# Patient Record
Sex: Male | Born: 1955
Health system: Southern US, Community
[De-identification: ages and names within clinical notes are randomized; demographics above are authoritative.]

## PROBLEM LIST (undated history)

## (undated) ENCOUNTER — Emergency Department (HOSPITAL_COMMUNITY): Admission: EM | Payer: Self-pay | Source: Home / Self Care

## (undated) DIAGNOSIS — Z973 Presence of spectacles and contact lenses: Secondary | ICD-10-CM

## (undated) DIAGNOSIS — E785 Hyperlipidemia, unspecified: Secondary | ICD-10-CM

## (undated) DIAGNOSIS — R5383 Other fatigue: Secondary | ICD-10-CM

## (undated) DIAGNOSIS — I472 Ventricular tachycardia, unspecified: Secondary | ICD-10-CM

## (undated) DIAGNOSIS — N186 End stage renal disease: Secondary | ICD-10-CM

## (undated) DIAGNOSIS — H269 Unspecified cataract: Secondary | ICD-10-CM

## (undated) DIAGNOSIS — Z87442 Personal history of urinary calculi: Secondary | ICD-10-CM

## (undated) DIAGNOSIS — I4729 Other ventricular tachycardia: Secondary | ICD-10-CM

## (undated) DIAGNOSIS — C61 Malignant neoplasm of prostate: Secondary | ICD-10-CM

## (undated) DIAGNOSIS — N2581 Secondary hyperparathyroidism of renal origin: Secondary | ICD-10-CM

## (undated) DIAGNOSIS — R0600 Dyspnea, unspecified: Secondary | ICD-10-CM

## (undated) DIAGNOSIS — I498 Other specified cardiac arrhythmias: Secondary | ICD-10-CM

## (undated) DIAGNOSIS — I428 Other cardiomyopathies: Secondary | ICD-10-CM

## (undated) DIAGNOSIS — G4733 Obstructive sleep apnea (adult) (pediatric): Secondary | ICD-10-CM

## (undated) DIAGNOSIS — J189 Pneumonia, unspecified organism: Secondary | ICD-10-CM

## (undated) DIAGNOSIS — I10 Essential (primary) hypertension: Secondary | ICD-10-CM

## (undated) DIAGNOSIS — Z9989 Dependence on other enabling machines and devices: Principal | ICD-10-CM

## (undated) DIAGNOSIS — R0902 Hypoxemia: Secondary | ICD-10-CM

## (undated) HISTORY — PX: COLONOSCOPY: SHX174

## (undated) HISTORY — PX: APPENDECTOMY: SHX54

## (undated) HISTORY — DX: Unspecified cataract: H26.9

## (undated) HISTORY — DX: Obstructive sleep apnea (adult) (pediatric): G47.33

## (undated) HISTORY — PX: CYSTOSCOPY W/ STONE MANIPULATION: SHX1427

## (undated) HISTORY — PX: TONSILLECTOMY: SUR1361

## (undated) HISTORY — DX: Hypoxemia: R09.02

## (undated) HISTORY — DX: Secondary hyperparathyroidism of renal origin: N25.81

## (undated) HISTORY — DX: Hyperlipidemia, unspecified: E78.5

## (undated) HISTORY — DX: Essential (primary) hypertension: I10

## (undated) HISTORY — DX: End stage renal disease: N18.6

## (undated) HISTORY — PX: HERNIA REPAIR: SHX51

## (undated) HISTORY — DX: Dependence on other enabling machines and devices: Z99.89

---

## 1999-02-13 ENCOUNTER — Emergency Department (HOSPITAL_COMMUNITY): Admission: EM | Admit: 1999-02-13 | Discharge: 1999-02-14 | Payer: Self-pay | Admitting: *Deleted

## 1999-12-20 ENCOUNTER — Encounter: Payer: Self-pay | Admitting: *Deleted

## 1999-12-20 ENCOUNTER — Ambulatory Visit (HOSPITAL_COMMUNITY): Admission: RE | Admit: 1999-12-20 | Discharge: 1999-12-20 | Payer: Self-pay | Admitting: *Deleted

## 2001-08-23 ENCOUNTER — Encounter: Payer: Self-pay | Admitting: Emergency Medicine

## 2001-08-23 ENCOUNTER — Emergency Department (HOSPITAL_COMMUNITY): Admission: EM | Admit: 2001-08-23 | Discharge: 2001-08-23 | Payer: Self-pay | Admitting: Emergency Medicine

## 2002-12-06 ENCOUNTER — Ambulatory Visit (HOSPITAL_COMMUNITY): Admission: RE | Admit: 2002-12-06 | Discharge: 2002-12-06 | Payer: Self-pay | Admitting: Family Medicine

## 2002-12-06 ENCOUNTER — Encounter: Payer: Self-pay | Admitting: Family Medicine

## 2003-12-24 ENCOUNTER — Ambulatory Visit (HOSPITAL_COMMUNITY): Admission: RE | Admit: 2003-12-24 | Discharge: 2003-12-24 | Payer: Self-pay | Admitting: Family Medicine

## 2003-12-24 IMAGING — CR DG CHEST 2V
2 series · 2 of 2 positions shown · non-contrast
Comparison: none

CLINICAL DATA: Chest wall pain. 
 PA AND LATERAL CHEST, [DATE]
 The lungs are clear.  Cardiac and mediastinal contours are normal.  Osseous structures unremarkable. 
 IMPRESSION
 Negative for acute cardiac or pulmonary process.

[view not recorded (1 of 2)]
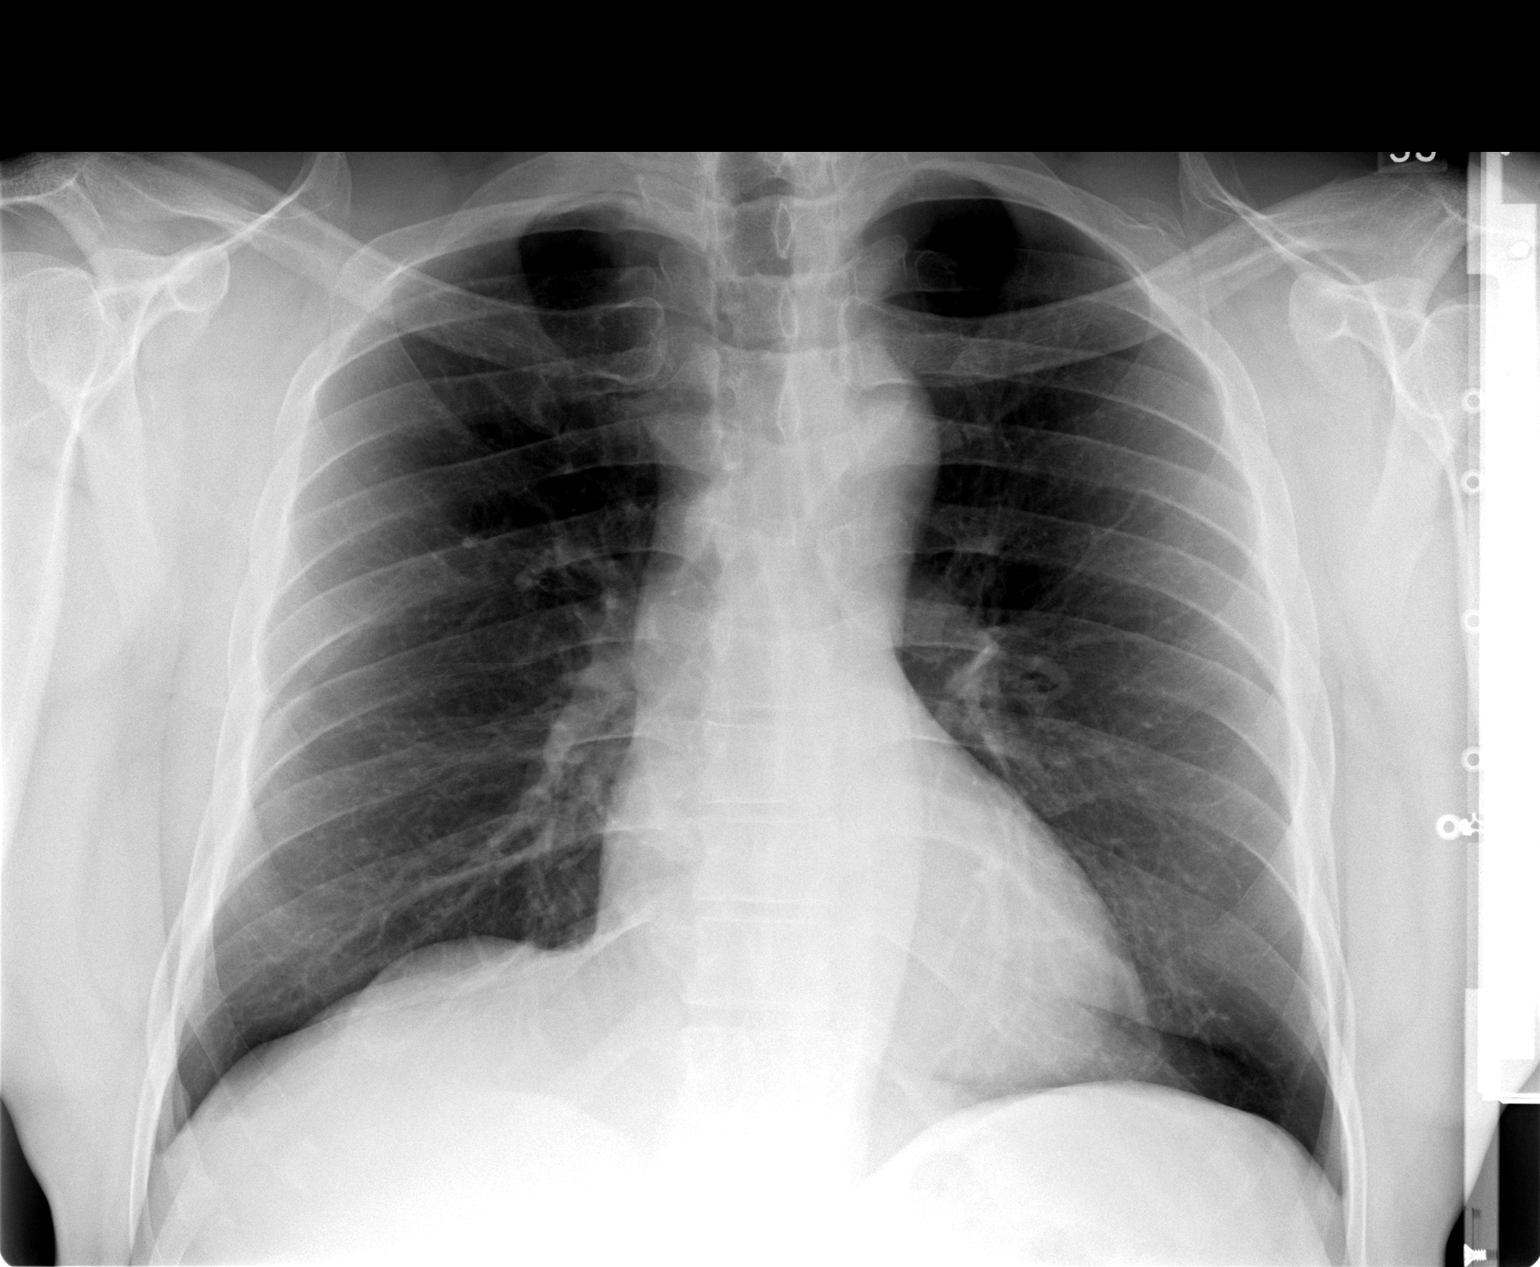

[view not recorded (2 of 2)]
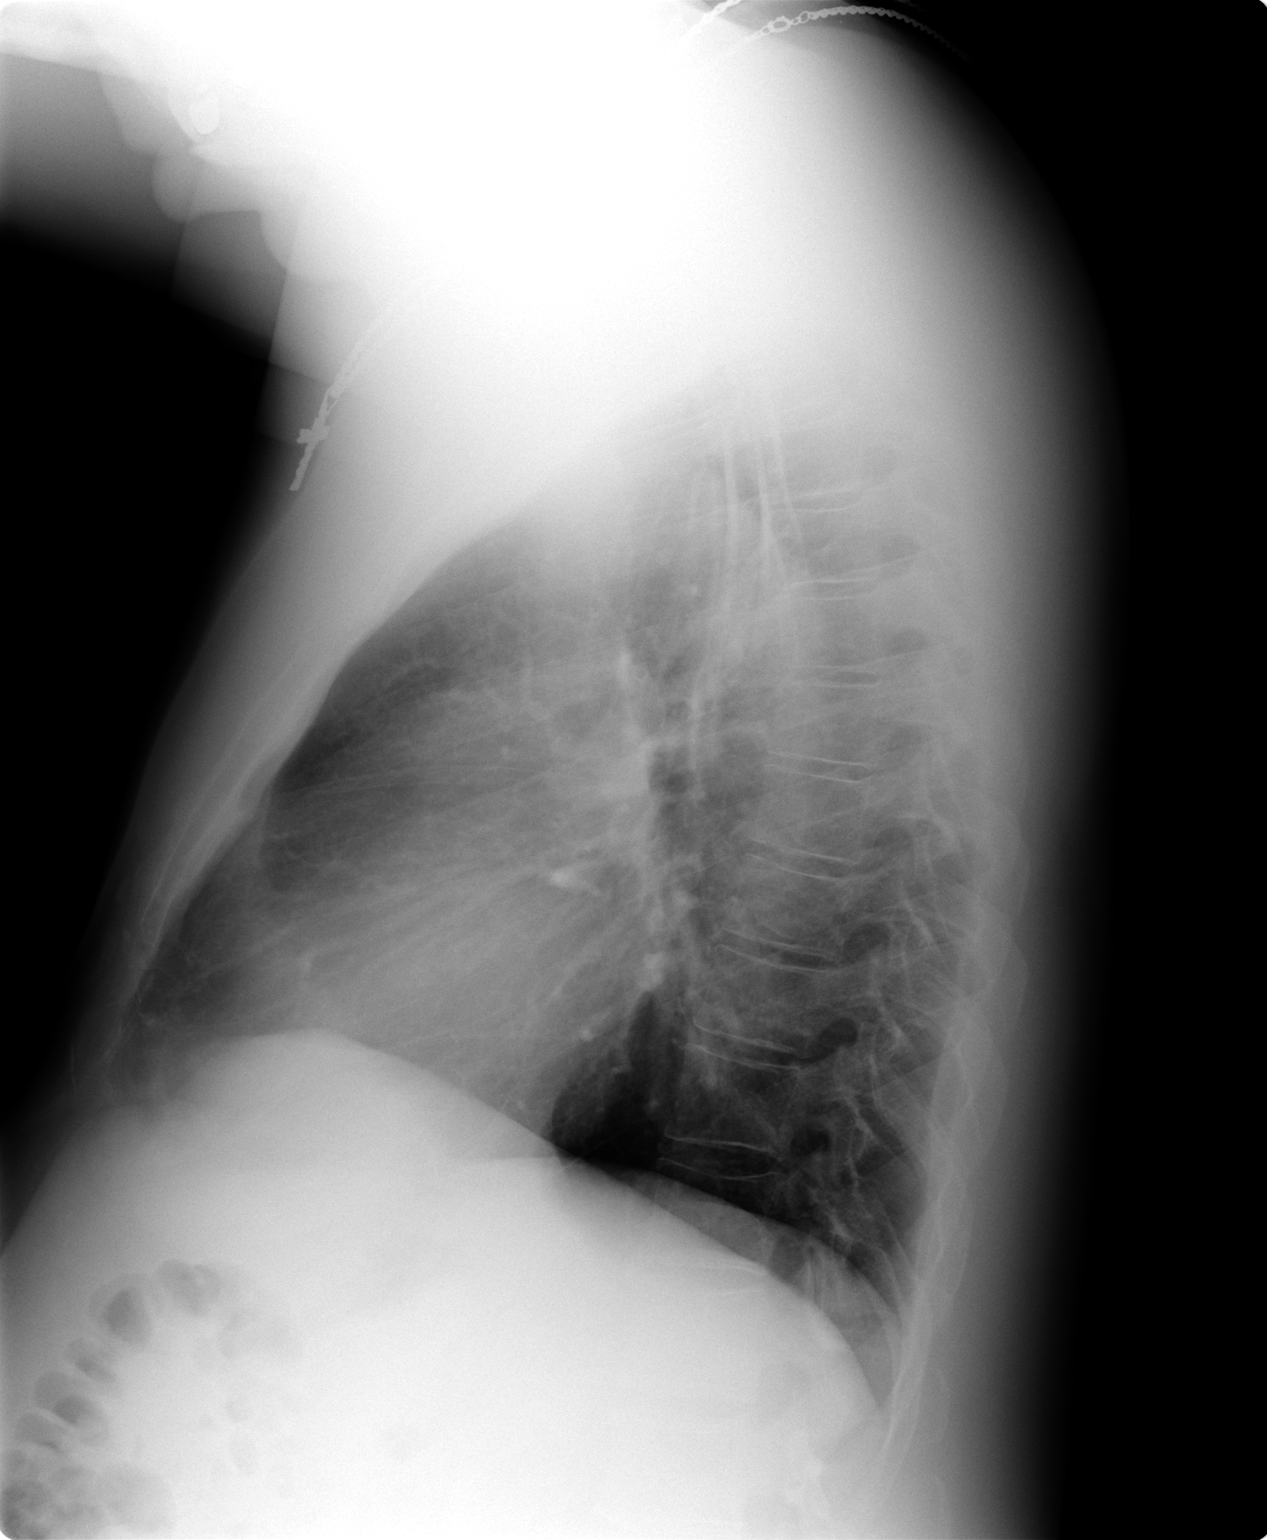

[2 of 2 positions shown; findings below may reference images not displayed]

## 2005-02-20 ENCOUNTER — Ambulatory Visit (HOSPITAL_COMMUNITY): Admission: RE | Admit: 2005-02-20 | Discharge: 2005-02-20 | Payer: Self-pay | Admitting: Internal Medicine

## 2008-04-12 ENCOUNTER — Emergency Department (HOSPITAL_COMMUNITY): Admission: EM | Admit: 2008-04-12 | Discharge: 2008-04-12 | Payer: Self-pay | Admitting: Emergency Medicine

## 2008-08-15 ENCOUNTER — Encounter (INDEPENDENT_AMBULATORY_CARE_PROVIDER_SITE_OTHER): Payer: Self-pay | Admitting: Internal Medicine

## 2008-08-15 ENCOUNTER — Observation Stay (HOSPITAL_COMMUNITY): Admission: AD | Admit: 2008-08-15 | Discharge: 2008-08-16 | Payer: Self-pay | Admitting: Internal Medicine

## 2008-08-15 ENCOUNTER — Ambulatory Visit: Payer: Self-pay | Admitting: Diagnostic Radiology

## 2008-08-15 ENCOUNTER — Encounter: Payer: Self-pay | Admitting: Emergency Medicine

## 2008-08-15 IMAGING — CT CT HEAD W/O CM
1 series · 16 of 30 positions shown, 20 images · non-contrast
Comparison: None

CLINICAL DATA: Chest pain.  Syncope.  Hit head posteriorly.

CT HEAD WITHOUT CONTRAST
TECHNIQUE: Contiguous axial images were obtained from the base of
the skull through the vertex without contrast.

[Series 2: head 4.8 h37s · axial · 0.44mm/px · z∈[-134,+2]mm · 16 of 32 slices shown, 20 images]
[im 2/32  brain]
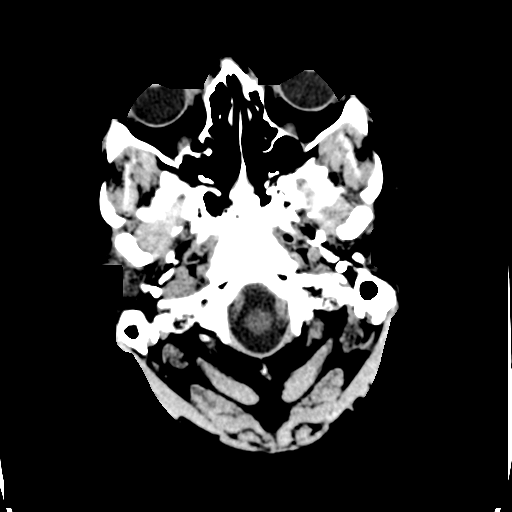
[im 2/32  bone]
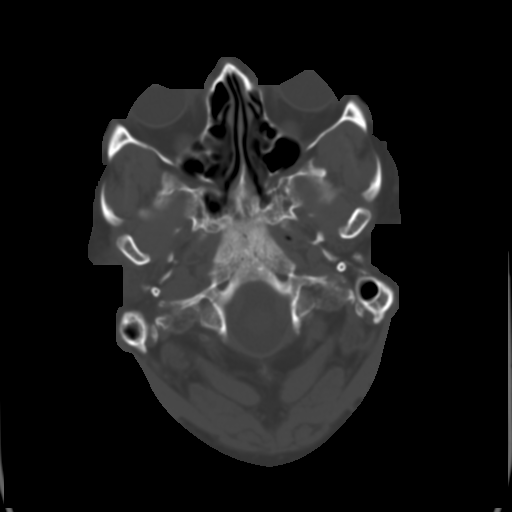
[im 4/32  brain]
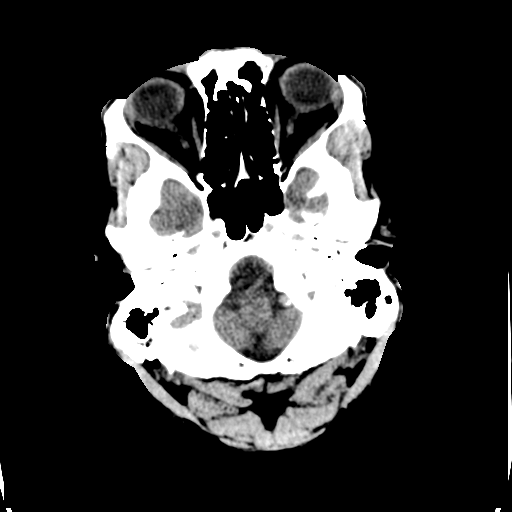
[im 6/32  brain]
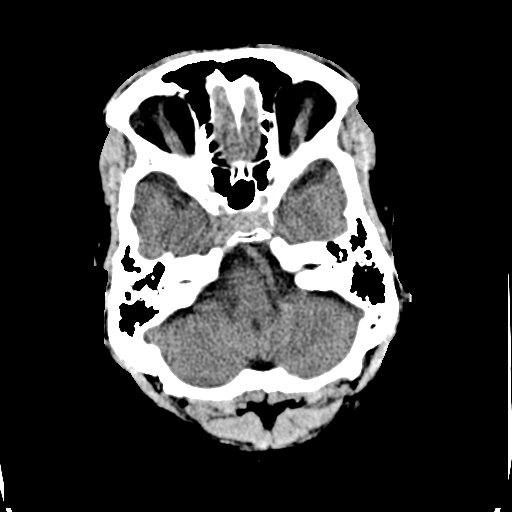
[im 8/32  brain]
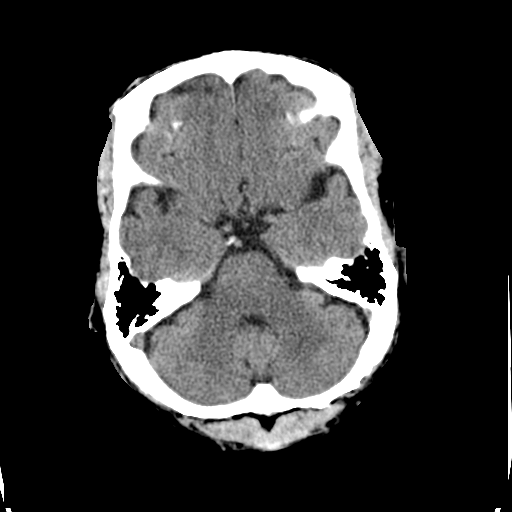
[im 9/32  brain]
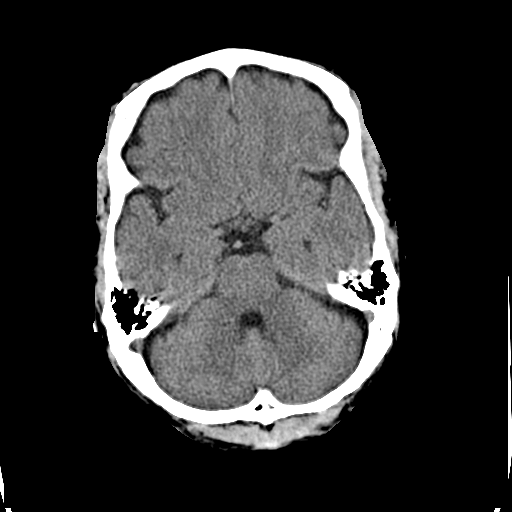
[im 9/32  bone]
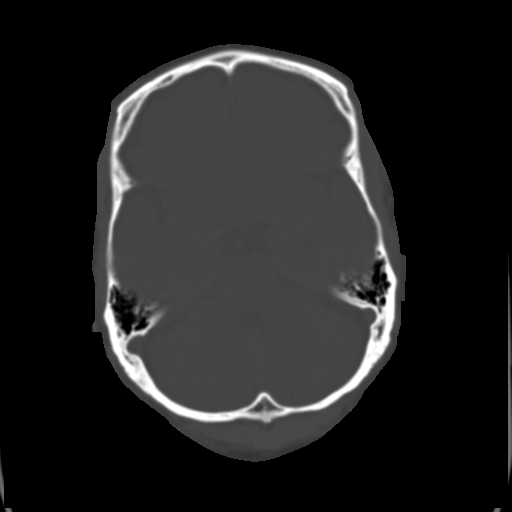
[im 11/32  brain]
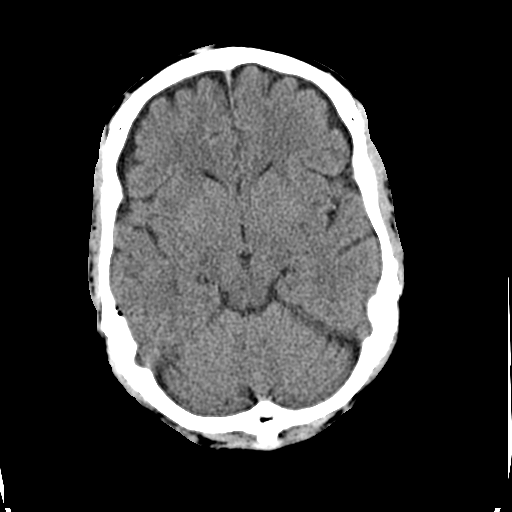
[im 13/32  brain]
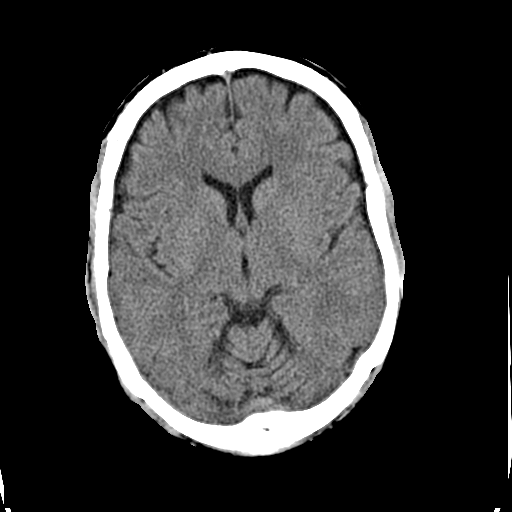
[im 15/32  brain]
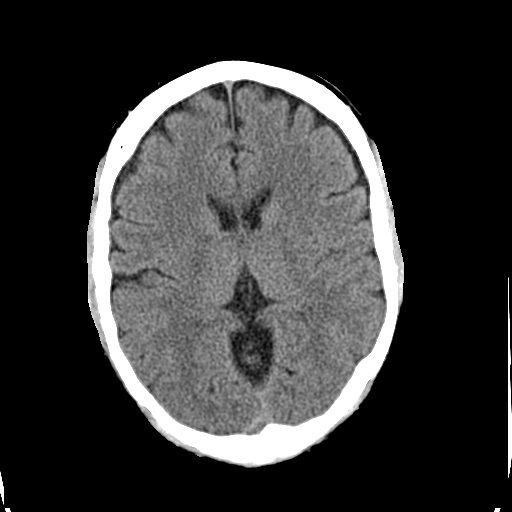
[im 17/32  brain]
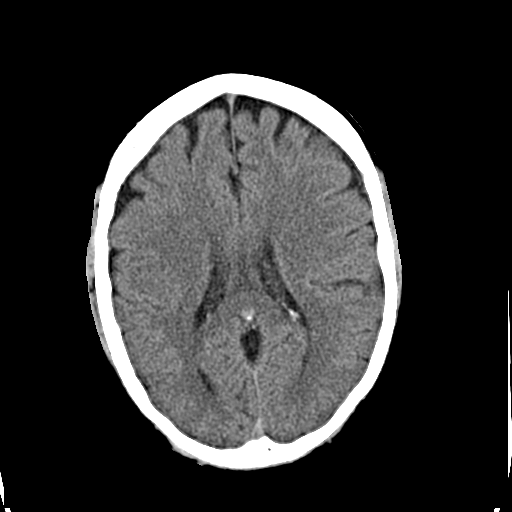
[im 17/32  bone]
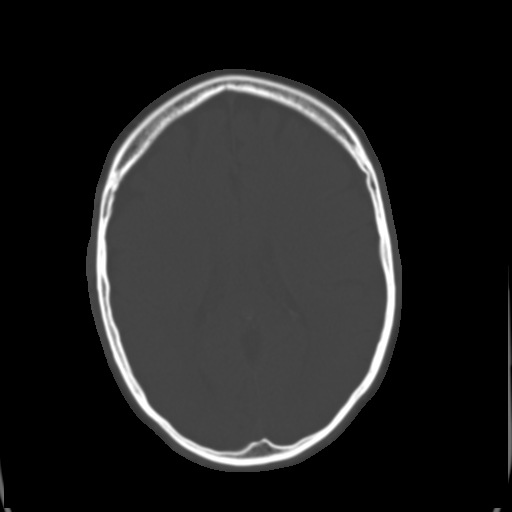
[im 19/32  brain]
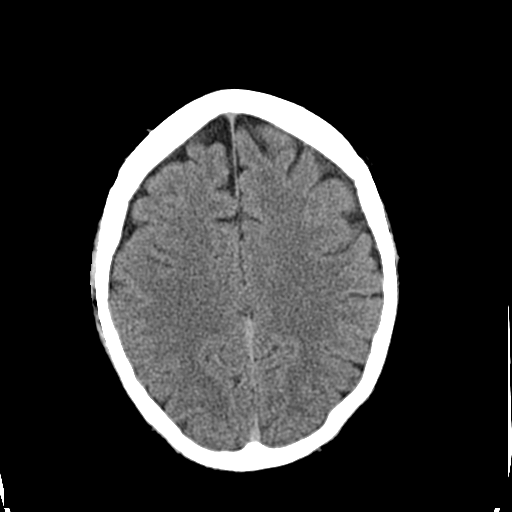
[im 21/32  brain]
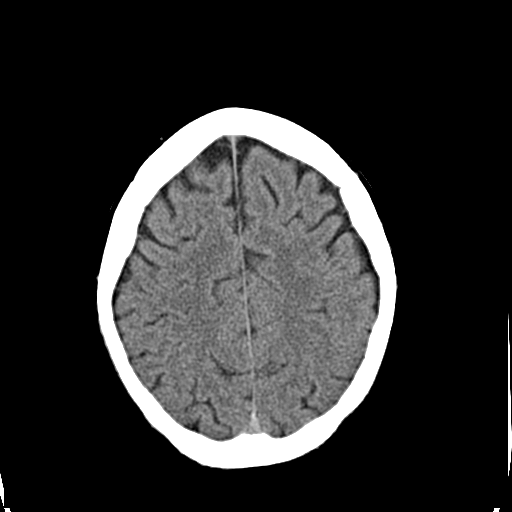
[im 23/32  brain]
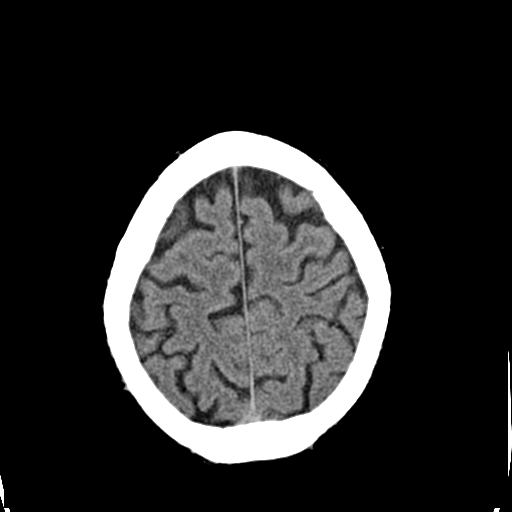
[im 24/32  brain]
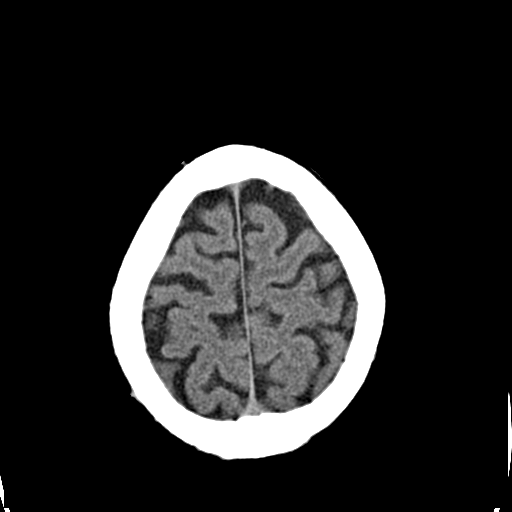
[im 24/32  bone]
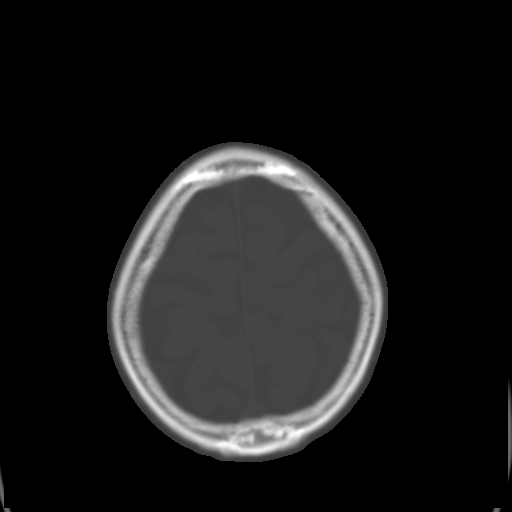
[im 26/32  brain]
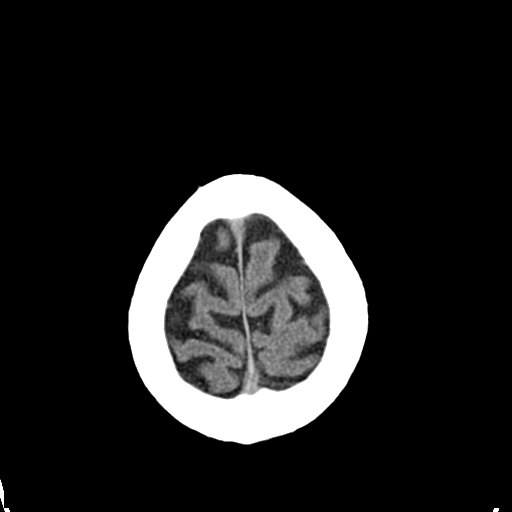
[im 28/32  brain]
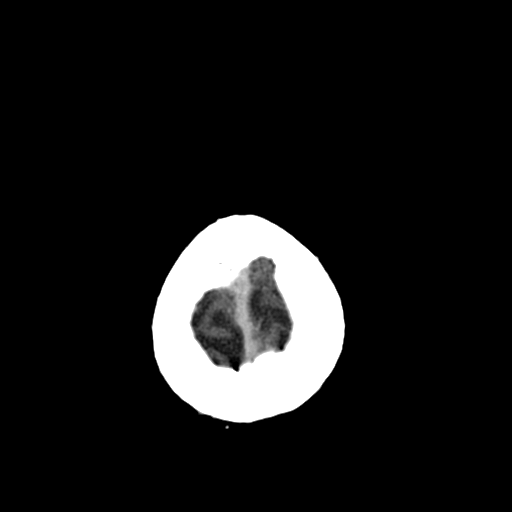
[im 30/32  brain]
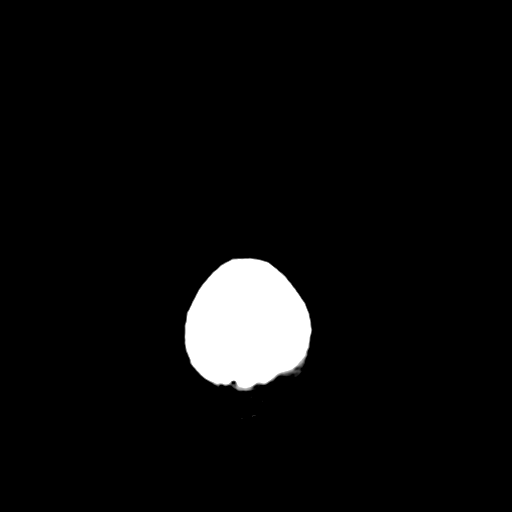

[16 of 30 positions shown; findings below may reference images not displayed]

FINDINGS: There is mild atrophy. No acute intracranial abnormality.
Specifically, no hemorrhage, hydrocephalus, mass lesion, acute
infarction, or significant intracranial injury.  No acute calvarial
abnormality.

Visualized paranasal sinuses and mastoids clear.  Orbital soft
tissues unremarkable.
IMPRESSION: No acute intracranial abnormality.

## 2010-04-05 HISTORY — PX: CARDIAC CATHETERIZATION: SHX172

## 2010-08-11 LAB — POCT CARDIAC MARKERS
CKMB, poc: 2.5 ng/mL (ref 1.0–8.0)
Myoglobin, poc: 401 ng/mL (ref 12–200)
Troponin i, poc: <0.05 ng/mL (ref 0.00–0.09)

## 2010-08-11 LAB — BASIC METABOLIC PANEL
BUN: 22 mg/dL (ref 6–23)
BUN: 27 mg/dL — ABNORMAL HIGH (ref 6–23)
CO2: 27 mEq/L (ref 19–32)
CO2: 25 mEq/L (ref 19–32)
Calcium: 9.8 mg/dL (ref 8.4–10.5)
Calcium: 10.2 mg/dL (ref 8.4–10.5)
Chloride: 106 mEq/L (ref 96–112)
Chloride: 104 mEq/L (ref 96–112)
Creatinine, Ser: 2.46 mg/dL — ABNORMAL HIGH (ref 0.4–1.5)
Creatinine, Ser: 2.9 mg/dL — ABNORMAL HIGH (ref 0.4–1.5)
GFR calc Af Amer: 34 mL/min — ABNORMAL LOW (ref >60)
GFR calc Af Amer: 28 mL/min — ABNORMAL LOW (ref >60)
GFR calc non Af Amer: 28 mL/min — ABNORMAL LOW (ref >60)
GFR calc non Af Amer: 23 mL/min — ABNORMAL LOW (ref >60)
Glucose, Bld: 91 mg/dL (ref 70–99)
Glucose, Bld: 85 mg/dL (ref 70–99)
Potassium: 4.4 mEq/L (ref 3.5–5.1)
Potassium: 4.5 mEq/L (ref 3.5–5.1)
Sodium: 138 mEq/L (ref 135–145)
Sodium: 142 mEq/L (ref 135–145)

## 2010-08-11 LAB — CARDIAC PANEL(CRET KIN+CKTOT+MB+TROPI)
CK, MB: 2.6 ng/mL (ref 0.3–4.0)
CK, MB: 2.9 ng/mL (ref 0.3–4.0)
CK, MB: 4.3 ng/mL — ABNORMAL HIGH (ref 0.3–4.0)
Relative Index: 0.7 (ref 0.0–2.5)
Relative Index: 0.7 (ref 0.0–2.5)
Relative Index: 0.8 (ref 0.0–2.5)
Total CK: 369 U/L — ABNORMAL HIGH (ref 7–232)
Total CK: 400 U/L — ABNORMAL HIGH (ref 7–232)
Total CK: 544 U/L — ABNORMAL HIGH (ref 7–232)
Troponin I: 0.02 ng/mL (ref 0.00–0.06)
Troponin I: 0.02 ng/mL (ref 0.00–0.06)
Troponin I: 0.04 ng/mL (ref 0.00–0.06)

## 2010-08-11 LAB — CBC
HCT: 41.9 % (ref 39.0–52.0)
HCT: 41.8 % (ref 39.0–52.0)
Hemoglobin: 14.2 g/dL (ref 13.0–17.0)
Hemoglobin: 14.2 g/dL (ref 13.0–17.0)
MCHC: 33.9 g/dL (ref 30.0–36.0)
MCHC: 33.9 g/dL (ref 30.0–36.0)
MCV: 87.8 fL (ref 78.0–100.0)
MCV: 86.6 fL (ref 78.0–100.0)
Platelets: 172 10*3/uL (ref 150–400)
Platelets: 193 10*3/uL (ref 150–400)
RBC: 4.77 MIL/uL (ref 4.22–5.81)
RBC: 4.83 MIL/uL (ref 4.22–5.81)
RDW: 13.6 % (ref 11.5–15.5)
RDW: 12.9 % (ref 11.5–15.5)
WBC: 6 10*3/uL (ref 4.0–10.5)
WBC: 6.4 10*3/uL (ref 4.0–10.5)

## 2010-08-11 LAB — URINALYSIS, ROUTINE W REFLEX MICROSCOPIC
Bilirubin Urine: NEGATIVE
Glucose, UA: NEGATIVE mg/dL
Hgb urine dipstick: NEGATIVE
Ketones, ur: NEGATIVE mg/dL
Leukocytes, UA: NEGATIVE
Nitrite: NEGATIVE
Protein, ur: 30 mg/dL — AB
Specific Gravity, Urine: 1.011 (ref 1.005–1.030)
Urobilinogen, UA: 0.2 mg/dL (ref 0.0–1.0)
pH: 6.5 (ref 5.0–8.0)

## 2010-08-11 LAB — URINE MICROSCOPIC-ADD ON

## 2010-08-11 LAB — POCT TOXICOLOGY PANEL

## 2010-08-11 LAB — DIFFERENTIAL
Basophils Absolute: 0 10*3/uL (ref 0.0–0.1)
Basophils Relative: 1 % (ref 0–1)
Eosinophils Absolute: 0.3 10*3/uL (ref 0.0–0.7)
Eosinophils Relative: 5 % (ref 0–5)
Lymphocytes Relative: 32 % (ref 12–46)
Lymphs Abs: 2 10*3/uL (ref 0.7–4.0)
Monocytes Absolute: 0.5 10*3/uL (ref 0.1–1.0)
Monocytes Relative: 8 % (ref 3–12)
Neutro Abs: 3.6 10*3/uL (ref 1.7–7.7)
Neutrophils Relative %: 55 % (ref 43–77)

## 2010-08-11 LAB — D-DIMER, QUANTITATIVE: D-Dimer, Quant: 0.33 ug/mL-FEU (ref 0.00–0.48)

## 2010-09-14 NOTE — H&P (Signed)
NAME:  Michael [name], HOLEMAN NO.:  [id number]   MEDICAL RECORD NO.:  [id number]          PATIENT TYPE:  INP   LOCATION:  [id number]                         FACILITY:  [facility]   PHYSICIAN:  Annita Brod, M.D.DATE OF BIRTH:  [birth year]   DATE OF ADMISSION:  08/15/2008  DATE OF DISCHARGE:                              HISTORY & PHYSICAL   PATIENT'S PCP:  Audree Camel. Alyson Ingles, M.D.   CHIEF COMPLAINT:  Chest pain and syncope.   HISTORY OF PRESENT ILLNESS:  The patient is a 55 year old African  American male with past medical history of hypertension, chronic renal  insufficiency and hyperlipidemia who has been in a good state of health.  No previous chest pain or cardiac issues when today he was sitting at  work, no physical activity and all of a sudden he started having some  what he described as a chest pressure that was located over is left  breast.  Essentially, no other radiation, nontender.  He had no  associated shortness of breath, but he did feel very lightheaded and  broke out into a sweat.  He got up and walked into another room and then  sat down in the other room at work.  The next thing he knew he said he  passed out he did not have any warning that he was eating lightheaded  was going to pass out.  He said he was sitting in the room and he  started having a little chest pressure and then came to on the floor.  According to coworkers he was out for a few seconds.  There was no  report of any shaking like activity suggesting seizure.  He said that  when he woke up his chest pain was completely gone and has been gone  ever since.  With these findings, obviously see the patient was then  sent over to the emergency room.  In the emergency room the patient was  evaluated.  He was found to have normal cardiac markers.  His blood  pressure was stable at 135/92, heart rate 80, respirations 14, O2 SAT  100% on room air.  Labs were drawn.  He was found to have a BUN at 27  and  creatinine of 2.9 and it was slightly up from his baseline.  A white  count of 6.4, but no shift.  The rest of his labs were unremarkable with  normal urine drug screen and the patient's EKG was reportedly  unremarkable, except for some PVCs.  However, I do not see on it the  chart.  His chest x-ray was reportedly unremarkable as well.  D-dimer  within normal limits.  The patient was assessed further and given his  syncope and chest pain it was felt best that he come in for further  evaluation and treatment.  He was transferred over from the [place] to [facility].   Past Medical History:  1.Hypertension  2.Hyperlipidemia  3.Chronic Renal insufficiency   Medications:  1.Micardis 80mg  QHS  1. Metoprolol 100mg  daily  2. Spironolactone 25mg  daily  3. Simvastatin 40mg   daily  4. Norvasc 5mg  daily  5. Zemplar 41mcg daily   Allergies:  Lipitor (rash) althoguh he is on Zocor   Social history:  No tobacco, alcohol or drug use.   FAMILY HISTORY:  His mom did have a CABG.   When I saw the patient he was doing okay, alert and oriented x3 in no  apparent distress.  HEENT:  Normocephalic atraumatic.  Mucous membranes are moist.  He has no carotid bruits.  HEART:  Regular rate and rhythm, S1 and S2.  LUNGS:  Clear to auscultation bilaterally.  ABDOMEN:  Soft, nontender, nondistended.  Positive bowel sounds.  EXTREMITIES:  Show no clubbing, cyanosis or edema.   LAB WORK:  His CT scan of his head shows no acute intracranial  abnormality.  Reportedly according to the ER note, his 12-lead EKG  initially showed normal sinus rhythm.  Second EKG showed occasional  PVCs.  CPK 401, MB 2.5, troponin-I less than 0.05.  Urinalysis  completely normal only noting rare bacteria, 0-2 white cells.  White  count 6.4, H and H of  14 and 42, MCV of 87, platelet count 193, no  shift.  Sodium 142, potassium 4.5, chloride 104, bicarb 25, BUN 27,  creatinine 2.9, glucose 85.  Urine  drug screen negative.  D-dimer 0.33  which is in the normal range.   ASSESSMENT/PLAN:  1. Chest pain.  The patient does have a number of risk factors      including hypertension, hyperlipidemia, slightly overweight.  We      will plan to check serial sets of cardiac markers, leave on      telemetry and we will possibly get an outpatient stress test.  We      will also check a 2-D echo with [facility] to read.  2. Syncope.  It is difficult to say the etiology of it.  Continue to      monitor telemetry.  The rest of his labs essentially unremarkable.      It is possible that with this episode of chest discomfort he became      slightly dehydrated and then when he stood and then sat back down      he became orthostatic and passed out very briefly.  He was given IV      fluids on route and currently his orthostatics are normal upon      arrival to [facility].  3. Hypertension.  Holding meds for now and continue to follow.  4. Chronic renal insufficiency.  A slight increase from baseline.  We      will follow.  5. History of hyperlipidemia.  Stable.      Annita Brod, M.D.  Electronically Signed     SKK/MEDQ  D:  08/15/2008  T:  08/15/2008  Job:  [id number]   cc:   Herbie Baltimore A. Alyson Ingles, M.D.  Fax: [phone]

## 2011-04-06 DIAGNOSIS — I10 Essential (primary) hypertension: Secondary | ICD-10-CM | POA: Insufficient documentation

## 2011-11-07 ENCOUNTER — Other Ambulatory Visit: Payer: Self-pay

## 2011-11-07 DIAGNOSIS — Z01810 Encounter for preprocedural cardiovascular examination: Secondary | ICD-10-CM

## 2011-11-07 DIAGNOSIS — N184 Chronic kidney disease, stage 4 (severe): Secondary | ICD-10-CM

## 2011-11-14 ENCOUNTER — Encounter: Payer: Self-pay | Admitting: Vascular Surgery

## 2011-11-24 ENCOUNTER — Encounter: Payer: Self-pay | Admitting: Vascular Surgery

## 2011-11-25 ENCOUNTER — Ambulatory Visit (INDEPENDENT_AMBULATORY_CARE_PROVIDER_SITE_OTHER): Payer: BC Managed Care – PPO | Admitting: Vascular Surgery

## 2011-11-25 ENCOUNTER — Encounter: Payer: Self-pay | Admitting: Vascular Surgery

## 2011-11-25 ENCOUNTER — Encounter: Payer: Self-pay | Admitting: *Deleted

## 2011-11-25 ENCOUNTER — Other Ambulatory Visit: Payer: Self-pay | Admitting: *Deleted

## 2011-11-25 ENCOUNTER — Encounter (INDEPENDENT_AMBULATORY_CARE_PROVIDER_SITE_OTHER): Payer: BC Managed Care – PPO | Admitting: *Deleted

## 2011-11-25 VITALS — BP 143/94 | HR 65 | Resp 18 | Ht 69.0 in | Wt 192.5 lb

## 2011-11-25 DIAGNOSIS — N184 Chronic kidney disease, stage 4 (severe): Secondary | ICD-10-CM

## 2011-11-25 DIAGNOSIS — Z01810 Encounter for preprocedural cardiovascular examination: Secondary | ICD-10-CM

## 2011-11-25 NOTE — Progress Notes (Signed)
[facility] [place]  Referred by:  Louis Meckel, MD [place], [place] [id number]  Reason for referral: New access  History of Present Illness  Michael [name] is a 56 y.o. (1955/09/11) male who presents for evaluation for permanent access.  The patient is right hand dominant.  The patient has not had previous access procedures.  Previous central venous cannulation procedures include: none.  The patient has never had a PPM placed. Pt has been told he has ~15% residual kidney function.  Past Medical History  Diagnosis Date  . Chronic kidney disease     Polycystic kidney disease  . Hypertension   . Arthritis     gout  . Hyperparathyroidism, secondary renal     Past Surgical History  Procedure Date  . Appendectomy   . Cardiac catheterization 04-05-2010    History   Social History  . Marital Status: Single    Spouse Name: N/A    Number of Children: N/A  . Years of Education: N/A   Occupational History  . Not on file.   Social History Main Topics  . Smoking status: Never Smoker   . Smokeless tobacco: Not on file  . Alcohol Use: No  . Drug Use: No  . Sexually Active: Not on file   Other Topics Concern  . Not on file   Social History Narrative  . No narrative on file    Family History  Problem Relation Age of Onset  . Heart disease Mother   . Hyperlipidemia Mother   . Hypertension Mother   . Kidney disease Father   . Kidney disease Brother     Current Outpatient Prescriptions on File Prior to Visit  Medication Sig Dispense Refill  . allopurinol (ZYLOPRIM) 100 MG tablet Take 100 mg by mouth daily.      [name] amLODipine (NORVASC) 5 MG tablet Take 5 mg by mouth daily.      . metoprolol succinate (TOPROL-XL) 100 MG 24 hr tablet Take 100 mg by mouth daily. Take with or immediately following a meal.      . paricalcitol (ZEMPLAR) 1 MCG capsule Take 1 mcg by mouth daily.      . simvastatin (ZOCOR) 40 MG tablet Take 40 mg by mouth every  evening.      [name] spironolactone (ALDACTONE) 25 MG tablet Take 25 mg by mouth daily.      [name] telmisartan (MICARDIS) 80 MG tablet Take 80 mg by mouth daily.      . colchicine 0.6 MG tablet Take 0.6 mg by mouth as needed.        Allergies  Allergen Reactions  . Lipitor (Atorvastatin)     Leg pain  . Uloric (Febuxostat)     Leg pain    REVIEW OF SYSTEMS:  (Positives indicated with an "x", otherwise negative)  CARDIOVASCULAR: [ ]  chest pain    [ ]  chest pressure    [ ]  palpitations    [ ]  orthopnea   [ ]  dyspnea on exert. [ ]  claudication    [ ]  rest pain     [ ]  DVT     [ ]  phlebitis  PULMONARY:    [ ]  productive cough [ ]  asthma  [x]  wheezing  NEUROLOGIC:    [ x weakness    [ ]  paresthesias   [ ]  aphasia    [ ]  amaurosis    [ ]  dizziness  HEMATOLOGIC:    [ ]  bleeding problems  [ ]   clotting disorders  MUSCULOSKEL: [ ]  joint pain     [ ]  joint swelling  GASTROINTEST:  [ ]   blood in stool   [ ]   hematemesis  GENITOURINARY:   [ ]   dysuria    [ ]   hematuria  PSYCHIATRIC:   [ ]  history of major depression  INTEGUMENTARY: [x]  rashes    [ ]  ulcers  CONSTITUTIONAL:  [ ]  fever     [ ]  chills  Physical Examination  Filed Vitals:   11/25/11 1329  BP: 143/94  Pulse: 65  Resp: 18  Height: 5\' 9"  (1.753 m)  Weight: 192 lb 8 oz (87.317 kg)  SpO2: 99%   Body mass index is 28.43 kg/(m^2).  General: A&O x 3, WDWN  Head: [place]/AT  Ear/Nose/Throat: Hearing grossly intact, nares w/o erythema or drainage, oropharynx w/o Erythema/Exudate  Eyes: PERRLA, EOMI  Neck: Supple, no nuchal rigidity, no palpable LAD  Pulmonary: Sym exp, good air movt, CTAB, no rales, rhonchi, & wheezing  Cardiac: RRR, Nl S1, S2, no Murmurs, rubs or gallops  Vascular: Vessel Right Left  Radial Palpable Palpable  Brachial Palpable Palpable  Carotid Palpable, without bruit Palpable, without bruit  Aorta Non-palpable N/A  Femoral Palpable Palpable  Popliteal Non-palpable Non-palpable  PT Palpable Palpable    DP Palpable Palpable   Gastrointestinal: soft, NTND, -G/R, - HSM, - masses, - CVAT B  Musculoskeletal: M/S 5/5 throughout , Extremities without ischemic changes   Neurologic: CN 2-12 intact , Pain and light touch intact in extremities , Motor exam as listed above  Psychiatric: Judgment intact, Mood & affect appropriate for pt's clinical situation  Dermatologic: See M/S exam for extremity exam, no rashes otherwise noted  Lymph : No Cervical, Axillary, or Inguinal lymphadenopathy   Non-Invasive Vascular Imaging  Vein Mapping  (Date: 11/25/11):   R arm: acceptable vein conduits include entire cephalic and upper arm basilic  L arm: acceptable vein conduits include entire cephalic and upper arm basilic  Outside Studies/Documentation 5 pages of outside documents were reviewed including: nephrology clinic chart.  Medical Decision Making  Michael [name] is a 56 y.o. male who presents with chronic kidney disease stage IV   Based on vein mapping and examination, this patient's permanent access options include: left RC vs BC AVF  I had an extensive discussion with this patient in regards to the nature of access surgery, including risk, benefits, and alternatives.    The patient is aware that the risks of access surgery include but are not limited to: bleeding, infection, steal syndrome, nerve damage, ischemic monomelic neuropathy, failure of access to mature, and possible need for additional access procedures in the future.  The patient has agreed to proceed with the above procedure which will be scheduled 5 AUG 13.  Adele Barthel, MD [facility] [place] Office: [phone] Pager: [phone]  11/25/2011, 3:12 PM

## 2011-11-30 ENCOUNTER — Ambulatory Visit: Payer: Self-pay | Admitting: Vascular Surgery

## 2011-12-02 NOTE — Progress Notes (Signed)
Numerous calls made to patients home number- no answer.  I called work number and was told that he is due in at midnight. Unable to do SDW.

## 2011-12-02 NOTE — Procedures (Unsigned)
CEPHALIC VEIN MAPPING  INDICATION:  Chronic kidney disease stage IV.  HISTORY:  EXAM: The right cephalic vein is compressible with diameter measurements ranging from 0.23 to 0.65 cm.  The right basilic vein is compressible with diameter measurements ranging from 0.24 to 0.49 cm.  The left cephalic vein is compressible with diameter measurements ranging from 0.28 to 0.44 cm.  The left basilic vein is compressible with diameter measurements ranging from 0.35 to 0.45 cm.  See attached worksheet for all measurements.  IMPRESSION:  Patent bilateral cephalic and basilic veins with diameter measurements as described above.  ___________________________________________ Conrad Torrance, MD  CH/MEDQ  D:  11/28/2011  T:  11/28/2011  Job:  [id number]

## 2011-12-04 MED ORDER — DEXTROSE 5 % IV SOLN
1.5000 g | INTRAVENOUS | Status: AC
  Administered 2011-12-05: 1.5 g via INTRAVENOUS
  Filled 2011-12-04: qty 1.5

## 2011-12-05 ENCOUNTER — Ambulatory Visit (HOSPITAL_COMMUNITY): Payer: BC Managed Care – PPO | Admitting: Anesthesiology

## 2011-12-05 ENCOUNTER — Telehealth: Payer: Self-pay | Admitting: Vascular Surgery

## 2011-12-05 ENCOUNTER — Encounter (HOSPITAL_COMMUNITY)
Admission: RE | Disposition: A | Discharge status: Home / Self Care | Payer: Self-pay | Source: Ambulatory Visit | Attending: Vascular Surgery

## 2011-12-05 ENCOUNTER — Encounter (HOSPITAL_COMMUNITY): Payer: Self-pay | Admitting: Anesthesiology

## 2011-12-05 ENCOUNTER — Ambulatory Visit (HOSPITAL_COMMUNITY)
Admission: RE | Admit: 2011-12-05 | Discharge: 2011-12-05 | Disposition: A | Discharge status: Home / Self Care | Payer: BC Managed Care – PPO | Source: Ambulatory Visit | Attending: Vascular Surgery | Admitting: Vascular Surgery

## 2011-12-05 ENCOUNTER — Ambulatory Visit (HOSPITAL_COMMUNITY): Payer: BC Managed Care – PPO

## 2011-12-05 ENCOUNTER — Encounter (HOSPITAL_COMMUNITY): Payer: Self-pay | Admitting: *Deleted

## 2011-12-05 DIAGNOSIS — Q613 Polycystic kidney, unspecified: Secondary | ICD-10-CM | POA: Insufficient documentation

## 2011-12-05 DIAGNOSIS — N184 Chronic kidney disease, stage 4 (severe): Secondary | ICD-10-CM | POA: Insufficient documentation

## 2011-12-05 DIAGNOSIS — N2581 Secondary hyperparathyroidism of renal origin: Secondary | ICD-10-CM | POA: Insufficient documentation

## 2011-12-05 DIAGNOSIS — I129 Hypertensive chronic kidney disease with stage 1 through stage 4 chronic kidney disease, or unspecified chronic kidney disease: Secondary | ICD-10-CM | POA: Insufficient documentation

## 2011-12-05 HISTORY — PX: AV FISTULA PLACEMENT: SHX1204

## 2011-12-05 LAB — POCT I-STAT 4, (NA,K, GLUC, HGB,HCT)
Glucose, Bld: 87 mg/dL (ref 70–99)
HCT: 43 % (ref 39.0–52.0)
Hemoglobin: 14.6 g/dL (ref 13.0–17.0)
Potassium: 4.4 mEq/L (ref 3.5–5.1)
Sodium: 142 mEq/L (ref 135–145)

## 2011-12-05 LAB — SURGICAL PCR SCREEN
MRSA, PCR: NEGATIVE
Staphylococcus aureus: POSITIVE — AB

## 2011-12-05 IMAGING — CR DG CHEST 2V
2 series · 2 of 2 positions shown · non-contrast
Comparison: [DATE]

CLINICAL DATA: Preoperative for new AV fistula.  Hypertension.

CHEST - 2 VIEW

[view not recorded (1 of 2)]
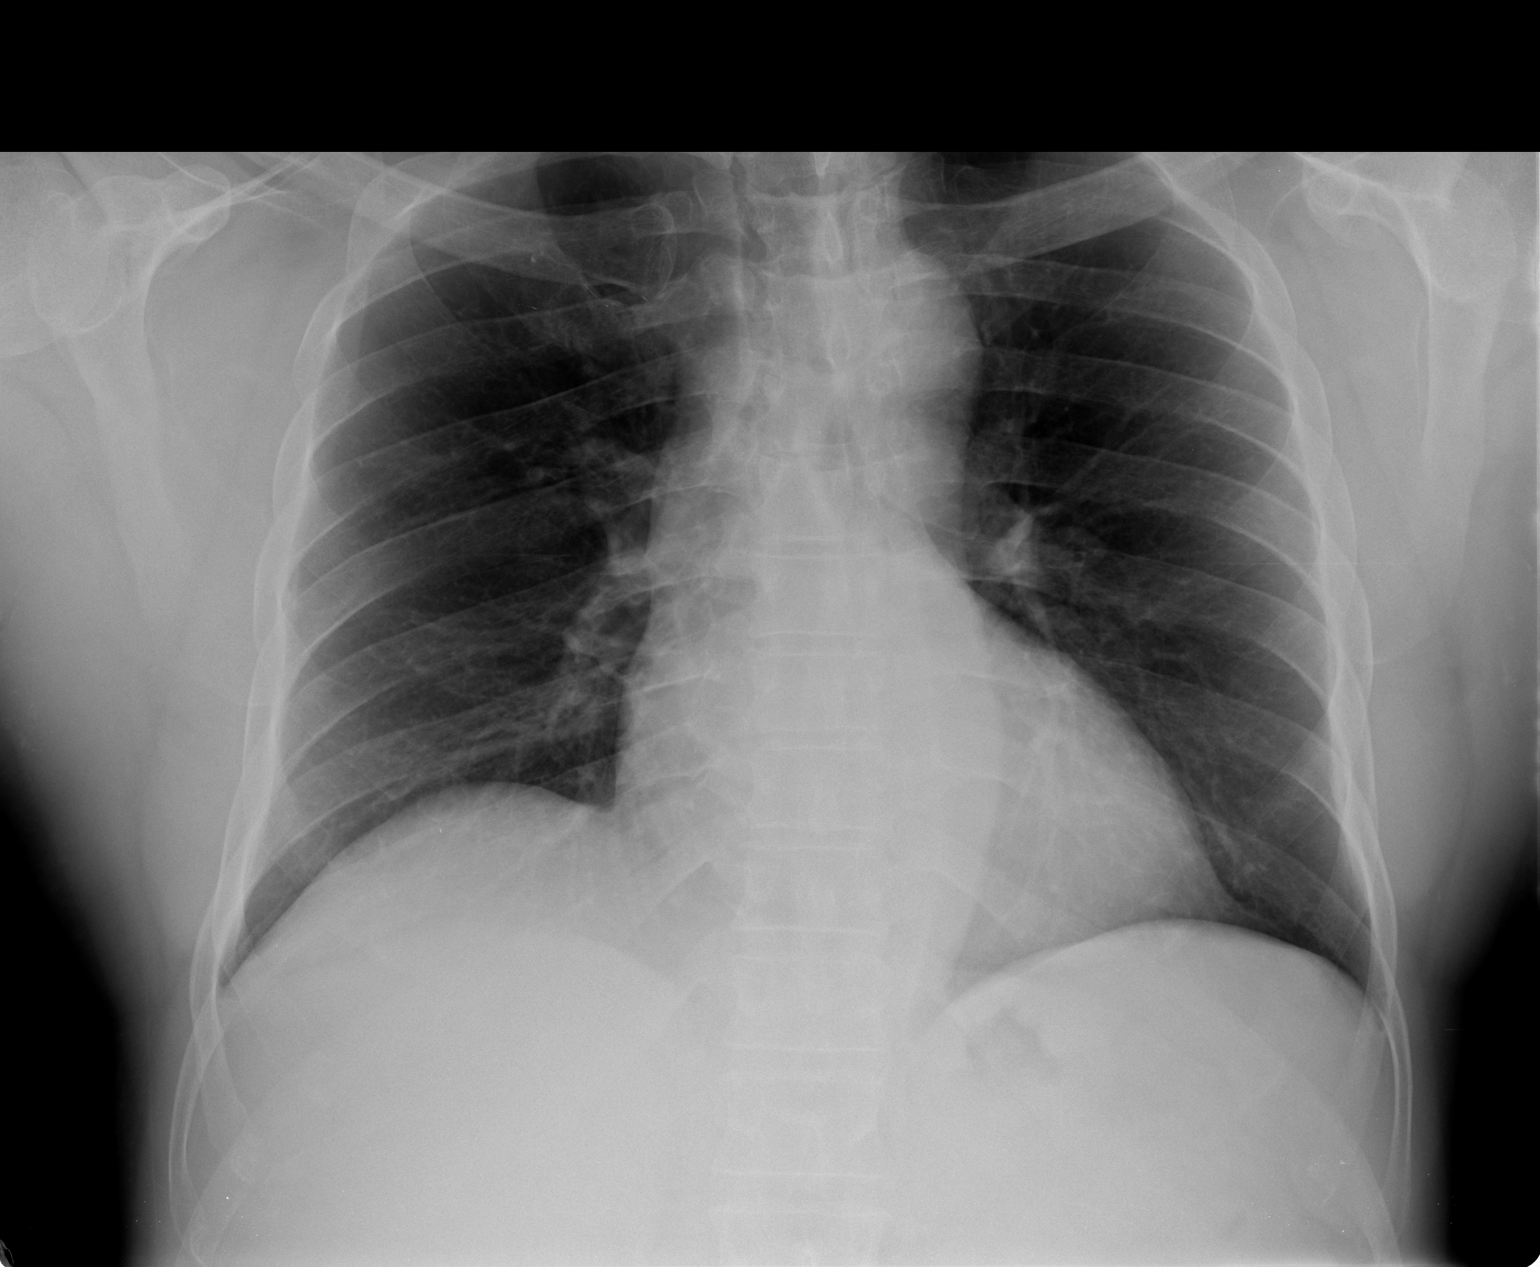

[view not recorded (2 of 2)]
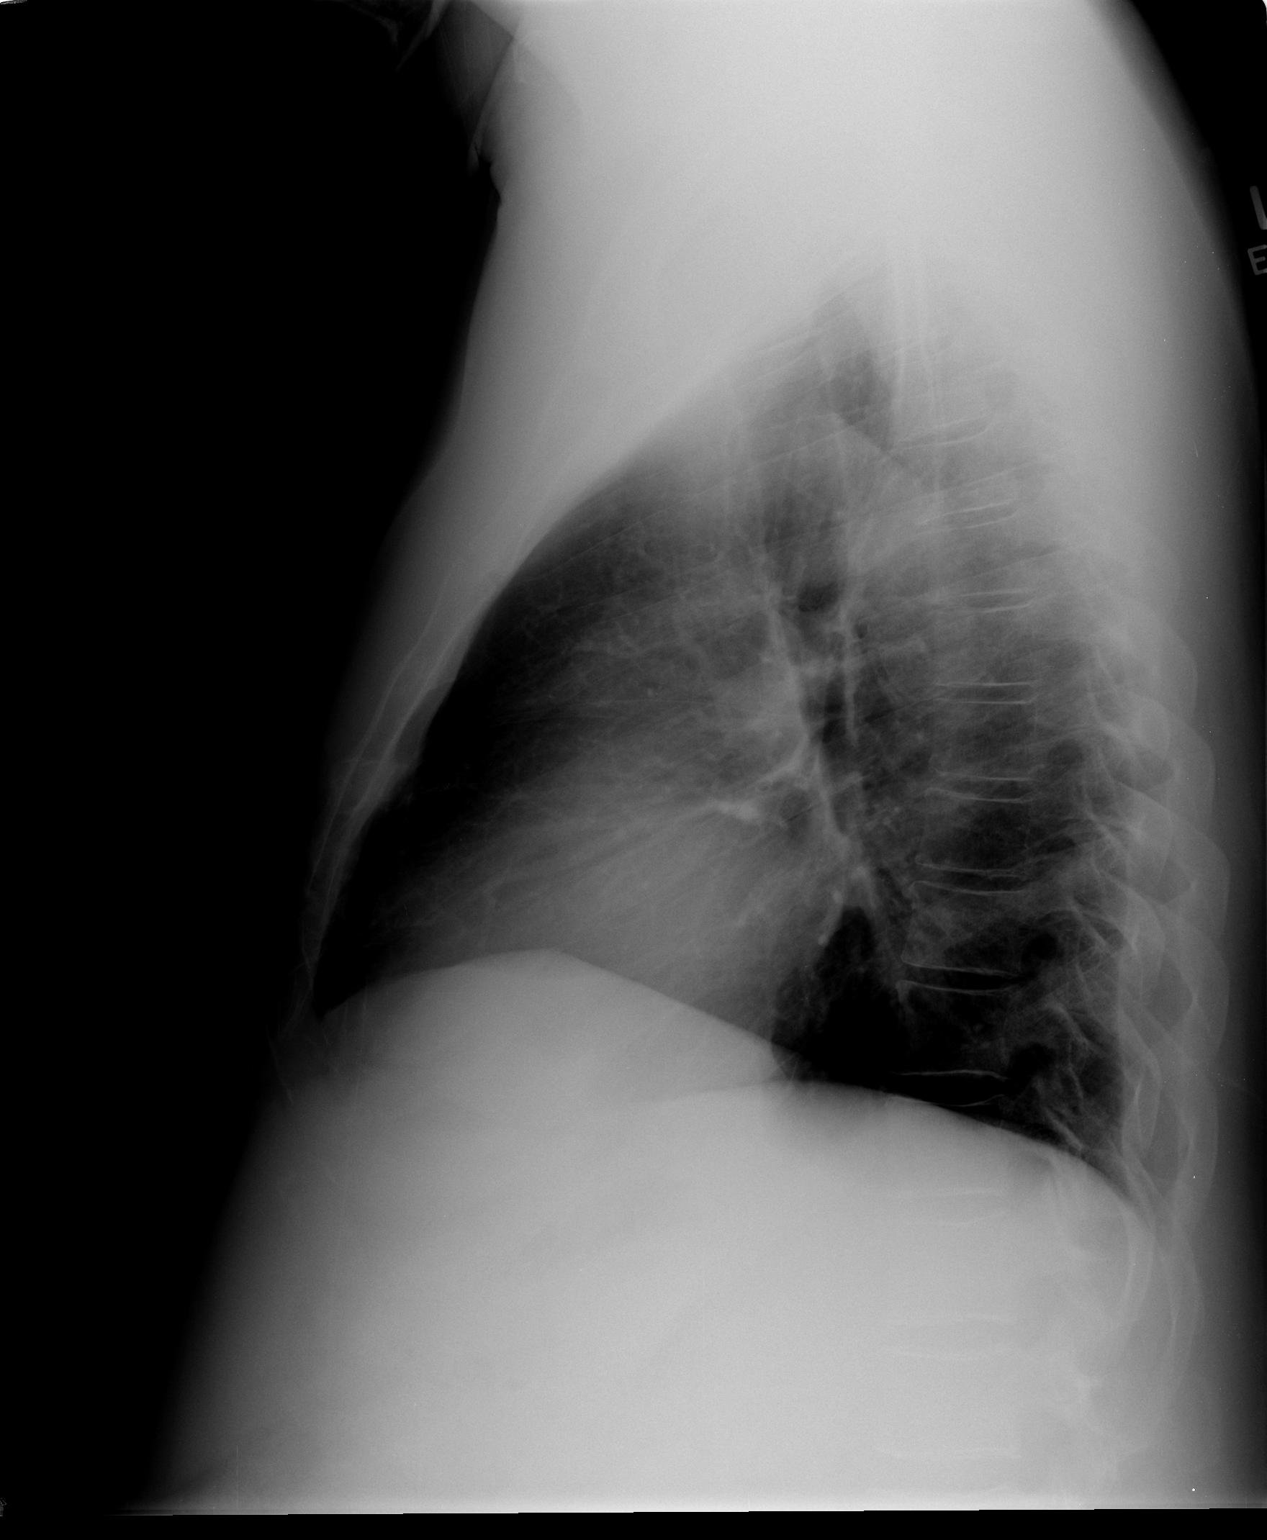

[2 of 2 positions shown; findings below may reference images not displayed]

FINDINGS: Shallow inspiration. The heart size and pulmonary
vascularity are normal. The lungs appear clear and expanded without
focal air space disease or consolidation. No blunting of the
costophrenic angles.  Tortuous aorta.  No pneumothorax.  No
significant change since previous study.
IMPRESSION: No evidence of active pulmonary disease.

## 2011-12-05 SURGERY — ARTERIOVENOUS (AV) FISTULA CREATION
Anesthesia: Monitor Anesthesia Care | Site: Arm Lower | Laterality: Left | Wound class: Clean

## 2011-12-05 MED ORDER — PROPOFOL 10 MG/ML IV EMUL
INTRAVENOUS | Status: DC | PRN
  Administered 2011-12-05: 25 ug/kg/min via INTRAVENOUS

## 2011-12-05 MED ORDER — PROMETHAZINE HCL 25 MG/ML IJ SOLN
INTRAMUSCULAR | Status: AC
  Filled 2011-12-05: qty 1

## 2011-12-05 MED ORDER — BUPIVACAINE HCL (PF) 0.5 % IJ SOLN
INTRAMUSCULAR | Status: DC | PRN
  Administered 2011-12-05: 3 mL

## 2011-12-05 MED ORDER — LIDOCAINE-EPINEPHRINE (PF) 1 %-1:200000 IJ SOLN
INTRAMUSCULAR | Status: DC | PRN
  Administered 2011-12-05: 3 mL

## 2011-12-05 MED ORDER — BUPIVACAINE HCL (PF) 0.5 % IJ SOLN
INTRAMUSCULAR | Status: AC
  Filled 2011-12-05: qty 30

## 2011-12-05 MED ORDER — 0.9 % SODIUM CHLORIDE (POUR BTL) OPTIME
TOPICAL | Status: DC | PRN
  Administered 2011-12-05: 1000 mL

## 2011-12-05 MED ORDER — THROMBIN 20000 UNITS EX SOLR
CUTANEOUS | Status: AC
  Filled 2011-12-05: qty 20000

## 2011-12-05 MED ORDER — SODIUM CHLORIDE 0.9 % IV SOLN
INTRAVENOUS | Status: DC | PRN
  Administered 2011-12-05: 07:00:00 via INTRAVENOUS

## 2011-12-05 MED ORDER — LIDOCAINE-EPINEPHRINE (PF) 1 %-1:200000 IJ SOLN
INTRAMUSCULAR | Status: AC
  Filled 2011-12-05: qty 10

## 2011-12-05 MED ORDER — MEPERIDINE HCL 25 MG/ML IJ SOLN: 6.2500 mg | INTRAMUSCULAR | Status: DC | PRN

## 2011-12-05 MED ORDER — FENTANYL CITRATE 0.05 MG/ML IJ SOLN
INTRAMUSCULAR | Status: DC | PRN
  Administered 2011-12-05: 25 ug via INTRAVENOUS
  Administered 2011-12-05: 50 ug via INTRAVENOUS
  Administered 2011-12-05 (×3): 25 ug via INTRAVENOUS

## 2011-12-05 MED ORDER — OXYCODONE HCL 5 MG PO TABS: 5.0000 mg | ORAL_TABLET | ORAL | Status: AC | PRN

## 2011-12-05 MED ORDER — SODIUM CHLORIDE 0.9 % IR SOLN
Status: DC | PRN
  Administered 2011-12-05: 08:00:00

## 2011-12-05 MED ORDER — MUPIROCIN 2 % EX OINT
TOPICAL_OINTMENT | Freq: Two times a day (BID) | CUTANEOUS | Status: DC
  Filled 2011-12-05: qty 22

## 2011-12-05 MED ORDER — SODIUM CHLORIDE 0.9 % IV SOLN: INTRAVENOUS | Status: DC

## 2011-12-05 MED ORDER — MIDAZOLAM HCL 5 MG/5ML IJ SOLN
INTRAMUSCULAR | Status: DC | PRN
  Administered 2011-12-05 (×2): 1 mg via INTRAVENOUS

## 2011-12-05 MED ORDER — HYDROMORPHONE HCL PF 1 MG/ML IJ SOLN: 0.2500 mg | INTRAMUSCULAR | Status: DC | PRN

## 2011-12-05 MED ORDER — ONDANSETRON HCL 4 MG/2ML IJ SOLN
INTRAMUSCULAR | Status: DC | PRN
  Administered 2011-12-05: 4 mg via INTRAVENOUS

## 2011-12-05 MED ORDER — MUPIROCIN 2 % EX OINT
TOPICAL_OINTMENT | CUTANEOUS | Status: AC
  Administered 2011-12-05: 1 via NASAL
  Filled 2011-12-05: qty 22

## 2011-12-05 MED ORDER — PROMETHAZINE HCL 25 MG/ML IJ SOLN
6.2500 mg | INTRAMUSCULAR | Status: DC | PRN
  Administered 2011-12-05: 6.25 mg via INTRAVENOUS

## 2011-12-05 SURGICAL SUPPLY — 42 items
ADH SKN CLS APL DERMABOND .7 (GAUZE/BANDAGES/DRESSINGS) ×1
ADH SKN CLS LQ APL DERMABOND (GAUZE/BANDAGES/DRESSINGS) ×1
CANISTER SUCTION 2500CC (MISCELLANEOUS) ×2 IMPLANT
CLIP TI MEDIUM 6 (CLIP) ×2 IMPLANT
CLIP TI WIDE RED SMALL 6 (CLIP) ×2 IMPLANT
CLOTH BEACON ORANGE TIMEOUT ST (SAFETY) ×2 IMPLANT
COVER PROBE W GEL 5X96 (DRAPES) ×1 IMPLANT
COVER SURGICAL LIGHT HANDLE (MISCELLANEOUS) ×2 IMPLANT
DECANTER SPIKE VIAL GLASS SM (MISCELLANEOUS) ×2 IMPLANT
DERMABOND ADHESIVE PROPEN (GAUZE/BANDAGES/DRESSINGS) ×1
DERMABOND ADVANCED (GAUZE/BANDAGES/DRESSINGS) ×1
DERMABOND ADVANCED .7 DNX12 (GAUZE/BANDAGES/DRESSINGS) ×1 IMPLANT
DERMABOND ADVANCED .7 DNX6 (GAUZE/BANDAGES/DRESSINGS) IMPLANT
DRAIN PENROSE 1/2X12 LTX STRL (WOUND CARE) ×1 IMPLANT
ELECT REM PT RETURN 9FT ADLT (ELECTROSURGICAL) ×2
ELECTRODE REM PT RTRN 9FT ADLT (ELECTROSURGICAL) ×1 IMPLANT
GLOVE BIO SURGEON STRL SZ 6.5 (GLOVE) ×1 IMPLANT
GLOVE BIO SURGEON STRL SZ7 (GLOVE) ×2 IMPLANT
GLOVE BIOGEL PI IND STRL 6.5 (GLOVE) IMPLANT
GLOVE BIOGEL PI IND STRL 7.0 (GLOVE) IMPLANT
GLOVE BIOGEL PI IND STRL 7.5 (GLOVE) ×1 IMPLANT
GLOVE BIOGEL PI INDICATOR 6.5 (GLOVE) ×1
GLOVE BIOGEL PI INDICATOR 7.0 (GLOVE) ×1
GLOVE BIOGEL PI INDICATOR 7.5 (GLOVE) ×1
GLOVE SURG SS PI 7.0 STRL IVOR (GLOVE) ×1 IMPLANT
GOWN STRL NON-REIN LRG LVL3 (GOWN DISPOSABLE) ×4 IMPLANT
GOWN STRL REIN XL XLG (GOWN DISPOSABLE) ×1 IMPLANT
KIT BASIN OR (CUSTOM PROCEDURE TRAY) ×2 IMPLANT
KIT ROOM TURNOVER OR (KITS) ×2 IMPLANT
NS IRRIG 1000ML POUR BTL (IV SOLUTION) ×2 IMPLANT
PACK CV ACCESS (CUSTOM PROCEDURE TRAY) ×2 IMPLANT
PAD ARMBOARD 7.5X6 YLW CONV (MISCELLANEOUS) ×4 IMPLANT
SPONGE SURGIFOAM ABS GEL 100 (HEMOSTASIS) IMPLANT
SUT MNCRL AB 4-0 PS2 18 (SUTURE) ×2 IMPLANT
SUT PROLENE 6 0 BV (SUTURE) ×1 IMPLANT
SUT PROLENE 7 0 BV 1 (SUTURE) ×2 IMPLANT
SUT VIC AB 3-0 SH 27 (SUTURE) ×2
SUT VIC AB 3-0 SH 27X BRD (SUTURE) ×1 IMPLANT
TOWEL OR 17X24 6PK STRL BLUE (TOWEL DISPOSABLE) ×2 IMPLANT
TOWEL OR 17X26 10 PK STRL BLUE (TOWEL DISPOSABLE) ×2 IMPLANT
UNDERPAD 30X30 INCONTINENT (UNDERPADS AND DIAPERS) ×2 IMPLANT
WATER STERILE IRR 1000ML POUR (IV SOLUTION) ×2 IMPLANT

## 2011-12-05 NOTE — Telephone Encounter (Signed)
Message copied by Gena Fray on Mon Dec 05, 2011  3:55 PM ------      Message from: Denman George      Created: Mon Dec 05, 2011 12:48 PM                   ----- Message -----         From: Conrad Fairfield, MD         Sent: 12/05/2011   9:03 AM           To: Patrici Ranks, Alfonso Patten, RN            Hideo Tobiason      [id number]      [birth year]            Procedure: L radiocephalic arteriovenous fistula            Follow-up: 4 weeks

## 2011-12-05 NOTE — Preoperative (Signed)
Beta Blockers   Reason not to administer Beta Blockers:Toprol XL this am

## 2011-12-05 NOTE — Anesthesia Procedure Notes (Signed)
Procedure Name: MAC Date/Time: 12/05/2011 7:35 AM Performed by: Sherilyn Banker Pre-anesthesia Checklist: Patient identified, Emergency Drugs available, Suction available, Patient being monitored and Timeout performed Patient Re-evaluated:Patient Re-evaluated prior to inductionOxygen Delivery Method: Simple face mask

## 2011-12-05 NOTE — Telephone Encounter (Signed)
Spoke with patient to scheduled 4 wk follow up and sent letter, dpm

## 2011-12-05 NOTE — Anesthesia Preprocedure Evaluation (Addendum)
Anesthesia Evaluation  Patient identified by [name], date of birth, ID band Patient awake    Reviewed: Allergy & Precautions, H&P , NPO status , Patient's Chart, lab work & pertinent test results, reviewed documented beta blocker date and time   Airway Mallampati: II TM Distance: >3 FB     Dental  (+) Dental Advisory Given and Teeth Intact   Pulmonary          Cardiovascular hypertension, Pt. on home beta blockers     Neuro/Psych    GI/Hepatic   Endo/Other    Renal/GU ESRFRenal disease     Musculoskeletal   Abdominal   Peds  Hematology   Anesthesia Other Findings   Reproductive/Obstetrics                           Anesthesia Physical Anesthesia Plan  ASA: II  Anesthesia Plan: MAC   Post-op Pain Management:    Induction: Intravenous  Airway Management Planned: Simple Face Mask  Additional Equipment:   Intra-op Plan:   Post-operative Plan:   Informed Consent: I have reviewed the patients History and Physical, chart, labs and discussed the procedure including the risks, benefits and alternatives for the proposed anesthesia with the patient or authorized representative who has indicated his/her understanding and acceptance.   Dental advisory given  Plan Discussed with:   Anesthesia Plan Comments:        Anesthesia Quick Evaluation

## 2011-12-05 NOTE — Progress Notes (Signed)
Pt. With new av fistula , no bruit or thrill at this time

## 2011-12-05 NOTE — Anesthesia Postprocedure Evaluation (Signed)
  Anesthesia Post-op Note  Patient: Michael [name]  Procedure(s) Performed: Procedure(s) (LRB): ARTERIOVENOUS (AV) FISTULA CREATION (Left)  Patient Location: PACU  Anesthesia Type: MAC  Level of Consciousness: awake  Airway and Oxygen Therapy: Patient Spontanous Breathing  Post-op Pain: mild  Post-op Assessment: Post-op Vital signs reviewed  Post-op Vital Signs: stable  Complications: No apparent anesthesia complications

## 2011-12-05 NOTE — Interval H&P Note (Signed)
[facility] [place]  History and Physical Update  The patient was interviewed and re-examined.  The patient's previous History and Physical has been reviewed and is unchanged.  There is no change in the plan of care.  Adele Barthel, MD [facility] [place] Office: [phone] Pager: [phone]  12/05/2011, 7:24 AM

## 2011-12-05 NOTE — H&P (View-Only) (Signed)
[facility] [place]  Referred by:  Louis Meckel, MD [place], [place] [id number]  Reason for referral: New access  History of Present Illness  Michael [name] is a 56 y.o. (1955-08-04) male who presents for evaluation for permanent access.  The patient is right hand dominant.  The patient has not had previous access procedures.  Previous central venous cannulation procedures include: none.  The patient has never had a PPM placed. Pt has been told he has ~15% residual kidney function.  Past Medical History  Diagnosis Date  . Chronic kidney disease     Polycystic kidney disease  . Hypertension   . Arthritis     gout  . Hyperparathyroidism, secondary renal     Past Surgical History  Procedure Date  . Appendectomy   . Cardiac catheterization 04-05-2010    History   Social History  . Marital Status: Single    Spouse Name: N/A    Number of Children: N/A  . Years of Education: N/A   Occupational History  . Not on file.   Social History Main Topics  . Smoking status: Never Smoker   . Smokeless tobacco: Not on file  . Alcohol Use: No  . Drug Use: No  . Sexually Active: Not on file   Other Topics Concern  . Not on file   Social History Narrative  . No narrative on file    Family History  Problem Relation Age of Onset  . Heart disease Mother   . Hyperlipidemia Mother   . Hypertension Mother   . Kidney disease Father   . Kidney disease Brother     Current Outpatient Prescriptions on File Prior to Visit  Medication Sig Dispense Refill  . allopurinol (ZYLOPRIM) 100 MG tablet Take 100 mg by mouth daily.      [name] amLODipine (NORVASC) 5 MG tablet Take 5 mg by mouth daily.      . metoprolol succinate (TOPROL-XL) 100 MG 24 hr tablet Take 100 mg by mouth daily. Take with or immediately following a meal.      . paricalcitol (ZEMPLAR) 1 MCG capsule Take 1 mcg by mouth daily.      . simvastatin (ZOCOR) 40 MG tablet Take 40 mg by mouth every  evening.      [name] spironolactone (ALDACTONE) 25 MG tablet Take 25 mg by mouth daily.      [name] telmisartan (MICARDIS) 80 MG tablet Take 80 mg by mouth daily.      . colchicine 0.6 MG tablet Take 0.6 mg by mouth as needed.        Allergies  Allergen Reactions  . Lipitor (Atorvastatin)     Leg pain  . Uloric (Febuxostat)     Leg pain    REVIEW OF SYSTEMS:  (Positives indicated with an "x", otherwise negative)  CARDIOVASCULAR: [ ]  chest pain    [ ]  chest pressure    [ ]  palpitations    [ ]  orthopnea   [ ]  dyspnea on exert. [ ]  claudication    [ ]  rest pain     [ ]  DVT     [ ]  phlebitis  PULMONARY:    [ ]  productive cough [ ]  asthma  [x]  wheezing  NEUROLOGIC:    [ x weakness    [ ]  paresthesias   [ ]  aphasia    [ ]  amaurosis    [ ]  dizziness  HEMATOLOGIC:    [ ]  bleeding problems  [ ]   clotting disorders  MUSCULOSKEL: [ ]  joint pain     [ ]  joint swelling  GASTROINTEST:  [ ]   blood in stool   [ ]   hematemesis  GENITOURINARY:   [ ]   dysuria    [ ]   hematuria  PSYCHIATRIC:   [ ]  history of major depression  INTEGUMENTARY: [x]  rashes    [ ]  ulcers  CONSTITUTIONAL:  [ ]  fever     [ ]  chills  Physical Examination  Filed Vitals:   11/25/11 1329  BP: 143/94  Pulse: 65  Resp: 18  Height: 5\' 9"  (1.753 m)  Weight: 192 lb 8 oz (87.317 kg)  SpO2: 99%   Body mass index is 28.43 kg/(m^2).  General: A&O x 3, WDWN  Head: [place]/AT  Ear/Nose/Throat: Hearing grossly intact, nares w/o erythema or drainage, oropharynx w/o Erythema/Exudate  Eyes: PERRLA, EOMI  Neck: Supple, no nuchal rigidity, no palpable LAD  Pulmonary: Sym exp, good air movt, CTAB, no rales, rhonchi, & wheezing  Cardiac: RRR, Nl S1, S2, no Murmurs, rubs or gallops  Vascular: Vessel Right Left  Radial Palpable Palpable  Brachial Palpable Palpable  Carotid Palpable, without bruit Palpable, without bruit  Aorta Non-palpable N/A  Femoral Palpable Palpable  Popliteal Non-palpable Non-palpable  PT Palpable Palpable    DP Palpable Palpable   Gastrointestinal: soft, NTND, -G/R, - HSM, - masses, - CVAT B  Musculoskeletal: M/S 5/5 throughout , Extremities without ischemic changes   Neurologic: CN 2-12 intact , Pain and light touch intact in extremities , Motor exam as listed above  Psychiatric: Judgment intact, Mood & affect appropriate for pt's clinical situation  Dermatologic: See M/S exam for extremity exam, no rashes otherwise noted  Lymph : No Cervical, Axillary, or Inguinal lymphadenopathy   Non-Invasive Vascular Imaging  Vein Mapping  (Date: 11/25/11):   R arm: acceptable vein conduits include entire cephalic and upper arm basilic  L arm: acceptable vein conduits include entire cephalic and upper arm basilic  Outside Studies/Documentation 5 pages of outside documents were reviewed including: nephrology clinic chart.  Medical Decision Making  Michael [name] is a 56 y.o. male who presents with chronic kidney disease stage IV   Based on vein mapping and examination, this patient's permanent access options include: left RC vs BC AVF  I had an extensive discussion with this patient in regards to the nature of access surgery, including risk, benefits, and alternatives.    The patient is aware that the risks of access surgery include but are not limited to: bleeding, infection, steal syndrome, nerve damage, ischemic monomelic neuropathy, failure of access to mature, and possible need for additional access procedures in the future.  The patient has agreed to proceed with the above procedure which will be scheduled 5 AUG 13.  Adele Barthel, MD [facility] [place] Office: [phone] Pager: [phone]  11/25/2011, 3:12 PM

## 2011-12-05 NOTE — Op Note (Signed)
OPERATIVE NOTE   PROCEDURE: left radiocephaic arteriovenous fistula placement  PRE-OPERATIVE DIAGNOSIS: chronic kidney disease stage IV   POST-OPERATIVE DIAGNOSIS: same as above   SURGEON: Aylah Yeary LIANG-YU, MD  ANESTHESIA: local and MAC  ESTIMATED BLOOD LOSS: 50 cc  FINDING(S): 1.  Small cephalic vein [id number] mm 2.  Radial artery: 3 mm 3.  Palpable thrill and dopplerable radial signal  SPECIMEN(S):  none  INDICATIONS:   Michael [name] is a 56 y.o. male who presents with chronic kidney disease stage IV .  The patient is scheduled for left radiocephalic arteriovenous fistula placement.  The patient is aware the risks include but are not limited to: bleeding, infection, steal syndrome, nerve damage, ischemic monomelic neuropathy, failure to mature, and need for additional procedures.  The patient is aware of the risks of the procedure and elects to proceed forward.  DESCRIPTION: After full informed written consent was obtained from the patient, the patient was brought back to the operating room and placed supine upon the operating table.  Prior to induction, the patient received IV antibiotics.   After obtaining adequate anesthesia, the patient was then prepped and draped in the standard fashion for a left arm access procedure.  I turned my attention first to identifying the patient's distal cephalic vein and radial artery.  Using SonoSite guidance, the location of these vessels were marked out on the skin.   At this point, I injected local anesthetic to obtain a field block of the wrist.  In total, I injected about 10 mL of a 1:1 mixture of 0.5% Marcaine without epinephrine and 1% lidocaine with epinephrine.  I made a transverse incision at the level of the wrist and dissected through the subcutaneous tissue and fascia to gain exposure of the radial artery.  This was noted to be 3 mm in diameter externally.  This was dissected out proximally and distally and controlled with vessel loops .   I then dissected out the cephalic vein.  This was noted to be 2.5-3.0 mm in diameter externally.  The distal segment of the vein was ligated with a  2-0 silk, and the vein was transected.  The proximal segment was iinterrogated with serial dilators.  The vein accepted up to a 3.5 mm dilator with some difficulty.  I then instilled the heparinized saline into the vein and clamped it.  At this point, I reset my exposure of the radial artery and placed the artery under tension proximally and distally.  I made an arteriotomy with a #11 blade, and then I extended the arteriotomy with a Potts scissor.  I injected heparinized saline proximal and distal to this arteriotomy.  The vein was then sewn to the artery in an end-to-side configuration with a running stitch of 7-0 Prolene.  Prior to completing this anastomosis, I allowed the vein and artery to backbleed.  There was no evidence of clot from any vessels.  I completed the anastomosis in the usual fashion and then released all vessel loops and clamps.  There was a palpable thrill in the venous outflow, and there was a dopplerable radial signal.  At this point, I irrigated out the surgical wound.  There was no further active bleeding.  The subcutaneous tissue was reapproximated with a running stitch of 3-0 Vicryl.  The skin was then reapproximated with a running subcuticular stitch of 4-0 Vicryl.  The skin was then cleaned, dried, and reinforced with Dermabond.  The patient tolerated this procedure well.   COMPLICATIONS: none  CONDITION: stable  Hinda Lenis, MD 12/05/2011 8:57 AM

## 2011-12-05 NOTE — Transfer of Care (Signed)
Immediate Anesthesia Transfer of Care Note  Patient: Michael [name]  Procedure(s) Performed: Procedure(s) (LRB): ARTERIOVENOUS (AV) FISTULA CREATION (Left)  Patient Location: PACU  Anesthesia Type: MAC  Level of Consciousness: awake, oriented and patient cooperative  Airway & Oxygen Therapy: Patient Spontanous Breathing  Post-op Assessment: Report given to PACU RN, Post -op Vital signs reviewed and stable and Patient moving all extremities X 4  Post vital signs: Reviewed and stable  Complications: No apparent anesthesia complications

## 2011-12-06 ENCOUNTER — Encounter (HOSPITAL_COMMUNITY): Payer: Self-pay | Admitting: Vascular Surgery

## 2012-01-05 ENCOUNTER — Encounter: Payer: Self-pay | Admitting: Vascular Surgery

## 2012-01-06 ENCOUNTER — Encounter: Payer: Self-pay | Admitting: Vascular Surgery

## 2012-01-06 ENCOUNTER — Ambulatory Visit (INDEPENDENT_AMBULATORY_CARE_PROVIDER_SITE_OTHER): Payer: BC Managed Care – PPO | Admitting: Vascular Surgery

## 2012-01-06 VITALS — BP 154/98 | HR 86 | Resp 16 | Ht 69.0 in | Wt 195.0 lb

## 2012-01-06 DIAGNOSIS — N186 End stage renal disease: Secondary | ICD-10-CM

## 2012-01-06 NOTE — Progress Notes (Signed)
[facility] [place]  Postoperative Access Visit  History of Present Illness  Michael [name] is a 56 y.o. year old male who presents for postoperative follow-up for: L RC AVF (Date: 12/05/11).  The patient's wounds are healed.  The patient notes no steal symptoms.  The patient is able to complete their activities of daily living.  The patient's current symptoms are: none.  Physical Examination  Filed Vitals:   01/06/12 1104  BP: 154/98  Pulse: 86  Resp: 16   LUE: Incision is healed, skin feels warm, hand grip is 5/5, sensation in digits is intact except for area of wrist adjacent to incision, palpable thrill over anastomosis but not elsewhere, bruit cannot be auscultated   Medical Decision Making  Michael [name] is a 56 y.o. year old male who presents s/p failed L RCAVF.  Pt will be scheduled for L BC AVF this coming Wed, 01/11/12. Risk, benefits, and alternatives to access surgery were discussed.  The patient is aware the risks include but are not limited to: bleeding, infection, steal syndrome, nerve damage, ischemic monomelic neuropathy, failure to mature, need for additional procedures, death and stroke.  The patient agrees to proceed forward with the procedure.  Thank you for allowing [place] to participate in this patient's care.  Adele Barthel, MD [facility] [place] Office: [phone] Pager: [phone]

## 2012-01-09 ENCOUNTER — Other Ambulatory Visit: Payer: Self-pay

## 2012-01-10 ENCOUNTER — Encounter (HOSPITAL_COMMUNITY): Payer: Self-pay | Admitting: *Deleted

## 2012-01-10 MED ORDER — SODIUM CHLORIDE 0.9 % IV SOLN
INTRAVENOUS | Status: DC
  Administered 2012-01-11: 13:00:00 via INTRAVENOUS

## 2012-01-10 MED ORDER — DEXTROSE 5 % IV SOLN
3.0000 g | INTRAVENOUS | Status: DC
  Filled 2012-01-10: qty 3000

## 2012-01-10 MED ORDER — CEFAZOLIN SODIUM-DEXTROSE 2-3 GM-% IV SOLR
2.0000 g | INTRAVENOUS | Status: DC
  Filled 2012-01-10: qty 50

## 2012-01-10 NOTE — Progress Notes (Signed)
Pt said he had a 2D Echo and Stress at [facility] a couple of years ago.  I faxed a request to [facility] requesting these results.

## 2012-01-11 ENCOUNTER — Encounter (HOSPITAL_COMMUNITY): Payer: Self-pay | Admitting: Vascular Surgery

## 2012-01-11 ENCOUNTER — Ambulatory Visit (HOSPITAL_COMMUNITY): Payer: BC Managed Care – PPO | Admitting: Vascular Surgery

## 2012-01-11 ENCOUNTER — Encounter (HOSPITAL_COMMUNITY): Payer: Self-pay | Admitting: *Deleted

## 2012-01-11 ENCOUNTER — Encounter (HOSPITAL_COMMUNITY)
Admission: RE | Disposition: A | Discharge status: Home / Self Care | Payer: Self-pay | Source: Ambulatory Visit | Attending: Vascular Surgery

## 2012-01-11 ENCOUNTER — Ambulatory Visit (HOSPITAL_COMMUNITY)
Admission: RE | Admit: 2012-01-11 | Discharge: 2012-01-11 | Disposition: A | Discharge status: Home / Self Care | Payer: BC Managed Care – PPO | Source: Ambulatory Visit | Attending: Vascular Surgery | Admitting: Vascular Surgery

## 2012-01-11 DIAGNOSIS — N189 Chronic kidney disease, unspecified: Secondary | ICD-10-CM | POA: Insufficient documentation

## 2012-01-11 DIAGNOSIS — Q613 Polycystic kidney, unspecified: Secondary | ICD-10-CM | POA: Insufficient documentation

## 2012-01-11 DIAGNOSIS — I129 Hypertensive chronic kidney disease with stage 1 through stage 4 chronic kidney disease, or unspecified chronic kidney disease: Secondary | ICD-10-CM | POA: Insufficient documentation

## 2012-01-11 DIAGNOSIS — N2581 Secondary hyperparathyroidism of renal origin: Secondary | ICD-10-CM | POA: Insufficient documentation

## 2012-01-11 DIAGNOSIS — N184 Chronic kidney disease, stage 4 (severe): Secondary | ICD-10-CM

## 2012-01-11 HISTORY — PX: AV FISTULA PLACEMENT: SHX1204

## 2012-01-11 LAB — POCT I-STAT 4, (NA,K, GLUC, HGB,HCT)
Glucose, Bld: 82 mg/dL (ref 70–99)
HCT: 42 % (ref 39.0–52.0)
Hemoglobin: 14.3 g/dL (ref 13.0–17.0)
Potassium: 4.6 mEq/L (ref 3.5–5.1)
Sodium: 139 mEq/L (ref 135–145)

## 2012-01-11 LAB — SURGICAL PCR SCREEN
MRSA, PCR: NEGATIVE
Staphylococcus aureus: NEGATIVE

## 2012-01-11 SURGERY — ARTERIOVENOUS (AV) FISTULA CREATION
Anesthesia: Monitor Anesthesia Care | Site: Arm Upper | Laterality: Left | Wound class: Clean

## 2012-01-11 MED ORDER — BUPIVACAINE HCL (PF) 0.5 % IJ SOLN
INTRAMUSCULAR | Status: AC
  Filled 2012-01-11: qty 30

## 2012-01-11 MED ORDER — BUPIVACAINE HCL (PF) 0.5 % IJ SOLN
INTRAMUSCULAR | Status: DC | PRN
  Administered 2012-01-11: 8 mL

## 2012-01-11 MED ORDER — METOPROLOL SUCCINATE ER 100 MG PO TB24
100.0000 mg | ORAL_TABLET | Freq: Once | ORAL | Status: AC
  Administered 2012-01-11: 100 mg via ORAL
  Filled 2012-01-11: qty 1

## 2012-01-11 MED ORDER — IRBESARTAN 300 MG PO TABS
300.0000 mg | ORAL_TABLET | Freq: Every day | ORAL | Status: DC
  Administered 2012-01-11: 300 mg via ORAL
  Filled 2012-01-11: qty 1

## 2012-01-11 MED ORDER — MORPHINE SULFATE (PF) 1 MG/ML IV SOLN
INTRAVENOUS | Status: AC
  Filled 2012-01-11: qty 25

## 2012-01-11 MED ORDER — CEFAZOLIN SODIUM 1-5 GM-% IV SOLN
INTRAVENOUS | Status: DC | PRN
  Administered 2012-01-11: 2 g via INTRAVENOUS

## 2012-01-11 MED ORDER — MIDAZOLAM HCL 5 MG/5ML IJ SOLN
INTRAMUSCULAR | Status: DC | PRN
  Administered 2012-01-11: 1 mg via INTRAVENOUS

## 2012-01-11 MED ORDER — FENTANYL CITRATE 0.05 MG/ML IJ SOLN
INTRAMUSCULAR | Status: DC | PRN
  Administered 2012-01-11: 50 ug via INTRAVENOUS
  Administered 2012-01-11 (×2): 25 ug via INTRAVENOUS

## 2012-01-11 MED ORDER — LIDOCAINE-EPINEPHRINE (PF) 1 %-1:200000 IJ SOLN
INTRAMUSCULAR | Status: AC
  Filled 2012-01-11: qty 10

## 2012-01-11 MED ORDER — OXYCODONE HCL 5 MG PO TABS: 5.0000 mg | ORAL_TABLET | ORAL | Status: AC | PRN

## 2012-01-11 MED ORDER — OXYCODONE HCL 5 MG PO TABS: 5.0000 mg | ORAL_TABLET | Freq: Once | ORAL | Status: DC | PRN

## 2012-01-11 MED ORDER — SPIRONOLACTONE 25 MG PO TABS
25.0000 mg | ORAL_TABLET | Freq: Once | ORAL | Status: AC
  Administered 2012-01-11: 25 mg via ORAL
  Filled 2012-01-11: qty 1

## 2012-01-11 MED ORDER — 0.9 % SODIUM CHLORIDE (POUR BTL) OPTIME
TOPICAL | Status: DC | PRN
  Administered 2012-01-11: 1000 mL

## 2012-01-11 MED ORDER — HYDROMORPHONE HCL PF 1 MG/ML IJ SOLN: 0.2500 mg | INTRAMUSCULAR | Status: DC | PRN

## 2012-01-11 MED ORDER — MUPIROCIN 2 % EX OINT
TOPICAL_OINTMENT | CUTANEOUS | Status: AC
  Administered 2012-01-11: 1 via NASAL
  Filled 2012-01-11: qty 22

## 2012-01-11 MED ORDER — THROMBIN 20000 UNITS EX SOLR
CUTANEOUS | Status: AC
  Filled 2012-01-11: qty 20000

## 2012-01-11 MED ORDER — LIDOCAINE HCL (CARDIAC) 20 MG/ML IV SOLN
INTRAVENOUS | Status: DC | PRN
  Administered 2012-01-11: 30 mg via INTRAVENOUS

## 2012-01-11 MED ORDER — OXYCODONE HCL 5 MG/5ML PO SOLN: 5.0000 mg | Freq: Once | ORAL | Status: DC | PRN

## 2012-01-11 MED ORDER — SODIUM CHLORIDE 0.9 % IV SOLN
INTRAVENOUS | Status: DC | PRN
  Administered 2012-01-11: 13:00:00 via INTRAVENOUS

## 2012-01-11 MED ORDER — AMLODIPINE BESYLATE 5 MG PO TABS
5.0000 mg | ORAL_TABLET | Freq: Every day | ORAL | Status: DC
  Administered 2012-01-11: 5 mg via ORAL
  Filled 2012-01-11: qty 1

## 2012-01-11 MED ORDER — MUPIROCIN 2 % EX OINT
TOPICAL_OINTMENT | Freq: Two times a day (BID) | CUTANEOUS | Status: DC
  Administered 2012-01-11: 1 via NASAL
  Filled 2012-01-11: qty 22

## 2012-01-11 MED ORDER — LIDOCAINE-EPINEPHRINE (PF) 1 %-1:200000 IJ SOLN
INTRAMUSCULAR | Status: DC | PRN
  Administered 2012-01-11: 8 mL

## 2012-01-11 MED ORDER — DROPERIDOL 2.5 MG/ML IJ SOLN: 0.6250 mg | INTRAMUSCULAR | Status: DC | PRN

## 2012-01-11 MED ORDER — PROPOFOL INFUSION 10 MG/ML OPTIME
INTRAVENOUS | Status: DC | PRN
  Administered 2012-01-11: 100 ug/kg/min via INTRAVENOUS

## 2012-01-11 MED ORDER — SODIUM CHLORIDE 0.9 % IR SOLN
Status: DC | PRN
  Administered 2012-01-11: 14:00:00

## 2012-01-11 MED ORDER — LACTATED RINGERS IV SOLN: INTRAVENOUS | Status: DC | PRN

## 2012-01-11 SURGICAL SUPPLY — 40 items
ADH SKN CLS APL DERMABOND .7 (GAUZE/BANDAGES/DRESSINGS) ×1
CANISTER SUCTION 2500CC (MISCELLANEOUS) ×2 IMPLANT
CLIP TI MEDIUM 6 (CLIP) ×2 IMPLANT
CLIP TI WIDE RED SMALL 6 (CLIP) ×2 IMPLANT
CLOTH BEACON ORANGE TIMEOUT ST (SAFETY) ×2 IMPLANT
COVER PROBE W GEL 5X96 (DRAPES) ×1 IMPLANT
COVER SURGICAL LIGHT HANDLE (MISCELLANEOUS) ×2 IMPLANT
DECANTER SPIKE VIAL GLASS SM (MISCELLANEOUS) ×2 IMPLANT
DERMABOND ADVANCED (GAUZE/BANDAGES/DRESSINGS) ×1
DERMABOND ADVANCED .7 DNX12 (GAUZE/BANDAGES/DRESSINGS) ×1 IMPLANT
DRAIN PENROSE 1/2X12 LTX STRL (WOUND CARE) IMPLANT
DRAPE U-SHAPE 47X51 STRL (DRAPES) ×1 IMPLANT
ELECT REM PT RETURN 9FT ADLT (ELECTROSURGICAL) ×2
ELECTRODE REM PT RTRN 9FT ADLT (ELECTROSURGICAL) ×1 IMPLANT
GLOVE BIO SURGEON STRL SZ7 (GLOVE) ×2 IMPLANT
GLOVE BIOGEL PI IND STRL 7.0 (GLOVE) IMPLANT
GLOVE BIOGEL PI IND STRL 7.5 (GLOVE) ×1 IMPLANT
GLOVE BIOGEL PI INDICATOR 7.0 (GLOVE) ×2
GLOVE BIOGEL PI INDICATOR 7.5 (GLOVE) ×3
GLOVE SS BIOGEL STRL SZ 7 (GLOVE) IMPLANT
GLOVE SUPERSENSE BIOGEL SZ 7 (GLOVE) ×1
GLOVE SURG SS PI 7.5 STRL IVOR (GLOVE) ×1 IMPLANT
GOWN PREVENTION PLUS XLARGE (GOWN DISPOSABLE) ×1 IMPLANT
GOWN STRL NON-REIN LRG LVL3 (GOWN DISPOSABLE) ×4 IMPLANT
KIT BASIN OR (CUSTOM PROCEDURE TRAY) ×2 IMPLANT
KIT ROOM TURNOVER OR (KITS) ×2 IMPLANT
NS IRRIG 1000ML POUR BTL (IV SOLUTION) ×2 IMPLANT
PACK CAROTID (CUSTOM PROCEDURE TRAY) ×1 IMPLANT
PACK CV ACCESS (CUSTOM PROCEDURE TRAY) ×1 IMPLANT
PAD ARMBOARD 7.5X6 YLW CONV (MISCELLANEOUS) ×4 IMPLANT
SPONGE SURGIFOAM ABS GEL 100 (HEMOSTASIS) IMPLANT
SUT MNCRL AB 4-0 PS2 18 (SUTURE) ×2 IMPLANT
SUT PROLENE 6 0 BV (SUTURE) ×2 IMPLANT
SUT PROLENE 7 0 BV 1 (SUTURE) ×2 IMPLANT
SUT VIC AB 3-0 SH 27 (SUTURE) ×2
SUT VIC AB 3-0 SH 27X BRD (SUTURE) ×1 IMPLANT
TOWEL OR 17X24 6PK STRL BLUE (TOWEL DISPOSABLE) ×2 IMPLANT
TOWEL OR 17X26 10 PK STRL BLUE (TOWEL DISPOSABLE) ×2 IMPLANT
UNDERPAD 30X30 INCONTINENT (UNDERPADS AND DIAPERS) ×2 IMPLANT
WATER STERILE IRR 1000ML POUR (IV SOLUTION) ×2 IMPLANT

## 2012-01-11 NOTE — Anesthesia Postprocedure Evaluation (Signed)
Anesthesia Post Note  Patient: Michael [name]  Procedure(s) Performed: Procedure(s) (LRB): ARTERIOVENOUS (AV) FISTULA CREATION (Left)  Anesthesia type: MAC  Patient location: PACU  Post pain: Pain level controlled  Post assessment: Patient's Cardiovascular Status Stable  Last Vitals:  Filed Vitals:   01/11/12 1650  BP: 149/99  Pulse: 73  Temp: 36.6 C  Resp: 20    Post vital signs: Reviewed and stable  Level of consciousness: sedated  Complications: No apparent anesthesia complications

## 2012-01-11 NOTE — Transfer of Care (Signed)
Immediate Anesthesia Transfer of Care Note  Patient: Michael [name]  Procedure(s) Performed: Procedure(s) (LRB) with comments: ARTERIOVENOUS (AV) FISTULA CREATION (Left) - Creation of left brachial cephalic arteriovenous fistula  Patient Location: PACU  Anesthesia Type: MAC  Level of Consciousness: awake, oriented and patient cooperative  Airway & Oxygen Therapy: Patient Spontanous Breathing and Patient connected to face mask oxygen  Post-op Assessment: Report given to PACU RN, Post -op Vital signs reviewed and stable and Patient moving all extremities X 4  Post vital signs: Reviewed and stable  Complications: No apparent anesthesia complications

## 2012-01-11 NOTE — Op Note (Signed)
OPERATIVE NOTE   PROCEDURE: left brachiocephalic arteriovenous fistula placement  PRE-OPERATIVE DIAGNOSIS: chronic kidney disease stage IV   POST-OPERATIVE DIAGNOSIS: same as above   SURGEON: Adele Barthel, MD  ASSISTANT(S): Gerri Lins, PAC  ANESTHESIA: local and MAC  ESTIMATED BLOOD LOSS: 30 cc  FINDING(S): 1.  Palpable thrill and radial pulse at end of case  SPECIMEN(S):  none  INDICATIONS:   Michael [name] is a 56 y.o. male who presents with chronic kidney disease stage IV.  Patient underwent previously an unsuccessful left radiocephalic arteriovenous fistula.  His prior vein mapping suggested a good cephalic vein on this side, so I recommended a left brachiocephalic arteriovenous fistula.  The patient is scheduled for left brachiocephalic arteriovenous fistula placement.  The patient is aware the risks include but are not limited to: bleeding, infection, steal syndrome, nerve damage, ischemic monomelic neuropathy, failure to mature, and need for additional procedures.  The patient is aware of the risks of the procedure and elects to proceed forward.  DESCRIPTION: After full informed written consent was obtained from the patient, the patient was brought back to the operating room and placed supine upon the operating table.  Prior to induction, the patient received IV antibiotics.   After obtaining adequate anesthesia, the patient was then prepped and draped in the standard fashion for a left arm access procedure.  I turned my attention first to identifying the patient's cephalic vein and brachial artery.  Using SonoSite guidance, the location of these vessels were marked out on the skin.   At this point, I injected local anesthetic to obtain a field block of the antecubitum.  In total, I injected about 10 mL of a 1:1 mixture of 0.5% Marcaine without epinephrine and 1% lidocaine with epinephrine.  I made a transverse incision at the level of the antecubitum and dissected through the  subcutaneous tissue and fascia to gain exposure of the brachial artery.  This was noted to be 3 mm in diameter externally.  This was dissected out proximally and distally and controlled with vessel loops .  I then dissected out the cephalic vein.  This was noted to be 2.5 mm in diameter externally.  However, I suspected the vein was spasming so I elected to proceed.  The distal segment of the vein was ligated with a  2-0 silk, and the vein was transected.  The proximal segment was iinterrogated with serial dilators.  The vein accepted up to a 4 mm dilator without any difficulty.  I then instilled the heparinized saline into the vein and clamped it.  At this point, I reset my exposure of the brachial artery and placed the artery under tension proximally and distally.  I made an arteriotomy with a #11 blade, and then I extended the arteriotomy with a Potts scissor.  I injected heparinized saline proximal and distal to this arteriotomy.  The vein was then sewn to the artery in an end-to-side configuration with a running stitch of 7-0 Prolene.  Prior to completing this anastomosis, I allowed the vein and artery to backbleed.  There was no evidence of clot from any vessels.  I completed the anastomosis in the usual fashion and then released all vessel loops and clamps.  There was a palpable thrill in the venous outflow, and there was a palpable radial pulse.  At this point, I irrigated out the surgical wound.  There was no further active bleeding.  The subcutaneous tissue was reapproximated with a running stitch of 3-0 Vicryl.  The  skin was then reapproximated with a running subcuticular stitch of 4-0 Vicryl.  The skin was then cleaned, dried, and reinforced with Dermabond.  The patient tolerated this procedure well.   COMPLICATIONS: none  CONDITION: stable  Adele Barthel, MD [facility] [place] Office: [phone] Pager: [phone]  01/11/2012, 3:06 PM

## 2012-01-11 NOTE — Preoperative (Signed)
Beta Blockers   Reason not to administer Beta Blockers:Not Applicable, took 8pm last night

## 2012-01-11 NOTE — Progress Notes (Signed)
Dr Tobias Alexander aware pt has BP 166/101.  Order received to give pt BP medication listed on his med rec.

## 2012-01-11 NOTE — Anesthesia Preprocedure Evaluation (Signed)
Anesthesia Evaluation  Patient identified by [name], date of birth, ID band Patient awake    Reviewed: Allergy & Precautions, H&P , NPO status , Patient's Chart, lab work & pertinent test results  History of Anesthesia Complications Negative for: history of anesthetic complications  Airway Mallampati: II TM Distance: >3 FB Neck ROM: Full    Dental  (+) Teeth Intact and Dental Advisory Given   Pulmonary neg pulmonary ROS,  breath sounds clear to auscultation  Pulmonary exam normal       Cardiovascular hypertension, Pt. on medications and Pt. on home beta blockers Rhythm:Regular Rate:Normal     Neuro/Psych negative neurological ROS     GI/Hepatic negative GI ROS, Neg liver ROS,   Endo/Other  negative endocrine ROS  Renal/GU CRFRenal disease     Musculoskeletal   Abdominal   Peds  Hematology   Anesthesia Other Findings   Reproductive/Obstetrics                           Anesthesia Physical Anesthesia Plan  ASA: III  Anesthesia Plan: General and MAC   Post-op Pain Management:    Induction: Intravenous  Airway Management Planned: LMA  Additional Equipment:   Intra-op Plan:   Post-operative Plan: Extubation in OR  Informed Consent: I have reviewed the patients History and Physical, chart, labs and discussed the procedure including the risks, benefits and alternatives for the proposed anesthesia with the patient or authorized representative who has indicated his/her understanding and acceptance.   Dental advisory given  Plan Discussed with: CRNA, Anesthesiologist and Surgeon  Anesthesia Plan Comments:         Anesthesia Quick Evaluation

## 2012-01-11 NOTE — H&P (Signed)
[facility] [place]  Brief History and Physical  History of Present Illness  Michael [name] is a 56 y.o. male who presents with chief complaint: thrombosed L RC AVF.  The patient presents today for L BC AVF.    Past Medical History  Diagnosis Date  . Hypertension   . Arthritis     gout  . Hyperparathyroidism, secondary renal   . Chronic kidney disease     Polycystic kidney disease, not on diaylsis yet  . Kidney stones     pased one, One Lazer     Past Surgical History  Procedure Date  . Appendectomy   . Cardiac catheterization 04-05-2010    checking for blockage but none-[facility]  . Av fistula placement 12/05/2011    Procedure: ARTERIOVENOUS (AV) FISTULA CREATION;  Surgeon: Conrad , MD;  Location: [place];  Service: Vascular;  Laterality: Left;  RADIO-CEPHALIC  fistula left arm    History   Social History  . Marital Status: Married    Spouse Name: N/A    Number of Children: N/A  . Years of Education: N/A   Occupational History  . Not on file.   Social History Main Topics  . Smoking status: Never Smoker   . Smokeless tobacco: Never Used  . Alcohol Use: No  . Drug Use: No  . Sexually Active: Not on file   Other Topics Concern  . Not on file   Social History Narrative  . No narrative on file    Family History  Problem Relation Age of Onset  . Heart disease Mother   . Hyperlipidemia Mother   . Hypertension Mother   . Kidney disease Father   . Kidney disease Brother     No current facility-administered medications on file prior to encounter.   Current Outpatient Prescriptions on File Prior to Encounter  Medication Sig Dispense Refill  . allopurinol (ZYLOPRIM) 100 MG tablet Take 100 mg by mouth daily.      [name] amLODipine (NORVASC) 5 MG tablet Take 5 mg by mouth daily.      . colchicine 0.6 MG tablet Take 0.6 mg by mouth as needed. Gout flare      . metoprolol succinate (TOPROL-XL) 100 MG 24 hr tablet Take 100 mg by mouth daily. Take  with or immediately following a meal.      . paricalcitol (ZEMPLAR) 1 MCG capsule Take 1 mcg by mouth daily.      . simvastatin (ZOCOR) 40 MG tablet Take 40 mg by mouth every evening.      [name] spironolactone (ALDACTONE) 25 MG tablet Take 25 mg by mouth daily.      [name] telmisartan (MICARDIS) 80 MG tablet Take 80 mg by mouth daily.        Allergies  Allergen Reactions  . Lipitor (Atorvastatin)     Leg pain  . Uloric (Febuxostat)     Leg pain    Review of Systems: As listed above, otherwise negative.  Physical Examination  Filed Vitals:   01/11/12 0843  BP: 153/104  Pulse: 89  Temp: 98 F (36.7 C)  TempSrc: Oral  Resp: 20  SpO2: 99%    General: A&O x 3, WDWN  Pulmonary: Sym exp, good air movt, CTAB, no rales, rhonchi, & wheezing  Cardiac: RRR, Nl S1, S2, no Murmurs, rubs or gallops  Gastrointestinal: soft, NTND, -G/R, - HSM, - masses, - CVAT B  Musculoskeletal: M/S 5/5 throughout , Extremities without ischemic changes , L wrist  incision healed, no thrill  Laboratory See iStat  Medical Decision Making  Michael [name] is a 56 y.o. male who presents with: L BC AVF.   The patient is scheduled for: L BC AVF  Risk, benefits, and alternatives to access surgery were discussed.  The patient is aware the risks include but are not limited to: bleeding, infection, steal syndrome, nerve damage, ischemic monomelic neuropathy, failure to mature, and need for additional procedures.  The patient is aware of the risks and agrees to proceed.  Adele Barthel, MD [facility] [place] Office: [phone] Pager: [phone]  01/11/2012, 8:47 AM

## 2012-01-12 ENCOUNTER — Encounter (HOSPITAL_COMMUNITY): Payer: Self-pay | Admitting: Vascular Surgery

## 2012-01-12 ENCOUNTER — Telehealth: Payer: Self-pay | Admitting: Vascular Surgery

## 2012-01-12 NOTE — Telephone Encounter (Addendum)
Message copied by Lujean Amel on Thu Jan 12, 2012  2:22 PM ------      Message from: Alfonso Patten      Created: Wed Jan 11, 2012  5:24 PM                   ----- Message -----         From: Conrad [place], MD         Sent: 01/11/2012   3:11 PM           To: Patrici Ranks, Alfonso Patten, RN            [facility]      [id number]      [birth year]            PROCEDURE:      left brachiocephalic arteriovenous fistula placement            Asst: Gerri Lins, [facility]             Follow-up: 4 wk            I scheduled an appt for the above pt on 02/10/12 at 1:30pm w/ blc. Pt is aware of the appt and I also mailed an appt letter. awt

## 2012-02-09 ENCOUNTER — Encounter: Payer: Self-pay | Admitting: Vascular Surgery

## 2012-02-10 ENCOUNTER — Ambulatory Visit (INDEPENDENT_AMBULATORY_CARE_PROVIDER_SITE_OTHER): Payer: BC Managed Care – PPO | Admitting: Vascular Surgery

## 2012-02-10 ENCOUNTER — Encounter: Payer: Self-pay | Admitting: Vascular Surgery

## 2012-02-10 VITALS — BP 142/90 | HR 103 | Resp 18 | Ht 69.0 in | Wt 198.0 lb

## 2012-02-10 DIAGNOSIS — N186 End stage renal disease: Secondary | ICD-10-CM

## 2012-02-10 DIAGNOSIS — Z4931 Encounter for adequacy testing for hemodialysis: Secondary | ICD-10-CM

## 2012-02-10 NOTE — Progress Notes (Signed)
[facility] [place]  Postoperative Access Visit  History of Present Illness  Michael [name] is a 56 y.o. year old male who presents for postoperative follow-up for: L BC AVF (Date: 01/11/12).  The patient's wounds are healed.  The patient notes no steal symptoms.  The patient is able to complete their activities of daily living.  The patient's current symptoms are: mild incision numbess.  Physical Examination  Filed Vitals:   02/10/12 1410  BP: 142/90  Pulse: 103  Resp: 18   LUE: Incision is healed, skin feels warm, hand grip is 5/5, sensation in digits is intact, palpable thrill, bruit can be auscultated   Medical Decision Making  Michael [name] is a 56 y.o. year old male who presents s/p L BC AVF.  Patient will follow up in 4 weeks for formal access duplex to check maturation and depth and side branch presence.  I suspected given the excellent exam, this fistula may be ready by next follow up for use.  Thank you for allowing [place] to participate in this patient's care.  Adele Barthel, MD [facility] [place] Office: [phone] Pager: [phone]

## 2012-02-13 NOTE — Addendum Note (Signed)
Addended by: Mena Goes on: 02/13/2012 11:15 AM   Modules accepted: Orders

## 2012-02-27 DIAGNOSIS — Z9049 Acquired absence of other specified parts of digestive tract: Secondary | ICD-10-CM | POA: Insufficient documentation

## 2012-02-27 DIAGNOSIS — N186 End stage renal disease: Secondary | ICD-10-CM | POA: Insufficient documentation

## 2012-02-27 DIAGNOSIS — Q612 Polycystic kidney, adult type: Secondary | ICD-10-CM | POA: Insufficient documentation

## 2012-02-27 DIAGNOSIS — Z01818 Encounter for other preprocedural examination: Secondary | ICD-10-CM | POA: Insufficient documentation

## 2012-03-01 ENCOUNTER — Encounter (INDEPENDENT_AMBULATORY_CARE_PROVIDER_SITE_OTHER): Payer: Self-pay | Admitting: General Surgery

## 2012-03-05 ENCOUNTER — Ambulatory Visit (INDEPENDENT_AMBULATORY_CARE_PROVIDER_SITE_OTHER): Payer: BC Managed Care – PPO | Admitting: General Surgery

## 2012-03-05 ENCOUNTER — Encounter (INDEPENDENT_AMBULATORY_CARE_PROVIDER_SITE_OTHER): Payer: Self-pay

## 2012-03-05 ENCOUNTER — Encounter (INDEPENDENT_AMBULATORY_CARE_PROVIDER_SITE_OTHER): Payer: Self-pay | Admitting: General Surgery

## 2012-03-05 VITALS — BP 144/94 | HR 72 | Temp 98.6°F | Resp 16 | Ht 67.0 in | Wt 198.4 lb

## 2012-03-05 DIAGNOSIS — K429 Umbilical hernia without obstruction or gangrene: Secondary | ICD-10-CM

## 2012-03-05 NOTE — Progress Notes (Signed)
Patient ID: Michael [name], male   DOB: 03/23/1956, 55 y.o.   [name]: [id number]  Chief Complaint  Patient presents with  . Umbilical Hernia    new pt    HPI Michael [name] is a 56 y.o. male.  Referred by Dr. Moshe Cipro HPI This is a 56 year old male who has end-stage renal disease from polycystic kidney disease. He has been seen at the [facility] transplant service as well as followed by Dr. Moshe Cipro here in [place]. He is about to be on the transplant list apparently. Last time he was seen at [facility] he was diagnosed with an umbilical hernia and then the note I have from them it states that he has a considerable umbilical hernia which would need to be fixed before transplant. He really does not have a lot of symptoms relative to this hernia. He notices that it does bulge out more over the last number of months. It occasionally causes him some discomfort when he is working and doing lifting. He has no trouble with nausea or vomiting or any difficulty with his bowels. It always reduces. Past Medical History  Diagnosis Date  . Hypertension   . Arthritis     gout  . Hyperparathyroidism, secondary renal   . Chronic kidney disease     Polycystic kidney disease, not on diaylsis yet  . Kidney stones     pased one, One Lazer   . ESRD (end stage renal disease)   . H/O cardiac catheterization   . Hyperlipidemia     Past Surgical History  Procedure Date  . Appendectomy   . Cardiac catheterization 04-05-2010    checking for blockage but none-[facility]  . Av fistula placement 12/05/2011    Procedure: ARTERIOVENOUS (AV) FISTULA CREATION;  Surgeon: Conrad Dupo, MD;  Location: [place];  Service: Vascular;  Laterality: Left;  RADIO-CEPHALIC  fistula left arm  . Av fistula placement 01/11/2012    Procedure: ARTERIOVENOUS (AV) FISTULA CREATION;  Surgeon: Conrad [place], MD;  Location: [place];  Service: Vascular;  Laterality: Left;  Creation of left brachial cephalic arteriovenous fistula    Family History    Problem Relation Age of Onset  . Heart disease Mother   . Hyperlipidemia Mother   . Hypertension Mother   . Kidney disease Father   . Stroke Father   . Kidney disease Brother     Social History History  Substance Use Topics  . Smoking status: Never Smoker   . Smokeless tobacco: Never Used  . Alcohol Use: No    Allergies  Allergen Reactions  . Allopurinol   . Lipitor (Atorvastatin)     Leg pain  . Uloric (Febuxostat)     Leg pain    Current Outpatient Prescriptions  Medication Sig Dispense Refill  . allopurinol (ZYLOPRIM) 100 MG tablet Take 200 mg by mouth daily.      [name] amLODipine (NORVASC) 5 MG tablet Take 5 mg by mouth daily.      . colchicine 0.6 MG tablet Take 0.6 mg by mouth as needed. Gout flare      . furosemide (LASIX) 40 MG tablet Take 40 mg by mouth daily.      . metoprolol succinate (TOPROL-XL) 100 MG 24 hr tablet Take 100 mg by mouth daily. Take with or immediately following a meal.      . paricalcitol (ZEMPLAR) 1 MCG capsule Take 1 mcg by mouth daily.      . promethazine (PHENERGAN) 25 MG tablet Take 25 mg by mouth  every 6 (six) hours as needed.      . simvastatin (ZOCOR) 40 MG tablet Take 40 mg by mouth every evening.      [name] spironolactone (ALDACTONE) 25 MG tablet Take 25 mg by mouth daily.        Review of Systems Review of Systems  Constitutional: Negative for fever, chills and unexpected weight change.  HENT: Negative for hearing loss, congestion, sore throat, trouble swallowing and voice change.   Eyes: Negative for visual disturbance.  Respiratory: Negative for cough and wheezing.   Cardiovascular: Positive for leg swelling. Negative for chest pain and palpitations.  Gastrointestinal: Negative for nausea, vomiting, abdominal pain, diarrhea, constipation, blood in stool, abdominal distention, anal bleeding and rectal pain.  Genitourinary: Negative for hematuria and difficulty urinating.  Musculoskeletal: Negative for arthralgias.  Skin: Negative for  rash and wound.  Neurological: Negative for seizures, syncope, weakness and headaches.  Hematological: Negative for adenopathy. Does not bruise/bleed easily.  Psychiatric/Behavioral: Negative for confusion.    Blood pressure 144/94, pulse 72, temperature 98.6 F (37 C), temperature source Temporal, resp. rate 16, height 5\' 7"  (1.702 m), weight 198 lb 6.4 oz (89.994 kg).  Physical Exam Physical Exam  Vitals reviewed. Constitutional: He appears well-developed and well-nourished.  Cardiovascular: Normal rate, regular rhythm and normal heart sounds.   Pulmonary/Chest: Effort normal and breath sounds normal. He has no wheezes. He has no rales.  Abdominal: Soft. Bowel sounds are normal. He exhibits no distension. There is tenderness in the periumbilical area. A hernia (tender 1.5 cm umbilical hernia) is present.  Lymphadenopathy:    He has no cervical adenopathy.    Data Reviewed Notes from [facility] transplant service and Dr. Moshe Cipro  Assessment    [facility]    Plan    It appears from the transplant note that this does need to be repaired before he can get listed. I discussed with him today and umbilical hernia repair which would likely be with mesh given the size of it it is my examination today. I'm going to plan on doing an open umbilical hernia repair with mesh. I discussed the risks including, but not limited to, bleeding, infection, recurrence, progressive renal failure that might require immediate dialysis quicker. He understands this and we will proceed as I will also check with his nephrologist prior to doing this as well.       [name] 03/05/2012, 9:22 AM

## 2012-03-08 ENCOUNTER — Encounter (HOSPITAL_COMMUNITY): Payer: Self-pay | Admitting: Pharmacy Technician

## 2012-03-13 ENCOUNTER — Encounter (HOSPITAL_COMMUNITY)
Admission: RE | Admit: 2012-03-13 | Discharge: 2012-03-13 | Disposition: A | Discharge status: Home / Self Care | Payer: BC Managed Care – PPO | Source: Ambulatory Visit | Attending: General Surgery | Admitting: General Surgery

## 2012-03-13 ENCOUNTER — Encounter (HOSPITAL_COMMUNITY): Payer: Self-pay

## 2012-03-13 HISTORY — DX: Other fatigue: R53.83

## 2012-03-13 LAB — SURGICAL PCR SCREEN
MRSA, PCR: NEGATIVE
Staphylococcus aureus: NEGATIVE

## 2012-03-13 NOTE — Progress Notes (Signed)
Primary Physician - Dr. Estill Bamberg [facility] Kidney Physician - Dr. Moshe Cipro - clearance note on chart Stress, cath, ekg, echo all performed at [facility] within last 2 years. Will request records

## 2012-03-13 NOTE — Progress Notes (Signed)
03/13/12 1014  OBSTRUCTIVE SLEEP APNEA  Score 4 or greater  Results sent to PCP

## 2012-03-13 NOTE — Pre-Procedure Instructions (Signed)
[place]  03/13/2012   Your procedure is scheduled on:  Tuesday, November 19th  Report to [facility] at 0730 AM.  Call this number if you have problems the morning of surgery: [phone]   Remember:   Do not eat food or drink:After Midnight.   Take these medicines the morning of surgery with A SIP OF WATER: toprol, allopurinol, norvasc   Do not wear jewelry, make-up or nail polish.  Do not wear lotions, powders, or perfumes.   Do not shave 48 hours prior to surgery. Men may shave face and neck.  Do not bring valuables to the hospital.  Contacts, dentures or bridgework may not be worn into surgery.  Leave suitcase in the car. After surgery it may be brought to your room.  For patients admitted to the hospital, checkout time is 11:00 AM the day of discharge.   Patients discharged the day of surgery will not be allowed to drive home.   Special Instructions: Shower using CHG 2 nights before surgery and the night before surgery.  If you shower the day of surgery use CHG.  Use special wash - you have one bottle of CHG for all showers.  You should use approximately 1/3 of the bottle for each shower.   Please read over the following fact sheets that you were given: Pain Booklet, Coughing and Deep Breathing, MRSA Information and Surgical Site Infection Prevention

## 2012-03-15 ENCOUNTER — Encounter: Payer: Self-pay | Admitting: Vascular Surgery

## 2012-03-16 ENCOUNTER — Encounter (INDEPENDENT_AMBULATORY_CARE_PROVIDER_SITE_OTHER): Payer: BC Managed Care – PPO | Admitting: *Deleted

## 2012-03-16 ENCOUNTER — Encounter: Payer: Self-pay | Admitting: Vascular Surgery

## 2012-03-16 ENCOUNTER — Ambulatory Visit (INDEPENDENT_AMBULATORY_CARE_PROVIDER_SITE_OTHER): Payer: BC Managed Care – PPO | Admitting: Vascular Surgery

## 2012-03-16 VITALS — BP 145/87 | HR 77

## 2012-03-16 DIAGNOSIS — T82598A Other mechanical complication of other cardiac and vascular devices and implants, initial encounter: Secondary | ICD-10-CM

## 2012-03-16 DIAGNOSIS — Z4931 Encounter for adequacy testing for hemodialysis: Secondary | ICD-10-CM

## 2012-03-16 DIAGNOSIS — N186 End stage renal disease: Secondary | ICD-10-CM

## 2012-03-16 NOTE — Patient Instructions (Signed)
Pt given letter to return to full duty at work on  Monday 03/19/12.

## 2012-03-16 NOTE — Progress Notes (Signed)
[facility]   Postoperative Visit hemodialysis access   Date of Surgery: 01/11/12 Surgeon: Eudelia Bunch, Md Nephrologist: Dr. Moshe Cipro  HPI: Michael [name] is a 56 y.o. male who is 9 weeks S/P creation of left upper extremity Hemodialysis access. The patient denies symptoms of numbness, tingling, weakness and denies pain in the operative limb. Patient is here for post -op evaluation to assess healing and maturation of left B-C .  Pt is not on hemodialysis as yet but may need to begin HD in [place].   Physical Examination  Filed Vitals:   03/16/12 1130  BP: 145/87  Pulse: 35    WDWN male in NAD.  left upper extremity Incision is healed Skin color is normal   Hand grip is 5/5 and sensation in digits is intact; There is a good thrill and good bruit in the Left B-C AVF. The graft/fistula is easily palpable and of adequate size  Assessment/Plan Michael [name] is a 56 y.o. year old who is s/p creation/revision of left upper extremity Hemodialysis access.  The patient's access will be ready for use in 2 weeks.  Duplex of AVF shows good size>66mm and depth of approx 53mm with good flow   Clinic MD: [place]   Addendum  I have independently interviewed and examined the patient, and I agree with the physician assistant's findings.   Pt's L BC AVF is matured.  I doubt the anastomotic stenosis noted on the duplex, like due to angle of insonation.  Adele Barthel, MD [facility] [place] Office: [phone] Pager: [phone]  03/16/2012, 12:28 PM

## 2012-03-20 ENCOUNTER — Telehealth (INDEPENDENT_AMBULATORY_CARE_PROVIDER_SITE_OTHER): Payer: Self-pay | Admitting: General Surgery

## 2012-03-20 ENCOUNTER — Other Ambulatory Visit (INDEPENDENT_AMBULATORY_CARE_PROVIDER_SITE_OTHER): Payer: Self-pay | Admitting: General Surgery

## 2012-03-20 ENCOUNTER — Encounter (HOSPITAL_COMMUNITY): Payer: Self-pay | Admitting: Anesthesiology

## 2012-03-20 ENCOUNTER — Encounter (HOSPITAL_COMMUNITY): Payer: Self-pay | Admitting: *Deleted

## 2012-03-20 ENCOUNTER — Ambulatory Visit (HOSPITAL_COMMUNITY): Payer: BC Managed Care – PPO | Admitting: Anesthesiology

## 2012-03-20 ENCOUNTER — Ambulatory Visit (HOSPITAL_COMMUNITY)
Admission: RE | Admit: 2012-03-20 | Discharge: 2012-03-20 | Disposition: A | Discharge status: Home / Self Care | Payer: BC Managed Care – PPO | Source: Ambulatory Visit | Attending: General Surgery | Admitting: General Surgery

## 2012-03-20 ENCOUNTER — Encounter (HOSPITAL_COMMUNITY)
Admission: RE | Disposition: A | Discharge status: Home / Self Care | Payer: Self-pay | Source: Ambulatory Visit | Attending: General Surgery

## 2012-03-20 DIAGNOSIS — I120 Hypertensive chronic kidney disease with stage 5 chronic kidney disease or end stage renal disease: Secondary | ICD-10-CM | POA: Insufficient documentation

## 2012-03-20 DIAGNOSIS — N186 End stage renal disease: Secondary | ICD-10-CM | POA: Insufficient documentation

## 2012-03-20 DIAGNOSIS — N2581 Secondary hyperparathyroidism of renal origin: Secondary | ICD-10-CM | POA: Insufficient documentation

## 2012-03-20 DIAGNOSIS — M109 Gout, unspecified: Secondary | ICD-10-CM | POA: Insufficient documentation

## 2012-03-20 DIAGNOSIS — Q613 Polycystic kidney, unspecified: Secondary | ICD-10-CM | POA: Insufficient documentation

## 2012-03-20 DIAGNOSIS — K429 Umbilical hernia without obstruction or gangrene: Secondary | ICD-10-CM | POA: Insufficient documentation

## 2012-03-20 DIAGNOSIS — Z79899 Other long term (current) drug therapy: Secondary | ICD-10-CM | POA: Insufficient documentation

## 2012-03-20 DIAGNOSIS — E785 Hyperlipidemia, unspecified: Secondary | ICD-10-CM | POA: Insufficient documentation

## 2012-03-20 HISTORY — PX: UMBILICAL HERNIA REPAIR: SHX196

## 2012-03-20 HISTORY — PX: INSERTION OF MESH: SHX5868

## 2012-03-20 LAB — POCT I-STAT 4, (NA,K, GLUC, HGB,HCT)
Glucose, Bld: 90 mg/dL (ref 70–99)
HCT: 42 % (ref 39.0–52.0)
Hemoglobin: 14.3 g/dL (ref 13.0–17.0)
Potassium: 4.1 mEq/L (ref 3.5–5.1)
Sodium: 142 mEq/L (ref 135–145)

## 2012-03-20 SURGERY — REPAIR, HERNIA, UMBILICAL, ADULT
Anesthesia: General | Site: Abdomen | Wound class: Clean

## 2012-03-20 MED ORDER — CEFAZOLIN SODIUM-DEXTROSE 2-3 GM-% IV SOLR: 2.0000 g | Freq: Once | INTRAVENOUS | Status: DC

## 2012-03-20 MED ORDER — SODIUM CHLORIDE 0.9 % IV SOLN
INTRAVENOUS | Status: DC | PRN
  Administered 2012-03-20: 09:00:00 via INTRAVENOUS

## 2012-03-20 MED ORDER — SUCCINYLCHOLINE CHLORIDE 20 MG/ML IJ SOLN
INTRAMUSCULAR | Status: DC | PRN
  Administered 2012-03-20: 100 mg via INTRAVENOUS

## 2012-03-20 MED ORDER — HYDROCODONE-ACETAMINOPHEN 5-325 MG PO TABS: 1.0000 | ORAL_TABLET | Freq: Four times a day (QID) | ORAL | Status: DC | PRN

## 2012-03-20 MED ORDER — LIDOCAINE HCL (CARDIAC) 20 MG/ML IV SOLN
INTRAVENOUS | Status: DC | PRN
  Administered 2012-03-20: 100 mg via INTRAVENOUS

## 2012-03-20 MED ORDER — ARTIFICIAL TEARS OP OINT
TOPICAL_OINTMENT | OPHTHALMIC | Status: DC | PRN
  Administered 2012-03-20: 1 via OPHTHALMIC

## 2012-03-20 MED ORDER — SODIUM CHLORIDE 0.9 % IV SOLN
INTRAVENOUS | Status: DC
  Administered 2012-03-20: 09:00:00 via INTRAVENOUS

## 2012-03-20 MED ORDER — GLYCOPYRROLATE 0.2 MG/ML IJ SOLN
INTRAMUSCULAR | Status: DC | PRN
  Administered 2012-03-20: 0.4 mg via INTRAVENOUS

## 2012-03-20 MED ORDER — ONDANSETRON HCL 4 MG/2ML IJ SOLN
INTRAMUSCULAR | Status: DC | PRN
  Administered 2012-03-20: 4 mg via INTRAVENOUS

## 2012-03-20 MED ORDER — CEFAZOLIN SODIUM-DEXTROSE 2-3 GM-% IV SOLR
INTRAVENOUS | Status: AC
  Administered 2012-03-20: 2 g via INTRAVENOUS
  Filled 2012-03-20: qty 50

## 2012-03-20 MED ORDER — PROPOFOL 10 MG/ML IV BOLUS
INTRAVENOUS | Status: DC | PRN
  Administered 2012-03-20: 180 mg via INTRAVENOUS
  Administered 2012-03-20: 20 mg via INTRAVENOUS

## 2012-03-20 MED ORDER — FENTANYL CITRATE 0.05 MG/ML IJ SOLN
INTRAMUSCULAR | Status: DC | PRN
  Administered 2012-03-20 (×4): 50 ug via INTRAVENOUS

## 2012-03-20 MED ORDER — NEOSTIGMINE METHYLSULFATE 1 MG/ML IJ SOLN
INTRAMUSCULAR | Status: DC | PRN
  Administered 2012-03-20: 3 mg via INTRAVENOUS

## 2012-03-20 MED ORDER — BUPIVACAINE-EPINEPHRINE 0.25% -1:200000 IJ SOLN
INTRAMUSCULAR | Status: DC | PRN
  Administered 2012-03-20: 10 mL

## 2012-03-20 MED ORDER — OXYCODONE-ACETAMINOPHEN 5-325 MG PO TABS: 1.0000 | ORAL_TABLET | ORAL | Status: DC | PRN

## 2012-03-20 MED ORDER — MIDAZOLAM HCL 5 MG/5ML IJ SOLN
INTRAMUSCULAR | Status: DC | PRN
  Administered 2012-03-20: 2 mg via INTRAVENOUS

## 2012-03-20 MED ORDER — 0.9 % SODIUM CHLORIDE (POUR BTL) OPTIME
TOPICAL | Status: DC | PRN
  Administered 2012-03-20: 1000 mL

## 2012-03-20 MED ORDER — ROCURONIUM BROMIDE 100 MG/10ML IV SOLN
INTRAVENOUS | Status: DC | PRN
  Administered 2012-03-20: 15 mg via INTRAVENOUS

## 2012-03-20 MED ORDER — BUPIVACAINE HCL (PF) 0.25 % IJ SOLN
INTRAMUSCULAR | Status: AC
  Filled 2012-03-20: qty 30

## 2012-03-20 SURGICAL SUPPLY — 46 items
APL SKNCLS STERI-STRIP NONHPOA (GAUZE/BANDAGES/DRESSINGS) ×1
BENZOIN TINCTURE PRP APPL 2/3 (GAUZE/BANDAGES/DRESSINGS) ×1 IMPLANT
BLADE SURG 10 STRL SS (BLADE) ×2 IMPLANT
BLADE SURG 15 STRL LF DISP TIS (BLADE) ×1 IMPLANT
BLADE SURG 15 STRL SS (BLADE) ×2
BLADE SURG ROTATE 9660 (MISCELLANEOUS) ×1 IMPLANT
CANISTER SUCTION 2500CC (MISCELLANEOUS) ×1 IMPLANT
CHLORAPREP W/TINT 26ML (MISCELLANEOUS) ×2 IMPLANT
CLOTH BEACON ORANGE TIMEOUT ST (SAFETY) ×2 IMPLANT
CLSR STERI-STRIP ANTIMIC 1/2X4 (GAUZE/BANDAGES/DRESSINGS) ×2 IMPLANT
COVER SURGICAL LIGHT HANDLE (MISCELLANEOUS) ×2 IMPLANT
DECANTER SPIKE VIAL GLASS SM (MISCELLANEOUS) ×2 IMPLANT
DRAPE PED LAPAROTOMY (DRAPES) ×2 IMPLANT
DRSG TEGADERM 4X4.75 (GAUZE/BANDAGES/DRESSINGS) ×1 IMPLANT
ELECT CAUTERY BLADE 6.4 (BLADE) ×2 IMPLANT
ELECT REM PT RETURN 9FT ADLT (ELECTROSURGICAL) ×2
ELECTRODE REM PT RTRN 9FT ADLT (ELECTROSURGICAL) ×1 IMPLANT
GAUZE SPONGE 2X2 8PLY STRL LF (GAUZE/BANDAGES/DRESSINGS) IMPLANT
GAUZE SPONGE 4X4 16PLY XRAY LF (GAUZE/BANDAGES/DRESSINGS) ×2 IMPLANT
GLOVE BIO SURGEON STRL SZ7 (GLOVE) ×2 IMPLANT
GLOVE BIOGEL PI IND STRL 7.5 (GLOVE) ×1 IMPLANT
GLOVE BIOGEL PI INDICATOR 7.5 (GLOVE) ×1
GOWN STRL NON-REIN LRG LVL3 (GOWN DISPOSABLE) ×4 IMPLANT
KIT BASIN OR (CUSTOM PROCEDURE TRAY) ×2 IMPLANT
KIT ROOM TURNOVER OR (KITS) ×2 IMPLANT
NDL HYPO 25GX1X1/2 BEV (NEEDLE) ×1 IMPLANT
NEEDLE HYPO 25GX1X1/2 BEV (NEEDLE) ×2 IMPLANT
NS IRRIG 1000ML POUR BTL (IV SOLUTION) ×2 IMPLANT
PACK SURGICAL SETUP 50X90 (CUSTOM PROCEDURE TRAY) ×2 IMPLANT
PAD ARMBOARD 7.5X6 YLW CONV (MISCELLANEOUS) ×4 IMPLANT
PATCH VENTRAL MEDIUM 6.4 (Mesh Specialty) ×2 IMPLANT
PENCIL BUTTON HOLSTER BLD 10FT (ELECTRODE) ×2 IMPLANT
SPONGE GAUZE 2X2 STER 10/PKG (GAUZE/BANDAGES/DRESSINGS) ×1
STRIP CLOSURE SKIN 1/2X4 (GAUZE/BANDAGES/DRESSINGS) ×1 IMPLANT
SUT MNCRL AB 4-0 PS2 18 (SUTURE) ×2 IMPLANT
SUT PROLENE 2 0 CT2 30 (SUTURE) ×2 IMPLANT
SUT VIC AB 2-0 CT1 27 (SUTURE) ×2
SUT VIC AB 2-0 CT1 TAPERPNT 27 (SUTURE) ×1 IMPLANT
SUT VIC AB 3-0 SH 27 (SUTURE) ×4
SUT VIC AB 3-0 SH 27X BRD (SUTURE) ×1 IMPLANT
SUT VICRYL AB 2 0 TIES (SUTURE) ×2 IMPLANT
SYR BULB 3OZ (MISCELLANEOUS) ×1 IMPLANT
SYR CONTROL 10ML LL (SYRINGE) ×2 IMPLANT
TOWEL OR 17X26 10 PK STRL BLUE (TOWEL DISPOSABLE) ×2 IMPLANT
TUBE CONNECTING 12X1/4 (SUCTIONS) IMPLANT
WATER STERILE IRR 1000ML POUR (IV SOLUTION) IMPLANT

## 2012-03-20 NOTE — Telephone Encounter (Signed)
Pt home from surgery ([facility]) and there is a spot of bright, red blood on the dressing.  Reassured family this is result of moving around to get home.  Instructed to apply ice pack to the site for vasoconstriction and watch for now.  If there is a continuous oozing of bright red blood, return to the ER for management.  A small amount of drainage is not unexpected, nor is a "diluted bloody-looking" drainage.  They understand.

## 2012-03-20 NOTE — H&P (View-Only) (Signed)
Patient ID: Michael [name], male   DOB: 06-Nov-1955, 56 y.o.   [name]: [id number]  Chief Complaint  Patient presents with  . Umbilical Hernia    new pt    HPI Michael [name] is a 56 y.o. male.  Referred by Dr. Moshe [name] HPI This is a 56 year old male who has end-stage renal disease from polycystic kidney disease. He has been seen at the [facility] transplant service as well as followed by Dr. Moshe [name] here in [place]. He is about to be on the transplant list apparently. Last time he was seen at [facility] he was diagnosed with an umbilical hernia and then the note I have from them it states that he has a considerable umbilical hernia which would need to be fixed before transplant. He really does not have a lot of symptoms relative to this hernia. He notices that it does bulge out more over the last number of months. It occasionally causes him some discomfort when he is working and doing lifting. He has no trouble with nausea or vomiting or any difficulty with his bowels. It always reduces. Past Medical History  Diagnosis Date  . Hypertension   . Arthritis     gout  . Hyperparathyroidism, secondary renal   . Chronic kidney disease     Polycystic kidney disease, not on diaylsis yet  . Kidney stones     pased one, [place]   . ESRD (end stage renal disease)   . H/O cardiac catheterization   . Hyperlipidemia     Past Surgical History  Procedure Date  . Appendectomy   . Cardiac catheterization 04-05-2010    checking for blockage but none-[facility]  . Av fistula placement 12/05/2011    Procedure: ARTERIOVENOUS (AV) FISTULA CREATION;  Surgeon: Michael Clemmons, MD;  Location: [place];  Service: Vascular;  Laterality: Left;  RADIO-CEPHALIC  fistula left arm  . Av fistula placement 01/11/2012    Procedure: ARTERIOVENOUS (AV) FISTULA CREATION;  Surgeon: Michael Ferndale, MD;  Location: [place];  Service: Vascular;  Laterality: Left;  Creation of left brachial cephalic arteriovenous fistula    Family History    Problem Relation Age of Onset  . Heart disease Mother   . Hyperlipidemia Mother   . Hypertension Mother   . Kidney disease Father   . Stroke Father   . Kidney disease Brother     Social History History  Substance Use Topics  . Smoking status: Never Smoker   . Smokeless tobacco: Never Used  . Alcohol Use: No    Allergies  Allergen Reactions  . Allopurinol   . Lipitor (Atorvastatin)     Leg pain  . Uloric (Febuxostat)     Leg pain    Current Outpatient Prescriptions  Medication Sig Dispense Refill  . allopurinol (ZYLOPRIM) 100 MG tablet Take 200 mg by mouth daily.      [name] amLODipine (NORVASC) 5 MG tablet Take 5 mg by mouth daily.      . colchicine 0.6 MG tablet Take 0.6 mg by mouth as needed. Gout flare      . furosemide (LASIX) 40 MG tablet Take 40 mg by mouth daily.      . metoprolol succinate (TOPROL-XL) 100 MG 24 hr tablet Take 100 mg by mouth daily. Take with or immediately following a meal.      . paricalcitol (ZEMPLAR) 1 MCG capsule Take 1 mcg by mouth daily.      . promethazine (PHENERGAN) 25 MG tablet Take 25 mg by mouth  every 6 (six) hours as needed.      . simvastatin (ZOCOR) 40 MG tablet Take 40 mg by mouth every evening.      [name] spironolactone (ALDACTONE) 25 MG tablet Take 25 mg by mouth daily.        Review of Systems Review of Systems  Constitutional: Negative for fever, chills and unexpected weight change.  HENT: Negative for hearing loss, congestion, sore throat, trouble swallowing and voice change.   Eyes: Negative for visual disturbance.  Respiratory: Negative for cough and wheezing.   Cardiovascular: Positive for leg swelling. Negative for chest pain and palpitations.  Gastrointestinal: Negative for nausea, vomiting, abdominal pain, diarrhea, constipation, blood in stool, abdominal distention, anal bleeding and rectal pain.  Genitourinary: Negative for hematuria and difficulty urinating.  Musculoskeletal: Negative for arthralgias.  Skin: Negative for  rash and wound.  Neurological: Negative for seizures, syncope, weakness and headaches.  Hematological: Negative for adenopathy. Does not bruise/bleed easily.  Psychiatric/Behavioral: Negative for confusion.    Blood pressure 144/94, pulse 72, temperature 98.6 F (37 C), temperature source Temporal, resp. rate 16, height 5\' 7"  (1.702 m), weight 198 lb 6.4 oz (89.994 kg).  Physical Exam Physical Exam  Vitals reviewed. Constitutional: He appears well-developed and well-nourished.  Cardiovascular: Normal rate, regular rhythm and normal heart sounds.   Pulmonary/Chest: Effort normal and breath sounds normal. He has no wheezes. He has no rales.  Abdominal: Soft. Bowel sounds are normal. He exhibits no distension. There is tenderness in the periumbilical area. A hernia (tender 1.5 cm umbilical hernia) is present.  Lymphadenopathy:    He has no cervical adenopathy.    Data Reviewed Notes from [facility] transplant service and Dr. Moshe [name]  Assessment    [facility]    Plan    It appears from the transplant note that this does need to be repaired before he can get listed. I discussed with him today and umbilical hernia repair which would likely be with mesh given the size of it it is my examination today. I'm going to plan on doing an open umbilical hernia repair with mesh. I discussed the risks including, but not limited to, bleeding, infection, recurrence, progressive renal failure that might require immediate dialysis quicker. He understands this and we will proceed as I will also check with his nephrologist prior to doing this as well.       Michael [name] 03/05/2012, 9:22 AM

## 2012-03-20 NOTE — Interval H&P Note (Signed)
History and Physical Interval Note:  03/20/2012 8:38 AM  Michael [name]  has presented today for surgery, with the diagnosis of umbilical hernia  The various methods of treatment have been discussed with the patient and family. After consideration of risks, benefits and other options for treatment, the patient has consented to  Procedure(s) (LRB) with comments: HERNIA REPAIR UMBILICAL ADULT (N/A) - umbilical hernia repair with mesh INSERTION OF MESH (N/A) as a surgical intervention .  The patient's history has been reviewed, patient examined, no change in status, stable for surgery.  I have reviewed the patient's chart and labs.  Questions were answered to the patient's satisfaction.     [name]

## 2012-03-20 NOTE — Transfer of Care (Signed)
Immediate Anesthesia Transfer of Care Note  Patient: Michael [name]  Procedure(s) Performed: Procedure(s) (LRB) with comments: HERNIA REPAIR UMBILICAL ADULT (N/A) INSERTION OF MESH (N/A)  Patient Location: PACU  Anesthesia Type:General  Level of Consciousness: awake, alert  and oriented  Airway & Oxygen Therapy: Patient Spontanous Breathing and Patient connected to face mask oxygen  Post-op Assessment: Report given to PACU RN  Post vital signs: Reviewed and stable  Complications: No apparent anesthesia complications

## 2012-03-20 NOTE — Anesthesia Preprocedure Evaluation (Signed)
Anesthesia Evaluation  Patient identified by [name], date of birth, ID band Patient awake    Reviewed: Allergy & Precautions, H&P , NPO status   History of Anesthesia Complications Negative for: history of anesthetic complications  Airway Mallampati: II      Dental  (+) Teeth Intact   Pulmonary  breath sounds clear to auscultation        Cardiovascular hypertension, Rhythm:Regular Rate:Normal     Neuro/Psych    GI/Hepatic negative GI ROS, Neg liver ROS,   Endo/Other  negative endocrine ROS  Renal/GU Renal InsufficiencyRenal disease     Musculoskeletal   Abdominal   Peds  Hematology   Anesthesia Other Findings   Reproductive/Obstetrics                           Anesthesia Physical Anesthesia Plan  ASA: III  Anesthesia Plan: General   Post-op Pain Management:    Induction: Intravenous  Airway Management Planned: LMA  Additional Equipment:   Intra-op Plan:   Post-operative Plan:   Informed Consent: I have reviewed the patients History and Physical, chart, labs and discussed the procedure including the risks, benefits and alternatives for the proposed anesthesia with the patient or authorized representative who has indicated his/her understanding and acceptance.   Dental advisory given  Plan Discussed with: Surgeon  Anesthesia Plan Comments:         Anesthesia Quick Evaluation

## 2012-03-20 NOTE — Preoperative (Signed)
Beta Blockers   Reason not to administer Beta Blockers:Not Applicable 

## 2012-03-20 NOTE — Anesthesia Procedure Notes (Signed)
Procedure Name: Intubation Date/Time: 03/20/2012 9:32 AM Performed by: Olita Takeshita, Virgel Gess Pre-anesthesia Checklist: Emergency Drugs available, Patient identified, Suction available and Patient being monitored Patient Re-evaluated:Patient Re-evaluated prior to inductionOxygen Delivery Method: Circle system utilized Preoxygenation: Pre-oxygenation with 100% oxygen Intubation Type: IV induction Grade View: Grade II Tube type: Oral Tube size: 7.5 mm Number of attempts: 1 Placement Confirmation: ETT inserted through vocal cords under direct vision,  breath sounds checked- equal and bilateral and positive ETCO2 Secured at: 23 cm Tube secured with: Tape Dental Injury: Teeth and Oropharynx as per pre-operative assessment

## 2012-03-20 NOTE — Op Note (Signed)
Preoperative diagnosis: Umbilical hernia Postoperative diagnosis: Same as above Procedure: Umbilical hernia repair with 6.4 cm proceed ventral patch Surgeon: Dr. Serita Grammes Anesthesia: Gen. Endotracheal Estimated blood loss: Minimal Specimens: None Drains: None Complications: None Sponge and needle count correct at end of operation Disposition to recovery stable  Indications This is a 56 year old male with near end-stage renal disease who is being considered for a transplant. He was evaluated at [facility] for a transplant and was recommended to have this umbilical hernia repair fixed prior to his transplant. This is not very symptomatic but due to this he and I discussed repair. We discussed an open repair possibly with mesh.  Procedure: After informed consent was obtained the patient was taken to the operative period was administered 2 g of intravenous cefazolin. Sequential compression devices were on his legs. He was then placed under general anesthesia with an LMA which was later transition to an endotracheal tube. His abdomen was prepped and draped in the standard sterile surgical fashion. A surgical timeout was then performed.  I infiltrated Marcaine below his umbilicus. I then made a curvilinear incision. I carried this out to his umbilical stalk which I divided. It was at this point realized that we needed an endotracheal tube and this was transitioned. Eventually I was able to clear away some scar tissue around his umbilicus. He really had no pre-peritoneal space and I was immediately in the peritoneum after dividing the stalk. He had some adhesions inferiorly that I took down through the hernia defect. Eventually I was able to place a 6.4 cm Proceed ventral patch. I had some difficulty getting this to lie flat but eventually I was able to get this to lie flat against the abdominal wall and was happy with the position. I then sutured the ends to the fascia with 2-0 Prolene suture. This held  the mesh lied flat position. I then cut the ends. Hemostasis was observed. The hernia defect was completely obliterated and the mesh appeared to be in good position. I then tacked the umbilicus down with 3-0 undyed Vicryl. I then closed the incision with 3-0 Vicryl and 4 Monocryl. Steri-Strips and sterile dressing were placed. He tolerated this well was extubated and transferred to recovery stable.

## 2012-03-20 NOTE — Anesthesia Postprocedure Evaluation (Signed)
  Anesthesia Post-op Note  Patient: Michael [name]  Procedure(s) Performed: Procedure(s) (LRB) with comments: HERNIA REPAIR UMBILICAL ADULT (N/A) INSERTION OF MESH (N/A)  Patient Location: PACU  Anesthesia Type:General  Level of Consciousness: awake  Airway and Oxygen Therapy: Patient Spontanous Breathing  Post-op Pain: mild  Post-op Assessment: Post-op Vital signs reviewed  Post-op Vital Signs: stable  Complications: No apparent anesthesia complications

## 2012-03-22 ENCOUNTER — Encounter (HOSPITAL_COMMUNITY): Payer: Self-pay | Admitting: General Surgery

## 2012-04-01 ENCOUNTER — Inpatient Hospital Stay (HOSPITAL_COMMUNITY): Payer: BC Managed Care – PPO

## 2012-04-01 ENCOUNTER — Inpatient Hospital Stay (HOSPITAL_COMMUNITY)
Admission: EM | Admit: 2012-04-01 | Discharge: 2012-04-09 | DRG: 585 | Disposition: A | Discharge status: Home / Self Care | Payer: BC Managed Care – PPO | Attending: General Surgery | Admitting: General Surgery

## 2012-04-01 ENCOUNTER — Encounter (HOSPITAL_COMMUNITY): Payer: Self-pay | Admitting: Nurse Practitioner

## 2012-04-01 ENCOUNTER — Emergency Department (HOSPITAL_COMMUNITY): Payer: BC Managed Care – PPO

## 2012-04-01 DIAGNOSIS — K56609 Unspecified intestinal obstruction, unspecified as to partial versus complete obstruction: Secondary | ICD-10-CM

## 2012-04-01 DIAGNOSIS — I120 Hypertensive chronic kidney disease with stage 5 chronic kidney disease or end stage renal disease: Secondary | ICD-10-CM | POA: Diagnosis present

## 2012-04-01 DIAGNOSIS — E876 Hypokalemia: Secondary | ICD-10-CM | POA: Diagnosis present

## 2012-04-01 DIAGNOSIS — K560 Paralytic ileus: Secondary | ICD-10-CM | POA: Diagnosis present

## 2012-04-01 DIAGNOSIS — N2581 Secondary hyperparathyroidism of renal origin: Secondary | ICD-10-CM | POA: Diagnosis present

## 2012-04-01 DIAGNOSIS — Z79899 Other long term (current) drug therapy: Secondary | ICD-10-CM

## 2012-04-01 DIAGNOSIS — Y838 Other surgical procedures as the cause of abnormal reaction of the patient, or of later complication, without mention of misadventure at the time of the procedure: Secondary | ICD-10-CM | POA: Diagnosis present

## 2012-04-01 DIAGNOSIS — N186 End stage renal disease: Secondary | ICD-10-CM | POA: Diagnosis present

## 2012-04-01 DIAGNOSIS — N179 Acute kidney failure, unspecified: Secondary | ICD-10-CM | POA: Diagnosis present

## 2012-04-01 DIAGNOSIS — M1990 Unspecified osteoarthritis, unspecified site: Secondary | ICD-10-CM | POA: Diagnosis present

## 2012-04-01 DIAGNOSIS — K929 Disease of digestive system, unspecified: Principal | ICD-10-CM | POA: Diagnosis present

## 2012-04-01 DIAGNOSIS — E875 Hyperkalemia: Secondary | ICD-10-CM | POA: Diagnosis present

## 2012-04-01 DIAGNOSIS — Q613 Polycystic kidney, unspecified: Secondary | ICD-10-CM

## 2012-04-01 DIAGNOSIS — E785 Hyperlipidemia, unspecified: Secondary | ICD-10-CM | POA: Diagnosis present

## 2012-04-01 LAB — CBC WITH DIFFERENTIAL/PLATELET
Basophils Absolute: 0 10*3/uL (ref 0.0–0.1)
Basophils Relative: 0 % (ref 0–1)
Eosinophils Absolute: 0.1 10*3/uL (ref 0.0–0.7)
Eosinophils Relative: 1 % (ref 0–5)
HCT: 40.7 % (ref 39.0–52.0)
Hemoglobin: 14.8 g/dL (ref 13.0–17.0)
Lymphocytes Relative: 14 % (ref 12–46)
Lymphs Abs: 1.8 10*3/uL (ref 0.7–4.0)
MCH: 29.1 pg (ref 26.0–34.0)
MCHC: 36.4 g/dL — ABNORMAL HIGH (ref 30.0–36.0)
MCV: 80 fL (ref 78.0–100.0)
Monocytes Absolute: 0.7 10*3/uL (ref 0.1–1.0)
Monocytes Relative: 6 % (ref 3–12)
Neutro Abs: 10.1 10*3/uL — ABNORMAL HIGH (ref 1.7–7.7)
Neutrophils Relative %: 80 % — ABNORMAL HIGH (ref 43–77)
Platelets: 359 10*3/uL (ref 150–400)
RBC: 5.09 MIL/uL (ref 4.22–5.81)
RDW: 13 % (ref 11.5–15.5)
WBC: 12.7 10*3/uL — ABNORMAL HIGH (ref 4.0–10.5)

## 2012-04-01 LAB — URINALYSIS, MICROSCOPIC ONLY
Glucose, UA: NEGATIVE mg/dL
Hgb urine dipstick: NEGATIVE
Ketones, ur: 15 mg/dL — AB
Nitrite: NEGATIVE
Protein, ur: 100 mg/dL — AB
Specific Gravity, Urine: 1.016 (ref 1.005–1.030)
Urobilinogen, UA: 0.2 mg/dL (ref 0.0–1.0)
pH: 5 (ref 5.0–8.0)

## 2012-04-01 LAB — COMPREHENSIVE METABOLIC PANEL
ALT: 18 U/L (ref 0–53)
AST: 19 U/L (ref 0–37)
Albumin: 4.8 g/dL (ref 3.5–5.2)
Alkaline Phosphatase: 63 U/L (ref 39–117)
BUN: 58 mg/dL — ABNORMAL HIGH (ref 6–23)
CO2: 24 mEq/L (ref 19–32)
Calcium: 11.2 mg/dL — ABNORMAL HIGH (ref 8.4–10.5)
Chloride: 96 mEq/L (ref 96–112)
Creatinine, Ser: 5.54 mg/dL — ABNORMAL HIGH (ref 0.50–1.35)
GFR calc Af Amer: 12 mL/min — ABNORMAL LOW (ref >90)
GFR calc non Af Amer: 11 mL/min — ABNORMAL LOW (ref >90)
Glucose, Bld: 119 mg/dL — ABNORMAL HIGH (ref 70–99)
Potassium: 5 mEq/L (ref 3.5–5.1)
Sodium: 137 mEq/L (ref 135–145)
Total Bilirubin: 0.9 mg/dL (ref 0.3–1.2)
Total Protein: 8.8 g/dL — ABNORMAL HIGH (ref 6.0–8.3)

## 2012-04-01 LAB — LIPASE, BLOOD: Lipase: 107 U/L — ABNORMAL HIGH (ref 11–59)

## 2012-04-01 IMAGING — CT CT ABD-PELV W/O CM
2 of 4 series · 14 of 32 positions shown, 19 images · non-contrast
Comparison: None.

CLINICAL DATA: Abdominal pain and cramping.  Small bowel
obstruction.  Nausea.  Renal failure.

CT ABDOMEN AND PELVIS WITHOUT CONTRAST
TECHNIQUE: Multidetector CT imaging of the abdomen and pelvis was
performed following the standard protocol without intravenous
contrast.

[Series 2: routine abdomen · axial · 0.83mm/px · z∈[-395,-35]mm · 8 of 94 slices shown, 13 images]
[im 11/94  soft-tissue]
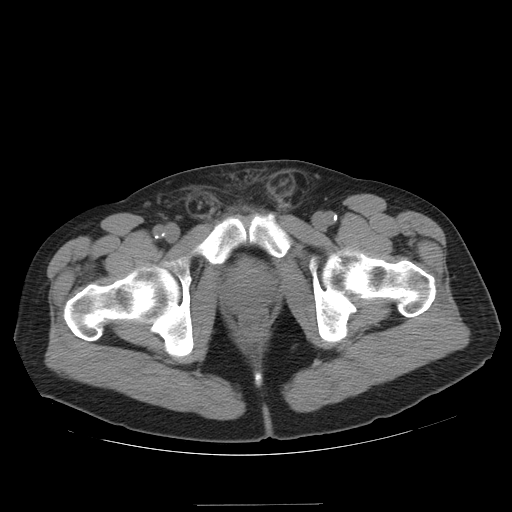
[im 11/94  bone]
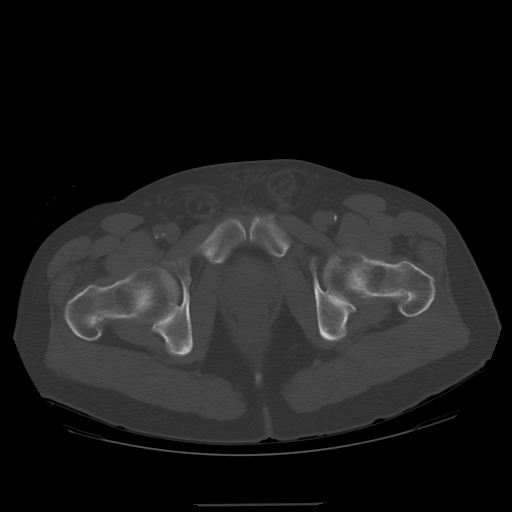
[im 21/94  soft-tissue]
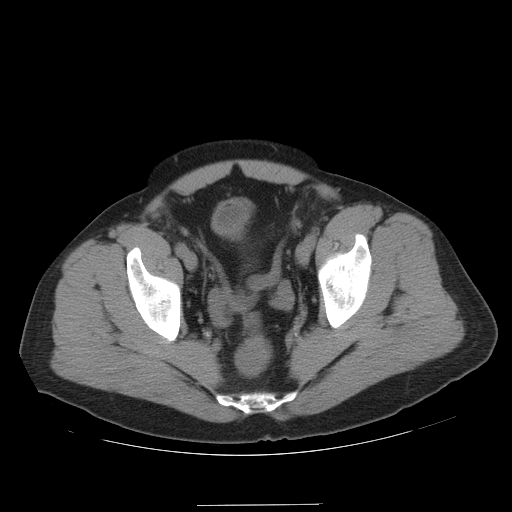
[im 32/94  soft-tissue]
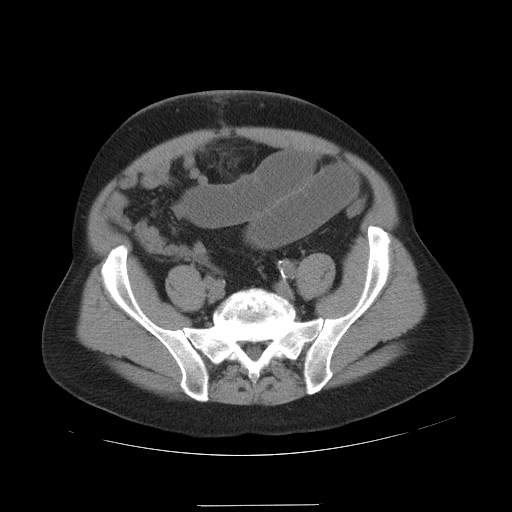
[im 42/94  soft-tissue]
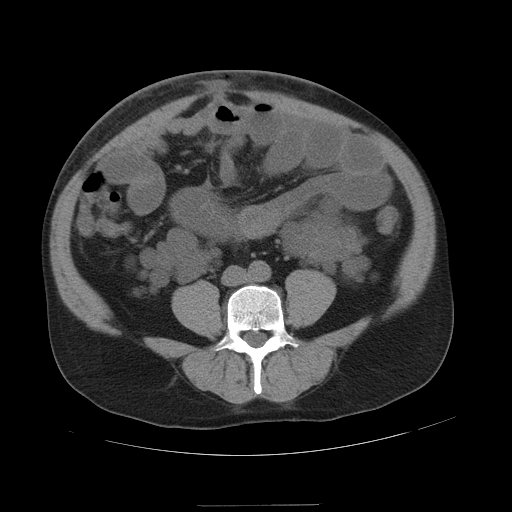
[im 52/94  soft-tissue]
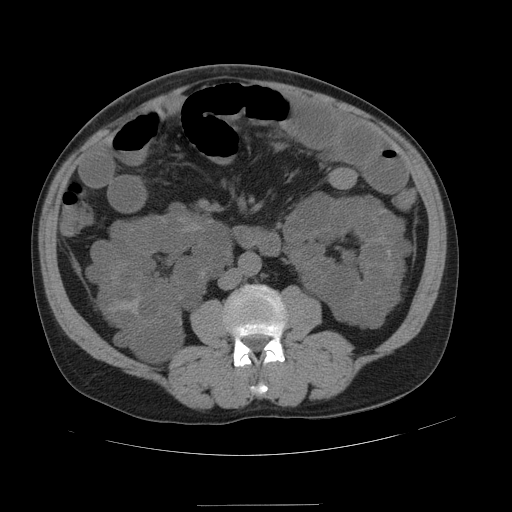
[im 52/94  lung]
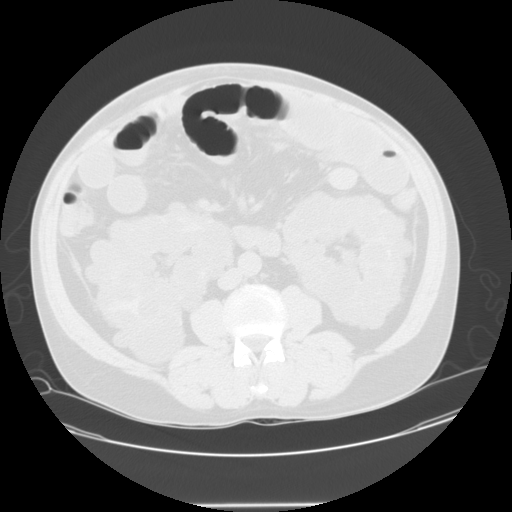
[im 63/94  soft-tissue]
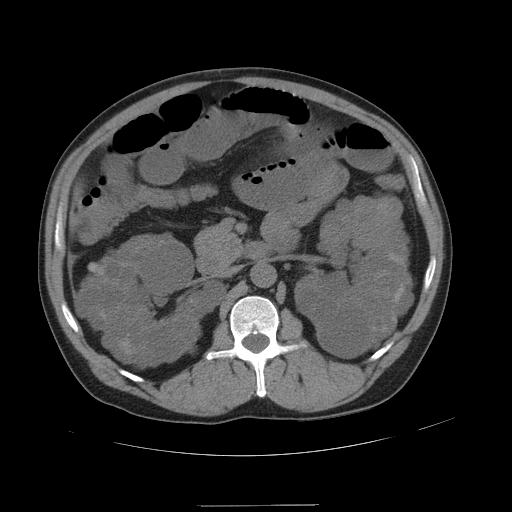
[im 63/94  lung]
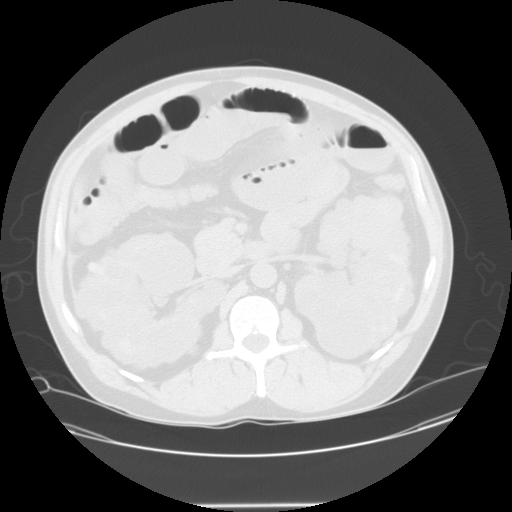
[im 73/94  soft-tissue]
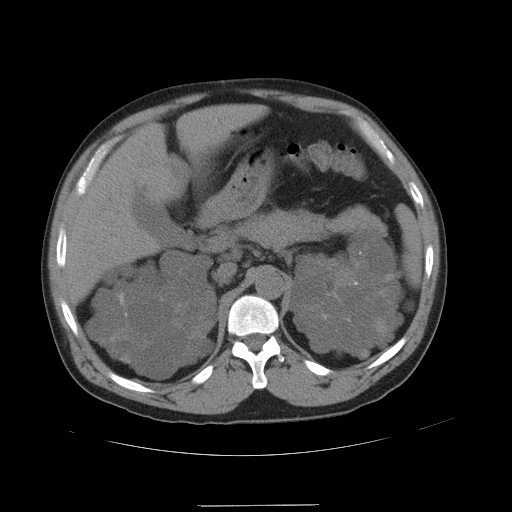
[im 73/94  lung]
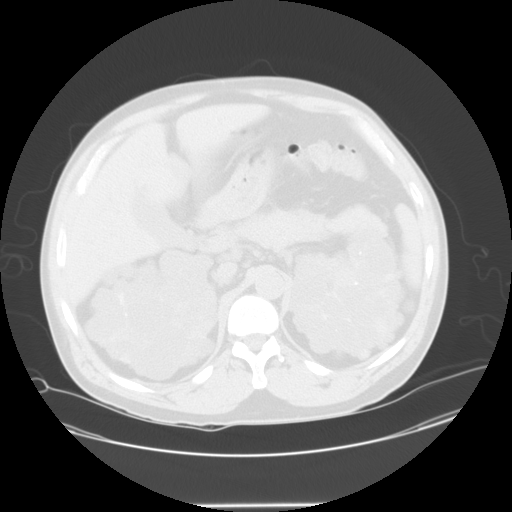
[im 83/94  soft-tissue]
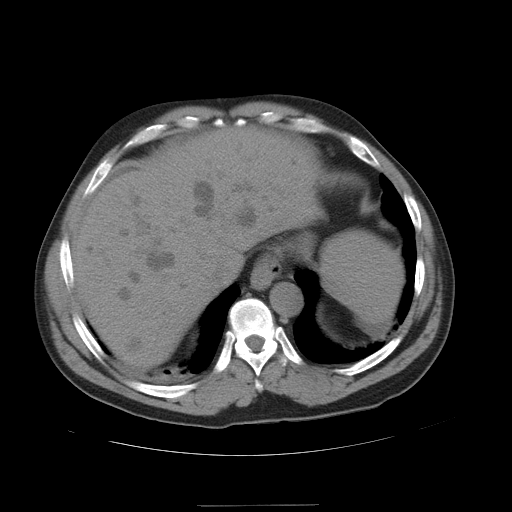
[im 83/94  lung]
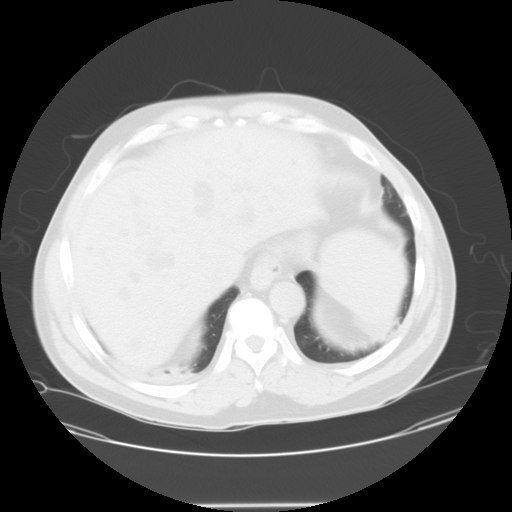

[Series 104: sagittals · sagittal · 0.94mm/px · 6 of 119 slices shown]
[im 12/119  soft-tissue]
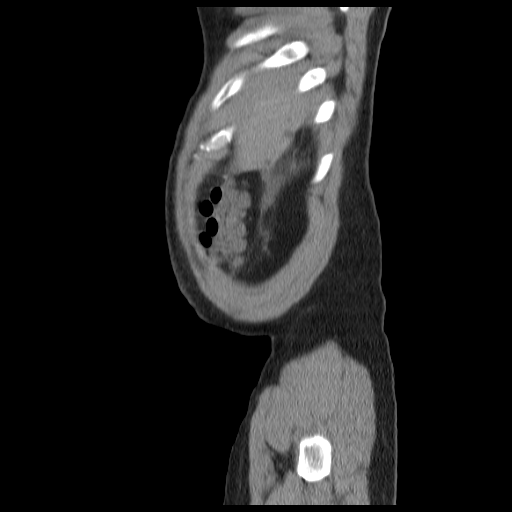
[im 24/119  soft-tissue]
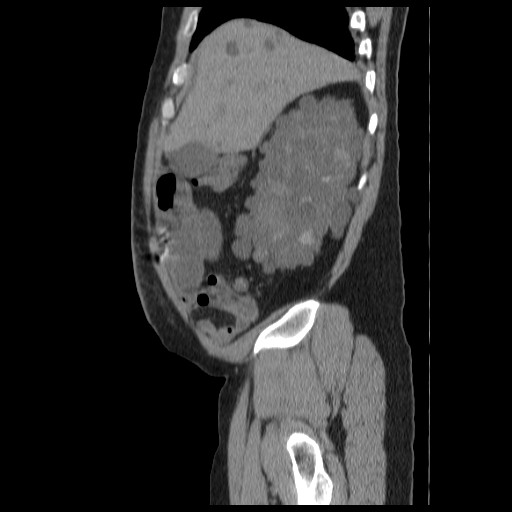
[im 36/119  soft-tissue]
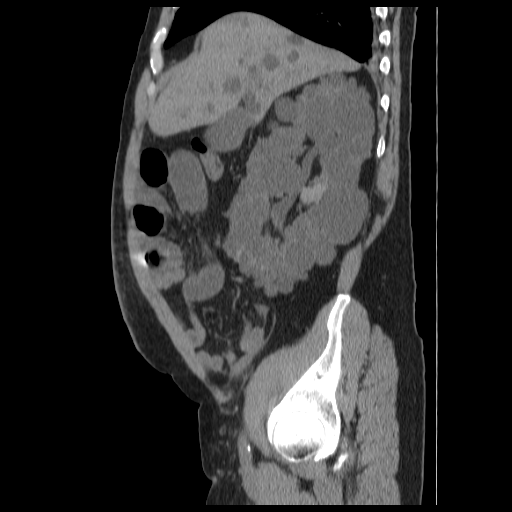
[im 48/119  soft-tissue]
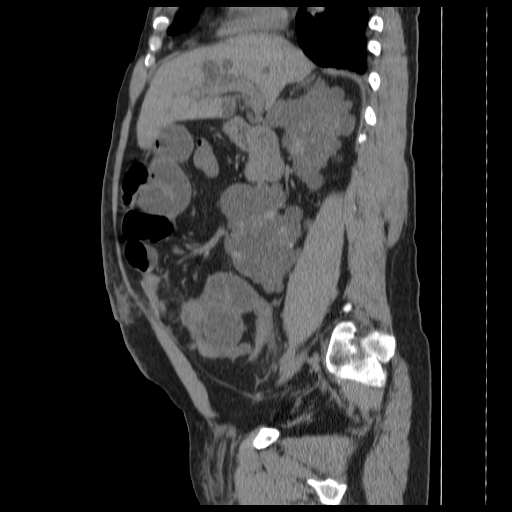
[im 71/119  soft-tissue]
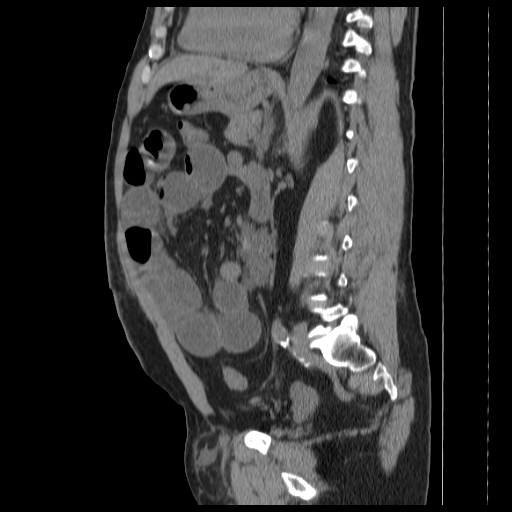
[im 83/119  soft-tissue]
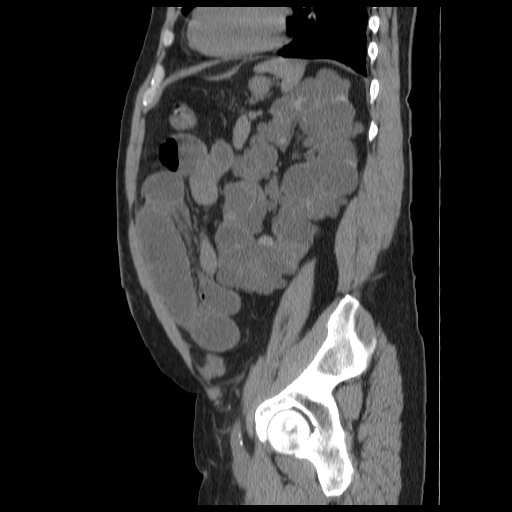

[14 of 32 positions shown; findings below may reference images not displayed]

FINDINGS: Mild to moderately dilated small bowel loops containing
air fluid levels are seen within the abdomen and pelvis.  There is
a transition point to nondilated distal small bowel loops in the
anterior lower abdomen just deep to anterior wall surgical mesh,
and this is suspicious for adhesion. Recent postop change are seen
in the intra-abdominal wall soft tissues however there is no
evidence of recurrent hernia.  No evidence of abscess, free air, or
free fluid.

The kidneys are markedly enlarged and show numerous cysts, as well
as some punctate calcifications and high attenuation lesions likely
representing proteinaceous cysts.  This is consistent with
autosomal dominant polycystic kidney disease.  There is no evidence
of hydronephrosis.  There are also multiple small cysts seen
throughout the hepatic parenchyma.

The gallbladder, pancreas, spleen, and adrenal glands are normal
appearance on this noncontrast study.  No lymphadenopathy
identified.  No focal inflammatory process noted.  Mildly enlarged
prostate gland is seen.
IMPRESSION: 1.  Mid small bowel obstruction, with transition point in the
anterior lower abdomen, just deep to the anterior wall surgical
mesh, highly suspicious for adhesion. No evidence of recurrent
abdominal wall hernia.
2.  Autosomal dominant polycystic kidney disease.

## 2012-04-01 IMAGING — CR DG ABDOMEN ACUTE W/ 1V CHEST
4 series · 4 of 4 positions shown · non-contrast
Comparison: Chest [DATE]

CLINICAL DATA: Abdominal pain with nausea and vomiting

ACUTE ABDOMEN SERIES (ABDOMEN 2 VIEW & CHEST 1 VIEW)

[w chest pa]
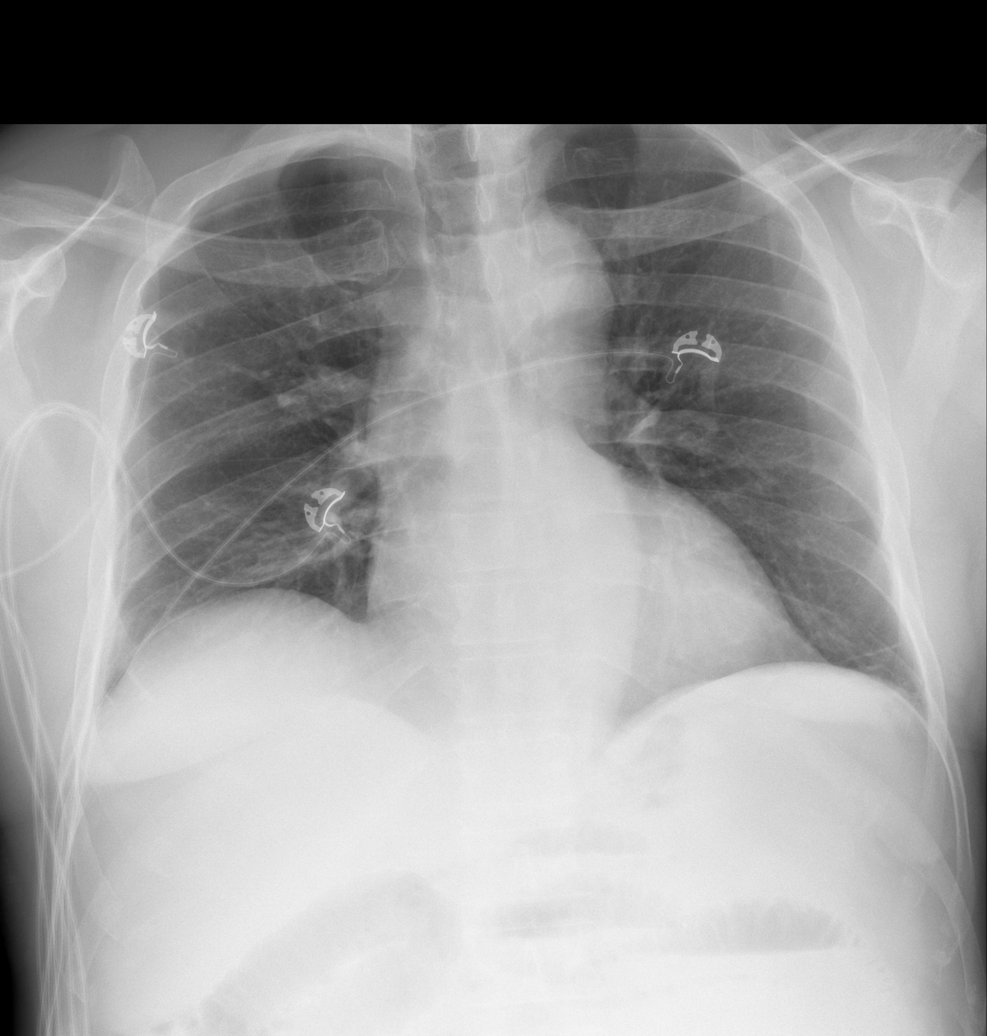

[w abdomen upright (1 of 2)]
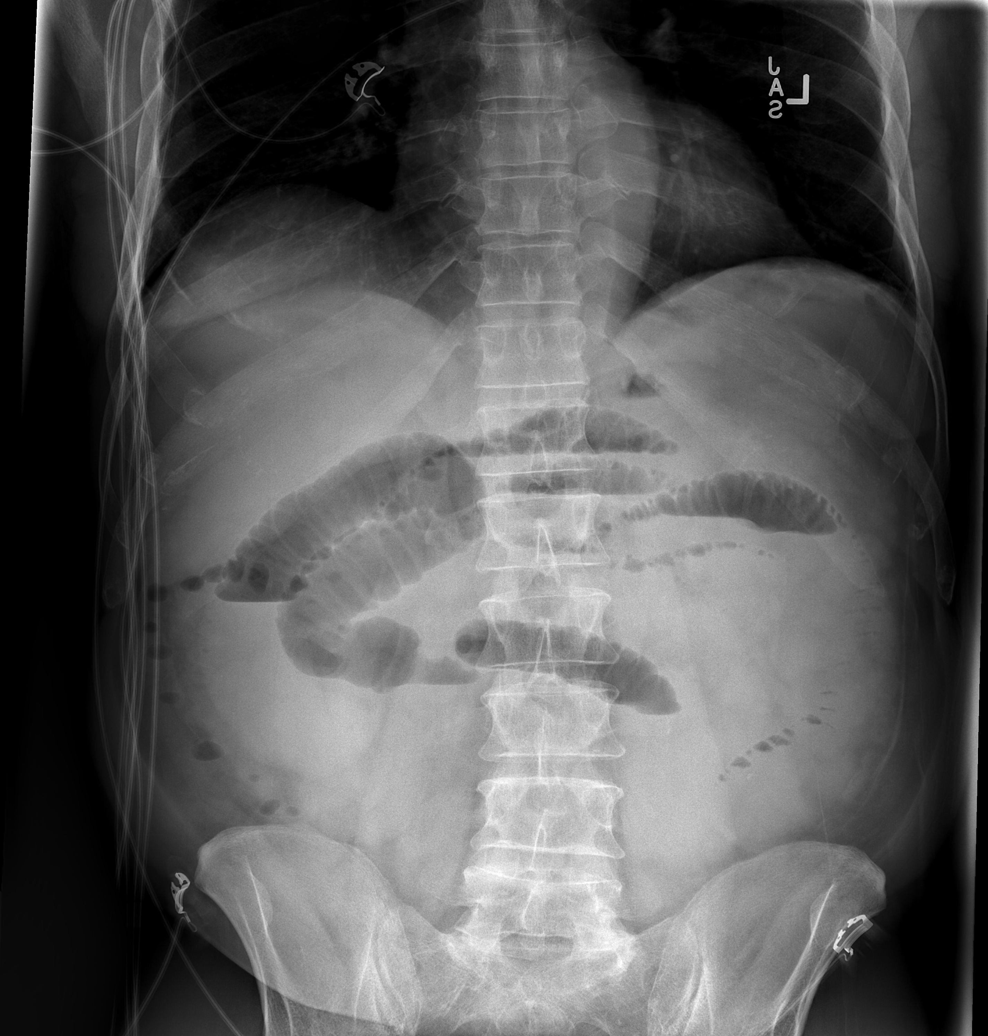

[w abdomen upright (2 of 2)]
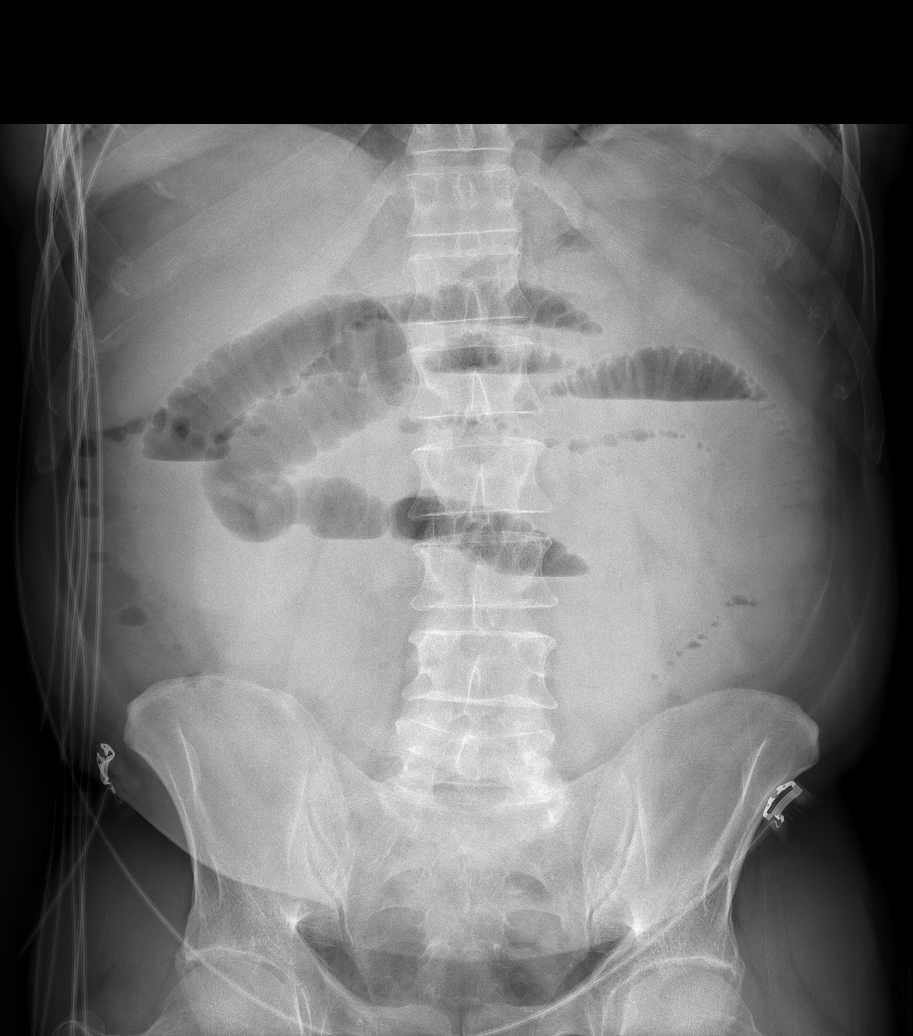

[t abdomen supine]
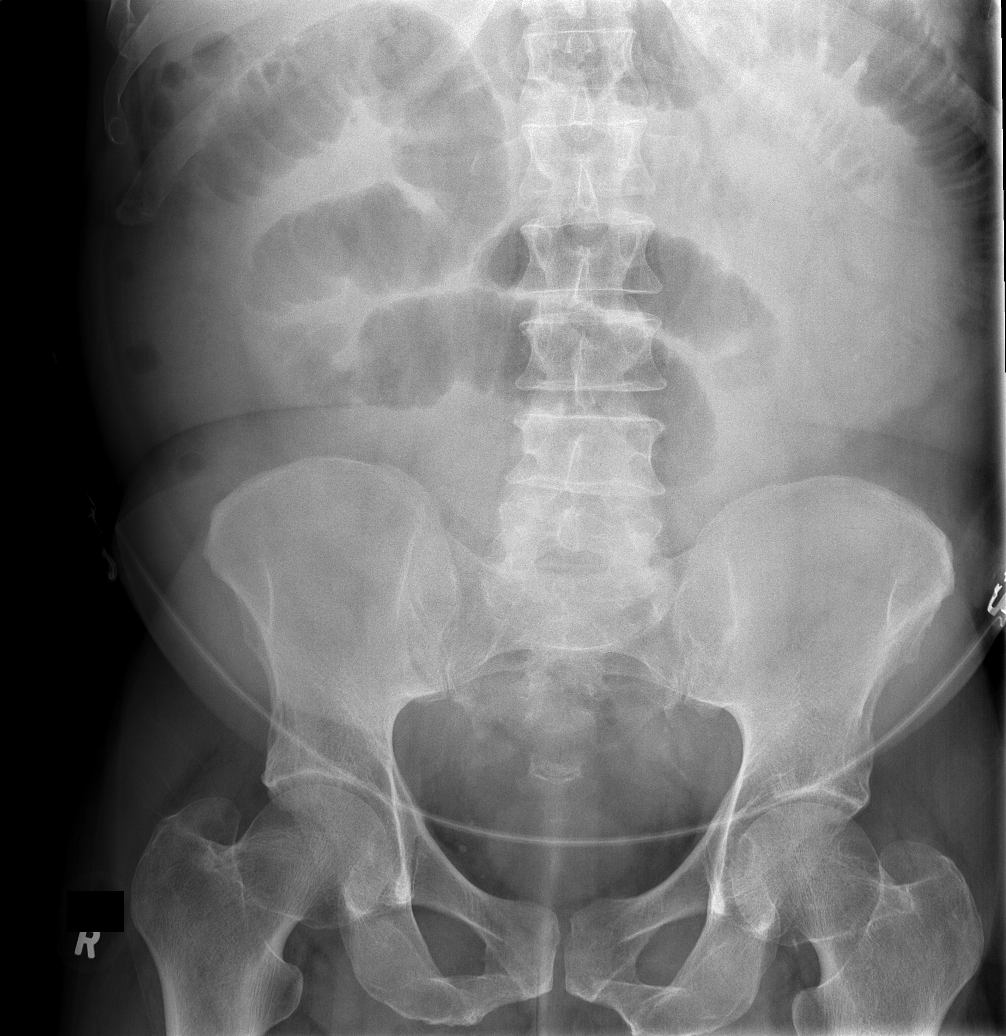

[4 of 4 positions shown; findings below may reference images not displayed]

FINDINGS: Lungs are clear without infiltrate or effusion.  Negative
for heart failure.

Dilated small bowel with air-fluid levels compatible with small
bowel obstruction.  Colon is decompressed.  No free air.
IMPRESSION: Small bowel obstruction.

## 2012-04-01 MED ORDER — PANTOPRAZOLE SODIUM 40 MG IV SOLR
40.0000 mg | Freq: Every day | INTRAVENOUS | Status: DC
  Administered 2012-04-01 – 2012-04-07 (×7): 40 mg via INTRAVENOUS
  Filled 2012-04-01 (×8): qty 40

## 2012-04-01 MED ORDER — BIOTENE DRY MOUTH MT LIQD
15.0000 mL | Freq: Two times a day (BID) | OROMUCOSAL | Status: DC
  Administered 2012-04-02 – 2012-04-05 (×6): 15 mL via OROMUCOSAL

## 2012-04-01 MED ORDER — CHLORHEXIDINE GLUCONATE 0.12 % MT SOLN
15.0000 mL | Freq: Two times a day (BID) | OROMUCOSAL | Status: DC
  Administered 2012-04-02 – 2012-04-06 (×9): 15 mL via OROMUCOSAL
  Filled 2012-04-01 (×9): qty 15

## 2012-04-01 MED ORDER — SODIUM CHLORIDE 0.9 % IV SOLN
1000.0000 mL | Freq: Once | INTRAVENOUS | Status: AC
  Administered 2012-04-01: 1000 mL via INTRAVENOUS

## 2012-04-01 MED ORDER — ONDANSETRON HCL 4 MG/2ML IJ SOLN
4.0000 mg | Freq: Four times a day (QID) | INTRAMUSCULAR | Status: DC | PRN
  Administered 2012-04-01: 4 mg via INTRAVENOUS
  Filled 2012-04-01: qty 2

## 2012-04-01 MED ORDER — SODIUM CHLORIDE 0.9 % IV SOLN
1000.0000 mL | INTRAVENOUS | Status: DC
  Administered 2012-04-01: 1000 mL via INTRAVENOUS

## 2012-04-01 MED ORDER — ONDANSETRON 4 MG PO TBDP
8.0000 mg | ORAL_TABLET | Freq: Once | ORAL | Status: AC
  Administered 2012-04-01: 8 mg via ORAL

## 2012-04-01 MED ORDER — ONDANSETRON HCL 4 MG/2ML IJ SOLN: 4.0000 mg | Freq: Once | INTRAMUSCULAR | Status: DC

## 2012-04-01 MED ORDER — LABETALOL HCL 5 MG/ML IV SOLN
10.0000 mg | INTRAVENOUS | Status: DC | PRN
  Filled 2012-04-01: qty 4

## 2012-04-01 MED ORDER — SODIUM CHLORIDE 0.9 % IV SOLN
1000.0000 mL | INTRAVENOUS | Status: DC
  Administered 2012-04-01 – 2012-04-04 (×5): 1000 mL via INTRAVENOUS

## 2012-04-01 MED ORDER — ONDANSETRON 4 MG PO TBDP
ORAL_TABLET | ORAL | Status: AC
  Filled 2012-04-01: qty 2

## 2012-04-01 MED ORDER — HYDROMORPHONE HCL PF 1 MG/ML IJ SOLN: 1.0000 mg | INTRAMUSCULAR | Status: DC | PRN

## 2012-04-01 NOTE — ED Notes (Signed)
Pt had hernia repair on 11/19 at [facility] by dr wakefield. Reports only 1 BM since the operation and since Thursday n/v, unable to tolerate oral intake, and abd cramps. Pt reports he is getting ready to start dialysis.

## 2012-04-01 NOTE — ED Provider Notes (Signed)
History     CSN: [id number]  Arrival date & time 04/01/12  1000   First MD Initiated Contact with Patient 04/01/12 1153      Chief Complaint  Patient presents with  . Abdominal Pain    (Consider location/radiation/quality/duration/timing/severity/associated sxs/prior treatment) HPI  Anshuman Alamillo is a 56 y.o. male umbilical hernia repair by Dr. Donne Hazel on November 19. Patient reports he's only had one bowel movement since that operation. Denies passage of gas. He's had  multiple episodes of nonbloody nonbilious emesis over the last 4 days. He is unable to tolerate any by mouth intake at this time he endorses a distended abdomen with cramping. Denies pain refuses pain medication at this time. Past medical history is significant for hypertension ESRD is scheduled to start hemodialysis this month. Patient denies any chest pain, shortness of breath, leg swelling.  Past Medical History  Diagnosis Date  . Hypertension   . Hyperparathyroidism, secondary renal   . H/O cardiac catheterization   . Hyperlipidemia   . Fatigue   . Chronic kidney disease     Polycystic kidney disease, not on diaylsis yet  . Kidney stones     pased one, One [facility]   . ESRD (end stage renal disease)     left av fistula - not on dialysis yet    Past Surgical History  Procedure Date  . Appendectomy   . Cardiac catheterization 04-05-2010    checking for blockage but none-[facility]  . Av fistula placement 12/05/2011    Procedure: ARTERIOVENOUS (AV) FISTULA CREATION;  Surgeon: Conrad [place], MD;  Location: [place];  Service: Vascular;  Laterality: Left;  RADIO-CEPHALIC  fistula left arm  . Av fistula placement 01/11/2012    Procedure: ARTERIOVENOUS (AV) FISTULA CREATION;  Surgeon: Conrad Despard, MD;  Location: [place];  Service: Vascular;  Laterality: Left;  Creation of left brachial cephalic arteriovenous fistula  . Umbilical hernia repair [id number]    Procedure: HERNIA REPAIR UMBILICAL ADULT;  Surgeon: [name], MD;  Location: [place];  Service: General;  Laterality: N/A;  . Insertion of mesh 03/20/2012    Procedure: INSERTION OF MESH;  Surgeon: [name], MD;  Location: [place];  Service: General;  Laterality: N/A;  . Hernia repair     Family History  Problem Relation Age of Onset  . Heart disease Mother   . Hyperlipidemia Mother   . Hypertension Mother   . Kidney disease Father   . Stroke Father   . Kidney disease Brother     History  Substance Use Topics  . Smoking status: Never Smoker   . Smokeless tobacco: Never Used  . Alcohol Use: No      Review of Systems  Allergies  Lipitor and Uloric  Home Medications   Current Outpatient Rx  Name  Route  Sig  Dispense  Refill  . ALLOPURINOL 100 MG PO TABS   Oral   Take 200 mg by mouth daily.         [name] AMLODIPINE BESYLATE 5 MG PO TABS   Oral   Take 10 mg by mouth 2 (two) times daily.          . COLCHICINE 0.6 MG PO TABS   Oral   Take 0.6 mg by mouth as needed. Gout flare         . FUROSEMIDE 40 MG PO TABS   Oral   Take 40 mg by mouth daily.         [name]  HYDROCODONE-ACETAMINOPHEN 5-325 MG PO TABS   Oral   Take 1 tablet by mouth every 6 (six) hours as needed for pain.   30 tablet   0   . MAGNESIUM HYDROXIDE 400 MG/5ML PO SUSP   Oral   Take 30 mLs by mouth daily as needed. For stomach         . METOPROLOL SUCCINATE ER 100 MG PO TB24   Oral   Take 100 mg by mouth daily. Take with or immediately following a meal.         . OXYCODONE-ACETAMINOPHEN 5-325 MG PO TABS   Oral   Take 1 tablet by mouth every 4 (four) hours as needed for pain.   30 tablet   0   . PARICALCITOL 1 MCG PO CAPS   Oral   Take 1 mcg by mouth daily.         [name] SPIRONOLACTONE 25 MG PO TABS   Oral   Take 25 mg by mouth daily.           BP 139/99  Pulse 115  Temp 98.4 F (36.9 C) (Oral)  Resp 22  SpO2 99%  Physical Exam  Nursing note and vitals reviewed. Constitutional: He is oriented to person, place, and  time. He appears well-developed and well-nourished. No distress.  HENT:  Head: Normocephalic.       Dry mucous membranes  Eyes: Conjunctivae normal and EOM are normal. Pupils are equal, round, and reactive to light.  Neck: Normal range of motion.  Cardiovascular: Normal heart sounds and intact distal pulses.        Tachycardia in the one teens  Fistula to the left a.c. with good thrill.  Pulmonary/Chest: Effort normal and breath sounds normal. No stridor.  Abdominal: Soft. He exhibits distension. He exhibits no mass. There is no tenderness. There is no rebound and no guarding.  Musculoskeletal: Normal range of motion.  Neurological: He is alert and oriented to person, place, and time.  Psychiatric: He has a normal mood and affect.    ED Course  Procedures (including critical care time)  Labs Reviewed  CBC WITH DIFFERENTIAL - Abnormal; Notable for the following:    WBC 12.7 (*)     MCHC 36.4 (*)     Neutrophils Relative 80 (*)     Neutro Abs 10.1 (*)     All other components within normal limits  COMPREHENSIVE METABOLIC PANEL - Abnormal; Notable for the following:    Glucose, Bld 119 (*)     BUN 58 (*)     Creatinine, Ser 5.54 (*)     Calcium 11.2 (*)     Total Protein 8.8 (*)     GFR calc non Af Amer 11 (*)     GFR calc Af Amer 12 (*)     All other components within normal limits  LIPASE, BLOOD - Abnormal; Notable for the following:    Lipase 107 (*)     All other components within normal limits  URINALYSIS, MICROSCOPIC ONLY   Dg Abd Acute W/chest  04/01/2012  *RADIOLOGY REPORT*  Clinical Data: Abdominal pain with nausea and vomiting  ACUTE ABDOMEN SERIES (ABDOMEN 2 VIEW & CHEST 1 VIEW)  Comparison: Chest 12/05/2011  Findings: Lungs are clear without infiltrate or effusion.  Negative for heart failure.  Dilated small bowel with air-fluid levels compatible with small bowel obstruction.  Colon is decompressed.  No free air.  IMPRESSION: Small bowel obstruction.   Original  Report Authenticated By:  Carl Best, M.D.      1. SBO (small bowel obstruction)       MDM  Likely postop small bowel obstruction. Patient has been taking 5 mg of Norco approximately 4 times a day. Abdomen is distended, nontender  Patient is tachycardic with dry mucous membranes he will be gently hydrated due to his ESRD.  Acute abdominal series shows SBO with decompressed colon no free air.  Surgical consult from Dr. Brantley Stage appreciated. He will come to light with patient. He has requested nephrology consult as well.  Consult from nephrologist Dr. Hassell Done appreciated he will evaluate the patient tomorrow.     Monico Blitz, PA-C 04/01/12 1432

## 2012-04-01 NOTE — ED Notes (Signed)
PT unable to void at this time. Pt reports decreased urine out put due to renal failure.

## 2012-04-01 NOTE — Progress Notes (Signed)
Reviewed CT.  Probable SBO at umbilicus at repair site.  Pt is comfortable.  Discussed the probable need for ex lap at this time vs observation and NGT.  Pt does not want to proceed to OR tonight even though I feel he has a high likelhood of needing ex lap to reslove his problem.  He would like to try conservative management but quoted a 70 % failure rate of this. He has no signs of peritonitis.

## 2012-04-01 NOTE — H&P (Signed)
Michael [name] is an 56 y.o. male.   Chief Complaint: nausea and vomiting HPI: Asked to see pt at request of EDP for 4 day history of nausea vomiting and abdominal pain. Started Thursday.  Last BM Thursday.  No BM or flatus.  Took MOM without result.  Plain films show possible SBO.  11 days s/p umbilical hernia repair by Dr Donne Hazel.  Has ESRD and is on transplant list.  Not on HD yet and make urine.    Past Medical History  Diagnosis Date  . Hypertension   . Hyperparathyroidism, secondary renal   . H/O cardiac catheterization   . Hyperlipidemia   . Fatigue   . Chronic kidney disease     Polycystic kidney disease, not on diaylsis yet  . Kidney stones     pased one, One [place]azer   . ESRD (end stage renal disease)     left av fistula - not on dialysis yet    Past Surgical History  Procedure Date  . Appendectomy   . Cardiac catheterization 04-05-2010    checking for blockage but none-[facility]  . Av fistula placement 12/05/2011    Procedure: ARTERIOVENOUS (AV) FISTULA CREATION;  Surgeon: Conrad Bowers, MD;  Location: [place];  Service: Vascular;  Laterality: Left;  RADIO-CEPHALIC  fistula left arm  . Av fistula placement 01/11/2012    Procedure: ARTERIOVENOUS (AV) FISTULA CREATION;  Surgeon: Conrad [place], MD;  Location: [place];  Service: Vascular;  Laterality: Left;  Creation of left brachial cephalic arteriovenous fistula  . Umbilical hernia repair [id number]    Procedure: HERNIA REPAIR UMBILICAL ADULT;  Surgeon: [name], MD;  Location: [place];  Service: General;  Laterality: N/A;  . Insertion of mesh 03/20/2012    Procedure: INSERTION OF MESH;  Surgeon: [name], MD;  Location: [place];  Service: General;  Laterality: N/A;  . Hernia repair     Family History  Problem Relation Age of Onset  . Heart disease Mother   . Hyperlipidemia Mother   . Hypertension Mother   . Kidney disease Father   . Stroke Father   . Kidney disease Brother    Social History:  reports that he has  never smoked. He has never used smokeless tobacco. He reports that he does not drink alcohol or use illicit drugs.  Allergies:  Allergies  Allergen Reactions  . Lipitor (Atorvastatin) Itching    Leg pain  . Uloric (Febuxostat)     Leg pain     (Not in a hospital admission)  Results for orders placed during the hospital encounter of 04/01/12 (from the past 48 hour(s))  CBC WITH DIFFERENTIAL     Status: Abnormal   Collection Time   04/01/12 10:36 AM      Component Value Range Comment   WBC 12.7 (*) 4.0 - 10.5 K/uL    RBC 5.09  4.22 - 5.81 MIL/uL    Hemoglobin 14.8  13.0 - 17.0 g/dL    HCT 40.7  39.0 - 52.0 %    MCV 80.0  78.0 - 100.0 fL    MCH 29.1  26.0 - 34.0 pg    MCHC 36.4 (*) 30.0 - 36.0 g/dL    RDW 13.0  11.5 - 15.5 %    Platelets 359  150 - 400 K/uL    Neutrophils Relative 80 (*) 43 - 77 %    Neutro Abs 10.1 (*) 1.7 - 7.7 K/uL    Lymphocytes Relative 14  12 - 46 %  Lymphs Abs 1.8  0.7 - 4.0 K/uL    Monocytes Relative 6  3 - 12 %    Monocytes Absolute 0.7  0.1 - 1.0 K/uL    Eosinophils Relative 1  0 - 5 %    Eosinophils Absolute 0.1  0.0 - 0.7 K/uL    Basophils Relative 0  0 - 1 %    Basophils Absolute 0.0  0.0 - 0.1 K/uL   COMPREHENSIVE METABOLIC PANEL     Status: Abnormal   Collection Time   04/01/12 10:36 AM      Component Value Range Comment   Sodium 137  135 - 145 mEq/L    Potassium 5.0  3.5 - 5.1 mEq/L    Chloride 96  96 - 112 mEq/L    CO2 24  19 - 32 mEq/L    Glucose, Bld 119 (*) 70 - 99 mg/dL    BUN 58 (*) 6 - 23 mg/dL    Creatinine, Ser 5.54 (*) 0.50 - 1.35 mg/dL    Calcium 11.2 (*) 8.4 - 10.5 mg/dL    Total Protein 8.8 (*) 6.0 - 8.3 g/dL    Albumin 4.8  3.5 - 5.2 g/dL    AST 19  0 - 37 U/L    ALT 18  0 - 53 U/L    Alkaline Phosphatase 63  39 - 117 U/L    Total Bilirubin 0.9  0.3 - 1.2 mg/dL    GFR calc non Af Amer 11 (*) >90 mL/min    GFR calc Af Amer 12 (*) >90 mL/min   LIPASE, BLOOD     Status: Abnormal   Collection Time   04/01/12 10:36 AM        Component Value Range Comment   Lipase 107 (*) 11 - 59 U/L   URINALYSIS, MICROSCOPIC ONLY     Status: Abnormal   Collection Time   04/01/12  1:11 PM      Component Value Range Comment   Color, Urine YELLOW  YELLOW    APPearance CLOUDY (*) CLEAR    Specific Gravity, Urine 1.016  1.005 - 1.030    pH 5.0  5.0 - 8.0    Glucose, UA NEGATIVE  NEGATIVE mg/dL    Hgb urine dipstick NEGATIVE  NEGATIVE    Bilirubin Urine SMALL (*) NEGATIVE    Ketones, ur 15 (*) NEGATIVE mg/dL    Protein, ur 100 (*) NEGATIVE mg/dL    Urobilinogen, UA 0.2  0.0 - 1.0 mg/dL    Nitrite NEGATIVE  NEGATIVE    Leukocytes, UA TRACE (*) NEGATIVE    WBC, UA 0-2  <3 WBC/hpf    Dg Abd Acute W/chest  04/01/2012  *RADIOLOGY REPORT*  Clinical Data: Abdominal pain with nausea and vomiting  ACUTE ABDOMEN SERIES (ABDOMEN 2 VIEW & CHEST 1 VIEW)  Comparison: Chest 12/05/2011  Findings: Lungs are clear without infiltrate or effusion.  Negative for heart failure.  Dilated small bowel with air-fluid levels compatible with small bowel obstruction.  Colon is decompressed.  No free air.  IMPRESSION: Small bowel obstruction.   Original Report Authenticated By: Carl Best, M.D.     Review of Systems  Constitutional: Negative for fever and chills.  HENT: Negative.   Eyes: Negative.   Respiratory: Negative.   Cardiovascular: Negative.   Gastrointestinal: Positive for nausea, vomiting and abdominal pain.  Genitourinary: Negative.   Musculoskeletal: Negative.   Skin: Negative.   Neurological: Negative.   Endo/Heme/Allergies: Negative.   Psychiatric/Behavioral: Negative.  Blood pressure 144/83, pulse 93, temperature 98.4 F (36.9 C), temperature source Oral, resp. rate 25, SpO2 97.00%. Physical Exam  Constitutional: He is oriented to person, place, and time. He appears well-developed and well-nourished.  HENT:  Head: Normocephalic and atraumatic.  Eyes: EOM are normal. Pupils are equal, round, and reactive to light.  Neck:  Normal range of motion.  Cardiovascular: Normal rate and regular rhythm.   Respiratory: Effort normal and breath sounds normal.  GI: He exhibits distension.       Distended quiet and minimally tender diffusely.  No peritonitis. Incision C/D/I  Musculoskeletal: Normal range of motion.  Neurological: He is alert and oriented to person, place, and time. He has normal reflexes.  Skin: Skin is warm and dry.  Psychiatric: He has a normal mood and affect. His behavior is normal. Judgment and thought content normal.     Assessment/Plan S/P UMBILICAL HERNIA REPAIR 11 DAYS AGO WITH pSBO or SBO VS CONSTIPATION  Admit  Nephrology has been consulted Check CT without IV contrast Gentle hydration NGT  Virgen Belland A. 04/01/2012, 2:36 PM

## 2012-04-01 NOTE — ED Provider Notes (Signed)
Medical screening examination/treatment/procedure(s) were performed by non-physician practitioner and as supervising physician I was immediately available for consultation/collaboration.   Malvin Johns, MD 04/01/12 1515

## 2012-04-01 NOTE — Progress Notes (Signed)
Pt arrived to 6N 18 from the ED via stretcher.  Pt ambulated from the stretcher to the bed without any difficulty.  VSS.  Oriented pt to dept, room, and call bell.  Wife at bedside.  Will cont to monitor.

## 2012-04-02 ENCOUNTER — Encounter (HOSPITAL_COMMUNITY)
Admission: EM | Disposition: A | Discharge status: Home / Self Care | Payer: Self-pay | Source: Home / Self Care | Attending: General Surgery

## 2012-04-02 ENCOUNTER — Inpatient Hospital Stay (HOSPITAL_COMMUNITY): Payer: BC Managed Care – PPO

## 2012-04-02 ENCOUNTER — Encounter (HOSPITAL_COMMUNITY): Payer: Self-pay | Admitting: Anesthesiology

## 2012-04-02 ENCOUNTER — Encounter (HOSPITAL_COMMUNITY): Payer: Self-pay | Admitting: *Deleted

## 2012-04-02 ENCOUNTER — Inpatient Hospital Stay (HOSPITAL_COMMUNITY): Payer: BC Managed Care – PPO | Admitting: Anesthesiology

## 2012-04-02 DIAGNOSIS — K565 Intestinal adhesions [bands], unspecified as to partial versus complete obstruction: Secondary | ICD-10-CM

## 2012-04-02 HISTORY — PX: LAPAROTOMY: SHX154

## 2012-04-02 LAB — COMPREHENSIVE METABOLIC PANEL
ALT: 11 U/L (ref 0–53)
AST: 13 U/L (ref 0–37)
Albumin: 3.7 g/dL (ref 3.5–5.2)
Alkaline Phosphatase: 48 U/L (ref 39–117)
BUN: 55 mg/dL — ABNORMAL HIGH (ref 6–23)
CO2: 22 mEq/L (ref 19–32)
Calcium: 9 mg/dL (ref 8.4–10.5)
Chloride: 107 mEq/L (ref 96–112)
Creatinine, Ser: 5.34 mg/dL — ABNORMAL HIGH (ref 0.50–1.35)
GFR calc Af Amer: 13 mL/min — ABNORMAL LOW (ref >90)
GFR calc non Af Amer: 11 mL/min — ABNORMAL LOW (ref >90)
Glucose, Bld: 91 mg/dL (ref 70–99)
Potassium: 4.4 mEq/L (ref 3.5–5.1)
Sodium: 142 mEq/L (ref 135–145)
Total Bilirubin: 0.7 mg/dL (ref 0.3–1.2)
Total Protein: 6.9 g/dL (ref 6.0–8.3)

## 2012-04-02 LAB — CBC
HCT: 34.7 % — ABNORMAL LOW (ref 39.0–52.0)
Hemoglobin: 12.1 g/dL — ABNORMAL LOW (ref 13.0–17.0)
MCH: 28.8 pg (ref 26.0–34.0)
MCHC: 34.9 g/dL (ref 30.0–36.0)
MCV: 82.6 fL (ref 78.0–100.0)
Platelets: 255 10*3/uL (ref 150–400)
RBC: 4.2 MIL/uL — ABNORMAL LOW (ref 4.22–5.81)
RDW: 13.4 % (ref 11.5–15.5)
WBC: 7.5 10*3/uL (ref 4.0–10.5)

## 2012-04-02 IMAGING — CR DG ABDOMEN 2V
2 series · 2 of 2 positions shown · non-contrast
Comparison: CT abdomen pelvis of [DATE]

CLINICAL DATA: Small bowel obstruction, follow-up, abdominal pain

ABDOMEN - 2 VIEW

[w abdomen upright]
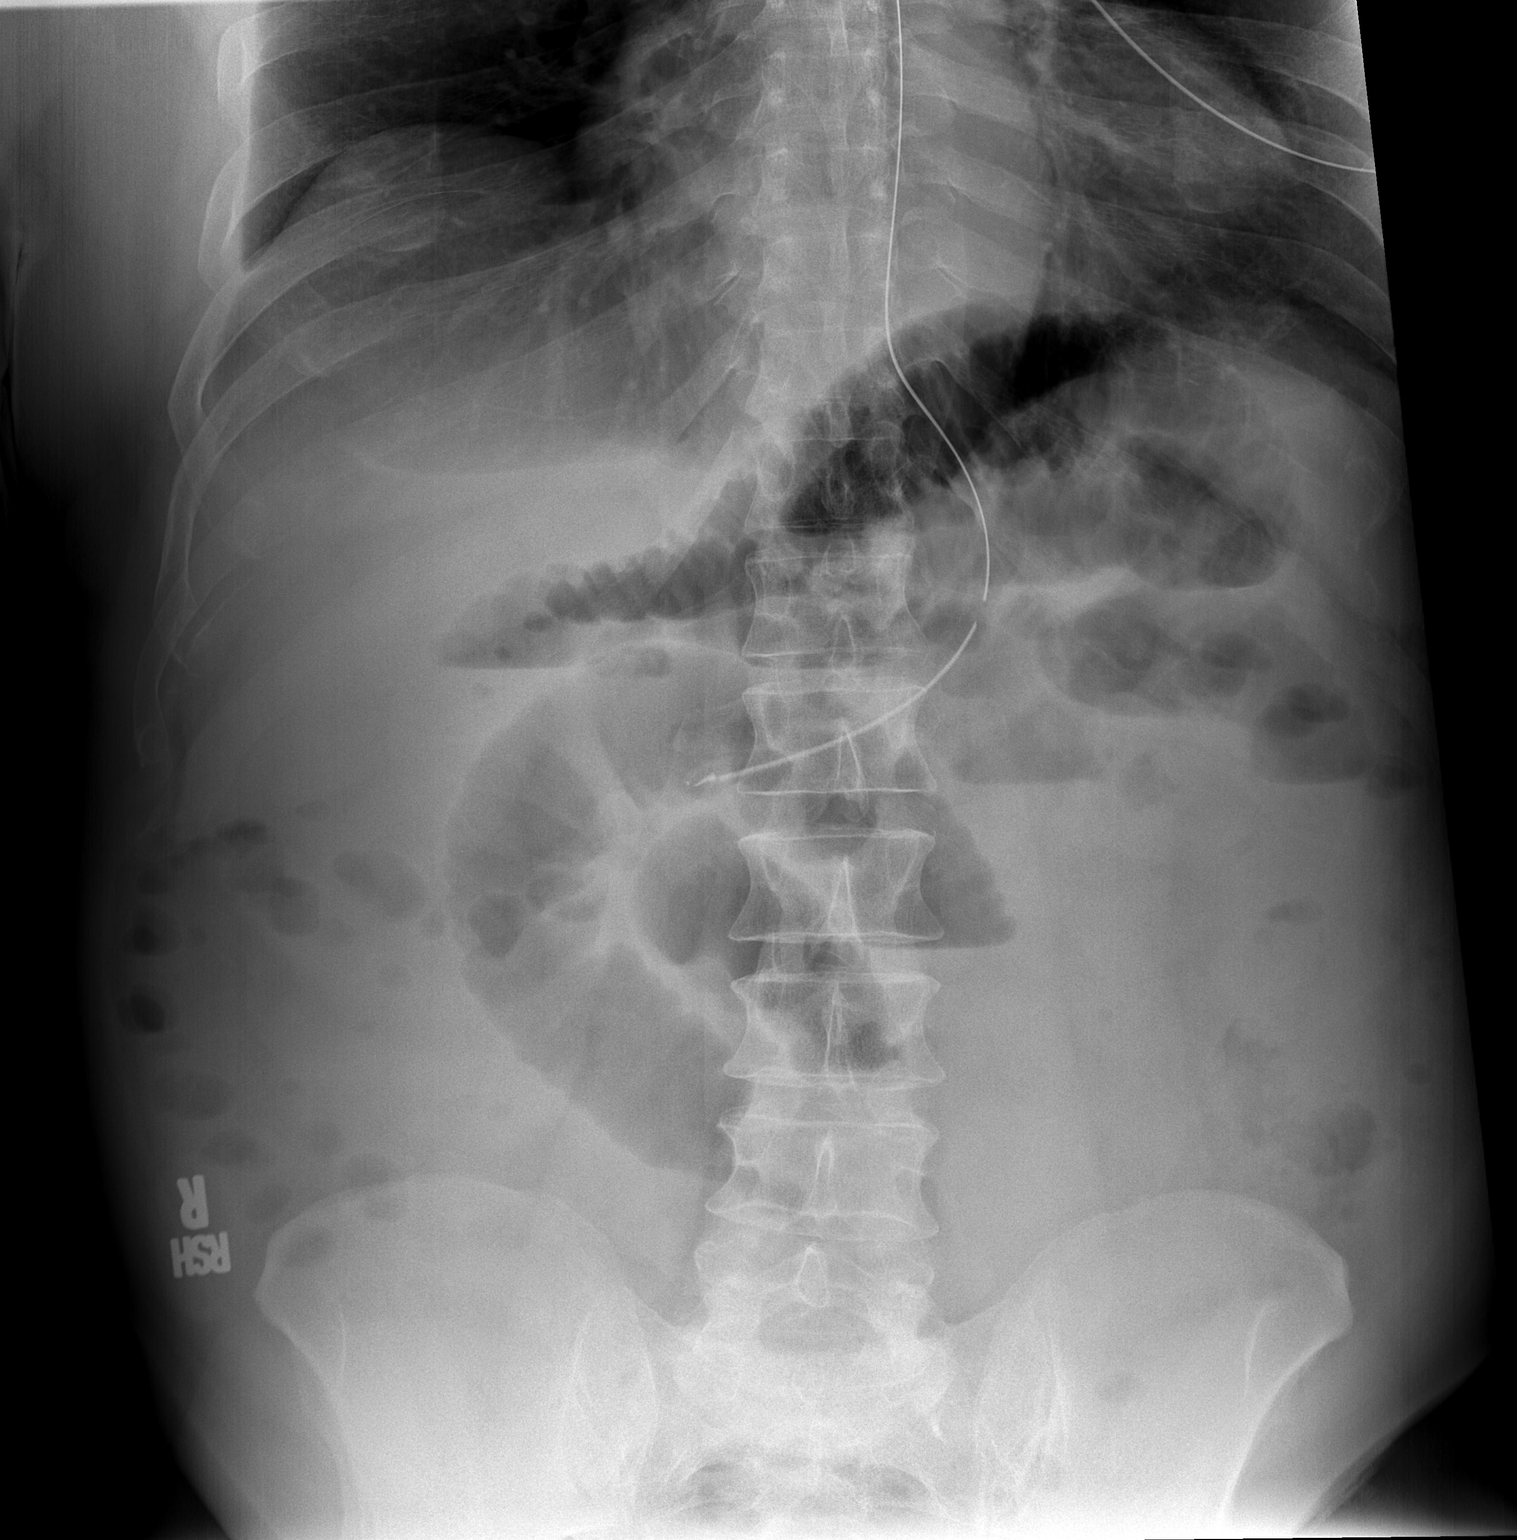

[t abdomen supine]
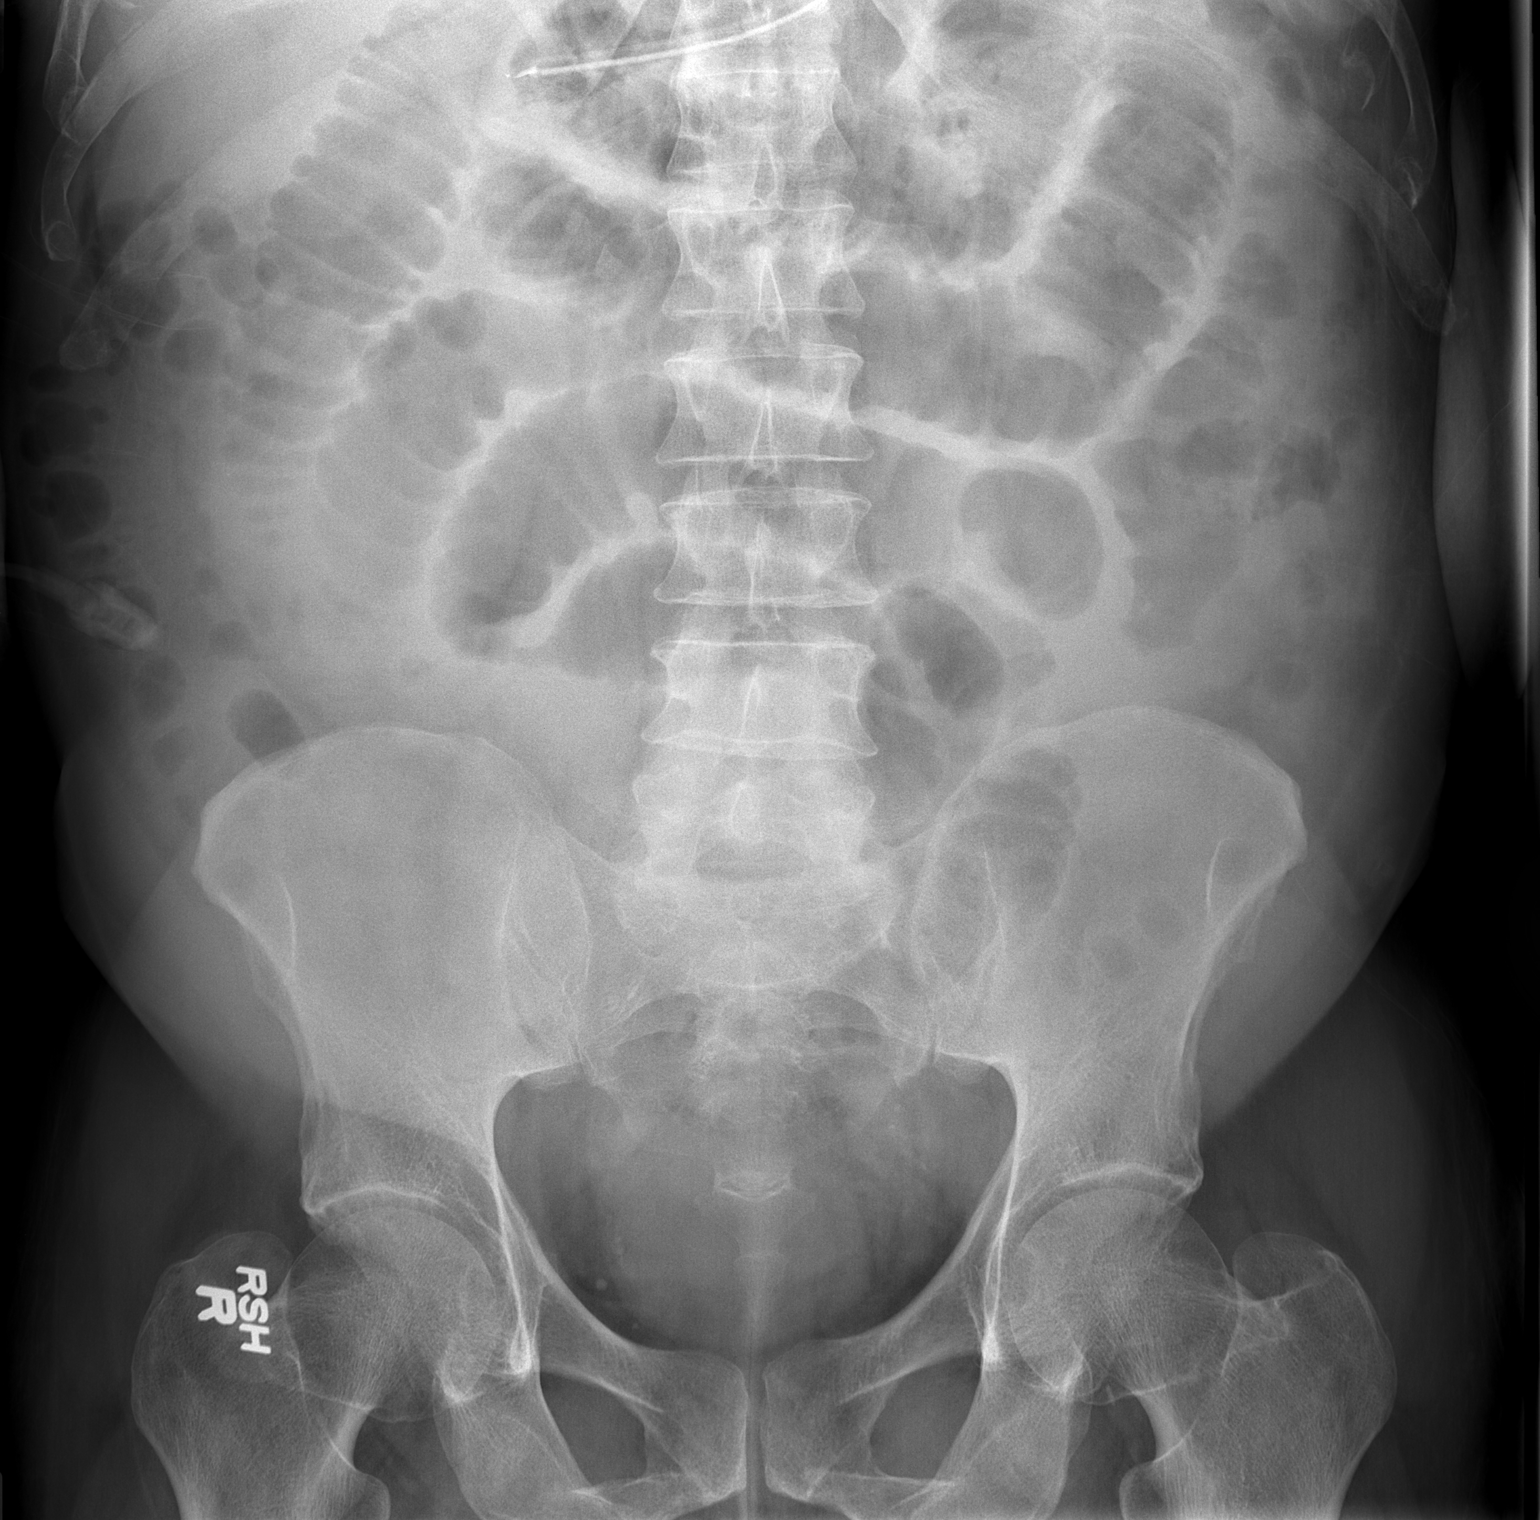

[2 of 2 positions shown; findings below may reference images not displayed]

FINDINGS: There are still dilated loops of small bowel with
scattered air-fluid levels consistent with persistent partial small
bowel obstruction.  An NG tube is present with the tip in the
region of the distal body of the stomach.  No free air is seen.
IMPRESSION: Persistent small bowel obstruction.  NG tube tip in region of
distal body of stomach.

## 2012-04-02 SURGERY — LAPAROTOMY, EXPLORATORY
Anesthesia: General | Site: Abdomen | Wound class: Clean Contaminated

## 2012-04-02 MED ORDER — DIPHENHYDRAMINE HCL 50 MG/ML IJ SOLN: 12.5000 mg | Freq: Four times a day (QID) | INTRAMUSCULAR | Status: DC | PRN

## 2012-04-02 MED ORDER — HYDROMORPHONE HCL PF 1 MG/ML IJ SOLN
INTRAMUSCULAR | Status: AC
  Administered 2012-04-02: 0.5 mg via INTRAVENOUS
  Filled 2012-04-02: qty 1

## 2012-04-02 MED ORDER — DEXTROSE 5 % IV SOLN
INTRAVENOUS | Status: DC | PRN
  Administered 2012-04-02: 18:00:00 via INTRAVENOUS

## 2012-04-02 MED ORDER — MORPHINE SULFATE (PF) 1 MG/ML IV SOLN
INTRAVENOUS | Status: AC
  Filled 2012-04-02: qty 25

## 2012-04-02 MED ORDER — ONDANSETRON HCL 4 MG/2ML IJ SOLN
INTRAMUSCULAR | Status: DC | PRN
  Administered 2012-04-02: 4 mg via INTRAVENOUS

## 2012-04-02 MED ORDER — ONDANSETRON HCL 4 MG/2ML IJ SOLN: 4.0000 mg | Freq: Four times a day (QID) | INTRAMUSCULAR | Status: DC | PRN

## 2012-04-02 MED ORDER — MORPHINE SULFATE (PF) 1 MG/ML IV SOLN
INTRAVENOUS | Status: DC
  Administered 2012-04-02: 20:00:00 via INTRAVENOUS
  Administered 2012-04-03: 5 mg via INTRAVENOUS
  Administered 2012-04-03: 11 mg via INTRAVENOUS

## 2012-04-02 MED ORDER — ROCURONIUM BROMIDE 100 MG/10ML IV SOLN
INTRAVENOUS | Status: DC | PRN
  Administered 2012-04-02: 10 mg via INTRAVENOUS
  Administered 2012-04-02: 40 mg via INTRAVENOUS

## 2012-04-02 MED ORDER — MIDAZOLAM HCL 5 MG/5ML IJ SOLN
INTRAMUSCULAR | Status: DC | PRN
Start: 2012-04-02 — End: 2012-04-02
  Administered 2012-04-02 (×2): 1 mg via INTRAVENOUS

## 2012-04-02 MED ORDER — DEXTROSE 5 % IV SOLN
1.0000 g | Freq: Three times a day (TID) | INTRAVENOUS | Status: AC
  Administered 2012-04-02 – 2012-04-03 (×3): 1 g via INTRAVENOUS
  Filled 2012-04-02 (×3): qty 1

## 2012-04-02 MED ORDER — ACETAMINOPHEN 650 MG RE SUPP: 650.0000 mg | Freq: Four times a day (QID) | RECTAL | Status: DC | PRN

## 2012-04-02 MED ORDER — FENTANYL CITRATE 0.05 MG/ML IJ SOLN
INTRAMUSCULAR | Status: DC | PRN
  Administered 2012-04-02 (×2): 50 ug via INTRAVENOUS
  Administered 2012-04-02: 100 ug via INTRAVENOUS
  Administered 2012-04-02: 50 ug via INTRAVENOUS

## 2012-04-02 MED ORDER — LABETALOL HCL 5 MG/ML IV SOLN
INTRAVENOUS | Status: DC | PRN
  Administered 2012-04-02: 5 mg via INTRAVENOUS

## 2012-04-02 MED ORDER — DEXTROSE 5 % IV SOLN
INTRAVENOUS | Status: AC
  Filled 2012-04-02 (×2): qty 1

## 2012-04-02 MED ORDER — HEPARIN SODIUM (PORCINE) 5000 UNIT/ML IJ SOLN
5000.0000 [IU] | Freq: Three times a day (TID) | INTRAMUSCULAR | Status: DC
  Administered 2012-04-02 – 2012-04-09 (×20): 5000 [IU] via SUBCUTANEOUS
  Filled 2012-04-02 (×23): qty 1

## 2012-04-02 MED ORDER — VECURONIUM BROMIDE 10 MG IV SOLR
INTRAVENOUS | Status: DC | PRN
  Administered 2012-04-02: 1 mg via INTRAVENOUS

## 2012-04-02 MED ORDER — SODIUM CHLORIDE 0.9 % IV SOLN
INTRAVENOUS | Status: DC
  Administered 2012-04-02: 17:00:00 via INTRAVENOUS

## 2012-04-02 MED ORDER — ESMOLOL HCL 10 MG/ML IV SOLN
INTRAVENOUS | Status: DC | PRN
  Administered 2012-04-02: 5 mg via INTRAVENOUS

## 2012-04-02 MED ORDER — GLYCOPYRROLATE 0.2 MG/ML IJ SOLN
INTRAMUSCULAR | Status: DC | PRN
  Administered 2012-04-02: .6 mg via INTRAVENOUS

## 2012-04-02 MED ORDER — ONDANSETRON HCL 4 MG/2ML IJ SOLN: 4.0000 mg | Freq: Once | INTRAMUSCULAR | Status: DC | PRN

## 2012-04-02 MED ORDER — ARTIFICIAL TEARS OP OINT
TOPICAL_OINTMENT | OPHTHALMIC | Status: DC | PRN
  Administered 2012-04-02: 1 via OPHTHALMIC

## 2012-04-02 MED ORDER — ACETAMINOPHEN 325 MG PO TABS: 650.0000 mg | ORAL_TABLET | Freq: Four times a day (QID) | ORAL | Status: DC | PRN

## 2012-04-02 MED ORDER — DEXTROSE 5 % IV SOLN
2.0000 g | INTRAVENOUS | Status: DC | PRN
  Administered 2012-04-02: 2 g via INTRAVENOUS

## 2012-04-02 MED ORDER — PROPOFOL 10 MG/ML IV BOLUS
INTRAVENOUS | Status: DC | PRN
  Administered 2012-04-02: 200 mg via INTRAVENOUS

## 2012-04-02 MED ORDER — NALOXONE HCL 0.4 MG/ML IJ SOLN: 0.4000 mg | INTRAMUSCULAR | Status: DC | PRN

## 2012-04-02 MED ORDER — LABETALOL HCL 5 MG/ML IV SOLN
INTRAVENOUS | Status: AC
  Administered 2012-04-02: 5 mg
  Filled 2012-04-02: qty 4

## 2012-04-02 MED ORDER — SUCCINYLCHOLINE CHLORIDE 20 MG/ML IJ SOLN
INTRAMUSCULAR | Status: DC | PRN
  Administered 2012-04-02: 100 mg via INTRAVENOUS

## 2012-04-02 MED ORDER — SODIUM CHLORIDE 0.9 % IJ SOLN: 9.0000 mL | INTRAMUSCULAR | Status: DC | PRN

## 2012-04-02 MED ORDER — HYDRALAZINE HCL 20 MG/ML IJ SOLN
10.0000 mg | INTRAMUSCULAR | Status: DC | PRN
  Filled 2012-04-02: qty 0.5

## 2012-04-02 MED ORDER — DIPHENHYDRAMINE HCL 12.5 MG/5ML PO ELIX
12.5000 mg | ORAL_SOLUTION | Freq: Four times a day (QID) | ORAL | Status: DC | PRN
  Filled 2012-04-02: qty 5

## 2012-04-02 MED ORDER — LIDOCAINE HCL (CARDIAC) 20 MG/ML IV SOLN
INTRAVENOUS | Status: DC | PRN
  Administered 2012-04-02: 50 mg via INTRAVENOUS

## 2012-04-02 MED ORDER — 0.9 % SODIUM CHLORIDE (POUR BTL) OPTIME
TOPICAL | Status: DC | PRN
  Administered 2012-04-02: 1000 mL

## 2012-04-02 MED ORDER — METOPROLOL TARTRATE 1 MG/ML IV SOLN
5.0000 mg | Freq: Four times a day (QID) | INTRAVENOUS | Status: DC
  Administered 2012-04-03 – 2012-04-06 (×14): 5 mg via INTRAVENOUS
  Filled 2012-04-02 (×18): qty 5

## 2012-04-02 MED ORDER — NEOSTIGMINE METHYLSULFATE 1 MG/ML IJ SOLN
INTRAMUSCULAR | Status: DC | PRN
  Administered 2012-04-02: 5 mg via INTRAVENOUS

## 2012-04-02 MED ORDER — HYDROMORPHONE HCL PF 1 MG/ML IJ SOLN
0.2500 mg | INTRAMUSCULAR | Status: DC | PRN
  Administered 2012-04-02: 0.5 mg via INTRAVENOUS

## 2012-04-02 SURGICAL SUPPLY — 46 items
BLADE SURG ROTATE 9660 (MISCELLANEOUS) ×1 IMPLANT
CANISTER SUCTION 2500CC (MISCELLANEOUS) ×2 IMPLANT
CHLORAPREP W/TINT 26ML (MISCELLANEOUS) ×2 IMPLANT
CLOTH BEACON ORANGE TIMEOUT ST (SAFETY) ×2 IMPLANT
COVER SURGICAL LIGHT HANDLE (MISCELLANEOUS) ×2 IMPLANT
DRAPE LAPAROSCOPIC ABDOMINAL (DRAPES) ×2 IMPLANT
DRAPE WARM FLUID 44X44 (DRAPE) ×1 IMPLANT
DRESSING TELFA 8X3 (GAUZE/BANDAGES/DRESSINGS) ×1 IMPLANT
ELECT BLADE 6.5 EXT (BLADE) IMPLANT
ELECT REM PT RETURN 9FT ADLT (ELECTROSURGICAL) ×2
ELECTRODE REM PT RTRN 9FT ADLT (ELECTROSURGICAL) ×1 IMPLANT
GLOVE BIO SURGEON STRL SZ7 (GLOVE) ×2 IMPLANT
GLOVE BIOGEL PI IND STRL 7.5 (GLOVE) ×1 IMPLANT
GLOVE BIOGEL PI INDICATOR 7.5 (GLOVE) ×1
GOWN STRL NON-REIN LRG LVL3 (GOWN DISPOSABLE) ×5 IMPLANT
KIT BASIN OR (CUSTOM PROCEDURE TRAY) ×2 IMPLANT
KIT ROOM TURNOVER OR (KITS) ×2 IMPLANT
LIGASURE IMPACT 36 18CM CVD LR (INSTRUMENTS) ×1 IMPLANT
NS IRRIG 1000ML POUR BTL (IV SOLUTION) ×4 IMPLANT
PACK GENERAL/GYN (CUSTOM PROCEDURE TRAY) ×2 IMPLANT
PAD ARMBOARD 7.5X6 YLW CONV (MISCELLANEOUS) ×2 IMPLANT
RELOAD PROXIMATE 75MM BLUE (ENDOMECHANICALS) ×4 IMPLANT
RELOAD STAPLE 75 3.8 BLU REG (ENDOMECHANICALS) IMPLANT
SPECIMEN JAR LARGE (MISCELLANEOUS) IMPLANT
SPONGE GAUZE 4X4 12PLY (GAUZE/BANDAGES/DRESSINGS) ×2 IMPLANT
SPONGE LAP 18X18 X RAY DECT (DISPOSABLE) IMPLANT
STAPLER GUN LINEAR PROX 60 (STAPLE) ×1 IMPLANT
STAPLER PROXIMATE 75MM BLUE (STAPLE) ×1 IMPLANT
STAPLER VISISTAT 35W (STAPLE) ×2 IMPLANT
SUCTION POOLE TIP (SUCTIONS) ×2 IMPLANT
SUT NOVA NAB DX-16 0-1 5-0 T12 (SUTURE) ×1 IMPLANT
SUT PDS AB 1 TP1 96 (SUTURE) ×4 IMPLANT
SUT SILK 2 0 (SUTURE) ×2
SUT SILK 2 0 SH CR/8 (SUTURE) ×2 IMPLANT
SUT SILK 2-0 18XBRD TIE 12 (SUTURE) ×1 IMPLANT
SUT SILK 3 0 (SUTURE) ×2
SUT SILK 3 0 SH CR/8 (SUTURE) ×2 IMPLANT
SUT SILK 3-0 18XBRD TIE 12 (SUTURE) ×1 IMPLANT
SUT VIC AB 3-0 SH 27 (SUTURE)
SUT VIC AB 3-0 SH 27X BRD (SUTURE) IMPLANT
TAPE CLOTH SURG 4X10 WHT LF (GAUZE/BANDAGES/DRESSINGS) ×1 IMPLANT
TOWEL OR 17X24 6PK STRL BLUE (TOWEL DISPOSABLE) ×2 IMPLANT
TOWEL OR 17X26 10 PK STRL BLUE (TOWEL DISPOSABLE) ×2 IMPLANT
TRAY FOLEY CATH 14FRSI W/METER (CATHETERS) ×2 IMPLANT
WATER STERILE IRR 1000ML POUR (IV SOLUTION) ×2 IMPLANT
YANKAUER SUCT BULB TIP NO VENT (SUCTIONS) ×1 IMPLANT

## 2012-04-02 NOTE — Progress Notes (Signed)
Subjective: Had a bm and passed small amount flatus with it, feels better today  Objective: Vital signs in last 24 hours: Temp:  [98.4 F (36.9 C)-99.5 F (37.5 C)] 99 F (37.2 C) (12/02 0600) Pulse Rate:  [93-115] 96  (12/02 0600) Resp:  [18-25] 18  (12/02 0600) BP: (139-152)/(82-99) 142/82 mmHg (12/02 0600) SpO2:  [97 %-99 %] 99 % (12/02 0600) Weight:  [198 lb (89.812 kg)] 198 lb (89.812 kg) (12/01 1700) Last BM Date: 04/01/12  Intake/Output from previous day: 12/01 0701 - 12/02 0700 In: 2475 [I.V.:2475] Out: 250 [Emesis/NG output:250] Intake/Output this shift:    General appearance: no distress GI: mild distension, not many bs, nontender, wound clean  Lab Results:   Basename 04/01/12 1036  WBC 12.7*  HGB 14.8  HCT 40.7  PLT 359   BMET  Basename 04/01/12 1036  NA 137  K 5.0  CL 96  CO2 24  GLUCOSE 119*  BUN 58*  CREATININE 5.54*  CALCIUM 11.2*    Studies/Results: Ct Abdomen Pelvis Wo Contrast  04/01/2012  *RADIOLOGY REPORT*  Clinical Data: Abdominal pain and cramping.  Small bowel obstruction.  Nausea.  Renal failure.  CT ABDOMEN AND PELVIS WITHOUT CONTRAST  Technique:  Multidetector CT imaging of the abdomen and pelvis was performed following the standard protocol without intravenous contrast.  Comparison: None.  Findings: Mild to moderately dilated small bowel loops containing air fluid levels are seen within the abdomen and pelvis.  There is a transition point to nondilated distal small bowel loops in the anterior lower abdomen just deep to anterior wall surgical mesh, and this is suspicious for adhesion. Recent postop change are seen in the intra-abdominal wall soft tissues however there is no evidence of recurrent hernia.  No evidence of abscess, free air, or free fluid.  The kidneys are markedly enlarged and show numerous cysts, as well as some punctate calcifications and high attenuation lesions likely representing proteinaceous cysts.  This is consistent  with autosomal dominant polycystic kidney disease.  There is no evidence of hydronephrosis.  There are also multiple small cysts seen throughout the hepatic parenchyma.  The gallbladder, pancreas, spleen, and adrenal glands are normal appearance on this noncontrast study.  No lymphadenopathy identified.  No focal inflammatory process noted.  Mildly enlarged prostate gland is seen.  IMPRESSION:  1.  Mid small bowel obstruction, with transition point in the anterior lower abdomen, just deep to the anterior wall surgical mesh, highly suspicious for adhesion. No evidence of recurrent abdominal wall hernia. 2.  Autosomal dominant polycystic kidney disease.   Original Report Authenticated By: Earle Gell, M.D.    Dg Abd Acute W/chest  04/01/2012  *RADIOLOGY REPORT*  Clinical Data: Abdominal pain with nausea and vomiting  ACUTE ABDOMEN SERIES (ABDOMEN 2 VIEW & CHEST 1 VIEW)  Comparison: Chest 12/05/2011  Findings: Lungs are clear without infiltrate or effusion.  Negative for heart failure.  Dilated small bowel with air-fluid levels compatible with small bowel obstruction.  Colon is decompressed.  No free air.  IMPRESSION: Small bowel obstruction.   Original Report Authenticated By: Carl Best, M.D.     Assessment/Plan: Postop sbo  I have examined patient and reviewed imaging.  It looks to me that mesh is in position and that there is not bowel over this.  He had a fair amount of adhesions from prior surgery in his rlq.  This appears to be related to those and possibly to mesh.  I think he likely will need to have a  reoperation as I am concerned.  Will check films today first.  I will see later today again.  I discussed with him likely recommendation for reoperation. [facility] 04/02/2012

## 2012-04-02 NOTE — Preoperative (Signed)
Beta Blockers   Reason not to administer Beta Blockers:Not Applicable 

## 2012-04-02 NOTE — Anesthesia Postprocedure Evaluation (Signed)
  Anesthesia Post-op Note  Patient: Michael [name]  Procedure(s) Performed: Procedure(s) (LRB) with comments: EXPLORATORY LAPAROTOMY (N/A) - Exploratory Laparotomy with resection of small intestine  Patient Location: PACU  Anesthesia Type:General  Level of Consciousness: awake  Airway and Oxygen Therapy: Patient Spontanous Breathing  Post-op Pain: mild  Post-op Assessment: Post-op Vital signs reviewed  Post-op Vital Signs: Reviewed  Complications: No apparent anesthesia complications

## 2012-04-02 NOTE — Anesthesia Preprocedure Evaluation (Addendum)
Anesthesia Evaluation  Patient identified by [name], date of birth, ID band Patient awake    Reviewed: Allergy & Precautions, H&P , NPO status , Patient's Chart, lab work & pertinent test results  Airway Mallampati: II TM Distance: >3 FB Neck ROM: Full    Dental  (+) Teeth Intact and Dental Advisory Given,    Pulmonary neg pulmonary ROS,    Pulmonary exam normal       Cardiovascular Exercise Tolerance: Good hypertension, Pt. on medications Rhythm:Regular Rate:Normal  05-Dec-2011 06:48:04 [facility] ROUTINE RECORD Normal sinus rhythm Normal ECG No old tracing to compare   Neuro/Psych negative neurological ROS  negative psych ROS   GI/Hepatic negative GI ROS, Neg liver ROS, hiatal hernia,   Endo/Other  negative endocrine ROS  Renal/GU ESRF, Renal hypertension and CRFRenal diseaseSept fistula placed.  Has not been accessed yet   Pt still produces urine    Musculoskeletal  (+) Arthritis -, Osteoarthritis,    Abdominal (+)  Abdomen: tender. Bowel sounds: decreased.  Peds  Hematology negative hematology ROS (+)   Anesthesia Other Findings   Reproductive/Obstetrics                         Anesthesia Physical Anesthesia Plan  ASA: III  Anesthesia Plan: General   Post-op Pain Management:    Induction: Intravenous, Rapid sequence and Cricoid pressure planned  Airway Management Planned: Oral ETT  Additional Equipment:   Intra-op Plan:   Post-operative Plan: Extubation in OR  Informed Consent: I have reviewed the patients History and Physical, chart, labs and discussed the procedure including the risks, benefits and alternatives for the proposed anesthesia with the patient or authorized representative who has indicated his/her understanding and acceptance.   Dental advisory given  Plan Discussed with: Anesthesiologist and CRNA  Anesthesia Plan Comments:          Anesthesia Quick Evaluation

## 2012-04-02 NOTE — Op Note (Signed)
Preoperative diagnosis: Small bowel obstruction after UH repair with mesh Postoperative diagnosis: adhesive small bowel obstruction Procedure: Exploratory laparotomy, small bowel resection Surgeon: Dr Serita Grammes  Anesthesia: GETA EBL: minimal Specimen: small bowel to pathology Drains: none Sponge and needle count correct times two at end of operation Complications: none  Indications: This is a 14 yof with renal disease planned on being listed for transplant who underwent an umbilical hernia repair with mesh recently.  About a week after this procedure which involved lysis of adhesions from a prior surgery he returned with a small bowel obstruction.  He underwent evaluation by me as well as imaging and was found to have a small bowel obstruction.  I recommended operation as I was concerned there may have been an issue with his mesh.    Procedure: After informed consent was obtained I took patient to operating room.  He was given cefoxitin.  He had sequential compression devices on his legs.  He was placed under general anesthesia without complication.  A Foley catheter was placed.  He then was prepped and draped in the standard sterile surgical fashion.  A surgical timeout was performed.  I made a midline incision around his prior incision.  I identified his mesh and this was not causing a problem.  The mesh was in good position but I removed this to figure out why he had an obstruction. There was some fluid present once I had removed the mesh. Again the mesh was not the source of the obstruction. I opened his abdomen up a little bit more. What he ended up having was where I took down some of the adhesions down in his right lower quadrant he had scar tissue that had become adherent to his small bowel and this had kinked on top of this piece of omentum leading to his small becoming adherent and twisted. There was a clear transition point at this region. This is where I had lysed prior adhesions and  this was just an early postoperative bowel obstruction from scar tissue. The bowel was all viable although it was very friable and I did not think that this was going to resolve with just lysing the adhesions. I elected to remove this segment of small intestine. I mobilized the entire intestine and ran from theligament of Treitz to the terminal ileum several times and this was all free. There were no other real adhesions in his pelvis after I took all of this down. I then divided the small intestine proximal and distal to the obstructed portion. I then used the LigaSure device to divide the mesentery and passed off as a specimen. I then approximated the small bowel with 3-0 silk. I placed a crotch stitch of 3-0 silk as well. I then made enterotomies in both and used the GIA stapler to create an anastomosis. I then viewed this anastomosis and it was hemostatic. I then close the common enterotomy with a TA stapler. I oversewed this with 3-0 silk sutures. The anastomosis was patent. I closed his mesenteric defect with 2-0 silk suture. I then placed this bowel back into his abdomen. I then closed his fascia with #1 looped PDS and use several interrupted Novafils at his prior umbilical hernia. I did not replace any mesh for this hernia. I then irrigated his incision. I stapled his umbilicus and his wound loosely together. I placed wicks of Telfa in the spaces. Sterile dressings were placed. He tolerated as well as extubated and transferred to recovery stable. [name]

## 2012-04-02 NOTE — Transfer of Care (Signed)
Immediate Anesthesia Transfer of Care Note  Patient: Michael [name]  Procedure(s) Performed: Procedure(s) (LRB) with comments: EXPLORATORY LAPAROTOMY (N/A) - Exploratory Laparotomy with resection of small intestine  Patient Location: PACU  Anesthesia Type:General  Level of Consciousness: awake  Airway & Oxygen Therapy: Patient Spontanous Breathing and Patient connected to face mask oxygen  Post-op Assessment: Report given to PACU RN and Post -op Vital signs reviewed and stable  Post vital signs: Reviewed and stable  Complications: No apparent anesthesia complications

## 2012-04-02 NOTE — Consult Note (Signed)
Michael [name] is an 56 y.o. male referred by Dr Donne Hazel   Chief Complaint: CKD 4, HTN Sec HPTH HPI: 56 yo BM with CKD4 sec PCKD presents with acute onset N/V last Thurs.  He had umbilical hernia repair on 03/20/12 and DC without problems.  Did well until last thurs.  Now has SBO with plans to take back to OR tonight.  Baseline SCr low 4's and now 5.3.  He says his UO has been good  Past Medical History  Diagnosis Date  . Hypertension   . Hyperparathyroidism, secondary renal   . H/O cardiac catheterization   . Hyperlipidemia   . Fatigue   . Chronic kidney disease     Polycystic kidney disease, not on diaylsis yet  . Kidney stones     pased one, [place]   . ESRD (end stage renal disease)     left av fistula - not on dialysis yet    Past Surgical History  Procedure Date  . Appendectomy   . Cardiac catheterization 04-05-2010    checking for blockage but none-[facility]  . Av fistula placement 12/05/2011    Procedure: ARTERIOVENOUS (AV) FISTULA CREATION;  Surgeon: Conrad [place], MD;  Location: [facility];  Service: Vascular;  Laterality: Left;  RADIO-CEPHALIC  fistula left arm  . Av fistula placement 01/11/2012    Procedure: ARTERIOVENOUS (AV) FISTULA CREATION;  Surgeon: Conrad Brodhead, MD;  Location: [facility];  Service: Vascular;  Laterality: Left;  Creation of left brachial cephalic arteriovenous fistula  . Umbilical hernia repair [id number]    Procedure: HERNIA REPAIR UMBILICAL ADULT;  Surgeon: [name], MD;  Location: [facility];  Service: General;  Laterality: N/A;  . Insertion of mesh 03/20/2012    Procedure: INSERTION OF MESH;  Surgeon: [name], MD;  Location: [facility];  Service: General;  Laterality: N/A;  . Hernia repair     Family History  Problem Relation Age of Onset  . Heart disease Mother   . Hyperlipidemia Mother   . Hypertension Mother   . Kidney disease Father   . Stroke Father   . Kidney disease Brother   Both father and brother had ESRD due to PCKD  Social  History:  reports that he has never smoked. He has never used smokeless tobacco. He reports that he does not drink alcohol or use illicit drugs.  Allergies:  Allergies  Allergen Reactions  . Lipitor (Atorvastatin) Itching    Leg pain  . Uloric (Febuxostat)     Leg pain    Medications Prior to Admission  Medication Sig Dispense Refill  . allopurinol (ZYLOPRIM) 100 MG tablet Take 200 mg by mouth daily.      [name] amLODipine (NORVASC) 5 MG tablet Take 10 mg by mouth 2 (two) times daily.       . colchicine 0.6 MG tablet Take 0.6 mg by mouth as needed. Gout flare      . furosemide (LASIX) 40 MG tablet Take 40 mg by mouth daily.      [name] HYDROcodone-acetaminophen (NORCO) 5-325 MG per tablet Take 1 tablet by mouth every 6 (six) hours as needed for pain.  30 tablet  0  . magnesium hydroxide (MILK OF MAGNESIA) 400 MG/5ML suspension Take 30 mLs by mouth daily as needed. For stomach      . metoprolol succinate (TOPROL-XL) 100 MG 24 hr tablet Take 100 mg by mouth daily. Take with or immediately following a meal.      . oxyCODONE-acetaminophen (ROXICET) 5-325  MG per tablet Take 1 tablet by mouth every 4 (four) hours as needed for pain.  30 tablet  0  . paricalcitol (ZEMPLAR) 1 MCG capsule Take 1 mcg by mouth daily.      [name] spironolactone (ALDACTONE) 25 MG tablet Take 25 mg by mouth daily.         Lab Results: UA: + prot otherwise benign  Basename 04/02/12 0737 04/01/12 1036  WBC 7.5 12.7*  HGB 12.1* 14.8  HCT 34.7* 40.7  PLT 255 359   BMET  Basename 04/02/12 0737 04/01/12 1036  NA 142 137  K 4.4 5.0  CL 107 96  CO2 22 24  GLUCOSE 91 119*  BUN 55* 58*  CREATININE 5.34* 5.54*  CALCIUM 9.0 11.2*  PHOS -- --   LFT  Basename 04/02/12 0737  PROT 6.9  ALBUMIN 3.7  AST 13  ALT 11  ALKPHOS 48  BILITOT 0.7  BILIDIR --  IBILI --   Ct Abdomen Pelvis Wo Contrast  04/01/2012  *RADIOLOGY REPORT*  Clinical Data: Abdominal pain and cramping.  Small bowel obstruction.  Nausea.  Renal failure.   CT ABDOMEN AND PELVIS WITHOUT CONTRAST  Technique:  Multidetector CT imaging of the abdomen and pelvis was performed following the standard protocol without intravenous contrast.  Comparison: None.  Findings: Mild to moderately dilated small bowel loops containing air fluid levels are seen within the abdomen and pelvis.  There is a transition point to nondilated distal small bowel loops in the anterior lower abdomen just deep to anterior wall surgical mesh, and this is suspicious for adhesion. Recent postop change are seen in the intra-abdominal wall soft tissues however there is no evidence of recurrent hernia.  No evidence of abscess, free air, or free fluid.  The kidneys are markedly enlarged and show numerous cysts, as well as some punctate calcifications and high attenuation lesions likely representing proteinaceous cysts.  This is consistent with autosomal dominant polycystic kidney disease.  There is no evidence of hydronephrosis.  There are also multiple small cysts seen throughout the hepatic parenchyma.  The gallbladder, pancreas, spleen, and adrenal glands are normal appearance on this noncontrast study.  No lymphadenopathy identified.  No focal inflammatory process noted.  Mildly enlarged prostate gland is seen.  IMPRESSION:  1.  Mid small bowel obstruction, with transition point in the anterior lower abdomen, just deep to the anterior wall surgical mesh, highly suspicious for adhesion. No evidence of recurrent abdominal wall hernia. 2.  Autosomal dominant polycystic kidney disease.   Original Report Authenticated By: Earle Gell, M.D.    Dg Abd 2 Views  04/02/2012  *RADIOLOGY REPORT*  Clinical Data: Small bowel obstruction, follow-up, abdominal pain  ABDOMEN - 2 VIEW  Comparison: CT abdomen pelvis of 04/01/2012  Findings: There are still dilated loops of small bowel with scattered air-fluid levels consistent with persistent partial small bowel obstruction.  An NG tube is present with the tip in the  region of the distal body of the stomach.  No free air is seen.  IMPRESSION: Persistent small bowel obstruction.  NG tube tip in region of distal body of stomach.   Original Report Authenticated By: Ivar Drape, M.D.    Dg Abd Acute W/chest  04/01/2012  *RADIOLOGY REPORT*  Clinical Data: Abdominal pain with nausea and vomiting  ACUTE ABDOMEN SERIES (ABDOMEN 2 VIEW & CHEST 1 VIEW)  Comparison: Chest 12/05/2011  Findings: Lungs are clear without infiltrate or effusion.  Negative for heart failure.  Dilated small bowel with  air-fluid levels compatible with small bowel obstruction.  Colon is decompressed.  No free air.  IMPRESSION: Small bowel obstruction.   Original Report Authenticated By: Carl Best, M.D.     ROS: Less n/v with NG tube in No sob No edema No BM since last thurs No new jt pain No dysuria No rash  PHYSICAL EXAM: Blood pressure 152/88, pulse 87, temperature 98.2 F (36.8 C), temperature source Oral, resp. rate 18, height 5\' 7"  (1.702 m), weight 89.812 kg (198 lb), SpO2 99.00%. HEENT: PERRLA EOMI NECK:no jvd, no nodes LUNGS:clear CARDIAC:RRR wo MRG [id number] BS, high pitched, + distention, mild diffuse tenderness EXT:no edema.  Lt UA AVF + bruit NEURO:CNI M&SI no asterixis ox3  Assessment: 1. SBO, to for surgery 2. CKD4 with slight increase in Scr rlated to hemodynamic changes from SBO 3. PCKD 4. Sec HPTH PLAN: 1.  Follow renal function 2. Cont with IV hydration.  Would decrease IV to 75cc/hr post op 3  IF need BP meds and pt to be NPO, could use catapress patch and/or IV hydralazine 10-20mg  IV q 4 hr prn SBP > 160   Deysha Cartier T 04/02/2012, 1:40 PM

## 2012-04-02 NOTE — Progress Notes (Signed)
Patient ID: Michael [name], male   DOB: 1956-03-17, 56 y.o.   [name]: [id number] This is a 56 year old male who underwent an umbilical hernia repair with mesh. At that time he had a significant amount of adhesions from his prior appendectomy. He returned on Sunday with a bowel obstruction. He has a CT scan as well as on followup films it still demonstrated bowel obstruction. This may be from adhesions from concerned that there could be an issue with his mesh placement or with the mesial lysis that needs to be addressed surgically. I discussed the options of continuing to monitor hemoglobin he operating room. I. recommended going to the operating room as I am concerned that there could be an issue that needs to be addressed surgically. He understands that we've elected to proceed later today. We discussed the risks including possible bowel resection or injury or long-term ileus. I also told him I might remove his mesh at the same time. We'll plan on doing this later today.

## 2012-04-03 ENCOUNTER — Encounter (HOSPITAL_COMMUNITY): Payer: Self-pay | Admitting: General Surgery

## 2012-04-03 LAB — BASIC METABOLIC PANEL
BUN: 61 mg/dL — ABNORMAL HIGH (ref 6–23)
CO2: 20 mEq/L (ref 19–32)
Calcium: 9.2 mg/dL (ref 8.4–10.5)
Chloride: 108 mEq/L (ref 96–112)
Creatinine, Ser: 6.09 mg/dL — ABNORMAL HIGH (ref 0.50–1.35)
GFR calc Af Amer: 11 mL/min — ABNORMAL LOW (ref >90)
GFR calc non Af Amer: 9 mL/min — ABNORMAL LOW (ref >90)
Glucose, Bld: 140 mg/dL — ABNORMAL HIGH (ref 70–99)
Potassium: 5.3 mEq/L — ABNORMAL HIGH (ref 3.5–5.1)
Sodium: 140 mEq/L (ref 135–145)

## 2012-04-03 LAB — RENAL FUNCTION PANEL
Albumin: 3.4 g/dL — ABNORMAL LOW (ref 3.5–5.2)
BUN: 58 mg/dL — ABNORMAL HIGH (ref 6–23)
CO2: 20 mEq/L (ref 19–32)
Calcium: 8.6 mg/dL (ref 8.4–10.5)
Chloride: 108 mEq/L (ref 96–112)
Creatinine, Ser: 5.85 mg/dL — ABNORMAL HIGH (ref 0.50–1.35)
GFR calc Af Amer: 11 mL/min — ABNORMAL LOW (ref >90)
GFR calc non Af Amer: 10 mL/min — ABNORMAL LOW (ref >90)
Glucose, Bld: 134 mg/dL — ABNORMAL HIGH (ref 70–99)
Phosphorus: 5 mg/dL — ABNORMAL HIGH (ref 2.3–4.6)
Potassium: 6.3 mEq/L (ref 3.5–5.1)
Sodium: 138 mEq/L (ref 135–145)

## 2012-04-03 LAB — CBC
HCT: 37.1 % — ABNORMAL LOW (ref 39.0–52.0)
Hemoglobin: 12.3 g/dL — ABNORMAL LOW (ref 13.0–17.0)
MCH: 28.2 pg (ref 26.0–34.0)
MCHC: 33.2 g/dL (ref 30.0–36.0)
MCV: 85.1 fL (ref 78.0–100.0)
Platelets: 318 10*3/uL (ref 150–400)
RBC: 4.36 MIL/uL (ref 4.22–5.81)
RDW: 13.5 % (ref 11.5–15.5)
WBC: 16.4 10*3/uL — ABNORMAL HIGH (ref 4.0–10.5)

## 2012-04-03 LAB — CREATININE, URINE, RANDOM: Creatinine, Urine: 318.56 mg/dL

## 2012-04-03 LAB — POTASSIUM: Potassium: 6.1 mEq/L — ABNORMAL HIGH (ref 3.5–5.1)

## 2012-04-03 LAB — SODIUM, URINE, RANDOM: Sodium, Ur: 12 mEq/L

## 2012-04-03 MED ORDER — FUROSEMIDE 10 MG/ML IJ SOLN
80.0000 mg | Freq: Two times a day (BID) | INTRAMUSCULAR | Status: DC
  Administered 2012-04-03 – 2012-04-05 (×5): 80 mg via INTRAVENOUS
  Filled 2012-04-03 (×7): qty 8

## 2012-04-03 MED ORDER — SODIUM POLYSTYRENE SULFONATE 15 GM/60ML PO SUSP
30.0000 g | Freq: Once | ORAL | Status: AC
  Administered 2012-04-03: 30 g
  Filled 2012-04-03: qty 120

## 2012-04-03 MED ORDER — SODIUM CHLORIDE 0.9 % IV BOLUS (SEPSIS)
500.0000 mL | Freq: Once | INTRAVENOUS | Status: AC
  Administered 2012-04-03: 500 mL via INTRAVENOUS

## 2012-04-03 NOTE — Progress Notes (Signed)
CRITICAL VALUE ALERT  Critical value received:  Potassium 6.3  Date of notification:  04/03/2012  Time of notification:  0719  Critical value read back:yes  Nurse who received alert:  Sharmon Revere RN  MD notified (1st page):    Time of first page:  0720  MD notified (2nd page):  Time of second page:  Responding MD:    Time MD responded:

## 2012-04-03 NOTE — Progress Notes (Signed)
S:pain controlled O:BP 132/94  Pulse 100  Temp 98.2 F (36.8 C) (Oral)  Resp 15  Ht 5\' 7"  (1.702 m)  Wt 89.812 kg (198 lb)  BMI 31.01 kg/m2  SpO2 96%  Intake/Output Summary (Last 24 hours) at 04/03/12 0950 Last data filed at 04/03/12 0753  Gross per 24 hour  Intake 3023.75 ml  Output   1070 ml  Net 1953.75 ml   Weight change:  [id number] and alert CVS:RRR Resp:clear [id number] BS, less distended  + tenderness Ext:no edema NEURO:CNI OX3 no asterixis      . antiseptic oral rinse  15 mL Mouth Rinse q12n4p  . cefOXitin  1 g Intravenous Q8H  . chlorhexidine  15 mL Mouth Rinse BID  . heparin  5,000 Units Subcutaneous Q8H  . [COMPLETED] labetalol      . metoprolol  5 mg Intravenous Q6H  . morphine   Intravenous Q4H  . pantoprazole (PROTONIX) IV  40 mg Intravenous QHS  . [COMPLETED] sodium chloride  500 mL Intravenous Once  . sodium polystyrene  30 g Per Tube Once  . [DISCONTINUED] ondansetron  4 mg Intravenous Once   Ct Abdomen Pelvis Wo Contrast  04/01/2012  *RADIOLOGY REPORT*  Clinical Data: Abdominal pain and cramping.  Small bowel obstruction.  Nausea.  Renal failure.  CT ABDOMEN AND PELVIS WITHOUT CONTRAST  Technique:  Multidetector CT imaging of the abdomen and pelvis was performed following the standard protocol without intravenous contrast.  Comparison: None.  Findings: Mild to moderately dilated small bowel loops containing air fluid levels are seen within the abdomen and pelvis.  There is a transition point to nondilated distal small bowel loops in the anterior lower abdomen just deep to anterior wall surgical mesh, and this is suspicious for adhesion. Recent postop change are seen in the intra-abdominal wall soft tissues however there is no evidence of recurrent hernia.  No evidence of abscess, free air, or free fluid.  The kidneys are markedly enlarged and show numerous cysts, as well as some punctate calcifications and high attenuation lesions likely representing  proteinaceous cysts.  This is consistent with autosomal dominant polycystic kidney disease.  There is no evidence of hydronephrosis.  There are also multiple small cysts seen throughout the hepatic parenchyma.  The gallbladder, pancreas, spleen, and adrenal glands are normal appearance on this noncontrast study.  No lymphadenopathy identified.  No focal inflammatory process noted.  Mildly enlarged prostate gland is seen.  IMPRESSION:  1.  Mid small bowel obstruction, with transition point in the anterior lower abdomen, just deep to the anterior wall surgical mesh, highly suspicious for adhesion. No evidence of recurrent abdominal wall hernia. 2.  Autosomal dominant polycystic kidney disease.   Original Report Authenticated By: Earle Gell, M.D.    Dg Abd 2 Views  04/02/2012  *RADIOLOGY REPORT*  Clinical Data: Small bowel obstruction, follow-up, abdominal pain  ABDOMEN - 2 VIEW  Comparison: CT abdomen pelvis of 04/01/2012  Findings: There are still dilated loops of small bowel with scattered air-fluid levels consistent with persistent partial small bowel obstruction.  An NG tube is present with the tip in the region of the distal body of the stomach.  No free air is seen.  IMPRESSION: Persistent small bowel obstruction.  NG tube tip in region of distal body of stomach.   Original Report Authenticated By: Ivar Drape, M.D.    Dg Abd Acute W/chest  04/01/2012  *RADIOLOGY REPORT*  Clinical Data: Abdominal pain with nausea and vomiting  ACUTE ABDOMEN SERIES (  ABDOMEN 2 VIEW & CHEST 1 VIEW)  Comparison: Chest 12/05/2011  Findings: Lungs are clear without infiltrate or effusion.  Negative for heart failure.  Dilated small bowel with air-fluid levels compatible with small bowel obstruction.  Colon is decompressed.  No free air.  IMPRESSION: Small bowel obstruction.   Original Report Authenticated By: Carl Best, M.D.    BMET    Component Value Date/Time   NA 138 04/03/2012 0545   K 6.3* 04/03/2012 0545   CL 108  04/03/2012 0545   CO2 20 04/03/2012 0545   GLUCOSE 134* 04/03/2012 0545   BUN 58* 04/03/2012 0545   CREATININE 5.85* 04/03/2012 0545   CALCIUM 8.6 04/03/2012 0545   GFRNONAA 10* 04/03/2012 0545   GFRAA 11* 04/03/2012 0545   CBC    Component Value Date/Time   WBC 16.4* 04/03/2012 0545   RBC 4.36 04/03/2012 0545   HGB 12.3* 04/03/2012 0545   HCT 37.1* 04/03/2012 0545   PLT 318 04/03/2012 0545   MCV 85.1 04/03/2012 0545   MCH 28.2 04/03/2012 0545   MCHC 33.2 04/03/2012 0545   RDW 13.5 04/03/2012 0545   LYMPHSABS 1.8 04/01/2012 1036   MONOABS 0.7 04/01/2012 1036   EOSABS 0.1 04/01/2012 1036   BASOSABS 0.0 04/01/2012 1036     Assessment: 1. Acute on CKD4 2. Hyperkalemia 3. SBO 4. Sec HPTH 5. PCKD  Plan: 1. Kayexalate per NG 2. IV lasix to help with K excretion 3. Recheck K at 4 pm 4. Renal in am   Michael [name] T

## 2012-04-03 NOTE — Progress Notes (Signed)
1 Day Post-Op  Subjective: Feels ok this am, no flatus  Objective: Vital signs in last 24 hours: Temp:  [97.2 F (36.2 C)-98.2 F (36.8 C)] 98.2 F (36.8 C) (12/03 0549) Pulse Rate:  [84-111] 100  (12/03 0549) Resp:  [10-20] 12  (12/03 0549) BP: (128-195)/(79-100) 132/94 mmHg (12/03 0549) SpO2:  [95 %-100 %] 96 % (12/03 0549) Last BM Date: 04/01/12  Intake/Output from previous day: 12/02 0701 - 12/03 0700 In: 3023.8 [I.V.:2743.8; NG/GT:30] Out: 1070 [Urine:900; Emesis/NG output:170] Intake/Output this shift:    General appearance: no distress Resp: diminished breath sounds bibasilar Cardio: regular rate and rhythm GI: approp tender, no bs, wound with expected drainage  Lab Results:   [facility] 04/03/12 0545 04/02/12 0737  WBC 16.4* 7.5  HGB 12.3* 12.1*  HCT 37.1* 34.7*  PLT 318 255   BMET  Basename 04/03/12 0545 04/02/12 0737  NA 138 142  K 6.3* 4.4  CL 108 107  CO2 20 22  GLUCOSE 134* 91  BUN 58* 55*  CREATININE 5.85* 5.34*  CALCIUM 8.6 9.0   PT/INR No results found for this basename: LABPROT:2,INR:2 in the last 72 hours ABG No results found for this basename: PHART:2,PCO2:2,PO2:2,HCO3:2 in the last 72 hours  Studies/Results: Ct Abdomen Pelvis Wo Contrast  04/01/2012  *RADIOLOGY REPORT*  Clinical Data: Abdominal pain and cramping.  Small bowel obstruction.  Nausea.  Renal failure.  CT ABDOMEN AND PELVIS WITHOUT CONTRAST  Technique:  Multidetector CT imaging of the abdomen and pelvis was performed following the standard protocol without intravenous contrast.  Comparison: None.  Findings: Mild to moderately dilated small bowel loops containing air fluid levels are seen within the abdomen and pelvis.  There is a transition point to nondilated distal small bowel loops in the anterior lower abdomen just deep to anterior wall surgical mesh, and this is suspicious for adhesion. Recent postop change are seen in the intra-abdominal wall soft tissues however there is no  evidence of recurrent hernia.  No evidence of abscess, free air, or free fluid.  The kidneys are markedly enlarged and show numerous cysts, as well as some punctate calcifications and high attenuation lesions likely representing proteinaceous cysts.  This is consistent with autosomal dominant polycystic kidney disease.  There is no evidence of hydronephrosis.  There are also multiple small cysts seen throughout the hepatic parenchyma.  The gallbladder, pancreas, spleen, and adrenal glands are normal appearance on this noncontrast study.  No lymphadenopathy identified.  No focal inflammatory process noted.  Mildly enlarged prostate gland is seen.  IMPRESSION:  1.  Mid small bowel obstruction, with transition point in the anterior lower abdomen, just deep to the anterior wall surgical mesh, highly suspicious for adhesion. No evidence of recurrent abdominal wall hernia. 2.  Autosomal dominant polycystic kidney disease.   Original Report Authenticated By: Earle Gell, M.D.    Dg Abd 2 Views  04/02/2012  *RADIOLOGY REPORT*  Clinical Data: Small bowel obstruction, follow-up, abdominal pain  ABDOMEN - 2 VIEW  Comparison: CT abdomen pelvis of 04/01/2012  Findings: There are still dilated loops of small bowel with scattered air-fluid levels consistent with persistent partial small bowel obstruction.  An NG tube is present with the tip in the region of the distal body of the stomach.  No free air is seen.  IMPRESSION: Persistent small bowel obstruction.  NG tube tip in region of distal body of stomach.   Original Report Authenticated By: Ivar Drape, M.D.    Dg Abd Acute W/chest  04/01/2012  *  RADIOLOGY REPORT*  Clinical Data: Abdominal pain with nausea and vomiting  ACUTE ABDOMEN SERIES (ABDOMEN 2 VIEW & CHEST 1 VIEW)  Comparison: Chest 12/05/2011  Findings: Lungs are clear without infiltrate or effusion.  Negative for heart failure.  Dilated small bowel with air-fluid levels compatible with small bowel obstruction.   Colon is decompressed.  No free air.  IMPRESSION: Small bowel obstruction.   Original Report Authenticated By: Carl Best, M.D.     Anti-infectives: Anti-infectives     Start     Dose/Rate Route Frequency Ordered Stop   04/02/12 2145   cefOXitin (MEFOXIN) 1 g in dextrose 5 % 50 mL IVPB        1 g 100 mL/hr over 30 Minutes Intravenous 3 times per day 04/02/12 2131 04/03/12 2159          Assessment/Plan: POD 1 sbr for sbo 1. pca 2. pulm toilet 3. Prn hydralazine 4. Await ileus resolve, cont ng tube 5. Appreciate nephrology following, k up on bmet, recheck now, will dc foley  6. Heparin, scds Kelsye Loomer 04/03/2012

## 2012-04-04 LAB — RENAL FUNCTION PANEL
Albumin: 3.1 g/dL — ABNORMAL LOW (ref 3.5–5.2)
BUN: 65 mg/dL — ABNORMAL HIGH (ref 6–23)
CO2: 19 mEq/L (ref 19–32)
Calcium: 8.9 mg/dL (ref 8.4–10.5)
Chloride: 108 mEq/L (ref 96–112)
Creatinine, Ser: 5.91 mg/dL — ABNORMAL HIGH (ref 0.50–1.35)
GFR calc Af Amer: 11 mL/min — ABNORMAL LOW (ref >90)
GFR calc non Af Amer: 10 mL/min — ABNORMAL LOW (ref >90)
Glucose, Bld: 100 mg/dL — ABNORMAL HIGH (ref 70–99)
Phosphorus: 4.2 mg/dL (ref 2.3–4.6)
Potassium: 4.5 mEq/L (ref 3.5–5.1)
Sodium: 141 mEq/L (ref 135–145)

## 2012-04-04 MED ORDER — SODIUM CHLORIDE 0.9 % IV SOLN
1000.0000 mL | INTRAVENOUS | Status: DC
  Administered 2012-04-05 – 2012-04-06 (×3): 1000 mL via INTRAVENOUS

## 2012-04-04 NOTE — Progress Notes (Signed)
S:pain controlled  Feels well.  No flatus O:BP 138/91  Pulse 103  Temp 98.6 F (37 C) (Oral)  Resp 18  Ht 5\' 7"  (1.702 m)  Wt 89.812 kg (198 lb)  BMI 31.01 kg/m2  SpO2 95%  Intake/Output Summary (Last 24 hours) at 04/04/12 0909 Last data filed at 04/04/12 0600  Gross per 24 hour  Intake 2749.08 ml  Output   3800 ml  Net -1050.92 ml   Weight change:  [id number] and alert CVS:RRR Resp:clear [id number] BS, less distended  + tenderness Ext:no edema NEURO:CNI OX3 no asterixis      . antiseptic oral rinse  15 mL Mouth Rinse q12n4p  . [COMPLETED] cefOXitin  1 g Intravenous Q8H  . chlorhexidine  15 mL Mouth Rinse BID  . furosemide  80 mg Intravenous BID  . heparin  5,000 Units Subcutaneous Q8H  . metoprolol  5 mg Intravenous Q6H  . morphine   Intravenous Q4H  . pantoprazole (PROTONIX) IV  40 mg Intravenous QHS  . [COMPLETED] sodium polystyrene  30 g Per Tube Once   No results found. BMET    Component Value Date/Time   NA 141 04/04/2012 0530   K 4.5 04/04/2012 0530   CL 108 04/04/2012 0530   CO2 19 04/04/2012 0530   GLUCOSE 100* 04/04/2012 0530   BUN 65* 04/04/2012 0530   CREATININE 5.91* 04/04/2012 0530   CALCIUM 8.9 04/04/2012 0530   GFRNONAA 10* 04/04/2012 0530   GFRAA 11* 04/04/2012 0530   CBC    Component Value Date/Time   WBC 16.4* 04/03/2012 0545   RBC 4.36 04/03/2012 0545   HGB 12.3* 04/03/2012 0545   HCT 37.1* 04/03/2012 0545   PLT 318 04/03/2012 0545   MCV 85.1 04/03/2012 0545   MCH 28.2 04/03/2012 0545   MCHC 33.2 04/03/2012 0545   RDW 13.5 04/03/2012 0545   LYMPHSABS 1.8 04/01/2012 1036   MONOABS 0.7 04/01/2012 1036   EOSABS 0.1 04/01/2012 1036   BASOSABS 0.0 04/01/2012 1036     Assessment: 1. Acute on CKD4, hopefully Scr has peaked 2. Hyperkalemia, improved 3. SBO 4. Sec HPTH 5. PCKD  Plan: 1. Cont lasix and iv fluids 2.  No indication for HD 3. Recheck labs in am   Kelli Egolf T

## 2012-04-04 NOTE — Progress Notes (Signed)
Pt c/o left lower quadrant pain, sharp, 9/10, encouraged to use PCA, described it as gas pain, abd distended but soft,hyperactive bowel sounds,NGT draining,denies nausea,encouraged to ambulate but refuses,had him sit on a gas chair with some relief,md on call notified,no new order.

## 2012-04-04 NOTE — Progress Notes (Signed)
UR completed 

## 2012-04-04 NOTE — Progress Notes (Signed)
2 Days Post-Op  Subjective: No flatus, feels "stomach rumbling", ambulated times two  Objective: Vital signs in last 24 hours: Temp:  [97.7 F (36.5 C)-98.8 F (37.1 C)] 98.6 F (37 C) (12/04 0536) Pulse Rate:  [95-108] 103  (12/04 0536) Resp:  [15-21] 18  (12/04 0536) BP: (132-165)/(78-101) 138/91 mmHg (12/04 0536) SpO2:  [92 %-97 %] 95 % (12/04 0536) Last BM Date: 04/01/12  Intake/Output from previous day: 12/03 0701 - 12/04 0700 In: 2749.1 [I.V.:2691.1; IV Piggyback:58] Out: 3800 [Urine:2650; Emesis/NG output:1150] Intake/Output this shift:    General appearance: no distress Resp: clear to auscultation bilaterally Cardio: regular rate and rhythm GI: wound clean with wicks in place draining, some bs present, approp tender  Lab Results:   [facility] 04/03/12 0545 04/02/12 0737  WBC 16.4* 7.5  HGB 12.3* 12.1*  HCT 37.1* 34.7*  PLT 318 255   BMET  Basename 04/04/12 0530 04/03/12 1505  NA 141 140  K 4.5 5.3*  CL 108 108  CO2 19 20  GLUCOSE 100* 140*  BUN 65* 61*  CREATININE 5.91* 6.09*  CALCIUM 8.9 9.2    Assessment/Plan: POD 2 elap with sbr 1. Neuro- continue pca 2. CV- continue iv lopressor until tol po, hydralazine as needed 3. Pulm- oob, pulm toilet 4. GI- ileus starting to resolve with bs, will leave ng and npo today and await flatus 5. Renal- appreciate Dr. Mercy Moore assistance, k much better today, making urine, will remove foley today 6. Heparin, scds  Arkeem Harts 04/04/2012

## 2012-04-05 LAB — CBC
HCT: 33.8 % — ABNORMAL LOW (ref 39.0–52.0)
Hemoglobin: 11.4 g/dL — ABNORMAL LOW (ref 13.0–17.0)
MCH: 28.2 pg (ref 26.0–34.0)
MCHC: 33.7 g/dL (ref 30.0–36.0)
MCV: 83.7 fL (ref 78.0–100.0)
Platelets: 266 10*3/uL (ref 150–400)
RBC: 4.04 MIL/uL — ABNORMAL LOW (ref 4.22–5.81)
RDW: 13.6 % (ref 11.5–15.5)
WBC: 7.5 10*3/uL (ref 4.0–10.5)

## 2012-04-05 LAB — RENAL FUNCTION PANEL
Albumin: 3 g/dL — ABNORMAL LOW (ref 3.5–5.2)
BUN: 63 mg/dL — ABNORMAL HIGH (ref 6–23)
CO2: 19 mEq/L (ref 19–32)
Calcium: 9 mg/dL (ref 8.4–10.5)
Chloride: 108 mEq/L (ref 96–112)
Creatinine, Ser: 5.24 mg/dL — ABNORMAL HIGH (ref 0.50–1.35)
GFR calc Af Amer: 13 mL/min — ABNORMAL LOW (ref >90)
GFR calc non Af Amer: 11 mL/min — ABNORMAL LOW (ref >90)
Glucose, Bld: 96 mg/dL (ref 70–99)
Phosphorus: 3.7 mg/dL (ref 2.3–4.6)
Potassium: 3.8 mEq/L (ref 3.5–5.1)
Sodium: 143 mEq/L (ref 135–145)

## 2012-04-05 MED ORDER — SIMETHICONE 40 MG/0.6ML PO SUSP
40.0000 mg | Freq: Two times a day (BID) | ORAL | Status: DC | PRN
  Administered 2012-04-05 – 2012-04-06 (×3): 40 mg via ORAL
  Filled 2012-04-05 (×3): qty 0.6

## 2012-04-05 MED ORDER — GLYCERIN (LAXATIVE) 2.1 G RE SUPP
1.0000 | Freq: Once | RECTAL | Status: AC
  Administered 2012-04-05: 1 via RECTAL
  Filled 2012-04-05: qty 1

## 2012-04-05 MED ORDER — MORPHINE SULFATE 2 MG/ML IJ SOLN: 2.0000 mg | INTRAMUSCULAR | Status: DC | PRN

## 2012-04-05 NOTE — Progress Notes (Signed)
3 Days Post-Op  Subjective: No n/v, no flatus yet, has "gas pains", frustrated that he is not really improving quickly  Objective: Vital signs in last 24 hours: Temp:  [97.6 F (36.4 C)-98.6 F (37 C)] 98.6 F (37 C) (12/05 0535) Pulse Rate:  [94-110] 110  (12/05 0535) Resp:  [15-21] 15  (12/05 0754) BP: (143-155)/(70-96) 143/95 mmHg (12/05 0535) SpO2:  [94 %-99 %] 96 % (12/05 0754) FiO2 (%):  [2 %] 2 % (12/05 0754) Last BM Date: 04/01/12  Intake/Output from previous day: 12/04 0701 - 12/05 0700 In: 1717 [I.V.:1717] Out: 4075 [Urine:3675; Emesis/NG output:400] Intake/Output this shift: Total I/O In: -  Out: 300 [Urine:300]  General appearance: no distress Resp: diminished breath sounds bibasilar Cardio: regular rate and rhythm GI: moderately distended, approp tender, wound draining without infection, bs present  Lab Results:   Basename 04/05/12 0547 04/03/12 0545  WBC 7.5 16.4*  HGB 11.4* 12.3*  HCT 33.8* 37.1*  PLT 266 318   BMET  Basename 04/05/12 0547 04/04/12 0530  NA 143 141  K 3.8 4.5  CL 108 108  CO2 19 19  GLUCOSE 96 100*  BUN 63* 65*  CREATININE 5.24* 5.91*  CALCIUM 9.0 8.9    Assessment/Plan: POD 3 elap, sbr 1. Neuro- continue pca until ileus resolves 2. CV- continue lopressor, prn hydralazine 3. Pulm- pulm toilet 4. Renal- good uop with foley out, cr stable, k normal, appreciate renal following 5. GI- I think he is slowly resolving ileus, he very much wants to try some simethicone and I don't think this will hurt, will give him suppository as well but I think this is just going to take time 6. scds sq heparin  [facility] 04/05/2012

## 2012-04-05 NOTE — Progress Notes (Signed)
S: Still No flatus O:BP 143/95  Pulse 110  Temp 98.6 F (37 C) (Oral)  Resp 15  Ht 5\' 7"  (1.702 m)  Wt 89.812 kg (198 lb)  BMI 31.01 kg/m2  SpO2 96%  Intake/Output Summary (Last 24 hours) at 04/05/12 1000 Last data filed at 04/05/12 0933  Gross per 24 hour  Intake   1717 ml  Output   4200 ml  Net  -2483 ml   Weight change:  [id number] and alert CVS:RRR Resp:clear Abd:+ BS, + distention  + tenderness Ext:no edema NEURO:CNI OX3 no asterixis      . antiseptic oral rinse  15 mL Mouth Rinse q12n4p  . chlorhexidine  15 mL Mouth Rinse BID  . furosemide  80 mg Intravenous BID  . [COMPLETED] Glycerin (Adult)  1 suppository Rectal Once  . heparin  5,000 Units Subcutaneous Q8H  . metoprolol  5 mg Intravenous Q6H  . morphine   Intravenous Q4H  . pantoprazole (PROTONIX) IV  40 mg Intravenous QHS   No results found. BMET    Component Value Date/Time   NA 143 04/05/2012 0547   K 3.8 04/05/2012 0547   CL 108 04/05/2012 0547   CO2 19 04/05/2012 0547   GLUCOSE 96 04/05/2012 0547   BUN 63* 04/05/2012 0547   CREATININE 5.24* 04/05/2012 0547   CALCIUM 9.0 04/05/2012 0547   GFRNONAA 11* 04/05/2012 0547   GFRAA 13* 04/05/2012 0547   CBC    Component Value Date/Time   WBC 7.5 04/05/2012 0547   RBC 4.04* 04/05/2012 0547   HGB 11.4* 04/05/2012 0547   HCT 33.8* 04/05/2012 0547   PLT 266 04/05/2012 0547   MCV 83.7 04/05/2012 0547   MCH 28.2 04/05/2012 0547   MCHC 33.7 04/05/2012 0547   RDW 13.6 04/05/2012 0547   LYMPHSABS 1.8 04/01/2012 1036   MONOABS 0.7 04/01/2012 1036   EOSABS 0.1 04/01/2012 1036   BASOSABS 0.0 04/01/2012 1036     Assessment: 1. Acute on CKD4,SCr trending down 2. Hyperkalemia, improved 3. SBO 4. Sec HPTH 5. PCKD  Plan: 1. DC lasix for now, resume as needed 2. Recheck labs in am   Kashay Cavenaugh T

## 2012-04-05 NOTE — Progress Notes (Signed)
Pt had large amt liquid brown stool.Jacklynn Ganong 04/05/2012

## 2012-04-06 ENCOUNTER — Encounter (INDEPENDENT_AMBULATORY_CARE_PROVIDER_SITE_OTHER): Payer: BC Managed Care – PPO | Admitting: General Surgery

## 2012-04-06 LAB — RENAL FUNCTION PANEL
Albumin: 3.3 g/dL — ABNORMAL LOW (ref 3.5–5.2)
BUN: 63 mg/dL — ABNORMAL HIGH (ref 6–23)
CO2: 23 mEq/L (ref 19–32)
Calcium: 9.5 mg/dL (ref 8.4–10.5)
Chloride: 108 mEq/L (ref 96–112)
Creatinine, Ser: 4.65 mg/dL — ABNORMAL HIGH (ref 0.50–1.35)
GFR calc Af Amer: 15 mL/min — ABNORMAL LOW (ref >90)
GFR calc non Af Amer: 13 mL/min — ABNORMAL LOW (ref >90)
Glucose, Bld: 138 mg/dL — ABNORMAL HIGH (ref 70–99)
Phosphorus: 3.8 mg/dL (ref 2.3–4.6)
Potassium: 3.8 mEq/L (ref 3.5–5.1)
Sodium: 145 mEq/L (ref 135–145)

## 2012-04-06 MED ORDER — AMLODIPINE BESYLATE 10 MG PO TABS
10.0000 mg | ORAL_TABLET | Freq: Two times a day (BID) | ORAL | Status: DC
  Administered 2012-04-06 – 2012-04-09 (×7): 10 mg via ORAL
  Filled 2012-04-06 (×9): qty 1

## 2012-04-06 MED ORDER — OXYCODONE-ACETAMINOPHEN 5-325 MG PO TABS: 1.0000 | ORAL_TABLET | ORAL | Status: DC | PRN

## 2012-04-06 MED ORDER — ONDANSETRON HCL 4 MG/2ML IJ SOLN
4.0000 mg | Freq: Four times a day (QID) | INTRAMUSCULAR | Status: DC | PRN
Start: 2012-04-06 — End: 2012-04-09
  Administered 2012-04-06 (×2): 4 mg via INTRAVENOUS
  Filled 2012-04-06: qty 2

## 2012-04-06 MED ORDER — SODIUM CHLORIDE 0.9 % IV SOLN
1000.0000 mL | INTRAVENOUS | Status: DC
  Administered 2012-04-06 – 2012-04-08 (×3): 1000 mL via INTRAVENOUS

## 2012-04-06 MED ORDER — METOPROLOL SUCCINATE ER 100 MG PO TB24
100.0000 mg | ORAL_TABLET | Freq: Every day | ORAL | Status: DC
  Administered 2012-04-06 – 2012-04-09 (×4): 100 mg via ORAL
  Filled 2012-04-06 (×4): qty 1

## 2012-04-06 NOTE — Progress Notes (Signed)
S: Had BM  No new CO O:BP 136/90  Pulse 107  Temp 97.7 F (36.5 C) (Oral)  Resp 18  Ht 5\' 7"  (1.702 m)  Wt 89.812 kg (198 lb)  BMI 31.01 kg/m2  SpO2 98%  Intake/Output Summary (Last 24 hours) at 04/06/12 0953 Last data filed at 04/06/12 0941  Gross per 24 hour  Intake 2521.67 ml  Output   2676 ml  Net -154.33 ml   Weight change:  [id number] and alert CVS:RRR Resp:clear Abd:+ BS, + distention  + tenderness Ext:no edema NEURO:CNI OX3 no asterixis      . amLODipine  10 mg Oral BID  . heparin  5,000 Units Subcutaneous Q8H  . metoprolol succinate  100 mg Oral Daily  . pantoprazole (PROTONIX) IV  40 mg Intravenous QHS  . [DISCONTINUED] antiseptic oral rinse  15 mL Mouth Rinse q12n4p  . [DISCONTINUED] chlorhexidine  15 mL Mouth Rinse BID  . [DISCONTINUED] furosemide  80 mg Intravenous BID  . [DISCONTINUED] metoprolol  5 mg Intravenous Q6H  . [DISCONTINUED] morphine   Intravenous Q4H   No results found. BMET    Component Value Date/Time   NA 145 04/06/2012 0610   K 3.8 04/06/2012 0610   CL 108 04/06/2012 0610   CO2 23 04/06/2012 0610   GLUCOSE 138* 04/06/2012 0610   BUN 63* 04/06/2012 0610   CREATININE 4.65* 04/06/2012 0610   CALCIUM 9.5 04/06/2012 0610   GFRNONAA 13* 04/06/2012 0610   GFRAA 15* 04/06/2012 0610   CBC    Component Value Date/Time   WBC 7.5 04/05/2012 0547   RBC 4.04* 04/05/2012 0547   HGB 11.4* 04/05/2012 0547   HCT 33.8* 04/05/2012 0547   PLT 266 04/05/2012 0547   MCV 83.7 04/05/2012 0547   MCH 28.2 04/05/2012 0547   MCHC 33.7 04/05/2012 0547   RDW 13.6 04/05/2012 0547   LYMPHSABS 1.8 04/01/2012 1036   MONOABS 0.7 04/01/2012 1036   EOSABS 0.1 04/01/2012 1036   BASOSABS 0.0 04/01/2012 1036     Assessment: 1. Acute on CKD4,SCr at baseline with good UO 2. Hyperkalemia, improved 3. SBO 4. Sec HPTH 5. PCKD  Plan: 1.I am adding little at this point.  Renal fx at baseline.  Will sign off.  Call if further renal issues. Can resume his home meds except for  aldactone Markham Dumlao T

## 2012-04-06 NOTE — Progress Notes (Signed)
4 Days Post-Op  Subjective: Had a bm, states he is passing a lot of gas, no n/v this am, ambulating  Objective: Vital signs in last 24 hours: Temp:  [97.7 F (36.5 C)-99.1 F (37.3 C)] 97.7 F (36.5 C) (12/06 0537) Pulse Rate:  [85-109] 107  (12/06 0537) Resp:  [18-23] 18  (12/06 0537) BP: (130-151)/(76-97) 136/90 mmHg (12/06 0537) SpO2:  [95 %-99 %] 98 % (12/06 0537) Last BM Date: 04/05/12  Intake/Output from previous day: 12/05 0701 - 12/06 0700 In: 2521.7 [P.O.:100; I.V.:2411.7; IV Piggyback:10] Out: [id number] [Urine:3300; Emesis/NG output:125; Stool:1] Intake/Output this shift:    General appearance: no distress Resp: clear to auscultation bilaterally Cardio: tacy rr GI: bs present, wound with some bloody drainage wicks in place, approp tender  Lab Results:   [facility] 04/05/12 0547  WBC 7.5  HGB 11.4*  HCT 33.8*  PLT 266   BMET  Basename 04/06/12 0610 04/05/12 0547  NA 145 143  K 3.8 3.8  CL 108 108  CO2 23 19  GLUCOSE 138* 96  BUN 63* 63*  CREATININE 4.65* 5.24*  CALCIUM 9.5 9.0    Assessment/Plan: POD 4 elap/sbr 1. Neuro- change to po pain meds 2. cv- restart po meds, lopressor and norvasc 3. pulm toilet 4. Cr good, uop adequate 5. Will give clears today hopefully advance maybe home Sunday 6. Remove wicks in wound tomorrow 7. subq heparin, scds  Mayer Vondrak 04/06/2012

## 2012-04-06 NOTE — Progress Notes (Signed)
0328 Patient c/o nausea notified Dr.Thompson order received for zofran. Went in to give zofran patient had vomited in the floor mod amt of green liquid. Patient stated that the Dr. [name] his NGT patient does not want it connected to suction will continue to monitor.

## 2012-04-06 NOTE — Progress Notes (Deleted)
No change since initial assessment this am

## 2012-04-07 NOTE — Progress Notes (Signed)
5 Days Post-Op   Assessment: s/p Procedure(s): EXPLORATORY LAPAROTOMY Patient Active Problem List  Diagnosis  . Chronic kidney disease (CKD), stage IV (severe)  . End stage renal disease  . Umbilical hernia without mention of obstruction or gangrene    1. Status post small bowel resection with gradual return of bowel function 2chronic renal insufficiency  Plan: who lives asked it today she vomited last night it remains a bit distended. We'll recheck labs in the morning.  Subjective: Feels okay this morning. Tolerating liquids and no nausea now but vomited apparently a fair amount of the middle the night. He feels less distended today. He's had several small stools. He is not having any cramps.  Objective: Vital signs in last 24 hours: Temp:  [97.5 F (36.4 C)-98.4 F (36.9 C)] 97.9 F (36.6 C) (12/07 0553) Pulse Rate:  [96-98] 96  (12/07 0553) Resp:  [12-18] 12  (12/07 0553) BP: (135-147)/(79-89) 136/89 mmHg (12/07 0553) SpO2:  [95 %-97 %] 96 % (12/07 0553)   Intake/Output from previous day: 12/06 0701 - 12/07 0700 In: 1333.3 [P.O.:360; I.V.:973.3] Out: 1375 [Urine:375; Emesis/NG output:1000] Intake/Output this shift:     General appearance: alert, cooperative and no distress Resp: clear to auscultation bilaterally Cardio: regular rate and rhythm, S1, S2 normal, no murmur, click, rub or gallop GI: abdomen is distended but soft. Not particularly tender. Very rare bowel sounds.  Incision: incision is clean wicks to be removed  Lab Results:   Basename 04/05/12 0547  WBC 7.5  HGB 11.4*  HCT 33.8*  PLT 266   BMET  Basename 04/06/12 0610 04/05/12 0547  NA 145 143  K 3.8 3.8  CL 108 108  CO2 23 19  GLUCOSE 138* 96  BUN 63* 63*  CREATININE 4.65* 5.24*  CALCIUM 9.5 9.0   PT/INR No results found for this basename: LABPROT:2,INR:2 in the last 72 hours ABG No results found for this basename: PHART:2,PCO2:2,PO2:2,HCO3:2 in the last 72 hours  MEDS,  Scheduled    . amLODipine  10 mg Oral BID  . heparin  5,000 Units Subcutaneous Q8H  . metoprolol succinate  100 mg Oral Daily  . pantoprazole (PROTONIX) IV  40 mg Intravenous QHS    Studies/Results: No results found.    LOS: 6 days     Haywood Lasso, MD, [facility], [place]   04/07/2012 11:15 AM

## 2012-04-08 LAB — CBC
HCT: 30 % — ABNORMAL LOW (ref 39.0–52.0)
Hemoglobin: 10 g/dL — ABNORMAL LOW (ref 13.0–17.0)
MCH: 27.3 pg (ref 26.0–34.0)
MCHC: 33.3 g/dL (ref 30.0–36.0)
MCV: 82 fL (ref 78.0–100.0)
Platelets: 294 10*3/uL (ref 150–400)
RBC: 3.66 MIL/uL — ABNORMAL LOW (ref 4.22–5.81)
RDW: 13.1 % (ref 11.5–15.5)
WBC: 5.1 10*3/uL (ref 4.0–10.5)

## 2012-04-08 LAB — BASIC METABOLIC PANEL
BUN: 49 mg/dL — ABNORMAL HIGH (ref 6–23)
CO2: 17 mEq/L — ABNORMAL LOW (ref 19–32)
Calcium: 8.5 mg/dL (ref 8.4–10.5)
Chloride: 108 mEq/L (ref 96–112)
Creatinine, Ser: 3.91 mg/dL — ABNORMAL HIGH (ref 0.50–1.35)
GFR calc Af Amer: 19 mL/min — ABNORMAL LOW (ref >90)
GFR calc non Af Amer: 16 mL/min — ABNORMAL LOW (ref >90)
Glucose, Bld: 83 mg/dL (ref 70–99)
Potassium: 3.2 mEq/L — ABNORMAL LOW (ref 3.5–5.1)
Sodium: 138 mEq/L (ref 135–145)

## 2012-04-08 MED ORDER — POTASSIUM CHLORIDE CRYS ER 20 MEQ PO TBCR
20.0000 meq | EXTENDED_RELEASE_TABLET | Freq: Three times a day (TID) | ORAL | Status: DC
  Administered 2012-04-08 – 2012-04-09 (×4): 20 meq via ORAL
  Filled 2012-04-08 (×6): qty 1

## 2012-04-08 MED ORDER — PANTOPRAZOLE SODIUM 40 MG PO TBEC
40.0000 mg | DELAYED_RELEASE_TABLET | Freq: Every day | ORAL | Status: DC
  Administered 2012-04-08: 40 mg via ORAL
  Filled 2012-04-08 (×2): qty 1

## 2012-04-08 MED ORDER — KCL-LACTATED RINGERS 20 MEQ/L IV SOLN
INTRAVENOUS | Status: DC
  Administered 2012-04-08 – 2012-04-09 (×2): via INTRAVENOUS
  Filled 2012-04-08 (×2): qty 1000

## 2012-04-08 NOTE — Progress Notes (Signed)
Pt tolerated soft diet well, no nausea/vomiting.

## 2012-04-08 NOTE — Progress Notes (Signed)
Pt/wife taught how to change mid abd dressing and reviewed s/s of infection and what to call MD for, they both feel comfortable about going home/changing the dressing.  Supplies given to pt to get started at home.

## 2012-04-08 NOTE — Progress Notes (Signed)
6 Days Post-Op   Assessment: s/p Procedure(s): EXPLORATORY LAPAROTOMY Patient Active Problem List  Diagnosis  . Chronic kidney disease (CKD), stage IV (severe)  . End stage renal disease  . Umbilical hernia without mention of obstruction or gangrene    Returning bowel function, resolving and improving Mild hypokalemia  Plan: Advance diet Plan for discharge tomorrow give supplemental potassium today recheck BMET tomorrow  Subjective: Feels much better today. Has had several bowel movements. No further nausea or vomiting. Pain is well-controlled and minimal. Has been ambulating. Wants to advance diet. Hopefully go home soon  Objective: Vital signs in last 24 hours: Temp:  [98.4 F (36.9 C)-98.9 F (37.2 C)] 98.9 F (37.2 C) (12/08 0518) Pulse Rate:  [65-81] 80  (12/08 0518) Resp:  [14-18] 18  (12/08 0518) BP: (124-140)/(70-91) 128/75 mmHg (12/08 0518) SpO2:  [96 %-98 %] 97 % (12/08 0518)   Intake/Output from previous day: 12/07 0701 - 12/08 0700 In: -  Out: 200 [Urine:200] Intake/Output this shift:     General appearance: alert and no distress Resp: clear to auscultation bilaterally GI: still slightly distended but clearly softer than yesterday. Not tender. Bowel sounds present.  Incision: healing well, wicks were removed yesterday by the nurse  Lab Results:   [facility] 04/08/12 0724  WBC 5.1  HGB 10.0*  HCT 30.0*  PLT 294   BMET  Basename 04/08/12 0724 04/06/12 0610  NA 138 145  K 3.2* 3.8  CL 108 108  CO2 17* 23  GLUCOSE 83 138*  BUN 49* 63*  CREATININE 3.91* 4.65*  CALCIUM 8.5 9.5   PT/INR No results found for this basename: LABPROT:2,INR:2 in the last 72 hours ABG No results found for this basename: PHART:2,PCO2:2,PO2:2,HCO3:2 in the last 72 hours  MEDS, Scheduled    . amLODipine  10 mg Oral BID  . heparin  5,000 Units Subcutaneous Q8H  . metoprolol succinate  100 mg Oral Daily  . pantoprazole (PROTONIX) IV  40 mg Intravenous QHS     Studies/Results: No results found.    LOS: 7 days     Haywood Lasso, MD, [facility], [place]   04/08/2012 9:32 AM

## 2012-04-09 LAB — BASIC METABOLIC PANEL
BUN: 43 mg/dL — ABNORMAL HIGH (ref 6–23)
CO2: 18 mEq/L — ABNORMAL LOW (ref 19–32)
Calcium: 8.5 mg/dL (ref 8.4–10.5)
Chloride: 110 mEq/L (ref 96–112)
Creatinine, Ser: 3.63 mg/dL — ABNORMAL HIGH (ref 0.50–1.35)
GFR calc Af Amer: 20 mL/min — ABNORMAL LOW (ref >90)
GFR calc non Af Amer: 18 mL/min — ABNORMAL LOW (ref >90)
Glucose, Bld: 86 mg/dL (ref 70–99)
Potassium: 4 mEq/L (ref 3.5–5.1)
Sodium: 137 mEq/L (ref 135–145)

## 2012-04-09 MED ORDER — OXYCODONE-ACETAMINOPHEN 5-325 MG PO TABS: 1.0000 | ORAL_TABLET | ORAL | Status: DC | PRN

## 2012-04-09 NOTE — Progress Notes (Signed)
7 Days Post-Op  Subjective: Having bms, passing flatus, no n/v tolerating diet  Objective: Vital signs in last 24 hours: Temp:  [97.4 F (36.3 C)-98.1 F (36.7 C)] 97.4 F (36.3 C) (12/09 0553) Pulse Rate:  [79-85] 85  (12/09 0553) Resp:  [16-20] 16  (12/09 0553) BP: (120-143)/(74-94) 132/83 mmHg (12/09 0553) SpO2:  [99 %-100 %] 100 % (12/09 0553) Last BM Date: 04/08/12  Intake/Output from previous day:   Intake/Output this shift:    General appearance: no distress GI: wound with mild serous drainage to be expected, soft, approp tender, bs present  Lab Results:   [facility] 04/08/12 0724  WBC 5.1  HGB 10.0*  HCT 30.0*  PLT 294   BMET  Basename 04/09/12 0540 04/08/12 0724  NA 137 138  K 4.0 3.2*  CL 110 108  CO2 18* 17*  GLUCOSE 86 83  BUN 43* 49*  CREATININE 3.63* 3.91*  CALCIUM 8.5 8.5    Assessment/Plan: POD 6 sbr  Bowel function returned, no further issues, will dc home today  [facility] 04/09/2012

## 2012-04-09 NOTE — Progress Notes (Signed)
Patient discharged to home with instructions. 

## 2012-04-12 NOTE — Discharge Summary (Signed)
Physician Discharge Summary  Patient ID: Michael [name] [name]: [id number] DOB/AGE: Feb 03, 1956 56 y.o.  Admit date: 04/01/2012 Discharge date: 04/12/2012  Admission Diagnoses: Small bowel obstruction s/p umbilical hernia repair CRI HTN  Discharge Diagnoses:  Resolved ileus saa  Discharged Condition: good  Hospital Course: 46 yom who was referred for repair of UH prior to being listed for renal transplant. I did fairly uncomplicated umbilical hernia repair with mesh that did require some lysis of adhesions from his prior abdominal surgery in right lower quadrant.  He did well for over a week but then returned with a small bowel obstruction.  He was observed for 24 hours but I was concerned this would be mesh related as it is uncommon after umbilical hernia repair.  I did exploratory laparotomy where mesh was in good position and this was not source of problem.  He had an early adhesive bowel obstruction with a portion of his small bowel kinked.  I do not think this would have resolved conservatively either.  I did remove mesh.  I ended up needing to do a small bowel resection.  Postoperatively his ileus resolved after several days and he was tolerating po, wound was clean, pain was controlled.  He was then discharged home.  His renal function worsened immediately postop and he had hyperkalemia.  I did consult nephrology and all this was improved upon discharge.   Consults: nephrology  Significant Diagnostic Studies: radiology: CT scan:   Treatments: surgery: laparotomy, removal of mesh, primary repair hernia, small bowel resection    Disposition: 01-Home or Self Care     Medication List     As of 04/12/2012  7:17 AM    STOP taking these medications         HYDROcodone-acetaminophen 5-325 MG per tablet   Commonly known as: NORCO/VICODIN      TAKE these medications         allopurinol 100 MG tablet   Commonly known as: ZYLOPRIM   Take 200 mg by mouth daily.      amLODipine 5  MG tablet   Commonly known as: NORVASC   Take 10 mg by mouth 2 (two) times daily.      colchicine 0.6 MG tablet   Take 0.6 mg by mouth as needed. Gout flare      furosemide 40 MG tablet   Commonly known as: LASIX   Take 40 mg by mouth daily.      magnesium hydroxide 400 MG/5ML suspension   Commonly known as: MILK OF MAGNESIA   Take 30 mLs by mouth daily as needed. For stomach      metoprolol succinate 100 MG 24 hr tablet   Commonly known as: TOPROL-XL   Take 100 mg by mouth daily. Take with or immediately following a meal.      oxyCODONE-acetaminophen 5-325 MG per tablet   Commonly known as: PERCOCET/ROXICET   Take 1 tablet by mouth every 4 (four) hours as needed for pain.      oxyCODONE-acetaminophen 5-325 MG per tablet   Commonly known as: PERCOCET/ROXICET   Take 1 tablet by mouth every 4 (four) hours as needed.      paricalcitol 1 MCG capsule   Commonly known as: ZEMPLAR   Take 1 mcg by mouth daily.      spironolactone 25 MG tablet   Commonly known as: ALDACTONE   Take 25 mg by mouth daily.           Follow-up Information  Follow up with [facility], [place]. In 1 week.   Contact information:   [address] [place] [address] [phone]          Signed: [name] 04/12/2012, 7:17 AM

## 2012-04-18 ENCOUNTER — Telehealth (INDEPENDENT_AMBULATORY_CARE_PROVIDER_SITE_OTHER): Payer: Self-pay | Admitting: General Surgery

## 2012-04-18 NOTE — Telephone Encounter (Signed)
Spoke with pt and informed him that I have set him up to have a first PO on 12/23 at 12:25

## 2012-04-18 NOTE — Telephone Encounter (Signed)
Message copied by Juanita Laster on Wed Apr 18, 2012  2:35 PM ------      Message from: Jackquline Denmark      Created: Wed Apr 18, 2012  1:20 PM      Contact: [phone]       Patient told by Dr. Romilda Garret at time of discharge someone would call him back with a po appt, he has not received a call.  Discharge last week on Monday, Dr. Lowella Dell 1st available appt is 05/14/12, please call to schedule.

## 2012-04-23 ENCOUNTER — Encounter (INDEPENDENT_AMBULATORY_CARE_PROVIDER_SITE_OTHER): Payer: Self-pay | Admitting: General Surgery

## 2012-04-23 ENCOUNTER — Ambulatory Visit (INDEPENDENT_AMBULATORY_CARE_PROVIDER_SITE_OTHER): Payer: BC Managed Care – PPO | Admitting: General Surgery

## 2012-04-23 VITALS — BP 136/82 | HR 70 | Temp 97.6°F | Resp 16 | Ht 67.0 in | Wt 187.0 lb

## 2012-04-23 DIAGNOSIS — Z09 Encounter for follow-up examination after completed treatment for conditions other than malignant neoplasm: Secondary | ICD-10-CM

## 2012-04-23 NOTE — Progress Notes (Signed)
Subjective:     Patient ID: Michael [name], male   DOB: 1955-05-19, 56 y.o.   [name]: [id number]  HPI This is a 56 year old male with chronic renal insufficiency who had an umbilical hernia. I did a mesh repair of this in an open fashion. He had a lot of scar tissue from a prior right lower quadrant surgery. I did take down a lot of adhesions. About 10 days postoperatively he was readmitted with a bowel obstruction. I was concerned this was related to the placement of the mesh and ended up taking him back to the operating room. This was not related to the mesh but was related to adhesiolysis I had done. I ended up doing a small bowel resection and reanastomosed it. I removed his mesh and fix this hernia primarily at that point. He is now healed essentially by secondary intention. He is having bowel movements and is getting back to his normal appetite and diet now. He reports no real complaints except for lack of energy.  Review of Systems     Objective:   Physical Exam Healing wound without infection, open a little at umbilicus still with some serous drainage, I removed staples today    Assessment:     S/p elap for sbo after uh repair    Plan:     He is doing well I think his energy should get better over some time. I removed his staples today. I did place some silver nitrate and a couple of areas of granulation tissue as well. I will plan on seeing him back in one month.

## 2012-05-18 ENCOUNTER — Encounter (INDEPENDENT_AMBULATORY_CARE_PROVIDER_SITE_OTHER): Payer: Self-pay | Admitting: General Surgery

## 2012-05-18 ENCOUNTER — Ambulatory Visit (INDEPENDENT_AMBULATORY_CARE_PROVIDER_SITE_OTHER): Payer: BC Managed Care – PPO | Admitting: General Surgery

## 2012-05-18 VITALS — BP 150/82 | HR 88 | Temp 97.8°F | Resp 16 | Ht 67.0 in | Wt 190.6 lb

## 2012-05-18 DIAGNOSIS — Z09 Encounter for follow-up examination after completed treatment for conditions other than malignant neoplasm: Secondary | ICD-10-CM

## 2012-05-18 NOTE — Progress Notes (Signed)
Subjective:     Patient ID: Michael [name], male   DOB: [birth year], 57 y.o.   [name]: [id number]  HPI This is a 57 year old male with chronic renal insufficiency who had an umbilical hernia. I did a mesh repair of this in an open fashion. He had a lot of scar tissue from a prior right lower quadrant surgery. I did take down a lot of adhesions. About 10 days postoperatively he was readmitted with a bowel obstruction. I was concerned this was related to the placement of the mesh and ended up taking him back to the operating room. This was not related to the mesh but was related to adhesiolysis I had done. I ended up doing a small bowel resection and reanastomosed it. I removed his mesh and fix this hernia primarily at that point. He still has some drainage from his wound but is otherwise well.   Review of Systems     Objective:   Physical Exam Incision is healed except for 2 small spots with granulation tissue that I have silver nitrated before    Assessment:     Open abdominal wound    Plan:     I placed silver nitrate and these again today. These are nearly healed. I have him come back in 2 weeks to do this one more time and that hopefully will have these areas healed. The lowermost portion is almost a polypoid-looking area I told him if that persists we can just excise it in the office.

## 2012-05-24 DIAGNOSIS — I493 Ventricular premature depolarization: Secondary | ICD-10-CM | POA: Insufficient documentation

## 2012-06-01 ENCOUNTER — Encounter (INDEPENDENT_AMBULATORY_CARE_PROVIDER_SITE_OTHER): Payer: Self-pay | Admitting: General Surgery

## 2012-06-01 ENCOUNTER — Ambulatory Visit (INDEPENDENT_AMBULATORY_CARE_PROVIDER_SITE_OTHER): Payer: BC Managed Care – PPO | Admitting: General Surgery

## 2012-06-01 VITALS — BP 130/84 | HR 78 | Resp 16 | Ht 67.0 in | Wt 189.0 lb

## 2012-06-01 DIAGNOSIS — Z09 Encounter for follow-up examination after completed treatment for conditions other than malignant neoplasm: Secondary | ICD-10-CM

## 2012-06-01 NOTE — Progress Notes (Signed)
Subjective:     Patient ID: Michael [name], male   DOB: 03-27-1956, 57 y.o.   [name]: [id number]  HPI This is a 57 year old male with chronic renal insufficiency who had an umbilical hernia. I did a mesh repair of this in an open fashion. He had a lot of scar tissue from a prior right lower quadrant surgery. I did take down a lot of adhesions. About 10 days postoperatively he was readmitted with a bowel obstruction. I was concerned this was related to the placement of the mesh and ended up taking him back to the operating room. This was not related to the mesh but was related to adhesiolysis I had done. I ended up doing a small bowel resection and reanastomosed it. I removed his mesh and fix this hernia primarily at that point. His wound is now healed and he has no complaints.     Review of Systems     Objective:   Physical Exam Healed wound with no infection    Assessment:     S/p elap for obstruction, primary hernia repair    Plan:     He is released to full activity, wound has now healed. He can see me as needed

## 2012-08-01 ENCOUNTER — Encounter (INDEPENDENT_AMBULATORY_CARE_PROVIDER_SITE_OTHER): Payer: Self-pay

## 2012-09-03 ENCOUNTER — Encounter (INDEPENDENT_AMBULATORY_CARE_PROVIDER_SITE_OTHER): Payer: Self-pay | Admitting: General Surgery

## 2012-09-03 ENCOUNTER — Ambulatory Visit (INDEPENDENT_AMBULATORY_CARE_PROVIDER_SITE_OTHER): Payer: BC Managed Care – PPO | Admitting: General Surgery

## 2012-09-03 VITALS — BP 132/84 | HR 76 | Resp 18 | Ht 66.0 in | Wt 193.0 lb

## 2012-09-03 DIAGNOSIS — K432 Incisional hernia without obstruction or gangrene: Secondary | ICD-10-CM

## 2012-09-03 NOTE — Progress Notes (Signed)
Subjective:     Patient ID: Michael [name], male   DOB: Oct 23, 1955, 57 y.o.   [name]: [id number]  HPI This is a 57 year old male with chronic renal insufficiency who had an umbilical hernia. I did a mesh repair of this in an open fashion. He had a lot of scar tissue from a prior right lower quadrant surgery. I did take down a lot of adhesions. About 10 days postoperatively he was readmitted with a bowel obstruction. I was concerned this was related to the placement of the mesh and ended up taking him back to the operating room. This was not related to the mesh but was related to adhesiolysis I had done with early postop bowel obstruction. I ended up doing a small bowel resection and reanastomosed it. I removed his mesh and fixed this hernia primarily at that point. He has been doing really well.  He is now dialyzed via lue fistula three times weekly.  He has noted a mass above umbilicus lately that always goes down and does not really bother him except it is there.  He is on transplant list now.  Review of Systems     Objective:   Physical Exam Healed midline incision, at superior aspect of incision is reducible nontender mass that is consistent with hernia    Assessment:     Incisional hernia     Plan:     I think this is likely an incisional hernia. It's hard to examine the remainder of his abdomen. This is due to the scar tissue that is present.I will send him to get ct prior to discussion of how to proceed and may need to discuss with his transplant surgeon as well.

## 2012-09-04 ENCOUNTER — Ambulatory Visit
Admission: RE | Admit: 2012-09-04 | Discharge: 2012-09-04 | Disposition: A | Discharge status: Home / Self Care | Payer: BC Managed Care – PPO | Source: Ambulatory Visit | Attending: General Surgery | Admitting: General Surgery

## 2012-09-04 IMAGING — CT CT ABD-PELV W/O CM
2 of 4 series · 17 of 46 positions shown, 19 images · non-contrast
Comparison: Scan dated [DATE]

CLINICAL DATA: Recurrent umbilical hernia.

CT ABDOMEN AND PELVIS WITHOUT CONTRAST
TECHNIQUE: Multidetector CT imaging of the abdomen and pelvis was
performed following the standard protocol without intravenous
contrast.

[Series 2: abd/pelvis without · axial · non-contrast · 0.78mm/px · z∈[-340,+40]mm · 14 of 84 slices shown, 16 images]
[im 4/84  soft-tissue]
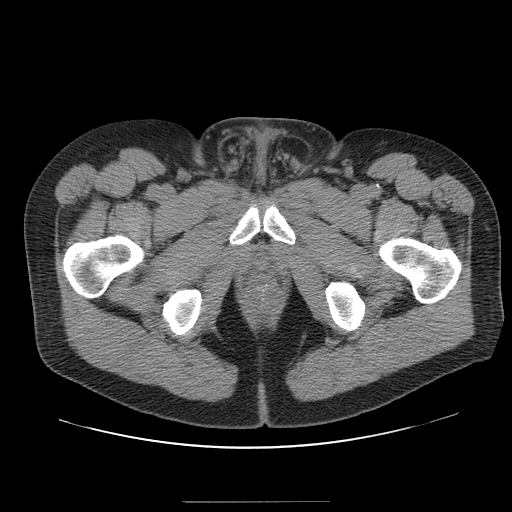
[im 4/84  bone]
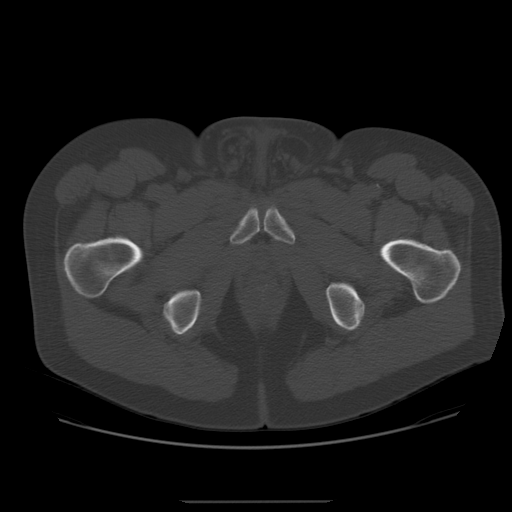
[im 11/84  soft-tissue]
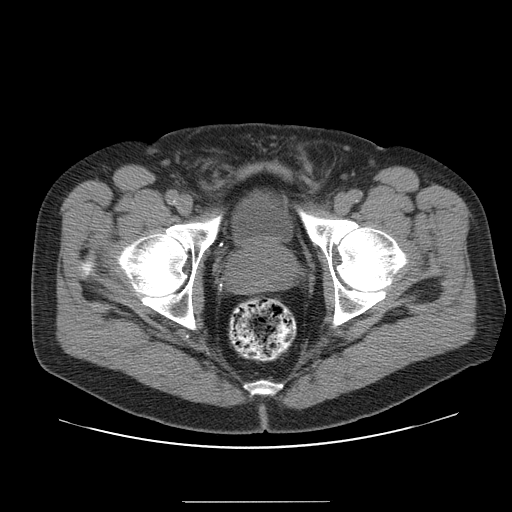
[im 15/84  soft-tissue]
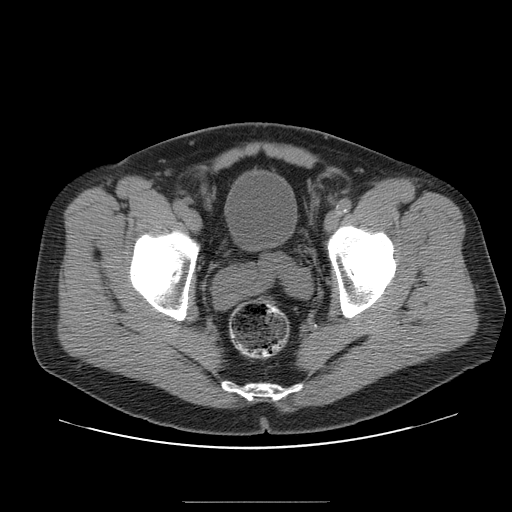
[im 22/84  soft-tissue]
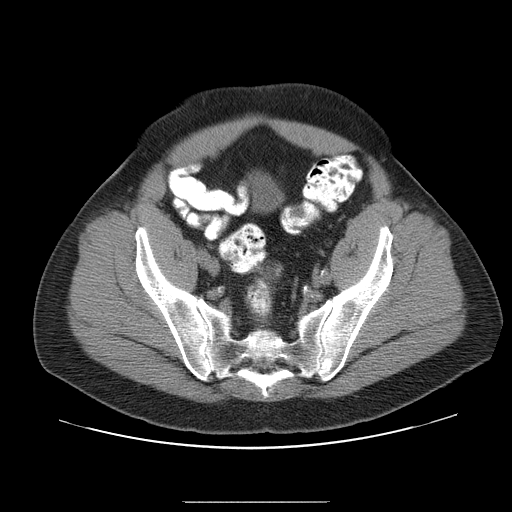
[im 29/84  soft-tissue]
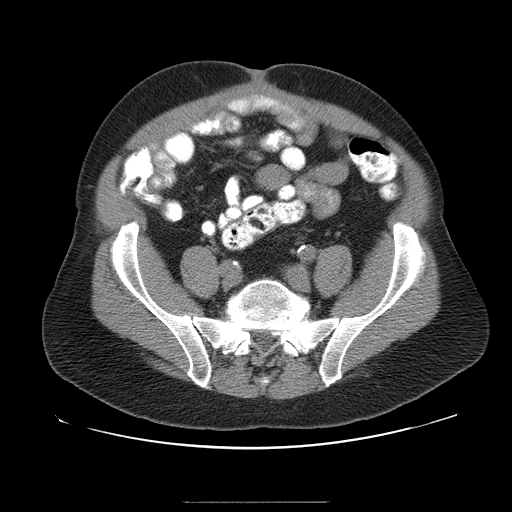
[im 33/84  soft-tissue]
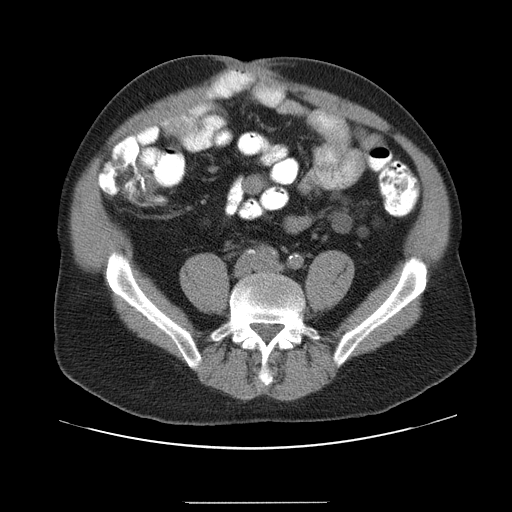
[im 40/84  soft-tissue]
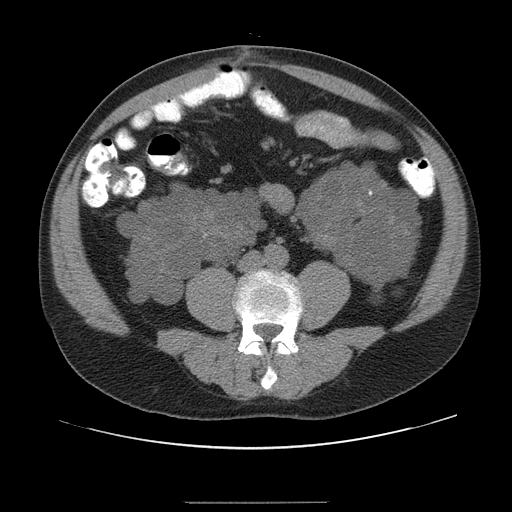
[im 44/84  soft-tissue]
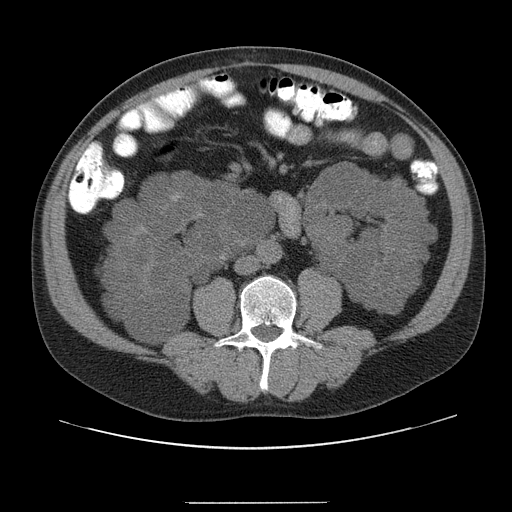
[im 51/84  soft-tissue]
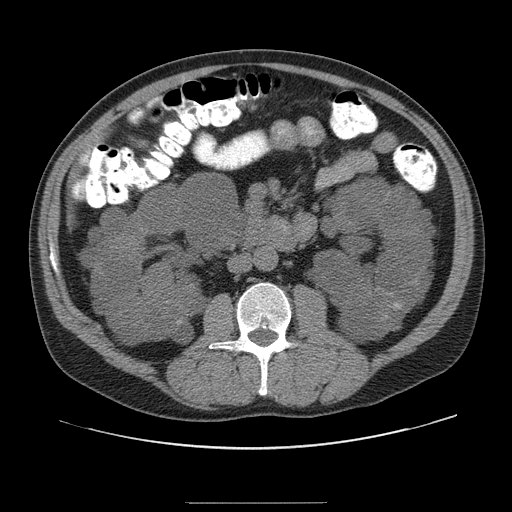
[im 51/84  bone]
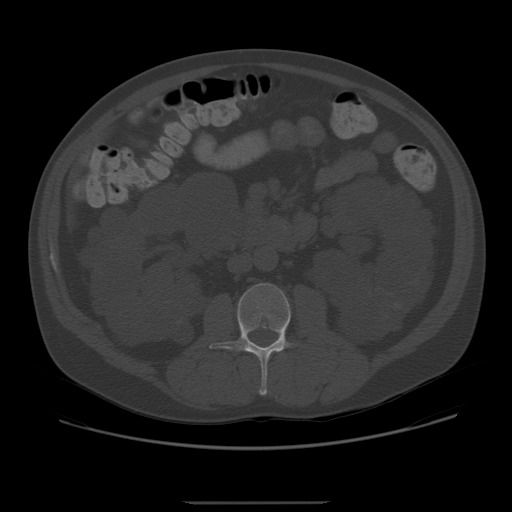
[im 55/84  soft-tissue]
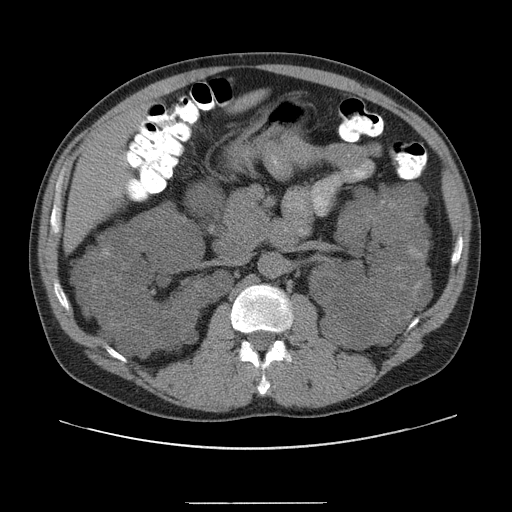
[im 62/84  soft-tissue]
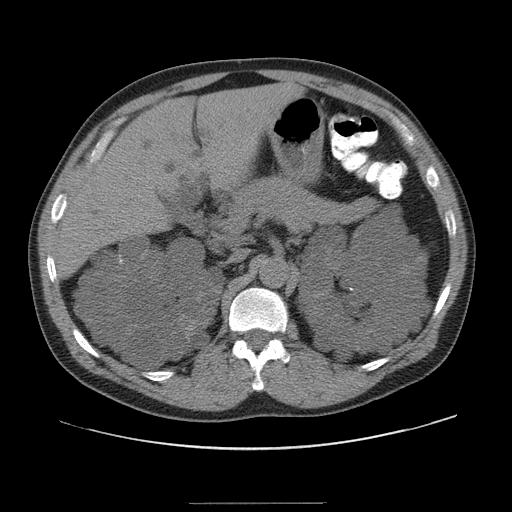
[im 69/84  soft-tissue]
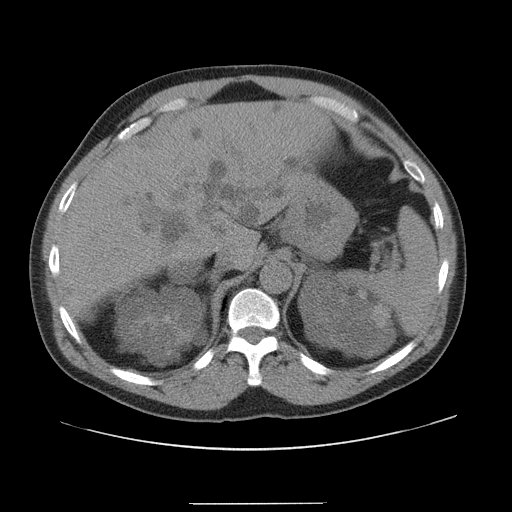
[im 73/84  soft-tissue]
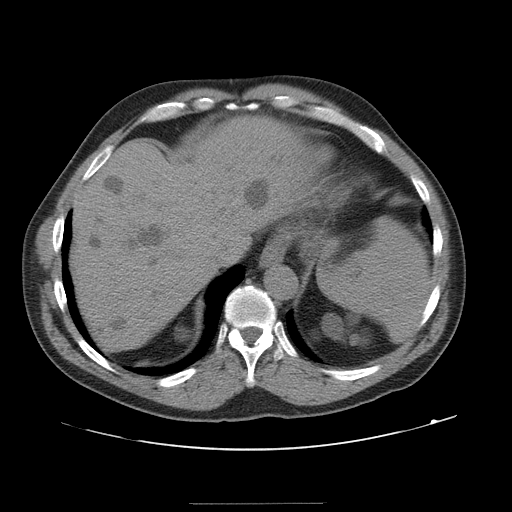
[im 80/84  soft-tissue]
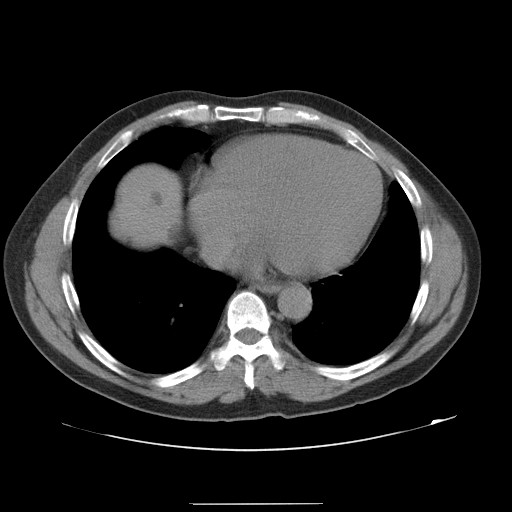

[Series 400: cor · coronal · 0.91mm/px · 3 of 154 slices shown]
[im 52/154  soft-tissue]
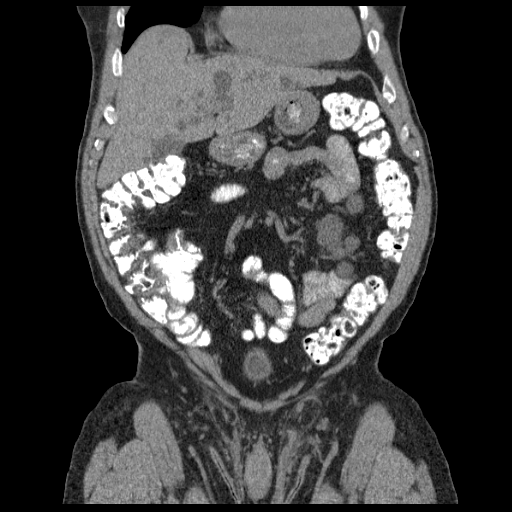
[im 69/154  soft-tissue]
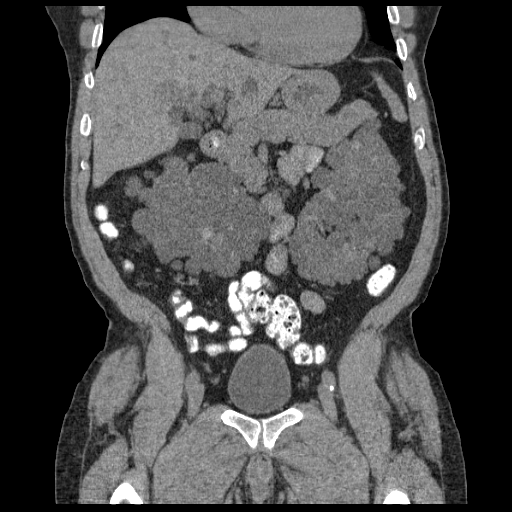
[im 86/154  soft-tissue]
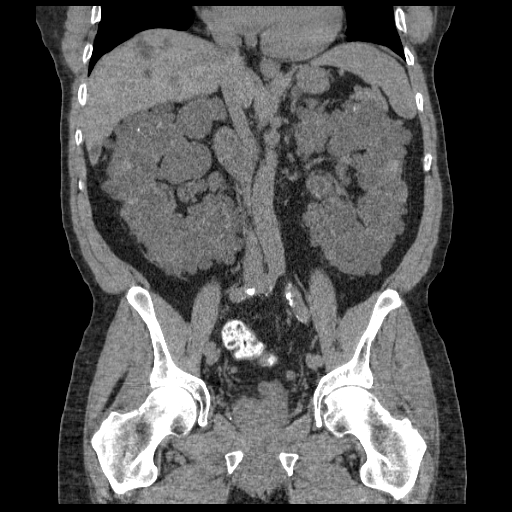

[17 of 46 positions shown; findings below may reference images not displayed]

FINDINGS: There is scarring at the site of the repair of the
umbilical hernia.  There is a tiny defect in the anterior abdominal
wall just above the umbilicus with protrusion of a small portion of
one small bowel loop through the defect.  This is best seen on
image 77 of series 401 and on image number 46 of series 2. There
are several small bowel loops which I suspect are adherent to the
anterior abdominal wall at the site of the prior incision.

Again noted is polycystic liver and kidney disease, unchanged since
the prior study.  Pancreas and spleen and adrenal glands are
normal.  There is no bowel dilatation.  No adenopathy.  No acute
osseous abnormality.  Broad-based small disc protrusion at L4-5.
IMPRESSION: Tiny incisional hernia just above the umbilicus and just to the
right of midline.

## 2012-09-07 ENCOUNTER — Telehealth (INDEPENDENT_AMBULATORY_CARE_PROVIDER_SITE_OTHER): Payer: Self-pay

## 2012-09-07 NOTE — Telephone Encounter (Signed)
Called pt to notify him that his CT scan does show a small hernia per Dr Donne Hazel. The pt needs to come back and see Dr Donne Hazel again in the office to discuss the results. I made the pt an appt for 10/01/12. The pt understands.

## 2012-10-01 ENCOUNTER — Ambulatory Visit (INDEPENDENT_AMBULATORY_CARE_PROVIDER_SITE_OTHER): Payer: BC Managed Care – PPO | Admitting: General Surgery

## 2012-10-01 ENCOUNTER — Encounter (INDEPENDENT_AMBULATORY_CARE_PROVIDER_SITE_OTHER): Payer: Self-pay | Admitting: General Surgery

## 2012-10-01 VITALS — BP 126/80 | HR 70 | Temp 98.3°F | Resp 12 | Ht 66.0 in | Wt 197.8 lb

## 2012-10-01 DIAGNOSIS — K432 Incisional hernia without obstruction or gangrene: Secondary | ICD-10-CM

## 2012-10-01 NOTE — Progress Notes (Signed)
Patient ID: Michael [name], male   DOB: 11/22/1955, 57 y.o.   [name]: [id number]  Chief Complaint  Patient presents with  . Follow-up    HPI Michael [name] is a 56 y.o. male.   HPI This is a 58 year old male with chronic renal insufficiency who had an umbilical hernia. I did a mesh repair of this in an open fashion. He had a lot of scar tissue from a prior right lower quadrant surgery. I did take down a lot of adhesions. About 10 days postoperatively he was readmitted with a bowel obstruction. I was concerned this was related to the placement of the mesh and ended up taking him back to the operating room. This was not related to the mesh but was related to adhesiolysis I had done with early postop bowel obstruction. I ended up doing a small bowel resection and reanastomosed it. I removed his mesh and fixed this hernia primarily at that point. He has been doing really well. He is now dialyzed via lue fistula three times weekly. He has noted a mass above umbilicus lately that always goes down and does not really bother him except it is there. He is on transplant list now. I sent him to get a ct scan which confirms a hernia at superior aspect of my incision.  He does not have any complaints today and comes back to discuss plan.  Past Medical History  Diagnosis Date  . Hypertension   . Hyperparathyroidism, secondary renal   . H/O cardiac catheterization   . Hyperlipidemia   . Fatigue   . Chronic kidney disease     Polycystic kidney disease, not on diaylsis yet  . Kidney stones     pased one, One Lazer   . ESRD (end stage renal disease)     left av fistula - not on dialysis yet    Past Surgical History  Procedure Laterality Date  . Appendectomy    . Cardiac catheterization  04-05-2010    checking for blockage but none-[facility]  . Av fistula placement  12/05/2011    Procedure: ARTERIOVENOUS (AV) FISTULA CREATION;  Surgeon: Conrad [place]ville, MD;  Location: [place];  Service: Vascular;  Laterality: Left;   RADIO-CEPHALIC  fistula left arm  . Av fistula placement  01/11/2012    Procedure: ARTERIOVENOUS (AV) FISTULA CREATION;  Surgeon: Conrad Arkoma, MD;  Location: [place];  Service: Vascular;  Laterality: Left;  Creation of left brachial cephalic arteriovenous fistula  . Umbilical hernia repair  03/20/2012    Procedure: HERNIA REPAIR UMBILICAL ADULT;  Surgeon: [name], MD;  Location: [place];  Service: General;  Laterality: N/A;  . Insertion of mesh  03/20/2012    Procedure: INSERTION OF MESH;  Surgeon: [name], MD;  Location: [place];  Service: General;  Laterality: N/A;  . Hernia repair    . Laparotomy  04/02/2012    Procedure: EXPLORATORY LAPAROTOMY;  Surgeon: [name], MD;  Location: [facility] OR;  Service: General;  Laterality: N/A;  Exploratory Laparotomy with resection of small intestine    Family History  Problem Relation Age of Onset  . Heart disease Mother   . Hyperlipidemia Mother   . Hypertension Mother   . Kidney disease Father   . Stroke Father   . Kidney disease Brother     Social History History  Substance Use Topics  . Smoking status: Never Smoker   . Smokeless tobacco: Never Used  . Alcohol Use: No    Allergies  Allergen  Reactions  . Lipitor (Atorvastatin) Itching    Leg pain  . Uloric (Febuxostat)     Leg pain    Current Outpatient Prescriptions  Medication Sig Dispense Refill  . allopurinol (ZYLOPRIM) 100 MG tablet Take 200 mg by mouth daily.      [name] amLODipine (NORVASC) 5 MG tablet Take 10 mg by mouth 2 (two) times daily.       . colchicine 0.6 MG tablet Take 0.6 mg by mouth as needed. Gout flare      . furosemide (LASIX) 40 MG tablet Take 40 mg by mouth daily.      . magnesium hydroxide (MILK OF MAGNESIA) 400 MG/5ML suspension Take 30 mLs by mouth daily as needed. For stomach      . metoprolol succinate (TOPROL-XL) 100 MG 24 hr tablet Take 100 mg by mouth daily. Take with or immediately following a meal.      . paricalcitol (ZEMPLAR) 1 MCG  capsule Take 1 mcg by mouth daily.      [name] spironolactone (ALDACTONE) 25 MG tablet Take 25 mg by mouth daily.       No current facility-administered medications for this visit.    Review of Systems Review of Systems  Constitutional: Negative for fever, chills and unexpected weight change.  HENT: Negative for hearing loss, congestion, sore throat, trouble swallowing and voice change.   Eyes: Negative for visual disturbance.  Respiratory: Negative for cough and wheezing.   Cardiovascular: Negative for chest pain, palpitations and leg swelling.  Gastrointestinal: Negative for nausea, vomiting, abdominal pain, diarrhea, constipation, blood in stool, abdominal distention, anal bleeding and rectal pain.  Genitourinary: Negative for hematuria and difficulty urinating.  Musculoskeletal: Negative for arthralgias.  Skin: Negative for rash and wound.  Neurological: Negative for seizures, syncope, weakness and headaches.  Hematological: Negative for adenopathy. Does not bruise/bleed easily.  Psychiatric/Behavioral: Negative for confusion.    Blood pressure 126/80, pulse 70, temperature 98.3 F (36.8 C), temperature source Temporal, resp. rate 12, height 5\' 6"  (1.676 m), weight 197 lb 12.8 oz (89.721 kg).  Physical Exam Physical Exam  Vitals reviewed. Constitutional: He appears well-developed and well-nourished.  Abdominal: Soft. Normal appearance and bowel sounds are normal. He exhibits no distension. There is no tenderness. A hernia (incisional hernia nontender easily reducible) is present.      Data Reviewed CT ABDOMEN AND PELVIS WITHOUT CONTRAST  Technique: Multidetector CT imaging of the abdomen and pelvis was  performed following the standard protocol without intravenous  contrast.  Comparison: Scan dated 04/01/2012  Findings: There is scarring at the site of the repair of the  umbilical hernia. There is a tiny defect in the anterior abdominal  wall just above the umbilicus with  protrusion of a small portion of  one small bowel loop through the defect. This is best seen on  image 77 of series 401 and on image number 46 of series 2. There  are several small bowel loops which I suspect are adherent to the  anterior abdominal wall at the site of the prior incision.  Again noted is polycystic liver and kidney disease, unchanged since  the prior study. Pancreas and spleen and adrenal glands are  normal. There is no bowel dilatation. No adenopathy. No acute  osseous abnormality. Broad-based small disc protrusion at L4-5.  IMPRESSION:  Tiny incisional hernia just above the umbilicus and just to the  right of midline.   Assessment    Incisional hernia     Plan  I recommended that he undergo a laparoscopic incisional hernia repair with mesh. This hernia is not actually where his initial hernia was but I think it is a complication of me having to reoperate on him for a bowel obstruction. I discussed with him although this is not urgent this is something that needs to be done and in a timely fashion as I do think there is a risk of incarceration or possibly needing emergency surgery. He understands that completely. We discussed a laparoscopic incisional hernia repair with mesh. I discussed the hospital stay as well as coordinating this with his dialysis which he receives Monday Wednesday and Friday. I will plan on doing this at home hospital. He would like to do this at the end of the summer I can get him scheduled for that today. I will have been his wife come back prior to surgery did discuss this little bit more.        Michael [name] 10/01/2012, 10:53 AM

## 2012-12-25 ENCOUNTER — Encounter (INDEPENDENT_AMBULATORY_CARE_PROVIDER_SITE_OTHER): Payer: Self-pay | Admitting: General Surgery

## 2012-12-25 ENCOUNTER — Ambulatory Visit (INDEPENDENT_AMBULATORY_CARE_PROVIDER_SITE_OTHER): Payer: BC Managed Care – PPO | Admitting: General Surgery

## 2012-12-25 VITALS — BP 138/86 | HR 78 | Temp 97.6°F | Resp 16 | Ht 67.0 in | Wt 201.6 lb

## 2012-12-25 DIAGNOSIS — K432 Incisional hernia without obstruction or gangrene: Secondary | ICD-10-CM

## 2012-12-25 NOTE — Progress Notes (Signed)
Patient ID: Michael [name], male   DOB: 07-May-1955, 57 y.o.   [name]: [id number]  Chief Complaint  Patient presents with  . Pre-op Exam    hernia sx 9/23    HPI Michael [name] is a 57 y.o. male.   HPI This is a 57 year old male with chronic renal insufficiency who had an umbilical hernia. I did a mesh repair of this in an open fashion. He had a lot of scar tissue from a prior right lower quadrant surgery requiring take down a lot of adhesions. About 10 days postoperatively he was readmitted with a bowel obstruction. I was concerned this was related to the placement of the mesh and ended up taking him back to the operating room. This was not related to the mesh but was related to adhesiolysis I had done with early postop bowel obstruction. I ended up doing a small bowel resection and reanastomosed it. I removed his mesh and fixed this hernia primarily at that point.He is now dialyzed via lue fistula three times weekly. He has noted a mass above umbilicus previously that always goes down and does not really bother him except it is there. He is on transplant list now. I sent him to get a ct scan which confirms a hernia at superior aspect of my incision. He came back in today prior to surgery without any real complaints.  Past Medical History  Diagnosis Date  . Hypertension   . Hyperparathyroidism, secondary renal   . H/O cardiac catheterization   . Hyperlipidemia   . Fatigue   . Chronic kidney disease     Polycystic kidney disease, not on diaylsis yet  . Kidney stones     pased one, One Lazer   . ESRD (end stage renal disease)     left av fistula - not on dialysis yet    Past Surgical History  Procedure Laterality Date  . Appendectomy    . Cardiac catheterization  04-05-2010    checking for blockage but none-[facility]  . Av fistula placement  12/05/2011    Procedure: ARTERIOVENOUS (AV) FISTULA CREATION;  Surgeon: Conrad La Junta, MD;  Location: [place];  Service: Vascular;  Laterality: Left;   RADIO-CEPHALIC  fistula left arm  . Av fistula placement  01/11/2012    Procedure: ARTERIOVENOUS (AV) FISTULA CREATION;  Surgeon: Conrad Arrington, MD;  Location: [place];  Service: Vascular;  Laterality: Left;  Creation of left brachial cephalic arteriovenous fistula  . Umbilical hernia repair  03/20/2012    Procedure: HERNIA REPAIR UMBILICAL ADULT;  Surgeon: [name], MD;  Location: [place];  Service: General;  Laterality: N/A;  . Insertion of mesh  03/20/2012    Procedure: INSERTION OF MESH;  Surgeon: [name], MD;  Location: [place];  Service: General;  Laterality: N/A;  . Hernia repair    . Laparotomy  04/02/2012    Procedure: EXPLORATORY LAPAROTOMY;  Surgeon: [name], MD;  Location: [facility] OR;  Service: General;  Laterality: N/A;  Exploratory Laparotomy with resection of small intestine    Family History  Problem Relation Age of Onset  . Heart disease Mother   . Hyperlipidemia Mother   . Hypertension Mother   . Kidney disease Father   . Stroke Father   . Kidney disease Brother     Social History History  Substance Use Topics  . Smoking status: Never Smoker   . Smokeless tobacco: Never Used  . Alcohol Use: No    Allergies  Allergen Reactions  .  Lipitor [Atorvastatin] Itching    Leg pain  . Uloric [Febuxostat]     Leg pain    Current Outpatient Prescriptions  Medication Sig Dispense Refill  . amLODipine (NORVASC) 5 MG tablet Take 10 mg by mouth 2 (two) times daily.       . metoprolol succinate (TOPROL-XL) 100 MG 24 hr tablet Take 100 mg by mouth daily. Take with or immediately following a meal.       No current facility-administered medications for this visit.    Review of Systems Review of Systems  Constitutional: Negative for fever, chills and unexpected weight change.  HENT: Negative for hearing loss, congestion, sore throat, trouble swallowing and voice change.   Eyes: Negative for visual disturbance.  Respiratory: Negative for cough and  wheezing.   Cardiovascular: Negative for chest pain, palpitations and leg swelling.  Gastrointestinal: Negative for nausea, vomiting, abdominal pain, diarrhea, constipation, blood in stool, abdominal distention, anal bleeding and rectal pain.  Genitourinary: Negative for hematuria and difficulty urinating.  Musculoskeletal: Negative for arthralgias.  Skin: Negative for rash and wound.  Neurological: Negative for seizures, syncope, weakness and headaches.  Hematological: Negative for adenopathy. Does not bruise/bleed easily.  Psychiatric/Behavioral: Negative for confusion.    Blood pressure 138/86, pulse 78, temperature 97.6 F (36.4 C), temperature source Temporal, resp. rate 16, height 5\' 7"  (1.702 m), weight 201 lb 9.6 oz (91.445 kg), SpO2 98.00%.  Physical Exam Physical Exam  Vitals reviewed. Constitutional: He appears well-developed and well-nourished.  Eyes: No scleral icterus.  Cardiovascular: Normal rate, regular rhythm and normal heart sounds.   Pulmonary/Chest: Effort normal and breath sounds normal. He has no wheezes. He has no rales.  Abdominal: Soft. Bowel sounds are normal. He exhibits no distension. There is no tenderness. A hernia is present. Hernia confirmed positive in the ventral area.      Data Reviewed CT ABDOMEN AND PELVIS WITHOUT CONTRAST  Technique: Multidetector CT imaging of the abdomen and pelvis was  performed following the standard protocol without intravenous  contrast.  Comparison: Scan dated 04/01/2012  Findings: There is scarring at the site of the repair of the  umbilical hernia. There is a tiny defect in the anterior abdominal  wall just above the umbilicus with protrusion of a small portion of  one small bowel loop through the defect. This is best seen on  image 77 of series 401 and on image number 46 of series 2. There  are several small bowel loops which I suspect are adherent to the  anterior abdominal wall at the site of the prior incision.   Again noted is polycystic liver and kidney disease, unchanged since  the prior study. Pancreas and spleen and adrenal glands are  normal. There is no bowel dilatation. No adenopathy. No acute  osseous abnormality. Broad-based small disc protrusion at L4-5.  IMPRESSION:  Tiny incisional hernia just above the umbilicus and just to the  right of midline.   Assessment    Incisional hernia    Plan    We discussed again a laparoscopic incisional hernia repair with mesh. This hernia is not actually where his initial hernia was but I think it is a complication of me having to reoperate on him for a bowel obstruction. I discussed with him although this is not urgent this is something that needs to be done and in a timely fashion as I do think there is a risk of incarceration or possibly needing emergency surgery. He understands that completely. We discussed a  laparoscopic incisional hernia repair with mesh. I discussed the hospital stay as well as coordinating this with his dialysis which he receives Monday Wednesday and Friday.We went over discussion of risks including but not limited to open procedure, recurrence, ileus, injury to intestines.  He understands hospital stay and recovery period.  Will have nephrology see in hospital for HD        [facility] 12/25/2012, 9:32 AM

## 2013-01-11 ENCOUNTER — Telehealth (INDEPENDENT_AMBULATORY_CARE_PROVIDER_SITE_OTHER): Payer: Self-pay

## 2013-01-11 ENCOUNTER — Other Ambulatory Visit (INDEPENDENT_AMBULATORY_CARE_PROVIDER_SITE_OTHER): Payer: Self-pay | Admitting: General Surgery

## 2013-01-11 NOTE — Telephone Encounter (Signed)
Needs orders in epic on surgery date for 9/23 Lap ventral hernia repair.

## 2013-01-14 NOTE — Pre-Procedure Instructions (Addendum)
Christofer Carrisalez  01/14/2013   Your procedure is scheduled on:  Tuesday, Sept 23rd.   Report to [employer] at   6:15  AM.             Dennis Bast will come through Entrance "A")  Call this number if you have problems the morning of surgery: [phone]   Remember:   Do not eat food or drink liquids after midnight Monday.   Take these medicines the morning of surgery with A SIP OF WATER: Norvasc, Metoprolol   Do not wear jewelry.   Do not wear lotions, powders, or colognes. You may NOT wear deodorant.   Men may shave face and neck.   Do not bring valuables to the hospital.  [facility] is not responsible for any belongings or valuables.  Contacts, dentures or bridgework may not be worn into surgery.   Leave suitcase in the car. After surgery it may be brought to your room.  For patients admitted to the hospital, checkout time is 11:00 AM the day of discharge.   Name and phone number of your driver:    Special Instructions: Shower using CHG 2 nights before surgery and the night before surgery.  If you shower the day of surgery use CHG.  Use special wash - you have one bottle of CHG for all showers.  You should use approximately 1/3 of the bottle for each shower.   Please read over the following fact sheets that you were given: Pain Booklet, Coughing and Deep Breathing and Surgical Site Infection Prevention

## 2013-01-15 ENCOUNTER — Encounter (HOSPITAL_COMMUNITY)
Admission: RE | Admit: 2013-01-15 | Discharge: 2013-01-15 | Disposition: A | Discharge status: Home / Self Care | Payer: BC Managed Care – PPO | Source: Ambulatory Visit | Attending: General Surgery | Admitting: General Surgery

## 2013-01-15 ENCOUNTER — Encounter (HOSPITAL_COMMUNITY)
Admission: RE | Admit: 2013-01-15 | Discharge: 2013-01-15 | Disposition: A | Discharge status: Home / Self Care | Payer: BC Managed Care – PPO | Source: Ambulatory Visit | Attending: Anesthesiology | Admitting: Anesthesiology

## 2013-01-15 ENCOUNTER — Encounter (HOSPITAL_COMMUNITY): Payer: Self-pay

## 2013-01-15 DIAGNOSIS — Z01812 Encounter for preprocedural laboratory examination: Secondary | ICD-10-CM | POA: Insufficient documentation

## 2013-01-15 DIAGNOSIS — Z01810 Encounter for preprocedural cardiovascular examination: Secondary | ICD-10-CM | POA: Insufficient documentation

## 2013-01-15 DIAGNOSIS — Z01818 Encounter for other preprocedural examination: Secondary | ICD-10-CM | POA: Insufficient documentation

## 2013-01-15 LAB — CBC WITH DIFFERENTIAL/PLATELET
Basophils Absolute: 0.1 10*3/uL (ref 0.0–0.1)
Basophils Relative: 1 % (ref 0–1)
Eosinophils Absolute: 0.2 10*3/uL (ref 0.0–0.7)
Eosinophils Relative: 3 % (ref 0–5)
HCT: 47.6 % (ref 39.0–52.0)
Hemoglobin: 16.9 g/dL (ref 13.0–17.0)
Lymphocytes Relative: 29 % (ref 12–46)
Lymphs Abs: 1.7 10*3/uL (ref 0.7–4.0)
MCH: 30 pg (ref 26.0–34.0)
MCHC: 35.5 g/dL (ref 30.0–36.0)
MCV: 84.4 fL (ref 78.0–100.0)
Monocytes Absolute: 0.6 10*3/uL (ref 0.1–1.0)
Monocytes Relative: 10 % (ref 3–12)
Neutro Abs: 3.3 10*3/uL (ref 1.7–7.7)
Neutrophils Relative %: 57 % (ref 43–77)
Platelets: 178 10*3/uL (ref 150–400)
RBC: 5.64 MIL/uL (ref 4.22–5.81)
RDW: 13.8 % (ref 11.5–15.5)
WBC: 5.8 10*3/uL (ref 4.0–10.5)

## 2013-01-15 LAB — BASIC METABOLIC PANEL
BUN: 26 mg/dL — ABNORMAL HIGH (ref 6–23)
CO2: 25 mEq/L (ref 19–32)
Calcium: 10 mg/dL (ref 8.4–10.5)
Chloride: 95 mEq/L — ABNORMAL LOW (ref 96–112)
Creatinine, Ser: 5.46 mg/dL — ABNORMAL HIGH (ref 0.50–1.35)
GFR calc Af Amer: 12 mL/min — ABNORMAL LOW (ref >90)
GFR calc non Af Amer: 11 mL/min — ABNORMAL LOW (ref >90)
Glucose, Bld: 90 mg/dL (ref 70–99)
Potassium: 3.6 mEq/L (ref 3.5–5.1)
Sodium: 136 mEq/L (ref 135–145)

## 2013-01-15 IMAGING — CR DG CHEST 2V
2 series · 2 of 2 positions shown · non-contrast
Comparison: [DATE]

CLINICAL DATA: Preoperative evaluation for hernia surgery

CHEST - 2 VIEW

[w chest pa]
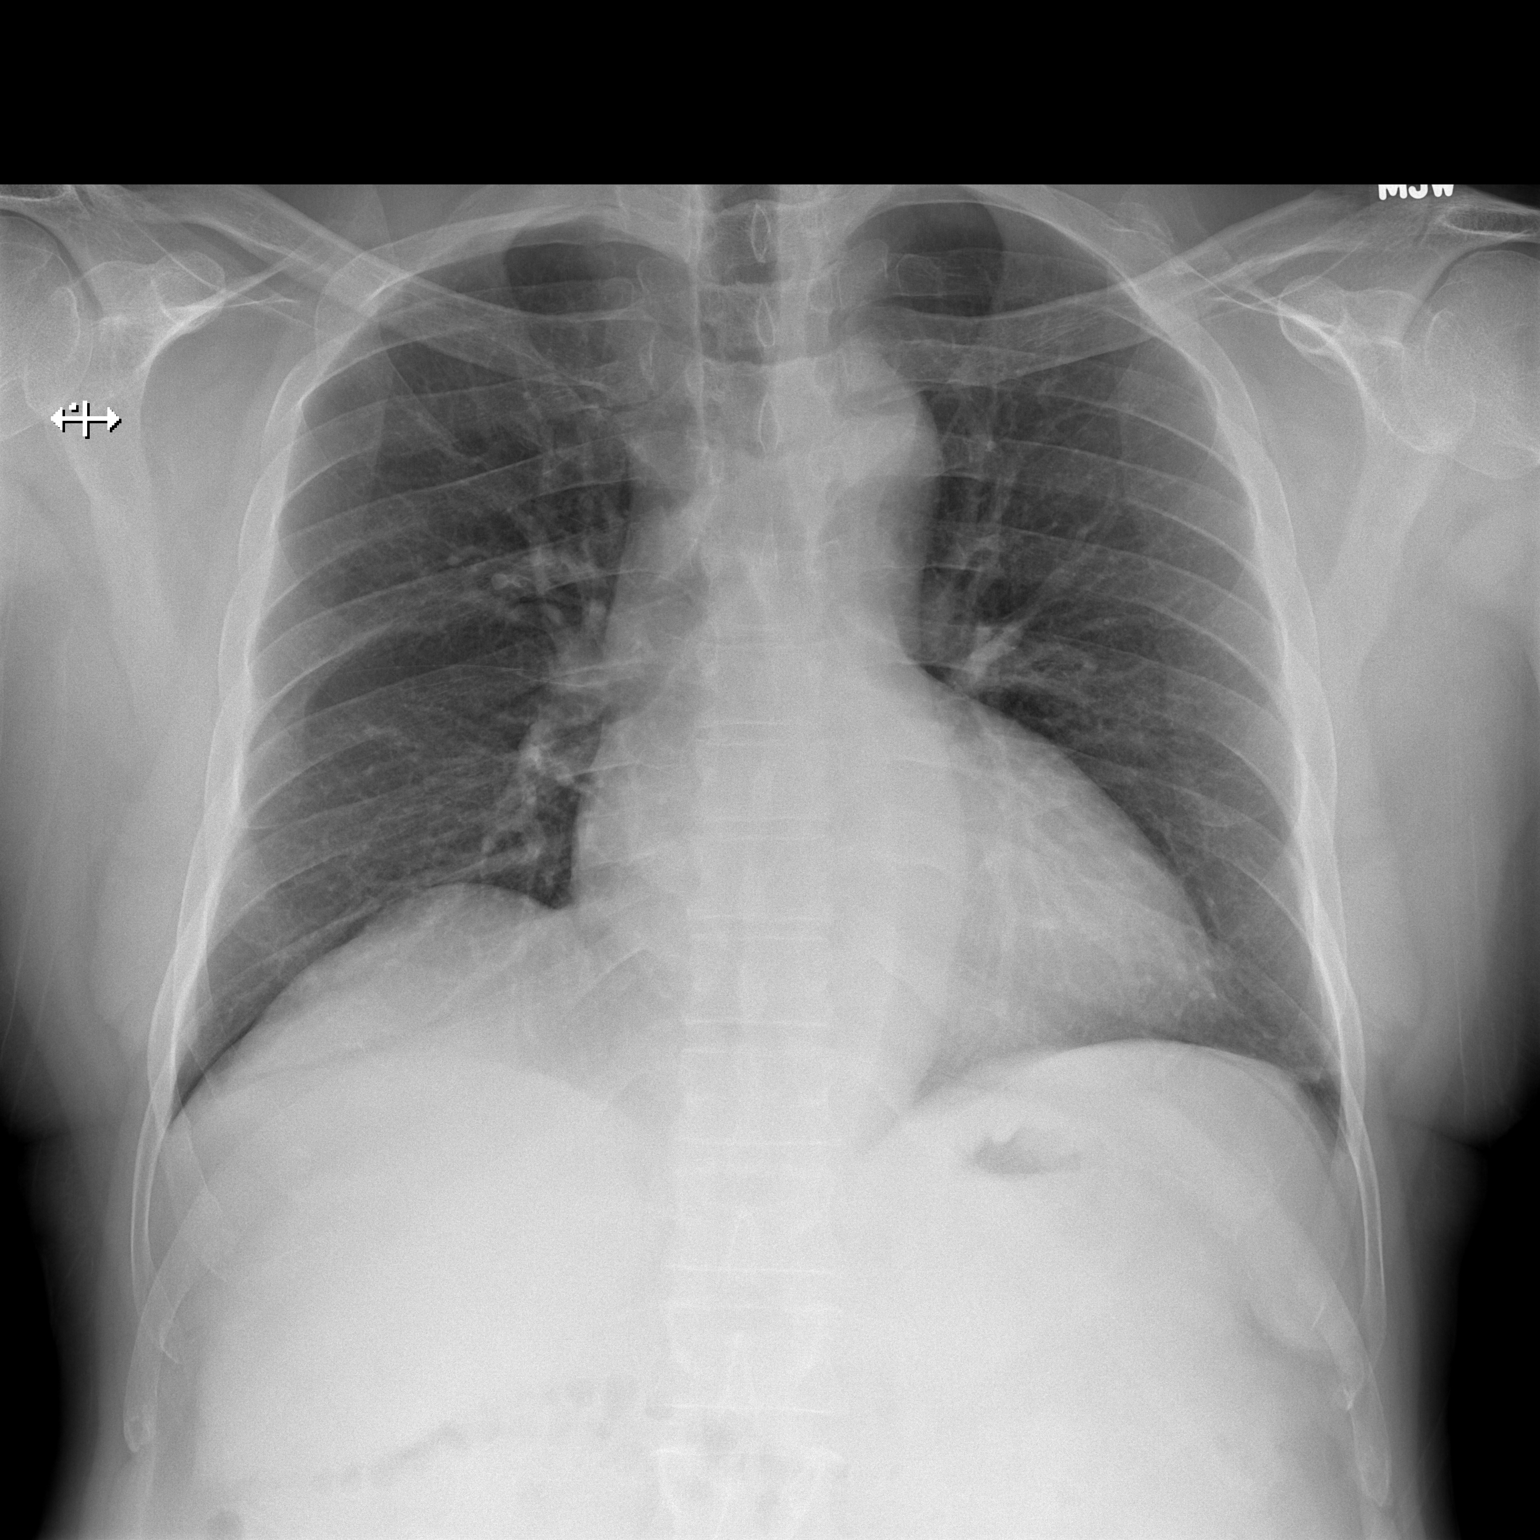

[w chest lat]
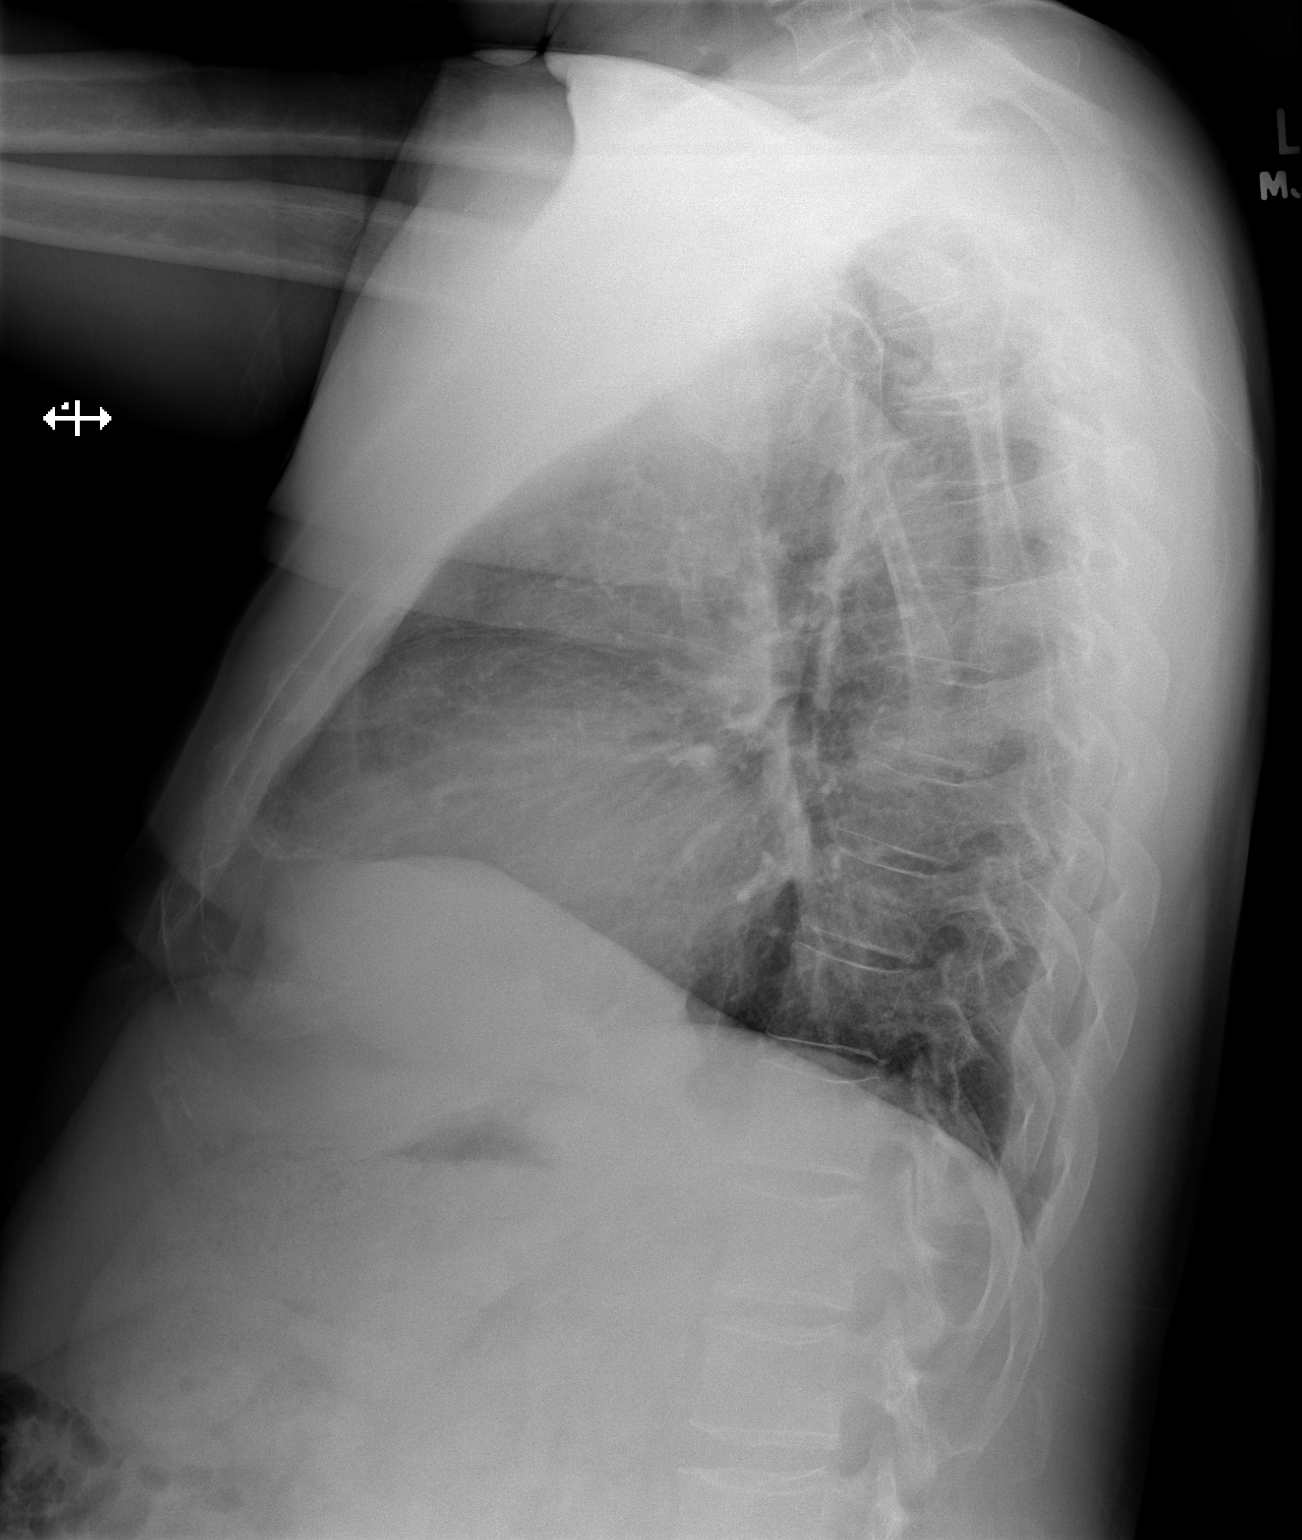

[2 of 2 positions shown; findings below may reference images not displayed]

FINDINGS: The cardiac shadow is at the upper limits of normal in
size.  The lungs are clear bilaterally.  No acute bony abnormality
is seen.
IMPRESSION: No acute abnormality noted.

## 2013-01-15 NOTE — Progress Notes (Signed)
PATIENT ON TRANSPLANT WAITING LIST

## 2013-01-21 MED ORDER — CEFAZOLIN SODIUM-DEXTROSE 2-3 GM-% IV SOLR
2.0000 g | INTRAVENOUS | Status: AC
  Administered 2013-01-22: 2 g via INTRAVENOUS
  Filled 2013-01-21: qty 50

## 2013-01-21 NOTE — Progress Notes (Signed)
Notified patient of time change and instructed him to arrive at 530 am 01/22/13.

## 2013-01-22 ENCOUNTER — Encounter (HOSPITAL_COMMUNITY)
Admission: RE | Disposition: A | Discharge status: Home / Self Care | Payer: Self-pay | Source: Ambulatory Visit | Attending: General Surgery

## 2013-01-22 ENCOUNTER — Inpatient Hospital Stay (HOSPITAL_COMMUNITY)
Admission: RE | Admit: 2013-01-22 | Discharge: 2013-01-26 | DRG: 442 | Disposition: A | Discharge status: Home / Self Care | Payer: BC Managed Care – PPO | Source: Ambulatory Visit | Attending: General Surgery | Admitting: General Surgery

## 2013-01-22 ENCOUNTER — Ambulatory Visit (HOSPITAL_COMMUNITY): Payer: BC Managed Care – PPO | Admitting: Anesthesiology

## 2013-01-22 ENCOUNTER — Encounter (HOSPITAL_COMMUNITY): Payer: Self-pay | Admitting: Surgery

## 2013-01-22 ENCOUNTER — Encounter (HOSPITAL_COMMUNITY): Payer: Self-pay | Admitting: Anesthesiology

## 2013-01-22 DIAGNOSIS — I120 Hypertensive chronic kidney disease with stage 5 chronic kidney disease or end stage renal disease: Secondary | ICD-10-CM | POA: Diagnosis present

## 2013-01-22 DIAGNOSIS — E785 Hyperlipidemia, unspecified: Secondary | ICD-10-CM | POA: Diagnosis present

## 2013-01-22 DIAGNOSIS — Q613 Polycystic kidney, unspecified: Secondary | ICD-10-CM

## 2013-01-22 DIAGNOSIS — D649 Anemia, unspecified: Secondary | ICD-10-CM | POA: Diagnosis not present

## 2013-01-22 DIAGNOSIS — Y832 Surgical operation with anastomosis, bypass or graft as the cause of abnormal reaction of the patient, or of later complication, without mention of misadventure at the time of the procedure: Secondary | ICD-10-CM | POA: Diagnosis present

## 2013-01-22 DIAGNOSIS — N186 End stage renal disease: Secondary | ICD-10-CM | POA: Diagnosis present

## 2013-01-22 DIAGNOSIS — Z5331 Laparoscopic surgical procedure converted to open procedure: Secondary | ICD-10-CM

## 2013-01-22 DIAGNOSIS — K660 Peritoneal adhesions (postprocedural) (postinfection): Secondary | ICD-10-CM | POA: Diagnosis present

## 2013-01-22 DIAGNOSIS — N2581 Secondary hyperparathyroidism of renal origin: Secondary | ICD-10-CM | POA: Diagnosis present

## 2013-01-22 DIAGNOSIS — Z79899 Other long term (current) drug therapy: Secondary | ICD-10-CM

## 2013-01-22 DIAGNOSIS — K432 Incisional hernia without obstruction or gangrene: Secondary | ICD-10-CM

## 2013-01-22 DIAGNOSIS — IMO0002 Reserved for concepts with insufficient information to code with codable children: Principal | ICD-10-CM | POA: Diagnosis present

## 2013-01-22 DIAGNOSIS — Z7682 Awaiting organ transplant status: Secondary | ICD-10-CM

## 2013-01-22 HISTORY — PX: VENTRAL HERNIA REPAIR: SHX424

## 2013-01-22 HISTORY — PX: UMBILICAL HERNIA REPAIR: SHX196

## 2013-01-22 HISTORY — PX: INSERTION OF MESH: SHX5868

## 2013-01-22 LAB — CBC
HCT: 46.7 % (ref 39.0–52.0)
Hemoglobin: 16.4 g/dL (ref 13.0–17.0)
MCH: 29.9 pg (ref 26.0–34.0)
MCHC: 35.1 g/dL (ref 30.0–36.0)
MCV: 85.1 fL (ref 78.0–100.0)
Platelets: 199 10*3/uL (ref 150–400)
RBC: 5.49 MIL/uL (ref 4.22–5.81)
RDW: 13.9 % (ref 11.5–15.5)
WBC: 14.8 10*3/uL — ABNORMAL HIGH (ref 4.0–10.5)

## 2013-01-22 LAB — POCT I-STAT 4, (NA,K, GLUC, HGB,HCT)
Glucose, Bld: 95 mg/dL (ref 70–99)
HCT: 49 % (ref 39.0–52.0)
Hemoglobin: 16.7 g/dL (ref 13.0–17.0)
Potassium: 3.5 mEq/L (ref 3.5–5.1)
Sodium: 139 mEq/L (ref 135–145)

## 2013-01-22 LAB — CREATININE, SERUM
Creatinine, Ser: 6.05 mg/dL — ABNORMAL HIGH (ref 0.50–1.35)
GFR calc Af Amer: 11 mL/min — ABNORMAL LOW (ref >90)
GFR calc non Af Amer: 9 mL/min — ABNORMAL LOW (ref >90)

## 2013-01-22 SURGERY — REPAIR, HERNIA, VENTRAL, LAPAROSCOPIC
Anesthesia: General | Site: Abdomen | Wound class: Clean

## 2013-01-22 MED ORDER — PHENYLEPHRINE HCL 10 MG/ML IJ SOLN
INTRAMUSCULAR | Status: DC | PRN
  Administered 2013-01-22 (×2): 80 ug via INTRAVENOUS
  Administered 2013-01-22: 40 ug via INTRAVENOUS
  Administered 2013-01-22: 80 ug via INTRAVENOUS
  Administered 2013-01-22: 120 ug via INTRAVENOUS

## 2013-01-22 MED ORDER — FENTANYL CITRATE 0.05 MG/ML IJ SOLN
25.0000 ug | INTRAMUSCULAR | Status: DC | PRN
  Administered 2013-01-22: 50 ug via INTRAVENOUS

## 2013-01-22 MED ORDER — SODIUM CHLORIDE 0.9 % IR SOLN
Status: DC | PRN
  Administered 2013-01-22: 1000 mL

## 2013-01-22 MED ORDER — DIPHENHYDRAMINE HCL 12.5 MG/5ML PO ELIX: 12.5000 mg | ORAL_SOLUTION | Freq: Four times a day (QID) | ORAL | Status: DC | PRN

## 2013-01-22 MED ORDER — ACETAMINOPHEN 325 MG PO TABS: 650.0000 mg | ORAL_TABLET | Freq: Four times a day (QID) | ORAL | Status: DC | PRN

## 2013-01-22 MED ORDER — FENTANYL CITRATE 0.05 MG/ML IJ SOLN
INTRAMUSCULAR | Status: AC
  Filled 2013-01-22: qty 2

## 2013-01-22 MED ORDER — ONDANSETRON HCL 4 MG/2ML IJ SOLN: 4.0000 mg | Freq: Four times a day (QID) | INTRAMUSCULAR | Status: DC | PRN

## 2013-01-22 MED ORDER — NEOSTIGMINE METHYLSULFATE 1 MG/ML IJ SOLN
INTRAMUSCULAR | Status: DC | PRN
  Administered 2013-01-22: 3 mg via INTRAVENOUS

## 2013-01-22 MED ORDER — DIPHENHYDRAMINE HCL 50 MG/ML IJ SOLN: 12.5000 mg | Freq: Four times a day (QID) | INTRAMUSCULAR | Status: DC | PRN

## 2013-01-22 MED ORDER — ONDANSETRON HCL 4 MG/2ML IJ SOLN
INTRAMUSCULAR | Status: DC | PRN
  Administered 2013-01-22: 4 mg via INTRAVENOUS

## 2013-01-22 MED ORDER — RENA-VITE PO TABS
1.0000 | ORAL_TABLET | Freq: Every day | ORAL | Status: DC
  Administered 2013-01-25: 1 via ORAL
  Filled 2013-01-22 (×5): qty 1

## 2013-01-22 MED ORDER — MIDAZOLAM HCL 5 MG/5ML IJ SOLN
INTRAMUSCULAR | Status: DC | PRN
  Administered 2013-01-22: 2 mg via INTRAVENOUS

## 2013-01-22 MED ORDER — MORPHINE SULFATE (PF) 1 MG/ML IV SOLN
INTRAVENOUS | Status: DC
Start: 2013-01-22 — End: 2013-01-24
  Administered 2013-01-22: 11:00:00 via INTRAVENOUS
  Administered 2013-01-22: 3 mg via INTRAVENOUS
  Administered 2013-01-23: 1 mg via INTRAVENOUS
  Administered 2013-01-23 (×2): 3 mg via INTRAVENOUS
  Administered 2013-01-23: 2 mg via INTRAVENOUS
  Administered 2013-01-23 (×2): 1 mg via INTRAVENOUS

## 2013-01-22 MED ORDER — NALOXONE HCL 0.4 MG/ML IJ SOLN: 0.4000 mg | INTRAMUSCULAR | Status: DC | PRN

## 2013-01-22 MED ORDER — OXYCODONE HCL 5 MG PO TABS: 5.0000 mg | ORAL_TABLET | Freq: Once | ORAL | Status: DC | PRN

## 2013-01-22 MED ORDER — DOUBLE ANTIBIOTIC 500-10000 UNIT/GM EX OINT
TOPICAL_OINTMENT | CUTANEOUS | Status: AC
  Filled 2013-01-22: qty 1

## 2013-01-22 MED ORDER — SODIUM CHLORIDE 0.9 % IJ SOLN: 9.0000 mL | INTRAMUSCULAR | Status: DC | PRN

## 2013-01-22 MED ORDER — LACTATED RINGERS IV SOLN
INTRAVENOUS | Status: DC | PRN
  Administered 2013-01-22: 07:00:00 via INTRAVENOUS

## 2013-01-22 MED ORDER — ACETAMINOPHEN 650 MG RE SUPP: 650.0000 mg | Freq: Four times a day (QID) | RECTAL | Status: DC | PRN

## 2013-01-22 MED ORDER — OXYCODONE HCL 5 MG/5ML PO SOLN: 5.0000 mg | Freq: Once | ORAL | Status: DC | PRN

## 2013-01-22 MED ORDER — MORPHINE SULFATE (PF) 1 MG/ML IV SOLN
INTRAVENOUS | Status: AC
  Filled 2013-01-22: qty 25

## 2013-01-22 MED ORDER — DOXERCALCIFEROL 4 MCG/2ML IV SOLN
2.0000 ug | INTRAVENOUS | Status: DC
  Administered 2013-01-23 – 2013-01-25 (×2): 2 ug via INTRAVENOUS
  Filled 2013-01-22 (×2): qty 2

## 2013-01-22 MED ORDER — HYDROMORPHONE HCL PF 1 MG/ML IJ SOLN
INTRAMUSCULAR | Status: DC | PRN
  Administered 2013-01-22 (×2): 0.5 mg via INTRAVENOUS

## 2013-01-22 MED ORDER — BUPIVACAINE HCL (PF) 0.25 % IJ SOLN
INTRAMUSCULAR | Status: DC | PRN
  Administered 2013-01-22: 6 mL

## 2013-01-22 MED ORDER — SODIUM CHLORIDE 0.9 % IV SOLN
INTRAVENOUS | Status: DC | PRN
  Administered 2013-01-22 (×2): via INTRAVENOUS

## 2013-01-22 MED ORDER — SODIUM CHLORIDE 0.9 % IV SOLN
INTRAVENOUS | Status: DC
  Administered 2013-01-23: 14:00:00 via INTRAVENOUS

## 2013-01-22 MED ORDER — METOPROLOL SUCCINATE ER 100 MG PO TB24
100.0000 mg | ORAL_TABLET | Freq: Every day | ORAL | Status: DC
  Administered 2013-01-22: 100 mg via ORAL
  Filled 2013-01-22 (×2): qty 1

## 2013-01-22 MED ORDER — ROCURONIUM BROMIDE 100 MG/10ML IV SOLN
INTRAVENOUS | Status: DC | PRN
  Administered 2013-01-22: 10 mg via INTRAVENOUS
  Administered 2013-01-22 (×3): 20 mg via INTRAVENOUS
  Administered 2013-01-22: 30 mg via INTRAVENOUS
  Administered 2013-01-22: 10 mg via INTRAVENOUS

## 2013-01-22 MED ORDER — GLYCOPYRROLATE 0.2 MG/ML IJ SOLN
INTRAMUSCULAR | Status: DC | PRN
  Administered 2013-01-22: 0.4 mg via INTRAVENOUS

## 2013-01-22 MED ORDER — LIDOCAINE HCL (CARDIAC) 20 MG/ML IV SOLN
INTRAVENOUS | Status: DC | PRN
  Administered 2013-01-22: 60 mg via INTRAVENOUS

## 2013-01-22 MED ORDER — FENTANYL CITRATE 0.05 MG/ML IJ SOLN
INTRAMUSCULAR | Status: DC | PRN
  Administered 2013-01-22: 50 ug via INTRAVENOUS
  Administered 2013-01-22: 100 ug via INTRAVENOUS
  Administered 2013-01-22 (×2): 50 ug via INTRAVENOUS

## 2013-01-22 MED ORDER — PROPOFOL 10 MG/ML IV BOLUS
INTRAVENOUS | Status: DC | PRN
  Administered 2013-01-22: 170 mg via INTRAVENOUS

## 2013-01-22 MED ORDER — HEPARIN SODIUM (PORCINE) 5000 UNIT/ML IJ SOLN
5000.0000 [IU] | Freq: Three times a day (TID) | INTRAMUSCULAR | Status: DC
  Administered 2013-01-23 – 2013-01-26 (×9): 5000 [IU] via SUBCUTANEOUS
  Filled 2013-01-22 (×14): qty 1

## 2013-01-22 SURGICAL SUPPLY — 75 items
ADH SKN CLS APL DERMABOND .7 (GAUZE/BANDAGES/DRESSINGS) ×1
APPLIER CLIP 5 13 M/L LIGAMAX5 (MISCELLANEOUS)
APPLIER CLIP ROT 10 11.4 M/L (STAPLE)
APR CLP MED LRG 11.4X10 (STAPLE)
APR CLP MED LRG 5 ANG JAW (MISCELLANEOUS)
BANDAGE GAUZE ELAST BULKY 4 IN (GAUZE/BANDAGES/DRESSINGS) IMPLANT
BINDER ABD UNIV 10 28-50 (GAUZE/BANDAGES/DRESSINGS) IMPLANT
BINDER ABD UNIV 12 45-62 (WOUND CARE) ×1 IMPLANT
BINDER ABDOM UNIV 10 (GAUZE/BANDAGES/DRESSINGS) ×2
BINDER ABDOMINAL 46IN 62IN (WOUND CARE)
BLADE SURG 10 STRL SS (BLADE) ×1 IMPLANT
CANISTER SUCTION 2500CC (MISCELLANEOUS) ×1 IMPLANT
CATH ROBINSON RED A/P 14FR (CATHETERS) ×1 IMPLANT
CHLORAPREP W/TINT 26ML (MISCELLANEOUS) ×2 IMPLANT
CLIP APPLIE 5 13 M/L LIGAMAX5 (MISCELLANEOUS) IMPLANT
CLIP APPLIE ROT 10 11.4 M/L (STAPLE) IMPLANT
CLOTH BEACON ORANGE TIMEOUT ST (SAFETY) ×2 IMPLANT
COVER SURGICAL LIGHT HANDLE (MISCELLANEOUS) ×2 IMPLANT
DERMABOND ADVANCED (GAUZE/BANDAGES/DRESSINGS) ×1
DERMABOND ADVANCED .7 DNX12 (GAUZE/BANDAGES/DRESSINGS) ×1 IMPLANT
DEVICE TROCAR PUNCTURE CLOSURE (ENDOMECHANICALS) ×2 IMPLANT
DRAIN CHANNEL 19F RND (DRAIN) ×2 IMPLANT
DRAPE INCISE IOBAN 66X45 STRL (DRAPES) ×2 IMPLANT
DRAPE WARM FLUID 44X44 (DRAPE) ×2 IMPLANT
DRSG COVADERM 4X10 (GAUZE/BANDAGES/DRESSINGS) ×1 IMPLANT
ELECT CAUTERY BLADE 6.4 (BLADE) ×1 IMPLANT
ELECT REM PT RETURN 9FT ADLT (ELECTROSURGICAL) ×2
ELECTRODE REM PT RTRN 9FT ADLT (ELECTROSURGICAL) ×1 IMPLANT
EVACUATOR SILICONE 100CC (DRAIN) ×2 IMPLANT
GEL ULTRASOUND 20GR AQUASONIC (MISCELLANEOUS) ×1 IMPLANT
GLOVE BIO SURGEON STRL SZ 6.5 (GLOVE) ×2 IMPLANT
GLOVE BIO SURGEON STRL SZ7 (GLOVE) ×2 IMPLANT
GLOVE BIO SURGEON STRL SZ8 (GLOVE) ×1 IMPLANT
GLOVE BIOGEL PI IND STRL 7.0 (GLOVE) IMPLANT
GLOVE BIOGEL PI IND STRL 7.5 (GLOVE) ×1 IMPLANT
GLOVE BIOGEL PI IND STRL 8 (GLOVE) IMPLANT
GLOVE BIOGEL PI INDICATOR 7.0 (GLOVE) ×1
GLOVE BIOGEL PI INDICATOR 7.5 (GLOVE) ×1
GLOVE BIOGEL PI INDICATOR 8 (GLOVE) ×1
GLOVE SURG SS PI 7.0 STRL IVOR (GLOVE) ×1 IMPLANT
GOWN PREVENTION PLUS XLARGE (GOWN DISPOSABLE) ×1 IMPLANT
GOWN STRL NON-REIN LRG LVL3 (GOWN DISPOSABLE) ×5 IMPLANT
KIT BASIN OR (CUSTOM PROCEDURE TRAY) ×2 IMPLANT
KIT ROOM TURNOVER OR (KITS) ×2 IMPLANT
MARKER SKIN DUAL TIP RULER LAB (MISCELLANEOUS) ×2 IMPLANT
MESH VENT ST 19.6X24.6CM OVL (Mesh General) ×1 IMPLANT
NDL SPNL 22GX3.5 QUINCKE BK (NEEDLE) ×1 IMPLANT
NEEDLE SPNL 22GX3.5 QUINCKE BK (NEEDLE) ×2 IMPLANT
NS IRRIG 1000ML POUR BTL (IV SOLUTION) ×4 IMPLANT
PAD ARMBOARD 7.5X6 YLW CONV (MISCELLANEOUS) ×4 IMPLANT
PENCIL BUTTON HOLSTER BLD 10FT (ELECTRODE) ×1 IMPLANT
SCALPEL HARMONIC ACE (MISCELLANEOUS) IMPLANT
SCISSORS LAP 5X35 DISP (ENDOMECHANICALS) IMPLANT
SET IRRIG TUBING LAPAROSCOPIC (IRRIGATION / IRRIGATOR) IMPLANT
SLEEVE ENDOPATH XCEL 5M (ENDOMECHANICALS) ×2 IMPLANT
SPONGE GAUZE 4X4 12PLY (GAUZE/BANDAGES/DRESSINGS) ×1 IMPLANT
STAPLER VISISTAT 35W (STAPLE) ×1 IMPLANT
SUT ETHILON 2 0 FS 18 (SUTURE) ×2 IMPLANT
SUT MNCRL AB 4-0 PS2 18 (SUTURE) ×2 IMPLANT
SUT NOVA 1 T20/GS 25DT (SUTURE) ×5 IMPLANT
SUT NOVA T20/GS 25 (SUTURE) ×1 IMPLANT
SUT PROLENE 0 CT 1 CR/8 (SUTURE) ×2 IMPLANT
SUT VIC AB 3-0 SH 18 (SUTURE) ×1 IMPLANT
TACKER 5MM HERNIA 3.5CML NAB (ENDOMECHANICALS) IMPLANT
TAPE CLOTH SURG 6X10 WHT LF (GAUZE/BANDAGES/DRESSINGS) ×1 IMPLANT
TOWEL OR 17X24 6PK STRL BLUE (TOWEL DISPOSABLE) ×1 IMPLANT
TOWEL OR 17X26 10 PK STRL BLUE (TOWEL DISPOSABLE) ×2 IMPLANT
TRAY FOLEY CATH 16FR SILVER (SET/KITS/TRAYS/PACK) ×1 IMPLANT
TRAY LAPAROSCOPIC (CUSTOM PROCEDURE TRAY) ×2 IMPLANT
TROCAR XCEL BLUNT TIP 100MML (ENDOMECHANICALS) IMPLANT
TROCAR XCEL NON-BLD 11X100MML (ENDOMECHANICALS) ×2 IMPLANT
TROCAR XCEL NON-BLD 5MMX100MML (ENDOMECHANICALS) ×2 IMPLANT
TUBE CONNECTING 12X1/4 (SUCTIONS) ×1 IMPLANT
WATER STERILE IRR 1000ML POUR (IV SOLUTION) IMPLANT
YANKAUER SUCT BULB TIP NO VENT (SUCTIONS) ×1 IMPLANT

## 2013-01-22 NOTE — Anesthesia Preprocedure Evaluation (Signed)
Anesthesia Evaluation  Patient identified by [name], date of birth, ID band Patient awake    Reviewed: Allergy & Precautions, H&P , NPO status , Patient's Chart, lab work & pertinent test results  Airway Mallampati: II  Neck ROM: full    Dental   Pulmonary          Cardiovascular hypertension,     Neuro/Psych    GI/Hepatic   Endo/Other  obese  Renal/GU ESRF and DialysisRenal disease     Musculoskeletal   Abdominal   Peds  Hematology   Anesthesia Other Findings   Reproductive/Obstetrics                           Anesthesia Physical Anesthesia Plan  ASA: III  Anesthesia Plan: General   Post-op Pain Management:    Induction: Intravenous  Airway Management Planned: Oral ETT  Additional Equipment:   Intra-op Plan:   Post-operative Plan: Extubation in OR  Informed Consent: I have reviewed the patients History and Physical, chart, labs and discussed the procedure including the risks, benefits and alternatives for the proposed anesthesia with the patient or authorized representative who has indicated his/her understanding and acceptance.     Plan Discussed with: CRNA, Anesthesiologist and Surgeon  Anesthesia Plan Comments:         Anesthesia Quick Evaluation

## 2013-01-22 NOTE — Interval H&P Note (Signed)
History and Physical Interval Note:  01/22/2013 6:46 AM  Michael [name]  has presented today for surgery, with the diagnosis of repair hernia with mesh using small incisions  The various methods of treatment have been discussed with the patient and family. After consideration of risks, benefits and other options for treatment, the patient has consented to  Procedure(s): LAPAROSCOPIC VENTRAL HERNIA (N/A) INSERTION OF MESH (N/A) as a surgical intervention .  The patient's history has been reviewed, patient examined, no change in status, stable for surgery.  I have reviewed the patient's chart and labs.  Questions were answered to the patient's satisfaction.     Korin Setzler

## 2013-01-22 NOTE — Anesthesia Procedure Notes (Signed)
Procedure Name: Intubation Date/Time: 01/22/2013 7:23 AM Performed by: Manuela Schwartz B Pre-anesthesia Checklist: Patient identified, Emergency Drugs available, Suction available, Patient being monitored and Timeout performed Patient Re-evaluated:Patient Re-evaluated prior to inductionOxygen Delivery Method: Circle system utilized Preoxygenation: Pre-oxygenation with 100% oxygen Intubation Type: IV induction Ventilation: Two handed mask ventilation required and Oral airway inserted - appropriate to patient size Laryngoscope Size: Mac and 4 Grade View: Grade II Tube type: Oral Tube size: 7.5 mm Number of attempts: 1 Airway Equipment and Method: Stylet Placement Confirmation: ETT inserted through vocal cords under direct vision,  positive ETCO2 and breath sounds checked- equal and bilateral Secured at: 23 cm Tube secured with: Tape Dental Injury: Teeth and Oropharynx as per pre-operative assessment

## 2013-01-22 NOTE — H&P (View-Only) (Signed)
Patient ID: Michael [name], male   DOB: 10/23/1955, 57 y.o.   [name]: [id number]  Chief Complaint  Patient presents with  . Pre-op Exam    hernia sx 9/23    HPI Michael [name] is a 57 y.o. male.   HPI This is a 57 year old male with chronic renal insufficiency who had an umbilical hernia. I did a mesh repair of this in an open fashion. He had a lot of scar tissue from a prior right lower quadrant surgery requiring take down a lot of adhesions. About 10 days postoperatively he was readmitted with a bowel obstruction. I was concerned this was related to the placement of the mesh and ended up taking him back to the operating room. This was not related to the mesh but was related to adhesiolysis I had done with early postop bowel obstruction. I ended up doing a small bowel resection and reanastomosed it. I removed his mesh and fixed this hernia primarily at that point.He is now dialyzed via lue fistula three times weekly. He has noted a mass above umbilicus previously that always goes down and does not really bother him except it is there. He is on transplant list now. I sent him to get a ct scan which confirms a hernia at superior aspect of my incision. He came back in today prior to surgery without any real complaints.  Past Medical History  Diagnosis Date  . Hypertension   . Hyperparathyroidism, secondary renal   . H/O cardiac catheterization   . Hyperlipidemia   . Fatigue   . Chronic kidney disease     Polycystic kidney disease, not on diaylsis yet  . Kidney stones     pased one, One Lazer   . ESRD (end stage renal disease)     left av fistula - not on dialysis yet    Past Surgical History  Procedure Laterality Date  . Appendectomy    . Cardiac catheterization  04-05-2010    checking for blockage but none-[facility]  . Av fistula placement  12/05/2011    Procedure: ARTERIOVENOUS (AV) FISTULA CREATION;  Surgeon: Conrad [place], MD;  Location: [place];  Service: Vascular;  Laterality: Left;   RADIO-CEPHALIC  fistula left arm  . Av fistula placement  01/11/2012    Procedure: ARTERIOVENOUS (AV) FISTULA CREATION;  Surgeon: Conrad [place], MD;  Location: [place];  Service: Vascular;  Laterality: Left;  Creation of left brachial cephalic arteriovenous fistula  . Umbilical hernia repair  03/20/2012    Procedure: HERNIA REPAIR UMBILICAL ADULT;  Surgeon: [name], MD;  Location: [place];  Service: General;  Laterality: N/A;  . Insertion of mesh  03/20/2012    Procedure: INSERTION OF MESH;  Surgeon: [name], MD;  Location: [place];  Service: General;  Laterality: N/A;  . Hernia repair    . Laparotomy  04/02/2012    Procedure: EXPLORATORY LAPAROTOMY;  Surgeon: [name], MD;  Location: [facility] OR;  Service: General;  Laterality: N/A;  Exploratory Laparotomy with resection of small intestine    Family History  Problem Relation Age of Onset  . Heart disease Mother   . Hyperlipidemia Mother   . Hypertension Mother   . Kidney disease Father   . Stroke Father   . Kidney disease Brother     Social History History  Substance Use Topics  . Smoking status: Never Smoker   . Smokeless tobacco: Never Used  . Alcohol Use: No    Allergies  Allergen Reactions  .  Lipitor [Atorvastatin] Itching    Leg pain  . Uloric [Febuxostat]     Leg pain    Current Outpatient Prescriptions  Medication Sig Dispense Refill  . amLODipine (NORVASC) 5 MG tablet Take 10 mg by mouth 2 (two) times daily.       . metoprolol succinate (TOPROL-XL) 100 MG 24 hr tablet Take 100 mg by mouth daily. Take with or immediately following a meal.       No current facility-administered medications for this visit.    Review of Systems Review of Systems  Constitutional: Negative for fever, chills and unexpected weight change.  HENT: Negative for hearing loss, congestion, sore throat, trouble swallowing and voice change.   Eyes: Negative for visual disturbance.  Respiratory: Negative for cough and  wheezing.   Cardiovascular: Negative for chest pain, palpitations and leg swelling.  Gastrointestinal: Negative for nausea, vomiting, abdominal pain, diarrhea, constipation, blood in stool, abdominal distention, anal bleeding and rectal pain.  Genitourinary: Negative for hematuria and difficulty urinating.  Musculoskeletal: Negative for arthralgias.  Skin: Negative for rash and wound.  Neurological: Negative for seizures, syncope, weakness and headaches.  Hematological: Negative for adenopathy. Does not bruise/bleed easily.  Psychiatric/Behavioral: Negative for confusion.    Blood pressure 138/86, pulse 78, temperature 97.6 F (36.4 C), temperature source Temporal, resp. rate 16, height 5\' 7"  (1.702 m), weight 201 lb 9.6 oz (91.445 kg), SpO2 98.00%.  Physical Exam Physical Exam  Vitals reviewed. Constitutional: He appears well-developed and well-nourished.  Eyes: No scleral icterus.  Cardiovascular: Normal rate, regular rhythm and normal heart sounds.   Pulmonary/Chest: Effort normal and breath sounds normal. He has no wheezes. He has no rales.  Abdominal: Soft. Bowel sounds are normal. He exhibits no distension. There is no tenderness. A hernia is present. Hernia confirmed positive in the ventral area.      Data Reviewed CT ABDOMEN AND PELVIS WITHOUT CONTRAST  Technique: Multidetector CT imaging of the abdomen and pelvis was  performed following the standard protocol without intravenous  contrast.  Comparison: Scan dated 04/01/2012  Findings: There is scarring at the site of the repair of the  umbilical hernia. There is a tiny defect in the anterior abdominal  wall just above the umbilicus with protrusion of a small portion of  one small bowel loop through the defect. This is best seen on  image 77 of series 401 and on image number 46 of series 2. There  are several small bowel loops which I suspect are adherent to the  anterior abdominal wall at the site of the prior incision.   Again noted is polycystic liver and kidney disease, unchanged since  the prior study. Pancreas and spleen and adrenal glands are  normal. There is no bowel dilatation. No adenopathy. No acute  osseous abnormality. Broad-based small disc protrusion at L4-5.  IMPRESSION:  Tiny incisional hernia just above the umbilicus and just to the  right of midline.   Assessment    Incisional hernia    Plan    We discussed again a laparoscopic incisional hernia repair with mesh. This hernia is not actually where his initial hernia was but I think it is a complication of me having to reoperate on him for a bowel obstruction. I discussed with him although this is not urgent this is something that needs to be done and in a timely fashion as I do think there is a risk of incarceration or possibly needing emergency surgery. He understands that completely. We discussed a  laparoscopic incisional hernia repair with mesh. I discussed the hospital stay as well as coordinating this with his dialysis which he receives Monday Wednesday and Friday.We went over discussion of risks including but not limited to open procedure, recurrence, ileus, injury to intestines.  He understands hospital stay and recovery period.  Will have nephrology see in hospital for HD        [facility] 12/25/2012, 9:32 AM

## 2013-01-22 NOTE — Anesthesia Postprocedure Evaluation (Signed)
Anesthesia Post Note  Patient: Michael [name]  Procedure(s) Performed: Procedure(s) (LRB): ATTEMPTED LAPAROSCOPIC VENTRAL HERNIA CONVERTED TO OPEN (N/A) INSERTION OF MESH (N/A)  Anesthesia type: General  Patient location: PACU  Post pain: Pain level controlled and Adequate analgesia  Post assessment: Post-op Vital signs reviewed, Patient's Cardiovascular Status Stable, Respiratory Function Stable, Patent Airway and Pain level controlled  Last Vitals:  Filed Vitals:   01/22/13 1130  BP: 152/87  Pulse: 81  Temp:   Resp: 20    Post vital signs: Reviewed and stable  Level of consciousness: awake, alert  and oriented  Complications: No apparent anesthesia complications

## 2013-01-22 NOTE — Progress Notes (Addendum)
Renal Service Daily Progress Note [facility]  Subjective: On dialysis, abd sore, no other complaints  Physical Exam:  Blood pressure 137/90, pulse 97, temperature 97.8 F (36.6 C), temperature source Oral, resp. rate 20, weight 95.754 kg (211 lb 1.6 oz), SpO2 94.00%. Gen: WD, WN AAM in no distress, on dialysis Skin: no rash, cyanosis HEENT:  EOMI, sclera anicteric, throat clear Neck: no JVD, no LAN Chest: clear to bases bilat CV: regular, no M or rub, pedal pulses intact Abdomen: soft, large abdominal wrap in place, + BS Ext: no LE or UE edema, no gangrene/ulcers Neuro: alert, nonfocal, Ox3 Access: LUA AVF patent  Dialysis Orders: MWF @ [facility]  88.5 kg    4hr 48min    2K/2Ca    Heparin 8400    LUA AVF    450/A1.5  Hectorol 23mcg    Epogen none    Venofer none  Labs: Hgb 16. 8/27: P 4.2, Ca 9.9, Alb 4.1   Assessment:  1. Incisional abdominal wall hernia repair (9/23 / Dr. Donne Hazel)- stable postop, on PCA 2. ESRD, HD MWF  3. HTN/volume- up 5kg, on toprol > UF 3-4kg as tol today 4. Anemia - Hgb high, no ESAs or IV Fe 5. Metabolic bone disease - Ca and Phos controlled op. Continue Hectorol 67mcg 6. Nutrition - Last Alb 4.1 On clears. Renal diet when appropriate. Multivitamin 7. Dispo - per primary  Plan: HD today   Kelly Splinter  MD Pager [phone]    Cell  [phone] 01/23/2013, 8:55 AM    Recent Labs Lab 01/22/13 0614 01/22/13 1612  NA 139  --   K 3.5  --   GLUCOSE 95  --   CREATININE  --  6.05*   No results found for this basename: AST, ALT, ALKPHOS, BILITOT, PROT, ALBUMIN,  in the last 168 hours  Recent Labs Lab 01/22/13 0614 01/22/13 1612 01/23/13 0710  WBC  --  14.8* 11.7*  HGB 16.7 16.4 15.4  HCT 49.0 46.7 45.7  MCV  --  85.1 86.2  PLT  --  199 191   . doxercalciferol  2 mcg Intravenous Q M,W,F-HD  . heparin  5,000 Units Subcutaneous Q8H  . metoprolol succinate  100 mg Oral Daily  . morphine   Intravenous Q4H  . multivitamin  1 tablet  Oral QHS   . sodium chloride 10 mL/hr at 01/22/13 1500   acetaminophen, acetaminophen, diphenhydrAMINE, diphenhydrAMINE, naloxone, ondansetron (ZOFRAN) IV, sodium chloride

## 2013-01-22 NOTE — Op Note (Signed)
Preoperative diagnosis: Incisional hernia Postoperative diagnosis: Same as above Procedure: #1 diagnostic laparoscopy with lysis of adhesions x 20 minutes #2 open incisional hernia repair in a retrorectus fashion with a 19 x 24 cm ventrio mesh #3 open lysis of adhesions x40 minutes Surgeon: Dr. Serita Grammes Anesthesia: Gen. Complications: None Estimated blood loss: 50 cc Drains: 19 French Blake drain overlying the preperitoneal plane Sponge needle count correct at completion Disposition to recovery stable  Indications: This is a 69 yom who had a prior history of an appendectomy as a child for a low midline incision. I did an umbilical hernia repair on him that was complicated by a small bowel obstruction not related to his mesh. I ended up having to return into the operating room and a bowel resection as well as removed by mesh and primarily close this fascia. Subsequent to this his ESRD has progressed and he is now on  dialysis as well as he has also had a recurrence of his hernia at the superior aspect of the incision. He and I discussed going to the operating room and repairing this. We discussed a laparoscopic possible open repair.  Procedure: After informed consent obtained the patient was taken to the operating room. He was given cefazolin prior to beginning. He was placed in sequential compression devices. He was in placed under general anesthesia without complication. His abdomen was prepped and draped in the standard sterile surgical fashion. A piece of ioban was placed overlying this.In and out catheterization was performed to ensure that he had no urine present. An orogastric tube was placed. The surgical timeout was then performed.  I access to the peritoneum using the 5 mm Optiview technique in the left upper quadrant. This was done without any difficulty. I then insufflated the abdomen to 15 mm mercury pressure. I then placed 2 further trocars in the left mid abdomen under direct  vision without complication. He was noted to have 2 small hernias at the superior aspect of his incision. I spent about 30 minutes lysing the adhesions with the laparoscope. He has a very difficult adhesions to lyse. Over some time I thought that it would be better to do a preperitoneal open repair both to avoid an enterotomy as well as to place the mesh away from his viscera given the amount of adhesions he already has. I elected then to remove the laparoscopic equipment and I made an open incision. I excised his old scar. I irrigated his abdomen and spent about another 40 minutes lysing adhesions to free up the abdominal wall and multiple adhesions between the bowel. There were no enterotomies made. I inspected the bowel several times prior to closing. Once I released all these adhesions I then developed the preperitoneal plane to the ASIS on both sides. I developed this down to the pubic tubercle inferiorly and in the epigastrium superiorly. This was done without difficulty. Then I very easily closed the peritoneum using #1 Novafil sutures. There was one small rent in the peritoneum the close of the 3-0 Vicryl. I then elected to use a 19 x 24 cm piece of mesh. I placed a stay stitch in each cardinal position around the mesh. I then inserted this in the preperitoneal space where it fit perfectly. I then brought the stitches out using the Endo Close device and tied it down. Hemostasis was observed. I then closed my fascia overlying this with #1 interrupted Novafil sutures. This closed very easily. There was no need for any release to  be done. I then obtained hemostasis. I irrigated this. I then used a 28 [place] Blake drain that went into the preperitoneal space and secured this with a 2-0 nylon suture. I then closed the skin and the other incisions with staples. Dressings were then placed. He tolerated this well was extubated and transferred to recovery stable.

## 2013-01-22 NOTE — Transfer of Care (Signed)
Immediate Anesthesia Transfer of Care Note  Patient: Michael [name]  Procedure(s) Performed: Procedure(s): ATTEMPTED LAPAROSCOPIC VENTRAL HERNIA CONVERTED TO OPEN (N/A) INSERTION OF MESH (N/A)  Patient Location: PACU  Anesthesia Type:General  Level of Consciousness: responds to stimulation  Airway & Oxygen Therapy: Patient Spontanous Breathing and Patient connected to face mask oxygen  Post-op Assessment: Report given to PACU RN and Post -op Vital signs reviewed and stable  Post vital signs: Reviewed and stable  Complications: No apparent anesthesia complications

## 2013-01-22 NOTE — Consult Note (Signed)
Indication for Consultation:  Management of ESRD/hemodialysis; anemia, hypertension/volume and secondary hyperparathyroidism  HPI: Michael [name] is a 57 y.o. male ESRD patient, (MWF HD) with past medical history significant for hypertension and hyperlipidemia who presented today for a scheduled repair of a recurrent umbilical hernia resulting from a prior repair and bowl resection in late 2013. A laparoscopic procedure was planned today but was subsequently converted to preperitoneal open procedure due to significant adhesions.  At the time of this encounter, he is resting comfortably with no complaints and his pain is well controlled. He denies HA, dizziness, nausea or vomiting. We are consulted to manage his hemodialysis needs while admitted.   Past Medical History  Diagnosis Date  . Hypertension   . Hyperparathyroidism, secondary renal   . H/O cardiac catheterization   . Hyperlipidemia   . Fatigue   . Chronic kidney disease     Polycystic kidney disease, not on diaylsis yet  . Kidney stones     pased one, One Lazer   . ESRD (end stage renal disease)     left av fistula - not on dialysis yet [facility]   Past Surgical History  Procedure Laterality Date  . Appendectomy    . Cardiac catheterization  04-05-2010    checking for blockage but none-[facility]  . Av fistula placement  12/05/2011    Procedure: ARTERIOVENOUS (AV) FISTULA CREATION;LLEFT ARM  Surgeon: Conrad [place], MD;  Location: [place];  Service: Vascular;  Laterality: Left;  RADIO-CEPHALIC  fistula left arm  . Av fistula placement  01/11/2012    Procedure: ARTERIOVENOUS (AV) FISTULA CREATION;  Surgeon: Conrad [place], MD;  Location: [place];  Service: Vascular;  Laterality: Left;  Creation of left brachial cephalic arteriovenous fistula  . Umbilical hernia repair  03/20/2012    Procedure: HERNIA REPAIR UMBILICAL ADULT;  Surgeon: [name], MD;  Location: [place];  Service: General;  Laterality: N/A;  . Insertion of mesh   03/20/2012    Procedure: INSERTION OF MESH;  UMB Surgeon: [name], MD;  Location: [place];  Service: General;  Laterality: N/A;  . Hernia repair    . Laparotomy  04/02/2012    Procedure: EXPLORATORY LAPAROTOMY;  Surgeon: [name], MD;  Location: [facility] OR;  Service: General;  Laterality: N/A;  Exploratory Laparotomy with resection of small intestine  . Tonsillectomy     Family History  Problem Relation Age of Onset  . Heart disease Mother   . Hyperlipidemia Mother   . Hypertension Mother   . Kidney disease Father   . Stroke Father   . Kidney disease Brother    Social History:  reports that he has never smoked. He has never used smokeless tobacco. He reports that he does not drink alcohol or use illicit drugs. Allergies  Allergen Reactions  . Lipitor [Atorvastatin] Itching    Leg pain   Prior to Admission medications   Medication Sig Start Date End Date Taking? Authorizing Provider  atorvastatin (LIPITOR) 40 MG tablet Take 40 mg by mouth daily.   Yes Historical Provider, MD  metoprolol succinate (TOPROL-XL) 100 MG 24 hr tablet Take 100 mg by mouth daily. Take with or immediately following a meal.   Yes Historical Provider, MD  Multiple Vitamin (MULTIVITAMIN WITH MINERALS) TABS tablet Take 1 tablet by mouth daily.   Yes Historical Provider, MD   Current Facility-Administered Medications  Medication Dose Route Frequency Provider Last Rate Last Dose  . 0.9 %  sodium chloride infusion  Intravenous Continuous [name], MD 10 mL/hr at 01/22/13 1500    . acetaminophen (TYLENOL) tablet 650 mg  650 mg Oral Q6H PRN [name], MD       Or  . acetaminophen (TYLENOL) suppository 650 mg  650 mg Rectal Q6H PRN [name], MD      . diphenhydrAMINE (BENADRYL) injection 12.5 mg  12.5 mg Intravenous Q6H PRN [name], MD       Or  . diphenhydrAMINE (BENADRYL) 12.5 MG/5ML elixir 12.5 mg  12.5 mg Oral Q6H PRN [name], MD      . fentaNYL  (SUBLIMAZE) 0.05 MG/ML injection           . [START ON 01/23/2013] heparin injection 5,000 Units  5,000 Units Subcutaneous Q8H [name], MD      . metoprolol succinate (TOPROL-XL) 24 hr tablet 100 mg  100 mg Oral Daily [name], MD      . morphine 1 MG/ML PCA injection   Intravenous Q4H [name], MD   3 mg at 01/22/13 1618  . morphine 1 MG/ML PCA injection           . naloxone [facility]) injection 0.4 mg  0.4 mg Intravenous PRN [name], MD       And  . sodium chloride 0.9 % injection 9 mL  9 mL Intravenous PRN [name], MD      . ondansetron [facility]) injection 4 mg  4 mg Intravenous Q6H PRN [name], MD       Labs: Basic Metabolic Panel:  Recent Labs Lab 01/22/13 0614  NA 139  K 3.5  GLUCOSE 95    CBC:  Recent Labs Lab 01/22/13 0614  HGB 16.7  HCT 49.0    ROS: 2/10 Abdominal pain . 10 pt ROS asked and answered. All systems negative except as above  Physical Exam: Filed Vitals:   01/22/13 1345 01/22/13 1415 01/22/13 1540 01/22/13 1618  BP: 152/96 159/61 150/101   Pulse: 93 96 99   Temp:  98.3 F (36.8 C) 98.5 F (36.9 C)   TempSrc:   Oral   Resp: 20 21 18 20   SpO2: 96% 95% 94% 94%     General: Well developed, well nourished, in no acute distress. Head: Normocephalic, atraumatic, sclera non-icteric, mucus membranes are moist Neck: Supple. JVD not elevated. Lungs: Clear bilaterally to auscultation without wheezes, rales, or rhonchi. Breathing is unlabored. Heart: RRR with S1 S2. No murmurs, rubs, or gallops appreciated. Abdomen:  Diffusely tender. Abd binder present post-op, + bowel sounds. No obvious abdominal masses. M-S:  Strength and tone appear normal for age. Lower extremities:without edema or ischemic changes, no open wounds  Neuro: Alert and oriented X 3. Moves all extremities spontaneously. Psych:  Responds to questions appropriately with a normal affect. Dialysis Access: LUA AVF + bruit  Dialysis  Orders: MWF @ [facility] 88.5 kg    4hr 80min   2K/2Ca Heparin 8400  LUA AVF   450/A1.5    Hectorol 55mcg    Epogen none   Venofer none Recent Labs: Hgb 16. 8/27:  P 4.2, Ca 9.9, Alb 4.1  Assessment/Plan: 1. Ventral hernia repair- Lap converted to open  9/23, Dr. Donne Hazel. On PCA for analgesia. 2. ESRD -  MWF @ [facility],  K+ 3.5, HD first shift with added K+ bath.  3. Hypertension/volume  - SBPs 150s on Toprol- xl 100mg , On KVO IVF. Up about 3 kg by wgts. Try for UF to edw with HD tomorrow 4.  Anemia  - Hgb 16.4,  No ESAs or IV Fe 5. Metabolic bone disease -  Ca and Phos controlled op. Continue Hectorol 37mcg 6. Nutrition - Last Alb 4.1  On clears. Renal diet when appropriate. Multivitamin 7. Dispo - Pt states he may go home tomorrow.  HD first shift.  Sonnie Alamo [facility] Beeper [phone]  01/22/2013, 4:38 PM   Patient seen and examined.  Agree with assessment and plan as above.  ESRD pt with hx of polycystic kidney disease started on HD about 8 mos ago.  Underwent open repair of incisional hernia (from prior surgery last yr), also did adhesiolysis.  Some of this is preparation for renal transplant according to patient.  He is stable post op, no volume or electrolyte issues.  Plan HD tomorrow. Will follow.   Kelly Splinter  MD Pager [phone]    Cell  [phone] 01/22/2013, 6:06 PM

## 2013-01-23 LAB — RENAL FUNCTION PANEL
Albumin: 3.6 g/dL (ref 3.5–5.2)
BUN: 50 mg/dL — ABNORMAL HIGH (ref 6–23)
CO2: 24 mEq/L (ref 19–32)
Calcium: 9.6 mg/dL (ref 8.4–10.5)
Chloride: 95 mEq/L — ABNORMAL LOW (ref 96–112)
Creatinine, Ser: 7.13 mg/dL — ABNORMAL HIGH (ref 0.50–1.35)
GFR calc Af Amer: 9 mL/min — ABNORMAL LOW (ref >90)
GFR calc non Af Amer: 8 mL/min — ABNORMAL LOW (ref >90)
Glucose, Bld: 126 mg/dL — ABNORMAL HIGH (ref 70–99)
Phosphorus: 4.2 mg/dL (ref 2.3–4.6)
Potassium: 4.7 mEq/L (ref 3.5–5.1)
Sodium: 134 mEq/L — ABNORMAL LOW (ref 135–145)

## 2013-01-23 LAB — CBC
HCT: 45.7 % (ref 39.0–52.0)
Hemoglobin: 15.4 g/dL (ref 13.0–17.0)
MCH: 29.1 pg (ref 26.0–34.0)
MCHC: 33.7 g/dL (ref 30.0–36.0)
MCV: 86.2 fL (ref 78.0–100.0)
Platelets: 191 10*3/uL (ref 150–400)
RBC: 5.3 MIL/uL (ref 4.22–5.81)
RDW: 14.1 % (ref 11.5–15.5)
WBC: 11.7 10*3/uL — ABNORMAL HIGH (ref 4.0–10.5)

## 2013-01-23 LAB — BASIC METABOLIC PANEL
BUN: 49 mg/dL — ABNORMAL HIGH (ref 6–23)
CO2: 24 mEq/L (ref 19–32)
Calcium: 9.5 mg/dL (ref 8.4–10.5)
Chloride: 94 mEq/L — ABNORMAL LOW (ref 96–112)
Creatinine, Ser: 6.94 mg/dL — ABNORMAL HIGH (ref 0.50–1.35)
GFR calc Af Amer: 9 mL/min — ABNORMAL LOW (ref >90)
GFR calc non Af Amer: 8 mL/min — ABNORMAL LOW (ref >90)
Glucose, Bld: 108 mg/dL — ABNORMAL HIGH (ref 70–99)
Potassium: 5 mEq/L (ref 3.5–5.1)
Sodium: 133 mEq/L — ABNORMAL LOW (ref 135–145)

## 2013-01-23 MED ORDER — DOXERCALCIFEROL 4 MCG/2ML IV SOLN
INTRAVENOUS | Status: AC
  Filled 2013-01-23: qty 2

## 2013-01-23 MED ORDER — METOPROLOL SUCCINATE ER 100 MG PO TB24
100.0000 mg | ORAL_TABLET | Freq: Every day | ORAL | Status: DC
  Administered 2013-01-23 – 2013-01-26 (×3): 100 mg via ORAL
  Filled 2013-01-23 (×3): qty 1

## 2013-01-23 NOTE — Progress Notes (Signed)
1 Day Post-Op  Subjective: Attempted hd this am but avg apparently unable to be accessed, await nephrology, pain otherwise controlled, no flatus, tol clears, no n/v, has been sitting at side of bed  Objective: Vital signs in last 24 hours: Temp:  [97.8 F (36.6 C)-99.1 F (37.3 C)] 97.8 F (36.6 C) (09/24 0518) Pulse Rate:  [78-110] 97 (09/24 0518) Resp:  [18-24] 21 (09/24 0518) BP: (121-159)/(61-101) 137/90 mmHg (09/24 0518) SpO2:  [93 %-98 %] 94 % (09/24 0518) FiO2 (%):  [94 %] 94 % (09/23 1942) Weight:  [211 lb 1.6 oz (95.754 kg)] 211 lb 1.6 oz (95.754 kg) (09/24 0621) Last BM Date: 01/22/13  Intake/Output from previous day: 09/23 0701 - 09/24 0700 In: [id number] [P.O.:240; I.V.:1135] Out: 415 [Urine:200; Drains:215] Intake/Output this shift:    General appearance: no distress Resp: clear to auscultation bilaterally Cardio: regular rate and rhythm GI: no real bs, drain with serosang fluid, approp tender  Lab Results:   Recent Labs  01/22/13 0614 01/22/13 1612  WBC  --  14.8*  HGB 16.7 16.4  HCT 49.0 46.7  PLT  --  199   BMET  Recent Labs  01/22/13 0614 01/22/13 1612  NA 139  --   K 3.5  --   GLUCOSE 95  --   CREATININE  --  6.05*     Assessment/Plan: POD 1 open retrorectus repair of incisional hernia 1. Neuro- continue pca today 2. Cv/pulm- continue beta blocker, pulm toilet 3. Gi- await bowel function, will continue clear liquids 4. Renal-await nephrology to see today 5. Sq heparin, scds 6. dispo- when has bowel function and tol reg diet  [facility] 01/23/2013

## 2013-01-23 NOTE — Progress Notes (Deleted)
Pt came in for HD at 6:50.Pt's AVG absent thrill and bruit.Try to cannulated 15g needle on venous side no flashing and had black bleed in needle.Pt's AVG clotted.Charge nurse Robert Bellow aware and Dr Jonnie Finner notified. Pt sent back to the room and Pt in stable condition.

## 2013-01-23 NOTE — Procedures (Signed)
I was present at this dialysis session. I have reviewed the session itself and made appropriate changes.   Kelly Splinter  MD Pager [phone]    Cell  [phone] 01/23/2013, 9:05 AM

## 2013-01-24 ENCOUNTER — Encounter (HOSPITAL_COMMUNITY): Payer: Self-pay | Admitting: General Surgery

## 2013-01-24 MED ORDER — MORPHINE SULFATE 2 MG/ML IJ SOLN: 2.0000 mg | INTRAMUSCULAR | Status: DC | PRN

## 2013-01-24 NOTE — Progress Notes (Signed)
  [facility] Progress Note  Subjective:   Feeling better. Ambulating without difficulty. Pain stated at controlled. No gas or BM yet.   Objective Filed Vitals:   01/24/13 0635 01/24/13 0800 01/24/13 0957 01/24/13 1117  BP: 140/98 130/81 126/77 141/82  Pulse: 100 96 90 96  Temp: 97.9 F (36.6 C) 98.5 F (36.9 C)    TempSrc: Oral Oral    Resp: 21 20    Height:      Weight:      SpO2: 94% 95%     Physical Exam General: WD/WN, pleasant, NAD Heart: RRR, no murmur or rub appreciated Lungs: CTAB, no wheezes, rales or rhonchi noted Abdomen: Soft, Abd binder in place, + BS Extremities: No LE edema Dialysis Access: LUA AVF + bruit  Dialysis Orders: MWF @ [facility]  88.5 kg 4hr 36min 2K/2Ca Heparin 8400 LUA AVF 450/A1.5  Hectorol 84mcg Epogen none Venofer none  Labs: Hgb 16. 8/27: P 4.2, Ca 9.9, Alb 4.1   Assessment/Plan: 1. Incisional abdominal wall hernia repair (9/23 / Dr. Donne Hazel) - stable post-op, PCA to be d/c'd. Awaiting return of bowel function 2. ESRD, HD MWF - K 4.7, HD tomorrow, tight heparin 3. HTN/volume- SBPs 120s-140s on Toprol. Up ~2kg. UF tomorrow 2-3kg as tolerated 4. Anemia - Hgb high, no ESAs or IV Fe 5. Metabolic bone disease - Ca and Phos controlled op. Continue Hectorol 43mcg 6. Nutrition - Alb 3.6 On clears. Renal diet when appropriate. Multivitamin 7. Dispo - Home once tolerating regular diet and with return of bowel fx per surgery. Ok from renal standpoint.  Collene Leyden. Rhodia Albright [facility] Pager [phone] 01/24/2013,12:35 PM  LOS: 2 days   Patient seen and examined.  Agree with assessment and plan as above. Kelly Splinter  MD Pager [phone]    Cell  [phone] 01/24/2013, 2:47 PM    Additional Objective Labs: Basic Metabolic Panel:  Recent Labs Lab 01/22/13 0614 01/22/13 1612 01/23/13 0710 01/23/13 0915  NA 139  --  133* 134*  K 3.5  --  5.0 4.7  CL  --   --  94* 95*  CO2  --   --  24 24  GLUCOSE 95  --  108*  126*  BUN  --   --  49* 50*  CREATININE  --  6.05* 6.94* 7.13*  CALCIUM  --   --  9.5 9.6  PHOS  --   --   --  4.2   Liver Function Tests:  Recent Labs Lab 01/23/13 0915  ALBUMIN 3.6   CBC:  Recent Labs Lab 01/22/13 0614 01/22/13 1612 01/23/13 0710  WBC  --  14.8* 11.7*  HGB 16.7 16.4 15.4  HCT 49.0 46.7 45.7  MCV  --  85.1 86.2  PLT  --  199 191   Medications:   . doxercalciferol  2 mcg Intravenous Q M,W,F-HD  . heparin  5,000 Units Subcutaneous Q8H  . metoprolol succinate  100 mg Oral Daily  . multivitamin  1 tablet Oral QHS

## 2013-01-24 NOTE — Progress Notes (Signed)
2 Days Post-Op  Subjective: Pain controlled, no flatus/bm yet, no n/v, tol clears, ambulating, hd yesterday  Objective: Vital signs in last 24 hours: Temp:  [96.7 F (35.9 C)-98.5 F (36.9 C)] 97.9 F (36.6 C) (09/25 0635) Pulse Rate:  [93-112] 100 (09/25 0635) Resp:  [15-21] 21 (09/25 0635) BP: (112-142)/(71-98) 140/98 mmHg (09/25 0635) SpO2:  [90 %-97 %] 94 % (09/25 0635) Weight:  [199 lb 11.8 oz (90.6 kg)-206 lb 2.1 oz (93.5 kg)] 199 lb 11.8 oz (90.6 kg) (09/24 1332) Last BM Date: 01/22/13  Intake/Output from previous day: 09/24 0701 - 09/25 0700 In: 94.2 [I.V.:94.2] Out: 3460 [Urine:350; Drains:110] Intake/Output this shift: Total I/O In: -  Out: 50 [Drains:50]  General appearance: no distress Resp: diminished breath sounds bibasilar Cardio: regular rate and rhythm GI: approp tender, mild distended, few bs, wound clean without infection, drain with serous fluid  Lab Results:   Recent Labs  01/22/13 1612 01/23/13 0710  WBC 14.8* 11.7*  HGB 16.4 15.4  HCT 46.7 45.7  PLT 199 191   BMET  Recent Labs  01/23/13 0710 01/23/13 0915  NA 133* 134*  K 5.0 4.7  CL 94* 95*  CO2 24 24  GLUCOSE 108* 126*  BUN 49* 50*  CREATININE 6.94* 7.13*  CALCIUM 9.5 9.6    Anti-infectives: Anti-infectives   Start     Dose/Rate Route Frequency Ordered Stop   01/22/13 0600  ceFAZolin (ANCEF) IVPB 2 g/50 mL premix     2 g 100 mL/hr over 30 Minutes Intravenous On call to O.R. 01/21/13 1428 01/22/13 0728      Assessment/Plan: POD 2 open vh repair 1. Will dc pca and give prn morphine, he is not using much  2. pulm toilet, continue beta blocker 3. Continue clears, await more bowel function 4. Appreciate renal assistance, hd tomorrow 5. Heparin, scds 6. dispo- dc when return of bowel function and tol reg renal diet  [facility] 01/24/2013

## 2013-01-25 LAB — CBC
HCT: 41.5 % (ref 39.0–52.0)
Hemoglobin: 14.6 g/dL (ref 13.0–17.0)
MCH: 29.6 pg (ref 26.0–34.0)
MCHC: 35.2 g/dL (ref 30.0–36.0)
MCV: 84.2 fL (ref 78.0–100.0)
Platelets: 180 10*3/uL (ref 150–400)
RBC: 4.93 MIL/uL (ref 4.22–5.81)
RDW: 13.6 % (ref 11.5–15.5)
WBC: 6.8 10*3/uL (ref 4.0–10.5)

## 2013-01-25 LAB — BASIC METABOLIC PANEL
BUN: 49 mg/dL — ABNORMAL HIGH (ref 6–23)
CO2: 24 mEq/L (ref 19–32)
Calcium: 9.5 mg/dL (ref 8.4–10.5)
Chloride: 92 mEq/L — ABNORMAL LOW (ref 96–112)
Creatinine, Ser: 6.58 mg/dL — ABNORMAL HIGH (ref 0.50–1.35)
GFR calc Af Amer: 10 mL/min — ABNORMAL LOW (ref >90)
GFR calc non Af Amer: 8 mL/min — ABNORMAL LOW (ref >90)
Glucose, Bld: 90 mg/dL (ref 70–99)
Potassium: 3.7 mEq/L (ref 3.5–5.1)
Sodium: 132 mEq/L — ABNORMAL LOW (ref 135–145)

## 2013-01-25 MED ORDER — OXYCODONE-ACETAMINOPHEN 5-325 MG PO TABS
1.0000 | ORAL_TABLET | ORAL | Status: DC | PRN
  Administered 2013-01-25: 2 via ORAL

## 2013-01-25 MED ORDER — DOXERCALCIFEROL 4 MCG/2ML IV SOLN
INTRAVENOUS | Status: AC
  Administered 2013-01-25: 2 ug via INTRAVENOUS
  Filled 2013-01-25: qty 2

## 2013-01-25 MED ORDER — DOCUSATE SODIUM 100 MG PO CAPS
100.0000 mg | ORAL_CAPSULE | Freq: Two times a day (BID) | ORAL | Status: DC
  Administered 2013-01-25 – 2013-01-26 (×2): 100 mg via ORAL
  Filled 2013-01-25: qty 1

## 2013-01-25 MED ORDER — OXYCODONE-ACETAMINOPHEN 5-325 MG PO TABS
ORAL_TABLET | ORAL | Status: AC
  Administered 2013-01-25: 2 via ORAL
  Filled 2013-01-25: qty 2

## 2013-01-25 MED ORDER — HEPARIN SODIUM (PORCINE) 1000 UNIT/ML IJ SOLN
1800.0000 [IU] | Freq: Once | INTRAMUSCULAR | Status: AC
  Administered 2013-01-25: 1800 [IU] via INTRAVENOUS

## 2013-01-25 NOTE — Progress Notes (Signed)
Renal Service Daily Progress Note [facility]  Subjective: To take liquids today, to advance diet today  Physical Exam:  Blood pressure 129/83, pulse 76, temperature 98.3 F (36.8 C), temperature source Oral, resp. rate 18, height 5\' 6"  (1.676 m), weight 88.6 kg (195 lb 5.2 oz), SpO2 97.00%. Physical Exam General: WD/WN, pleasant, NAD Heart: RRR, no murmur or rub appreciated Lungs: CTAB, no wheezes, rales or rhonchi noted Abdomen: Soft, Abd binder in place, + BS, drain in place Extremities: No LE edema Dialysis Access: LUA AVF + bruit  Dialysis Orders: MWF @ [facility]  88.5 kg 4hr 20min 2K/2Ca Heparin 8400 LUA AVF 450/A1.5  Hectorol 4mcg Epogen none Venofer none  Labs: Hgb 16. 8/27: P 4.2, Ca 9.9, Alb 4.1   Assessment/Plan: 1. Incisional abdominal wall hernia repair (9/23 / Dr. Donne Hazel)- advancing diet today, possible d/c tomorrow 2. ESRD, HD MWF, tight heparin 3. HTN/volume- at dry wt, UF 1 kg today 4. Anemia - Hgb high, no ESAs or IV Fe 5. Metabolic bone disease - Ca and Phos controlled op. Continue Hectorol 101mcg 6. Nutrition - Alb 3.6 On clears. Renal diet when appropriate. Multivitamin 7. Dispo - d/c per primary  Kelly Splinter  MD Pager [phone]    Cell  [phone] 01/25/2013, 10:16 AM    Recent Labs Lab 01/23/13 0710 01/23/13 0915 01/25/13 0500  NA 133* 134* 132*  K 5.0 4.7 3.7  CL 94* 95* 92*  CO2 24 24 24   GLUCOSE 108* 126* 90  BUN 49* 50* 49*  CREATININE 6.94* 7.13* 6.58*  CALCIUM 9.5 9.6 9.5  PHOS  --  4.2  --     Recent Labs Lab 01/23/13 0915  ALBUMIN 3.6    Recent Labs Lab 01/22/13 1612 01/23/13 0710 01/25/13 0500  WBC 14.8* 11.7* 6.8  HGB 16.4 15.4 14.6  HCT 46.7 45.7 41.5  MCV 85.1 86.2 84.2  PLT 199 191 180   . docusate sodium  100 mg Oral BID  . doxercalciferol  2 mcg Intravenous Q M,W,F-HD  . heparin  5,000 Units Subcutaneous Q8H  . metoprolol succinate  100 mg Oral Daily  . multivitamin  1 tablet Oral QHS      acetaminophen, acetaminophen, diphenhydrAMINE, diphenhydrAMINE, morphine injection, naloxone, ondansetron (ZOFRAN) IV, oxyCODONE-acetaminophen, sodium chloride

## 2013-01-25 NOTE — Progress Notes (Signed)
3 Days Post-Op  Subjective: Pain controlled, passing flatus, tolerating clears, no n/v  Objective: Vital signs in last 24 hours: Temp:  [97.9 F (36.6 C)-98.6 F (37 C)] 98.2 F (36.8 C) (09/26 0635) Pulse Rate:  [89-101] 101 (09/26 0635) Resp:  [18-20] 18 (09/26 0635) BP: (126-152)/(77-86) 152/83 mmHg (09/26 0635) SpO2:  [93 %-100 %] 100 % (09/26 0635) Weight:  [197 lb 14.4 oz (89.767 kg)] 197 lb 14.4 oz (89.767 kg) (09/26 0635) Last BM Date:  (PTA)  Intake/Output from previous day: 09/25 0701 - 09/26 0700 In: 800 [P.O.:800] Out: 860 [Urine:850; Drains:10] Intake/Output this shift: Total I/O In: 200 [P.O.:200] Out: 750 [Urine:750]  General appearance: no distress Resp: clear to auscultation bilaterally Cardio: regular rate and rhythm GI: wound without infection, jp with serous fluid, approp tender, softer, bs present  Lab Results:   Recent Labs  01/22/13 1612 01/23/13 0710  WBC 14.8* 11.7*  HGB 16.4 15.4  HCT 46.7 45.7  PLT 199 191   BMET  Recent Labs  01/23/13 0710 01/23/13 0915  NA 133* 134*  K 5.0 4.7  CL 94* 95*  CO2 24 24  GLUCOSE 108* 126*  BUN 49* 50*  CREATININE 6.94* 7.13*  CALCIUM 9.5 9.6    Assessment/Plan: POD 3 open retrorectus vh repair 1. Po pain meds 2. pulm toilet, continue beta blocker 3. Hd today 4. Full liquids, advance as tolerated 5. If does well possibly dc tomorrow with drain in place and I will call with follow up based on drain output 6. Heparin, scds  [facility] 01/25/2013

## 2013-01-25 NOTE — Procedures (Signed)
I was present at this dialysis session. I have reviewed the session itself and made appropriate changes.   Kelly Splinter  MD Pager [phone]    Cell  [phone] 01/25/2013, 10:23 AM

## 2013-01-26 MED ORDER — OXYCODONE-ACETAMINOPHEN 5-325 MG PO TABS: 1.0000 | ORAL_TABLET | ORAL | Status: DC | PRN

## 2013-01-26 NOTE — Progress Notes (Signed)
Discharge instructions gone over with patient. Dressing changed. Home medications gone over. Prescription given. Diet, activity and incisional care gone over. Follow up appointment to be made. Signs and symptoms of infection and or worsening condition gone over. Patient demonstrates understanding of emptying jp drain. Drain care gone over. Patient verbalized understanding of instructions.

## 2013-01-26 NOTE — Progress Notes (Signed)
4 Days Post-Op   Assessment: s/p Procedure(s): ATTEMPTED LAPAROSCOPIC VENTRAL HERNIA CONVERTED TO OPEN INSERTION OF MESH Patient Active Problem List   Diagnosis Date Noted  . End stage renal disease 01/06/2012  . Chronic kidney disease (CKD), stage IV (severe) 11/25/2011    Ready to go home today  Plan: Discharge  Subjective: Feels good, not much pain, tolerating diet, had a BM, ambulating in halls, wants to go home  Objective: Vital signs in last 24 hours: Temp:  [97.9 F (36.6 C)-98.5 F (36.9 C)] 97.9 F (36.6 C) (09/27 0607) Pulse Rate:  [75-104] 89 (09/27 0607) Resp:  [16-18] 18 (09/27 0607) BP: (118-154)/(71-96) 134/71 mmHg (09/27 0607) SpO2:  [97 %-98 %] 97 % (09/27 0607) Weight:  [193 lb 2 oz (87.6 kg)] 193 lb 2 oz (87.6 kg) (09/26 1315)   Intake/Output from previous day: 09/26 0701 - 09/27 0700 In: 240 [P.O.:240] Out: 1050 [Urine:100; Drains:50]  General appearance: alert, cooperative and no distress Resp: clear to auscultation bilaterally GI: soft, non-tender; bowel sounds normal; no masses,  no organomegaly  Incision: healing well, no significant drainage, Drain thin  Lab Results:   Recent Labs  01/25/13 0500  WBC 6.8  HGB 14.6  HCT 41.5  PLT 180   BMET  Recent Labs  01/23/13 0915 01/25/13 0500  NA 134* 132*  K 4.7 3.7  CL 95* 92*  CO2 24 24  GLUCOSE 126* 90  BUN 50* 49*  CREATININE 7.13* 6.58*  CALCIUM 9.6 9.5    MEDS, Scheduled . docusate sodium  100 mg Oral BID  . doxercalciferol  2 mcg Intravenous Q M,W,F-HD  . heparin  5,000 Units Subcutaneous Q8H  . metoprolol succinate  100 mg Oral Daily  . multivitamin  1 tablet Oral QHS    Studies/Results: No results found.    LOS: 4 days     Haywood Lasso, MD, [facility], [place] [phone]   01/26/2013 8:48 AM

## 2013-01-26 NOTE — Progress Notes (Signed)
[facility] Progress Note  Subjective:   No pain, BM yesterday, tolerating solids. Anxious to go home  Objective Filed Vitals:   01/25/13 1230 01/25/13 1315 01/25/13 2204 01/26/13 0607  BP: 143/85 153/91 125/90 134/71  Pulse: 97 104 98 89  Temp:  98.2 F (36.8 C) 98.5 F (36.9 C) 97.9 F (36.6 C)  TempSrc:  Oral Oral Oral  Resp:  16 16 18   Height:      Weight:  87.6 kg (193 lb 2 oz)    SpO2:  98% 98% 97%   Physical Exam General: Pleasant, cooperative, NAD Heart: RRR Lungs: CTAB, no wheezes, rales or rhonchi Abdomen: Soft, Abd binder in place, nl BS Extremities: No LE edema Dialysis Access: LAU AVF + bruit  Dialysis Orders: MWF @ [facility]  88.5 kg 4hr 30min 2K/2Ca Heparin 8400 LUA AVF 450/A1.5  Hectorol 16mcg Epogen none Venofer none  Labs: Hgb 16. 8/27: P 4.2, Ca 9.9, Alb 4.1    Assessment/Plan: 1. Incisional abdominal wall hernia repair (9/23 / Dr. Donne Hazel)- tolerating diet, bowel fx returning. Home today per surgery. 2. ESRD, HD MWF, 3. HTN/volume- BP controlled. Slightly under EDW here. Net UF 900cc on 9/26 4. Anemia - Hgb high, no ESAs or IV Fe 5. Metabolic bone disease - Ca and Phos controlled op. Continue Hectorol 2mcg 6. Nutrition - Alb 3.6 Renal diet, Multivitamin 7. Dispo - Home today. No changes to op HD orders  Collene Leyden. Rhodia Albright [facility] Pager [phone] 01/26/2013,9:32 AM  LOS: 4 days   Patient seen and examined.  Agree with assessment and plan as above. Kelly Splinter  MD Pager [phone]    Cell  [phone] 01/26/2013, 11:01 AM     Additional Objective Labs: Basic Metabolic Panel:  Recent Labs Lab 01/23/13 0710 01/23/13 0915 01/25/13 0500  NA 133* 134* 132*  K 5.0 4.7 3.7  CL 94* 95* 92*  CO2 24 24 24   GLUCOSE 108* 126* 90  BUN 49* 50* 49*  CREATININE 6.94* 7.13* 6.58*  CALCIUM 9.5 9.6 9.5  PHOS  --  4.2  --    Liver Function Tests:  Recent Labs Lab 01/23/13 0915  ALBUMIN 3.6    CBC:  Recent Labs Lab 01/22/13 1612 01/23/13 0710 01/25/13 0500  WBC 14.8* 11.7* 6.8  HGB 16.4 15.4 14.6  HCT 46.7 45.7 41.5  MCV 85.1 86.2 84.2  PLT 199 191 180   Studies/Results: No results found.  Medications:   . docusate sodium  100 mg Oral BID  . doxercalciferol  2 mcg Intravenous Q M,W,F-HD  . heparin  5,000 Units Subcutaneous Q8H  . metoprolol succinate  100 mg Oral Daily  . multivitamin  1 tablet Oral QHS

## 2013-02-04 ENCOUNTER — Encounter (INDEPENDENT_AMBULATORY_CARE_PROVIDER_SITE_OTHER): Payer: BC Managed Care – PPO | Admitting: General Surgery

## 2013-02-05 ENCOUNTER — Ambulatory Visit (INDEPENDENT_AMBULATORY_CARE_PROVIDER_SITE_OTHER): Payer: BC Managed Care – PPO | Admitting: General Surgery

## 2013-02-05 ENCOUNTER — Encounter (INDEPENDENT_AMBULATORY_CARE_PROVIDER_SITE_OTHER): Payer: Self-pay | Admitting: General Surgery

## 2013-02-05 VITALS — BP 152/82 | HR 80 | Resp 18 | Ht 66.0 in | Wt 196.0 lb

## 2013-02-05 DIAGNOSIS — Z09 Encounter for follow-up examination after completed treatment for conditions other than malignant neoplasm: Secondary | ICD-10-CM

## 2013-02-05 NOTE — Progress Notes (Signed)
Subjective:     Patient ID: Michael [name], male   DOB: 12-03-1955, 57 y.o.   [name]: [id number]  HPI This is a 57 year old male that I fixed a incisional hernia on a couple of weeks ago. I ended up fixing this in an open fashion with a preperitoneal repair. He has done well since then. He is having bowel movements. He is tolerating his diet. His drain is putting out not much fluid as well. He is doing his HD and is tolerating that well.   Review of Systems     Objective:   Physical Exam Healing midline incision with staples in place, laparoscopic incisions on the left side are healing without infection, drain was some serosanguineous fluid    Assessment:     Status post open ventral hernia repair, preperitoneal     Plan:     I removed the staples and place Steri-Strips today. I removed his drain. I told her this will likely leak for the next several days. I told him he could begin taking a shower. We'll keep his activity as is for right now I will have him see me in about 3 weeks again.

## 2013-02-13 NOTE — Discharge Summary (Signed)
Physician Discharge Summary  Patient ID: Michael [name] [name]: [id number] DOB/AGE: Apr 04, 1956 57 y.o.  Admit date: 01/22/2013 Discharge date: 02/13/2013  Admission Diagnoses: Incisional hernia ESRD HTN  Discharge Diagnoses:  Active Problems:   * No active hospital problems. *   Discharged Condition: good  Hospital Course: 58 yom with incisional hernia and esrd.  He was admitted and underwent preperitoneal ventral hernia repair with mesh that was uneventful.  I tried to do this with laparoscope but decided to open due to his adhesions.  He recovered well.  He had return of bowel function and was tolerating a regular diet.  His pain was controlled and he was discharged home with drains in place. He was dialyzed as inpatient while admitted.  Consults: nephrology  Significant Diagnostic Studies: none  Treatments: surgery: open vh repair with mesh   Disposition: 01-Home or Self Care  Discharge Orders   Future Appointments Provider Department Dept Phone   02/26/2013 11:30 AM [name], MD [facility], [place] [phone]   Future Orders Complete By Expires   Diet renal 60/[id number]  As directed    Discharge instructions  As directed    Comments:     [facility], [place] [phone]  OPEN ABDOMINAL SURGERY: POST OP INSTRUCTIONS  Always review your discharge instruction sheet given to you by the facility where your surgery was performed.  IF YOU HAVE DISABILITY OR FAMILY LEAVE FORMS, YOU MUST BRING THEM TO THE OFFICE FOR PROCESSING.  PLEASE DO NOT GIVE THEM TO YOUR DOCTOR.  A prescription for pain medication may be given to you upon discharge.  Take your pain medication as prescribed, if needed.  If narcotic pain medicine is not needed, then you may take acetaminophen (Tylenol) or ibuprofen (Advil) as needed. Take your usually prescribed medications unless otherwise directed. If you need a refill on your pain medication, please contact your  pharmacy. They will contact our office to request authorization.  Prescriptions will not be filled after 5pm or on week-ends. You should follow a light diet the first few days after arrival home, such as soup and crackers, pudding, etc.unless your doctor has advised otherwise. A high-fiber, low fat diet can be resumed as tolerated.   Be sure to include lots of fluids daily. Most patients will experience some swelling and bruising on the chest and neck area.  Ice packs will help.  Swelling and bruising can take several days to resolve Most patients will experience some swelling and bruising in the area of the incision. Ice pack will help. Swelling and bruising can take several days to resolve..  It is common to experience some constipation if taking pain medication after surgery.  Increasing fluid intake and taking a stool softener will usually help or prevent this problem from occurring.  A mild laxative (Milk of Magnesia or Miralax) should be taken according to package directions if there are no bowel movements after 48 hours.  You may have steri-strips (small skin tapes) in place directly over the incision.  These strips should be left on the skin for 7-10 days.  If your surgeon used skin glue on the incision, you may shower in 24 hours.  The glue will flake off over the next 2-3 weeks.  Any sutures or staples will be removed at the office during your follow-up visit. You may find that a light gauze bandage over your incision may keep your staples from being rubbed or pulled. You may shower and replace  the bandage daily. ACTIVITIES:  You may resume regular (light) daily activities beginning the next day-such as daily self-care, walking, climbing stairs-gradually increasing activities as tolerated.  You may have sexual intercourse when it is comfortable.  Refrain from any heavy lifting or straining until approved by your doctor. You may drive when you no longer are taking prescription pain medication, you can  comfortably wear a seatbelt, and you can safely maneuver your car and apply brakes Return to Work: ___________________________________ Dennis Bast should see your doctor in the office for a follow-up appointment approximately two weeks after your surgery.  Make sure that you call for this appointment within a day or two after you arrive home to insure a convenient appointment time. OTHER INSTRUCTIONS:  _____________________________________________________________ _____________________________________________________________  WHEN TO CALL YOUR DOCTOR: Fever over 101.0 Inability to urinate Nausea and/or vomiting Extreme swelling or bruising Continued bleeding from incision. Increased pain, redness, or drainage from the incision. Difficulty swallowing or breathing Muscle cramping or spasms. Numbness or tingling in hands or feet or around lips.  The clinic staff is available to answer your questions during regular business hours.  Please don't hesitate to call and ask to speak to one of the nurses if you have concerns.  For further questions, please visit [email or web address]   Discharge wound care:  As directed    Comments:     Empty drain twice daily and record the amount. Bring that record to your office visit   Increase activity slowly  As directed    May shower / Bathe  As directed    Comments:     Starting tomorrow       Medication List         atorvastatin 40 MG tablet  Commonly known as:  LIPITOR  Take 40 mg by mouth daily.     metoprolol succinate 100 MG 24 hr tablet  Commonly known as:  TOPROL-XL  Take 100 mg by mouth daily. Take with or immediately following a meal.     multivitamin with minerals Tabs tablet  Take 1 tablet by mouth daily.     oxyCODONE-acetaminophen 5-325 MG per tablet  Commonly known as:  PERCOCET/ROXICET  Take 1-2 tablets by mouth every 4 (four) hours as needed.           Follow-up Information   Follow up with [facility], MD In  1 week.   Specialty:  General Surgery   Contact information:   [address] [place] [address] [phone]       Signed: [name] 02/13/2013, 8:07 AM

## 2013-02-26 ENCOUNTER — Encounter (INDEPENDENT_AMBULATORY_CARE_PROVIDER_SITE_OTHER): Payer: Self-pay | Admitting: General Surgery

## 2013-02-26 ENCOUNTER — Ambulatory Visit (INDEPENDENT_AMBULATORY_CARE_PROVIDER_SITE_OTHER): Payer: BC Managed Care – PPO | Admitting: General Surgery

## 2013-02-26 VITALS — BP 128/78 | HR 64 | Temp 97.0°F | Resp 14 | Ht 66.0 in | Wt 195.4 lb

## 2013-02-26 DIAGNOSIS — Z09 Encounter for follow-up examination after completed treatment for conditions other than malignant neoplasm: Secondary | ICD-10-CM

## 2013-02-26 NOTE — Progress Notes (Signed)
Subjective:     Patient ID: Michael [name], male   DOB: 10-14-1955, 57 y.o.   [name]: [id number]  HPI This is a 57 year old male who had a recurrent incisional hernia or. About 5 weeks ago to the operating room and an open preperitoneal repair. His drains have been removed. He returns today doing well without any complaints. He is having normal bowel movements and is eating well.  Review of Systems     Objective:   Physical Exam Healed incision without infection,some asymmetry of abdominal wall right greater than left, no hernia    Assessment:     S/p vh repair     Plan:     He is doing well. I think the asymmetry will be better just with some time. He is going to come back and see me in 3 months. He is going to increase activity as tolerated.

## 2013-05-13 ENCOUNTER — Inpatient Hospital Stay (HOSPITAL_COMMUNITY)
Admission: EM | Admit: 2013-05-13 | Discharge: 2013-05-19 | DRG: 286 | Disposition: A | Discharge status: Home / Self Care | Payer: BC Managed Care – PPO | Attending: Cardiology | Admitting: Cardiology

## 2013-05-13 ENCOUNTER — Encounter (HOSPITAL_COMMUNITY): Payer: Self-pay | Admitting: Emergency Medicine

## 2013-05-13 ENCOUNTER — Emergency Department (HOSPITAL_COMMUNITY): Payer: BC Managed Care – PPO

## 2013-05-13 DIAGNOSIS — N184 Chronic kidney disease, stage 4 (severe): Secondary | ICD-10-CM

## 2013-05-13 DIAGNOSIS — Z7682 Awaiting organ transplant status: Secondary | ICD-10-CM

## 2013-05-13 DIAGNOSIS — I428 Other cardiomyopathies: Secondary | ICD-10-CM | POA: Diagnosis present

## 2013-05-13 DIAGNOSIS — M899 Disorder of bone, unspecified: Secondary | ICD-10-CM | POA: Diagnosis present

## 2013-05-13 DIAGNOSIS — E785 Hyperlipidemia, unspecified: Secondary | ICD-10-CM | POA: Diagnosis present

## 2013-05-13 DIAGNOSIS — I359 Nonrheumatic aortic valve disorder, unspecified: Secondary | ICD-10-CM

## 2013-05-13 DIAGNOSIS — I120 Hypertensive chronic kidney disease with stage 5 chronic kidney disease or end stage renal disease: Secondary | ICD-10-CM | POA: Diagnosis present

## 2013-05-13 DIAGNOSIS — N186 End stage renal disease: Secondary | ICD-10-CM

## 2013-05-13 DIAGNOSIS — I493 Ventricular premature depolarization: Secondary | ICD-10-CM

## 2013-05-13 DIAGNOSIS — N2581 Secondary hyperparathyroidism of renal origin: Secondary | ICD-10-CM | POA: Diagnosis present

## 2013-05-13 DIAGNOSIS — D649 Anemia, unspecified: Secondary | ICD-10-CM | POA: Diagnosis present

## 2013-05-13 DIAGNOSIS — Z823 Family history of stroke: Secondary | ICD-10-CM

## 2013-05-13 DIAGNOSIS — Z992 Dependence on renal dialysis: Secondary | ICD-10-CM

## 2013-05-13 DIAGNOSIS — I4729 Other ventricular tachycardia: Principal | ICD-10-CM | POA: Diagnosis present

## 2013-05-13 DIAGNOSIS — I5021 Acute systolic (congestive) heart failure: Secondary | ICD-10-CM

## 2013-05-13 DIAGNOSIS — I472 Ventricular tachycardia, unspecified: Principal | ICD-10-CM | POA: Diagnosis present

## 2013-05-13 DIAGNOSIS — Z87442 Personal history of urinary calculi: Secondary | ICD-10-CM

## 2013-05-13 DIAGNOSIS — M949 Disorder of cartilage, unspecified: Secondary | ICD-10-CM

## 2013-05-13 DIAGNOSIS — Z8249 Family history of ischemic heart disease and other diseases of the circulatory system: Secondary | ICD-10-CM

## 2013-05-13 HISTORY — DX: Other ventricular tachycardia: I47.29

## 2013-05-13 HISTORY — DX: Ventricular tachycardia: I47.2

## 2013-05-13 HISTORY — DX: Other cardiomyopathies: I42.8

## 2013-05-13 HISTORY — DX: Ventricular tachycardia, unspecified: I47.20

## 2013-05-13 LAB — CBC WITH DIFFERENTIAL/PLATELET
Basophils Absolute: 0 10*3/uL (ref 0.0–0.1)
Basophils Relative: 0 % (ref 0–1)
Eosinophils Absolute: 0.1 10*3/uL (ref 0.0–0.7)
Eosinophils Relative: 3 % (ref 0–5)
HCT: 43.4 % (ref 39.0–52.0)
Hemoglobin: 15.3 g/dL (ref 13.0–17.0)
Lymphocytes Relative: 16 % (ref 12–46)
Lymphs Abs: 0.8 10*3/uL (ref 0.7–4.0)
MCH: 28.8 pg (ref 26.0–34.0)
MCHC: 35.3 g/dL (ref 30.0–36.0)
MCV: 81.6 fL (ref 78.0–100.0)
Monocytes Absolute: 0.5 10*3/uL (ref 0.1–1.0)
Monocytes Relative: 10 % (ref 3–12)
Neutro Abs: 3.4 10*3/uL (ref 1.7–7.7)
Neutrophils Relative %: 71 % (ref 43–77)
Platelets: 182 10*3/uL (ref 150–400)
RBC: 5.32 MIL/uL (ref 4.22–5.81)
RDW: 13.4 % (ref 11.5–15.5)
WBC: 4.8 10*3/uL (ref 4.0–10.5)

## 2013-05-13 LAB — POCT I-STAT, CHEM 8
BUN: 13 mg/dL (ref 6–23)
Calcium, Ion: 1.06 mmol/L — ABNORMAL LOW (ref 1.12–1.23)
Chloride: 98 mEq/L (ref 96–112)
Creatinine, Ser: 2.9 mg/dL — ABNORMAL HIGH (ref 0.50–1.35)
Glucose, Bld: 115 mg/dL — ABNORMAL HIGH (ref 70–99)
HCT: 48 % (ref 39.0–52.0)
Hemoglobin: 16.3 g/dL (ref 13.0–17.0)
Potassium: 3.1 mEq/L — ABNORMAL LOW (ref 3.7–5.3)
Sodium: 139 mEq/L (ref 137–147)
TCO2: 30 mmol/L (ref 0–100)

## 2013-05-13 LAB — MAGNESIUM: Magnesium: 1.7 mg/dL (ref 1.5–2.5)

## 2013-05-13 LAB — T4, FREE: Free T4: 1.2 ng/dL (ref 0.80–1.80)

## 2013-05-13 LAB — POCT I-STAT TROPONIN I: Troponin i, poc: 0.03 ng/mL (ref 0.00–0.08)

## 2013-05-13 LAB — TSH: TSH: 0.525 u[IU]/mL (ref 0.350–4.500)

## 2013-05-13 LAB — TROPONIN I
Troponin I: 0.37 ng/mL (ref <0.30)
Troponin I: 0.41 ng/mL (ref <0.30)

## 2013-05-13 LAB — MRSA PCR SCREENING: MRSA by PCR: NEGATIVE

## 2013-05-13 IMAGING — CR DG CHEST 1V PORT
1 series · 1 of 1 positions shown · non-contrast
Comparison: [DATE]

CLINICAL DATA: Tachycardia.

EXAM:
PORTABLE CHEST - 1 VIEW

[AP]
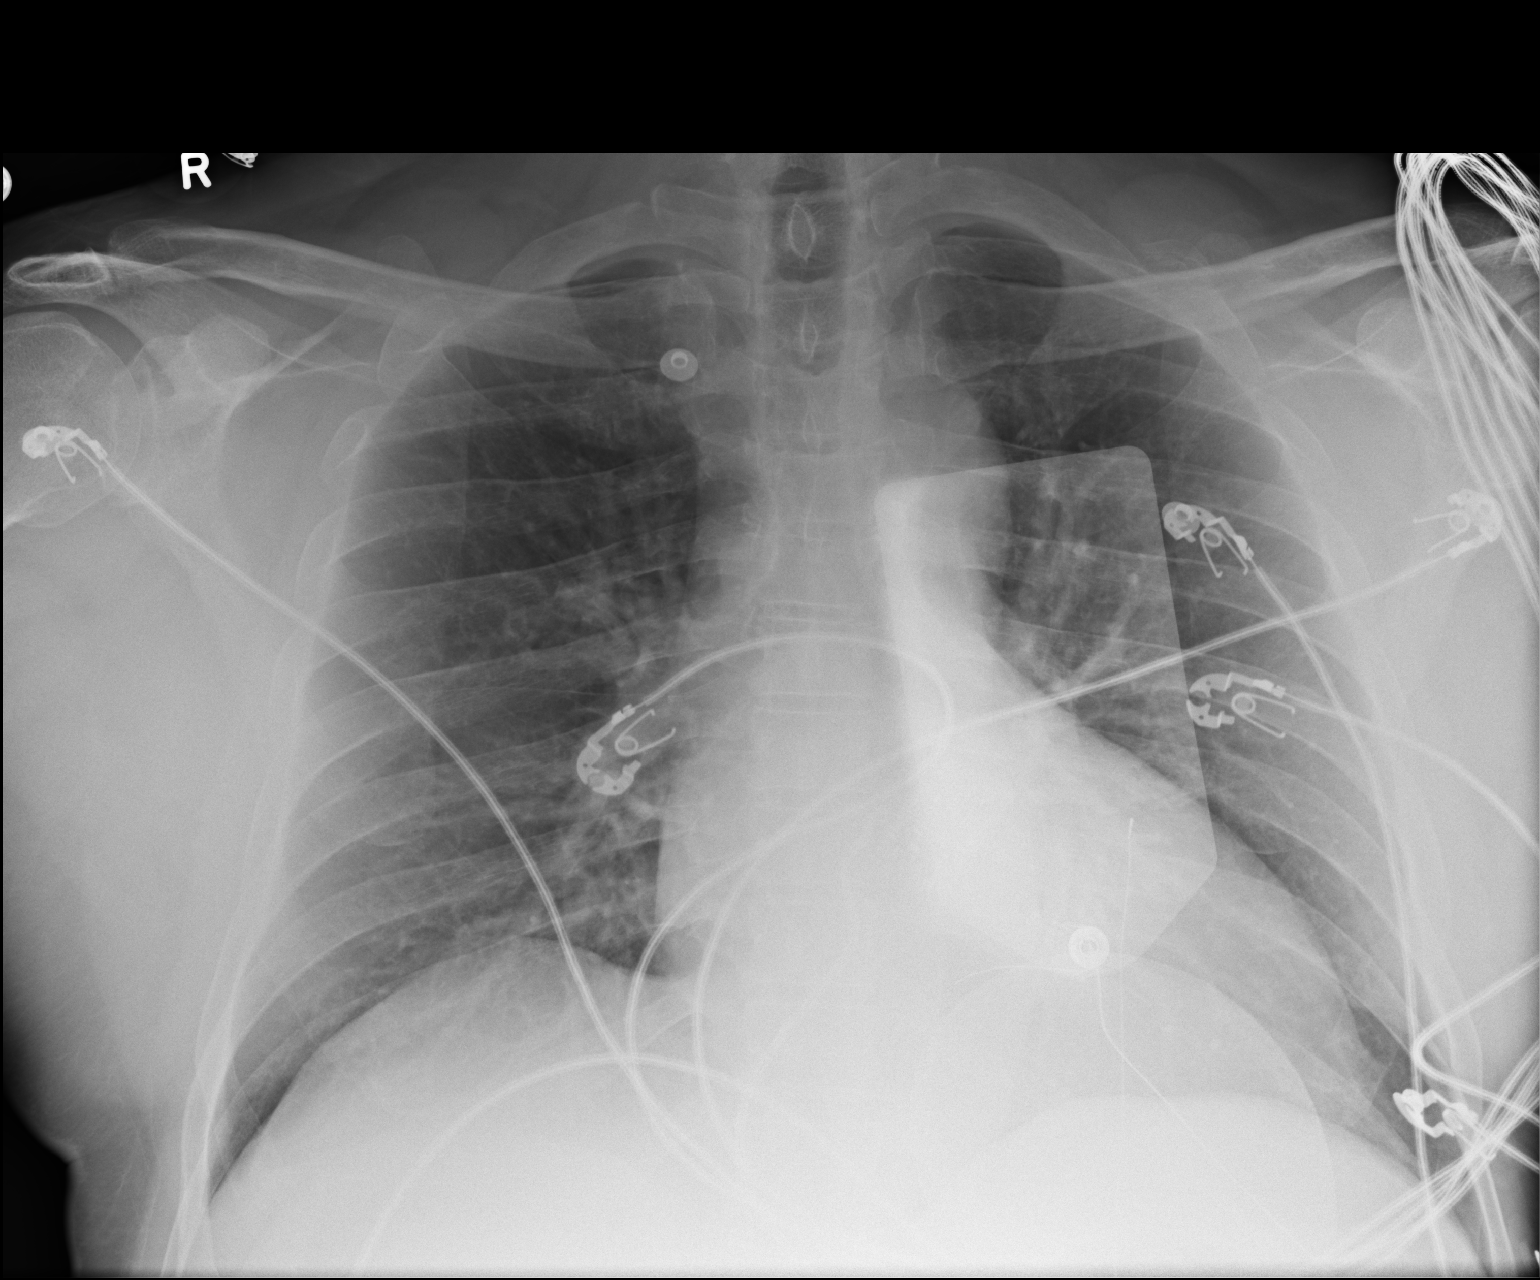

[1 of 1 positions shown; findings below may reference images not displayed]

FINDINGS: [1S] hrs. Low volume film. No edema or focal airspace consolidation.
Mild basilar atelectasis evident. The cardio pericardial silhouette
is enlarged. Telemetry leads overlie the chest. Imaged bony
structures of the thorax are intact.
IMPRESSION: Basilar atelectasis without overt edema or focal lung consolidation.
.

## 2013-05-13 MED ORDER — POTASSIUM CHLORIDE CRYS ER 20 MEQ PO TBCR
40.0000 meq | EXTENDED_RELEASE_TABLET | Freq: Once | ORAL | Status: AC
  Administered 2013-05-13: 40 meq via ORAL
  Filled 2013-05-13: qty 2

## 2013-05-13 MED ORDER — AMIODARONE LOAD VIA INFUSION
150.0000 mg | Freq: Once | INTRAVENOUS | Status: AC
  Administered 2013-05-13: 150 mg via INTRAVENOUS
  Filled 2013-05-13: qty 83.34

## 2013-05-13 MED ORDER — ACETAMINOPHEN 325 MG PO TABS
650.0000 mg | ORAL_TABLET | ORAL | Status: DC | PRN
  Administered 2013-05-15: 650 mg via ORAL
  Filled 2013-05-13: qty 2

## 2013-05-13 MED ORDER — NITROGLYCERIN 0.4 MG SL SUBL: 0.4000 mg | SUBLINGUAL_TABLET | SUBLINGUAL | Status: DC | PRN

## 2013-05-13 MED ORDER — HEPARIN BOLUS VIA INFUSION
4000.0000 [IU] | Freq: Once | INTRAVENOUS | Status: AC
  Administered 2013-05-13: 4000 [IU] via INTRAVENOUS
  Filled 2013-05-13: qty 4000

## 2013-05-13 MED ORDER — METOPROLOL SUCCINATE ER 50 MG PO TB24
50.0000 mg | ORAL_TABLET | Freq: Every day | ORAL | Status: DC
  Administered 2013-05-14 – 2013-05-19 (×6): 50 mg via ORAL
  Filled 2013-05-13 (×6): qty 1

## 2013-05-13 MED ORDER — AMIODARONE HCL IN DEXTROSE 360-4.14 MG/200ML-% IV SOLN
30.0000 mg/h | INTRAVENOUS | Status: DC
  Administered 2013-05-13 – 2013-05-17 (×7): 30 mg/h via INTRAVENOUS
  Filled 2013-05-13 (×14): qty 200

## 2013-05-13 MED ORDER — SODIUM CHLORIDE 0.9 % IJ SOLN
3.0000 mL | Freq: Two times a day (BID) | INTRAMUSCULAR | Status: DC
  Administered 2013-05-14 (×2): via INTRAVENOUS

## 2013-05-13 MED ORDER — ATORVASTATIN CALCIUM 20 MG PO TABS
20.0000 mg | ORAL_TABLET | Freq: Every day | ORAL | Status: DC
  Administered 2013-05-13 – 2013-05-19 (×7): 20 mg via ORAL
  Filled 2013-05-13 (×7): qty 1

## 2013-05-13 MED ORDER — DOXERCALCIFEROL 4 MCG/2ML IV SOLN
2.0000 ug | INTRAVENOUS | Status: DC
  Administered 2013-05-16: 2 ug via INTRAVENOUS
  Filled 2013-05-13 (×2): qty 2

## 2013-05-13 MED ORDER — HEPARIN (PORCINE) IN NACL 100-0.45 UNIT/ML-% IJ SOLN
1250.0000 [IU]/h | INTRAMUSCULAR | Status: DC
  Administered 2013-05-13: 1000 [IU]/h via INTRAVENOUS
  Administered 2013-05-14 (×2): 1250 [IU]/h via INTRAVENOUS
  Filled 2013-05-13 (×2): qty 250

## 2013-05-13 MED ORDER — SODIUM CHLORIDE 0.9 % IJ SOLN: 3.0000 mL | INTRAMUSCULAR | Status: DC | PRN

## 2013-05-13 MED ORDER — SODIUM CHLORIDE 0.9 % IV SOLN
250.0000 mL | INTRAVENOUS | Status: DC | PRN
  Administered 2013-05-14: 250 mL via INTRAVENOUS

## 2013-05-13 MED ORDER — ASPIRIN 81 MG PO CHEW
81.0000 mg | CHEWABLE_TABLET | ORAL | Status: AC
  Administered 2013-05-14: 81 mg via ORAL
  Filled 2013-05-13: qty 1

## 2013-05-13 MED ORDER — ASPIRIN EC 81 MG PO TBEC
81.0000 mg | DELAYED_RELEASE_TABLET | Freq: Every day | ORAL | Status: DC
  Administered 2013-05-15 – 2013-05-19 (×5): 81 mg via ORAL
  Filled 2013-05-13 (×6): qty 1

## 2013-05-13 MED ORDER — METOPROLOL TARTRATE 25 MG PO TABS: 25.0000 mg | ORAL_TABLET | Freq: Two times a day (BID) | ORAL | Status: DC

## 2013-05-13 MED ORDER — METOPROLOL SUCCINATE ER 50 MG PO TB24
50.0000 mg | ORAL_TABLET | Freq: Every day | ORAL | Status: DC
  Filled 2013-05-13: qty 1

## 2013-05-13 MED ORDER — AMIODARONE HCL IN DEXTROSE 360-4.14 MG/200ML-% IV SOLN
60.0000 mg/h | INTRAVENOUS | Status: AC
  Administered 2013-05-13: 60 mg/h via INTRAVENOUS
  Filled 2013-05-13 (×2): qty 200

## 2013-05-13 MED ORDER — RENA-VITE PO TABS
1.0000 | ORAL_TABLET | Freq: Every day | ORAL | Status: DC
  Administered 2013-05-15 – 2013-05-19 (×4): 1 via ORAL
  Filled 2013-05-13 (×8): qty 1

## 2013-05-13 MED ORDER — ONDANSETRON HCL 4 MG/2ML IJ SOLN
4.0000 mg | Freq: Four times a day (QID) | INTRAMUSCULAR | Status: DC | PRN
  Administered 2013-05-14: 4 mg via INTRAVENOUS
  Filled 2013-05-13: qty 2

## 2013-05-13 NOTE — ED Notes (Signed)
Paged IV team to deaccess hemodialysis port

## 2013-05-13 NOTE — Progress Notes (Signed)
Repeat troponin 0.37. Discussed with Dr. Meda Coffee. Start IV heparin per Pharmacy. Continue to cycle troponin.

## 2013-05-13 NOTE — ED Notes (Signed)
Pt here from Dialysis center after going into V tach , pt had all but 30 mins of his treatment , pt converted in a SR without meds  Or cardioversion, no pain at this time

## 2013-05-13 NOTE — Progress Notes (Signed)
  Echocardiogram 2D Echocardiogram has been performed.  Michael [name] 05/13/2013, 3:31 PM

## 2013-05-13 NOTE — ED Provider Notes (Signed)
CSN: [id number]     Arrival date & time 05/13/13  1059 History   None    Chief Complaint  Patient presents with  . Irregular Heart Beat   HPI  58 y/o male with history as noted below who presents with cc of ventricular tachycardia. The patient has been in his usual state of health over the last several days. Today the patient was at dialysis receiving a treatment when he began to feel fatigued. He had 30 minutes left of treatment when he was noted to be hypotensive. He was given 500 cc of NS and was put on the monitor and found to be in ventricular tachycardia. The patient was in ventricular tachycardia when EMS arrived but converted spontaneously prior to receiving any medications. He denies any chest pain or shortness of breath.   Past Medical History  Diagnosis Date  . Hypertension   . Hyperparathyroidism, secondary renal   . Hyperlipidemia   . Fatigue   . Chronic kidney disease     Polycystic kidney disease, not on diaylsis yet  . Kidney stones     pased one, One [facility]azer   . ESRD (end stage renal disease) on dialysis     left av fistula in antecub; [facility] (01/22/2013)   Past Surgical History  Procedure Laterality Date  . Appendectomy    . Cardiac catheterization  04-05-2010    checking for blockage but none-[facility]  . Av fistula placement  12/05/2011    Procedure: ARTERIOVENOUS (AV) FISTULA CREATION;LLEFT ARM  Surgeon: Conrad Craig, MD;  Location: [place];  Service: Vascular;  Laterality: Left;  RADIO-CEPHALIC  fistula left arm  . Av fistula placement  01/11/2012    Procedure: ARTERIOVENOUS (AV) FISTULA CREATION;  Surgeon: Conrad , MD;  Location: [place];  Service: Vascular;  Laterality: Left;  Creation of left brachial cephalic arteriovenous fistula  . Umbilical hernia repair  03/20/2012    Procedure: HERNIA REPAIR UMBILICAL ADULT;  Surgeon: [name], MD;  Location: [place];  Service: General;  Laterality: N/A;  . Insertion of mesh  03/20/2012    Procedure:  INSERTION OF MESH;  UMB Surgeon: [name], MD;  Location: [place];  Service: General;  Laterality: N/A;  . Hernia repair    . Laparotomy  04/02/2012    Procedure: EXPLORATORY LAPAROTOMY;  Surgeon: [name], MD;  Location: [facility] OR;  Service: General;  Laterality: N/A;  Exploratory Laparotomy with resection of small intestine  . Tonsillectomy    . Umbilical hernia repair  01/22/2013    preperitoneal open procedure due to significant adhesions/notes 01/22/2013  . Cystoscopy w/ stone manipulation      "laser once" (01/22/2013)  . Ventral hernia repair N/A 01/22/2013    Procedure: ATTEMPTED LAPAROSCOPIC VENTRAL HERNIA CONVERTED TO OPEN;  Surgeon: [name], MD;  Location: [facility] [place];  Service: General;  Laterality: N/A;  . Insertion of mesh N/A 01/22/2013    Procedure: INSERTION OF MESH;  Surgeon: [name], MD;  Location: [facility] OR;  Service: General;  Laterality: N/A;   Family History  Problem Relation Age of Onset  . Heart disease Mother   . Hyperlipidemia Mother   . Hypertension Mother   . Kidney disease Father   . Stroke Father   . Kidney disease Brother    History  Substance Use Topics  . Smoking status: Never Smoker   . Smokeless tobacco: Never Used  . Alcohol Use: No    Review of Systems  Constitutional: Positive for  fatigue. Negative for fever and chills.  Respiratory: Negative for shortness of breath.   Cardiovascular: Negative for chest pain.  Gastrointestinal: Negative for nausea, vomiting and abdominal pain.  Neurological: Negative for weakness and numbness.  All other systems reviewed and are negative.   Allergies  Lipitor  Home Medications   No current outpatient prescriptions on file. BP 157/85  Pulse 72  Temp(Src) 98.1 F (36.7 C) (Oral)  Resp 13  Ht 5\' 6"  (1.676 m)  Wt 200 lb 8 oz (90.946 kg)  BMI 32.38 kg/m2  SpO2 98% Physical Exam  Constitutional: He is oriented to person, place, and time. He appears well-developed and  well-nourished. No distress.  HENT:  Head: Normocephalic and atraumatic.  Mouth/Throat: No oropharyngeal exudate.  Eyes: Conjunctivae are normal. Pupils are equal, round, and reactive to light.  Neck: Normal range of motion. Neck supple.  Cardiovascular: Normal rate and normal heart sounds.  Exam reveals no gallop and no friction rub.   No murmur heard. Pulmonary/Chest: Effort normal and breath sounds normal.  Abdominal: Soft. He exhibits no distension. There is no tenderness.  Musculoskeletal: Normal range of motion. He exhibits no edema and no tenderness.  Neurological: He is alert and oriented to person, place, and time. He has normal strength and normal reflexes. No cranial nerve deficit or sensory deficit. Coordination normal. GCS eye subscore is 4. GCS verbal subscore is 5. GCS motor subscore is 6.  Skin: Skin is warm and dry.  LUE AV graft.   ED Course  Procedures (including critical care time) Labs Review Labs Reviewed  TROPONIN I - Abnormal; Notable for the following:    Troponin I 0.37 (*)    All other components within normal limits  TROPONIN I - Abnormal; Notable for the following:    Troponin I 0.41 (*)    All other components within normal limits  POCT I-STAT, CHEM 8 - Abnormal; Notable for the following:    Potassium 3.1 (*)    Creatinine, Ser 2.90 (*)    Glucose, Bld 115 (*)    Calcium, Ion 1.06 (*)    All other components within normal limits  MRSA PCR SCREENING  CBC WITH DIFFERENTIAL  MAGNESIUM  TROPONIN I  TSH  T4, FREE  BASIC METABOLIC PANEL  MAGNESIUM  HEPARIN LEVEL (UNFRACTIONATED)  CBC  POCT I-STAT TROPONIN I   Imaging Review Dg Chest Portable 1 View  05/13/2013   CLINICAL DATA:  Tachycardia.  EXAM: PORTABLE CHEST - 1 VIEW  COMPARISON:  01/15/2013  FINDINGS: 1103 hrs. Low volume film. No edema or focal airspace consolidation. Mild basilar atelectasis evident. The cardio pericardial silhouette is enlarged. Telemetry leads overlie the chest. Imaged  bony structures of the thorax are intact.  IMPRESSION: Basilar atelectasis without overt edema or focal lung consolidation. .   Electronically Signed   By: Misty Stanley M.D.   On: 05/13/2013 11:32    EKG Interpretation    Date/Time:  Monday May 13 2013 11:01:14 EST Ventricular Rate:  91 PR Interval:  187 QRS Duration: 124 QT Interval:  427 QTC Calculation: 525 R Axis:   -40 Text Interpretation:  Sinus rhythm Ventricular premature complex Nonspecific IVCD with LAD Left ventricular hypertrophy Similar to prior Confirmed by Mingo Amber  MD, [place] ([id number]) on 05/13/2013 11:10:00 AM           MDM   1. VT (ventricular tachycardia)   2. Chronic kidney disease (CKD), stage IV (severe)   3. End stage renal disease  58 y/o male with history as noted below who presents with from dialysis for ventricular tachycardia. [facility] reviewed and c/w monomorphic Vtach. Negative cath in 2011. On arrival, in HDS condition. NSR on monitor. EKG without acute ischemic changes. Doubt PE. Doubt ACS. Loaded with amio and started on gtt. The patient was admitted to cardiology in HDS condition.    Donita Brooks, MD 05/13/13 2104

## 2013-05-13 NOTE — ED Notes (Signed)
Heparin gtt started after being verified by second RN  Kathreen Cornfield, RN

## 2013-05-13 NOTE — ED Notes (Signed)
IV team at bedside at this time.  Pt's wife also remains at bedside.

## 2013-05-13 NOTE — H&P (Signed)
Please see full consult note.   Michael [name], Michael [name] 05/13/2013

## 2013-05-13 NOTE — ED Notes (Signed)
Pt ate 100% of dinner tray

## 2013-05-13 NOTE — ED Notes (Signed)
Critical Troponin Level called from lab.  Paint. Paged.

## 2013-05-13 NOTE — ED Notes (Signed)
Received call from IV team st's they will come deaccess dialysis IV

## 2013-05-13 NOTE — Consult Note (Signed)
Patient ID: Michael [name] [name]: [id number], DOB/AGE: Apr 12, 1956   Admit date: 05/13/2013 Date of Consult: 05/13/2013  Primary Physician: Vena Austria, MD Primary Cardiologist: New to [place] Reason for Consultation: Ventricular tachycardia  History of Present Illness Michael [name] is a 58 y.o. male with ESRD on HD and HTN who presents to the ED with tachycardia during HD. Michael [name] reports he was in his usual state of health today until he developed palpitations and lightheadedness during dialysis this afternoon. He was approximately 30 minutes from the end time and he tells me that he became hypotensive and they stopped dialysis. Shortly after he developed "racing" palpitations and lightheadedness. EMS was called. On EMS arrival, he was in ventricular tachycardia. He was alert with a pulse. According to ED report, he was given IV amiodarone 150 mg x 1. His WCT terminated and he has remained in SR here in the ED. Currently he denies any complaints. He denies CP or SOB. He and his wife denies syncope or AMS. He denies falls. He has had similar symptoms with dialysis and he usually feels lightheaded post dialysis; however, this is the first episode that was accompanied by racing palpitations. He also reports h/o "irregular heartbeats" but denies any history of arrhythmias, AFib, VT, etc. He reports compliance with medications. He denies any history of CAD, CHF, valvular heart disease or arrhythmias. He is on the transplant list at [facility] and prior to being placed on the list he underwent cardiac cath 2011 at which time he was told "no blockage."   Past Medical History Past Medical History  Diagnosis Date  . Hypertension   . Hyperparathyroidism, secondary renal   . Hyperlipidemia   . Fatigue   . Chronic kidney disease     Polycystic kidney disease, not on diaylsis yet  . Kidney stones     pased one, One Lazer   . ESRD (end stage renal disease) on dialysis     left av fistula  in antecub; [facility] (01/22/2013)    Past Surgical History Past Surgical History  Procedure Laterality Date  . Appendectomy    . Cardiac catheterization  04-05-2010    checking for blockage but none-[facility]  . Av fistula placement  12/05/2011    Procedure: ARTERIOVENOUS (AV) FISTULA CREATION;LLEFT ARM  Surgeon: Conrad [place], MD;  Location: [place];  Service: Vascular;  Laterality: Left;  RADIO-CEPHALIC  fistula left arm  . Av fistula placement  01/11/2012    Procedure: ARTERIOVENOUS (AV) FISTULA CREATION;  Surgeon: Conrad [place], MD;  Location: [place];  Service: Vascular;  Laterality: Left;  Creation of left brachial cephalic arteriovenous fistula  . Umbilical hernia repair  03/20/2012    Procedure: HERNIA REPAIR UMBILICAL ADULT;  Surgeon: [name], MD;  Location: [place];  Service: General;  Laterality: N/A;  . Insertion of mesh  03/20/2012    Procedure: INSERTION OF MESH;  UMB Surgeon: [name], MD;  Location: [place];  Service: General;  Laterality: N/A;  . Hernia repair    . Laparotomy  04/02/2012    Procedure: EXPLORATORY LAPAROTOMY;  Surgeon: [name], MD;  Location: [facility] OR;  Service: General;  Laterality: N/A;  Exploratory Laparotomy with resection of small intestine  . Tonsillectomy    . Umbilical hernia repair  01/22/2013    preperitoneal open procedure due to significant adhesions/notes 01/22/2013  . Cystoscopy w/ stone manipulation      "laser once" (01/22/2013)  . Ventral hernia repair N/A 01/22/2013  Procedure: ATTEMPTED LAPAROSCOPIC VENTRAL HERNIA CONVERTED TO OPEN;  Surgeon: [name], MD;  Location: [place];  Service: General;  Laterality: N/A;  . Insertion of mesh N/A 01/22/2013    Procedure: INSERTION OF MESH;  Surgeon: [name], MD;  Location: [place];  Service: General;  Laterality: N/A;    Allergies/Intolerances Allergies  Allergen Reactions  . Lipitor [Atorvastatin] Itching    Leg pain    Current Home Medications ASK your  doctor about these medications       atorvastatin 40 MG tablet  Commonly known as:  LIPITOR  Take 40 mg by mouth daily.     metoprolol succinate 100 MG 24 hr tablet  Commonly known as:  TOPROL-XL  Take 100 mg by mouth daily. Take with or immediately following a meal.     multivitamin with minerals Tabs tablet  Take 1 tablet by mouth daily.     Inpatient Medications   . amiodarone (NEXTERONE PREMIX) 360 mg/200 mL dextrose 60 mg/hr (05/13/13 1128)   Followed by  . amiodarone (NEXTERONE PREMIX) 360 mg/200 mL dextrose      Family History Family History  Problem Relation Age of Onset  . Heart disease Mother   . Hyperlipidemia Mother   . Hypertension Mother   . Kidney disease Father   . Stroke Father   . Kidney disease Brother      Social History History   Social History  . Marital Status: Married    Spouse Name: N/A    Number of Children: N/A  . Years of Education: N/A   Occupational History  . Not on file.   Social History Main Topics  . Smoking status: Never Smoker   . Smokeless tobacco: Never Used  . Alcohol Use: No  . Drug Use: No  . Sexual Activity: Yes   Other Topics Concern  . Not on file   Social History Narrative  . No narrative on file     Review of Systems General: No chills, fever, night sweats or weight changes  Cardiovascular:  No chest pain, dyspnea on exertion, edema, orthopnea, palpitations, paroxysmal nocturnal dyspnea Dermatological: No rash, lesions or masses Respiratory: No cough, dyspnea Urologic: No hematuria, dysuria Abdominal: No nausea, vomiting, diarrhea, bright red blood per rectum, melena, or hematemesis Neurologic: No visual changes, weakness, changes in mental status All other systems reviewed and are otherwise negative except as noted above.  Physical Exam Vitals: Blood pressure 122/66, pulse 77, resp. rate 17, SpO2 100.00%.  General: Well developed, well appearing 58 y.o. male in no acute distress. HEENT:  Normocephalic, atraumatic. EOMs intact. Sclera nonicteric. Oropharynx clear.  Neck: Supple. No JVD. Lungs: Respirations regular and unlabored, CTA bilaterally. No wheezes, rales or rhonchi. Heart: RRR. S1, S2 present. No murmurs, rub, S3 or S4. Abdomen: Soft, non-tender, non-distended. BS present x 4 quadrants. No hepatosplenomegaly.  Extremities: No clubbing, cyanosis or edema. DP/PT/Radials 2+ and equal bilaterally. Psych: Normal affect. Neuro: Alert and oriented X 3. Moves all extremities spontaneously. Musculoskeletal: No kyphosis. Skin: Intact. Warm and dry. No rashes or petechiae in exposed areas.   Labs POC troponin 0.03 Magnesium 1.7  Lab Results  Component Value Date   WBC 4.8 05/13/2013   HGB 16.3 05/13/2013   HCT 48.0 05/13/2013   MCV 81.6 05/13/2013   PLT 182 05/13/2013    Recent Labs Lab 05/13/13 1226  NA 139  K 3.1*  CL 98  BUN 13  CREATININE 2.90*  GLUCOSE 115*    Radiology/Studies Dg Chest  Portable 1 View  05/13/2013   CLINICAL DATA:  Tachycardia.  EXAM: PORTABLE CHEST - 1 VIEW  COMPARISON:  01/15/2013  FINDINGS: 1103 hrs. Low volume film. No edema or focal airspace consolidation. Mild basilar atelectasis evident. The cardio pericardial silhouette is enlarged. Telemetry leads overlie the chest. Imaged bony structures of the thorax are intact.  IMPRESSION: Basilar atelectasis without overt edema or focal lung consolidation. .   Electronically Signed   By: Misty Stanley M.D.   On: 05/13/2013 11:32    Echocardiogram April 2010  Study Conclusions 1. Left ventricle: Wall thickness was at the upper limits of normal. Systolic function was normal. The estimated ejection fraction was in the range of 55% to 60%. Probable akinesis of the basal inferoseptal myocardium. There was an increased relative contribution of atrial contraction to ventricular filling. Doppler parameters are consistent with abnormal left ventricular relaxation (grade 1 diastolic dysfunction). 2.  Aortic valve: Mild diffuse sclerosis without stenosis, consistent with sclerosis. Moderate regurgitation. 3. Aorta: The aorta was abnormal and moderately dilated. 4. Aortic root: The aortic root was well visualized, moderately dilated, non-tortuous, and non-ectatic. 5. Mitral valve: Mild regurgitation.  12-lead ECG in ED - sinus rhythm at 91 bpm; inferolateral T wave inversions; manual QTc 493 msec Telemetry in ED - SR with frequent multifocal PVCs  Assessment and Plan 1. Wide complex tachycardia, most consistent with ventricular tachycardia 2. ESRD on HD 3. HTN  Michael [name] presents with symptomatic VT. Admit to stepdown. Replete potassium cautiously. Replete magnesium to keep >2. Check echo now. Cycle troponin to rule out MI. Check thyroid panel. Add metoprolol. Obtain cardiac cath records from [facility]. Notify Nephrology of admission and need for dialysis. Further recs pending results of the above-mentioned studies and his clinical course.      Signed, Ileene Hutchinson, PA-C 05/13/2013, 1:26 PM   The patient was seen, examined and discussed with Ileene Hutchinson, PA-C and I agree with the above.   A very pleasant knowledgeable 58 year old gentleman with ESRD on a transplant list. He was transferred here by EMS from HD  For an episode of hypotension and diaphoresis and was found to be in regular wide complex tachycardia with ventricular rate 200 BPM. The tachycardia lasted for about an hour and resolved in the EMS after bolus of 150 mg iv Amiodarone. The patient never lost consciousness. He states that he has experienced episodes of hypotension with increased frequency at the end of almost every HD session. These require iv fluids and Trendelenburg position, however those were never so symptomatic as today and never associated with palpitations. The patient underwent a cardiac cath in [facility] in 2011 that was normal per patient report, we don't know if it was completely normal or  non-obstructive. We will obtain records. The patient states that his stress test was negative back then, but cath was performed because of " irregular beats" (? Frequent PVCs vs a-fib? The patient is  Not familiar with neither of these terms). His episode today seems like a monomorphic VT originating in the inferolateral wall. There is very frequent ectopy that appears to be coming from the same focus. We will start metoprolol PO, replace potassium (K 3.1). We need to rule out ischemia, there are new negative T waves in leads II avd V5,6. We will consult EP, discussed with Dt Lovena Le.      Ena Dawley, Lemmie Evens 05/13/2013

## 2013-05-13 NOTE — Consult Note (Signed)
[facility] Renal Consultation Note    Indication for Consultation:  Management of ESRD/hemodialysis; anemia, hypertension/volume and secondary hyperparathyroidism  HPI: Michael [name] is a 58 y.o. male with ESRD secondary to PCKD on (MWF HD) with past medical history significant for hypertension who had an uneventufll dialysis until the last 30 minutes where he had sudden drop in BP and incerase in HR to 200 with chest pressure/tightness consistant with v tach showed on tracing at the dialysis center. EMS was called He was given amiodarone 150 x 1 and WCT terminated and he has remained in NSR since - Normally takes metoprolol tartrate 50 mg per day. Earlier in his dialysis treatment today he had reported to the rounding physician that he thought he had been having panic attacks for the past two weeks.  He described the feeling of heart racing followed by chest tightness with the same lightheadedness he feels when his BP drops during HD.  He was advised to tell staff if this occurred during treatment today which it did and heart rate tat that time was 190 and Review of K levels pre HD show they run (3.6 to 4.5 pre HD) and he runs on a 2 Ca bath. Mg levles are generally 1.5 pre HD.  EDW has been adjusted lately - now at 85 and had a "good treatment" Friday.   Past Medical History  Diagnosis Date  . Hypertension   . Hyperparathyroidism, secondary renal   . Hyperlipidemia   . Fatigue   . Chronic kidney disease     Polycystic kidney disease, not on diaylsis yet  . Kidney stones     pased one, One Lazer   . ESRD (end stage renal disease) on dialysis     left av fistula in antecub; [facility] (01/22/2013)   Past Surgical History  Procedure Laterality Date  . Appendectomy    . Cardiac catheterization  04-05-2010    checking for blockage but none-[facility]  . Av fistula placement  12/05/2011    Procedure: ARTERIOVENOUS (AV) FISTULA CREATION;LLEFT ARM  Surgeon: Conrad [place],  MD;  Location: [place];  Service: Vascular;  Laterality: Left;  RADIO-CEPHALIC  fistula left arm  . Av fistula placement  01/11/2012    Procedure: ARTERIOVENOUS (AV) FISTULA CREATION;  Surgeon: Conrad Horine, MD;  Location: [place];  Service: Vascular;  Laterality: Left;  Creation of left brachial cephalic arteriovenous fistula  . Umbilical hernia repair  03/20/2012    Procedure: HERNIA REPAIR UMBILICAL ADULT;  Surgeon: [name], MD;  Location: [place];  Service: General;  Laterality: N/A;  . Insertion of mesh  03/20/2012    Procedure: INSERTION OF MESH;  UMB Surgeon: [name], MD;  Location: [place];  Service: General;  Laterality: N/A;  . Hernia repair    . Laparotomy  04/02/2012    Procedure: EXPLORATORY LAPAROTOMY;  Surgeon: [name], MD;  Location: [facility] OR;  Service: General;  Laterality: N/A;  Exploratory Laparotomy with resection of small intestine  . Tonsillectomy    . Umbilical hernia repair  01/22/2013    preperitoneal open procedure due to significant adhesions/notes 01/22/2013  . Cystoscopy w/ stone manipulation      "laser once" (01/22/2013)  . Ventral hernia repair N/A 01/22/2013    Procedure: ATTEMPTED LAPAROSCOPIC VENTRAL HERNIA CONVERTED TO OPEN;  Surgeon: [name], MD;  Location: [place];  Service: General;  Laterality: N/A;  . Insertion of mesh N/A 01/22/2013    Procedure: INSERTION OF MESH;  Surgeon: [name], MD;  Location: [facility] OR;  Service: General;  Laterality: N/A;   Family History  Problem Relation Age of Onset  . Heart disease Mother   . Hyperlipidemia Mother   . Hypertension Mother   . Kidney disease Father   . Stroke Father   . Kidney disease Brother    Social History:  reports that he has never smoked. He has never used smokeless tobacco. He reports that he does not drink alcohol or use illicit drugs. Allergies  Allergen Reactions  . Lipitor [Atorvastatin] Itching    Leg pain   Prior to Admission medications   Medication Sig  Start Date End Date Taking? Authorizing Provider  atorvastatin (LIPITOR) 40 MG tablet Take 40 mg by mouth daily.    Historical Provider, MD  metoprolol succinate (TOPROL-XL) 100 MG 24 hr tablet Take 100 mg by mouth daily. Take with or immediately following a meal.    Historical Provider, MD  Multiple Vitamin (MULTIVITAMIN WITH MINERALS) TABS tablet Take 1 tablet by mouth daily.    Historical Provider, MD   Current Facility-Administered Medications  Medication Dose Route Frequency Provider Last Rate Last Dose  . acetaminophen (TYLENOL) tablet 650 mg  650 mg Oral Q4H PRN Brooke O Edmisten, PA-C      . amiodarone (NEXTERONE PREMIX) 360 mg/200 mL dextrose IV infusion  60 mg/hr Intravenous Continuous Donita Brooks, MD 33.3 mL/hr at 05/13/13 1128 60 mg/hr at 05/13/13 1128   Followed by  . amiodarone (NEXTERONE PREMIX) 360 mg/200 mL dextrose IV infusion  30 mg/hr Intravenous Continuous Donita Brooks, MD      . Derrill Memo ON 05/14/2013] aspirin EC tablet 81 mg  81 mg Oral Daily Brooke O Edmisten, PA-C      . metoprolol tartrate (LOPRESSOR) tablet 25 mg  25 mg Oral BID Brooke O Edmisten, PA-C      . nitroGLYCERIN (NITROSTAT) SL tablet 0.4 mg  0.4 mg Sublingual Q5 Min x 3 PRN Brooke O Edmisten, PA-C      . ondansetron (ZOFRAN) injection 4 mg  4 mg Intravenous Q6H PRN Brooke O Edmisten, PA-C      . potassium chloride SA (K-DUR,KLOR-CON) CR tablet 40 mEq  40 mEq Oral Once Andrez Grime, PA-C       Current Outpatient Prescriptions  Medication Sig Dispense Refill  . atorvastatin (LIPITOR) 40 MG tablet Take 40 mg by mouth daily.      . metoprolol succinate (TOPROL-XL) 100 MG 24 hr tablet Take 100 mg by mouth daily. Take with or immediately following a meal.      . Multiple Vitamin (MULTIVITAMIN WITH MINERALS) TABS tablet Take 1 tablet by mouth daily.       Labs: Basic Metabolic Panel:  Recent Labs Lab 05/13/13 1226  NA 139  K 3.1*  CL 98  GLUCOSE 115*  BUN 13  CREATININE 2.90*   CBC:  Recent Labs Lab 05/13/13 1123 05/13/13 1226  WBC 4.8  --   NEUTROABS 3.4  --   HGB 15.3 16.3  HCT 43.4 48.0  MCV 81.6  --   PLT 182  --   Studies/Results: Dg Chest Portable 1 View  05/13/2013   CLINICAL DATA:  Tachycardia.  EXAM: PORTABLE CHEST - 1 VIEW  COMPARISON:  01/15/2013  FINDINGS: 1103 hrs. Low volume film. No edema or focal airspace consolidation. Mild basilar atelectasis evident. The cardio pericardial silhouette is enlarged. Telemetry leads overlie the chest. Imaged bony structures of the thorax are intact.  IMPRESSION: Basilar  atelectasis without overt edema or focal lung consolidation. .   Electronically Signed   By: Misty Stanley M.D.   On: 05/13/2013 11:32   ROS: As per HPI otherwise negative Physical Exam: Filed Vitals:   05/13/13 1200 05/13/13 1215 05/13/13 1230 05/13/13 1245  BP: 127/73 127/76 118/73 122/66  Pulse: 81 81 80 77  Resp: 13 12 14 17   SpO2: 100% 100% 100% 100%     General: WDWN pleasant gentleman in NAD Head: Normocephalic, atraumatic, sclera non-icteric, mucus membranes are moist Neck: Supple. JVD not elevated. Lungs: Clear bilaterally to auscultation without wheezes, rales, or rhonchi. Breathing is unlabored. Heart: RRR with S1 S2. No murmurs, rubs, or gallops appreciated. Abdomen: Soft, non-tender, non-distended with normoactive bowel sounds. Midline scar from hernia repair and old right LQ scar M-S:  Strength and tone appear normal for age. Lower extremities:without edema or ischemic changes, no open wounds  Neuro: Alert and oriented X 3. Moves all extremities spontaneously. Psych:  Responds to questions appropriately with a normal affect. Dialysis Access: left upper AVF patent  Dialysis Orders: MWF @ [facility]  88.5 kg 4hr 59min 2K/2Ca Heparin 8400 LUA AVF 450/A1.5  Hectorol 34mcg Epogen none Venofer none  Recent Labs: Hgb 13.9 1/07 23% sat 12/17 ferritin122 October  iPTH 198 10/22 Ca nd P ok without binders Mg 1.5   Assessment/Plan: 1. V  tach - from history of symptoms given to rounding physician at dialysis, it appears he may have been having intermittent episodes of arrythmia over the past two weeks; hx of cardiac cath at [place] neg for blockage by pt report; new neg T waves in II and V 5,6; cards following and to consult EP 2. ESRD -  MWF K 3.1 - post HD tx with 40 KCl; short 30 minutes; no indication for HD today or tomorrow. Mg ok at 1.7 - may need to be on a 3 K bath as an outpt or ^ K in diet 3. Hypertension/volume  - on metoprolol 50 and vol control; edw recently increased to 89 from 88.5 4. Anemia  - Hgb 15 - no ESA 5. Metabolic bone disease -  Continue hectorol 2; no binders required. 6. Nutrition - renal diet + multivit; pt watches diet carefully as evidenced by normal P, modest K and no need for binders and adherence to fluid restriction. 7. Hyperlipidemia - on statin  Myriam Jacobson, PA-C [place] [phone] 05/13/2013, 3:15 PM   I have seen and examined patient, discussed with PA and agree with assessment and plan as outlined above.  ESRD patient with HTN presented with hypotension and diaphoresis towards the end of dialysis today. HR was suddenly up at 200 bpm and pt developed chest pain.  Tracing at outpt HD showed wide complex tachycardia c/w VT.  EMS called and gave IV amiodoarone and pt converted to NSR. He is stable currently.  He has been having palpitations the last couple of weeks which he attributed to anxiety or panic attacks.  Also has been having problems with hypotension at HD lately.  K levels have been low to normal, magnesium levels usually at 1.5 (low end of range), and is 1.7 today.  Repleting K. No need for HD today..  Will follow.    Kelly Splinter MD pager [phone]    cell [phone] 05/13/2013, 4:15 PM

## 2013-05-13 NOTE — ED Notes (Signed)
Diet order obtained and dinner tray ordered for pt.

## 2013-05-13 NOTE — Progress Notes (Signed)
ANTICOAGULATION CONSULT NOTE - Initial Consult  Pharmacy Consult for heparin Indication: chest pain/ACS  Allergies  Allergen Reactions  . Lipitor [Atorvastatin] Itching    Leg pain    Patient Measurements:   Heparin Dosing Weight: 82.7kg  Vital Signs: Temp: 97.7 F (36.5 C) (01/12 1558) Temp src: Oral (01/12 1558) BP: 119/66 mmHg (01/12 1515) Pulse Rate: 75 (01/12 1515)  Labs:  Recent Labs  05/13/13 1123 05/13/13 1226 05/13/13 1503  HGB 15.3 16.3  --   HCT 43.4 48.0  --   PLT 182  --   --   CREATININE  --  2.90*  --   TROPONINI  --   --  0.37*    The CrCl is unknown because both a height and weight (above a minimum accepted value) are required for this calculation.   Medical History: Past Medical History  Diagnosis Date  . Hypertension   . Hyperparathyroidism, secondary renal   . Hyperlipidemia   . Fatigue   . Chronic kidney disease     Polycystic kidney disease, not on diaylsis yet  . Kidney stones     pased one, One Lazer   . ESRD (end stage renal disease) on dialysis     left av fistula in antecub; [facility] (01/22/2013)    Medications:  Infusions:  . amiodarone (NEXTERONE PREMIX) 360 mg/200 mL dextrose 60 mg/hr (05/13/13 1128)   Followed by  . amiodarone (NEXTERONE PREMIX) 360 mg/200 mL dextrose    . heparin    . heparin      Assessment: 37 yom presented to the ED with tachycardia during HD. Troponin is elevated. To start empiric heparin for anticoagulation. CBC is WNL. He was not on systemic anticoagulation PTA.   Goal of Therapy:  Heparin level 0.3-0.7 units/ml Monitor platelets by anticoagulation protocol: Yes   Plan:  1. Heparin bolus 4000 units IV x 1 2. Heparin gtt 1000 units/hr 3. Check an 8 hour heparin level 4. Daily heparin level and CBC  Cloey Sferrazza, Rande Lawman 05/13/2013,4:58 PM

## 2013-05-14 ENCOUNTER — Encounter (HOSPITAL_COMMUNITY)
Admission: EM | Disposition: A | Discharge status: Home / Self Care | Payer: Self-pay | Source: Home / Self Care | Attending: Cardiology

## 2013-05-14 ENCOUNTER — Encounter (HOSPITAL_COMMUNITY): Payer: Self-pay | Admitting: Internal Medicine

## 2013-05-14 DIAGNOSIS — I428 Other cardiomyopathies: Secondary | ICD-10-CM

## 2013-05-14 DIAGNOSIS — I5021 Acute systolic (congestive) heart failure: Secondary | ICD-10-CM

## 2013-05-14 HISTORY — PX: LEFT HEART CATHETERIZATION WITH CORONARY ANGIOGRAM: SHX5451

## 2013-05-14 LAB — BASIC METABOLIC PANEL
BUN: 17 mg/dL (ref 6–23)
CO2: 28 mEq/L (ref 19–32)
Calcium: 8 mg/dL — ABNORMAL LOW (ref 8.4–10.5)
Chloride: 99 mEq/L (ref 96–112)
Creatinine, Ser: 3.83 mg/dL — ABNORMAL HIGH (ref 0.50–1.35)
GFR calc Af Amer: 19 mL/min — ABNORMAL LOW (ref >90)
GFR calc non Af Amer: 16 mL/min — ABNORMAL LOW (ref >90)
Glucose, Bld: 94 mg/dL (ref 70–99)
Potassium: 3.7 mEq/L (ref 3.7–5.3)
Sodium: 139 mEq/L (ref 137–147)

## 2013-05-14 LAB — HEPARIN LEVEL (UNFRACTIONATED)
Heparin Unfractionated: 0.2 IU/mL — ABNORMAL LOW (ref 0.30–0.70)
Heparin Unfractionated: 0.39 IU/mL (ref 0.30–0.70)

## 2013-05-14 LAB — CBC
HCT: 41.8 % (ref 39.0–52.0)
Hemoglobin: 14.2 g/dL (ref 13.0–17.0)
MCH: 28.9 pg (ref 26.0–34.0)
MCHC: 34 g/dL (ref 30.0–36.0)
MCV: 85 fL (ref 78.0–100.0)
Platelets: 168 10*3/uL (ref 150–400)
RBC: 4.92 MIL/uL (ref 4.22–5.81)
RDW: 13.6 % (ref 11.5–15.5)
WBC: 5.3 10*3/uL (ref 4.0–10.5)

## 2013-05-14 LAB — PROTIME-INR
INR: 1.22 (ref 0.00–1.49)
Prothrombin Time: 15.1 seconds (ref 11.6–15.2)

## 2013-05-14 LAB — MAGNESIUM: Magnesium: 1.7 mg/dL (ref 1.5–2.5)

## 2013-05-14 LAB — TROPONIN I: Troponin I: 0.35 ng/mL (ref <0.30)

## 2013-05-14 SURGERY — LEFT HEART CATHETERIZATION WITH CORONARY ANGIOGRAM
Anesthesia: LOCAL

## 2013-05-14 MED ORDER — MIDAZOLAM HCL 2 MG/2ML IJ SOLN
INTRAMUSCULAR | Status: AC
  Filled 2013-05-14: qty 2

## 2013-05-14 MED ORDER — NEPRO/CARBSTEADY PO LIQD
237.0000 mL | ORAL | Status: DC | PRN
  Filled 2013-05-14: qty 237

## 2013-05-14 MED ORDER — HEPARIN (PORCINE) IN NACL 100-0.45 UNIT/ML-% IJ SOLN
1500.0000 [IU]/h | INTRAMUSCULAR | Status: DC
  Filled 2013-05-14: qty 250

## 2013-05-14 MED ORDER — HEPARIN (PORCINE) IN NACL 2-0.9 UNIT/ML-% IJ SOLN
INTRAMUSCULAR | Status: AC
  Filled 2013-05-14: qty 1000

## 2013-05-14 MED ORDER — NITROGLYCERIN 0.2 MG/ML ON CALL CATH LAB
INTRAVENOUS | Status: AC
  Filled 2013-05-14: qty 1

## 2013-05-14 MED ORDER — LIDOCAINE-PRILOCAINE 2.5-2.5 % EX CREA
1.0000 "application " | TOPICAL_CREAM | CUTANEOUS | Status: DC | PRN
  Filled 2013-05-14: qty 5

## 2013-05-14 MED ORDER — SODIUM CHLORIDE 0.9 % IV SOLN: INTRAVENOUS | Status: DC

## 2013-05-14 MED ORDER — PENTAFLUOROPROP-TETRAFLUOROETH EX AERO: 1.0000 "application " | INHALATION_SPRAY | CUTANEOUS | Status: DC | PRN

## 2013-05-14 MED ORDER — FENTANYL CITRATE 0.05 MG/ML IJ SOLN
INTRAMUSCULAR | Status: AC
  Filled 2013-05-14: qty 2

## 2013-05-14 MED ORDER — HYDRALAZINE HCL 20 MG/ML IJ SOLN
INTRAMUSCULAR | Status: AC
  Filled 2013-05-14: qty 1

## 2013-05-14 MED ORDER — LIDOCAINE HCL (PF) 1 % IJ SOLN: 5.0000 mL | INTRAMUSCULAR | Status: DC | PRN

## 2013-05-14 MED ORDER — ALTEPLASE 2 MG IJ SOLR
2.0000 mg | Freq: Once | INTRAMUSCULAR | Status: AC | PRN
  Filled 2013-05-14: qty 2

## 2013-05-14 MED ORDER — HEPARIN SODIUM (PORCINE) 1000 UNIT/ML DIALYSIS
1000.0000 [IU] | INTRAMUSCULAR | Status: DC | PRN
  Filled 2013-05-14: qty 1

## 2013-05-14 MED ORDER — SODIUM CHLORIDE 0.9 % IV SOLN: 100.0000 mL | INTRAVENOUS | Status: DC | PRN

## 2013-05-14 MED ORDER — LIDOCAINE HCL (PF) 1 % IJ SOLN
INTRAMUSCULAR | Status: AC
  Filled 2013-05-14: qty 30

## 2013-05-14 NOTE — Progress Notes (Addendum)
ANTICOAGULATION CONSULT NOTE - Follow Up Consult  Pharmacy Consult for Heparin  Indication: chest pain/ACS  Allergies  Allergen Reactions  . Lipitor [Atorvastatin] Itching    Leg pain    Patient Measurements: Height: 5\' 6"  (167.6 cm) Weight: 200 lb 8 oz (90.946 kg) IBW/kg (Calculated) : 63.8 Heparin Dosing Weight: ~83 kg  Vital Signs: Temp: 97.9 F (36.6 C) (01/13 1140) Temp src: Oral (01/13 1140) BP: 141/87 mmHg (01/13 1140) Pulse Rate: 74 (01/13 1140)  Labs:  Recent Labs  05/13/13 1123 05/13/13 1226 05/13/13 1503 05/13/13 1950 05/14/13 0145 05/14/13 1113 05/14/13 1230  HGB 15.3 16.3  --   --  14.2  --   --   HCT 43.4 48.0  --   --  41.8  --   --   PLT 182  --   --   --  168  --   --   LABPROT  --   --   --   --   --  15.1  --   INR  --   --   --   --   --  1.22  --   HEPARINUNFRC  --   --   --   --  0.20*  --  0.39  CREATININE  --  2.90*  --   --  3.83*  --   --   TROPONINI  --   --  0.37* 0.41* 0.35*  --   --     Estimated Creatinine Clearance: 23 ml/min (by C-G formula based on Cr of 3.83).   Medications:  Heparin 1000 units/hr  Assessment: 58 y/o M on heparin for elevated troponin/acs. HL is 0.39. For cath today. No issues per RN. CBC trending down but within normal limits.  Goal of Therapy:  Heparin level 0.3-0.7 units/ml Monitor platelets by anticoagulation protocol: Yes   Plan:  -Continue heparin at 1250 units/hr -Follow up post cath -Daily CBC/HL -Monitor for bleeding  Erin Hearing PharmD., BCPS Clinical Pharmacist Pager [phone] 05/14/2013 3:01 PM

## 2013-05-14 NOTE — H&P (View-Only) (Signed)
ELECTROPHYSIOLOGY CONSULT NOTE    Patient ID: Michael [name] [name]: [id number], DOB/AGE: Dec 13, 1955 58 y.o.  Admit date: 05/13/2013 Date of Consult: 05-14-2013  Primary Physician: Vena Austria, MD Primary Cardiologist: new to [name]  Reason for Consultation: WCT  HPI:  [name] is a 58 year old male with a past medical history of hypertension, hyperlipidemia, ESRD - on dialysis, and hyperparathyroidism.  His dialysis days are MWF.  He was in dialysis yesterday and had finished his session and then developed tachypalpitations and pre syncope.  He did not have frank syncope.  EMS was called and he was found to be in WCT at a rate of 220 bpm.  He was given IV amiodarone which restored SR and he was brought to [facility] for further evaluation. He states that he has had these feelings before mostly on dialysis days.  The spells last for about 5 minutes and self terminate.  He has had a prior cardiac work up including stress echo which was abnormal in 2011; followed by cardiac catheterization.  Report viewed in [place] - normal coronaries, EF 50%.  Echocardiogram this admission demonstrated an EF of  25-30%, severe hypokinesis of inferior myocardium.  Lab work this admission is notable for peak troponin 0.41, K 3.1 on admission, Mg 1.7, normal TSH.  He has been placed on IV Heparin and IV Amiodarone.  His HD access is through left upper arm AV fistula that has been used for about a year.   Today, he reports intermittent chest pressure.  He denies shortness of breath or further palpitations.    EP has been asked to evaluate for treatment options.  ROS is negative except as outlined above.   Past Medical History  Diagnosis Date  . Hypertension   . Hyperparathyroidism, secondary renal   . Hyperlipidemia   . Fatigue   . Chronic kidney disease     Polycystic kidney disease  . Kidney stones     pased one, One Lazer   . ESRD (end stage renal disease) on dialysis    left av fistula in antecub; [facility] (01/22/2013)     Surgical History:  Past Surgical History  Procedure Laterality Date  . Appendectomy    . Cardiac catheterization  04-05-2010    checking for blockage but none-[facility]  . Av fistula placement  12/05/2011    Procedure: ARTERIOVENOUS (AV) FISTULA CREATION;LLEFT ARM  Surgeon: Conrad Tripp, MD;  Location: [place];  Service: Vascular;  Laterality: Left;  RADIO-CEPHALIC  fistula left arm  . Av fistula placement  01/11/2012    Procedure: ARTERIOVENOUS (AV) FISTULA CREATION;  Surgeon: Conrad [place], MD;  Location: [place];  Service: Vascular;  Laterality: Left;  Creation of left brachial cephalic arteriovenous fistula  . Umbilical hernia repair  03/20/2012    Procedure: HERNIA REPAIR UMBILICAL ADULT;  Surgeon: [name], MD;  Location: [place];  Service: General;  Laterality: N/A;  . Insertion of mesh  03/20/2012    Procedure: INSERTION OF MESH;  UMB Surgeon: [name], MD;  Location: [place];  Service: General;  Laterality: N/A;  . Hernia repair    . Laparotomy  04/02/2012    Procedure: EXPLORATORY LAPAROTOMY;  Surgeon: [name], MD;  Location: [facility] OR;  Service: General;  Laterality: N/A;  Exploratory Laparotomy with resection of small intestine  . Tonsillectomy    . Umbilical hernia repair  01/22/2013    preperitoneal open procedure due to significant adhesions/notes 01/22/2013  . Cystoscopy w/ stone  manipulation      "laser once" (01/22/2013)  . Ventral hernia repair N/A 01/22/2013    Procedure: ATTEMPTED LAPAROSCOPIC VENTRAL HERNIA CONVERTED TO OPEN;  Surgeon: [name], MD;  Location: [place];  Service: General;  Laterality: N/A;  . Insertion of mesh N/A 01/22/2013    Procedure: INSERTION OF MESH;  Surgeon: [name], MD;  Location: [place];  Service: General;  Laterality: N/A;     Prescriptions prior to admission  Medication Sig Dispense Refill  . atorvastatin (LIPITOR) 20 MG tablet Take 20 mg by mouth  daily.      [name] b complex-vitamin c-folic acid (NEPHRO-VITE) 0.8 MG TABS tablet Take 1 tablet by mouth daily.      . metoprolol succinate (TOPROL-XL) 50 MG 24 hr tablet Take 50 mg by mouth daily. Take with or immediately following a meal.        Inpatient Medications:  . aspirin EC  81 mg Oral Daily  . atorvastatin  20 mg Oral Daily  . [START ON 05/15/2013] doxercalciferol  2 mcg Intravenous Q M,W,F-HD  . metoprolol succinate  50 mg Oral Daily  . multivitamin  1 tablet Oral QHS  . sodium chloride  3 mL Intravenous Q12H    Allergies:  Allergies  Allergen Reactions  . Lipitor [Atorvastatin] Itching    Leg pain    History   Social History  . Marital Status: Married    Spouse Name: N/A    Number of Children: N/A  . Years of Education: N/A   Occupational History  . Not on file.   Social History Main Topics  . Smoking status: Never Smoker   . Smokeless tobacco: Never Used  . Alcohol Use: No  . Drug Use: No  . Sexual Activity: Yes   Other Topics Concern  . Not on file   Social History Narrative  . No narrative on file     Family History  Problem Relation Age of Onset  . Heart disease Mother   . Hyperlipidemia Mother   . Hypertension Mother   . Kidney disease Father   . Stroke Father   . Kidney disease Brother     Physical Exam: Filed Vitals:   05/13/13 2359 05/14/13 0353 05/14/13 0744 05/14/13 0800  BP: 125/82 142/82 129/82   Pulse: 75 68 71   Temp: 98.5 F (36.9 C) 97.8 F (36.6 C) 98.5 F (36.9 C)   TempSrc: Oral Oral Oral   Resp: 28 18 18 17   Height:      Weight:      SpO2: 97% 95% 96%     GEN- The patient is well appearing, alert and oriented x 3 today.   Head- normocephalic, atraumatic Eyes-  Sclera clear, conjunctiva pink Ears- hearing intact Oropharynx- clear Neck- supple  Lungs- Clear to ausculation bilaterally, normal work of breathing Heart- Regular rate and rhythm, no murmurs, rubs or gallops, PMI not laterally displaced GI- soft, NT,  ND, + BS Extremities- no clubbing, cyanosis, or edema MS- no significant deformity or atrophy Skin- no rash or lesion Psych- euthymic mood, full affect Neuro- strength and sensation are intact   Labs:   Lab Results  Component Value Date   WBC 5.3 05/14/2013   HGB 14.2 05/14/2013   HCT 41.8 05/14/2013   MCV 85.0 05/14/2013   PLT 168 05/14/2013     Recent Labs Lab 05/14/13 0145  NA 139  K 3.7  CL 99  CO2 28  BUN 17  CREATININE 3.83*  CALCIUM 8.0*  GLUCOSE 94   Lab Results  Component Value Date   CKTOTAL 369* 08/16/2008   CKMB 2.6 08/16/2008   TROPONINI 0.35* 05/14/2013    Radiology/Studies: Dg Chest Portable 1 View 05/13/2013   CLINICAL DATA:  Tachycardia.  EXAM: PORTABLE CHEST - 1 VIEW  COMPARISON:  01/15/2013  FINDINGS: 1103 hrs. Low volume film. No edema or focal airspace consolidation. Mild basilar atelectasis evident. The cardio pericardial silhouette is enlarged. Telemetry leads overlie the chest. Imaged bony structures of the thorax are intact.  IMPRESSION: Basilar atelectasis without overt edema or focal lung consolidation. .   Electronically Signed   By: Misty Stanley M.D.   On: 05/13/2013 11:32   EKG:  Sinus rhythm with PVCs, LVH, QRS 120, Qtc 496  TELEMETRY: SR with PVC's, no further sustained ventricular ectopy  A/P  1. Wide complex tachycardia I have reviewed strips in the medical chart which reveal VT at 280 msec.  Continue Amiodarone. Cath pending.  Will consider ICD if no intervention is required.  Given dialysis access, he may benefit from a subcutaneous device.  I will discuss with Dr Caryl Comes.  2.  Acute systolic dysfunction He reports recent chest tightness.  Given newly depressed EF, I would recommend cath. Risks, benefits, and alternatives to cath were discussed at length with the patient who wishes to proceed. Will proceed with cath later today.  3. ESRD Nephrology is aware  Further CV risk stratification deferred until after cath

## 2013-05-14 NOTE — CV Procedure (Signed)
     Left Heart Catheterization with Coronary Angiography  Report  Michael [name]  58 y.o.  male 06-Sep-1955  Procedure Date: 05/14/2013  Referring Physician: Rayann Heman, MD  Primary Cardiologist::  Lilian Kapur, MD  INDICATIONS:  Sustained VT and recent decline in LVEF  PROCEDURE: 1. Left heart catheterization; 2. Selective coronary angiography; 3. Left ventriculography  CONSENT:  The risks, benefits, and details of the procedure were explained in detail to the patient. Risks including death, stroke, heart attack, kidney injury, allergy, limb ischemia, bleeding and radiation injury were discussed.  The patient verbalized understanding and wanted to proceed.  Informed written consent was obtained.  PROCEDURE TECHNIQUE:  After Xylocaine anesthesia a 6 French sheath was placed in the right femoral artery with a single anterior needle wall stick.  Prior to one femoral, we attempted to cannulate the right radial but was unable to attain access despite hitting the artery twice. On each occasion we will unable to advance the guidewire. Coronary angiography was done using a 4 cm left and right Judkins catheters.  Left ventriculography was done using the JR 4 catheter and hand injection.   Hydralazine 10 mg IV x2 was given because of severe blood pressure elevation during the case.  Right groin hemostasis with Angio-Seal.  MEDICATIONS: Versed 3 mg IV and fentanyl 100 mcg IV  CONTRAST:  Total of 70 cc.  COMPLICATIONS:  Nausea   HEMODYNAMICS:  Aortic pressure 171/99 mmHg 170/24 mmHg; LV pressure 42 mm mercury  ANGIOGRAPHIC DATA:   The left main coronary artery is widely patent.  The left anterior descending artery is widely patent with minimal luminal irregularities.  The left circumflex artery is widely patent with no significant obstruction noted.  The right coronary artery is widely patent and dominant.   LEFT VENTRICULOGRAM:  Left ventricular angiogram was done in the 30 RAO  projection and revealed dilated cavity with global hypokinesis and EF less than 25%   IMPRESSIONS:  1. Nonischemic cardiomyopathy with reduced EF less than 25% and severely elevated left ventricular end-diastolic pressures  2. Widely patent coronary arteries   RECOMMENDATION:  IV fluids should be turned off. Watch for evidence of heart failure Continue IV amiodarone [facility] heart failure therapy .

## 2013-05-14 NOTE — Care Management Note (Addendum)
    Page 1 of 1   05/17/2013     9:35:59 AM   CARE MANAGEMENT NOTE 05/17/2013  Patient:  Michael [name], Michael [name]   Account Number:  [id number]  Date Initiated:  05/14/2013  Documentation initiated by:  Elissa Hefty  Subjective/Objective Assessment:   adm w v tach     Action/Plan:   lives w wife, pcp dr Herbie [place] reade   Anticipated DC Date:     Anticipated DC Plan:           Choice offered to / List presented to:     DME arranged  VEST - LIFE VEST      DME agency  OTHER - SEE NOTE        Status of service:   Medicare Important Message given?   (If response is "NO", the following Medicare IM given date fields will be blank) Date Medicare IM given:   Date Additional Medicare IM given:    Discharge Disposition:    Per UR Regulation:  Reviewed for med. necessity/level of care/duration of stay  If discussed at [place] of Stay Meetings, dates discussed:    Comments:  1/16 0934 debbie Karington Zarazua rn,bsn spoke w Lambert Keto lanier w [employer] and she is working on [employer]. once approved she will prob fit tonight.

## 2013-05-14 NOTE — Progress Notes (Signed)
ANTICOAGULATION CONSULT NOTE - Follow Up Consult  Pharmacy Consult for heparin Indication: chest pain s/p cath  Allergies  Allergen Reactions  . Lipitor [Atorvastatin] Itching    Leg pain    Patient Measurements: Height: 5\' 6"  (167.6 cm) Weight: 200 lb 8 oz (90.946 kg) IBW/kg (Calculated) : 63.8 Heparin Dosing Weight: 83 kg  Vital Signs: Temp: 97.9 F (36.6 C) (01/13 1140) Temp src: Oral (01/13 1140) BP: 147/87 mmHg (01/13 1600) Pulse Rate: 74 (01/13 1140)  Labs:  Recent Labs  05/13/13 1123 05/13/13 1226 05/13/13 1503 05/13/13 1950 05/14/13 0145 05/14/13 1113 05/14/13 1230  HGB 15.3 16.3  --   --  14.2  --   --   HCT 43.4 48.0  --   --  41.8  --   --   PLT 182  --   --   --  168  --   --   LABPROT  --   --   --   --   --  15.1  --   INR  --   --   --   --   --  1.22  --   HEPARINUNFRC  --   --   --   --  0.20*  --  0.39  CREATININE  --  2.90*  --   --  3.83*  --   --   TROPONINI  --   --  0.37* 0.41* 0.35*  --   --     Estimated Creatinine Clearance: 23 ml/min (by C-G formula based on Cr of 3.83).   Medications:  Scheduled:  . aspirin EC  81 mg Oral Daily  . atorvastatin  20 mg Oral Daily  . [START ON 05/15/2013] doxercalciferol  2 mcg Intravenous Q M,W,F-HD  . metoprolol succinate  50 mg Oral Daily  . multivitamin  1 tablet Oral QHS   Infusions:  . sodium chloride    . amiodarone (NEXTERONE PREMIX) 360 mg/200 mL dextrose 30 mg/hr (05/14/13 1600)    Assessment: 58 yo male with chest pain s/p cath will be restarted on heparin 8hrs post sheath removal.  Per RN, sheath was removed around 1715.  Patient was on heparin at 1250 units/hr with a therapeutic heparin level prior to cath. Goal of Therapy:  Heparin level 0.3-0.7 units/ml Monitor platelets by anticoagulation protocol: Yes   Plan:  1) Restart heparin at 1250 units/hr at 0115 on 05/15/13. No bolus 2) 8hr heparin level after drip is restarted. 3) Daily heparin level and CBC  Samel Bruna,  Tsz-Yin 05/14/2013,6:02 PM

## 2013-05-14 NOTE — ED Provider Notes (Signed)
I saw and evaluated the patient, reviewed the resident's note and I agree with the findings and plan.  EKG Interpretation    Date/Time:  Monday May 13 2013 11:01:14 EST Ventricular Rate:  91 PR Interval:  187 QRS Duration: 124 QT Interval:  427 QTC Calculation: 525 R Axis:   -40 Text Interpretation:  Sinus rhythm Ventricular premature complex Nonspecific IVCD with LAD Left ventricular hypertrophy Similar to prior Confirmed by Mingo Amber  MD, [place] ([id number]) on 05/13/2013 11:10:00 AM           CRITICAL CARE Performed by: Osvaldo Shipper   Total critical care time: 30 minutes  Critical care time was exclusive of separately billable procedures and treating other patients.  Critical care was necessary to treat or prevent imminent or life-threatening deterioration.  Critical care was time spent personally by me on the following activities: development of treatment plan with patient and/or surrogate as well as nursing, discussions with consultants, evaluation of patient's response to treatment, examination of patient, obtaining history from patient or surrogate, ordering and performing treatments and interventions, ordering and review of laboratory studies, ordering and review of radiographic studies, pulse oximetry and re-evaluation of patient's condition.  He is here for or ventricular tachycardia. Patient had ventricular tachycardia during dialysis. He is hypertensive at that point. He had a true long run of V. tach with EMS. Patient never lost pulses. Patient states he's feeling fine and hasn't had chest pain or shortness of breath. In the interim started on arrival. Exam is benign. Magnesium is normal. Patient admitted to cardiac  Osvaldo Shipper, MD 05/14/13 1540

## 2013-05-14 NOTE — Consult Note (Addendum)
ELECTROPHYSIOLOGY CONSULT NOTE    Patient ID: Michael [name] [name]: [id number], DOB/AGE: 56/05/1955 56 y.o.  Admit date: 05/13/2013 Date of Consult: 05-14-2013  Primary Physician: Vena Austria, MD Primary Cardiologist: new to [facility]  Reason for Consultation: WCT  HPI:  Michael [name] is a 58 year old male with a past medical history of hypertension, hyperlipidemia, ESRD - on dialysis, and hyperparathyroidism.  His dialysis days are MWF.  He was in dialysis yesterday and had finished his session and then developed tachypalpitations and pre syncope.  He did not have frank syncope.  EMS was called and he was found to be in WCT at a rate of 220 bpm.  He was given IV amiodarone which restored SR and he was brought to [facility] for further evaluation. He states that he has had these feelings before mostly on dialysis days.  The spells last for about 5 minutes and self terminate.  He has had a prior cardiac work up including stress echo which was abnormal in 2011; followed by cardiac catheterization.  Report viewed in [place] - normal coronaries, EF 50%.  Echocardiogram this admission demonstrated an EF of  25-30%, severe hypokinesis of inferior myocardium.  Lab work this admission is notable for peak troponin 0.41, K 3.1 on admission, Mg 1.7, normal TSH.  He has been placed on IV Heparin and IV Amiodarone.  His HD access is through left upper arm AV fistula that has been used for about a year.   Today, he reports intermittent chest pressure.  He denies shortness of breath or further palpitations.    EP has been asked to evaluate for treatment options.  ROS is negative except as outlined above.   Past Medical History  Diagnosis Date  . Hypertension   . Hyperparathyroidism, secondary renal   . Hyperlipidemia   . Fatigue   . Chronic kidney disease     Polycystic kidney disease  . Kidney stones     pased one, [place]   . ESRD (end stage renal disease) on dialysis    left av fistula in antecub; [facility] (01/22/2013)     Surgical History:  Past Surgical History  Procedure Laterality Date  . Appendectomy    . Cardiac catheterization  04-05-2010    checking for blockage but none-[facility]  . Av fistula placement  12/05/2011    Procedure: ARTERIOVENOUS (AV) FISTULA CREATION;LLEFT ARM  Surgeon: Conrad Weston, MD;  Location: [place];  Service: Vascular;  Laterality: Left;  RADIO-CEPHALIC  fistula left arm  . Av fistula placement  01/11/2012    Procedure: ARTERIOVENOUS (AV) FISTULA CREATION;  Surgeon: Conrad Oak Lawn, MD;  Location: [place];  Service: Vascular;  Laterality: Left;  Creation of left brachial cephalic arteriovenous fistula  . Umbilical hernia repair  03/20/2012    Procedure: HERNIA REPAIR UMBILICAL ADULT;  Surgeon: [name], MD;  Location: [place];  Service: General;  Laterality: N/A;  . Insertion of mesh  03/20/2012    Procedure: INSERTION OF MESH;  UMB Surgeon: [name], MD;  Location: [place];  Service: General;  Laterality: N/A;  . Hernia repair    . Laparotomy  04/02/2012    Procedure: EXPLORATORY LAPAROTOMY;  Surgeon: [name], MD;  Location: [facility] OR;  Service: General;  Laterality: N/A;  Exploratory Laparotomy with resection of small intestine  . Tonsillectomy    . Umbilical hernia repair  01/22/2013    preperitoneal open procedure due to significant adhesions/notes 01/22/2013  . Cystoscopy w/ stone  manipulation      "laser once" (01/22/2013)  . Ventral hernia repair N/A 01/22/2013    Procedure: ATTEMPTED LAPAROSCOPIC VENTRAL HERNIA CONVERTED TO OPEN;  Surgeon: [name], MD;  Location: [place];  Service: General;  Laterality: N/A;  . Insertion of mesh N/A 01/22/2013    Procedure: INSERTION OF MESH;  Surgeon: [name], MD;  Location: [place];  Service: General;  Laterality: N/A;     Prescriptions prior to admission  Medication Sig Dispense Refill  . atorvastatin (LIPITOR) 20 MG tablet Take 20 mg by mouth  daily.      [name] b complex-vitamin c-folic acid (NEPHRO-VITE) 0.8 MG TABS tablet Take 1 tablet by mouth daily.      . metoprolol succinate (TOPROL-XL) 50 MG 24 hr tablet Take 50 mg by mouth daily. Take with or immediately following a meal.        Inpatient Medications:  . aspirin EC  81 mg Oral Daily  . atorvastatin  20 mg Oral Daily  . [START ON 05/15/2013] doxercalciferol  2 mcg Intravenous Q M,W,F-HD  . metoprolol succinate  50 mg Oral Daily  . multivitamin  1 tablet Oral QHS  . sodium chloride  3 mL Intravenous Q12H    Allergies:  Allergies  Allergen Reactions  . Lipitor [Atorvastatin] Itching    Leg pain    History   Social History  . Marital Status: Married    Spouse Name: N/A    Number of Children: N/A  . Years of Education: N/A   Occupational History  . Not on file.   Social History Main Topics  . Smoking status: Never Smoker   . Smokeless tobacco: Never Used  . Alcohol Use: No  . Drug Use: No  . Sexual Activity: Yes   Other Topics Concern  . Not on file   Social History Narrative  . No narrative on file     Family History  Problem Relation Age of Onset  . Heart disease Mother   . Hyperlipidemia Mother   . Hypertension Mother   . Kidney disease Father   . Stroke Father   . Kidney disease Brother     Physical Exam: Filed Vitals:   05/13/13 2359 05/14/13 0353 05/14/13 0744 05/14/13 0800  BP: 125/82 142/82 129/82   Pulse: 75 68 71   Temp: 98.5 F (36.9 C) 97.8 F (36.6 C) 98.5 F (36.9 C)   TempSrc: Oral Oral Oral   Resp: 28 18 18 17   Height:      Weight:      SpO2: 97% 95% 96%     GEN- The patient is well appearing, alert and oriented x 3 today.   Head- normocephalic, atraumatic Eyes-  Sclera clear, conjunctiva pink Ears- hearing intact Oropharynx- clear Neck- supple  Lungs- Clear to ausculation bilaterally, normal work of breathing Heart- Regular rate and rhythm, no murmurs, rubs or gallops, PMI not laterally displaced GI- soft, NT,  ND, + BS Extremities- no clubbing, cyanosis, or edema MS- no significant deformity or atrophy Skin- no rash or lesion Psych- euthymic mood, full affect Neuro- strength and sensation are intact   Labs:   Lab Results  Component Value Date   WBC 5.3 05/14/2013   HGB 14.2 05/14/2013   HCT 41.8 05/14/2013   MCV 85.0 05/14/2013   PLT 168 05/14/2013     Recent Labs Lab 05/14/13 0145  NA 139  K 3.7  CL 99  CO2 28  BUN 17  CREATININE 3.83*  CALCIUM 8.0*  GLUCOSE 94   Lab Results  Component Value Date   CKTOTAL 369* 08/16/2008   CKMB 2.6 08/16/2008   TROPONINI 0.35* 05/14/2013    Radiology/Studies: Dg Chest Portable 1 View 05/13/2013   CLINICAL DATA:  Tachycardia.  EXAM: PORTABLE CHEST - 1 VIEW  COMPARISON:  01/15/2013  FINDINGS: 1103 hrs. Low volume film. No edema or focal airspace consolidation. Mild basilar atelectasis evident. The cardio pericardial silhouette is enlarged. Telemetry leads overlie the chest. Imaged bony structures of the thorax are intact.  IMPRESSION: Basilar atelectasis without overt edema or focal lung consolidation. .   Electronically Signed   By: Misty Stanley M.D.   On: 05/13/2013 11:32   EKG:  Sinus rhythm with PVCs, LVH, QRS 120, Qtc 496  TELEMETRY: SR with PVC's, no further sustained ventricular ectopy  A/P  1. Wide complex tachycardia I have reviewed strips in the medical chart which reveal VT at 280 msec.  Continue Amiodarone. Cath pending.  Will consider ICD if no intervention is required.  Given dialysis access, he may benefit from a subcutaneous device.  I will discuss with Dr Caryl Comes.  2.  Acute systolic dysfunction He reports recent chest tightness.  Given newly depressed EF, I would recommend cath. Risks, benefits, and alternatives to cath were discussed at length with the patient who wishes to proceed. Will proceed with cath later today.  3. ESRD Nephrology is aware  Further CV risk stratification deferred until after cath

## 2013-05-14 NOTE — Progress Notes (Signed)
  [facility] Progress Note   Subjective: No VT overnight, EP has seen patient, for heart cath today  Filed Vitals:   05/14/13 0800 05/14/13 1000 05/14/13 1053 05/14/13 1140  BP:  147/86 147/86 141/87  Pulse:   72 74  Temp:    97.9 F (36.6 C)  TempSrc:    Oral  Resp: 17 18  19   Height:      Weight:      SpO2:    98%  Exam Alert, no distress, lying flat No jvd Chest clear bilat RRR no MRG Abd soft, nt/nd, no ascites, well healed scars No LE or UE edema Left upper arm AVF patent Neuro is alert, nf, ox3  Dialysis Orders: MWF @ [facility]  88.5 kg 4hr 16min 2K/2Ca Heparin 8400 LUA AVF 450/A1.5  Hectorol 14mcg Epogen none Venofer none  PTH 198, Hb 13.9, tsat 23% ferr 122, Ca/P ok w/o binders  Assess/Rec: 1. Ventricular tachycardia: symptomatic, resolved with IV amio. On IV amio and heparin, for heart cath today and EP team evaluating.  2. ESRD: HD tomorrow 3. HTN/volume: 2kg up, no vol excess, on metoprolol 50/d as at home 4. Anemia: Hb up, no esa 5. MBD/CKD: cont vit D, no binders needed 6. HL: statin  Kelly Splinter MD pager [phone]    cell [phone] 05/14/2013, 12:32 PM   Recent Labs Lab 05/13/13 1226 05/14/13 0145  NA 139 139  K 3.1* 3.7  CL 98 99  CO2  --  28  GLUCOSE 115* 94  BUN 13 17  CREATININE 2.90* 3.83*  CALCIUM  --  8.0*   No results found for this basename: AST, ALT, ALKPHOS, BILITOT, PROT, ALBUMIN,  in the last 168 hours  Recent Labs Lab 05/13/13 1123 05/13/13 1226 05/14/13 0145  WBC 4.8  --  5.3  NEUTROABS 3.4  --   --   HGB 15.3 16.3 14.2  HCT 43.4 48.0 41.8  MCV 81.6  --  85.0  PLT 182  --  168   . aspirin EC  81 mg Oral Daily  . atorvastatin  20 mg Oral Daily  . [START ON 05/15/2013] doxercalciferol  2 mcg Intravenous Q M,W,F-HD  . metoprolol succinate  50 mg Oral Daily  . multivitamin  1 tablet Oral QHS  . sodium chloride  3 mL Intravenous Q12H   . amiodarone (NEXTERONE PREMIX) 360 mg/200 mL dextrose 30 mg/hr (05/14/13  0438)  . heparin 1,250 Units/hr (05/14/13 0436)   sodium chloride, acetaminophen, nitroGLYCERIN, ondansetron (ZOFRAN) IV, sodium chloride

## 2013-05-14 NOTE — Interval H&P Note (Signed)
Cath Lab Visit (complete for each Cath Lab visit)  Clinical Evaluation Leading to the Procedure:   ACS: no  Non-ACS:    Anginal Classification: CCS II  Anti-ischemic medical therapy: No Therapy  Non-Invasive Test Results: No non-invasive testing performed  Prior CABG: No previous CABG      History and Physical Interval Note:  05/14/2013 12:12 PM  Michael [name]  has presented today for surgery, with the diagnosis of cp  The various methods of treatment have been discussed with the patient and family. After consideration of risks, benefits and other options for treatment, the patient has consented to  Procedure(s): LEFT HEART CATHETERIZATION WITH CORONARY ANGIOGRAM (N/A) as a surgical intervention .  The patient's history has been reviewed, patient examined, no change in status, stable for surgery.  I have reviewed the patient's chart and labs.  Questions were answered to the patient's satisfaction.    Undergoing diagnostic cath to rule out the possibility of coronary artery disease contributing to recent significant decrease in LVEF and the development of monomorphic ventricular tachycardia. Cardiac catheterization in 2011 did not demonstrate significant coronary disease. Sinclair Grooms

## 2013-05-14 NOTE — Progress Notes (Signed)
ANTICOAGULATION CONSULT NOTE - Follow Up Consult  Pharmacy Consult for Heparin  Indication: chest pain/ACS  Allergies  Allergen Reactions  . Lipitor [Atorvastatin] Itching    Leg pain    Patient Measurements: Height: 5\' 6"  (167.6 cm) Weight: 200 lb 8 oz (90.946 kg) IBW/kg (Calculated) : 63.8 Heparin Dosing Weight: ~83 kg  Vital Signs: Temp: 97.8 F (36.6 C) (01/13 0353) Temp src: Oral (01/13 0353) BP: 125/82 mmHg (01/12 2359) Pulse Rate: 75 (01/12 2359)  Labs:  Recent Labs  05/13/13 1123 05/13/13 1226 05/13/13 1503 05/13/13 1950 05/14/13 0145  HGB 15.3 16.3  --   --  14.2  HCT 43.4 48.0  --   --  41.8  PLT 182  --   --   --  168  HEPARINUNFRC  --   --   --   --  0.20*  CREATININE  --  2.90*  --   --  3.83*  TROPONINI  --   --  0.37* 0.41* 0.35*    Estimated Creatinine Clearance: 23 ml/min (by C-G formula based on Cr of 3.83).   Medications:  Heparin 1000 units/hr  Assessment: 58 y/o M on heparin for elevated troponin. HL is 0.20. Likely for cath today. No issues per RN.   Goal of Therapy:  Heparin level 0.3-0.7 units/ml Monitor platelets by anticoagulation protocol: Yes   Plan:  -Increase heparin to 1250 units/hr -8 hour HL at 1200, pending cath -Daily CBC/HL, pending cath -Monitor for bleeding  Narda Bonds 05/14/2013,3:55 AM

## 2013-05-15 ENCOUNTER — Encounter (HOSPITAL_COMMUNITY)
Admission: EM | Disposition: A | Discharge status: Home / Self Care | Payer: Self-pay | Source: Home / Self Care | Attending: Cardiology

## 2013-05-15 ENCOUNTER — Encounter (HOSPITAL_COMMUNITY): Payer: Self-pay | Admitting: Internal Medicine

## 2013-05-15 DIAGNOSIS — I493 Ventricular premature depolarization: Secondary | ICD-10-CM

## 2013-05-15 DIAGNOSIS — I428 Other cardiomyopathies: Secondary | ICD-10-CM | POA: Diagnosis present

## 2013-05-15 DIAGNOSIS — Z992 Dependence on renal dialysis: Secondary | ICD-10-CM

## 2013-05-15 DIAGNOSIS — N186 End stage renal disease: Secondary | ICD-10-CM | POA: Diagnosis present

## 2013-05-15 LAB — CBC
HCT: 44.5 % (ref 39.0–52.0)
Hemoglobin: 15.8 g/dL (ref 13.0–17.0)
MCH: 29.2 pg (ref 26.0–34.0)
MCHC: 35.5 g/dL (ref 30.0–36.0)
MCV: 82.1 fL (ref 78.0–100.0)
Platelets: 204 10*3/uL (ref 150–400)
RBC: 5.42 MIL/uL (ref 4.22–5.81)
RDW: 13.7 % (ref 11.5–15.5)
WBC: 10.7 10*3/uL — ABNORMAL HIGH (ref 4.0–10.5)

## 2013-05-15 LAB — HEPARIN LEVEL (UNFRACTIONATED): Heparin Unfractionated: <0.1 IU/mL — ABNORMAL LOW (ref 0.30–0.70)

## 2013-05-15 SURGERY — IMPLANTABLE CARDIOVERTER DEFIBRILLATOR IMPLANT
Anesthesia: LOCAL

## 2013-05-15 NOTE — Progress Notes (Signed)
SUBJECTIVE: The patient is doing well today.  At this time, he denies chest pain, shortness of breath, or any new concerns.  S/p LHC yesterday - widely patent coronary arteries, EF <25%.    CURRENT MEDICATIONS: . aspirin EC  81 mg Oral Daily  . atorvastatin  20 mg Oral Daily  . doxercalciferol  2 mcg Intravenous Q M,W,F-HD  . metoprolol succinate  50 mg Oral Daily  . multivitamin  1 tablet Oral QHS   . sodium chloride 10 mL/hr at 05/14/13 1800  . amiodarone (NEXTERONE PREMIX) 360 mg/200 mL dextrose 30 mg/hr (05/15/13 0216)  . heparin 1,250 Units/hr (05/15/13 0117)    OBJECTIVE: Physical Exam: Well developed and nourished in no acute distress HENT normal Neck supple with JVP-flat Clear Regular rate and rhythm, 2/6 murmur Abd-soft with active BS No Clubbing cyanosis edema Skin-warm and dry A & Oriented  Grossly normal sensory and motor function   Intake/Output Summary (Last 24 hours) at 05/15/13 1342 Last data filed at 05/15/13 1200  Gross per 24 hour  Intake 1067.4 ml  Output   1075 ml  Net   -7.6 ml    Telemetry reveals sinus rhythm, PVC's, short run NSVT   LABS: Basic Metabolic Panel:  Recent Labs  05/13/13 1123 05/13/13 1226 05/14/13 0145  NA  --  139 139  K  --  3.1* 3.7  CL  --  98 99  CO2  --   --  28  GLUCOSE  --  115* 94  BUN  --  13 17  CREATININE  --  2.90* 3.83*  CALCIUM  --   --  8.0*  MG 1.7  --  1.7   CBC:  Recent Labs  05/13/13 1123  05/14/13 0145 05/15/13 0221  WBC 4.8  --  5.3 10.7*  NEUTROABS 3.4  --   --   --   HGB 15.3  < > 14.2 15.8  HCT 43.4  < > 41.8 44.5  MCV 81.6  --  85.0 82.1  PLT 182  --  168 204  < > = values in this interval not displayed. Cardiac Enzymes:  Recent Labs  05/13/13 1503 05/13/13 1950 05/14/13 0145  TROPONINI 0.37* 0.41* 0.35*   Thyroid Function Tests:  Recent Labs  05/13/13 1503  TSH 0.525    RADIOLOGY: Dg Chest Portable 1 View 05/13/2013   CLINICAL DATA:  Tachycardia.  EXAM:  PORTABLE CHEST - 1 VIEW  COMPARISON:  01/15/2013  FINDINGS: 1103 hrs. Low volume film. No edema or focal airspace consolidation. Mild basilar atelectasis evident. The cardio pericardial silhouette is enlarged. Telemetry leads overlie the chest. Imaged bony structures of the thorax are intact.  IMPRESSION: Basilar atelectasis without overt edema or focal lung consolidation. .   Electronically Signed   By: Misty Stanley M.D.   On: 05/13/2013 11:32    ASSESSMENT AND PLAN:  Principal Problem:   VT (ventricular tachycardia) Active Problems:   Nonischemic cardiomyopathy   ESRD (end stage renal disease) on dialysis   PVC's // RBBB sup Axis  Complicated series of issues What is the cause of the rapid onset of LV dysfunction.  It is concurrent with the onset of dialysis but also occurring in the context of freq broad PVCs, raising the question at tow hethe they might be reversible with control of PVC thereby obviating the need for ICD  HD with concerns of axis makes an ICD a little less attractive an option, but not a great candidate  for S-ICD as he has monomorphic VT which is possibly pace termniated ( although it may be automatic given its association with PVCs) He is at increased risk of SCD which may be transient if there is possible resolution of his cardiomyopathy   Based on the above will therefore we'll 1. Continue amiodarone for management of ventricular tachycardia and hopefully suppression of PVCs. 2. Consider adjunctive mexiletine 3. Thallium rest scan to look for evidence of disrupted/scarred myocardium which might portend a worse prognosis 4 anticipate discharge with a LifeVest while we try to see whether there'll be interval recovery of left ventricular function 5. Consider catheter ablation given the high burden of ventricular ectopy with a highly dominant morphology the origin appearing to be in the septal floor of the left ventricle

## 2013-05-15 NOTE — Progress Notes (Signed)
  [facility] Progress Note   Subjective: No further VT, severely dilated CM by cath, very high filling pressures also  Filed Vitals:   05/15/13 0000 05/15/13 0200 05/15/13 0400 05/15/13 0751  BP: 130/57 131/77 141/72 151/87  Pulse: 70  69 72  Temp: 98 F (36.7 C)  98.1 F (36.7 C) 98.5 F (36.9 C)  TempSrc: Oral  Oral Oral  Resp: 15  16 18   Height:      Weight:   91.1 kg (200 lb 13.4 oz)   SpO2: 92%  96% 99%  Exam Alert, no distress, lying flat No jvd Chest clear bilat RRR no MRG Abd soft, nt/nd, no ascites, well healed scars No LE or UE edema Left upper arm AVF patent Neuro is alert, nf, ox3  Dialysis Orders: MWF @ [facility]  88.5 kg 4hr 46min 2K/2Ca Heparin 8400 LUA AVF 450/A1.5  Hectorol 21mcg Epogen none Venofer none  PTH 198, Hb 13.9, tsat 23% ferr 122, Ca/P ok w/o binders  Assess/Rec: 1. Ventricular tachycardia: resolved with IV amio. Severe dilated cardiomyopathy by cath, EF 25% with very high filling pressures.  2. ESRD: HD today 3. HTN/volume: on metoprolol 50/d as at home; BP high normal w very high filling pressures by cath yesterday, need to challenge volume and prob lower dry wt. Max UF with HD today 4. Anemia: Hb up, no esa 5. MBD/CKD: cont vit D, no binders needed  Kelly Splinter MD pager [phone]    cell [phone] 05/15/2013, 10:48 AM   Recent Labs Lab 05/13/13 1226 05/14/13 0145  NA 139 139  K 3.1* 3.7  CL 98 99  CO2  --  28  GLUCOSE 115* 94  BUN 13 17  CREATININE 2.90* 3.83*  CALCIUM  --  8.0*   No results found for this basename: AST, ALT, ALKPHOS, BILITOT, PROT, ALBUMIN,  in the last 168 hours  Recent Labs Lab 05/13/13 1123 05/13/13 1226 05/14/13 0145 05/15/13 0221  WBC 4.8  --  5.3 10.7*  NEUTROABS 3.4  --   --   --   HGB 15.3 16.3 14.2 15.8  HCT 43.4 48.0 41.8 44.5  MCV 81.6  --  85.0 82.1  PLT 182  --  168 204   . aspirin EC  81 mg Oral Daily  . atorvastatin  20 mg Oral Daily  . doxercalciferol  2 mcg Intravenous  Q M,W,F-HD  . metoprolol succinate  50 mg Oral Daily  . multivitamin  1 tablet Oral QHS   . sodium chloride 10 mL/hr at 05/14/13 1800  . amiodarone (NEXTERONE PREMIX) 360 mg/200 mL dextrose 30 mg/hr (05/15/13 0216)  . heparin 1,250 Units/hr (05/15/13 0117)   sodium chloride, sodium chloride, acetaminophen, feeding supplement (NEPRO CARB STEADY), heparin, lidocaine (PF), lidocaine-prilocaine, nitroGLYCERIN, ondansetron (ZOFRAN) IV, pentafluoroprop-tetrafluoroeth

## 2013-05-15 NOTE — Progress Notes (Signed)
ANTICOAGULATION CONSULT NOTE - Follow Up Consult  Pharmacy Consult for heparin Indication: chest pain s/p cath  Allergies  Allergen Reactions  . Lipitor [Atorvastatin] Itching    Leg pain    Patient Measurements: Height: 5\' 6"  (167.6 cm) Weight: 200 lb 13.4 oz (91.1 kg) IBW/kg (Calculated) : 63.8 Heparin Dosing Weight: 83 kg  Vital Signs: Temp: 98.1 F (36.7 C) (01/14 1127) Temp src: Oral (01/14 1127) BP: 180/99 mmHg (01/14 1250) Pulse Rate: 83 (01/14 1250)  Labs:  Recent Labs  05/13/13 1123 05/13/13 1226 05/13/13 1503 05/13/13 1950 05/14/13 0145 05/14/13 1113 05/14/13 1230 05/15/13 0221 05/15/13 0911  HGB 15.3 16.3  --   --  14.2  --   --  15.8  --   HCT 43.4 48.0  --   --  41.8  --   --  44.5  --   PLT 182  --   --   --  168  --   --  204  --   LABPROT  --   --   --   --   --  15.1  --   --   --   INR  --   --   --   --   --  1.22  --   --   --   HEPARINUNFRC  --   --   --   --  0.20*  --  0.39  --  <0.10*  CREATININE  --  2.90*  --   --  3.83*  --   --   --   --   TROPONINI  --   --  0.37* 0.41* 0.35*  --   --   --   --     Estimated Creatinine Clearance: 23 ml/min (by C-G formula based on Cr of 3.83).   Medications:  Scheduled:  . aspirin EC  81 mg Oral Daily  . atorvastatin  20 mg Oral Daily  . doxercalciferol  2 mcg Intravenous Q M,W,F-HD  . metoprolol succinate  50 mg Oral Daily  . multivitamin  1 tablet Oral QHS   Infusions:  . sodium chloride 10 mL/hr at 05/14/13 1800  . amiodarone (NEXTERONE PREMIX) 360 mg/200 mL dextrose 30 mg/hr (05/15/13 0216)  . heparin 1,250 Units/hr (05/15/13 0117)    Assessment: 58 yo male with chest pain s/p cath was restarted on heparin 8hrs post sheath removal. Patient is currently on heparin 1250 units/hr but 8 hour heparin level was subtherapeutic at < 0.1. Heparin line seems to infusing well. No bleeding noted.   Goal of Therapy:  Heparin level 0.3-0.7 units/ml Monitor platelets by anticoagulation protocol:  Yes   Plan:  1. Increase Heparin inf to 1500 units/hr 2. F/u 8-hr HL 3. F/u HD plans today 4. F/u long-term [facility] plans   Albertina Parr, PharmD.  Clinical Pharmacist Pager [phone]

## 2013-05-15 NOTE — Progress Notes (Signed)
Claimed to feel much better now, cont. to monitor.

## 2013-05-15 NOTE — Progress Notes (Signed)
Michael [name] walked around the unit twice, no complaints, tolerated well. No chest pain, no sob noted.  Michael [name] also watched the educational video on [place], [place]

## 2013-05-15 NOTE — Progress Notes (Signed)
Refused to go for hemodialysis, claiming that he feels that his heart beating fast, bp 180/99, hr- 83 NSR with Pac's r-16/min, dose of toprol 50 mg given.Labuer PA made aware and Hemodialysis nurse made aware about the refusal of going for HD. Continue to monitor.

## 2013-05-16 LAB — CBC
HCT: 41.3 % (ref 39.0–52.0)
Hemoglobin: 14 g/dL (ref 13.0–17.0)
MCH: 28.9 pg (ref 26.0–34.0)
MCHC: 33.9 g/dL (ref 30.0–36.0)
MCV: 85.2 fL (ref 78.0–100.0)
Platelets: 157 10*3/uL (ref 150–400)
RBC: 4.85 MIL/uL (ref 4.22–5.81)
RDW: 13.7 % (ref 11.5–15.5)
WBC: 5.9 10*3/uL (ref 4.0–10.5)

## 2013-05-16 MED ORDER — DOXERCALCIFEROL 4 MCG/2ML IV SOLN
INTRAVENOUS | Status: AC
  Administered 2013-05-16: 2 ug via INTRAVENOUS
  Filled 2013-05-16: qty 2

## 2013-05-16 NOTE — Progress Notes (Signed)
  [facility] Progress Note   Subjective: Refused HD yesterday, plan HD today in CCU  Filed Vitals:   05/16/13 0400 05/16/13 0700 05/16/13 0739 05/16/13 1127  BP: 153/84  153/91 147/82  Pulse: 69  72 74  Temp:   98.3 F (36.8 C) 98.6 F (37 C)  TempSrc:   Oral Oral  Resp: 18  14 20   Height:      Weight:  91.5 kg (201 lb 11.5 oz)    SpO2: 95%  96% 98%  Exam Alert, no distress, lying flat No jvd Chest clear bilat RRR no MRG Abd soft, nt/nd, no ascites, well healed scars No LE or UE edema Left upper arm AVF patent Neuro is alert, nf, ox3  Dialysis Orders: MWF @ [facility]  88.5 kg 4hr 61min 2K/2Ca Heparin 8400 LUA AVF 450/A1.5  Hectorol 75mcg Epogen none Venofer none  PTH 198, Hb 13.9, tsat 23% ferr 122, Ca/P ok w/o binders  Assess/Rec: 1. Ventricular tachycardia: resolved with IV amio. Severe dilated cardiomyopathy by cath, EF 25% with very high filling pressures.  2. ESRD: HD today and tomorrow. Missed yesterday, pt didn't want to do HD yest 3. HTN/volume: on metoprolol 50/d as at home; BP high normal w very high filling pressures by cath yesterday, need to challenge volume as tolerated 4. Anemia: Hb up, no esa 5. MBD/CKD: cont vit D, no binders needed  Kelly Splinter MD pager [phone]    cell [phone] 05/16/2013, 12:00 PM   Recent Labs Lab 05/13/13 1226 05/14/13 0145  NA 139 139  K 3.1* 3.7  CL 98 99  CO2  --  28  GLUCOSE 115* 94  BUN 13 17  CREATININE 2.90* 3.83*  CALCIUM  --  8.0*   No results found for this basename: AST, ALT, ALKPHOS, BILITOT, PROT, ALBUMIN,  in the last 168 hours  Recent Labs Lab 05/13/13 1123  05/14/13 0145 05/15/13 0221 05/16/13 0235  WBC 4.8  --  5.3 10.7* 5.9  NEUTROABS 3.4  --   --   --   --   HGB 15.3  < > 14.2 15.8 14.0  HCT 43.4  < > 41.8 44.5 41.3  MCV 81.6  --  85.0 82.1 85.2  PLT 182  --  168 204 157  < > = values in this interval not displayed. [name] aspirin EC  81 mg Oral Daily  . atorvastatin  20 mg Oral  Daily  . doxercalciferol  2 mcg Intravenous Q M,W,F-HD  . metoprolol succinate  50 mg Oral Daily  . multivitamin  1 tablet Oral QHS   . sodium chloride 10 mL/hr at 05/14/13 1800  . amiodarone (NEXTERONE PREMIX) 360 mg/200 mL dextrose 30 mg/hr (05/15/13 1428)   sodium chloride, sodium chloride, acetaminophen, feeding supplement (NEPRO CARB STEADY), heparin, lidocaine (PF), lidocaine-prilocaine, nitroGLYCERIN, ondansetron (ZOFRAN) IV, pentafluoroprop-tetrafluoroeth

## 2013-05-16 NOTE — Progress Notes (Signed)
Patient: Michael [name] Date of Encounter: 05/16/2013, 1:10 PM Admit date: 05/13/2013     Subjective  Michael [name] has no new complaints.   Objective  Physical Exam: Vitals: BP 147/82  Pulse 74  Temp(Src) 98.6 F (37 C) (Oral)  Resp 15  Ht 5\' 6"  (1.676 m)  Wt 201 lb 11.5 oz (91.5 kg)  BMI 32.57 kg/m2  SpO2 98% General: Well developed, well appearing 58 year old male in no acute distress. Neck: Supple. JVD not elevated. Lungs: Clear bilaterally to auscultation without wheezes, rales, or rhonchi. Breathing is unlabored. Heart: Regular S1 S2 without murmurs, rubs, or gallops.  Abdomen: Soft, non-distended. Extremities: No clubbing or cyanosis. No edema.   Neuro: Alert and oriented X 3. Moves all extremities spontaneously. No focal deficits.  Intake/Output:  Intake/Output Summary (Last 24 hours) at 05/16/13 1310 Last data filed at 05/16/13 1200  Gross per 24 hour  Intake 1027.4 ml  Output    450 ml  Net  577.4 ml    Inpatient Medications:  . aspirin EC  81 mg Oral Daily  . atorvastatin  20 mg Oral Daily  . doxercalciferol      . doxercalciferol  2 mcg Intravenous Q M,W,F-HD  . metoprolol succinate  50 mg Oral Daily  . multivitamin  1 tablet Oral QHS   . sodium chloride 10 mL/hr at 05/14/13 1800  . amiodarone (NEXTERONE PREMIX) 360 mg/200 mL dextrose 30 mg/hr (05/15/13 1428)    Labs:  Recent Labs  05/14/13 0145  NA 139  K 3.7  CL 99  CO2 28  GLUCOSE 94  BUN 17  CREATININE 3.83*  CALCIUM 8.0*  MG 1.7    Recent Labs  05/15/13 0221 05/16/13 0235  WBC 10.7* 5.9  HGB 15.8 14.0  HCT 44.5 41.3  MCV 82.1 85.2  PLT 204 157    Recent Labs  05/13/13 1503 05/13/13 1950 05/14/13 0145  TROPONINI 0.37* 0.41* 0.35*    Recent Labs  05/13/13 1503  TSH 0.525    Recent Labs  05/14/13 1113  INR 1.22    Radiology/Studies: Dg Chest Portable 1 View 05/13/2013   CLINICAL DATA:  Tachycardia.  EXAM: PORTABLE CHEST - 1 VIEW  COMPARISON:  01/15/2013   FINDINGS: 1103 hrs. Low volume film. No edema or focal airspace consolidation. Mild basilar atelectasis evident. The cardio pericardial silhouette is enlarged. Telemetry leads overlie the chest. Imaged bony structures of the thorax are intact.  IMPRESSION: Basilar atelectasis without overt edema or focal lung consolidation. .   Electronically Signed   By: Misty Stanley M.D.   On: 05/13/2013 11:32   Echocardiogram Study Conclusions - Left ventricle: The cavity size was moderately dilated.   Wall thickness was normal. Systolic function was severely   reduced. The estimated ejection fraction was in the range   of 25% to 30%. There is severe hypokinesis of the   entireinferior myocardium. Left ventricular diastolic   function parameters were normal. - Aortic valve: Mild regurgitation. - Mitral valve: Mild regurgitation. - Left atrium: The atrium was mildly dilated. - Right atrium: The atrium was mildly dilated.  Telemetry:    Assessment and Plan  1. Sustained VT with frequent ventricular ectopy - improved on amiodarone - thallium rest scan tomorrow for myocardial viability - Dr. Caryl Comes considering PVC ablation and LifeVest while waiting for LVEF to recover; Dr. Lovena Le to weigh in with recommendations today 2. NICM, EF <25% - newly diagnosed; LVEF normal Nov 2013 3. ESRD on HD -  appreciate Nephrology assistance  Signed, EDMISTEN, Hunt Oris  EP Attending  Patient seen and examined. Agree with above. Note amended as needed. He has multiple problems. The main question is whether his cardiomyopathy might be reversible which might affect his long term need for ICD. Note plans for viability scan with Thallium as ordered by Dr. Caryl Comes as well as protocol regarding 2 days for evaluation.   Mikle Bosworth.D.

## 2013-05-16 NOTE — Progress Notes (Signed)
Called Nuclear Medicine this AM regarding thallium rest scan. Test ordered yesterday for myocardial viability, to be done today. However, outpatient pharmacy does not stock thallium and this will need to be ordered. Scan will be performed tomorrow, 05/17/2013. Results will be available on Saturday per Nuc Med. Will reorder diet for today. Keep NPO after midnight tonight.

## 2013-05-17 ENCOUNTER — Inpatient Hospital Stay (HOSPITAL_COMMUNITY): Payer: BC Managed Care – PPO

## 2013-05-17 DIAGNOSIS — I4949 Other premature depolarization: Secondary | ICD-10-CM

## 2013-05-17 LAB — CBC
HCT: 42.5 % (ref 39.0–52.0)
Hemoglobin: 14.5 g/dL (ref 13.0–17.0)
MCH: 28.8 pg (ref 26.0–34.0)
MCHC: 34.1 g/dL (ref 30.0–36.0)
MCV: 84.5 fL (ref 78.0–100.0)
Platelets: 165 10*3/uL (ref 150–400)
RBC: 5.03 MIL/uL (ref 4.22–5.81)
RDW: 13.7 % (ref 11.5–15.5)
WBC: 5.9 10*3/uL (ref 4.0–10.5)

## 2013-05-17 MED ORDER — THALLOUS CHLORIDE TL 201 1 MCI/ML IV SOLN
1.0000 | Freq: Once | INTRAVENOUS | Status: AC | PRN
  Administered 2013-05-17: 1 via INTRAVENOUS

## 2013-05-17 MED ORDER — AMIODARONE HCL 200 MG PO TABS
200.0000 mg | ORAL_TABLET | Freq: Two times a day (BID) | ORAL | Status: DC
  Administered 2013-05-17 – 2013-05-19 (×5): 200 mg via ORAL
  Filled 2013-05-17 (×8): qty 1

## 2013-05-17 MED ORDER — THALLOUS CHLORIDE TL 201 1 MCI/ML IV SOLN
3.0000 | Freq: Once | INTRAVENOUS | Status: AC | PRN
  Administered 2013-05-17: 3 via INTRAVENOUS

## 2013-05-17 NOTE — Progress Notes (Signed)
  [facility] Progress Note   Subjective: HD yest with 2.4kg off and no BP drop  Filed Vitals:   05/17/13 0000 05/17/13 0500 05/17/13 0747 05/17/13 1100  BP: 148/76 149/55 144/74 156/88  Pulse:   75 72  Temp:  98.8 F (37.1 C)  97.8 F (36.6 C)  TempSrc:  Oral Oral Oral  Resp: 14 16 28 20   Height:      Weight:  90 kg (198 lb 6.6 oz)    SpO2:    100%  Exam Alert, no distress, lying flat No jvd Chest clear bilat RRR no MRG Abd soft, nt/nd, no ascites, well healed scars No LE or UE edema Left upper arm AVF patent Neuro is alert, nf, ox3  Dialysis Orders: MWF @ [facility]  88.5 kg 4hr 68min 2K/2Ca Heparin 8400 LUA AVF 450/A1.5  Hectorol 23mcg Epogen none Venofer none  PTH 198, Hb 13.9, tsat 23% ferr 122, Ca/P ok w/o binders  Assess/Rec: 1. VTach: resolved  > amiodarone and Life Vest 2. CM EF 25%, cath w no CAD: for thallium study today and tomorrow to determine LV viability 3. ESRD: HD tomorrow off schedule then resume MWF 4. HTN/volume: on metoprolol 50/d as at home; BP high normal w high filling pressures (LVEDP ~40) on cath. UF 3-4 kg with HD tomorrow as tolerated 5. Anemia: Hb up, no esa 6. MBD/CKD: cont vit D, no binders needed  Kelly Splinter MD pager [phone]    cell [phone] 05/17/2013, 1:19 PM   Recent Labs Lab 05/13/13 1226 05/14/13 0145  NA 139 139  K 3.1* 3.7  CL 98 99  CO2  --  28  GLUCOSE 115* 94  BUN 13 17  CREATININE 2.90* 3.83*  CALCIUM  --  8.0*   No results found for this basename: AST, ALT, ALKPHOS, BILITOT, PROT, ALBUMIN,  in the last 168 hours  Recent Labs Lab 05/13/13 1123  05/15/13 0221 05/16/13 0235 05/17/13 0315  WBC 4.8  < > 10.7* 5.9 5.9  NEUTROABS 3.4  --   --   --   --   HGB 15.3  < > 15.8 14.0 14.5  HCT 43.4  < > 44.5 41.3 42.5  MCV 81.6  < > 82.1 85.2 84.5  PLT 182  < > 204 157 165  < > = values in this interval not displayed. [name] amiodarone  200 mg Oral BID  . aspirin EC  81 mg Oral Daily  . atorvastatin  20  mg Oral Daily  . doxercalciferol  2 mcg Intravenous Q M,W,F-HD  . metoprolol succinate  50 mg Oral Daily  . multivitamin  1 tablet Oral QHS     acetaminophen, feeding supplement (NEPRO CARB STEADY), heparin, lidocaine (PF), lidocaine-prilocaine, nitroGLYCERIN, ondansetron (ZOFRAN) IV, pentafluoroprop-tetrafluoroeth

## 2013-05-17 NOTE — Progress Notes (Addendum)
Patient: Michael [name] Date of Encounter: 05/17/2013, 7:53 AM Admit date: 05/13/2013     Subjective  Denies CP, SOB, or any new concerns   Objective  Physical Exam: Vitals: BP 149/55  Pulse 71  Temp(Src) 98.8 F (37.1 C) (Oral)  Resp 16  Ht 5\' 6"  (1.676 m)  Wt 198 lb 6.6 oz (90 kg)  BMI 32.04 kg/m2  SpO2 95% General: Well developed, well appearing 58 year old male in no acute distress. HEENT:  OP clear Neck: Supple. JVP 7cm Lungs: Clear bilaterally to auscultation without wheezes, rales, or rhonchi. Breathing is unlabored. Heart: RRR Abdomen: Soft, non-distended. Extremities: No clubbing or cyanosis. No edema.   Neuro: Alert and oriented X 3. Moves all extremities spontaneously. No focal deficits.  Intake/Output:  Intake/Output Summary (Last 24 hours) at 05/17/13 0753 Last data filed at 05/17/13 0700  Gross per 24 hour  Intake 1010.7 ml  Output   3250 ml  Net -2239.3 ml    Inpatient Medications:  . amiodarone  200 mg Oral BID  . aspirin EC  81 mg Oral Daily  . atorvastatin  20 mg Oral Daily  . doxercalciferol  2 mcg Intravenous Q M,W,F-HD  . metoprolol succinate  50 mg Oral Daily  . multivitamin  1 tablet Oral QHS   . sodium chloride 10 mL/hr at 05/14/13 1800    Labs: No results found for this basename: NA, K, CL, CO2, GLUCOSE, BUN, CREATININE, CALCIUM, MG, PHOS,  in the last 72 hours  Recent Labs  05/16/13 0235 05/17/13 0315  WBC 5.9 5.9  HGB 14.0 14.5  HCT 41.3 42.5  MCV 85.2 84.5  PLT 157 165   No results found for this basename: CKTOTAL, CKMB, TROPONINI,  in the last 72 hours No results found for this basename: TSH, T4TOTAL, FREET3, T3FREE, THYROIDAB,  in the last 72 hours  Recent Labs  05/14/13 1113  INR 1.22    Radiology/Studies: Dg Chest Portable 1 View 05/13/2013   CLINICAL DATA:  Tachycardia.  EXAM: PORTABLE CHEST - 1 VIEW  COMPARISON:  01/15/2013  FINDINGS: 1103 hrs. Low volume film. No edema or focal airspace consolidation. Mild  basilar atelectasis evident. The cardio pericardial silhouette is enlarged. Telemetry leads overlie the chest. Imaged bony structures of the thorax are intact.  IMPRESSION: Basilar atelectasis without overt edema or focal lung consolidation. .   Electronically Signed   By: Misty Stanley M.D.   On: 05/13/2013 11:32   Echocardiogram Study Conclusions - Left ventricle: The cavity size was moderately dilated.   Wall thickness was normal. Systolic function was severely   reduced. The estimated ejection fraction was in the range   of 25% to 30%. There is severe hypokinesis of the   entireinferior myocardium. Left ventricular diastolic   function parameters were normal. - Aortic valve: Mild regurgitation. - Mitral valve: Mild regurgitation. - Left atrium: The atrium was mildly dilated. - Right atrium: The atrium was mildly dilated.  Telemetry:    Assessment and Plan  1. Sustained VT with frequent ventricular ectopy - improved on amiodarone--> convert to PO amiodarone today - thallium rest scan today and tomorrow for myocardial viability (to likely be read tomorrow afternoon) - no driving x 6 months--> I have informed the patient of this today - will need a lifevest arranged for when he is discharged.  I will make sure this is setup today.  Call Janan Halter prior to discharge to have LifeVest placed  2. NICM, EF <25% -  newly diagnosed; LVEF normal Nov 2013 - Dr Caryl Comes feels may be due to frequent ventricular ectopy - Amiodarone and lifevest at discharge  3. ESRD on HD - appreciate Nephrology assistance  4. PVCs Much improved with amiodarone Convert to PO today  Transfer to telemetry Ok to discharge this weekend once we have the result of his thallium (viability) Needs to follow-up with Dr Caryl Comes in 4-6 weeks

## 2013-05-18 ENCOUNTER — Encounter (HOSPITAL_COMMUNITY): Payer: BC Managed Care – PPO | Attending: Cardiology

## 2013-05-18 ENCOUNTER — Encounter (HOSPITAL_COMMUNITY): Payer: BC Managed Care – PPO

## 2013-05-18 LAB — CBC
HCT: 43.6 % (ref 39.0–52.0)
Hemoglobin: 15 g/dL (ref 13.0–17.0)
MCH: 29.1 pg (ref 26.0–34.0)
MCHC: 34.4 g/dL (ref 30.0–36.0)
MCV: 84.7 fL (ref 78.0–100.0)
Platelets: 160 10*3/uL (ref 150–400)
RBC: 5.15 MIL/uL (ref 4.22–5.81)
RDW: 13.5 % (ref 11.5–15.5)
WBC: 5.5 10*3/uL (ref 4.0–10.5)

## 2013-05-18 LAB — RENAL FUNCTION PANEL
Albumin: 3.6 g/dL (ref 3.5–5.2)
BUN: 29 mg/dL — ABNORMAL HIGH (ref 6–23)
CO2: 23 mEq/L (ref 19–32)
Calcium: 9.5 mg/dL (ref 8.4–10.5)
Chloride: 102 mEq/L (ref 96–112)
Creatinine, Ser: 5.41 mg/dL — ABNORMAL HIGH (ref 0.50–1.35)
GFR calc Af Amer: 12 mL/min — ABNORMAL LOW (ref >90)
GFR calc non Af Amer: 11 mL/min — ABNORMAL LOW (ref >90)
Glucose, Bld: 92 mg/dL (ref 70–99)
Phosphorus: 3.5 mg/dL (ref 2.3–4.6)
Potassium: 3.8 mEq/L (ref 3.7–5.3)
Sodium: 142 mEq/L (ref 137–147)

## 2013-05-18 MED ORDER — HEPARIN SODIUM (PORCINE) 1000 UNIT/ML DIALYSIS: 8000.0000 [IU] | Freq: Once | INTRAMUSCULAR | Status: DC

## 2013-05-18 MED ORDER — HEPARIN SODIUM (PORCINE) 1000 UNIT/ML DIALYSIS: 1000.0000 [IU] | INTRAMUSCULAR | Status: DC | PRN

## 2013-05-18 MED ORDER — SODIUM CHLORIDE 0.9 % IV SOLN: 100.0000 mL | INTRAVENOUS | Status: DC | PRN

## 2013-05-18 MED ORDER — ALTEPLASE 2 MG IJ SOLR: 2.0000 mg | Freq: Once | INTRAMUSCULAR | Status: DC | PRN

## 2013-05-18 MED ORDER — LIDOCAINE HCL (PF) 1 % IJ SOLN: 5.0000 mL | INTRAMUSCULAR | Status: DC | PRN

## 2013-05-18 MED ORDER — PENTAFLUOROPROP-TETRAFLUOROETH EX AERO: 1.0000 "application " | INHALATION_SPRAY | CUTANEOUS | Status: DC | PRN

## 2013-05-18 MED ORDER — LIDOCAINE-PRILOCAINE 2.5-2.5 % EX CREA: 1.0000 "application " | TOPICAL_CREAM | CUTANEOUS | Status: DC | PRN

## 2013-05-18 MED ORDER — NEPRO/CARBSTEADY PO LIQD: 237.0000 mL | ORAL | Status: DC | PRN

## 2013-05-18 NOTE — Progress Notes (Addendum)
Subjective:  Shortness of breath or chest pain. Tolerated dialysis well. Without arrhythmia.  Objective:  Vital Signs in the last 24 hours: BP 170/98  Pulse 80  Temp(Src) 98.3 F (36.8 C) (Oral)  Resp 18  Ht 5\' 6"  (1.676 m)  Wt 87.4 kg (192 lb 10.9 oz)  BMI 31.11 kg/m2  SpO2 100%  Physical Exam: Pleasant black male in no acute distress Lungs:  Clear  Cardiac:  Regular rhythm, normal S1 and S2, no S3, continuous transmitted murmur from fistula heard over aortic area Extremities:  No edema present, dialysis fistula present left upper arm  Intake/Output from previous day: 01/16 0701 - 01/17 0700 In: 586.7 [P.O.:560; I.V.:26.7] Out: 700 [Urine:700] Weight Filed Weights   05/17/13 0500 05/18/13 0406 05/18/13 0711  Weight: 90 kg (198 lb 6.6 oz) 89.7 kg (197 lb 12 oz) 87.4 kg (192 lb 10.9 oz)    Lab Results: Basic Metabolic Panel:  Recent Labs  05/18/13 0715  NA 142  K 3.8  CL 102  CO2 23  GLUCOSE 92  BUN 29*  CREATININE 5.41*    CBC:  Recent Labs  05/17/13 0315 05/18/13 0340  WBC 5.9 5.5  HGB 14.5 15.0  HCT 42.5 43.6  MCV 84.5 84.7  PLT 165 160   Telemetry: Occasional PVCs. No significant ventricular tachycardia noted  Assessment/Plan:  1. Sustained ventricular tachycardia with frequent ventricular ectopy which is significantly improved. 2. Cardiomyopathy 3. End stage renal disease on hemodialysis  Recommendations:  He had his rest thallium study this morning and will be looked at later today. If he remains stable state likely to be discharged tomorrow. He already has a life vest ready to go home with him.     Kerry Hough  MD [facility]  05/18/2013, 1:15 PM

## 2013-05-18 NOTE — Procedures (Signed)
I was present at this dialysis session, have reviewed the session itself and made  appropriate changes  Kelly Splinter MD (pgr) [phone]    ([phone] 05/18/2013, 10:19 AM

## 2013-05-18 NOTE — Progress Notes (Signed)
Subjective:  Seen on dialysis, comfortable, no complaints  Objective: Vital signs in last 24 hours: Temp:  [97.8 F (36.6 C)-98.2 F (36.8 C)] 97.9 F (36.6 C) (01/17 0711) Pulse Rate:  [68-75] 68 (01/17 0730) Resp:  [14-28] 14 (01/17 0730) BP: (137-161)/(69-99) 148/89 mmHg (01/17 0730) SpO2:  [97 %-100 %] 97 % (01/17 0711) Weight:  [87.4 kg (192 lb 10.9 oz)-89.7 kg (197 lb 12 oz)] 87.4 kg (192 lb 10.9 oz) (01/17 0711) Weight change: -1.6 kg (-3 lb 8.4 oz)  Intake/Output from previous day: 01/16 0701 - 01/17 0700 In: 586.7 [P.O.:560; I.V.:26.7] Out: 700 [Urine:700]   Lab Results:  Recent Labs  05/17/13 0315 05/18/13 0340  WBC 5.9 5.5  HGB 14.5 15.0  HCT 42.5 43.6  PLT 165 160   BMET: No results found for this basename: NA, K, CL, CO2, GLUCOSE, BUN, CREATININE, CALCIUM, PHOSPHORUS, ALBUMIN,  in the last 72 hours No results found for this basename: PTH,  in the last 72 hours Iron Studies: No results found for this basename: IRON, TIBC, TRANSFERRIN, FERRITIN,  in the last 72 hours  EXAM: General appearance:  Alert, in no apparent distress Resp:  CTA without rales, rhonchi, or wheezes Cardio: RRR without murmur or rub GI: + BS, soft and nontender Extremities:  No edema Access:   AVF @ LUA with BFR 400  Dialysis Orders: MWF @ [facility]  88.5 kg 4hr 60min 2K/2Ca Heparin 8400 LUA AVF 450/A1.5  Hectorol 85mcg Epogen none Venofer none  PTH 198, Hb 13.9, tsat 23% ferr 122, Ca/P ok w/o binders  Assessment/Plan: 1. V-tach - improved on Amiodarone, thallium rest scan yesterday, lifevest on discharge. 2. NICM - newly diagnosed, EF 25%, cath with no CAD. 3. ESRD - HD on MWF @ [facility], off schedule today. 4. HTN/Volume - BP 148/89 on Metoprolol 50 mg qd, BP high normal w/ high filling pressures (LVEDP ~40) on cath; pre-HD wt 87.4 kg.  UF goal of 1.5 L. Goal today will be new lower EDW of 87kg, tolerating well. 5. Anemia - Hgb 15, no Epogen. 6. Sec HPT - Hectorol 2 mcg, no binders.    LOS:  5 days   LYLES,CHARLES 05/18/2013,7:43 AM  I have seen and examined patient, discussed with PA and agree with assessment and plan as outlined above with additions as indicated. Kelly Splinter MD pager [phone]    cell [phone] 05/18/2013, 10:18 AM

## 2013-05-19 LAB — CBC
HCT: 43 % (ref 39.0–52.0)
Hemoglobin: 14.8 g/dL (ref 13.0–17.0)
MCH: 28.7 pg (ref 26.0–34.0)
MCHC: 34.4 g/dL (ref 30.0–36.0)
MCV: 83.5 fL (ref 78.0–100.0)
Platelets: 177 10*3/uL (ref 150–400)
RBC: 5.15 MIL/uL (ref 4.22–5.81)
RDW: 13.5 % (ref 11.5–15.5)
WBC: 5.2 10*3/uL (ref 4.0–10.5)

## 2013-05-19 MED ORDER — NITROGLYCERIN 0.4 MG SL SUBL: 0.4000 mg | SUBLINGUAL_TABLET | SUBLINGUAL | Status: DC | PRN

## 2013-05-19 MED ORDER — AMIODARONE HCL 200 MG PO TABS: 200.0000 mg | ORAL_TABLET | Freq: Two times a day (BID) | ORAL | Status: DC

## 2013-05-19 MED ORDER — ASPIRIN 81 MG PO TBEC: 81.0000 mg | DELAYED_RELEASE_TABLET | Freq: Every day | ORAL | Status: DC

## 2013-05-19 NOTE — Progress Notes (Signed)
Pt given d/c instructions at this time; IV's and tele monitor d/c at this time; pt to d/c home with wife.

## 2013-05-19 NOTE — Progress Notes (Signed)
Subjective:  Shortness of breath or chest pain. Feels well. Occasional PVC's noted.  Objective:  Vital Signs in the last 24 hours: BP 144/70  Pulse 70  Temp(Src) 98.5 F (36.9 C) (Oral)  Resp 18  Ht 5\' 6"  (1.676 m)  Wt 87.4 kg (192 lb 10.9 oz)  BMI 31.11 kg/m2  SpO2 97%  Physical Exam: Pleasant black male in no acute distress Lungs:  Clear  Cardiac:  Regular rhythm, normal S1 and S2, no S3, continuous transmitted murmur from fistula heard over aortic area Extremities:  No edema present, dialysis fistula present left upper arm  Intake/Output from previous day: 01/17 0701 - 01/18 0700 In: 720 [P.O.:720] Out: 1400 [Urine:900] Weight Filed Weights   05/17/13 0500 05/18/13 0406 05/18/13 0711  Weight: 90 kg (198 lb 6.6 oz) 89.7 kg (197 lb 12 oz) 87.4 kg (192 lb 10.9 oz)    Lab Results: Basic Metabolic Panel:  Recent Labs  05/18/13 0715  NA 142  K 3.8  CL 102  CO2 23  GLUCOSE 92  BUN 29*  CREATININE 5.41*    CBC:  Recent Labs  05/18/13 0340 05/19/13 0329  WBC 5.5 5.2  HGB 15.0 14.8  HCT 43.6 43.0  MCV 84.7 83.5  PLT 160 177   Telemetry: Occasional PVCs. No significant ventricular tachycardia noted  Radiology  No scarring seen on  Assessment/Plan:  1. Sustained ventricular tachycardia with frequent ventricular ectopy which is significantly improved. 2. Cardiomyopathy 3. End stage renal disease on hemodialysis  Recommendations:  Discharge today with followup with Dr. Caryl Comes in 4 weeks. Needs to go home on amiodarone 200 BID and lifevest.    W. Doristine Church  MD [facility]  05/19/2013, 10:46 AM

## 2013-05-19 NOTE — Discharge Summary (Signed)
Physician Discharge Summary  Patient ID: Michael [name] [name]: [id number] DOB/AGE: 56/23/1957 56 y.o.  Admit date: 05/13/2013 Discharge date: 05/19/2013  Primary Discharge Diagnosis: 1. SVT  2. Cardiomyopathy  Secondary Discharge Diagnosis 1.ESRD  Significant Diagnostic Studies:  Cardiac Cath  Echocardiogram: Left ventricle: The cavity size was moderately dilated. Wall thickness was normal. Systolic function was severely reduced. The estimated ejection fraction was in the range of 25% to 30%. There is severe hypokinesis of the entireinferior myocardium. Left ventricular diastolic function parameters were normal. - Aortic valve: Mild regurgitation. - Mitral valve: Mild regurgitation. - Left atrium: The atrium was mildly dilated. - Right atrium: The atrium was mildly dilated.   NM Study FINDINGS: On immediate imaging, there may be mild thinning involving the lateral apex. Distribution is otherwise physiologic. On 24 hr delayed imaging, there is possible mild redistribution of radiotracer in the lateral apex. Overall, this redistribution is not considered significant.  IMPRESSION: Essentially normal Thallium viability study. Very mild apical thinning with apical redistribution as possible.   Hospital Course: Michael [name] is a 58 year old patient admitted with a mild undergoing dialysis. The patient also developed tachycardia palpitations and presyncope. Patient has a history of hypertension hyperlipidemia end-stage renal disease on dialysis on Monday Wednesday Fridays with associated hyperparathyroidism.    During addition the patient had echocardiogram which demonstrated an EF of 20-30%, severe hypokinesis of the inferior myocardium. Patient had mild elevation in troponin at 0.41 with potassium of 3.1. He was seen by Dr. Thompson Grayer for further evaluation and recommendations. He was noted to be in wide-complex tachycardia which were reviewed by Dr. Rayann Heman, demonstrating  DVT and 20 ms. He was started on amiodarone.    Cardiac catheterization demonstrated widely patent coronary arteries, with right coronary artery dominant. LVEF was found to be 30% on RAO projection. It revealed dilated cavity with global hypokinesis with an EF of less than 25%. He was diagnosed with a nonischemic cardiomyopathy. Life vest was placed during hospitalization.    Recommendations for thallium rest scan to look for evidence of disrupted or scarred myocardium was made. Also consideration for catheter ablation given hypernatremic cardiac to be was highly dominant morphology the origin appearing to be in the septal floor of the left ventricle, per Dr. Caryl Comes.    The patient was continued on renal dialysis was consultation with Dr. Jonnie Finner,  nephrology. He was initially transferred to telemetry unit where he continued to do well without recurrence of tachyarrhythmias. No recurrent shortness of breath or chest pain. He was seen and examined by Dr. Oran Rein on day of discharge and found to be stable to return home. He was placed on amiodarone 200 mg BID. He will have one month followup with Dr. Caryl Comes. He will continue on wearing  life vest. Instructions to wear life vest were provided during hospitalization.     Discharge Exam: Blood pressure 144/70, pulse 70, temperature 98.5 F (36.9 C), temperature source Oral, resp. rate 18, height 5\' 6"  (1.676 m), weight 192 lb 10.9 oz (87.4 kg), SpO2 97.00%.   Labs:   Lab Results  Component Value Date   WBC 5.2 05/19/2013   HGB 14.8 05/19/2013   HCT 43.0 05/19/2013   MCV 83.5 05/19/2013   PLT 177 05/19/2013     Recent Labs Lab 05/18/13 0715  NA 142  K 3.8  CL 102  CO2 23  BUN 29*  CREATININE 5.41*  CALCIUM 9.5  GLUCOSE 92   Lab Results  Component Value Date  CKTOTAL 369* 08/16/2008   CKMB 2.6 08/16/2008   TROPONINI 0.35* 05/14/2013    No results found for this basename: CHOL   No results found for this basename: HDL   No results found for  this basename: LDLCALC   No results found for this basename: TRIG   No results found for this basename: CHOLHDL   No results found for this basename: LDLDIRECT      Radiology: Nm Myocar Multi W/spect W/wall Motion / Ef  05/18/2013   CLINICAL DATA:  Myocardial viability study  EXAM: MYOCARDIAL IMAGING WITH SPECT (REST) - THALLIUM  TECHNIQUE: Standard myocardial SPECT imaging was performed after resting intravenous injection of 3.5 mCi 201 Thallium. Delayed imaging was performed at 24 hr after additional administration of 1.0 mCi 201 Thallium.  COMPARISON:  None.  FINDINGS: On immediate imaging, there may be mild thinning involving the lateral apex. Distribution is otherwise physiologic.  On 24 hr delayed imaging, there is possible mild redistribution of radiotracer in the lateral apex.  Overall, this redistribution is not considered significant.  IMPRESSION: Essentially normal Thallium viability study. Very mild apical thinning with apical redistribution as possible.   Electronically Signed   By: Julian Hy M.D.   On: 05/18/2013 17:58   Dg Chest Portable 1 View  05/13/2013   CLINICAL DATA:  Tachycardia.  EXAM: PORTABLE CHEST - 1 VIEW  COMPARISON:  01/15/2013  FINDINGS: 1103 hrs. Low volume film. No edema or focal airspace consolidation. Mild basilar atelectasis evident. The cardio pericardial silhouette is enlarged. Telemetry leads overlie the chest. Imaged bony structures of the thorax are intact.  IMPRESSION: Basilar atelectasis without overt edema or focal lung consolidation. .   Electronically Signed   By: Misty Stanley M.D.   On: 05/13/2013 11:32    [place] APPOINTMENTS     Discharge Orders   Future Appointments Provider Department Dept Phone   05/21/2013 9:40 AM [name], MD [facility], [place] [phone]   Future Orders Complete By Expires   Diet - low sodium heart healthy  As directed    Increase activity slowly  As directed        Medication  List         amiodarone 200 MG tablet  Commonly known as:  PACERONE  Take 1 tablet (200 mg total) by mouth 2 (two) times daily.     aspirin 81 MG EC tablet  Take 1 tablet (81 mg total) by mouth daily.     atorvastatin 20 MG tablet  Commonly known as:  LIPITOR  Take 20 mg by mouth daily.     b complex-vitamin c-folic acid 0.8 MG Tabs tablet  Take 1 tablet by mouth daily.     metoprolol succinate 50 MG 24 hr tablet  Commonly known as:  TOPROL-XL  Take 50 mg by mouth daily. Take with or immediately following a meal.     nitroGLYCERIN 0.4 MG SL tablet  Commonly known as:  NITROSTAT  Place 1 tablet (0.4 mg total) under the tongue every 5 (five) minutes x 3 doses as needed for chest pain.         Time spent with patient to include physician time: 40 mintues. Signed: Jory Sims 05/19/2013, 12:00 PM Co-Sign MD

## 2013-05-19 NOTE — Progress Notes (Signed)
Subjective:  No complaints, feeling well, hoping to leave today.  Objective: Vital signs in last 24 hours: Temp:  [97.7 F (36.5 C)-99.3 F (37.4 C)] 98.5 F (36.9 C) (01/18 0432) Pulse Rate:  [66-80] 66 (01/18 0432) Resp:  [15-18] 18 (01/18 0432) BP: (132-170)/(65-98) 148/88 mmHg (01/18 0432) SpO2:  [97 %-100 %] 97 % (01/18 0432) Weight change: -2.3 kg (-5 lb 1.1 oz)  Intake/Output from previous day: 01/17 0701 - 01/18 0700 In: 720 [P.O.:720] Out: 1400 [Urine:900] Intake/Output this shift: Total I/O In: 240 [P.O.:240] Out: 200 [Urine:200]  Lab Results:  Recent Labs  05/18/13 0340 05/19/13 0329  WBC 5.5 5.2  HGB 15.0 14.8  HCT 43.6 43.0  PLT 160 177   BMET:  Recent Labs  05/18/13 0715  NA 142  K 3.8  CL 102  CO2 23  GLUCOSE 92  BUN 29*  CREATININE 5.41*  CALCIUM 9.5  ALBUMIN 3.6   No results found for this basename: PTH,  in the last 72 hours Iron Studies: No results found for this basename: IRON, TIBC, TRANSFERRIN, FERRITIN,  in the last 72 hours  EXAM:  General appearance: Alert, in no apparent distress  Resp: CTA without rales, rhonchi, or wheezes  Cardio: RRR without murmur or rub  GI: + BS, soft and nontender  Extremities: No edema  Access: AVF @ LUA with + bruit  Dialysis Orders: MWF @ [facility]  88.5 kg 4hr 79min 2K/2Ca Heparin 8400 LUA AVF 450/A1.5  Hectorol 63mcg Epogen none Venofer none  PTH 198, Hb 13.9, tsat 23% ferr 122, Ca/P ok w/o binders  Assessment/Plan: 1. V-tach - improved on Amiodarone, thallium rest scan yesterday with results pending, lifevest on discharge.  2. NICM - newly diagnosed, EF 25%, cath with no CAD.  3. ESRD - HD on MWF @ [facility], off schedule yesterday.  Next HD tomorrow.  4. HTN/Volume - BP 148/88 on Metoprolol 50 mg qd, BP high normal w/ high filling pressures (LVEDP ~40) on cath; pre-HD wt 87.4 kg yesterday with net UF 500 ml.  Lower EDW to 87 kg And challenge further at outpatient center 5. Anemia - Hgb 14.8, no Epogen.   6. Sec HPT - Ca 9.5 (9.9 corrected), P 3.5; Hectorol 2 mcg, no binders 7. Dispo- for d/c home today     LOS: 6 days   LYLES,CHARLES 05/19/2013,8:33 AM  I have seen and examined patient, discussed with PA and agree with assessment and plan as outlined above with additions as indicated. Kelly Splinter MD pager [phone]    cell [phone] 05/19/2013, 11:37 AM

## 2013-05-21 ENCOUNTER — Ambulatory Visit (INDEPENDENT_AMBULATORY_CARE_PROVIDER_SITE_OTHER): Payer: BC Managed Care – PPO | Admitting: General Surgery

## 2013-06-11 ENCOUNTER — Ambulatory Visit (INDEPENDENT_AMBULATORY_CARE_PROVIDER_SITE_OTHER): Payer: BC Managed Care – PPO | Admitting: Cardiology

## 2013-06-11 ENCOUNTER — Encounter: Payer: Self-pay | Admitting: Cardiology

## 2013-06-11 ENCOUNTER — Other Ambulatory Visit: Payer: Self-pay | Admitting: *Deleted

## 2013-06-11 VITALS — BP 142/84 | HR 65 | Ht 66.0 in | Wt 197.0 lb

## 2013-06-11 DIAGNOSIS — I4729 Other ventricular tachycardia: Secondary | ICD-10-CM

## 2013-06-11 DIAGNOSIS — I493 Ventricular premature depolarization: Secondary | ICD-10-CM

## 2013-06-11 DIAGNOSIS — I428 Other cardiomyopathies: Secondary | ICD-10-CM

## 2013-06-11 DIAGNOSIS — I472 Ventricular tachycardia, unspecified: Secondary | ICD-10-CM

## 2013-06-11 DIAGNOSIS — I4949 Other premature depolarization: Secondary | ICD-10-CM

## 2013-06-11 MED ORDER — AMIODARONE HCL 200 MG PO TABS: 200.0000 mg | ORAL_TABLET | Freq: Two times a day (BID) | ORAL | Status: DC

## 2013-06-11 NOTE — Patient Instructions (Addendum)
Your physician recommends that you keep your scheduled  follow-up appointment 07/14/13 at 2:00 with Dr. Bosie Helper  Your physician recommends that you continue on your current medications as directed. Please refer to the Current Medication list given to you today.

## 2013-06-11 NOTE — Progress Notes (Signed)
Patient ID: Michael [name] [name]: [id number], DOB/AGE: [birth year]   Date of Visit: 06/11/2013  Primary Physician: Vena Austria, MD Primary Cardiologist: Jolyn Nap, MD Reason for Visit: Hospital follow-up  History of Present Illness  [name] is a 58 y.o. male with ESRD on HD who presented to [facility] last month with symptomatic, sustained VT. An echo was done revealing LV dysfunction, LVEF 20-25%. He then underwent cardiac cath which revealed normal coronary arteries. He had frequent ventricular ectopy and there was concern that his cardiomyopathy may be related to his PVCs. He was initiated on amiodarone with improvement in ventricular ectopy. He had no further VT. He was discharged with a LifeVest 2 weeks ago. He presents today for hospital followup.   Since discharge, he reports he is doing well and has no complaints. He has not had any recurrent symptoms of palpitations or chest pain. He denies shortness of breath. He denies palpitations, dizziness, near syncope or syncope. He denies LE swelling, orthopnea, PND or recent weight gain. He is compliant and tolerating medications without difficulty. He is wearing his LifeVest.  Past Medical History Past Medical History  Diagnosis Date  . Hypertension   . Hyperparathyroidism, secondary renal   . Hyperlipidemia   . Fatigue   . Kidney stones     pased one, One Lazer   . Nonischemic cardiomyopathy     Er 25% 2015, 55 % 2013  . Ventricular tachycardia//Freq PVCs   . ESRD (end stage renal disease) on dialysis     Started dialysis Feb 2014, gets HD MWF at [facility]. Saw Dr Moshe Cipro prior to starting HD.  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 All except for one of his father's siblings had dialysis.  Has one brother with renal transplant, esrd also due to cystic kidney disease.    . Polycystic kidney disease, autosomal dominant     Past Surgical History Past Surgical History  Procedure Laterality  Date  . Appendectomy    . Cardiac catheterization  04-05-2010    checking for blockage but none-[facility]  . Av fistula placement  12/05/2011    Procedure: ARTERIOVENOUS (AV) FISTULA CREATION;LLEFT ARM  Surgeon: Conrad Kanawha, MD;  Location: [place];  Service: Vascular;  Laterality: Left;  RADIO-CEPHALIC  fistula left arm  . Av fistula placement  01/11/2012    Procedure: ARTERIOVENOUS (AV) FISTULA CREATION;  Surgeon: Conrad Los Olivos, MD;  Location: [place];  Service: Vascular;  Laterality: Left;  Creation of left brachial cephalic arteriovenous fistula  . Umbilical hernia repair  03/20/2012    Procedure: HERNIA REPAIR UMBILICAL ADULT;  Surgeon: [name], MD;  Location: [place];  Service: General;  Laterality: N/A;  . Insertion of mesh  03/20/2012    Procedure: INSERTION OF MESH;  UMB Surgeon: [name], MD;  Location: [place];  Service: General;  Laterality: N/A;  . Hernia repair    . Laparotomy  04/02/2012    Procedure: EXPLORATORY LAPAROTOMY;  Surgeon: [name], MD;  Location: [facility] OR;  Service: General;  Laterality: N/A;  Exploratory Laparotomy with resection of small intestine  . Tonsillectomy    . Umbilical hernia repair  01/22/2013    preperitoneal open procedure due to significant adhesions/notes 01/22/2013  . Cystoscopy w/ stone manipulation      "laser once" (01/22/2013)  . Ventral hernia repair N/A 01/22/2013    Procedure: ATTEMPTED LAPAROSCOPIC VENTRAL HERNIA CONVERTED TO OPEN;  Surgeon: [name], MD;  Location: [facility]  OR;  Service: General;  Laterality: N/A;  . Insertion of mesh N/A 01/22/2013    Procedure: INSERTION OF MESH;  Surgeon: [name], MD;  Location: [place];  Service: General;  Laterality: N/A;    Allergies/Intolerances Allergies  Allergen Reactions  . Lipitor [Atorvastatin] Itching    Leg pain    Current Home Medications Current Outpatient Prescriptions  Medication Sig Dispense Refill  . amiodarone (PACERONE) 200 MG tablet Take 1 tablet (200 mg  total) by mouth 2 (two) times daily.  60 tablet  6  . aspirin EC 81 MG EC tablet Take 1 tablet (81 mg total) by mouth daily.      [name] atorvastatin (LIPITOR) 40 MG tablet Take 40 mg by mouth daily.      [name] b complex-vitamin c-folic acid (NEPHRO-VITE) 0.8 MG TABS tablet Take 1 tablet by mouth daily.      . metoprolol succinate (TOPROL-XL) 100 MG 24 hr tablet Take 100 mg by mouth daily. Take with or immediately following a meal.      . nitroGLYCERIN (NITROSTAT) 0.4 MG SL tablet Place 1 tablet (0.4 mg total) under the tongue every 5 (five) minutes x 3 doses as needed for chest pain.  30 tablet  12   No current facility-administered medications for this visit.    Social History History   Social History  . Marital Status: Married    Spouse Name: N/A    Number of Children: N/A  . Years of Education: N/A   Occupational History  . Not on file.   Social History Main Topics  . Smoking status: Never Smoker   . Smokeless tobacco: Never Used  . Alcohol Use: No  . Drug Use: No  . Sexual Activity: Yes   Other Topics Concern  . Not on file   Social History Narrative  . No narrative on file     Review of Systems General: No chills, fever, night sweats or weight changes Cardiovascular: No chest pain, dyspnea on exertion, edema, orthopnea, palpitations, paroxysmal nocturnal dyspnea Dermatological: No rash, lesions or masses Respiratory: No cough, dyspnea Urologic: No hematuria, dysuria Abdominal: No nausea, vomiting, diarrhea, bright red blood per rectum, melena, or hematemesis Neurologic: No visual changes, weakness, changes in mental status All other systems reviewed and are otherwise negative except as noted above.  Physical Exam Vitals: Blood pressure 142/84, pulse 65, height 5\' 6"  (1.676 m), weight 197 lb (89.359 kg).  General: Well developed, well appearing 57 y.o. male in no acute distress. HEENT: Normocephalic, atraumatic. EOMs intact. Sclera nonicteric. Oropharynx clear.  Neck:  Supple. No JVD. Lungs: Respirations regular and unlabored, CTA bilaterally. No wheezes, rales or rhonchi. Heart: RRR. S1, S2 present. No murmurs, rub, S3 or S4. Abdomen: Soft, non-distended.  Extremities: No clubbing, cyanosis or edema. PT/Radials 2+ and equal bilaterally. Psych: Normal affect. Neuro: Alert and oriented X 3. Moves all extremities spontaneously.   Diagnostics  Echocardiogram  Study Conclusions - Left ventricle: The cavity size was moderately dilated. Wall thickness was normal. Systolic function was severely reduced. The estimated ejection fraction was in the range of 25% to 30%. There is severe hypokinesis of the entireinferior myocardium. Left ventricular diastolic function parameters were normal. - Aortic valve: Mild regurgitation. - Mitral valve: Mild regurgitation. - Left atrium: The atrium was mildly dilated. - Right atrium: The atrium was mildly dilated.  Cardiac cath HEMODYNAMICS: Aortic pressure 171/99 mmHg 170/24 mmHg; LV pressure 42 mm mercury  ANGIOGRAPHIC DATA: The left main coronary artery is widely patent.  The left anterior descending artery is widely patent with minimal luminal irregularities.  The left circumflex artery is widely patent with no significant obstruction noted.  The right coronary artery is widely patent and dominant.  LEFT VENTRICULOGRAM: Left ventricular angiogram was done in the 30 RAO projection and revealed dilated cavity with global hypokinesis and EF less than 25%  IMPRESSIONS: 1. Nonischemic cardiomyopathy with reduced EF less than 25% and severely elevated left ventricular end-diastolic pressures  2. Widely patent coronary arteries  Thallium viability scan 05/18/2013 FINDINGS: On immediate imaging, there may be mild thinning involving the lateral apex. Distribution is otherwise physiologic. On 24 hr delayed imaging, there is possible mild redistribution of radiotracer in the lateral apex. Overall, this redistribution is  not considered significant. IMPRESSION: Essentially normal Thallium viability study. Very mild apical thinning with apical redistribution as possible.  12-lead ECG today - NSR at 64 bpm with nonspecific T wave abnormalities  Assessment and Plan  1. Sustained VT and frequent PVCs - continue wearing LifeVest - continue amiodarone - no driving for 6 months until given clearance by Dr. Caryl Comes - follow-up with Dr. Caryl Comes in 4 weeks  2. NICM, EF <25% - now on guideline directed medical therapy (no ACEI/ARB due to ESRD) - question if related to increased PVC burden which is improved on amiodarone and BB - continue amiodarone and BB; repeat echo in follow-up to reassess LVEF  Signed, Lynesha Bango, PA-C 06/11/2013, 10:32 AM

## 2013-07-09 ENCOUNTER — Other Ambulatory Visit: Payer: Self-pay

## 2013-07-09 MED ORDER — AMIODARONE HCL 200 MG PO TABS: 200.0000 mg | ORAL_TABLET | Freq: Two times a day (BID) | ORAL | Status: DC

## 2013-07-16 ENCOUNTER — Encounter: Payer: Self-pay | Admitting: Internal Medicine

## 2013-07-16 ENCOUNTER — Ambulatory Visit (INDEPENDENT_AMBULATORY_CARE_PROVIDER_SITE_OTHER): Payer: BC Managed Care – PPO | Admitting: Internal Medicine

## 2013-07-16 VITALS — BP 162/86 | HR 66 | Ht 66.0 in | Wt 205.0 lb

## 2013-07-16 DIAGNOSIS — I428 Other cardiomyopathies: Secondary | ICD-10-CM

## 2013-07-16 DIAGNOSIS — I493 Ventricular premature depolarization: Secondary | ICD-10-CM

## 2013-07-16 DIAGNOSIS — I4949 Other premature depolarization: Secondary | ICD-10-CM

## 2013-07-16 DIAGNOSIS — I472 Ventricular tachycardia, unspecified: Secondary | ICD-10-CM

## 2013-07-16 DIAGNOSIS — I4729 Other ventricular tachycardia: Secondary | ICD-10-CM

## 2013-07-16 LAB — HEPATIC FUNCTION PANEL
ALT: 19 U/L (ref 0–53)
AST: 21 U/L (ref 0–37)
Albumin: 4 g/dL (ref 3.5–5.2)
Alkaline Phosphatase: 55 U/L (ref 39–117)
Bilirubin, Direct: 0 mg/dL (ref 0.0–0.3)
Total Bilirubin: 1.1 mg/dL (ref 0.3–1.2)
Total Protein: 7.6 g/dL (ref 6.0–8.3)

## 2013-07-16 LAB — TSH: TSH: 0.34 u[IU]/mL — ABNORMAL LOW (ref 0.35–5.50)

## 2013-07-16 NOTE — Progress Notes (Signed)
Patient Care Team: Michael Dus, MD as PCP - General (Family Medicine) Michael Meckel, MD as Attending Physician (Nephrology)   HPI  Michael [name] is a 58 y.o. male Seen in followup for VT in the setting of rapidly developing NICM and freq PVCs and ESRD  With decision to treat with amio and lifevest  He is feeling much better. He has no significant shortness of breath has had no palpitations.     Past Medical History  Diagnosis Date  . Hypertension   . Hyperparathyroidism, secondary renal   . Hyperlipidemia   . Fatigue   . Kidney stones     pased one, One Lazer   . Nonischemic cardiomyopathy     Er 25% 2015, 55 % 2013  . Ventricular tachycardia//Freq PVCs   . ESRD (end stage renal disease) on dialysis     Started dialysis Feb 2014, gets HD MWF at [facility]. Saw Michael [name] prior to starting HD.  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 All except for one of his father's siblings had dialysis.  Has one brother with renal transplant, esrd also due to cystic kidney disease.    . Polycystic kidney disease, autosomal dominant     Past Surgical History  Procedure Laterality Date  . Appendectomy    . Cardiac catheterization  04-05-2010    checking for blockage but none-[facility]  . Av fistula placement  12/05/2011    Procedure: ARTERIOVENOUS (AV) FISTULA CREATION;LLEFT ARM  Surgeon: Michael Sarita, MD;  Location: [place];  Service: Vascular;  Laterality: Left;  RADIO-CEPHALIC  fistula left arm  . Av fistula placement  01/11/2012    Procedure: ARTERIOVENOUS (AV) FISTULA CREATION;  Surgeon: Michael , MD;  Location: [place];  Service: Vascular;  Laterality: Left;  Creation of left brachial cephalic arteriovenous fistula  . Umbilical hernia repair  03/20/2012    Procedure: HERNIA REPAIR UMBILICAL ADULT;  Surgeon: Michael Bookbinder, MD;  Location: [place];  Service: General;  Laterality: N/A;  . Insertion of mesh  03/20/2012    Procedure: INSERTION OF  MESH;  UMB Surgeon: Michael Bookbinder, MD;  Location: [place];  Service: General;  Laterality: N/A;  . Hernia repair    . Laparotomy  04/02/2012    Procedure: EXPLORATORY LAPAROTOMY;  Surgeon: Michael Bookbinder, MD;  Location: [facility] OR;  Service: General;  Laterality: N/A;  Exploratory Laparotomy with resection of small intestine  . Tonsillectomy    . Umbilical hernia repair  01/22/2013    preperitoneal open procedure due to significant adhesions/notes 01/22/2013  . Cystoscopy w/ stone manipulation      "laser once" (01/22/2013)  . Ventral hernia repair N/A 01/22/2013    Procedure: ATTEMPTED LAPAROSCOPIC VENTRAL HERNIA CONVERTED TO OPEN;  Surgeon: Michael Bookbinder, MD;  Location: [place];  Service: General;  Laterality: N/A;  . Insertion of mesh N/A 01/22/2013    Procedure: INSERTION OF MESH;  Surgeon: Michael Bookbinder, MD;  Location: [place];  Service: General;  Laterality: N/A;    Current Outpatient Prescriptions  Medication Sig Dispense Refill  . amiodarone (PACERONE) 200 MG tablet Take 1 tablet (200 mg total) by mouth 2 (two) times daily.  180 tablet  3  . aspirin EC 81 MG EC tablet Take 1 tablet (81 mg total) by mouth daily.      [name] atorvastatin (LIPITOR) 40 MG tablet Take 40 mg by mouth daily.      [name] b  complex-vitamin c-folic acid (NEPHRO-VITE) 0.8 MG TABS tablet Take 1 tablet by mouth daily.      . metoprolol succinate (TOPROL-XL) 100 MG 24 hr tablet Take 100 mg by mouth daily. Take with or immediately following a meal.      . nitroGLYCERIN (NITROSTAT) 0.4 MG SL tablet Place 1 tablet (0.4 mg total) under the tongue every 5 (five) minutes x 3 doses as needed for chest pain.  30 tablet  12   No current facility-administered medications for this visit.    Allergies  Allergen Reactions  . Lipitor [Atorvastatin] Itching    Leg pain    Review of Systems negative except from HPI and PMH  Physical Exam BP 162/86  Pulse 66  Ht 5\' 6"  (1.676 m)  Wt 205 lb (92.987 kg)  BMI 33.10 kg/m2 Well  developed and well nourished in no acute distress HENT normal E scleral and icterus clear Neck Supple JVP flat; carotids brisk and full Clear to ausculation  Regular rate and rhythm, continuous murmur related to his dialysis fistula Soft with active bowel sounds No clubbing cyanosis  Edema Alert and oriented, grossly normal motor and sensory function Skin Warm and Dry  ECG demonstrates no ventricular ectopy  Assessment and  Plan  Nonischemic cardio myopathy-rapidly developing  Ventricular ectopy/tachycardia  End-stage renal disease on dialysis  Hypertension  Amiodarone therapy for #2  Michael [name] is feeling much better. He is euvolemic. We'll undertake an echocardiogram to assess LV function; hopefully there has been significant interval recovery period his blood pressure is elevated; in the event that his LVEF has not improved, I would begin him on hydralazine/nitrates.  For now we will continue him on amiodarone. We'll undertake a 24-hour Holter monitor to assess ventricular ectopy burden and obtain surveillance laboratories.

## 2013-07-16 NOTE — Patient Instructions (Signed)
Your physician has requested that you have an echocardiogram. Echocardiography is a painless test that uses sound waves to create images of your heart. It provides your doctor with information about the size and shape of your heart and how well your heart's chambers and valves are working. This procedure takes approximately one hour. There are no restrictions for this procedure.  Your physician has recommended that you wear a holter monitor. Holter monitors are medical devices that record the heart's electrical activity. Doctors most often use these monitors to diagnose arrhythmias. Arrhythmias are problems with the speed or rhythm of the heartbeat. The monitor is a small, portable device. You can wear one while you do your normal daily activities. This is usually used to diagnose what is causing palpitations/syncope (passing out).  Your physician recommends that you have lab work today: TSH/LFT  Your physician recommends that you continue on your current medications as directed. Please refer to the Current Medication list given to you today.  Your physician recommends that you schedule a follow-up appointment in: 3 months with Dr. Caryl Comes.

## 2013-07-18 ENCOUNTER — Encounter: Payer: Self-pay | Admitting: *Deleted

## 2013-07-18 ENCOUNTER — Encounter (INDEPENDENT_AMBULATORY_CARE_PROVIDER_SITE_OTHER): Payer: BC Managed Care – PPO

## 2013-07-18 DIAGNOSIS — I472 Ventricular tachycardia, unspecified: Secondary | ICD-10-CM

## 2013-07-18 DIAGNOSIS — I4729 Other ventricular tachycardia: Secondary | ICD-10-CM

## 2013-07-18 DIAGNOSIS — I4949 Other premature depolarization: Secondary | ICD-10-CM

## 2013-07-18 DIAGNOSIS — I493 Ventricular premature depolarization: Secondary | ICD-10-CM

## 2013-07-18 NOTE — Progress Notes (Signed)
Patient ID: Michael [name], male   DOB: Feb 19, 1956, 58 y.o.   [name]: [id number] E-Cardio 24 hour holter monitor applied to patient.

## 2013-07-23 ENCOUNTER — Ambulatory Visit (HOSPITAL_COMMUNITY)
Admission: RE | Admit: 2013-07-23 | Discharge: 2013-07-23 | Disposition: A | Discharge status: Home / Self Care | Payer: BC Managed Care – PPO | Source: Ambulatory Visit | Attending: Internal Medicine | Admitting: Internal Medicine

## 2013-07-23 DIAGNOSIS — I359 Nonrheumatic aortic valve disorder, unspecified: Secondary | ICD-10-CM

## 2013-07-23 DIAGNOSIS — I4729 Other ventricular tachycardia: Secondary | ICD-10-CM | POA: Insufficient documentation

## 2013-07-23 DIAGNOSIS — I428 Other cardiomyopathies: Secondary | ICD-10-CM | POA: Insufficient documentation

## 2013-07-23 DIAGNOSIS — I472 Ventricular tachycardia, unspecified: Secondary | ICD-10-CM | POA: Insufficient documentation

## 2013-07-23 NOTE — Progress Notes (Signed)
2D Echocardiogram has been performed. 

## 2013-07-24 ENCOUNTER — Telehealth: Payer: Self-pay | Admitting: Internal Medicine

## 2013-07-24 ENCOUNTER — Other Ambulatory Visit: Payer: Self-pay

## 2013-07-24 DIAGNOSIS — I493 Ventricular premature depolarization: Secondary | ICD-10-CM

## 2013-07-24 DIAGNOSIS — I428 Other cardiomyopathies: Secondary | ICD-10-CM

## 2013-07-24 DIAGNOSIS — I472 Ventricular tachycardia, unspecified: Secondary | ICD-10-CM

## 2013-07-24 MED ORDER — HYDRALAZINE HCL 25 MG PO TABS: ORAL_TABLET | ORAL | Status: DC

## 2013-07-24 MED ORDER — ISOSORBIDE MONONITRATE ER 30 MG PO TB24: 30.0000 mg | ORAL_TABLET | Freq: Every day | ORAL | Status: DC

## 2013-07-24 NOTE — Telephone Encounter (Signed)
New message     Take to Dr Caryl Comes or his nurse about the procedures he needs to have

## 2013-07-24 NOTE — Telephone Encounter (Signed)
Returned call to patient he stated he did not want to take hydralazine and imdur.Does not want to wear 4 week monitor.Stated Dr.Klein told him his heart was fine and he would be able to take off life vest.Will have Dr.Klein's nurse Judeen Hammans to call you back tomorrow 07/25/13.

## 2013-07-29 ENCOUNTER — Telehealth: Payer: Self-pay | Admitting: Internal Medicine

## 2013-07-29 ENCOUNTER — Other Ambulatory Visit: Payer: Self-pay | Admitting: *Deleted

## 2013-07-29 DIAGNOSIS — R7989 Other specified abnormal findings of blood chemistry: Secondary | ICD-10-CM

## 2013-07-29 NOTE — Telephone Encounter (Signed)
New message  Patient needs a letter from Dr Caryl Comes so her can get back on the list for Kidney Transplant. Please call if any questions. Please advise.

## 2013-07-29 NOTE — Telephone Encounter (Signed)
Let patient know that Dr Caryl Comes was informed of pt's wishes - not wanting to take meds, not wanting to wear monitor, not wanting to continue life vest -- Dr Caryl Comes states "ok, if that is his wishes". Pt is going to return life vest.

## 2013-07-30 NOTE — Telephone Encounter (Signed)
Dr. Caryl Comes called and spoke with patient about this matter.  He explained that he would call Dr. Krystal Eaton tomorrow, and then call patient after speaking with her. Pt agreeable w/ plan.

## 2013-08-01 ENCOUNTER — Other Ambulatory Visit: Payer: BC Managed Care – PPO

## 2013-08-05 ENCOUNTER — Telehealth: Payer: Self-pay | Admitting: Internal Medicine

## 2013-08-05 ENCOUNTER — Encounter: Payer: Self-pay | Admitting: Internal Medicine

## 2013-08-05 NOTE — Telephone Encounter (Signed)
Pt reviewed letter that Dr. Caryl Comes wrote for transplant and had questions to why it said what it does. I explained that Dr. Caryl Comes spoke with Dr. Lorrene Reid asking what they needed from him in a letter and afterwards typed up their requested information.  Pt then proceeded to complain about ejection fraction and functional class (info contained in letter). I explained that these are numbers that can't be changed - they are based upon testing/findings. Pt still upset because he states at the current statement, he will not qualify for transplant list. He states he is going to get a second opinion. I will forward to Dr. Caryl Comes for his fyi.

## 2013-08-05 NOTE — Telephone Encounter (Signed)
Informed pt that Dr. Caryl Comes spoke with Dr. Lorrene Reid. Dr. Caryl Comes typed letter for patient to pick up. Left at front desk, pt agreeable to stop by and pick up this week.

## 2013-08-05 NOTE — Telephone Encounter (Signed)
New problem    Pt need to speak to you. Would say why

## 2013-08-05 NOTE — Telephone Encounter (Signed)
°  Patient is returning your call. Please call and advise.  °

## 2013-08-13 ENCOUNTER — Other Ambulatory Visit: Payer: Self-pay | Admitting: Family Medicine

## 2013-10-24 ENCOUNTER — Ambulatory Visit (INDEPENDENT_AMBULATORY_CARE_PROVIDER_SITE_OTHER): Payer: BC Managed Care – PPO | Admitting: Internal Medicine

## 2013-10-24 VITALS — BP 159/88 | HR 68 | Ht 66.0 in | Wt 199.0 lb

## 2013-10-24 DIAGNOSIS — I472 Ventricular tachycardia, unspecified: Secondary | ICD-10-CM

## 2013-10-24 DIAGNOSIS — I493 Ventricular premature depolarization: Secondary | ICD-10-CM

## 2013-10-24 DIAGNOSIS — I428 Other cardiomyopathies: Secondary | ICD-10-CM

## 2013-10-24 DIAGNOSIS — I4729 Other ventricular tachycardia: Secondary | ICD-10-CM

## 2013-10-24 DIAGNOSIS — I4949 Other premature depolarization: Secondary | ICD-10-CM

## 2013-10-24 MED ORDER — ISOSORBIDE MONONITRATE ER 30 MG PO TB24: 30.0000 mg | ORAL_TABLET | Freq: Every day | ORAL | Status: DC

## 2013-10-24 MED ORDER — HYDRALAZINE HCL 25 MG PO TABS: ORAL_TABLET | ORAL | Status: DC

## 2013-10-24 MED ORDER — AMIODARONE HCL 200 MG PO TABS: 200.0000 mg | ORAL_TABLET | Freq: Every day | ORAL | Status: DC

## 2013-10-24 NOTE — Progress Notes (Signed)
Patient Care Team: [name], MD as PCP - General (Family Medicine) Louis Meckel, MD as Attending Physician (Nephrology)   HPI  Michael [name] is a 58 y.o. male Seen in followup for VT in the setting of rapidly developing NICM and freq PVCs and ESRD With decision to treat with amio and lifevest   At his last visit in March, he was feeling much better. Repeat echo however demonstrated ejection fraction 25-30% range. Alternative therapies with hydralazine/nitrates were recommended but declined.  A letter was written regarding transplantation describing his functional status and his ejection fraction     Past Medical History  Diagnosis Date  . Hypertension   . Hyperparathyroidism, secondary renal   . Hyperlipidemia   . Fatigue   . Kidney stones     pased one, One Lazer   . Nonischemic cardiomyopathy     Er 25% 2015, 55 % 2013  . Ventricular tachycardia//Freq PVCs   . ESRD (end stage renal disease) on dialysis     Started dialysis Feb 2014, gets HD MWF at [facility]. Saw Dr Moshe Cipro prior to starting HD.  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 All except for one of his father's siblings had dialysis.  Has one brother with renal transplant, esrd also due to cystic kidney disease.    . Polycystic kidney disease, autosomal dominant     Past Surgical History  Procedure Laterality Date  . Appendectomy    . Cardiac catheterization  04-05-2010    checking for blockage but none-[facility]  . Av fistula placement  12/05/2011    Procedure: ARTERIOVENOUS (AV) FISTULA CREATION;LLEFT ARM  Surgeon: Conrad McElhattan, MD;  Location: [place];  Service: Vascular;  Laterality: Left;  RADIO-CEPHALIC  fistula left arm  . Av fistula placement  01/11/2012    Procedure: ARTERIOVENOUS (AV) FISTULA CREATION;  Surgeon: Conrad Miamitown, MD;  Location: [place];  Service: Vascular;  Laterality: Left;  Creation of left brachial cephalic arteriovenous fistula  . Umbilical hernia  repair  03/20/2012    Procedure: HERNIA REPAIR UMBILICAL ADULT;  Surgeon: [name], MD;  Location: [place];  Service: General;  Laterality: N/A;  . Insertion of mesh  03/20/2012    Procedure: INSERTION OF MESH;  UMB Surgeon: [name], MD;  Location: [place];  Service: General;  Laterality: N/A;  . Hernia repair    . Laparotomy  04/02/2012    Procedure: EXPLORATORY LAPAROTOMY;  Surgeon: [name], MD;  Location: [facility] OR;  Service: General;  Laterality: N/A;  Exploratory Laparotomy with resection of small intestine  . Tonsillectomy    . Umbilical hernia repair  01/22/2013    preperitoneal open procedure due to significant adhesions/notes 01/22/2013  . Cystoscopy w/ stone manipulation      "laser once" (01/22/2013)  . Ventral hernia repair N/A 01/22/2013    Procedure: ATTEMPTED LAPAROSCOPIC VENTRAL HERNIA CONVERTED TO OPEN;  Surgeon: [name], MD;  Location: [place];  Service: General;  Laterality: N/A;  . Insertion of mesh N/A 01/22/2013    Procedure: INSERTION OF MESH;  Surgeon: [name], MD;  Location: [place];  Service: General;  Laterality: N/A;    Current Outpatient Prescriptions  Medication Sig Dispense Refill  . amiodarone (PACERONE) 200 MG tablet Take 1 tablet (200 mg total) by mouth 2 (two) times daily.  180 tablet  3  . aspirin EC 81 MG EC tablet Take 1 tablet (81 mg total) by  mouth daily.      [name] atorvastatin (LIPITOR) 40 MG tablet Take 40 mg by mouth daily.      [name] b complex-vitamin c-folic acid (NEPHRO-VITE) 0.8 MG TABS tablet Take 1 tablet by mouth daily.      . metoprolol succinate (TOPROL-XL) 100 MG 24 hr tablet Take 100 mg by mouth daily. Take with or immediately following a meal.      . nitroGLYCERIN (NITROSTAT) 0.4 MG SL tablet Place 1 tablet (0.4 mg total) under the tongue every 5 (five) minutes x 3 doses as needed for chest pain.  30 tablet  12   No current facility-administered medications for this visit.    Allergies  Allergen Reactions    . Lipitor [Atorvastatin] Itching    Leg pain    Review of Systems negative except from HPI and PMH  Physical Exam BP 159/88  Pulse 68  Ht 5\' 6"  (1.676 m)  Wt 199 lb (90.266 kg)  BMI 32.13 kg/m2 Well developed and well nourished in no acute distress HENT normal E scleral and icterus clear Neck Supple JVP flat; carotids brisk and full Clear to ausculation regular rate and rhythm, continuous home versus murmur Soft with active bowel sounds No clubbing cyanosis  Edema Alert and oriented, grossly normal motor and sensory function Skin Warm and Dry  ECG demonstrates sinus rhythm at 68 with intervals 20/13/46 Frequent PVCs with a left bundle superior axis morphology  Assessment and  Plan  Nonischemic cardiac myopathy  Ventricular tachycardia/ventricular ectopy  Hypertension  End stage renal disease on dialysis  We will decrease his amiodarone from 400--200 mg a day  He is agreeable to begin hydralazine/nitrates and this will have an impact on his blood pressure and potentially also on left ventricular function  We'll plan to review situation again in a month. At that time will discuss ICD therapy again

## 2013-10-24 NOTE — Patient Instructions (Signed)
Your physician recommends that you schedule a follow-up appointment in: 4-5 weeks with Dr Caryl Comes  Your physician has recommended you make the following change in your medication:  1) Decrease Amiodarone to 200mg  daily

## 2013-12-04 ENCOUNTER — Ambulatory Visit: Payer: BC Managed Care – PPO | Admitting: Internal Medicine

## 2013-12-09 ENCOUNTER — Encounter: Payer: Self-pay | Admitting: *Deleted

## 2013-12-12 ENCOUNTER — Encounter: Payer: Self-pay | Admitting: Neurology

## 2013-12-12 ENCOUNTER — Ambulatory Visit (INDEPENDENT_AMBULATORY_CARE_PROVIDER_SITE_OTHER): Payer: BC Managed Care – PPO | Admitting: Neurology

## 2013-12-12 VITALS — BP 154/81 | HR 59 | Resp 16 | Ht 68.75 in | Wt 198.0 lb

## 2013-12-12 DIAGNOSIS — R0609 Other forms of dyspnea: Secondary | ICD-10-CM

## 2013-12-12 DIAGNOSIS — R0683 Snoring: Secondary | ICD-10-CM

## 2013-12-12 DIAGNOSIS — R0989 Other specified symptoms and signs involving the circulatory and respiratory systems: Secondary | ICD-10-CM

## 2013-12-12 DIAGNOSIS — G4710 Hypersomnia, unspecified: Secondary | ICD-10-CM | POA: Insufficient documentation

## 2013-12-12 DIAGNOSIS — G4730 Sleep apnea, unspecified: Secondary | ICD-10-CM

## 2013-12-12 DIAGNOSIS — N186 End stage renal disease: Secondary | ICD-10-CM

## 2013-12-12 DIAGNOSIS — Z992 Dependence on renal dialysis: Secondary | ICD-10-CM

## 2013-12-12 DIAGNOSIS — R0902 Hypoxemia: Secondary | ICD-10-CM

## 2013-12-12 DIAGNOSIS — J45909 Unspecified asthma, uncomplicated: Secondary | ICD-10-CM

## 2013-12-12 HISTORY — DX: Hypoxemia: R09.02

## 2013-12-12 MED ORDER — GABAPENTIN 100 MG PO CAPS: ORAL_CAPSULE | ORAL | Status: DC
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
## 2013-12-12 NOTE — Patient Instructions (Signed)
Gabapentin capsules or tablets What is this medicine? GABAPENTIN (GA ba pen tin) is used to control partial seizures in adults with epilepsy. It is also used to treat certain types of nerve pain. This medicine may be used for other purposes; ask your health care provider or pharmacist if you have questions. COMMON BRAND NAME(S): Gabarone, Neurontin What should I tell my health care provider before I take this medicine? They need to know if you have any of these conditions: -kidney disease -suicidal thoughts, plans, or attempt; a previous suicide attempt by you or a family member -an unusual or allergic reaction to gabapentin, other medicines, foods, dyes, or preservatives -pregnant or trying to get pregnant -breast-feeding How should I use this medicine? Take this medicine by mouth with a glass of water. Follow the directions on the prescription label. You can take it with or without food. If it upsets your stomach, take it with food.Take your medicine at regular intervals. Do not take it more often than directed. Do not stop taking except on your doctor's advice. If you are directed to break the 600 or 800 mg tablets in half as part of your dose, the extra half tablet should be used for the next dose. If you have not used the extra half tablet within 28 days, it should be thrown away. A special MedGuide will be given to you by the pharmacist with each prescription and refill. Be sure to read this information carefully each time. Talk to your pediatrician regarding the use of this medicine in children. Special care may be needed. Overdosage: If you think you have taken too much of this medicine contact a poison control center or emergency room at once. NOTE: This medicine is only for you. Do not share this medicine with others. What if I miss a dose? If you miss a dose, take it as soon as you can. If it is almost time for your next dose, take only that dose. Do not take double or extra  doses. What may interact with this medicine? Do not take this medicine with any of the following medications: -other gabapentin products This medicine may also interact with the following medications: -alcohol -antacids -antihistamines for allergy, cough and cold -certain medicines for anxiety or sleep -certain medicines for depression or psychotic disturbances -homatropine; hydrocodone -naproxen -narcotic medicines (opiates) for pain -phenothiazines like chlorpromazine, mesoridazine, prochlorperazine, thioridazine This list may not describe all possible interactions. Give your health care provider a list of all the medicines, herbs, non-prescription drugs, or dietary supplements you use. Also tell them if you smoke, drink alcohol, or use illegal drugs. Some items may interact with your medicine. What should I watch for while using this medicine? Visit your doctor or health care professional for regular checks on your progress. You may want to keep a record at home of how you feel your condition is responding to treatment. You may want to share this information with your doctor or health care professional at each visit. You should contact your doctor or health care professional if your seizures get worse or if you have any new types of seizures. Do not stop taking this medicine or any of your seizure medicines unless instructed by your doctor or health care professional. Stopping your medicine suddenly can increase your seizures or their severity. Wear a medical identification bracelet or chain if you are taking this medicine for seizures, and carry a card that lists all your medications. You may get drowsy, dizzy, or have blurred   vision. Do not drive, use machinery, or do anything that needs mental alertness until you know how this medicine affects you. To reduce dizzy or fainting spells, do not sit or stand up quickly, especially if you are an older patient. Alcohol can increase drowsiness and  dizziness. Avoid alcoholic drinks. Your mouth may get dry. Chewing sugarless gum or sucking hard candy, and drinking plenty of water will help. The use of this medicine may increase the chance of suicidal thoughts or actions. Pay special attention to how you are responding while on this medicine. Any worsening of mood, or thoughts of suicide or dying should be reported to your health care professional right away. Women who become pregnant while using this medicine may enroll in the [place] American Antiepileptic Drug Pregnancy Registry by calling 1-888-233-2334. This registry collects information about the safety of antiepileptic drug use during pregnancy. What side effects may I notice from receiving this medicine? Side effects that you should report to your doctor or health care professional as soon as possible: -allergic reactions like skin rash, itching or hives, swelling of the face, lips, or tongue -worsening of mood, thoughts or actions of suicide or dying Side effects that usually do not require medical attention (report to your doctor or health care professional if they continue or are bothersome): -constipation -difficulty walking or controlling muscle movements -dizziness -nausea -slurred speech -tiredness -tremors -weight gain This list may not describe all possible side effects. Call your doctor for medical advice about side effects. You may report side effects to FDA at 1-800-FDA-1088. Where should I keep my medicine? Keep out of reach of children. Store at room temperature between 15 and 30 degrees C (59 and 86 degrees F). Throw away any unused medicine after the expiration date. NOTE: This sheet is a summary. It may not cover all possible information. If you have questions about this medicine, talk to your doctor, pharmacist, or health care provider.  2015, Elsevier/Gold Standard. (2012-12-20 09:12:48)  

## 2013-12-26 ENCOUNTER — Ambulatory Visit (INDEPENDENT_AMBULATORY_CARE_PROVIDER_SITE_OTHER): Payer: BC Managed Care – PPO | Admitting: Neurology

## 2013-12-26 DIAGNOSIS — N186 End stage renal disease: Secondary | ICD-10-CM

## 2013-12-26 DIAGNOSIS — G4733 Obstructive sleep apnea (adult) (pediatric): Secondary | ICD-10-CM

## 2013-12-26 DIAGNOSIS — R0902 Hypoxemia: Secondary | ICD-10-CM

## 2013-12-26 DIAGNOSIS — R0683 Snoring: Secondary | ICD-10-CM

## 2013-12-26 DIAGNOSIS — Z992 Dependence on renal dialysis: Secondary | ICD-10-CM

## 2013-12-26 DIAGNOSIS — J45909 Unspecified asthma, uncomplicated: Secondary | ICD-10-CM

## 2013-12-26 DIAGNOSIS — G4710 Hypersomnia, unspecified: Secondary | ICD-10-CM

## 2013-12-26 DIAGNOSIS — G4730 Sleep apnea, unspecified: Secondary | ICD-10-CM

## 2014-01-10 ENCOUNTER — Telehealth: Payer: Self-pay | Admitting: Internal Medicine

## 2014-01-10 NOTE — Telephone Encounter (Signed)
New message     Want Dr Caryl Comes to call him in some fluid pills at [facility] in [place].  He has fluid around his ankles.

## 2014-01-10 NOTE — Telephone Encounter (Signed)
Advised that dialysis is responsible for pulling off fluid. We do not practice giving a ESRD dialysis patient fluid medication. Recommended compression hose. Patient verbalized understanding.

## 2014-01-20 ENCOUNTER — Telehealth: Payer: Self-pay | Admitting: *Deleted

## 2014-01-20 ENCOUNTER — Other Ambulatory Visit: Payer: Self-pay | Admitting: Neurology

## 2014-01-20 DIAGNOSIS — G4733 Obstructive sleep apnea (adult) (pediatric): Secondary | ICD-10-CM

## 2014-01-20 NOTE — Telephone Encounter (Signed)
Patient returned phone call and was notified of the positive finding for the presence of sleep apnea.  Patient was instructed that a CPAP titration study had been ordered by the doctor.  Patient was transferred to [place] and a CPAP titration appointment was scheduled.  Patient was advised that a copy of his test results would be mailed to him and a copy would be sent to the referring MD.

## 2014-02-02 ENCOUNTER — Ambulatory Visit (INDEPENDENT_AMBULATORY_CARE_PROVIDER_SITE_OTHER): Payer: BC Managed Care – PPO

## 2014-02-02 DIAGNOSIS — G4733 Obstructive sleep apnea (adult) (pediatric): Secondary | ICD-10-CM

## 2014-02-04 ENCOUNTER — Telehealth: Payer: Self-pay | Admitting: *Deleted

## 2014-02-04 NOTE — Telephone Encounter (Signed)
Patient states that medication Gabapentin 100 mg is causing his ankles to swell, patient would like something else called into his pharmacy [facility] in [place].

## 2014-02-05 MED ORDER — PREGABALIN 50 MG PO CAPS: 50.0000 mg | ORAL_CAPSULE | Freq: Every day | ORAL | Status: DC

## 2014-02-05 NOTE — Telephone Encounter (Signed)
A known side effect of neurontin, yet not expected at this rater low dose.  Alternatives are Lyrica and cymbalta. Patient chose LYRICA - 50 mg nightly [facility] with 3 refills. CD

## 2014-02-17 ENCOUNTER — Encounter: Payer: Self-pay | Admitting: *Deleted

## 2014-02-17 ENCOUNTER — Other Ambulatory Visit: Payer: Self-pay | Admitting: Neurology

## 2014-02-17 ENCOUNTER — Telehealth: Payer: Self-pay | Admitting: *Deleted

## 2014-02-17 DIAGNOSIS — G4733 Obstructive sleep apnea (adult) (pediatric): Secondary | ICD-10-CM

## 2014-02-17 NOTE — Telephone Encounter (Signed)
Called and left patient a VM to contact our office regarding his CPAP study results.

## 2014-02-17 NOTE — Telephone Encounter (Signed)
Patient was contacted and provided the results of his overnight CPAP titration study which was effective in treating his sleep apnea.  Patient requested [facility] as his DME and he was referred to them for DME set up.  Patient was mailed a copy of the results and Dr. [name] was faxed a copy of the report.   Patient instructed to contact our office 6-8 weeks post set up to schedule a follow up appointment.

## 2014-03-24 ENCOUNTER — Encounter: Payer: Self-pay | Admitting: Neurology

## 2014-03-24 ENCOUNTER — Ambulatory Visit (INDEPENDENT_AMBULATORY_CARE_PROVIDER_SITE_OTHER): Payer: BC Managed Care – PPO | Admitting: Neurology

## 2014-03-24 VITALS — BP 145/88 | HR 73 | Temp 98.7°F | Resp 16 | Ht 68.75 in | Wt 198.0 lb

## 2014-03-24 DIAGNOSIS — N185 Chronic kidney disease, stage 5: Secondary | ICD-10-CM

## 2014-03-24 DIAGNOSIS — N184 Chronic kidney disease, stage 4 (severe): Secondary | ICD-10-CM

## 2014-03-24 DIAGNOSIS — N189 Chronic kidney disease, unspecified: Secondary | ICD-10-CM

## 2014-03-24 DIAGNOSIS — N182 Chronic kidney disease, stage 2 (mild): Secondary | ICD-10-CM

## 2014-03-24 DIAGNOSIS — D638 Anemia in other chronic diseases classified elsewhere: Secondary | ICD-10-CM

## 2014-03-24 DIAGNOSIS — N181 Chronic kidney disease, stage 1: Secondary | ICD-10-CM

## 2014-03-24 DIAGNOSIS — N183 Chronic kidney disease, stage 3 (moderate): Secondary | ICD-10-CM

## 2014-03-24 DIAGNOSIS — I120 Hypertensive chronic kidney disease with stage 5 chronic kidney disease or end stage renal disease: Secondary | ICD-10-CM

## 2014-03-24 DIAGNOSIS — G4733 Obstructive sleep apnea (adult) (pediatric): Secondary | ICD-10-CM

## 2014-03-24 DIAGNOSIS — R0902 Hypoxemia: Secondary | ICD-10-CM

## 2014-03-24 DIAGNOSIS — Z9989 Dependence on other enabling machines and devices: Principal | ICD-10-CM

## 2014-03-24 HISTORY — DX: Obstructive sleep apnea (adult) (pediatric): G47.33

## 2014-03-24 NOTE — Patient Instructions (Signed)
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
## 2014-04-10 ENCOUNTER — Encounter (HOSPITAL_COMMUNITY): Payer: Self-pay | Admitting: Interventional Cardiology

## 2014-04-10 ENCOUNTER — Encounter: Payer: Self-pay | Admitting: Neurology

## 2014-04-22 ENCOUNTER — Ambulatory Visit: Payer: BC Managed Care – PPO | Admitting: Adult Health

## 2014-06-03 DIAGNOSIS — R6882 Decreased libido: Secondary | ICD-10-CM | POA: Diagnosis not present

## 2014-06-04 ENCOUNTER — Telehealth: Payer: Self-pay | Admitting: Neurology

## 2014-06-04 NOTE — Telephone Encounter (Signed)
Results review for overnight pulse oximetry on the patient Michael [name], date of birth [birth year]. The study was performed on room air and over a period of 2 nights. A second night 04-11-14 a similar pulse oximetry was performed on a nondialysis day for this hemodialysis patient. Here again time spent at or below 89% oxygenation was 5 minutes and 20 seconds. Pulse rate remained between the 50s and 60s beats per minute.   A valid test time of 18 hours and 12 minutes was observed with the mean oxygen saturation level of 94.5%. Average heart rate 47 bpm. The patient spent 21 minutes 52 seconds with oxygen saturations lower or at 89% this would not qualify him for oxygen by Medicare criteria.  It was however evident that the patient had similar results for a nondialysis night and a hemodialysis night.  Larey Seat, MD

## 2014-06-17 DIAGNOSIS — J209 Acute bronchitis, unspecified: Secondary | ICD-10-CM | POA: Diagnosis not present

## 2014-06-30 DIAGNOSIS — Z992 Dependence on renal dialysis: Secondary | ICD-10-CM | POA: Diagnosis not present

## 2014-06-30 DIAGNOSIS — N186 End stage renal disease: Secondary | ICD-10-CM | POA: Diagnosis not present

## 2014-07-07 DIAGNOSIS — J984 Other disorders of lung: Secondary | ICD-10-CM | POA: Diagnosis not present

## 2014-07-07 DIAGNOSIS — J90 Pleural effusion, not elsewhere classified: Secondary | ICD-10-CM | POA: Diagnosis not present

## 2014-07-07 DIAGNOSIS — R05 Cough: Secondary | ICD-10-CM | POA: Diagnosis not present

## 2014-07-07 DIAGNOSIS — I517 Cardiomegaly: Secondary | ICD-10-CM | POA: Diagnosis not present

## 2014-07-07 DIAGNOSIS — R509 Fever, unspecified: Secondary | ICD-10-CM | POA: Diagnosis not present

## 2014-07-07 DIAGNOSIS — J189 Pneumonia, unspecified organism: Secondary | ICD-10-CM | POA: Diagnosis not present

## 2014-07-31 DIAGNOSIS — Z992 Dependence on renal dialysis: Secondary | ICD-10-CM | POA: Diagnosis not present

## 2014-07-31 DIAGNOSIS — Q612 Polycystic kidney, adult type: Secondary | ICD-10-CM | POA: Diagnosis not present

## 2014-07-31 DIAGNOSIS — N186 End stage renal disease: Secondary | ICD-10-CM | POA: Diagnosis not present

## 2014-08-30 DIAGNOSIS — N186 End stage renal disease: Secondary | ICD-10-CM | POA: Diagnosis not present

## 2014-08-30 DIAGNOSIS — Z992 Dependence on renal dialysis: Secondary | ICD-10-CM | POA: Diagnosis not present

## 2014-08-30 DIAGNOSIS — Q612 Polycystic kidney, adult type: Secondary | ICD-10-CM | POA: Diagnosis not present

## 2014-09-05 DIAGNOSIS — H9313 Tinnitus, bilateral: Secondary | ICD-10-CM | POA: Diagnosis not present

## 2014-09-05 DIAGNOSIS — J300 Vasomotor rhinitis: Secondary | ICD-10-CM | POA: Diagnosis not present

## 2014-09-05 DIAGNOSIS — R430 Anosmia: Secondary | ICD-10-CM | POA: Diagnosis not present

## 2014-09-05 DIAGNOSIS — H905 Unspecified sensorineural hearing loss: Secondary | ICD-10-CM | POA: Diagnosis not present

## 2014-09-30 DIAGNOSIS — Q612 Polycystic kidney, adult type: Secondary | ICD-10-CM | POA: Diagnosis not present

## 2014-09-30 DIAGNOSIS — N186 End stage renal disease: Secondary | ICD-10-CM | POA: Diagnosis not present

## 2014-09-30 DIAGNOSIS — Z992 Dependence on renal dialysis: Secondary | ICD-10-CM | POA: Diagnosis not present

## 2014-10-23 DIAGNOSIS — I871 Compression of vein: Secondary | ICD-10-CM | POA: Diagnosis not present

## 2014-10-23 DIAGNOSIS — T82858D Stenosis of vascular prosthetic devices, implants and grafts, subsequent encounter: Secondary | ICD-10-CM | POA: Diagnosis not present

## 2014-10-23 DIAGNOSIS — T82858A Stenosis of vascular prosthetic devices, implants and grafts, initial encounter: Secondary | ICD-10-CM | POA: Diagnosis not present

## 2014-10-23 DIAGNOSIS — N186 End stage renal disease: Secondary | ICD-10-CM | POA: Diagnosis not present

## 2014-10-23 DIAGNOSIS — Z992 Dependence on renal dialysis: Secondary | ICD-10-CM | POA: Diagnosis not present

## 2014-10-29 ENCOUNTER — Encounter: Payer: Self-pay | Admitting: Vascular Surgery

## 2014-10-30 DIAGNOSIS — Z992 Dependence on renal dialysis: Secondary | ICD-10-CM | POA: Diagnosis not present

## 2014-10-30 DIAGNOSIS — N186 End stage renal disease: Secondary | ICD-10-CM | POA: Diagnosis not present

## 2014-10-30 DIAGNOSIS — Q612 Polycystic kidney, adult type: Secondary | ICD-10-CM | POA: Diagnosis not present

## 2014-10-31 DIAGNOSIS — N186 End stage renal disease: Secondary | ICD-10-CM | POA: Diagnosis not present

## 2014-10-31 DIAGNOSIS — N2581 Secondary hyperparathyroidism of renal origin: Secondary | ICD-10-CM | POA: Diagnosis not present

## 2014-11-03 DIAGNOSIS — N186 End stage renal disease: Secondary | ICD-10-CM | POA: Diagnosis not present

## 2014-11-03 DIAGNOSIS — N2581 Secondary hyperparathyroidism of renal origin: Secondary | ICD-10-CM | POA: Diagnosis not present

## 2014-11-04 ENCOUNTER — Ambulatory Visit: Payer: Medicare Other | Admitting: Vascular Surgery

## 2014-11-05 DIAGNOSIS — N186 End stage renal disease: Secondary | ICD-10-CM | POA: Diagnosis not present

## 2014-11-05 DIAGNOSIS — N2581 Secondary hyperparathyroidism of renal origin: Secondary | ICD-10-CM | POA: Diagnosis not present

## 2014-11-07 DIAGNOSIS — N186 End stage renal disease: Secondary | ICD-10-CM | POA: Diagnosis not present

## 2014-11-07 DIAGNOSIS — N2581 Secondary hyperparathyroidism of renal origin: Secondary | ICD-10-CM | POA: Diagnosis not present

## 2014-11-10 DIAGNOSIS — N186 End stage renal disease: Secondary | ICD-10-CM | POA: Diagnosis not present

## 2014-11-10 DIAGNOSIS — N2581 Secondary hyperparathyroidism of renal origin: Secondary | ICD-10-CM | POA: Diagnosis not present

## 2014-11-12 DIAGNOSIS — N2581 Secondary hyperparathyroidism of renal origin: Secondary | ICD-10-CM | POA: Diagnosis not present

## 2014-11-12 DIAGNOSIS — N186 End stage renal disease: Secondary | ICD-10-CM | POA: Diagnosis not present

## 2014-11-14 DIAGNOSIS — N186 End stage renal disease: Secondary | ICD-10-CM | POA: Diagnosis not present

## 2014-11-14 DIAGNOSIS — N2581 Secondary hyperparathyroidism of renal origin: Secondary | ICD-10-CM | POA: Diagnosis not present

## 2014-11-17 DIAGNOSIS — N2581 Secondary hyperparathyroidism of renal origin: Secondary | ICD-10-CM | POA: Diagnosis not present

## 2014-11-17 DIAGNOSIS — N186 End stage renal disease: Secondary | ICD-10-CM | POA: Diagnosis not present

## 2014-11-19 DIAGNOSIS — N2581 Secondary hyperparathyroidism of renal origin: Secondary | ICD-10-CM | POA: Diagnosis not present

## 2014-11-19 DIAGNOSIS — N186 End stage renal disease: Secondary | ICD-10-CM | POA: Diagnosis not present

## 2014-11-21 DIAGNOSIS — N186 End stage renal disease: Secondary | ICD-10-CM | POA: Diagnosis not present

## 2014-11-21 DIAGNOSIS — N2581 Secondary hyperparathyroidism of renal origin: Secondary | ICD-10-CM | POA: Diagnosis not present

## 2014-11-24 DIAGNOSIS — N2581 Secondary hyperparathyroidism of renal origin: Secondary | ICD-10-CM | POA: Diagnosis not present

## 2014-11-24 DIAGNOSIS — N186 End stage renal disease: Secondary | ICD-10-CM | POA: Diagnosis not present

## 2014-11-26 DIAGNOSIS — N2581 Secondary hyperparathyroidism of renal origin: Secondary | ICD-10-CM | POA: Diagnosis not present

## 2014-11-26 DIAGNOSIS — N186 End stage renal disease: Secondary | ICD-10-CM | POA: Diagnosis not present

## 2014-11-28 DIAGNOSIS — N2581 Secondary hyperparathyroidism of renal origin: Secondary | ICD-10-CM | POA: Diagnosis not present

## 2014-11-28 DIAGNOSIS — N186 End stage renal disease: Secondary | ICD-10-CM | POA: Diagnosis not present

## 2014-11-30 DIAGNOSIS — Q612 Polycystic kidney, adult type: Secondary | ICD-10-CM | POA: Diagnosis not present

## 2014-11-30 DIAGNOSIS — Z992 Dependence on renal dialysis: Secondary | ICD-10-CM | POA: Diagnosis not present

## 2014-11-30 DIAGNOSIS — N186 End stage renal disease: Secondary | ICD-10-CM | POA: Diagnosis not present

## 2014-12-01 DIAGNOSIS — N186 End stage renal disease: Secondary | ICD-10-CM | POA: Diagnosis not present

## 2014-12-01 DIAGNOSIS — N2581 Secondary hyperparathyroidism of renal origin: Secondary | ICD-10-CM | POA: Diagnosis not present

## 2014-12-03 DIAGNOSIS — N2581 Secondary hyperparathyroidism of renal origin: Secondary | ICD-10-CM | POA: Diagnosis not present

## 2014-12-03 DIAGNOSIS — N186 End stage renal disease: Secondary | ICD-10-CM | POA: Diagnosis not present

## 2014-12-05 DIAGNOSIS — N2581 Secondary hyperparathyroidism of renal origin: Secondary | ICD-10-CM | POA: Diagnosis not present

## 2014-12-05 DIAGNOSIS — N186 End stage renal disease: Secondary | ICD-10-CM | POA: Diagnosis not present

## 2014-12-08 DIAGNOSIS — N2581 Secondary hyperparathyroidism of renal origin: Secondary | ICD-10-CM | POA: Diagnosis not present

## 2014-12-08 DIAGNOSIS — N186 End stage renal disease: Secondary | ICD-10-CM | POA: Diagnosis not present

## 2014-12-10 DIAGNOSIS — N2581 Secondary hyperparathyroidism of renal origin: Secondary | ICD-10-CM | POA: Diagnosis not present

## 2014-12-10 DIAGNOSIS — N186 End stage renal disease: Secondary | ICD-10-CM | POA: Diagnosis not present

## 2014-12-12 DIAGNOSIS — N2581 Secondary hyperparathyroidism of renal origin: Secondary | ICD-10-CM | POA: Diagnosis not present

## 2014-12-12 DIAGNOSIS — N186 End stage renal disease: Secondary | ICD-10-CM | POA: Diagnosis not present

## 2014-12-15 DIAGNOSIS — N186 End stage renal disease: Secondary | ICD-10-CM | POA: Diagnosis not present

## 2014-12-15 DIAGNOSIS — N2581 Secondary hyperparathyroidism of renal origin: Secondary | ICD-10-CM | POA: Diagnosis not present

## 2014-12-17 DIAGNOSIS — N2581 Secondary hyperparathyroidism of renal origin: Secondary | ICD-10-CM | POA: Diagnosis not present

## 2014-12-17 DIAGNOSIS — N186 End stage renal disease: Secondary | ICD-10-CM | POA: Diagnosis not present

## 2014-12-19 DIAGNOSIS — N2581 Secondary hyperparathyroidism of renal origin: Secondary | ICD-10-CM | POA: Diagnosis not present

## 2014-12-19 DIAGNOSIS — N186 End stage renal disease: Secondary | ICD-10-CM | POA: Diagnosis not present

## 2014-12-22 DIAGNOSIS — N186 End stage renal disease: Secondary | ICD-10-CM | POA: Diagnosis not present

## 2014-12-22 DIAGNOSIS — N2581 Secondary hyperparathyroidism of renal origin: Secondary | ICD-10-CM | POA: Diagnosis not present

## 2014-12-24 DIAGNOSIS — N186 End stage renal disease: Secondary | ICD-10-CM | POA: Diagnosis not present

## 2014-12-24 DIAGNOSIS — N2581 Secondary hyperparathyroidism of renal origin: Secondary | ICD-10-CM | POA: Diagnosis not present

## 2014-12-26 DIAGNOSIS — N2581 Secondary hyperparathyroidism of renal origin: Secondary | ICD-10-CM | POA: Diagnosis not present

## 2014-12-26 DIAGNOSIS — N186 End stage renal disease: Secondary | ICD-10-CM | POA: Diagnosis not present

## 2014-12-29 DIAGNOSIS — N2581 Secondary hyperparathyroidism of renal origin: Secondary | ICD-10-CM | POA: Diagnosis not present

## 2014-12-29 DIAGNOSIS — N186 End stage renal disease: Secondary | ICD-10-CM | POA: Diagnosis not present

## 2014-12-31 DIAGNOSIS — N186 End stage renal disease: Secondary | ICD-10-CM | POA: Diagnosis not present

## 2014-12-31 DIAGNOSIS — Q612 Polycystic kidney, adult type: Secondary | ICD-10-CM | POA: Diagnosis not present

## 2014-12-31 DIAGNOSIS — Z992 Dependence on renal dialysis: Secondary | ICD-10-CM | POA: Diagnosis not present

## 2014-12-31 DIAGNOSIS — N2581 Secondary hyperparathyroidism of renal origin: Secondary | ICD-10-CM | POA: Diagnosis not present

## 2015-01-03 DIAGNOSIS — N2581 Secondary hyperparathyroidism of renal origin: Secondary | ICD-10-CM | POA: Diagnosis not present

## 2015-01-03 DIAGNOSIS — N186 End stage renal disease: Secondary | ICD-10-CM | POA: Diagnosis not present

## 2015-01-05 DIAGNOSIS — N186 End stage renal disease: Secondary | ICD-10-CM | POA: Diagnosis not present

## 2015-01-05 DIAGNOSIS — N2581 Secondary hyperparathyroidism of renal origin: Secondary | ICD-10-CM | POA: Diagnosis not present

## 2015-01-07 DIAGNOSIS — N186 End stage renal disease: Secondary | ICD-10-CM | POA: Diagnosis not present

## 2015-01-07 DIAGNOSIS — N2581 Secondary hyperparathyroidism of renal origin: Secondary | ICD-10-CM | POA: Diagnosis not present

## 2015-01-09 DIAGNOSIS — N186 End stage renal disease: Secondary | ICD-10-CM | POA: Diagnosis not present

## 2015-01-09 DIAGNOSIS — N2581 Secondary hyperparathyroidism of renal origin: Secondary | ICD-10-CM | POA: Diagnosis not present

## 2015-01-12 DIAGNOSIS — N2581 Secondary hyperparathyroidism of renal origin: Secondary | ICD-10-CM | POA: Diagnosis not present

## 2015-01-12 DIAGNOSIS — N186 End stage renal disease: Secondary | ICD-10-CM | POA: Diagnosis not present

## 2015-01-14 DIAGNOSIS — N2581 Secondary hyperparathyroidism of renal origin: Secondary | ICD-10-CM | POA: Diagnosis not present

## 2015-01-14 DIAGNOSIS — N186 End stage renal disease: Secondary | ICD-10-CM | POA: Diagnosis not present

## 2015-01-16 DIAGNOSIS — N186 End stage renal disease: Secondary | ICD-10-CM | POA: Diagnosis not present

## 2015-01-16 DIAGNOSIS — N2581 Secondary hyperparathyroidism of renal origin: Secondary | ICD-10-CM | POA: Diagnosis not present

## 2015-01-19 DIAGNOSIS — N2581 Secondary hyperparathyroidism of renal origin: Secondary | ICD-10-CM | POA: Diagnosis not present

## 2015-01-19 DIAGNOSIS — N186 End stage renal disease: Secondary | ICD-10-CM | POA: Diagnosis not present

## 2015-01-21 DIAGNOSIS — N2581 Secondary hyperparathyroidism of renal origin: Secondary | ICD-10-CM | POA: Diagnosis not present

## 2015-01-21 DIAGNOSIS — N186 End stage renal disease: Secondary | ICD-10-CM | POA: Diagnosis not present

## 2015-01-22 DIAGNOSIS — N186 End stage renal disease: Secondary | ICD-10-CM | POA: Diagnosis not present

## 2015-01-22 DIAGNOSIS — Z23 Encounter for immunization: Secondary | ICD-10-CM | POA: Diagnosis not present

## 2015-01-22 DIAGNOSIS — I120 Hypertensive chronic kidney disease with stage 5 chronic kidney disease or end stage renal disease: Secondary | ICD-10-CM | POA: Diagnosis not present

## 2015-01-23 DIAGNOSIS — N2581 Secondary hyperparathyroidism of renal origin: Secondary | ICD-10-CM | POA: Diagnosis not present

## 2015-01-23 DIAGNOSIS — N186 End stage renal disease: Secondary | ICD-10-CM | POA: Diagnosis not present

## 2015-01-26 DIAGNOSIS — N2581 Secondary hyperparathyroidism of renal origin: Secondary | ICD-10-CM | POA: Diagnosis not present

## 2015-01-26 DIAGNOSIS — N186 End stage renal disease: Secondary | ICD-10-CM | POA: Diagnosis not present

## 2015-01-28 DIAGNOSIS — N2581 Secondary hyperparathyroidism of renal origin: Secondary | ICD-10-CM | POA: Diagnosis not present

## 2015-01-28 DIAGNOSIS — N186 End stage renal disease: Secondary | ICD-10-CM | POA: Diagnosis not present

## 2015-01-30 DIAGNOSIS — N2581 Secondary hyperparathyroidism of renal origin: Secondary | ICD-10-CM | POA: Diagnosis not present

## 2015-01-30 DIAGNOSIS — Z992 Dependence on renal dialysis: Secondary | ICD-10-CM | POA: Diagnosis not present

## 2015-01-30 DIAGNOSIS — Q612 Polycystic kidney, adult type: Secondary | ICD-10-CM | POA: Diagnosis not present

## 2015-01-30 DIAGNOSIS — N186 End stage renal disease: Secondary | ICD-10-CM | POA: Diagnosis not present

## 2015-02-02 DIAGNOSIS — N186 End stage renal disease: Secondary | ICD-10-CM | POA: Diagnosis not present

## 2015-02-06 DIAGNOSIS — N186 End stage renal disease: Secondary | ICD-10-CM | POA: Diagnosis not present

## 2015-02-09 DIAGNOSIS — N186 End stage renal disease: Secondary | ICD-10-CM | POA: Diagnosis not present

## 2015-02-11 DIAGNOSIS — N186 End stage renal disease: Secondary | ICD-10-CM | POA: Diagnosis not present

## 2015-02-13 DIAGNOSIS — N186 End stage renal disease: Secondary | ICD-10-CM | POA: Diagnosis not present

## 2015-02-16 DIAGNOSIS — N186 End stage renal disease: Secondary | ICD-10-CM | POA: Diagnosis not present

## 2015-02-18 DIAGNOSIS — N186 End stage renal disease: Secondary | ICD-10-CM | POA: Diagnosis not present

## 2015-02-20 DIAGNOSIS — N186 End stage renal disease: Secondary | ICD-10-CM | POA: Diagnosis not present

## 2015-02-23 DIAGNOSIS — N186 End stage renal disease: Secondary | ICD-10-CM | POA: Diagnosis not present

## 2015-02-25 DIAGNOSIS — N186 End stage renal disease: Secondary | ICD-10-CM | POA: Diagnosis not present

## 2015-02-27 DIAGNOSIS — N186 End stage renal disease: Secondary | ICD-10-CM | POA: Diagnosis not present

## 2015-03-02 DIAGNOSIS — Q612 Polycystic kidney, adult type: Secondary | ICD-10-CM | POA: Diagnosis not present

## 2015-03-02 DIAGNOSIS — N186 End stage renal disease: Secondary | ICD-10-CM | POA: Diagnosis not present

## 2015-03-02 DIAGNOSIS — Z992 Dependence on renal dialysis: Secondary | ICD-10-CM | POA: Diagnosis not present

## 2015-03-04 DIAGNOSIS — N186 End stage renal disease: Secondary | ICD-10-CM | POA: Diagnosis not present

## 2015-03-06 DIAGNOSIS — N186 End stage renal disease: Secondary | ICD-10-CM | POA: Diagnosis not present

## 2015-03-09 DIAGNOSIS — N186 End stage renal disease: Secondary | ICD-10-CM | POA: Diagnosis not present

## 2015-03-11 DIAGNOSIS — N186 End stage renal disease: Secondary | ICD-10-CM | POA: Diagnosis not present

## 2015-03-13 DIAGNOSIS — N186 End stage renal disease: Secondary | ICD-10-CM | POA: Diagnosis not present

## 2015-03-16 DIAGNOSIS — N186 End stage renal disease: Secondary | ICD-10-CM | POA: Diagnosis not present

## 2015-03-18 DIAGNOSIS — N186 End stage renal disease: Secondary | ICD-10-CM | POA: Diagnosis not present

## 2015-03-20 DIAGNOSIS — N186 End stage renal disease: Secondary | ICD-10-CM | POA: Diagnosis not present

## 2015-03-23 DIAGNOSIS — N186 End stage renal disease: Secondary | ICD-10-CM | POA: Diagnosis not present

## 2015-03-25 DIAGNOSIS — N186 End stage renal disease: Secondary | ICD-10-CM | POA: Diagnosis not present

## 2015-03-28 DIAGNOSIS — N186 End stage renal disease: Secondary | ICD-10-CM | POA: Diagnosis not present

## 2015-04-01 DIAGNOSIS — Z992 Dependence on renal dialysis: Secondary | ICD-10-CM | POA: Diagnosis not present

## 2015-04-01 DIAGNOSIS — Q612 Polycystic kidney, adult type: Secondary | ICD-10-CM | POA: Diagnosis not present

## 2015-04-01 DIAGNOSIS — N186 End stage renal disease: Secondary | ICD-10-CM | POA: Diagnosis not present

## 2015-04-03 DIAGNOSIS — N186 End stage renal disease: Secondary | ICD-10-CM | POA: Diagnosis not present

## 2015-04-03 DIAGNOSIS — D509 Iron deficiency anemia, unspecified: Secondary | ICD-10-CM | POA: Diagnosis not present

## 2015-04-03 DIAGNOSIS — N2581 Secondary hyperparathyroidism of renal origin: Secondary | ICD-10-CM | POA: Diagnosis not present

## 2015-04-06 DIAGNOSIS — N186 End stage renal disease: Secondary | ICD-10-CM | POA: Diagnosis not present

## 2015-04-06 DIAGNOSIS — N2581 Secondary hyperparathyroidism of renal origin: Secondary | ICD-10-CM | POA: Diagnosis not present

## 2015-04-06 DIAGNOSIS — D509 Iron deficiency anemia, unspecified: Secondary | ICD-10-CM | POA: Diagnosis not present

## 2015-04-08 DIAGNOSIS — N186 End stage renal disease: Secondary | ICD-10-CM | POA: Diagnosis not present

## 2015-04-08 DIAGNOSIS — D509 Iron deficiency anemia, unspecified: Secondary | ICD-10-CM | POA: Diagnosis not present

## 2015-04-08 DIAGNOSIS — N2581 Secondary hyperparathyroidism of renal origin: Secondary | ICD-10-CM | POA: Diagnosis not present

## 2015-04-10 DIAGNOSIS — N186 End stage renal disease: Secondary | ICD-10-CM | POA: Diagnosis not present

## 2015-04-10 DIAGNOSIS — N2581 Secondary hyperparathyroidism of renal origin: Secondary | ICD-10-CM | POA: Diagnosis not present

## 2015-04-10 DIAGNOSIS — D509 Iron deficiency anemia, unspecified: Secondary | ICD-10-CM | POA: Diagnosis not present

## 2015-04-12 DIAGNOSIS — D509 Iron deficiency anemia, unspecified: Secondary | ICD-10-CM | POA: Diagnosis not present

## 2015-04-12 DIAGNOSIS — N186 End stage renal disease: Secondary | ICD-10-CM | POA: Diagnosis not present

## 2015-04-12 DIAGNOSIS — N2581 Secondary hyperparathyroidism of renal origin: Secondary | ICD-10-CM | POA: Diagnosis not present

## 2015-04-13 DIAGNOSIS — N2581 Secondary hyperparathyroidism of renal origin: Secondary | ICD-10-CM | POA: Diagnosis not present

## 2015-04-13 DIAGNOSIS — N186 End stage renal disease: Secondary | ICD-10-CM | POA: Diagnosis not present

## 2015-04-13 DIAGNOSIS — D509 Iron deficiency anemia, unspecified: Secondary | ICD-10-CM | POA: Diagnosis not present

## 2015-04-15 DIAGNOSIS — N2581 Secondary hyperparathyroidism of renal origin: Secondary | ICD-10-CM | POA: Diagnosis not present

## 2015-04-15 DIAGNOSIS — N186 End stage renal disease: Secondary | ICD-10-CM | POA: Diagnosis not present

## 2015-04-15 DIAGNOSIS — D509 Iron deficiency anemia, unspecified: Secondary | ICD-10-CM | POA: Diagnosis not present

## 2015-04-17 DIAGNOSIS — D509 Iron deficiency anemia, unspecified: Secondary | ICD-10-CM | POA: Diagnosis not present

## 2015-04-17 DIAGNOSIS — N2581 Secondary hyperparathyroidism of renal origin: Secondary | ICD-10-CM | POA: Diagnosis not present

## 2015-04-17 DIAGNOSIS — N186 End stage renal disease: Secondary | ICD-10-CM | POA: Diagnosis not present

## 2015-04-20 DIAGNOSIS — D509 Iron deficiency anemia, unspecified: Secondary | ICD-10-CM | POA: Diagnosis not present

## 2015-04-20 DIAGNOSIS — N186 End stage renal disease: Secondary | ICD-10-CM | POA: Diagnosis not present

## 2015-04-20 DIAGNOSIS — N2581 Secondary hyperparathyroidism of renal origin: Secondary | ICD-10-CM | POA: Diagnosis not present

## 2015-04-22 DIAGNOSIS — N186 End stage renal disease: Secondary | ICD-10-CM | POA: Diagnosis not present

## 2015-04-22 DIAGNOSIS — N2581 Secondary hyperparathyroidism of renal origin: Secondary | ICD-10-CM | POA: Diagnosis not present

## 2015-04-22 DIAGNOSIS — D509 Iron deficiency anemia, unspecified: Secondary | ICD-10-CM | POA: Diagnosis not present

## 2015-04-24 DIAGNOSIS — D509 Iron deficiency anemia, unspecified: Secondary | ICD-10-CM | POA: Diagnosis not present

## 2015-04-24 DIAGNOSIS — N2581 Secondary hyperparathyroidism of renal origin: Secondary | ICD-10-CM | POA: Diagnosis not present

## 2015-04-24 DIAGNOSIS — N186 End stage renal disease: Secondary | ICD-10-CM | POA: Diagnosis not present

## 2015-04-27 DIAGNOSIS — N186 End stage renal disease: Secondary | ICD-10-CM | POA: Diagnosis not present

## 2015-04-27 DIAGNOSIS — N2581 Secondary hyperparathyroidism of renal origin: Secondary | ICD-10-CM | POA: Diagnosis not present

## 2015-04-27 DIAGNOSIS — D509 Iron deficiency anemia, unspecified: Secondary | ICD-10-CM | POA: Diagnosis not present

## 2015-04-29 DIAGNOSIS — D509 Iron deficiency anemia, unspecified: Secondary | ICD-10-CM | POA: Diagnosis not present

## 2015-04-29 DIAGNOSIS — N2581 Secondary hyperparathyroidism of renal origin: Secondary | ICD-10-CM | POA: Diagnosis not present

## 2015-04-29 DIAGNOSIS — N186 End stage renal disease: Secondary | ICD-10-CM | POA: Diagnosis not present

## 2015-05-01 DIAGNOSIS — N2581 Secondary hyperparathyroidism of renal origin: Secondary | ICD-10-CM | POA: Diagnosis not present

## 2015-05-01 DIAGNOSIS — D509 Iron deficiency anemia, unspecified: Secondary | ICD-10-CM | POA: Diagnosis not present

## 2015-05-01 DIAGNOSIS — N186 End stage renal disease: Secondary | ICD-10-CM | POA: Diagnosis not present

## 2015-05-02 DIAGNOSIS — Q612 Polycystic kidney, adult type: Secondary | ICD-10-CM | POA: Diagnosis not present

## 2015-05-02 DIAGNOSIS — N186 End stage renal disease: Secondary | ICD-10-CM | POA: Diagnosis not present

## 2015-05-02 DIAGNOSIS — Z992 Dependence on renal dialysis: Secondary | ICD-10-CM | POA: Diagnosis not present

## 2015-05-04 DIAGNOSIS — N186 End stage renal disease: Secondary | ICD-10-CM | POA: Diagnosis not present

## 2015-05-04 DIAGNOSIS — N2581 Secondary hyperparathyroidism of renal origin: Secondary | ICD-10-CM | POA: Diagnosis not present

## 2015-05-06 DIAGNOSIS — N2581 Secondary hyperparathyroidism of renal origin: Secondary | ICD-10-CM | POA: Diagnosis not present

## 2015-05-06 DIAGNOSIS — N186 End stage renal disease: Secondary | ICD-10-CM | POA: Diagnosis not present

## 2015-05-08 DIAGNOSIS — N186 End stage renal disease: Secondary | ICD-10-CM | POA: Diagnosis not present

## 2015-05-08 DIAGNOSIS — N2581 Secondary hyperparathyroidism of renal origin: Secondary | ICD-10-CM | POA: Diagnosis not present

## 2015-05-11 DIAGNOSIS — N186 End stage renal disease: Secondary | ICD-10-CM | POA: Diagnosis not present

## 2015-05-11 DIAGNOSIS — N2581 Secondary hyperparathyroidism of renal origin: Secondary | ICD-10-CM | POA: Diagnosis not present

## 2015-05-13 DIAGNOSIS — N186 End stage renal disease: Secondary | ICD-10-CM | POA: Diagnosis not present

## 2015-05-13 DIAGNOSIS — N2581 Secondary hyperparathyroidism of renal origin: Secondary | ICD-10-CM | POA: Diagnosis not present

## 2015-05-15 DIAGNOSIS — N186 End stage renal disease: Secondary | ICD-10-CM | POA: Diagnosis not present

## 2015-05-15 DIAGNOSIS — N2581 Secondary hyperparathyroidism of renal origin: Secondary | ICD-10-CM | POA: Diagnosis not present

## 2015-05-18 DIAGNOSIS — N2581 Secondary hyperparathyroidism of renal origin: Secondary | ICD-10-CM | POA: Diagnosis not present

## 2015-05-18 DIAGNOSIS — N186 End stage renal disease: Secondary | ICD-10-CM | POA: Diagnosis not present

## 2015-05-20 DIAGNOSIS — N186 End stage renal disease: Secondary | ICD-10-CM | POA: Diagnosis not present

## 2015-05-20 DIAGNOSIS — N2581 Secondary hyperparathyroidism of renal origin: Secondary | ICD-10-CM | POA: Diagnosis not present

## 2015-05-22 DIAGNOSIS — N186 End stage renal disease: Secondary | ICD-10-CM | POA: Diagnosis not present

## 2015-05-22 DIAGNOSIS — N2581 Secondary hyperparathyroidism of renal origin: Secondary | ICD-10-CM | POA: Diagnosis not present

## 2015-05-25 DIAGNOSIS — N186 End stage renal disease: Secondary | ICD-10-CM | POA: Diagnosis not present

## 2015-05-25 DIAGNOSIS — N2581 Secondary hyperparathyroidism of renal origin: Secondary | ICD-10-CM | POA: Diagnosis not present

## 2015-05-27 DIAGNOSIS — N186 End stage renal disease: Secondary | ICD-10-CM | POA: Diagnosis not present

## 2015-05-27 DIAGNOSIS — N2581 Secondary hyperparathyroidism of renal origin: Secondary | ICD-10-CM | POA: Diagnosis not present

## 2015-05-29 DIAGNOSIS — N2581 Secondary hyperparathyroidism of renal origin: Secondary | ICD-10-CM | POA: Diagnosis not present

## 2015-05-29 DIAGNOSIS — N186 End stage renal disease: Secondary | ICD-10-CM | POA: Diagnosis not present

## 2015-06-01 DIAGNOSIS — N186 End stage renal disease: Secondary | ICD-10-CM | POA: Diagnosis not present

## 2015-06-01 DIAGNOSIS — N2581 Secondary hyperparathyroidism of renal origin: Secondary | ICD-10-CM | POA: Diagnosis not present

## 2015-06-02 DIAGNOSIS — Q612 Polycystic kidney, adult type: Secondary | ICD-10-CM | POA: Diagnosis not present

## 2015-06-02 DIAGNOSIS — N186 End stage renal disease: Secondary | ICD-10-CM | POA: Diagnosis not present

## 2015-06-02 DIAGNOSIS — Z992 Dependence on renal dialysis: Secondary | ICD-10-CM | POA: Diagnosis not present

## 2015-06-03 DIAGNOSIS — N2581 Secondary hyperparathyroidism of renal origin: Secondary | ICD-10-CM | POA: Diagnosis not present

## 2015-06-03 DIAGNOSIS — N186 End stage renal disease: Secondary | ICD-10-CM | POA: Diagnosis not present

## 2015-06-05 DIAGNOSIS — N186 End stage renal disease: Secondary | ICD-10-CM | POA: Diagnosis not present

## 2015-06-05 DIAGNOSIS — N2581 Secondary hyperparathyroidism of renal origin: Secondary | ICD-10-CM | POA: Diagnosis not present

## 2015-06-08 DIAGNOSIS — N2581 Secondary hyperparathyroidism of renal origin: Secondary | ICD-10-CM | POA: Diagnosis not present

## 2015-06-08 DIAGNOSIS — N186 End stage renal disease: Secondary | ICD-10-CM | POA: Diagnosis not present

## 2015-06-10 DIAGNOSIS — N186 End stage renal disease: Secondary | ICD-10-CM | POA: Diagnosis not present

## 2015-06-10 DIAGNOSIS — N2581 Secondary hyperparathyroidism of renal origin: Secondary | ICD-10-CM | POA: Diagnosis not present

## 2015-06-12 DIAGNOSIS — N2581 Secondary hyperparathyroidism of renal origin: Secondary | ICD-10-CM | POA: Diagnosis not present

## 2015-06-12 DIAGNOSIS — N186 End stage renal disease: Secondary | ICD-10-CM | POA: Diagnosis not present

## 2015-06-15 DIAGNOSIS — N186 End stage renal disease: Secondary | ICD-10-CM | POA: Diagnosis not present

## 2015-06-15 DIAGNOSIS — N2581 Secondary hyperparathyroidism of renal origin: Secondary | ICD-10-CM | POA: Diagnosis not present

## 2015-06-17 DIAGNOSIS — N2581 Secondary hyperparathyroidism of renal origin: Secondary | ICD-10-CM | POA: Diagnosis not present

## 2015-06-17 DIAGNOSIS — N186 End stage renal disease: Secondary | ICD-10-CM | POA: Diagnosis not present

## 2015-06-19 DIAGNOSIS — N186 End stage renal disease: Secondary | ICD-10-CM | POA: Diagnosis not present

## 2015-06-19 DIAGNOSIS — N2581 Secondary hyperparathyroidism of renal origin: Secondary | ICD-10-CM | POA: Diagnosis not present

## 2015-06-22 DIAGNOSIS — N2581 Secondary hyperparathyroidism of renal origin: Secondary | ICD-10-CM | POA: Diagnosis not present

## 2015-06-22 DIAGNOSIS — N186 End stage renal disease: Secondary | ICD-10-CM | POA: Diagnosis not present

## 2015-06-24 DIAGNOSIS — N186 End stage renal disease: Secondary | ICD-10-CM | POA: Diagnosis not present

## 2015-06-24 DIAGNOSIS — N2581 Secondary hyperparathyroidism of renal origin: Secondary | ICD-10-CM | POA: Diagnosis not present

## 2015-06-26 DIAGNOSIS — N2581 Secondary hyperparathyroidism of renal origin: Secondary | ICD-10-CM | POA: Diagnosis not present

## 2015-06-26 DIAGNOSIS — N186 End stage renal disease: Secondary | ICD-10-CM | POA: Diagnosis not present

## 2015-06-29 DIAGNOSIS — N2581 Secondary hyperparathyroidism of renal origin: Secondary | ICD-10-CM | POA: Diagnosis not present

## 2015-06-29 DIAGNOSIS — N186 End stage renal disease: Secondary | ICD-10-CM | POA: Diagnosis not present

## 2015-06-30 DIAGNOSIS — Q612 Polycystic kidney, adult type: Secondary | ICD-10-CM | POA: Diagnosis not present

## 2015-06-30 DIAGNOSIS — Z992 Dependence on renal dialysis: Secondary | ICD-10-CM | POA: Diagnosis not present

## 2015-06-30 DIAGNOSIS — N186 End stage renal disease: Secondary | ICD-10-CM | POA: Diagnosis not present

## 2015-07-01 DIAGNOSIS — N2581 Secondary hyperparathyroidism of renal origin: Secondary | ICD-10-CM | POA: Diagnosis not present

## 2015-07-01 DIAGNOSIS — N186 End stage renal disease: Secondary | ICD-10-CM | POA: Diagnosis not present

## 2015-07-03 DIAGNOSIS — N2581 Secondary hyperparathyroidism of renal origin: Secondary | ICD-10-CM | POA: Diagnosis not present

## 2015-07-03 DIAGNOSIS — N186 End stage renal disease: Secondary | ICD-10-CM | POA: Diagnosis not present

## 2015-07-06 DIAGNOSIS — N2581 Secondary hyperparathyroidism of renal origin: Secondary | ICD-10-CM | POA: Diagnosis not present

## 2015-07-06 DIAGNOSIS — N186 End stage renal disease: Secondary | ICD-10-CM | POA: Diagnosis not present

## 2015-07-08 DIAGNOSIS — N2581 Secondary hyperparathyroidism of renal origin: Secondary | ICD-10-CM | POA: Diagnosis not present

## 2015-07-08 DIAGNOSIS — N186 End stage renal disease: Secondary | ICD-10-CM | POA: Diagnosis not present

## 2015-07-10 DIAGNOSIS — N2581 Secondary hyperparathyroidism of renal origin: Secondary | ICD-10-CM | POA: Diagnosis not present

## 2015-07-10 DIAGNOSIS — N186 End stage renal disease: Secondary | ICD-10-CM | POA: Diagnosis not present

## 2015-07-13 DIAGNOSIS — N186 End stage renal disease: Secondary | ICD-10-CM | POA: Diagnosis not present

## 2015-07-13 DIAGNOSIS — N2581 Secondary hyperparathyroidism of renal origin: Secondary | ICD-10-CM | POA: Diagnosis not present

## 2015-07-15 DIAGNOSIS — N186 End stage renal disease: Secondary | ICD-10-CM | POA: Diagnosis not present

## 2015-07-15 DIAGNOSIS — N2581 Secondary hyperparathyroidism of renal origin: Secondary | ICD-10-CM | POA: Diagnosis not present

## 2015-07-17 DIAGNOSIS — N2581 Secondary hyperparathyroidism of renal origin: Secondary | ICD-10-CM | POA: Diagnosis not present

## 2015-07-17 DIAGNOSIS — N186 End stage renal disease: Secondary | ICD-10-CM | POA: Diagnosis not present

## 2015-07-20 DIAGNOSIS — N2581 Secondary hyperparathyroidism of renal origin: Secondary | ICD-10-CM | POA: Diagnosis not present

## 2015-07-20 DIAGNOSIS — N186 End stage renal disease: Secondary | ICD-10-CM | POA: Diagnosis not present

## 2015-07-22 DIAGNOSIS — N2581 Secondary hyperparathyroidism of renal origin: Secondary | ICD-10-CM | POA: Diagnosis not present

## 2015-07-22 DIAGNOSIS — N186 End stage renal disease: Secondary | ICD-10-CM | POA: Diagnosis not present

## 2015-07-24 DIAGNOSIS — N2581 Secondary hyperparathyroidism of renal origin: Secondary | ICD-10-CM | POA: Diagnosis not present

## 2015-07-24 DIAGNOSIS — N186 End stage renal disease: Secondary | ICD-10-CM | POA: Diagnosis not present

## 2015-07-27 DIAGNOSIS — N2581 Secondary hyperparathyroidism of renal origin: Secondary | ICD-10-CM | POA: Diagnosis not present

## 2015-07-27 DIAGNOSIS — N186 End stage renal disease: Secondary | ICD-10-CM | POA: Diagnosis not present

## 2015-07-28 DIAGNOSIS — Z992 Dependence on renal dialysis: Secondary | ICD-10-CM | POA: Diagnosis not present

## 2015-07-28 DIAGNOSIS — N186 End stage renal disease: Secondary | ICD-10-CM | POA: Diagnosis not present

## 2015-07-28 DIAGNOSIS — T82868D Thrombosis of vascular prosthetic devices, implants and grafts, subsequent encounter: Secondary | ICD-10-CM | POA: Diagnosis not present

## 2015-07-28 DIAGNOSIS — I871 Compression of vein: Secondary | ICD-10-CM | POA: Diagnosis not present

## 2015-07-29 DIAGNOSIS — N2581 Secondary hyperparathyroidism of renal origin: Secondary | ICD-10-CM | POA: Diagnosis not present

## 2015-07-29 DIAGNOSIS — N186 End stage renal disease: Secondary | ICD-10-CM | POA: Diagnosis not present

## 2015-07-31 DIAGNOSIS — Z992 Dependence on renal dialysis: Secondary | ICD-10-CM | POA: Diagnosis not present

## 2015-07-31 DIAGNOSIS — Q612 Polycystic kidney, adult type: Secondary | ICD-10-CM | POA: Diagnosis not present

## 2015-07-31 DIAGNOSIS — N186 End stage renal disease: Secondary | ICD-10-CM | POA: Diagnosis not present

## 2015-08-03 DIAGNOSIS — N186 End stage renal disease: Secondary | ICD-10-CM | POA: Diagnosis not present

## 2015-08-03 DIAGNOSIS — N2581 Secondary hyperparathyroidism of renal origin: Secondary | ICD-10-CM | POA: Diagnosis not present

## 2015-08-05 DIAGNOSIS — N186 End stage renal disease: Secondary | ICD-10-CM | POA: Diagnosis not present

## 2015-08-05 DIAGNOSIS — N2581 Secondary hyperparathyroidism of renal origin: Secondary | ICD-10-CM | POA: Diagnosis not present

## 2015-08-07 DIAGNOSIS — N2581 Secondary hyperparathyroidism of renal origin: Secondary | ICD-10-CM | POA: Diagnosis not present

## 2015-08-07 DIAGNOSIS — N186 End stage renal disease: Secondary | ICD-10-CM | POA: Diagnosis not present

## 2015-08-10 DIAGNOSIS — N186 End stage renal disease: Secondary | ICD-10-CM | POA: Diagnosis not present

## 2015-08-10 DIAGNOSIS — N2581 Secondary hyperparathyroidism of renal origin: Secondary | ICD-10-CM | POA: Diagnosis not present

## 2015-08-12 DIAGNOSIS — N2581 Secondary hyperparathyroidism of renal origin: Secondary | ICD-10-CM | POA: Diagnosis not present

## 2015-08-12 DIAGNOSIS — N186 End stage renal disease: Secondary | ICD-10-CM | POA: Diagnosis not present

## 2015-08-14 DIAGNOSIS — N2581 Secondary hyperparathyroidism of renal origin: Secondary | ICD-10-CM | POA: Diagnosis not present

## 2015-08-14 DIAGNOSIS — N186 End stage renal disease: Secondary | ICD-10-CM | POA: Diagnosis not present

## 2015-08-17 DIAGNOSIS — N186 End stage renal disease: Secondary | ICD-10-CM | POA: Diagnosis not present

## 2015-08-17 DIAGNOSIS — N2581 Secondary hyperparathyroidism of renal origin: Secondary | ICD-10-CM | POA: Diagnosis not present

## 2015-08-19 DIAGNOSIS — N186 End stage renal disease: Secondary | ICD-10-CM | POA: Diagnosis not present

## 2015-08-19 DIAGNOSIS — N2581 Secondary hyperparathyroidism of renal origin: Secondary | ICD-10-CM | POA: Diagnosis not present

## 2015-08-21 DIAGNOSIS — N186 End stage renal disease: Secondary | ICD-10-CM | POA: Diagnosis not present

## 2015-08-21 DIAGNOSIS — N2581 Secondary hyperparathyroidism of renal origin: Secondary | ICD-10-CM | POA: Diagnosis not present

## 2015-08-24 DIAGNOSIS — N186 End stage renal disease: Secondary | ICD-10-CM | POA: Diagnosis not present

## 2015-08-24 DIAGNOSIS — N2581 Secondary hyperparathyroidism of renal origin: Secondary | ICD-10-CM | POA: Diagnosis not present

## 2015-08-26 DIAGNOSIS — N186 End stage renal disease: Secondary | ICD-10-CM | POA: Diagnosis not present

## 2015-08-26 DIAGNOSIS — N2581 Secondary hyperparathyroidism of renal origin: Secondary | ICD-10-CM | POA: Diagnosis not present

## 2015-08-28 DIAGNOSIS — N186 End stage renal disease: Secondary | ICD-10-CM | POA: Diagnosis not present

## 2015-08-28 DIAGNOSIS — N2581 Secondary hyperparathyroidism of renal origin: Secondary | ICD-10-CM | POA: Diagnosis not present

## 2015-08-30 DIAGNOSIS — N186 End stage renal disease: Secondary | ICD-10-CM | POA: Diagnosis not present

## 2015-08-30 DIAGNOSIS — Q612 Polycystic kidney, adult type: Secondary | ICD-10-CM | POA: Diagnosis not present

## 2015-08-30 DIAGNOSIS — Z992 Dependence on renal dialysis: Secondary | ICD-10-CM | POA: Diagnosis not present

## 2015-08-31 DIAGNOSIS — N186 End stage renal disease: Secondary | ICD-10-CM | POA: Diagnosis not present

## 2015-08-31 DIAGNOSIS — N2581 Secondary hyperparathyroidism of renal origin: Secondary | ICD-10-CM | POA: Diagnosis not present

## 2015-09-02 DIAGNOSIS — N186 End stage renal disease: Secondary | ICD-10-CM | POA: Diagnosis not present

## 2015-09-02 DIAGNOSIS — N2581 Secondary hyperparathyroidism of renal origin: Secondary | ICD-10-CM | POA: Diagnosis not present

## 2015-09-03 DIAGNOSIS — Z0000 Encounter for general adult medical examination without abnormal findings: Secondary | ICD-10-CM | POA: Diagnosis not present

## 2015-09-03 DIAGNOSIS — E782 Mixed hyperlipidemia: Secondary | ICD-10-CM | POA: Diagnosis not present

## 2015-09-03 DIAGNOSIS — Z1389 Encounter for screening for other disorder: Secondary | ICD-10-CM | POA: Diagnosis not present

## 2015-09-03 DIAGNOSIS — I120 Hypertensive chronic kidney disease with stage 5 chronic kidney disease or end stage renal disease: Secondary | ICD-10-CM | POA: Diagnosis not present

## 2015-09-03 DIAGNOSIS — K4090 Unilateral inguinal hernia, without obstruction or gangrene, not specified as recurrent: Secondary | ICD-10-CM | POA: Diagnosis not present

## 2015-09-03 DIAGNOSIS — M109 Gout, unspecified: Secondary | ICD-10-CM | POA: Diagnosis not present

## 2015-09-03 DIAGNOSIS — N186 End stage renal disease: Secondary | ICD-10-CM | POA: Diagnosis not present

## 2015-09-03 DIAGNOSIS — G4733 Obstructive sleep apnea (adult) (pediatric): Secondary | ICD-10-CM | POA: Diagnosis not present

## 2015-09-03 DIAGNOSIS — N529 Male erectile dysfunction, unspecified: Secondary | ICD-10-CM | POA: Diagnosis not present

## 2015-09-03 DIAGNOSIS — Z1211 Encounter for screening for malignant neoplasm of colon: Secondary | ICD-10-CM | POA: Diagnosis not present

## 2015-09-04 DIAGNOSIS — N2581 Secondary hyperparathyroidism of renal origin: Secondary | ICD-10-CM | POA: Diagnosis not present

## 2015-09-04 DIAGNOSIS — N186 End stage renal disease: Secondary | ICD-10-CM | POA: Diagnosis not present

## 2015-09-07 DIAGNOSIS — N186 End stage renal disease: Secondary | ICD-10-CM | POA: Diagnosis not present

## 2015-09-07 DIAGNOSIS — N2581 Secondary hyperparathyroidism of renal origin: Secondary | ICD-10-CM | POA: Diagnosis not present

## 2015-09-08 DIAGNOSIS — Z0000 Encounter for general adult medical examination without abnormal findings: Secondary | ICD-10-CM | POA: Diagnosis not present

## 2015-09-08 DIAGNOSIS — Z125 Encounter for screening for malignant neoplasm of prostate: Secondary | ICD-10-CM | POA: Diagnosis not present

## 2015-09-08 DIAGNOSIS — M109 Gout, unspecified: Secondary | ICD-10-CM | POA: Diagnosis not present

## 2015-09-08 DIAGNOSIS — I120 Hypertensive chronic kidney disease with stage 5 chronic kidney disease or end stage renal disease: Secondary | ICD-10-CM | POA: Diagnosis not present

## 2015-09-09 DIAGNOSIS — N2581 Secondary hyperparathyroidism of renal origin: Secondary | ICD-10-CM | POA: Diagnosis not present

## 2015-09-09 DIAGNOSIS — N186 End stage renal disease: Secondary | ICD-10-CM | POA: Diagnosis not present

## 2015-09-11 DIAGNOSIS — N2581 Secondary hyperparathyroidism of renal origin: Secondary | ICD-10-CM | POA: Diagnosis not present

## 2015-09-11 DIAGNOSIS — N186 End stage renal disease: Secondary | ICD-10-CM | POA: Diagnosis not present

## 2015-09-11 DIAGNOSIS — Z1211 Encounter for screening for malignant neoplasm of colon: Secondary | ICD-10-CM | POA: Diagnosis not present

## 2015-09-14 DIAGNOSIS — N186 End stage renal disease: Secondary | ICD-10-CM | POA: Diagnosis not present

## 2015-09-14 DIAGNOSIS — N2581 Secondary hyperparathyroidism of renal origin: Secondary | ICD-10-CM | POA: Diagnosis not present

## 2015-09-15 ENCOUNTER — Ambulatory Visit (INDEPENDENT_AMBULATORY_CARE_PROVIDER_SITE_OTHER): Payer: Medicare Other | Admitting: Neurology

## 2015-09-15 ENCOUNTER — Telehealth: Payer: Self-pay | Admitting: Neurology

## 2015-09-15 ENCOUNTER — Encounter: Payer: Self-pay | Admitting: Neurology

## 2015-09-15 VITALS — BP 90/60 | HR 48 | Resp 20 | Ht 68.0 in | Wt 197.0 lb

## 2015-09-15 DIAGNOSIS — G4733 Obstructive sleep apnea (adult) (pediatric): Secondary | ICD-10-CM

## 2015-09-15 DIAGNOSIS — Z9989 Dependence on other enabling machines and devices: Principal | ICD-10-CM

## 2015-09-15 DIAGNOSIS — G4730 Sleep apnea, unspecified: Secondary | ICD-10-CM | POA: Diagnosis not present

## 2015-09-15 DIAGNOSIS — R001 Bradycardia, unspecified: Secondary | ICD-10-CM | POA: Diagnosis not present

## 2015-09-15 DIAGNOSIS — N186 End stage renal disease: Secondary | ICD-10-CM

## 2015-09-15 DIAGNOSIS — T50905A Adverse effect of unspecified drugs, medicaments and biological substances, initial encounter: Secondary | ICD-10-CM

## 2015-09-15 DIAGNOSIS — Z992 Dependence on renal dialysis: Secondary | ICD-10-CM | POA: Diagnosis not present

## 2015-09-15 DIAGNOSIS — G4710 Hypersomnia, unspecified: Secondary | ICD-10-CM | POA: Insufficient documentation

## 2015-09-15 NOTE — Telephone Encounter (Signed)
[facility] [id number] [place] [id number] * 2058-03-11 GOT ORDER FOR PT'S CPAP SUPPLIES, DOES NOT HAVE MEDICARE CONTRACT Y

## 2015-09-15 NOTE — Patient Instructions (Signed)

## 2015-09-15 NOTE — Telephone Encounter (Signed)
Spoke to [place] at [facility]. Pt was using them, but they do not have a medicare contract so pt must switch DMEs. Will send to [facility].
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
## 2015-09-16 DIAGNOSIS — N186 End stage renal disease: Secondary | ICD-10-CM | POA: Diagnosis not present

## 2015-09-16 DIAGNOSIS — N2581 Secondary hyperparathyroidism of renal origin: Secondary | ICD-10-CM | POA: Diagnosis not present

## 2015-09-18 DIAGNOSIS — N2581 Secondary hyperparathyroidism of renal origin: Secondary | ICD-10-CM | POA: Diagnosis not present

## 2015-09-18 DIAGNOSIS — N186 End stage renal disease: Secondary | ICD-10-CM | POA: Diagnosis not present

## 2015-09-21 DIAGNOSIS — N186 End stage renal disease: Secondary | ICD-10-CM | POA: Diagnosis not present

## 2015-09-21 DIAGNOSIS — N2581 Secondary hyperparathyroidism of renal origin: Secondary | ICD-10-CM | POA: Diagnosis not present

## 2015-09-23 DIAGNOSIS — N2581 Secondary hyperparathyroidism of renal origin: Secondary | ICD-10-CM | POA: Diagnosis not present

## 2015-09-23 DIAGNOSIS — N186 End stage renal disease: Secondary | ICD-10-CM | POA: Diagnosis not present

## 2015-09-24 ENCOUNTER — Ambulatory Visit (HOSPITAL_COMMUNITY)
Admission: RE | Admit: 2015-09-24 | Discharge: 2015-09-24 | Disposition: A | Discharge status: Home / Self Care | Payer: Medicare Other | Source: Ambulatory Visit | Attending: Surgery | Admitting: Surgery

## 2015-09-24 ENCOUNTER — Other Ambulatory Visit (HOSPITAL_COMMUNITY): Payer: Self-pay | Admitting: Surgery

## 2015-09-24 DIAGNOSIS — K4090 Unilateral inguinal hernia, without obstruction or gangrene, not specified as recurrent: Secondary | ICD-10-CM | POA: Insufficient documentation

## 2015-09-24 DIAGNOSIS — I493 Ventricular premature depolarization: Secondary | ICD-10-CM | POA: Diagnosis not present

## 2015-09-24 DIAGNOSIS — Z992 Dependence on renal dialysis: Secondary | ICD-10-CM | POA: Diagnosis not present

## 2015-09-24 DIAGNOSIS — R9431 Abnormal electrocardiogram [ECG] [EKG]: Secondary | ICD-10-CM | POA: Diagnosis not present

## 2015-09-24 DIAGNOSIS — I440 Atrioventricular block, first degree: Secondary | ICD-10-CM | POA: Diagnosis not present

## 2015-09-24 DIAGNOSIS — N186 End stage renal disease: Secondary | ICD-10-CM | POA: Diagnosis not present

## 2015-09-25 DIAGNOSIS — N186 End stage renal disease: Secondary | ICD-10-CM | POA: Diagnosis not present

## 2015-09-25 DIAGNOSIS — N2581 Secondary hyperparathyroidism of renal origin: Secondary | ICD-10-CM | POA: Diagnosis not present

## 2015-09-28 DIAGNOSIS — N186 End stage renal disease: Secondary | ICD-10-CM | POA: Diagnosis not present

## 2015-09-28 DIAGNOSIS — N2581 Secondary hyperparathyroidism of renal origin: Secondary | ICD-10-CM | POA: Diagnosis not present

## 2015-09-30 DIAGNOSIS — Q612 Polycystic kidney, adult type: Secondary | ICD-10-CM | POA: Diagnosis not present

## 2015-09-30 DIAGNOSIS — N2581 Secondary hyperparathyroidism of renal origin: Secondary | ICD-10-CM | POA: Diagnosis not present

## 2015-09-30 DIAGNOSIS — Z992 Dependence on renal dialysis: Secondary | ICD-10-CM | POA: Diagnosis not present

## 2015-09-30 DIAGNOSIS — N186 End stage renal disease: Secondary | ICD-10-CM | POA: Diagnosis not present

## 2015-10-02 DIAGNOSIS — N186 End stage renal disease: Secondary | ICD-10-CM | POA: Diagnosis not present

## 2015-10-02 DIAGNOSIS — N2581 Secondary hyperparathyroidism of renal origin: Secondary | ICD-10-CM | POA: Diagnosis not present

## 2015-10-05 DIAGNOSIS — N2581 Secondary hyperparathyroidism of renal origin: Secondary | ICD-10-CM | POA: Diagnosis not present

## 2015-10-05 DIAGNOSIS — N186 End stage renal disease: Secondary | ICD-10-CM | POA: Diagnosis not present

## 2015-10-07 DIAGNOSIS — N2581 Secondary hyperparathyroidism of renal origin: Secondary | ICD-10-CM | POA: Diagnosis not present

## 2015-10-07 DIAGNOSIS — N186 End stage renal disease: Secondary | ICD-10-CM | POA: Diagnosis not present

## 2015-10-09 DIAGNOSIS — D696 Thrombocytopenia, unspecified: Secondary | ICD-10-CM | POA: Diagnosis not present

## 2015-10-09 DIAGNOSIS — N186 End stage renal disease: Secondary | ICD-10-CM | POA: Diagnosis not present

## 2015-10-09 DIAGNOSIS — N2581 Secondary hyperparathyroidism of renal origin: Secondary | ICD-10-CM | POA: Diagnosis not present

## 2015-10-12 DIAGNOSIS — N186 End stage renal disease: Secondary | ICD-10-CM | POA: Diagnosis not present

## 2015-10-12 DIAGNOSIS — N2581 Secondary hyperparathyroidism of renal origin: Secondary | ICD-10-CM | POA: Diagnosis not present

## 2015-10-13 DIAGNOSIS — Z992 Dependence on renal dialysis: Secondary | ICD-10-CM | POA: Diagnosis not present

## 2015-10-13 DIAGNOSIS — T82868D Thrombosis of vascular prosthetic devices, implants and grafts, subsequent encounter: Secondary | ICD-10-CM | POA: Diagnosis not present

## 2015-10-13 DIAGNOSIS — I871 Compression of vein: Secondary | ICD-10-CM | POA: Diagnosis not present

## 2015-10-13 DIAGNOSIS — N186 End stage renal disease: Secondary | ICD-10-CM | POA: Diagnosis not present

## 2015-10-14 DIAGNOSIS — N2581 Secondary hyperparathyroidism of renal origin: Secondary | ICD-10-CM | POA: Diagnosis not present

## 2015-10-14 DIAGNOSIS — N186 End stage renal disease: Secondary | ICD-10-CM | POA: Diagnosis not present

## 2015-10-16 DIAGNOSIS — N186 End stage renal disease: Secondary | ICD-10-CM | POA: Diagnosis not present

## 2015-10-16 DIAGNOSIS — N2581 Secondary hyperparathyroidism of renal origin: Secondary | ICD-10-CM | POA: Diagnosis not present

## 2015-10-19 DIAGNOSIS — N2581 Secondary hyperparathyroidism of renal origin: Secondary | ICD-10-CM | POA: Diagnosis not present

## 2015-10-19 DIAGNOSIS — N186 End stage renal disease: Secondary | ICD-10-CM | POA: Diagnosis not present

## 2015-10-21 DIAGNOSIS — N186 End stage renal disease: Secondary | ICD-10-CM | POA: Diagnosis not present

## 2015-10-21 DIAGNOSIS — N2581 Secondary hyperparathyroidism of renal origin: Secondary | ICD-10-CM | POA: Diagnosis not present

## 2015-10-23 DIAGNOSIS — N186 End stage renal disease: Secondary | ICD-10-CM | POA: Diagnosis not present

## 2015-10-23 DIAGNOSIS — N2581 Secondary hyperparathyroidism of renal origin: Secondary | ICD-10-CM | POA: Diagnosis not present

## 2015-10-26 DIAGNOSIS — N2581 Secondary hyperparathyroidism of renal origin: Secondary | ICD-10-CM | POA: Diagnosis not present

## 2015-10-26 DIAGNOSIS — N186 End stage renal disease: Secondary | ICD-10-CM | POA: Diagnosis not present

## 2015-10-28 DIAGNOSIS — N2581 Secondary hyperparathyroidism of renal origin: Secondary | ICD-10-CM | POA: Diagnosis not present

## 2015-10-28 DIAGNOSIS — N186 End stage renal disease: Secondary | ICD-10-CM | POA: Diagnosis not present

## 2015-10-30 DIAGNOSIS — Q612 Polycystic kidney, adult type: Secondary | ICD-10-CM | POA: Diagnosis not present

## 2015-10-30 DIAGNOSIS — Z992 Dependence on renal dialysis: Secondary | ICD-10-CM | POA: Diagnosis not present

## 2015-10-30 DIAGNOSIS — N186 End stage renal disease: Secondary | ICD-10-CM | POA: Diagnosis not present

## 2015-10-30 DIAGNOSIS — N2581 Secondary hyperparathyroidism of renal origin: Secondary | ICD-10-CM | POA: Diagnosis not present

## 2015-11-02 DIAGNOSIS — N186 End stage renal disease: Secondary | ICD-10-CM | POA: Diagnosis not present

## 2015-11-02 DIAGNOSIS — N2581 Secondary hyperparathyroidism of renal origin: Secondary | ICD-10-CM | POA: Diagnosis not present

## 2015-11-04 ENCOUNTER — Encounter (HOSPITAL_COMMUNITY): Payer: Self-pay | Admitting: *Deleted

## 2015-11-04 DIAGNOSIS — N2581 Secondary hyperparathyroidism of renal origin: Secondary | ICD-10-CM | POA: Diagnosis not present

## 2015-11-04 DIAGNOSIS — N186 End stage renal disease: Secondary | ICD-10-CM | POA: Diagnosis not present

## 2015-11-05 ENCOUNTER — Ambulatory Visit: Payer: Self-pay | Admitting: Surgery

## 2015-11-05 NOTE — H&P (Signed)
Michael [name] Location: [facility] Patient #: [id number] DOB: 31-Dec-1955 Married / Language: English / Race: Black or African American Male   History of Present Illness  The patient is a 60 year old male who presents with an inguinal hernia. He has been watching a right inguinal hernia grow larger. It has been increasing in size. He has been on hemodialysis for polycystic kidney disease. He had exploratory laparotomy by Serita Grammes for a small bowel obstruction. He is followed by Dr. Alyson Ingles. I explained right inguinal hernia open. Would prefer to do at [facility] on the day after dialysis. He is aware of the risks and benefits and wants to proceed.   Other Problems  Chronic Renal Failure Syndrome Umbilical Hernia Repair  Past Surgical History  Dialysis Shunt / Fistula Ventral / Umbilical Hernia Surgery multiple  Diagnostic Studies History  Colonoscopy within last year  Allergies  No Known Drug Allergies05/25/2017  Medication History  Atorvastatin Calcium (40MG  Tablet, Oral) Active. Metoprolol Succinate ER (100MG  Tablet ER 24HR, Oral) Active. Sensipar (30MG  Tablet, Oral) Active. Tums 500 (1250MG  Tablet Chewable, Oral) Active. Aspirin (81MG  Tablet DR, Oral) Active. Medications Reconciled  Social History  No alcohol use No caffeine use No drug use Tobacco use Never smoker.  Family History  Heart Disease Mother. Kidney Disease Brother, Daughter, Father.    Review of Systems  General Not Present- Appetite Loss, Chills, Fatigue, Fever, Night Sweats, Weight Gain and Weight Loss. Skin Not Present- Change in Wart/Mole, Dryness, Hives, Jaundice, New Lesions, Non-Healing Wounds, Rash and Ulcer. HEENT Present- Hearing Loss, Ringing in the Ears and Wears glasses/contact lenses. Not Present- Earache, Hoarseness, Nose Bleed, Oral Ulcers, Seasonal Allergies, Sinus Pain, Sore Throat, Visual Disturbances and Yellow Eyes. Respiratory Present- Chronic Cough  and Difficulty Breathing. Not Present- Bloody sputum, Snoring and Wheezing. Breast Not Present- Breast Mass, Breast Pain, Nipple Discharge and Skin Changes. Cardiovascular Present- Shortness of Breath and Swelling of Extremities. Not Present- Chest Pain, Difficulty Breathing Lying Down, Leg Cramps, Palpitations and Rapid Heart Rate. Gastrointestinal Not Present- Abdominal Pain, Bloating, Bloody Stool, Change in Bowel Habits, Chronic diarrhea, Constipation, Difficulty Swallowing, Excessive gas, Gets full quickly at meals, Hemorrhoids, Indigestion, Nausea, Rectal Pain and Vomiting. Male Genitourinary Not Present- Blood in Urine, Change in Urinary Stream, Frequency, Impotence, Nocturia, Painful Urination, Urgency and Urine Leakage.  Vitals   Weight: 198.3 lb Height: 67in Body Surface Area: 2.01 m Body Mass Index: 31.06 kg/m  Temp.: 98.82F(Oral)  Pulse: 60 (Regular)  BP: 122/82 (Sitting, Left Arm, Standard)  Physical Exam (Larie Mathes B. Hassell Done MD; 09/24/2015 11:40 AM) General Note: HEENT unremarkable Neck supple Chest clear Heart SR with premature beats-may rule out AF ABdomen-midline incision has healed. Gu- large right scrotal hernia that is not reducible because of its size Ext FROM     Assessment & Plan  RIGHT INGUINAL HERNIA (K40.90) Impression: Schedule open right inguinal hernia repair as an outpatient at [facility] on the day after dialysis. Will check preop EKG ESRD (END STAGE RENAL DISEASE) ON DIALYSIS (N18.6)

## 2015-11-06 ENCOUNTER — Ambulatory Visit (HOSPITAL_COMMUNITY): Payer: Medicare Other | Admitting: Anesthesiology

## 2015-11-06 ENCOUNTER — Encounter (HOSPITAL_COMMUNITY): Payer: Self-pay

## 2015-11-06 ENCOUNTER — Encounter (HOSPITAL_COMMUNITY)
Admission: RE | Disposition: A | Discharge status: Home / Self Care | Payer: Self-pay | Source: Ambulatory Visit | Attending: Surgery

## 2015-11-06 ENCOUNTER — Ambulatory Visit (HOSPITAL_COMMUNITY)
Admission: RE | Admit: 2015-11-06 | Discharge: 2015-11-06 | Disposition: A | Discharge status: Home / Self Care | Payer: Medicare Other | Source: Ambulatory Visit | Attending: Surgery | Admitting: Surgery

## 2015-11-06 DIAGNOSIS — K4090 Unilateral inguinal hernia, without obstruction or gangrene, not specified as recurrent: Secondary | ICD-10-CM | POA: Diagnosis not present

## 2015-11-06 DIAGNOSIS — Q613 Polycystic kidney, unspecified: Secondary | ICD-10-CM | POA: Insufficient documentation

## 2015-11-06 DIAGNOSIS — Z7982 Long term (current) use of aspirin: Secondary | ICD-10-CM | POA: Insufficient documentation

## 2015-11-06 DIAGNOSIS — I1311 Hypertensive heart and chronic kidney disease without heart failure, with stage 5 chronic kidney disease, or end stage renal disease: Secondary | ICD-10-CM | POA: Insufficient documentation

## 2015-11-06 DIAGNOSIS — N186 End stage renal disease: Secondary | ICD-10-CM | POA: Insufficient documentation

## 2015-11-06 DIAGNOSIS — Z9889 Other specified postprocedural states: Secondary | ICD-10-CM

## 2015-11-06 DIAGNOSIS — I120 Hypertensive chronic kidney disease with stage 5 chronic kidney disease or end stage renal disease: Secondary | ICD-10-CM | POA: Diagnosis not present

## 2015-11-06 DIAGNOSIS — Z79899 Other long term (current) drug therapy: Secondary | ICD-10-CM | POA: Diagnosis not present

## 2015-11-06 DIAGNOSIS — Z992 Dependence on renal dialysis: Secondary | ICD-10-CM | POA: Diagnosis not present

## 2015-11-06 DIAGNOSIS — I428 Other cardiomyopathies: Secondary | ICD-10-CM | POA: Insufficient documentation

## 2015-11-06 DIAGNOSIS — Z8719 Personal history of other diseases of the digestive system: Secondary | ICD-10-CM

## 2015-11-06 DIAGNOSIS — G4730 Sleep apnea, unspecified: Secondary | ICD-10-CM | POA: Insufficient documentation

## 2015-11-06 HISTORY — PX: INGUINAL HERNIA REPAIR: SHX194

## 2015-11-06 LAB — CBC
HCT: 38.6 % — ABNORMAL LOW (ref 39.0–52.0)
Hemoglobin: 12.7 g/dL — ABNORMAL LOW (ref 13.0–17.0)
MCH: 29.1 pg (ref 26.0–34.0)
MCHC: 32.9 g/dL (ref 30.0–36.0)
MCV: 88.5 fL (ref 78.0–100.0)
Platelets: 108 10*3/uL — ABNORMAL LOW (ref 150–400)
RBC: 4.36 MIL/uL (ref 4.22–5.81)
RDW: 14.8 % (ref 11.5–15.5)
WBC: 3.2 10*3/uL — ABNORMAL LOW (ref 4.0–10.5)

## 2015-11-06 LAB — BASIC METABOLIC PANEL
Anion gap: 10 (ref 5–15)
BUN: 26 mg/dL — ABNORMAL HIGH (ref 6–20)
CO2: 31 mmol/L (ref 22–32)
Calcium: 9.6 mg/dL (ref 8.9–10.3)
Chloride: 95 mmol/L — ABNORMAL LOW (ref 101–111)
Creatinine, Ser: 7.05 mg/dL — ABNORMAL HIGH (ref 0.61–1.24)
GFR calc Af Amer: 9 mL/min — ABNORMAL LOW (ref >60)
GFR calc non Af Amer: 8 mL/min — ABNORMAL LOW (ref >60)
Glucose, Bld: 77 mg/dL (ref 65–99)
Potassium: 4.1 mmol/L (ref 3.5–5.1)
Sodium: 136 mmol/L (ref 135–145)

## 2015-11-06 SURGERY — REPAIR, HERNIA, INGUINAL, ADULT
Anesthesia: General | Site: Abdomen | Laterality: Right

## 2015-11-06 MED ORDER — PHENYLEPHRINE HCL 10 MG/ML IJ SOLN
INTRAMUSCULAR | Status: DC | PRN
  Administered 2015-11-06 (×3): 80 ug via INTRAVENOUS

## 2015-11-06 MED ORDER — OXYCODONE HCL 5 MG/5ML PO SOLN: 5.0000 mg | Freq: Once | ORAL | Status: DC | PRN

## 2015-11-06 MED ORDER — PROPOFOL 10 MG/ML IV BOLUS
INTRAVENOUS | Status: DC | PRN
  Administered 2015-11-06: 150 mg via INTRAVENOUS

## 2015-11-06 MED ORDER — CEFAZOLIN SODIUM-DEXTROSE 2-4 GM/100ML-% IV SOLN
2.0000 g | INTRAVENOUS | Status: AC
  Administered 2015-11-06: 2 g via INTRAVENOUS

## 2015-11-06 MED ORDER — SODIUM CHLORIDE 0.9 % IV SOLN: INTRAVENOUS | Status: DC

## 2015-11-06 MED ORDER — ROCURONIUM BROMIDE 100 MG/10ML IV SOLN
INTRAVENOUS | Status: AC
  Filled 2015-11-06: qty 1

## 2015-11-06 MED ORDER — FENTANYL CITRATE (PF) 250 MCG/5ML IJ SOLN
INTRAMUSCULAR | Status: AC
  Filled 2015-11-06: qty 5

## 2015-11-06 MED ORDER — SODIUM CHLORIDE 0.9 % IV SOLN
INTRAVENOUS | Status: DC | PRN
  Administered 2015-11-06: 07:00:00 via INTRAVENOUS

## 2015-11-06 MED ORDER — ACETAMINOPHEN 500 MG PO TABS
1000.0000 mg | ORAL_TABLET | ORAL | Status: AC
  Administered 2015-11-06: 1000 mg via ORAL
  Filled 2015-11-06: qty 2

## 2015-11-06 MED ORDER — ONDANSETRON HCL 4 MG/2ML IJ SOLN
INTRAMUSCULAR | Status: AC
  Filled 2015-11-06: qty 2

## 2015-11-06 MED ORDER — CHLORHEXIDINE GLUCONATE CLOTH 2 % EX PADS: 6.0000 | MEDICATED_PAD | Freq: Once | CUTANEOUS | Status: DC

## 2015-11-06 MED ORDER — OXYCODONE HCL 5 MG PO TABS: 5.0000 mg | ORAL_TABLET | ORAL | Status: DC | PRN

## 2015-11-06 MED ORDER — HEPARIN SODIUM (PORCINE) 5000 UNIT/ML IJ SOLN
5000.0000 [IU] | Freq: Once | INTRAMUSCULAR | Status: AC
  Administered 2015-11-06: 5000 [IU] via SUBCUTANEOUS
  Filled 2015-11-06: qty 1

## 2015-11-06 MED ORDER — SUCCINYLCHOLINE CHLORIDE 20 MG/ML IJ SOLN
INTRAMUSCULAR | Status: DC | PRN
  Administered 2015-11-06: 100 mg via INTRAVENOUS

## 2015-11-06 MED ORDER — ACETAMINOPHEN 650 MG RE SUPP
650.0000 mg | RECTAL | Status: DC | PRN
  Filled 2015-11-06: qty 1

## 2015-11-06 MED ORDER — SODIUM CHLORIDE 0.9% FLUSH: 3.0000 mL | INTRAVENOUS | Status: DC | PRN

## 2015-11-06 MED ORDER — OXYCODONE HCL 5 MG PO TABS: 5.0000 mg | ORAL_TABLET | Freq: Once | ORAL | Status: DC | PRN

## 2015-11-06 MED ORDER — NEOSTIGMINE METHYLSULFATE 10 MG/10ML IV SOLN
INTRAVENOUS | Status: DC | PRN
  Administered 2015-11-06: 4 mg via INTRAVENOUS

## 2015-11-06 MED ORDER — PHENYLEPHRINE 40 MCG/ML (10ML) SYRINGE FOR IV PUSH (FOR BLOOD PRESSURE SUPPORT)
PREFILLED_SYRINGE | INTRAVENOUS | Status: AC
  Filled 2015-11-06: qty 10

## 2015-11-06 MED ORDER — DEXAMETHASONE SODIUM PHOSPHATE 10 MG/ML IJ SOLN
INTRAMUSCULAR | Status: AC
  Filled 2015-11-06: qty 1

## 2015-11-06 MED ORDER — MIDAZOLAM HCL 2 MG/2ML IJ SOLN
INTRAMUSCULAR | Status: AC
  Filled 2015-11-06: qty 2

## 2015-11-06 MED ORDER — PHENYLEPHRINE HCL 10 MG/ML IJ SOLN
20.0000 mg | INTRAMUSCULAR | Status: DC | PRN
  Administered 2015-11-06: 5 ug/min via INTRAVENOUS

## 2015-11-06 MED ORDER — PHENYLEPHRINE HCL 10 MG/ML IJ SOLN
INTRAMUSCULAR | Status: AC
  Filled 2015-11-06: qty 1

## 2015-11-06 MED ORDER — PROMETHAZINE HCL 25 MG/ML IJ SOLN: 6.2500 mg | INTRAMUSCULAR | Status: DC | PRN

## 2015-11-06 MED ORDER — NEOSTIGMINE METHYLSULFATE 10 MG/10ML IV SOLN
INTRAVENOUS | Status: AC
  Filled 2015-11-06: qty 1

## 2015-11-06 MED ORDER — GLYCOPYRROLATE 0.2 MG/ML IJ SOLN
INTRAMUSCULAR | Status: DC | PRN
  Administered 2015-11-06: 0.6 mg via INTRAVENOUS

## 2015-11-06 MED ORDER — HYDROCODONE-ACETAMINOPHEN 5-325 MG PO TABS: 1.0000 | ORAL_TABLET | ORAL | Status: DC | PRN

## 2015-11-06 MED ORDER — ONDANSETRON HCL 4 MG/2ML IJ SOLN
INTRAMUSCULAR | Status: DC | PRN
  Administered 2015-11-06: 4 mg via INTRAVENOUS

## 2015-11-06 MED ORDER — GABAPENTIN 300 MG PO CAPS
300.0000 mg | ORAL_CAPSULE | ORAL | Status: AC
  Administered 2015-11-06: 300 mg via ORAL
  Filled 2015-11-06: qty 1

## 2015-11-06 MED ORDER — LIDOCAINE HCL (CARDIAC) 20 MG/ML IV SOLN
INTRAVENOUS | Status: AC
  Filled 2015-11-06: qty 5

## 2015-11-06 MED ORDER — FENTANYL CITRATE (PF) 250 MCG/5ML IJ SOLN
INTRAMUSCULAR | Status: DC | PRN
  Administered 2015-11-06 (×3): 25 ug via INTRAVENOUS

## 2015-11-06 MED ORDER — CEFAZOLIN SODIUM-DEXTROSE 2-4 GM/100ML-% IV SOLN
INTRAVENOUS | Status: AC
  Filled 2015-11-06: qty 100

## 2015-11-06 MED ORDER — CISATRACURIUM BESYLATE (PF) 10 MG/5ML IV SOLN
INTRAVENOUS | Status: DC | PRN
  Administered 2015-11-06 (×4): 2 mg via INTRAVENOUS
  Administered 2015-11-06: 4 mg via INTRAVENOUS

## 2015-11-06 MED ORDER — CELECOXIB 200 MG PO CAPS
400.0000 mg | ORAL_CAPSULE | ORAL | Status: AC
  Administered 2015-11-06: 400 mg via ORAL
  Filled 2015-11-06: qty 2

## 2015-11-06 MED ORDER — BUPIVACAINE-EPINEPHRINE 0.25% -1:200000 IJ SOLN
INTRAMUSCULAR | Status: AC
  Filled 2015-11-06: qty 1

## 2015-11-06 MED ORDER — SODIUM CHLORIDE 0.9 % IR SOLN
Status: DC | PRN
  Administered 2015-11-06: 1000 mL

## 2015-11-06 MED ORDER — CISATRACURIUM BESYLATE 20 MG/10ML IV SOLN
INTRAVENOUS | Status: AC
  Filled 2015-11-06: qty 20

## 2015-11-06 MED ORDER — PROPOFOL 10 MG/ML IV BOLUS
INTRAVENOUS | Status: AC
  Filled 2015-11-06: qty 20

## 2015-11-06 MED ORDER — SODIUM CHLORIDE 0.9 % IV SOLN: 250.0000 mL | INTRAVENOUS | Status: DC | PRN

## 2015-11-06 MED ORDER — BUPIVACAINE LIPOSOME 1.3 % IJ SUSP
20.0000 mL | Freq: Once | INTRAMUSCULAR | Status: AC
  Administered 2015-11-06: 20 mL
  Filled 2015-11-06: qty 20

## 2015-11-06 MED ORDER — GLYCOPYRROLATE 0.2 MG/ML IJ SOLN
INTRAMUSCULAR | Status: AC
  Filled 2015-11-06: qty 3

## 2015-11-06 MED ORDER — LIDOCAINE HCL (CARDIAC) 20 MG/ML IV SOLN
INTRAVENOUS | Status: DC | PRN
  Administered 2015-11-06: 60 mg via INTRATRACHEAL

## 2015-11-06 MED ORDER — HYDROMORPHONE HCL 1 MG/ML IJ SOLN: 0.2500 mg | INTRAMUSCULAR | Status: DC | PRN

## 2015-11-06 MED ORDER — DEXAMETHASONE SODIUM PHOSPHATE 10 MG/ML IJ SOLN
INTRAMUSCULAR | Status: DC | PRN
  Administered 2015-11-06: 10 mg via INTRAVENOUS

## 2015-11-06 MED ORDER — SODIUM CHLORIDE 0.9% FLUSH: 3.0000 mL | Freq: Two times a day (BID) | INTRAVENOUS | Status: DC

## 2015-11-06 MED ORDER — ACETAMINOPHEN 325 MG PO TABS: 650.0000 mg | ORAL_TABLET | ORAL | Status: DC | PRN

## 2015-11-06 SURGICAL SUPPLY — 33 items
BLADE SURG 15 STRL LF DISP TIS (BLADE) ×1 IMPLANT
BLADE SURG 15 STRL SS (BLADE) ×2
COVER SURGICAL LIGHT HANDLE (MISCELLANEOUS) ×3 IMPLANT
DECANTER SPIKE VIAL GLASS SM (MISCELLANEOUS) ×1 IMPLANT
DISSECTOR ROUND CHERRY 3/8 STR (MISCELLANEOUS) IMPLANT
DRAIN PENROSE 18X1/2 LTX STRL (DRAIN) ×2 IMPLANT
DRAPE LAPAROTOMY TRNSV 102X78 (DRAPE) ×2 IMPLANT
ELECT PENCIL ROCKER SW 15FT (MISCELLANEOUS) ×2 IMPLANT
ELECT REM PT RETURN 9FT ADLT (ELECTROSURGICAL) ×2
ELECTRODE REM PT RTRN 9FT ADLT (ELECTROSURGICAL) ×1 IMPLANT
GLOVE BIOGEL M 8.0 STRL (GLOVE) ×2 IMPLANT
GOWN STRL REUS W/TWL XL LVL3 (GOWN DISPOSABLE) ×4 IMPLANT
HANDLE STAPLE EGIA 4 XL (STAPLE) ×1 IMPLANT
KIT BASIN OR (CUSTOM PROCEDURE TRAY) ×2 IMPLANT
LIQUID BAND (GAUZE/BANDAGES/DRESSINGS) ×1 IMPLANT
MESH HERNIA 3X6 (Mesh General) ×1 IMPLANT
NEEDLE HYPO 22GX1.5 SAFETY (NEEDLE) ×2 IMPLANT
PACK BASIC VI WITH GOWN DISP (CUSTOM PROCEDURE TRAY) ×2 IMPLANT
RELOAD EGIA 60 TAN VASC (STAPLE) ×1 IMPLANT
SPONGE LAP 18X18 X RAY DECT (DISPOSABLE) ×1 IMPLANT
SPONGE LAP 4X18 X RAY DECT (DISPOSABLE) ×6 IMPLANT
STAPLER VISISTAT 35W (STAPLE) IMPLANT
SUT PROLENE 2 0 CT2 30 (SUTURE) ×6 IMPLANT
SUT SILK 2 0 SH (SUTURE) IMPLANT
SUT VIC AB 2-0 SH 27 (SUTURE) ×2
SUT VIC AB 2-0 SH 27X BRD (SUTURE) ×1 IMPLANT
SUT VIC AB 4-0 SH 18 (SUTURE) ×3 IMPLANT
SUT VICRYL 4-0 (SUTURE) ×2 IMPLANT
SYR 20CC LL (SYRINGE) ×2 IMPLANT
SYR BULB IRRIGATION 50ML (SYRINGE) ×2 IMPLANT
TOWEL OR 17X26 10 PK STRL BLUE (TOWEL DISPOSABLE) ×2 IMPLANT
TOWEL OR NON WOVEN STRL DISP B (DISPOSABLE) ×2 IMPLANT
YANKAUER SUCT BULB TIP 10FT TU (MISCELLANEOUS) ×2 IMPLANT

## 2015-11-06 NOTE — Anesthesia Postprocedure Evaluation (Signed)
Anesthesia Post Note  Patient: Michael [name]  Procedure(s) Performed: Procedure(s) (LRB): OPEN HERNIA REPAIR  RIGHT INGUINAL ADULT (Right)  Patient location during evaluation: PACU Anesthesia Type: General Level of consciousness: awake and alert Pain management: pain level controlled Vital Signs Assessment: post-procedure vital signs reviewed and stable Respiratory status: spontaneous breathing, nonlabored ventilation, respiratory function stable and patient connected to nasal cannula oxygen Cardiovascular status: blood pressure returned to baseline and stable Postop Assessment: no signs of nausea or vomiting Anesthetic complications: no    Last Vitals:  Filed Vitals:   11/06/15 1020 11/06/15 1030  BP: 121/62 120/77  Pulse: 84 84  Temp: 36.9 C   Resp: 23 22    Last Pain: There were no vitals filed for this visit.               Zenaida Deed

## 2015-11-06 NOTE — Anesthesia Preprocedure Evaluation (Addendum)
Anesthesia Evaluation  Patient identified by [name], date of birth, ID band Patient awake    Reviewed: Allergy & Precautions, H&P , NPO status , Patient's Chart, lab work & pertinent test results  History of Anesthesia Complications Negative for: history of anesthetic complications  Airway Mallampati: III  TM Distance: >3 FB Neck ROM: full    Dental  (+) Poor Dentition   Pulmonary sleep apnea ,    Pulmonary exam normal breath sounds clear to auscultation       Cardiovascular hypertension, Pt. on medications  Rhythm:Irregular Rate:Normal  Non ischemic CHF with EF 25-30%   Neuro/Psych negative neurological ROS     GI/Hepatic negative GI ROS, Neg liver ROS,   Endo/Other  negative endocrine ROS  Renal/GU ESRFRenal disease     Musculoskeletal   Abdominal   Peds  Hematology negative hematology ROS (+)   Anesthesia Other Findings Denies chest pain, SOB, does not feel fluid overloaded  Lungs are clear, no significant lower extremity swelling noted, denies nausea or vomiting, day of surgery labs reviewed   Reproductive/Obstetrics negative OB ROS                           Anesthesia Physical Anesthesia Plan  ASA: III  Anesthesia Plan: General   Post-op Pain Management:    Induction: Intravenous  Airway Management Planned: Oral ETT  Additional Equipment:   Intra-op Plan:   Post-operative Plan: Extubation in OR  Informed Consent: I have reviewed the patients History and Physical, chart, labs and discussed the procedure including the risks, benefits and alternatives for the proposed anesthesia with the patient or authorized representative who has indicated his/her understanding and acceptance.   Dental Advisory Given  Plan Discussed with: Anesthesiologist, CRNA and Surgeon  Anesthesia Plan Comments:         Anesthesia Quick Evaluation

## 2015-11-06 NOTE — Discharge Instructions (Signed)
You may shower in the morning Wear "jockey " type underwear for added scrotal support  Inguinal Hernia, Adult Muscles help keep everything in the body in its proper place. But if a weak spot in the muscles develops, something can poke through. That is called a hernia. When this happens in the lower part of the belly (abdomen), it is called an inguinal hernia. (It takes its name from a part of the body in this region called the inguinal canal.) A weak spot in the wall of muscles lets some fat or part of the small intestine bulge through. An inguinal hernia can develop at any age. Men get them more often than women. CAUSES  In adults, an inguinal hernia develops over time.  It can be triggered by:  Suddenly straining the muscles of the lower abdomen.  Lifting heavy objects.  Straining to have a bowel movement. Difficult bowel movements (constipation) can lead to this.  Constant coughing. This may be caused by smoking or lung disease.  Being overweight.  Being pregnant.  Working at a job that requires long periods of standing or heavy lifting.  Having had an inguinal hernia before. One type can be an emergency situation. It is called a strangulated inguinal hernia. It develops if part of the small intestine slips through the weak spot and cannot get back into the abdomen. The blood supply can be cut off. If that happens, part of the intestine may die. This situation requires emergency surgery. SYMPTOMS  Often, a small inguinal hernia has no symptoms. It is found when a healthcare provider does a physical exam. Larger hernias usually have symptoms.   In adults, symptoms may include:  A lump in the groin. This is easier to see when the person is standing. It might disappear when lying down.  In men, a lump in the scrotum.  Pain or burning in the groin. This occurs especially when lifting, straining or coughing.  A dull ache or feeling of pressure in the groin.  Signs of a  strangulated hernia can include:  A bulge in the groin that becomes very painful and tender to the touch.  A bulge that turns red or purple.  Fever, nausea and vomiting.  Inability to have a bowel movement or to pass gas. DIAGNOSIS  To decide if you have an inguinal hernia, a healthcare provider will probably do a physical examination.  This will include asking questions about any symptoms you have noticed.  The healthcare provider might feel the groin area and ask you to cough. If an inguinal hernia is felt, the healthcare provider may try to slide it back into the abdomen.  Usually no other tests are needed. TREATMENT  Treatments can vary. The size of the hernia makes a difference. Options include:  Watchful waiting. This is often suggested if the hernia is small and you have had no symptoms.  No medical procedure will be done unless symptoms develop.  You will need to watch closely for symptoms. If any occur, contact your healthcare provider right away.  Surgery. This is used if the hernia is larger or you have symptoms.  Open surgery. This is usually an outpatient procedure (you will not stay overnight in a hospital). An cut (incision) is made through the skin in the groin. The hernia is put back inside the abdomen. The weak area in the muscles is then repaired by herniorrhaphy or hernioplasty. Herniorrhaphy: in this type of surgery, the weak muscles are sewn back together. Hernioplasty: a  patch or mesh is used to close the weak area in the abdominal wall.  Laparoscopy. In this procedure, a surgeon makes small incisions. A thin tube with a tiny video camera (called a laparoscope) is put into the abdomen. The surgeon repairs the hernia with mesh by looking with the video camera and using two long instruments. HOME CARE INSTRUCTIONS   After surgery to repair an inguinal hernia:  You will need to take pain medicine prescribed by your healthcare provider. Follow all directions  carefully.  You will need to take care of the wound from the incision.  Your activity will be restricted for awhile. This will probably include no heavy lifting for several weeks. You also should not do anything too active for a few weeks. When you can return to work will depend on the type of job that you have.  During "watchful waiting" periods, you should:  Maintain a healthy weight.  Eat a diet high in fiber (fruits, vegetables and whole grains).  Drink plenty of fluids to avoid constipation. This means drinking enough water and other liquids to keep your urine clear or pale yellow.  Do not lift heavy objects.  Do not stand for long periods of time.  Quit smoking. This should keep you from developing a frequent cough. SEEK MEDICAL CARE IF:   A bulge develops in your groin area.  You feel pain, a burning sensation or pressure in the groin. This might be worse if you are lifting or straining.  You develop a fever of more than 100.5 F (38.1 C). SEEK IMMEDIATE MEDICAL CARE IF:   Pain in the groin increases suddenly.  A bulge in the groin gets bigger suddenly and does not go down.  For men, there is sudden pain in the scrotum. Or, the size of the scrotum increases.  A bulge in the groin area becomes red or purple and is painful to touch.  You have nausea or vomiting that does not go away.  You feel your heart beating much faster than normal.  You cannot have a bowel movement or pass gas.  You develop a fever of more than 102.0 F (38.9 C).   This information is not intended to replace advice given to you by your health care provider. Make sure you discuss any questions you have with your health care provider.   Document Released: 09/04/2008 Document Revised: 07/11/2011 Document Reviewed: 10/20/2014 Elsevier Interactive Patient Education 2016 [place] Anesthesia, Adult, Care After Refer to this sheet in the next few weeks. These instructions  provide you with information on caring for yourself after your procedure. Your health care provider may also give you more specific instructions. Your treatment has been planned according to current medical practices, but problems sometimes occur. Call your health care provider if you have any problems or questions after your procedure. WHAT TO EXPECT AFTER THE PROCEDURE After the procedure, it is typical to experience:  Sleepiness.  Nausea and vomiting. HOME CARE INSTRUCTIONS  For the first 24 hours after general anesthesia:  Have a responsible person with you.  Do not drive a car. If you are alone, do not take public transportation.  Do not drink alcohol.  Do not take medicine that has not been prescribed by your health care provider.  Do not sign important papers or make important decisions.  You may resume a normal diet and activities as directed by your health care provider.  If you have questions or problems that  seem related to general anesthesia, call the hospital and ask for the anesthetist or anesthesiologist on call. SEEK MEDICAL CARE IF:  You have nausea and vomiting that continue the day after anesthesia.  You develop a rash. SEEK IMMEDIATE MEDICAL CARE IF:   You have difficulty breathing.  You have chest pain.  You have any allergic problems.   This information is not intended to replace advice given to you by your health care provider. Make sure you discuss any questions you have with your health care provider.   Document Released: 07/25/2000 Document Revised: 05/09/2014 Document Reviewed: 08/17/2011 Elsevier Interactive Patient Education [employer].

## 2015-11-06 NOTE — Op Note (Signed)
Surgeon: Kaylyn Lim, MD, FACS  Asst:  none  Anes:  general  Procedure: Open right inguinal hernia repair with mesh  Diagnosis: Giant scrotal indirect inguinal hernia  Complications: none  EBL:   minimal cc  Drains: none  Description of Procedure:  The patient was taken to OR 6 at [facility].  After anesthesia was administered and the patient was prepped a timeout was performed.  The scrotum, penis and lower abdomen were prepped with Technicare and draped sterilely. A small oblique incision was made and taken down to the external oblique.  The large hernia was bulging out through the external ring. I opened the fascia and mobilize the cord. The proximal cord was about 4 cm in diameter. I was very careful in doing a dissection removing this giant sac from his cord since he is on hemodialysis. Many 4-0 Vicryl suture ligatures and figure-of-eight's were used to control small bleeders. Sac was completely mobilized to the internal ring. A Covidien Endo GIA 6 cm using a brown load was used to transect the proximal sac after a looked inside and made sure everything was reduced. When this was performed excess sac was discarded. The floor was reinforced with a running 2-0 Prolene from medial to lateral to close the internal ring to make this more snug. The floor was further reinforced my cut a piece of mesh to fit suturing along the inguinal ligament and medially to the internal oblique and around the cord structures and sutured to itself. These structures the worse time tucked beneath the external oblique which was enclosed lateral to medial with 2-0 Vicryl. The area was injected with ex Brill full-strength. Wound was irrigated several times during the case. All areas that were felt to be using were controlled either with 4-0 Vicryl with a slight cautery. Following 4-0 Vicryl closure subcuticularly the skin was Approximated further with liquid band. Patient tolerated procedure well was taken recovery room in  satisfactory condition  The patient tolerated the procedure well and was taken to the PACU in stable condition.     Matt B. Hassell Done, [place], [facility], [place]

## 2015-11-06 NOTE — Interval H&P Note (Signed)
History and Physical Interval Note:  11/06/2015 7:31 AM  Michael [name]  has presented today for surgery, with the diagnosis of Large right inguinal hernia   The various methods of treatment have been discussed with the patient and family. After consideration of risks, benefits and other options for treatment, the patient has consented to  Procedure(s): OPEN HERNIA REPAIR  RIGHT INGUINAL ADULT (Right) as a surgical intervention .  The patient's history has been reviewed, patient examined, no change in status, stable for surgery.  I have reviewed the patient's chart and labs.  Questions were answered to the patient's satisfaction.     Alanna Storti B

## 2015-11-06 NOTE — Anesthesia Procedure Notes (Signed)
Procedure Name: Intubation Date/Time: 11/06/2015 7:44 AM Performed by: Dione Booze Pre-anesthesia Checklist: Emergency Drugs available, Suction available, Patient being monitored and Patient identified Patient Re-evaluated:Patient Re-evaluated prior to inductionOxygen Delivery Method: Circle system utilized Preoxygenation: Pre-oxygenation with 100% oxygen Intubation Type: IV induction Laryngoscope Size: Mac and 4 Grade View: Grade I Tube type: Oral Tube size: 7.5 mm Number of attempts: 1 Airway Equipment and Method: Stylet Placement Confirmation: ETT inserted through vocal cords under direct vision,  positive ETCO2 and breath sounds checked- equal and bilateral Secured at: 22 cm Tube secured with: Tape Dental Injury: Teeth and Oropharynx as per pre-operative assessment

## 2015-11-06 NOTE — Transfer of Care (Signed)
Immediate Anesthesia Transfer of Care Note  Patient: Michael [name]  Procedure(s) Performed: Procedure(s): OPEN HERNIA REPAIR  RIGHT INGUINAL ADULT (Right)  Patient Location: PACU  Anesthesia Type:General  Level of Consciousness: awake, alert  and patient cooperative  Airway & Oxygen Therapy: Patient Spontanous Breathing and Patient connected to face mask oxygen  Post-op Assessment: Report given to RN and Post -op Vital signs reviewed and stable  Post vital signs: Reviewed and stable  Last Vitals:  Filed Vitals:   11/06/15 0550  BP: 127/74  Pulse: 72  Temp: 36.6 C  Resp: 16    Last Pain: There were no vitals filed for this visit.    Patients Stated Pain Goal: 4 ([id number])  Complications: No apparent anesthesia complications

## 2015-11-09 DIAGNOSIS — N186 End stage renal disease: Secondary | ICD-10-CM | POA: Diagnosis not present

## 2015-11-09 DIAGNOSIS — N2581 Secondary hyperparathyroidism of renal origin: Secondary | ICD-10-CM | POA: Diagnosis not present

## 2015-11-11 DIAGNOSIS — N186 End stage renal disease: Secondary | ICD-10-CM | POA: Diagnosis not present

## 2015-11-11 DIAGNOSIS — N2581 Secondary hyperparathyroidism of renal origin: Secondary | ICD-10-CM | POA: Diagnosis not present

## 2015-11-13 DIAGNOSIS — N186 End stage renal disease: Secondary | ICD-10-CM | POA: Diagnosis not present

## 2015-11-13 DIAGNOSIS — N2581 Secondary hyperparathyroidism of renal origin: Secondary | ICD-10-CM | POA: Diagnosis not present

## 2015-11-16 DIAGNOSIS — N2581 Secondary hyperparathyroidism of renal origin: Secondary | ICD-10-CM | POA: Diagnosis not present

## 2015-11-16 DIAGNOSIS — N186 End stage renal disease: Secondary | ICD-10-CM | POA: Diagnosis not present

## 2015-11-18 DIAGNOSIS — N186 End stage renal disease: Secondary | ICD-10-CM | POA: Diagnosis not present

## 2015-11-18 DIAGNOSIS — N2581 Secondary hyperparathyroidism of renal origin: Secondary | ICD-10-CM | POA: Diagnosis not present

## 2015-11-20 DIAGNOSIS — N2581 Secondary hyperparathyroidism of renal origin: Secondary | ICD-10-CM | POA: Diagnosis not present

## 2015-11-20 DIAGNOSIS — N186 End stage renal disease: Secondary | ICD-10-CM | POA: Diagnosis not present

## 2015-11-23 DIAGNOSIS — N186 End stage renal disease: Secondary | ICD-10-CM | POA: Diagnosis not present

## 2015-11-23 DIAGNOSIS — N2581 Secondary hyperparathyroidism of renal origin: Secondary | ICD-10-CM | POA: Diagnosis not present

## 2015-11-25 DIAGNOSIS — N2581 Secondary hyperparathyroidism of renal origin: Secondary | ICD-10-CM | POA: Diagnosis not present

## 2015-11-25 DIAGNOSIS — N186 End stage renal disease: Secondary | ICD-10-CM | POA: Diagnosis not present

## 2015-11-27 DIAGNOSIS — N2581 Secondary hyperparathyroidism of renal origin: Secondary | ICD-10-CM | POA: Diagnosis not present

## 2015-11-27 DIAGNOSIS — N186 End stage renal disease: Secondary | ICD-10-CM | POA: Diagnosis not present

## 2015-11-30 DIAGNOSIS — Q612 Polycystic kidney, adult type: Secondary | ICD-10-CM | POA: Diagnosis not present

## 2015-11-30 DIAGNOSIS — Z992 Dependence on renal dialysis: Secondary | ICD-10-CM | POA: Diagnosis not present

## 2015-11-30 DIAGNOSIS — N2581 Secondary hyperparathyroidism of renal origin: Secondary | ICD-10-CM | POA: Diagnosis not present

## 2015-11-30 DIAGNOSIS — N186 End stage renal disease: Secondary | ICD-10-CM | POA: Diagnosis not present

## 2015-12-02 DIAGNOSIS — D631 Anemia in chronic kidney disease: Secondary | ICD-10-CM | POA: Diagnosis not present

## 2015-12-02 DIAGNOSIS — N2581 Secondary hyperparathyroidism of renal origin: Secondary | ICD-10-CM | POA: Diagnosis not present

## 2015-12-02 DIAGNOSIS — N186 End stage renal disease: Secondary | ICD-10-CM | POA: Diagnosis not present

## 2015-12-04 DIAGNOSIS — N2581 Secondary hyperparathyroidism of renal origin: Secondary | ICD-10-CM | POA: Diagnosis not present

## 2015-12-04 DIAGNOSIS — D631 Anemia in chronic kidney disease: Secondary | ICD-10-CM | POA: Diagnosis not present

## 2015-12-04 DIAGNOSIS — N186 End stage renal disease: Secondary | ICD-10-CM | POA: Diagnosis not present

## 2015-12-07 DIAGNOSIS — N186 End stage renal disease: Secondary | ICD-10-CM | POA: Diagnosis not present

## 2015-12-07 DIAGNOSIS — N2581 Secondary hyperparathyroidism of renal origin: Secondary | ICD-10-CM | POA: Diagnosis not present

## 2015-12-07 DIAGNOSIS — D631 Anemia in chronic kidney disease: Secondary | ICD-10-CM | POA: Diagnosis not present

## 2015-12-09 DIAGNOSIS — D631 Anemia in chronic kidney disease: Secondary | ICD-10-CM | POA: Diagnosis not present

## 2015-12-09 DIAGNOSIS — N2581 Secondary hyperparathyroidism of renal origin: Secondary | ICD-10-CM | POA: Diagnosis not present

## 2015-12-09 DIAGNOSIS — N186 End stage renal disease: Secondary | ICD-10-CM | POA: Diagnosis not present

## 2015-12-11 DIAGNOSIS — N186 End stage renal disease: Secondary | ICD-10-CM | POA: Diagnosis not present

## 2015-12-11 DIAGNOSIS — N2581 Secondary hyperparathyroidism of renal origin: Secondary | ICD-10-CM | POA: Diagnosis not present

## 2015-12-11 DIAGNOSIS — D631 Anemia in chronic kidney disease: Secondary | ICD-10-CM | POA: Diagnosis not present

## 2015-12-14 DIAGNOSIS — N2581 Secondary hyperparathyroidism of renal origin: Secondary | ICD-10-CM | POA: Diagnosis not present

## 2015-12-14 DIAGNOSIS — N186 End stage renal disease: Secondary | ICD-10-CM | POA: Diagnosis not present

## 2015-12-14 DIAGNOSIS — D631 Anemia in chronic kidney disease: Secondary | ICD-10-CM | POA: Diagnosis not present

## 2015-12-16 DIAGNOSIS — N2581 Secondary hyperparathyroidism of renal origin: Secondary | ICD-10-CM | POA: Diagnosis not present

## 2015-12-16 DIAGNOSIS — N186 End stage renal disease: Secondary | ICD-10-CM | POA: Diagnosis not present

## 2015-12-16 DIAGNOSIS — D631 Anemia in chronic kidney disease: Secondary | ICD-10-CM | POA: Diagnosis not present

## 2015-12-17 ENCOUNTER — Ambulatory Visit: Payer: Medicare Other | Admitting: Adult Health

## 2015-12-18 DIAGNOSIS — D631 Anemia in chronic kidney disease: Secondary | ICD-10-CM | POA: Diagnosis not present

## 2015-12-18 DIAGNOSIS — N186 End stage renal disease: Secondary | ICD-10-CM | POA: Diagnosis not present

## 2015-12-18 DIAGNOSIS — N2581 Secondary hyperparathyroidism of renal origin: Secondary | ICD-10-CM | POA: Diagnosis not present

## 2015-12-21 DIAGNOSIS — D631 Anemia in chronic kidney disease: Secondary | ICD-10-CM | POA: Diagnosis not present

## 2015-12-21 DIAGNOSIS — N186 End stage renal disease: Secondary | ICD-10-CM | POA: Diagnosis not present

## 2015-12-21 DIAGNOSIS — N2581 Secondary hyperparathyroidism of renal origin: Secondary | ICD-10-CM | POA: Diagnosis not present

## 2015-12-23 DIAGNOSIS — N2581 Secondary hyperparathyroidism of renal origin: Secondary | ICD-10-CM | POA: Diagnosis not present

## 2015-12-23 DIAGNOSIS — D631 Anemia in chronic kidney disease: Secondary | ICD-10-CM | POA: Diagnosis not present

## 2015-12-23 DIAGNOSIS — N186 End stage renal disease: Secondary | ICD-10-CM | POA: Diagnosis not present

## 2015-12-25 DIAGNOSIS — D631 Anemia in chronic kidney disease: Secondary | ICD-10-CM | POA: Diagnosis not present

## 2015-12-25 DIAGNOSIS — N2581 Secondary hyperparathyroidism of renal origin: Secondary | ICD-10-CM | POA: Diagnosis not present

## 2015-12-25 DIAGNOSIS — N186 End stage renal disease: Secondary | ICD-10-CM | POA: Diagnosis not present

## 2015-12-28 DIAGNOSIS — N186 End stage renal disease: Secondary | ICD-10-CM | POA: Diagnosis not present

## 2015-12-28 DIAGNOSIS — N2581 Secondary hyperparathyroidism of renal origin: Secondary | ICD-10-CM | POA: Diagnosis not present

## 2015-12-28 DIAGNOSIS — D631 Anemia in chronic kidney disease: Secondary | ICD-10-CM | POA: Diagnosis not present

## 2015-12-30 DIAGNOSIS — N2581 Secondary hyperparathyroidism of renal origin: Secondary | ICD-10-CM | POA: Diagnosis not present

## 2015-12-30 DIAGNOSIS — N186 End stage renal disease: Secondary | ICD-10-CM | POA: Diagnosis not present

## 2015-12-30 DIAGNOSIS — D631 Anemia in chronic kidney disease: Secondary | ICD-10-CM | POA: Diagnosis not present

## 2015-12-31 DIAGNOSIS — Z992 Dependence on renal dialysis: Secondary | ICD-10-CM | POA: Diagnosis not present

## 2015-12-31 DIAGNOSIS — Q612 Polycystic kidney, adult type: Secondary | ICD-10-CM | POA: Diagnosis not present

## 2015-12-31 DIAGNOSIS — N186 End stage renal disease: Secondary | ICD-10-CM | POA: Diagnosis not present

## 2016-01-01 DIAGNOSIS — N2581 Secondary hyperparathyroidism of renal origin: Secondary | ICD-10-CM | POA: Diagnosis not present

## 2016-01-01 DIAGNOSIS — N186 End stage renal disease: Secondary | ICD-10-CM | POA: Diagnosis not present

## 2016-01-04 DIAGNOSIS — N2581 Secondary hyperparathyroidism of renal origin: Secondary | ICD-10-CM | POA: Diagnosis not present

## 2016-01-04 DIAGNOSIS — N186 End stage renal disease: Secondary | ICD-10-CM | POA: Diagnosis not present

## 2016-01-06 DIAGNOSIS — N2581 Secondary hyperparathyroidism of renal origin: Secondary | ICD-10-CM | POA: Diagnosis not present

## 2016-01-06 DIAGNOSIS — N186 End stage renal disease: Secondary | ICD-10-CM | POA: Diagnosis not present

## 2016-01-08 DIAGNOSIS — N2581 Secondary hyperparathyroidism of renal origin: Secondary | ICD-10-CM | POA: Diagnosis not present

## 2016-01-08 DIAGNOSIS — N186 End stage renal disease: Secondary | ICD-10-CM | POA: Diagnosis not present

## 2016-01-11 DIAGNOSIS — N186 End stage renal disease: Secondary | ICD-10-CM | POA: Diagnosis not present

## 2016-01-11 DIAGNOSIS — N2581 Secondary hyperparathyroidism of renal origin: Secondary | ICD-10-CM | POA: Diagnosis not present

## 2016-01-13 DIAGNOSIS — N186 End stage renal disease: Secondary | ICD-10-CM | POA: Diagnosis not present

## 2016-01-13 DIAGNOSIS — N2581 Secondary hyperparathyroidism of renal origin: Secondary | ICD-10-CM | POA: Diagnosis not present

## 2016-01-15 DIAGNOSIS — N2581 Secondary hyperparathyroidism of renal origin: Secondary | ICD-10-CM | POA: Diagnosis not present

## 2016-01-15 DIAGNOSIS — N186 End stage renal disease: Secondary | ICD-10-CM | POA: Diagnosis not present

## 2016-01-18 DIAGNOSIS — N186 End stage renal disease: Secondary | ICD-10-CM | POA: Diagnosis not present

## 2016-01-18 DIAGNOSIS — N2581 Secondary hyperparathyroidism of renal origin: Secondary | ICD-10-CM | POA: Diagnosis not present

## 2016-01-20 DIAGNOSIS — N186 End stage renal disease: Secondary | ICD-10-CM | POA: Diagnosis not present

## 2016-01-20 DIAGNOSIS — Z23 Encounter for immunization: Secondary | ICD-10-CM | POA: Diagnosis not present

## 2016-01-20 DIAGNOSIS — N2581 Secondary hyperparathyroidism of renal origin: Secondary | ICD-10-CM | POA: Diagnosis not present

## 2016-01-22 DIAGNOSIS — N186 End stage renal disease: Secondary | ICD-10-CM | POA: Diagnosis not present

## 2016-01-22 DIAGNOSIS — N2581 Secondary hyperparathyroidism of renal origin: Secondary | ICD-10-CM | POA: Diagnosis not present

## 2016-01-25 DIAGNOSIS — N186 End stage renal disease: Secondary | ICD-10-CM | POA: Diagnosis not present

## 2016-01-25 DIAGNOSIS — N2581 Secondary hyperparathyroidism of renal origin: Secondary | ICD-10-CM | POA: Diagnosis not present

## 2016-01-27 DIAGNOSIS — N2581 Secondary hyperparathyroidism of renal origin: Secondary | ICD-10-CM | POA: Diagnosis not present

## 2016-01-27 DIAGNOSIS — N186 End stage renal disease: Secondary | ICD-10-CM | POA: Diagnosis not present

## 2016-01-29 DIAGNOSIS — N2581 Secondary hyperparathyroidism of renal origin: Secondary | ICD-10-CM | POA: Diagnosis not present

## 2016-01-29 DIAGNOSIS — N186 End stage renal disease: Secondary | ICD-10-CM | POA: Diagnosis not present

## 2016-01-30 DIAGNOSIS — Z992 Dependence on renal dialysis: Secondary | ICD-10-CM | POA: Diagnosis not present

## 2016-01-30 DIAGNOSIS — Q612 Polycystic kidney, adult type: Secondary | ICD-10-CM | POA: Diagnosis not present

## 2016-01-30 DIAGNOSIS — N186 End stage renal disease: Secondary | ICD-10-CM | POA: Diagnosis not present

## 2016-02-01 DIAGNOSIS — N2581 Secondary hyperparathyroidism of renal origin: Secondary | ICD-10-CM | POA: Diagnosis not present

## 2016-02-01 DIAGNOSIS — N186 End stage renal disease: Secondary | ICD-10-CM | POA: Diagnosis not present

## 2016-02-03 DIAGNOSIS — N2581 Secondary hyperparathyroidism of renal origin: Secondary | ICD-10-CM | POA: Diagnosis not present

## 2016-02-03 DIAGNOSIS — N186 End stage renal disease: Secondary | ICD-10-CM | POA: Diagnosis not present

## 2016-02-05 DIAGNOSIS — N186 End stage renal disease: Secondary | ICD-10-CM | POA: Diagnosis not present

## 2016-02-05 DIAGNOSIS — N2581 Secondary hyperparathyroidism of renal origin: Secondary | ICD-10-CM | POA: Diagnosis not present

## 2016-02-08 DIAGNOSIS — N2581 Secondary hyperparathyroidism of renal origin: Secondary | ICD-10-CM | POA: Diagnosis not present

## 2016-02-08 DIAGNOSIS — N186 End stage renal disease: Secondary | ICD-10-CM | POA: Diagnosis not present

## 2016-02-10 DIAGNOSIS — N186 End stage renal disease: Secondary | ICD-10-CM | POA: Diagnosis not present

## 2016-02-10 DIAGNOSIS — N2581 Secondary hyperparathyroidism of renal origin: Secondary | ICD-10-CM | POA: Diagnosis not present

## 2016-02-12 DIAGNOSIS — N2581 Secondary hyperparathyroidism of renal origin: Secondary | ICD-10-CM | POA: Diagnosis not present

## 2016-02-12 DIAGNOSIS — N186 End stage renal disease: Secondary | ICD-10-CM | POA: Diagnosis not present

## 2016-02-15 DIAGNOSIS — N2581 Secondary hyperparathyroidism of renal origin: Secondary | ICD-10-CM | POA: Diagnosis not present

## 2016-02-15 DIAGNOSIS — N186 End stage renal disease: Secondary | ICD-10-CM | POA: Diagnosis not present

## 2016-02-17 DIAGNOSIS — N2581 Secondary hyperparathyroidism of renal origin: Secondary | ICD-10-CM | POA: Diagnosis not present

## 2016-02-17 DIAGNOSIS — N186 End stage renal disease: Secondary | ICD-10-CM | POA: Diagnosis not present

## 2016-02-19 DIAGNOSIS — N2581 Secondary hyperparathyroidism of renal origin: Secondary | ICD-10-CM | POA: Diagnosis not present

## 2016-02-19 DIAGNOSIS — N186 End stage renal disease: Secondary | ICD-10-CM | POA: Diagnosis not present

## 2016-02-22 DIAGNOSIS — N186 End stage renal disease: Secondary | ICD-10-CM | POA: Diagnosis not present

## 2016-02-22 DIAGNOSIS — N2581 Secondary hyperparathyroidism of renal origin: Secondary | ICD-10-CM | POA: Diagnosis not present

## 2016-02-24 DIAGNOSIS — N186 End stage renal disease: Secondary | ICD-10-CM | POA: Diagnosis not present

## 2016-02-24 DIAGNOSIS — N2581 Secondary hyperparathyroidism of renal origin: Secondary | ICD-10-CM | POA: Diagnosis not present

## 2016-02-26 DIAGNOSIS — N2581 Secondary hyperparathyroidism of renal origin: Secondary | ICD-10-CM | POA: Diagnosis not present

## 2016-02-26 DIAGNOSIS — N186 End stage renal disease: Secondary | ICD-10-CM | POA: Diagnosis not present

## 2016-02-29 DIAGNOSIS — N2581 Secondary hyperparathyroidism of renal origin: Secondary | ICD-10-CM | POA: Diagnosis not present

## 2016-02-29 DIAGNOSIS — N186 End stage renal disease: Secondary | ICD-10-CM | POA: Diagnosis not present

## 2016-03-01 DIAGNOSIS — Q612 Polycystic kidney, adult type: Secondary | ICD-10-CM | POA: Diagnosis not present

## 2016-03-01 DIAGNOSIS — Z992 Dependence on renal dialysis: Secondary | ICD-10-CM | POA: Diagnosis not present

## 2016-03-01 DIAGNOSIS — N186 End stage renal disease: Secondary | ICD-10-CM | POA: Diagnosis not present

## 2016-03-02 DIAGNOSIS — N2581 Secondary hyperparathyroidism of renal origin: Secondary | ICD-10-CM | POA: Diagnosis not present

## 2016-03-02 DIAGNOSIS — D631 Anemia in chronic kidney disease: Secondary | ICD-10-CM | POA: Diagnosis not present

## 2016-03-02 DIAGNOSIS — N186 End stage renal disease: Secondary | ICD-10-CM | POA: Diagnosis not present

## 2016-03-04 DIAGNOSIS — N2581 Secondary hyperparathyroidism of renal origin: Secondary | ICD-10-CM | POA: Diagnosis not present

## 2016-03-04 DIAGNOSIS — D631 Anemia in chronic kidney disease: Secondary | ICD-10-CM | POA: Diagnosis not present

## 2016-03-04 DIAGNOSIS — N186 End stage renal disease: Secondary | ICD-10-CM | POA: Diagnosis not present

## 2016-03-07 DIAGNOSIS — N186 End stage renal disease: Secondary | ICD-10-CM | POA: Diagnosis not present

## 2016-03-07 DIAGNOSIS — D631 Anemia in chronic kidney disease: Secondary | ICD-10-CM | POA: Diagnosis not present

## 2016-03-07 DIAGNOSIS — N2581 Secondary hyperparathyroidism of renal origin: Secondary | ICD-10-CM | POA: Diagnosis not present

## 2016-03-09 DIAGNOSIS — D631 Anemia in chronic kidney disease: Secondary | ICD-10-CM | POA: Diagnosis not present

## 2016-03-09 DIAGNOSIS — N186 End stage renal disease: Secondary | ICD-10-CM | POA: Diagnosis not present

## 2016-03-09 DIAGNOSIS — N2581 Secondary hyperparathyroidism of renal origin: Secondary | ICD-10-CM | POA: Diagnosis not present

## 2016-03-11 DIAGNOSIS — N186 End stage renal disease: Secondary | ICD-10-CM | POA: Diagnosis not present

## 2016-03-11 DIAGNOSIS — D631 Anemia in chronic kidney disease: Secondary | ICD-10-CM | POA: Diagnosis not present

## 2016-03-11 DIAGNOSIS — N2581 Secondary hyperparathyroidism of renal origin: Secondary | ICD-10-CM | POA: Diagnosis not present

## 2016-03-14 DIAGNOSIS — N186 End stage renal disease: Secondary | ICD-10-CM | POA: Diagnosis not present

## 2016-03-14 DIAGNOSIS — N2581 Secondary hyperparathyroidism of renal origin: Secondary | ICD-10-CM | POA: Diagnosis not present

## 2016-03-14 DIAGNOSIS — D631 Anemia in chronic kidney disease: Secondary | ICD-10-CM | POA: Diagnosis not present

## 2016-03-16 DIAGNOSIS — N186 End stage renal disease: Secondary | ICD-10-CM | POA: Diagnosis not present

## 2016-03-16 DIAGNOSIS — D631 Anemia in chronic kidney disease: Secondary | ICD-10-CM | POA: Diagnosis not present

## 2016-03-16 DIAGNOSIS — N2581 Secondary hyperparathyroidism of renal origin: Secondary | ICD-10-CM | POA: Diagnosis not present

## 2016-03-18 DIAGNOSIS — N2581 Secondary hyperparathyroidism of renal origin: Secondary | ICD-10-CM | POA: Diagnosis not present

## 2016-03-18 DIAGNOSIS — D631 Anemia in chronic kidney disease: Secondary | ICD-10-CM | POA: Diagnosis not present

## 2016-03-18 DIAGNOSIS — N186 End stage renal disease: Secondary | ICD-10-CM | POA: Diagnosis not present

## 2016-03-20 DIAGNOSIS — N186 End stage renal disease: Secondary | ICD-10-CM | POA: Diagnosis not present

## 2016-03-20 DIAGNOSIS — D631 Anemia in chronic kidney disease: Secondary | ICD-10-CM | POA: Diagnosis not present

## 2016-03-20 DIAGNOSIS — N2581 Secondary hyperparathyroidism of renal origin: Secondary | ICD-10-CM | POA: Diagnosis not present

## 2016-03-22 DIAGNOSIS — N186 End stage renal disease: Secondary | ICD-10-CM | POA: Diagnosis not present

## 2016-03-22 DIAGNOSIS — N2581 Secondary hyperparathyroidism of renal origin: Secondary | ICD-10-CM | POA: Diagnosis not present

## 2016-03-22 DIAGNOSIS — D631 Anemia in chronic kidney disease: Secondary | ICD-10-CM | POA: Diagnosis not present

## 2016-03-25 DIAGNOSIS — N2581 Secondary hyperparathyroidism of renal origin: Secondary | ICD-10-CM | POA: Diagnosis not present

## 2016-03-25 DIAGNOSIS — D631 Anemia in chronic kidney disease: Secondary | ICD-10-CM | POA: Diagnosis not present

## 2016-03-25 DIAGNOSIS — N186 End stage renal disease: Secondary | ICD-10-CM | POA: Diagnosis not present

## 2016-03-28 DIAGNOSIS — N2581 Secondary hyperparathyroidism of renal origin: Secondary | ICD-10-CM | POA: Diagnosis not present

## 2016-03-28 DIAGNOSIS — N186 End stage renal disease: Secondary | ICD-10-CM | POA: Diagnosis not present

## 2016-03-28 DIAGNOSIS — D631 Anemia in chronic kidney disease: Secondary | ICD-10-CM | POA: Diagnosis not present

## 2016-03-30 DIAGNOSIS — N2581 Secondary hyperparathyroidism of renal origin: Secondary | ICD-10-CM | POA: Diagnosis not present

## 2016-03-30 DIAGNOSIS — N186 End stage renal disease: Secondary | ICD-10-CM | POA: Diagnosis not present

## 2016-03-30 DIAGNOSIS — D631 Anemia in chronic kidney disease: Secondary | ICD-10-CM | POA: Diagnosis not present

## 2016-03-31 DIAGNOSIS — Z992 Dependence on renal dialysis: Secondary | ICD-10-CM | POA: Diagnosis not present

## 2016-03-31 DIAGNOSIS — Q612 Polycystic kidney, adult type: Secondary | ICD-10-CM | POA: Diagnosis not present

## 2016-03-31 DIAGNOSIS — N186 End stage renal disease: Secondary | ICD-10-CM | POA: Diagnosis not present

## 2016-04-01 DIAGNOSIS — D631 Anemia in chronic kidney disease: Secondary | ICD-10-CM | POA: Diagnosis not present

## 2016-04-01 DIAGNOSIS — N186 End stage renal disease: Secondary | ICD-10-CM | POA: Diagnosis not present

## 2016-04-01 DIAGNOSIS — N2581 Secondary hyperparathyroidism of renal origin: Secondary | ICD-10-CM | POA: Diagnosis not present

## 2016-04-04 DIAGNOSIS — N186 End stage renal disease: Secondary | ICD-10-CM | POA: Diagnosis not present

## 2016-04-04 DIAGNOSIS — D631 Anemia in chronic kidney disease: Secondary | ICD-10-CM | POA: Diagnosis not present

## 2016-04-04 DIAGNOSIS — N2581 Secondary hyperparathyroidism of renal origin: Secondary | ICD-10-CM | POA: Diagnosis not present

## 2016-04-06 DIAGNOSIS — N2581 Secondary hyperparathyroidism of renal origin: Secondary | ICD-10-CM | POA: Diagnosis not present

## 2016-04-06 DIAGNOSIS — N186 End stage renal disease: Secondary | ICD-10-CM | POA: Diagnosis not present

## 2016-04-06 DIAGNOSIS — D631 Anemia in chronic kidney disease: Secondary | ICD-10-CM | POA: Diagnosis not present

## 2016-04-11 DIAGNOSIS — D631 Anemia in chronic kidney disease: Secondary | ICD-10-CM | POA: Diagnosis not present

## 2016-04-11 DIAGNOSIS — N186 End stage renal disease: Secondary | ICD-10-CM | POA: Diagnosis not present

## 2016-04-11 DIAGNOSIS — N2581 Secondary hyperparathyroidism of renal origin: Secondary | ICD-10-CM | POA: Diagnosis not present

## 2016-04-13 DIAGNOSIS — N2581 Secondary hyperparathyroidism of renal origin: Secondary | ICD-10-CM | POA: Diagnosis not present

## 2016-04-13 DIAGNOSIS — N186 End stage renal disease: Secondary | ICD-10-CM | POA: Diagnosis not present

## 2016-04-13 DIAGNOSIS — D631 Anemia in chronic kidney disease: Secondary | ICD-10-CM | POA: Diagnosis not present

## 2016-04-15 DIAGNOSIS — D631 Anemia in chronic kidney disease: Secondary | ICD-10-CM | POA: Diagnosis not present

## 2016-04-15 DIAGNOSIS — N2581 Secondary hyperparathyroidism of renal origin: Secondary | ICD-10-CM | POA: Diagnosis not present

## 2016-04-15 DIAGNOSIS — N186 End stage renal disease: Secondary | ICD-10-CM | POA: Diagnosis not present

## 2016-04-18 DIAGNOSIS — N2581 Secondary hyperparathyroidism of renal origin: Secondary | ICD-10-CM | POA: Diagnosis not present

## 2016-04-18 DIAGNOSIS — D631 Anemia in chronic kidney disease: Secondary | ICD-10-CM | POA: Diagnosis not present

## 2016-04-18 DIAGNOSIS — N186 End stage renal disease: Secondary | ICD-10-CM | POA: Diagnosis not present

## 2016-04-20 DIAGNOSIS — N186 End stage renal disease: Secondary | ICD-10-CM | POA: Diagnosis not present

## 2016-04-20 DIAGNOSIS — N2581 Secondary hyperparathyroidism of renal origin: Secondary | ICD-10-CM | POA: Diagnosis not present

## 2016-04-20 DIAGNOSIS — D631 Anemia in chronic kidney disease: Secondary | ICD-10-CM | POA: Diagnosis not present

## 2016-04-22 DIAGNOSIS — D631 Anemia in chronic kidney disease: Secondary | ICD-10-CM | POA: Diagnosis not present

## 2016-04-22 DIAGNOSIS — N2581 Secondary hyperparathyroidism of renal origin: Secondary | ICD-10-CM | POA: Diagnosis not present

## 2016-04-22 DIAGNOSIS — N186 End stage renal disease: Secondary | ICD-10-CM | POA: Diagnosis not present

## 2016-04-24 DIAGNOSIS — D631 Anemia in chronic kidney disease: Secondary | ICD-10-CM | POA: Diagnosis not present

## 2016-04-24 DIAGNOSIS — N2581 Secondary hyperparathyroidism of renal origin: Secondary | ICD-10-CM | POA: Diagnosis not present

## 2016-04-24 DIAGNOSIS — N186 End stage renal disease: Secondary | ICD-10-CM | POA: Diagnosis not present

## 2016-04-27 DIAGNOSIS — D631 Anemia in chronic kidney disease: Secondary | ICD-10-CM | POA: Diagnosis not present

## 2016-04-27 DIAGNOSIS — N186 End stage renal disease: Secondary | ICD-10-CM | POA: Diagnosis not present

## 2016-04-27 DIAGNOSIS — N2581 Secondary hyperparathyroidism of renal origin: Secondary | ICD-10-CM | POA: Diagnosis not present

## 2016-04-29 DIAGNOSIS — N2581 Secondary hyperparathyroidism of renal origin: Secondary | ICD-10-CM | POA: Diagnosis not present

## 2016-04-29 DIAGNOSIS — N186 End stage renal disease: Secondary | ICD-10-CM | POA: Diagnosis not present

## 2016-04-29 DIAGNOSIS — D631 Anemia in chronic kidney disease: Secondary | ICD-10-CM | POA: Diagnosis not present

## 2016-05-01 DIAGNOSIS — N186 End stage renal disease: Secondary | ICD-10-CM | POA: Diagnosis not present

## 2016-05-01 DIAGNOSIS — Z992 Dependence on renal dialysis: Secondary | ICD-10-CM | POA: Diagnosis not present

## 2016-05-01 DIAGNOSIS — Q612 Polycystic kidney, adult type: Secondary | ICD-10-CM | POA: Diagnosis not present

## 2016-05-03 DIAGNOSIS — N186 End stage renal disease: Secondary | ICD-10-CM | POA: Diagnosis not present

## 2016-05-03 DIAGNOSIS — N2581 Secondary hyperparathyroidism of renal origin: Secondary | ICD-10-CM | POA: Diagnosis not present

## 2016-05-03 DIAGNOSIS — D631 Anemia in chronic kidney disease: Secondary | ICD-10-CM | POA: Diagnosis not present

## 2016-05-04 DIAGNOSIS — N186 End stage renal disease: Secondary | ICD-10-CM | POA: Diagnosis not present

## 2016-05-04 DIAGNOSIS — D631 Anemia in chronic kidney disease: Secondary | ICD-10-CM | POA: Diagnosis not present

## 2016-05-04 DIAGNOSIS — N2581 Secondary hyperparathyroidism of renal origin: Secondary | ICD-10-CM | POA: Diagnosis not present

## 2016-05-06 DIAGNOSIS — N2581 Secondary hyperparathyroidism of renal origin: Secondary | ICD-10-CM | POA: Diagnosis not present

## 2016-05-06 DIAGNOSIS — D631 Anemia in chronic kidney disease: Secondary | ICD-10-CM | POA: Diagnosis not present

## 2016-05-06 DIAGNOSIS — N186 End stage renal disease: Secondary | ICD-10-CM | POA: Diagnosis not present

## 2016-05-09 DIAGNOSIS — D631 Anemia in chronic kidney disease: Secondary | ICD-10-CM | POA: Diagnosis not present

## 2016-05-09 DIAGNOSIS — N186 End stage renal disease: Secondary | ICD-10-CM | POA: Diagnosis not present

## 2016-05-09 DIAGNOSIS — N2581 Secondary hyperparathyroidism of renal origin: Secondary | ICD-10-CM | POA: Diagnosis not present

## 2016-05-11 DIAGNOSIS — N2581 Secondary hyperparathyroidism of renal origin: Secondary | ICD-10-CM | POA: Diagnosis not present

## 2016-05-11 DIAGNOSIS — N186 End stage renal disease: Secondary | ICD-10-CM | POA: Diagnosis not present

## 2016-05-11 DIAGNOSIS — D631 Anemia in chronic kidney disease: Secondary | ICD-10-CM | POA: Diagnosis not present

## 2016-05-13 DIAGNOSIS — N186 End stage renal disease: Secondary | ICD-10-CM | POA: Diagnosis not present

## 2016-05-13 DIAGNOSIS — N2581 Secondary hyperparathyroidism of renal origin: Secondary | ICD-10-CM | POA: Diagnosis not present

## 2016-05-13 DIAGNOSIS — D631 Anemia in chronic kidney disease: Secondary | ICD-10-CM | POA: Diagnosis not present

## 2016-05-16 DIAGNOSIS — N186 End stage renal disease: Secondary | ICD-10-CM | POA: Diagnosis not present

## 2016-05-16 DIAGNOSIS — D631 Anemia in chronic kidney disease: Secondary | ICD-10-CM | POA: Diagnosis not present

## 2016-05-16 DIAGNOSIS — N2581 Secondary hyperparathyroidism of renal origin: Secondary | ICD-10-CM | POA: Diagnosis not present

## 2016-05-18 DIAGNOSIS — D631 Anemia in chronic kidney disease: Secondary | ICD-10-CM | POA: Diagnosis not present

## 2016-05-18 DIAGNOSIS — N2581 Secondary hyperparathyroidism of renal origin: Secondary | ICD-10-CM | POA: Diagnosis not present

## 2016-05-18 DIAGNOSIS — N186 End stage renal disease: Secondary | ICD-10-CM | POA: Diagnosis not present

## 2016-05-20 DIAGNOSIS — D631 Anemia in chronic kidney disease: Secondary | ICD-10-CM | POA: Diagnosis not present

## 2016-05-20 DIAGNOSIS — N2581 Secondary hyperparathyroidism of renal origin: Secondary | ICD-10-CM | POA: Diagnosis not present

## 2016-05-20 DIAGNOSIS — N186 End stage renal disease: Secondary | ICD-10-CM | POA: Diagnosis not present

## 2016-05-23 DIAGNOSIS — N186 End stage renal disease: Secondary | ICD-10-CM | POA: Diagnosis not present

## 2016-05-23 DIAGNOSIS — N2581 Secondary hyperparathyroidism of renal origin: Secondary | ICD-10-CM | POA: Diagnosis not present

## 2016-05-23 DIAGNOSIS — D631 Anemia in chronic kidney disease: Secondary | ICD-10-CM | POA: Diagnosis not present

## 2016-05-25 DIAGNOSIS — N186 End stage renal disease: Secondary | ICD-10-CM | POA: Diagnosis not present

## 2016-05-25 DIAGNOSIS — N2581 Secondary hyperparathyroidism of renal origin: Secondary | ICD-10-CM | POA: Diagnosis not present

## 2016-05-25 DIAGNOSIS — D631 Anemia in chronic kidney disease: Secondary | ICD-10-CM | POA: Diagnosis not present

## 2016-05-27 DIAGNOSIS — D631 Anemia in chronic kidney disease: Secondary | ICD-10-CM | POA: Diagnosis not present

## 2016-05-27 DIAGNOSIS — N186 End stage renal disease: Secondary | ICD-10-CM | POA: Diagnosis not present

## 2016-05-27 DIAGNOSIS — N2581 Secondary hyperparathyroidism of renal origin: Secondary | ICD-10-CM | POA: Diagnosis not present

## 2016-05-30 DIAGNOSIS — N186 End stage renal disease: Secondary | ICD-10-CM | POA: Diagnosis not present

## 2016-05-30 DIAGNOSIS — D631 Anemia in chronic kidney disease: Secondary | ICD-10-CM | POA: Diagnosis not present

## 2016-05-30 DIAGNOSIS — N2581 Secondary hyperparathyroidism of renal origin: Secondary | ICD-10-CM | POA: Diagnosis not present

## 2016-06-01 DIAGNOSIS — D631 Anemia in chronic kidney disease: Secondary | ICD-10-CM | POA: Diagnosis not present

## 2016-06-01 DIAGNOSIS — N2581 Secondary hyperparathyroidism of renal origin: Secondary | ICD-10-CM | POA: Diagnosis not present

## 2016-06-01 DIAGNOSIS — Z992 Dependence on renal dialysis: Secondary | ICD-10-CM | POA: Diagnosis not present

## 2016-06-01 DIAGNOSIS — N186 End stage renal disease: Secondary | ICD-10-CM | POA: Diagnosis not present

## 2016-06-01 DIAGNOSIS — Q612 Polycystic kidney, adult type: Secondary | ICD-10-CM | POA: Diagnosis not present

## 2016-06-03 DIAGNOSIS — N186 End stage renal disease: Secondary | ICD-10-CM | POA: Diagnosis not present

## 2016-06-03 DIAGNOSIS — N2581 Secondary hyperparathyroidism of renal origin: Secondary | ICD-10-CM | POA: Diagnosis not present

## 2016-06-06 DIAGNOSIS — N186 End stage renal disease: Secondary | ICD-10-CM | POA: Diagnosis not present

## 2016-06-06 DIAGNOSIS — N2581 Secondary hyperparathyroidism of renal origin: Secondary | ICD-10-CM | POA: Diagnosis not present

## 2016-06-08 DIAGNOSIS — N186 End stage renal disease: Secondary | ICD-10-CM | POA: Diagnosis not present

## 2016-06-08 DIAGNOSIS — N2581 Secondary hyperparathyroidism of renal origin: Secondary | ICD-10-CM | POA: Diagnosis not present

## 2016-06-10 DIAGNOSIS — N186 End stage renal disease: Secondary | ICD-10-CM | POA: Diagnosis not present

## 2016-06-10 DIAGNOSIS — N2581 Secondary hyperparathyroidism of renal origin: Secondary | ICD-10-CM | POA: Diagnosis not present

## 2016-06-13 DIAGNOSIS — N186 End stage renal disease: Secondary | ICD-10-CM | POA: Diagnosis not present

## 2016-06-13 DIAGNOSIS — N2581 Secondary hyperparathyroidism of renal origin: Secondary | ICD-10-CM | POA: Diagnosis not present

## 2016-06-15 DIAGNOSIS — N186 End stage renal disease: Secondary | ICD-10-CM | POA: Diagnosis not present

## 2016-06-15 DIAGNOSIS — N2581 Secondary hyperparathyroidism of renal origin: Secondary | ICD-10-CM | POA: Diagnosis not present

## 2016-06-17 DIAGNOSIS — N2581 Secondary hyperparathyroidism of renal origin: Secondary | ICD-10-CM | POA: Diagnosis not present

## 2016-06-17 DIAGNOSIS — N186 End stage renal disease: Secondary | ICD-10-CM | POA: Diagnosis not present

## 2016-06-20 DIAGNOSIS — N2581 Secondary hyperparathyroidism of renal origin: Secondary | ICD-10-CM | POA: Diagnosis not present

## 2016-06-20 DIAGNOSIS — N186 End stage renal disease: Secondary | ICD-10-CM | POA: Diagnosis not present

## 2016-06-22 DIAGNOSIS — N2581 Secondary hyperparathyroidism of renal origin: Secondary | ICD-10-CM | POA: Diagnosis not present

## 2016-06-22 DIAGNOSIS — N186 End stage renal disease: Secondary | ICD-10-CM | POA: Diagnosis not present

## 2016-06-24 DIAGNOSIS — N2581 Secondary hyperparathyroidism of renal origin: Secondary | ICD-10-CM | POA: Diagnosis not present

## 2016-06-24 DIAGNOSIS — N186 End stage renal disease: Secondary | ICD-10-CM | POA: Diagnosis not present

## 2016-06-27 DIAGNOSIS — N2581 Secondary hyperparathyroidism of renal origin: Secondary | ICD-10-CM | POA: Diagnosis not present

## 2016-06-27 DIAGNOSIS — N186 End stage renal disease: Secondary | ICD-10-CM | POA: Diagnosis not present

## 2016-06-29 DIAGNOSIS — N186 End stage renal disease: Secondary | ICD-10-CM | POA: Diagnosis not present

## 2016-06-29 DIAGNOSIS — Z992 Dependence on renal dialysis: Secondary | ICD-10-CM | POA: Diagnosis not present

## 2016-06-29 DIAGNOSIS — Q612 Polycystic kidney, adult type: Secondary | ICD-10-CM | POA: Diagnosis not present

## 2016-06-29 DIAGNOSIS — N2581 Secondary hyperparathyroidism of renal origin: Secondary | ICD-10-CM | POA: Diagnosis not present

## 2016-07-01 DIAGNOSIS — N186 End stage renal disease: Secondary | ICD-10-CM | POA: Diagnosis not present

## 2016-07-01 DIAGNOSIS — N2581 Secondary hyperparathyroidism of renal origin: Secondary | ICD-10-CM | POA: Diagnosis not present

## 2016-07-04 DIAGNOSIS — N186 End stage renal disease: Secondary | ICD-10-CM | POA: Diagnosis not present

## 2016-07-04 DIAGNOSIS — N2581 Secondary hyperparathyroidism of renal origin: Secondary | ICD-10-CM | POA: Diagnosis not present

## 2016-07-06 DIAGNOSIS — N186 End stage renal disease: Secondary | ICD-10-CM | POA: Diagnosis not present

## 2016-07-06 DIAGNOSIS — N2581 Secondary hyperparathyroidism of renal origin: Secondary | ICD-10-CM | POA: Diagnosis not present

## 2016-07-08 DIAGNOSIS — N186 End stage renal disease: Secondary | ICD-10-CM | POA: Diagnosis not present

## 2016-07-08 DIAGNOSIS — N2581 Secondary hyperparathyroidism of renal origin: Secondary | ICD-10-CM | POA: Diagnosis not present

## 2016-07-11 DIAGNOSIS — N186 End stage renal disease: Secondary | ICD-10-CM | POA: Diagnosis not present

## 2016-07-11 DIAGNOSIS — N2581 Secondary hyperparathyroidism of renal origin: Secondary | ICD-10-CM | POA: Diagnosis not present

## 2016-07-13 DIAGNOSIS — N186 End stage renal disease: Secondary | ICD-10-CM | POA: Diagnosis not present

## 2016-07-13 DIAGNOSIS — N2581 Secondary hyperparathyroidism of renal origin: Secondary | ICD-10-CM | POA: Diagnosis not present

## 2016-07-15 DIAGNOSIS — N2581 Secondary hyperparathyroidism of renal origin: Secondary | ICD-10-CM | POA: Diagnosis not present

## 2016-07-15 DIAGNOSIS — N186 End stage renal disease: Secondary | ICD-10-CM | POA: Diagnosis not present

## 2016-07-18 DIAGNOSIS — N186 End stage renal disease: Secondary | ICD-10-CM | POA: Diagnosis not present

## 2016-07-18 DIAGNOSIS — N2581 Secondary hyperparathyroidism of renal origin: Secondary | ICD-10-CM | POA: Diagnosis not present

## 2016-07-20 DIAGNOSIS — N2581 Secondary hyperparathyroidism of renal origin: Secondary | ICD-10-CM | POA: Diagnosis not present

## 2016-07-20 DIAGNOSIS — N186 End stage renal disease: Secondary | ICD-10-CM | POA: Diagnosis not present

## 2016-07-22 DIAGNOSIS — N2581 Secondary hyperparathyroidism of renal origin: Secondary | ICD-10-CM | POA: Diagnosis not present

## 2016-07-22 DIAGNOSIS — N186 End stage renal disease: Secondary | ICD-10-CM | POA: Diagnosis not present

## 2016-07-25 DIAGNOSIS — N2581 Secondary hyperparathyroidism of renal origin: Secondary | ICD-10-CM | POA: Diagnosis not present

## 2016-07-25 DIAGNOSIS — N186 End stage renal disease: Secondary | ICD-10-CM | POA: Diagnosis not present

## 2016-07-27 DIAGNOSIS — N2581 Secondary hyperparathyroidism of renal origin: Secondary | ICD-10-CM | POA: Diagnosis not present

## 2016-07-27 DIAGNOSIS — N186 End stage renal disease: Secondary | ICD-10-CM | POA: Diagnosis not present

## 2016-07-28 DIAGNOSIS — E782 Mixed hyperlipidemia: Secondary | ICD-10-CM | POA: Diagnosis not present

## 2016-07-28 DIAGNOSIS — N529 Male erectile dysfunction, unspecified: Secondary | ICD-10-CM | POA: Diagnosis not present

## 2016-07-28 DIAGNOSIS — I440 Atrioventricular block, first degree: Secondary | ICD-10-CM | POA: Diagnosis not present

## 2016-07-28 DIAGNOSIS — D485 Neoplasm of uncertain behavior of skin: Secondary | ICD-10-CM | POA: Diagnosis not present

## 2016-07-28 DIAGNOSIS — M109 Gout, unspecified: Secondary | ICD-10-CM | POA: Diagnosis not present

## 2016-07-28 DIAGNOSIS — I498 Other specified cardiac arrhythmias: Secondary | ICD-10-CM | POA: Diagnosis not present

## 2016-07-28 DIAGNOSIS — I120 Hypertensive chronic kidney disease with stage 5 chronic kidney disease or end stage renal disease: Secondary | ICD-10-CM | POA: Diagnosis not present

## 2016-07-28 DIAGNOSIS — I493 Ventricular premature depolarization: Secondary | ICD-10-CM | POA: Diagnosis not present

## 2016-07-28 DIAGNOSIS — N186 End stage renal disease: Secondary | ICD-10-CM | POA: Diagnosis not present

## 2016-07-28 DIAGNOSIS — R6882 Decreased libido: Secondary | ICD-10-CM | POA: Diagnosis not present

## 2016-07-28 DIAGNOSIS — R002 Palpitations: Secondary | ICD-10-CM | POA: Diagnosis not present

## 2016-07-28 DIAGNOSIS — D696 Thrombocytopenia, unspecified: Secondary | ICD-10-CM | POA: Diagnosis not present

## 2016-07-29 DIAGNOSIS — N2581 Secondary hyperparathyroidism of renal origin: Secondary | ICD-10-CM | POA: Diagnosis not present

## 2016-07-29 DIAGNOSIS — N186 End stage renal disease: Secondary | ICD-10-CM | POA: Diagnosis not present

## 2016-07-30 DIAGNOSIS — Q612 Polycystic kidney, adult type: Secondary | ICD-10-CM | POA: Diagnosis not present

## 2016-07-30 DIAGNOSIS — N186 End stage renal disease: Secondary | ICD-10-CM | POA: Diagnosis not present

## 2016-07-30 DIAGNOSIS — Z992 Dependence on renal dialysis: Secondary | ICD-10-CM | POA: Diagnosis not present

## 2016-08-01 DIAGNOSIS — N2581 Secondary hyperparathyroidism of renal origin: Secondary | ICD-10-CM | POA: Diagnosis not present

## 2016-08-01 DIAGNOSIS — N186 End stage renal disease: Secondary | ICD-10-CM | POA: Diagnosis not present

## 2016-08-03 ENCOUNTER — Ambulatory Visit: Payer: BC Managed Care – PPO | Admitting: Vascular Surgery

## 2016-08-03 DIAGNOSIS — N2581 Secondary hyperparathyroidism of renal origin: Secondary | ICD-10-CM | POA: Diagnosis not present

## 2016-08-03 DIAGNOSIS — N186 End stage renal disease: Secondary | ICD-10-CM | POA: Diagnosis not present

## 2016-08-04 DIAGNOSIS — E782 Mixed hyperlipidemia: Secondary | ICD-10-CM | POA: Diagnosis not present

## 2016-08-04 DIAGNOSIS — D696 Thrombocytopenia, unspecified: Secondary | ICD-10-CM | POA: Diagnosis not present

## 2016-08-04 DIAGNOSIS — I120 Hypertensive chronic kidney disease with stage 5 chronic kidney disease or end stage renal disease: Secondary | ICD-10-CM | POA: Diagnosis not present

## 2016-08-04 DIAGNOSIS — R6882 Decreased libido: Secondary | ICD-10-CM | POA: Diagnosis not present

## 2016-08-04 DIAGNOSIS — M109 Gout, unspecified: Secondary | ICD-10-CM | POA: Diagnosis not present

## 2016-08-05 DIAGNOSIS — N2581 Secondary hyperparathyroidism of renal origin: Secondary | ICD-10-CM | POA: Diagnosis not present

## 2016-08-05 DIAGNOSIS — N186 End stage renal disease: Secondary | ICD-10-CM | POA: Diagnosis not present

## 2016-08-08 DIAGNOSIS — N2581 Secondary hyperparathyroidism of renal origin: Secondary | ICD-10-CM | POA: Diagnosis not present

## 2016-08-08 DIAGNOSIS — N186 End stage renal disease: Secondary | ICD-10-CM | POA: Diagnosis not present

## 2016-08-10 DIAGNOSIS — N2581 Secondary hyperparathyroidism of renal origin: Secondary | ICD-10-CM | POA: Diagnosis not present

## 2016-08-10 DIAGNOSIS — N186 End stage renal disease: Secondary | ICD-10-CM | POA: Diagnosis not present

## 2016-08-12 DIAGNOSIS — N2581 Secondary hyperparathyroidism of renal origin: Secondary | ICD-10-CM | POA: Diagnosis not present

## 2016-08-12 DIAGNOSIS — N186 End stage renal disease: Secondary | ICD-10-CM | POA: Diagnosis not present

## 2016-08-15 DIAGNOSIS — N186 End stage renal disease: Secondary | ICD-10-CM | POA: Diagnosis not present

## 2016-08-15 DIAGNOSIS — N2581 Secondary hyperparathyroidism of renal origin: Secondary | ICD-10-CM | POA: Diagnosis not present

## 2016-08-17 DIAGNOSIS — N186 End stage renal disease: Secondary | ICD-10-CM | POA: Diagnosis not present

## 2016-08-17 DIAGNOSIS — N2581 Secondary hyperparathyroidism of renal origin: Secondary | ICD-10-CM | POA: Diagnosis not present

## 2016-08-19 DIAGNOSIS — N2581 Secondary hyperparathyroidism of renal origin: Secondary | ICD-10-CM | POA: Diagnosis not present

## 2016-08-19 DIAGNOSIS — N186 End stage renal disease: Secondary | ICD-10-CM | POA: Diagnosis not present

## 2016-08-22 DIAGNOSIS — N186 End stage renal disease: Secondary | ICD-10-CM | POA: Diagnosis not present

## 2016-08-22 DIAGNOSIS — N2581 Secondary hyperparathyroidism of renal origin: Secondary | ICD-10-CM | POA: Diagnosis not present

## 2016-08-23 ENCOUNTER — Encounter: Payer: Self-pay | Admitting: Vascular Surgery

## 2016-08-24 DIAGNOSIS — N2581 Secondary hyperparathyroidism of renal origin: Secondary | ICD-10-CM | POA: Diagnosis not present

## 2016-08-24 DIAGNOSIS — N186 End stage renal disease: Secondary | ICD-10-CM | POA: Diagnosis not present

## 2016-08-27 DIAGNOSIS — N2581 Secondary hyperparathyroidism of renal origin: Secondary | ICD-10-CM | POA: Diagnosis not present

## 2016-08-27 DIAGNOSIS — N186 End stage renal disease: Secondary | ICD-10-CM | POA: Diagnosis not present

## 2016-08-29 DIAGNOSIS — Q612 Polycystic kidney, adult type: Secondary | ICD-10-CM | POA: Diagnosis not present

## 2016-08-29 DIAGNOSIS — N2581 Secondary hyperparathyroidism of renal origin: Secondary | ICD-10-CM | POA: Diagnosis not present

## 2016-08-29 DIAGNOSIS — Z992 Dependence on renal dialysis: Secondary | ICD-10-CM | POA: Diagnosis not present

## 2016-08-29 DIAGNOSIS — N186 End stage renal disease: Secondary | ICD-10-CM | POA: Diagnosis not present

## 2016-08-30 ENCOUNTER — Ambulatory Visit (INDEPENDENT_AMBULATORY_CARE_PROVIDER_SITE_OTHER): Payer: Medicare Other | Admitting: Vascular Surgery

## 2016-08-30 ENCOUNTER — Encounter: Payer: Self-pay | Admitting: Vascular Surgery

## 2016-08-30 ENCOUNTER — Other Ambulatory Visit: Payer: Self-pay

## 2016-08-30 VITALS — BP 124/80 | HR 82 | Temp 98.5°F | Resp 16 | Ht 66.0 in | Wt 188.0 lb

## 2016-08-30 DIAGNOSIS — N186 End stage renal disease: Secondary | ICD-10-CM

## 2016-08-30 DIAGNOSIS — Z992 Dependence on renal dialysis: Secondary | ICD-10-CM

## 2016-08-30 NOTE — Progress Notes (Signed)
Vascular and Vein Specialist of [facility]  Patient name: Michael [name] [name]: [id number] DOB: [birth year] Sex: male  REASON FOR CONSULT: Evaluation of left upper arm AV fistula  HPI: Michael [name] is a 61 y.o. male, who is here today for evaluation of left upper arm AV fistula. Had creation of a left upper arm fistula by Dr. Bridgett Larsson in September 2013. He had initiation of dialysis in 2014 and is use this fistula since that period of time. He has had excellent use of this. He is seen today for concern regarding continued aneurysmal degeneration and thinning of the skin overlying this. He has had no bleeding from his fistula.  Past Medical History:  Diagnosis Date  . ESRD (end stage renal disease) on dialysis [facility])    Started dialysis Feb 2014, gets HD MWF at [facility]. Saw Dr Moshe Cipro prior to starting HD.  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 All except for one of his father's siblings had dialysis.  Has one brother with renal transplant, esrd also due to cystic kidney disease.    . Fatigue   . Hyperlipidemia   . Hyperparathyroidism, secondary renal ([place])   . Hypertension   . Hypoxemia 12/12/2013  . Kidney stones    pased one, [place]   . Nonischemic cardiomyopathy ([place])    Er 25% 2015, 55 % 2013  . OSA on CPAP    uses cpap setting of 4  . OSA on CPAP 03/24/2014  . Polycystic kidney disease, autosomal dominant   . Ventricular tachycardia//Freq PVCs     Family History  Problem Relation Age of Onset  . Heart disease Mother   . Hyperlipidemia Mother   . Hypertension Mother   . Kidney disease Father   . Stroke Father   . Kidney disease Brother     SOCIAL HISTORY: Social History   Social History  . Marital status: Married    Spouse name: [name]  . Number of children: 3  . Years of education: 12   Occupational History  .      disabled   Social History Main Topics  . Smoking status: Never Smoker  .  Smokeless tobacco: Never Used  . Alcohol use No  . Drug use: No  . Sexual activity: Yes   Other Topics Concern  . Not on file   Social History Narrative   Patient is married [place]) and lives at home with his wife and children.   Patient has three children.   Patient is in disability.   Patient has a high school education.   Patient is right-handed   Patient drinks very little soda.                No Known Allergies  Current Outpatient Prescriptions  Medication Sig Dispense Refill  . aspirin EC 81 MG EC tablet Take 1 tablet (81 mg total) by mouth daily.    [name] atorvastatin (LIPITOR) 40 MG tablet Take 40 mg by mouth every evening.     . calcium acetate (PHOSLO) 667 MG capsule Take 667 mg by mouth 3 (three) times daily.  6  . metoprolol succinate (TOPROL-XL) 100 MG 24 hr tablet Take 100 mg by mouth daily. Take with or immediately following a meal.    . nitroGLYCERIN (NITROSTAT) 0.4 MG SL tablet Place 1 tablet (0.4 mg total) under the tongue every 5 (five) minutes x 3 doses as needed for chest pain. 30 tablet 12  .  SENSIPAR 30 MG tablet Take 30 mg by mouth every evening.   5   No current facility-administered medications for this visit.     REVIEW OF SYSTEMS:  [X]  denotes positive finding, [ ]  denotes negative finding Cardiac  Comments:  Chest pain or chest pressure:    Shortness of breath upon exertion: x   Short of breath when lying flat: x   Irregular heart rhythm:        Vascular    Pain in calf, thigh, or hip brought on by ambulation:    Pain in feet at night that wakes you up from your sleep:     Blood clot in your veins:    Leg swelling:         Pulmonary    Oxygen at home:    Productive cough:     Wheezing:         Neurologic    Sudden weakness in arms or legs:     Sudden numbness in arms or legs:     Sudden onset of difficulty speaking or slurred speech:    Temporary loss of vision in one eye:     Problems with dizziness:         Gastrointestinal      Blood in stool:     Vomited blood:         Genitourinary    Burning when urinating:     Blood in urine:        Psychiatric    Major depression:         Hematologic    Bleeding problems:    Problems with blood clotting too easily:        Skin    Rashes or ulcers:        Constitutional    Fever or chills:      PHYSICAL EXAM: Vitals:   08/30/16 1400  BP: 124/80  Pulse: 82  Resp: 16  Temp: 98.5 F (36.9 C)  TempSrc: Oral  SpO2: 95%  Weight: 188 lb (85.3 kg)  Height: 5\' 6"  (1.676 m)    GENERAL: The patient is a well-nourished male, in no acute distress. The vital signs are documented above. CARDIOVASCULAR: Palpable radial pulses bilaterally. Left upper arm fistula. He does have marked tortuosity of the vein above the area being access. He has a large aneurysmal change in the vein just above the antecubital space and this is where all the access is being done due to the tortuosity above this. This skin is thinned over this and is also firm and adherent to the vein itself PULMONARY: There is good air exchange  ABDOMEN: Soft and non-tender  MUSCULOSKELETAL: There are no major deformities or cyanosis. NEUROLOGIC: No focal weakness or paresthesias are detected. SKIN: There are no ulcers or rashes noted. PSYCHIATRIC: The patient has a normal affect.    MEDICAL ISSUES: Long term successful access regarding left upper arm AV fistula. I am quite concerned regarding his skin changes. He has marked aneurysmal change in this and also has adherence of his skin to the fistula vein itself. He has a great deal tortuosity above the area of access making it difficult to rotate the needle site. I have recommended revision of his fistula. Feel most appropriate to mobilize the entire length of the cephalic vein and transpose this around the current aneurysm by resecting this area with an end-to-end anastomosis. This would allow used for the entire length. He does not have very large surface  veins on the right arm feel that he has limited chance for a new fistula on that side. He does understand that he would require catheter per approximately 2 months until the translocation and healed. We will coordinate this at his earliest convenience. He requests this be done in June due to upcoming events that he needs to attend to in May. I feel that this is appropriate since he has not had any bleeding.   [name], MD FACS Vascular and Vein Specialists of [facility] Tel [phone] Pager [phone]

## 2016-08-31 DIAGNOSIS — N186 End stage renal disease: Secondary | ICD-10-CM | POA: Diagnosis not present

## 2016-08-31 DIAGNOSIS — N2581 Secondary hyperparathyroidism of renal origin: Secondary | ICD-10-CM | POA: Diagnosis not present

## 2016-09-02 DIAGNOSIS — N186 End stage renal disease: Secondary | ICD-10-CM | POA: Diagnosis not present

## 2016-09-02 DIAGNOSIS — N2581 Secondary hyperparathyroidism of renal origin: Secondary | ICD-10-CM | POA: Diagnosis not present

## 2016-09-05 DIAGNOSIS — N186 End stage renal disease: Secondary | ICD-10-CM | POA: Diagnosis not present

## 2016-09-05 DIAGNOSIS — N2581 Secondary hyperparathyroidism of renal origin: Secondary | ICD-10-CM | POA: Diagnosis not present

## 2016-09-07 DIAGNOSIS — N2581 Secondary hyperparathyroidism of renal origin: Secondary | ICD-10-CM | POA: Diagnosis not present

## 2016-09-07 DIAGNOSIS — N186 End stage renal disease: Secondary | ICD-10-CM | POA: Diagnosis not present

## 2016-09-09 DIAGNOSIS — N186 End stage renal disease: Secondary | ICD-10-CM | POA: Diagnosis not present

## 2016-09-09 DIAGNOSIS — N2581 Secondary hyperparathyroidism of renal origin: Secondary | ICD-10-CM | POA: Diagnosis not present

## 2016-09-12 DIAGNOSIS — N186 End stage renal disease: Secondary | ICD-10-CM | POA: Diagnosis not present

## 2016-09-12 DIAGNOSIS — N2581 Secondary hyperparathyroidism of renal origin: Secondary | ICD-10-CM | POA: Diagnosis not present

## 2016-09-14 DIAGNOSIS — N186 End stage renal disease: Secondary | ICD-10-CM | POA: Diagnosis not present

## 2016-09-14 DIAGNOSIS — N2581 Secondary hyperparathyroidism of renal origin: Secondary | ICD-10-CM | POA: Diagnosis not present

## 2016-09-16 DIAGNOSIS — N2581 Secondary hyperparathyroidism of renal origin: Secondary | ICD-10-CM | POA: Diagnosis not present

## 2016-09-16 DIAGNOSIS — N186 End stage renal disease: Secondary | ICD-10-CM | POA: Diagnosis not present

## 2016-09-19 DIAGNOSIS — N186 End stage renal disease: Secondary | ICD-10-CM | POA: Diagnosis not present

## 2016-09-19 DIAGNOSIS — N2581 Secondary hyperparathyroidism of renal origin: Secondary | ICD-10-CM | POA: Diagnosis not present

## 2016-09-20 DIAGNOSIS — N5201 Erectile dysfunction due to arterial insufficiency: Secondary | ICD-10-CM | POA: Diagnosis not present

## 2016-09-20 DIAGNOSIS — Q613 Polycystic kidney, unspecified: Secondary | ICD-10-CM | POA: Diagnosis not present

## 2016-09-20 DIAGNOSIS — R801 Persistent proteinuria, unspecified: Secondary | ICD-10-CM | POA: Diagnosis not present

## 2016-09-20 DIAGNOSIS — I10 Essential (primary) hypertension: Secondary | ICD-10-CM | POA: Diagnosis not present

## 2016-09-20 DIAGNOSIS — N186 End stage renal disease: Secondary | ICD-10-CM | POA: Diagnosis not present

## 2016-09-21 DIAGNOSIS — N2581 Secondary hyperparathyroidism of renal origin: Secondary | ICD-10-CM | POA: Diagnosis not present

## 2016-09-21 DIAGNOSIS — N186 End stage renal disease: Secondary | ICD-10-CM | POA: Diagnosis not present

## 2016-09-23 DIAGNOSIS — N186 End stage renal disease: Secondary | ICD-10-CM | POA: Diagnosis not present

## 2016-09-23 DIAGNOSIS — N2581 Secondary hyperparathyroidism of renal origin: Secondary | ICD-10-CM | POA: Diagnosis not present

## 2016-09-26 DIAGNOSIS — N2581 Secondary hyperparathyroidism of renal origin: Secondary | ICD-10-CM | POA: Diagnosis not present

## 2016-09-26 DIAGNOSIS — N186 End stage renal disease: Secondary | ICD-10-CM | POA: Diagnosis not present

## 2016-09-28 DIAGNOSIS — N2581 Secondary hyperparathyroidism of renal origin: Secondary | ICD-10-CM | POA: Diagnosis not present

## 2016-09-28 DIAGNOSIS — N186 End stage renal disease: Secondary | ICD-10-CM | POA: Diagnosis not present

## 2016-09-29 DIAGNOSIS — N186 End stage renal disease: Secondary | ICD-10-CM | POA: Diagnosis not present

## 2016-09-29 DIAGNOSIS — Q612 Polycystic kidney, adult type: Secondary | ICD-10-CM | POA: Diagnosis not present

## 2016-09-29 DIAGNOSIS — Z992 Dependence on renal dialysis: Secondary | ICD-10-CM | POA: Diagnosis not present

## 2016-09-29 DIAGNOSIS — L820 Inflamed seborrheic keratosis: Secondary | ICD-10-CM | POA: Diagnosis not present

## 2016-09-30 DIAGNOSIS — N2581 Secondary hyperparathyroidism of renal origin: Secondary | ICD-10-CM | POA: Diagnosis not present

## 2016-09-30 DIAGNOSIS — N186 End stage renal disease: Secondary | ICD-10-CM | POA: Diagnosis not present

## 2016-10-03 DIAGNOSIS — N186 End stage renal disease: Secondary | ICD-10-CM | POA: Diagnosis not present

## 2016-10-03 DIAGNOSIS — N2581 Secondary hyperparathyroidism of renal origin: Secondary | ICD-10-CM | POA: Diagnosis not present

## 2016-10-04 ENCOUNTER — Encounter (HOSPITAL_COMMUNITY): Payer: Self-pay | Admitting: *Deleted

## 2016-10-04 NOTE — Progress Notes (Signed)
Pt denies SOB, chest pain, and being under the care of a cardiologist. Pt stated that an EKG was performed at PCP's office, Dr. [name]; records requested. Pt made aware to stop taking vitamins, fish oil and herbal medications. Do not take any NSAIDs ie: Ibuprofen, Advil, Naproxen BC and Goody Powder. Pt verbalized understanding of all pre-op instructions.

## 2016-10-04 NOTE — Anesthesia Preprocedure Evaluation (Addendum)
Anesthesia Evaluation  Patient identified by [name], date of birth, ID band Patient awake    Reviewed: Allergy & Precautions, H&P , NPO status , Patient's Chart, lab work & pertinent test results, reviewed documented beta blocker date and time   Airway Mallampati: III  TM Distance: >3 FB Neck ROM: Full    Dental no notable dental hx. (+) Teeth Intact, Dental Advisory Given   Pulmonary sleep apnea ,    Pulmonary exam normal breath sounds clear to auscultation       Cardiovascular Exercise Tolerance: Good hypertension, Pt. on medications and Pt. on home beta blockers + dysrhythmias  Rhythm:Regular Rate:Normal     Neuro/Psych negative neurological ROS  negative psych ROS   GI/Hepatic negative GI ROS, Neg liver ROS,   Endo/Other  negative endocrine ROS  Renal/GU ESRF and DialysisRenal disease  negative genitourinary   Musculoskeletal   Abdominal   Peds  Hematology negative hematology ROS (+)   Anesthesia Other Findings   Reproductive/Obstetrics negative OB ROS                            Anesthesia Physical Anesthesia Plan  ASA: III  Anesthesia Plan: MAC   Post-op Pain Management:    Induction: Intravenous  PONV Risk Score and Plan: 2 and Ondansetron, Dexamethasone, Propofol and Midazolam  Airway Management Planned: Simple Face Mask  Additional Equipment:   Intra-op Plan:   Post-operative Plan:   Informed Consent: I have reviewed the patients History and Physical, chart, labs and discussed the procedure including the risks, benefits and alternatives for the proposed anesthesia with the patient or authorized representative who has indicated his/her understanding and acceptance.   Dental advisory given  Plan Discussed with: CRNA  Anesthesia Plan Comments:        Anesthesia Quick Evaluation

## 2016-10-05 ENCOUNTER — Ambulatory Visit (HOSPITAL_COMMUNITY): Payer: Medicare Other | Admitting: Anesthesiology

## 2016-10-05 ENCOUNTER — Ambulatory Visit (HOSPITAL_COMMUNITY)
Admission: RE | Admit: 2016-10-05 | Discharge: 2016-10-05 | Disposition: A | Discharge status: Home / Self Care | Payer: Medicare Other | Source: Ambulatory Visit | Attending: Vascular Surgery | Admitting: Vascular Surgery

## 2016-10-05 ENCOUNTER — Ambulatory Visit (HOSPITAL_COMMUNITY): Payer: Medicare Other

## 2016-10-05 ENCOUNTER — Encounter (HOSPITAL_COMMUNITY): Payer: Self-pay | Admitting: *Deleted

## 2016-10-05 ENCOUNTER — Encounter (HOSPITAL_COMMUNITY)
Admission: RE | Disposition: A | Discharge status: Home / Self Care | Payer: Self-pay | Source: Ambulatory Visit | Attending: Vascular Surgery

## 2016-10-05 DIAGNOSIS — Z79899 Other long term (current) drug therapy: Secondary | ICD-10-CM | POA: Insufficient documentation

## 2016-10-05 DIAGNOSIS — E785 Hyperlipidemia, unspecified: Secondary | ICD-10-CM | POA: Diagnosis not present

## 2016-10-05 DIAGNOSIS — N186 End stage renal disease: Secondary | ICD-10-CM | POA: Insufficient documentation

## 2016-10-05 DIAGNOSIS — Y832 Surgical operation with anastomosis, bypass or graft as the cause of abnormal reaction of the patient, or of later complication, without mention of misadventure at the time of the procedure: Secondary | ICD-10-CM | POA: Insufficient documentation

## 2016-10-05 DIAGNOSIS — Z95828 Presence of other vascular implants and grafts: Secondary | ICD-10-CM

## 2016-10-05 DIAGNOSIS — Z87442 Personal history of urinary calculi: Secondary | ICD-10-CM | POA: Diagnosis not present

## 2016-10-05 DIAGNOSIS — T82590A Other mechanical complication of surgically created arteriovenous fistula, initial encounter: Secondary | ICD-10-CM | POA: Diagnosis not present

## 2016-10-05 DIAGNOSIS — T82898A Other specified complication of vascular prosthetic devices, implants and grafts, initial encounter: Secondary | ICD-10-CM | POA: Diagnosis not present

## 2016-10-05 DIAGNOSIS — Z841 Family history of disorders of kidney and ureter: Secondary | ICD-10-CM | POA: Diagnosis not present

## 2016-10-05 DIAGNOSIS — Z419 Encounter for procedure for purposes other than remedying health state, unspecified: Secondary | ICD-10-CM

## 2016-10-05 DIAGNOSIS — Z992 Dependence on renal dialysis: Secondary | ICD-10-CM | POA: Diagnosis not present

## 2016-10-05 DIAGNOSIS — Z4682 Encounter for fitting and adjustment of non-vascular catheter: Secondary | ICD-10-CM | POA: Diagnosis not present

## 2016-10-05 DIAGNOSIS — I429 Cardiomyopathy, unspecified: Secondary | ICD-10-CM | POA: Diagnosis not present

## 2016-10-05 DIAGNOSIS — I729 Aneurysm of unspecified site: Secondary | ICD-10-CM | POA: Diagnosis not present

## 2016-10-05 DIAGNOSIS — Z7982 Long term (current) use of aspirin: Secondary | ICD-10-CM | POA: Insufficient documentation

## 2016-10-05 DIAGNOSIS — Q612 Polycystic kidney, adult type: Secondary | ICD-10-CM | POA: Diagnosis not present

## 2016-10-05 DIAGNOSIS — G4733 Obstructive sleep apnea (adult) (pediatric): Secondary | ICD-10-CM | POA: Insufficient documentation

## 2016-10-05 DIAGNOSIS — I120 Hypertensive chronic kidney disease with stage 5 chronic kidney disease or end stage renal disease: Secondary | ICD-10-CM | POA: Insufficient documentation

## 2016-10-05 DIAGNOSIS — Z9989 Dependence on other enabling machines and devices: Secondary | ICD-10-CM | POA: Insufficient documentation

## 2016-10-05 HISTORY — PX: REVISON OF ARTERIOVENOUS FISTULA: SHX6074

## 2016-10-05 HISTORY — PX: INSERTION OF DIALYSIS CATHETER: SHX1324

## 2016-10-05 LAB — POCT I-STAT 4, (NA,K, GLUC, HGB,HCT)
Glucose, Bld: 88 mg/dL (ref 65–99)
HCT: 46 % (ref 39.0–52.0)
Hemoglobin: 15.6 g/dL (ref 13.0–17.0)
Potassium: 4 mmol/L (ref 3.5–5.1)
Sodium: 139 mmol/L (ref 135–145)

## 2016-10-05 IMAGING — DX DG CHEST 1V PORT
1 series · 1 of 1 positions shown · non-contrast
Comparison: [DATE]

CLINICAL DATA: Dialysis catheter placement

EXAM:
PORTABLE CHEST 1 VIEW

[chest]
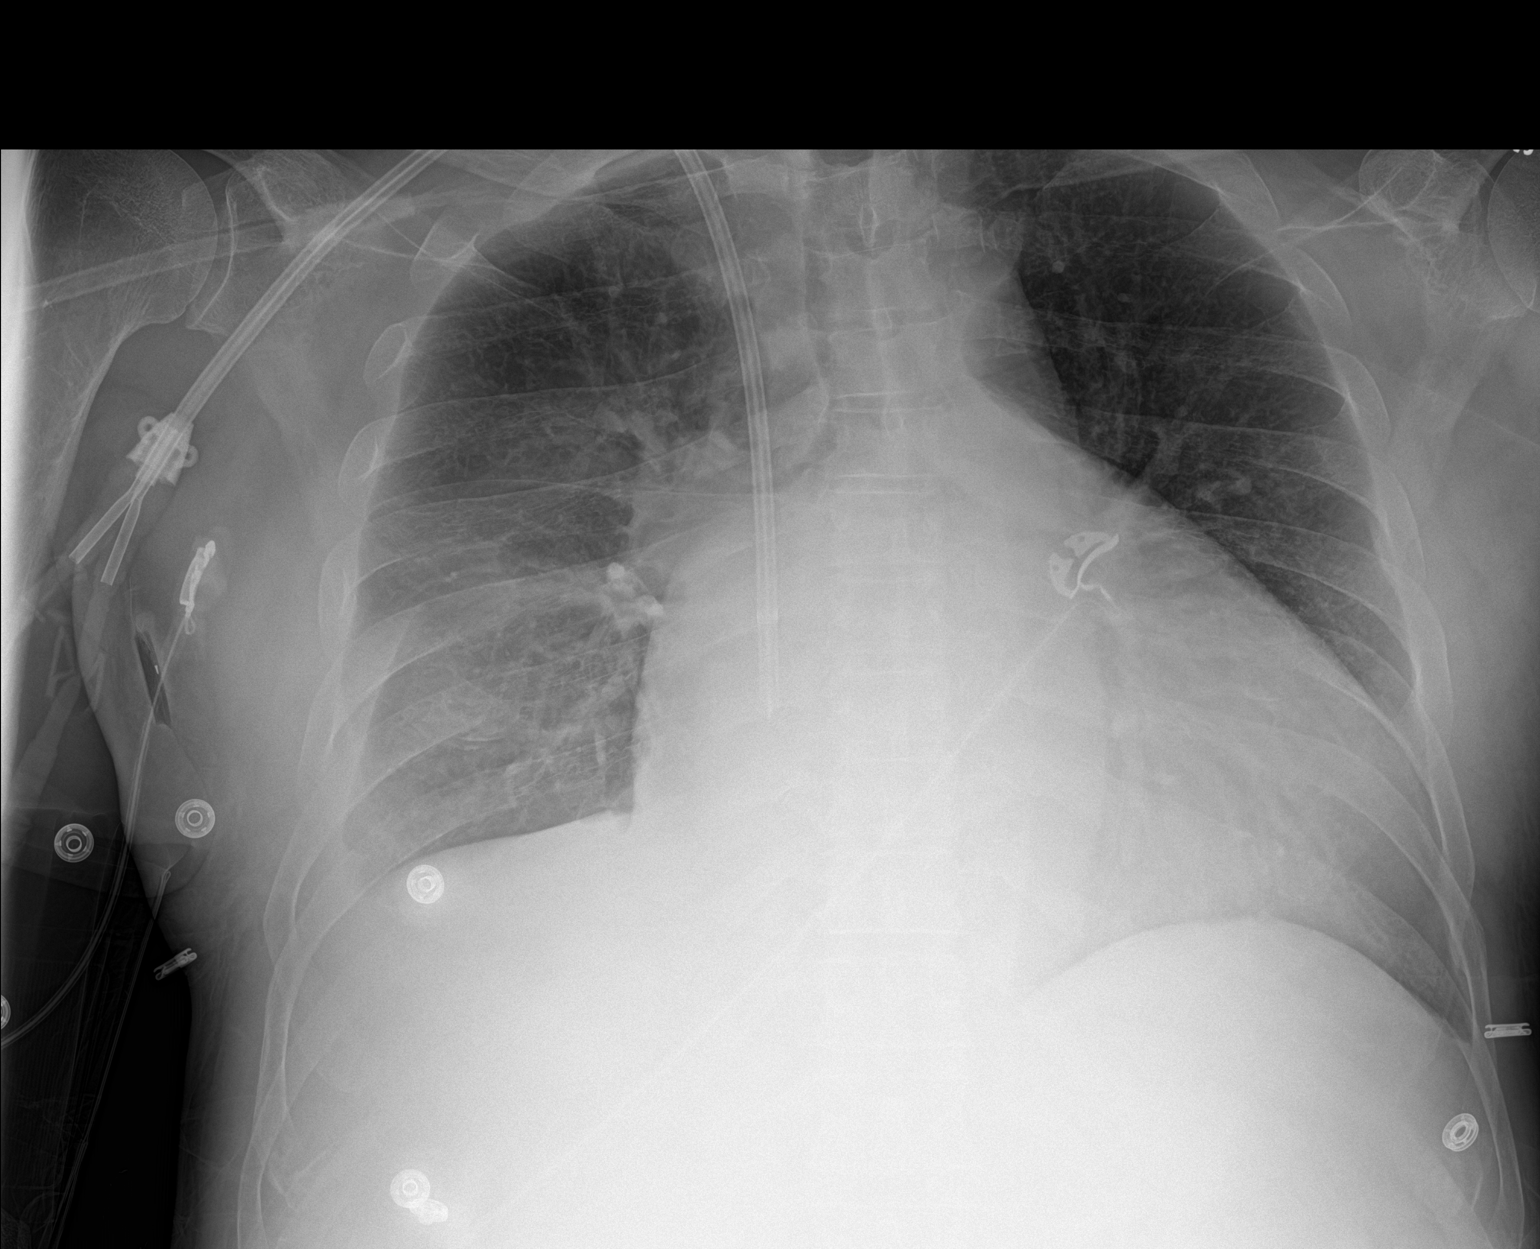

[1 of 1 positions shown; findings below may reference images not displayed]

FINDINGS: Central catheter tip is at the cavoatrial junction. No pneumothorax.
There is a small loculated effusion on the right. There is no edema
or consolidation. There is cardiomegaly with mild pulmonary venous
hypertension. No adenopathy. No bone lesions.
IMPRESSION: Central catheter tip at cavoatrial junction. No pneumothorax.
Pulmonary vascular congestion without edema or consolidation. Small
loculated effusion on the right.

## 2016-10-05 SURGERY — REVISON OF ARTERIOVENOUS FISTULA
Anesthesia: General | Site: Neck | Laterality: Right

## 2016-10-05 MED ORDER — PHENYLEPHRINE 40 MCG/ML (10ML) SYRINGE FOR IV PUSH (FOR BLOOD PRESSURE SUPPORT)
PREFILLED_SYRINGE | INTRAVENOUS | Status: AC
  Filled 2016-10-05: qty 10

## 2016-10-05 MED ORDER — HEPARIN SODIUM (PORCINE) 1000 UNIT/ML IJ SOLN
INTRAMUSCULAR | Status: DC | PRN
  Administered 2016-10-05: 5000 [IU] via INTRAVENOUS

## 2016-10-05 MED ORDER — PROPOFOL 10 MG/ML IV BOLUS
INTRAVENOUS | Status: DC | PRN
  Administered 2016-10-05: 50 mg via INTRAVENOUS
  Administered 2016-10-05: 20 mg via INTRAVENOUS

## 2016-10-05 MED ORDER — 0.9 % SODIUM CHLORIDE (POUR BTL) OPTIME
TOPICAL | Status: DC | PRN
  Administered 2016-10-05: 1000 mL

## 2016-10-05 MED ORDER — MIDAZOLAM HCL 5 MG/5ML IJ SOLN
INTRAMUSCULAR | Status: DC | PRN
  Administered 2016-10-05: 2 mg via INTRAVENOUS

## 2016-10-05 MED ORDER — ONDANSETRON HCL 4 MG/2ML IJ SOLN
INTRAMUSCULAR | Status: DC | PRN
  Administered 2016-10-05: 4 mg via INTRAVENOUS

## 2016-10-05 MED ORDER — LIDOCAINE-EPINEPHRINE (PF) 1 %-1:200000 IJ SOLN
INTRAMUSCULAR | Status: AC
  Filled 2016-10-05: qty 30

## 2016-10-05 MED ORDER — DEXAMETHASONE SODIUM PHOSPHATE 10 MG/ML IJ SOLN
INTRAMUSCULAR | Status: DC | PRN
  Administered 2016-10-05: 10 mg via INTRAVENOUS

## 2016-10-05 MED ORDER — FENTANYL CITRATE (PF) 100 MCG/2ML IJ SOLN: 25.0000 ug | INTRAMUSCULAR | Status: DC | PRN

## 2016-10-05 MED ORDER — DEXTROSE 5 % IV SOLN
1.5000 g | INTRAVENOUS | Status: AC
  Administered 2016-10-05: 1.5 g via INTRAVENOUS
  Filled 2016-10-05: qty 1.5

## 2016-10-05 MED ORDER — OXYCODONE-ACETAMINOPHEN 5-325 MG PO TABS: 1.0000 | ORAL_TABLET | Freq: Four times a day (QID) | ORAL | 0 refills | Status: DC | PRN

## 2016-10-05 MED ORDER — DEXAMETHASONE SODIUM PHOSPHATE 10 MG/ML IJ SOLN
INTRAMUSCULAR | Status: AC
  Filled 2016-10-05: qty 1

## 2016-10-05 MED ORDER — SODIUM CHLORIDE 0.9 % IV SOLN
INTRAVENOUS | Status: DC
  Administered 2016-10-05: 08:00:00 via INTRAVENOUS

## 2016-10-05 MED ORDER — CHLORHEXIDINE GLUCONATE CLOTH 2 % EX PADS: 6.0000 | MEDICATED_PAD | Freq: Once | CUTANEOUS | Status: DC

## 2016-10-05 MED ORDER — FENTANYL CITRATE (PF) 250 MCG/5ML IJ SOLN
INTRAMUSCULAR | Status: AC
  Filled 2016-10-05: qty 5

## 2016-10-05 MED ORDER — PROPOFOL 10 MG/ML IV BOLUS
INTRAVENOUS | Status: AC
  Filled 2016-10-05: qty 20

## 2016-10-05 MED ORDER — FENTANYL CITRATE (PF) 100 MCG/2ML IJ SOLN
INTRAMUSCULAR | Status: DC | PRN
  Administered 2016-10-05: 50 ug via INTRAVENOUS

## 2016-10-05 MED ORDER — MIDAZOLAM HCL 2 MG/2ML IJ SOLN
INTRAMUSCULAR | Status: AC
  Filled 2016-10-05: qty 2

## 2016-10-05 MED ORDER — PROPOFOL 500 MG/50ML IV EMUL
INTRAVENOUS | Status: DC | PRN
  Administered 2016-10-05: 100 ug/kg/min via INTRAVENOUS

## 2016-10-05 MED ORDER — PHENYLEPHRINE HCL 10 MG/ML IJ SOLN
INTRAVENOUS | Status: DC | PRN
  Administered 2016-10-05: 35 ug/min via INTRAVENOUS

## 2016-10-05 MED ORDER — HEPARIN SODIUM (PORCINE) 1000 UNIT/ML IJ SOLN
INTRAMUSCULAR | Status: AC
  Filled 2016-10-05: qty 1

## 2016-10-05 MED ORDER — SODIUM CHLORIDE 0.9 % IV SOLN
INTRAVENOUS | Status: DC | PRN
  Administered 2016-10-05: 500 mL

## 2016-10-05 MED ORDER — PROTAMINE SULFATE 10 MG/ML IV SOLN
INTRAVENOUS | Status: DC | PRN
  Administered 2016-10-05: 10 mg via INTRAVENOUS
  Administered 2016-10-05: 40 mg via INTRAVENOUS

## 2016-10-05 MED ORDER — HEPARIN SODIUM (PORCINE) 1000 UNIT/ML IJ SOLN
INTRAMUSCULAR | Status: DC | PRN
  Administered 2016-10-05: 3.4 [IU]

## 2016-10-05 MED ORDER — ONDANSETRON HCL 4 MG/2ML IJ SOLN
INTRAMUSCULAR | Status: AC
  Filled 2016-10-05: qty 2

## 2016-10-05 MED ORDER — LIDOCAINE-EPINEPHRINE (PF) 1 %-1:200000 IJ SOLN
INTRAMUSCULAR | Status: DC | PRN
  Administered 2016-10-05: 15 mL via INTRADERMAL

## 2016-10-05 SURGICAL SUPPLY — 61 items
ADH SKN CLS APL DERMABOND .7 (GAUZE/BANDAGES/DRESSINGS) ×2
ADH SKN CLS LQ APL DERMABOND (GAUZE/BANDAGES/DRESSINGS) ×4
ARMBAND PINK RESTRICT EXTREMIT (MISCELLANEOUS) ×3 IMPLANT
BAG DECANTER FOR FLEXI CONT (MISCELLANEOUS) ×2 IMPLANT
BANDAGE ACE 4X5 VEL STRL LF (GAUZE/BANDAGES/DRESSINGS) ×1 IMPLANT
BANDAGE ESMARK 6X9 LF (GAUZE/BANDAGES/DRESSINGS) IMPLANT
BIOPATCH RED 1 DISK 7.0 (GAUZE/BANDAGES/DRESSINGS) ×3 IMPLANT
BNDG CMPR 9X6 STRL LF SNTH (GAUZE/BANDAGES/DRESSINGS) ×2
BNDG ESMARK 6X9 LF (GAUZE/BANDAGES/DRESSINGS) ×3
BNDG GAUZE ELAST 4 BULKY (GAUZE/BANDAGES/DRESSINGS) ×1 IMPLANT
CANISTER SUCT 3000ML PPV (MISCELLANEOUS) ×3 IMPLANT
CANNULA VESSEL 3MM 2 BLNT TIP (CANNULA) ×3 IMPLANT
CATH PALINDROME RT-P 15FX19CM (CATHETERS) IMPLANT
CATH PALINDROME RT-P 15FX23CM (CATHETERS) ×1 IMPLANT
CATH PALINDROME RT-P 15FX28CM (CATHETERS) IMPLANT
CATH PALINDROME RT-P 15FX55CM (CATHETERS) IMPLANT
CLIP LIGATING EXTRA MED SLVR (CLIP) ×3 IMPLANT
CLIP LIGATING EXTRA SM BLUE (MISCELLANEOUS) ×3 IMPLANT
COVER PROBE W GEL 5X96 (DRAPES) ×3 IMPLANT
COVER SURGICAL LIGHT HANDLE (MISCELLANEOUS) ×2 IMPLANT
CUFF TOURNIQUET SINGLE 18IN (TOURNIQUET CUFF) ×1 IMPLANT
DECANTER SPIKE VIAL GLASS SM (MISCELLANEOUS) ×3 IMPLANT
DERMABOND ADHESIVE PROPEN (GAUZE/BANDAGES/DRESSINGS) ×2
DERMABOND ADVANCED (GAUZE/BANDAGES/DRESSINGS) ×1
DERMABOND ADVANCED .7 DNX12 (GAUZE/BANDAGES/DRESSINGS) ×2 IMPLANT
DERMABOND ADVANCED .7 DNX6 (GAUZE/BANDAGES/DRESSINGS) IMPLANT
DRAPE C-ARM 42X72 X-RAY (DRAPES) ×3 IMPLANT
DRAPE CHEST BREAST 15X10 FENES (DRAPES) ×3 IMPLANT
ELECT REM PT RETURN 9FT ADLT (ELECTROSURGICAL) ×3
ELECTRODE REM PT RTRN 9FT ADLT (ELECTROSURGICAL) ×2 IMPLANT
GAUZE SPONGE 4X4 12PLY STRL (GAUZE/BANDAGES/DRESSINGS) ×1 IMPLANT
GLOVE SS BIOGEL STRL SZ 7.5 (GLOVE) ×2 IMPLANT
GLOVE SUPERSENSE BIOGEL SZ 7.5 (GLOVE) ×2
GOWN STRL REUS W/ TWL LRG LVL3 (GOWN DISPOSABLE) ×6 IMPLANT
GOWN STRL REUS W/TWL LRG LVL3 (GOWN DISPOSABLE) ×12
KIT BASIN OR (CUSTOM PROCEDURE TRAY) ×3 IMPLANT
KIT ROOM TURNOVER OR (KITS) ×3 IMPLANT
NDL 18GX1X1/2 (RX/OR ONLY) (NEEDLE) ×2 IMPLANT
NDL HYPO 25GX1X1/2 BEV (NEEDLE) ×2 IMPLANT
NEEDLE 18GX1X1/2 (RX/OR ONLY) (NEEDLE) ×3 IMPLANT
NEEDLE 22X1 1/2 (OR ONLY) (NEEDLE) IMPLANT
NEEDLE HYPO 25GX1X1/2 BEV (NEEDLE) ×3 IMPLANT
NS IRRIG 1000ML POUR BTL (IV SOLUTION) ×3 IMPLANT
PACK CV ACCESS (CUSTOM PROCEDURE TRAY) ×3 IMPLANT
PACK SURGICAL SETUP 50X90 (CUSTOM PROCEDURE TRAY) ×2 IMPLANT
PAD ARMBOARD 7.5X6 YLW CONV (MISCELLANEOUS) ×6 IMPLANT
SOAP 2 % CHG 4 OZ (WOUND CARE) ×3 IMPLANT
SUT ETHILON 3 0 PS 1 (SUTURE) ×3 IMPLANT
SUT PROLENE 5 0 C 1 24 (SUTURE) ×1 IMPLANT
SUT PROLENE 6 0 CC (SUTURE) ×3 IMPLANT
SUT VIC AB 3-0 SH 27 (SUTURE) ×6
SUT VIC AB 3-0 SH 27X BRD (SUTURE) ×2 IMPLANT
SUT VICRYL 4-0 PS2 18IN ABS (SUTURE) ×3 IMPLANT
SYR 10ML LL (SYRINGE) ×2 IMPLANT
SYR 20CC LL (SYRINGE) ×3 IMPLANT
SYR 5ML LL (SYRINGE) ×6 IMPLANT
SYR CONTROL 10ML LL (SYRINGE) ×3 IMPLANT
TOWEL OR 17X24 6PK STRL BLUE (TOWEL DISPOSABLE) ×3 IMPLANT
TOWEL OR 17X26 10 PK STRL BLUE (TOWEL DISPOSABLE) ×3 IMPLANT
UNDERPAD 30X30 (UNDERPADS AND DIAPERS) ×3 IMPLANT
WATER STERILE IRR 1000ML POUR (IV SOLUTION) ×3 IMPLANT

## 2016-10-05 NOTE — Transfer of Care (Signed)
Immediate Anesthesia Transfer of Care Note  Patient: Michael [name]  Procedure(s) Performed: Procedure(s): REVISON OF left arm ARTERIOVENOUS FISTULA (Left) INSERTION OF right internal jugular DIALYSIS CATHETER (Right)  Patient Location: PACU  Anesthesia Type:General  Level of Consciousness: drowsy  Airway & Oxygen Therapy: Patient Spontanous Breathing and Patient connected to face mask oxygen  Post-op Assessment: Report given to RN, Post -op Vital signs reviewed and stable and Patient moving all extremities  Post vital signs: Reviewed and stable  Last Vitals:  Vitals:   10/05/16 0653  BP: 114/75  Pulse: 67  Resp: 20  Temp: 36.4 C    Last Pain:  Vitals:   10/05/16 0653  TempSrc: Oral      Patients Stated Pain Goal: 1 (36/92/23 0097)  Complications: No apparent anesthesia complications

## 2016-10-05 NOTE — Anesthesia Procedure Notes (Signed)
Procedure Name: MAC Date/Time: 10/05/2016 8:30 AM Performed by: Roderic Palau Pre-anesthesia Checklist: Patient identified, Emergency Drugs available, Suction available and Patient being monitored Oxygen Delivery Method: Simple face mask Placement Confirmation: positive ETCO2

## 2016-10-05 NOTE — Anesthesia Postprocedure Evaluation (Signed)
Anesthesia Post Note  Patient: Michael [name]  Procedure(s) Performed: Procedure(s) (LRB): REVISON OF left arm ARTERIOVENOUS FISTULA (Left) INSERTION OF right internal jugular DIALYSIS CATHETER (Right)     Patient location during evaluation: PACU Anesthesia Type: General Level of consciousness: awake and alert Pain management: pain level controlled Vital Signs Assessment: post-procedure vital signs reviewed and stable Respiratory status: spontaneous breathing, nonlabored ventilation and respiratory function stable Cardiovascular status: blood pressure returned to baseline and stable Postop Assessment: no signs of nausea or vomiting Anesthetic complications: no    Last Vitals:  Vitals:   10/05/16 1116 10/05/16 1132  BP: 108/84 93/60  Pulse: 61 65  Resp: (!) 22 16  Temp: 36.5 C     Last Pain:  Vitals:   10/05/16 1132  TempSrc:   PainSc: 0-No pain                 Yaeko Fazekas,W. EDMOND

## 2016-10-05 NOTE — Discharge Instructions (Signed)
° ° °  10/05/2016 Michael [name] [id number] 08/01/1955  Surgeon(s): Early, Arvilla Meres, MD  Procedure(s): REVISON OF left arm ARTERIOVENOUS FISTULA INSERTION OF right internal jugular DIALYSIS CATHETER  x Do not stick fistula for 6 weeks

## 2016-10-05 NOTE — H&P (Signed)
Office Visit   08/30/2016 [facility] -Judge Stall, Arvilla Meres, MD  Vascular Surgery   ESRD on dialysis [facility])  Dx   Re-evaluation ; Referred by [name], MD  Reason for Visit   Additional Documentation   Vitals:   BP 124/80 (BP Location: Right Arm, Patient Position: Sitting, Cuff Size: Large)   Pulse 82   Temp 98.5 F (36.9 C) (Oral)   Resp 16   Ht 5\' 6"  (1.676 m)   Wt 188 lb (85.3 kg)   SpO2 95%   BMI 30.34 kg/m   BSA 1.99 m   Flowsheets:   Custom Formula Data,   Vital Signs,   MEWS Score,   Anthropometrics,   Infectious Disease Screening,   Clinical Intake,   Healthcare Directives     Encounter Info:   Billing Info,   History,   Allergies,   Detailed Report     All Notes   Progress Notes by [name], MD at 08/30/2016 2:15 PM   Author: [name], MD Author Type: Physician Filed: 08/30/2016 4:34 PM  Note Status: Signed Cosign: Cosign Not Required Encounter Date: 08/30/2016  Editor: [name], MD (Physician)                                        [facility]ist of [facility]  Patient name: Michael [name]         [name]: [id number]        DOB: [birth year]          Sex: male  REASON FOR CONSULT: Evaluation of left upper arm AV fistula  HPI: Michael [name] is a 61 y.o. male, who is here today for evaluation of left upper arm AV fistula. Had creation of a left upper arm fistula by Dr. Bridgett Larsson in September 2013. He had initiation of dialysis in 2014 and is use this fistula since that period of time. He has had excellent use of this. He is seen today for concern regarding continued aneurysmal degeneration and thinning of the skin overlying this. He has had no bleeding from his fistula.      Past Medical History:  Diagnosis Date  . ESRD (end stage renal disease) on dialysis [facility])    Started dialysis Feb 2014, gets HD MWF at [facility]. Saw Dr Moshe Cipro prior to starting HD.  Cause of ESRD was ADPKD, his father had HD  as is deceased from CVA. All except for one of his father's siblings had dialysis.  Has one brother with renal transplant, esrd also due to cystic kidney disease.    . Fatigue   . Hyperlipidemia   . Hyperparathyroidism, secondary renal ([place])   . Hypertension   . Hypoxemia 12/12/2013  . Kidney stones    pased one, [place]   . Nonischemic cardiomyopathy ([place])    Er 25% 2015, 55 % 2013  . OSA on CPAP    uses cpap setting of 4  . OSA on CPAP 03/24/2014  . Polycystic kidney disease, autosomal dominant   . Ventricular tachycardia//Freq PVCs          Family History  Problem Relation Age of Onset  . Heart disease Mother   . Hyperlipidemia Mother   . Hypertension Mother   . Kidney disease Father   . Stroke Father   . Kidney disease Brother     SOCIAL  HISTORY: Social History        Social History  . Marital status: Married    Spouse name: [name]  . Number of children: 3  . Years of education: 12        Occupational History  .      disabled       Social History Main Topics  . Smoking status: Never Smoker  . Smokeless tobacco: Never Used  . Alcohol use No  . Drug use: No  . Sexual activity: Yes       Other Topics Concern  . Not on file      Social History Narrative   Patient is married [place]) and lives at home with his wife and children.   Patient has three children.   Patient is in disability.   Patient has a high school education.   Patient is right-handed   Patient drinks very little soda.                No Known Allergies        Current Outpatient Prescriptions  Medication Sig Dispense Refill  . aspirin EC 81 MG EC tablet Take 1 tablet (81 mg total) by mouth daily.    [name] atorvastatin (LIPITOR) 40 MG tablet Take 40 mg by mouth every evening.     . calcium acetate (PHOSLO) 667 MG capsule Take 667 mg by mouth 3 (three) times daily.  6  . metoprolol succinate (TOPROL-XL) 100 MG 24 hr tablet Take 100  mg by mouth daily. Take with or immediately following a meal.    . nitroGLYCERIN (NITROSTAT) 0.4 MG SL tablet Place 1 tablet (0.4 mg total) under the tongue every 5 (five) minutes x 3 doses as needed for chest pain. 30 tablet 12  . SENSIPAR 30 MG tablet Take 30 mg by mouth every evening.   5   No current facility-administered medications for this visit.     REVIEW OF SYSTEMS:  [X]  denotes positive finding, [ ]  denotes negative finding Cardiac  Comments:  Chest pain or chest pressure:    Shortness of breath upon exertion: x   Short of breath when lying flat: x   Irregular heart rhythm:        Vascular    Pain in calf, thigh, or hip brought on by ambulation:    Pain in feet at night that wakes you up from your sleep:     Blood clot in your veins:    Leg swelling:         Pulmonary    Oxygen at home:    Productive cough:     Wheezing:         Neurologic    Sudden weakness in arms or legs:     Sudden numbness in arms or legs:     Sudden onset of difficulty speaking or slurred speech:    Temporary loss of vision in one eye:     Problems with dizziness:         Gastrointestinal    Blood in stool:     Vomited blood:         Genitourinary    Burning when urinating:     Blood in urine:        Psychiatric    Major depression:         Hematologic    Bleeding problems:    Problems with blood clotting too easily:        Skin  Rashes or ulcers:        Constitutional    Fever or chills:      PHYSICAL EXAM:    Vitals:   08/30/16 1400  BP: 124/80  Pulse: 82  Resp: 16  Temp: 98.5 F (36.9 C)  TempSrc: Oral  SpO2: 95%  Weight: 188 lb (85.3 kg)  Height: 5\' 6"  (1.676 m)    GENERAL: The patient is a well-nourished male, in no acute distress. The vital signs are documented above. CARDIOVASCULAR: Palpable radial pulses bilaterally. Left upper arm fistula. He does  have marked tortuosity of the vein above the area being access. He has a large aneurysmal change in the vein just above the antecubital space and this is where all the access is being done due to the tortuosity above this. This skin is thinned over this and is also firm and adherent to the vein itself PULMONARY: There is good air exchange  ABDOMEN: Soft and non-tender  MUSCULOSKELETAL: There are no major deformities or cyanosis. NEUROLOGIC: No focal weakness or paresthesias are detected. SKIN: There are no ulcers or rashes noted. PSYCHIATRIC: The patient has a normal affect.    MEDICAL ISSUES: Long term successful access regarding left upper arm AV fistula. I am quite concerned regarding his skin changes. He has marked aneurysmal change in this and also has adherence of his skin to the fistula vein itself. He has a great deal tortuosity above the area of access making it difficult to rotate the needle site. I have recommended revision of his fistula. Feel most appropriate to mobilize the entire length of the cephalic vein and transpose this around the current aneurysm by resecting this area with an end-to-end anastomosis. This would allow used for the entire length. He does not have very large surface veins on the right arm feel that he has limited chance for a new fistula on that side. He does understand that he would require catheter per approximately 2 months until the translocation and healed. We will coordinate this at his earliest convenience. He requests this be done in June due to upcoming events that he needs to attend to in May. I feel that this is appropriate since he has not had any bleeding.   [name], MD FACS [facility] [phone] Pager [phone]       Instructions      Return in about 1 year (around 08/30/2017).  Com  Addendum:  The patient has been re-examined and re-evaluated.  The patient's history and  physical has been reviewed and is unchanged.    Michael [name] is a 61 y.o. 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 All the risks, benefits and other treatment options have been discussed with the patient. The patient has consented to proceed with Procedure(s): REVISON OF ARTERIOVENOUS FISTULA INSERTION OF DIALYSIS CATHETER as a surgical intervention.  Curt Jews 10/05/2016 8:02 AM Vascular and Vein Surgery

## 2016-10-05 NOTE — Op Note (Signed)
    OPERATIVE REPORT  DATE OF SURGERY: 10/05/2016  PATIENT: Jefferson Fullam, 61 y.o. male [name]: [id number]  DOB: 06-Apr-1956  PRE-OPERATIVE DIAGNOSIS: End-stage renal disease with aneurysmal change of left upper arm AV fistula  POST-OPERATIVE DIAGNOSIS:  Same  PROCEDURE: #1 placement of right IJ hemodialysis tunnel catheter with SonoSite visualization, #2 revision of left upper arm AV fistula with resection of large aneurysm with eroded skin and mobilization of existing fistula with end and anastomosis of the cephalic vein  SURGEON:  Curt Jews, M.D.  PHYSICIAN ASSISTANT: Samantha Rhyne PA-C  ANESTHESIA:  Gen.  EBL: Minimal ml  Total I/O In: 400 [I.V.:400] Out: 100 [Blood:100]  BLOOD ADMINISTERED: None  DRAINS: None  SPECIMEN: None  COUNTS CORRECT:  YES  PLAN OF CARE: PACU   PATIENT DISPOSITION:  PACU - hemodynamically stable  PROCEDURE DETAILS: The patient was taken up in place position where the area of the right and left neck and chest prepped in usual sterile fashion. SonoSite ultrasound views visualization patient had a large caliber right internal jugular vein. 18-gauge needle was placed at the base of the neck into the internal jugular vein and a guidewire was passed down to the level of right atrium and this was confirmed with fluoroscopy. A dilator peel-away sheath was passed over the guidewire and the catheter was positioned through the tube away sheath which was removed. The catheter tips were placed in the level of the distal right atrium. The catheter was brought through subcutaneous tunnel through a separate stab incision and secured to the skin with a 3-0 nylon stitch. The 2 lm ports were attached in both lumens flushed and aspirated easily and were locked with 100 cc heparin. Next attention was turned to the left arm. The left arm was prepped and draped in usual sterile fashion. The patient had a large aneurysm on the distal portion of the fistula just above the  antecubital space. The incision was made over the cephalic vein just proximal and distal to the aneurysm. The tourniquet was used to elevate the arm and exsanguinated with an Esmarch and pneumatic tourniquet was inflated to 250 mmHg above the fistula. The patient was given 5000 units intravenous heparin prior to this. The vein was exposed and was controlled circumferentially above and below the aneurysm. The aneurysm was resected through these incisions. The vein was mobilized on the more proximal portion and this allowed straightening of the cephalic vein as well. The vein was down to the antecubital incision and the vein was sewn into into itself after resection of the aneurysmal portion with a running 5-0 Prolene suture. Clamps removed and excellent flow was noted and hemostasis was adequate. The wounds were irrigated with saline and closed with 3-0 Vicryl in the subcutaneous and subcuticular tissue. Sterile dressing was applied the patient was transferred to the recovery room   [name], M.D., [facility] 10/05/2016 11:18 AM

## 2016-10-05 NOTE — Anesthesia Procedure Notes (Signed)
Procedure Name: LMA Insertion Date/Time: 10/05/2016 8:53 AM Performed by: Trixie Deis A Pre-anesthesia Checklist: Patient identified, Emergency Drugs available, Suction available and Patient being monitored Patient Re-evaluated:Patient Re-evaluated prior to inductionOxygen Delivery Method: Circle System Utilized Preoxygenation: Pre-oxygenation with 100% oxygen Intubation Type: IV induction Ventilation: Mask ventilation without difficulty LMA: LMA inserted LMA Size: 5.0 Number of attempts: 1 Placement Confirmation: positive ETCO2 Tube secured with: Tape Dental Injury: Teeth and Oropharynx as per pre-operative assessment

## 2016-10-06 ENCOUNTER — Telehealth: Payer: Self-pay | Admitting: Vascular Surgery

## 2016-10-06 ENCOUNTER — Encounter (HOSPITAL_COMMUNITY): Payer: Self-pay | Admitting: Vascular Surgery

## 2016-10-06 NOTE — Telephone Encounter (Signed)
Sched appt 11/08/16 at 9:30. Spoke to pt to confirm appt.

## 2016-10-06 NOTE — Telephone Encounter (Signed)
-----   Message from Mena Goes, RN sent at 10/05/2016  1:04 PM EDT ----- Regarding: 4 weeks   ----- Message ----- From: Gabriel Earing, PA-C Sent: 10/05/2016  10:32 AM To: [facility] Charge Pool  S/p revision of left upper arm fistula 10/05/16.  F/u with Dr. Donnetta Hutching in 4 weeks.  Thanks, Aldona Bar

## 2016-10-07 DIAGNOSIS — N186 End stage renal disease: Secondary | ICD-10-CM | POA: Diagnosis not present

## 2016-10-07 DIAGNOSIS — N2581 Secondary hyperparathyroidism of renal origin: Secondary | ICD-10-CM | POA: Diagnosis not present

## 2016-10-08 DIAGNOSIS — N186 End stage renal disease: Secondary | ICD-10-CM | POA: Diagnosis not present

## 2016-10-08 DIAGNOSIS — N2581 Secondary hyperparathyroidism of renal origin: Secondary | ICD-10-CM | POA: Diagnosis not present

## 2016-10-10 DIAGNOSIS — N2581 Secondary hyperparathyroidism of renal origin: Secondary | ICD-10-CM | POA: Diagnosis not present

## 2016-10-10 DIAGNOSIS — N186 End stage renal disease: Secondary | ICD-10-CM | POA: Diagnosis not present

## 2016-10-11 DIAGNOSIS — N5201 Erectile dysfunction due to arterial insufficiency: Secondary | ICD-10-CM | POA: Diagnosis not present

## 2016-10-12 DIAGNOSIS — N2581 Secondary hyperparathyroidism of renal origin: Secondary | ICD-10-CM | POA: Diagnosis not present

## 2016-10-12 DIAGNOSIS — N186 End stage renal disease: Secondary | ICD-10-CM | POA: Diagnosis not present

## 2016-10-14 DIAGNOSIS — N2581 Secondary hyperparathyroidism of renal origin: Secondary | ICD-10-CM | POA: Diagnosis not present

## 2016-10-14 DIAGNOSIS — N186 End stage renal disease: Secondary | ICD-10-CM | POA: Diagnosis not present

## 2016-10-17 DIAGNOSIS — N2581 Secondary hyperparathyroidism of renal origin: Secondary | ICD-10-CM | POA: Diagnosis not present

## 2016-10-17 DIAGNOSIS — N186 End stage renal disease: Secondary | ICD-10-CM | POA: Diagnosis not present

## 2016-10-19 DIAGNOSIS — N2581 Secondary hyperparathyroidism of renal origin: Secondary | ICD-10-CM | POA: Diagnosis not present

## 2016-10-19 DIAGNOSIS — N186 End stage renal disease: Secondary | ICD-10-CM | POA: Diagnosis not present

## 2016-10-21 DIAGNOSIS — N186 End stage renal disease: Secondary | ICD-10-CM | POA: Diagnosis not present

## 2016-10-21 DIAGNOSIS — N2581 Secondary hyperparathyroidism of renal origin: Secondary | ICD-10-CM | POA: Diagnosis not present

## 2016-10-24 DIAGNOSIS — N186 End stage renal disease: Secondary | ICD-10-CM | POA: Diagnosis not present

## 2016-10-24 DIAGNOSIS — N2581 Secondary hyperparathyroidism of renal origin: Secondary | ICD-10-CM | POA: Diagnosis not present

## 2016-10-26 DIAGNOSIS — N186 End stage renal disease: Secondary | ICD-10-CM | POA: Diagnosis not present

## 2016-10-26 DIAGNOSIS — N2581 Secondary hyperparathyroidism of renal origin: Secondary | ICD-10-CM | POA: Diagnosis not present

## 2016-10-28 DIAGNOSIS — N2581 Secondary hyperparathyroidism of renal origin: Secondary | ICD-10-CM | POA: Diagnosis not present

## 2016-10-28 DIAGNOSIS — N186 End stage renal disease: Secondary | ICD-10-CM | POA: Diagnosis not present

## 2016-10-29 DIAGNOSIS — Z992 Dependence on renal dialysis: Secondary | ICD-10-CM | POA: Diagnosis not present

## 2016-10-29 DIAGNOSIS — Q612 Polycystic kidney, adult type: Secondary | ICD-10-CM | POA: Diagnosis not present

## 2016-10-29 DIAGNOSIS — N186 End stage renal disease: Secondary | ICD-10-CM | POA: Diagnosis not present

## 2016-10-31 ENCOUNTER — Encounter: Payer: Self-pay | Admitting: Vascular Surgery

## 2016-10-31 DIAGNOSIS — N186 End stage renal disease: Secondary | ICD-10-CM | POA: Diagnosis not present

## 2016-10-31 DIAGNOSIS — N2581 Secondary hyperparathyroidism of renal origin: Secondary | ICD-10-CM | POA: Diagnosis not present

## 2016-11-02 DIAGNOSIS — N186 End stage renal disease: Secondary | ICD-10-CM | POA: Diagnosis not present

## 2016-11-02 DIAGNOSIS — N2581 Secondary hyperparathyroidism of renal origin: Secondary | ICD-10-CM | POA: Diagnosis not present

## 2016-11-04 DIAGNOSIS — N186 End stage renal disease: Secondary | ICD-10-CM | POA: Diagnosis not present

## 2016-11-04 DIAGNOSIS — N2581 Secondary hyperparathyroidism of renal origin: Secondary | ICD-10-CM | POA: Diagnosis not present

## 2016-11-07 DIAGNOSIS — N186 End stage renal disease: Secondary | ICD-10-CM | POA: Diagnosis not present

## 2016-11-07 DIAGNOSIS — N2581 Secondary hyperparathyroidism of renal origin: Secondary | ICD-10-CM | POA: Diagnosis not present

## 2016-11-08 ENCOUNTER — Encounter: Payer: Self-pay | Admitting: Vascular Surgery

## 2016-11-08 ENCOUNTER — Ambulatory Visit (INDEPENDENT_AMBULATORY_CARE_PROVIDER_SITE_OTHER): Payer: Self-pay | Admitting: Vascular Surgery

## 2016-11-08 VITALS — BP 120/82 | HR 73 | Temp 98.1°F | Resp 20 | Ht 68.0 in | Wt 188.5 lb

## 2016-11-08 DIAGNOSIS — Z992 Dependence on renal dialysis: Secondary | ICD-10-CM

## 2016-11-08 DIAGNOSIS — N186 End stage renal disease: Secondary | ICD-10-CM

## 2016-11-08 NOTE — Progress Notes (Signed)
   Patient name: Cashtyn Pouliot [name]: [id number] DOB: 06-29-1955 Sex: male  REASON FOR VISIT: Follow-up recent revision of his left upper arm AV fistula  HPI: Nomar Broad is a 61 y.o. male here today for follow-up of his revision of his left upper fistula one month ago. If large aneurysmal area with erosion of the skin. He was taken to the operating room on 10/05/2016 and had resection of a large aneurysm and a rotary skin. He had mobilization of his fistula with an end-to-end anastomosis of the cephalic vein. He reports no difficulty with this catheter. Is here today for follow-up  Current Outpatient Prescriptions  Medication Sig Dispense Refill  . aspirin EC 81 MG EC tablet Take 1 tablet (81 mg total) by mouth daily.    [name] atorvastatin (LIPITOR) 40 MG tablet Take 40 mg by mouth every evening.     . calcium acetate (PHOSLO) 667 MG capsule Take 1,334 mg by mouth 3 (three) times daily with meals.   6  . metoprolol succinate (TOPROL-XL) 100 MG 24 hr tablet Take 100 mg by mouth daily. Take with or immediately following a meal.    . SENSIPAR 30 MG tablet Take 30 mg by mouth every evening.   5  . nitroGLYCERIN (NITROSTAT) 0.4 MG SL tablet Place 1 tablet (0.4 mg total) under the tongue every 5 (five) minutes x 3 doses as needed for chest pain. (Patient not taking: Reported on 11/08/2016) 30 tablet 12  . oxyCODONE-acetaminophen (ROXICET) 5-325 MG tablet Take 1 tablet by mouth every 6 (six) hours as needed for severe pain. (Patient not taking: Reported on 11/08/2016) 8 tablet 0   No current facility-administered medications for this visit.      PHYSICAL EXAM: Vitals:   11/08/16 0924  BP: 120/82  Pulse: 73  Resp: 20  Temp: 98.1 F (36.7 C)  TempSrc: Oral  SpO2: 96%  Weight: 188 lb 8 oz (85.5 kg)  Height: 5\' 8"  (1.727 m)    GENERAL: The patient is a well-nourished male, in no acute distress. The vital signs are documented above. Incisions are all completely  healed. With excellent thrill throughout the cephalic vein fistula. The lower portion continues to have some aneurysmal dilatation in the upper segment is somewhat tortuous but is a very large diameter as well.  MEDICAL ISSUES: he is safe to begin access again of his left upper arm AV fistula and the can have his catheter removed after several successful dialysis sessions. Will see [place] again as needed   [name], MD [facility] of [facility] Tel [phone] Pager [phone]

## 2016-11-09 DIAGNOSIS — N2581 Secondary hyperparathyroidism of renal origin: Secondary | ICD-10-CM | POA: Diagnosis not present

## 2016-11-09 DIAGNOSIS — N186 End stage renal disease: Secondary | ICD-10-CM | POA: Diagnosis not present

## 2016-11-10 DIAGNOSIS — N5201 Erectile dysfunction due to arterial insufficiency: Secondary | ICD-10-CM | POA: Diagnosis not present

## 2016-11-11 DIAGNOSIS — N186 End stage renal disease: Secondary | ICD-10-CM | POA: Diagnosis not present

## 2016-11-11 DIAGNOSIS — N2581 Secondary hyperparathyroidism of renal origin: Secondary | ICD-10-CM | POA: Diagnosis not present

## 2016-11-14 DIAGNOSIS — N2581 Secondary hyperparathyroidism of renal origin: Secondary | ICD-10-CM | POA: Diagnosis not present

## 2016-11-14 DIAGNOSIS — N186 End stage renal disease: Secondary | ICD-10-CM | POA: Diagnosis not present

## 2016-11-16 DIAGNOSIS — N186 End stage renal disease: Secondary | ICD-10-CM | POA: Diagnosis not present

## 2016-11-16 DIAGNOSIS — N2581 Secondary hyperparathyroidism of renal origin: Secondary | ICD-10-CM | POA: Diagnosis not present

## 2016-11-18 DIAGNOSIS — N186 End stage renal disease: Secondary | ICD-10-CM | POA: Diagnosis not present

## 2016-11-18 DIAGNOSIS — N2581 Secondary hyperparathyroidism of renal origin: Secondary | ICD-10-CM | POA: Diagnosis not present

## 2016-11-21 DIAGNOSIS — N186 End stage renal disease: Secondary | ICD-10-CM | POA: Diagnosis not present

## 2016-11-21 DIAGNOSIS — N2581 Secondary hyperparathyroidism of renal origin: Secondary | ICD-10-CM | POA: Diagnosis not present

## 2016-11-23 DIAGNOSIS — N2581 Secondary hyperparathyroidism of renal origin: Secondary | ICD-10-CM | POA: Diagnosis not present

## 2016-11-23 DIAGNOSIS — N186 End stage renal disease: Secondary | ICD-10-CM | POA: Diagnosis not present

## 2016-11-25 DIAGNOSIS — N186 End stage renal disease: Secondary | ICD-10-CM | POA: Diagnosis not present

## 2016-11-25 DIAGNOSIS — N2581 Secondary hyperparathyroidism of renal origin: Secondary | ICD-10-CM | POA: Diagnosis not present

## 2016-11-28 DIAGNOSIS — N186 End stage renal disease: Secondary | ICD-10-CM | POA: Diagnosis not present

## 2016-11-28 DIAGNOSIS — N2581 Secondary hyperparathyroidism of renal origin: Secondary | ICD-10-CM | POA: Diagnosis not present

## 2016-11-29 DIAGNOSIS — Z992 Dependence on renal dialysis: Secondary | ICD-10-CM | POA: Diagnosis not present

## 2016-11-29 DIAGNOSIS — Q612 Polycystic kidney, adult type: Secondary | ICD-10-CM | POA: Diagnosis not present

## 2016-11-29 DIAGNOSIS — N186 End stage renal disease: Secondary | ICD-10-CM | POA: Diagnosis not present

## 2016-11-30 DIAGNOSIS — N2581 Secondary hyperparathyroidism of renal origin: Secondary | ICD-10-CM | POA: Diagnosis not present

## 2016-11-30 DIAGNOSIS — N186 End stage renal disease: Secondary | ICD-10-CM | POA: Diagnosis not present

## 2016-12-02 DIAGNOSIS — N2581 Secondary hyperparathyroidism of renal origin: Secondary | ICD-10-CM | POA: Diagnosis not present

## 2016-12-02 DIAGNOSIS — N186 End stage renal disease: Secondary | ICD-10-CM | POA: Diagnosis not present

## 2016-12-05 DIAGNOSIS — N186 End stage renal disease: Secondary | ICD-10-CM | POA: Diagnosis not present

## 2016-12-05 DIAGNOSIS — N2581 Secondary hyperparathyroidism of renal origin: Secondary | ICD-10-CM | POA: Diagnosis not present

## 2016-12-07 DIAGNOSIS — N186 End stage renal disease: Secondary | ICD-10-CM | POA: Diagnosis not present

## 2016-12-07 DIAGNOSIS — N2581 Secondary hyperparathyroidism of renal origin: Secondary | ICD-10-CM | POA: Diagnosis not present

## 2016-12-08 ENCOUNTER — Telehealth: Payer: Self-pay | Admitting: Vascular Surgery

## 2016-12-08 DIAGNOSIS — N186 End stage renal disease: Secondary | ICD-10-CM | POA: Diagnosis not present

## 2016-12-08 DIAGNOSIS — I871 Compression of vein: Secondary | ICD-10-CM | POA: Diagnosis not present

## 2016-12-08 DIAGNOSIS — Z992 Dependence on renal dialysis: Secondary | ICD-10-CM | POA: Diagnosis not present

## 2016-12-08 DIAGNOSIS — T82858A Stenosis of vascular prosthetic devices, implants and grafts, initial encounter: Secondary | ICD-10-CM | POA: Diagnosis not present

## 2016-12-08 NOTE — Telephone Encounter (Signed)
-----   Message from Mena Goes, RN sent at 12/08/2016 10:38 AM EDT ----- Regarding: appt w/ [facility] needs duplex also   ----- Message ----- From: Conrad Sopchoppy, MD Sent: 12/08/2016  10:18 AM To: Vvs Charge [address]  Fallon Howerter [id number] 1955-11-17  Pt is essential requesting a second opinion.  I would schedule him for 2-4 weeks to let his hematoma heal up.  Will need L arm access duplex for that appointment

## 2016-12-08 NOTE — Telephone Encounter (Signed)
Sched appt 12/21/16; lab at 11:00 and MD at 12:30. Spoke to pt.

## 2016-12-09 DIAGNOSIS — N2581 Secondary hyperparathyroidism of renal origin: Secondary | ICD-10-CM | POA: Diagnosis not present

## 2016-12-09 DIAGNOSIS — N186 End stage renal disease: Secondary | ICD-10-CM | POA: Diagnosis not present

## 2016-12-12 DIAGNOSIS — N2581 Secondary hyperparathyroidism of renal origin: Secondary | ICD-10-CM | POA: Diagnosis not present

## 2016-12-12 DIAGNOSIS — N186 End stage renal disease: Secondary | ICD-10-CM | POA: Diagnosis not present

## 2016-12-13 ENCOUNTER — Encounter: Payer: Self-pay | Admitting: Vascular Surgery

## 2016-12-13 ENCOUNTER — Other Ambulatory Visit: Payer: Self-pay

## 2016-12-13 DIAGNOSIS — T82858D Stenosis of vascular prosthetic devices, implants and grafts, subsequent encounter: Secondary | ICD-10-CM

## 2016-12-14 DIAGNOSIS — N2581 Secondary hyperparathyroidism of renal origin: Secondary | ICD-10-CM | POA: Diagnosis not present

## 2016-12-14 DIAGNOSIS — N186 End stage renal disease: Secondary | ICD-10-CM | POA: Diagnosis not present

## 2016-12-16 DIAGNOSIS — N186 End stage renal disease: Secondary | ICD-10-CM | POA: Diagnosis not present

## 2016-12-16 DIAGNOSIS — N2581 Secondary hyperparathyroidism of renal origin: Secondary | ICD-10-CM | POA: Diagnosis not present

## 2016-12-16 NOTE — Progress Notes (Signed)
Established Dialysis Access   History of Present Illness   Michael [name] is a 61 y.o. 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  This underwent attempt at salvage of left brachiocephalic arteriovenous fistula on 10/05/16 with Dr. Donnetta Hutching.  Unfortunately, since then the prior aneurysm has recurred and is larger than previously.  The patient notes HD is via a RIJV [place] currently.  The patient has never had steal sx in the L arm.   Past Medical History:  Diagnosis Date  . ESRD (end stage renal disease) on dialysis [facility])    Started dialysis Feb 2014, gets HD MWF at [facility]. Saw Dr Moshe Cipro prior to starting HD.  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 All except for one of his father's siblings had dialysis.  Has one brother with renal transplant, esrd also due to cystic kidney disease.    . Fatigue   . Hyperlipidemia   . Hyperparathyroidism, secondary renal ([place])   . Hypertension   . Hypoxemia 12/12/2013  . Kidney stones    pased one, One Lazer   . Nonischemic cardiomyopathy ([place])    Er 25% 2015, 55 % 2013  . OSA on CPAP    uses cpap setting of 4  . OSA on CPAP 03/24/2014  . Polycystic kidney disease, autosomal dominant   . Ventricular tachycardia//Freq PVCs     Past Surgical History:  Procedure Laterality Date  . APPENDECTOMY    . AV FISTULA PLACEMENT  12/05/2011   Procedure: ARTERIOVENOUS (AV) FISTULA CREATION;LLEFT ARM  Surgeon: Conrad Blucksberg Mountain, MD;  Location: [place];  Service: Vascular;  Laterality: Left;  RADIO-CEPHALIC  fistula left arm  . AV FISTULA PLACEMENT  01/11/2012   Procedure: ARTERIOVENOUS (AV) FISTULA CREATION;  Surgeon: Conrad , MD;  Location: [place];  Service: Vascular;  Laterality: Left;  Creation of left brachial cephalic arteriovenous fistula  . CARDIAC CATHETERIZATION  04-05-2010   checking for blockage but none-[facility]  . CYSTOSCOPY W/ STONE MANIPULATION     "laser once" (01/22/2013)  . HERNIA  REPAIR    . INGUINAL HERNIA REPAIR Right 11/06/2015   Procedure: OPEN HERNIA REPAIR  RIGHT INGUINAL ADULT;  Surgeon: [name], MD;  [facility];  Service: General;  Laterality: Right;  with MESH  . INSERTION OF DIALYSIS CATHETER Right 10/05/2016   Procedure: INSERTION OF right internal jugular DIALYSIS CATHETER;  Surgeon: [name], MD;  Location: [place];  Service: Vascular;  Laterality: Right;  . INSERTION OF MESH  03/20/2012   Procedure: INSERTION OF MESH;  UMB Surgeon: [name], MD;  Location: [place];  Service: General;  Laterality: N/A;  . INSERTION OF MESH N/A 01/22/2013   Procedure: INSERTION OF MESH;  Surgeon: [name], MD;  Location: [place];  Service: General;  Laterality: N/A;  . LAPAROTOMY  04/02/2012   Procedure: EXPLORATORY LAPAROTOMY;  Surgeon: [name], MD;  Location: [place];  Service: General;  Laterality: N/A;  Exploratory Laparotomy with resection of small intestine  . LEFT HEART CATHETERIZATION WITH CORONARY ANGIOGRAM N/A 05/14/2013   Procedure: LEFT HEART CATHETERIZATION WITH CORONARY ANGIOGRAM;  Surgeon: Sinclair Grooms, MD;  Location: [facility] CATH LAB;  Service: Cardiovascular;  Laterality: N/A;  . REVISON OF ARTERIOVENOUS FISTULA Left 10/05/2016   Procedure: REVISON OF left arm ARTERIOVENOUS FISTULA;  Surgeon: [name], MD;  Location: [place];  Service: Vascular;  Laterality: Left;  .  TONSILLECTOMY    . UMBILICAL HERNIA REPAIR  03/20/2012   Procedure: HERNIA REPAIR UMBILICAL ADULT;  Surgeon: [name], MD;  Location: [place];  Service: General;  Laterality: N/A;  . UMBILICAL HERNIA REPAIR  01/22/2013   preperitoneal open procedure due to significant adhesions/notes 01/22/2013  . VENTRAL HERNIA REPAIR N/A 01/22/2013   Procedure: ATTEMPTED LAPAROSCOPIC VENTRAL HERNIA CONVERTED TO OPEN;  Surgeon: [name], MD;  Location: [place];  Service: General;  Laterality: N/A;    Social History   Social History  . Marital status: Married     Spouse name: Michael [name]  . Number of children: 3  . Years of education: 12   Occupational History  .      disabled   Social History Main Topics  . Smoking status: Never Smoker  . Smokeless tobacco: Never Used  . Alcohol use No  . Drug use: No  . Sexual activity: Yes   Other Topics Concern  . Not on file   Social History Narrative   Patient is married [place]) and lives at home with his wife and children.   Patient has three children.   Patient is in disability.   Patient has a high school education.   Patient is right-handed   Patient drinks very little soda.                Family History  Problem Relation Age of Onset  . Heart disease Mother   . Hyperlipidemia Mother   . Hypertension Mother   . Kidney disease Father   . Stroke Father   . Kidney disease Brother     Current Outpatient Prescriptions  Medication Sig Dispense Refill  . aspirin EC 81 MG EC tablet Take 1 tablet (81 mg total) by mouth daily.    [name] atorvastatin (LIPITOR) 40 MG tablet Take 40 mg by mouth every evening.     . calcium acetate (PHOSLO) 667 MG capsule Take 1,334 mg by mouth 3 (three) times daily with meals.   6  . metoprolol succinate (TOPROL-XL) 100 MG 24 hr tablet Take 100 mg by mouth daily. Take with or immediately following a meal.    . nitroGLYCERIN (NITROSTAT) 0.4 MG SL tablet Place 1 tablet (0.4 mg total) under the tongue every 5 (five) minutes x 3 doses as needed for chest pain. 30 tablet 12  . oxyCODONE-acetaminophen (ROXICET) 5-325 MG tablet Take 1 tablet by mouth every 6 (six) hours as needed for severe pain. 8 tablet 0  . SENSIPAR 30 MG tablet Take 30 mg by mouth every evening.   5   No current facility-administered medications for this visit.      Allergies  Allergen Reactions  . No Known Allergies     REVIEW OF SYSTEMS (negative unless checked):   Cardiac:  []  Chest pain or chest pressure? []  Shortness of breath upon activity? []  Shortness of breath when lying flat? []   Irregular heart rhythm?  Vascular:  []  Pain in calf, thigh, or hip brought on by walking? []  Pain in feet at night that wakes you up from your sleep? []  Blood clot in your veins? []  Leg swelling?  Pulmonary:  []  Oxygen at home? []  Productive cough? []  Wheezing?  Neurologic:  []  Sudden weakness in arms or legs? []  Sudden numbness in arms or legs? []  Sudden onset of difficult speaking or slurred speech? []  Temporary loss of vision in one eye? []  Problems with dizziness?  Gastrointestinal:  []  Blood in stool? []  Vomited blood?  Genitourinary:  []  Burning when urinating? []  Blood in urine? [x]  end stage renal disease-HD: M/W/F   Psychiatric:  []  Major depression  Hematologic:  []  Bleeding problems? []  Problems with blood clotting?  Dermatologic:  []  Rashes or ulcers?  Constitutional:  []  Fever or chills?  Ear/Nose/Throat:  []  Change in hearing? []  Nose bleeds? []  Sore throat?  Musculoskeletal:  []  Back pain? []  Joint pain? []  Muscle pain?    Physical Examination   Vitals:   12/21/16 1112  BP: 120/79  Pulse: 71  Resp: 20  Temp: (!) 97.4 F (36.3 C)  SpO2: 99%  Weight: 193 lb (87.5 kg)  Height: 5\' 8"  (1.727 m)   Body mass index is 29.35 kg/m.  General Alert, O x 3, WD, NAD  Pulmonary Sym exp, good B air movt, CTA B  Cardiac RRR, Nl S1, S2, no Murmurs, No rubs, No S3,S4  Vascular Vessel Right Left  Radial Palpable Faintly palpable  Brachial Palpable Palpable  Ulnar Not palpable Not palpable    Musculo- skeletal M/S 5/5 throughout  , Extremities without ischemic changes. 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 male who presents with ESRD requiring hemodialysis, contained rupture of left BC AVF   The L BC AVF is no longer salvageable.   I would ligate the L BC AVF and evacuate the hematoma, resecting any redundant skin.    At the same time, I would go ahead and place a L 1st BVT.  Normally, I would consider proceeding with a single stage BVT, but the ruptured L BC AVF sits in the route of such, so we need the arm to heal up prior to tunneling the BVT.  I had an extensive discussion with this patient in regards to the nature of access surgery, including risk, benefits, and alternatives.    The patient is aware that the risks of access surgery include but are not limited to: bleeding, infection, steal syndrome, nerve damage, ischemic monomelic neuropathy, failure of access to mature, and possible need for additional access procedures in the future.  The patient is aware that I construct transposition procedures in two stages, requiring a separate operation to complete the procedure.  The patient has  agreed to proceed with the above procedure which will be scheduled Tuesday 28 AUG 18.   Adele Barthel, MD, FACS [facility] [place] Office: [phone] Pager: [phone]

## 2016-12-19 DIAGNOSIS — N186 End stage renal disease: Secondary | ICD-10-CM | POA: Diagnosis not present

## 2016-12-19 DIAGNOSIS — N2581 Secondary hyperparathyroidism of renal origin: Secondary | ICD-10-CM | POA: Diagnosis not present

## 2016-12-21 ENCOUNTER — Ambulatory Visit (HOSPITAL_COMMUNITY)
Admission: RE | Admit: 2016-12-21 | Discharge: 2016-12-21 | Disposition: A | Discharge status: Home / Self Care | Payer: Medicare Other | Source: Ambulatory Visit | Attending: Vascular Surgery | Admitting: Vascular Surgery

## 2016-12-21 ENCOUNTER — Ambulatory Visit (INDEPENDENT_AMBULATORY_CARE_PROVIDER_SITE_OTHER): Payer: Self-pay | Admitting: Vascular Surgery

## 2016-12-21 ENCOUNTER — Encounter: Payer: Self-pay | Admitting: Vascular Surgery

## 2016-12-21 ENCOUNTER — Encounter: Payer: Self-pay | Admitting: *Deleted

## 2016-12-21 VITALS — BP 120/79 | HR 71 | Temp 97.4°F | Resp 20 | Ht 68.0 in | Wt 193.0 lb

## 2016-12-21 DIAGNOSIS — T82510D Breakdown (mechanical) of surgically created arteriovenous fistula, subsequent encounter: Secondary | ICD-10-CM

## 2016-12-21 DIAGNOSIS — I721 Aneurysm of artery of upper extremity: Secondary | ICD-10-CM | POA: Insufficient documentation

## 2016-12-21 DIAGNOSIS — T82898D Other specified complication of vascular prosthetic devices, implants and grafts, subsequent encounter: Secondary | ICD-10-CM

## 2016-12-21 DIAGNOSIS — X58XXXD Exposure to other specified factors, subsequent encounter: Secondary | ICD-10-CM | POA: Insufficient documentation

## 2016-12-21 DIAGNOSIS — T82858D Stenosis of vascular prosthetic devices, implants and grafts, subsequent encounter: Secondary | ICD-10-CM | POA: Diagnosis not present

## 2016-12-22 ENCOUNTER — Other Ambulatory Visit: Payer: Self-pay | Admitting: *Deleted

## 2016-12-22 DIAGNOSIS — N2581 Secondary hyperparathyroidism of renal origin: Secondary | ICD-10-CM | POA: Diagnosis not present

## 2016-12-22 DIAGNOSIS — N186 End stage renal disease: Secondary | ICD-10-CM | POA: Diagnosis not present

## 2016-12-23 ENCOUNTER — Encounter (HOSPITAL_COMMUNITY): Payer: Self-pay | Admitting: General Practice

## 2016-12-23 DIAGNOSIS — N186 End stage renal disease: Secondary | ICD-10-CM | POA: Diagnosis not present

## 2016-12-23 DIAGNOSIS — N2581 Secondary hyperparathyroidism of renal origin: Secondary | ICD-10-CM | POA: Diagnosis not present

## 2016-12-23 NOTE — Progress Notes (Addendum)
Patient denies chest pain and states that he does have shortness of breath with exercise.  EKG completed on 10/05/16.  Patient states that he received instructions from the office to continue taking 81 mg Aspirin.   Patient instructed to take metoprolol day of surgery and to have nothing to eat or drink after midnight.  Patient verbalized understanding.     Patient denies being under the care of a cardiologist.     PCP - Dr. Alyson Ingles

## 2016-12-26 DIAGNOSIS — N2581 Secondary hyperparathyroidism of renal origin: Secondary | ICD-10-CM | POA: Diagnosis not present

## 2016-12-26 DIAGNOSIS — N186 End stage renal disease: Secondary | ICD-10-CM | POA: Diagnosis not present

## 2016-12-26 NOTE — Progress Notes (Signed)
Anesthesia Chart Review:  Pt is a same day work up.   Pt is a 61 year old male scheduled for ligation of AV fistula, evacuation of hematoma, first stage L basilic transformation on 12/27/2016 with Adele Barthel, MD  - Used to see EP cardiologist Virl Axe, MD.  Last office visit 10/24/13; pt turned down treatment for cardiomyopathy/ICD; f/u in 1 month for nonischemic cardiomyopathy recommended but did not happen.   PMH includes:  Nonischemic cardiomyopathy, ventricular tachycardia (05/2013), PVCs, HTN, hyperlipidemia, ESRD on hemodialysis, polycystic kidney disease, OSA. Never smoker. BMI 29 - S/p AV fistula revision, insertion of dialysis catheter 10/05/16; R inguinal hernia repair 11/06/15; ventral hernia repair 01/22/13; exploratory laparotomy 07/04/44; umbilical hernia repair 56/81/27   Medications include: ASA 81mg , lipitor, metoprolol  Labs will be obtained DOS  1 view CXR 10/05/16:  - Central catheter tip at cavoatrial junction. No pneumothorax. - Pulmonary vascular congestion without edema or consolidation. Small loculated effusion on the right.  EKG 10/05/16: Sinus rhythm with 1st degree A-V block with occasional PVCs. Right superior axis deviation. Non-specific intra-ventricular conduction block. Cannot rule out Inferior infarct, age undetermined. Appears stable when compared to prior EKG 09/24/15  Echo 07/23/13:  - Left ventricle: The cavity size was mildly dilated. Wall thickness was normal. Systolic function was severely reduced. The estimated ejection fraction was in the range of 25% to 30%. There is akinesis of the posterior myocardium. There is severe hypokinesis of the lateral myocardium. There is hypokinesis of the anterior myocardium. Doppler parameters are consistent with abnormal left ventricular relaxation (grade 1 diastolic dysfunction). - Aortic valve: Mild regurgitation. - Aortic root: The aortic root was mildly dilated. - Left atrium: The atrium was mildly dilated.  Cardiac cath  05/14/13:  1. Nonischemic cardiomyopathy with reduced EF less than 25% and severely elevated left ventricular end-diastolic pressures 2. Widely patent coronary arteries  Reviewed case with Dr. Conrad Cadwell.   If labs acceptable DOS, I anticipate pt can proceed as scheduled.   Willeen Cass, FNP-BC [facility] Short Stay [facility]/Anesthesiology Phone: [phone] 12/26/2016 11:36 AM

## 2016-12-27 ENCOUNTER — Ambulatory Visit (HOSPITAL_COMMUNITY): Payer: Medicare Other | Admitting: Emergency Medicine

## 2016-12-27 ENCOUNTER — Encounter (HOSPITAL_COMMUNITY)
Admission: RE | Disposition: A | Discharge status: Home / Self Care | Payer: Self-pay | Source: Ambulatory Visit | Attending: Vascular Surgery

## 2016-12-27 ENCOUNTER — Encounter (HOSPITAL_COMMUNITY): Payer: Self-pay | Admitting: *Deleted

## 2016-12-27 ENCOUNTER — Ambulatory Visit (HOSPITAL_COMMUNITY)
Admission: RE | Admit: 2016-12-27 | Discharge: 2016-12-27 | Disposition: A | Discharge status: Home / Self Care | Payer: Medicare Other | Source: Ambulatory Visit | Attending: Vascular Surgery | Admitting: Vascular Surgery

## 2016-12-27 DIAGNOSIS — G4733 Obstructive sleep apnea (adult) (pediatric): Secondary | ICD-10-CM | POA: Diagnosis not present

## 2016-12-27 DIAGNOSIS — Z79899 Other long term (current) drug therapy: Secondary | ICD-10-CM | POA: Insufficient documentation

## 2016-12-27 DIAGNOSIS — E785 Hyperlipidemia, unspecified: Secondary | ICD-10-CM | POA: Insufficient documentation

## 2016-12-27 DIAGNOSIS — I120 Hypertensive chronic kidney disease with stage 5 chronic kidney disease or end stage renal disease: Secondary | ICD-10-CM | POA: Insufficient documentation

## 2016-12-27 DIAGNOSIS — N186 End stage renal disease: Secondary | ICD-10-CM | POA: Diagnosis not present

## 2016-12-27 DIAGNOSIS — Z992 Dependence on renal dialysis: Secondary | ICD-10-CM | POA: Diagnosis not present

## 2016-12-27 DIAGNOSIS — T82898A Other specified complication of vascular prosthetic devices, implants and grafts, initial encounter: Secondary | ICD-10-CM | POA: Insufficient documentation

## 2016-12-27 DIAGNOSIS — Q612 Polycystic kidney, adult type: Secondary | ICD-10-CM | POA: Insufficient documentation

## 2016-12-27 DIAGNOSIS — Z841 Family history of disorders of kidney and ureter: Secondary | ICD-10-CM | POA: Diagnosis not present

## 2016-12-27 DIAGNOSIS — I429 Cardiomyopathy, unspecified: Secondary | ICD-10-CM | POA: Diagnosis not present

## 2016-12-27 DIAGNOSIS — I770 Arteriovenous fistula, acquired: Secondary | ICD-10-CM | POA: Diagnosis not present

## 2016-12-27 DIAGNOSIS — Y832 Surgical operation with anastomosis, bypass or graft as the cause of abnormal reaction of the patient, or of later complication, without mention of misadventure at the time of the procedure: Secondary | ICD-10-CM | POA: Diagnosis not present

## 2016-12-27 DIAGNOSIS — N2581 Secondary hyperparathyroidism of renal origin: Secondary | ICD-10-CM | POA: Insufficient documentation

## 2016-12-27 DIAGNOSIS — Z7982 Long term (current) use of aspirin: Secondary | ICD-10-CM | POA: Insufficient documentation

## 2016-12-27 DIAGNOSIS — I130 Hypertensive heart and chronic kidney disease with heart failure and stage 1 through stage 4 chronic kidney disease, or unspecified chronic kidney disease: Secondary | ICD-10-CM | POA: Diagnosis not present

## 2016-12-27 DIAGNOSIS — I509 Heart failure, unspecified: Secondary | ICD-10-CM | POA: Diagnosis not present

## 2016-12-27 HISTORY — PX: BASCILIC VEIN TRANSPOSITION: SHX5742

## 2016-12-27 HISTORY — PX: LIGATION OF ARTERIOVENOUS  FISTULA: SHX5948

## 2016-12-27 HISTORY — DX: Dyspnea, unspecified: R06.00

## 2016-12-27 LAB — POCT I-STAT 4, (NA,K, GLUC, HGB,HCT)
Glucose, Bld: 73 mg/dL (ref 65–99)
HCT: 42 % (ref 39.0–52.0)
Hemoglobin: 14.3 g/dL (ref 13.0–17.0)
Potassium: 3.9 mmol/L (ref 3.5–5.1)
Sodium: 137 mmol/L (ref 135–145)

## 2016-12-27 SURGERY — LIGATION OF ARTERIOVENOUS  FISTULA
Anesthesia: General | Site: Arm Upper | Laterality: Left

## 2016-12-27 MED ORDER — DEXAMETHASONE SODIUM PHOSPHATE 10 MG/ML IJ SOLN
INTRAMUSCULAR | Status: AC
  Filled 2016-12-27: qty 1

## 2016-12-27 MED ORDER — PROMETHAZINE HCL 25 MG/ML IJ SOLN: 6.2500 mg | INTRAMUSCULAR | Status: DC | PRN

## 2016-12-27 MED ORDER — SODIUM CHLORIDE 0.9 % IV SOLN
INTRAVENOUS | Status: DC
  Administered 2016-12-27 (×2): via INTRAVENOUS

## 2016-12-27 MED ORDER — NEOSTIGMINE METHYLSULFATE 10 MG/10ML IV SOLN
INTRAVENOUS | Status: DC | PRN
  Administered 2016-12-27: 4 mg via INTRAVENOUS

## 2016-12-27 MED ORDER — DEXTROSE 5 % IV SOLN
1.5000 g | INTRAVENOUS | Status: AC
  Administered 2016-12-27: 1.5 g via INTRAVENOUS

## 2016-12-27 MED ORDER — CEFUROXIME SODIUM 1.5 G IV SOLR
INTRAVENOUS | Status: AC
  Filled 2016-12-27: qty 1.5

## 2016-12-27 MED ORDER — HEMOSTATIC AGENTS (NO CHARGE) OPTIME
TOPICAL | Status: DC | PRN
  Administered 2016-12-27: 1 via TOPICAL

## 2016-12-27 MED ORDER — ONDANSETRON HCL 4 MG/2ML IJ SOLN
INTRAMUSCULAR | Status: AC
  Filled 2016-12-27: qty 2

## 2016-12-27 MED ORDER — FENTANYL CITRATE (PF) 100 MCG/2ML IJ SOLN: 25.0000 ug | INTRAMUSCULAR | Status: DC | PRN

## 2016-12-27 MED ORDER — CHLORHEXIDINE GLUCONATE 4 % EX LIQD: 60.0000 mL | Freq: Once | CUTANEOUS | Status: DC

## 2016-12-27 MED ORDER — GLYCOPYRROLATE 0.2 MG/ML IJ SOLN
INTRAMUSCULAR | Status: DC | PRN
  Administered 2016-12-27: 0.6 mg via INTRAVENOUS

## 2016-12-27 MED ORDER — MIDAZOLAM HCL 5 MG/5ML IJ SOLN
INTRAMUSCULAR | Status: DC | PRN
  Administered 2016-12-27: 2 mg via INTRAVENOUS

## 2016-12-27 MED ORDER — FENTANYL CITRATE (PF) 100 MCG/2ML IJ SOLN
INTRAMUSCULAR | Status: DC | PRN
  Administered 2016-12-27 (×2): 50 ug via INTRAVENOUS

## 2016-12-27 MED ORDER — HEPARIN SODIUM (PORCINE) 5000 UNIT/ML IJ SOLN
INTRAMUSCULAR | Status: DC | PRN
  Administered 2016-12-27: 11:00:00

## 2016-12-27 MED ORDER — 0.9 % SODIUM CHLORIDE (POUR BTL) OPTIME
TOPICAL | Status: DC | PRN
  Administered 2016-12-27: 1000 mL

## 2016-12-27 MED ORDER — PHENYLEPHRINE 40 MCG/ML (10ML) SYRINGE FOR IV PUSH (FOR BLOOD PRESSURE SUPPORT)
PREFILLED_SYRINGE | INTRAVENOUS | Status: AC
  Filled 2016-12-27: qty 10

## 2016-12-27 MED ORDER — METOPROLOL SUCCINATE ER 25 MG PO TB24
ORAL_TABLET | ORAL | Status: AC
  Filled 2016-12-27: qty 2

## 2016-12-27 MED ORDER — LIDOCAINE HCL (CARDIAC) 20 MG/ML IV SOLN
INTRAVENOUS | Status: DC | PRN
  Administered 2016-12-27: 60 mg via INTRAVENOUS

## 2016-12-27 MED ORDER — METOPROLOL SUCCINATE ER 50 MG PO TB24
50.0000 mg | ORAL_TABLET | Freq: Once | ORAL | Status: AC
  Administered 2016-12-27: 50 mg via ORAL
  Filled 2016-12-27: qty 1

## 2016-12-27 MED ORDER — LIDOCAINE HCL (PF) 1 % IJ SOLN
INTRAMUSCULAR | Status: AC
  Filled 2016-12-27: qty 30

## 2016-12-27 MED ORDER — ROCURONIUM BROMIDE 10 MG/ML (PF) SYRINGE
PREFILLED_SYRINGE | INTRAVENOUS | Status: AC
  Filled 2016-12-27: qty 5

## 2016-12-27 MED ORDER — ROCURONIUM BROMIDE 100 MG/10ML IV SOLN
INTRAVENOUS | Status: DC | PRN
  Administered 2016-12-27: 10 mg via INTRAVENOUS
  Administered 2016-12-27: 40 mg via INTRAVENOUS

## 2016-12-27 MED ORDER — FENTANYL CITRATE (PF) 250 MCG/5ML IJ SOLN
INTRAMUSCULAR | Status: AC
  Filled 2016-12-27: qty 5

## 2016-12-27 MED ORDER — ETOMIDATE 2 MG/ML IV SOLN
INTRAVENOUS | Status: DC | PRN
  Administered 2016-12-27: 2 mg via INTRAVENOUS
  Administered 2016-12-27: 14 mg via INTRAVENOUS

## 2016-12-27 MED ORDER — CISATRACURIUM BESYLATE 20 MG/10ML IV SOLN
INTRAVENOUS | Status: AC
  Filled 2016-12-27: qty 10

## 2016-12-27 MED ORDER — PHENYLEPHRINE HCL 10 MG/ML IJ SOLN
INTRAMUSCULAR | Status: DC | PRN
  Administered 2016-12-27 (×2): 80 ug via INTRAVENOUS

## 2016-12-27 MED ORDER — ONDANSETRON HCL 4 MG/2ML IJ SOLN
INTRAMUSCULAR | Status: DC | PRN
  Administered 2016-12-27: 4 mg via INTRAVENOUS

## 2016-12-27 MED ORDER — MIDAZOLAM HCL 2 MG/2ML IJ SOLN
INTRAMUSCULAR | Status: AC
  Filled 2016-12-27: qty 2

## 2016-12-27 MED ORDER — OXYCODONE-ACETAMINOPHEN 5-325 MG PO TABS: 1.0000 | ORAL_TABLET | ORAL | 0 refills | Status: DC | PRN

## 2016-12-27 MED ORDER — PHENYLEPHRINE HCL 10 MG/ML IJ SOLN
INTRAMUSCULAR | Status: DC | PRN
  Administered 2016-12-27: 40 ug/min via INTRAVENOUS

## 2016-12-27 SURGICAL SUPPLY — 50 items
ADH SKN CLS APL DERMABOND .7 (GAUZE/BANDAGES/DRESSINGS) ×1
AGENT HMST SPONGE THK3/8 (HEMOSTASIS) ×1
ARMBAND PINK RESTRICT EXTREMIT (MISCELLANEOUS) ×2 IMPLANT
BNDG CMPR 9X4 STRL LF SNTH (GAUZE/BANDAGES/DRESSINGS)
BNDG ESMARK 4X9 LF (GAUZE/BANDAGES/DRESSINGS) IMPLANT
CANISTER SUCT 3000ML PPV (MISCELLANEOUS) ×2 IMPLANT
CLIP VESOCCLUDE MED 24/CT (CLIP) ×2 IMPLANT
CLIP VESOCCLUDE SM WIDE 24/CT (CLIP) ×2 IMPLANT
COVER PROBE W GEL 5X96 (DRAPES) ×2 IMPLANT
CUFF TOURNIQUET SINGLE 18IN (TOURNIQUET CUFF) IMPLANT
DECANTER SPIKE VIAL GLASS SM (MISCELLANEOUS) ×2 IMPLANT
DERMABOND ADVANCED (GAUZE/BANDAGES/DRESSINGS) ×1
DERMABOND ADVANCED .7 DNX12 (GAUZE/BANDAGES/DRESSINGS) ×1 IMPLANT
ELECT REM PT RETURN 9FT ADLT (ELECTROSURGICAL) ×2
ELECTRODE REM PT RTRN 9FT ADLT (ELECTROSURGICAL) ×1 IMPLANT
GAUZE SPONGE 4X4 12PLY STRL (GAUZE/BANDAGES/DRESSINGS) ×2 IMPLANT
GAUZE SPONGE 4X4 12PLY STRL LF (GAUZE/BANDAGES/DRESSINGS) ×1 IMPLANT
GLOVE BIO SURGEON STRL SZ 6.5 (GLOVE) ×1 IMPLANT
GLOVE BIO SURGEON STRL SZ7 (GLOVE) ×2 IMPLANT
GLOVE BIO SURGEON STRL SZ8.5 (GLOVE) ×1 IMPLANT
GLOVE BIOGEL PI IND STRL 6.5 (GLOVE) IMPLANT
GLOVE BIOGEL PI IND STRL 7.0 (GLOVE) IMPLANT
GLOVE BIOGEL PI IND STRL 7.5 (GLOVE) ×1 IMPLANT
GLOVE BIOGEL PI INDICATOR 6.5 (GLOVE) ×3
GLOVE BIOGEL PI INDICATOR 7.0 (GLOVE) ×1
GLOVE BIOGEL PI INDICATOR 7.5 (GLOVE) ×2
GLOVE ECLIPSE 6.5 STRL STRAW (GLOVE) ×2 IMPLANT
GLOVE ECLIPSE 7.0 STRL STRAW (GLOVE) ×1 IMPLANT
GOWN STRL REUS W/ TWL LRG LVL3 (GOWN DISPOSABLE) ×3 IMPLANT
GOWN STRL REUS W/TWL LRG LVL3 (GOWN DISPOSABLE) ×10
HEMOSTAT SPONGE AVITENE ULTRA (HEMOSTASIS) ×1 IMPLANT
KIT BASIN OR (CUSTOM PROCEDURE TRAY) ×2 IMPLANT
KIT ROOM TURNOVER OR (KITS) ×2 IMPLANT
NS IRRIG 1000ML POUR BTL (IV SOLUTION) ×2 IMPLANT
PACK CV ACCESS (CUSTOM PROCEDURE TRAY) ×2 IMPLANT
PAD ARMBOARD 7.5X6 YLW CONV (MISCELLANEOUS) ×4 IMPLANT
SPONGE LAP 18X18 X RAY DECT (DISPOSABLE) ×1 IMPLANT
STAPLER VISISTAT 35W (STAPLE) ×1 IMPLANT
SUT MNCRL AB 4-0 PS2 18 (SUTURE) ×3 IMPLANT
SUT PROLENE 5 0 C 1 24 (SUTURE) ×1 IMPLANT
SUT PROLENE 6 0 BV (SUTURE) ×2 IMPLANT
SUT PROLENE 7 0 BV 1 (SUTURE) ×2 IMPLANT
SUT SILK 0 TIES 10X30 (SUTURE) ×2 IMPLANT
SUT VIC AB 2-0 CT1 27 (SUTURE) ×4
SUT VIC AB 2-0 CT1 TAPERPNT 27 (SUTURE) ×1 IMPLANT
SUT VIC AB 3-0 SH 27 (SUTURE) ×2
SUT VIC AB 3-0 SH 27X BRD (SUTURE) ×2 IMPLANT
TAPE CLOTH SURG 4X10 WHT LF (GAUZE/BANDAGES/DRESSINGS) ×1 IMPLANT
UNDERPAD 30X30 (UNDERPADS AND DIAPERS) ×2 IMPLANT
WATER STERILE IRR 1000ML POUR (IV SOLUTION) ×2 IMPLANT

## 2016-12-27 NOTE — H&P (View-Only) (Signed)
Established Dialysis Access   History of Present Illness   Michael [name] is a 61 y.o. 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  This underwent attempt at salvage of left brachiocephalic arteriovenous fistula on 10/05/16 with Dr. Donnetta Hutching.  Unfortunately, since then the prior aneurysm has recurred and is larger than previously.  The patient notes HD is via a RIJV [place] currently.  The patient has never had steal sx in the L arm.   Past Medical History:  Diagnosis Date  . ESRD (end stage renal disease) on dialysis [facility])    Started dialysis Feb 2014, gets HD MWF at [facility]. Saw Dr Moshe Cipro prior to starting HD.  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 All except for one of his father's siblings had dialysis.  Has one brother with renal transplant, esrd also due to cystic kidney disease.    . Fatigue   . Hyperlipidemia   . Hyperparathyroidism, secondary renal ([place])   . Hypertension   . Hypoxemia 12/12/2013  . Kidney stones    pased one, One Lazer   . Nonischemic cardiomyopathy ([place])    Er 25% 2015, 55 % 2013  . OSA on CPAP    uses cpap setting of 4  . OSA on CPAP 03/24/2014  . Polycystic kidney disease, autosomal dominant   . Ventricular tachycardia//Freq PVCs     Past Surgical History:  Procedure Laterality Date  . APPENDECTOMY    . AV FISTULA PLACEMENT  12/05/2011   Procedure: ARTERIOVENOUS (AV) FISTULA CREATION;LLEFT ARM  Surgeon: Conrad [place], MD;  Location: [place];  Service: Vascular;  Laterality: Left;  RADIO-CEPHALIC  fistula left arm  . AV FISTULA PLACEMENT  01/11/2012   Procedure: ARTERIOVENOUS (AV) FISTULA CREATION;  Surgeon: Conrad [place], MD;  Location: [place];  Service: Vascular;  Laterality: Left;  Creation of left brachial cephalic arteriovenous fistula  . CARDIAC CATHETERIZATION  04-05-2010   checking for blockage but none-[facility]  . CYSTOSCOPY W/ STONE MANIPULATION     "laser once" (01/22/2013)  . HERNIA  REPAIR    . INGUINAL HERNIA REPAIR Right 11/06/2015   Procedure: OPEN HERNIA REPAIR  RIGHT INGUINAL ADULT;  Surgeon: [name], MD;  [facility];  Service: General;  Laterality: Right;  with MESH  . INSERTION OF DIALYSIS CATHETER Right 10/05/2016   Procedure: INSERTION OF right internal jugular DIALYSIS CATHETER;  Surgeon: [name], MD;  Location: [place];  Service: Vascular;  Laterality: Right;  . INSERTION OF MESH  03/20/2012   Procedure: INSERTION OF MESH;  UMB Surgeon: [name], MD;  Location: [place];  Service: General;  Laterality: N/A;  . INSERTION OF MESH N/A 01/22/2013   Procedure: INSERTION OF MESH;  Surgeon: [name], MD;  Location: [place];  Service: General;  Laterality: N/A;  . LAPAROTOMY  04/02/2012   Procedure: EXPLORATORY LAPAROTOMY;  Surgeon: [name], MD;  Location: [place];  Service: General;  Laterality: N/A;  Exploratory Laparotomy with resection of small intestine  . LEFT HEART CATHETERIZATION WITH CORONARY ANGIOGRAM N/A 05/14/2013   Procedure: LEFT HEART CATHETERIZATION WITH CORONARY ANGIOGRAM;  Surgeon: Sinclair Grooms, MD;  Location: [facility] CATH LAB;  Service: Cardiovascular;  Laterality: N/A;  . REVISON OF ARTERIOVENOUS FISTULA Left 10/05/2016   Procedure: REVISON OF left arm ARTERIOVENOUS FISTULA;  Surgeon: [name], MD;  Location: [place];  Service: Vascular;  Laterality: Left;  .  TONSILLECTOMY    . UMBILICAL HERNIA REPAIR  03/20/2012   Procedure: HERNIA REPAIR UMBILICAL ADULT;  Surgeon: [name], MD;  Location: [place];  Service: General;  Laterality: N/A;  . UMBILICAL HERNIA REPAIR  01/22/2013   preperitoneal open procedure due to significant adhesions/notes 01/22/2013  . VENTRAL HERNIA REPAIR N/A 01/22/2013   Procedure: ATTEMPTED LAPAROSCOPIC VENTRAL HERNIA CONVERTED TO OPEN;  Surgeon: [name], MD;  Location: [place];  Service: General;  Laterality: N/A;    Social History   Social History  . Marital status: Married     Spouse name: [name]  . Number of children: 3  . Years of education: 12   Occupational History  .      disabled   Social History Main Topics  . Smoking status: Never Smoker  . Smokeless tobacco: Never Used  . Alcohol use No  . Drug use: No  . Sexual activity: Yes   Other Topics Concern  . Not on file   Social History Narrative   Patient is married [place]) and lives at home with his wife and children.   Patient has three children.   Patient is in disability.   Patient has a high school education.   Patient is right-handed   Patient drinks very little soda.                Family History  Problem Relation Age of Onset  . Heart disease Mother   . Hyperlipidemia Mother   . Hypertension Mother   . Kidney disease Father   . Stroke Father   . Kidney disease Brother     Current Outpatient Prescriptions  Medication Sig Dispense Refill  . aspirin EC 81 MG EC tablet Take 1 tablet (81 mg total) by mouth daily.    [name] atorvastatin (LIPITOR) 40 MG tablet Take 40 mg by mouth every evening.     . calcium acetate (PHOSLO) 667 MG capsule Take 1,334 mg by mouth 3 (three) times daily with meals.   6  . metoprolol succinate (TOPROL-XL) 100 MG 24 hr tablet Take 100 mg by mouth daily. Take with or immediately following a meal.    . nitroGLYCERIN (NITROSTAT) 0.4 MG SL tablet Place 1 tablet (0.4 mg total) under the tongue every 5 (five) minutes x 3 doses as needed for chest pain. 30 tablet 12  . oxyCODONE-acetaminophen (ROXICET) 5-325 MG tablet Take 1 tablet by mouth every 6 (six) hours as needed for severe pain. 8 tablet 0  . SENSIPAR 30 MG tablet Take 30 mg by mouth every evening.   5   No current facility-administered medications for this visit.      Allergies  Allergen Reactions  . No Known Allergies     REVIEW OF SYSTEMS (negative unless checked):   Cardiac:  []  Chest pain or chest pressure? []  Shortness of breath upon activity? []  Shortness of breath when lying flat? []   Irregular heart rhythm?  Vascular:  []  Pain in calf, thigh, or hip brought on by walking? []  Pain in feet at night that wakes you up from your sleep? []  Blood clot in your veins? []  Leg swelling?  Pulmonary:  []  Oxygen at home? []  Productive cough? []  Wheezing?  Neurologic:  []  Sudden weakness in arms or legs? []  Sudden numbness in arms or legs? []  Sudden onset of difficult speaking or slurred speech? []  Temporary loss of vision in one eye? []  Problems with dizziness?  Gastrointestinal:  []  Blood in stool? []  Vomited blood?  Genitourinary:  []  Burning when urinating? []  Blood in urine? [x]  end stage renal disease-HD: M/W/F   Psychiatric:  []  Major depression  Hematologic:  []  Bleeding problems? []  Problems with blood clotting?  Dermatologic:  []  Rashes or ulcers?  Constitutional:  []  Fever or chills?  Ear/Nose/Throat:  []  Change in hearing? []  Nose bleeds? []  Sore throat?  Musculoskeletal:  []  Back pain? []  Joint pain? []  Muscle pain?    Physical Examination   Vitals:   12/21/16 1112  BP: 120/79  Pulse: 71  Resp: 20  Temp: (!) 97.4 F (36.3 C)  SpO2: 99%  Weight: 193 lb (87.5 kg)  Height: 5\' 8"  (1.727 m)   Body mass index is 29.35 kg/m.  General Alert, O x 3, WD, NAD  Pulmonary Sym exp, good B air movt, CTA B  Cardiac RRR, Nl S1, S2, no Murmurs, No rubs, No S3,S4  Vascular Vessel Right Left  Radial Palpable Faintly palpable  Brachial Palpable Palpable  Ulnar Not palpable Not palpable    Musculo- skeletal M/S 5/5 throughout  , Extremities without ischemic changes. 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 male who presents with ESRD requiring hemodialysis, contained rupture of left BC AVF   The L BC AVF is no longer salvageable.   I would ligate the L BC AVF and evacuate the hematoma, resecting any redundant skin.    At the same time, I would go ahead and place a L 1st BVT.  Normally, I would consider proceeding with a single stage BVT, but the ruptured L BC AVF sits in the route of such, so we need the arm to heal up prior to tunneling the BVT.  I had an extensive discussion with this patient in regards to the nature of access surgery, including risk, benefits, and alternatives.    The patient is aware that the risks of access surgery include but are not limited to: bleeding, infection, steal syndrome, nerve damage, ischemic monomelic neuropathy, failure of access to mature, and possible need for additional access procedures in the future.  The patient is aware that I construct transposition procedures in two stages, requiring a separate operation to complete the procedure.  The patient has  agreed to proceed with the above procedure which will be scheduled Tuesday 28 AUG 18.   Adele Barthel, MD, FACS [facility] [place] Office: [phone] Pager: [phone]

## 2016-12-27 NOTE — Transfer of Care (Signed)
Immediate Anesthesia Transfer of Care Note  Patient: Michael [name]  Procedure(s) Performed: Procedure(s): LIGATION/EXCISION OF LEFT UPPER ARM ARTERIOVENOUS  FISTULA;  EVACUATION OF HEMATOMA (Left) BASILIC VEIN TRANSPOSITION LEFT UPPER ARM FIRST STAGE (Left)  Patient Location: PACU  Anesthesia Type:General  Level of Consciousness: awake, oriented and patient cooperative  Airway & Oxygen Therapy: Patient Spontanous Breathing and Patient connected to nasal cannula oxygen  Post-op Assessment: Report given to RN and Post -op Vital signs reviewed and stable  Post vital signs: Reviewed  Last Vitals:  Vitals:   12/27/16 0832 12/27/16 1350  BP: 122/81 (!) (P) 122/91  Pulse: (!) 109 79  Resp: 18 15  Temp: 36.5 C 36.7 C  SpO2: 99% 97%    Last Pain:  Vitals:   12/27/16 1350  PainSc: (P) 0-No pain      Patients Stated Pain Goal: 5 (75/10/25 8527)  Complications: No apparent anesthesia complications

## 2016-12-27 NOTE — Interval H&P Note (Signed)
History and Physical Interval Note:  12/27/2016 10:08 AM  Michael [name]  has presented today for surgery, with the diagnosis of end stage renal disease  The various methods of treatment have been discussed with the patient and family. After consideration of risks, benefits and other options for treatment, the patient has consented to  Procedure(s): LIGATION OF ARTERIOVENOUS  FISTULA EVACUATION OF HEMATOMA (Left) BASILIC VEIN TRANSPOSITION FIRST STAGE (Left) as a surgical intervention .  The patient's history has been reviewed, patient examined, no change in status, stable for surgery.  I have reviewed the patient's chart and labs.  Questions were answered to the patient's satisfaction.     Adele Barthel

## 2016-12-27 NOTE — Anesthesia Postprocedure Evaluation (Signed)
Anesthesia Post Note  Patient: Michael [name]  Procedure(s) Performed: Procedure(s) (LRB): LIGATION/EXCISION OF LEFT UPPER ARM ARTERIOVENOUS  FISTULA;  EVACUATION OF HEMATOMA (Left) BASILIC VEIN TRANSPOSITION LEFT UPPER ARM FIRST STAGE (Left)     Patient location during evaluation: PACU Anesthesia Type: General Level of consciousness: awake and alert Pain management: pain level controlled Vital Signs Assessment: post-procedure vital signs reviewed and stable Respiratory status: spontaneous breathing, nonlabored ventilation, respiratory function stable and patient connected to nasal cannula oxygen Cardiovascular status: blood pressure returned to baseline and stable Postop Assessment: no signs of nausea or vomiting Anesthetic complications: no    Last Vitals:  Vitals:   12/27/16 1445 12/27/16 1500  BP: 110/65 110/65  Pulse: 66 66  Resp: 19 20  Temp:    SpO2: 93% 94%    Last Pain:  Vitals:   12/27/16 1350  PainSc: 0-No pain                 Tiajuana Amass

## 2016-12-27 NOTE — Anesthesia Procedure Notes (Signed)
Procedure Name: Intubation Date/Time: 12/27/2016 11:13 AM Performed by: Jenne Campus Pre-anesthesia Checklist: Patient identified, Emergency Drugs available, Suction available and Patient being monitored Patient Re-evaluated:Patient Re-evaluated prior to induction Oxygen Delivery Method: Circle System Utilized Preoxygenation: Pre-oxygenation with 100% oxygen Induction Type: IV induction Ventilation: Mask ventilation without difficulty, Two handed mask ventilation required and Oral airway inserted - appropriate to patient size Laryngoscope Size: Mac and 4 Grade View: Grade III Tube type: Oral Tube size: 7.5 mm Number of attempts: 1 Airway Equipment and Method: Stylet and Oral airway Placement Confirmation: ETT inserted through vocal cords under direct vision,  positive ETCO2 and breath sounds checked- equal and bilateral Secured at: 23 cm Tube secured with: Tape Dental Injury: Teeth and Oropharynx as per pre-operative assessment

## 2016-12-27 NOTE — Anesthesia Preprocedure Evaluation (Signed)
Anesthesia Evaluation  Patient identified by [name], date of birth, ID band Patient awake    Reviewed: Allergy & Precautions, NPO status , Patient's Chart, lab work & pertinent test results, reviewed documented beta blocker date and time   Airway Mallampati: II  TM Distance: >3 FB Neck ROM: Full    Dental  (+) Dental Advisory Given   Pulmonary sleep apnea ,    breath sounds clear to auscultation       Cardiovascular hypertension, Pt. on medications and Pt. on home beta blockers +CHF   Rhythm:Regular Rate:Normal     Neuro/Psych negative neurological ROS     GI/Hepatic negative GI ROS, Neg liver ROS,   Endo/Other  negative endocrine ROS  Renal/GU ESRFRenal disease     Musculoskeletal   Abdominal   Peds  Hematology negative hematology ROS (+)   Anesthesia Other Findings   Reproductive/Obstetrics                             Lab Results  Component Value Date   WBC 3.2 (L) 11/06/2015   HGB 14.3 12/27/2016   HCT 42.0 12/27/2016   MCV 88.5 11/06/2015   PLT 108 (L) 11/06/2015  ' Lab Results  Component Value Date   CREATININE 7.05 (H) 11/06/2015   BUN 26 (H) 11/06/2015   NA 137 12/27/2016   K 3.9 12/27/2016   CL 95 (L) 11/06/2015   CO2 31 11/06/2015    Anesthesia Physical Anesthesia Plan  ASA: III  Anesthesia Plan: General   Post-op Pain Management:    Induction: Intravenous  PONV Risk Score and Plan:   Airway Management Planned: Oral ETT  Additional Equipment:   Intra-op Plan:   Post-operative Plan: Extubation in OR  Informed Consent: I have reviewed the patients History and Physical, chart, labs and discussed the procedure including the risks, benefits and alternatives for the proposed anesthesia with the patient or authorized representative who has indicated his/her understanding and acceptance.   Dental advisory given  Plan Discussed with: CRNA  Anesthesia Plan  Comments:         Anesthesia Quick Evaluation

## 2016-12-27 NOTE — Op Note (Signed)
OPERATIVE NOTE   PROCEDURE: 1. left first stage basilic vein transposition (ulnar-basilic arteriovenous fistula) placement 2. Ligation and excision of left brachiocephalic arteriovenous fistula pseudoaneurysm  PRE-OPERATIVE DIAGNOSIS: ruptured left brachiocephalic arteriovenous fistula, end stage renal disease   POST-OPERATIVE DIAGNOSIS: same as above   SURGEON: Adele Barthel, MD  ASSISTANT(S): Gerri Lins, PAC   ANESTHESIA: general  ESTIMATED BLOOD LOSS: 100 cc  FINDING(S): 1.  Cubital vein: 3-4 mm, acceptable 2.  Brachial artery: aneurysmal 8-10 mm,  3.  Ulnar artery: 3-3.5 mm, minimal disease 4.  Venous outflow: faintly palpable thrill  4.  Radial flow: dopplerable radial signal  SPECIMEN(S):  none  INDICATIONS:   Michael [name] is a 61 y.o. male who presents with recurrent rupture of left brachiocephalic arteriovenous fistula despite pseudoaneurysm plication.  The patient is scheduled for I recommended: ligation and excision of left brachiocephalic arteriovenous fistula pseudoaneurysm to facilitate left first stage basilic vein transposition.  The patient is aware the risks include but are not limited to: bleeding, infection, steal syndrome, nerve damage, ischemic monomelic neuropathy, failure to mature, and need for additional procedures.  The patient is aware of the risks of the procedure and elects to proceed forward.   DESCRIPTION: After full informed written consent was obtained from the patient, the patient was brought back to the operating room and placed supine upon the operating table.  Prior to induction, the patient received IV antibiotics.   After obtaining adequate anesthesia, the patient was then prepped and draped in the standard fashion for a left arm access procedure.    I marked out the skin incision for resection of redundant skin overlying the pseudoaneurysm and also marked out fistula distal to the pseudoaneurysm and the location of the brachial  artery.  I could also see the cubital vein that was draining into the basilic vein adjacent to the exposure site of the brachial artery.  I made a longitudinal incision at the level of the antecubitum and dissected through the subcutaneous tissue and fascia to gain exposure of the brachial artery.  I took care to avoid injurying the cubital vein.  I dissected out the brachiocephalic fistula anastomosis and placed two 0 silk ties around the distal fistula and tied off the fistula.  I then transected the fistula.  I oversewed the distal residual vein on the brachial artery with a double layer of 5-0 Prolene.    I then made an incision over the fistula distal to the pseudoaneurysm.  I dissected out the fistula at this location.  I again ligated this fistula with two 0 silk ties and then transected the fistula.    At this point, I made an incision in the skin around the pseudoaneurysm in an elliptical fashion.  I then dissected down to the pseudoaneurysm with electrocautery.  I was able to skeletonize the pseudoaneurysm.  I made an incision in the pseudoaneurysm's anterior wall and evacuated the thrombus and blood with in the pseudoaneurysm.  I then dissected free the entire pseudoaneurysmal segment of the fistula. I packed the surgical wound with Avitene and held pressure for a few minutes.  Meanwhile, I dissected out the brachial artery.  It appeared to be aneurysm 8-10 mm in diameter.  I had concerns with using this for inflow to the new fistula.  Fortunately, the ulnar artery appeared to be adjacent.  This appeared to be 3-4 mm in diameter without disease, so I elected to use this as outflow.  This was dissected out and vessel  loops applied.  I also dissected out the cubital vein which I was planning on using to construct the fistula.  At this point, I removed the Avitene from the pseudoaneurysm excision wound.  I controlled a few bleeding points with electrocautery.  I reapproximated the deep tissue with  a running stitch of 2-0 Vicryl.  The subdermal tissue was reapproximated with a 3-0 Vicryl.  The skin was reapproximated with staples to allow any residual bleeding to decompress.  At this point, I turned my attention back to the cubital vein and ulnar artery.  The distal segment of the vein was ligated with a  2-0 silk, and the vein was transected.  The proximal segment was interrogated with serial dilators.  The vein accepted up to a 4.5 mm dilator without any difficulty.  I then instilled the heparinized saline into the vein and clamped it.  At this point, I reset my exposure of the ulnar artery and placed the artery under tension proximally and distally.  I made an arteriotomy with a #11 blade, and then I extended the arteriotomy with a Potts scissor.  I injected heparinized saline proximal and distal to this arteriotomy.  The vein was then sewn to the artery in an end-to-side configuration with a running stitch of 7-0 Prolene.  Prior to completing this anastomosis, I allowed the vein and artery to backbleed.  There was no evidence of clot from any vessels.  I completed the anastomosis in the usual fashion and then released all vessel loops and clamps.    There was a faintly palpable thrill in the venous outflow, and there was a dopplerable radial signal.  At this point, I irrigated out the surgical wound.  There was no further active bleeding.  The subcutaneous tissue was reapproximated with a running stitch of 3-0 Vicryl.  The skin was then reapproximated with a running subcuticular stitch of 4-0 Vicryl.  The skin was then cleaned, dried, and reinforced with Dermabond.  The patient tolerated this procedure well.    COMPLICATIONS: none  CONDITION: stable   Adele Barthel, MD, [facility] [place] Office: [phone] Pager: [phone]  12/27/2016, 1:41 PM

## 2016-12-28 ENCOUNTER — Encounter (HOSPITAL_COMMUNITY): Payer: Self-pay | Admitting: Vascular Surgery

## 2016-12-29 ENCOUNTER — Telehealth: Payer: Self-pay | Admitting: Vascular Surgery

## 2016-12-29 DIAGNOSIS — N186 End stage renal disease: Secondary | ICD-10-CM | POA: Diagnosis not present

## 2016-12-29 DIAGNOSIS — N2581 Secondary hyperparathyroidism of renal origin: Secondary | ICD-10-CM | POA: Diagnosis not present

## 2016-12-29 NOTE — Telephone Encounter (Signed)
Sched staple removal 01/13/17 at 10:15. Sched MD appt 01/27/17 at 11:00. Lm on cell#.

## 2016-12-29 NOTE — Telephone Encounter (Signed)
-----   Message from Mena Goes, RN sent at 12/27/2016  2:19 PM EDT ----- Regarding: 2 postop appts needed   ----- Message ----- From: Conrad [place], MD Sent: 12/27/2016   1:57 PM To: Vvs Charge Pool  Michael [name] [id number] 1956-04-05  PROCEDURE: left first stage basilic vein transposition (ulnar-basilic arteriovenous fistula) placement Ligation and excision of left brachiocephalic arteriovenous fistula pseudoaneurysm  Asst: Gerri Lins, [facility]   Follow-up:  1.  10-14 days for staple removal L arm 2.  4 weeks with MD

## 2016-12-30 DIAGNOSIS — Q612 Polycystic kidney, adult type: Secondary | ICD-10-CM | POA: Diagnosis not present

## 2016-12-30 DIAGNOSIS — N186 End stage renal disease: Secondary | ICD-10-CM | POA: Diagnosis not present

## 2016-12-30 DIAGNOSIS — N2581 Secondary hyperparathyroidism of renal origin: Secondary | ICD-10-CM | POA: Diagnosis not present

## 2016-12-30 DIAGNOSIS — Z992 Dependence on renal dialysis: Secondary | ICD-10-CM | POA: Diagnosis not present

## 2017-01-02 DIAGNOSIS — Z23 Encounter for immunization: Secondary | ICD-10-CM | POA: Diagnosis not present

## 2017-01-02 DIAGNOSIS — N2581 Secondary hyperparathyroidism of renal origin: Secondary | ICD-10-CM | POA: Diagnosis not present

## 2017-01-02 DIAGNOSIS — N186 End stage renal disease: Secondary | ICD-10-CM | POA: Diagnosis not present

## 2017-01-04 DIAGNOSIS — N2581 Secondary hyperparathyroidism of renal origin: Secondary | ICD-10-CM | POA: Diagnosis not present

## 2017-01-04 DIAGNOSIS — N186 End stage renal disease: Secondary | ICD-10-CM | POA: Diagnosis not present

## 2017-01-04 DIAGNOSIS — Z23 Encounter for immunization: Secondary | ICD-10-CM | POA: Diagnosis not present

## 2017-01-06 DIAGNOSIS — N2581 Secondary hyperparathyroidism of renal origin: Secondary | ICD-10-CM | POA: Diagnosis not present

## 2017-01-06 DIAGNOSIS — Z23 Encounter for immunization: Secondary | ICD-10-CM | POA: Diagnosis not present

## 2017-01-06 DIAGNOSIS — N186 End stage renal disease: Secondary | ICD-10-CM | POA: Diagnosis not present

## 2017-01-09 DIAGNOSIS — N186 End stage renal disease: Secondary | ICD-10-CM | POA: Diagnosis not present

## 2017-01-09 DIAGNOSIS — N2581 Secondary hyperparathyroidism of renal origin: Secondary | ICD-10-CM | POA: Diagnosis not present

## 2017-01-09 DIAGNOSIS — Z23 Encounter for immunization: Secondary | ICD-10-CM | POA: Diagnosis not present

## 2017-01-11 DIAGNOSIS — N2581 Secondary hyperparathyroidism of renal origin: Secondary | ICD-10-CM | POA: Diagnosis not present

## 2017-01-11 DIAGNOSIS — Z23 Encounter for immunization: Secondary | ICD-10-CM | POA: Diagnosis not present

## 2017-01-11 DIAGNOSIS — N186 End stage renal disease: Secondary | ICD-10-CM | POA: Diagnosis not present

## 2017-01-13 ENCOUNTER — Ambulatory Visit (INDEPENDENT_AMBULATORY_CARE_PROVIDER_SITE_OTHER): Payer: Self-pay | Admitting: Family

## 2017-01-13 ENCOUNTER — Encounter: Payer: Self-pay | Admitting: Family

## 2017-01-13 VITALS — BP 123/88 | HR 90 | Temp 98.0°F | Resp 18 | Ht 66.0 in | Wt 191.0 lb

## 2017-01-13 DIAGNOSIS — N186 End stage renal disease: Secondary | ICD-10-CM

## 2017-01-13 DIAGNOSIS — T82898D Other specified complication of vascular prosthetic devices, implants and grafts, subsequent encounter: Secondary | ICD-10-CM

## 2017-01-13 DIAGNOSIS — T82510D Breakdown (mechanical) of surgically created arteriovenous fistula, subsequent encounter: Secondary | ICD-10-CM

## 2017-01-13 DIAGNOSIS — Z992 Dependence on renal dialysis: Secondary | ICD-10-CM

## 2017-01-13 DIAGNOSIS — Z4802 Encounter for removal of sutures: Secondary | ICD-10-CM

## 2017-01-13 NOTE — Progress Notes (Signed)
Postoperative Access Visit   History of Present Illness  Michael [name] is a 61 y.o. year old male who presents for postoperative follow-up for: left first stage basilic vein transposition (ulnar-basilic arteriovenous fistula) placement, and ligation and excision of left brachiocephalic arteriovenous fistula pseudoaneurysm on 12-27-16 by Dr. Bridgett Larsson. The pt had recurrent rupture of left brachiocephalic arteriovenous fistula despite pseudoaneurysm plication. Dr. Bridgett Larsson recommended ligation and excision of left brachiocephalic arteriovenous fistula pseudoaneurysm to facilitate left first stage basilic vein transposition.  He returns today for staples removal.  Pt has a 4 week follow up appointment scheduled with Dr. Bridgett Larsson on 01-27-17.   He is right hand dominant.   He dialyzes M-W-F at [facility] via right upper chest catheter.    He states he currently has sinus and cold sx's with cough and sinus congestion.   The patient's left upper arm incision is healed, staples in place.  The patient notes no steal symptoms.  The patient is able to complete their activities of daily living.     For VQI Use Only  PRE-ADM LIVING: Home  AMB STATUS: Ambulatory    Past Medical History:  Diagnosis Date  . Dyspnea    on exertion  . ESRD (end stage renal disease) on dialysis [facility])    Started dialysis Feb 2014, gets HD MWF at [facility]. Saw Dr Moshe Cipro prior to starting HD.  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 All except for one of his father's siblings had dialysis.  Has one brother with renal transplant, esrd also due to cystic kidney disease.    . Fatigue   . Hyperlipidemia   . Hyperparathyroidism, secondary renal ([place])   . Hypertension   . Hypoxemia 12/12/2013  . Kidney stones    pased one, [place]   . Nonischemic cardiomyopathy ([place])    Er 25% 2015, 55 % 2013  . OSA on CPAP    uses cpap setting of 4  . OSA on CPAP 03/24/2014  . Polycystic kidney  disease, autosomal dominant   . Ventricular tachycardia//Freq PVCs     Past Surgical History:  Procedure Laterality Date  . APPENDECTOMY    . AV FISTULA PLACEMENT  12/05/2011   Procedure: ARTERIOVENOUS (AV) FISTULA CREATION;LLEFT ARM  Surgeon: Conrad L[place], MD;  Location: [place];  Service: Vascular;  Laterality: Left;  RADIO-CEPHALIC  fistula left arm  . AV FISTULA PLACEMENT  01/11/2012   Procedure: ARTERIOVENOUS (AV) FISTULA CREATION;  Surgeon: Conrad [place], MD;  Location: [place];  Service: Vascular;  Laterality: Left;  Creation of left brachial cephalic arteriovenous fistula  . BASCILIC VEIN TRANSPOSITION Left 12/27/2016   Procedure: BASILIC VEIN TRANSPOSITION LEFT UPPER ARM FIRST STAGE;  Surgeon: Conrad Stroud, MD;  Location: [place];  Service: Vascular;  Laterality: Left;  . CARDIAC CATHETERIZATION  04-05-2010   checking for blockage but none-[facility]  . CYSTOSCOPY W/ STONE MANIPULATION     "laser once" (01/22/2013)  . HERNIA REPAIR    . INGUINAL HERNIA REPAIR Right 11/06/2015   Procedure: OPEN HERNIA REPAIR  RIGHT INGUINAL ADULT;  Surgeon: [name], MD;  Location: [facility];  Service: General;  Laterality: Right;  with MESH  . INSERTION OF DIALYSIS CATHETER Right 10/05/2016   Procedure: INSERTION OF right internal jugular DIALYSIS CATHETER;  Surgeon: [name], MD;  Location: [place];  Service: Vascular;  Laterality: Right;  . INSERTION OF MESH  03/20/2012   Procedure: INSERTION OF  MESH;  UMB Surgeon: [name], MD;  Location: [place];  Service: General;  Laterality: N/A;  . INSERTION OF MESH N/A 01/22/2013   Procedure: INSERTION OF MESH;  Surgeon: [name], MD;  Location: [place];  Service: General;  Laterality: N/A;  . LAPAROTOMY  04/02/2012   Procedure: EXPLORATORY LAPAROTOMY;  Surgeon: [name], MD;  Location: [place];  Service: General;  Laterality: N/A;  Exploratory Laparotomy with resection of small intestine  . LEFT HEART CATHETERIZATION WITH CORONARY ANGIOGRAM N/A  05/14/2013   Procedure: LEFT HEART CATHETERIZATION WITH CORONARY ANGIOGRAM;  Surgeon: Sinclair Grooms, MD;  [facility];  Service: Cardiovascular;  Laterality: N/A;  . LIGATION OF ARTERIOVENOUS  FISTULA Left 12/27/2016   Procedure: LIGATION/EXCISION OF LEFT UPPER ARM ARTERIOVENOUS  FISTULA;  EVACUATION OF HEMATOMA;  Surgeon: Conrad Philomath, MD;  Location: [place];  Service: Vascular;  Laterality: Left;  . REVISON OF ARTERIOVENOUS FISTULA Left 10/05/2016   Procedure: REVISON OF left arm ARTERIOVENOUS FISTULA;  Surgeon: [name], MD;  Location: [place];  Service: Vascular;  Laterality: Left;  . TONSILLECTOMY    . UMBILICAL HERNIA REPAIR  03/20/2012   Procedure: HERNIA REPAIR UMBILICAL ADULT;  Surgeon: [name], MD;  Location: [place];  Service: General;  Laterality: N/A;  . UMBILICAL HERNIA REPAIR  01/22/2013   preperitoneal open procedure due to significant adhesions/notes 01/22/2013  . VENTRAL HERNIA REPAIR N/A 01/22/2013   Procedure: ATTEMPTED LAPAROSCOPIC VENTRAL HERNIA CONVERTED TO OPEN;  Surgeon: [name], MD;  Location: [place];  Service: General;  Laterality: N/A;    Social History   Social History  . Marital status: Married    Spouse name: Michael [name]  . Number of children: 3  . Years of education: 12   Occupational History  .      disabled   Social History Main Topics  . Smoking status: Never Smoker  . Smokeless tobacco: Never Used  . Alcohol use No  . Drug use: No  . Sexual activity: Yes   Other Topics Concern  . Not on file   Social History Narrative   Patient is married [place]) and lives at home with his wife and children.   Patient has three children.   Patient is in disability.   Patient has a high school education.   Patient is right-handed   Patient drinks very little soda.                Allergies  Allergen Reactions  . No Known Allergies     Current Outpatient Prescriptions on File Prior to Visit  Medication Sig Dispense Refill  .  aspirin EC 81 MG EC tablet Take 1 tablet (81 mg total) by mouth daily.    [name] atorvastatin (LIPITOR) 40 MG tablet Take 40 mg by mouth every evening.     . calcium acetate (PHOSLO) 667 MG capsule Take 1,334 mg by mouth 3 (three) times daily with meals.   6  . metoprolol succinate (TOPROL-XL) 100 MG 24 hr tablet Take 100 mg by mouth daily. Take with or immediately following a meal.    . nitroGLYCERIN (NITROSTAT) 0.4 MG SL tablet Place 1 tablet (0.4 mg total) under the tongue every 5 (five) minutes x 3 doses as needed for chest pain. 30 tablet 12  . oxyCODONE-acetaminophen (PERCOCET/ROXICET) 5-325 MG tablet Take 1 tablet by mouth every 4 (four) hours as needed. 20 tablet 0  . SENSIPAR 30 MG tablet Take 30 mg by mouth  every evening.   5   No current facility-administered medications on file prior to visit.      Physical Examination Vitals:   01/13/17 1014  BP: 123/88  Pulse: 90  Resp: 18  Temp: 98 F (36.7 C)  TempSrc: Oral  SpO2: 96%  Weight: 191 lb (86.6 kg)  Height: 5\' 6"  (1.676 m)   Body mass index is 30.83 kg/m.  Left upper arm incision is healed, skin feels warm and normal, hand grip is 5/5, sensation in digits is intact, no palpable thrill, bruit can not be auscultated.  Medical Decision Making  Michael [name] is a 61 y.o. year old male who presents s/p left first stage basilic vein transposition (ulnar-basilic arteriovenous fistula) placement, and ligation and excision of left brachiocephalic arteriovenous fistula pseudoaneurysm on 12-27-16.  All staples removed today and steri strips applied to the left upper arm incision.    Follow up with Dr. Bridgett Larsson on 01-27-17 as already scheduled. Pt will need the second stage  basilic vein transposition.   Thank you for allowing [place] to participate in this patient's care.  Michael [name], Sharmon Leyden, RN, MSN, FNP-C [facility] [place] Office: [phone]  01/13/2017, 10:37 AM  Clinic MD: Bridgett Larsson    Follow-up:  1.  10-14  days for staple removal L arm 2.  4 weeks with MD

## 2017-01-14 DIAGNOSIS — N186 End stage renal disease: Secondary | ICD-10-CM | POA: Diagnosis not present

## 2017-01-14 DIAGNOSIS — Z23 Encounter for immunization: Secondary | ICD-10-CM | POA: Diagnosis not present

## 2017-01-14 DIAGNOSIS — N2581 Secondary hyperparathyroidism of renal origin: Secondary | ICD-10-CM | POA: Diagnosis not present

## 2017-01-16 DIAGNOSIS — N186 End stage renal disease: Secondary | ICD-10-CM | POA: Diagnosis not present

## 2017-01-16 DIAGNOSIS — N2581 Secondary hyperparathyroidism of renal origin: Secondary | ICD-10-CM | POA: Diagnosis not present

## 2017-01-16 DIAGNOSIS — Z23 Encounter for immunization: Secondary | ICD-10-CM | POA: Diagnosis not present

## 2017-01-18 DIAGNOSIS — N2581 Secondary hyperparathyroidism of renal origin: Secondary | ICD-10-CM | POA: Diagnosis not present

## 2017-01-18 DIAGNOSIS — Z23 Encounter for immunization: Secondary | ICD-10-CM | POA: Diagnosis not present

## 2017-01-18 DIAGNOSIS — N186 End stage renal disease: Secondary | ICD-10-CM | POA: Diagnosis not present

## 2017-01-20 DIAGNOSIS — Z23 Encounter for immunization: Secondary | ICD-10-CM | POA: Diagnosis not present

## 2017-01-20 DIAGNOSIS — N2581 Secondary hyperparathyroidism of renal origin: Secondary | ICD-10-CM | POA: Diagnosis not present

## 2017-01-20 DIAGNOSIS — N186 End stage renal disease: Secondary | ICD-10-CM | POA: Diagnosis not present

## 2017-01-23 DIAGNOSIS — N186 End stage renal disease: Secondary | ICD-10-CM | POA: Diagnosis not present

## 2017-01-23 DIAGNOSIS — Z23 Encounter for immunization: Secondary | ICD-10-CM | POA: Diagnosis not present

## 2017-01-23 DIAGNOSIS — N2581 Secondary hyperparathyroidism of renal origin: Secondary | ICD-10-CM | POA: Diagnosis not present

## 2017-01-23 NOTE — Progress Notes (Signed)
Postoperative Access Visit   History of Present Illness   Michael [name] is a 61 y.o. year old male who presents for postoperative follow-up for: left first stage basilic vein transposition, ligation and excision of left brachiocephalic arteriovenous fistula pseudoaneurysm (Date: 12/27/16).  The patient's wounds are healed.  The patient notes no steal symptoms.  The patient is able to complete their activities of daily living.  The patient's current symptoms are: none.  Past Medical History:  Diagnosis Date  . Dyspnea    on exertion  . ESRD (end stage renal disease) on dialysis [facility])    Started dialysis Feb 2014, gets HD MWF at [facility]. Saw Dr Moshe Cipro prior to starting HD.  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 All except for one of his father's siblings had dialysis.  Has one brother with renal transplant, esrd also due to cystic kidney disease.    . Fatigue   . Hyperlipidemia   . Hyperparathyroidism, secondary renal ([place])   . Hypertension   . Hypoxemia 12/12/2013  . Kidney stones    pased one, [place]   . Nonischemic cardiomyopathy ([place])    Er 25% 2015, 55 % 2013  . OSA on CPAP    uses cpap setting of 4  . OSA on CPAP 03/24/2014  . Polycystic kidney disease, autosomal dominant   . Ventricular tachycardia//Freq PVCs     Past Surgical History:  Procedure Laterality Date  . APPENDECTOMY    . AV FISTULA PLACEMENT  12/05/2011   Procedure: ARTERIOVENOUS (AV) FISTULA CREATION;LLEFT ARM  Surgeon: Conrad [place], MD;  Location: [place];  Service: Vascular;  Laterality: Left;  RADIO-CEPHALIC  fistula left arm  . AV FISTULA PLACEMENT  01/11/2012   Procedure: ARTERIOVENOUS (AV) FISTULA CREATION;  Surgeon: Conrad [place], MD;  Location: [place];  Service: Vascular;  Laterality: Left;  Creation of left brachial cephalic arteriovenous fistula  . BASCILIC VEIN TRANSPOSITION Left 12/27/2016   Procedure: BASILIC VEIN TRANSPOSITION LEFT UPPER ARM FIRST STAGE;  Surgeon:  Conrad [place], MD;  Location: [place];  Service: Vascular;  Laterality: Left;  . CARDIAC CATHETERIZATION  04-05-2010   checking for blockage but none-[facility]  . CYSTOSCOPY W/ STONE MANIPULATION     "laser once" (01/22/2013)  . HERNIA REPAIR    . INGUINAL HERNIA REPAIR Right 11/06/2015   Procedure: OPEN HERNIA REPAIR  RIGHT INGUINAL ADULT;  Surgeon: [name], MD;  Location: [facility];  Service: General;  Laterality: Right;  with MESH  . INSERTION OF DIALYSIS CATHETER Right 10/05/2016   Procedure: INSERTION OF right internal jugular DIALYSIS CATHETER;  Surgeon: [name], MD;  Location: [place];  Service: Vascular;  Laterality: Right;  . INSERTION OF MESH  03/20/2012   Procedure: INSERTION OF MESH;  UMB Surgeon: [name], MD;  Location: [place];  Service: General;  Laterality: N/A;  . INSERTION OF MESH N/A 01/22/2013   Procedure: INSERTION OF MESH;  Surgeon: [name], MD;  Location: [place];  Service: General;  Laterality: N/A;  . LAPAROTOMY  04/02/2012   Procedure: EXPLORATORY LAPAROTOMY;  Surgeon: [name], MD;  Location: [place];  Service: General;  Laterality: N/A;  Exploratory Laparotomy with resection of small intestine  . LEFT HEART CATHETERIZATION WITH CORONARY ANGIOGRAM N/A 05/14/2013   Procedure: LEFT HEART CATHETERIZATION WITH CORONARY ANGIOGRAM;  Surgeon: Sinclair Grooms, MD;  Location: [facility] CATH LAB;  Service: Cardiovascular;  Laterality: N/A;  .  LIGATION OF ARTERIOVENOUS  FISTULA Left 12/27/2016   Procedure: LIGATION/EXCISION OF LEFT UPPER ARM ARTERIOVENOUS  FISTULA;  EVACUATION OF HEMATOMA;  Surgeon: Conrad Boone, MD;  Location: [place];  Service: Vascular;  Laterality: Left;  . REVISON OF ARTERIOVENOUS FISTULA Left 10/05/2016   Procedure: REVISON OF left arm ARTERIOVENOUS FISTULA;  Surgeon: [name], MD;  Location: [place];  Service: Vascular;  Laterality: Left;  . TONSILLECTOMY    . UMBILICAL HERNIA REPAIR  03/20/2012   Procedure: HERNIA REPAIR UMBILICAL ADULT;   Surgeon: [name], MD;  Location: [place];  Service: General;  Laterality: N/A;  . UMBILICAL HERNIA REPAIR  01/22/2013   preperitoneal open procedure due to significant adhesions/notes 01/22/2013  . VENTRAL HERNIA REPAIR N/A 01/22/2013   Procedure: ATTEMPTED LAPAROSCOPIC VENTRAL HERNIA CONVERTED TO OPEN;  Surgeon: [name], MD;  Location: [place];  Service: General;  Laterality: N/A;    Social History   Social History  . Marital status: Married    Spouse name: Michael [name]  . Number of children: 3  . Years of education: 12   Occupational History  .      disabled   Social History Main Topics  . Smoking status: Never Smoker  . Smokeless tobacco: Never Used  . Alcohol use No  . Drug use: No  . Sexual activity: Yes   Other Topics Concern  . Not on file   Social History Narrative   Patient is married [place]) and lives at home with his wife and children.   Patient has three children.   Patient is in disability.   Patient has a high school education.   Patient is right-handed   Patient drinks very little soda.                 Family History  Problem Relation Age of Onset  . Heart disease Mother   . Hyperlipidemia Mother   . Hypertension Mother   . Kidney disease Father   . Stroke Father   . Kidney disease Brother     Current Outpatient Prescriptions  Medication Sig Dispense Refill  . aspirin EC 81 MG EC tablet Take 1 tablet (81 mg total) by mouth daily.    [name] atorvastatin (LIPITOR) 40 MG tablet Take 40 mg by mouth every evening.     . calcium acetate (PHOSLO) 667 MG capsule Take 1,334 mg by mouth 3 (three) times daily with meals.   6  . metoprolol succinate (TOPROL-XL) 100 MG 24 hr tablet Take 100 mg by mouth daily. Take with or immediately following a meal.    . nitroGLYCERIN (NITROSTAT) 0.4 MG SL tablet Place 1 tablet (0.4 mg total) under the tongue every 5 (five) minutes x 3 doses as needed for chest pain. 30 tablet 12  . oxyCODONE-acetaminophen  (PERCOCET/ROXICET) 5-325 MG tablet Take 1 tablet by mouth every 4 (four) hours as needed. 20 tablet 0  . SENSIPAR 30 MG tablet Take 30 mg by mouth every evening.   5   No current facility-administered medications for this visit.      Allergies  Allergen Reactions  . No Known Allergies     REVIEW OF SYSTEMS (negative unless checked):   Cardiac:  []  Chest pain or chest pressure? []  Shortness of breath upon activity? []  Shortness of breath when lying flat? []  Irregular heart rhythm?  Vascular:  []  Pain in calf, thigh, or hip brought on by walking? []  Pain in feet at night that wakes you up from  your sleep? []  Blood clot in your veins? []  Leg swelling?  Pulmonary:  []  Oxygen at home? []  Productive cough? []  Wheezing?  Neurologic:  []  Sudden weakness in arms or legs? []  Sudden numbness in arms or legs? []  Sudden onset of difficult speaking or slurred speech? []  Temporary loss of vision in one eye? []  Problems with dizziness?  Gastrointestinal:  []  Blood in stool? []  Vomited blood?  Genitourinary:  []  Burning when urinating? []  Blood in urine? [x]  End stage renal disease-HD: M/W/F  Psychiatric:  []  Major depression  Hematologic:  []  Bleeding problems? []  Problems with blood clotting?  Dermatologic:  []  Rashes or ulcers?  Constitutional:  []  Fever or chills?  Ear/Nose/Throat:  []  Change in hearing? []  Nose bleeds? []  Sore throat?  Musculoskeletal:  []  Back pain? []  Joint pain? []  Muscle pain?   Physical Examination   Vitals:   01/27/17 1043  BP: 110/77  Pulse: 97  SpO2: 99%  Weight: 186 lb (84.4 kg)  Height: 5\' 8"  (1.727 m)   Body mass index is 28.28 kg/m.   Pulmonary Sym exp, good air movt, CTAB, no rales, rhonchi, & wheezing  Cardiac RRR, Nl S1, S2, no Murmurs, rubs or gallops   left arm Incisions are healed, skin feels warm, hand grip is 5/5, sensation in digits is intact, palpable thrill, bruit can be auscultated, on Sonosite: most of  BVT is 6 mm, distal tapers down to 4-5 mm    Medical Decision Making   Jeydan Barner is a 61 y.o. year old male who presents s/p left first stage basilic vein transposition, ligation and excision of left brachiocephalic arteriovenous fistula pseudoaneurysm    L 1st stage BVT is ready for transposition.  Will schedule this on 2 OCT 18.  Thank you for allowing [place] to participate in this patient's care.   Adele Barthel, MD, FACS [facility] [place] Office: [phone] Pager: [phone]

## 2017-01-25 DIAGNOSIS — Z23 Encounter for immunization: Secondary | ICD-10-CM | POA: Diagnosis not present

## 2017-01-25 DIAGNOSIS — N2581 Secondary hyperparathyroidism of renal origin: Secondary | ICD-10-CM | POA: Diagnosis not present

## 2017-01-25 DIAGNOSIS — N186 End stage renal disease: Secondary | ICD-10-CM | POA: Diagnosis not present

## 2017-01-27 ENCOUNTER — Other Ambulatory Visit: Payer: Self-pay | Admitting: *Deleted

## 2017-01-27 ENCOUNTER — Encounter: Payer: Self-pay | Admitting: Vascular Surgery

## 2017-01-27 ENCOUNTER — Ambulatory Visit (INDEPENDENT_AMBULATORY_CARE_PROVIDER_SITE_OTHER): Payer: Self-pay | Admitting: Vascular Surgery

## 2017-01-27 VITALS — BP 110/77 | HR 97 | Ht 68.0 in | Wt 186.0 lb

## 2017-01-27 DIAGNOSIS — Z992 Dependence on renal dialysis: Secondary | ICD-10-CM

## 2017-01-27 DIAGNOSIS — N2581 Secondary hyperparathyroidism of renal origin: Secondary | ICD-10-CM | POA: Diagnosis not present

## 2017-01-27 DIAGNOSIS — N186 End stage renal disease: Secondary | ICD-10-CM | POA: Diagnosis not present

## 2017-01-27 DIAGNOSIS — Z23 Encounter for immunization: Secondary | ICD-10-CM | POA: Diagnosis not present

## 2017-01-29 DIAGNOSIS — N186 End stage renal disease: Secondary | ICD-10-CM | POA: Diagnosis not present

## 2017-01-29 DIAGNOSIS — Z992 Dependence on renal dialysis: Secondary | ICD-10-CM | POA: Diagnosis not present

## 2017-01-29 DIAGNOSIS — Q612 Polycystic kidney, adult type: Secondary | ICD-10-CM | POA: Diagnosis not present

## 2017-01-30 ENCOUNTER — Encounter (HOSPITAL_COMMUNITY): Payer: Self-pay | Admitting: *Deleted

## 2017-01-30 DIAGNOSIS — N186 End stage renal disease: Secondary | ICD-10-CM | POA: Diagnosis not present

## 2017-01-30 DIAGNOSIS — N2581 Secondary hyperparathyroidism of renal origin: Secondary | ICD-10-CM | POA: Diagnosis not present

## 2017-01-30 NOTE — Anesthesia Preprocedure Evaluation (Addendum)
Anesthesia Evaluation  Patient identified by [name], date of birth, ID band Patient awake    Reviewed: Allergy & Precautions, NPO status , Patient's Chart, lab work & pertinent test results, reviewed documented beta blocker date and time   Airway Mallampati: II  TM Distance: >3 FB Neck ROM: Full    Dental no notable dental hx. (+) Teeth Intact, Caps   Pulmonary shortness of breath and with exertion, sleep apnea and Continuous Positive Airway Pressure Ventilation , pneumonia, resolved,    Pulmonary exam normal breath sounds clear to auscultation       Cardiovascular hypertension, Pt. on medications and Pt. on home beta blockers Normal cardiovascular exam Rhythm:Regular Rate:Normal  2nd stage basilic vein transposition Non ischemic CM on last echo 2015 LVEF 25-30%   Neuro/Psych negative neurological ROS  negative psych ROS   GI/Hepatic negative GI ROS, Neg liver ROS,   Endo/Other  Hyperlipidemia   Renal/GU Dialysis and ESRFRenal diseaseLast dialysis yesterday. Labs today WNL  negative genitourinary   Musculoskeletal negative musculoskeletal ROS (+)   Abdominal   Peds  Hematology  (+) anemia ,   Anesthesia Other Findings   Reproductive/Obstetrics                            Anesthesia Physical Anesthesia Plan  ASA: IV  Anesthesia Plan: General   Post-op Pain Management:    Induction: Intravenous  PONV Risk Score and Plan: 3 and Ondansetron, Dexamethasone, Midazolam and Propofol infusion  Airway Management Planned: LMA  Additional Equipment:   Intra-op Plan:   Post-operative Plan: Extubation in OR  Informed Consent: I have reviewed the patients History and Physical, chart, labs and discussed the procedure including the risks, benefits and alternatives for the proposed anesthesia with the patient or authorized representative who has indicated his/her understanding and acceptance.    Dental advisory given  Plan Discussed with: CRNA, Anesthesiologist and Surgeon  Anesthesia Plan Comments:        Anesthesia Quick Evaluation

## 2017-01-31 ENCOUNTER — Ambulatory Visit (HOSPITAL_COMMUNITY): Payer: Medicare Other | Admitting: Anesthesiology

## 2017-01-31 ENCOUNTER — Encounter (HOSPITAL_COMMUNITY)
Admission: RE | Disposition: A | Discharge status: Home / Self Care | Payer: Self-pay | Source: Ambulatory Visit | Attending: Vascular Surgery

## 2017-01-31 ENCOUNTER — Encounter (HOSPITAL_COMMUNITY): Payer: Self-pay

## 2017-01-31 ENCOUNTER — Ambulatory Visit (HOSPITAL_COMMUNITY)
Admission: RE | Admit: 2017-01-31 | Discharge: 2017-01-31 | Disposition: A | Discharge status: Home / Self Care | Payer: Medicare Other | Source: Ambulatory Visit | Attending: Vascular Surgery | Admitting: Vascular Surgery

## 2017-01-31 DIAGNOSIS — I429 Cardiomyopathy, unspecified: Secondary | ICD-10-CM | POA: Insufficient documentation

## 2017-01-31 DIAGNOSIS — E785 Hyperlipidemia, unspecified: Secondary | ICD-10-CM | POA: Diagnosis not present

## 2017-01-31 DIAGNOSIS — G4733 Obstructive sleep apnea (adult) (pediatric): Secondary | ICD-10-CM | POA: Diagnosis not present

## 2017-01-31 DIAGNOSIS — Z7982 Long term (current) use of aspirin: Secondary | ICD-10-CM | POA: Diagnosis not present

## 2017-01-31 DIAGNOSIS — N185 Chronic kidney disease, stage 5: Secondary | ICD-10-CM | POA: Diagnosis not present

## 2017-01-31 DIAGNOSIS — N2581 Secondary hyperparathyroidism of renal origin: Secondary | ICD-10-CM | POA: Insufficient documentation

## 2017-01-31 DIAGNOSIS — N186 End stage renal disease: Secondary | ICD-10-CM | POA: Insufficient documentation

## 2017-01-31 DIAGNOSIS — Z79899 Other long term (current) drug therapy: Secondary | ICD-10-CM | POA: Diagnosis not present

## 2017-01-31 DIAGNOSIS — I120 Hypertensive chronic kidney disease with stage 5 chronic kidney disease or end stage renal disease: Secondary | ICD-10-CM | POA: Diagnosis not present

## 2017-01-31 DIAGNOSIS — Z87442 Personal history of urinary calculi: Secondary | ICD-10-CM | POA: Insufficient documentation

## 2017-01-31 DIAGNOSIS — Z992 Dependence on renal dialysis: Secondary | ICD-10-CM | POA: Diagnosis not present

## 2017-01-31 DIAGNOSIS — Q612 Polycystic kidney, adult type: Secondary | ICD-10-CM | POA: Diagnosis not present

## 2017-01-31 DIAGNOSIS — R0602 Shortness of breath: Secondary | ICD-10-CM | POA: Diagnosis not present

## 2017-01-31 HISTORY — DX: Personal history of urinary calculi: Z87.442

## 2017-01-31 HISTORY — PX: BASCILIC VEIN TRANSPOSITION: SHX5742

## 2017-01-31 HISTORY — DX: Pneumonia, unspecified organism: J18.9

## 2017-01-31 LAB — POCT I-STAT 4, (NA,K, GLUC, HGB,HCT)
Glucose, Bld: 82 mg/dL (ref 65–99)
HCT: 43 % (ref 39.0–52.0)
Hemoglobin: 14.6 g/dL (ref 13.0–17.0)
Potassium: 4 mmol/L (ref 3.5–5.1)
Sodium: 138 mmol/L (ref 135–145)

## 2017-01-31 LAB — GLUCOSE, CAPILLARY: Glucose-Capillary: 88 mg/dL (ref 65–99)

## 2017-01-31 SURGERY — TRANSPOSITION, VEIN, BASILIC
Anesthesia: General | Site: Arm Upper | Laterality: Left

## 2017-01-31 MED ORDER — FENTANYL CITRATE (PF) 100 MCG/2ML IJ SOLN
INTRAMUSCULAR | Status: DC | PRN
  Administered 2017-01-31 (×4): 50 ug via INTRAVENOUS

## 2017-01-31 MED ORDER — HEMOSTATIC AGENTS (NO CHARGE) OPTIME
TOPICAL | Status: DC | PRN
  Administered 2017-01-31: 1 via TOPICAL

## 2017-01-31 MED ORDER — DEXAMETHASONE SODIUM PHOSPHATE 10 MG/ML IJ SOLN
INTRAMUSCULAR | Status: DC | PRN
  Administered 2017-01-31: 10 mg via INTRAVENOUS

## 2017-01-31 MED ORDER — SODIUM CHLORIDE 0.9 % IV SOLN
0.0000 ug/min | INTRAVENOUS | Status: DC
  Filled 2017-01-31: qty 1

## 2017-01-31 MED ORDER — PHENYLEPHRINE HCL 10 MG/ML IJ SOLN
INTRAMUSCULAR | Status: DC | PRN
  Administered 2017-01-31: 120 ug via INTRAVENOUS

## 2017-01-31 MED ORDER — PROPOFOL 10 MG/ML IV BOLUS
INTRAVENOUS | Status: AC
  Filled 2017-01-31: qty 20

## 2017-01-31 MED ORDER — DEXTROSE 5 % IV SOLN
INTRAVENOUS | Status: DC | PRN
  Administered 2017-01-31: 40 ug/min via INTRAVENOUS

## 2017-01-31 MED ORDER — LIDOCAINE HCL (CARDIAC) 20 MG/ML IV SOLN
INTRAVENOUS | Status: DC | PRN
  Administered 2017-01-31: 80 mg via INTRAVENOUS

## 2017-01-31 MED ORDER — ALBUTEROL SULFATE (2.5 MG/3ML) 0.083% IN NEBU
2.5000 mg | INHALATION_SOLUTION | Freq: Four times a day (QID) | RESPIRATORY_TRACT | Status: DC | PRN
  Administered 2017-01-31: 2.5 mg via RESPIRATORY_TRACT

## 2017-01-31 MED ORDER — HYDROCODONE-ACETAMINOPHEN 7.5-325 MG PO TABS: 1.0000 | ORAL_TABLET | Freq: Once | ORAL | Status: DC | PRN

## 2017-01-31 MED ORDER — ONDANSETRON HCL 4 MG/2ML IJ SOLN: 4.0000 mg | Freq: Once | INTRAMUSCULAR | Status: DC | PRN

## 2017-01-31 MED ORDER — OXYCODONE-ACETAMINOPHEN 5-325 MG PO TABS: 1.0000 | ORAL_TABLET | Freq: Four times a day (QID) | ORAL | 0 refills | Status: DC | PRN

## 2017-01-31 MED ORDER — ETOMIDATE 2 MG/ML IV SOLN
INTRAVENOUS | Status: DC | PRN
  Administered 2017-01-31: 12 mg via INTRAVENOUS
  Administered 2017-01-31: 2 mg via INTRAVENOUS
  Administered 2017-01-31: 4 mg via INTRAVENOUS

## 2017-01-31 MED ORDER — FENTANYL CITRATE (PF) 100 MCG/2ML IJ SOLN: 25.0000 ug | INTRAMUSCULAR | Status: DC | PRN

## 2017-01-31 MED ORDER — DEXTROSE 5 % IV SOLN
1.5000 g | INTRAVENOUS | Status: AC
  Administered 2017-01-31: 1.5 g via INTRAVENOUS

## 2017-01-31 MED ORDER — MIDAZOLAM HCL 5 MG/5ML IJ SOLN
INTRAMUSCULAR | Status: DC | PRN
  Administered 2017-01-31: 2 mg via INTRAVENOUS

## 2017-01-31 MED ORDER — SUCCINYLCHOLINE CHLORIDE 20 MG/ML IJ SOLN
INTRAMUSCULAR | Status: DC | PRN
  Administered 2017-01-31: 100 mg via INTRAVENOUS

## 2017-01-31 MED ORDER — MIDAZOLAM HCL 2 MG/2ML IJ SOLN
INTRAMUSCULAR | Status: AC
  Filled 2017-01-31: qty 2

## 2017-01-31 MED ORDER — ONDANSETRON HCL 4 MG/2ML IJ SOLN
INTRAMUSCULAR | Status: DC | PRN
  Administered 2017-01-31: 4 mg via INTRAVENOUS

## 2017-01-31 MED ORDER — SODIUM CHLORIDE 0.9 % IV SOLN
INTRAVENOUS | Status: DC
  Administered 2017-01-31 (×2): via INTRAVENOUS

## 2017-01-31 MED ORDER — ALBUTEROL SULFATE (2.5 MG/3ML) 0.083% IN NEBU
INHALATION_SOLUTION | RESPIRATORY_TRACT | Status: AC
  Administered 2017-01-31: 2.5 mg via RESPIRATORY_TRACT
  Filled 2017-01-31: qty 3

## 2017-01-31 MED ORDER — DEXTROSE 5 % IV SOLN
INTRAVENOUS | Status: AC
  Filled 2017-01-31: qty 1.5

## 2017-01-31 MED ORDER — FENTANYL CITRATE (PF) 250 MCG/5ML IJ SOLN
INTRAMUSCULAR | Status: AC
  Filled 2017-01-31: qty 5

## 2017-01-31 MED ORDER — CHLORHEXIDINE GLUCONATE CLOTH 2 % EX PADS: 6.0000 | MEDICATED_PAD | Freq: Once | CUTANEOUS | Status: DC

## 2017-01-31 MED ORDER — 0.9 % SODIUM CHLORIDE (POUR BTL) OPTIME
TOPICAL | Status: DC | PRN
  Administered 2017-01-31: 1000 mL

## 2017-01-31 MED ORDER — SODIUM CHLORIDE 0.9 % IV SOLN
INTRAVENOUS | Status: DC | PRN
  Administered 2017-01-31: 500 mL

## 2017-01-31 SURGICAL SUPPLY — 46 items
ADH SKN CLS APL DERMABOND .7 (GAUZE/BANDAGES/DRESSINGS) ×1
AGENT HMST SPONGE THK3/8 (HEMOSTASIS) ×1
ARMBAND PINK RESTRICT EXTREMIT (MISCELLANEOUS) ×3 IMPLANT
CANISTER SUCT 3000ML PPV (MISCELLANEOUS) ×3 IMPLANT
CLIP VESOCCLUDE MED 24/CT (CLIP) ×3 IMPLANT
CLIP VESOCCLUDE SM WIDE 24/CT (CLIP) ×3 IMPLANT
CORDS BIPOLAR (ELECTRODE) IMPLANT
COVER PROBE W GEL 5X96 (DRAPES) IMPLANT
DECANTER SPIKE VIAL GLASS SM (MISCELLANEOUS) ×3 IMPLANT
DERMABOND ADVANCED (GAUZE/BANDAGES/DRESSINGS) ×2
DERMABOND ADVANCED .7 DNX12 (GAUZE/BANDAGES/DRESSINGS) ×1 IMPLANT
DRSG COVADERM 4X10 (GAUZE/BANDAGES/DRESSINGS) ×2 IMPLANT
DRSG COVADERM 4X6 (GAUZE/BANDAGES/DRESSINGS) ×2 IMPLANT
ELECT REM PT RETURN 9FT ADLT (ELECTROSURGICAL) ×3
ELECTRODE REM PT RTRN 9FT ADLT (ELECTROSURGICAL) ×1 IMPLANT
GAUZE SPONGE 4X4 16PLY XRAY LF (GAUZE/BANDAGES/DRESSINGS) ×4 IMPLANT
GLOVE BIO SURGEON STRL SZ 6.5 (GLOVE) ×1 IMPLANT
GLOVE BIO SURGEON STRL SZ7 (GLOVE) ×3 IMPLANT
GLOVE BIO SURGEONS STRL SZ 6.5 (GLOVE) ×1
GLOVE BIOGEL PI IND STRL 6.5 (GLOVE) IMPLANT
GLOVE BIOGEL PI IND STRL 7.5 (GLOVE) ×1 IMPLANT
GLOVE BIOGEL PI INDICATOR 6.5 (GLOVE) ×4
GLOVE BIOGEL PI INDICATOR 7.5 (GLOVE) ×2
GOWN STRL REUS W/ TWL LRG LVL3 (GOWN DISPOSABLE) ×3 IMPLANT
GOWN STRL REUS W/TWL LRG LVL3 (GOWN DISPOSABLE) ×12
HEMOSTAT SPONGE AVITENE ULTRA (HEMOSTASIS) ×2 IMPLANT
KIT BASIN OR (CUSTOM PROCEDURE TRAY) ×3 IMPLANT
KIT ROOM TURNOVER OR (KITS) ×3 IMPLANT
NS IRRIG 1000ML POUR BTL (IV SOLUTION) ×3 IMPLANT
PACK CV ACCESS (CUSTOM PROCEDURE TRAY) ×3 IMPLANT
PAD ARMBOARD 7.5X6 YLW CONV (MISCELLANEOUS) ×6 IMPLANT
STAPLER VISISTAT 35W (STAPLE) ×4 IMPLANT
SUT MNCRL AB 4-0 PS2 18 (SUTURE) ×3 IMPLANT
SUT PROLENE 6 0 BV (SUTURE) ×3 IMPLANT
SUT PROLENE 7 0 BV 1 (SUTURE) ×2 IMPLANT
SUT SILK 2 0 (SUTURE) ×3
SUT SILK 2 0 SH (SUTURE) IMPLANT
SUT SILK 2-0 18XBRD TIE 12 (SUTURE) IMPLANT
SUT SILK 3 0 (SUTURE) ×3
SUT SILK 3-0 18XBRD TIE 12 (SUTURE) IMPLANT
SUT VIC AB 2-0 CT1 27 (SUTURE) ×6
SUT VIC AB 2-0 CT1 TAPERPNT 27 (SUTURE) ×1 IMPLANT
SUT VIC AB 3-0 SH 27 (SUTURE) ×9
SUT VIC AB 3-0 SH 27X BRD (SUTURE) ×2 IMPLANT
UNDERPAD 30X30 (UNDERPADS AND DIAPERS) ×3 IMPLANT
WATER STERILE IRR 1000ML POUR (IV SOLUTION) ×3 IMPLANT

## 2017-01-31 NOTE — Transfer of Care (Signed)
Immediate Anesthesia Transfer of Care Note  Patient: Michael [name]  Procedure(s) Performed: LEFT ARM BASILIC VEIN TRANSPOSITION, SECOND STAGE (Left Arm Upper)  Patient Location: PACU  Anesthesia Type:General  Level of Consciousness: awake, alert , oriented and patient cooperative  Airway & Oxygen Therapy: Patient Spontanous Breathing and Patient connected to face mask oxygen  Post-op Assessment: Report given to RN and Post -op Vital signs reviewed and stable  Post vital signs: Reviewed and stable  Last Vitals:  Vitals:   01/31/17 0641  BP: 100/72  Pulse: 91  Resp: 18  Temp: 36.8 C  SpO2: 99%    Last Pain:  Vitals:   01/31/17 0641  TempSrc: Oral      Patients Stated Pain Goal: 1 (29/93/71 6967)  Complications: No apparent anesthesia complications

## 2017-01-31 NOTE — Anesthesia Postprocedure Evaluation (Signed)
Anesthesia Post Note  Patient: Michael [name]  Procedure(s) Performed: LEFT ARM BASILIC VEIN TRANSPOSITION, SECOND STAGE (Left Arm Upper)     Patient location during evaluation: PACU Anesthesia Type: General Level of consciousness: awake and alert Pain management: pain level controlled Vital Signs Assessment: post-procedure vital signs reviewed and stable Respiratory status: spontaneous breathing, nonlabored ventilation, respiratory function stable and patient connected to nasal cannula oxygen Cardiovascular status: blood pressure returned to baseline and stable Postop Assessment: no apparent nausea or vomiting Anesthetic complications: no Comments: Had some wheezing post op which resolved with albuterol Rx.    Last Vitals:  Vitals:   01/31/17 1121 01/31/17 1136  BP: 110/69 109/85  Pulse: 82 82  Resp: 18 20  Temp:    SpO2: 95% 94%    Last Pain:  Vitals:   01/31/17 0641  TempSrc: Oral                 Trayson Stitely A.

## 2017-01-31 NOTE — Op Note (Signed)
OPERATIVE NOTE   PROCEDURE: left second stage basilic vein transposition (brachiobasilic arteriovenous fistula) placement with revision of anastomosis  PRE-OPERATIVE DIAGNOSIS: successful left first stage basilic vein transposition  POST-OPERATIVE DIAGNOSIS: same as above   SURGEON: Adele Barthel, MD  ASSISTANT(S): Linus Orn, CSA  ANESTHESIA: general  ESTIMATED BLOOD LOSS: 100 cc  FINDING(S): 1.  Weak thrill in basilic vein transposition with compromised flow distally in fistula 2.  Patent basilic vein without significant intra-luminal disease 3.  Palpable thrill after revision 4.  Faintly palpable radial pulse  SPECIMEN(S):  none  INDICATIONS:   Michael [name] is a 61 y.o. male who presents with end stage renal disease.  Patient had previous left brachiocephalic arteriovenous fistula which developed a contained rupture.  I excised the pseudoaneurysm and placed a left first stage basilic vein transposition.  This fistula matured adequately, so the patient is scheduled for left second stage basilic vein transposition.  The patient is aware the risks include but are not limited to: bleeding, infection, steal syndrome, nerve damage, ischemic monomelic neuropathy, failure to mature, and need for additional procedures.  The patient is aware of the risks of the procedure and elects to proceed forward.   DESCRIPTION: After full informed written consent was obtained from the patient, the patient was brought back to the operating room and placed supine upon the operating table.  Prior to induction, the patient received IV antibiotics.   After obtaining adequate anesthesia, the patient was then prepped and draped in the standard fashion for a left arm access procedure.  I turned my attention first to identifying the patient's brachiobasilic arteriovenous fistula.  Using SonoSite guidance, the location of this fistula was marked out on the skin.     I made an longitudinal incision over  the fistula from its arterial anastomosis up to its axillary extent.  I carefully dissected the fistula away from its adjacent nerves.  Extensive ligation of side branches was necessary in this basilic vein.  Based on exam, I felt the distal fistula was compromised, so revision of the anastomosis was necessary.  I felt there was enough redundancy in the length to proceed.    I dissected out the brachial artery proximal to the prior anastomosis.  I tied off the distal fistula with a 2-0 silk.  I transected the fistula and examined the lumen.  There no obvious disease in the fistula lumen, suggesting perianastomosis neointimal hyperplasia as the etiology of this fistula compromised.  I placed the brachial artery proximally and distally under tension with vessel loops.  I made an arteriotomy and then sewed the distal fistula to the brachial artery in an end-to-side configuration with a running stitch of 7-0 Prolene.  I released all vessel loops.  Immediately, the fistula distended with an improved thrill.  I dissected a plane on top of the bicipital fascia with electrocautery.  The fistula was secured in its new location with loosely placed interrupted 3-0 Vicryl stitches tied to side branches and the fascia.  The deep subcutaneous tissue was inspected for bleeding.  Bleeding was controlled with electrocautery and placement of large pieces of Avitene.  I washed out the surgical site after waiting a few minutes, and there was no further bleeding.  The fascia was reapproximated with interrupted stitches of 3-0 Vicryl to eliminate some of the deep space.  The superficial subcutaneous tissue was then reapproximated along the incision line with a running stitch of 3-0 Vicryl.  The skin was then reapproximated with staples.  The skin was then cleaned, dried, and reinforced with Dermabond.  The patient tolerated this procedure well.    COMPLICATIONS: none  CONDITION: stable   Adele Barthel, MD, [facility] [place] Office: [phone] Pager: [phone]  01/31/2017, 10:41 AM

## 2017-01-31 NOTE — H&P (View-Only) (Signed)
Postoperative Access Visit   History of Present Illness   Michael [name] is a 61 y.o. year old male who presents for postoperative follow-up for: left first stage basilic vein transposition, ligation and excision of left brachiocephalic arteriovenous fistula pseudoaneurysm (Date: 12/27/16).  The patient's wounds are healed.  The patient notes no steal symptoms.  The patient is able to complete their activities of daily living.  The patient's current symptoms are: none.  Past Medical History:  Diagnosis Date  . Dyspnea    on exertion  . ESRD (end stage renal disease) on dialysis [facility])    Started dialysis Feb 2014, gets HD MWF at [facility]. Saw Dr Moshe Cipro prior to starting HD.  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 All except for one of his father's siblings had dialysis.  Has one brother with renal transplant, esrd also due to cystic kidney disease.    . Fatigue   . Hyperlipidemia   . Hyperparathyroidism, secondary renal ([place])   . Hypertension   . Hypoxemia 12/12/2013  . Kidney stones    pased one, [place]   . Nonischemic cardiomyopathy ([place])    Er 25% 2015, 55 % 2013  . OSA on CPAP    uses cpap setting of 4  . OSA on CPAP 03/24/2014  . Polycystic kidney disease, autosomal dominant   . Ventricular tachycardia//Freq PVCs     Past Surgical History:  Procedure Laterality Date  . APPENDECTOMY    . AV FISTULA PLACEMENT  12/05/2011   Procedure: ARTERIOVENOUS (AV) FISTULA CREATION;LLEFT ARM  Surgeon: Conrad Barwick, MD;  Location: [place];  Service: Vascular;  Laterality: Left;  RADIO-CEPHALIC  fistula left arm  . AV FISTULA PLACEMENT  01/11/2012   Procedure: ARTERIOVENOUS (AV) FISTULA CREATION;  Surgeon: Conrad [place], MD;  Location: [place];  Service: Vascular;  Laterality: Left;  Creation of left brachial cephalic arteriovenous fistula  . BASCILIC VEIN TRANSPOSITION Left 12/27/2016   Procedure: BASILIC VEIN TRANSPOSITION LEFT UPPER ARM FIRST STAGE;  Surgeon:  Conrad Lipscomb, MD;  Location: [place];  Service: Vascular;  Laterality: Left;  . CARDIAC CATHETERIZATION  04-05-2010   checking for blockage but none-[facility]  . CYSTOSCOPY W/ STONE MANIPULATION     "laser once" (01/22/2013)  . HERNIA REPAIR    . INGUINAL HERNIA REPAIR Right 11/06/2015   Procedure: OPEN HERNIA REPAIR  RIGHT INGUINAL ADULT;  Surgeon: [name], MD;  [facility];  Service: General;  Laterality: Right;  with MESH  . INSERTION OF DIALYSIS CATHETER Right 10/05/2016   Procedure: INSERTION OF right internal jugular DIALYSIS CATHETER;  Surgeon: [name], MD;  Location: [place];  Service: Vascular;  Laterality: Right;  . INSERTION OF MESH  03/20/2012   Procedure: INSERTION OF MESH;  UMB Surgeon: [name], MD;  Location: [place];  Service: General;  Laterality: N/A;  . INSERTION OF MESH N/A 01/22/2013   Procedure: INSERTION OF MESH;  Surgeon: [name], MD;  Location: [place];  Service: General;  Laterality: N/A;  . LAPAROTOMY  04/02/2012   Procedure: EXPLORATORY LAPAROTOMY;  Surgeon: [name], MD;  Location: [place];  Service: General;  Laterality: N/A;  Exploratory Laparotomy with resection of small intestine  . LEFT HEART CATHETERIZATION WITH CORONARY ANGIOGRAM N/A 05/14/2013   Procedure: LEFT HEART CATHETERIZATION WITH CORONARY ANGIOGRAM;  Surgeon: Sinclair Grooms, MD;  Location: [facility] CATH LAB;  Service: Cardiovascular;  Laterality: N/A;  .  LIGATION OF ARTERIOVENOUS  FISTULA Left 12/27/2016   Procedure: LIGATION/EXCISION OF LEFT UPPER ARM ARTERIOVENOUS  FISTULA;  EVACUATION OF HEMATOMA;  Surgeon: Conrad [place], MD;  Location: [place];  Service: Vascular;  Laterality: Left;  . REVISON OF ARTERIOVENOUS FISTULA Left 10/05/2016   Procedure: REVISON OF left arm ARTERIOVENOUS FISTULA;  Surgeon: [name], MD;  Location: [place];  Service: Vascular;  Laterality: Left;  . TONSILLECTOMY    . UMBILICAL HERNIA REPAIR  03/20/2012   Procedure: HERNIA REPAIR UMBILICAL ADULT;   Surgeon: [name], MD;  Location: [place];  Service: General;  Laterality: N/A;  . UMBILICAL HERNIA REPAIR  01/22/2013   preperitoneal open procedure due to significant adhesions/notes 01/22/2013  . VENTRAL HERNIA REPAIR N/A 01/22/2013   Procedure: ATTEMPTED LAPAROSCOPIC VENTRAL HERNIA CONVERTED TO OPEN;  Surgeon: [name], MD;  Location: [place];  Service: General;  Laterality: N/A;    Social History   Social History  . Marital status: Married    Spouse name: [name]  . Number of children: 3  . Years of education: 12   Occupational History  .      disabled   Social History Main Topics  . Smoking status: Never Smoker  . Smokeless tobacco: Never Used  . Alcohol use No  . Drug use: No  . Sexual activity: Yes   Other Topics Concern  . Not on file   Social History Narrative   Patient is married [place]) and lives at home with his wife and children.   Patient has three children.   Patient is in disability.   Patient has a high school education.   Patient is right-handed   Patient drinks very little soda.                 Family History  Problem Relation Age of Onset  . Heart disease Mother   . Hyperlipidemia Mother   . Hypertension Mother   . Kidney disease Father   . Stroke Father   . Kidney disease Brother     Current Outpatient Prescriptions  Medication Sig Dispense Refill  . aspirin EC 81 MG EC tablet Take 1 tablet (81 mg total) by mouth daily.    [name] atorvastatin (LIPITOR) 40 MG tablet Take 40 mg by mouth every evening.     . calcium acetate (PHOSLO) 667 MG capsule Take 1,334 mg by mouth 3 (three) times daily with meals.   6  . metoprolol succinate (TOPROL-XL) 100 MG 24 hr tablet Take 100 mg by mouth daily. Take with or immediately following a meal.    . nitroGLYCERIN (NITROSTAT) 0.4 MG SL tablet Place 1 tablet (0.4 mg total) under the tongue every 5 (five) minutes x 3 doses as needed for chest pain. 30 tablet 12  . oxyCODONE-acetaminophen  (PERCOCET/ROXICET) 5-325 MG tablet Take 1 tablet by mouth every 4 (four) hours as needed. 20 tablet 0  . SENSIPAR 30 MG tablet Take 30 mg by mouth every evening.   5   No current facility-administered medications for this visit.      Allergies  Allergen Reactions  . No Known Allergies     REVIEW OF SYSTEMS (negative unless checked):   Cardiac:  []  Chest pain or chest pressure? []  Shortness of breath upon activity? []  Shortness of breath when lying flat? []  Irregular heart rhythm?  Vascular:  []  Pain in calf, thigh, or hip brought on by walking? []  Pain in feet at night that wakes you up from  your sleep? []  Blood clot in your veins? []  Leg swelling?  Pulmonary:  []  Oxygen at home? []  Productive cough? []  Wheezing?  Neurologic:  []  Sudden weakness in arms or legs? []  Sudden numbness in arms or legs? []  Sudden onset of difficult speaking or slurred speech? []  Temporary loss of vision in one eye? []  Problems with dizziness?  Gastrointestinal:  []  Blood in stool? []  Vomited blood?  Genitourinary:  []  Burning when urinating? []  Blood in urine? [x]  End stage renal disease-HD: M/W/F  Psychiatric:  []  Major depression  Hematologic:  []  Bleeding problems? []  Problems with blood clotting?  Dermatologic:  []  Rashes or ulcers?  Constitutional:  []  Fever or chills?  Ear/Nose/Throat:  []  Change in hearing? []  Nose bleeds? []  Sore throat?  Musculoskeletal:  []  Back pain? []  Joint pain? []  Muscle pain?   Physical Examination   Vitals:   01/27/17 1043  BP: 110/77  Pulse: 97  SpO2: 99%  Weight: 186 lb (84.4 kg)  Height: 5\' 8"  (1.727 m)   Body mass index is 28.28 kg/m.   Pulmonary Sym exp, good air movt, CTAB, no rales, rhonchi, & wheezing  Cardiac RRR, Nl S1, S2, no Murmurs, rubs or gallops   left arm Incisions are healed, skin feels warm, hand grip is 5/5, sensation in digits is intact, palpable thrill, bruit can be auscultated, on Sonosite: most of  BVT is 6 mm, distal tapers down to 4-5 mm    Medical Decision Making   Michael [name] is a 61 y.o. year old male who presents s/p left first stage basilic vein transposition, ligation and excision of left brachiocephalic arteriovenous fistula pseudoaneurysm    L 1st stage BVT is ready for transposition.  Will schedule this on 2 OCT 18.  Thank you for allowing [place] to participate in this patient's care.   Adele Barthel, MD, FACS [facility] [place] Office: [phone] Pager: [phone]

## 2017-01-31 NOTE — Interval H&P Note (Signed)
History and Physical Interval Note:  01/31/2017 7:15 AM  Michael [name]  has presented today for surgery, with the diagnosis of End Stage Renal Disease  N18.6  The various methods of treatment have been discussed with the patient and family. After consideration of risks, benefits and other options for treatment, the patient has consented to  Procedure(s): BASILIC VEIN TRANSPOSITION LEFT SECOND STAGE (Left) as a surgical intervention .  The patient's history has been reviewed, patient examined, no change in status, stable for surgery.  I have reviewed the patient's chart and labs.  Questions were answered to the patient's satisfaction.     Adele Barthel

## 2017-02-01 ENCOUNTER — Encounter (HOSPITAL_COMMUNITY): Payer: Self-pay | Admitting: Vascular Surgery

## 2017-02-02 ENCOUNTER — Telehealth: Payer: Self-pay | Admitting: Vascular Surgery

## 2017-02-02 DIAGNOSIS — N2581 Secondary hyperparathyroidism of renal origin: Secondary | ICD-10-CM | POA: Diagnosis not present

## 2017-02-02 DIAGNOSIS — N186 End stage renal disease: Secondary | ICD-10-CM | POA: Diagnosis not present

## 2017-02-02 NOTE — Telephone Encounter (Signed)
4 weeks w/ BLC in addition to previous msg about staple removal  Received: 2 days ago  Message Contents  McChesney, Tania Ade, RN  P [facility] Admin Pool      Previous Messages    ----- Message -----  From: Conrad Winchester, MD  Sent: 01/31/2017 11:01 AM  To: [facility] Charge [address]   Michael [name]  [id number]  04-May-1955   PROCEDURE:  left second stage basilic vein transposition (brachiobasilic arteriovenous fistula) placement with revision of anastomosis   Asst: Linus Orn, CSA   Follow-up: 4 weeks

## 2017-02-02 NOTE — Telephone Encounter (Signed)
-----   Message from Mena Goes, RN sent at 01/31/2017 12:34 PM EDT ----- Regarding: 2 weeks for staple removal   ----- Message ----- From: Conrad Portales, MD Sent: 01/31/2017  11:17 AM To: [facility] Charge [address]  Michael [name] [id number] [birth year]  Pt also needs f/u in 2 weeks with nurse for staple removal

## 2017-02-02 NOTE — Telephone Encounter (Signed)
Sched staple removal 02/17/17 at 3:15. Sched MD 03/03/17 at 11:15. Spoke to pt.

## 2017-02-03 DIAGNOSIS — N2581 Secondary hyperparathyroidism of renal origin: Secondary | ICD-10-CM | POA: Diagnosis not present

## 2017-02-03 DIAGNOSIS — N186 End stage renal disease: Secondary | ICD-10-CM | POA: Diagnosis not present

## 2017-02-06 DIAGNOSIS — N186 End stage renal disease: Secondary | ICD-10-CM | POA: Diagnosis not present

## 2017-02-06 DIAGNOSIS — N2581 Secondary hyperparathyroidism of renal origin: Secondary | ICD-10-CM | POA: Diagnosis not present

## 2017-02-08 DIAGNOSIS — N186 End stage renal disease: Secondary | ICD-10-CM | POA: Diagnosis not present

## 2017-02-08 DIAGNOSIS — N2581 Secondary hyperparathyroidism of renal origin: Secondary | ICD-10-CM | POA: Diagnosis not present

## 2017-02-10 DIAGNOSIS — N2581 Secondary hyperparathyroidism of renal origin: Secondary | ICD-10-CM | POA: Diagnosis not present

## 2017-02-10 DIAGNOSIS — N186 End stage renal disease: Secondary | ICD-10-CM | POA: Diagnosis not present

## 2017-02-13 DIAGNOSIS — N2581 Secondary hyperparathyroidism of renal origin: Secondary | ICD-10-CM | POA: Diagnosis not present

## 2017-02-13 DIAGNOSIS — N186 End stage renal disease: Secondary | ICD-10-CM | POA: Diagnosis not present

## 2017-02-15 DIAGNOSIS — N2581 Secondary hyperparathyroidism of renal origin: Secondary | ICD-10-CM | POA: Diagnosis not present

## 2017-02-15 DIAGNOSIS — N186 End stage renal disease: Secondary | ICD-10-CM | POA: Diagnosis not present

## 2017-02-17 ENCOUNTER — Ambulatory Visit (INDEPENDENT_AMBULATORY_CARE_PROVIDER_SITE_OTHER): Payer: Self-pay | Admitting: Family

## 2017-02-17 ENCOUNTER — Encounter: Payer: Self-pay | Admitting: Family

## 2017-02-17 VITALS — BP 122/81 | HR 82 | Temp 97.5°F | Resp 18 | Ht 66.0 in | Wt 186.0 lb

## 2017-02-17 DIAGNOSIS — N2581 Secondary hyperparathyroidism of renal origin: Secondary | ICD-10-CM | POA: Diagnosis not present

## 2017-02-17 DIAGNOSIS — N186 End stage renal disease: Secondary | ICD-10-CM | POA: Diagnosis not present

## 2017-02-17 DIAGNOSIS — I770 Arteriovenous fistula, acquired: Secondary | ICD-10-CM

## 2017-02-17 DIAGNOSIS — Z992 Dependence on renal dialysis: Secondary | ICD-10-CM

## 2017-02-17 DIAGNOSIS — Z4802 Encounter for removal of sutures: Secondary | ICD-10-CM

## 2017-02-17 NOTE — Progress Notes (Signed)
    Postoperative Access Visit   History of Present Illness  Michael [name] is a 61 y.o. year old male who is s/p left second stage basilic vein transposition (brachiobasilic arteriovenous fistula) placement with revision of anastomosis on 01-31-17  By Dr. Bridgett Larsson. He is s/p successful left first stage basilic vein transposition.  He returns today for staples removal and evaluation.  Pt is scheduled to see Dr. Bridgett Larsson on 03-03-17.   He denies any steal sx's in his left UE. He denies pain in his left UE.  He dialyzes M-W-F via right upper chest catheter, [facility] on [address].   The patient is able to complete their activities of daily living.    For VQI Use Only  PRE-ADM LIVING: Home  AMB STATUS: Ambulatory  Physical Examination Vitals:   02/17/17 1547  BP: 122/81  Pulse: 82  Resp: 18  Temp: (!) 97.5 F (36.4 C)  SpO2: 98%  Weight: 186 lb (84.4 kg)  Height: 5\' 6"  (1.676 m)   Body mass index is 30.02 kg/m.  Left arm: Incision is healed, skin feels warm and normal, hand grip is 5/5, sensation in digits is intact, palpable thrill, bruit can be auscultated   Medical Decision Making  Michael [name] is a 61 y.o. year old male who presents s/p left second stage basilic vein transposition (brachiobasilic arteriovenous fistula) placement with revision of anastomosis on 01-31-17  By Dr. Bridgett Larsson. He is s/p successful left first stage basilic vein transposition.  All staples were removed today and steri strips applied.  Follow up with Dr. Bridgett Larsson as already scheduled on 03-03-17.  Thank you for allowing [place] to participate in this patient's care.  Nanda Bittick, Sharmon Leyden, RN, MSN, FNP-C [facility] [place] Office: [phone]  02/17/2017, 4:08 PM  Clinic MD: Bridgett Larsson

## 2017-02-20 DIAGNOSIS — N2581 Secondary hyperparathyroidism of renal origin: Secondary | ICD-10-CM | POA: Diagnosis not present

## 2017-02-20 DIAGNOSIS — N186 End stage renal disease: Secondary | ICD-10-CM | POA: Diagnosis not present

## 2017-02-22 DIAGNOSIS — N2581 Secondary hyperparathyroidism of renal origin: Secondary | ICD-10-CM | POA: Diagnosis not present

## 2017-02-22 DIAGNOSIS — N186 End stage renal disease: Secondary | ICD-10-CM | POA: Diagnosis not present

## 2017-02-24 DIAGNOSIS — N186 End stage renal disease: Secondary | ICD-10-CM | POA: Diagnosis not present

## 2017-02-24 DIAGNOSIS — N2581 Secondary hyperparathyroidism of renal origin: Secondary | ICD-10-CM | POA: Diagnosis not present

## 2017-02-27 DIAGNOSIS — N2581 Secondary hyperparathyroidism of renal origin: Secondary | ICD-10-CM | POA: Diagnosis not present

## 2017-02-27 DIAGNOSIS — N186 End stage renal disease: Secondary | ICD-10-CM | POA: Diagnosis not present

## 2017-03-01 DIAGNOSIS — N186 End stage renal disease: Secondary | ICD-10-CM | POA: Diagnosis not present

## 2017-03-01 DIAGNOSIS — N2581 Secondary hyperparathyroidism of renal origin: Secondary | ICD-10-CM | POA: Diagnosis not present

## 2017-03-01 DIAGNOSIS — Q612 Polycystic kidney, adult type: Secondary | ICD-10-CM | POA: Diagnosis not present

## 2017-03-01 DIAGNOSIS — Z992 Dependence on renal dialysis: Secondary | ICD-10-CM | POA: Diagnosis not present

## 2017-03-02 NOTE — Progress Notes (Signed)
    Postoperative Access Visit   History of Present Illness   Michael [name] is a 61 y.o. year old male who presents for postoperative follow-up for: left second stage basilic vein transposition with revision of anastomosis (Date: 01/31/17).  I The patient's wounds are healed.  The patient notes no steal symptoms.  The patient is able to complete their activities of daily living.  The patient's current symptoms are: none.   Physical Examination   Vitals:   03/03/17 1113  BP: 121/81  Pulse: 83  Resp: 20  Temp: 98 F (36.7 C)  TempSrc: Oral  SpO2: 96%  Weight: 182 lb (82.6 kg)  Height: 5\' 6"  (1.676 m)   left arm Incision is healed, skin feels warm, hand grip is 5/5, sensation in digits is intact, palpable thrill, bruit can be auscultated, on Sonosite: fistula >6 mm throughout    Michael [name] is a 62 y.o. year old male who presents s/p left second stage basilic vein transposition   The patient's access is ready for use.  Thank you for allowing [place] to participate in this patient's care.   Adele Barthel, MD, FACS [facility] [place] Office: [phone] Pager: [phone]

## 2017-03-03 ENCOUNTER — Ambulatory Visit (INDEPENDENT_AMBULATORY_CARE_PROVIDER_SITE_OTHER): Payer: Self-pay | Admitting: Vascular Surgery

## 2017-03-03 ENCOUNTER — Encounter: Payer: Self-pay | Admitting: Vascular Surgery

## 2017-03-03 VITALS — BP 121/81 | HR 83 | Temp 98.0°F | Resp 20 | Ht 66.0 in | Wt 182.0 lb

## 2017-03-03 DIAGNOSIS — N2581 Secondary hyperparathyroidism of renal origin: Secondary | ICD-10-CM | POA: Diagnosis not present

## 2017-03-03 DIAGNOSIS — N186 End stage renal disease: Secondary | ICD-10-CM

## 2017-03-03 DIAGNOSIS — Z992 Dependence on renal dialysis: Secondary | ICD-10-CM

## 2017-03-06 DIAGNOSIS — N2581 Secondary hyperparathyroidism of renal origin: Secondary | ICD-10-CM | POA: Diagnosis not present

## 2017-03-06 DIAGNOSIS — N186 End stage renal disease: Secondary | ICD-10-CM | POA: Diagnosis not present

## 2017-03-07 DIAGNOSIS — I120 Hypertensive chronic kidney disease with stage 5 chronic kidney disease or end stage renal disease: Secondary | ICD-10-CM | POA: Diagnosis not present

## 2017-03-07 DIAGNOSIS — Z23 Encounter for immunization: Secondary | ICD-10-CM | POA: Diagnosis not present

## 2017-03-07 DIAGNOSIS — I493 Ventricular premature depolarization: Secondary | ICD-10-CM | POA: Diagnosis not present

## 2017-03-07 DIAGNOSIS — R002 Palpitations: Secondary | ICD-10-CM | POA: Diagnosis not present

## 2017-03-07 DIAGNOSIS — D485 Neoplasm of uncertain behavior of skin: Secondary | ICD-10-CM | POA: Diagnosis not present

## 2017-03-07 DIAGNOSIS — T8249XA Other complication of vascular dialysis catheter, initial encounter: Secondary | ICD-10-CM | POA: Diagnosis not present

## 2017-03-07 DIAGNOSIS — Z0000 Encounter for general adult medical examination without abnormal findings: Secondary | ICD-10-CM | POA: Diagnosis not present

## 2017-03-07 DIAGNOSIS — Z1159 Encounter for screening for other viral diseases: Secondary | ICD-10-CM | POA: Diagnosis not present

## 2017-03-07 DIAGNOSIS — N186 End stage renal disease: Secondary | ICD-10-CM | POA: Diagnosis not present

## 2017-03-07 DIAGNOSIS — D696 Thrombocytopenia, unspecified: Secondary | ICD-10-CM | POA: Diagnosis not present

## 2017-03-07 DIAGNOSIS — M109 Gout, unspecified: Secondary | ICD-10-CM | POA: Diagnosis not present

## 2017-03-07 DIAGNOSIS — Z1389 Encounter for screening for other disorder: Secondary | ICD-10-CM | POA: Diagnosis not present

## 2017-03-07 DIAGNOSIS — E782 Mixed hyperlipidemia: Secondary | ICD-10-CM | POA: Diagnosis not present

## 2017-03-08 DIAGNOSIS — N2581 Secondary hyperparathyroidism of renal origin: Secondary | ICD-10-CM | POA: Diagnosis not present

## 2017-03-08 DIAGNOSIS — N186 End stage renal disease: Secondary | ICD-10-CM | POA: Diagnosis not present

## 2017-03-10 DIAGNOSIS — N186 End stage renal disease: Secondary | ICD-10-CM | POA: Diagnosis not present

## 2017-03-10 DIAGNOSIS — N2581 Secondary hyperparathyroidism of renal origin: Secondary | ICD-10-CM | POA: Diagnosis not present

## 2017-03-13 DIAGNOSIS — N2581 Secondary hyperparathyroidism of renal origin: Secondary | ICD-10-CM | POA: Diagnosis not present

## 2017-03-13 DIAGNOSIS — N186 End stage renal disease: Secondary | ICD-10-CM | POA: Diagnosis not present

## 2017-03-15 DIAGNOSIS — N2581 Secondary hyperparathyroidism of renal origin: Secondary | ICD-10-CM | POA: Diagnosis not present

## 2017-03-15 DIAGNOSIS — N186 End stage renal disease: Secondary | ICD-10-CM | POA: Diagnosis not present

## 2017-03-17 DIAGNOSIS — N2581 Secondary hyperparathyroidism of renal origin: Secondary | ICD-10-CM | POA: Diagnosis not present

## 2017-03-17 DIAGNOSIS — N186 End stage renal disease: Secondary | ICD-10-CM | POA: Diagnosis not present

## 2017-03-19 DIAGNOSIS — N186 End stage renal disease: Secondary | ICD-10-CM | POA: Diagnosis not present

## 2017-03-19 DIAGNOSIS — N2581 Secondary hyperparathyroidism of renal origin: Secondary | ICD-10-CM | POA: Diagnosis not present

## 2017-03-21 DIAGNOSIS — N186 End stage renal disease: Secondary | ICD-10-CM | POA: Diagnosis not present

## 2017-03-21 DIAGNOSIS — N2581 Secondary hyperparathyroidism of renal origin: Secondary | ICD-10-CM | POA: Diagnosis not present

## 2017-03-24 DIAGNOSIS — N186 End stage renal disease: Secondary | ICD-10-CM | POA: Diagnosis not present

## 2017-03-24 DIAGNOSIS — N2581 Secondary hyperparathyroidism of renal origin: Secondary | ICD-10-CM | POA: Diagnosis not present

## 2017-03-27 DIAGNOSIS — N2581 Secondary hyperparathyroidism of renal origin: Secondary | ICD-10-CM | POA: Diagnosis not present

## 2017-03-27 DIAGNOSIS — N186 End stage renal disease: Secondary | ICD-10-CM | POA: Diagnosis not present

## 2017-03-31 DIAGNOSIS — H11153 Pinguecula, bilateral: Secondary | ICD-10-CM | POA: Diagnosis not present

## 2017-03-31 DIAGNOSIS — N2581 Secondary hyperparathyroidism of renal origin: Secondary | ICD-10-CM | POA: Diagnosis not present

## 2017-03-31 DIAGNOSIS — H2513 Age-related nuclear cataract, bilateral: Secondary | ICD-10-CM | POA: Diagnosis not present

## 2017-03-31 DIAGNOSIS — Z992 Dependence on renal dialysis: Secondary | ICD-10-CM | POA: Diagnosis not present

## 2017-03-31 DIAGNOSIS — Q612 Polycystic kidney, adult type: Secondary | ICD-10-CM | POA: Diagnosis not present

## 2017-03-31 DIAGNOSIS — H179 Unspecified corneal scar and opacity: Secondary | ICD-10-CM | POA: Diagnosis not present

## 2017-03-31 DIAGNOSIS — N186 End stage renal disease: Secondary | ICD-10-CM | POA: Diagnosis not present

## 2017-03-31 DIAGNOSIS — H34811 Central retinal vein occlusion, right eye, with macular edema: Secondary | ICD-10-CM | POA: Diagnosis not present

## 2017-04-03 DIAGNOSIS — N2581 Secondary hyperparathyroidism of renal origin: Secondary | ICD-10-CM | POA: Diagnosis not present

## 2017-04-03 DIAGNOSIS — N186 End stage renal disease: Secondary | ICD-10-CM | POA: Diagnosis not present

## 2017-04-03 DIAGNOSIS — D509 Iron deficiency anemia, unspecified: Secondary | ICD-10-CM | POA: Diagnosis not present

## 2017-04-05 DIAGNOSIS — N186 End stage renal disease: Secondary | ICD-10-CM | POA: Diagnosis not present

## 2017-04-05 DIAGNOSIS — D509 Iron deficiency anemia, unspecified: Secondary | ICD-10-CM | POA: Diagnosis not present

## 2017-04-05 DIAGNOSIS — N2581 Secondary hyperparathyroidism of renal origin: Secondary | ICD-10-CM | POA: Diagnosis not present

## 2017-04-07 DIAGNOSIS — N186 End stage renal disease: Secondary | ICD-10-CM | POA: Diagnosis not present

## 2017-04-07 DIAGNOSIS — D509 Iron deficiency anemia, unspecified: Secondary | ICD-10-CM | POA: Diagnosis not present

## 2017-04-07 DIAGNOSIS — N2581 Secondary hyperparathyroidism of renal origin: Secondary | ICD-10-CM | POA: Diagnosis not present

## 2017-04-11 ENCOUNTER — Encounter (INDEPENDENT_AMBULATORY_CARE_PROVIDER_SITE_OTHER): Payer: Medicare Other | Admitting: Ophthalmology

## 2017-04-12 DIAGNOSIS — N186 End stage renal disease: Secondary | ICD-10-CM | POA: Diagnosis not present

## 2017-04-12 DIAGNOSIS — D509 Iron deficiency anemia, unspecified: Secondary | ICD-10-CM | POA: Diagnosis not present

## 2017-04-12 DIAGNOSIS — N2581 Secondary hyperparathyroidism of renal origin: Secondary | ICD-10-CM | POA: Diagnosis not present

## 2017-04-14 ENCOUNTER — Encounter: Payer: Self-pay | Admitting: Vascular Surgery

## 2017-04-14 ENCOUNTER — Encounter: Payer: Self-pay | Admitting: *Deleted

## 2017-04-14 ENCOUNTER — Ambulatory Visit (INDEPENDENT_AMBULATORY_CARE_PROVIDER_SITE_OTHER): Payer: Self-pay | Admitting: Vascular Surgery

## 2017-04-14 ENCOUNTER — Other Ambulatory Visit: Payer: Self-pay | Admitting: *Deleted

## 2017-04-14 DIAGNOSIS — D509 Iron deficiency anemia, unspecified: Secondary | ICD-10-CM | POA: Diagnosis not present

## 2017-04-14 DIAGNOSIS — T82898A Other specified complication of vascular prosthetic devices, implants and grafts, initial encounter: Secondary | ICD-10-CM

## 2017-04-14 DIAGNOSIS — N186 End stage renal disease: Secondary | ICD-10-CM | POA: Diagnosis not present

## 2017-04-14 DIAGNOSIS — N2581 Secondary hyperparathyroidism of renal origin: Secondary | ICD-10-CM | POA: Diagnosis not present

## 2017-04-14 DIAGNOSIS — T82510A Breakdown (mechanical) of surgically created arteriovenous fistula, initial encounter: Secondary | ICD-10-CM | POA: Insufficient documentation

## 2017-04-14 NOTE — H&P (View-Only) (Signed)
Postoperative Access Visit   History of Present Illness   Michael [name] is a 61 y.o. year old male who presents for postoperative follow-up for: left second stage basilic vein transposition with revision of anastomosis (Date: 01/31/17).  The patient's wounds are healed.  The patient notes no steal symptoms.  The patient is able to complete their activities of daily living.  The patient's current symptoms are: recent infilitration of L arm fistula.  Inability to cannulated fistula.   Past Medical History:  Diagnosis Date  . Dyspnea    on exertion  . ESRD (end stage renal disease) ([place])    H[facility]- MWF, Polycystic kidney disease  . Fatigue   . History of kidney stones    removal of stone- cysto  . Hyperlipidemia   . Hyperparathyroidism, secondary renal ([place])   . Hypertension   . Hypoxemia 12/12/2013  . Nonischemic cardiomyopathy ([place])    Er 25% 2015, 55 % 2013  . OSA on CPAP    uses cpap setting of 4  . OSA on CPAP 03/24/2014  . Pneumonia    2015ish  . Ventricular tachycardia//Freq PVCs     Past Surgical History:  Procedure Laterality Date  . APPENDECTOMY    . AV FISTULA PLACEMENT  12/05/2011   Procedure: ARTERIOVENOUS (AV) FISTULA CREATION;LLEFT ARM  Surgeon: Conrad [place], MD;  Location: [place];  Service: Vascular;  Laterality: Left;  RADIO-CEPHALIC  fistula left arm  . AV FISTULA PLACEMENT  01/11/2012   Procedure: ARTERIOVENOUS (AV) FISTULA CREATION;  Surgeon: Conrad [place], MD;  Location: [place];  Service: Vascular;  Laterality: Left;  Creation of left brachial cephalic arteriovenous fistula  . BASCILIC VEIN TRANSPOSITION Left 12/27/2016   Procedure: BASILIC VEIN TRANSPOSITION LEFT UPPER ARM FIRST STAGE;  Surgeon: Conrad Meeker, MD;  Location: [place];  Service: Vascular;  Laterality: Left;  . BASCILIC VEIN TRANSPOSITION Left 01/31/2017   Procedure: LEFT ARM BASILIC VEIN TRANSPOSITION, SECOND STAGE;  Surgeon: Conrad [place], MD;  Location: [place];  Service: Vascular;  Laterality:  Left;  . CARDIAC CATHETERIZATION  04-05-2010   checking for blockage but none-[facility]  . COLONOSCOPY    . CYSTOSCOPY W/ STONE MANIPULATION     "laser once" (01/22/2013)  . HERNIA REPAIR    . INGUINAL HERNIA REPAIR Right 11/06/2015   Procedure: OPEN HERNIA REPAIR  RIGHT INGUINAL ADULT;  Surgeon: [name], MD;  [facility];  Service: General;  Laterality: Right;  with MESH  . INSERTION OF DIALYSIS CATHETER Right 10/05/2016   Procedure: INSERTION OF right internal jugular DIALYSIS CATHETER;  Surgeon: [name], MD;  Location: [place];  Service: Vascular;  Laterality: Right;  . INSERTION OF MESH  03/20/2012   Procedure: INSERTION OF MESH;  UMB Surgeon: [name], MD;  Location: [place];  Service: General;  Laterality: N/A;  . INSERTION OF MESH N/A 01/22/2013   Procedure: INSERTION OF MESH;  Surgeon: [name], MD;  Location: [place];  Service: General;  Laterality: N/A;  . LAPAROTOMY  04/02/2012   Procedure: EXPLORATORY LAPAROTOMY;  Surgeon: [name], MD;  Location: [place];  Service: General;  Laterality: N/A;  Exploratory Laparotomy with resection of small intestine  . LEFT HEART CATHETERIZATION WITH CORONARY ANGIOGRAM N/A 05/14/2013   Procedure: LEFT HEART CATHETERIZATION WITH CORONARY ANGIOGRAM;  Surgeon: Sinclair Grooms, MD;  Location: [facility] CATH LAB;  Service: Cardiovascular;  Laterality: N/A;  . LIGATION OF ARTERIOVENOUS  FISTULA Left 12/27/2016   Procedure: LIGATION/EXCISION  OF LEFT UPPER ARM ARTERIOVENOUS  FISTULA;  EVACUATION OF HEMATOMA;  Surgeon: Conrad [place], MD;  Location: [place];  Service: Vascular;  Laterality: Left;  . REVISON OF ARTERIOVENOUS FISTULA Left 10/05/2016   Procedure: REVISON OF left arm ARTERIOVENOUS FISTULA;  Surgeon: [name], MD;  Location: [place];  Service: Vascular;  Laterality: Left;  . TONSILLECTOMY    . UMBILICAL HERNIA REPAIR  03/20/2012   Procedure: HERNIA REPAIR UMBILICAL ADULT;  Surgeon: [name], MD;  Location: [place];   Service: General;  Laterality: N/A;  . UMBILICAL HERNIA REPAIR  01/22/2013   preperitoneal open procedure due to significant adhesions/notes 01/22/2013  . VENTRAL HERNIA REPAIR N/A 01/22/2013   Procedure: ATTEMPTED LAPAROSCOPIC VENTRAL HERNIA CONVERTED TO OPEN;  Surgeon: [name], MD;  Location: [place];  Service: General;  Laterality: N/A;    Social History   Socioeconomic History  . Marital status: Married    Spouse name: Michael [name]  . Number of children: 3  . Years of education: 20  . Highest education level: Not on file  Social Needs  . Financial resource strain: Not on file  . Food insecurity - worry: Not on file  . Food insecurity - inability: Not on file  . Transportation needs - medical: Not on file  . Transportation needs - non-medical: Not on file  Occupational History    Comment: disabled  Tobacco Use  . Smoking status: Never Smoker  . Smokeless tobacco: Never Used  Substance and Sexual Activity  . Alcohol use: No    Alcohol/week: 0.0 oz  . Drug use: No  . Sexual activity: Yes  Other Topics Concern  . Not on file  Social History Narrative   Patient is married [place]) and lives at home with his wife and children.   Patient has three children.   Patient is in disability.   Patient has a high school education.   Patient is right-handed   Patient drinks very little soda.             Family History  Problem Relation Age of Onset  . Heart disease Mother   . Hyperlipidemia Mother   . Hypertension Mother   . Kidney disease Father   . Stroke Father   . Kidney disease Brother     Current Outpatient Medications  Medication Sig Dispense Refill  . aspirin EC 81 MG EC tablet Take 1 tablet (81 mg total) by mouth daily.    [name] atorvastatin (LIPITOR) 40 MG tablet Take 40 mg by mouth every evening.     . calcium acetate (PHOSLO) 667 MG capsule Take 1,334 mg by mouth 3 (three) times daily with meals.   6  . metoprolol succinate (TOPROL-XL) 100 MG 24 hr tablet Take 50 mg  by mouth See admin instructions. Take 50 mg by mouth daily on Tuesday, Thursday and Saturday.    . nitroGLYCERIN (NITROSTAT) 0.4 MG SL tablet Place 1 tablet (0.4 mg total) under the tongue every 5 (five) minutes x 3 doses as needed for chest pain. 30 tablet 12  . SENSIPAR 30 MG tablet Take 30 mg by mouth every evening.   5   No current facility-administered medications for this visit.      No Known Allergies  REVIEW OF SYSTEMS (negative unless checked):   Cardiac:  []  Chest pain or chest pressure? []  Shortness of breath upon activity? []  Shortness of breath when lying flat? []  Irregular heart rhythm?  Vascular:  []  Pain in calf,  thigh, or hip brought on by walking? []  Pain in feet at night that wakes you up from your sleep? []  Blood clot in your veins? []  Leg swelling?  Pulmonary:  []  Oxygen at home? []  Productive cough? []  Wheezing?  Neurologic:  []  Sudden weakness in arms or legs? []  Sudden numbness in arms or legs? []  Sudden onset of difficult speaking or slurred speech? []  Temporary loss of vision in one eye? []  Problems with dizziness?  Gastrointestinal:  []  Blood in stool? []  Vomited blood?  Genitourinary:  []  Burning when urinating? []  Blood in urine? [x]   End stage renal disease-HD: M/W/F  Psychiatric:  []  Major depression  Hematologic:  []  Bleeding problems? []  Problems with blood clotting?  Dermatologic:  []  Rashes or ulcers?  Constitutional:  []  Fever or chills?  Ear/Nose/Throat:  []  Change in hearing? []  Nose bleeds? []  Sore throat?  Musculoskeletal:  []  Back pain? []  Joint pain? []  Muscle pain?   Physical Examination   Vitals:   04/14/17 1322  BP: 110/77  Pulse: (!) 101  Resp: 20  Temp: 98 F (36.7 C)  TempSrc: Oral  SpO2: 97%  Weight: 192 lb 14.4 oz (87.5 kg)  Height: 5\' 6"  (1.676 m)   Pulmonary Sym exp, good air movt, CTAB, no rales, rhonchi, & wheezing  Cardiac RRR, Nl S1, S2, no Murmurs, rubs or gallops   left arm  Incision is healed, skin feels warm, hand grip is 5/5, sensation in digits is intact, palpable thrill, bruit can be auscultated, on Sonosite: fistula > 6 mm throughout, mid-segment PSA vs contained rupture    Medical Decision Making   Javoris Star is a 61 y.o. year old male who presents s/p left second stage basilic vein transposition with revision of anastomosis    Would get L arm fistulogram to determine if L BVT is salvageable.  Thank you for allowing [place] to participate in this patient's care.   Adele Barthel, MD, FACS [facility] [place] Office: [phone] Pager: [phone]

## 2017-04-14 NOTE — Progress Notes (Signed)
Postoperative Access Visit   History of Present Illness   Michael [name] is a 61 y.o. year old male who presents for postoperative follow-up for: left second stage basilic vein transposition with revision of anastomosis (Date: 01/31/17).  The patient's wounds are healed.  The patient notes no steal symptoms.  The patient is able to complete their activities of daily living.  The patient's current symptoms are: recent infilitration of L arm fistula.  Inability to cannulated fistula.   Past Medical History:  Diagnosis Date  . Dyspnea    on exertion  . ESRD (end stage renal disease) ([place])    Hemo- MWF, Polycystic kidney disease  . Fatigue   . History of kidney stones    removal of stone- cysto  . Hyperlipidemia   . Hyperparathyroidism, secondary renal ([place])   . Hypertension   . Hypoxemia 12/12/2013  . Nonischemic cardiomyopathy ([place])    Er 25% 2015, 55 % 2013  . OSA on CPAP    uses cpap setting of 4  . OSA on CPAP 03/24/2014  . Pneumonia    2015ish  . Ventricular tachycardia//Freq PVCs     Past Surgical History:  Procedure Laterality Date  . APPENDECTOMY    . AV FISTULA PLACEMENT  12/05/2011   Procedure: ARTERIOVENOUS (AV) FISTULA CREATION;LLEFT ARM  Surgeon: Conrad [place], MD;  Location: [place];  Service: Vascular;  Laterality: Left;  RADIO-CEPHALIC  fistula left arm  . AV FISTULA PLACEMENT  01/11/2012   Procedure: ARTERIOVENOUS (AV) FISTULA CREATION;  Surgeon: Conrad [place], MD;  Location: [place];  Service: Vascular;  Laterality: Left;  Creation of left brachial cephalic arteriovenous fistula  . BASCILIC VEIN TRANSPOSITION Left 12/27/2016   Procedure: BASILIC VEIN TRANSPOSITION LEFT UPPER ARM FIRST STAGE;  Surgeon: Conrad [place], MD;  Location: [place];  Service: Vascular;  Laterality: Left;  . BASCILIC VEIN TRANSPOSITION Left 01/31/2017   Procedure: LEFT ARM BASILIC VEIN TRANSPOSITION, SECOND STAGE;  Surgeon: Conrad , MD;  Location: [place];  Service: Vascular;  Laterality:  Left;  . CARDIAC CATHETERIZATION  04-05-2010   checking for blockage but none-[facility]  . COLONOSCOPY    . CYSTOSCOPY W/ STONE MANIPULATION     "laser once" (01/22/2013)  . HERNIA REPAIR    . INGUINAL HERNIA REPAIR Right 11/06/2015   Procedure: OPEN HERNIA REPAIR  RIGHT INGUINAL ADULT;  Surgeon: [name], MD;  [facility];  Service: General;  Laterality: Right;  with MESH  . INSERTION OF DIALYSIS CATHETER Right 10/05/2016   Procedure: INSERTION OF right internal jugular DIALYSIS CATHETER;  Surgeon: [name], MD;  Location: [place];  Service: Vascular;  Laterality: Right;  . INSERTION OF MESH  03/20/2012   Procedure: INSERTION OF MESH;  UMB Surgeon: [name], MD;  Location: [place];  Service: General;  Laterality: N/A;  . INSERTION OF MESH N/A 01/22/2013   Procedure: INSERTION OF MESH;  Surgeon: [name], MD;  Location: [place];  Service: General;  Laterality: N/A;  . LAPAROTOMY  04/02/2012   Procedure: EXPLORATORY LAPAROTOMY;  Surgeon: [name], MD;  Location: [place];  Service: General;  Laterality: N/A;  Exploratory Laparotomy with resection of small intestine  . LEFT HEART CATHETERIZATION WITH CORONARY ANGIOGRAM N/A 05/14/2013   Procedure: LEFT HEART CATHETERIZATION WITH CORONARY ANGIOGRAM;  Surgeon: Sinclair Grooms, MD;  Location: [facility];  Service: Cardiovascular;  Laterality: N/A;  . LIGATION OF ARTERIOVENOUS  FISTULA Left 12/27/2016   Procedure: LIGATION/EXCISION  OF LEFT UPPER ARM ARTERIOVENOUS  FISTULA;  EVACUATION OF HEMATOMA;  Surgeon: Conrad Keenes, MD;  Location: [place];  Service: Vascular;  Laterality: Left;  . REVISON OF ARTERIOVENOUS FISTULA Left 10/05/2016   Procedure: REVISON OF left arm ARTERIOVENOUS FISTULA;  Surgeon: [name], MD;  Location: [place];  Service: Vascular;  Laterality: Left;  . TONSILLECTOMY    . UMBILICAL HERNIA REPAIR  03/20/2012   Procedure: HERNIA REPAIR UMBILICAL ADULT;  Surgeon: [name], MD;  Location: [place];   Service: General;  Laterality: N/A;  . UMBILICAL HERNIA REPAIR  01/22/2013   preperitoneal open procedure due to significant adhesions/notes 01/22/2013  . VENTRAL HERNIA REPAIR N/A 01/22/2013   Procedure: ATTEMPTED LAPAROSCOPIC VENTRAL HERNIA CONVERTED TO OPEN;  Surgeon: [name], MD;  Location: [place];  Service: General;  Laterality: N/A;    Social History   Socioeconomic History  . Marital status: Married    Spouse name: Michael [name]  . Number of children: 3  . Years of education: 87  . Highest education level: Not on file  Social Needs  . Financial resource strain: Not on file  . Food insecurity - worry: Not on file  . Food insecurity - inability: Not on file  . Transportation needs - medical: Not on file  . Transportation needs - non-medical: Not on file  Occupational History    Comment: disabled  Tobacco Use  . Smoking status: Never Smoker  . Smokeless tobacco: Never Used  Substance and Sexual Activity  . Alcohol use: No    Alcohol/week: 0.0 oz  . Drug use: No  . Sexual activity: Yes  Other Topics Concern  . Not on file  Social History Narrative   Patient is married [place]) and lives at home with his wife and children.   Patient has three children.   Patient is in disability.   Patient has a high school education.   Patient is right-handed   Patient drinks very little soda.             Family History  Problem Relation Age of Onset  . Heart disease Mother   . Hyperlipidemia Mother   . Hypertension Mother   . Kidney disease Father   . Stroke Father   . Kidney disease Brother     Current Outpatient Medications  Medication Sig Dispense Refill  . aspirin EC 81 MG EC tablet Take 1 tablet (81 mg total) by mouth daily.    [name] atorvastatin (LIPITOR) 40 MG tablet Take 40 mg by mouth every evening.     . calcium acetate (PHOSLO) 667 MG capsule Take 1,334 mg by mouth 3 (three) times daily with meals.   6  . metoprolol succinate (TOPROL-XL) 100 MG 24 hr tablet Take 50 mg  by mouth See admin instructions. Take 50 mg by mouth daily on Tuesday, Thursday and Saturday.    . nitroGLYCERIN (NITROSTAT) 0.4 MG SL tablet Place 1 tablet (0.4 mg total) under the tongue every 5 (five) minutes x 3 doses as needed for chest pain. 30 tablet 12  . SENSIPAR 30 MG tablet Take 30 mg by mouth every evening.   5   No current facility-administered medications for this visit.      No Known Allergies  REVIEW OF SYSTEMS (negative unless checked):   Cardiac:  []  Chest pain or chest pressure? []  Shortness of breath upon activity? []  Shortness of breath when lying flat? []  Irregular heart rhythm?  Vascular:  []  Pain in calf,  thigh, or hip brought on by walking? []  Pain in feet at night that wakes you up from your sleep? []  Blood clot in your veins? []  Leg swelling?  Pulmonary:  []  Oxygen at home? []  Productive cough? []  Wheezing?  Neurologic:  []  Sudden weakness in arms or legs? []  Sudden numbness in arms or legs? []  Sudden onset of difficult speaking or slurred speech? []  Temporary loss of vision in one eye? []  Problems with dizziness?  Gastrointestinal:  []  Blood in stool? []  Vomited blood?  Genitourinary:  []  Burning when urinating? []  Blood in urine? [x]   End stage renal disease-HD: M/W/F  Psychiatric:  []  Major depression  Hematologic:  []  Bleeding problems? []  Problems with blood clotting?  Dermatologic:  []  Rashes or ulcers?  Constitutional:  []  Fever or chills?  Ear/Nose/Throat:  []  Change in hearing? []  Nose bleeds? []  Sore throat?  Musculoskeletal:  []  Back pain? []  Joint pain? []  Muscle pain?   Physical Examination   Vitals:   04/14/17 1322  BP: 110/77  Pulse: (!) 101  Resp: 20  Temp: 98 F (36.7 C)  TempSrc: Oral  SpO2: 97%  Weight: 192 lb 14.4 oz (87.5 kg)  Height: 5\' 6"  (1.676 m)   Pulmonary Sym exp, good air movt, CTAB, no rales, rhonchi, & wheezing  Cardiac RRR, Nl S1, S2, no Murmurs, rubs or gallops   left arm  Incision is healed, skin feels warm, hand grip is 5/5, sensation in digits is intact, palpable thrill, bruit can be auscultated, on Sonosite: fistula > 6 mm throughout, mid-segment PSA vs contained rupture    Medical Decision Making   Helio Lack is a 61 y.o. year old male who presents s/p left second stage basilic vein transposition with revision of anastomosis    Would get L arm fistulogram to determine if L BVT is salvageable.  Thank you for allowing [place] to participate in this patient's care.   Adele Barthel, MD, FACS [facility] Office: [phone] Pager: [phone]

## 2017-04-14 NOTE — H&P (View-Only) (Signed)
Postoperative Access Visit   History of Present Illness   Michael [name] is a 61 y.o. year old male who presents for postoperative follow-up for: left second stage basilic vein transposition with revision of anastomosis (Date: 01/31/17).  The patient's wounds are healed.  The patient notes no steal symptoms.  The patient is able to complete their activities of daily living.  The patient's current symptoms are: recent infilitration of L arm fistula.  Inability to cannulated fistula.   Past Medical History:  Diagnosis Date  . Dyspnea    on exertion  . ESRD (end stage renal disease) ([place])    Hemo- MWF, Polycystic kidney disease  . Fatigue   . History of kidney stones    removal of stone- cysto  . Hyperlipidemia   . Hyperparathyroidism, secondary renal ([place])   . Hypertension   . Hypoxemia 12/12/2013  . Nonischemic cardiomyopathy ([place])    Er 25% 2015, 55 % 2013  . OSA on CPAP    uses cpap setting of 4  . OSA on CPAP 03/24/2014  . Pneumonia    2015ish  . Ventricular tachycardia//Freq PVCs     Past Surgical History:  Procedure Laterality Date  . APPENDECTOMY    . AV FISTULA PLACEMENT  12/05/2011   Procedure: ARTERIOVENOUS (AV) FISTULA CREATION;LLEFT ARM  Surgeon: Conrad [place], MD;  Location: [place];  Service: Vascular;  Laterality: Left;  RADIO-CEPHALIC  fistula left arm  . AV FISTULA PLACEMENT  01/11/2012   Procedure: ARTERIOVENOUS (AV) FISTULA CREATION;  Surgeon: Conrad [place], MD;  Location: [place];  Service: Vascular;  Laterality: Left;  Creation of left brachial cephalic arteriovenous fistula  . BASCILIC VEIN TRANSPOSITION Left 12/27/2016   Procedure: BASILIC VEIN TRANSPOSITION LEFT UPPER ARM FIRST STAGE;  Surgeon: Conrad Laie, MD;  Location: [place];  Service: Vascular;  Laterality: Left;  . BASCILIC VEIN TRANSPOSITION Left 01/31/2017   Procedure: LEFT ARM BASILIC VEIN TRANSPOSITION, SECOND STAGE;  Surgeon: Conrad [place], MD;  Location: [place];  Service: Vascular;  Laterality:  Left;  . CARDIAC CATHETERIZATION  04-05-2010   checking for blockage but none-[facility]  . COLONOSCOPY    . CYSTOSCOPY W/ STONE MANIPULATION     "laser once" (01/22/2013)  . HERNIA REPAIR    . INGUINAL HERNIA REPAIR Right 11/06/2015   Procedure: OPEN HERNIA REPAIR  RIGHT INGUINAL ADULT;  Surgeon: [name], MD;  [facility];  Service: General;  Laterality: Right;  with MESH  . INSERTION OF DIALYSIS CATHETER Right 10/05/2016   Procedure: INSERTION OF right internal jugular DIALYSIS CATHETER;  Surgeon: [name], MD;  Location: [place];  Service: Vascular;  Laterality: Right;  . INSERTION OF MESH  03/20/2012   Procedure: INSERTION OF MESH;  UMB Surgeon: [name], MD;  Location: [place];  Service: General;  Laterality: N/A;  . INSERTION OF MESH N/A 01/22/2013   Procedure: INSERTION OF MESH;  Surgeon: [name], MD;  Location: [place];  Service: General;  Laterality: N/A;  . LAPAROTOMY  04/02/2012   Procedure: EXPLORATORY LAPAROTOMY;  Surgeon: [name], MD;  Location: [place];  Service: General;  Laterality: N/A;  Exploratory Laparotomy with resection of small intestine  . LEFT HEART CATHETERIZATION WITH CORONARY ANGIOGRAM N/A 05/14/2013   Procedure: LEFT HEART CATHETERIZATION WITH CORONARY ANGIOGRAM;  Surgeon: Sinclair Grooms, MD;  Location: [facility] CATH LAB;  Service: Cardiovascular;  Laterality: N/A;  . LIGATION OF ARTERIOVENOUS  FISTULA Left 12/27/2016   Procedure: LIGATION/EXCISION  OF LEFT UPPER ARM ARTERIOVENOUS  FISTULA;  EVACUATION OF HEMATOMA;  Surgeon: Conrad Anderson, MD;  Location: [place];  Service: Vascular;  Laterality: Left;  . REVISON OF ARTERIOVENOUS FISTULA Left 10/05/2016   Procedure: REVISON OF left arm ARTERIOVENOUS FISTULA;  Surgeon: [name], MD;  Location: [place];  Service: Vascular;  Laterality: Left;  . TONSILLECTOMY    . UMBILICAL HERNIA REPAIR  03/20/2012   Procedure: HERNIA REPAIR UMBILICAL ADULT;  Surgeon: [name], MD;  Location: [place];   Service: General;  Laterality: N/A;  . UMBILICAL HERNIA REPAIR  01/22/2013   preperitoneal open procedure due to significant adhesions/notes 01/22/2013  . VENTRAL HERNIA REPAIR N/A 01/22/2013   Procedure: ATTEMPTED LAPAROSCOPIC VENTRAL HERNIA CONVERTED TO OPEN;  Surgeon: [name], MD;  Location: [place];  Service: General;  Laterality: N/A;    Social History   Socioeconomic History  . Marital status: Married    Spouse name: [name]  . Number of children: 3  . Years of education: 33  . Highest education level: Not on file  Social Needs  . Financial resource strain: Not on file  . Food insecurity - worry: Not on file  . Food insecurity - inability: Not on file  . Transportation needs - medical: Not on file  . Transportation needs - non-medical: Not on file  Occupational History    Comment: disabled  Tobacco Use  . Smoking status: Never Smoker  . Smokeless tobacco: Never Used  Substance and Sexual Activity  . Alcohol use: No    Alcohol/week: 0.0 oz  . Drug use: No  . Sexual activity: Yes  Other Topics Concern  . Not on file  Social History Narrative   Patient is married [place]) and lives at home with his wife and children.   Patient has three children.   Patient is in disability.   Patient has a high school education.   Patient is right-handed   Patient drinks very little soda.             Family History  Problem Relation Age of Onset  . Heart disease Mother   . Hyperlipidemia Mother   . Hypertension Mother   . Kidney disease Father   . Stroke Father   . Kidney disease Brother     Current Outpatient Medications  Medication Sig Dispense Refill  . aspirin EC 81 MG EC tablet Take 1 tablet (81 mg total) by mouth daily.    [name] atorvastatin (LIPITOR) 40 MG tablet Take 40 mg by mouth every evening.     . calcium acetate (PHOSLO) 667 MG capsule Take 1,334 mg by mouth 3 (three) times daily with meals.   6  . metoprolol succinate (TOPROL-XL) 100 MG 24 hr tablet Take 50 mg  by mouth See admin instructions. Take 50 mg by mouth daily on Tuesday, Thursday and Saturday.    . nitroGLYCERIN (NITROSTAT) 0.4 MG SL tablet Place 1 tablet (0.4 mg total) under the tongue every 5 (five) minutes x 3 doses as needed for chest pain. 30 tablet 12  . SENSIPAR 30 MG tablet Take 30 mg by mouth every evening.   5   No current facility-administered medications for this visit.      No Known Allergies  REVIEW OF SYSTEMS (negative unless checked):   Cardiac:  []  Chest pain or chest pressure? []  Shortness of breath upon activity? []  Shortness of breath when lying flat? []  Irregular heart rhythm?  Vascular:  []  Pain in calf,  thigh, or hip brought on by walking? []  Pain in feet at night that wakes you up from your sleep? []  Blood clot in your veins? []  Leg swelling?  Pulmonary:  []  Oxygen at home? []  Productive cough? []  Wheezing?  Neurologic:  []  Sudden weakness in arms or legs? []  Sudden numbness in arms or legs? []  Sudden onset of difficult speaking or slurred speech? []  Temporary loss of vision in one eye? []  Problems with dizziness?  Gastrointestinal:  []  Blood in stool? []  Vomited blood?  Genitourinary:  []  Burning when urinating? []  Blood in urine? [x]   End stage renal disease-HD: M/W/F  Psychiatric:  []  Major depression  Hematologic:  []  Bleeding problems? []  Problems with blood clotting?  Dermatologic:  []  Rashes or ulcers?  Constitutional:  []  Fever or chills?  Ear/Nose/Throat:  []  Change in hearing? []  Nose bleeds? []  Sore throat?  Musculoskeletal:  []  Back pain? []  Joint pain? []  Muscle pain?   Physical Examination   Vitals:   04/14/17 1322  BP: 110/77  Pulse: (!) 101  Resp: 20  Temp: 98 F (36.7 C)  TempSrc: Oral  SpO2: 97%  Weight: 192 lb 14.4 oz (87.5 kg)  Height: 5\' 6"  (1.676 m)   Pulmonary Sym exp, good air movt, CTAB, no rales, rhonchi, & wheezing  Cardiac RRR, Nl S1, S2, no Murmurs, rubs or gallops   left arm  Incision is healed, skin feels warm, hand grip is 5/5, sensation in digits is intact, palpable thrill, bruit can be auscultated, on Sonosite: fistula > 6 mm throughout, mid-segment PSA vs contained rupture    Medical Decision Making   Michael [name] is a 61 y.o. year old male who presents s/p left second stage basilic vein transposition with revision of anastomosis    Would get L arm fistulogram to determine if L BVT is salvageable.  Thank you for allowing [place] to participate in this patient's care.   Adele Barthel, MD, FACS [facility] [place] Office: [phone] Pager: [phone]

## 2017-04-17 DIAGNOSIS — N186 End stage renal disease: Secondary | ICD-10-CM | POA: Diagnosis not present

## 2017-04-17 DIAGNOSIS — D509 Iron deficiency anemia, unspecified: Secondary | ICD-10-CM | POA: Diagnosis not present

## 2017-04-17 DIAGNOSIS — N2581 Secondary hyperparathyroidism of renal origin: Secondary | ICD-10-CM | POA: Diagnosis not present

## 2017-04-19 DIAGNOSIS — N186 End stage renal disease: Secondary | ICD-10-CM | POA: Diagnosis not present

## 2017-04-19 DIAGNOSIS — D509 Iron deficiency anemia, unspecified: Secondary | ICD-10-CM | POA: Diagnosis not present

## 2017-04-19 DIAGNOSIS — N2581 Secondary hyperparathyroidism of renal origin: Secondary | ICD-10-CM | POA: Diagnosis not present

## 2017-04-19 NOTE — Progress Notes (Signed)
[facility] Note  04/21/2017     CHIEF COMPLAINT Patient presents for Retina Evaluation   HISTORY OF PRESENT ILLNESS: Michael [name] is a 61 y.o. male who presents to the clinic today for:   HPI    Retina Evaluation    In right eye.  This started 1 month ago.  Duration of 1 month.  Associated Symptoms Negative for Flashes, Blind Spot, Photophobia, Scalp Tenderness, Fever, Floaters, Pain, Glare, Jaw Claudication, Weight Loss, Distortion, Redness, Trauma, Shoulder/Hip pain and Fatigue.  Context:  distance vision, mid-range vision and near vision.  Response to treatment was no improvement.  I, the attending physician,  performed the HPI with the patient and updated documentation appropriately.          Comments    Pt presents today on the referral of Dr. Parke Simmers for concern of HRVO; pt states this has been going on for a month, pt states he has no flashes, floaters, wavy vision or pain, pt is on dialysis, pt denies the use of gtts;        Last edited by [name], MD on 04/22/2017 11:59 PM. (History)    Pt states that 2 months ago he began to have blurred VA OD; Pt states that he told his PCP about having blurred VA, and was sent to Dr. Parke Simmers for evaluation; Pt reports he is on dialysis (x5 years) for polystistic kidney disease, reports that it is hereditary; Pt states that he is prepared to have treatment today if needed;   Referring physician: Monna Fam, MD [place], [place] [id number]  HISTORICAL INFORMATION:   Selected notes from the MEDICAL RECORD NUMBER Referred by Dr. [name]. Hecker for concern of HRVO OD; LEE- 11.30.18 ([name]) {BCVA OD: 20/100-1 OS: 20/20-2] Ocular Hx- cataract OU PMH- HTN, high chol, kidney disease, sleep apnea, emphysema    CURRENT MEDICATIONS: No current outpatient medications on file. (Ophthalmic Drugs)   No current facility-administered medications for this visit.  (Ophthalmic Drugs)    Current Outpatient Medications (Other)  Medication Sig  . aspirin EC 81 MG EC tablet Take 1 tablet (81 mg total) by mouth daily.  [name] atorvastatin (LIPITOR) 40 MG tablet Take 40 mg by mouth every evening.   . calcium acetate (PHOSLO) 667 MG capsule Take 1,334 mg by mouth 3 (three) times daily with meals.   . metoprolol succinate (TOPROL-XL) 100 MG 24 hr tablet Take 50 mg by mouth See admin instructions. Take 50 mg by mouth daily on Tuesday, Thursday and Saturday.  . nitroGLYCERIN (NITROSTAT) 0.4 MG SL tablet Place 1 tablet (0.4 mg total) under the tongue every 5 (five) minutes x 3 doses as needed for chest pain.  . SENSIPAR 60 MG tablet Take 60 mg by mouth every evening.    Current Facility-Administered Medications (Other)  Medication Route  . Bevacizumab (AVASTIN) SOLN 1.25 mg Intravitreal      REVIEW OF SYSTEMS: ROS    Positive for: Genitourinary, Cardiovascular, Eyes   Negative for: Constitutional, Gastrointestinal, Neurological, Skin, Musculoskeletal, HENT, Endocrine, Respiratory, Psychiatric, Allergic/Imm, Heme/Lymph   Last edited by Debbrah Alar, COT on 04/21/2017  8:54 AM. (History)       ALLERGIES No Known Allergies  PAST MEDICAL HISTORY Past Medical History:  Diagnosis Date  . Dyspnea    on exertion  . ESRD (end stage renal disease) ([place])    Hemo- MWF, Polycystic kidney disease  . Fatigue   . History of kidney stones  removal of stone- cysto  . Hyperlipidemia   . Hyperparathyroidism, secondary renal ([place])   . Hypertension   . Hypoxemia 12/12/2013  . Nonischemic cardiomyopathy ([place])    Er 25% 2015, 55 % 2013  . OSA on CPAP    uses cpap setting of 4  . OSA on CPAP 03/24/2014  . Pneumonia    2015ish  . Ventricular tachycardia//Freq PVCs    Past Surgical History:  Procedure Laterality Date  . APPENDECTOMY    . AV FISTULA PLACEMENT  12/05/2011   Procedure: ARTERIOVENOUS (AV) FISTULA CREATION;LLEFT ARM  Surgeon: Conrad Gilbert, MD;  Location: [facility];   Service: Vascular;  Laterality: Left;  RADIO-CEPHALIC  fistula left arm  . AV FISTULA PLACEMENT  01/11/2012   Procedure: ARTERIOVENOUS (AV) FISTULA CREATION;  Surgeon: Conrad [place], MD;  Location: [place];  Service: Vascular;  Laterality: Left;  Creation of left brachial cephalic arteriovenous fistula  . BASCILIC VEIN TRANSPOSITION Left 12/27/2016   Procedure: BASILIC VEIN TRANSPOSITION LEFT UPPER ARM FIRST STAGE;  Surgeon: Conrad Pinconning, MD;  Location: [place];  Service: Vascular;  Laterality: Left;  . BASCILIC VEIN TRANSPOSITION Left 01/31/2017   Procedure: LEFT ARM BASILIC VEIN TRANSPOSITION, SECOND STAGE;  Surgeon: Conrad La [place], MD;  Location: [facility];  Service: Vascular;  Laterality: Left;  . CARDIAC CATHETERIZATION  04-05-2010   checking for blockage but none-[facility]  . COLONOSCOPY    . CYSTOSCOPY W/ STONE MANIPULATION     "laser once" (01/22/2013)  . HERNIA REPAIR    . INGUINAL HERNIA REPAIR Right 11/06/2015   Procedure: OPEN HERNIA REPAIR  RIGHT INGUINAL ADULT;  Surgeon: [name], MD;  Location: [facility];  Service: General;  Laterality: Right;  with MESH  . INSERTION OF DIALYSIS CATHETER Right 10/05/2016   Procedure: INSERTION OF right internal jugular DIALYSIS CATHETER;  Surgeon: [name], MD;  Location: [place];  Service: Vascular;  Laterality: Right;  . INSERTION OF MESH  03/20/2012   Procedure: INSERTION OF MESH;  UMB Surgeon: [name], MD;  Location: [facility];  Service: General;  Laterality: N/A;  . INSERTION OF MESH N/A 01/22/2013   Procedure: INSERTION OF MESH;  Surgeon: [name], MD;  Location: [facility];  Service: General;  Laterality: N/A;  . LAPAROTOMY  04/02/2012   Procedure: EXPLORATORY LAPAROTOMY;  Surgeon: [name], MD;  Location: [facility];  Service: General;  Laterality: N/A;  Exploratory Laparotomy with resection of small intestine  . LEFT HEART CATHETERIZATION WITH CORONARY ANGIOGRAM N/A 05/14/2013   Procedure: LEFT HEART CATHETERIZATION WITH CORONARY ANGIOGRAM;   Surgeon: Sinclair Grooms, MD;  Location[facility] C[facility];  Service: Cardiovascular;  Laterality: N/A;  . LIGATION OF ARTERIOVENOUS  FISTULA Left 12/27/2016   Procedure: LIGATION/EXCISION OF LEFT UPPER ARM ARTERIOVENOUS  FISTULA;  EVACUATION OF HEMATOMA;  Surgeon: Conrad [place], MD;  Location: [facility];  Service: Vascular;  Laterality: Left;  . REVISON OF ARTERIOVENOUS FISTULA Left 10/05/2016   Procedure: REVISON OF left arm ARTERIOVENOUS FISTULA;  Surgeon: [name], MD;  Location: [place];  Service: Vascular;  Laterality: Left;  . TONSILLECTOMY    . UMBILICAL HERNIA REPAIR  03/20/2012   Procedure: HERNIA REPAIR UMBILICAL ADULT;  Surgeon: [name], MD;  Location: [facility];  Service: General;  Laterality: N/A;  . UMBILICAL HERNIA REPAIR  01/22/2013   preperitoneal open procedure due to significant adhesions/notes 01/22/2013  . VENTRAL HERNIA REPAIR N/A 01/22/2013   Procedure: ATTEMPTED LAPAROSCOPIC VENTRAL HERNIA CONVERTED TO  OPEN;  Surgeon: [name], MD;  Location: [facility] OR;  Service: General;  Laterality: N/A;    FAMILY HISTORY Family History  Problem Relation Age of Onset  . Heart disease Mother   . Hyperlipidemia Mother   . Hypertension Mother   . Kidney disease Father   . Stroke Father   . Kidney disease Brother   . Amblyopia Neg Hx   . Blindness Neg Hx   . Cataracts Neg Hx   . Diabetes Neg Hx   . Glaucoma Neg Hx   . Macular degeneration Neg Hx   . Retinal detachment Neg Hx   . Strabismus Neg Hx   . Retinitis pigmentosa Neg Hx     SOCIAL HISTORY Social History   Tobacco Use  . Smoking status: Never Smoker  . Smokeless tobacco: Never Used  Substance Use Topics  . Alcohol use: No    Alcohol/week: 0.0 oz  . Drug use: No         OPHTHALMIC EXAM:  Base Eye Exam    Visual Acuity (Snellen - Linear)      Right Left   Dist [place] 20/100 -1 20/50 +2   Dist ph  20/100 20/25 -1       Tonometry (Tonopen, 9:02 AM)      Right Left   Pressure 14 17       Pupils       Dark Light Shape React APD   Right 3 3 Round Minimal None   Left 3 3 Round Minimal None       Visual Fields (Counting fingers)      Left Right    Full Full       Extraocular Movement      Right Left    Full, Ortho Full, Ortho       Neuro/Psych    Oriented x3:  Yes   Mood/Affect:  Normal       Dilation    Both eyes:  1.0% Mydriacyl, 2.5% Phenylephrine @ 9:02 AM        Slit Lamp and Fundus Exam    Slit Lamp Exam      Right Left   Lids/Lashes Dermatochalasis - upper lid Dermatochalasis - upper lid   Conjunctiva/Sclera Mild Melanosis Mild Melanosis   Cornea Arcus, scattered mild K haze - greatest inferiorly Arcus   Anterior Chamber Deep and quiet Deep and quiet   Iris Round and dilated Round and dilated   Lens 2+ Nuclear sclerosis, 2+ Cortical cataract 2+ Nuclear sclerosis, 2+ Cortical cataract   Vitreous Vitreous syneresis Vitreous syneresis       Fundus Exam      Right Left   Disc vascular loops superiorly Normal   C/D Ratio 0.4 0.55   Macula Blunted foveal reflex, dark intraretinal hemorrhage SN to macula, scattered DBH supeior macula Flat, Good foveal reflex, No heme or edema   Vessels Vascular attenuation, dilated and Tortuous, Copper wiring supiriorly Mild Copper wiring, mildly Tortuous   Periphery Attached, DBH superiorly and temporally Attached, pigmented lattice with 3 atrophic holes at 01030        Refraction    Wearing Rx      Sphere Add   Right +0.50 +2.25   Left +0.25 +2.25   Age:  8 yrs   Type:  Bifocal       Manifest Refraction (Retinoscopy)      Sphere Cylinder Dist VA   Right +1.50 Sphere 20/50   Left +0.50 Sphere 20/20-1  IMAGING AND PROCEDURES  Imaging and Procedures for 04/22/17  OCT, Retina - OU - Both Eyes     Right Eye Quality was good. Central Foveal Thickness: 248. Progression has no prior data. Findings include normal foveal contour, outer retinal atrophy, intraretinal fluid, no SRF.   Left Eye Quality was  good. Central Foveal Thickness: 268. Progression has no prior data. Findings include normal foveal contour, no IRF, no SRF, epiretinal membrane.   Notes *Images captured and stored on drive  Diagnosis / Impression:  OD: focal area of retinal edema/IRF SN to fovea OS: trace ERM  Clinical management:  See below  Abbreviations: NFP - Normal foveal profile. CME - cystoid macular edema. PED - pigment epithelial detachment. IRF - intraretinal fluid. SRF - subretinal fluid. EZ - ellipsoid zone. ERM - epiretinal membrane. ORA - outer retinal atrophy. ORT - outer retinal tubulation. SRHM - subretinal hyper-reflective material         Intravitreal Injection, Pharmacologic Agent - OD - Right Eye     Time Out 04/21/2017. 10:27 AM. Confirmed correct patient, procedure, site, and patient consented.   Anesthesia Topical anesthesia was used. Anesthetic medications included Lidocaine 2%, Tetracaine 0.5%.   Procedure Preparation included 5% betadine to ocular surface, eyelid speculum. A supplied needle was used.   Injection: 1.25 mg Bevacizumab 1.25mg /0.73ml   NDC: 64332-951-88    Lot: [id number]    Expiration Date: 06/04/2017   Route: Intravitreal   Site: Right Eye   Waste: 0 mg  Post-op Post injection exam found visual acuity of at least counting fingers. The patient tolerated the procedure well. There were no complications. The patient received written and verbal post procedure care education.                 ASSESSMENT/PLAN:    ICD-10-CM   1. Branch retinal vein occlusion of left eye with macular edema H34.8320 Intravitreal Injection, Pharmacologic Agent - OD - Right Eye    Bevacizumab (AVASTIN) SOLN 1.25 mg  2. Retinal edema H35.81 OCT, Retina - OU - Both Eyes    Intravitreal Injection, Pharmacologic Agent - OD - Right Eye    Bevacizumab (AVASTIN) SOLN 1.25 mg  3. Lattice degeneration of left retina H35.412   4. Combined forms of age-related cataract of both eyes H25.813      1,2. BRVO w/ CME OD - The natural history of retinal vein occlusion and macular edema and treatment options including observation, laser photocoagulation, and intravitreal antiVEGF injection with Avastin and Lucentis and Eylea and intravitreal injection of steroids with triamcinolone and Ozurdex and the complications of these procedures including loss of vision, infection, cataract, glaucoma, and retinal detachment were discussed with patient. - Specifically discussed findings from [place] / [place] study regarding patient stabilization with anti-VEGF agents and increased potential for visual improvements.  Also discussed need for frequent follow up and potentially multiple injections given the chronic nature of the disease process - BCVA 20/50 - OCT shows central CME tracking from sup nasal macula - recommend IVA OD #1 today, 12.21.18 - RBA of procedure discussed, questions answered - informed consent obtained and signed - see procedure note - F/U 4 weeks -- DFE/OCT/possible injection  3. Lattice degeneration with atrophic holes OS-  - peripheral holes superiorly without SRF or RD - recommend laser retinopexy OS - F/U 2-3 weeks for laser retinopexy OS  4. Combined forms age-related cataract OU-  - The symptoms of cataract, surgical options, and treatments and risks were discussed with patient. - discussed  diagnosis and progression - not yet visually significant - monitor for now   Ophthalmic Meds Ordered this visit:  Meds ordered this encounter  Medications  . Bevacizumab (AVASTIN) SOLN 1.25 mg       Return in about 2 weeks (around 05/05/2017) for Laser retinopexy OS.  There are no Patient Instructions on file for this visit.   Explained the diagnoses, plan, and follow up with the patient and they expressed understanding.  Patient expressed understanding of the importance of proper follow up care.   This document serves as a record of services personally performed by Gardiner Sleeper, MD, PhD. It was created on their behalf by Catha Brow, [place], a certified ophthalmic assistant. The creation of this record is the provider's dictation and/or activities during the visit.  Electronically signed by: Catha Brow, COA  04/22/17 11:59 PM    Gardiner Sleeper, M.D., Ph.D. Diseases & Surgery of the Retina and [place] 04/22/17   I have reviewed the above documentation for accuracy and completeness, and I agree with the above. Gardiner Sleeper, M.D., Ph.D. 04/23/17 12:07 AM     Abbreviations: M myopia (nearsighted); A astigmatism; H hyperopia (farsighted); P presbyopia; Mrx spectacle prescription;  CTL contact lenses; OD right eye; OS left eye; OU both eyes  XT exotropia; ET esotropia; PEK punctate epithelial keratitis; PEE punctate epithelial erosions; DES dry eye syndrome; MGD meibomian gland dysfunction; ATs artificial tears; PFAT's preservative free artificial tears; [place] nuclear sclerotic cataract; PSC posterior subcapsular cataract; ERM epi-retinal membrane; PVD posterior vitreous detachment; RD retinal detachment; DM diabetes mellitus; DR diabetic retinopathy; NPDR non-proliferative diabetic retinopathy; PDR proliferative diabetic retinopathy; CSME clinically significant macular edema; DME diabetic macular edema; dbh dot blot hemorrhages; CWS cotton wool spot; POAG primary open angle glaucoma; C/D cup-to-disc ratio; HVF humphrey visual field; GVF goldmann visual field; OCT optical coherence tomography; IOP intraocular pressure; BRVO Branch retinal vein occlusion; CRVO central retinal vein occlusion; CRAO central retinal artery occlusion; BRAO branch retinal artery occlusion; RT retinal tear; SB scleral buckle; PPV pars plana vitrectomy; VH Vitreous hemorrhage; PRP panretinal laser photocoagulation; IVK intravitreal kenalog; VMT vitreomacular traction; MH Macular hole;  NVD neovascularization of the disc; NVE neovascularization  elsewhere; AREDS age related eye disease study; ARMD age related macular degeneration; POAG primary open angle glaucoma; EBMD epithelial/anterior basement membrane dystrophy; ACIOL anterior chamber intraocular lens; IOL intraocular lens; PCIOL posterior chamber intraocular lens; Phaco/IOL phacoemulsification with intraocular lens placement; [place] photorefractive keratectomy; LASIK laser assisted in situ keratomileusis; HTN hypertension; DM diabetes mellitus; COPD chronic obstructive pulmonary disease

## 2017-04-21 ENCOUNTER — Encounter (INDEPENDENT_AMBULATORY_CARE_PROVIDER_SITE_OTHER): Payer: Self-pay | Admitting: Ophthalmology

## 2017-04-21 ENCOUNTER — Ambulatory Visit (INDEPENDENT_AMBULATORY_CARE_PROVIDER_SITE_OTHER): Payer: Medicare Other | Admitting: Ophthalmology

## 2017-04-21 DIAGNOSIS — H25813 Combined forms of age-related cataract, bilateral: Secondary | ICD-10-CM | POA: Diagnosis not present

## 2017-04-21 DIAGNOSIS — H34832 Tributary (branch) retinal vein occlusion, left eye, with macular edema: Secondary | ICD-10-CM

## 2017-04-21 DIAGNOSIS — H35412 Lattice degeneration of retina, left eye: Secondary | ICD-10-CM

## 2017-04-21 DIAGNOSIS — H3581 Retinal edema: Secondary | ICD-10-CM

## 2017-04-21 MED ORDER — BEVACIZUMAB CHEMO INJECTION 1.25MG/0.05ML SYRINGE FOR KALEIDOSCOPE
1.2500 mg | INTRAVITREAL | Status: DC
  Administered 2017-04-21: 1.25 mg via INTRAVITREAL

## 2017-04-23 ENCOUNTER — Encounter (INDEPENDENT_AMBULATORY_CARE_PROVIDER_SITE_OTHER): Payer: Self-pay | Admitting: Ophthalmology

## 2017-04-23 DIAGNOSIS — N2581 Secondary hyperparathyroidism of renal origin: Secondary | ICD-10-CM | POA: Diagnosis not present

## 2017-04-23 DIAGNOSIS — D509 Iron deficiency anemia, unspecified: Secondary | ICD-10-CM | POA: Diagnosis not present

## 2017-04-23 DIAGNOSIS — N186 End stage renal disease: Secondary | ICD-10-CM | POA: Diagnosis not present

## 2017-04-26 DIAGNOSIS — N186 End stage renal disease: Secondary | ICD-10-CM | POA: Diagnosis not present

## 2017-04-26 DIAGNOSIS — D509 Iron deficiency anemia, unspecified: Secondary | ICD-10-CM | POA: Diagnosis not present

## 2017-04-26 DIAGNOSIS — N2581 Secondary hyperparathyroidism of renal origin: Secondary | ICD-10-CM | POA: Diagnosis not present

## 2017-04-27 ENCOUNTER — Encounter (HOSPITAL_COMMUNITY): Payer: Self-pay | Admitting: Vascular Surgery

## 2017-04-27 ENCOUNTER — Encounter (HOSPITAL_COMMUNITY)
Admission: RE | Disposition: A | Discharge status: Home / Self Care | Payer: Self-pay | Source: Ambulatory Visit | Attending: Vascular Surgery

## 2017-04-27 ENCOUNTER — Telehealth: Payer: Self-pay | Admitting: *Deleted

## 2017-04-27 ENCOUNTER — Other Ambulatory Visit: Payer: Self-pay | Admitting: *Deleted

## 2017-04-27 ENCOUNTER — Ambulatory Visit (HOSPITAL_COMMUNITY)
Admission: RE | Admit: 2017-04-27 | Discharge: 2017-04-27 | Disposition: A | Discharge status: Home / Self Care | Payer: Medicare Other | Source: Ambulatory Visit | Attending: Vascular Surgery | Admitting: Vascular Surgery

## 2017-04-27 DIAGNOSIS — I120 Hypertensive chronic kidney disease with stage 5 chronic kidney disease or end stage renal disease: Secondary | ICD-10-CM | POA: Diagnosis not present

## 2017-04-27 DIAGNOSIS — Z79899 Other long term (current) drug therapy: Secondary | ICD-10-CM | POA: Insufficient documentation

## 2017-04-27 DIAGNOSIS — Y832 Surgical operation with anastomosis, bypass or graft as the cause of abnormal reaction of the patient, or of later complication, without mention of misadventure at the time of the procedure: Secondary | ICD-10-CM | POA: Diagnosis not present

## 2017-04-27 DIAGNOSIS — T82898A Other specified complication of vascular prosthetic devices, implants and grafts, initial encounter: Secondary | ICD-10-CM | POA: Diagnosis not present

## 2017-04-27 DIAGNOSIS — Z992 Dependence on renal dialysis: Secondary | ICD-10-CM | POA: Insufficient documentation

## 2017-04-27 DIAGNOSIS — Q613 Polycystic kidney, unspecified: Secondary | ICD-10-CM | POA: Diagnosis not present

## 2017-04-27 DIAGNOSIS — I428 Other cardiomyopathies: Secondary | ICD-10-CM | POA: Diagnosis not present

## 2017-04-27 DIAGNOSIS — Z87442 Personal history of urinary calculi: Secondary | ICD-10-CM | POA: Diagnosis not present

## 2017-04-27 DIAGNOSIS — E785 Hyperlipidemia, unspecified: Secondary | ICD-10-CM | POA: Insufficient documentation

## 2017-04-27 DIAGNOSIS — N2581 Secondary hyperparathyroidism of renal origin: Secondary | ICD-10-CM | POA: Diagnosis not present

## 2017-04-27 DIAGNOSIS — Z7982 Long term (current) use of aspirin: Secondary | ICD-10-CM | POA: Diagnosis not present

## 2017-04-27 DIAGNOSIS — N186 End stage renal disease: Secondary | ICD-10-CM | POA: Insufficient documentation

## 2017-04-27 DIAGNOSIS — G4733 Obstructive sleep apnea (adult) (pediatric): Secondary | ICD-10-CM | POA: Diagnosis not present

## 2017-04-27 DIAGNOSIS — Z9989 Dependence on other enabling machines and devices: Secondary | ICD-10-CM | POA: Diagnosis not present

## 2017-04-27 DIAGNOSIS — T82590A Other mechanical complication of surgically created arteriovenous fistula, initial encounter: Secondary | ICD-10-CM | POA: Diagnosis not present

## 2017-04-27 HISTORY — PX: A/V FISTULAGRAM: CATH118298

## 2017-04-27 LAB — POCT I-STAT, CHEM 8
BUN: 26 mg/dL — ABNORMAL HIGH (ref 6–20)
Calcium, Ion: 0.86 mmol/L — CL (ref 1.15–1.40)
Chloride: 95 mmol/L — ABNORMAL LOW (ref 101–111)
Creatinine, Ser: 5.8 mg/dL — ABNORMAL HIGH (ref 0.61–1.24)
Glucose, Bld: 75 mg/dL (ref 65–99)
HCT: 47 % (ref 39.0–52.0)
Hemoglobin: 16 g/dL (ref 13.0–17.0)
Potassium: 5.3 mmol/L — ABNORMAL HIGH (ref 3.5–5.1)
Sodium: 136 mmol/L (ref 135–145)
TCO2: 33 mmol/L — ABNORMAL HIGH (ref 22–32)

## 2017-04-27 SURGERY — A/V FISTULAGRAM
Anesthesia: LOCAL | Laterality: Left

## 2017-04-27 MED ORDER — SODIUM CHLORIDE 0.9 % IV SOLN: 250.0000 mL | INTRAVENOUS | Status: DC | PRN

## 2017-04-27 MED ORDER — LIDOCAINE HCL (PF) 1 % IJ SOLN
INTRAMUSCULAR | Status: DC | PRN
  Administered 2017-04-27: 3 mL

## 2017-04-27 MED ORDER — SODIUM CHLORIDE 0.9% FLUSH: 3.0000 mL | INTRAVENOUS | Status: DC | PRN

## 2017-04-27 MED ORDER — IODIXANOL 320 MG/ML IV SOLN
INTRAVENOUS | Status: DC | PRN
  Administered 2017-04-27: 40 mL via INTRA_ARTERIAL

## 2017-04-27 MED ORDER — SODIUM CHLORIDE 0.9% FLUSH: 3.0000 mL | Freq: Two times a day (BID) | INTRAVENOUS | Status: DC

## 2017-04-27 MED ORDER — HYDRALAZINE HCL 20 MG/ML IJ SOLN: 5.0000 mg | INTRAMUSCULAR | Status: DC | PRN

## 2017-04-27 MED ORDER — HEPARIN (PORCINE) IN NACL 2-0.9 UNIT/ML-% IJ SOLN
INTRAMUSCULAR | Status: AC
  Filled 2017-04-27: qty 500

## 2017-04-27 MED ORDER — LIDOCAINE HCL (PF) 1 % IJ SOLN
INTRAMUSCULAR | Status: AC
  Filled 2017-04-27: qty 30

## 2017-04-27 MED ORDER — HEPARIN (PORCINE) IN NACL 2-0.9 UNIT/ML-% IJ SOLN
INTRAMUSCULAR | Status: AC | PRN
Start: 2017-04-27 — End: 2017-04-27
  Administered 2017-04-27: 500 mL

## 2017-04-27 MED ORDER — LABETALOL HCL 5 MG/ML IV SOLN: 10.0000 mg | INTRAVENOUS | Status: DC | PRN

## 2017-04-27 SURGICAL SUPPLY — 10 items
BAG SNAP BAND KOVER 36X36 (MISCELLANEOUS) ×2 IMPLANT
COVER DOME SNAP 22 D (MISCELLANEOUS) ×2 IMPLANT
COVER PRB 48X5XTLSCP FOLD TPE (BAG) ×1 IMPLANT
COVER PROBE 5X48 (BAG) ×4
KIT MICROINTRODUCER STIFF 5F (SHEATH) ×1 IMPLANT
PROTECTION STATION PRESSURIZED (MISCELLANEOUS) ×2
STATION PROTECTION PRESSURIZED (MISCELLANEOUS) ×1 IMPLANT
STOPCOCK MORSE 400PSI 3WAY (MISCELLANEOUS) ×2 IMPLANT
TRAY PV CATH (CUSTOM PROCEDURE TRAY) ×2 IMPLANT
TUBING CIL FLEX 10 FLL-RA (TUBING) ×2 IMPLANT

## 2017-04-27 NOTE — Telephone Encounter (Signed)
Phone call to patient to be at [facility] admitting at 5:30 am for procedure. NPO past MN night prior and must have driver to go home. Follow the detailed instruction from the hospital Pre -Admission Testing Department about this surgery. Verbalized understanding.

## 2017-04-27 NOTE — Interval H&P Note (Signed)
History and Physical Interval Note:  04/27/2017 8:28 AM  Michael [name]  has presented today for surgery, with the diagnosis of esrd  The various methods of treatment have been discussed with the patient and family. After consideration of risks, benefits and other options for treatment, the patient has consented to  Procedure(s): A/V FISTULAGRAM (Left) as a surgical intervention .  The patient's history has been reviewed, patient examined, no change in status, stable for surgery.  I have reviewed the patient's chart and labs.  Questions were answered to the patient's satisfaction.     Adele Barthel

## 2017-04-27 NOTE — Op Note (Signed)
    OPERATIVE NOTE   PROCEDURE: 1.  left basilic vein transposition cannulation under ultrasound guidance 2.  left arm shuntogram  PRE-OPERATIVE DIAGNOSIS: left basilic vein transposition with difficulty cannulating   POST-OPERATIVE DIAGNOSIS: same as above   SURGEON: Adele Barthel, MD  ANESTHESIA: local  ESTIMATED BLOOD LOSS: 50 cc  FINDING(S): 1. Widely patent fistula 2. No obvious pseudoaneurysm at location of large superficial mass  SPECIMEN(S):  None  CONTRAST: 40 cc   INDICATIONS: Michael [name] is a 61 y.o. male who presents with left basilic vein transposition with difficulty cannulating concern for large pseudoaneurysm..  The patient is scheduled for left arm shuntogram.  The patient is aware the risks include but are not limited to: bleeding, infection, thrombosis of the cannulated access, and possible anaphylactic reaction to the contrast.  The patient is aware of the risks of the procedure and elects to proceed forward.   DESCRIPTION: After full informed written consent was obtained, the patient was brought back to the angiography suite and placed supine upon the angiography table.  The patient was connected to monitoring equipment.  The left upper arm was prepped and draped in the standard fashion for a left arm shuntogram.  Under ultrasound guidance, the left basilic vein transposition was cannulated with a micropuncture needle.  The microwire was advanced into the fistula and the needle was exchanged for the a microsheath, which was lodged 2 cm into the access.  The wire was removed and the sheath was connected to the IV extension tubing.  Hand injections were completed to image the access from the cannulation site up the right atrium.  Based on the images, this patient will need: evacuation of large hematoma overlying fistula.  A 4-0 Monocryl purse-string suture was sewn around the sheath.  The sheath was removed while tying down the suture.  A sterile bandage was  applied to the puncture site.   COMPLICATIONS: none  CONDITION: stable   Adele Barthel, MD, [facility] [place] Office: [phone] Pager: [phone]  04/27/2017 10:32 AM

## 2017-04-27 NOTE — Discharge Instructions (Signed)

## 2017-04-28 DIAGNOSIS — D509 Iron deficiency anemia, unspecified: Secondary | ICD-10-CM | POA: Diagnosis not present

## 2017-04-28 DIAGNOSIS — N2581 Secondary hyperparathyroidism of renal origin: Secondary | ICD-10-CM | POA: Diagnosis not present

## 2017-04-28 DIAGNOSIS — N186 End stage renal disease: Secondary | ICD-10-CM | POA: Diagnosis not present

## 2017-04-30 DIAGNOSIS — N2581 Secondary hyperparathyroidism of renal origin: Secondary | ICD-10-CM | POA: Diagnosis not present

## 2017-04-30 DIAGNOSIS — D509 Iron deficiency anemia, unspecified: Secondary | ICD-10-CM | POA: Diagnosis not present

## 2017-04-30 DIAGNOSIS — N186 End stage renal disease: Secondary | ICD-10-CM | POA: Diagnosis not present

## 2017-05-01 DIAGNOSIS — N186 End stage renal disease: Secondary | ICD-10-CM | POA: Diagnosis not present

## 2017-05-01 DIAGNOSIS — Q612 Polycystic kidney, adult type: Secondary | ICD-10-CM | POA: Diagnosis not present

## 2017-05-01 DIAGNOSIS — Z992 Dependence on renal dialysis: Secondary | ICD-10-CM | POA: Diagnosis not present

## 2017-05-03 DIAGNOSIS — D509 Iron deficiency anemia, unspecified: Secondary | ICD-10-CM | POA: Diagnosis not present

## 2017-05-03 DIAGNOSIS — N2581 Secondary hyperparathyroidism of renal origin: Secondary | ICD-10-CM | POA: Diagnosis not present

## 2017-05-03 DIAGNOSIS — N186 End stage renal disease: Secondary | ICD-10-CM | POA: Diagnosis not present

## 2017-05-04 NOTE — Progress Notes (Signed)
[facility] Note  05/05/2017     CHIEF COMPLAINT Patient presents for Retina Follow Up   HISTORY OF PRESENT ILLNESS: Michael [name] is a 62 y.o. male who presents to the clinic today for:   HPI    Retina Follow Up    In both eyes.  This started 1 month ago.  Since onset it is gradually improving.  I, the attending physician,  performed the HPI with the patient and updated documentation appropriately.          Comments    Pt here for laser retinopexy OS and for F/U BRVO w/CME OD. Patient states "the blurriness is less than it was in my right eye". Denies floaters, flashes and pain. Denies vits/gtts       Last edited by [name], MD on 05/06/2017 12:53 AM. (History)     Pt states that he is prepared to have treatment today if needed;   Referring physician: [name], MD [place], [place] [address]  HISTORICAL INFORMATION:   Selected notes from the MEDICAL RECORD NUMBER Referred by Dr. [name]. Hecker for concern of HRVO OD; LEE- 11.30.18 ([name]) {BCVA OD: 20/100-1 OS: 20/20-2] Ocular Hx- cataract OU PMH- HTN, high chol, kidney disease, sleep apnea, emphysema    CURRENT MEDICATIONS: Current Outpatient Medications (Ophthalmic Drugs)  Medication Sig  . prednisoLONE acetate (PRED FORTE) 1 % ophthalmic suspension Place 1 drop into the left eye 4 (four) times daily for 7 days.   No current facility-administered medications for this visit.  (Ophthalmic Drugs)   Current Outpatient Medications (Other)  Medication Sig  . aspirin EC 81 MG EC tablet Take 1 tablet (81 mg total) by mouth daily.  [name] atorvastatin (LIPITOR) 40 MG tablet Take 40 mg by mouth every evening.   . calcium acetate (PHOSLO) 667 MG capsule Take 1,334 mg by mouth 3 (three) times daily with meals.   . metoprolol succinate (TOPROL-XL) 100 MG 24 hr tablet Take 50 mg by mouth See admin instructions. Take 50 mg by mouth daily on Tuesday, Thursday and Saturday.   . nitroGLYCERIN (NITROSTAT) 0.4 MG SL tablet Place 1 tablet (0.4 mg total) under the tongue every 5 (five) minutes x 3 doses as needed for chest pain.  . SENSIPAR 60 MG tablet Take 60 mg by mouth every evening.    Current Facility-Administered Medications (Other)  Medication Route  . Bevacizumab (AVASTIN) SOLN 1.25 mg Intravitreal      REVIEW OF SYSTEMS: ROS    Positive for: Eyes   Negative for: Constitutional, Gastrointestinal, Neurological, Skin, Musculoskeletal, HENT, Endocrine, Cardiovascular, Respiratory, Psychiatric, Allergic/Imm, Heme/Lymph   Last edited by Zenovia Jordan, LPN on 6/0/4540  9:81 AM. (History)       ALLERGIES No Known Allergies  PAST MEDICAL HISTORY Past Medical History:  Diagnosis Date  . Dyspnea    on exertion  . ESRD (end stage renal disease) ([place])    Hemo- MWF, Polycystic kidney disease  . Fatigue   . History of kidney stones    removal of stone- cysto  . Hyperlipidemia   . Hyperparathyroidism, secondary renal ([place])   . Hypertension   . Hypoxemia 12/12/2013  . Nonischemic cardiomyopathy ([place])    Er 25% 2015, 55 % 2013  . OSA on CPAP    no longer using cpap  . OSA on CPAP 03/24/2014  . Pneumonia    2015ish  . Ventricular tachycardia//Freq PVCs    Past Surgical History:  Procedure Laterality Date  . A/V FISTULAGRAM Left 04/27/2017   Procedure: A/V FISTULAGRAM;  Surgeon: Conrad [place], MD;  Location: [facility];  Service: Cardiovascular;  Laterality: Left;  lt arm  . APPENDECTOMY    . AV FISTULA PLACEMENT  12/05/2011   Procedure: ARTERIOVENOUS (AV) FISTULA CREATION;LLEFT ARM  Surgeon: Conrad Dixon, MD;  Location: [facility];  Service: Vascular;  Laterality: Left;  RADIO-CEPHALIC  fistula left arm  . AV FISTULA PLACEMENT  01/11/2012   Procedure: ARTERIOVENOUS (AV) FISTULA CREATION;  Surgeon: Conrad Hockessin, MD;  Location: [facility];  Service: Vascular;  Laterality: Left;  Creation of left brachial cephalic arteriovenous fistula  . BASCILIC VEIN  TRANSPOSITION Left 12/27/2016   Procedure: BASILIC VEIN TRANSPOSITION LEFT UPPER ARM FIRST STAGE;  Surgeon: Conrad [place], MD;  Location: [facility];  Service: Vascular;  Laterality: Left;  . BASCILIC VEIN TRANSPOSITION Left 01/31/2017   Procedure: LEFT ARM BASILIC VEIN TRANSPOSITION, SECOND STAGE;  Surgeon: Conrad [place], MD;  Location: [facility];  Service: Vascular;  Laterality: Left;  . CARDIAC CATHETERIZATION  04-05-2010   checking for blockage but none-[facility]  . COLONOSCOPY    . CYSTOSCOPY W/ STONE MANIPULATION     "laser once" (01/22/2013)  . HERNIA REPAIR    . INGUINAL HERNIA REPAIR Right 11/06/2015   Procedure: OPEN HERNIA REPAIR  RIGHT INGUINAL ADULT;  Surgeon: [name], MD;  Location: [facility];  Service: General;  Laterality: Right;  with MESH  . INSERTION OF DIALYSIS CATHETER Right 10/05/2016   Procedure: INSERTION OF right internal jugular DIALYSIS CATHETER;  Surgeon: [name], MD;  Location: [facility];  Service: Vascular;  Laterality: Right;  . INSERTION OF MESH  03/20/2012   Procedure: INSERTION OF MESH;  UMB Surgeon: [name], MD;  Location: [facility];  Service: General;  Laterality: N/A;  . INSERTION OF MESH N/A 01/22/2013   Procedure: INSERTION OF MESH;  Surgeon: [name], MD;  Location: [facility];  Service: General;  Laterality: N/A;  . LAPAROTOMY  04/02/2012   Procedure: EXPLORATORY LAPAROTOMY;  Surgeon: [name], MD;  Location: [facility];  Service: General;  Laterality: N/A;  Exploratory Laparotomy with resection of small intestine  . LEFT HEART CATHETERIZATION WITH CORONARY ANGIOGRAM N/A 05/14/2013   Procedure: LEFT HEART CATHETERIZATION WITH CORONARY ANGIOGRAM;  Surgeon: Sinclair Grooms, MD;  Location: [facility];  Service: Cardiovascular;  Laterality: N/A;  . LIGATION OF ARTERIOVENOUS  FISTULA Left 12/27/2016   Procedure: LIGATION/EXCISION OF LEFT UPPER ARM ARTERIOVENOUS  FISTULA;  EVACUATION OF HEMATOMA;  Surgeon: Conrad Zena, MD;  Location: [facility];  Service:  Vascular;  Laterality: Left;  . REVISON OF ARTERIOVENOUS FISTULA Left 10/05/2016   Procedure: REVISON OF left arm ARTERIOVENOUS FISTULA;  Surgeon: [name], MD;  Location: [facility];  Service: Vascular;  Laterality: Left;  . TONSILLECTOMY    . UMBILICAL HERNIA REPAIR  03/20/2012   Procedure: HERNIA REPAIR UMBILICAL ADULT;  Surgeon: [name], MD;  Location: [place];  Service: General;  Laterality: N/A;  . UMBILICAL HERNIA REPAIR  01/22/2013   preperitoneal open procedure due to significant adhesions/notes 01/22/2013  . VENTRAL HERNIA REPAIR N/A 01/22/2013   Procedure: ATTEMPTED LAPAROSCOPIC VENTRAL HERNIA CONVERTED TO OPEN;  Surgeon: [name], MD;  [facility];  Service: General;  Laterality: N/A;    FAMILY HISTORY Family History  Problem Relation Age of Onset  . Heart disease Mother   . Hyperlipidemia Mother   . Hypertension  Mother   . Kidney disease Father   . Stroke Father   . Kidney disease Brother   . Amblyopia Neg Hx   . Blindness Neg Hx   . Cataracts Neg Hx   . Diabetes Neg Hx   . Glaucoma Neg Hx   . Macular degeneration Neg Hx   . Retinal detachment Neg Hx   . Strabismus Neg Hx   . Retinitis pigmentosa Neg Hx     SOCIAL HISTORY Social History   Tobacco Use  . Smoking status: Never Smoker  . Smokeless tobacco: Never Used  Substance Use Topics  . Alcohol use: No    Alcohol/week: 0.0 oz  . Drug use: No         OPHTHALMIC EXAM:  Base Eye Exam    Visual Acuity (Snellen - Linear)      Right Left   Dist [place] 20/100 20/50   Dist ph [place] NI Ni       Tonometry (Tonopen, 8:51 AM)      Right Left   Pressure 16 18       Pupils      Dark Light Shape React APD   Right 3 3 Round Minimal None   Left 3 3 Round Minimal None       Visual Fields (Counting fingers)      Left Right    Full Full       Extraocular Movement      Right Left    Full Full       Neuro/Psych    Oriented x3:  Yes   Mood/Affect:  Normal       Dilation    Both eyes:   1.0% Mydriacyl, 2.5% Phenylephrine @ 8:53 AM        Slit Lamp and Fundus Exam    Slit Lamp Exam      Right Left   Lids/Lashes Dermatochalasis - upper lid Dermatochalasis - upper lid   Conjunctiva/Sclera Mild Melanosis Mild Melanosis   Cornea Arcus, scattered mild K haze - greatest inferiorly Arcus   Anterior Chamber Deep and quiet Deep and quiet   Iris Round and dilated Round and dilated   Lens 2+ Nuclear sclerosis, 2+ Cortical cataract 2+ Nuclear sclerosis, 2+ Cortical cataract   Vitreous Vitreous syneresis Vitreous syneresis       Fundus Exam      Right Left   Disc  Normal   C/D Ratio 0.4 0.55   Macula  Flat, Good foveal reflex, No heme or edema   Vessels  Mild Copper wiring, mildly Tortuous   Periphery  Attached, pigmented lattice with 3 atrophic holes at 1030          IMAGING AND PROCEDURES  Imaging and Procedures for 05/06/17  OCT, Retina - OU - Both Eyes     Right Eye Quality was good. Central Foveal Thickness: 255. Progression has improved. Findings include normal foveal contour, outer retinal atrophy, intraretinal fluid, no SRF.   Left Eye Quality was good. Central Foveal Thickness: 264. Progression has been stable. Findings include normal foveal contour, no IRF, no SRF, epiretinal membrane.   Notes *Images captured and stored on drive  Diagnosis / Impression:  OD: focal area of retinal edema/IRF SN to fovea -- interval decrease in retinal edema OS: trace ERM  Clinical management:  See below  Abbreviations: NFP - Normal foveal profile. CME - cystoid macular edema. PED - pigment epithelial detachment. IRF - intraretinal fluid. SRF - subretinal fluid. EZ - ellipsoid  zone. ERM - epiretinal membrane. ORA - outer retinal atrophy. ORT - outer retinal tubulation. SRHM - subretinal hyper-reflective material         Repair Retinal Breaks, Laser - OS - Left Eye     LASER PROCEDURE NOTE  Procedure:  Barrier laser retinopexy using laser indirect ophthalmoscope,  LEFT eye   Diagnosis:   Lattice degeneration with atrophic retinal breaks at 1030 ora, LEFT eye  Surgeon: [name], MD, PhD  Anesthesia: Topical  Informed consent obtained, operative eye marked, and time out performed prior to initiation of laser.   Laser settings:  Lumenis [id number] laser indirect ophthalmoscope Power: 220 mW Duration: 70 msec  # spots: 243  Placement of laser: Laser was placed in three confluent rows posterior to patches of lattice with atrophic retinal breaks at 1030 ora.  Complications: None.  Patient tolerated the procedure well and received written and verbal post-procedure care information/education.                 ASSESSMENT/PLAN:    ICD-10-CM   1. Atrophic retinal break, multiple, left eye H33.332 Repair Retinal Breaks, Laser - OS - Left Eye  2. Branch retinal vein occlusion of left eye with macular edema H34.8320 OCT, Retina - OU - Both Eyes  3. Retinal edema H35.81   4. Lattice degeneration of left retina H35.412 Repair Retinal Breaks, Laser - OS - Left Eye  5. Combined forms of age-related cataract of both eyes H25.813    1,4. Lattice degeneration with atrophic holes OS-  - peripheral holes superiorly at 1030 without SRF or RD - recommend laser retinopexy OS - RBA of procedure discussed, questions answered - informed consent obtained and signed - see procedure note - F/U 2 wks for laser check and eval BRVO  2,3. BRVO w/ CME OD - s/p IVA OD #1 (12.21.18) - repeat OCT shows interval improvement in central CME tracking from sup nasal macula - good early response - F/U 2 weeks -- DFE/OCT/possible injection  5. Combined forms age-related cataract OU-  - The symptoms of cataract, surgical options, and treatments and risks were discussed with patient. - discussed diagnosis and progression - not yet visually significant - monitor for now   Ophthalmic Meds Ordered this visit:  Meds ordered this encounter  Medications  .  prednisoLONE acetate (PRED FORTE) 1 % ophthalmic suspension    Sig: Place 1 drop into the left eye 4 (four) times daily for 7 days.    Dispense:  10 mL    Refill:  0       Return in about 2 weeks (around 05/19/2017) for Dilated Exam, OCT, Possible Injxn.  There are no Patient Instructions on file for this visit.   Explained the diagnoses, plan, and follow up with the patient and they expressed understanding.  Patient expressed understanding of the importance of proper follow up care.   This document serves as a record of services personally performed by Gardiner Sleeper, MD, PhD. It was created on their behalf by Catha Brow, [place], a certified ophthalmic assistant. The creation of this record is the provider's dictation and/or activities during the visit.  Electronically signed by: Catha Brow, COA  05/06/17 12:59 AM     Gardiner Sleeper, M.D., Ph.D. Diseases & Surgery of the Retina and Vitreous [facility] 05/06/17   I have reviewed the above documentation for accuracy and completeness, and I agree with the above. Gardiner Sleeper, M.D., Ph.D. 05/06/17 12:59 AM  Abbreviations: M myopia (nearsighted); A astigmatism; H hyperopia (farsighted); P presbyopia; Mrx spectacle prescription;  CTL contact lenses; OD right eye; OS left eye; OU both eyes  XT exotropia; ET esotropia; PEK punctate epithelial keratitis; PEE punctate epithelial erosions; DES dry eye syndrome; MGD meibomian gland dysfunction; ATs artificial tears; PFAT's preservative free artificial tears; [place] nuclear sclerotic cataract; PSC posterior subcapsular cataract; ERM epi-retinal membrane; PVD posterior vitreous detachment; RD retinal detachment; DM diabetes mellitus; DR diabetic retinopathy; NPDR non-proliferative diabetic retinopathy; PDR proliferative diabetic retinopathy; CSME clinically significant macular edema; DME diabetic macular edema; dbh dot blot hemorrhages; CWS cotton wool spot;  POAG primary open angle glaucoma; C/D cup-to-disc ratio; HVF humphrey visual field; GVF goldmann visual field; OCT optical coherence tomography; IOP intraocular pressure; BRVO Branch retinal vein occlusion; CRVO central retinal vein occlusion; CRAO central retinal artery occlusion; BRAO branch retinal artery occlusion; RT retinal tear; SB scleral buckle; PPV pars plana vitrectomy; VH Vitreous hemorrhage; PRP panretinal laser photocoagulation; IVK intravitreal kenalog; VMT vitreomacular traction; MH Macular hole;  NVD neovascularization of the disc; NVE neovascularization elsewhere; AREDS age related eye disease study; ARMD age related macular degeneration; POAG primary open angle glaucoma; EBMD epithelial/anterior basement membrane dystrophy; ACIOL anterior chamber intraocular lens; IOL intraocular lens; PCIOL posterior chamber intraocular lens; Phaco/IOL phacoemulsification with intraocular lens placement; [place] photorefractive keratectomy; LASIK laser assisted in situ keratomileusis; HTN hypertension; DM diabetes mellitus; COPD chronic obstructive pulmonary disease

## 2017-05-05 ENCOUNTER — Other Ambulatory Visit: Payer: Self-pay

## 2017-05-05 ENCOUNTER — Encounter (HOSPITAL_COMMUNITY): Payer: Self-pay | Admitting: *Deleted

## 2017-05-05 ENCOUNTER — Encounter (INDEPENDENT_AMBULATORY_CARE_PROVIDER_SITE_OTHER): Payer: Self-pay | Admitting: Ophthalmology

## 2017-05-05 ENCOUNTER — Ambulatory Visit (INDEPENDENT_AMBULATORY_CARE_PROVIDER_SITE_OTHER): Payer: Medicare Other | Admitting: Ophthalmology

## 2017-05-05 DIAGNOSIS — H35412 Lattice degeneration of retina, left eye: Secondary | ICD-10-CM | POA: Diagnosis not present

## 2017-05-05 DIAGNOSIS — H25813 Combined forms of age-related cataract, bilateral: Secondary | ICD-10-CM

## 2017-05-05 DIAGNOSIS — H33332 Multiple defects of retina without detachment, left eye: Secondary | ICD-10-CM

## 2017-05-05 DIAGNOSIS — H34832 Tributary (branch) retinal vein occlusion, left eye, with macular edema: Secondary | ICD-10-CM

## 2017-05-05 DIAGNOSIS — H3581 Retinal edema: Secondary | ICD-10-CM

## 2017-05-05 MED ORDER — PREDNISOLONE ACETATE 1 % OP SUSP: 1.0000 [drp] | Freq: Four times a day (QID) | OPHTHALMIC | 0 refills | Status: AC

## 2017-05-05 NOTE — Progress Notes (Signed)
Spoke with pt for pre-op call. Pt denies cardiac history, chest pain or sob. Pt states he is not diabetic. 

## 2017-05-06 ENCOUNTER — Encounter (INDEPENDENT_AMBULATORY_CARE_PROVIDER_SITE_OTHER): Payer: Self-pay | Admitting: Ophthalmology

## 2017-05-06 DIAGNOSIS — N186 End stage renal disease: Secondary | ICD-10-CM | POA: Diagnosis not present

## 2017-05-06 DIAGNOSIS — N2581 Secondary hyperparathyroidism of renal origin: Secondary | ICD-10-CM | POA: Diagnosis not present

## 2017-05-06 DIAGNOSIS — D509 Iron deficiency anemia, unspecified: Secondary | ICD-10-CM | POA: Diagnosis not present

## 2017-05-08 DIAGNOSIS — D509 Iron deficiency anemia, unspecified: Secondary | ICD-10-CM | POA: Diagnosis not present

## 2017-05-08 DIAGNOSIS — N186 End stage renal disease: Secondary | ICD-10-CM | POA: Diagnosis not present

## 2017-05-08 DIAGNOSIS — N2581 Secondary hyperparathyroidism of renal origin: Secondary | ICD-10-CM | POA: Diagnosis not present

## 2017-05-08 NOTE — Anesthesia Preprocedure Evaluation (Addendum)
Anesthesia Evaluation  Patient identified by [name], date of birth, ID band Patient awake    Reviewed: Allergy & Precautions, NPO status , Patient's Chart, lab work & pertinent test results, reviewed documented beta blocker date and time   History of Anesthesia Complications Negative for: history of anesthetic complications  Airway Mallampati: II  TM Distance: >3 FB Neck ROM: Full    Dental no notable dental hx. (+) Teeth Intact, Caps   Pulmonary shortness of breath and with exertion, sleep apnea and Continuous Positive Airway Pressure Ventilation , pneumonia, resolved,    Pulmonary exam normal        Cardiovascular hypertension, Pt. on medications and Pt. on home beta blockers Normal cardiovascular exam  Study Conclusions  - Left ventricle: The cavity size was mildly dilated. Wall thickness was normal. Systolic function was severely reduced. The estimated ejection fraction was in the range of 25% to 30%. There is akinesis of the posterior myocardium. There is severe hypokinesis of the lateral myocardium. There is hypokinesis of the anterior myocardium. Doppler parameters are consistent with abnormal left ventricular relaxation (grade 1 diastolic dysfunction). - Aortic valve: Mild regurgitation. - Aortic root: The aortic root was mildly dilated. - Left atrium: The atrium was mildly dilated.   Neuro/Psych negative neurological ROS  negative psych ROS   GI/Hepatic negative GI ROS, Neg liver ROS,   Endo/Other  Morbid obesityHyperlipidemia   Renal/GU Dialysis and ESRFRenal diseaseLast dialysis yesterday. Labs today WNL  negative genitourinary   Musculoskeletal negative musculoskeletal ROS (+)   Abdominal   Peds  Hematology  (+) anemia ,   Anesthesia Other Findings   Reproductive/Obstetrics                           Anesthesia Physical  Anesthesia Plan  ASA:  IV  Anesthesia Plan: General   Post-op Pain Management:    Induction: Intravenous  PONV Risk Score and Plan: 3 and Ondansetron, Dexamethasone, Treatment may vary due to age or medical condition and Diphenhydramine  Airway Management Planned: LMA  Additional Equipment:   Intra-op Plan:   Post-operative Plan: Extubation in OR  Informed Consent: I have reviewed the patients History and Physical, chart, labs and discussed the procedure including the risks, benefits and alternatives for the proposed anesthesia with the patient or authorized representative who has indicated his/her understanding and acceptance.   Dental advisory given  Plan Discussed with: Anesthesiologist and CRNA  Anesthesia Plan Comments:        Anesthesia Quick Evaluation

## 2017-05-09 ENCOUNTER — Ambulatory Visit (HOSPITAL_COMMUNITY): Payer: Medicare Other | Admitting: Anesthesiology

## 2017-05-09 ENCOUNTER — Encounter (HOSPITAL_COMMUNITY)
Admission: RE | Disposition: A | Discharge status: Home / Self Care | Payer: Self-pay | Source: Ambulatory Visit | Attending: Vascular Surgery

## 2017-05-09 ENCOUNTER — Ambulatory Visit (HOSPITAL_COMMUNITY)
Admission: RE | Admit: 2017-05-09 | Discharge: 2017-05-09 | Disposition: A | Discharge status: Home / Self Care | Payer: Medicare Other | Source: Ambulatory Visit | Attending: Vascular Surgery | Admitting: Vascular Surgery

## 2017-05-09 ENCOUNTER — Encounter (HOSPITAL_COMMUNITY): Payer: Self-pay | Admitting: *Deleted

## 2017-05-09 ENCOUNTER — Other Ambulatory Visit: Payer: Self-pay

## 2017-05-09 DIAGNOSIS — I428 Other cardiomyopathies: Secondary | ICD-10-CM | POA: Diagnosis not present

## 2017-05-09 DIAGNOSIS — I120 Hypertensive chronic kidney disease with stage 5 chronic kidney disease or end stage renal disease: Secondary | ICD-10-CM | POA: Diagnosis not present

## 2017-05-09 DIAGNOSIS — N186 End stage renal disease: Secondary | ICD-10-CM | POA: Insufficient documentation

## 2017-05-09 DIAGNOSIS — E669 Obesity, unspecified: Secondary | ICD-10-CM | POA: Insufficient documentation

## 2017-05-09 DIAGNOSIS — Z7982 Long term (current) use of aspirin: Secondary | ICD-10-CM | POA: Insufficient documentation

## 2017-05-09 DIAGNOSIS — E785 Hyperlipidemia, unspecified: Secondary | ICD-10-CM | POA: Diagnosis not present

## 2017-05-09 DIAGNOSIS — T82898A Other specified complication of vascular prosthetic devices, implants and grafts, initial encounter: Secondary | ICD-10-CM | POA: Diagnosis not present

## 2017-05-09 DIAGNOSIS — G4733 Obstructive sleep apnea (adult) (pediatric): Secondary | ICD-10-CM | POA: Diagnosis not present

## 2017-05-09 DIAGNOSIS — I472 Ventricular tachycardia: Secondary | ICD-10-CM | POA: Diagnosis not present

## 2017-05-09 DIAGNOSIS — I97638 Postprocedural hematoma of a circulatory system organ or structure following other circulatory system procedure: Secondary | ICD-10-CM | POA: Diagnosis not present

## 2017-05-09 DIAGNOSIS — Z992 Dependence on renal dialysis: Secondary | ICD-10-CM | POA: Insufficient documentation

## 2017-05-09 DIAGNOSIS — Z9989 Dependence on other enabling machines and devices: Secondary | ICD-10-CM | POA: Insufficient documentation

## 2017-05-09 DIAGNOSIS — I429 Cardiomyopathy, unspecified: Secondary | ICD-10-CM | POA: Diagnosis not present

## 2017-05-09 DIAGNOSIS — Z87442 Personal history of urinary calculi: Secondary | ICD-10-CM | POA: Insufficient documentation

## 2017-05-09 DIAGNOSIS — Z79899 Other long term (current) drug therapy: Secondary | ICD-10-CM | POA: Insufficient documentation

## 2017-05-09 DIAGNOSIS — T8183XA Persistent postprocedural fistula, initial encounter: Secondary | ICD-10-CM | POA: Diagnosis present

## 2017-05-09 DIAGNOSIS — E213 Hyperparathyroidism, unspecified: Secondary | ICD-10-CM | POA: Insufficient documentation

## 2017-05-09 HISTORY — PX: HEMATOMA EVACUATION: SHX5118

## 2017-05-09 LAB — POCT I-STAT 4, (NA,K, GLUC, HGB,HCT)
Glucose, Bld: 78 mg/dL (ref 65–99)
HCT: 41 % (ref 39.0–52.0)
Hemoglobin: 13.9 g/dL (ref 13.0–17.0)
Potassium: 3.6 mmol/L (ref 3.5–5.1)
Sodium: 139 mmol/L (ref 135–145)

## 2017-05-09 SURGERY — EVACUATION HEMATOMA
Anesthesia: General | Laterality: Left

## 2017-05-09 MED ORDER — LIDOCAINE 2% (20 MG/ML) 5 ML SYRINGE
INTRAMUSCULAR | Status: AC
  Filled 2017-05-09: qty 5

## 2017-05-09 MED ORDER — DEXAMETHASONE SODIUM PHOSPHATE 10 MG/ML IJ SOLN
INTRAMUSCULAR | Status: DC | PRN
  Administered 2017-05-09: 10 mg via INTRAVENOUS

## 2017-05-09 MED ORDER — FENTANYL CITRATE (PF) 250 MCG/5ML IJ SOLN
INTRAMUSCULAR | Status: AC
  Filled 2017-05-09: qty 5

## 2017-05-09 MED ORDER — PROPOFOL 10 MG/ML IV BOLUS
INTRAVENOUS | Status: DC | PRN
  Administered 2017-05-09: 150 mg via INTRAVENOUS

## 2017-05-09 MED ORDER — EPHEDRINE SULFATE 50 MG/ML IJ SOLN
INTRAMUSCULAR | Status: DC | PRN
  Administered 2017-05-09: 20 mg via INTRAVENOUS

## 2017-05-09 MED ORDER — SODIUM CHLORIDE 0.9 % IV SOLN
INTRAVENOUS | Status: DC | PRN
  Administered 2017-05-09: 500 mL

## 2017-05-09 MED ORDER — MIDAZOLAM HCL 5 MG/5ML IJ SOLN
INTRAMUSCULAR | Status: DC | PRN
  Administered 2017-05-09: 2 mg via INTRAVENOUS

## 2017-05-09 MED ORDER — MIDAZOLAM HCL 2 MG/2ML IJ SOLN
INTRAMUSCULAR | Status: AC
  Filled 2017-05-09: qty 2

## 2017-05-09 MED ORDER — FENTANYL CITRATE (PF) 100 MCG/2ML IJ SOLN
INTRAMUSCULAR | Status: DC | PRN
  Administered 2017-05-09: 50 ug via INTRAVENOUS

## 2017-05-09 MED ORDER — 0.9 % SODIUM CHLORIDE (POUR BTL) OPTIME
TOPICAL | Status: DC | PRN
  Administered 2017-05-09: 1000 mL

## 2017-05-09 MED ORDER — LIDOCAINE HCL (PF) 1 % IJ SOLN
INTRAMUSCULAR | Status: AC
  Filled 2017-05-09: qty 30

## 2017-05-09 MED ORDER — OXYCODONE-ACETAMINOPHEN 5-325 MG PO TABS: 1.0000 | ORAL_TABLET | Freq: Four times a day (QID) | ORAL | 0 refills | Status: AC | PRN

## 2017-05-09 MED ORDER — PHENYLEPHRINE HCL 10 MG/ML IJ SOLN
INTRAMUSCULAR | Status: DC | PRN
  Administered 2017-05-09 (×2): 200 ug via INTRAVENOUS

## 2017-05-09 MED ORDER — PROMETHAZINE HCL 25 MG/ML IJ SOLN: 6.2500 mg | INTRAMUSCULAR | Status: DC | PRN

## 2017-05-09 MED ORDER — LIDOCAINE HCL (CARDIAC) 20 MG/ML IV SOLN
INTRAVENOUS | Status: DC | PRN
  Administered 2017-05-09: 100 mg via INTRAVENOUS

## 2017-05-09 MED ORDER — HYDROMORPHONE HCL 1 MG/ML IJ SOLN: 0.2500 mg | INTRAMUSCULAR | Status: DC | PRN

## 2017-05-09 MED ORDER — PROPOFOL 10 MG/ML IV BOLUS
INTRAVENOUS | Status: AC
  Filled 2017-05-09: qty 20

## 2017-05-09 MED ORDER — DEXTROSE 5 % IV SOLN
1.5000 g | INTRAVENOUS | Status: AC
  Administered 2017-05-09: 1.5 g via INTRAVENOUS
  Filled 2017-05-09: qty 1.5

## 2017-05-09 MED ORDER — ONDANSETRON HCL 4 MG/2ML IJ SOLN
INTRAMUSCULAR | Status: DC | PRN
  Administered 2017-05-09: 4 mg via INTRAVENOUS

## 2017-05-09 MED ORDER — SODIUM CHLORIDE 0.9 % IV SOLN
INTRAVENOUS | Status: DC
  Administered 2017-05-09 (×2): via INTRAVENOUS

## 2017-05-09 SURGICAL SUPPLY — 48 items
ADH SKN CLS APL DERMABOND .7 (GAUZE/BANDAGES/DRESSINGS) ×1
AGENT HMST SPONGE THK3/8 (HEMOSTASIS)
ARMBAND PINK RESTRICT EXTREMIT (MISCELLANEOUS) ×2 IMPLANT
BANDAGE ESMARK 6X9 LF (GAUZE/BANDAGES/DRESSINGS) IMPLANT
BNDG CMPR 9X6 STRL LF SNTH (GAUZE/BANDAGES/DRESSINGS)
BNDG ESMARK 6X9 LF (GAUZE/BANDAGES/DRESSINGS)
CANISTER SUCT 3000ML PPV (MISCELLANEOUS) ×2 IMPLANT
CLIP VESOCCLUDE MED 6/CT (CLIP) ×2 IMPLANT
CLIP VESOCCLUDE SM WIDE 6/CT (CLIP) ×2 IMPLANT
COVER PROBE W GEL 5X96 (DRAPES) IMPLANT
CUFF TOURNIQUET SINGLE 18IN (TOURNIQUET CUFF) IMPLANT
CUFF TOURNIQUET SINGLE 24IN (TOURNIQUET CUFF) IMPLANT
CUFF TOURNIQUET SINGLE 34IN LL (TOURNIQUET CUFF) IMPLANT
CUFF TOURNIQUET SINGLE 44IN (TOURNIQUET CUFF) IMPLANT
DECANTER SPIKE VIAL GLASS SM (MISCELLANEOUS) ×2 IMPLANT
DERMABOND ADVANCED (GAUZE/BANDAGES/DRESSINGS) ×1
DERMABOND ADVANCED .7 DNX12 (GAUZE/BANDAGES/DRESSINGS) ×1 IMPLANT
DRAIN CHANNEL 15F RND FF W/TCR (WOUND CARE) IMPLANT
ELECT REM PT RETURN 9FT ADLT (ELECTROSURGICAL) ×2
ELECTRODE REM PT RTRN 9FT ADLT (ELECTROSURGICAL) ×1 IMPLANT
EVACUATOR SILICONE 100CC (DRAIN) IMPLANT
GLOVE BIO SURGEON STRL SZ7 (GLOVE) ×2 IMPLANT
GLOVE BIOGEL PI IND STRL 7.5 (GLOVE) ×1 IMPLANT
GLOVE BIOGEL PI INDICATOR 7.5 (GLOVE) ×1
GOWN STRL REUS W/ TWL LRG LVL3 (GOWN DISPOSABLE) ×3 IMPLANT
GOWN STRL REUS W/TWL LRG LVL3 (GOWN DISPOSABLE) ×6
HEMOSTAT SPONGE AVITENE ULTRA (HEMOSTASIS) IMPLANT
KIT BASIN OR (CUSTOM PROCEDURE TRAY) ×2 IMPLANT
KIT ROOM TURNOVER OR (KITS) ×2 IMPLANT
NS IRRIG 1000ML POUR BTL (IV SOLUTION) ×4 IMPLANT
PACK CV ACCESS (CUSTOM PROCEDURE TRAY) ×2 IMPLANT
PACK GENERAL/GYN (CUSTOM PROCEDURE TRAY) IMPLANT
PACK PERIPHERAL VASCULAR (CUSTOM PROCEDURE TRAY) IMPLANT
PACK UNIVERSAL I (CUSTOM PROCEDURE TRAY) IMPLANT
PAD ARMBOARD 7.5X6 YLW CONV (MISCELLANEOUS) ×4 IMPLANT
STAPLER VISISTAT 35W (STAPLE) IMPLANT
SUT MNCRL AB 4-0 PS2 18 (SUTURE) ×2 IMPLANT
SUT PROLENE 5 0 C 1 24 (SUTURE) IMPLANT
SUT PROLENE 6 0 BV (SUTURE) IMPLANT
SUT PROLENE 7 0 BV 1 (SUTURE) ×2 IMPLANT
SUT VIC AB 2-0 CT1 27 (SUTURE)
SUT VIC AB 2-0 CT1 TAPERPNT 27 (SUTURE) IMPLANT
SUT VIC AB 3-0 SH 27 (SUTURE) ×2
SUT VIC AB 3-0 SH 27X BRD (SUTURE) ×1 IMPLANT
TOWEL GREEN STERILE (TOWEL DISPOSABLE) ×2 IMPLANT
TRAY FOLEY W/METER SILVER 16FR (SET/KITS/TRAYS/PACK) IMPLANT
UNDERPAD 30X30 (UNDERPADS AND DIAPERS) ×2 IMPLANT
WATER STERILE IRR 1000ML POUR (IV SOLUTION) ×2 IMPLANT

## 2017-05-09 NOTE — Transfer of Care (Signed)
Immediate Anesthesia Transfer of Care Note  Patient: Michael [name]  Procedure(s) Performed: EVACUATION HEMATOMA LEFT ARM (Left )  Patient Location: PACU  Anesthesia Type:General  Level of Consciousness: awake and patient cooperative  Airway & Oxygen Therapy: Patient Spontanous Breathing  Post-op Assessment: Report given to RN and Post -op Vital signs reviewed and stable  Post vital signs: Reviewed and stable  Last Vitals:  Vitals:   05/09/17 0609  BP: 112/77  Pulse: 65  Temp: (!) 36.4 C  SpO2: 99%    Last Pain:  Vitals:   05/09/17 0609  TempSrc: Oral      Patients Stated Pain Goal: 3 (46/96/29 [id number])  Complications: No apparent anesthesia complications

## 2017-05-09 NOTE — Anesthesia Procedure Notes (Signed)
Procedure Name: LMA Insertion Date/Time: 05/09/2017 7:36 AM Performed by: Lance Coon, CRNA Pre-anesthesia Checklist: Patient identified, Emergency Drugs available, Suction available, Patient being monitored and Timeout performed Patient Re-evaluated:Patient Re-evaluated prior to induction Oxygen Delivery Method: Circle system utilized Preoxygenation: Pre-oxygenation with 100% oxygen Induction Type: IV induction LMA: LMA inserted LMA Size: 4.0 Number of attempts: 1 Placement Confirmation: positive ETCO2 and breath sounds checked- equal and bilateral Tube secured with: Tape Dental Injury: Teeth and Oropharynx as per pre-operative assessment

## 2017-05-09 NOTE — Op Note (Signed)
    OPERATIVE NOTE   PROCEDURE: 1. Evacuation of left arm hematoma  PRE-OPERATIVE DIAGNOSIS: hematoma vs partial thrombosed pseudoaneurysm  POST-OPERATIVE DIAGNOSIS: same as above   SURGEON: Adele Barthel, MD  ANESTHESIA: general  ESTIMATED BLOOD LOSS: 30 cc  FINDING(S): 1.  20 cc old hematoma 2.  No obvious tract to fistula  SPECIMEN(S):  none  INDICATIONS:   Michael [name] is a 62 y.o. male who presents with infiltration that resulted in residual firm mass overlying his left arm fistula.  I recommended evacuation of the hematoma so he could resume use of the fistula.  Risk, benefits, and alternatives to access surgery were discussed.  The patient is aware the risks include but are not limited to: bleeding, infection, steal syndrome, nerve damage, ischemic monomelic neuropathy, thrombosis, failure to mature, complications related to venous hypertension, need for additional procedures, death and stroke.  The patient agrees to proceed forward with the procedure.   DESCRIPTION: After obtaining full informed written consent, the patient was brought back to the operating room and placed supine upon the operating table.  The patient received IV antibiotics prior to induction.  A procedure time out was completed and the correct surgical site was verified.  After obtaining adequate anesthesia, the patient was prepped and draped in the standard fashion for: left arm access.  I made an incision over the longitudinal axis of the hematoma.  I dissected down to the cavity of the mass.  The wall appeared to be a chronic wall of a hematoma.  I made an incision in the wall and no active bleeding occurred.  I mechanically expressed 20 cc of old hematoma from the cavity.  There was no active bleeding from the fistula.  I washed out the cavity.  There was evidence of any infection in this cavity.  I reapproximated the subcutaneous tissue with a running stitch of 3-0 Vicryl.  The skin was reapproximated with  a running subcuticular stitch of 4-0 Monocryl.  The skin was cleaned, dried, and reinforced with Dermabond.   COMPLICATIONS: none  CONDITION: stable   Adele Barthel, MD, [facility] Office: [phone] Pager: [phone]  05/09/2017, 8:13 AM

## 2017-05-09 NOTE — Anesthesia Postprocedure Evaluation (Signed)
Anesthesia Post Note  Patient: Mccrae Speciale  Procedure(s) Performed: EVACUATION HEMATOMA LEFT ARM (Left )     Patient location during evaluation: PACU Anesthesia Type: General Level of consciousness: sedated Pain management: pain level controlled Vital Signs Assessment: post-procedure vital signs reviewed and stable Respiratory status: spontaneous breathing and respiratory function stable Cardiovascular status: stable Postop Assessment: no apparent nausea or vomiting Anesthetic complications: no    Last Vitals:  Vitals:   05/09/17 0843 05/09/17 0845  BP: 112/81 112/81  Pulse: 67 66  Resp: 17 17  Temp: (!) 36.1 C   SpO2: 98% 97%    Last Pain:  Vitals:   05/09/17 0843  TempSrc:   PainSc: 0-No pain    LLE Motor Response: Purposeful movement;Responds to commands (05/09/17 0843) LLE Sensation: Full sensation (05/09/17 0843)          Duane Boston DANIEL

## 2017-05-09 NOTE — Interval H&P Note (Signed)
History and Physical Interval Note:  05/09/2017 7:11 AM  Michael [name]  has presented today for surgery, with the diagnosis of COMPLICATION WITH FISTULA  The various methods of treatment have been discussed with the patient and family. After consideration of risks, benefits and other options for treatment, the patient has consented to  Procedure(s): EVACUATION HEMATOMA LEFT ARM (Left) REPAIR PSUEDOANEURYSM OF ARTERIOVENOUS FISTULA LEFT ARM (Left) as a surgical intervention .  The patient's history has been reviewed, patient examined, no change in status, stable for surgery.  I have reviewed the patient's chart and labs.  Questions were answered to the patient's satisfaction.     Adele Barthel

## 2017-05-10 ENCOUNTER — Encounter (HOSPITAL_COMMUNITY): Payer: Self-pay | Admitting: Vascular Surgery

## 2017-05-10 ENCOUNTER — Telehealth: Payer: Self-pay | Admitting: Vascular Surgery

## 2017-05-10 DIAGNOSIS — I120 Hypertensive chronic kidney disease with stage 5 chronic kidney disease or end stage renal disease: Secondary | ICD-10-CM | POA: Diagnosis not present

## 2017-05-10 DIAGNOSIS — N2581 Secondary hyperparathyroidism of renal origin: Secondary | ICD-10-CM | POA: Diagnosis not present

## 2017-05-10 DIAGNOSIS — D509 Iron deficiency anemia, unspecified: Secondary | ICD-10-CM | POA: Diagnosis not present

## 2017-05-10 DIAGNOSIS — N186 End stage renal disease: Secondary | ICD-10-CM | POA: Diagnosis not present

## 2017-05-10 DIAGNOSIS — Z48812 Encounter for surgical aftercare following surgery on the circulatory system: Secondary | ICD-10-CM | POA: Diagnosis not present

## 2017-05-10 DIAGNOSIS — Z992 Dependence on renal dialysis: Secondary | ICD-10-CM | POA: Diagnosis not present

## 2017-05-10 NOTE — Telephone Encounter (Signed)
-----   Message from Mena Goes, RN sent at 05/09/2017 11:08 AM EST ----- Regarding: 4 weeks postop evacuation of hematoma   ----- Message ----- From: Conrad Honaker, MD Sent: 05/09/2017   8:18 AM To: [facility] Charge Pool  Michael [name] [id number] [birth year]  PROCEDURE: 1. Evacuation of left arm hematoma  Follow-up: 4 weeks

## 2017-05-10 NOTE — Telephone Encounter (Signed)
Sched appt 06/07/17 at 1:00. Spoke to pt.

## 2017-05-12 DIAGNOSIS — I120 Hypertensive chronic kidney disease with stage 5 chronic kidney disease or end stage renal disease: Secondary | ICD-10-CM | POA: Diagnosis not present

## 2017-05-12 DIAGNOSIS — N2581 Secondary hyperparathyroidism of renal origin: Secondary | ICD-10-CM | POA: Diagnosis not present

## 2017-05-12 DIAGNOSIS — Z992 Dependence on renal dialysis: Secondary | ICD-10-CM | POA: Diagnosis not present

## 2017-05-12 DIAGNOSIS — D509 Iron deficiency anemia, unspecified: Secondary | ICD-10-CM | POA: Diagnosis not present

## 2017-05-12 DIAGNOSIS — Z48812 Encounter for surgical aftercare following surgery on the circulatory system: Secondary | ICD-10-CM | POA: Diagnosis not present

## 2017-05-12 DIAGNOSIS — N186 End stage renal disease: Secondary | ICD-10-CM | POA: Diagnosis not present

## 2017-05-15 DIAGNOSIS — D509 Iron deficiency anemia, unspecified: Secondary | ICD-10-CM | POA: Diagnosis not present

## 2017-05-15 DIAGNOSIS — N2581 Secondary hyperparathyroidism of renal origin: Secondary | ICD-10-CM | POA: Diagnosis not present

## 2017-05-15 DIAGNOSIS — N186 End stage renal disease: Secondary | ICD-10-CM | POA: Diagnosis not present

## 2017-05-17 DIAGNOSIS — N2581 Secondary hyperparathyroidism of renal origin: Secondary | ICD-10-CM | POA: Diagnosis not present

## 2017-05-17 DIAGNOSIS — Z48812 Encounter for surgical aftercare following surgery on the circulatory system: Secondary | ICD-10-CM | POA: Diagnosis not present

## 2017-05-17 DIAGNOSIS — N186 End stage renal disease: Secondary | ICD-10-CM | POA: Diagnosis not present

## 2017-05-17 DIAGNOSIS — I120 Hypertensive chronic kidney disease with stage 5 chronic kidney disease or end stage renal disease: Secondary | ICD-10-CM | POA: Diagnosis not present

## 2017-05-17 DIAGNOSIS — D509 Iron deficiency anemia, unspecified: Secondary | ICD-10-CM | POA: Diagnosis not present

## 2017-05-17 DIAGNOSIS — Z992 Dependence on renal dialysis: Secondary | ICD-10-CM | POA: Diagnosis not present

## 2017-05-18 NOTE — Progress Notes (Signed)
[facility] Note  05/19/2017     CHIEF COMPLAINT Patient presents for Retina Follow Up and Blurred Vision   HISTORY OF PRESENT ILLNESS: Michael [name] is a 62 y.o. male who presents to the clinic today for:   HPI    Retina Follow Up    Patient presents with  PVD.  In right eye.  This started 1 month ago.  Severity is mild.  Since onset it is gradually improving.  I, the attending physician,  performed the HPI with the patient and updated documentation appropriately.          Blurred Vision    In right eye.  Onset was sudden.  Vision is blurred and difficult to focus.  Severity is mild.  This started 1 month ago.  Occurring constantly.  It is worse throughout the day, in the morning and in the evening.  Context:  near vision, distance vision, reading, watching TV and driving.  Since onset it is gradually improving.  Associated symptoms include Negative for glare, haloes, double vision, a need for brighter lights, dryness, eye pain, redness, tearing, trauma, pain with eye movement, abnormal color vision, headache, scalp tenderness, jaw claudication, shoulder/hip pain, fever, weight loss and fatigue.  Treatments tried include no treatments.  I, the attending physician,  performed the HPI with the patient and updated documentation appropriately.          Comments    F/U BRVO w/CME OD. Patient states his" right eye is not as blurry as it was" " I think my vision is getting better". Denies floater, flashes and ocular pain. Completed Pred Forte yesterday (05-18-17) . Denies vits.       Last edited by [name], MD on 05/19/2017 10:19 PM. (History)     Pt states OU VA is stable; Pt reports he tolerated laser well last visit; pt reports he is prepared to have injection today;   Referring physician: [name], MD [place], [place] [address]  HISTORICAL INFORMATION:   Selected notes from the [facility] Referred by Dr.  [name]. Hecker for concern of HRVO OD; LEE- 11.30.18 ([name]) {BCVA OD: 20/100-1 OS: 20/20-2] Ocular Hx- cataract OU PMH- HTN, high chol, kidney disease, sleep apnea, emphysema    CURRENT MEDICATIONS: No current outpatient medications on file. (Ophthalmic Drugs)   No current facility-administered medications for this visit.  (Ophthalmic Drugs)   Current Outpatient Medications (Other)  Medication Sig  . aspirin EC 81 MG EC tablet Take 1 tablet (81 mg total) by mouth daily.  [name] atorvastatin (LIPITOR) 40 MG tablet Take 40 mg by mouth every evening.   . calcium acetate (PHOSLO) 667 MG capsule Take 1,334 mg by mouth 3 (three) times daily with meals.   . metoprolol succinate (TOPROL-XL) 100 MG 24 hr tablet Take 50 mg by mouth See admin instructions. Take 50 mg by mouth daily on Tuesday, Thursday and Saturday.  . nitroGLYCERIN (NITROSTAT) 0.4 MG SL tablet Place 1 tablet (0.4 mg total) under the tongue every 5 (five) minutes x 3 doses as needed for chest pain.  . SENSIPAR 60 MG tablet Take 60 mg by mouth every evening.   [name] oxyCODONE-acetaminophen (ROXICET) 5-325 MG tablet Take 1 tablet by mouth every 6 (six) hours as needed. (Patient not taking: Reported on 05/19/2017)   Current Facility-Administered Medications (Other)  Medication Route  . Bevacizumab (AVASTIN) SOLN 1.25 mg Intravitreal  . Bevacizumab (AVASTIN) SOLN 1.25 mg Intravitreal  REVIEW OF SYSTEMS: ROS    Positive for: Genitourinary, Cardiovascular, Eyes   Negative for: Constitutional, Gastrointestinal, Neurological, Skin, Musculoskeletal, HENT, Endocrine, Respiratory, Psychiatric, Allergic/Imm, Heme/Lymph   Last edited by Zenovia Jordan, LPN on 0/62/6948  5:46 AM. (History)       ALLERGIES No Known Allergies  PAST MEDICAL HISTORY Past Medical History:  Diagnosis Date  . Dyspnea    on exertion  . ESRD (end stage renal disease) ([place])    Hemo- MWF, Polycystic kidney disease  . Fatigue   . History of kidney stones     removal of stone- cysto  . Hyperlipidemia   . Hyperparathyroidism, secondary renal ([place])   . Hypertension   . Hypoxemia 12/12/2013  . Nonischemic cardiomyopathy ([place])    Er 25% 2015, 55 % 2013  . OSA on CPAP    no longer using cpap  . OSA on CPAP 03/24/2014  . Pneumonia    2015ish  . Ventricular tachycardia//Freq PVCs    Past Surgical History:  Procedure Laterality Date  . A/V FISTULAGRAM Left 04/27/2017   Procedure: A/V FISTULAGRAM;  Surgeon: Conrad Garvin, MD;  Location: [facility];  Service: Cardiovascular;  Laterality: Left;  lt arm  . APPENDECTOMY    . AV FISTULA PLACEMENT  12/05/2011   Procedure: ARTERIOVENOUS (AV) FISTULA CREATION;LLEFT ARM  Surgeon: Conrad [place], MD;  Location: [facility];  Service: Vascular;  Laterality: Left;  RADIO-CEPHALIC  fistula left arm  . AV FISTULA PLACEMENT  01/11/2012   Procedure: ARTERIOVENOUS (AV) FISTULA CREATION;  Surgeon: Conrad Dufur, MD;  Location: [facility];  Service: Vascular;  Laterality: Left;  Creation of left brachial cephalic arteriovenous fistula  . BASCILIC VEIN TRANSPOSITION Left 12/27/2016   Procedure: BASILIC VEIN TRANSPOSITION LEFT UPPER ARM FIRST STAGE;  Surgeon: Conrad Bloomfield, MD;  Location: [facility];  Service: Vascular;  Laterality: Left;  . BASCILIC VEIN TRANSPOSITION Left 01/31/2017   Procedure: LEFT ARM BASILIC VEIN TRANSPOSITION, SECOND STAGE;  Surgeon: Conrad Liberty, MD;  Location: [facility];  Service: Vascular;  Laterality: Left;  . CARDIAC CATHETERIZATION  04-05-2010   checking for blockage but none-[facility]  . COLONOSCOPY    . CYSTOSCOPY W/ STONE MANIPULATION     "laser once" (01/22/2013)  . HEMATOMA EVACUATION Left 05/09/2017   Procedure: EVACUATION HEMATOMA LEFT ARM;  Surgeon: Conrad [place], MD;  Location: [facility];  Service: Vascular;  Laterality: Left;  . HERNIA REPAIR    . INGUINAL HERNIA REPAIR Right 11/06/2015   Procedure: OPEN HERNIA REPAIR  RIGHT INGUINAL ADULT;  Surgeon: [name], MD;  Location: [facility];  Service: General;   Laterality: Right;  with MESH  . INSERTION OF DIALYSIS CATHETER Right 10/05/2016   Procedure: INSERTION OF right internal jugular DIALYSIS CATHETER;  Surgeon: [name], MD;  Location: [facility];  Service: Vascular;  Laterality: Right;  . INSERTION OF MESH  03/20/2012   Procedure: INSERTION OF MESH;  UMB Surgeon: [name], MD;  Location: [facility];  Service: General;  Laterality: N/A;  . INSERTION OF MESH N/A 01/22/2013   Procedure: INSERTION OF MESH;  Surgeon: [name], MD;  Location: [facility];  Service: General;  Laterality: N/A;  . LAPAROTOMY  04/02/2012   Procedure: EXPLORATORY LAPAROTOMY;  Surgeon: [name], MD;  Location: [facility];  Service: General;  Laterality: N/A;  Exploratory Laparotomy with resection of small intestine  . LEFT HEART CATHETERIZATION WITH CORONARY ANGIOGRAM N/A 05/14/2013   Procedure: LEFT HEART CATHETERIZATION WITH CORONARY ANGIOGRAM;  Surgeon: Sinclair Grooms, MD;  Location: [facility];  Service: Cardiovascular;  Laterality: N/A;  . LIGATION OF ARTERIOVENOUS  FISTULA Left 12/27/2016   Procedure: LIGATION/EXCISION OF LEFT UPPER ARM ARTERIOVENOUS  FISTULA;  EVACUATION OF HEMATOMA;  Surgeon: Conrad Meadowlakes, MD;  Location: [facility];  Service: Vascular;  Laterality: Left;  . REVISON OF ARTERIOVENOUS FISTULA Left 10/05/2016   Procedure: REVISON OF left arm ARTERIOVENOUS FISTULA;  Surgeon: [name], MD;  Location: [facility];  Service: Vascular;  Laterality: Left;  . TONSILLECTOMY    . UMBILICAL HERNIA REPAIR  03/20/2012   Procedure: HERNIA REPAIR UMBILICAL ADULT;  Surgeon: [name], MD;  Location: [facility];  Service: General;  Laterality: N/A;  . UMBILICAL HERNIA REPAIR  01/22/2013   preperitoneal open procedure due to significant adhesions/notes 01/22/2013  . VENTRAL HERNIA REPAIR N/A 01/22/2013   Procedure: ATTEMPTED LAPAROSCOPIC VENTRAL HERNIA CONVERTED TO OPEN;  Surgeon: [name], MD;  Location[facility];  Service: General;  Laterality: N/A;     FAMILY HISTORY Family History  Problem Relation Age of Onset  . Heart disease Mother   . Hyperlipidemia Mother   . Hypertension Mother   . Kidney disease Father   . Stroke Father   . Kidney disease Brother   . Amblyopia Neg Hx   . Blindness Neg Hx   . Cataracts Neg Hx   . Diabetes Neg Hx   . Glaucoma Neg Hx   . Macular degeneration Neg Hx   . Retinal detachment Neg Hx   . Strabismus Neg Hx   . Retinitis pigmentosa Neg Hx     SOCIAL HISTORY Social History   Tobacco Use  . Smoking status: Never Smoker  . Smokeless tobacco: Never Used  Substance Use Topics  . Alcohol use: No    Alcohol/week: 0.0 oz  . Drug use: No         OPHTHALMIC EXAM:  Base Eye Exam    Visual Acuity (Snellen - Linear)      Right Left   Dist [place] 20/100 +2 20/40 -1   Dist ph [place] 20/60 20/20 -1       Tonometry (Tonopen, 8:57 AM)      Right Left   Pressure 17 20       Pupils      Dark Light Shape React APD   Right 4 4 Round Minimal None   Left 4 4 Round Minimal None       Visual Fields (Counting fingers)      Left Right    Full Full       Extraocular Movement      Right Left    Full, Ortho Full, Ortho       Neuro/Psych    Oriented x3:  Yes   Mood/Affect:  Normal       Dilation    Both eyes:  1.0% Mydriacyl, 2.5% Phenylephrine @ 8:57 AM        Slit Lamp and Fundus Exam    Slit Lamp Exam      Right Left   Lids/Lashes Dermatochalasis - upper lid Dermatochalasis - upper lid   Conjunctiva/Sclera Mild Melanosis Mild Melanosis   Cornea Arcus, scattered mild K haze - greatest inferiorly Arcus   Anterior Chamber Deep and quiet Deep and quiet   Iris Round and dilated Round and dilated   Lens 2+ Nuclear sclerosis, 2+ Cortical cataract 2+ Nuclear sclerosis, 2+ Cortical cataract   Vitreous Vitreous syneresis Vitreous syneresis  Fundus Exam      Right Left   Disc vascular loops superiorly Normal   C/D Ratio 0.4 0.55   Macula Blunted foveal reflex, dark intraretinal  hemorrhage SN to macula improved, scattered DBH superior macula Flat, Good foveal reflex, No heme or edema   Vessels Vascular attenuation, dilated and Tortuous, Copper wiring superiorly Mild Copper wiring, mildly Tortuous   Periphery Attached, DBH superiorly and temporally Attached, pigmented lattice with 3 atrophic holes at 1030        Refraction    Wearing Rx    Type:  Bifocal          IMAGING AND PROCEDURES  Imaging and Procedures for 05/19/17  OCT, Retina - OU - Both Eyes     Right Eye Quality was good. Central Foveal Thickness: 255. Progression has improved. Findings include normal foveal contour, outer retinal atrophy, intraretinal fluid, no SRF.   Left Eye Quality was good. Central Foveal Thickness: 266. Progression has been stable. Findings include normal foveal contour, no IRF, no SRF, epiretinal membrane.   Notes *Images captured and stored on drive  Diagnosis / Impression:  OD: focal area of retinal edema/IRF SN to fovea -- interval decrease in retinal edema, interval improvement in IRF OS: trace ERM  Clinical management:  See below  Abbreviations: NFP - Normal foveal profile. CME - cystoid macular edema. PED - pigment epithelial detachment. IRF - intraretinal fluid. SRF - subretinal fluid. EZ - ellipsoid zone. ERM - epiretinal membrane. ORA - outer retinal atrophy. ORT - outer retinal tubulation. SRHM - subretinal hyper-reflective material         Intravitreal Injection, Pharmacologic Agent - OD - Right Eye     Time Out 05/19/2017. 10:09 AM. Confirmed correct patient, procedure, site, and patient consented.   Anesthesia Topical anesthesia was used. Anesthetic medications included Lidocaine 2%, Tetracaine 0.5%.   Procedure Preparation included 5% betadine to ocular surface, eyelid speculum. A supplied needle was used.   Injection: 1.25 mg Bevacizumab 1.25mg /0.30ml   NDC: 04540-981-19    Lot: [id number]     Expiration Date: 06/05/2017   Route:  Intravitreal   Site: Right Eye   Waste: 0 mg  Post-op Post injection exam found visual acuity of at least counting fingers. The patient tolerated the procedure well. There were no complications. The patient received written and verbal post procedure care education.                 ASSESSMENT/PLAN:    ICD-10-CM   1. Branch retinal vein occlusion of right eye with macular edema H34.8310 Intravitreal Injection, Pharmacologic Agent - OD - Right Eye    Bevacizumab (AVASTIN) SOLN 1.25 mg  2. Retinal edema H35.81 OCT, Retina - OU - Both Eyes  3. Lattice degeneration of left retina H35.412   4. Atrophic retinal break, multiple, left eye H33.332   5. Combined forms of age-related cataract of both eyes H25.813    1,2. BRVO w/ CME OD - s/p IVA OD #1 (12.21.18) - repeat OCT shows interval improvement in central CME tracking from sup nasal macula and interval improvement in IRF - IVA OD #2 recommended today (01.18.19) - pt wishes to proceed - RBA of procedure discussed, questions answered - informed consent obtained and signed - see procedure note - F/U 4 weeks -- DFE/OCT/possible injection  3,4. Lattice degeneration with atrophic holes OS-  - peripheral holes superiorly at 1030 without SRF or RD - S/P laser retinopexy OS (01.04.19) - good laser in place  5.  Combined forms age-related cataract OU-  - The symptoms of cataract, surgical options, and treatments and risks were discussed with patient. - discussed diagnosis and progression - not yet visually significant - monitor for now   Ophthalmic Meds Ordered this visit:  Meds ordered this encounter  Medications  . Bevacizumab (AVASTIN) SOLN 1.25 mg       Return in about 4 weeks (around 06/16/2017) for Dilated Exam, OCT, Possible Injxn.  There are no Patient Instructions on file for this visit.   Explained the diagnoses, plan, and follow up with the patient and they expressed understanding.  Patient expressed understanding  of the importance of proper follow up care.   This document serves as a record of services personally performed by Gardiner Sleeper, MD, PhD. It was created on their behalf by Catha Brow, [place], a certified ophthalmic assistant. The creation of this record is the provider's dictation and/or activities during the visit.  Electronically signed by: Catha Brow, COA  05/19/17 10:35 PM     Gardiner Sleeper, M.D., Ph.D. Diseases & Surgery of the Retina and [place] 05/19/17   I have reviewed the above documentation for accuracy and completeness, and I agree with the above. Gardiner Sleeper, M.D., Ph.D. 05/19/17 10:35 PM     Abbreviations: M myopia (nearsighted); A astigmatism; H hyperopia (farsighted); P presbyopia; Mrx spectacle prescription;  CTL contact lenses; OD right eye; OS left eye; OU both eyes  XT exotropia; ET esotropia; PEK punctate epithelial keratitis; PEE punctate epithelial erosions; DES dry eye syndrome; MGD meibomian gland dysfunction; ATs artificial tears; PFAT's preservative free artificial tears; [place] nuclear sclerotic cataract; PSC posterior subcapsular cataract; ERM epi-retinal membrane; PVD posterior vitreous detachment; RD retinal detachment; DM diabetes mellitus; DR diabetic retinopathy; NPDR non-proliferative diabetic retinopathy; PDR proliferative diabetic retinopathy; CSME clinically significant macular edema; DME diabetic macular edema; dbh dot blot hemorrhages; CWS cotton wool spot; POAG primary open angle glaucoma; C/D cup-to-disc ratio; HVF humphrey visual field; GVF goldmann visual field; OCT optical coherence tomography; IOP intraocular pressure; BRVO Branch retinal vein occlusion; CRVO central retinal vein occlusion; CRAO central retinal artery occlusion; BRAO branch retinal artery occlusion; RT retinal tear; SB scleral buckle; PPV pars plana vitrectomy; VH Vitreous hemorrhage; PRP panretinal laser photocoagulation; IVK  intravitreal kenalog; VMT vitreomacular traction; MH Macular hole;  NVD neovascularization of the disc; NVE neovascularization elsewhere; AREDS age related eye disease study; ARMD age related macular degeneration; POAG primary open angle glaucoma; EBMD epithelial/anterior basement membrane dystrophy; ACIOL anterior chamber intraocular lens; IOL intraocular lens; PCIOL posterior chamber intraocular lens; Phaco/IOL phacoemulsification with intraocular lens placement; [place] photorefractive keratectomy; LASIK laser assisted in situ keratomileusis; HTN hypertension; DM diabetes mellitus; COPD chronic obstructive pulmonary disease

## 2017-05-19 ENCOUNTER — Encounter (INDEPENDENT_AMBULATORY_CARE_PROVIDER_SITE_OTHER): Payer: Self-pay | Admitting: Ophthalmology

## 2017-05-19 ENCOUNTER — Ambulatory Visit (INDEPENDENT_AMBULATORY_CARE_PROVIDER_SITE_OTHER): Payer: Medicare Other | Admitting: Ophthalmology

## 2017-05-19 DIAGNOSIS — H25813 Combined forms of age-related cataract, bilateral: Secondary | ICD-10-CM

## 2017-05-19 DIAGNOSIS — H3581 Retinal edema: Secondary | ICD-10-CM

## 2017-05-19 DIAGNOSIS — H35412 Lattice degeneration of retina, left eye: Secondary | ICD-10-CM

## 2017-05-19 DIAGNOSIS — H33332 Multiple defects of retina without detachment, left eye: Secondary | ICD-10-CM

## 2017-05-19 DIAGNOSIS — H34831 Tributary (branch) retinal vein occlusion, right eye, with macular edema: Secondary | ICD-10-CM | POA: Diagnosis not present

## 2017-05-19 MED ORDER — BEVACIZUMAB CHEMO INJECTION 1.25MG/0.05ML SYRINGE FOR KALEIDOSCOPE
1.2500 mg | INTRAVITREAL | Status: DC
  Administered 2017-05-19: 1.25 mg via INTRAVITREAL

## 2017-05-20 DIAGNOSIS — N2581 Secondary hyperparathyroidism of renal origin: Secondary | ICD-10-CM | POA: Diagnosis not present

## 2017-05-20 DIAGNOSIS — D509 Iron deficiency anemia, unspecified: Secondary | ICD-10-CM | POA: Diagnosis not present

## 2017-05-20 DIAGNOSIS — N186 End stage renal disease: Secondary | ICD-10-CM | POA: Diagnosis not present

## 2017-05-22 DIAGNOSIS — N186 End stage renal disease: Secondary | ICD-10-CM | POA: Diagnosis not present

## 2017-05-22 DIAGNOSIS — D509 Iron deficiency anemia, unspecified: Secondary | ICD-10-CM | POA: Diagnosis not present

## 2017-05-22 DIAGNOSIS — N2581 Secondary hyperparathyroidism of renal origin: Secondary | ICD-10-CM | POA: Diagnosis not present

## 2017-05-24 DIAGNOSIS — I120 Hypertensive chronic kidney disease with stage 5 chronic kidney disease or end stage renal disease: Secondary | ICD-10-CM | POA: Diagnosis not present

## 2017-05-24 DIAGNOSIS — Z48812 Encounter for surgical aftercare following surgery on the circulatory system: Secondary | ICD-10-CM | POA: Diagnosis not present

## 2017-05-24 DIAGNOSIS — N2581 Secondary hyperparathyroidism of renal origin: Secondary | ICD-10-CM | POA: Diagnosis not present

## 2017-05-24 DIAGNOSIS — N186 End stage renal disease: Secondary | ICD-10-CM | POA: Diagnosis not present

## 2017-05-24 DIAGNOSIS — D509 Iron deficiency anemia, unspecified: Secondary | ICD-10-CM | POA: Diagnosis not present

## 2017-05-24 DIAGNOSIS — Z992 Dependence on renal dialysis: Secondary | ICD-10-CM | POA: Diagnosis not present

## 2017-05-26 DIAGNOSIS — N2581 Secondary hyperparathyroidism of renal origin: Secondary | ICD-10-CM | POA: Diagnosis not present

## 2017-05-26 DIAGNOSIS — Z992 Dependence on renal dialysis: Secondary | ICD-10-CM | POA: Diagnosis not present

## 2017-05-26 DIAGNOSIS — N186 End stage renal disease: Secondary | ICD-10-CM | POA: Diagnosis not present

## 2017-05-26 DIAGNOSIS — I120 Hypertensive chronic kidney disease with stage 5 chronic kidney disease or end stage renal disease: Secondary | ICD-10-CM | POA: Diagnosis not present

## 2017-05-26 DIAGNOSIS — D509 Iron deficiency anemia, unspecified: Secondary | ICD-10-CM | POA: Diagnosis not present

## 2017-05-26 DIAGNOSIS — Z48812 Encounter for surgical aftercare following surgery on the circulatory system: Secondary | ICD-10-CM | POA: Diagnosis not present

## 2017-05-29 DIAGNOSIS — D509 Iron deficiency anemia, unspecified: Secondary | ICD-10-CM | POA: Diagnosis not present

## 2017-05-29 DIAGNOSIS — N2581 Secondary hyperparathyroidism of renal origin: Secondary | ICD-10-CM | POA: Diagnosis not present

## 2017-05-29 DIAGNOSIS — N186 End stage renal disease: Secondary | ICD-10-CM | POA: Diagnosis not present

## 2017-05-31 DIAGNOSIS — N186 End stage renal disease: Secondary | ICD-10-CM | POA: Diagnosis not present

## 2017-05-31 DIAGNOSIS — N2581 Secondary hyperparathyroidism of renal origin: Secondary | ICD-10-CM | POA: Diagnosis not present

## 2017-05-31 DIAGNOSIS — D509 Iron deficiency anemia, unspecified: Secondary | ICD-10-CM | POA: Diagnosis not present

## 2017-06-01 DIAGNOSIS — N186 End stage renal disease: Secondary | ICD-10-CM | POA: Diagnosis not present

## 2017-06-01 DIAGNOSIS — Z992 Dependence on renal dialysis: Secondary | ICD-10-CM | POA: Diagnosis not present

## 2017-06-01 DIAGNOSIS — Q612 Polycystic kidney, adult type: Secondary | ICD-10-CM | POA: Diagnosis not present

## 2017-06-01 NOTE — Progress Notes (Signed)
    Postoperative Access Visit   History of Present Illness   Michael [name] is a 62 y.o. year old male who presents for postoperative follow-up for: left arm hematoma evacuation (Date: 05/09/17).  The patient's wounds are well healed.  The patient notes no steal symptoms.  The patient is  able to complete their activities of daily living.  He reprots no change in his medical history since he was last seen.   Physical Examination   Vitals:   06/07/17 1301  BP: 110/73  Pulse: 87  Resp: 18  Temp: 98.6 F (37 C)  TempSrc: Oral  SpO2: 97%  Weight: 186 lb (84.4 kg)  Height: 5\' 1"  (1.549 m)   Body mass index is 35.14 kg/m.  left arm Incision is well healed, skin feels WNL, hand grip is 5/5, sensation in digits is  intact, palpable thrill, bruit can  be auscultated     Medical Decision Making   Michael [name] is a 62 y.o. year old male who presents s/p left evacuation of hematoma   The patient's access is  ready for use.  Please use smaller gauge needles and work up to larger ones as his fistula tolerats  The patient's tunneled dialysis catheter can be removed after two successful cannulations and completed dialysis treatments.  Thank you for allowing [place] to participate in this patient's care.  Roxy Horseman PA-C The patient was seen in conjunction with Dr. Bridgett Larsson  Addendum  I have independently interviewed and examined the patient, and I agree with the physician assistant's findings.    Adele Barthel, MD, FACS [facility] Office: [phone] Pager: [phone]  06/07/2017, 1:22 PM

## 2017-06-02 DIAGNOSIS — Q612 Polycystic kidney, adult type: Secondary | ICD-10-CM | POA: Diagnosis not present

## 2017-06-02 DIAGNOSIS — Z992 Dependence on renal dialysis: Secondary | ICD-10-CM | POA: Diagnosis not present

## 2017-06-02 DIAGNOSIS — N2581 Secondary hyperparathyroidism of renal origin: Secondary | ICD-10-CM | POA: Diagnosis not present

## 2017-06-02 DIAGNOSIS — N186 End stage renal disease: Secondary | ICD-10-CM | POA: Diagnosis not present

## 2017-06-05 DIAGNOSIS — N186 End stage renal disease: Secondary | ICD-10-CM | POA: Diagnosis not present

## 2017-06-05 DIAGNOSIS — N2581 Secondary hyperparathyroidism of renal origin: Secondary | ICD-10-CM | POA: Diagnosis not present

## 2017-06-07 ENCOUNTER — Encounter: Payer: Self-pay | Admitting: Vascular Surgery

## 2017-06-07 ENCOUNTER — Ambulatory Visit (INDEPENDENT_AMBULATORY_CARE_PROVIDER_SITE_OTHER): Payer: Medicare Other | Admitting: Vascular Surgery

## 2017-06-07 VITALS — BP 110/73 | HR 87 | Temp 98.6°F | Resp 18 | Ht 61.0 in | Wt 186.0 lb

## 2017-06-07 DIAGNOSIS — N186 End stage renal disease: Secondary | ICD-10-CM | POA: Diagnosis not present

## 2017-06-07 DIAGNOSIS — N2581 Secondary hyperparathyroidism of renal origin: Secondary | ICD-10-CM | POA: Diagnosis not present

## 2017-06-07 DIAGNOSIS — Z992 Dependence on renal dialysis: Secondary | ICD-10-CM

## 2017-06-09 DIAGNOSIS — N2581 Secondary hyperparathyroidism of renal origin: Secondary | ICD-10-CM | POA: Diagnosis not present

## 2017-06-09 DIAGNOSIS — N186 End stage renal disease: Secondary | ICD-10-CM | POA: Diagnosis not present

## 2017-06-12 DIAGNOSIS — N2581 Secondary hyperparathyroidism of renal origin: Secondary | ICD-10-CM | POA: Diagnosis not present

## 2017-06-12 DIAGNOSIS — N186 End stage renal disease: Secondary | ICD-10-CM | POA: Diagnosis not present

## 2017-06-14 DIAGNOSIS — N2581 Secondary hyperparathyroidism of renal origin: Secondary | ICD-10-CM | POA: Diagnosis not present

## 2017-06-14 DIAGNOSIS — N186 End stage renal disease: Secondary | ICD-10-CM | POA: Diagnosis not present

## 2017-06-15 NOTE — Progress Notes (Signed)
[facility] Note  06/16/2017     CHIEF COMPLAINT Patient presents for Retina Follow Up   HISTORY OF PRESENT ILLNESS: Michael [name] is a 62 y.o. male who presents to the clinic today for:   HPI    Retina Follow Up    Patient presents with  CRVO/BRVO.  In right eye.  Severity is moderate.  Duration of 4 weeks.  Since onset it is gradually improving.  I, the attending physician,  performed the HPI with the patient and updated documentation appropriately.          Comments    Pt presents today for F/U of BRVO OD with CME, pt states VA is much improved, pt denies flashes, floaters, pain or wavy vision, pt denies the use of gtts, pt denies being diabetic,        Last edited by [name], MD on 06/16/2017  9:02 AM. (History)     Pt states OU VA has improved since last visit;   Referring physician: [name], MD [place], [place] [address]  HISTORICAL INFORMATION:   Selected notes from the MEDICAL RECORD NUMBER Referred by Dr. [name]. Hecker for concern of HRVO OD; LEE- 11.30.18 ([name]) {BCVA OD: 20/100-1 OS: 20/20-2] Ocular Hx- cataract OU PMH- HTN, high chol, kidney disease, sleep apnea, emphysema    CURRENT MEDICATIONS: No current outpatient medications on file. (Ophthalmic Drugs)   No current facility-administered medications for this visit.  (Ophthalmic Drugs)   Current Outpatient Medications (Other)  Medication Sig  . aspirin EC 81 MG EC tablet Take 1 tablet (81 mg total) by mouth daily.  [name] atorvastatin (LIPITOR) 40 MG tablet Take 40 mg by mouth every evening.   . calcium acetate (PHOSLO) 667 MG capsule Take 1,334 mg by mouth 3 (three) times daily with meals.   . metoprolol succinate (TOPROL-XL) 100 MG 24 hr tablet Take 50 mg by mouth See admin instructions. Take 50 mg by mouth daily on Tuesday, Thursday and Saturday.  . nitroGLYCERIN (NITROSTAT) 0.4 MG SL tablet Place 1 tablet (0.4 mg total) under the tongue  every 5 (five) minutes x 3 doses as needed for chest pain.  [name] oxyCODONE-acetaminophen (ROXICET) 5-325 MG tablet Take 1 tablet by mouth every 6 (six) hours as needed.  . SENSIPAR 60 MG tablet Take 60 mg by mouth every evening.    Current Facility-Administered Medications (Other)  Medication Route  . Bevacizumab (AVASTIN) SOLN 1.25 mg Intravitreal  . Bevacizumab (AVASTIN) SOLN 1.25 mg Intravitreal  . Bevacizumab (AVASTIN) SOLN 1.25 mg Intravitreal      REVIEW OF SYSTEMS: ROS    Positive for: Cardiovascular, Eyes   Negative for: Constitutional, Gastrointestinal, Neurological, Skin, Genitourinary, Musculoskeletal, HENT, Endocrine, Respiratory, Psychiatric, Allergic/Imm, Heme/Lymph   Last edited by Debbrah Alar, COT on 06/16/2017  8:34 AM. (History)       ALLERGIES No Known Allergies  PAST MEDICAL HISTORY Past Medical History:  Diagnosis Date  . Dyspnea    on exertion  . ESRD (end stage renal disease) ([place])    Hemo- MWF, Polycystic kidney disease  . Fatigue   . History of kidney stones    removal of stone- cysto  . Hyperlipidemia   . Hyperparathyroidism, secondary renal ([place])   . Hypertension   . Hypoxemia 12/12/2013  . Nonischemic cardiomyopathy ([place])    Er 25% 2015, 55 % 2013  . OSA on CPAP    no longer using cpap  .  OSA on CPAP 03/24/2014  . Pneumonia    2015ish  . Ventricular tachycardia//Freq PVCs    Past Surgical History:  Procedure Laterality Date  . A/V FISTULAGRAM Left 04/27/2017   Procedure: A/V FISTULAGRAM;  Surgeon: Conrad Colby, MD;  Location: [facility];  Service: Cardiovascular;  Laterality: Left;  lt arm  . APPENDECTOMY    . AV FISTULA PLACEMENT  12/05/2011   Procedure: ARTERIOVENOUS (AV) FISTULA CREATION;LLEFT ARM  Surgeon: Conrad Bellevue, MD;  Location: [facility];  Service: Vascular;  Laterality: Left;  RADIO-CEPHALIC  fistula left arm  . AV FISTULA PLACEMENT  01/11/2012   Procedure: ARTERIOVENOUS (AV) FISTULA CREATION;  Surgeon: Conrad Greenwich, MD;   Location: [facility];  Service: Vascular;  Laterality: Left;  Creation of left brachial cephalic arteriovenous fistula  . BASCILIC VEIN TRANSPOSITION Left 12/27/2016   Procedure: BASILIC VEIN TRANSPOSITION LEFT UPPER ARM FIRST STAGE;  Surgeon: Conrad [place] Shore, MD;  Location: [facility];  Service: Vascular;  Laterality: Left;  . BASCILIC VEIN TRANSPOSITION Left 01/31/2017   Procedure: LEFT ARM BASILIC VEIN TRANSPOSITION, SECOND STAGE;  Surgeon: Conrad [place] Mills, MD;  Location: [facility];  Service: Vascular;  Laterality: Left;  . CARDIAC CATHETERIZATION  04-05-2010   checking for blockage but none-[facility]  . COLONOSCOPY    . CYSTOSCOPY W/ STONE MANIPULATION     "laser once" (01/22/2013)  . HEMATOMA EVACUATION Left 05/09/2017   Procedure: EVACUATION HEMATOMA LEFT ARM;  Surgeon: Conrad Bayport, MD;  Location: [facility];  Service: Vascular;  Laterality: Left;  . HERNIA REPAIR    . INGUINAL HERNIA REPAIR Right 11/06/2015   Procedure: OPEN HERNIA REPAIR  RIGHT INGUINAL ADULT;  Surgeon: [name], MD;  Location: [facility];  Service: General;  Laterality: Right;  with MESH  . INSERTION OF DIALYSIS CATHETER Right 10/05/2016   Procedure: INSERTION OF right internal jugular DIALYSIS CATHETER;  Surgeon: [name], MD;  Location: [facility];  Service: Vascular;  Laterality: Right;  . INSERTION OF MESH  03/20/2012   Procedure: INSERTION OF MESH;  UMB Surgeon: [name], MD;  Location: [facility];  Service: General;  Laterality: N/A;  . INSERTION OF MESH N/A 01/22/2013   Procedure: INSERTION OF MESH;  Surgeon: [name], MD;  Location: [facility];  Service: General;  Laterality: N/A;  . LAPAROTOMY  04/02/2012   Procedure: EXPLORATORY LAPAROTOMY;  Surgeon: [name], MD;  Location: [facility];  Service: General;  Laterality: N/A;  Exploratory Laparotomy with resection of small intestine  . LEFT HEART CATHETERIZATION WITH CORONARY ANGIOGRAM N/A 05/14/2013   Procedure: LEFT HEART CATHETERIZATION WITH CORONARY ANGIOGRAM;  Surgeon: Sinclair Grooms, MD;  [facility]: [facility];  Service: Cardiovascular;  Laterality: N/A;  . LIGATION OF ARTERIOVENOUS  FISTULA Left 12/27/2016   Procedure: LIGATION/EXCISION OF LEFT UPPER ARM ARTERIOVENOUS  FISTULA;  EVACUATION OF HEMATOMA;  Surgeon: Conrad Pineland, MD;  Location: [facility];  Service: Vascular;  Laterality: Left;  . REVISON OF ARTERIOVENOUS FISTULA Left 10/05/2016   Procedure: REVISON OF left arm ARTERIOVENOUS FISTULA;  Surgeon: [name], MD;  Location: [facility];  Service: Vascular;  Laterality: Left;  . TONSILLECTOMY    . UMBILICAL HERNIA REPAIR  03/20/2012   Procedure: HERNIA REPAIR UMBILICAL ADULT;  Surgeon: [name], MD;  Location: [facility];  Service: General;  Laterality: N/A;  . UMBILICAL HERNIA REPAIR  01/22/2013   preperitoneal open procedure due to significant adhesions/notes 01/22/2013  . VENTRAL HERNIA REPAIR N/A 01/22/2013  Procedure: ATTEMPTED LAPAROSCOPIC VENTRAL HERNIA CONVERTED TO OPEN;  Surgeon: [name], MD;  [facility];  Service: General;  Laterality: N/A;    FAMILY HISTORY Family History  Problem Relation Age of Onset  . Heart disease Mother   . Hyperlipidemia Mother   . Hypertension Mother   . Kidney disease Father   . Stroke Father   . Kidney disease Brother   . Amblyopia Neg Hx   . Blindness Neg Hx   . Cataracts Neg Hx   . Diabetes Neg Hx   . Glaucoma Neg Hx   . Macular degeneration Neg Hx   . Retinal detachment Neg Hx   . Strabismus Neg Hx   . Retinitis pigmentosa Neg Hx     SOCIAL HISTORY Social History   Tobacco Use  . Smoking status: Never Smoker  . Smokeless tobacco: Never Used  Substance Use Topics  . Alcohol use: No    Alcohol/week: 0.0 oz  . Drug use: No         OPHTHALMIC EXAM:  Base Eye Exam    Visual Acuity (Snellen - Linear)      Right Left   Dist [place] 20/60 -1 20/30 -2   Dist ph [place] 20/40 20/25 -2       Tonometry (Tonopen, 8:42 AM)      Right Left   Pressure 22 21  Pt squeezing eyelids         Pupils      Dark Light Shape React APD   Right 4 3 Round Slow None   Left 4 3 Round Slow None       Visual Fields (Counting fingers)      Left Right    Full Full       Extraocular Movement      Right Left    Full, Ortho Full, Ortho       Neuro/Psych    Oriented x3:  Yes   Mood/Affect:  Normal       Dilation    Both eyes:  1.0% Mydriacyl, 2.5% Phenylephrine @ 8:42 AM        Slit Lamp and Fundus Exam    Slit Lamp Exam      Right Left   Lids/Lashes Dermatochalasis - upper lid Dermatochalasis - upper lid   Conjunctiva/Sclera Mild Melanosis Mild Melanosis   Cornea Arcus, scattered mild K haze - greatest inferiorly Arcus   Anterior Chamber Deep and quiet Deep and quiet   Iris Round and dilated Round and dilated   Lens 2+ Nuclear sclerosis, 2+ Cortical cataract 2+ Nuclear sclerosis, 2+ Cortical cataract   Vitreous Vitreous syneresis Vitreous syneresis       Fundus Exam      Right Left   Disc vascular loops with hemorrhage superiorly, +heme Normal   C/D Ratio 0.4 0.55   Macula Blunted foveal reflex, dark intraretinal hemorrhage SN to macula improved, scattered DBH superior macula - improving Flat, Good foveal reflex, No heme or edema   Vessels Vascular attenuation, dilated and Tortuous, Copper wiring superiorly Mild Copper wiring, mildly Tortuous   Periphery Attached, DBH superiorly and temporally Attached, pigmented lattice with 3 atrophic holes at 1030 - good laser surrounding          IMAGING AND PROCEDURES  Imaging and Procedures for 06/16/17  OCT, Retina - OU - Both Eyes     Right Eye Quality was good. Central Foveal Thickness: 257. Progression has been stable. Findings include normal foveal contour, outer retinal atrophy,  intraretinal fluid, no SRF.   Left Eye Quality was borderline. Central Foveal Thickness: 269. Progression has been stable. Findings include normal foveal contour, no IRF, no SRF, epiretinal membrane.   Notes *Images captured and stored on  drive  Diagnosis / Impression:  OD: focal area of retinal edema/IRF SN to fovea -- persistent retinal edema and IRF OS: trace ERM  Clinical management:  See below  Abbreviations: NFP - Normal foveal profile. CME - cystoid macular edema. PED - pigment epithelial detachment. IRF - intraretinal fluid. SRF - subretinal fluid. EZ - ellipsoid zone. ERM - epiretinal membrane. ORA - outer retinal atrophy. ORT - outer retinal tubulation. SRHM - subretinal hyper-reflective material         Intravitreal Injection, Pharmacologic Agent - OD - Right Eye     Time Out 06/16/2017. 9:30 AM. Confirmed correct patient, procedure, site, and patient consented.   Anesthesia Topical anesthesia was used. Anesthetic medications included Lidocaine 2%, Tetracaine 0.5%.   Procedure Preparation included 5% betadine to ocular surface, eyelid speculum. A supplied needle was used.   Injection: 1.25 mg Bevacizumab 1.25mg /0.29ml   NDC: 42706-237-62    Lot: [id number]     Expiration Date: 07/19/2017   Route: Intravitreal   Site: Right Eye   Waste: 0 mg  Post-op Post injection exam found visual acuity of at least counting fingers. The patient tolerated the procedure well. There were no complications. The patient received written and verbal post procedure care education.                 ASSESSMENT/PLAN:    ICD-10-CM   1. Branch retinal vein occlusion of right eye with macular edema H34.8310 Intravitreal Injection, Pharmacologic Agent - OD - Right Eye    Bevacizumab (AVASTIN) SOLN 1.25 mg  2. Retinal edema H35.81 OCT, Retina - OU - Both Eyes  3. Lattice degeneration of left retina H35.412   4. Atrophic retinal break, multiple, left eye H33.332   5. Combined forms of age-related cataract of both eyes H25.813   6. Branch retinal vein occlusion of left eye with macular edema H34.8320    1,2. BRVO w/ CME OD - s/p IVA OD #1 (12.21.18), #2 (01.18.19) - repeat OCT shows interval improvement in central  CME tracking from sup nasal macula and interval improvement in IRF - IVA OD #3 recommended today (02.14.19) - pt wishes to proceed - RBA of procedure discussed, questions answered - informed consent obtained and signed - see procedure note - F/U 4 weeks -- DFE/OCT/possible injection  3,4. Lattice degeneration with atrophic holes OS-  - peripheral holes superiorly at 1030 without SRF or RD - S/P laser retinopexy OS (01.04.19) - good laser in place  5. Combined forms age-related cataract OU-  - The symptoms of cataract, surgical options, and treatments and risks were discussed with patient. - discussed diagnosis and progression - not yet visually significant - monitor for now   Ophthalmic Meds Ordered this visit:  Meds ordered this encounter  Medications  . Bevacizumab (AVASTIN) SOLN 1.25 mg       Return in about 4 weeks (around 07/14/2017) for F/U BRVO w/ CME OD.  There are no Patient Instructions on file for this visit.   Explained the diagnoses, plan, and follow up with the patient and they expressed understanding.  Patient expressed understanding of the importance of proper follow up care.   This document serves as a record of services personally performed by 07/27/2017, MD, PhD. It was created on their  behalf by Catha Brow, [place], a certified ophthalmic assistant. The creation of this record is the provider's dictation and/or activities during the visit.  Electronically signed by: Catha Brow, [place]  06/16/17 11:24 AM   Gardiner Sleeper, M.D., Ph.D. Diseases & Surgery of the Retina and [place] 06/16/17   I have reviewed the above documentation for accuracy and completeness, and I agree with the above. Gardiner Sleeper, M.D., Ph.D. 06/16/17 11:26 AM     Abbreviations: M myopia (nearsighted); A astigmatism; H hyperopia (farsighted); P presbyopia; Mrx spectacle prescription;  CTL contact lenses; OD right eye; OS left eye;  OU both eyes  XT exotropia; ET esotropia; PEK punctate epithelial keratitis; PEE punctate epithelial erosions; DES dry eye syndrome; MGD meibomian gland dysfunction; ATs artificial tears; PFAT's preservative free artificial tears; [place] nuclear sclerotic cataract; PSC posterior subcapsular cataract; ERM epi-retinal membrane; PVD posterior vitreous detachment; RD retinal detachment; DM diabetes mellitus; DR diabetic retinopathy; NPDR non-proliferative diabetic retinopathy; PDR proliferative diabetic retinopathy; CSME clinically significant macular edema; DME diabetic macular edema; dbh dot blot hemorrhages; CWS cotton wool spot; POAG primary open angle glaucoma; C/D cup-to-disc ratio; HVF humphrey visual field; GVF goldmann visual field; OCT optical coherence tomography; IOP intraocular pressure; BRVO Branch retinal vein occlusion; CRVO central retinal vein occlusion; CRAO central retinal artery occlusion; BRAO branch retinal artery occlusion; RT retinal tear; SB scleral buckle; PPV pars plana vitrectomy; VH Vitreous hemorrhage; PRP panretinal laser photocoagulation; IVK intravitreal kenalog; VMT vitreomacular traction; MH Macular hole;  NVD neovascularization of the disc; NVE neovascularization elsewhere; AREDS age related eye disease study; ARMD age related macular degeneration; POAG primary open angle glaucoma; EBMD epithelial/anterior basement membrane dystrophy; ACIOL anterior chamber intraocular lens; IOL intraocular lens; PCIOL posterior chamber intraocular lens; Phaco/IOL phacoemulsification with intraocular lens placement; [place] photorefractive keratectomy; LASIK laser assisted in situ keratomileusis; HTN hypertension; DM diabetes mellitus; COPD chronic obstructive pulmonary disease

## 2017-06-16 ENCOUNTER — Ambulatory Visit (INDEPENDENT_AMBULATORY_CARE_PROVIDER_SITE_OTHER): Payer: Medicare Other | Admitting: Ophthalmology

## 2017-06-16 ENCOUNTER — Encounter (INDEPENDENT_AMBULATORY_CARE_PROVIDER_SITE_OTHER): Payer: Self-pay | Admitting: Ophthalmology

## 2017-06-16 DIAGNOSIS — H35412 Lattice degeneration of retina, left eye: Secondary | ICD-10-CM

## 2017-06-16 DIAGNOSIS — H25813 Combined forms of age-related cataract, bilateral: Secondary | ICD-10-CM

## 2017-06-16 DIAGNOSIS — H3581 Retinal edema: Secondary | ICD-10-CM | POA: Diagnosis not present

## 2017-06-16 DIAGNOSIS — H34831 Tributary (branch) retinal vein occlusion, right eye, with macular edema: Secondary | ICD-10-CM | POA: Diagnosis not present

## 2017-06-16 DIAGNOSIS — H34832 Tributary (branch) retinal vein occlusion, left eye, with macular edema: Secondary | ICD-10-CM

## 2017-06-16 DIAGNOSIS — H33332 Multiple defects of retina without detachment, left eye: Secondary | ICD-10-CM

## 2017-06-16 MED ORDER — BEVACIZUMAB CHEMO INJECTION 1.25MG/0.05ML SYRINGE FOR KALEIDOSCOPE
1.2500 mg | INTRAVITREAL | Status: DC
  Administered 2017-06-16: 1.25 mg via INTRAVITREAL

## 2017-06-17 DIAGNOSIS — N186 End stage renal disease: Secondary | ICD-10-CM | POA: Diagnosis not present

## 2017-06-17 DIAGNOSIS — N2581 Secondary hyperparathyroidism of renal origin: Secondary | ICD-10-CM | POA: Diagnosis not present

## 2017-06-19 DIAGNOSIS — N2581 Secondary hyperparathyroidism of renal origin: Secondary | ICD-10-CM | POA: Diagnosis not present

## 2017-06-19 DIAGNOSIS — N186 End stage renal disease: Secondary | ICD-10-CM | POA: Diagnosis not present

## 2017-06-21 DIAGNOSIS — N2581 Secondary hyperparathyroidism of renal origin: Secondary | ICD-10-CM | POA: Diagnosis not present

## 2017-06-21 DIAGNOSIS — N186 End stage renal disease: Secondary | ICD-10-CM | POA: Diagnosis not present

## 2017-06-23 DIAGNOSIS — N2581 Secondary hyperparathyroidism of renal origin: Secondary | ICD-10-CM | POA: Diagnosis not present

## 2017-06-23 DIAGNOSIS — N186 End stage renal disease: Secondary | ICD-10-CM | POA: Diagnosis not present

## 2017-06-26 DIAGNOSIS — N2581 Secondary hyperparathyroidism of renal origin: Secondary | ICD-10-CM | POA: Diagnosis not present

## 2017-06-26 DIAGNOSIS — N186 End stage renal disease: Secondary | ICD-10-CM | POA: Diagnosis not present

## 2017-06-28 DIAGNOSIS — N2581 Secondary hyperparathyroidism of renal origin: Secondary | ICD-10-CM | POA: Diagnosis not present

## 2017-06-28 DIAGNOSIS — N186 End stage renal disease: Secondary | ICD-10-CM | POA: Diagnosis not present

## 2017-06-30 DIAGNOSIS — Q612 Polycystic kidney, adult type: Secondary | ICD-10-CM | POA: Diagnosis not present

## 2017-06-30 DIAGNOSIS — N186 End stage renal disease: Secondary | ICD-10-CM | POA: Diagnosis not present

## 2017-06-30 DIAGNOSIS — N2581 Secondary hyperparathyroidism of renal origin: Secondary | ICD-10-CM | POA: Diagnosis not present

## 2017-06-30 DIAGNOSIS — Z992 Dependence on renal dialysis: Secondary | ICD-10-CM | POA: Diagnosis not present

## 2017-07-03 DIAGNOSIS — N2581 Secondary hyperparathyroidism of renal origin: Secondary | ICD-10-CM | POA: Diagnosis not present

## 2017-07-03 DIAGNOSIS — N186 End stage renal disease: Secondary | ICD-10-CM | POA: Diagnosis not present

## 2017-07-05 DIAGNOSIS — N2581 Secondary hyperparathyroidism of renal origin: Secondary | ICD-10-CM | POA: Diagnosis not present

## 2017-07-05 DIAGNOSIS — N186 End stage renal disease: Secondary | ICD-10-CM | POA: Diagnosis not present

## 2017-07-07 DIAGNOSIS — N186 End stage renal disease: Secondary | ICD-10-CM | POA: Diagnosis not present

## 2017-07-07 DIAGNOSIS — N2581 Secondary hyperparathyroidism of renal origin: Secondary | ICD-10-CM | POA: Diagnosis not present

## 2017-07-10 DIAGNOSIS — N2581 Secondary hyperparathyroidism of renal origin: Secondary | ICD-10-CM | POA: Diagnosis not present

## 2017-07-10 DIAGNOSIS — N186 End stage renal disease: Secondary | ICD-10-CM | POA: Diagnosis not present

## 2017-07-12 DIAGNOSIS — N186 End stage renal disease: Secondary | ICD-10-CM | POA: Diagnosis not present

## 2017-07-12 DIAGNOSIS — N2581 Secondary hyperparathyroidism of renal origin: Secondary | ICD-10-CM | POA: Diagnosis not present

## 2017-07-14 DIAGNOSIS — N186 End stage renal disease: Secondary | ICD-10-CM | POA: Diagnosis not present

## 2017-07-14 DIAGNOSIS — N2581 Secondary hyperparathyroidism of renal origin: Secondary | ICD-10-CM | POA: Diagnosis not present

## 2017-07-14 NOTE — Progress Notes (Signed)
[facility] Note  07/17/2017     CHIEF COMPLAINT Patient presents for Retina Follow Up   HISTORY OF PRESENT ILLNESS: Michael [name] is a 62 y.o. male who presents to the clinic today for:   HPI    Retina Follow Up    Patient presents with  CRVO/BRVO.  In right eye.  This started 3 months ago.  Severity is mild.  Since onset it is gradually worsening.  I, the attending physician,  performed the HPI with the patient and updated documentation appropriately.          Comments    F/U BRVO w/CME OD. Patient states about a week ago he noticed redness of his right eye  with occasional "sharpe shooting  pains",his eye has become more sensitive to light. Pt feels his vision has gotten worse since last ov.   Pt is ready for tx today.        Last edited by [name], MD on 07/17/2017  3:29 PM. (History)     Pt states OD is very red, states OD is painful off and on; Pt reports OD is tearing excessively;   Referring physician: [name], MD [place], [name] [address]  HISTORICAL INFORMATION:   Selected notes from the MEDICAL RECORD NUMBER Referred by Dr. [name]. Hecker for concern of HRVO OD; LEE- 11.30.18 ([name]) {BCVA OD: 20/100-1 OS: 20/20-2] Ocular Hx- cataract OU PMH- HTN, high chol, kidney disease, sleep apnea, emphysema    CURRENT MEDICATIONS: Current Outpatient Medications (Ophthalmic Drugs)  Medication Sig  . prednisoLONE acetate (PRED FORTE) 1 % ophthalmic suspension Place 1 drop into the right eye every hour while awake.   No current facility-administered medications for this visit.  (Ophthalmic Drugs)   Current Outpatient Medications (Other)  Medication Sig  . aspirin EC 81 MG EC tablet Take 1 tablet (81 mg total) by mouth daily.  [name] atorvastatin (LIPITOR) 40 MG tablet Take 40 mg by mouth every evening.   . calcium acetate (PHOSLO) 667 MG capsule Take 1,334 mg by mouth 3 (three) times daily with meals.   .  metoprolol succinate (TOPROL-XL) 100 MG 24 hr tablet Take 50 mg by mouth See admin instructions. Take 50 mg by mouth daily on Tuesday, Thursday and Saturday.  . nitroGLYCERIN (NITROSTAT) 0.4 MG SL tablet Place 1 tablet (0.4 mg total) under the tongue every 5 (five) minutes x 3 doses as needed for chest pain.  [name] oxyCODONE-acetaminophen (ROXICET) 5-325 MG tablet Take 1 tablet by mouth every 6 (six) hours as needed.  . SENSIPAR 60 MG tablet Take 60 mg by mouth every evening.    Current Facility-Administered Medications (Other)  Medication Route  . Bevacizumab (AVASTIN) SOLN 1.25 mg Intravitreal  . Bevacizumab (AVASTIN) SOLN 1.25 mg Intravitreal  . Bevacizumab (AVASTIN) SOLN 1.25 mg Intravitreal      REVIEW OF SYSTEMS: ROS    Positive for: Eyes   Negative for: Constitutional, Gastrointestinal, Neurological, Skin, Genitourinary, Musculoskeletal, HENT, Endocrine, Cardiovascular, Respiratory, Psychiatric, Allergic/Imm, Heme/Lymph   Last edited by Zenovia Jordan, LPN on 7/65/4650  3:54 PM. (History)       ALLERGIES No Known Allergies  PAST MEDICAL HISTORY Past Medical History:  Diagnosis Date  . Dyspnea    on exertion  . ESRD (end stage renal disease) ([name])    Hemo- MWF, Polycystic kidney disease  . Fatigue   . History of kidney stones    removal of stone- cysto  .  Hyperlipidemia   . Hyperparathyroidism, secondary renal ([name])   . Hypertension   . Hypoxemia 12/12/2013  . Nonischemic cardiomyopathy ([place])    Er 25% 2015, 55 % 2013  . OSA on CPAP    no longer using cpap  . OSA on CPAP 03/24/2014  . Pneumonia    2015ish  . Ventricular tachycardia//Freq PVCs    Past Surgical History:  Procedure Laterality Date  . A/V FISTULAGRAM Left 04/27/2017   Procedure: A/V FISTULAGRAM;  Surgeon: [name], MD;  Location: [facility];  Service: Cardiovascular;  Laterality: Left;  lt arm  . APPENDECTOMY    . AV FISTULA PLACEMENT  12/05/2011   Procedure: ARTERIOVENOUS (AV) FISTULA  CREATION;LLEFT ARM  Surgeon: [name], MD;  Location: [facility];  Service: Vascular;  Laterality: Left;  RADIO-CEPHALIC  fistula left arm  . AV FISTULA PLACEMENT  01/11/2012   Procedure: ARTERIOVENOUS (AV) FISTULA CREATION;  Surgeon: [name], MD;  Location: [facility];  Service: Vascular;  Laterality: Left;  Creation of left brachial cephalic arteriovenous fistula  . BASCILIC VEIN TRANSPOSITION Left 12/27/2016   Procedure: BASILIC VEIN TRANSPOSITION LEFT UPPER ARM FIRST STAGE;  Surgeon: [name], MD;  Location: [facility];  Service: Vascular;  Laterality: Left;  . BASCILIC VEIN TRANSPOSITION Left 01/31/2017   Procedure: LEFT ARM BASILIC VEIN TRANSPOSITION, SECOND STAGE;  Surgeon: [name], MD;  Location: [facility];  Service: Vascular;  Laterality: Left;  . CARDIAC CATHETERIZATION  04-05-2010   checking for blockage but none-[facility]  . COLONOSCOPY    . CYSTOSCOPY W/ STONE MANIPULATION     "laser once" (01/22/2013)  . HEMATOMA EVACUATION Left 05/09/2017   Procedure: EVACUATION HEMATOMA LEFT ARM;  Surgeon: [name], MD;  Location: [facility];  Service: Vascular;  Laterality: Left;  . HERNIA REPAIR    . INGUINAL HERNIA REPAIR Right 11/06/2015   Procedure: OPEN HERNIA REPAIR  RIGHT INGUINAL ADULT;  Surgeon: [name], MD;  Location: [facility];  Service: General;  Laterality: Right;  with MESH  . INSERTION OF DIALYSIS CATHETER Right 10/05/2016   Procedure: INSERTION OF right internal jugular DIALYSIS CATHETER;  Surgeon: [name], MD;  Location: [facility];  Service: Vascular;  Laterality: Right;  . INSERTION OF MESH  03/20/2012   Procedure: INSERTION OF MESH;  UMB Surgeon: [name], MD;  Location: [facility];  Service: General;  Laterality: N/A;  . INSERTION OF MESH N/A 01/22/2013   Procedure: INSERTION OF MESH;  Surgeon: [name], MD;  Location: [facility];  Service: General;  Laterality: N/A;  . LAPAROTOMY  04/02/2012   Procedure: EXPLORATORY LAPAROTOMY;  Surgeon: [name], MD;  Location: [facility];  Service: General;  Laterality: N/A;  Exploratory Laparotomy with resection of small intestine  . LEFT HEART CATHETERIZATION WITH CORONARY ANGIOGRAM N/A 05/14/2013   Procedure: LEFT HEART CATHETERIZATION WITH CORONARY ANGIOGRAM;  Surgeon: Sinclair Grooms, MD;  Location: [facility];  Service: Cardiovascular;  Laterality: N/A;  . LIGATION OF ARTERIOVENOUS  FISTULA Left 12/27/2016   Procedure: LIGATION/EXCISION OF LEFT UPPER ARM ARTERIOVENOUS  FISTULA;  EVACUATION OF HEMATOMA;  Surgeon: Conrad Stoddard, MD;  Location: [facility];  Service: Vascular;  Laterality: Left;  . REVISON OF ARTERIOVENOUS FISTULA Left 10/05/2016   Procedure: REVISON OF left arm ARTERIOVENOUS FISTULA;  Surgeon: [name], MD;  Location: [facility];  Service: Vascular;  Laterality: Left;  . TONSILLECTOMY    . UMBILICAL HERNIA REPAIR  03/20/2012  Procedure: HERNIA REPAIR UMBILICAL ADULT;  Surgeon: [name], MD;  Location: [facility];  Service: General;  Laterality: N/A;  . UMBILICAL HERNIA REPAIR  01/22/2013   preperitoneal open procedure due to significant adhesions/notes 01/22/2013  . VENTRAL HERNIA REPAIR N/A 01/22/2013   Procedure: ATTEMPTED LAPAROSCOPIC VENTRAL HERNIA CONVERTED TO OPEN;  Surgeon: [name], MD;  [facility];  Service: General;  Laterality: N/A;    FAMILY HISTORY Family History  Problem Relation Age of Onset  . Heart disease Mother   . Hyperlipidemia Mother   . Hypertension Mother   . Kidney disease Father   . Stroke Father   . Kidney disease Brother   . Amblyopia Neg Hx   . Blindness Neg Hx   . Cataracts Neg Hx   . Diabetes Neg Hx   . Glaucoma Neg Hx   . Macular degeneration Neg Hx   . Retinal detachment Neg Hx   . Strabismus Neg Hx   . Retinitis pigmentosa Neg Hx     SOCIAL HISTORY Social History   Tobacco Use  . Smoking status: Never Smoker  . Smokeless tobacco: Never Used  Substance Use Topics  . Alcohol use: No    Alcohol/week: 0.0 oz  . Drug use: No          OPHTHALMIC EXAM:  Base Eye Exam    Visual Acuity (Snellen - Linear)      Right Left   Dist [place] 20/150-1 20/50 +2   Dist ph [place] 20/70 20/20       Tonometry (Tonopen, 2:09 PM)      Right Left   Pressure 25 28       Tonometry #2 (Tonopen, 2:09 PM)      Right Left   Pressure 25 23       Pupils      Dark Light Shape React APD   Right 3 2 Round Slow None   Left 3 2 Round Slow None       Visual Fields (Counting fingers)      Left Right    Full Full       Extraocular Movement      Right Left    Full, Ortho Full, Ortho       Neuro/Psych    Oriented x3:  Yes   Mood/Affect:  Normal        Slit Lamp and Fundus Exam    Slit Lamp Exam      Right Left   Lids/Lashes Dermatochalasis - upper lid Dermatochalasis - upper lid   Conjunctiva/Sclera Mild Melanosis, 2-3+ Injection Mild Melanosis   Cornea Arcus, scattered mild K haze - greatest inferiorly, stelate KP inferiorly, 1-2+ diffuse microcystic edema Arcus   Anterior Chamber 1+ Cell Deep and quiet   Iris Round and dilated Round and dilated   Lens 2+ Nuclear sclerosis, 2+ Cortical cataract 2+ Nuclear sclerosis, 2+ Cortical cataract   Vitreous Vitreous syneresis, no cell Vitreous syneresis       Fundus Exam      Right Left   Disc vascular loops with hemorrhage superiorly, +heme Normal   C/D Ratio 0.5 0.55   Macula Blunted foveal reflex, persistent dark intraretinal hemorrhage SN to macula, scattered DBH superior macula - improving Flat, Good foveal reflex, No heme or edema   Vessels Vascular attenuation, dilated and Tortuous, Copper wiring superiorly Mild Copper wiring, mildly Tortuous   Periphery Attached, DBH superiorly and temporally Attached, pigmented lattice with 3 atrophic holes at 1030 ora - good  laser surrounding          IMAGING AND PROCEDURES  Imaging and Procedures for 07/17/17  OCT, Retina - OU - Both Eyes     Right Eye Quality was good. Central Foveal Thickness: 264. Progression has been stable.  Findings include normal foveal contour, outer retinal atrophy, intraretinal fluid, no SRF (Trace cystic changes).   Left Eye Quality was borderline. Central Foveal Thickness: 269. Progression has been stable. Findings include normal foveal contour, no IRF, no SRF, epiretinal membrane.   Notes *Images captured and stored on drive  Diagnosis / Impression:  OD: focal area of retinal edema/IRF SN to fovea -- persistent retinal edema and IRF OS: trace ERM  Clinical management:  See below  Abbreviations: NFP - Normal foveal profile. CME - cystoid macular edema. PED - pigment epithelial detachment. IRF - intraretinal fluid. SRF - subretinal fluid. EZ - ellipsoid zone. ERM - epiretinal membrane. ORA - outer retinal atrophy. ORT - outer retinal tubulation. SRHM - subretinal hyper-reflective material         Fluorescein Angiography Optos (Transit OD)     Right Eye Progression has no prior data. Early phase findings include microaneurysm. Mid/Late phase findings include microaneurysm.   Left Eye Progression has no prior data. Early phase findings include normal observations. Mid/Late phase findings include normal observations.   Notes Impression:   OD: hazy view but no obvious leakage or vasculitis; few microaneurysms OS: normal study                ASSESSMENT/PLAN:    ICD-10-CM   1. Acute anterior uveitis of right eye H20.9 Fluorescein Angiography Optos (Transit OD)  2. Branch retinal vein occlusion of right eye with macular edema H34.8310 Fluorescein Angiography Optos (Transit OD)  3. Retinal edema H35.81 OCT, Retina - OU - Both Eyes    Fluorescein Angiography Optos (Transit OD)  4. Lattice degeneration of left retina H35.412   5. Atrophic retinal break, multiple, left eye H33.332   6. Combined forms of age-related cataract of both eyes H25.813    1. Acute Anterior Uveitis OD-  - pt reports eye pain and redness OD ~1wk - exam shows significant injection, KP, AC cell, no  posterior involvement - FA without posterior involvement, vasculitis or leakage OD - by history, this is a first episode, no prior episodes - discussed findings and prognosis -- since first episode, will defer work up for now - start PF OD q1hr while awake, cyclopentolate OD BID - F/U Wednesday  2, 3. BRVO w/ CME OD - s/p IVA OD #1 (12.21.18), #2 (01.18.19), #3 (02.14.19) - repeat OCT shows interval improvement in central CME tracking from sup nasal macula and interval improvement in IRF - due for IVA #4 today but will defer due to active uveitis as described above - F/U 4 weeks -- DFE/OCT/possible injection  4, 5. Lattice degeneration with atrophic holes OS-  - peripheral holes superiorly at 1030 without SRF or RD - S/P laser retinopexy OS (01.04.19) - good laser in place  6. Combined forms age-related cataract OU-  - The symptoms of cataract, surgical options, and treatments and risks were discussed with patient. - discussed diagnosis and progression - not yet visually significant - monitor for now   Ophthalmic Meds Ordered this visit:  Meds ordered this encounter  Medications  . prednisoLONE acetate (PRED FORTE) 1 % ophthalmic suspension    Sig: Place 1 drop into the right eye every hour while awake.    Dispense:  15 mL    Refill:  0       Return in about 2 days (around 07/19/2017) for F/U Uveitis OD.  There are no Patient Instructions on file for this visit.   Explained the diagnoses, plan, and follow up with the patient and they expressed understanding.  Patient expressed understanding of the importance of proper follow up care.   This document serves as a record of services personally performed by Gardiner Sleeper, MD, PhD. It was created on their behalf by Catha Brow, [place], a certified ophthalmic assistant. The creation of this record is the provider's dictation and/or activities during the visit.  Electronically signed by: Catha Brow, [place]  07/17/17 3:47  PM   Gardiner Sleeper, M.D., Ph.D. Diseases & Surgery of the Retina and [place] 07/17/17   I have reviewed the above documentation for accuracy and completeness, and I agree with the above. Gardiner Sleeper, M.D., Ph.D. 07/17/17 3:47 PM     Abbreviations: M myopia (nearsighted); A astigmatism; H hyperopia (farsighted); P presbyopia; Mrx spectacle prescription;  CTL contact lenses; OD right eye; OS left eye; OU both eyes  XT exotropia; ET esotropia; PEK punctate epithelial keratitis; PEE punctate epithelial erosions; DES dry eye syndrome; MGD meibomian gland dysfunction; ATs artificial tears; PFAT's preservative free artificial tears; [place] nuclear sclerotic cataract; PSC posterior subcapsular cataract; ERM epi-retinal membrane; PVD posterior vitreous detachment; RD retinal detachment; DM diabetes mellitus; DR diabetic retinopathy; NPDR non-proliferative diabetic retinopathy; PDR proliferative diabetic retinopathy; CSME clinically significant macular edema; DME diabetic macular edema; dbh dot blot hemorrhages; CWS cotton wool spot; POAG primary open angle glaucoma; C/D cup-to-disc ratio; HVF humphrey visual field; GVF goldmann visual field; OCT optical coherence tomography; IOP intraocular pressure; BRVO Branch retinal vein occlusion; CRVO central retinal vein occlusion; CRAO central retinal artery occlusion; BRAO branch retinal artery occlusion; RT retinal tear; SB scleral buckle; PPV pars plana vitrectomy; VH Vitreous hemorrhage; PRP panretinal laser photocoagulation; IVK intravitreal kenalog; VMT vitreomacular traction; MH Macular hole;  NVD neovascularization of the disc; NVE neovascularization elsewhere; AREDS age related eye disease study; ARMD age related macular degeneration; POAG primary open angle glaucoma; EBMD epithelial/anterior basement membrane dystrophy; ACIOL anterior chamber intraocular lens; IOL intraocular lens; PCIOL posterior chamber intraocular  lens; Phaco/IOL phacoemulsification with intraocular lens placement; [place] photorefractive keratectomy; LASIK laser assisted in situ keratomileusis; HTN hypertension; DM diabetes mellitus; COPD chronic obstructive pulmonary disease

## 2017-07-17 ENCOUNTER — Ambulatory Visit (INDEPENDENT_AMBULATORY_CARE_PROVIDER_SITE_OTHER): Payer: Medicare Other | Admitting: Ophthalmology

## 2017-07-17 ENCOUNTER — Encounter (INDEPENDENT_AMBULATORY_CARE_PROVIDER_SITE_OTHER): Payer: Self-pay | Admitting: Ophthalmology

## 2017-07-17 DIAGNOSIS — H3581 Retinal edema: Secondary | ICD-10-CM

## 2017-07-17 DIAGNOSIS — H2000 Unspecified acute and subacute iridocyclitis: Secondary | ICD-10-CM

## 2017-07-17 DIAGNOSIS — N2581 Secondary hyperparathyroidism of renal origin: Secondary | ICD-10-CM | POA: Diagnosis not present

## 2017-07-17 DIAGNOSIS — H25813 Combined forms of age-related cataract, bilateral: Secondary | ICD-10-CM | POA: Diagnosis not present

## 2017-07-17 DIAGNOSIS — H34831 Tributary (branch) retinal vein occlusion, right eye, with macular edema: Secondary | ICD-10-CM | POA: Diagnosis not present

## 2017-07-17 DIAGNOSIS — H35412 Lattice degeneration of retina, left eye: Secondary | ICD-10-CM

## 2017-07-17 DIAGNOSIS — H33332 Multiple defects of retina without detachment, left eye: Secondary | ICD-10-CM

## 2017-07-17 DIAGNOSIS — H209 Unspecified iridocyclitis: Secondary | ICD-10-CM | POA: Diagnosis not present

## 2017-07-17 DIAGNOSIS — N186 End stage renal disease: Secondary | ICD-10-CM | POA: Diagnosis not present

## 2017-07-17 MED ORDER — PREDNISOLONE ACETATE 1 % OP SUSP: 1.0000 [drp] | OPHTHALMIC | 0 refills | Status: DC

## 2017-07-18 NOTE — Progress Notes (Signed)
[facility] Note  07/19/2017     CHIEF COMPLAINT Patient presents for Retina Follow Up   HISTORY OF PRESENT ILLNESS: Michael [name] is a 62 y.o. male who presents to the clinic today for:   HPI    Retina Follow Up    Patient presents with  Other.  In right eye.  This started 1 week ago.  Severity is mild.  Since onset it is gradually improving.  I, the attending physician,  performed the HPI with the patient and updated documentation appropriately.          Comments    F/U Uveitis of Od. Patient states "after last ov the sharpe pain went away, vision is becoming more clearer each day".Redeness of right eye is gradually clearing. Denies wavy vision,flashes and distortion. Pt is in compliance with eye gtt's as instructed.         Last edited by [name], MD on 07/20/2017 12:04 AM. (History)     Pt states OD is very red, states OD is painful off and on; Pt reports OD is tearing excessively;   Referring physician: [name], MD [place],  [id number]  HISTORICAL INFORMATION:   Selected notes from the MEDICAL RECORD NUMBER Referred by Dr. [name]. Hecker for concern of HRVO OD; LEE- 11.30.18 ([name]) {BCVA OD: 20/100-1 OS: 20/20-2] Ocular Hx- cataract OU PMH- HTN, high chol, kidney disease, sleep apnea, emphysema    CURRENT MEDICATIONS: Current Outpatient Medications (Ophthalmic Drugs)  Medication Sig  . prednisoLONE acetate (PRED FORTE) 1 % ophthalmic suspension Place 1 drop into the right eye every hour while awake.   No current facility-administered medications for this visit.  (Ophthalmic Drugs)   Current Outpatient Medications (Other)  Medication Sig  . aspirin EC 81 MG EC tablet Take 1 tablet (81 mg total) by mouth daily.  [name] atorvastatin (LIPITOR) 40 MG tablet Take 40 mg by mouth every evening.   . calcium acetate (PHOSLO) 667 MG capsule Take 1,334 mg by mouth 3 (three) times daily with meals.   .  metoprolol succinate (TOPROL-XL) 100 MG 24 hr tablet Take 50 mg by mouth See admin instructions. Take 50 mg by mouth daily on Tuesday, Thursday and Saturday.  . nitroGLYCERIN (NITROSTAT) 0.4 MG SL tablet Place 1 tablet (0.4 mg total) under the tongue every 5 (five) minutes x 3 doses as needed for chest pain.  [name] oxyCODONE-acetaminophen (ROXICET) 5-325 MG tablet Take 1 tablet by mouth every 6 (six) hours as needed.  . SENSIPAR 60 MG tablet Take 60 mg by mouth every evening.    Current Facility-Administered Medications (Other)  Medication Route  . Bevacizumab (AVASTIN) SOLN 1.25 mg Intravitreal  . Bevacizumab (AVASTIN) SOLN 1.25 mg Intravitreal  . Bevacizumab (AVASTIN) SOLN 1.25 mg Intravitreal      REVIEW OF SYSTEMS: ROS    Positive for: Genitourinary, Endocrine, Cardiovascular, Eyes   Negative for: Constitutional, Gastrointestinal, Neurological, Skin, Musculoskeletal, HENT, Respiratory, Psychiatric, Allergic/Imm, Heme/Lymph   Last edited by Zenovia Jordan, LPN on 1/79/1505  6:97 PM. (History)       ALLERGIES No Known Allergies  PAST MEDICAL HISTORY Past Medical History:  Diagnosis Date  . Dyspnea    on exertion  . ESRD (end stage renal disease) ([place])    Hemo- MWF, Polycystic kidney disease  . Fatigue   . History of kidney stones    removal of stone- cysto  . Hyperlipidemia   . Hyperparathyroidism, secondary renal (  [place])   . Hypertension   . Hypoxemia 12/12/2013  . Nonischemic cardiomyopathy ([place])    Er 25% 2015, 55 % 2013  . OSA on CPAP    no longer using cpap  . OSA on CPAP 03/24/2014  . Pneumonia    2015ish  . Ventricular tachycardia//Freq PVCs    Past Surgical History:  Procedure Laterality Date  . A/V FISTULAGRAM Left 04/27/2017   Procedure: A/V FISTULAGRAM;  Surgeon: Conrad [place], MD;  Location: [place] [facility];  Service: Cardiovascular;  Laterality: Left;  lt arm  . APPENDECTOMY    . AV FISTULA PLACEMENT  12/05/2011   Procedure: ARTERIOVENOUS (AV) FISTULA  CREATION;LLEFT ARM  Surgeon: Conrad Riley, MD;  Location: [place];  Service: Vascular;  Laterality: Left;  RADIO-CEPHALIC  fistula left arm  . AV FISTULA PLACEMENT  01/11/2012   Procedure: ARTERIOVENOUS (AV) FISTULA CREATION;  Surgeon: Conrad Morton, MD;  Location: [place];  Service: Vascular;  Laterality: Left;  Creation of left brachial cephalic arteriovenous fistula  . BASCILIC VEIN TRANSPOSITION Left 12/27/2016   Procedure: BASILIC VEIN TRANSPOSITION LEFT UPPER ARM FIRST STAGE;  Surgeon: Conrad [place], MD;  Location: [place];  Service: Vascular;  Laterality: Left;  . BASCILIC VEIN TRANSPOSITION Left 01/31/2017   Procedure: LEFT ARM BASILIC VEIN TRANSPOSITION, SECOND STAGE;  Surgeon: Conrad [place], MD;  Location: [place];  Service: Vascular;  Laterality: Left;  . CARDIAC CATHETERIZATION  04-05-2010   checking for blockage but none-[facility]  . COLONOSCOPY    . CYSTOSCOPY W/ STONE MANIPULATION     "laser once" (01/22/2013)  . HEMATOMA EVACUATION Left 05/09/2017   Procedure: EVACUATION HEMATOMA LEFT ARM;  Surgeon: Conrad [place], MD;  Location: [place];  Service: Vascular;  Laterality: Left;  . HERNIA REPAIR    . INGUINAL HERNIA REPAIR Right 11/06/2015   Procedure: OPEN HERNIA REPAIR  RIGHT INGUINAL ADULT;  Surgeon: [name], MD;  Location: [facility];  Service: General;  Laterality: Right;  with MESH  . INSERTION OF DIALYSIS CATHETER Right 10/05/2016   Procedure: INSERTION OF right internal jugular DIALYSIS CATHETER;  Surgeon: [name], MD;  Location: [place];  Service: Vascular;  Laterality: Right;  . INSERTION OF MESH  03/20/2012   Procedure: INSERTION OF MESH;  UMB Surgeon: [name], MD;  Location: [place];  Service: General;  Laterality: N/A;  . INSERTION OF MESH N/A 01/22/2013   Procedure: INSERTION OF MESH;  Surgeon: [name], MD;  Location: [place];  Service: General;  Laterality: N/A;  . LAPAROTOMY  04/02/2012   Procedure: EXPLORATORY LAPAROTOMY;  Surgeon: [name], MD;  Location: [place];  Service: General;  Laterality: N/A;  Exploratory Laparotomy with resection of small intestine  . LEFT HEART CATHETERIZATION WITH CORONARY ANGIOGRAM N/A 05/14/2013   Procedure: LEFT HEART CATHETERIZATION WITH CORONARY ANGIOGRAM;  Surgeon: Sinclair Grooms, MD;  Location: [facility];  Service: Cardiovascular;  Laterality: N/A;  . LIGATION OF ARTERIOVENOUS  FISTULA Left 12/27/2016   Procedure: LIGATION/EXCISION OF LEFT UPPER ARM ARTERIOVENOUS  FISTULA;  EVACUATION OF HEMATOMA;  Surgeon: Conrad Clarinda, MD;  Location: [place];  Service: Vascular;  Laterality: Left;  . REVISON OF ARTERIOVENOUS FISTULA Left 10/05/2016   Procedure: REVISON OF left arm ARTERIOVENOUS FISTULA;  Surgeon: [name], MD;  Location: [place];  Service: Vascular;  Laterality: Left;  . TONSILLECTOMY    . UMBILICAL HERNIA REPAIR  03/20/2012   Procedure: HERNIA REPAIR UMBILICAL ADULT;  Surgeon: [name], MD;  Location: [place];  Service: General;  Laterality: N/A;  . UMBILICAL HERNIA REPAIR  01/22/2013   preperitoneal open procedure due to significant adhesions/notes 01/22/2013  . VENTRAL HERNIA REPAIR N/A 01/22/2013   Procedure: ATTEMPTED LAPAROSCOPIC VENTRAL HERNIA CONVERTED TO OPEN;  Surgeon: [name], MD;  [facility];  Service: General;  Laterality: N/A;    FAMILY HISTORY Family History  Problem Relation Age of Onset  . Heart disease Mother   . Hyperlipidemia Mother   . Hypertension Mother   . Kidney disease Father   . Stroke Father   . Kidney disease Brother   . Amblyopia Neg Hx   . Blindness Neg Hx   . Cataracts Neg Hx   . Diabetes Neg Hx   . Glaucoma Neg Hx   . Macular degeneration Neg Hx   . Retinal detachment Neg Hx   . Strabismus Neg Hx   . Retinitis pigmentosa Neg Hx     SOCIAL HISTORY Social History   Tobacco Use  . Smoking status: Never Smoker  . Smokeless tobacco: Never Used  Substance Use Topics  . Alcohol use: No    Alcohol/week: 0.0 oz  . Drug use: No          OPHTHALMIC EXAM:  Base Eye Exam    Visual Acuity (Snellen - Linear)      Right Left   Dist [employer] 20/150 20/40   Dist ph [place] 20/70 20/20       Tonometry (Tonopen, 1:55 PM)      Right Left   Pressure 25 17       Tonometry #2      Right Left   Pressure 26        Pupils      Dark Light Shape React APD   Right 7 7 Round NR None   Left 3 2.5 Round Minimal None       Visual Fields (Counting fingers)      Left Right    Full Full       Extraocular Movement      Right Left    Full, Ortho Full, Ortho       Neuro/Psych    Oriented x3:  Yes   Mood/Affect:  Normal        Slit Lamp and Fundus Exam    Slit Lamp Exam      Right Left   Lids/Lashes Dermatochalasis - upper lid Dermatochalasis - upper lid   Conjunctiva/Sclera Mild Melanosis, 1-2+ Injection Mild Melanosis   Cornea Arcus, scattered mild K haze - greatest inferiorly, stellate KP inferiorly, 1-2+ diffuse microcystic edema - improving Arcus   Anterior Chamber 0.5+ Cell Deep and quiet   Iris Round and dilated Round and dilated   Lens 2+ Nuclear sclerosis, 2+ Cortical cataract 2+ Nuclear sclerosis, 2+ Cortical cataract   Vitreous Vitreous syneresis, no cell Vitreous syneresis       Fundus Exam      Right Left   Disc vascular loops with hemorrhage superiorly, +heme Normal   C/D Ratio 0.5 0.55   Macula Blunted foveal reflex, persistent dark intraretinal hemorrhage SN to macula, scattered DBH superior macula - improving Flat, Good foveal reflex, No heme or edema   Vessels Vascular attenuation, dilated and Tortuous, Copper wiring superiorly Mild Copper wiring, mildly Tortuous   Periphery Attached, DBH superiorly and temporally Attached, pigmented lattice with 3 atrophic holes at 1030 ora - good laser surrounding  IMAGING AND PROCEDURES  Imaging and Procedures for 07/20/17           ASSESSMENT/PLAN:    ICD-10-CM   1. Acute anterior uveitis of right eye H20.9   2. Branch retinal vein occlusion of  right eye with macular edema H34.8310   3. Retinal edema H35.81   4. Lattice degeneration of left retina H35.412   5. Atrophic retinal break, multiple, left eye H33.332   6. Combined forms of age-related cataract of both eyes H25.813   7. Branch retinal vein occlusion of left eye with macular edema H34.8320    1. Acute Anterior Uveitis OD-  - pt reports eye pain and redness OD ~1wk - FA on  without posterior involvement, vasculitis or leakage OD - by history, this is a first episode, no prior episodes - discussed findings and prognosis -- since first episode, will defer work up for now - subjective improvement in symptoms of eye pain and redness.  - objective improvement in conj injection and corneal edema - continue PF OD q1hr while awake, cyclopentolate OD BID - Increased IOP OD 25, today - start cosopt OD BID - F/U Friday  2, 3. BRVO w/ CME OD - s/p IVA OD #1 (12.21.18), #2 (01.18.19), #3 (02.14.19) - repeat OCT shows interval improvement in central CME tracking from sup nasal macula and interval improvement in IRF - due for IVA #4 today but will defer due to active uveitis as described above - F/U 4 weeks -- DFE/OCT/possible injection  4, 5. Lattice degeneration with atrophic holes OS-  - peripheral holes superiorly at 1030 without SRF or RD - S/P laser retinopexy OS (01.04.19) - good laser in place  6. Combined forms age-related cataract OU-  - The symptoms of cataract, surgical options, and treatments and risks were discussed with patient. - discussed diagnosis and progression - not yet visually significant - monitor for now   Ophthalmic Meds Ordered this visit:  No orders of the defined types were placed in this encounter.      Return in about 2 days (around 07/21/2017) for F/U Anterior Uveitis OD.  There are no Patient Instructions on file for this visit.   Explained the diagnoses, plan, and follow up with the patient and they expressed understanding.  Patient  expressed understanding of the importance of proper follow up care.   This document serves as a record of services personally performed by Gardiner Sleeper, MD, PhD. It was created on their behalf by Catha Brow, [place], a certified ophthalmic assistant. The creation of this record is the provider's dictation and/or activities during the visit.  Electronically signed by: Catha Brow, [place]  07/20/17 12:04 AM   Gardiner Sleeper, M.D., Ph.D. Diseases & Surgery of the Retina and [place] 07/20/17  I have reviewed the above documentation for accuracy and completeness, and I agree with the above. Gardiner Sleeper, M.D., Ph.D. 07/20/17 12:08 AM     Abbreviations: M myopia (nearsighted); A astigmatism; H hyperopia (farsighted); P presbyopia; Mrx spectacle prescription;  CTL contact lenses; OD right eye; OS left eye; OU both eyes  XT exotropia; ET esotropia; PEK punctate epithelial keratitis; PEE punctate epithelial erosions; DES dry eye syndrome; MGD meibomian gland dysfunction; ATs artificial tears; PFAT's preservative free artificial tears; [place] nuclear sclerotic cataract; PSC posterior subcapsular cataract; ERM epi-retinal membrane; PVD posterior vitreous detachment; RD retinal detachment; DM diabetes mellitus; DR diabetic retinopathy; NPDR non-proliferative diabetic retinopathy; PDR proliferative diabetic retinopathy; CSME clinically  significant macular edema; DME diabetic macular edema; dbh dot blot hemorrhages; CWS cotton wool spot; POAG primary open angle glaucoma; C/D cup-to-disc ratio; HVF humphrey visual field; GVF goldmann visual field; OCT optical coherence tomography; IOP intraocular pressure; BRVO Branch retinal vein occlusion; CRVO central retinal vein occlusion; CRAO central retinal artery occlusion; BRAO branch retinal artery occlusion; RT retinal tear; SB scleral buckle; PPV pars plana vitrectomy; VH Vitreous hemorrhage; PRP panretinal laser  photocoagulation; IVK intravitreal kenalog; VMT vitreomacular traction; MH Macular hole;  NVD neovascularization of the disc; NVE neovascularization elsewhere; AREDS age related eye disease study; ARMD age related macular degeneration; POAG primary open angle glaucoma; EBMD epithelial/anterior basement membrane dystrophy; ACIOL anterior chamber intraocular lens; IOL intraocular lens; PCIOL posterior chamber intraocular lens; Phaco/IOL phacoemulsification with intraocular lens placement; [place] photorefractive keratectomy; LASIK laser assisted in situ keratomileusis; HTN hypertension; DM diabetes mellitus; COPD chronic obstructive pulmonary disease

## 2017-07-19 ENCOUNTER — Encounter (INDEPENDENT_AMBULATORY_CARE_PROVIDER_SITE_OTHER): Payer: Self-pay | Admitting: Ophthalmology

## 2017-07-19 ENCOUNTER — Ambulatory Visit (INDEPENDENT_AMBULATORY_CARE_PROVIDER_SITE_OTHER): Payer: Medicare Other | Admitting: Ophthalmology

## 2017-07-19 DIAGNOSIS — N186 End stage renal disease: Secondary | ICD-10-CM | POA: Diagnosis not present

## 2017-07-19 DIAGNOSIS — H33332 Multiple defects of retina without detachment, left eye: Secondary | ICD-10-CM

## 2017-07-19 DIAGNOSIS — H209 Unspecified iridocyclitis: Secondary | ICD-10-CM | POA: Diagnosis not present

## 2017-07-19 DIAGNOSIS — H34832 Tributary (branch) retinal vein occlusion, left eye, with macular edema: Secondary | ICD-10-CM

## 2017-07-19 DIAGNOSIS — H35412 Lattice degeneration of retina, left eye: Secondary | ICD-10-CM

## 2017-07-19 DIAGNOSIS — H25813 Combined forms of age-related cataract, bilateral: Secondary | ICD-10-CM

## 2017-07-19 DIAGNOSIS — H34831 Tributary (branch) retinal vein occlusion, right eye, with macular edema: Secondary | ICD-10-CM

## 2017-07-19 DIAGNOSIS — H3581 Retinal edema: Secondary | ICD-10-CM | POA: Diagnosis not present

## 2017-07-19 DIAGNOSIS — H2000 Unspecified acute and subacute iridocyclitis: Secondary | ICD-10-CM

## 2017-07-19 DIAGNOSIS — N2581 Secondary hyperparathyroidism of renal origin: Secondary | ICD-10-CM | POA: Diagnosis not present

## 2017-07-20 ENCOUNTER — Encounter (INDEPENDENT_AMBULATORY_CARE_PROVIDER_SITE_OTHER): Payer: Self-pay | Admitting: Ophthalmology

## 2017-07-21 ENCOUNTER — Ambulatory Visit (INDEPENDENT_AMBULATORY_CARE_PROVIDER_SITE_OTHER): Payer: Medicare Other | Admitting: Ophthalmology

## 2017-07-21 ENCOUNTER — Encounter (INDEPENDENT_AMBULATORY_CARE_PROVIDER_SITE_OTHER): Payer: Self-pay | Admitting: Ophthalmology

## 2017-07-21 DIAGNOSIS — H34831 Tributary (branch) retinal vein occlusion, right eye, with macular edema: Secondary | ICD-10-CM | POA: Diagnosis not present

## 2017-07-21 DIAGNOSIS — H209 Unspecified iridocyclitis: Secondary | ICD-10-CM | POA: Diagnosis not present

## 2017-07-21 DIAGNOSIS — N186 End stage renal disease: Secondary | ICD-10-CM | POA: Diagnosis not present

## 2017-07-21 DIAGNOSIS — H33332 Multiple defects of retina without detachment, left eye: Secondary | ICD-10-CM

## 2017-07-21 DIAGNOSIS — H35412 Lattice degeneration of retina, left eye: Secondary | ICD-10-CM

## 2017-07-21 DIAGNOSIS — N2581 Secondary hyperparathyroidism of renal origin: Secondary | ICD-10-CM | POA: Diagnosis not present

## 2017-07-21 DIAGNOSIS — H2000 Unspecified acute and subacute iridocyclitis: Secondary | ICD-10-CM

## 2017-07-21 DIAGNOSIS — H3581 Retinal edema: Secondary | ICD-10-CM

## 2017-07-21 DIAGNOSIS — H25813 Combined forms of age-related cataract, bilateral: Secondary | ICD-10-CM | POA: Diagnosis not present

## 2017-07-21 NOTE — Progress Notes (Signed)
[facility] Note  07/21/2017     CHIEF COMPLAINT Patient presents for Retina Follow Up   HISTORY OF PRESENT ILLNESS: Michael [name] is a 62 y.o. male who presents to the clinic today for:   HPI    Retina Follow Up    Patient presents with  Other.  In right eye.  This started 3 days ago.  Severity is mild.  Duration of 3 days.  Since onset it is gradually improving.  I, the attending physician,  performed the HPI with the patient and updated documentation appropriately.          Comments    62 y/o male pt here for f/u for anterior uveitis OD.  Vision OD is greatly improved.  No change in vision OS.  Denies pain, flashes, floaters.  Using a gtt bid to keep OD dilated (pt does not recall the name).  Pt is using a gtt bid OD for pressure (does not recall the name).  Pred q1hr OD.       Last edited by [name], MD on 07/26/2017 11:08 PM. (History)     Referring physician: [name], MD [place], [place] [address]  HISTORICAL INFORMATION:   Selected notes from the MEDICAL RECORD NUMBER Referred by Dr. [name]. Hecker for concern of HRVO OD; LEE- 11.30.18 ([name]) {BCVA OD: 20/100-1 OS: 20/20-2] Ocular Hx- cataract OU PMH- HTN, high chol, kidney disease, sleep apnea, emphysema    CURRENT MEDICATIONS: Current Outpatient Medications (Ophthalmic Drugs)  Medication Sig  . prednisoLONE acetate (PRED FORTE) 1 % ophthalmic suspension Place 1 drop into the right eye every hour while awake.   No current facility-administered medications for this visit.  (Ophthalmic Drugs)   Current Outpatient Medications (Other)  Medication Sig  . aspirin EC 81 MG EC tablet Take 1 tablet (81 mg total) by mouth daily.  [name] atorvastatin (LIPITOR) 40 MG tablet Take 40 mg by mouth every evening.   . calcium acetate (PHOSLO) 667 MG capsule Take 1,334 mg by mouth 3 (three) times daily with meals.   . metoprolol succinate (TOPROL-XL) 100 MG 24 hr  tablet Take 50 mg by mouth See admin instructions. Take 50 mg by mouth daily on Tuesday, Thursday and Saturday.  . nitroGLYCERIN (NITROSTAT) 0.4 MG SL tablet Place 1 tablet (0.4 mg total) under the tongue every 5 (five) minutes x 3 doses as needed for chest pain.  [name] oxyCODONE-acetaminophen (ROXICET) 5-325 MG tablet Take 1 tablet by mouth every 6 (six) hours as needed.  . SENSIPAR 60 MG tablet Take 60 mg by mouth every evening.    Current Facility-Administered Medications (Other)  Medication Route  . Bevacizumab (AVASTIN) SOLN 1.25 mg Intravitreal  . Bevacizumab (AVASTIN) SOLN 1.25 mg Intravitreal  . Bevacizumab (AVASTIN) SOLN 1.25 mg Intravitreal      REVIEW OF SYSTEMS: ROS    Positive for: Eyes   Negative for: Constitutional, Gastrointestinal, Neurological, Skin, Genitourinary, Musculoskeletal, HENT, Endocrine, Cardiovascular, Respiratory, Psychiatric, Allergic/Imm, Heme/Lymph   Last edited by Matthew Folks, COA on 07/21/2017 11:13 AM. (History)       ALLERGIES No Known Allergies  PAST MEDICAL HISTORY Past Medical History:  Diagnosis Date  . Dyspnea    on exertion  . ESRD (end stage renal disease) ([place])    Hemo- MWF, Polycystic kidney disease  . Fatigue   . History of kidney stones    removal of stone- cysto  . Hyperlipidemia   .  Hyperparathyroidism, secondary renal ([place])   . Hypertension   . Hypoxemia 12/12/2013  . Nonischemic cardiomyopathy ([place])    Er 25% 2015, 55 % 2013  . OSA on CPAP    no longer using cpap  . OSA on CPAP 03/24/2014  . Pneumonia    2015ish  . Ventricular tachycardia//Freq PVCs    Past Surgical History:  Procedure Laterality Date  . A/V FISTULAGRAM Left 04/27/2017   Procedure: A/V FISTULAGRAM;  Surgeon: Conrad Beaver, MD;  Location: [facility];  Service: Cardiovascular;  Laterality: Left;  lt arm  . APPENDECTOMY    . AV FISTULA PLACEMENT  12/05/2011   Procedure: ARTERIOVENOUS (AV) FISTULA CREATION;LLEFT ARM  Surgeon: Conrad Pena,  MD;  Location: [facility];  Service: Vascular;  Laterality: Left;  RADIO-CEPHALIC  fistula left arm  . AV FISTULA PLACEMENT  01/11/2012   Procedure: ARTERIOVENOUS (AV) FISTULA CREATION;  Surgeon: Conrad [place], MD;  Location: [facility];  Service: Vascular;  Laterality: Left;  Creation of left brachial cephalic arteriovenous fistula  . BASCILIC VEIN TRANSPOSITION Left 12/27/2016   Procedure: BASILIC VEIN TRANSPOSITION LEFT UPPER ARM FIRST STAGE;  Surgeon: Conrad Seacliff, MD;  Location: [facility];  Service: Vascular;  Laterality: Left;  . BASCILIC VEIN TRANSPOSITION Left 01/31/2017   Procedure: LEFT ARM BASILIC VEIN TRANSPOSITION, SECOND STAGE;  Surgeon: Conrad Nazlini, MD;  Location: [facility];  Service: Vascular;  Laterality: Left;  . CARDIAC CATHETERIZATION  04-05-2010   checking for blockage but none-[facility]  . COLONOSCOPY    . CYSTOSCOPY W/ STONE MANIPULATION     "laser once" (01/22/2013)  . HEMATOMA EVACUATION Left 05/09/2017   Procedure: EVACUATION HEMATOMA LEFT ARM;  Surgeon: Conrad Tushka, MD;  Location: [facility];  Service: Vascular;  Laterality: Left;  . HERNIA REPAIR    . INGUINAL HERNIA REPAIR Right 11/06/2015   Procedure: OPEN HERNIA REPAIR  RIGHT INGUINAL ADULT;  Surgeon: [name], MD;  Location: [facility];  Service: General;  Laterality: Right;  with MESH  . INSERTION OF DIALYSIS CATHETER Right 10/05/2016   Procedure: INSERTION OF right internal jugular DIALYSIS CATHETER;  Surgeon: [name], MD;  Location: [facility];  Service: Vascular;  Laterality: Right;  . INSERTION OF MESH  03/20/2012   Procedure: INSERTION OF MESH;  UMB Surgeon: [name], MD;  Location: [facility];  Service: General;  Laterality: N/A;  . INSERTION OF MESH N/A 01/22/2013   Procedure: INSERTION OF MESH;  Surgeon: [name], MD;  Location: [facility];  Service: General;  Laterality: N/A;  . LAPAROTOMY  04/02/2012   Procedure: EXPLORATORY LAPAROTOMY;  Surgeon: [name], MD;  Location: [facility];  Service: General;  Laterality: N/A;   Exploratory Laparotomy with resection of small intestine  . LEFT HEART CATHETERIZATION WITH CORONARY ANGIOGRAM N/A 05/14/2013   Procedure: LEFT HEART CATHETERIZATION WITH CORONARY ANGIOGRAM;  Surgeon: Sinclair Grooms, MD;  Location: [facility];  Service: Cardiovascular;  Laterality: N/A;  . LIGATION OF ARTERIOVENOUS  FISTULA Left 12/27/2016   Procedure: LIGATION/EXCISION OF LEFT UPPER ARM ARTERIOVENOUS  FISTULA;  EVACUATION OF HEMATOMA;  Surgeon: Conrad , MD;  Location: [facility];  Service: Vascular;  Laterality: Left;  . REVISON OF ARTERIOVENOUS FISTULA Left 10/05/2016   Procedure: REVISON OF left arm ARTERIOVENOUS FISTULA;  Surgeon: [name], MD;  Location: [facility];  Service: Vascular;  Laterality: Left;  . TONSILLECTOMY    . UMBILICAL HERNIA REPAIR  03/20/2012   Procedure: HERNIA REPAIR  UMBILICAL ADULT;  Surgeon: [name], MD;  Location: [facility];  Service: General;  Laterality: N/A;  . UMBILICAL HERNIA REPAIR  01/22/2013   preperitoneal open procedure due to significant adhesions/notes 01/22/2013  . VENTRAL HERNIA REPAIR N/A 01/22/2013   Procedure: ATTEMPTED LAPAROSCOPIC VENTRAL HERNIA CONVERTED TO OPEN;  Surgeon: [name], MD;  Location[facility];  Service: General;  Laterality: N/A;    FAMILY HISTORY Family History  Problem Relation Age of Onset  . Heart disease Mother   . Hyperlipidemia Mother   . Hypertension Mother   . Kidney disease Father   . Stroke Father   . Kidney disease Brother   . Amblyopia Neg Hx   . Blindness Neg Hx   . Cataracts Neg Hx   . Diabetes Neg Hx   . Glaucoma Neg Hx   . Macular degeneration Neg Hx   . Retinal detachment Neg Hx   . Strabismus Neg Hx   . Retinitis pigmentosa Neg Hx     SOCIAL HISTORY Social History   Tobacco Use  . Smoking status: Never Smoker  . Smokeless tobacco: Never Used  Substance Use Topics  . Alcohol use: No    Alcohol/week: 0.0 oz  . Drug use: No         OPHTHALMIC EXAM:  Base Eye Exam    Visual  Acuity (Snellen - Linear)      Right Left   Dist cc 20/80 -2 20/20 -1   Dist ph cc NI    Correction:  Glasses       Tonometry (Tonopen, 11:11 AM)      Right Left   Pressure 20 19       Pupils      Dark Light Shape React APD   Right        Left 3 3 Round Minimal None  OD dilated       Visual Fields (Counting fingers)      Left Right    Full Full       Extraocular Movement      Right Left    Full Full       Neuro/Psych    Oriented x3:  Yes   Mood/Affect:  Normal       Dilation    Both eyes:  1.0% Mydriacyl, 2.5% Phenylephrine @ 11:12 AM        Slit Lamp and Fundus Exam    Slit Lamp Exam      Right Left   Lids/Lashes Dermatochalasis - upper lid Dermatochalasis - upper lid   Conjunctiva/Sclera Mild Melanosis, trace Injection Mild Melanosis   Cornea Arcus, scattered mild K haze - greatest inferiorly, less stellate KP inferiorly, trace nasal microcystic edema - vastly improved Arcus   Anterior Chamber 0.5+ Cell Deep and quiet   Iris Round and dilated Round and dilated   Lens 2+ Nuclear sclerosis, 2+ Cortical cataract 2+ Nuclear sclerosis, 2+ Cortical cataract   Vitreous Vitreous syneresis, no cell Vitreous syneresis       Fundus Exam      Right Left   Disc vascular loops with hemorrhage superiorly, +heme Normal   C/D Ratio 0.5 0.55   Macula Blunted foveal reflex, persistent dark intraretinal hemorrhage SN to macula, scattered DBH superior macula - improving Flat, Good foveal reflex, No heme or edema   Vessels Vascular attenuation, dilated and Tortuous, Copper wiring superiorly Mild Copper wiring, mildly Tortuous   Periphery Attached, DBH superiorly and temporally Attached, pigmented lattice with 3 atrophic holes  at 1030 ora - good laser surrounding          IMAGING AND PROCEDURES  Imaging and Procedures for 07/26/17  OCT, Retina - OU - Both Eyes       Right Eye Quality was good. Central Foveal Thickness: 264. Progression has been stable. Findings  include normal foveal contour, outer retinal atrophy, intraretinal fluid, no SRF (Trace cystic changes).   Left Eye Quality was borderline. Central Foveal Thickness: 273. Progression has been stable. Findings include normal foveal contour, no IRF, no SRF, epiretinal membrane.   Notes *Images captured and stored on drive  Diagnosis / Impression:  OD: focal area of retinal edema/IRF SN to fovea -- persistent retinal edema and IRF OS: trace ERM  Clinical management:  See below  Abbreviations: NFP - Normal foveal profile. CME - cystoid macular edema. PED - pigment epithelial detachment. IRF - intraretinal fluid. SRF - subretinal fluid. EZ - ellipsoid zone. ERM - epiretinal membrane. ORA - outer retinal atrophy. ORT - outer retinal tubulation. SRHM - subretinal hyper-reflective material                  ASSESSMENT/PLAN:    ICD-10-CM   1. Acute anterior uveitis of right eye H20.9   2. Branch retinal vein occlusion of right eye with macular edema H34.8310   3. Retinal edema H35.81 OCT, Retina - OU - Both Eyes  4. Lattice degeneration of left retina H35.412   5. Atrophic retinal break, multiple, left eye H33.332   6. Combined forms of age-related cataract of both eyes H25.813    1. Acute Anterior Uveitis OD-  - symptoms improving - FA on 3.18.19 without posterior involvement, vasculitis or leakage OD - by history, this is a first episode, no prior episodes -  since first episode, will defer work up for now - subjective improvement in symptoms of eye pain and redness.  - objective improvement in conj injection and corneal edema - reduce PF OD to q2hr while awake, - continue cyclopentolate OD BID, cosopt OD BID - IOP OD 20, today - F/U April 2nd  2, 3. BRVO w/ CME OD - s/p IVA OD #1 (12.21.18), #2 (01.18.19), #3 (02.14.19) - repeat OCT shows interval improvement in central CME tracking from sup nasal macula and interval improvement in IRF - due for IVA #4 today but will  defer due to active uveitis as described above - F/U April 2nd -- possible injection  4, 5. Lattice degeneration with atrophic holes OS-  - peripheral holes superiorly at 1030 without SRF or RD - S/P laser retinopexy OS (01.04.19) - good laser in place  6. Combined forms age-related cataract OU-  - The symptoms of cataract, surgical options, and treatments and risks were discussed with patient. - discussed diagnosis and progression - not yet visually significant - monitor for now   Ophthalmic Meds Ordered this visit:  No orders of the defined types were placed in this encounter.      Return in about 11 days (around 08/01/2017) for F/U BRVO w/ CME OD.  There are no Patient Instructions on file for this visit.   Explained the diagnoses, plan, and follow up with the patient and they expressed understanding.  Patient expressed understanding of the importance of proper follow up care.   This document serves as a record of services personally performed by Gardiner Sleeper, MD, PhD. It was created on their behalf by Catha Brow, [place], a certified ophthalmic assistant. The creation of this record  is the provider's dictation and/or activities during the visit.  Electronically signed by: Catha Brow, COA  07/26/17 11:10 PM   Gardiner Sleeper, M.D., Ph.D. Diseases & Surgery of the Retina and [place] 07/26/17  I have reviewed the above documentation for accuracy and completeness, and I agree with the above. Gardiner Sleeper, M.D., Ph.D. 07/26/17 11:10 PM    Abbreviations: M myopia (nearsighted); A astigmatism; H hyperopia (farsighted); P presbyopia; Mrx spectacle prescription;  CTL contact lenses; OD right eye; OS left eye; OU both eyes  XT exotropia; ET esotropia; PEK punctate epithelial keratitis; PEE punctate epithelial erosions; DES dry eye syndrome; MGD meibomian gland dysfunction; ATs artificial tears; PFAT's preservative free artificial  tears; [place] nuclear sclerotic cataract; PSC posterior subcapsular cataract; ERM epi-retinal membrane; PVD posterior vitreous detachment; RD retinal detachment; DM diabetes mellitus; DR diabetic retinopathy; NPDR non-proliferative diabetic retinopathy; PDR proliferative diabetic retinopathy; CSME clinically significant macular edema; DME diabetic macular edema; dbh dot blot hemorrhages; CWS cotton wool spot; POAG primary open angle glaucoma; C/D cup-to-disc ratio; HVF humphrey visual field; GVF goldmann visual field; OCT optical coherence tomography; IOP intraocular pressure; BRVO Branch retinal vein occlusion; CRVO central retinal vein occlusion; CRAO central retinal artery occlusion; BRAO branch retinal artery occlusion; RT retinal tear; SB scleral buckle; PPV pars plana vitrectomy; VH Vitreous hemorrhage; PRP panretinal laser photocoagulation; IVK intravitreal kenalog; VMT vitreomacular traction; MH Macular hole;  NVD neovascularization of the disc; NVE neovascularization elsewhere; AREDS age related eye disease study; ARMD age related macular degeneration; POAG primary open angle glaucoma; EBMD epithelial/anterior basement membrane dystrophy; ACIOL anterior chamber intraocular lens; IOL intraocular lens; PCIOL posterior chamber intraocular lens; Phaco/IOL phacoemulsification with intraocular lens placement; [place] photorefractive keratectomy; LASIK laser assisted in situ keratomileusis; HTN hypertension; DM diabetes mellitus; COPD chronic obstructive pulmonary disease

## 2017-07-24 DIAGNOSIS — N2581 Secondary hyperparathyroidism of renal origin: Secondary | ICD-10-CM | POA: Diagnosis not present

## 2017-07-24 DIAGNOSIS — N186 End stage renal disease: Secondary | ICD-10-CM | POA: Diagnosis not present

## 2017-07-25 DIAGNOSIS — Z452 Encounter for adjustment and management of vascular access device: Secondary | ICD-10-CM | POA: Diagnosis not present

## 2017-07-26 ENCOUNTER — Encounter (INDEPENDENT_AMBULATORY_CARE_PROVIDER_SITE_OTHER): Payer: Self-pay | Admitting: Ophthalmology

## 2017-07-26 DIAGNOSIS — N186 End stage renal disease: Secondary | ICD-10-CM | POA: Diagnosis not present

## 2017-07-26 DIAGNOSIS — N2581 Secondary hyperparathyroidism of renal origin: Secondary | ICD-10-CM | POA: Diagnosis not present

## 2017-07-27 NOTE — Progress Notes (Signed)
[facility] Note  08/01/2017     CHIEF COMPLAINT Patient presents for Retina Follow Up   HISTORY OF PRESENT ILLNESS: Michael [name] is a 62 y.o. male who presents to the clinic today for:   HPI    Retina Follow Up    Patient presents with  CRVO/BRVO.  In left eye.  This started 3 weeks ago.  Severity is mild.  Since onset it is gradually improving.  I, the attending physician,  performed the HPI with the patient and updated documentation appropriately.          Comments    F/U anterior Uvetis OD/BRVO w/CME OD. Patient states his vision is improving, denies burning, itching and ocular pain. Pt is using steroid eye gtt's as directed. Pt is ready for Avastin today.        Last edited by [name], MD on 08/01/2017  8:20 AM. (History)     Referring physician: [name], MD [place], [place] [id number]  HISTORICAL INFORMATION:   Selected notes from the MEDICAL RECORD NUMBER Referred by Dr. [name]. Hecker for concern of HRVO OD; LEE- 11.30.18 ([name]) {BCVA OD: 20/100-1 OS: 20/20-2] Ocular Hx- cataract OU PMH- HTN, high chol, kidney disease, sleep apnea, emphysema    CURRENT MEDICATIONS: Current Outpatient Medications (Ophthalmic Drugs)  Medication Sig  . prednisoLONE acetate (PRED FORTE) 1 % ophthalmic suspension Place 1 drop into the right eye every hour while awake.   No current facility-administered medications for this visit.  (Ophthalmic Drugs)   Current Outpatient Medications (Other)  Medication Sig  . aspirin EC 81 MG EC tablet Take 1 tablet (81 mg total) by mouth daily.  [name] atorvastatin (LIPITOR) 40 MG tablet Take 40 mg by mouth every evening.   . calcium acetate (PHOSLO) 667 MG capsule Take 1,334 mg by mouth 3 (three) times daily with meals.   . metoprolol succinate (TOPROL-XL) 100 MG 24 hr tablet Take 50 mg by mouth See admin instructions. Take 50 mg by mouth daily on Tuesday, Thursday and Saturday.  .  nitroGLYCERIN (NITROSTAT) 0.4 MG SL tablet Place 1 tablet (0.4 mg total) under the tongue every 5 (five) minutes x 3 doses as needed for chest pain.  [name] oxyCODONE-acetaminophen (ROXICET) 5-325 MG tablet Take 1 tablet by mouth every 6 (six) hours as needed.  . SENSIPAR 60 MG tablet Take 60 mg by mouth every evening.    Current Facility-Administered Medications (Other)  Medication Route  . Bevacizumab (AVASTIN) SOLN 1.25 mg Intravitreal  . Bevacizumab (AVASTIN) SOLN 1.25 mg Intravitreal  . Bevacizumab (AVASTIN) SOLN 1.25 mg Intravitreal  . Bevacizumab (AVASTIN) SOLN 1.25 mg Intravitreal      REVIEW OF SYSTEMS: ROS    Positive for: Eyes   Negative for: Constitutional, Gastrointestinal, Neurological, Skin, Genitourinary, Musculoskeletal, HENT, Endocrine, Cardiovascular, Respiratory, Psychiatric, Allergic/Imm, Heme/Lymph   Last edited by Zenovia Jordan, LPN on 10/02/1306  6:57 AM. (History)       ALLERGIES No Known Allergies  PAST MEDICAL HISTORY Past Medical History:  Diagnosis Date  . Dyspnea    on exertion  . ESRD (end stage renal disease) ([place])    Hemo- MWF, Polycystic kidney disease  . Fatigue   . History of kidney stones    removal of stone- cysto  . Hyperlipidemia   . Hyperparathyroidism, secondary renal ([place])   . Hypertension   . Hypoxemia 12/12/2013  . Nonischemic cardiomyopathy ([place])    Er 25% 2015, 55 %  2013  . OSA on CPAP    no longer using cpap  . OSA on CPAP 03/24/2014  . Pneumonia    2015ish  . Ventricular tachycardia//Freq PVCs    Past Surgical History:  Procedure Laterality Date  . A/V FISTULAGRAM Left 04/27/2017   Procedure: A/V FISTULAGRAM;  Surgeon: Conrad [place], MD;  Location: [place] [facility];  Service: Cardiovascular;  Laterality: Left;  lt arm  . APPENDECTOMY    . AV FISTULA PLACEMENT  12/05/2011   Procedure: ARTERIOVENOUS (AV) FISTULA CREATION;LLEFT ARM  Surgeon: Conrad Ravenna, MD;  Location: [place];  Service: Vascular;  Laterality: Left;   RADIO-CEPHALIC  fistula left arm  . AV FISTULA PLACEMENT  01/11/2012   Procedure: ARTERIOVENOUS (AV) FISTULA CREATION;  Surgeon: Conrad [place], MD;  Location: [place];  Service: Vascular;  Laterality: Left;  Creation of left brachial cephalic arteriovenous fistula  . BASCILIC VEIN TRANSPOSITION Left 12/27/2016   Procedure: BASILIC VEIN TRANSPOSITION LEFT UPPER ARM FIRST STAGE;  Surgeon: Conrad Fajardo, MD;  Location: [place];  Service: Vascular;  Laterality: Left;  . BASCILIC VEIN TRANSPOSITION Left 01/31/2017   Procedure: LEFT ARM BASILIC VEIN TRANSPOSITION, SECOND STAGE;  Surgeon: Conrad [place], MD;  Location: [place];  Service: Vascular;  Laterality: Left;  . CARDIAC CATHETERIZATION  04-05-2010   checking for blockage but none-[facility]  . COLONOSCOPY    . CYSTOSCOPY W/ STONE MANIPULATION     "laser once" (01/22/2013)  . HEMATOMA EVACUATION Left 05/09/2017   Procedure: EVACUATION HEMATOMA LEFT ARM;  Surgeon: Conrad , MD;  Location: [facility];  Service: Vascular;  Laterality: Left;  . HERNIA REPAIR    . INGUINAL HERNIA REPAIR Right 11/06/2015   Procedure: OPEN HERNIA REPAIR  RIGHT INGUINAL ADULT;  Surgeon: [name], MD;  Location: [facility];  Service: General;  Laterality: Right;  with MESH  . INSERTION OF DIALYSIS CATHETER Right 10/05/2016   Procedure: INSERTION OF right internal jugular DIALYSIS CATHETER;  Surgeon: [name], MD;  Location: [facility] [place];  Service: Vascular;  Laterality: Right;  . INSERTION OF MESH  03/20/2012   Procedure: INSERTION OF MESH;  UMB Surgeon: [name], MD;  Location: [place];  Service: General;  Laterality: N/A;  . INSERTION OF MESH N/A 01/22/2013   Procedure: INSERTION OF MESH;  Surgeon: [name], MD;  Location: [place];  Service: General;  Laterality: N/A;  . LAPAROTOMY  04/02/2012   Procedure: EXPLORATORY LAPAROTOMY;  Surgeon: [name], MD;  Location: [place];  Service: General;  Laterality: N/A;  Exploratory Laparotomy with resection of small intestine  .  LEFT HEART CATHETERIZATION WITH CORONARY ANGIOGRAM N/A 05/14/2013   Procedure: LEFT HEART CATHETERIZATION WITH CORONARY ANGIOGRAM;  Surgeon: Sinclair Grooms, MD;  Location: [facility];  Service: Cardiovascular;  Laterality: N/A;  . LIGATION OF ARTERIOVENOUS  FISTULA Left 12/27/2016   Procedure: LIGATION/EXCISION OF LEFT UPPER ARM ARTERIOVENOUS  FISTULA;  EVACUATION OF HEMATOMA;  Surgeon: Conrad , MD;  Location: [place];  Service: Vascular;  Laterality: Left;  . REVISON OF ARTERIOVENOUS FISTULA Left 10/05/2016   Procedure: REVISON OF left arm ARTERIOVENOUS FISTULA;  Surgeon: [name], MD;  Location: [place];  Service: Vascular;  Laterality: Left;  . TONSILLECTOMY    . UMBILICAL HERNIA REPAIR  03/20/2012   Procedure: HERNIA REPAIR UMBILICAL ADULT;  Surgeon: [name], MD;  Location: [facility];  Service: General;  Laterality: N/A;  . UMBILICAL HERNIA REPAIR  01/22/2013  preperitoneal open procedure due to significant adhesions/notes 01/22/2013  . VENTRAL HERNIA REPAIR N/A 01/22/2013   Procedure: ATTEMPTED LAPAROSCOPIC VENTRAL HERNIA CONVERTED TO OPEN;  Surgeon: [name], MD;  Location: [facility];  Service: General;  Laterality: N/A;    FAMILY HISTORY Family History  Problem Relation Age of Onset  . Heart disease Mother   . Hyperlipidemia Mother   . Hypertension Mother   . Kidney disease Father   . Stroke Father   . Kidney disease Brother   . Amblyopia Neg Hx   . Blindness Neg Hx   . Cataracts Neg Hx   . Diabetes Neg Hx   . Glaucoma Neg Hx   . Macular degeneration Neg Hx   . Retinal detachment Neg Hx   . Strabismus Neg Hx   . Retinitis pigmentosa Neg Hx     SOCIAL HISTORY Social History   Tobacco Use  . Smoking status: Never Smoker  . Smokeless tobacco: Never Used  Substance Use Topics  . Alcohol use: No    Alcohol/week: 0.0 oz  . Drug use: No         OPHTHALMIC EXAM:  Base Eye Exam    Visual Acuity (Snellen - Linear)      Right Left   Dist [place] 20/70  20/40   Dist ph [place] 20/50 20/25 +2       Tonometry (Tonopen, 8:11 AM)      Right Left   Pressure 19 21       Pupils      Dark Light Shape React APD   Right 8 8 Round NR None   Left 4 3 Round Slow None       Visual Fields (Counting fingers)      Left Right    Full Full       Extraocular Movement      Right Left    Full, Ortho Full, Ortho       Neuro/Psych    Oriented x3:  Yes   Mood/Affect:  Normal       Dilation    Both eyes:  1.0% Mydriacyl, 2.5% Phenylephrine @ 8:11 AM        Slit Lamp and Fundus Exam    Slit Lamp Exam      Right Left   Lids/Lashes Dermatochalasis - upper lid Dermatochalasis - upper lid   Conjunctiva/Sclera Mild Melanosis, Nasal Pinguecula Mild Melanosis   Cornea Arcus, scattered mild K haze - greatest inferiorly and nasally, trace KP, trace nasal microcystic edema - vastly improved Arcus   Anterior Chamber 0.5+ Cell Deep and quiet   Iris Round and dilated Round and dilated   Lens 2+ Nuclear sclerosis, 2+ Cortical cataract 2+ Nuclear sclerosis, 2+ Cortical cataract   Vitreous Vitreous syneresis, no cell Vitreous syneresis       Fundus Exam      Right Left   Disc vascular loops with hemorrhage superiorly, +heme Normal   C/D Ratio 0.5 0.5   Macula Mildly Blunted foveal reflex, persistent dark intraretinal hemorrhage SN to macula, scattered DBH superior macula - resolved Flat, Good foveal reflex, No heme or edema   Vessels Vascular attenuation, dilated and Tortuous, Copper wiring superiorly Mild Copper wiring, mildly Tortuous   Periphery Attached, DBH superiorly and temporally Attached, pigmented lattice with 3 atrophic holes at 1030 ora - good laser surrounding          IMAGING AND PROCEDURES  Imaging and Procedures for 08/01/17  OCT, Retina - OU - Both  Eyes       Right Eye Quality was good. Central Foveal Thickness: 263. Progression has been stable. Findings include normal foveal contour, outer retinal atrophy, intraretinal fluid, no  SRF (Trace cystic changes).   Left Eye Quality was borderline. Central Foveal Thickness: 269. Progression has been stable. Findings include normal foveal contour, no IRF, no SRF, epiretinal membrane.   Notes *Images captured and stored on drive  Diagnosis / Impression:  OD: focal area of retinal edema/IRF SN to fovea -- persistent retinal edema and IRF OS: trace ERM  Clinical management:  See below  Abbreviations: NFP - Normal foveal profile. CME - cystoid macular edema. PED - pigment epithelial detachment. IRF - intraretinal fluid. SRF - subretinal fluid. EZ - ellipsoid zone. ERM - epiretinal membrane. ORA - outer retinal atrophy. ORT - outer retinal tubulation. SRHM - subretinal hyper-reflective material         Intravitreal Injection, Pharmacologic Agent - OD - Right Eye       Time Out 08/01/2017. 8:32 AM. Confirmed correct patient, procedure, site, and patient consented.   Anesthesia Topical anesthesia was used. Anesthetic medications included Lidocaine 2%, Tetracaine 0.5%.   Procedure Preparation included 5% betadine to ocular surface, eyelid speculum. A supplied needle was used.   Injection: 1.25 mg Bevacizumab 1.25mg /0.53ml   NDC: 24235-361-44    Lot: [id number]     Expiration Date: 09/04/2017   Route: Intravitreal   Site: Right Eye   Waste: 0 mg  Post-op Post injection exam found visual acuity of at least counting fingers. The patient tolerated the procedure well. There were no complications. The patient received written and verbal post procedure care education.                 ASSESSMENT/PLAN:    ICD-10-CM   1. Acute anterior uveitis of right eye H20.00 Intravitreal Injection, Pharmacologic Agent - OD - Right Eye    Bevacizumab (AVASTIN) SOLN 1.25 mg  2. Branch retinal vein occlusion of right eye with macular edema H34.8310   3. Retinal edema H35.81 OCT, Retina - OU - Both Eyes  4. Lattice degeneration of left retina H35.412   5. Atrophic retinal  break, multiple, left eye H33.332   6. Combined forms of age-related cataract of both eyes H25.813    1. Acute Anterior Uveitis OD-  - symptoms improving - FA on 3.18.19 without posterior involvement, vasculitis or leakage OD - by history, this is a first episode, no prior episodes - since first episode, will defer work up for now - subjective improvement in symptoms of eye pain and redness.  - objective improvement in conj injection, corneal edema, AC cell and KPs - IOP OD 19, today - dec PF OD to 6x/daily while awake, - continue cosopt OD BID - D/C cyclopentolate OD  - F/U 2 weeks  2, 3. BRVO w/ CME OD - s/p IVA OD #1 (12.21.18), #2 (01.18.19), #3 (02.14.19) -- 6 wk interval - repeat OCT shows interval improvement in central CME tracking from sup nasal macula and interval improvement in IRF - recommend IVA #4 today (04.02.19) - RBA of procedure discussed, questions answered - informed consent obtained and signed - see procedure note - F/U 7-8 weeks for repeat assessment of BRVO w/ CME OD ~5.21.19  4, 5. Lattice degeneration with atrophic holes OS-  - peripheral holes superiorly at 1030 without SRF or RD - S/P laser retinopexy OS (01.04.19) - good laser in place  6. Combined forms age-related cataract OU-  - The  symptoms of cataract, surgical options, and treatments and risks were discussed with patient. - discussed diagnosis and progression - not yet visually significant - monitor for now   Ophthalmic Meds Ordered this visit:  Meds ordered this encounter  Medications  . Bevacizumab (AVASTIN) SOLN 1.25 mg       Return in about 2 weeks (around 08/14/2017) for F/U ant. uveitis OD.  There are no Patient Instructions on file for this visit.   Explained the diagnoses, plan, and follow up with the patient and they expressed understanding.  Patient expressed understanding of the importance of proper follow up care.   This document serves as a record of services personally  performed by Gardiner Sleeper, MD, PhD. It was created on their behalf by Catha Brow, [place], a certified ophthalmic assistant. The creation of this record is the provider's dictation and/or activities during the visit.  Electronically signed by: Catha Brow, [place]  08/01/17 8:43 AM   Gardiner Sleeper, M.D., Ph.D. Diseases & Surgery of the Retina and [place] 08/01/17   I have reviewed the above documentation for accuracy and completeness, and I agree with the above. Gardiner Sleeper, M.D., Ph.D. 08/01/17 8:43 AM    Abbreviations: M myopia (nearsighted); A astigmatism; H hyperopia (farsighted); P presbyopia; Mrx spectacle prescription;  CTL contact lenses; OD right eye; OS left eye; OU both eyes  XT exotropia; ET esotropia; PEK punctate epithelial keratitis; PEE punctate epithelial erosions; DES dry eye syndrome; MGD meibomian gland dysfunction; ATs artificial tears; PFAT's preservative free artificial tears; [place] nuclear sclerotic cataract; PSC posterior subcapsular cataract; ERM epi-retinal membrane; PVD posterior vitreous detachment; RD retinal detachment; DM diabetes mellitus; DR diabetic retinopathy; NPDR non-proliferative diabetic retinopathy; PDR proliferative diabetic retinopathy; CSME clinically significant macular edema; DME diabetic macular edema; dbh dot blot hemorrhages; CWS cotton wool spot; POAG primary open angle glaucoma; C/D cup-to-disc ratio; HVF humphrey visual field; GVF goldmann visual field; OCT optical coherence tomography; IOP intraocular pressure; BRVO Branch retinal vein occlusion; CRVO central retinal vein occlusion; CRAO central retinal artery occlusion; BRAO branch retinal artery occlusion; RT retinal tear; SB scleral buckle; PPV pars plana vitrectomy; VH Vitreous hemorrhage; PRP panretinal laser photocoagulation; IVK intravitreal kenalog; VMT vitreomacular traction; MH Macular hole;  NVD neovascularization of the disc; NVE  neovascularization elsewhere; AREDS age related eye disease study; ARMD age related macular degeneration; POAG primary open angle glaucoma; EBMD epithelial/anterior basement membrane dystrophy; ACIOL anterior chamber intraocular lens; IOL intraocular lens; PCIOL posterior chamber intraocular lens; Phaco/IOL phacoemulsification with intraocular lens placement; [place] photorefractive keratectomy; LASIK laser assisted in situ keratomileusis; HTN hypertension; DM diabetes mellitus; COPD chronic obstructive pulmonary disease

## 2017-07-28 DIAGNOSIS — N186 End stage renal disease: Secondary | ICD-10-CM | POA: Diagnosis not present

## 2017-07-28 DIAGNOSIS — N2581 Secondary hyperparathyroidism of renal origin: Secondary | ICD-10-CM | POA: Diagnosis not present

## 2017-07-31 DIAGNOSIS — N2581 Secondary hyperparathyroidism of renal origin: Secondary | ICD-10-CM | POA: Diagnosis not present

## 2017-07-31 DIAGNOSIS — Q612 Polycystic kidney, adult type: Secondary | ICD-10-CM | POA: Diagnosis not present

## 2017-07-31 DIAGNOSIS — N186 End stage renal disease: Secondary | ICD-10-CM | POA: Diagnosis not present

## 2017-07-31 DIAGNOSIS — Z992 Dependence on renal dialysis: Secondary | ICD-10-CM | POA: Diagnosis not present

## 2017-08-01 ENCOUNTER — Encounter (INDEPENDENT_AMBULATORY_CARE_PROVIDER_SITE_OTHER): Payer: Self-pay | Admitting: Ophthalmology

## 2017-08-01 ENCOUNTER — Ambulatory Visit (INDEPENDENT_AMBULATORY_CARE_PROVIDER_SITE_OTHER): Payer: Medicare Other | Admitting: Ophthalmology

## 2017-08-01 DIAGNOSIS — H34831 Tributary (branch) retinal vein occlusion, right eye, with macular edema: Secondary | ICD-10-CM | POA: Diagnosis not present

## 2017-08-01 DIAGNOSIS — H25813 Combined forms of age-related cataract, bilateral: Secondary | ICD-10-CM

## 2017-08-01 DIAGNOSIS — H3581 Retinal edema: Secondary | ICD-10-CM

## 2017-08-01 DIAGNOSIS — H35412 Lattice degeneration of retina, left eye: Secondary | ICD-10-CM | POA: Diagnosis not present

## 2017-08-01 DIAGNOSIS — H2000 Unspecified acute and subacute iridocyclitis: Secondary | ICD-10-CM | POA: Diagnosis not present

## 2017-08-01 DIAGNOSIS — H33332 Multiple defects of retina without detachment, left eye: Secondary | ICD-10-CM | POA: Diagnosis not present

## 2017-08-01 MED ORDER — BEVACIZUMAB CHEMO INJECTION 1.25MG/0.05ML SYRINGE FOR KALEIDOSCOPE
1.2500 mg | INTRAVITREAL | Status: DC
  Administered 2017-08-01: 1.25 mg via INTRAVITREAL

## 2017-08-02 DIAGNOSIS — N186 End stage renal disease: Secondary | ICD-10-CM | POA: Diagnosis not present

## 2017-08-02 DIAGNOSIS — N2581 Secondary hyperparathyroidism of renal origin: Secondary | ICD-10-CM | POA: Diagnosis not present

## 2017-08-04 DIAGNOSIS — N2581 Secondary hyperparathyroidism of renal origin: Secondary | ICD-10-CM | POA: Diagnosis not present

## 2017-08-04 DIAGNOSIS — N186 End stage renal disease: Secondary | ICD-10-CM | POA: Diagnosis not present

## 2017-08-07 DIAGNOSIS — N186 End stage renal disease: Secondary | ICD-10-CM | POA: Diagnosis not present

## 2017-08-07 DIAGNOSIS — N2581 Secondary hyperparathyroidism of renal origin: Secondary | ICD-10-CM | POA: Diagnosis not present

## 2017-08-09 DIAGNOSIS — N2581 Secondary hyperparathyroidism of renal origin: Secondary | ICD-10-CM | POA: Diagnosis not present

## 2017-08-09 DIAGNOSIS — N186 End stage renal disease: Secondary | ICD-10-CM | POA: Diagnosis not present

## 2017-08-10 NOTE — Progress Notes (Deleted)
[facility] Note  08/14/2017     CHIEF COMPLAINT Patient presents for No chief complaint on file.   HISTORY OF PRESENT ILLNESS: Michael [name] is a 62 y.o. male who presents to the clinic today for:    Referring physician: [name], MD [place], [place] [address]  HISTORICAL INFORMATION:   Selected notes from the [facility] Referred by Dr. [name]. Hecker for concern of HRVO OD; LEE- 11.30.18 ([name]) {BCVA OD: 20/100-1 OS: 20/20-2] Ocular Hx- cataract OU PMH- HTN, high chol, kidney disease, sleep apnea, emphysema    CURRENT MEDICATIONS: Current Outpatient Medications (Ophthalmic Drugs)  Medication Sig  . prednisoLONE acetate (PRED FORTE) 1 % ophthalmic suspension Place 1 drop into the right eye every hour while awake.   No current facility-administered medications for this visit.  (Ophthalmic Drugs)   Current Outpatient Medications (Other)  Medication Sig  . aspirin EC 81 MG EC tablet Take 1 tablet (81 mg total) by mouth daily.  [name] atorvastatin (LIPITOR) 40 MG tablet Take 40 mg by mouth every evening.   . calcium acetate (PHOSLO) 667 MG capsule Take 1,334 mg by mouth 3 (three) times daily with meals.   . metoprolol succinate (TOPROL-XL) 100 MG 24 hr tablet Take 50 mg by mouth See admin instructions. Take 50 mg by mouth daily on Tuesday, Thursday and Saturday.  . nitroGLYCERIN (NITROSTAT) 0.4 MG SL tablet Place 1 tablet (0.4 mg total) under the tongue every 5 (five) minutes x 3 doses as needed for chest pain.  [name] oxyCODONE-acetaminophen (ROXICET) 5-325 MG tablet Take 1 tablet by mouth every 6 (six) hours as needed.  . SENSIPAR 60 MG tablet Take 60 mg by mouth every evening.    Current Facility-Administered Medications (Other)  Medication Route  . Bevacizumab (AVASTIN) SOLN 1.25 mg Intravitreal  . Bevacizumab (AVASTIN) SOLN 1.25 mg Intravitreal  . Bevacizumab (AVASTIN) SOLN 1.25 mg Intravitreal  .  Bevacizumab (AVASTIN) SOLN 1.25 mg Intravitreal      REVIEW OF SYSTEMS:    ALLERGIES No Known Allergies  PAST MEDICAL HISTORY Past Medical History:  Diagnosis Date  . Dyspnea    on exertion  . ESRD (end stage renal disease) ([place])    Hemo- MWF, Polycystic kidney disease  . Fatigue   . History of kidney stones    removal of stone- cysto  . Hyperlipidemia   . Hyperparathyroidism, secondary renal ([place])   . Hypertension   . Hypoxemia 12/12/2013  . Nonischemic cardiomyopathy ([place])    Er 25% 2015, 55 % 2013  . OSA on CPAP    no longer using cpap  . OSA on CPAP 03/24/2014  . Pneumonia    2015ish  . Ventricular tachycardia//Freq PVCs    Past Surgical History:  Procedure Laterality Date  . A/V FISTULAGRAM Left 04/27/2017   Procedure: A/V FISTULAGRAM;  Surgeon: Conrad [place], MD;  Location: [facility];  Service: Cardiovascular;  Laterality: Left;  lt arm  . APPENDECTOMY    . AV FISTULA PLACEMENT  12/05/2011   Procedure: ARTERIOVENOUS (AV) FISTULA CREATION;LLEFT ARM  Surgeon: Conrad Centre, MD;  Location: [facility];  Service: Vascular;  Laterality: Left;  RADIO-CEPHALIC  fistula left arm  . AV FISTULA PLACEMENT  01/11/2012   Procedure: ARTERIOVENOUS (AV) FISTULA CREATION;  Surgeon: Conrad [place], MD;  Location: [facility];  Service: Vascular;  Laterality: Left;  Creation of left brachial cephalic arteriovenous fistula  . [place]  TRANSPOSITION Left 12/27/2016   Procedure: BASILIC VEIN TRANSPOSITION LEFT UPPER ARM FIRST STAGE;  Surgeon: Conrad [place], MD;  Location: [facility];  Service: Vascular;  Laterality: Left;  . BASCILIC VEIN TRANSPOSITION Left 01/31/2017   Procedure: LEFT ARM BASILIC VEIN TRANSPOSITION, SECOND STAGE;  Surgeon: Conrad Leola, MD;  Location: [facility];  Service: Vascular;  Laterality: Left;  . CARDIAC CATHETERIZATION  04-05-2010   checking for blockage but none-[facility]  . COLONOSCOPY    . CYSTOSCOPY W/ STONE MANIPULATION     "laser once" (01/22/2013)  . HEMATOMA  EVACUATION Left 05/09/2017   Procedure: EVACUATION HEMATOMA LEFT ARM;  Surgeon: Conrad [place], MD;  Location: [facility];  Service: Vascular;  Laterality: Left;  . HERNIA REPAIR    . INGUINAL HERNIA REPAIR Right 11/06/2015   Procedure: OPEN HERNIA REPAIR  RIGHT INGUINAL ADULT;  Surgeon: [name], MD;  Location: [facility];  Service: General;  Laterality: Right;  with MESH  . INSERTION OF DIALYSIS CATHETER Right 10/05/2016   Procedure: INSERTION OF right internal jugular DIALYSIS CATHETER;  Surgeon: [name], MD;  Location: [facility];  Service: Vascular;  Laterality: Right;  . INSERTION OF MESH  03/20/2012   Procedure: INSERTION OF MESH;  UMB Surgeon: [name], MD;  Location: [facility];  Service: General;  Laterality: N/A;  . INSERTION OF MESH N/A 01/22/2013   Procedure: INSERTION OF MESH;  Surgeon: [name], MD;  Location: [facility];  Service: General;  Laterality: N/A;  . LAPAROTOMY  04/02/2012   Procedure: EXPLORATORY LAPAROTOMY;  Surgeon: [name], MD;  Location: [facility];  Service: General;  Laterality: N/A;  Exploratory Laparotomy with resection of small intestine  . LEFT HEART CATHETERIZATION WITH CORONARY ANGIOGRAM N/A 05/14/2013   Procedure: LEFT HEART CATHETERIZATION WITH CORONARY ANGIOGRAM;  Surgeon: Sinclair Grooms, MD;  [facility];  Service: Cardiovascular;  Laterality: N/A;  . LIGATION OF ARTERIOVENOUS  FISTULA Left 12/27/2016   Procedure: LIGATION/EXCISION OF LEFT UPPER ARM ARTERIOVENOUS  FISTULA;  EVACUATION OF HEMATOMA;  Surgeon: Conrad Delmont, MD;  Location: [facility];  Service: Vascular;  Laterality: Left;  . REVISON OF ARTERIOVENOUS FISTULA Left 10/05/2016   Procedure: REVISON OF left arm ARTERIOVENOUS FISTULA;  Surgeon: [name], MD;  Location: [facility];  Service: Vascular;  Laterality: Left;  . TONSILLECTOMY    . UMBILICAL HERNIA REPAIR  03/20/2012   Procedure: HERNIA REPAIR UMBILICAL ADULT;  Surgeon: [name], MD;  Location: [facility];  Service: General;   Laterality: N/A;  . UMBILICAL HERNIA REPAIR  01/22/2013   preperitoneal open procedure due to significant adhesions/notes 01/22/2013  . VENTRAL HERNIA REPAIR N/A 01/22/2013   Procedure: ATTEMPTED LAPAROSCOPIC VENTRAL HERNIA CONVERTED TO OPEN;  Surgeon: [name], MD;  [facility];  Service: General;  Laterality: N/A;    FAMILY HISTORY Family History  Problem Relation Age of Onset  . Heart disease Mother   . Hyperlipidemia Mother   . Hypertension Mother   . Kidney disease Father   . Stroke Father   . Kidney disease Brother   . Amblyopia Neg Hx   . Blindness Neg Hx   . Cataracts Neg Hx   . Diabetes Neg Hx   . Glaucoma Neg Hx   . Macular degeneration Neg Hx   . Retinal detachment Neg Hx   . Strabismus Neg Hx   . Retinitis pigmentosa Neg Hx     SOCIAL HISTORY Social History   Tobacco Use  . Smoking  status: Never Smoker  . Smokeless tobacco: Never Used  Substance Use Topics  . Alcohol use: No    Alcohol/week: 0.0 oz  . Drug use: No         OPHTHALMIC EXAM:   Not recorded      IMAGING AND PROCEDURES  Imaging and Procedures for 08/10/17           ASSESSMENT/PLAN:    ICD-10-CM   1. Acute anterior uveitis of right eye H20.00   2. Branch retinal vein occlusion of right eye with macular edema H34.8310   3. Retinal edema H35.81 OCT, Retina - OU - Both Eyes  4. Lattice degeneration of left retina H35.412   5. Atrophic retinal break, multiple, left eye H33.332   6. Combined forms of age-related cataract of both eyes H25.813    1. Acute Anterior Uveitis OD-  - symptoms improving - FA on 3.18.19 without posterior involvement, vasculitis or leakage OD - by history, this is a first episode, no prior episodes - since first episode, will defer work up for now - subjective improvement in symptoms of eye pain and redness.  - objective improvement in conj injection, corneal edema, AC cell and KPs - IOP OD ***, today - dec PF OD to 6x/daily while awake, -  continue cosopt OD BID - D/C cyclopentolate OD  - F/U 2 weeks  2, 3. BRVO w/ CME OD - s/p IVA OD #1 (12.21.18), #2 (01.18.19), #3 (02.14.19) -- 6 wk interval, #4 (04.02.19) - repeat OCT shows interval improvement in central CME tracking from sup nasal macula and interval improvement in IRF - F/U 7-8 weeks for repeat assessment of BRVO w/ CME OD ~5.21.19  4, 5. Lattice degeneration with atrophic holes OS-  - peripheral holes superiorly at 1030 without SRF or RD - S/P laser retinopexy OS (01.04.19) - good laser in place  6. Combined forms age-related cataract OU-  - The symptoms of cataract, surgical options, and treatments and risks were discussed with patient. - discussed diagnosis and progression - not yet visually significant - monitor for now   Ophthalmic Meds Ordered this visit:  No orders of the defined types were placed in this encounter.      No follow-ups on file.  There are no Patient Instructions on file for this visit.   Explained the diagnoses, plan, and follow up with the patient and they expressed understanding.  Patient expressed understanding of the importance of proper follow up care.   This document serves as a record of services personally performed by Gardiner Sleeper, MD, PhD. It was created on their behalf by Catha Brow, [place], a certified ophthalmic assistant. The creation of this record is the provider's dictation and/or activities during the visit.  Electronically signed by: Catha Brow, [place]  08/10/17 4:30 PM   Gardiner Sleeper, M.D., Ph.D. Diseases & Surgery of the Retina and [place] 08/10/17    Abbreviations: M myopia (nearsighted); A astigmatism; H hyperopia (farsighted); P presbyopia; Mrx spectacle prescription;  CTL contact lenses; OD right eye; OS left eye; OU both eyes  XT exotropia; ET esotropia; PEK punctate epithelial keratitis; PEE punctate epithelial erosions; DES dry eye syndrome; MGD  meibomian gland dysfunction; ATs artificial tears; PFAT's preservative free artificial tears; [place] nuclear sclerotic cataract; PSC posterior subcapsular cataract; ERM epi-retinal membrane; PVD posterior vitreous detachment; RD retinal detachment; DM diabetes mellitus; DR diabetic retinopathy; NPDR non-proliferative diabetic retinopathy; PDR proliferative diabetic retinopathy; CSME clinically significant macular edema;  DME diabetic macular edema; dbh dot blot hemorrhages; CWS cotton wool spot; POAG primary open angle glaucoma; C/D cup-to-disc ratio; HVF humphrey visual field; GVF goldmann visual field; OCT optical coherence tomography; IOP intraocular pressure; BRVO Branch retinal vein occlusion; CRVO central retinal vein occlusion; CRAO central retinal artery occlusion; BRAO branch retinal artery occlusion; RT retinal tear; SB scleral buckle; PPV pars plana vitrectomy; VH Vitreous hemorrhage; PRP panretinal laser photocoagulation; IVK intravitreal kenalog; VMT vitreomacular traction; MH Macular hole;  NVD neovascularization of the disc; NVE neovascularization elsewhere; AREDS age related eye disease study; ARMD age related macular degeneration; POAG primary open angle glaucoma; EBMD epithelial/anterior basement membrane dystrophy; ACIOL anterior chamber intraocular lens; IOL intraocular lens; PCIOL posterior chamber intraocular lens; Phaco/IOL phacoemulsification with intraocular lens placement; [place] photorefractive keratectomy; LASIK laser assisted in situ keratomileusis; HTN hypertension; DM diabetes mellitus; COPD chronic obstructive pulmonary disease

## 2017-08-11 DIAGNOSIS — N186 End stage renal disease: Secondary | ICD-10-CM | POA: Diagnosis not present

## 2017-08-11 DIAGNOSIS — N2581 Secondary hyperparathyroidism of renal origin: Secondary | ICD-10-CM | POA: Diagnosis not present

## 2017-08-14 ENCOUNTER — Encounter (INDEPENDENT_AMBULATORY_CARE_PROVIDER_SITE_OTHER): Payer: Medicare Other | Admitting: Ophthalmology

## 2017-08-14 DIAGNOSIS — N186 End stage renal disease: Secondary | ICD-10-CM | POA: Diagnosis not present

## 2017-08-14 DIAGNOSIS — N2581 Secondary hyperparathyroidism of renal origin: Secondary | ICD-10-CM | POA: Diagnosis not present

## 2017-08-16 DIAGNOSIS — N2581 Secondary hyperparathyroidism of renal origin: Secondary | ICD-10-CM | POA: Diagnosis not present

## 2017-08-16 DIAGNOSIS — N186 End stage renal disease: Secondary | ICD-10-CM | POA: Diagnosis not present

## 2017-08-17 DIAGNOSIS — N186 End stage renal disease: Secondary | ICD-10-CM | POA: Diagnosis not present

## 2017-08-17 DIAGNOSIS — N2581 Secondary hyperparathyroidism of renal origin: Secondary | ICD-10-CM | POA: Diagnosis not present

## 2017-08-18 DIAGNOSIS — N2581 Secondary hyperparathyroidism of renal origin: Secondary | ICD-10-CM | POA: Diagnosis not present

## 2017-08-18 DIAGNOSIS — N186 End stage renal disease: Secondary | ICD-10-CM | POA: Diagnosis not present

## 2017-08-21 DIAGNOSIS — N186 End stage renal disease: Secondary | ICD-10-CM | POA: Diagnosis not present

## 2017-08-21 DIAGNOSIS — N2581 Secondary hyperparathyroidism of renal origin: Secondary | ICD-10-CM | POA: Diagnosis not present

## 2017-08-23 DIAGNOSIS — N2581 Secondary hyperparathyroidism of renal origin: Secondary | ICD-10-CM | POA: Diagnosis not present

## 2017-08-23 DIAGNOSIS — N186 End stage renal disease: Secondary | ICD-10-CM | POA: Diagnosis not present

## 2017-08-25 DIAGNOSIS — N186 End stage renal disease: Secondary | ICD-10-CM | POA: Diagnosis not present

## 2017-08-25 DIAGNOSIS — N2581 Secondary hyperparathyroidism of renal origin: Secondary | ICD-10-CM | POA: Diagnosis not present

## 2017-08-28 DIAGNOSIS — N2581 Secondary hyperparathyroidism of renal origin: Secondary | ICD-10-CM | POA: Diagnosis not present

## 2017-08-28 DIAGNOSIS — N186 End stage renal disease: Secondary | ICD-10-CM | POA: Diagnosis not present

## 2017-08-30 DIAGNOSIS — Z992 Dependence on renal dialysis: Secondary | ICD-10-CM | POA: Diagnosis not present

## 2017-08-30 DIAGNOSIS — N186 End stage renal disease: Secondary | ICD-10-CM | POA: Diagnosis not present

## 2017-08-30 DIAGNOSIS — N2581 Secondary hyperparathyroidism of renal origin: Secondary | ICD-10-CM | POA: Diagnosis not present

## 2017-08-30 DIAGNOSIS — D631 Anemia in chronic kidney disease: Secondary | ICD-10-CM | POA: Diagnosis not present

## 2017-08-30 DIAGNOSIS — Q612 Polycystic kidney, adult type: Secondary | ICD-10-CM | POA: Diagnosis not present

## 2017-08-31 ENCOUNTER — Encounter (INDEPENDENT_AMBULATORY_CARE_PROVIDER_SITE_OTHER): Payer: Self-pay | Admitting: Ophthalmology

## 2017-08-31 ENCOUNTER — Ambulatory Visit (INDEPENDENT_AMBULATORY_CARE_PROVIDER_SITE_OTHER): Payer: Medicare Other | Admitting: Ophthalmology

## 2017-08-31 DIAGNOSIS — H25813 Combined forms of age-related cataract, bilateral: Secondary | ICD-10-CM

## 2017-08-31 DIAGNOSIS — H34832 Tributary (branch) retinal vein occlusion, left eye, with macular edema: Secondary | ICD-10-CM

## 2017-08-31 DIAGNOSIS — H33332 Multiple defects of retina without detachment, left eye: Secondary | ICD-10-CM

## 2017-08-31 DIAGNOSIS — H3581 Retinal edema: Secondary | ICD-10-CM

## 2017-08-31 DIAGNOSIS — H35412 Lattice degeneration of retina, left eye: Secondary | ICD-10-CM | POA: Diagnosis not present

## 2017-08-31 DIAGNOSIS — H2000 Unspecified acute and subacute iridocyclitis: Secondary | ICD-10-CM

## 2017-08-31 DIAGNOSIS — H34831 Tributary (branch) retinal vein occlusion, right eye, with macular edema: Secondary | ICD-10-CM

## 2017-08-31 MED ORDER — BEVACIZUMAB CHEMO INJECTION 1.25MG/0.05ML SYRINGE FOR KALEIDOSCOPE
1.2500 mg | INTRAVITREAL | Status: DC
  Administered 2017-08-31: 1.25 mg via INTRAVITREAL

## 2017-08-31 NOTE — Progress Notes (Signed)
[facility] Note  08/31/2017     CHIEF COMPLAINT Patient presents for Retina Follow Up   HISTORY OF PRESENT ILLNESS: Michael [name] is a 62 y.o. male who presents to the clinic today for:   HPI    Retina Follow Up    Patient presents with  CRVO/BRVO.  In right eye.  This started 5 months ago.  Severity is moderate.  Duration of 30 days.  Since onset it is stable.  I, the attending physician,  performed the HPI with the patient and updated documentation appropriately.          Comments    F/U BRVO w/ CME OD, acute anterior uveitis OD; Pt states OU VA is stable; Pt states OU are comfortable; Pt states he is only using PF OD TID; Pt denies floaters, denies flashes, denies wavy VA;        Last edited by [name], MD on 08/31/2017 11:58 AM. (History)     Referring physician: [name], MD [place], [place] [address]  HISTORICAL INFORMATION:   Selected notes from the MEDICAL RECORD NUMBER Referred by Dr. [name]. Hecker for concern of HRVO OD; LEE- 11.30.18 ([name]) {BCVA OD: 20/100-1 OS: 20/20-2] Ocular Hx- cataract OU PMH- HTN, high chol, kidney disease, sleep apnea, emphysema    CURRENT MEDICATIONS: Current Outpatient Medications (Ophthalmic Drugs)  Medication Sig  . prednisoLONE acetate (PRED FORTE) 1 % ophthalmic suspension Place 1 drop into the right eye every hour while awake.   No current facility-administered medications for this visit.  (Ophthalmic Drugs)   Current Outpatient Medications (Other)  Medication Sig  . aspirin EC 81 MG EC tablet Take 1 tablet (81 mg total) by mouth daily.  [name] atorvastatin (LIPITOR) 40 MG tablet Take 40 mg by mouth every evening.   . calcium acetate (PHOSLO) 667 MG capsule Take 1,334 mg by mouth 3 (three) times daily with meals.   . metoprolol succinate (TOPROL-XL) 100 MG 24 hr tablet Take 50 mg by mouth See admin instructions. Take 50 mg by mouth daily on Tuesday, Thursday and  Saturday.  . nitroGLYCERIN (NITROSTAT) 0.4 MG SL tablet Place 1 tablet (0.4 mg total) under the tongue every 5 (five) minutes x 3 doses as needed for chest pain.  [name] oxyCODONE-acetaminophen (ROXICET) 5-325 MG tablet Take 1 tablet by mouth every 6 (six) hours as needed.  . SENSIPAR 60 MG tablet Take 60 mg by mouth every evening.    Current Facility-Administered Medications (Other)  Medication Route  . Bevacizumab (AVASTIN) SOLN 1.25 mg Intravitreal  . Bevacizumab (AVASTIN) SOLN 1.25 mg Intravitreal  . Bevacizumab (AVASTIN) SOLN 1.25 mg Intravitreal  . Bevacizumab (AVASTIN) SOLN 1.25 mg Intravitreal  . Bevacizumab (AVASTIN) SOLN 1.25 mg Intravitreal      REVIEW OF SYSTEMS: ROS    Positive for: Genitourinary, Eyes   Negative for: Constitutional, Gastrointestinal, Neurological, Skin, Musculoskeletal, HENT, Endocrine, Cardiovascular, Respiratory, Psychiatric, Allergic/Imm, Heme/Lymph   Last edited by Cherrie Gauze, COA on 08/31/2017  8:10 AM. (History)       ALLERGIES No Known Allergies  PAST MEDICAL HISTORY Past Medical History:  Diagnosis Date  . Dyspnea    on exertion  . ESRD (end stage renal disease) ([place])    Hemo- MWF, Polycystic kidney disease  . Fatigue   . History of kidney stones    removal of stone- cysto  . Hyperlipidemia   . Hyperparathyroidism, secondary renal ([place])   . Hypertension   .  Hypoxemia 12/12/2013  . Nonischemic cardiomyopathy ([place])    Er 25% 2015, 55 % 2013  . OSA on CPAP    no longer using cpap  . OSA on CPAP 03/24/2014  . Pneumonia    2015ish  . Ventricular tachycardia//Freq PVCs    Past Surgical History:  Procedure Laterality Date  . A/V FISTULAGRAM Left 04/27/2017   Procedure: A/V FISTULAGRAM;  Surgeon: Conrad Slater, MD;  Location: [facility];  Service: Cardiovascular;  Laterality: Left;  lt arm  . APPENDECTOMY    . AV FISTULA PLACEMENT  12/05/2011   Procedure: ARTERIOVENOUS (AV) FISTULA CREATION;LLEFT ARM  Surgeon: Conrad Pineville,  MD;  Location: [facility];  Service: Vascular;  Laterality: Left;  RADIO-CEPHALIC  fistula left arm  . AV FISTULA PLACEMENT  01/11/2012   Procedure: ARTERIOVENOUS (AV) FISTULA CREATION;  Surgeon: Conrad [place] Hills, MD;  Location: [facility];  Service: Vascular;  Laterality: Left;  Creation of left brachial cephalic arteriovenous fistula  . BASCILIC VEIN TRANSPOSITION Left 12/27/2016   Procedure: BASILIC VEIN TRANSPOSITION LEFT UPPER ARM FIRST STAGE;  Surgeon: Conrad Dilkon, MD;  Location: [facility];  Service: Vascular;  Laterality: Left;  . BASCILIC VEIN TRANSPOSITION Left 01/31/2017   Procedure: LEFT ARM BASILIC VEIN TRANSPOSITION, SECOND STAGE;  Surgeon: Conrad Weston, MD;  Location: [facility];  Service: Vascular;  Laterality: Left;  . CARDIAC CATHETERIZATION  04-05-2010   checking for blockage but none-[facility]  . COLONOSCOPY    . CYSTOSCOPY W/ STONE MANIPULATION     "laser once" (01/22/2013)  . HEMATOMA EVACUATION Left 05/09/2017   Procedure: EVACUATION HEMATOMA LEFT ARM;  Surgeon: Conrad Weston, MD;  Location: [facility];  Service: Vascular;  Laterality: Left;  . HERNIA REPAIR    . INGUINAL HERNIA REPAIR Right 11/06/2015   Procedure: OPEN HERNIA REPAIR  RIGHT INGUINAL ADULT;  Surgeon: [name], MD;  Location: [facility];  Service: General;  Laterality: Right;  with MESH  . INSERTION OF DIALYSIS CATHETER Right 10/05/2016   Procedure: INSERTION OF right internal jugular DIALYSIS CATHETER;  Surgeon: [name], MD;  Location: [facility];  Service: Vascular;  Laterality: Right;  . INSERTION OF MESH  03/20/2012   Procedure: INSERTION OF MESH;  UMB Surgeon: [name], MD;  Location: [facility];  Service: General;  Laterality: N/A;  . INSERTION OF MESH N/A 01/22/2013   Procedure: INSERTION OF MESH;  Surgeon: [name], MD;  Location: [facility];  Service: General;  Laterality: N/A;  . LAPAROTOMY  04/02/2012   Procedure: EXPLORATORY LAPAROTOMY;  Surgeon: [name], MD;  Location: [facility];  Service: General;  Laterality: N/A;   Exploratory Laparotomy with resection of small intestine  . LEFT HEART CATHETERIZATION WITH CORONARY ANGIOGRAM N/A 05/14/2013   Procedure: LEFT HEART CATHETERIZATION WITH CORONARY ANGIOGRAM;  Surgeon: Sinclair Grooms, MD;  [facility];  Service: Cardiovascular;  Laterality: N/A;  . LIGATION OF ARTERIOVENOUS  FISTULA Left 12/27/2016   Procedure: LIGATION/EXCISION OF LEFT UPPER ARM ARTERIOVENOUS  FISTULA;  EVACUATION OF HEMATOMA;  Surgeon: Conrad , MD;  Location: [facility];  Service: Vascular;  Laterality: Left;  . REVISON OF ARTERIOVENOUS FISTULA Left 10/05/2016   Procedure: REVISON OF left arm ARTERIOVENOUS FISTULA;  Surgeon: [name], MD;  Location: [facility];  Service: Vascular;  Laterality: Left;  . TONSILLECTOMY    . UMBILICAL HERNIA REPAIR  03/20/2012   Procedure: HERNIA REPAIR UMBILICAL ADULT;  Surgeon: [name], MD;  Location: [facility];  Service: General;  Laterality: N/A;  . UMBILICAL HERNIA REPAIR  01/22/2013   preperitoneal open procedure due to significant adhesions/notes 01/22/2013  . VENTRAL HERNIA REPAIR N/A 01/22/2013   Procedure: ATTEMPTED LAPAROSCOPIC VENTRAL HERNIA CONVERTED TO OPEN;  Surgeon: [name], MD;  [facility];  Service: General;  Laterality: N/A;    FAMILY HISTORY Family History  Problem Relation Age of Onset  . Heart disease Mother   . Hyperlipidemia Mother   . Hypertension Mother   . Kidney disease Father   . Stroke Father   . Kidney disease Brother   . Amblyopia Neg Hx   . Blindness Neg Hx   . Cataracts Neg Hx   . Diabetes Neg Hx   . Glaucoma Neg Hx   . Macular degeneration Neg Hx   . Retinal detachment Neg Hx   . Strabismus Neg Hx   . Retinitis pigmentosa Neg Hx     SOCIAL HISTORY Social History   Tobacco Use  . Smoking status: Never Smoker  . Smokeless tobacco: Never Used  Substance Use Topics  . Alcohol use: No    Alcohol/week: 0.0 oz  . Drug use: No         OPHTHALMIC EXAM:  Base Eye Exam    Visual  Acuity (Snellen - Linear)      Right Left   Dist [place] 20/70 20/30   Dist ph [place] 20/40 20/20       Tonometry (Tonopen, 8:19 AM)      Right Left   Pressure 29 19       Tonometry #2 (Tonopen, 8:19 AM)      Right Left   Pressure 28        Pupils      Dark Light Shape React APD   Right 6 6 Round Minimal None   Left 4 4 Round Minimal None       Visual Fields (Counting fingers)      Left Right    Full Full       Extraocular Movement      Right Left    Full, Ortho Full, Ortho       Neuro/Psych    Oriented x3:  Yes   Mood/Affect:  Normal       Dilation    Both eyes:  1.0% Mydriacyl, 2.5% Phenylephrine @ 8:22 AM        Slit Lamp and Fundus Exam    Slit Lamp Exam      Right Left   Lids/Lashes Dermatochalasis - upper lid Dermatochalasis - upper lid   Conjunctiva/Sclera Mild Melanosis, Nasal Pinguecula Mild Melanosis   Cornea Arcus, scattered mild K haze - greatest inferiorly and nasally, residual KP, trace nasal microcystic edema - vastly improved, 2-3+ Guttata -- greatest temporally Arcus   Anterior Chamber Deep and quiet, no cell or flare Deep and quiet   Iris Round and dilated Round and dilated   Lens 2+ Nuclear sclerosis, 2+ Cortical cataract 2+ Nuclear sclerosis, 2+ Cortical cataract   Vitreous Vitreous syneresis, no cell Vitreous syneresis       Fundus Exam      Right Left   Disc vascular loops with hemorrhage superiorly Normal   C/D Ratio 0.4 0.5   Macula Mildly Blunted foveal reflex, persistent dark intraretinal hemorrhage SN to macula Flat, No heme or edema, Blunted foveal reflex   Vessels Vascular attenuation, dilated and Tortuous, Copper wiring superiorly Mild Copper wiring, mildly Tortuous   Periphery Attached, DBH superiorly and temporally  Attached, pigmented lattice with 3 atrophic holes at 1030 ora - good laser surrounding          IMAGING AND PROCEDURES  Imaging and Procedures for 08/31/17  OCT, Retina - OU - Both Eyes       Right Eye Quality  was good. Central Foveal Thickness: 263. Progression has worsened. Findings include normal foveal contour, outer retinal atrophy, intraretinal fluid, no SRF (Worse cystic changes).   Left Eye Quality was good. Central Foveal Thickness: 268. Progression has been stable. Findings include normal foveal contour, no IRF, no SRF, epiretinal membrane.   Notes *Images captured and stored on drive  Diagnosis / Impression:  OD: focal area of retinal edema/IRF SN to fovea -- persistent retinal edema and IRF -- slightly worse OS: trace ERM  Clinical management:  See below  Abbreviations: NFP - Normal foveal profile. CME - cystoid macular edema. PED - pigment epithelial detachment. IRF - intraretinal fluid. SRF - subretinal fluid. EZ - ellipsoid zone. ERM - epiretinal membrane. ORA - outer retinal atrophy. ORT - outer retinal tubulation. SRHM - subretinal hyper-reflective material         Intravitreal Injection, Pharmacologic Agent - OD - Right Eye       Time Out 08/31/2017. 9:02 AM. Confirmed correct patient, procedure, site, and patient consented.   Anesthesia Topical anesthesia was used. Anesthetic medications included Lidocaine 2%, Tetracaine 0.5%.   Procedure Preparation included 5% betadine to ocular surface, eyelid speculum. A supplied needle was used.   Injection: 1.25 mg Bevacizumab 1.25mg /0.67ml   NDC: 95638-756-43    Lot: [id number]     Expiration Date: 09/14/2017   Route: Intravitreal   Site: Right Eye   Waste: 0 mg  Post-op Post injection exam found visual acuity of at least counting fingers. The patient tolerated the procedure well. There were no complications. The patient received written and verbal post procedure care education.                 ASSESSMENT/PLAN:    ICD-10-CM   1. Acute anterior uveitis of right eye H20.00   2. Branch retinal vein occlusion of right eye with macular edema H34.8310 Intravitreal Injection, Pharmacologic Agent - OD - Right Eye     Bevacizumab (AVASTIN) SOLN 1.25 mg  3. Retinal edema H35.81 OCT, Retina - OU - Both Eyes  4. Lattice degeneration of left retina H35.412   5. Atrophic retinal break, multiple, left eye H33.332   6. Combined forms of age-related cataract of both eyes H25.813   7. Branch retinal vein occlusion of left eye with macular edema H34.8320    1. Acute Anterior Uveitis OD-  - symptoms improved - FA on 3.18.19 without posterior involvement, vasculitis or leakage OD - by history, this is a first episode, no prior episodes - improvement in conj injection, corneal edema, AC cell and KPs - IOP OD 29 today -- pt not taking cosopt, possible steroid response - dec PF OD to BID - re-start cosopt OD BID - updated drop instruction sheet given - F/U 4 weeks  2, 3. BRVO w/ CME OD - s/p IVA OD #1 (12.21.18), #2 (01.18.19), #3 (02.14.19), #4 (04.02.19) - OCT today with peristent IRF/cystic changes - recommend IVA #5 today 5.2.19 - F/U 4 weeks for repeat assessment of BRVO w/ CME OD ~05.30.19  4, 5. Lattice degeneration with atrophic holes OS-  - peripheral holes superiorly at 1030 without SRF or RD - S/P laser retinopexy OS (01.04.19) - good laser in place --  stable  6. Combined forms age-related cataract OU-  - The symptoms of cataract, surgical options, and treatments and risks were discussed with patient. - discussed diagnosis and progression - not yet visually significant - monitor for now   Ophthalmic Meds Ordered this visit:  Meds ordered this encounter  Medications  . Bevacizumab (AVASTIN) SOLN 1.25 mg       Return in about 1 month (around 09/28/2017) for F/U BRVO w/ CME OD, DFE, OCT.  There are no Patient Instructions on file for this visit.   Explained the diagnoses, plan, and follow up with the patient and they expressed understanding.  Patient expressed understanding of the importance of proper follow up care.   This document serves as a record of services personally performed by  Gardiner Sleeper, MD, PhD. It was created on their behalf by Catha Brow, [place], a certified ophthalmic assistant. The creation of this record is the provider's dictation and/or activities during the visit.  Electronically signed by: Catha Brow, [place]  08/31/17 10:03 PM   Gardiner Sleeper, M.D., Ph.D. Diseases & Surgery of the Retina and [place] 08/31/17  I have reviewed the above documentation for accuracy and completeness, and I agree with the above. Gardiner Sleeper, M.D., Ph.D. 08/31/17 10:03 PM    Abbreviations: M myopia (nearsighted); A astigmatism; H hyperopia (farsighted); P presbyopia; Mrx spectacle prescription;  CTL contact lenses; OD right eye; OS left eye; OU both eyes  XT exotropia; ET esotropia; PEK punctate epithelial keratitis; PEE punctate epithelial erosions; DES dry eye syndrome; MGD meibomian gland dysfunction; ATs artificial tears; PFAT's preservative free artificial tears; [place] nuclear sclerotic cataract; PSC posterior subcapsular cataract; ERM epi-retinal membrane; PVD posterior vitreous detachment; RD retinal detachment; DM diabetes mellitus; DR diabetic retinopathy; NPDR non-proliferative diabetic retinopathy; PDR proliferative diabetic retinopathy; CSME clinically significant macular edema; DME diabetic macular edema; dbh dot blot hemorrhages; CWS cotton wool spot; POAG primary open angle glaucoma; C/D cup-to-disc ratio; HVF humphrey visual field; GVF goldmann visual field; OCT optical coherence tomography; IOP intraocular pressure; BRVO Branch retinal vein occlusion; CRVO central retinal vein occlusion; CRAO central retinal artery occlusion; BRAO branch retinal artery occlusion; RT retinal tear; SB scleral buckle; PPV pars plana vitrectomy; VH Vitreous hemorrhage; PRP panretinal laser photocoagulation; IVK intravitreal kenalog; VMT vitreomacular traction; MH Macular hole;  NVD neovascularization of the disc; NVE neovascularization  elsewhere; AREDS age related eye disease study; ARMD age related macular degeneration; POAG primary open angle glaucoma; EBMD epithelial/anterior basement membrane dystrophy; ACIOL anterior chamber intraocular lens; IOL intraocular lens; PCIOL posterior chamber intraocular lens; Phaco/IOL phacoemulsification with intraocular lens placement; [place] photorefractive keratectomy; LASIK laser assisted in situ keratomileusis; HTN hypertension; DM diabetes mellitus; COPD chronic obstructive pulmonary disease

## 2017-09-01 DIAGNOSIS — N186 End stage renal disease: Secondary | ICD-10-CM | POA: Diagnosis not present

## 2017-09-01 DIAGNOSIS — N2581 Secondary hyperparathyroidism of renal origin: Secondary | ICD-10-CM | POA: Diagnosis not present

## 2017-09-01 DIAGNOSIS — D631 Anemia in chronic kidney disease: Secondary | ICD-10-CM | POA: Diagnosis not present

## 2017-09-04 DIAGNOSIS — N2581 Secondary hyperparathyroidism of renal origin: Secondary | ICD-10-CM | POA: Diagnosis not present

## 2017-09-04 DIAGNOSIS — N186 End stage renal disease: Secondary | ICD-10-CM | POA: Diagnosis not present

## 2017-09-04 DIAGNOSIS — D631 Anemia in chronic kidney disease: Secondary | ICD-10-CM | POA: Diagnosis not present

## 2017-09-08 DIAGNOSIS — D631 Anemia in chronic kidney disease: Secondary | ICD-10-CM | POA: Diagnosis not present

## 2017-09-08 DIAGNOSIS — N2581 Secondary hyperparathyroidism of renal origin: Secondary | ICD-10-CM | POA: Diagnosis not present

## 2017-09-08 DIAGNOSIS — N186 End stage renal disease: Secondary | ICD-10-CM | POA: Diagnosis not present

## 2017-09-11 DIAGNOSIS — D631 Anemia in chronic kidney disease: Secondary | ICD-10-CM | POA: Diagnosis not present

## 2017-09-11 DIAGNOSIS — N186 End stage renal disease: Secondary | ICD-10-CM | POA: Diagnosis not present

## 2017-09-11 DIAGNOSIS — N2581 Secondary hyperparathyroidism of renal origin: Secondary | ICD-10-CM | POA: Diagnosis not present

## 2017-09-13 DIAGNOSIS — N186 End stage renal disease: Secondary | ICD-10-CM | POA: Diagnosis not present

## 2017-09-13 DIAGNOSIS — D631 Anemia in chronic kidney disease: Secondary | ICD-10-CM | POA: Diagnosis not present

## 2017-09-13 DIAGNOSIS — N2581 Secondary hyperparathyroidism of renal origin: Secondary | ICD-10-CM | POA: Diagnosis not present

## 2017-09-15 DIAGNOSIS — N186 End stage renal disease: Secondary | ICD-10-CM | POA: Diagnosis not present

## 2017-09-15 DIAGNOSIS — D631 Anemia in chronic kidney disease: Secondary | ICD-10-CM | POA: Diagnosis not present

## 2017-09-15 DIAGNOSIS — N2581 Secondary hyperparathyroidism of renal origin: Secondary | ICD-10-CM | POA: Diagnosis not present

## 2017-09-18 DIAGNOSIS — N2581 Secondary hyperparathyroidism of renal origin: Secondary | ICD-10-CM | POA: Diagnosis not present

## 2017-09-18 DIAGNOSIS — N186 End stage renal disease: Secondary | ICD-10-CM | POA: Diagnosis not present

## 2017-09-18 DIAGNOSIS — D631 Anemia in chronic kidney disease: Secondary | ICD-10-CM | POA: Diagnosis not present

## 2017-09-20 DIAGNOSIS — N2581 Secondary hyperparathyroidism of renal origin: Secondary | ICD-10-CM | POA: Diagnosis not present

## 2017-09-20 DIAGNOSIS — N186 End stage renal disease: Secondary | ICD-10-CM | POA: Diagnosis not present

## 2017-09-20 DIAGNOSIS — D631 Anemia in chronic kidney disease: Secondary | ICD-10-CM | POA: Diagnosis not present

## 2017-09-22 DIAGNOSIS — N186 End stage renal disease: Secondary | ICD-10-CM | POA: Diagnosis not present

## 2017-09-22 DIAGNOSIS — N2581 Secondary hyperparathyroidism of renal origin: Secondary | ICD-10-CM | POA: Diagnosis not present

## 2017-09-22 DIAGNOSIS — D631 Anemia in chronic kidney disease: Secondary | ICD-10-CM | POA: Diagnosis not present

## 2017-09-26 DIAGNOSIS — D631 Anemia in chronic kidney disease: Secondary | ICD-10-CM | POA: Diagnosis not present

## 2017-09-26 DIAGNOSIS — N186 End stage renal disease: Secondary | ICD-10-CM | POA: Diagnosis not present

## 2017-09-26 DIAGNOSIS — N2581 Secondary hyperparathyroidism of renal origin: Secondary | ICD-10-CM | POA: Diagnosis not present

## 2017-09-29 DIAGNOSIS — N2581 Secondary hyperparathyroidism of renal origin: Secondary | ICD-10-CM | POA: Diagnosis not present

## 2017-09-29 DIAGNOSIS — N186 End stage renal disease: Secondary | ICD-10-CM | POA: Diagnosis not present

## 2017-09-29 DIAGNOSIS — D631 Anemia in chronic kidney disease: Secondary | ICD-10-CM | POA: Diagnosis not present

## 2017-09-30 DIAGNOSIS — Z992 Dependence on renal dialysis: Secondary | ICD-10-CM | POA: Diagnosis not present

## 2017-09-30 DIAGNOSIS — N186 End stage renal disease: Secondary | ICD-10-CM | POA: Diagnosis not present

## 2017-09-30 DIAGNOSIS — Q612 Polycystic kidney, adult type: Secondary | ICD-10-CM | POA: Diagnosis not present

## 2017-10-02 DIAGNOSIS — D631 Anemia in chronic kidney disease: Secondary | ICD-10-CM | POA: Diagnosis not present

## 2017-10-02 DIAGNOSIS — N186 End stage renal disease: Secondary | ICD-10-CM | POA: Diagnosis not present

## 2017-10-02 DIAGNOSIS — N2581 Secondary hyperparathyroidism of renal origin: Secondary | ICD-10-CM | POA: Diagnosis not present

## 2017-10-02 NOTE — Progress Notes (Addendum)
[facility] Note  10/03/2017     CHIEF COMPLAINT Patient presents for Retina Follow Up   HISTORY OF PRESENT ILLNESS: Michael [name] is a 62 y.o. male who presents to the clinic today for:   HPI    Retina Follow Up    Patient presents with  Other.  In right eye.  Severity is moderate.  Duration of 4 weeks.  Since onset it is stable.  I, the attending physician,  performed the HPI with the patient and updated documentation appropriately.          Comments    62 y/o male pt here for 4 wk f/u for acute anterior uveitis OD and BRVO w/CME OD.  No change in vision OU.  Sees intermittent temporal flashes OD.  Denies pain, floaters.  No gtts.       Last edited by [name], MD on 10/03/2017  1:58 PM. (History)    Pt states he has noticed flashing lights OU "out of the corners of my eyes"; Pt states he first noticed it 3 weeks ago and since has had intermittent flashing OU;   Referring physician: [name], MD [place], [place] [id number]  HISTORICAL INFORMATION:   Selected notes from the [facility] Referred by Dr. [name]. Hecker for concern of HRVO OD; LEE- 11.30.18 ([name]) {BCVA OD: 20/100-1 OS: 20/20-2] Ocular Hx- cataract OU PMH- HTN, high chol, kidney disease, sleep apnea, emphysema    CURRENT MEDICATIONS: Current Outpatient Medications (Ophthalmic Drugs)  Medication Sig  . dorzolamide-timolol (COSOPT) 22.3-6.8 MG/ML ophthalmic solution Place 1 drop into both eyes 2 (two) times daily.  . Ganciclovir (ZIRGAN) 0.15 % GEL Place 1 drop into the right eye 5 (five) times daily.  . prednisoLONE acetate (PRED FORTE) 1 % ophthalmic suspension Place 1 drop into the right eye every hour while awake.   No current facility-administered medications for this visit.  (Ophthalmic Drugs)   Current Outpatient Medications (Other)  Medication Sig  . acyclovir (ZOVIRAX) 400 MG tablet Take 1 tablet (400 mg total) by mouth 5  (five) times daily for 10 days.  [name] aspirin EC 81 MG EC tablet Take 1 tablet (81 mg total) by mouth daily.  [name] atorvastatin (LIPITOR) 40 MG tablet Take 40 mg by mouth every evening.   . calcium acetate (PHOSLO) 667 MG capsule Take 1,334 mg by mouth 3 (three) times daily with meals.   . metoprolol succinate (TOPROL-XL) 100 MG 24 hr tablet Take 50 mg by mouth See admin instructions. Take 50 mg by mouth daily on Tuesday, Thursday and Saturday.  . nitroGLYCERIN (NITROSTAT) 0.4 MG SL tablet Place 1 tablet (0.4 mg total) under the tongue every 5 (five) minutes x 3 doses as needed for chest pain.  [name] oxyCODONE-acetaminophen (ROXICET) 5-325 MG tablet Take 1 tablet by mouth every 6 (six) hours as needed.  . SENSIPAR 60 MG tablet Take 60 mg by mouth every evening.    Current Facility-Administered Medications (Other)  Medication Route  . Bevacizumab (AVASTIN) SOLN 1.25 mg Intravitreal  . Bevacizumab (AVASTIN) SOLN 1.25 mg Intravitreal  . Bevacizumab (AVASTIN) SOLN 1.25 mg Intravitreal  . Bevacizumab (AVASTIN) SOLN 1.25 mg Intravitreal  . Bevacizumab (AVASTIN) SOLN 1.25 mg Intravitreal      REVIEW OF SYSTEMS: ROS    Positive for: Eyes   Negative for: Constitutional, Gastrointestinal, Neurological, Skin, Genitourinary, Musculoskeletal, HENT, Endocrine, Cardiovascular, Respiratory, Psychiatric, Allergic/Imm, Heme/Lymph   Last edited by Renold Genta,  Judithann Graves, COA on 10/03/2017  1:47 PM. (History)       ALLERGIES No Known Allergies  PAST MEDICAL HISTORY Past Medical History:  Diagnosis Date  . Dyspnea    on exertion  . ESRD (end stage renal disease) ([place])    Hemo- MWF, Polycystic kidney disease  . Fatigue   . History of kidney stones    removal of stone- cysto  . Hyperlipidemia   . Hyperparathyroidism, secondary renal ([place])   . Hypertension   . Hypoxemia 12/12/2013  . Nonischemic cardiomyopathy ([place])    Er 25% 2015, 55 % 2013  . OSA on CPAP    no longer using cpap  . OSA on CPAP 03/24/2014  .  Pneumonia    2015ish  . Ventricular tachycardia//Freq PVCs    Past Surgical History:  Procedure Laterality Date  . A/V FISTULAGRAM Left 04/27/2017   Procedure: A/V FISTULAGRAM;  Surgeon: Conrad [place], MD;  Location: [facility];  Service: Cardiovascular;  Laterality: Left;  lt arm  . APPENDECTOMY    . AV FISTULA PLACEMENT  12/05/2011   Procedure: ARTERIOVENOUS (AV) FISTULA CREATION;LLEFT ARM  Surgeon: Conrad [place], MD;  Location: [facility];  Service: Vascular;  Laterality: Left;  RADIO-CEPHALIC  fistula left arm  . AV FISTULA PLACEMENT  01/11/2012   Procedure: ARTERIOVENOUS (AV) FISTULA CREATION;  Surgeon: Conrad [place], MD;  Location: [facility];  Service: Vascular;  Laterality: Left;  Creation of left brachial cephalic arteriovenous fistula  . BASCILIC VEIN TRANSPOSITION Left 12/27/2016   Procedure: BASILIC VEIN TRANSPOSITION LEFT UPPER ARM FIRST STAGE;  Surgeon: Conrad [place], MD;  Location: [facility];  Service: Vascular;  Laterality: Left;  . BASCILIC VEIN TRANSPOSITION Left 01/31/2017   Procedure: LEFT ARM BASILIC VEIN TRANSPOSITION, SECOND STAGE;  Surgeon: Conrad [place], MD;  Location: [facility];  Service: Vascular;  Laterality: Left;  . CARDIAC CATHETERIZATION  04-05-2010   checking for blockage but none-[facility]  . COLONOSCOPY    . CYSTOSCOPY W/ STONE MANIPULATION     "laser once" (01/22/2013)  . HEMATOMA EVACUATION Left 05/09/2017   Procedure: EVACUATION HEMATOMA LEFT ARM;  Surgeon: Conrad , MD;  Location: [facility];  Service: Vascular;  Laterality: Left;  . HERNIA REPAIR    . INGUINAL HERNIA REPAIR Right 11/06/2015   Procedure: OPEN HERNIA REPAIR  RIGHT INGUINAL ADULT;  Surgeon: [name], MD;  Location: [facility];  Service: General;  Laterality: Right;  with MESH  . INSERTION OF DIALYSIS CATHETER Right 10/05/2016   Procedure: INSERTION OF right internal jugular DIALYSIS CATHETER;  Surgeon: [name], MD;  Location: [facility];  Service: Vascular;  Laterality: Right;  . INSERTION OF MESH  03/20/2012    Procedure: INSERTION OF MESH;  UMB Surgeon: [name], MD;  Location: [facility];  Service: General;  Laterality: N/A;  . INSERTION OF MESH N/A 01/22/2013   Procedure: INSERTION OF MESH;  Surgeon: [name], MD;  Location: [facility];  Service: General;  Laterality: N/A;  . LAPAROTOMY  04/02/2012   Procedure: EXPLORATORY LAPAROTOMY;  Surgeon: [name], MD;  Location: [facility];  Service: General;  Laterality: N/A;  Exploratory Laparotomy with resection of small intestine  . LEFT HEART CATHETERIZATION WITH CORONARY ANGIOGRAM N/A 05/14/2013   Procedure: LEFT HEART CATHETERIZATION WITH CORONARY ANGIOGRAM;  Surgeon: Sinclair Grooms, MD;  Location: [facility];  Service: Cardiovascular;  Laterality: N/A;  . LIGATION OF ARTERIOVENOUS  FISTULA Left 12/27/2016   Procedure: LIGATION/EXCISION OF  LEFT UPPER ARM ARTERIOVENOUS  FISTULA;  EVACUATION OF HEMATOMA;  Surgeon: Conrad [place], MD;  Location: [facility];  Service: Vascular;  Laterality: Left;  . REVISON OF ARTERIOVENOUS FISTULA Left 10/05/2016   Procedure: REVISON OF left arm ARTERIOVENOUS FISTULA;  Surgeon: [name], MD;  Location: [facility];  Service: Vascular;  Laterality: Left;  . TONSILLECTOMY    . UMBILICAL HERNIA REPAIR  03/20/2012   Procedure: HERNIA REPAIR UMBILICAL ADULT;  Surgeon: [name], MD;  Location: [facility];  Service: General;  Laterality: N/A;  . UMBILICAL HERNIA REPAIR  01/22/2013   preperitoneal open procedure due to significant adhesions/notes 01/22/2013  . VENTRAL HERNIA REPAIR N/A 01/22/2013   Procedure: ATTEMPTED LAPAROSCOPIC VENTRAL HERNIA CONVERTED TO OPEN;  Surgeon: [name], MD;  [facility]: [facility];  Service: General;  Laterality: N/A;    FAMILY HISTORY Family History  Problem Relation Age of Onset  . Heart disease Mother   . Hyperlipidemia Mother   . Hypertension Mother   . Kidney disease Father   . Stroke Father   . Kidney disease Brother   . Amblyopia Neg Hx   . Blindness Neg Hx   . Cataracts  Neg Hx   . Diabetes Neg Hx   . Glaucoma Neg Hx   . Macular degeneration Neg Hx   . Retinal detachment Neg Hx   . Strabismus Neg Hx   . Retinitis pigmentosa Neg Hx     SOCIAL HISTORY Social History   Tobacco Use  . Smoking status: Never Smoker  . Smokeless tobacco: Never Used  Substance Use Topics  . Alcohol use: No    Alcohol/week: 0.0 oz  . Drug use: No         OPHTHALMIC EXAM:  Base Eye Exam    Visual Acuity (Snellen - Linear)      Right Left   Dist [place] 20/60 20/30 -2   Dist ph [place] 20/40 - 20/25 +       Tonometry (Tonopen, 1:49 PM)      Right Left   Pressure 23 27       Pupils      Dark Light Shape React APD   Right 5 5 Round Minimal None   Left 5  Round Minimal None       Visual Fields (Counting fingers)      Left Right    Full Full       Extraocular Movement      Right Left    Full, Ortho Full, Ortho       Neuro/Psych    Oriented x3:  Yes   Mood/Affect:  Normal       Dilation    Both eyes:  1.0% Mydriacyl, 2.5% Phenylephrine @ 1:49 PM        Slit Lamp and Fundus Exam    Slit Lamp Exam      Right Left   Lids/Lashes Dermatochalasis - upper lid Dermatochalasis - upper lid   Conjunctiva/Sclera Mild Melanosis, Nasal Pinguecula Mild Melanosis   Cornea Arcus, scattered mild K haze - greatest inferiorly and nasally, residual KP, 2-3+ Guttata -- greatest temporally, 2.66mm Dendrite just temporal to center Arcus   Anterior Chamber Deep and quiet, no cell or flare Deep and quiet   Iris Round and dilated Round and dilated   Lens 2+ Nuclear sclerosis, 2+ Cortical cataract 2+ Nuclear sclerosis, 2+ Cortical cataract   Vitreous Vitreous syneresis, no cell, Posterior vitreous detachment Vitreous syneresis, Posterior vitreous detachment  Fundus Exam      Right Left   Disc vascular loops with hemorrhage superiorly Normal   C/D Ratio 0.4 0.5   Macula Blunted foveal reflex, persistent dark intraretinal hemorrhage SN to macula, persistent cystic changes  Flat, No heme or edema, Blunted foveal reflex   Vessels Vascular attenuation, dilated and Tortuous, Copper wiring superiorly Mild Copper wiring, mildly Tortuous   Periphery Attached, DBH superiorly and temporally, pigmented Chorioretinal scar at 1030 Attached, pigmented lattice with 3 atrophic holes at 1030 ora - good laser surrounding          IMAGING AND PROCEDURES  Imaging and Procedures for 08/31/17  OCT, Retina - OU - Both Eyes       Right Eye Quality was good. Central Foveal Thickness: 259. Progression has been stable. Findings include normal foveal contour, outer retinal atrophy, intraretinal fluid, no SRF (stable cystic changes).   Left Eye Quality was good. Central Foveal Thickness: 268. Progression has been stable. Findings include normal foveal contour, no IRF, no SRF, epiretinal membrane.   Notes *Images captured and stored on drive  Diagnosis / Impression:  OD: focal area of retinal edema/IRF SN to fovea -- persistent retinal edema and IRF -- stable to slightly improved OS: trace ERM  Clinical management:  See below  Abbreviations: NFP - Normal foveal profile. CME - cystoid macular edema. PED - pigment epithelial detachment. IRF - intraretinal fluid. SRF - subretinal fluid. EZ - ellipsoid zone. ERM - epiretinal membrane. ORA - outer retinal atrophy. ORT - outer retinal tubulation. SRHM - subretinal hyper-reflective material                  ASSESSMENT/PLAN:    ICD-10-CM   1. Herpes keratitis B00.52   2. Branch retinal vein occlusion of right eye with macular edema H34.8310   3. Retinal edema H35.81 OCT, Retina - OU - Both Eyes  4. Lattice degeneration of left retina H35.412   5. Atrophic retinal break, multiple, left eye H33.332   6. Posterior vitreous detachment of both eyes H43.813   7. Combined forms of age-related cataract of both eyes H25.813   8. Branch retinal vein occlusion of left eye with macular edema H34.8320    1. Dendritic herpes  keratitis OD - likely etiology of prior episodes of Anterior Uveitis OD- now improved  - symptoms improved from anterior uveitis on PF, but pt ran out of PF drops 2 days ago - FA on 3.18.19 without posterior involvement, vasculitis or leakage OD - improvement in conj injection, corneal edema, AC cell and KPs - IOP OD 23 today -- pt not taking cosopt, ran out 2 days ago - DC PF OD - re-start cosopt OD BID - start trifluridine gtts 9x/day OD and po acyclovir, 400 mg 5x/day - updated drop instruction sheet given - F/U 1 week  2, 3. BRVO w/ CME OD - s/p IVA OD #1 (12.21.18), #2 (01.18.19), #3 (02.14.19), #4 (04.02.19), #5 (05.02.19) - OCT today with peristent IRF/cystic changes - will hold off on injection today due to single dendrite / herpes keratitis  4, 5. Lattice degeneration with atrophic holes OS-  - peripheral holes superiorly at 1030 without SRF or RD - S/P laser retinopexy OS (01.04.19) - good laser in place -- stable  6. PVD / vitreous syneresis OU  Discussed findings and prognosis  No RT or RD on 360 scleral depressed exam  Reviewed s/s of RT/RD  Strict return precautions for any such RT/RD signs/symptoms  7. Combined  forms age-related cataract OU-  - The symptoms of cataract, surgical options, and treatments and risks were discussed with patient. - discussed diagnosis and progression - not yet visually significant - monitor for now   Ophthalmic Meds Ordered this visit:  Meds ordered this encounter  Medications  . Ganciclovir (ZIRGAN) 0.15 % GEL    Sig: Place 1 drop into the right eye 5 (five) times daily.    Dispense:  5 g    Refill:  1  . acyclovir (ZOVIRAX) 400 MG tablet    Sig: Take 1 tablet (400 mg total) by mouth 5 (five) times daily for 10 days.    Dispense:  50 tablet    Refill:  0  . dorzolamide-timolol (COSOPT) 22.3-6.8 MG/ML ophthalmic solution    Sig: Place 1 drop into both eyes 2 (two) times daily.    Dispense:  10 mL    Refill:  3        Return in about 1 week (around 10/10/2017).  There are no Patient Instructions on file for this visit.   Explained the diagnoses, plan, and follow up with the patient and they expressed understanding.  Patient expressed understanding of the importance of proper follow up care.   This document serves as a record of services personally performed by Gardiner Sleeper, MD, PhD. It was created on their behalf by Catha Brow, [place], a certified ophthalmic assistant. The creation of this record is the provider's dictation and/or activities during the visit.  Electronically signed by: Catha Brow, COA  06.03.19 8:33 AM   Gardiner Sleeper, M.D., Ph.D. Diseases & Surgery of the Retina and Vitreous [facility]  I have reviewed the above documentation for accuracy and completeness, and I agree with the above. Gardiner Sleeper, M.D., Ph.D. 10/04/17 11:02 PM    Abbreviations: M myopia (nearsighted); A astigmatism; H hyperopia (farsighted); P presbyopia; Mrx spectacle prescription;  CTL contact lenses; OD right eye; OS left eye; OU both eyes  XT exotropia; ET esotropia; PEK punctate epithelial keratitis; PEE punctate epithelial erosions; DES dry eye syndrome; MGD meibomian gland dysfunction; ATs artificial tears; PFAT's preservative free artificial tears; [place] nuclear sclerotic cataract; PSC posterior subcapsular cataract; ERM epi-retinal membrane; PVD posterior vitreous detachment; RD retinal detachment; DM diabetes mellitus; DR diabetic retinopathy; NPDR non-proliferative diabetic retinopathy; PDR proliferative diabetic retinopathy; CSME clinically significant macular edema; DME diabetic macular edema; dbh dot blot hemorrhages; CWS cotton wool spot; POAG primary open angle glaucoma; C/D cup-to-disc ratio; HVF humphrey visual field; GVF goldmann visual field; OCT optical coherence tomography; IOP intraocular pressure; BRVO Branch retinal vein occlusion; CRVO central retinal vein  occlusion; CRAO central retinal artery occlusion; BRAO branch retinal artery occlusion; RT retinal tear; SB scleral buckle; PPV pars plana vitrectomy; VH Vitreous hemorrhage; PRP panretinal laser photocoagulation; IVK intravitreal kenalog; VMT vitreomacular traction; MH Macular hole;  NVD neovascularization of the disc; NVE neovascularization elsewhere; AREDS age related eye disease study; ARMD age related macular degeneration; POAG primary open angle glaucoma; EBMD epithelial/anterior basement membrane dystrophy; ACIOL anterior chamber intraocular lens; IOL intraocular lens; PCIOL posterior chamber intraocular lens; Phaco/IOL phacoemulsification with intraocular lens placement; [place] photorefractive keratectomy; LASIK laser assisted in situ keratomileusis; HTN hypertension; DM diabetes mellitus; COPD chronic obstructive pulmonary disease

## 2017-10-03 ENCOUNTER — Ambulatory Visit (INDEPENDENT_AMBULATORY_CARE_PROVIDER_SITE_OTHER): Payer: Medicare Other | Admitting: Ophthalmology

## 2017-10-03 ENCOUNTER — Encounter (INDEPENDENT_AMBULATORY_CARE_PROVIDER_SITE_OTHER): Payer: Self-pay | Admitting: Ophthalmology

## 2017-10-03 DIAGNOSIS — H25813 Combined forms of age-related cataract, bilateral: Secondary | ICD-10-CM | POA: Diagnosis not present

## 2017-10-03 DIAGNOSIS — H33332 Multiple defects of retina without detachment, left eye: Secondary | ICD-10-CM | POA: Diagnosis not present

## 2017-10-03 DIAGNOSIS — H35412 Lattice degeneration of retina, left eye: Secondary | ICD-10-CM | POA: Diagnosis not present

## 2017-10-03 DIAGNOSIS — H3581 Retinal edema: Secondary | ICD-10-CM | POA: Diagnosis not present

## 2017-10-03 DIAGNOSIS — H43813 Vitreous degeneration, bilateral: Secondary | ICD-10-CM | POA: Diagnosis not present

## 2017-10-03 DIAGNOSIS — H34832 Tributary (branch) retinal vein occlusion, left eye, with macular edema: Secondary | ICD-10-CM | POA: Diagnosis not present

## 2017-10-03 DIAGNOSIS — B0052 Herpesviral keratitis: Secondary | ICD-10-CM | POA: Diagnosis not present

## 2017-10-03 DIAGNOSIS — H34831 Tributary (branch) retinal vein occlusion, right eye, with macular edema: Secondary | ICD-10-CM

## 2017-10-03 MED ORDER — GANCICLOVIR 0.15 % OP GEL: 1.0000 [drp] | Freq: Every day | OPHTHALMIC | 1 refills | Status: DC

## 2017-10-03 MED ORDER — DORZOLAMIDE HCL-TIMOLOL MAL 2-0.5 % OP SOLN: 1.0000 [drp] | Freq: Two times a day (BID) | OPHTHALMIC | 3 refills | Status: DC

## 2017-10-03 MED ORDER — ACYCLOVIR 400 MG PO TABS: 400.0000 mg | ORAL_TABLET | Freq: Every day | ORAL | 0 refills | Status: AC

## 2017-10-04 DIAGNOSIS — D631 Anemia in chronic kidney disease: Secondary | ICD-10-CM | POA: Diagnosis not present

## 2017-10-04 DIAGNOSIS — N186 End stage renal disease: Secondary | ICD-10-CM | POA: Diagnosis not present

## 2017-10-04 DIAGNOSIS — N2581 Secondary hyperparathyroidism of renal origin: Secondary | ICD-10-CM | POA: Diagnosis not present

## 2017-10-06 DIAGNOSIS — N186 End stage renal disease: Secondary | ICD-10-CM | POA: Diagnosis not present

## 2017-10-06 DIAGNOSIS — N2581 Secondary hyperparathyroidism of renal origin: Secondary | ICD-10-CM | POA: Diagnosis not present

## 2017-10-06 DIAGNOSIS — D631 Anemia in chronic kidney disease: Secondary | ICD-10-CM | POA: Diagnosis not present

## 2017-10-09 DIAGNOSIS — N2581 Secondary hyperparathyroidism of renal origin: Secondary | ICD-10-CM | POA: Diagnosis not present

## 2017-10-09 DIAGNOSIS — N186 End stage renal disease: Secondary | ICD-10-CM | POA: Diagnosis not present

## 2017-10-09 DIAGNOSIS — D631 Anemia in chronic kidney disease: Secondary | ICD-10-CM | POA: Diagnosis not present

## 2017-10-10 ENCOUNTER — Encounter (INDEPENDENT_AMBULATORY_CARE_PROVIDER_SITE_OTHER): Payer: Medicare Other | Admitting: Ophthalmology

## 2017-10-11 DIAGNOSIS — D631 Anemia in chronic kidney disease: Secondary | ICD-10-CM | POA: Diagnosis not present

## 2017-10-11 DIAGNOSIS — N2581 Secondary hyperparathyroidism of renal origin: Secondary | ICD-10-CM | POA: Diagnosis not present

## 2017-10-11 DIAGNOSIS — N186 End stage renal disease: Secondary | ICD-10-CM | POA: Diagnosis not present

## 2017-10-12 NOTE — Progress Notes (Signed)
[facility] Note  10/13/2017     CHIEF COMPLAINT Patient presents for Retina Follow Up   HISTORY OF PRESENT ILLNESS: Michael [name] is a 62 y.o. male who presents to the clinic today for:   HPI    Retina Follow Up    Patient presents with  Other.  In right eye.  This started 6 months ago.  Severity is mild.  Since onset it is stable.  I, the attending physician,  performed the HPI with the patient and updated documentation appropriately.          Comments    F/U Acute ant. Uveitis OD/BRVO w/CME OD. Patient states the flashes are "very seldom now" , his vision is about the same , no new visual onset.Patient is ready for tx if indicated.       Last edited by [name], MD on 10/13/2017  1:38 PM. (History)      Referring physician: [name], MD [place], [place] [address]  HISTORICAL INFORMATION:   Selected notes from the MEDICAL RECORD NUMBER Referred by Dr. [name]. Hecker for concern of HRVO OD; LEE- 11.30.18 ([name]) {BCVA OD: 20/100-1 OS: 20/20-2] Ocular Hx- cataract OU PMH- HTN, high chol, kidney disease, sleep apnea, emphysema    CURRENT MEDICATIONS: Current Outpatient Medications (Ophthalmic Drugs)  Medication Sig  . dorzolamide-timolol (COSOPT) 22.3-6.8 MG/ML ophthalmic solution Place 1 drop into both eyes 2 (two) times daily.  . Ganciclovir (ZIRGAN) 0.15 % GEL Place 1 drop into the right eye 5 (five) times daily.  . prednisoLONE acetate (PRED FORTE) 1 % ophthalmic suspension Place 1 drop into the right eye every hour while awake.   No current facility-administered medications for this visit.  (Ophthalmic Drugs)   Current Outpatient Medications (Other)  Medication Sig  . aspirin EC 81 MG EC tablet Take 1 tablet (81 mg total) by mouth daily.  [name] atorvastatin (LIPITOR) 40 MG tablet Take 40 mg by mouth every evening.   . calcium acetate (PHOSLO) 667 MG capsule Take 1,334 mg by mouth 3 (three) times daily  with meals.   . metoprolol succinate (TOPROL-XL) 100 MG 24 hr tablet Take 50 mg by mouth See admin instructions. Take 50 mg by mouth daily on Tuesday, Thursday and Saturday.  . nitroGLYCERIN (NITROSTAT) 0.4 MG SL tablet Place 1 tablet (0.4 mg total) under the tongue every 5 (five) minutes x 3 doses as needed for chest pain.  [name] oxyCODONE-acetaminophen (ROXICET) 5-325 MG tablet Take 1 tablet by mouth every 6 (six) hours as needed.  . SENSIPAR 60 MG tablet Take 60 mg by mouth every evening.    Current Facility-Administered Medications (Other)  Medication Route  . Bevacizumab (AVASTIN) SOLN 1.25 mg Intravitreal  . Bevacizumab (AVASTIN) SOLN 1.25 mg Intravitreal  . Bevacizumab (AVASTIN) SOLN 1.25 mg Intravitreal  . Bevacizumab (AVASTIN) SOLN 1.25 mg Intravitreal  . Bevacizumab (AVASTIN) SOLN 1.25 mg Intravitreal      REVIEW OF SYSTEMS: ROS    Positive for: Eyes   Negative for: Constitutional, Gastrointestinal, Neurological, Skin, Genitourinary, Musculoskeletal, HENT, Endocrine, Cardiovascular, Respiratory, Psychiatric, Allergic/Imm, Heme/Lymph   Last edited by Zenovia Jordan, LPN on 3/76/2831  5:17 PM. (History)       ALLERGIES No Known Allergies  PAST MEDICAL HISTORY Past Medical History:  Diagnosis Date  . Dyspnea    on exertion  . ESRD (end stage renal disease) ([place])    Hemo- MWF, Polycystic kidney disease  . Fatigue   .  History of kidney stones    removal of stone- cysto  . Hyperlipidemia   . Hyperparathyroidism, secondary renal ([place])   . Hypertension   . Hypoxemia 12/12/2013  . Nonischemic cardiomyopathy ([place])    Er 25% 2015, 55 % 2013  . OSA on CPAP    no longer using cpap  . OSA on CPAP 03/24/2014  . Pneumonia    2015ish  . Ventricular tachycardia//Freq PVCs    Past Surgical History:  Procedure Laterality Date  . A/V FISTULAGRAM Left 04/27/2017   Procedure: A/V FISTULAGRAM;  Surgeon: Conrad Kief, MD;  Location: [facility];  Service: Cardiovascular;   Laterality: Left;  lt arm  . APPENDECTOMY    . AV FISTULA PLACEMENT  12/05/2011   Procedure: ARTERIOVENOUS (AV) FISTULA CREATION;LLEFT ARM  Surgeon: Conrad Hellertown, MD;  Location: [facility];  Service: Vascular;  Laterality: Left;  RADIO-CEPHALIC  fistula left arm  . AV FISTULA PLACEMENT  01/11/2012   Procedure: ARTERIOVENOUS (AV) FISTULA CREATION;  Surgeon: Conrad Ruth, MD;  Location: [facility];  Service: Vascular;  Laterality: Left;  Creation of left brachial cephalic arteriovenous fistula  . BASCILIC VEIN TRANSPOSITION Left 12/27/2016   Procedure: BASILIC VEIN TRANSPOSITION LEFT UPPER ARM FIRST STAGE;  Surgeon: Conrad [place]ville, MD;  Location: [facility];  Service: Vascular;  Laterality: Left;  . BASCILIC VEIN TRANSPOSITION Left 01/31/2017   Procedure: LEFT ARM BASILIC VEIN TRANSPOSITION, SECOND STAGE;  Surgeon: Conrad Bethania, MD;  Location: [facility];  Service: Vascular;  Laterality: Left;  . CARDIAC CATHETERIZATION  04-05-2010   checking for blockage but none-[facility]  . COLONOSCOPY    . CYSTOSCOPY W/ STONE MANIPULATION     "laser once" (01/22/2013)  . HEMATOMA EVACUATION Left 05/09/2017   Procedure: EVACUATION HEMATOMA LEFT ARM;  Surgeon: Conrad Glen Raven, MD;  Location: [place];  Service: Vascular;  Laterality: Left;  . HERNIA REPAIR    . INGUINAL HERNIA REPAIR Right 11/06/2015   Procedure: OPEN HERNIA REPAIR  RIGHT INGUINAL ADULT;  Surgeon: [name], MD;  Location: [facility];  Service: General;  Laterality: Right;  with MESH  . INSERTION OF DIALYSIS CATHETER Right 10/05/2016   Procedure: INSERTION OF right internal jugular DIALYSIS CATHETER;  Surgeon: [name], MD;  Location: [facility];  Service: Vascular;  Laterality: Right;  . INSERTION OF MESH  03/20/2012   Procedure: INSERTION OF MESH;  UMB Surgeon: [name], MD;  Location: [facility];  Service: General;  Laterality: N/A;  . INSERTION OF MESH N/A 01/22/2013   Procedure: INSERTION OF MESH;  Surgeon: [name], MD;  Location: [facility];  Service: General;   Laterality: N/A;  . LAPAROTOMY  04/02/2012   Procedure: EXPLORATORY LAPAROTOMY;  Surgeon: [name], MD;  Location: [facility];  Service: General;  Laterality: N/A;  Exploratory Laparotomy with resection of small intestine  . LEFT HEART CATHETERIZATION WITH CORONARY ANGIOGRAM N/A 05/14/2013   Procedure: LEFT HEART CATHETERIZATION WITH CORONARY ANGIOGRAM;  Surgeon: Sinclair Grooms, MD;  Location: [facility];  Service: Cardiovascular;  Laterality: N/A;  . LIGATION OF ARTERIOVENOUS  FISTULA Left 12/27/2016   Procedure: LIGATION/EXCISION OF LEFT UPPER ARM ARTERIOVENOUS  FISTULA;  EVACUATION OF HEMATOMA;  Surgeon: Conrad Haines, MD;  Location: [facility];  Service: Vascular;  Laterality: Left;  . REVISON OF ARTERIOVENOUS FISTULA Left 10/05/2016   Procedure: REVISON OF left arm ARTERIOVENOUS FISTULA;  Surgeon: [name], MD;  Location: [facility];  Service: Vascular;  Laterality: Left;  .  TONSILLECTOMY    . UMBILICAL HERNIA REPAIR  03/20/2012   Procedure: HERNIA REPAIR UMBILICAL ADULT;  Surgeon: [name], MD;  Location: [facility];  Service: General;  Laterality: N/A;  . UMBILICAL HERNIA REPAIR  01/22/2013   preperitoneal open procedure due to significant adhesions/notes 01/22/2013  . VENTRAL HERNIA REPAIR N/A 01/22/2013   Procedure: ATTEMPTED LAPAROSCOPIC VENTRAL HERNIA CONVERTED TO OPEN;  Surgeon: [name], MD;  [facility];  Service: General;  Laterality: N/A;    FAMILY HISTORY Family History  Problem Relation Age of Onset  . Heart disease Mother   . Hyperlipidemia Mother   . Hypertension Mother   . Kidney disease Father   . Stroke Father   . Kidney disease Brother   . Amblyopia Neg Hx   . Blindness Neg Hx   . Cataracts Neg Hx   . Diabetes Neg Hx   . Glaucoma Neg Hx   . Macular degeneration Neg Hx   . Retinal detachment Neg Hx   . Strabismus Neg Hx   . Retinitis pigmentosa Neg Hx     SOCIAL HISTORY Social History   Tobacco Use  . Smoking status: Never Smoker  .  Smokeless tobacco: Never Used  Substance Use Topics  . Alcohol use: No    Alcohol/week: 0.0 oz  . Drug use: No         OPHTHALMIC EXAM:  Base Eye Exam    Visual Acuity (Snellen - Linear)      Right Left   Dist [place] 20/50 +1 20/25   Dist ph [place] 20/40 NI       Tonometry (Tonopen, 1:41 PM)      Right Left   Pressure 17 18       Pupils      Dark Light Shape React APD   Right 5 4 Round Minimal None   Left 5  Round Minimal        Visual Fields (Counting fingers)      Left Right    Full Full       Extraocular Movement      Right Left    Full, Ortho Full, Ortho       Neuro/Psych    Oriented x3:  Yes   Mood/Affect:  Normal       Dilation    Both eyes:  1.0% Mydriacyl, Paremyd @ 1:41 PM        Slit Lamp and Fundus Exam    Slit Lamp Exam      Right Left   Lids/Lashes Dermatochalasis - upper lid Dermatochalasis - upper lid   Conjunctiva/Sclera Mild Melanosis, Nasal Pinguecula Mild Melanosis   Cornea Arcus, 2-3+ Guttata -- greatest temporally, 2.67mm Dendrite just temporal to center -- resolved, 2+ Punctate epithelial erosions, 1-2+ K haze, 1+ Descemet's folds Arcus   Anterior Chamber Deep and quiet, no cell or flare Deep and quiet   Iris Round and dilated Round and dilated   Lens 2+ Nuclear sclerosis, 2+ Cortical cataract 2+ Nuclear sclerosis, 2+ Cortical cataract   Vitreous Vitreous syneresis, no cell, +Posterior vitreous detachment Vitreous syneresis, Posterior vitreous detachment       Fundus Exam      Right Left   Disc vascular loops with hemorrhage superiorly Normal   C/D Ratio 0.4 0.5   Macula Blunted foveal reflex, persistent dark intraretinal hemorrhage SN to macula, persistent cystic changes Flat, No heme or edema, Blunted foveal reflex   Vessels Vascular attenuation, dilated and Tortuous, Copper wiring superiorly Mild  Copper wiring, mildly Tortuous   Periphery Attached, DBH superiorly and temporally, pigmented Chorioretinal scar at 1030 Attached,  pigmented lattice with 3 atrophic holes at 1030 ora - good laser surrounding          IMAGING AND PROCEDURES  Imaging and Procedures for 08/31/17  OCT, Retina - OU - Both Eyes       Right Eye Quality was good. Central Foveal Thickness: 257. Progression has been stable. Findings include normal foveal contour, outer retinal atrophy, intraretinal fluid, no SRF (stable cystic changes).   Left Eye Quality was good. Central Foveal Thickness: 268. Progression has been stable. Findings include normal foveal contour, no IRF, no SRF, epiretinal membrane.   Notes *Images captured and stored on drive  Diagnosis / Impression:  OD: focal area of retinal edema/IRF SN to fovea -- persistent retinal edema and IRF -- stable OS: trace ERM  Clinical management:  See below  Abbreviations: NFP - Normal foveal profile. CME - cystoid macular edema. PED - pigment epithelial detachment. IRF - intraretinal fluid. SRF - subretinal fluid. EZ - ellipsoid zone. ERM - epiretinal membrane. ORA - outer retinal atrophy. ORT - outer retinal tubulation. SRHM - subretinal hyper-reflective material                  ASSESSMENT/PLAN:    ICD-10-CM   1. Herpes keratitis B00.52 CANCELED: Intravitreal Injection, Pharmacologic Agent - OD - Right Eye  2. Branch retinal vein occlusion of right eye with macular edema H34.8310   3. Retinal edema H35.81 OCT, Retina - OU - Both Eyes  4. Lattice degeneration of left retina H35.412   5. Atrophic retinal break, multiple, left eye H33.332   6. Posterior vitreous detachment of both eyes H43.813   7. Combined forms of age-related cataract of both eyes H25.813   8. Branch retinal vein occlusion of left eye with macular edema H34.8320   9. Acute anterior uveitis of right eye H20.00    1. Dendritic herpes keratitis OD - likely etiology of prior episodes of Anterior Uveitis OD- now improved (stopped PF)  - FA on 3.18.19 without posterior involvement, vasculitis or leakage  OD - improvement in conj injection, corneal edema, AC cell and KPs - IOP OD 17 today -- on Cosopt BID OD - epithelial dendrite resolved today (on po acyclovir and topical trifluridine [ganciclovir gel $175]), but still with some corneal haze -- ?stromal keratitis - continue po acyclovir, 400 mg 5x/day (started 10/03/17) - D/C trifluridine gtts for now - updated drop instruction sheet given - will refer to Dr[facility] Aron Baba at [facility] for further evaluation and management -- ?stromal herpetic keratitis  2, 3. BRVO w/ CME OD - s/p IVA OD #1 (12.21.18), #2 (01.18.19), #3 (02.14.19), #4 (04.02.19), #5 (05.02.19) - OCT today with peristent IRF/cystic changes - will hold off on injection today due to herpes keratitis  - F/U 4-6 weeks  4, 5. Lattice degeneration with atrophic holes OS-  - peripheral holes superiorly at 1030 without SRF or RD - S/P laser retinopexy OS (01.04.19) - good laser in place -- stable   6. PVD / vitreous syneresis OU  Discussed findings and prognosis  No RT or RD on 360 scleral depressed exam  Reviewed s/s of RT/RD  Strict return for any such RT/RD signs/symptoms  7. Combined forms age-related cataract OU-  - The symptoms of cataract, surgical options, and treatments and risks were discussed with patient. - discussed diagnosis and progression - not yet visually significant -  monitor for now   Ophthalmic Meds Ordered this visit:  No orders of the defined types were placed in this encounter.      Return in about 6 weeks (around 11/24/2017) for F/U BRVO OD, DFE, OCT.  There are no Patient Instructions on file for this visit.   Explained the diagnoses, plan, and follow up with the patient and they expressed understanding.  Patient expressed understanding of the importance of proper follow up care.   This document serves as a record of services personally performed by Gardiner Sleeper, MD, PhD. It was created on their behalf by Ernest Mallick, OA, an  ophthalmic assistant. The creation of this record is the provider's dictation and/or activities during the visit.    Electronically signed by: Ernest Mallick, OA  06.13.2019 11:30 PM    Gardiner Sleeper, M.D., Ph.D. Diseases & Surgery of the Retina and Vitreous [facility]  I have reviewed the above documentation for accuracy and completeness, and I agree with the above. Gardiner Sleeper, M.D., Ph.D. 10/15/17 11:40 PM     Abbreviations: M myopia (nearsighted); A astigmatism; H hyperopia (farsighted); P presbyopia; Mrx spectacle prescription;  CTL contact lenses; OD right eye; OS left eye; OU both eyes  XT exotropia; ET esotropia; PEK punctate epithelial keratitis; PEE punctate epithelial erosions; DES dry eye syndrome; MGD meibomian gland dysfunction; ATs artificial tears; PFAT's preservative free artificial tears; [place] nuclear sclerotic cataract; PSC posterior subcapsular cataract; ERM epi-retinal membrane; PVD posterior vitreous detachment; RD retinal detachment; DM diabetes mellitus; DR diabetic retinopathy; NPDR non-proliferative diabetic retinopathy; PDR proliferative diabetic retinopathy; CSME clinically significant macular edema; DME diabetic macular edema; dbh dot blot hemorrhages; CWS cotton wool spot; POAG primary open angle glaucoma; C/D cup-to-disc ratio; HVF humphrey visual field; GVF goldmann visual field; OCT optical coherence tomography; IOP intraocular pressure; BRVO Branch retinal vein occlusion; CRVO central retinal vein occlusion; CRAO central retinal artery occlusion; BRAO branch retinal artery occlusion; RT retinal tear; SB scleral buckle; PPV pars plana vitrectomy; VH Vitreous hemorrhage; PRP panretinal laser photocoagulation; IVK intravitreal kenalog; VMT vitreomacular traction; MH Macular hole;  NVD neovascularization of the disc; NVE neovascularization elsewhere; AREDS age related eye disease study; ARMD age related macular degeneration; POAG primary open  angle glaucoma; EBMD epithelial/anterior basement membrane dystrophy; ACIOL anterior chamber intraocular lens; IOL intraocular lens; PCIOL posterior chamber intraocular lens; Phaco/IOL phacoemulsification with intraocular lens placement; [place] photorefractive keratectomy; LASIK laser assisted in situ keratomileusis; HTN hypertension; DM diabetes mellitus; COPD chronic obstructive pulmonary disease

## 2017-10-13 ENCOUNTER — Ambulatory Visit (INDEPENDENT_AMBULATORY_CARE_PROVIDER_SITE_OTHER): Payer: Medicare Other | Admitting: Ophthalmology

## 2017-10-13 ENCOUNTER — Encounter (INDEPENDENT_AMBULATORY_CARE_PROVIDER_SITE_OTHER): Payer: Self-pay | Admitting: Ophthalmology

## 2017-10-13 DIAGNOSIS — H43813 Vitreous degeneration, bilateral: Secondary | ICD-10-CM

## 2017-10-13 DIAGNOSIS — H25813 Combined forms of age-related cataract, bilateral: Secondary | ICD-10-CM

## 2017-10-13 DIAGNOSIS — D631 Anemia in chronic kidney disease: Secondary | ICD-10-CM | POA: Diagnosis not present

## 2017-10-13 DIAGNOSIS — B0052 Herpesviral keratitis: Secondary | ICD-10-CM

## 2017-10-13 DIAGNOSIS — H3581 Retinal edema: Secondary | ICD-10-CM

## 2017-10-13 DIAGNOSIS — N2581 Secondary hyperparathyroidism of renal origin: Secondary | ICD-10-CM | POA: Diagnosis not present

## 2017-10-13 DIAGNOSIS — H34832 Tributary (branch) retinal vein occlusion, left eye, with macular edema: Secondary | ICD-10-CM | POA: Diagnosis not present

## 2017-10-13 DIAGNOSIS — H33332 Multiple defects of retina without detachment, left eye: Secondary | ICD-10-CM

## 2017-10-13 DIAGNOSIS — H2000 Unspecified acute and subacute iridocyclitis: Secondary | ICD-10-CM | POA: Diagnosis not present

## 2017-10-13 DIAGNOSIS — N186 End stage renal disease: Secondary | ICD-10-CM | POA: Diagnosis not present

## 2017-10-13 DIAGNOSIS — H34831 Tributary (branch) retinal vein occlusion, right eye, with macular edema: Secondary | ICD-10-CM

## 2017-10-13 DIAGNOSIS — H35412 Lattice degeneration of retina, left eye: Secondary | ICD-10-CM | POA: Diagnosis not present

## 2017-10-15 ENCOUNTER — Encounter (INDEPENDENT_AMBULATORY_CARE_PROVIDER_SITE_OTHER): Payer: Self-pay | Admitting: Ophthalmology

## 2017-10-16 DIAGNOSIS — D631 Anemia in chronic kidney disease: Secondary | ICD-10-CM | POA: Diagnosis not present

## 2017-10-16 DIAGNOSIS — N2581 Secondary hyperparathyroidism of renal origin: Secondary | ICD-10-CM | POA: Diagnosis not present

## 2017-10-16 DIAGNOSIS — N186 End stage renal disease: Secondary | ICD-10-CM | POA: Diagnosis not present

## 2017-10-17 DIAGNOSIS — B0052 Herpesviral keratitis: Secondary | ICD-10-CM | POA: Diagnosis not present

## 2017-10-17 DIAGNOSIS — H18321 Folds in Descemet's membrane, right eye: Secondary | ICD-10-CM | POA: Diagnosis not present

## 2017-10-17 DIAGNOSIS — H1820 Unspecified corneal edema: Secondary | ICD-10-CM | POA: Diagnosis not present

## 2017-10-17 DIAGNOSIS — H1131 Conjunctival hemorrhage, right eye: Secondary | ICD-10-CM | POA: Diagnosis not present

## 2017-10-18 DIAGNOSIS — N186 End stage renal disease: Secondary | ICD-10-CM | POA: Diagnosis not present

## 2017-10-18 DIAGNOSIS — N2581 Secondary hyperparathyroidism of renal origin: Secondary | ICD-10-CM | POA: Diagnosis not present

## 2017-10-18 DIAGNOSIS — D631 Anemia in chronic kidney disease: Secondary | ICD-10-CM | POA: Diagnosis not present

## 2017-10-19 DIAGNOSIS — H1131 Conjunctival hemorrhage, right eye: Secondary | ICD-10-CM | POA: Diagnosis not present

## 2017-10-19 DIAGNOSIS — B0052 Herpesviral keratitis: Secondary | ICD-10-CM | POA: Diagnosis not present

## 2017-10-19 DIAGNOSIS — H18321 Folds in Descemet's membrane, right eye: Secondary | ICD-10-CM | POA: Diagnosis not present

## 2017-10-19 DIAGNOSIS — H1820 Unspecified corneal edema: Secondary | ICD-10-CM | POA: Diagnosis not present

## 2017-10-20 DIAGNOSIS — D631 Anemia in chronic kidney disease: Secondary | ICD-10-CM | POA: Diagnosis not present

## 2017-10-20 DIAGNOSIS — N2581 Secondary hyperparathyroidism of renal origin: Secondary | ICD-10-CM | POA: Diagnosis not present

## 2017-10-20 DIAGNOSIS — N186 End stage renal disease: Secondary | ICD-10-CM | POA: Diagnosis not present

## 2017-10-23 DIAGNOSIS — N2581 Secondary hyperparathyroidism of renal origin: Secondary | ICD-10-CM | POA: Diagnosis not present

## 2017-10-23 DIAGNOSIS — N186 End stage renal disease: Secondary | ICD-10-CM | POA: Diagnosis not present

## 2017-10-23 DIAGNOSIS — D631 Anemia in chronic kidney disease: Secondary | ICD-10-CM | POA: Diagnosis not present

## 2017-10-24 DIAGNOSIS — B0052 Herpesviral keratitis: Secondary | ICD-10-CM | POA: Diagnosis not present

## 2017-10-24 DIAGNOSIS — H1820 Unspecified corneal edema: Secondary | ICD-10-CM | POA: Diagnosis not present

## 2017-10-24 DIAGNOSIS — H1131 Conjunctival hemorrhage, right eye: Secondary | ICD-10-CM | POA: Diagnosis not present

## 2017-10-25 DIAGNOSIS — N2581 Secondary hyperparathyroidism of renal origin: Secondary | ICD-10-CM | POA: Diagnosis not present

## 2017-10-25 DIAGNOSIS — N186 End stage renal disease: Secondary | ICD-10-CM | POA: Diagnosis not present

## 2017-10-25 DIAGNOSIS — D631 Anemia in chronic kidney disease: Secondary | ICD-10-CM | POA: Diagnosis not present

## 2017-10-27 DIAGNOSIS — D631 Anemia in chronic kidney disease: Secondary | ICD-10-CM | POA: Diagnosis not present

## 2017-10-27 DIAGNOSIS — N2581 Secondary hyperparathyroidism of renal origin: Secondary | ICD-10-CM | POA: Diagnosis not present

## 2017-10-27 DIAGNOSIS — N186 End stage renal disease: Secondary | ICD-10-CM | POA: Diagnosis not present

## 2017-10-30 DIAGNOSIS — N186 End stage renal disease: Secondary | ICD-10-CM | POA: Diagnosis not present

## 2017-10-30 DIAGNOSIS — Z992 Dependence on renal dialysis: Secondary | ICD-10-CM | POA: Diagnosis not present

## 2017-10-30 DIAGNOSIS — Q612 Polycystic kidney, adult type: Secondary | ICD-10-CM | POA: Diagnosis not present

## 2017-10-30 DIAGNOSIS — N2581 Secondary hyperparathyroidism of renal origin: Secondary | ICD-10-CM | POA: Diagnosis not present

## 2017-10-31 DIAGNOSIS — H1820 Unspecified corneal edema: Secondary | ICD-10-CM | POA: Diagnosis not present

## 2017-10-31 DIAGNOSIS — B0052 Herpesviral keratitis: Secondary | ICD-10-CM | POA: Diagnosis not present

## 2017-10-31 DIAGNOSIS — H179 Unspecified corneal scar and opacity: Secondary | ICD-10-CM | POA: Diagnosis not present

## 2017-10-31 DIAGNOSIS — H11153 Pinguecula, bilateral: Secondary | ICD-10-CM | POA: Diagnosis not present

## 2017-11-01 DIAGNOSIS — N2581 Secondary hyperparathyroidism of renal origin: Secondary | ICD-10-CM | POA: Diagnosis not present

## 2017-11-01 DIAGNOSIS — N186 End stage renal disease: Secondary | ICD-10-CM | POA: Diagnosis not present

## 2017-11-03 DIAGNOSIS — N2581 Secondary hyperparathyroidism of renal origin: Secondary | ICD-10-CM | POA: Diagnosis not present

## 2017-11-03 DIAGNOSIS — N186 End stage renal disease: Secondary | ICD-10-CM | POA: Diagnosis not present

## 2017-11-06 DIAGNOSIS — N186 End stage renal disease: Secondary | ICD-10-CM | POA: Diagnosis not present

## 2017-11-06 DIAGNOSIS — N2581 Secondary hyperparathyroidism of renal origin: Secondary | ICD-10-CM | POA: Diagnosis not present

## 2017-11-07 DIAGNOSIS — H11153 Pinguecula, bilateral: Secondary | ICD-10-CM | POA: Diagnosis not present

## 2017-11-07 DIAGNOSIS — B0052 Herpesviral keratitis: Secondary | ICD-10-CM | POA: Diagnosis not present

## 2017-11-07 DIAGNOSIS — H1820 Unspecified corneal edema: Secondary | ICD-10-CM | POA: Diagnosis not present

## 2017-11-07 DIAGNOSIS — H179 Unspecified corneal scar and opacity: Secondary | ICD-10-CM | POA: Diagnosis not present

## 2017-11-08 DIAGNOSIS — N186 End stage renal disease: Secondary | ICD-10-CM | POA: Diagnosis not present

## 2017-11-08 DIAGNOSIS — N2581 Secondary hyperparathyroidism of renal origin: Secondary | ICD-10-CM | POA: Diagnosis not present

## 2017-11-10 DIAGNOSIS — N2581 Secondary hyperparathyroidism of renal origin: Secondary | ICD-10-CM | POA: Diagnosis not present

## 2017-11-10 DIAGNOSIS — N186 End stage renal disease: Secondary | ICD-10-CM | POA: Diagnosis not present

## 2017-11-13 DIAGNOSIS — N2581 Secondary hyperparathyroidism of renal origin: Secondary | ICD-10-CM | POA: Diagnosis not present

## 2017-11-13 DIAGNOSIS — N186 End stage renal disease: Secondary | ICD-10-CM | POA: Diagnosis not present

## 2017-11-15 DIAGNOSIS — N2581 Secondary hyperparathyroidism of renal origin: Secondary | ICD-10-CM | POA: Diagnosis not present

## 2017-11-15 DIAGNOSIS — N186 End stage renal disease: Secondary | ICD-10-CM | POA: Diagnosis not present

## 2017-11-17 DIAGNOSIS — N2581 Secondary hyperparathyroidism of renal origin: Secondary | ICD-10-CM | POA: Diagnosis not present

## 2017-11-17 DIAGNOSIS — N186 End stage renal disease: Secondary | ICD-10-CM | POA: Diagnosis not present

## 2017-11-20 DIAGNOSIS — N2581 Secondary hyperparathyroidism of renal origin: Secondary | ICD-10-CM | POA: Diagnosis not present

## 2017-11-20 DIAGNOSIS — N186 End stage renal disease: Secondary | ICD-10-CM | POA: Diagnosis not present

## 2017-11-22 DIAGNOSIS — N186 End stage renal disease: Secondary | ICD-10-CM | POA: Diagnosis not present

## 2017-11-22 DIAGNOSIS — N2581 Secondary hyperparathyroidism of renal origin: Secondary | ICD-10-CM | POA: Diagnosis not present

## 2017-11-23 NOTE — Progress Notes (Signed)
[facility] Note  11/24/2017     CHIEF COMPLAINT Patient presents for Retina Follow Up   HISTORY OF PRESENT ILLNESS: Michael [name] is a 62 y.o. male who presents to the clinic today for:   HPI    Retina Follow Up    Patient presents with  CRVO/BRVO.  In right eye.  Severity is moderate.  Since onset it is stable.  I, the attending physician,  performed the HPI with the patient and updated documentation appropriately.          Comments    F/U BRVO w/ CME OD; Pt states OD VA is improved; Pt states he was seen by Dr. Hinton Lovely and states OD is improving; Pt states he is using cosopt OU BID, PF OD BID, and another gtt but pt is unable to remember the name of it;        Last edited by [name], MD on 11/24/2017  1:28 PM. (History)      Referring physician: [name], MD [place], [place] [id number]  HISTORICAL INFORMATION:   Selected notes from the MEDICAL RECORD NUMBER Referred by Dr. [name]. Hecker for concern of HRVO OD; LEE- 11.30.18 ([name]) {BCVA OD: 20/100-1 OS: 20/20-2] Ocular Hx- cataract OU PMH- HTN, high chol, kidney disease, sleep apnea, emphysema    CURRENT MEDICATIONS: Current Outpatient Medications (Ophthalmic Drugs)  Medication Sig  . dorzolamide-timolol (COSOPT) 22.3-6.8 MG/ML ophthalmic solution Place 1 drop into both eyes 2 (two) times daily.  . Ganciclovir (ZIRGAN) 0.15 % GEL Place 1 drop into the right eye 5 (five) times daily.  . prednisoLONE acetate (PRED FORTE) 1 % ophthalmic suspension Place 1 drop into the right eye every hour while awake.   No current facility-administered medications for this visit.  (Ophthalmic Drugs)   Current Outpatient Medications (Other)  Medication Sig  . acyclovir (ZOVIRAX) 400 MG tablet   . aspirin EC 81 MG EC tablet Take 1 tablet (81 mg total) by mouth daily.  [name] atorvastatin (LIPITOR) 40 MG tablet Take 40 mg by mouth every evening.   . calcium acetate  (PHOSLO) 667 MG capsule Take 1,334 mg by mouth 3 (three) times daily with meals.   . metoprolol succinate (TOPROL-XL) 100 MG 24 hr tablet Take 50 mg by mouth See admin instructions. Take 50 mg by mouth daily on Tuesday, Thursday and Saturday.  . nitroGLYCERIN (NITROSTAT) 0.4 MG SL tablet Place 1 tablet (0.4 mg total) under the tongue every 5 (five) minutes x 3 doses as needed for chest pain.  [name] oxyCODONE-acetaminophen (ROXICET) 5-325 MG tablet Take 1 tablet by mouth every 6 (six) hours as needed.  . SENSIPAR 60 MG tablet Take 60 mg by mouth every evening.    Current Facility-Administered Medications (Other)  Medication Route  . Bevacizumab (AVASTIN) SOLN 1.25 mg Intravitreal  . Bevacizumab (AVASTIN) SOLN 1.25 mg Intravitreal  . Bevacizumab (AVASTIN) SOLN 1.25 mg Intravitreal  . Bevacizumab (AVASTIN) SOLN 1.25 mg Intravitreal  . Bevacizumab (AVASTIN) SOLN 1.25 mg Intravitreal      REVIEW OF SYSTEMS: ROS    Positive for: Cardiovascular, Eyes, Respiratory   Negative for: Constitutional, Gastrointestinal, Neurological, Skin, Genitourinary, Musculoskeletal, HENT, Endocrine, Psychiatric, Allergic/Imm, Heme/Lymph   Last edited by Cherrie Gauze, COA on 11/24/2017  1:06 PM. (History)       ALLERGIES No Known Allergies  PAST MEDICAL HISTORY Past Medical History:  Diagnosis Date  . Dyspnea    on exertion  .  ESRD (end stage renal disease) ([place])    Hemo- MWF, Polycystic kidney disease  . Fatigue   . History of kidney stones    removal of stone- cysto  . Hyperlipidemia   . Hyperparathyroidism, secondary renal ([place])   . Hypertension   . Hypoxemia 12/12/2013  . Nonischemic cardiomyopathy ([place])    Er 25% 2015, 55 % 2013  . OSA on CPAP    no longer using cpap  . OSA on CPAP 03/24/2014  . Pneumonia    2015ish  . Ventricular tachycardia//Freq PVCs    Past Surgical History:  Procedure Laterality Date  . A/V FISTULAGRAM Left 04/27/2017   Procedure: A/V FISTULAGRAM;  Surgeon: Conrad Box, MD;  Location: [place] [facility];  Service: Cardiovascular;  Laterality: Left;  lt arm  . APPENDECTOMY    . AV FISTULA PLACEMENT  12/05/2011   Procedure: ARTERIOVENOUS (AV) FISTULA CREATION;LLEFT ARM  Surgeon: Conrad Palo, MD;  Location: [place];  Service: Vascular;  Laterality: Left;  RADIO-CEPHALIC  fistula left arm  . AV FISTULA PLACEMENT  01/11/2012   Procedure: ARTERIOVENOUS (AV) FISTULA CREATION;  Surgeon: Conrad [place], MD;  Location: [facility];  Service: Vascular;  Laterality: Left;  Creation of left brachial cephalic arteriovenous fistula  . BASCILIC VEIN TRANSPOSITION Left 12/27/2016   Procedure: BASILIC VEIN TRANSPOSITION LEFT UPPER ARM FIRST STAGE;  Surgeon: Conrad [place], MD;  Location: [facility];  Service: Vascular;  Laterality: Left;  . BASCILIC VEIN TRANSPOSITION Left 01/31/2017   Procedure: LEFT ARM BASILIC VEIN TRANSPOSITION, SECOND STAGE;  Surgeon: Conrad Bodcaw, MD;  Location: [facility];  Service: Vascular;  Laterality: Left;  . CARDIAC CATHETERIZATION  04-05-2010   checking for blockage but none-[facility]  . COLONOSCOPY    . CYSTOSCOPY W/ STONE MANIPULATION     "laser once" (01/22/2013)  . HEMATOMA EVACUATION Left 05/09/2017   Procedure: EVACUATION HEMATOMA LEFT ARM;  Surgeon: Conrad [place], MD;  Location: [facility];  Service: Vascular;  Laterality: Left;  . HERNIA REPAIR    . INGUINAL HERNIA REPAIR Right 11/06/2015   Procedure: OPEN HERNIA REPAIR  RIGHT INGUINAL ADULT;  Surgeon: [name], MD;  Location: [facility];  Service: General;  Laterality: Right;  with MESH  . INSERTION OF DIALYSIS CATHETER Right 10/05/2016   Procedure: INSERTION OF right internal jugular DIALYSIS CATHETER;  Surgeon: [name], MD;  Location: [facility];  Service: Vascular;  Laterality: Right;  . INSERTION OF MESH  03/20/2012   Procedure: INSERTION OF MESH;  UMB Surgeon: [name], MD;  Location: [facility];  Service: General;  Laterality: N/A;  . INSERTION OF MESH N/A 01/22/2013   Procedure: INSERTION OF MESH;   Surgeon: [name], MD;  Location: [facility];  Service: General;  Laterality: N/A;  . LAPAROTOMY  04/02/2012   Procedure: EXPLORATORY LAPAROTOMY;  Surgeon: [name], MD;  Location: [facility];  Service: General;  Laterality: N/A;  Exploratory Laparotomy with resection of small intestine  . LEFT HEART CATHETERIZATION WITH CORONARY ANGIOGRAM N/A 05/14/2013   Procedure: LEFT HEART CATHETERIZATION WITH CORONARY ANGIOGRAM;  Surgeon: Sinclair Grooms, MD;  Location: [facility];  Service: Cardiovascular;  Laterality: N/A;  . LIGATION OF ARTERIOVENOUS  FISTULA Left 12/27/2016   Procedure: LIGATION/EXCISION OF LEFT UPPER ARM ARTERIOVENOUS  FISTULA;  EVACUATION OF HEMATOMA;  Surgeon: Conrad , MD;  Location: [facility];  Service: Vascular;  Laterality: Left;  . REVISON OF ARTERIOVENOUS FISTULA Left 10/05/2016   Procedure: REVISON OF  left arm ARTERIOVENOUS FISTULA;  Surgeon: [name], MD;  Location: [place];  Service: Vascular;  Laterality: Left;  . TONSILLECTOMY    . UMBILICAL HERNIA REPAIR  03/20/2012   Procedure: HERNIA REPAIR UMBILICAL ADULT;  Surgeon: [name], MD;  Location: [place];  Service: General;  Laterality: N/A;  . UMBILICAL HERNIA REPAIR  01/22/2013   preperitoneal open procedure due to significant adhesions/notes 01/22/2013  . VENTRAL HERNIA REPAIR N/A 01/22/2013   Procedure: ATTEMPTED LAPAROSCOPIC VENTRAL HERNIA CONVERTED TO OPEN;  Surgeon: [name], MD;  Location[facility];  Service: General;  Laterality: N/A;    FAMILY HISTORY Family History  Problem Relation Age of Onset  . Heart disease Mother   . Hyperlipidemia Mother   . Hypertension Mother   . Kidney disease Father   . Stroke Father   . Kidney disease Brother   . Amblyopia Neg Hx   . Blindness Neg Hx   . Cataracts Neg Hx   . Diabetes Neg Hx   . Glaucoma Neg Hx   . Macular degeneration Neg Hx   . Retinal detachment Neg Hx   . Strabismus Neg Hx   . Retinitis pigmentosa Neg Hx     SOCIAL  HISTORY Social History   Tobacco Use  . Smoking status: Never Smoker  . Smokeless tobacco: Never Used  Substance Use Topics  . Alcohol use: No    Alcohol/week: 0.0 oz  . Drug use: No         OPHTHALMIC EXAM:  Base Eye Exam    Visual Acuity (Snellen - Linear)      Right Left   Dist [place] 20/40 20/25   Dist ph [place] 20/30 20/20   Correction:  Glasses       Tonometry (Tonopen, 1:11 PM)      Right Left   Pressure 15 18       Pupils      Dark Light Shape React APD   Right 5  Round Minimal None   Left 5 4 Round Slow None       Visual Fields (Counting fingers)      Left Right    Full Full       Extraocular Movement      Right Left    Full, Ortho Full, Ortho       Neuro/Psych    Oriented x3:  Yes   Mood/Affect:  Normal       Dilation    Both eyes:  1.0% Mydriacyl, 2.5% Phenylephrine @ 1:11 PM        Slit Lamp and Fundus Exam    Slit Lamp Exam      Right Left   Lids/Lashes Dermatochalasis - upper lid Dermatochalasis - upper lid   Conjunctiva/Sclera Mild Melanosis, Nasal Pinguecula Mild Melanosis, nasal and temporal Pinguecula   Cornea Arcus, no Dendrite or Punctate epithelial erosions, 1-2+ K haze, +central corneal haze Arcus   Anterior Chamber Deep and quiet, no cell or flare Deep and quiet   Iris Round and dilated Round and dilated   Lens 2+ Nuclear sclerosis, 2+ Cortical cataract 2+ Nuclear sclerosis, 2+ Cortical cataract   Vitreous Vitreous syneresis, no cell, +Posterior vitreous detachment Vitreous syneresis, Posterior vitreous detachment       Fundus Exam      Right Left   Disc vascular loops with hemorrhage superiorly improved Normal   C/D Ratio 0.5 0.6   Macula Blunted foveal reflex, trace cystic changes, No heme or edema Flat, No  heme or edema, Good foveal reflex   Vessels Vascular attenuation, dilated and Tortuous, Copper wiring superiorly, AV crossing changes Mild Copper wiring, mildly Tortuous, AV crossing changes   Periphery Attached, DBH  superiorly and temporally, pigmented Chorioretinal scar at 1030 Attached, pigmented lattice with 3 atrophic holes at 1030 ora - good laser surrounding          IMAGING AND PROCEDURES  Imaging and Procedures for 08/31/17  OCT, Retina - OU - Both Eyes       Right Eye Quality was good. Central Foveal Thickness: 249. Progression has improved. Findings include normal foveal contour, outer retinal atrophy, intraretinal fluid, no SRF (Interval improvement in IRF/cystic changes, interval improvement in ellipsoid thinning).   Left Eye Quality was good. Central Foveal Thickness: 267. Progression has been stable. Findings include normal foveal contour, no IRF, no SRF, epiretinal membrane.   Notes *Images captured and stored on drive  Diagnosis / Impression:  OD: interval improvement in focal area of retinal edema/IRF SN to fovea OS: trace ERM  Clinical management:  See below  Abbreviations: NFP - Normal foveal profile. CME - cystoid macular edema. PED - pigment epithelial detachment. IRF - intraretinal fluid. SRF - subretinal fluid. EZ - ellipsoid zone. ERM - epiretinal membrane. ORA - outer retinal atrophy. ORT - outer retinal tubulation. SRHM - subretinal hyper-reflective material                  ASSESSMENT/PLAN:    ICD-10-CM   1. Herpes keratitis B00.52   2. Branch retinal vein occlusion of right eye with macular edema H34.8310   3. Retinal edema H35.81 OCT, Retina - OU - Both Eyes  4. Lattice degeneration of left retina H35.412   5. Atrophic retinal break, multiple, left eye H33.332   6. Posterior vitreous detachment of both eyes H43.813   7. Combined forms of age-related cataract of both eyes H25.813   8. Branch retinal vein occlusion of left eye with macular edema H34.8320   9. Acute anterior uveitis of right eye H20.00    1. Dendritic herpes keratitis OD - likely etiology of prior episodes of Anterior Uveitis OD- now improved (stopped PF)  - FA on 3.18.19 without  posterior involvement, vasculitis or leakage OD - improvement in conj injection, corneal edema, AC cell and KPs - IOP OD 17 today -- on Cosopt BID OD - epithelial dendrite remains resolved today (on po acyclovir; off topical trifluridine), but still with some corneal haze -- ?stromal keratitis - no under the expert management of Dr. Hinton Lovely, [place] [facility] - continue po acyclovir, 400 mg BID, PF BID OD, Cosopt BID per Dr. Hinton Lovely  2,3. BRVO w/ CME OD - s/p IVA OD #1 (12.21.18), #2 (01.18.19), #3 (02.14.19), #4 (04.02.19), #5 (05.02.19) - OCT today with interval improvement in IRF despite no antiVEGF therapy since May 2019 -- almost totally resolved - will hold off on injection today  - F/U 4-6 weeks for DFE/OCT/? injection  4, 5. Lattice degeneration with atrophic holes OS-  - peripheral holes superiorly at 1030 without SRF or RD - S/P laser retinopexy OS (01.04.19) - good laser in place -- stable   6. PVD / vitreous syneresis OU  Discussed findings and prognosis  No RT or RD on 360 scleral depressed exam  Reviewed s/s of RT/RD  Strict return for any such RT/RD signs/symptoms  7. Combined forms age-related cataract OU-  - The symptoms of cataract, surgical options, and treatments and risks were discussed with patient. -  discussed diagnosis and progression - not yet visually significant - monitor for now   Ophthalmic Meds Ordered this visit:  No orders of the defined types were placed in this encounter.      Return for 4-6 weeks.  There are no Patient Instructions on file for this visit.   Explained the diagnoses, plan, and follow up with the patient and they expressed understanding.  Patient expressed understanding of the importance of proper follow up care.   This document serves as a record of services personally performed by Gardiner Sleeper, MD, PhD. It was created on their behalf by Ernest Mallick, OA, an ophthalmic assistant. The creation of this record is the  provider's dictation and/or activities during the visit.    Electronically signed by: Ernest Mallick, OA  07.25.2019 12:33 AM   Gardiner Sleeper, M.D., Ph.D. Diseases & Surgery of the Retina and Vitreous [facility]  I have reviewed the above documentation for accuracy and completeness, and I agree with the above. Gardiner Sleeper, M.D., Ph.D. 11/25/17 12:37 AM   Abbreviations: M myopia (nearsighted); A astigmatism; H hyperopia (farsighted); P presbyopia; Mrx spectacle prescription;  CTL contact lenses; OD right eye; OS left eye; OU both eyes  XT exotropia; ET esotropia; PEK punctate epithelial keratitis; PEE punctate epithelial erosions; DES dry eye syndrome; MGD meibomian gland dysfunction; ATs artificial tears; PFAT's preservative free artificial tears; [place] nuclear sclerotic cataract; PSC posterior subcapsular cataract; ERM epi-retinal membrane; PVD posterior vitreous detachment; RD retinal detachment; DM diabetes mellitus; DR diabetic retinopathy; NPDR non-proliferative diabetic retinopathy; PDR proliferative diabetic retinopathy; CSME clinically significant macular edema; DME diabetic macular edema; dbh dot blot hemorrhages; CWS cotton wool spot; POAG primary open angle glaucoma; C/D cup-to-disc ratio; HVF humphrey visual field; GVF goldmann visual field; OCT optical coherence tomography; IOP intraocular pressure; BRVO Branch retinal vein occlusion; CRVO central retinal vein occlusion; CRAO central retinal artery occlusion; BRAO branch retinal artery occlusion; RT retinal tear; SB scleral buckle; PPV pars plana vitrectomy; VH Vitreous hemorrhage; PRP panretinal laser photocoagulation; IVK intravitreal kenalog; VMT vitreomacular traction; MH Macular hole;  NVD neovascularization of the disc; NVE neovascularization elsewhere; AREDS age related eye disease study; ARMD age related macular degeneration; POAG primary open angle glaucoma; EBMD epithelial/anterior basement membrane  dystrophy; ACIOL anterior chamber intraocular lens; IOL intraocular lens; PCIOL posterior chamber intraocular lens; Phaco/IOL phacoemulsification with intraocular lens placement; [place] photorefractive keratectomy; LASIK laser assisted in situ keratomileusis; HTN hypertension; DM diabetes mellitus; COPD chronic obstructive pulmonary disease

## 2017-11-24 ENCOUNTER — Ambulatory Visit (INDEPENDENT_AMBULATORY_CARE_PROVIDER_SITE_OTHER): Payer: Medicare Other | Admitting: Ophthalmology

## 2017-11-24 ENCOUNTER — Encounter (INDEPENDENT_AMBULATORY_CARE_PROVIDER_SITE_OTHER): Payer: Self-pay | Admitting: Ophthalmology

## 2017-11-24 DIAGNOSIS — H33332 Multiple defects of retina without detachment, left eye: Secondary | ICD-10-CM

## 2017-11-24 DIAGNOSIS — H34831 Tributary (branch) retinal vein occlusion, right eye, with macular edema: Secondary | ICD-10-CM

## 2017-11-24 DIAGNOSIS — H3581 Retinal edema: Secondary | ICD-10-CM

## 2017-11-24 DIAGNOSIS — H43813 Vitreous degeneration, bilateral: Secondary | ICD-10-CM | POA: Diagnosis not present

## 2017-11-24 DIAGNOSIS — H35412 Lattice degeneration of retina, left eye: Secondary | ICD-10-CM | POA: Diagnosis not present

## 2017-11-24 DIAGNOSIS — H34832 Tributary (branch) retinal vein occlusion, left eye, with macular edema: Secondary | ICD-10-CM

## 2017-11-24 DIAGNOSIS — H25813 Combined forms of age-related cataract, bilateral: Secondary | ICD-10-CM | POA: Diagnosis not present

## 2017-11-24 DIAGNOSIS — H2000 Unspecified acute and subacute iridocyclitis: Secondary | ICD-10-CM

## 2017-11-24 DIAGNOSIS — N2581 Secondary hyperparathyroidism of renal origin: Secondary | ICD-10-CM | POA: Diagnosis not present

## 2017-11-24 DIAGNOSIS — N186 End stage renal disease: Secondary | ICD-10-CM | POA: Diagnosis not present

## 2017-11-24 DIAGNOSIS — B0052 Herpesviral keratitis: Secondary | ICD-10-CM | POA: Diagnosis not present

## 2017-11-25 ENCOUNTER — Encounter (INDEPENDENT_AMBULATORY_CARE_PROVIDER_SITE_OTHER): Payer: Self-pay | Admitting: Ophthalmology

## 2017-11-27 DIAGNOSIS — N186 End stage renal disease: Secondary | ICD-10-CM | POA: Diagnosis not present

## 2017-11-27 DIAGNOSIS — N2581 Secondary hyperparathyroidism of renal origin: Secondary | ICD-10-CM | POA: Diagnosis not present

## 2017-11-28 DIAGNOSIS — H179 Unspecified corneal scar and opacity: Secondary | ICD-10-CM | POA: Diagnosis not present

## 2017-11-28 DIAGNOSIS — H34811 Central retinal vein occlusion, right eye, with macular edema: Secondary | ICD-10-CM | POA: Diagnosis not present

## 2017-11-28 DIAGNOSIS — B0052 Herpesviral keratitis: Secondary | ICD-10-CM | POA: Diagnosis not present

## 2017-11-28 DIAGNOSIS — H1820 Unspecified corneal edema: Secondary | ICD-10-CM | POA: Diagnosis not present

## 2017-11-29 DIAGNOSIS — N186 End stage renal disease: Secondary | ICD-10-CM | POA: Diagnosis not present

## 2017-11-29 DIAGNOSIS — N2581 Secondary hyperparathyroidism of renal origin: Secondary | ICD-10-CM | POA: Diagnosis not present

## 2017-11-30 DIAGNOSIS — Z992 Dependence on renal dialysis: Secondary | ICD-10-CM | POA: Diagnosis not present

## 2017-11-30 DIAGNOSIS — N186 End stage renal disease: Secondary | ICD-10-CM | POA: Diagnosis not present

## 2017-11-30 DIAGNOSIS — Q612 Polycystic kidney, adult type: Secondary | ICD-10-CM | POA: Diagnosis not present

## 2017-12-01 DIAGNOSIS — N2581 Secondary hyperparathyroidism of renal origin: Secondary | ICD-10-CM | POA: Diagnosis not present

## 2017-12-01 DIAGNOSIS — N186 End stage renal disease: Secondary | ICD-10-CM | POA: Diagnosis not present

## 2017-12-04 DIAGNOSIS — N186 End stage renal disease: Secondary | ICD-10-CM | POA: Diagnosis not present

## 2017-12-04 DIAGNOSIS — N2581 Secondary hyperparathyroidism of renal origin: Secondary | ICD-10-CM | POA: Diagnosis not present

## 2017-12-06 DIAGNOSIS — N186 End stage renal disease: Secondary | ICD-10-CM | POA: Diagnosis not present

## 2017-12-06 DIAGNOSIS — N2581 Secondary hyperparathyroidism of renal origin: Secondary | ICD-10-CM | POA: Diagnosis not present

## 2017-12-08 DIAGNOSIS — N186 End stage renal disease: Secondary | ICD-10-CM | POA: Diagnosis not present

## 2017-12-08 DIAGNOSIS — N2581 Secondary hyperparathyroidism of renal origin: Secondary | ICD-10-CM | POA: Diagnosis not present

## 2017-12-11 DIAGNOSIS — N2581 Secondary hyperparathyroidism of renal origin: Secondary | ICD-10-CM | POA: Diagnosis not present

## 2017-12-11 DIAGNOSIS — N186 End stage renal disease: Secondary | ICD-10-CM | POA: Diagnosis not present

## 2017-12-13 DIAGNOSIS — N186 End stage renal disease: Secondary | ICD-10-CM | POA: Diagnosis not present

## 2017-12-13 DIAGNOSIS — N2581 Secondary hyperparathyroidism of renal origin: Secondary | ICD-10-CM | POA: Diagnosis not present

## 2017-12-15 DIAGNOSIS — N2581 Secondary hyperparathyroidism of renal origin: Secondary | ICD-10-CM | POA: Diagnosis not present

## 2017-12-15 DIAGNOSIS — N186 End stage renal disease: Secondary | ICD-10-CM | POA: Diagnosis not present

## 2017-12-18 DIAGNOSIS — N186 End stage renal disease: Secondary | ICD-10-CM | POA: Diagnosis not present

## 2017-12-18 DIAGNOSIS — N2581 Secondary hyperparathyroidism of renal origin: Secondary | ICD-10-CM | POA: Diagnosis not present

## 2017-12-19 DIAGNOSIS — B0052 Herpesviral keratitis: Secondary | ICD-10-CM | POA: Diagnosis not present

## 2017-12-19 DIAGNOSIS — H11153 Pinguecula, bilateral: Secondary | ICD-10-CM | POA: Diagnosis not present

## 2017-12-19 DIAGNOSIS — H1820 Unspecified corneal edema: Secondary | ICD-10-CM | POA: Diagnosis not present

## 2017-12-19 DIAGNOSIS — H179 Unspecified corneal scar and opacity: Secondary | ICD-10-CM | POA: Diagnosis not present

## 2017-12-20 DIAGNOSIS — N186 End stage renal disease: Secondary | ICD-10-CM | POA: Diagnosis not present

## 2017-12-20 DIAGNOSIS — N2581 Secondary hyperparathyroidism of renal origin: Secondary | ICD-10-CM | POA: Diagnosis not present

## 2017-12-22 DIAGNOSIS — N2581 Secondary hyperparathyroidism of renal origin: Secondary | ICD-10-CM | POA: Diagnosis not present

## 2017-12-22 DIAGNOSIS — N186 End stage renal disease: Secondary | ICD-10-CM | POA: Diagnosis not present

## 2017-12-25 DIAGNOSIS — N186 End stage renal disease: Secondary | ICD-10-CM | POA: Diagnosis not present

## 2017-12-25 DIAGNOSIS — N2581 Secondary hyperparathyroidism of renal origin: Secondary | ICD-10-CM | POA: Diagnosis not present

## 2017-12-26 ENCOUNTER — Encounter (INDEPENDENT_AMBULATORY_CARE_PROVIDER_SITE_OTHER): Payer: Self-pay | Admitting: Ophthalmology

## 2017-12-26 ENCOUNTER — Ambulatory Visit (INDEPENDENT_AMBULATORY_CARE_PROVIDER_SITE_OTHER): Payer: Medicare Other | Admitting: Ophthalmology

## 2017-12-26 DIAGNOSIS — H33332 Multiple defects of retina without detachment, left eye: Secondary | ICD-10-CM

## 2017-12-26 DIAGNOSIS — H3581 Retinal edema: Secondary | ICD-10-CM

## 2017-12-26 DIAGNOSIS — H43813 Vitreous degeneration, bilateral: Secondary | ICD-10-CM | POA: Diagnosis not present

## 2017-12-26 DIAGNOSIS — H35412 Lattice degeneration of retina, left eye: Secondary | ICD-10-CM | POA: Diagnosis not present

## 2017-12-26 DIAGNOSIS — B0052 Herpesviral keratitis: Secondary | ICD-10-CM

## 2017-12-26 DIAGNOSIS — H25813 Combined forms of age-related cataract, bilateral: Secondary | ICD-10-CM

## 2017-12-26 DIAGNOSIS — H34831 Tributary (branch) retinal vein occlusion, right eye, with macular edema: Secondary | ICD-10-CM | POA: Diagnosis not present

## 2017-12-26 MED ORDER — PREDNISOLONE ACETATE 1 % OP SUSP: 1.0000 [drp] | Freq: Four times a day (QID) | OPHTHALMIC | 0 refills | Status: DC

## 2017-12-26 NOTE — Progress Notes (Addendum)
[facility] Note  12/26/2017     CHIEF COMPLAINT Patient presents for Retina Follow Up   HISTORY OF PRESENT ILLNESS: Michael [name] is a 62 y.o. male who presents to the clinic today for:   HPI    Retina Follow Up    Patient presents with  Other.  In right eye.  Severity is moderate.  Duration of 4 months.  Since onset it is stable.  I, the attending physician,  performed the HPI with the patient and updated documentation appropriately.          Comments    Pt presents for herpes keratitis f/u, pt states VA has been good, pt denies FOL, pain, wavy vision and floaters, pt is using AT's for dryness and still taking the medication he was prescribed       Last edited by [name], MD on 12/26/2017  1:22 PM. (History)    Pt states he is no longer on Pred; Pt states he is still taking acyclovir as directed by Dr. Hinton Lovely; Pt reports he was seen by Dr. Hinton Lovely last week;   Referring physician: [name], MD [place], [place] [address]  HISTORICAL INFORMATION:   Selected notes from the MEDICAL RECORD NUMBER Referred by Dr. [name]. Hecker for concern of HRVO OD; LEE- 11.30.18 ([name]) {BCVA OD: 20/100-1 OS: 20/20-2] Ocular Hx- cataract OU PMH- HTN, high chol, kidney disease, sleep apnea, emphysema    CURRENT MEDICATIONS: Current Outpatient Medications (Ophthalmic Drugs)  Medication Sig  . dorzolamide-timolol (COSOPT) 22.3-6.8 MG/ML ophthalmic solution Place 1 drop into both eyes 2 (two) times daily.  . Ganciclovir (ZIRGAN) 0.15 % GEL Place 1 drop into the right eye 5 (five) times daily.  . prednisoLONE acetate (PRED FORTE) 1 % ophthalmic suspension Place 1 drop into the right eye 4 (four) times daily.   No current facility-administered medications for this visit.  (Ophthalmic Drugs)   Current Outpatient Medications (Other)  Medication Sig  . acyclovir (ZOVIRAX) 400 MG tablet   . aspirin EC 81 MG EC tablet Take 1  tablet (81 mg total) by mouth daily.  [name] atorvastatin (LIPITOR) 40 MG tablet Take 40 mg by mouth every evening.   . calcium acetate (PHOSLO) 667 MG capsule Take 1,334 mg by mouth 3 (three) times daily with meals.   . metoprolol succinate (TOPROL-XL) 100 MG 24 hr tablet Take 50 mg by mouth See admin instructions. Take 50 mg by mouth daily on Tuesday, Thursday and Saturday.  . nitroGLYCERIN (NITROSTAT) 0.4 MG SL tablet Place 1 tablet (0.4 mg total) under the tongue every 5 (five) minutes x 3 doses as needed for chest pain.  [name] oxyCODONE-acetaminophen (ROXICET) 5-325 MG tablet Take 1 tablet by mouth every 6 (six) hours as needed.  [name] RENVELA 800 MG tablet   . SENSIPAR 60 MG tablet Take 60 mg by mouth every evening.    Current Facility-Administered Medications (Other)  Medication Route  . Bevacizumab (AVASTIN) SOLN 1.25 mg Intravitreal  . Bevacizumab (AVASTIN) SOLN 1.25 mg Intravitreal  . Bevacizumab (AVASTIN) SOLN 1.25 mg Intravitreal  . Bevacizumab (AVASTIN) SOLN 1.25 mg Intravitreal  . Bevacizumab (AVASTIN) SOLN 1.25 mg Intravitreal      REVIEW OF SYSTEMS: ROS    Positive for: Endocrine, Cardiovascular, Eyes   Negative for: Constitutional, Gastrointestinal, Neurological, Skin, Genitourinary, Musculoskeletal, HENT, Respiratory, Psychiatric, Allergic/Imm, Heme/Lymph   Last edited by Debbrah Alar, COT on 12/26/2017 12:59 PM. (History)  ALLERGIES No Known Allergies  PAST MEDICAL HISTORY Past Medical History:  Diagnosis Date  . Dyspnea    on exertion  . ESRD (end stage renal disease) ([place])    Hemo- MWF, Polycystic kidney disease  . Fatigue   . History of kidney stones    removal of stone- cysto  . Hyperlipidemia   . Hyperparathyroidism, secondary renal ([place])   . Hypertension   . Hypoxemia 12/12/2013  . Nonischemic cardiomyopathy ([place])    Er 25% 2015, 55 % 2013  . OSA on CPAP    no longer using cpap  . OSA on CPAP 03/24/2014  . Pneumonia    2015ish  . Ventricular  tachycardia//Freq PVCs    Past Surgical History:  Procedure Laterality Date  . A/V FISTULAGRAM Left 04/27/2017   Procedure: A/V FISTULAGRAM;  Surgeon: Conrad Carpendale, MD;  Location: [facility];  Service: Cardiovascular;  Laterality: Left;  lt arm  . APPENDECTOMY    . AV FISTULA PLACEMENT  12/05/2011   Procedure: ARTERIOVENOUS (AV) FISTULA CREATION;LLEFT ARM  Surgeon: Conrad Cumby, MD;  Location: [facility];  Service: Vascular;  Laterality: Left;  RADIO-CEPHALIC  fistula left arm  . AV FISTULA PLACEMENT  01/11/2012   Procedure: ARTERIOVENOUS (AV) FISTULA CREATION;  Surgeon: Conrad Carrizo, MD;  Location: [facility];  Service: Vascular;  Laterality: Left;  Creation of left brachial cephalic arteriovenous fistula  . BASCILIC VEIN TRANSPOSITION Left 12/27/2016   Procedure: BASILIC VEIN TRANSPOSITION LEFT UPPER ARM FIRST STAGE;  Surgeon: Conrad Strasburg, MD;  Location: [facility];  Service: Vascular;  Laterality: Left;  . BASCILIC VEIN TRANSPOSITION Left 01/31/2017   Procedure: LEFT ARM BASILIC VEIN TRANSPOSITION, SECOND STAGE;  Surgeon: Conrad Bienville, MD;  Location: [facility];  Service: Vascular;  Laterality: Left;  . CARDIAC CATHETERIZATION  04-05-2010   checking for blockage but none-[facility]  . COLONOSCOPY    . CYSTOSCOPY W/ STONE MANIPULATION     "laser once" (01/22/2013)  . HEMATOMA EVACUATION Left 05/09/2017   Procedure: EVACUATION HEMATOMA LEFT ARM;  Surgeon: Conrad Beaver, MD;  Location: [place] [facility];  Service: Vascular;  Laterality: Left;  . HERNIA REPAIR    . INGUINAL HERNIA REPAIR Right 11/06/2015   Procedure: OPEN HERNIA REPAIR  RIGHT INGUINAL ADULT;  Surgeon: [name], MD;  Location: [facility];  Service: General;  Laterality: Right;  with MESH  . INSERTION OF DIALYSIS CATHETER Right 10/05/2016   Procedure: INSERTION OF right internal jugular DIALYSIS CATHETER;  Surgeon: [name], MD;  Location: [facility];  Service: Vascular;  Laterality: Right;  . INSERTION OF MESH  03/20/2012   Procedure: INSERTION OF MESH;  UMB  Surgeon: [name], MD;  Location: [facility];  Service: General;  Laterality: N/A;  . INSERTION OF MESH N/A 01/22/2013   Procedure: INSERTION OF MESH;  Surgeon: [name], MD;  Location: [place];  Service: General;  Laterality: N/A;  . LAPAROTOMY  04/02/2012   Procedure: EXPLORATORY LAPAROTOMY;  Surgeon: [name], MD;  Location: [facility];  Service: General;  Laterality: N/A;  Exploratory Laparotomy with resection of small intestine  . LEFT HEART CATHETERIZATION WITH CORONARY ANGIOGRAM N/A 05/14/2013   Procedure: LEFT HEART CATHETERIZATION WITH CORONARY ANGIOGRAM;  Surgeon: Sinclair Grooms, MD;  Location: [facility];  Service: Cardiovascular;  Laterality: N/A;  . LIGATION OF ARTERIOVENOUS  FISTULA Left 12/27/2016   Procedure: LIGATION/EXCISION OF LEFT UPPER ARM ARTERIOVENOUS  FISTULA;  EVACUATION OF HEMATOMA;  Surgeon: Conrad Berlin,  MD;  Location: [facility];  Service: Vascular;  Laterality: Left;  . REVISON OF ARTERIOVENOUS FISTULA Left 10/05/2016   Procedure: REVISON OF left arm ARTERIOVENOUS FISTULA;  Surgeon: [name], MD;  Location: [facility];  Service: Vascular;  Laterality: Left;  . TONSILLECTOMY    . UMBILICAL HERNIA REPAIR  03/20/2012   Procedure: HERNIA REPAIR UMBILICAL ADULT;  Surgeon: [name], MD;  Location: [facility];  Service: General;  Laterality: N/A;  . UMBILICAL HERNIA REPAIR  01/22/2013   preperitoneal open procedure due to significant adhesions/notes 01/22/2013  . VENTRAL HERNIA REPAIR N/A 01/22/2013   Procedure: ATTEMPTED LAPAROSCOPIC VENTRAL HERNIA CONVERTED TO OPEN;  Surgeon: [name], MD;  Location[facility];  Service: General;  Laterality: N/A;    FAMILY HISTORY Family History  Problem Relation Age of Onset  . Heart disease Mother   . Hyperlipidemia Mother   . Hypertension Mother   . Kidney disease Father   . Stroke Father   . Kidney disease Brother   . Amblyopia Neg Hx   . Blindness Neg Hx   . Cataracts Neg Hx   . Diabetes Neg Hx   .  Glaucoma Neg Hx   . Macular degeneration Neg Hx   . Retinal detachment Neg Hx   . Strabismus Neg Hx   . Retinitis pigmentosa Neg Hx     SOCIAL HISTORY Social History   Tobacco Use  . Smoking status: Never Smoker  . Smokeless tobacco: Never Used  Substance Use Topics  . Alcohol use: No    Alcohol/week: 0.0 standard drinks  . Drug use: No         OPHTHALMIC EXAM:  Base Eye Exam    Visual Acuity (Snellen - Linear)      Right Left   Dist cc 20/40 -2 20/25 -2   Dist ph cc 20/30 -2 20/25 +1   Correction:  Glasses       Tonometry (Tonopen, 1:05 PM)      Right Left   Pressure 20 20       Pupils      Dark Light Shape React APD   Right 5 3 Round Slow None   Left 5 3 Round Slow None       Visual Fields (Counting fingers)      Left Right    Full Full       Extraocular Movement      Right Left    Full, Ortho Full, Ortho       Neuro/Psych    Oriented x3:  Yes   Mood/Affect:  Normal       Dilation    Both eyes:  1.0% Mydriacyl, 2.5% Phenylephrine @ 1:05 PM        Slit Lamp and Fundus Exam    Slit Lamp Exam      Right Left   Lids/Lashes Dermatochalasis - upper lid Dermatochalasis - upper lid   Conjunctiva/Sclera Mild Melanosis, Nasal Pinguecula, trace-1+ Injection Mild Melanosis, nasal and temporal Pinguecula   Cornea Arcus, no Dendrite or Punctate epithelial erosions, 1-2+ K haze, punctate KPs inferiorly  Arcus   Anterior Chamber Deep and quiet, no cell or flare Deep and quiet   Iris Round and dilated Round and dilated   Lens 2+ Nuclear sclerosis, 2+ Cortical cataract 2+ Nuclear sclerosis, 2+ Cortical cataract   Vitreous Vitreous syneresis, no cell, +Posterior vitreous detachment Vitreous syneresis, Posterior vitreous detachment       Fundus Exam      Right  Left   Disc vascular loops Normal   C/D Ratio 0.5 0.6   Macula Blunted foveal reflex, trace cystic changes, No heme or edema Flat, No heme or edema, Good foveal reflex   Vessels Vascular  attenuation, dilated and Tortuous, Copper wiring superiorly, AV crossing changes Mild Copper wiring, mildly Tortuous, AV crossing changes   Periphery Attached, DBH superiorly and temporally, pigmented Chorioretinal scar at 1030 Attached, pigmented lattice with 3 atrophic holes at 1030 ora - good laser surrounding          IMAGING AND PROCEDURES  Imaging and Procedures for 08/31/17  OCT, Retina - OU - Both Eyes       Right Eye Quality was good. Central Foveal Thickness: 245. Progression has improved. Findings include normal foveal contour, outer retinal atrophy, no SRF, no IRF (Interval improvement in IRF/cystic changes, interval improvement in ellipsoid thinning).   Left Eye Quality was good. Central Foveal Thickness: 269. Progression has been stable. Findings include normal foveal contour, no IRF, no SRF, epiretinal membrane.   Notes *Images captured and stored on drive  Diagnosis / Impression:  OD: interval improvement in focal area of retinal edema/IRF SN to fovea OS: trace ERM  Clinical management:  See below  Abbreviations: NFP - Normal foveal profile. CME - cystoid macular edema. PED - pigment epithelial detachment. IRF - intraretinal fluid. SRF - subretinal fluid. EZ - ellipsoid zone. ERM - epiretinal membrane. ORA - outer retinal atrophy. ORT - outer retinal tubulation. SRHM - subretinal hyper-reflective material                  ASSESSMENT/PLAN:    ICD-10-CM   1. Herpes keratitis B00.52   2. Branch retinal vein occlusion of right eye with macular edema H34.8310   3. Retinal edema H35.81 OCT, Retina - OU - Both Eyes  4. Lattice degeneration of left retina H35.412   5. Atrophic retinal break, multiple, left eye H33.332   6. Posterior vitreous detachment of both eyes H43.813   7. Combined forms of age-related cataract of both eyes H25.813    1. 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 Hinton Lovely, [facility] - continue po acyclovir, 400 mg BID per Dr. Hinton Lovely - restart Pred Forte OD QID - F/U 4 weeks, DFE, OCT  2,3. BRVO w/ CME OD - s/p IVA OD #1 (12.21.18), #2 (01.18.19), #3 (02.14.19), #4 (04.02.19), #5 (05.02.19) - OCT today with interval improvement in IRF despite no antiVEGF therapy since May 2019 -- almost totally resolved - will continue to hold injection today   4, 5. Lattice degeneration with atrophic holes OS-  - peripheral holes superiorly at 1030 without SRF or RD - S/P laser retinopexy OS (01.04.19) - good laser in place -- stable   6. PVD / vitreous syneresis OU  Discussed findings and prognosis  No RT or RD on 360 scleral depressed exam  Reviewed s/s of RT/RD  Strict return for any such RT/RD signs/symptoms  7. Combined forms age-related cataract OU-  - The symptoms of cataract, surgical options, and treatments  and risks were discussed with patient. - discussed diagnosis and progression - not yet visually significant - monitor for now   Ophthalmic Meds Ordered this visit:  Meds ordered this encounter  Medications  . prednisoLONE acetate (PRED FORTE) 1 % ophthalmic suspension    Sig: Place 1 drop into the right eye 4 (four) times daily.    Dispense:  10 mL    Refill:  0       Return in about 4 weeks (around 01/23/2018) for F/U keratitis OD, DFE, OCT.  There are no Patient Instructions on file for this visit.   Explained the diagnoses, plan, and follow up with the patient and they expressed understanding.  Patient expressed understanding of the importance  of proper follow up care.   This document serves as a record of services personally performed by Gardiner Sleeper, MD, PhD. It was created on their behalf by Catha Brow, [place], a certified ophthalmic assistant. The creation of this record is the provider's dictation and/or activities during the visit.  Electronically signed by: Catha Brow, [place]  08.27.19 2:44 PM    Gardiner Sleeper, M.D., Ph.D. Diseases & Surgery of the Retina and Vitreous [facility]  I have reviewed the above documentation for accuracy and completeness, and I agree with the above. Gardiner Sleeper, M.D., Ph.D. 12/26/17 2:44 PM    Abbreviations: M myopia (nearsighted); A astigmatism; H hyperopia (farsighted); P presbyopia; Mrx spectacle prescription;  CTL contact lenses; OD right eye; OS left eye; OU both eyes  XT exotropia; ET esotropia; PEK punctate epithelial keratitis; PEE punctate epithelial erosions; DES dry eye syndrome; MGD meibomian gland dysfunction; ATs artificial tears; PFAT's preservative free artificial tears; [place] nuclear sclerotic cataract; PSC posterior subcapsular cataract; ERM epi-retinal membrane; PVD posterior vitreous detachment; RD retinal detachment; DM diabetes mellitus; DR diabetic retinopathy; NPDR non-proliferative diabetic retinopathy; PDR proliferative diabetic retinopathy; CSME clinically significant macular edema; DME diabetic macular edema; dbh dot blot hemorrhages; CWS cotton wool spot; POAG primary open angle glaucoma; C/D cup-to-disc ratio; HVF humphrey visual field; GVF goldmann visual field; OCT optical coherence tomography; IOP intraocular pressure; BRVO Branch retinal vein occlusion; CRVO central retinal vein occlusion; CRAO central retinal artery occlusion; BRAO branch retinal artery occlusion; RT retinal tear; SB scleral buckle; PPV pars plana vitrectomy; VH Vitreous hemorrhage; PRP panretinal laser photocoagulation; IVK intravitreal kenalog; VMT vitreomacular  traction; MH Macular hole;  NVD neovascularization of the disc; NVE neovascularization elsewhere; AREDS age related eye disease study; ARMD age related macular degeneration; POAG primary open angle glaucoma; EBMD epithelial/anterior basement membrane dystrophy; ACIOL anterior chamber intraocular lens; IOL intraocular lens; PCIOL posterior chamber intraocular lens; Phaco/IOL phacoemulsification with intraocular lens placement; [place] photorefractive keratectomy; LASIK laser assisted in situ keratomileusis; HTN hypertension; DM diabetes mellitus; COPD chronic obstructive pulmonary disease

## 2017-12-27 DIAGNOSIS — N2581 Secondary hyperparathyroidism of renal origin: Secondary | ICD-10-CM | POA: Diagnosis not present

## 2017-12-27 DIAGNOSIS — N186 End stage renal disease: Secondary | ICD-10-CM | POA: Diagnosis not present

## 2017-12-29 ENCOUNTER — Encounter (INDEPENDENT_AMBULATORY_CARE_PROVIDER_SITE_OTHER): Payer: Medicare Other | Admitting: Ophthalmology

## 2017-12-29 DIAGNOSIS — N186 End stage renal disease: Secondary | ICD-10-CM | POA: Diagnosis not present

## 2017-12-29 DIAGNOSIS — N2581 Secondary hyperparathyroidism of renal origin: Secondary | ICD-10-CM | POA: Diagnosis not present

## 2017-12-31 DIAGNOSIS — Q612 Polycystic kidney, adult type: Secondary | ICD-10-CM | POA: Diagnosis not present

## 2017-12-31 DIAGNOSIS — Z992 Dependence on renal dialysis: Secondary | ICD-10-CM | POA: Diagnosis not present

## 2017-12-31 DIAGNOSIS — N186 End stage renal disease: Secondary | ICD-10-CM | POA: Diagnosis not present

## 2018-01-02 DIAGNOSIS — N2581 Secondary hyperparathyroidism of renal origin: Secondary | ICD-10-CM | POA: Diagnosis not present

## 2018-01-02 DIAGNOSIS — N186 End stage renal disease: Secondary | ICD-10-CM | POA: Diagnosis not present

## 2018-01-02 DIAGNOSIS — Z23 Encounter for immunization: Secondary | ICD-10-CM | POA: Diagnosis not present

## 2018-01-03 DIAGNOSIS — Z23 Encounter for immunization: Secondary | ICD-10-CM | POA: Diagnosis not present

## 2018-01-03 DIAGNOSIS — N2581 Secondary hyperparathyroidism of renal origin: Secondary | ICD-10-CM | POA: Diagnosis not present

## 2018-01-03 DIAGNOSIS — N186 End stage renal disease: Secondary | ICD-10-CM | POA: Diagnosis not present

## 2018-01-05 DIAGNOSIS — Z23 Encounter for immunization: Secondary | ICD-10-CM | POA: Diagnosis not present

## 2018-01-05 DIAGNOSIS — N2581 Secondary hyperparathyroidism of renal origin: Secondary | ICD-10-CM | POA: Diagnosis not present

## 2018-01-05 DIAGNOSIS — N186 End stage renal disease: Secondary | ICD-10-CM | POA: Diagnosis not present

## 2018-01-08 DIAGNOSIS — N2581 Secondary hyperparathyroidism of renal origin: Secondary | ICD-10-CM | POA: Diagnosis not present

## 2018-01-08 DIAGNOSIS — Z23 Encounter for immunization: Secondary | ICD-10-CM | POA: Diagnosis not present

## 2018-01-08 DIAGNOSIS — N186 End stage renal disease: Secondary | ICD-10-CM | POA: Diagnosis not present

## 2018-01-10 DIAGNOSIS — Z23 Encounter for immunization: Secondary | ICD-10-CM | POA: Diagnosis not present

## 2018-01-10 DIAGNOSIS — N2581 Secondary hyperparathyroidism of renal origin: Secondary | ICD-10-CM | POA: Diagnosis not present

## 2018-01-10 DIAGNOSIS — N186 End stage renal disease: Secondary | ICD-10-CM | POA: Diagnosis not present

## 2018-01-12 DIAGNOSIS — N186 End stage renal disease: Secondary | ICD-10-CM | POA: Diagnosis not present

## 2018-01-12 DIAGNOSIS — Z23 Encounter for immunization: Secondary | ICD-10-CM | POA: Diagnosis not present

## 2018-01-12 DIAGNOSIS — N2581 Secondary hyperparathyroidism of renal origin: Secondary | ICD-10-CM | POA: Diagnosis not present

## 2018-01-15 DIAGNOSIS — Z23 Encounter for immunization: Secondary | ICD-10-CM | POA: Diagnosis not present

## 2018-01-15 DIAGNOSIS — N2581 Secondary hyperparathyroidism of renal origin: Secondary | ICD-10-CM | POA: Diagnosis not present

## 2018-01-15 DIAGNOSIS — N186 End stage renal disease: Secondary | ICD-10-CM | POA: Diagnosis not present

## 2018-01-17 DIAGNOSIS — N2581 Secondary hyperparathyroidism of renal origin: Secondary | ICD-10-CM | POA: Diagnosis not present

## 2018-01-17 DIAGNOSIS — N186 End stage renal disease: Secondary | ICD-10-CM | POA: Diagnosis not present

## 2018-01-17 DIAGNOSIS — Z23 Encounter for immunization: Secondary | ICD-10-CM | POA: Diagnosis not present

## 2018-01-19 DIAGNOSIS — N2581 Secondary hyperparathyroidism of renal origin: Secondary | ICD-10-CM | POA: Diagnosis not present

## 2018-01-19 DIAGNOSIS — Z23 Encounter for immunization: Secondary | ICD-10-CM | POA: Diagnosis not present

## 2018-01-19 DIAGNOSIS — N186 End stage renal disease: Secondary | ICD-10-CM | POA: Diagnosis not present

## 2018-01-22 DIAGNOSIS — N186 End stage renal disease: Secondary | ICD-10-CM | POA: Diagnosis not present

## 2018-01-22 DIAGNOSIS — Z23 Encounter for immunization: Secondary | ICD-10-CM | POA: Diagnosis not present

## 2018-01-22 DIAGNOSIS — N2581 Secondary hyperparathyroidism of renal origin: Secondary | ICD-10-CM | POA: Diagnosis not present

## 2018-01-22 NOTE — Progress Notes (Signed)
[facility] Note  01/23/2018     CHIEF COMPLAINT Patient presents for Retina Follow Up   HISTORY OF PRESENT ILLNESS: Michael [name] is a 62 y.o. male who presents to the clinic today for:   HPI    Retina Follow Up    Patient presents with  Other.  In both eyes.  This started 3 months ago.  Severity is mild.  Since onset it is stable.  I, the attending physician,  performed the HPI with the patient and updated documentation appropriately.          Comments    F/U Herpes keratitis. Patient states his vision has been "good", denies new visual onsets. Pt is using PF as instructed.        Last edited by [name], MD on 01/23/2018  1:29 PM. (History)      Referring physician: [name], MD [place],  [id number]  HISTORICAL INFORMATION:   Selected notes from the MEDICAL RECORD NUMBER Referred by Dr. [name]. Hecker for concern of HRVO OD; LEE- 11.30.18 ([name]) {BCVA OD: 20/100-1 OS: 20/20-2] Ocular Hx- cataract OU PMH- HTN, high chol, kidney disease, sleep apnea, emphysema    CURRENT MEDICATIONS: Current Outpatient Medications (Ophthalmic Drugs)  Medication Sig  . dorzolamide-timolol (COSOPT) 22.3-6.8 MG/ML ophthalmic solution Place 1 drop into both eyes 2 (two) times daily.  . Ganciclovir (ZIRGAN) 0.15 % GEL Place 1 drop into the right eye 5 (five) times daily.  . prednisoLONE acetate (PRED FORTE) 1 % ophthalmic suspension Place 1 drop into the right eye 4 (four) times daily.   No current facility-administered medications for this visit.  (Ophthalmic Drugs)   Current Outpatient Medications (Other)  Medication Sig  . acyclovir (ZOVIRAX) 400 MG tablet   . aspirin EC 81 MG EC tablet Take 1 tablet (81 mg total) by mouth daily.  [name] atorvastatin (LIPITOR) 40 MG tablet Take 40 mg by mouth every evening.   . calcium acetate (PHOSLO) 667 MG capsule Take 1,334 mg by mouth 3 (three) times daily with meals.   .  metoprolol succinate (TOPROL-XL) 100 MG 24 hr tablet Take 50 mg by mouth See admin instructions. Take 50 mg by mouth daily on Tuesday, Thursday and Saturday.  . nitroGLYCERIN (NITROSTAT) 0.4 MG SL tablet Place 1 tablet (0.4 mg total) under the tongue every 5 (five) minutes x 3 doses as needed for chest pain.  [name] oxyCODONE-acetaminophen (ROXICET) 5-325 MG tablet Take 1 tablet by mouth every 6 (six) hours as needed.  [name] RENVELA 800 MG tablet   . SENSIPAR 60 MG tablet Take 60 mg by mouth every evening.    Current Facility-Administered Medications (Other)  Medication Route  . Bevacizumab (AVASTIN) SOLN 1.25 mg Intravitreal  . Bevacizumab (AVASTIN) SOLN 1.25 mg Intravitreal  . Bevacizumab (AVASTIN) SOLN 1.25 mg Intravitreal  . Bevacizumab (AVASTIN) SOLN 1.25 mg Intravitreal  . Bevacizumab (AVASTIN) SOLN 1.25 mg Intravitreal      REVIEW OF SYSTEMS: ROS    Positive for: Eyes   Negative for: Constitutional, Gastrointestinal, Neurological, Skin, Genitourinary, Musculoskeletal, HENT, Endocrine, Cardiovascular, Respiratory, Psychiatric, Allergic/Imm, Heme/Lymph   Last edited by Zenovia Jordan, LPN on 10/13/4313  4:00 PM. (History)       ALLERGIES No Known Allergies  PAST MEDICAL HISTORY Past Medical History:  Diagnosis Date  . Dyspnea    on exertion  . ESRD (end stage renal disease) ([place])    Hemo- MWF, Polycystic kidney disease  .  Fatigue   . History of kidney stones    removal of stone- cysto  . Hyperlipidemia   . Hyperparathyroidism, secondary renal ([place])   . Hypertension   . Hypoxemia 12/12/2013  . Nonischemic cardiomyopathy ([place])    Er 25% 2015, 55 % 2013  . OSA on CPAP    no longer using cpap  . OSA on CPAP 03/24/2014  . Pneumonia    2015ish  . Ventricular tachycardia//Freq PVCs    Past Surgical History:  Procedure Laterality Date  . A/V FISTULAGRAM Left 04/27/2017   Procedure: A/V FISTULAGRAM;  Surgeon: Conrad Bucyrus, MD;  Location: [facility];  Service:  Cardiovascular;  Laterality: Left;  lt arm  . APPENDECTOMY    . AV FISTULA PLACEMENT  12/05/2011   Procedure: ARTERIOVENOUS (AV) FISTULA CREATION;LLEFT ARM  Surgeon: Conrad Liverpool, MD;  Location: [facility];  Service: Vascular;  Laterality: Left;  RADIO-CEPHALIC  fistula left arm  . AV FISTULA PLACEMENT  01/11/2012   Procedure: ARTERIOVENOUS (AV) FISTULA CREATION;  Surgeon: Conrad Versailles, MD;  Location: [place];  Service: Vascular;  Laterality: Left;  Creation of left brachial cephalic arteriovenous fistula  . BASCILIC VEIN TRANSPOSITION Left 12/27/2016   Procedure: BASILIC VEIN TRANSPOSITION LEFT UPPER ARM FIRST STAGE;  Surgeon: Conrad Nome, MD;  Location: [facility];  Service: Vascular;  Laterality: Left;  . BASCILIC VEIN TRANSPOSITION Left 01/31/2017   Procedure: LEFT ARM BASILIC VEIN TRANSPOSITION, SECOND STAGE;  Surgeon: Conrad Cromwell, MD;  Location: [facility];  Service: Vascular;  Laterality: Left;  . CARDIAC CATHETERIZATION  04-05-2010   checking for blockage but none-[facility]  . COLONOSCOPY    . CYSTOSCOPY W/ STONE MANIPULATION     "laser once" (01/22/2013)  . HEMATOMA EVACUATION Left 05/09/2017   Procedure: EVACUATION HEMATOMA LEFT ARM;  Surgeon: Conrad Shenandoah, MD;  Location: [facility];  Service: Vascular;  Laterality: Left;  . HERNIA REPAIR    . INGUINAL HERNIA REPAIR Right 11/06/2015   Procedure: OPEN HERNIA REPAIR  RIGHT INGUINAL ADULT;  Surgeon: [name], MD;  Location: [facility];  Service: General;  Laterality: Right;  with MESH  . INSERTION OF DIALYSIS CATHETER Right 10/05/2016   Procedure: INSERTION OF right internal jugular DIALYSIS CATHETER;  Surgeon: [name], MD;  Location: [place];  Service: Vascular;  Laterality: Right;  . INSERTION OF MESH  03/20/2012   Procedure: INSERTION OF MESH;  UMB Surgeon: [name], MD;  Location: [place];  Service: General;  Laterality: N/A;  . INSERTION OF MESH N/A 01/22/2013   Procedure: INSERTION OF MESH;  Surgeon: [name], MD;  Location: [facility];  Service:  General;  Laterality: N/A;  . LAPAROTOMY  04/02/2012   Procedure: EXPLORATORY LAPAROTOMY;  Surgeon: [name], MD;  Location: [place];  Service: General;  Laterality: N/A;  Exploratory Laparotomy with resection of small intestine  . LEFT HEART CATHETERIZATION WITH CORONARY ANGIOGRAM N/A 05/14/2013   Procedure: LEFT HEART CATHETERIZATION WITH CORONARY ANGIOGRAM;  Surgeon: Sinclair Grooms, MD;  Location: [facility];  Service: Cardiovascular;  Laterality: N/A;  . LIGATION OF ARTERIOVENOUS  FISTULA Left 12/27/2016   Procedure: LIGATION/EXCISION OF LEFT UPPER ARM ARTERIOVENOUS  FISTULA;  EVACUATION OF HEMATOMA;  Surgeon: Conrad Mercerville, MD;  Location: [place];  Service: Vascular;  Laterality: Left;  . REVISON OF ARTERIOVENOUS FISTULA Left 10/05/2016   Procedure: REVISON OF left arm ARTERIOVENOUS FISTULA;  Surgeon: [name], MD;  Location: [place];  Service:  Vascular;  Laterality: Left;  . TONSILLECTOMY    . UMBILICAL HERNIA REPAIR  03/20/2012   Procedure: HERNIA REPAIR UMBILICAL ADULT;  Surgeon: [name], MD;  Location: [place];  Service: General;  Laterality: N/A;  . UMBILICAL HERNIA REPAIR  01/22/2013   preperitoneal open procedure due to significant adhesions/notes 01/22/2013  . VENTRAL HERNIA REPAIR N/A 01/22/2013   Procedure: ATTEMPTED LAPAROSCOPIC VENTRAL HERNIA CONVERTED TO OPEN;  Surgeon: [name], MD;  Location[facility];  Service: General;  Laterality: N/A;    FAMILY HISTORY Family History  Problem Relation Age of Onset  . Heart disease Mother   . Hyperlipidemia Mother   . Hypertension Mother   . Kidney disease Father   . Stroke Father   . Kidney disease Brother   . Amblyopia Neg Hx   . Blindness Neg Hx   . Cataracts Neg Hx   . Diabetes Neg Hx   . Glaucoma Neg Hx   . Macular degeneration Neg Hx   . Retinal detachment Neg Hx   . Strabismus Neg Hx   . Retinitis pigmentosa Neg Hx     SOCIAL HISTORY Social History   Tobacco Use  . Smoking status: Never Smoker   . Smokeless tobacco: Never Used  Substance Use Topics  . Alcohol use: No    Alcohol/week: 0.0 standard drinks  . Drug use: No         OPHTHALMIC EXAM:  Base Eye Exam    Visual Acuity (Snellen - Linear)      Right Left   Dist cc 20/40 +2 20/30 +2   Dist ph cc 20/30 -1 20/25   Correction:  Glasses       Tonometry (Tonopen, 1:08 PM)      Right Left   Pressure 12 14       Pupils      Dark Light Shape React APD   Right 4 3 Round Brisk None   Left 4 3 Round Brisk None       Visual Fields (Counting fingers)      Left Right    Full Full       Extraocular Movement      Right Left    Full, Ortho Full, Ortho       Neuro/Psych    Oriented x3:  Yes   Mood/Affect:  Normal       Dilation    Both eyes:  1.0% Mydriacyl, 2.5% Phenylephrine @ 1:06 PM        Slit Lamp and Fundus Exam    Slit Lamp Exam      Right Left   Lids/Lashes Dermatochalasis - upper lid Dermatochalasis - upper lid   Conjunctiva/Sclera Mild Melanosis, Nasal Pinguecula, trace-1+ Injection Mild Melanosis, nasal and temporal Pinguecula   Cornea Arcus, no Dendrite or Punctate epithelial erosions, mild K haze, punctate KPs resolved Arcus   Anterior Chamber Deep and quiet, no cell or flare Deep and quiet   Iris Round and dilated Round and dilated   Lens 2+ Nuclear sclerosis, 2+ Cortical cataract 2+ Nuclear sclerosis, 2+ Cortical cataract   Vitreous Vitreous syneresis, no cell, +Posterior vitreous detachment Vitreous syneresis, Posterior vitreous detachment       Fundus Exam      Right Left   Disc vascular loops Normal   C/D Ratio 0.5 0.6   Macula Blunted foveal reflex, No heme or edema Flat, No heme or edema, Good foveal reflex   Vessels Vascular attenuation, dilated and Tortuous, Copper wiring superiorly,  AV crossing changes Mild Copper wiring, mildly Tortuous, AV crossing changes   Periphery Attached, DBH superiorly and temporally, pigmented Chorioretinal scar at 1030 Attached, pigmented lattice  with 3 atrophic holes at 1030 ora - good laser surrounding          IMAGING AND PROCEDURES  Imaging and Procedures for 08/31/17  OCT, Retina - OU - Both Eyes       Right Eye Quality was good. Central Foveal Thickness: 247. Progression has been stable. Findings include normal foveal contour, outer retinal atrophy, no SRF, no IRF (mild ellipsoid thinning -- stable).   Left Eye Quality was good. Central Foveal Thickness: 266. Progression has been stable. Findings include normal foveal contour, no IRF, no SRF, epiretinal membrane.   Notes *Images captured and stored on drive  Diagnosis / Impression:  OD: NFP, No IRF/SRF, stable residual ellipsoid thinning OS: trace ERM  Clinical management:  See below  Abbreviations: NFP - Normal foveal profile. CME - cystoid macular edema. PED - pigment epithelial detachment. IRF - intraretinal fluid. SRF - subretinal fluid. EZ - ellipsoid zone. ERM - epiretinal membrane. ORA - outer retinal atrophy. ORT - outer retinal tubulation. SRHM - subretinal hyper-reflective material                  ASSESSMENT/PLAN:    ICD-10-CM   1. Herpes keratitis B00.52   2. Branch retinal vein occlusion of right eye with macular edema H34.8310   3. Retinal edema H35.81 OCT, Retina - OU - Both Eyes  4. Lattice degeneration of left retina H35.412   5. Atrophic retinal break, multiple, left eye H33.332   6. Posterior vitreous detachment of both eyes H43.813   7. Combined forms of age-related cataract of both eyes H25.813   8. Branch retinal vein occlusion of left eye with macular edema H34.8320   9. Acute anterior uveitis of right eye H20.00    1. Dendritic herpes keratitis OD - likely etiology of prior episodes of Anterior Uveitis OD- now improved - FA on 3.18.19 without posterior involvement, vasculitis or leakage OD - improvement in conj injection, corneal edema, AC cell and KPs - IOP 12/14 OU today -- no longer on Cosopt - epithelial dendrite  remains resolved today (on po acyclovir; off topical trifluridine), but still with some corneal haze and now stellate KP inferiorly is resolved (PF QID) - under the expert management of Dr. Hinton Lovely, [facility] - continue po acyclovir, 400 mg BID per Dr. Hinton Lovely - begin slow taper of Pred Forte OD decrease by 1 gtt q 2 weeks and continue at QD until follow up - F/U December, DFE, OCT  2,3. BRVO w/ CME OD - s/p IVA OD #1 (12.21.18), #2 (01.18.19), #3 (02.14.19), #4 (04.02.19), #5 (05.02.19) - OCT today with interval improvement in IRF despite no antiVEGF therapy since May 2019 -- essentially resolved - will continue to hold injection today   4,5. Lattice degeneration with atrophic holes OS-  - peripheral holes superiorly at 1030 without SRF or RD - S/P laser retinopexy OS (01.04.19) - good laser in place -- stable   6. PVD / vitreous syneresis OU  Discussed findings and prognosis  No RT or RD on 360 scleral depressed exam  Reviewed s/s of RT/RD  Strict return for any such RT/RD signs/symptoms  7. Combined forms age-related cataract OU-  - The symptoms of cataract, surgical options, and treatments and risks were discussed with patient. - discussed diagnosis and progression - not yet visually significant -  monitor for now   Ophthalmic Meds Ordered this visit:  No orders of the defined types were placed in this encounter.      Return in about 3 months (around 04/24/2018) for F/U herpes keratitis OD, DFE, OCT.  There are no Patient Instructions on file for this visit.   Explained the diagnoses, plan, and follow up with the patient and they expressed understanding.  Patient expressed understanding of the importance of proper follow up care.   This document serves as a record of services personally performed by Gardiner Sleeper, MD, PhD. It was created on their behalf by Ernest Mallick, OA, an ophthalmic assistant. The creation of this record is the provider's dictation and/or  activities during the visit.    Electronically signed by: Ernest Mallick, OA  09.23.19 10:26 PM   This document serves as a record of services personally performed by Gardiner Sleeper, MD, PhD. It was created on their behalf by Catha Brow, [place], a certified ophthalmic assistant. The creation of this record is the provider's dictation and/or activities during the visit.  Electronically signed by: Catha Brow, [place]  09.24.19 10:26 PM   Gardiner Sleeper, M.D., Ph.D. Diseases & Surgery of the Retina and Vitreous [facility]  I have reviewed the above documentation for accuracy and completeness, and I agree with the above. Gardiner Sleeper, M.D., Ph.D. 01/24/18 10:28 PM     Abbreviations: M myopia (nearsighted); A astigmatism; H hyperopia (farsighted); P presbyopia; Mrx spectacle prescription;  CTL contact lenses; OD right eye; OS left eye; OU both eyes  XT exotropia; ET esotropia; PEK punctate epithelial keratitis; PEE punctate epithelial erosions; DES dry eye syndrome; MGD meibomian gland dysfunction; ATs artificial tears; PFAT's preservative free artificial tears; [place] nuclear sclerotic cataract; PSC posterior subcapsular cataract; ERM epi-retinal membrane; PVD posterior vitreous detachment; RD retinal detachment; DM diabetes mellitus; DR diabetic retinopathy; NPDR non-proliferative diabetic retinopathy; PDR proliferative diabetic retinopathy; CSME clinically significant macular edema; DME diabetic macular edema; dbh dot blot hemorrhages; CWS cotton wool spot; POAG primary open angle glaucoma; C/D cup-to-disc ratio; HVF humphrey visual field; GVF goldmann visual field; OCT optical coherence tomography; IOP intraocular pressure; BRVO Branch retinal vein occlusion; CRVO central retinal vein occlusion; CRAO central retinal artery occlusion; BRAO branch retinal artery occlusion; RT retinal tear; SB scleral buckle; PPV pars plana vitrectomy; VH Vitreous hemorrhage; PRP panretinal  laser photocoagulation; IVK intravitreal kenalog; VMT vitreomacular traction; MH Macular hole;  NVD neovascularization of the disc; NVE neovascularization elsewhere; AREDS age related eye disease study; ARMD age related macular degeneration; POAG primary open angle glaucoma; EBMD epithelial/anterior basement membrane dystrophy; ACIOL anterior chamber intraocular lens; IOL intraocular lens; PCIOL posterior chamber intraocular lens; Phaco/IOL phacoemulsification with intraocular lens placement; [place] photorefractive keratectomy; LASIK laser assisted in situ keratomileusis; HTN hypertension; DM diabetes mellitus; COPD chronic obstructive pulmonary disease

## 2018-01-23 ENCOUNTER — Encounter (INDEPENDENT_AMBULATORY_CARE_PROVIDER_SITE_OTHER): Payer: Self-pay | Admitting: Ophthalmology

## 2018-01-23 ENCOUNTER — Ambulatory Visit (INDEPENDENT_AMBULATORY_CARE_PROVIDER_SITE_OTHER): Payer: Medicare Other | Admitting: Ophthalmology

## 2018-01-23 DIAGNOSIS — H43813 Vitreous degeneration, bilateral: Secondary | ICD-10-CM

## 2018-01-23 DIAGNOSIS — H33332 Multiple defects of retina without detachment, left eye: Secondary | ICD-10-CM | POA: Diagnosis not present

## 2018-01-23 DIAGNOSIS — H35412 Lattice degeneration of retina, left eye: Secondary | ICD-10-CM | POA: Diagnosis not present

## 2018-01-23 DIAGNOSIS — H25813 Combined forms of age-related cataract, bilateral: Secondary | ICD-10-CM | POA: Diagnosis not present

## 2018-01-23 DIAGNOSIS — H3581 Retinal edema: Secondary | ICD-10-CM | POA: Diagnosis not present

## 2018-01-23 DIAGNOSIS — H34831 Tributary (branch) retinal vein occlusion, right eye, with macular edema: Secondary | ICD-10-CM | POA: Diagnosis not present

## 2018-01-23 DIAGNOSIS — B0052 Herpesviral keratitis: Secondary | ICD-10-CM | POA: Diagnosis not present

## 2018-01-24 ENCOUNTER — Encounter (INDEPENDENT_AMBULATORY_CARE_PROVIDER_SITE_OTHER): Payer: Self-pay | Admitting: Ophthalmology

## 2018-01-24 DIAGNOSIS — N2581 Secondary hyperparathyroidism of renal origin: Secondary | ICD-10-CM | POA: Diagnosis not present

## 2018-01-24 DIAGNOSIS — N186 End stage renal disease: Secondary | ICD-10-CM | POA: Diagnosis not present

## 2018-01-24 DIAGNOSIS — Z23 Encounter for immunization: Secondary | ICD-10-CM | POA: Diagnosis not present

## 2018-01-26 DIAGNOSIS — N186 End stage renal disease: Secondary | ICD-10-CM | POA: Diagnosis not present

## 2018-01-26 DIAGNOSIS — N2581 Secondary hyperparathyroidism of renal origin: Secondary | ICD-10-CM | POA: Diagnosis not present

## 2018-01-26 DIAGNOSIS — Z23 Encounter for immunization: Secondary | ICD-10-CM | POA: Diagnosis not present

## 2018-01-29 ENCOUNTER — Emergency Department (HOSPITAL_COMMUNITY): Payer: No Typology Code available for payment source

## 2018-01-29 ENCOUNTER — Other Ambulatory Visit: Payer: Self-pay

## 2018-01-29 ENCOUNTER — Encounter (HOSPITAL_COMMUNITY): Payer: Self-pay | Admitting: Emergency Medicine

## 2018-01-29 ENCOUNTER — Emergency Department (HOSPITAL_COMMUNITY)
Admission: EM | Admit: 2018-01-29 | Discharge: 2018-01-29 | Disposition: A | Discharge status: Home / Self Care | Payer: No Typology Code available for payment source | Attending: Emergency Medicine | Admitting: Emergency Medicine

## 2018-01-29 DIAGNOSIS — Z79899 Other long term (current) drug therapy: Secondary | ICD-10-CM | POA: Diagnosis not present

## 2018-01-29 DIAGNOSIS — Z992 Dependence on renal dialysis: Secondary | ICD-10-CM | POA: Diagnosis not present

## 2018-01-29 DIAGNOSIS — I429 Cardiomyopathy, unspecified: Secondary | ICD-10-CM | POA: Insufficient documentation

## 2018-01-29 DIAGNOSIS — S299XXA Unspecified injury of thorax, initial encounter: Secondary | ICD-10-CM | POA: Insufficient documentation

## 2018-01-29 DIAGNOSIS — N186 End stage renal disease: Secondary | ICD-10-CM | POA: Diagnosis not present

## 2018-01-29 DIAGNOSIS — Y92410 Unspecified street and highway as the place of occurrence of the external cause: Secondary | ICD-10-CM | POA: Diagnosis not present

## 2018-01-29 DIAGNOSIS — Y999 Unspecified external cause status: Secondary | ICD-10-CM | POA: Insufficient documentation

## 2018-01-29 DIAGNOSIS — R918 Other nonspecific abnormal finding of lung field: Secondary | ICD-10-CM | POA: Diagnosis not present

## 2018-01-29 DIAGNOSIS — I120 Hypertensive chronic kidney disease with stage 5 chronic kidney disease or end stage renal disease: Secondary | ICD-10-CM | POA: Insufficient documentation

## 2018-01-29 DIAGNOSIS — Y9389 Activity, other specified: Secondary | ICD-10-CM | POA: Diagnosis not present

## 2018-01-29 DIAGNOSIS — Z7982 Long term (current) use of aspirin: Secondary | ICD-10-CM | POA: Insufficient documentation

## 2018-01-29 DIAGNOSIS — R0789 Other chest pain: Secondary | ICD-10-CM | POA: Diagnosis not present

## 2018-01-29 DIAGNOSIS — N2581 Secondary hyperparathyroidism of renal origin: Secondary | ICD-10-CM | POA: Insufficient documentation
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
## 2018-01-29 NOTE — ED Notes (Signed)
Bed: [facility] Expected date:  Expected time:  Means of arrival:  Comments: 
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
## 2018-01-30 ENCOUNTER — Other Ambulatory Visit (INDEPENDENT_AMBULATORY_CARE_PROVIDER_SITE_OTHER): Payer: Self-pay | Admitting: Ophthalmology

## 2018-01-30 DIAGNOSIS — Q612 Polycystic kidney, adult type: Secondary | ICD-10-CM | POA: Diagnosis not present

## 2018-01-30 DIAGNOSIS — S134XXA Sprain of ligaments of cervical spine, initial encounter: Secondary | ICD-10-CM | POA: Diagnosis not present

## 2018-01-30 DIAGNOSIS — Z992 Dependence on renal dialysis: Secondary | ICD-10-CM | POA: Diagnosis not present

## 2018-01-30 DIAGNOSIS — N186 End stage renal disease: Secondary | ICD-10-CM | POA: Diagnosis not present

## 2018-01-31 DIAGNOSIS — N2581 Secondary hyperparathyroidism of renal origin: Secondary | ICD-10-CM | POA: Diagnosis not present

## 2018-01-31 DIAGNOSIS — N186 End stage renal disease: Secondary | ICD-10-CM | POA: Diagnosis not present

## 2018-02-02 DIAGNOSIS — N2581 Secondary hyperparathyroidism of renal origin: Secondary | ICD-10-CM | POA: Diagnosis not present

## 2018-02-02 DIAGNOSIS — N186 End stage renal disease: Secondary | ICD-10-CM | POA: Diagnosis not present

## 2018-02-05 DIAGNOSIS — N2581 Secondary hyperparathyroidism of renal origin: Secondary | ICD-10-CM | POA: Diagnosis not present

## 2018-02-05 DIAGNOSIS — N186 End stage renal disease: Secondary | ICD-10-CM | POA: Diagnosis not present

## 2018-02-07 DIAGNOSIS — N2581 Secondary hyperparathyroidism of renal origin: Secondary | ICD-10-CM | POA: Diagnosis not present

## 2018-02-07 DIAGNOSIS — N186 End stage renal disease: Secondary | ICD-10-CM | POA: Diagnosis not present

## 2018-02-09 DIAGNOSIS — N2581 Secondary hyperparathyroidism of renal origin: Secondary | ICD-10-CM | POA: Diagnosis not present

## 2018-02-09 DIAGNOSIS — N186 End stage renal disease: Secondary | ICD-10-CM | POA: Diagnosis not present

## 2018-02-12 DIAGNOSIS — N2581 Secondary hyperparathyroidism of renal origin: Secondary | ICD-10-CM | POA: Diagnosis not present

## 2018-02-12 DIAGNOSIS — N186 End stage renal disease: Secondary | ICD-10-CM | POA: Diagnosis not present

## 2018-02-14 DIAGNOSIS — N186 End stage renal disease: Secondary | ICD-10-CM | POA: Diagnosis not present

## 2018-02-14 DIAGNOSIS — N2581 Secondary hyperparathyroidism of renal origin: Secondary | ICD-10-CM | POA: Diagnosis not present

## 2018-02-16 DIAGNOSIS — N186 End stage renal disease: Secondary | ICD-10-CM | POA: Diagnosis not present

## 2018-02-16 DIAGNOSIS — N2581 Secondary hyperparathyroidism of renal origin: Secondary | ICD-10-CM | POA: Diagnosis not present

## 2018-02-19 DIAGNOSIS — N186 End stage renal disease: Secondary | ICD-10-CM | POA: Diagnosis not present

## 2018-02-19 DIAGNOSIS — N2581 Secondary hyperparathyroidism of renal origin: Secondary | ICD-10-CM | POA: Diagnosis not present

## 2018-02-21 DIAGNOSIS — N186 End stage renal disease: Secondary | ICD-10-CM | POA: Diagnosis not present

## 2018-02-21 DIAGNOSIS — N2581 Secondary hyperparathyroidism of renal origin: Secondary | ICD-10-CM | POA: Diagnosis not present

## 2018-02-23 DIAGNOSIS — N2581 Secondary hyperparathyroidism of renal origin: Secondary | ICD-10-CM | POA: Diagnosis not present

## 2018-02-23 DIAGNOSIS — N186 End stage renal disease: Secondary | ICD-10-CM | POA: Diagnosis not present

## 2018-02-26 DIAGNOSIS — N186 End stage renal disease: Secondary | ICD-10-CM | POA: Diagnosis not present

## 2018-02-26 DIAGNOSIS — N2581 Secondary hyperparathyroidism of renal origin: Secondary | ICD-10-CM | POA: Diagnosis not present

## 2018-02-28 DIAGNOSIS — N2581 Secondary hyperparathyroidism of renal origin: Secondary | ICD-10-CM | POA: Diagnosis not present

## 2018-02-28 DIAGNOSIS — N186 End stage renal disease: Secondary | ICD-10-CM | POA: Diagnosis not present

## 2018-03-02 DIAGNOSIS — N186 End stage renal disease: Secondary | ICD-10-CM | POA: Diagnosis not present

## 2018-03-02 DIAGNOSIS — N2581 Secondary hyperparathyroidism of renal origin: Secondary | ICD-10-CM | POA: Diagnosis not present

## 2018-03-02 DIAGNOSIS — Z992 Dependence on renal dialysis: Secondary | ICD-10-CM | POA: Diagnosis not present

## 2018-03-02 DIAGNOSIS — E8779 Other fluid overload: Secondary | ICD-10-CM | POA: Diagnosis not present

## 2018-03-02 DIAGNOSIS — Q612 Polycystic kidney, adult type: Secondary | ICD-10-CM | POA: Diagnosis not present

## 2018-03-05 DIAGNOSIS — N186 End stage renal disease: Secondary | ICD-10-CM | POA: Diagnosis not present

## 2018-03-05 DIAGNOSIS — E8779 Other fluid overload: Secondary | ICD-10-CM | POA: Diagnosis not present

## 2018-03-05 DIAGNOSIS — N2581 Secondary hyperparathyroidism of renal origin: Secondary | ICD-10-CM | POA: Diagnosis not present

## 2018-03-07 DIAGNOSIS — N2581 Secondary hyperparathyroidism of renal origin: Secondary | ICD-10-CM | POA: Diagnosis not present

## 2018-03-07 DIAGNOSIS — E8779 Other fluid overload: Secondary | ICD-10-CM | POA: Diagnosis not present

## 2018-03-07 DIAGNOSIS — N186 End stage renal disease: Secondary | ICD-10-CM | POA: Diagnosis not present

## 2018-03-09 DIAGNOSIS — E8779 Other fluid overload: Secondary | ICD-10-CM | POA: Diagnosis not present

## 2018-03-09 DIAGNOSIS — N2581 Secondary hyperparathyroidism of renal origin: Secondary | ICD-10-CM | POA: Diagnosis not present

## 2018-03-09 DIAGNOSIS — N186 End stage renal disease: Secondary | ICD-10-CM | POA: Diagnosis not present

## 2018-03-12 DIAGNOSIS — N2581 Secondary hyperparathyroidism of renal origin: Secondary | ICD-10-CM | POA: Diagnosis not present

## 2018-03-12 DIAGNOSIS — E8779 Other fluid overload: Secondary | ICD-10-CM | POA: Diagnosis not present

## 2018-03-12 DIAGNOSIS — N186 End stage renal disease: Secondary | ICD-10-CM | POA: Diagnosis not present

## 2018-03-13 DIAGNOSIS — E8779 Other fluid overload: Secondary | ICD-10-CM | POA: Diagnosis not present

## 2018-03-13 DIAGNOSIS — N2581 Secondary hyperparathyroidism of renal origin: Secondary | ICD-10-CM | POA: Diagnosis not present

## 2018-03-13 DIAGNOSIS — N186 End stage renal disease: Secondary | ICD-10-CM | POA: Diagnosis not present

## 2018-03-16 DIAGNOSIS — E8779 Other fluid overload: Secondary | ICD-10-CM | POA: Diagnosis not present

## 2018-03-16 DIAGNOSIS — N2581 Secondary hyperparathyroidism of renal origin: Secondary | ICD-10-CM | POA: Diagnosis not present

## 2018-03-16 DIAGNOSIS — N186 End stage renal disease: Secondary | ICD-10-CM | POA: Diagnosis not present

## 2018-03-19 DIAGNOSIS — N186 End stage renal disease: Secondary | ICD-10-CM | POA: Diagnosis not present

## 2018-03-19 DIAGNOSIS — N2581 Secondary hyperparathyroidism of renal origin: Secondary | ICD-10-CM | POA: Diagnosis not present

## 2018-03-19 DIAGNOSIS — E8779 Other fluid overload: Secondary | ICD-10-CM | POA: Diagnosis not present

## 2018-03-21 DIAGNOSIS — E8779 Other fluid overload: Secondary | ICD-10-CM | POA: Diagnosis not present

## 2018-03-21 DIAGNOSIS — N2581 Secondary hyperparathyroidism of renal origin: Secondary | ICD-10-CM | POA: Diagnosis not present

## 2018-03-21 DIAGNOSIS — N186 End stage renal disease: Secondary | ICD-10-CM | POA: Diagnosis not present

## 2018-03-23 DIAGNOSIS — E8779 Other fluid overload: Secondary | ICD-10-CM | POA: Diagnosis not present

## 2018-03-23 DIAGNOSIS — N186 End stage renal disease: Secondary | ICD-10-CM | POA: Diagnosis not present

## 2018-03-23 DIAGNOSIS — N2581 Secondary hyperparathyroidism of renal origin: Secondary | ICD-10-CM | POA: Diagnosis not present

## 2018-03-25 DIAGNOSIS — E8779 Other fluid overload: Secondary | ICD-10-CM | POA: Diagnosis not present

## 2018-03-25 DIAGNOSIS — N186 End stage renal disease: Secondary | ICD-10-CM | POA: Diagnosis not present

## 2018-03-25 DIAGNOSIS — N2581 Secondary hyperparathyroidism of renal origin: Secondary | ICD-10-CM | POA: Diagnosis not present

## 2018-03-27 DIAGNOSIS — N186 End stage renal disease: Secondary | ICD-10-CM | POA: Diagnosis not present

## 2018-03-27 DIAGNOSIS — N2581 Secondary hyperparathyroidism of renal origin: Secondary | ICD-10-CM | POA: Diagnosis not present

## 2018-03-27 DIAGNOSIS — E8779 Other fluid overload: Secondary | ICD-10-CM | POA: Diagnosis not present

## 2018-03-30 DIAGNOSIS — N186 End stage renal disease: Secondary | ICD-10-CM | POA: Diagnosis not present

## 2018-03-30 DIAGNOSIS — E8779 Other fluid overload: Secondary | ICD-10-CM | POA: Diagnosis not present

## 2018-03-30 DIAGNOSIS — N2581 Secondary hyperparathyroidism of renal origin: Secondary | ICD-10-CM | POA: Diagnosis not present

## 2018-04-01 DIAGNOSIS — Q612 Polycystic kidney, adult type: Secondary | ICD-10-CM | POA: Diagnosis not present

## 2018-04-01 DIAGNOSIS — N186 End stage renal disease: Secondary | ICD-10-CM | POA: Diagnosis not present

## 2018-04-01 DIAGNOSIS — Z992 Dependence on renal dialysis: Secondary | ICD-10-CM | POA: Diagnosis not present

## 2018-04-02 DIAGNOSIS — N2581 Secondary hyperparathyroidism of renal origin: Secondary | ICD-10-CM | POA: Diagnosis not present

## 2018-04-02 DIAGNOSIS — N186 End stage renal disease: Secondary | ICD-10-CM | POA: Diagnosis not present

## 2018-04-04 DIAGNOSIS — N186 End stage renal disease: Secondary | ICD-10-CM | POA: Diagnosis not present

## 2018-04-04 DIAGNOSIS — N2581 Secondary hyperparathyroidism of renal origin: Secondary | ICD-10-CM | POA: Diagnosis not present

## 2018-04-07 DIAGNOSIS — N186 End stage renal disease: Secondary | ICD-10-CM | POA: Diagnosis not present

## 2018-04-07 DIAGNOSIS — N2581 Secondary hyperparathyroidism of renal origin: Secondary | ICD-10-CM | POA: Diagnosis not present

## 2018-04-09 DIAGNOSIS — N186 End stage renal disease: Secondary | ICD-10-CM | POA: Diagnosis not present

## 2018-04-09 DIAGNOSIS — N2581 Secondary hyperparathyroidism of renal origin: Secondary | ICD-10-CM | POA: Diagnosis not present

## 2018-04-11 DIAGNOSIS — N186 End stage renal disease: Secondary | ICD-10-CM | POA: Diagnosis not present

## 2018-04-11 DIAGNOSIS — N2581 Secondary hyperparathyroidism of renal origin: Secondary | ICD-10-CM | POA: Diagnosis not present

## 2018-04-13 DIAGNOSIS — H25013 Cortical age-related cataract, bilateral: Secondary | ICD-10-CM | POA: Diagnosis not present

## 2018-04-13 DIAGNOSIS — H34811 Central retinal vein occlusion, right eye, with macular edema: Secondary | ICD-10-CM | POA: Diagnosis not present

## 2018-04-13 DIAGNOSIS — N2581 Secondary hyperparathyroidism of renal origin: Secondary | ICD-10-CM | POA: Diagnosis not present

## 2018-04-13 DIAGNOSIS — B0052 Herpesviral keratitis: Secondary | ICD-10-CM | POA: Diagnosis not present

## 2018-04-13 DIAGNOSIS — H35033 Hypertensive retinopathy, bilateral: Secondary | ICD-10-CM | POA: Diagnosis not present

## 2018-04-13 DIAGNOSIS — H2513 Age-related nuclear cataract, bilateral: Secondary | ICD-10-CM | POA: Diagnosis not present

## 2018-04-13 DIAGNOSIS — N186 End stage renal disease: Secondary | ICD-10-CM | POA: Diagnosis not present

## 2018-04-16 DIAGNOSIS — N186 End stage renal disease: Secondary | ICD-10-CM | POA: Diagnosis not present

## 2018-04-16 DIAGNOSIS — N2581 Secondary hyperparathyroidism of renal origin: Secondary | ICD-10-CM | POA: Diagnosis not present

## 2018-04-18 DIAGNOSIS — N186 End stage renal disease: Secondary | ICD-10-CM | POA: Diagnosis not present

## 2018-04-18 DIAGNOSIS — N2581 Secondary hyperparathyroidism of renal origin: Secondary | ICD-10-CM | POA: Diagnosis not present

## 2018-04-20 DIAGNOSIS — N186 End stage renal disease: Secondary | ICD-10-CM | POA: Diagnosis not present

## 2018-04-20 DIAGNOSIS — N2581 Secondary hyperparathyroidism of renal origin: Secondary | ICD-10-CM | POA: Diagnosis not present

## 2018-04-22 DIAGNOSIS — N186 End stage renal disease: Secondary | ICD-10-CM | POA: Diagnosis not present

## 2018-04-22 DIAGNOSIS — N2581 Secondary hyperparathyroidism of renal origin: Secondary | ICD-10-CM | POA: Diagnosis not present

## 2018-04-24 DIAGNOSIS — N2581 Secondary hyperparathyroidism of renal origin: Secondary | ICD-10-CM | POA: Diagnosis not present

## 2018-04-24 DIAGNOSIS — N186 End stage renal disease: Secondary | ICD-10-CM | POA: Diagnosis not present

## 2018-04-27 DIAGNOSIS — N2581 Secondary hyperparathyroidism of renal origin: Secondary | ICD-10-CM | POA: Diagnosis not present

## 2018-04-27 DIAGNOSIS — N186 End stage renal disease: Secondary | ICD-10-CM | POA: Diagnosis not present

## 2018-04-29 DIAGNOSIS — N186 End stage renal disease: Secondary | ICD-10-CM | POA: Diagnosis not present

## 2018-04-29 DIAGNOSIS — N2581 Secondary hyperparathyroidism of renal origin: Secondary | ICD-10-CM | POA: Diagnosis not present

## 2018-04-30 ENCOUNTER — Encounter (INDEPENDENT_AMBULATORY_CARE_PROVIDER_SITE_OTHER): Payer: Medicare Other | Admitting: Ophthalmology

## 2018-05-01 ENCOUNTER — Encounter (INDEPENDENT_AMBULATORY_CARE_PROVIDER_SITE_OTHER): Payer: Medicare Other | Admitting: Ophthalmology

## 2018-05-01 DIAGNOSIS — N186 End stage renal disease: Secondary | ICD-10-CM | POA: Diagnosis not present

## 2018-05-01 DIAGNOSIS — N2581 Secondary hyperparathyroidism of renal origin: Secondary | ICD-10-CM | POA: Diagnosis not present

## 2018-05-02 DIAGNOSIS — N186 End stage renal disease: Secondary | ICD-10-CM | POA: Diagnosis not present

## 2018-05-02 DIAGNOSIS — Z992 Dependence on renal dialysis: Secondary | ICD-10-CM | POA: Diagnosis not present

## 2018-05-02 DIAGNOSIS — Q612 Polycystic kidney, adult type: Secondary | ICD-10-CM | POA: Diagnosis not present

## 2018-05-04 DIAGNOSIS — N2581 Secondary hyperparathyroidism of renal origin: Secondary | ICD-10-CM | POA: Diagnosis not present

## 2018-05-04 DIAGNOSIS — N186 End stage renal disease: Secondary | ICD-10-CM | POA: Diagnosis not present

## 2018-05-07 DIAGNOSIS — N2581 Secondary hyperparathyroidism of renal origin: Secondary | ICD-10-CM | POA: Diagnosis not present

## 2018-05-07 DIAGNOSIS — N186 End stage renal disease: Secondary | ICD-10-CM | POA: Diagnosis not present

## 2018-05-08 DIAGNOSIS — Z992 Dependence on renal dialysis: Secondary | ICD-10-CM | POA: Diagnosis not present

## 2018-05-08 DIAGNOSIS — I871 Compression of vein: Secondary | ICD-10-CM | POA: Diagnosis not present

## 2018-05-08 DIAGNOSIS — N186 End stage renal disease: Secondary | ICD-10-CM | POA: Diagnosis not present

## 2018-05-09 DIAGNOSIS — N186 End stage renal disease: Secondary | ICD-10-CM | POA: Diagnosis not present

## 2018-05-09 DIAGNOSIS — N2581 Secondary hyperparathyroidism of renal origin: Secondary | ICD-10-CM | POA: Diagnosis not present

## 2018-05-10 ENCOUNTER — Encounter (INDEPENDENT_AMBULATORY_CARE_PROVIDER_SITE_OTHER): Payer: Medicare Other | Admitting: Ophthalmology

## 2018-05-11 DIAGNOSIS — N186 End stage renal disease: Secondary | ICD-10-CM | POA: Diagnosis not present

## 2018-05-11 DIAGNOSIS — N2581 Secondary hyperparathyroidism of renal origin: Secondary | ICD-10-CM | POA: Diagnosis not present

## 2018-05-14 DIAGNOSIS — N2581 Secondary hyperparathyroidism of renal origin: Secondary | ICD-10-CM | POA: Diagnosis not present

## 2018-05-14 DIAGNOSIS — N186 End stage renal disease: Secondary | ICD-10-CM | POA: Diagnosis not present

## 2018-05-16 DIAGNOSIS — N186 End stage renal disease: Secondary | ICD-10-CM | POA: Diagnosis not present

## 2018-05-16 DIAGNOSIS — N2581 Secondary hyperparathyroidism of renal origin: Secondary | ICD-10-CM | POA: Diagnosis not present

## 2018-05-18 DIAGNOSIS — N2581 Secondary hyperparathyroidism of renal origin: Secondary | ICD-10-CM | POA: Diagnosis not present

## 2018-05-18 DIAGNOSIS — N186 End stage renal disease: Secondary | ICD-10-CM | POA: Diagnosis not present

## 2018-05-21 DIAGNOSIS — N2581 Secondary hyperparathyroidism of renal origin: Secondary | ICD-10-CM | POA: Diagnosis not present

## 2018-05-21 DIAGNOSIS — N186 End stage renal disease: Secondary | ICD-10-CM | POA: Diagnosis not present

## 2018-05-23 DIAGNOSIS — N2581 Secondary hyperparathyroidism of renal origin: Secondary | ICD-10-CM | POA: Diagnosis not present

## 2018-05-23 DIAGNOSIS — N186 End stage renal disease: Secondary | ICD-10-CM | POA: Diagnosis not present

## 2018-05-25 DIAGNOSIS — N186 End stage renal disease: Secondary | ICD-10-CM | POA: Diagnosis not present

## 2018-05-25 DIAGNOSIS — N2581 Secondary hyperparathyroidism of renal origin: Secondary | ICD-10-CM | POA: Diagnosis not present

## 2018-05-28 DIAGNOSIS — N186 End stage renal disease: Secondary | ICD-10-CM | POA: Diagnosis not present

## 2018-05-28 DIAGNOSIS — N2581 Secondary hyperparathyroidism of renal origin: Secondary | ICD-10-CM | POA: Diagnosis not present

## 2018-05-28 NOTE — Progress Notes (Signed)
[facility] Note  05/30/2018     CHIEF COMPLAINT Patient presents for Retina Follow Up   HISTORY OF PRESENT ILLNESS: Michael [name] is a 63 y.o. male who presents to the clinic today for:   HPI    Retina Follow Up    Patient presents with  Other.  In right eye.  This started 6 months ago.  Severity is moderate.  Duration of 4 months.  Since onset it is gradually improving.  I, the attending physician,  performed the HPI with the patient and updated documentation appropriately.          Comments    63 y/o male pt here for 4 mo f/u for dendritic herpes keratitis OD.  VA improved OD.  No change in [place] OS.  Denies pain, flashes, floaters, irritation, photophobia.  Acyclovir 400mg  BID PO, Pred QD OD.       Last edited by [name], MD on 05/30/2018  2:26 PM. (History)      Referring physician: [name], MD [place], [place] [address]  HISTORICAL INFORMATION:   Selected notes from the MEDICAL RECORD NUMBER Referred by Dr. [name]. Hecker for concern of HRVO OD; LEE- 11.30.18 ([name]) {BCVA OD: 20/100-1 OS: 20/20-2] Ocular Hx- cataract OU PMH- HTN, high chol, kidney disease, sleep apnea, emphysema    CURRENT MEDICATIONS: Current Outpatient Medications (Ophthalmic Drugs)  Medication Sig  . dorzolamide-timolol (COSOPT) 22.3-6.8 MG/ML ophthalmic solution Place 1 drop into both eyes 2 (two) times daily.  . Ganciclovir (ZIRGAN) 0.15 % GEL Place 1 drop into the right eye 5 (five) times daily.  . prednisoLONE acetate (PRED FORTE) 1 % ophthalmic suspension INSTILL 1 DROP INTO RIGHT EYE 4 TIMES DAILY   No current facility-administered medications for this visit.  (Ophthalmic Drugs)   Current Outpatient Medications (Other)  Medication Sig  . acyclovir (ZOVIRAX) 400 MG tablet   . aspirin EC 81 MG EC tablet Take 1 tablet (81 mg total) by mouth daily.  [name] atorvastatin (LIPITOR) 40 MG tablet Take 40 mg by mouth every evening.    . calcium acetate (PHOSLO) 667 MG capsule Take 1,334 mg by mouth 3 (three) times daily with meals.   . metoprolol succinate (TOPROL-XL) 100 MG 24 hr tablet Take 50 mg by mouth See admin instructions. Take 50 mg by mouth daily on Tuesday, Thursday and Saturday.  . nitroGLYCERIN (NITROSTAT) 0.4 MG SL tablet Place 1 tablet (0.4 mg total) under the tongue every 5 (five) minutes x 3 doses as needed for chest pain.  [name] RENVELA 800 MG tablet   . SENSIPAR 60 MG tablet Take 60 mg by mouth every evening.    Current Facility-Administered Medications (Other)  Medication Route  . Bevacizumab (AVASTIN) SOLN 1.25 mg Intravitreal  . Bevacizumab (AVASTIN) SOLN 1.25 mg Intravitreal  . Bevacizumab (AVASTIN) SOLN 1.25 mg Intravitreal  . Bevacizumab (AVASTIN) SOLN 1.25 mg Intravitreal  . Bevacizumab (AVASTIN) SOLN 1.25 mg Intravitreal      REVIEW OF SYSTEMS: ROS    Positive for: Cardiovascular, Eyes, Respiratory   Negative for: Constitutional, Gastrointestinal, Neurological, Skin, Genitourinary, Musculoskeletal, HENT, Endocrine, Psychiatric, Allergic/Imm, Heme/Lymph   Last edited by Matthew Folks, COA on 05/30/2018  2:11 PM. (History)       ALLERGIES No Known Allergies  PAST MEDICAL HISTORY Past Medical History:  Diagnosis Date  . Cataract   . Dyspnea    on exertion  . ESRD (end stage renal disease) ([place])  Hemo- MWF, Polycystic kidney disease  . Fatigue   . History of kidney stones    removal of stone- cysto  . Hyperlipidemia   . Hyperparathyroidism, secondary renal ([place])   . Hypertension   . Hypoxemia 12/12/2013  . Nonischemic cardiomyopathy ([place])    Er 25% 2015, 55 % 2013  . OSA on CPAP    no longer using cpap  . OSA on CPAP 03/24/2014  . Pneumonia    2015ish  . Ventricular tachycardia//Freq PVCs    Past Surgical History:  Procedure Laterality Date  . A/V FISTULAGRAM Left 04/27/2017   Procedure: A/V FISTULAGRAM;  Surgeon: Conrad Calvert, MD;  Location: [facility];   Service: Cardiovascular;  Laterality: Left;  lt arm  . APPENDECTOMY    . AV FISTULA PLACEMENT  12/05/2011   Procedure: ARTERIOVENOUS (AV) FISTULA CREATION;LLEFT ARM  Surgeon: Conrad Gilby, MD;  Location: [facility];  Service: Vascular;  Laterality: Left;  RADIO-CEPHALIC  fistula left arm  . AV FISTULA PLACEMENT  01/11/2012   Procedure: ARTERIOVENOUS (AV) FISTULA CREATION;  Surgeon: Conrad Northglenn, MD;  Location: [facility];  Service: Vascular;  Laterality: Left;  Creation of left brachial cephalic arteriovenous fistula  . BASCILIC VEIN TRANSPOSITION Left 12/27/2016   Procedure: BASILIC VEIN TRANSPOSITION LEFT UPPER ARM FIRST STAGE;  Surgeon: Conrad Shelly, MD;  Location: [facility];  Service: Vascular;  Laterality: Left;  . BASCILIC VEIN TRANSPOSITION Left 01/31/2017   Procedure: LEFT ARM BASILIC VEIN TRANSPOSITION, SECOND STAGE;  Surgeon: Conrad Buffalo, MD;  Location: [facility];  Service: Vascular;  Laterality: Left;  . CARDIAC CATHETERIZATION  04-05-2010   checking for blockage but none-[facility]  . COLONOSCOPY    . CYSTOSCOPY W/ STONE MANIPULATION     "laser once" (01/22/2013)  . HEMATOMA EVACUATION Left 05/09/2017   Procedure: EVACUATION HEMATOMA LEFT ARM;  Surgeon: Conrad Visalia, MD;  Location: [facility];  Service: Vascular;  Laterality: Left;  . HERNIA REPAIR    . INGUINAL HERNIA REPAIR Right 11/06/2015   Procedure: OPEN HERNIA REPAIR  RIGHT INGUINAL ADULT;  Surgeon: [name], MD;  Location: [facility];  Service: General;  Laterality: Right;  with MESH  . INSERTION OF DIALYSIS CATHETER Right 10/05/2016   Procedure: INSERTION OF right internal jugular DIALYSIS CATHETER;  Surgeon: [name], MD;  Location: [facility];  Service: Vascular;  Laterality: Right;  . INSERTION OF MESH  03/20/2012   Procedure: INSERTION OF MESH;  UMB Surgeon: [name], MD;  Location: [facility];  Service: General;  Laterality: N/A;  . INSERTION OF MESH N/A 01/22/2013   Procedure: INSERTION OF MESH;  Surgeon: [name], MD;  Location: [facility];   Service: General;  Laterality: N/A;  . LAPAROTOMY  04/02/2012   Procedure: EXPLORATORY LAPAROTOMY;  Surgeon: [name], MD;  Location: [facility];  Service: General;  Laterality: N/A;  Exploratory Laparotomy with resection of small intestine  . LEFT HEART CATHETERIZATION WITH CORONARY ANGIOGRAM N/A 05/14/2013   Procedure: LEFT HEART CATHETERIZATION WITH CORONARY ANGIOGRAM;  Surgeon: Sinclair Grooms, MD;  Location: [facility];  Service: Cardiovascular;  Laterality: N/A;  . LIGATION OF ARTERIOVENOUS  FISTULA Left 12/27/2016   Procedure: LIGATION/EXCISION OF LEFT UPPER ARM ARTERIOVENOUS  FISTULA;  EVACUATION OF HEMATOMA;  Surgeon: Conrad Strathmere, MD;  Location: [facility];  Service: Vascular;  Laterality: Left;  . REVISON OF ARTERIOVENOUS FISTULA Left 10/05/2016   Procedure: REVISON OF left arm ARTERIOVENOUS FISTULA;  Surgeon: [name],  MD;  Location: [facility];  Service: Vascular;  Laterality: Left;  . TONSILLECTOMY    . UMBILICAL HERNIA REPAIR  03/20/2012   Procedure: HERNIA REPAIR UMBILICAL ADULT;  Surgeon: [name], MD;  Location: [facility];  Service: General;  Laterality: N/A;  . UMBILICAL HERNIA REPAIR  01/22/2013   preperitoneal open procedure due to significant adhesions/notes 01/22/2013  . VENTRAL HERNIA REPAIR N/A 01/22/2013   Procedure: ATTEMPTED LAPAROSCOPIC VENTRAL HERNIA CONVERTED TO OPEN;  Surgeon: [name], MD;  Location[facility];  Service: General;  Laterality: N/A;    FAMILY HISTORY Family History  Problem Relation Age of Onset  . Heart disease Mother   . Hyperlipidemia Mother   . Hypertension Mother   . Kidney disease Father   . Stroke Father   . Kidney disease Brother   . Amblyopia Neg Hx   . Blindness Neg Hx   . Cataracts Neg Hx   . Diabetes Neg Hx   . Glaucoma Neg Hx   . Macular degeneration Neg Hx   . Retinal detachment Neg Hx   . Strabismus Neg Hx   . Retinitis pigmentosa Neg Hx     SOCIAL HISTORY Social History   Tobacco Use  . Smoking status:  Never Smoker  . Smokeless tobacco: Never Used  Substance Use Topics  . Alcohol use: No    Alcohol/week: 0.0 standard drinks  . Drug use: No         OPHTHALMIC EXAM:  Base Eye Exam    Visual Acuity (Snellen - Linear)      Right Left   Dist cc 20/25 -2 20/15 -2   Dist ph cc NI    Correction:  Glasses       Tonometry (Tonopen, 2:13 PM)      Right Left   Pressure 24 20       Tonometry #2 (Applanation, 2:41 PM)      Right Left   Pressure 22 22       Pupils      Dark Light Shape React APD   Right 4.5 3.5 Round Brisk None   Left 4.5 3.5 Round Brisk None       Visual Fields (Counting fingers)      Left Right    Full Full       Extraocular Movement      Right Left    Full, Ortho Full, Ortho       Neuro/Psych    Oriented x3:  Yes   Mood/Affect:  Normal       Dilation    Both eyes:  1.0% Mydriacyl, 2.5% Phenylephrine @ 2:13 PM        Slit Lamp and Fundus Exam    Slit Lamp Exam      Right Left   Lids/Lashes Dermatochalasis - upper lid Dermatochalasis - upper lid   Conjunctiva/Sclera Mild Melanosis, Nasal Pinguecula, trace-1+ Injection Mild Melanosis, nasal and temporal Pinguecula   Cornea Arcus, 1+ Punctate epithelial erosions; no dendrites Arcus   Anterior Chamber Deep and quiet, no cell or flare Deep and quiet   Iris Round and dilated Round and dilated   Lens 2+ Nuclear sclerosis, 2+ Cortical cataract 2+ Nuclear sclerosis, 2+ Cortical cataract   Vitreous Vitreous syneresis, no cell, +Posterior vitreous detachment Vitreous syneresis, Posterior vitreous detachment       Fundus Exam      Right Left   Disc vascular loops, Pink and Sharp Normal   C/D Ratio 0.5 0.6   Macula  Blunted foveal reflex, RPE mottling, No heme or edema Good foveal reflex, Retinal pigment epithelial mottling, No heme or edema   Vessels Vascular attenuation, dilated and Tortuous venules superiorly, Copper wiring superiorly, AV crossing changes Mild Copper wiring, mildly Tortuous, AV  crossing changes   Periphery Attached, DBH superiorly and temporally improved, pigmented Chorioretinal scar at 1030 Attached, pigmented lattice with 3 atrophic holes at 1030 ora - good laser surrounding          IMAGING AND PROCEDURES  Imaging and Procedures for 08/31/17  OCT, Retina - OU - Both Eyes       Right Eye Quality was good. Central Foveal Thickness: 248. Progression has been stable. Findings include normal foveal contour, outer retinal atrophy, no SRF, no IRF, vitreomacular adhesion  (Focal disruption of ellipsoid central fovea).   Left Eye Quality was good. Central Foveal Thickness: 271. Progression has been stable. Findings include normal foveal contour, no IRF, no SRF, epiretinal membrane, vitreomacular adhesion .   Notes *Images captured and stored on drive  Diagnosis / Impression:  OD: NFP, No IRF/SRF, stable residual ellipsoid thinning OS: trace ERM  Clinical management:  See below  Abbreviations: NFP - Normal foveal profile. CME - cystoid macular edema. PED - pigment epithelial detachment. IRF - intraretinal fluid. SRF - subretinal fluid. EZ - ellipsoid zone. ERM - epiretinal membrane. ORA - outer retinal atrophy. ORT - outer retinal tubulation. SRHM - subretinal hyper-reflective material                  ASSESSMENT/PLAN:    ICD-10-CM   1. Herpes keratitis B00.52 CANCELED: Intravitreal Injection, Pharmacologic Agent - OD - Right Eye  2. Branch retinal vein occlusion of right eye with macular edema H34.8310   3. Retinal edema H35.81 OCT, Retina - OU - Both Eyes  4. Lattice degeneration of left retina H35.412   5. Atrophic retinal break, multiple, left eye H33.332   6. Posterior vitreous detachment of both eyes H43.813   7. Combined forms of age-related cataract of both eyes H25.813   8. Branch retinal vein occlusion of left eye with macular edema H34.8320   9. Acute anterior uveitis of right eye H20.00    1. Dendritic herpes keratitis OD -  likely etiology of prior episodes of Anterior Uveitis OD- now improved - FA on 3.18.19 without posterior involvement, vasculitis or leakage OD - improvement in conj injection, corneal edema, AC cell and KPs - IOP 22 OU today  - re-start Cosopt BID OU - epithelial dendrite remains resolved today (on po acyclovir; off topical trifluridine) - now under the expert management of Dr. Kathlen Mody - continue po acyclovir, 400 mg BID per Dr. Kathlen Mody - stop Pred Forte (anterior uveitis remains resolved at this time) - F/U 6-8 weeks, DFE, OCT -- IOP check  2,3. BRVO w/ CME OD - s/p IVA OD #1 (12.21.18), #2 (01.18.19), #3 (02.14.19), #4 (04.02.19), #5 (05.02.19) - OCT today with interval improvement in IRF despite no antiVEGF therapy since May 2019 -- resolved - will continue to hold injection today   4,5. Lattice degeneration with atrophic holes OS-  - peripheral holes superiorly at 1030 without SRF or RD - S/P laser retinopexy OS (01.04.19) - good laser in place -- stable  6. PVD / vitreous syneresis OU  Discussed findings and prognosis  No RT or RD on 360 scleral depressed exam  Reviewed s/s of RT/RD  Strict return precautions for any such RT/RD signs/symptoms  7. Combined forms age-related cataract OU-  -  The symptoms of cataract, surgical options, and treatments and risks were discussed with patient. - discussed diagnosis and progression - not yet visually significant - monitor for now   Ophthalmic Meds Ordered this visit:  No orders of the defined types were placed in this encounter.      Return for f/u 6-8 weeks, IOP check.  There are no Patient Instructions on file for this visit.   Explained the diagnoses, plan, and follow up with the patient and they expressed understanding.  Patient expressed understanding of the importance of proper follow up care.   This document serves as a record of services personally performed by Gardiner Sleeper, MD, PhD. It was created on their behalf  by Ernest Mallick, OA, an ophthalmic assistant. The creation of this record is the provider's dictation and/or activities during the visit.    Electronically signed by: Ernest Mallick, OA  01.27.2020 11:46 PM    Gardiner Sleeper, M.D., Ph.D. Diseases & Surgery of the Retina and Vitreous [facility]   I have reviewed the above documentation for accuracy and completeness, and I agree with the above. Gardiner Sleeper, M.D., Ph.D. 05/30/18 11:47 PM    Abbreviations: M myopia (nearsighted); A astigmatism; H hyperopia (farsighted); P presbyopia; Mrx spectacle prescription;  CTL contact lenses; OD right eye; OS left eye; OU both eyes  XT exotropia; ET esotropia; PEK punctate epithelial keratitis; PEE punctate epithelial erosions; DES dry eye syndrome; MGD meibomian gland dysfunction; ATs artificial tears; PFAT's preservative free artificial tears; [place]illian nuclear sclerotic cataract; PSC posterior subcapsular cataract; ERM epi-retinal membrane; PVD posterior vitreous detachment; RD retinal detachment; DM diabetes mellitus; DR diabetic retinopathy; NPDR non-proliferative diabetic retinopathy; PDR proliferative diabetic retinopathy; CSME clinically significant macular edema; DME diabetic macular edema; dbh dot blot hemorrhages; CWS cotton wool spot; POAG primary open angle glaucoma; C/D cup-to-disc ratio; HVF humphrey visual field; GVF goldmann visual field; OCT optical coherence tomography; IOP intraocular pressure; BRVO Branch retinal vein occlusion; CRVO central retinal vein occlusion; CRAO central retinal artery occlusion; BRAO branch retinal artery occlusion; RT retinal tear; SB scleral buckle; PPV pars plana vitrectomy; VH Vitreous hemorrhage; PRP panretinal laser photocoagulation; IVK intravitreal kenalog; VMT vitreomacular traction; MH Macular hole;  NVD neovascularization of the disc; NVE neovascularization elsewhere; AREDS age related eye disease study; ARMD age related macular  degeneration; POAG primary open angle glaucoma; EBMD epithelial/anterior basement membrane dystrophy; ACIOL anterior chamber intraocular lens; IOL intraocular lens; PCIOL posterior chamber intraocular lens; Phaco/IOL phacoemulsification with intraocular lens placement; Stebbins photorefractive keratectomy; LASIK laser assisted in situ keratomileusis; HTN hypertension; DM diabetes mellitus; COPD chronic obstructive pulmonary disease

## 2018-05-30 ENCOUNTER — Encounter (INDEPENDENT_AMBULATORY_CARE_PROVIDER_SITE_OTHER): Payer: Self-pay | Admitting: Ophthalmology

## 2018-05-30 ENCOUNTER — Ambulatory Visit (INDEPENDENT_AMBULATORY_CARE_PROVIDER_SITE_OTHER): Payer: Medicare Other | Admitting: Ophthalmology

## 2018-05-30 DIAGNOSIS — B0052 Herpesviral keratitis: Secondary | ICD-10-CM

## 2018-05-30 DIAGNOSIS — H35412 Lattice degeneration of retina, left eye: Secondary | ICD-10-CM

## 2018-05-30 DIAGNOSIS — N186 End stage renal disease: Secondary | ICD-10-CM | POA: Diagnosis not present

## 2018-05-30 DIAGNOSIS — H43813 Vitreous degeneration, bilateral: Secondary | ICD-10-CM | POA: Diagnosis not present

## 2018-05-30 DIAGNOSIS — H25813 Combined forms of age-related cataract, bilateral: Secondary | ICD-10-CM | POA: Diagnosis not present

## 2018-05-30 DIAGNOSIS — H34831 Tributary (branch) retinal vein occlusion, right eye, with macular edema: Secondary | ICD-10-CM | POA: Diagnosis not present

## 2018-05-30 DIAGNOSIS — N2581 Secondary hyperparathyroidism of renal origin: Secondary | ICD-10-CM | POA: Diagnosis not present

## 2018-05-30 DIAGNOSIS — H3581 Retinal edema: Secondary | ICD-10-CM | POA: Diagnosis not present

## 2018-05-30 DIAGNOSIS — H33332 Multiple defects of retina without detachment, left eye: Secondary | ICD-10-CM

## 2018-06-01 DIAGNOSIS — N2581 Secondary hyperparathyroidism of renal origin: Secondary | ICD-10-CM | POA: Diagnosis not present

## 2018-06-01 DIAGNOSIS — N186 End stage renal disease: Secondary | ICD-10-CM | POA: Diagnosis not present

## 2018-06-02 DIAGNOSIS — N186 End stage renal disease: Secondary | ICD-10-CM | POA: Diagnosis not present

## 2018-06-02 DIAGNOSIS — Q612 Polycystic kidney, adult type: Secondary | ICD-10-CM | POA: Diagnosis not present

## 2018-06-02 DIAGNOSIS — Z992 Dependence on renal dialysis: Secondary | ICD-10-CM | POA: Diagnosis not present

## 2018-06-04 DIAGNOSIS — N186 End stage renal disease: Secondary | ICD-10-CM | POA: Diagnosis not present

## 2018-06-04 DIAGNOSIS — N2581 Secondary hyperparathyroidism of renal origin: Secondary | ICD-10-CM | POA: Diagnosis not present

## 2018-06-07 DIAGNOSIS — N186 End stage renal disease: Secondary | ICD-10-CM | POA: Diagnosis not present

## 2018-06-07 DIAGNOSIS — N2581 Secondary hyperparathyroidism of renal origin: Secondary | ICD-10-CM | POA: Diagnosis not present

## 2018-06-08 DIAGNOSIS — N2581 Secondary hyperparathyroidism of renal origin: Secondary | ICD-10-CM | POA: Diagnosis not present

## 2018-06-08 DIAGNOSIS — N186 End stage renal disease: Secondary | ICD-10-CM | POA: Diagnosis not present

## 2018-06-11 DIAGNOSIS — N186 End stage renal disease: Secondary | ICD-10-CM | POA: Diagnosis not present

## 2018-06-11 DIAGNOSIS — N2581 Secondary hyperparathyroidism of renal origin: Secondary | ICD-10-CM | POA: Diagnosis not present

## 2018-06-13 DIAGNOSIS — N2581 Secondary hyperparathyroidism of renal origin: Secondary | ICD-10-CM | POA: Diagnosis not present

## 2018-06-13 DIAGNOSIS — N186 End stage renal disease: Secondary | ICD-10-CM | POA: Diagnosis not present

## 2018-06-15 DIAGNOSIS — N2581 Secondary hyperparathyroidism of renal origin: Secondary | ICD-10-CM | POA: Diagnosis not present

## 2018-06-15 DIAGNOSIS — N186 End stage renal disease: Secondary | ICD-10-CM | POA: Diagnosis not present

## 2018-06-18 DIAGNOSIS — N186 End stage renal disease: Secondary | ICD-10-CM | POA: Diagnosis not present

## 2018-06-18 DIAGNOSIS — N2581 Secondary hyperparathyroidism of renal origin: Secondary | ICD-10-CM | POA: Diagnosis not present

## 2018-06-20 DIAGNOSIS — N2581 Secondary hyperparathyroidism of renal origin: Secondary | ICD-10-CM | POA: Diagnosis not present

## 2018-06-20 DIAGNOSIS — N186 End stage renal disease: Secondary | ICD-10-CM | POA: Diagnosis not present

## 2018-06-22 DIAGNOSIS — N2581 Secondary hyperparathyroidism of renal origin: Secondary | ICD-10-CM | POA: Diagnosis not present

## 2018-06-22 DIAGNOSIS — N186 End stage renal disease: Secondary | ICD-10-CM | POA: Diagnosis not present

## 2018-06-25 DIAGNOSIS — N186 End stage renal disease: Secondary | ICD-10-CM | POA: Diagnosis not present

## 2018-06-25 DIAGNOSIS — N2581 Secondary hyperparathyroidism of renal origin: Secondary | ICD-10-CM | POA: Diagnosis not present

## 2018-06-27 DIAGNOSIS — N2581 Secondary hyperparathyroidism of renal origin: Secondary | ICD-10-CM | POA: Diagnosis not present

## 2018-06-27 DIAGNOSIS — N186 End stage renal disease: Secondary | ICD-10-CM | POA: Diagnosis not present

## 2018-06-29 DIAGNOSIS — N186 End stage renal disease: Secondary | ICD-10-CM | POA: Diagnosis not present

## 2018-06-29 DIAGNOSIS — N2581 Secondary hyperparathyroidism of renal origin: Secondary | ICD-10-CM | POA: Diagnosis not present

## 2018-07-01 DIAGNOSIS — N186 End stage renal disease: Secondary | ICD-10-CM | POA: Diagnosis not present

## 2018-07-01 DIAGNOSIS — Q612 Polycystic kidney, adult type: Secondary | ICD-10-CM | POA: Diagnosis not present

## 2018-07-01 DIAGNOSIS — Z992 Dependence on renal dialysis: Secondary | ICD-10-CM | POA: Diagnosis not present

## 2018-07-02 DIAGNOSIS — N2581 Secondary hyperparathyroidism of renal origin: Secondary | ICD-10-CM | POA: Diagnosis not present

## 2018-07-02 DIAGNOSIS — N186 End stage renal disease: Secondary | ICD-10-CM | POA: Diagnosis not present

## 2018-07-03 DIAGNOSIS — N186 End stage renal disease: Secondary | ICD-10-CM | POA: Diagnosis not present

## 2018-07-03 DIAGNOSIS — E8779 Other fluid overload: Secondary | ICD-10-CM | POA: Diagnosis not present

## 2018-07-03 DIAGNOSIS — N2581 Secondary hyperparathyroidism of renal origin: Secondary | ICD-10-CM | POA: Diagnosis not present

## 2018-07-04 DIAGNOSIS — N2581 Secondary hyperparathyroidism of renal origin: Secondary | ICD-10-CM | POA: Diagnosis not present

## 2018-07-04 DIAGNOSIS — N186 End stage renal disease: Secondary | ICD-10-CM | POA: Diagnosis not present

## 2018-07-06 DIAGNOSIS — N2581 Secondary hyperparathyroidism of renal origin: Secondary | ICD-10-CM | POA: Diagnosis not present

## 2018-07-06 DIAGNOSIS — N186 End stage renal disease: Secondary | ICD-10-CM | POA: Diagnosis not present

## 2018-07-09 DIAGNOSIS — N2581 Secondary hyperparathyroidism of renal origin: Secondary | ICD-10-CM | POA: Diagnosis not present

## 2018-07-09 DIAGNOSIS — N186 End stage renal disease: Secondary | ICD-10-CM | POA: Diagnosis not present

## 2018-07-09 NOTE — Progress Notes (Signed)
[facility] Note  07/11/2018     CHIEF COMPLAINT Patient presents for Eye Pain   HISTORY OF PRESENT ILLNESS: Michael [name] is a 63 y.o. male who presents to the clinic today for:   HPI    Eye Pain      In both eyes.  Pain was noted as 0/10.  Since onset it is gradually improving.  Response to treatment was significant improvement.  I, the attending physician,  performed the HPI with the patient and updated documentation appropriately.          Comments    Patient states vision is the same.  He has not had any eye pain or anymore light sensitivity.       Last edited by [name], MD on 07/11/2018  1:19 PM. (History)    pt states he has not been on a pressure drop, but he has been taking the oral medication, he states last week his right eye started hurting and was sensitive to light  Referring physician: [name], MD [place], [place] [address]  HISTORICAL INFORMATION:   Selected notes from the [facility] Referred by Dr. [name]. Hecker for concern of HRVO OD; LEE- 11.30.18 ([name]) {BCVA OD: 20/100-1 OS: 20/20-2] Ocular Hx- cataract OU PMH- HTN, high chol, kidney disease, sleep apnea, emphysema    CURRENT MEDICATIONS: Current Outpatient Medications (Ophthalmic Drugs)  Medication Sig  . dorzolamide-timolol (COSOPT) 22.3-6.8 MG/ML ophthalmic solution Place 1 drop into both eyes 2 (two) times daily.  . Ganciclovir (ZIRGAN) 0.15 % GEL Place 1 drop into the right eye 5 (five) times daily.  . prednisoLONE acetate (PRED FORTE) 1 % ophthalmic suspension Place 1 drop into the right eye 4 (four) times daily.   No current facility-administered medications for this visit.  (Ophthalmic Drugs)   Current Outpatient Medications (Other)  Medication Sig  . acyclovir (ZOVIRAX) 400 MG tablet   . aspirin EC 81 MG EC tablet Take 1 tablet (81 mg total) by mouth daily.  [name] atorvastatin (LIPITOR) 40 MG tablet Take 40 mg  by mouth every evening.   . calcium acetate (PHOSLO) 667 MG capsule Take 1,334 mg by mouth 3 (three) times daily with meals.   . metoprolol succinate (TOPROL-XL) 100 MG 24 hr tablet Take 50 mg by mouth See admin instructions. Take 50 mg by mouth daily on Tuesday, Thursday and Saturday.  . nitroGLYCERIN (NITROSTAT) 0.4 MG SL tablet Place 1 tablet (0.4 mg total) under the tongue every 5 (five) minutes x 3 doses as needed for chest pain.  [name] RENVELA 800 MG tablet   . SENSIPAR 60 MG tablet Take 60 mg by mouth every evening.    Current Facility-Administered Medications (Other)  Medication Route  . Bevacizumab (AVASTIN) SOLN 1.25 mg Intravitreal  . Bevacizumab (AVASTIN) SOLN 1.25 mg Intravitreal  . Bevacizumab (AVASTIN) SOLN 1.25 mg Intravitreal  . Bevacizumab (AVASTIN) SOLN 1.25 mg Intravitreal  . Bevacizumab (AVASTIN) SOLN 1.25 mg Intravitreal      REVIEW OF SYSTEMS: ROS    Positive for: Endocrine, Cardiovascular, Eyes, Respiratory   Negative for: Constitutional, Gastrointestinal, Neurological, Skin, Genitourinary, Musculoskeletal, HENT, Psychiatric, Allergic/Imm, Heme/Lymph   Last edited by Doneen Poisson on 07/11/2018 12:56 PM. (History)       ALLERGIES No Known Allergies  PAST MEDICAL HISTORY Past Medical History:  Diagnosis Date  . Cataract   . Dyspnea    on exertion  . ESRD (end stage renal  disease) ([place])    Hemo- MWF, Polycystic kidney disease  . Fatigue   . History of kidney stones    removal of stone- cysto  . Hyperlipidemia   . Hyperparathyroidism, secondary renal ([place])   . Hypertension   . Hypoxemia 12/12/2013  . Nonischemic cardiomyopathy ([place])    Er 25% 2015, 55 % 2013  . OSA on CPAP    no longer using cpap  . OSA on CPAP 03/24/2014  . Pneumonia    2015ish  . Ventricular tachycardia//Freq PVCs    Past Surgical History:  Procedure Laterality Date  . A/V FISTULAGRAM Left 04/27/2017   Procedure: A/V FISTULAGRAM;  Surgeon: Conrad Woodhaven, MD;  Location: [facility];  Service: Cardiovascular;  Laterality: Left;  lt arm  . APPENDECTOMY    . AV FISTULA PLACEMENT  12/05/2011   Procedure: ARTERIOVENOUS (AV) FISTULA CREATION;LLEFT ARM  Surgeon: Conrad Geraldine, MD;  Location: [facility];  Service: Vascular;  Laterality: Left;  RADIO-CEPHALIC  fistula left arm  . AV FISTULA PLACEMENT  01/11/2012   Procedure: ARTERIOVENOUS (AV) FISTULA CREATION;  Surgeon: Conrad [place] Agency, MD;  Location: [facility];  Service: Vascular;  Laterality: Left;  Creation of left brachial cephalic arteriovenous fistula  . BASCILIC VEIN TRANSPOSITION Left 12/27/2016   Procedure: BASILIC VEIN TRANSPOSITION LEFT UPPER ARM FIRST STAGE;  Surgeon: Conrad Trophy Club, MD;  Location: [facility];  Service: Vascular;  Laterality: Left;  . BASCILIC VEIN TRANSPOSITION Left 01/31/2017   Procedure: LEFT ARM BASILIC VEIN TRANSPOSITION, SECOND STAGE;  Surgeon: Conrad Gunnison, MD;  Location: [facility];  Service: Vascular;  Laterality: Left;  . CARDIAC CATHETERIZATION  04-05-2010   checking for blockage but none-[facility]  . COLONOSCOPY    . CYSTOSCOPY W/ STONE MANIPULATION     "laser once" (01/22/2013)  . HEMATOMA EVACUATION Left 05/09/2017   Procedure: EVACUATION HEMATOMA LEFT ARM;  Surgeon: Conrad Kountze, MD;  Location: [facility];  Service: Vascular;  Laterality: Left;  . HERNIA REPAIR    . INGUINAL HERNIA REPAIR Right 11/06/2015   Procedure: OPEN HERNIA REPAIR  RIGHT INGUINAL ADULT;  Surgeon: [name], MD;  Location: [facility];  Service: General;  Laterality: Right;  with MESH  . INSERTION OF DIALYSIS CATHETER Right 10/05/2016   Procedure: INSERTION OF right internal jugular DIALYSIS CATHETER;  Surgeon: [name], MD;  Location: [facility];  Service: Vascular;  Laterality: Right;  . INSERTION OF MESH  03/20/2012   Procedure: INSERTION OF MESH;  UMB Surgeon: [name], MD;  Location: [facility];  Service: General;  Laterality: N/A;  . INSERTION OF MESH N/A 01/22/2013   Procedure: INSERTION OF MESH;  Surgeon: [name], MD;   Location: [facility];  Service: General;  Laterality: N/A;  . LAPAROTOMY  04/02/2012   Procedure: EXPLORATORY LAPAROTOMY;  Surgeon: [name], MD;  Location: [facility];  Service: General;  Laterality: N/A;  Exploratory Laparotomy with resection of small intestine  . LEFT HEART CATHETERIZATION WITH CORONARY ANGIOGRAM N/A 05/14/2013   Procedure: LEFT HEART CATHETERIZATION WITH CORONARY ANGIOGRAM;  Surgeon: Sinclair Grooms, MD;  Location: [facility];  Service: Cardiovascular;  Laterality: N/A;  . LIGATION OF ARTERIOVENOUS  FISTULA Left 12/27/2016   Procedure: LIGATION/EXCISION OF LEFT UPPER ARM ARTERIOVENOUS  FISTULA;  EVACUATION OF HEMATOMA;  Surgeon: Conrad McLennan, MD;  Location: [facility];  Service: Vascular;  Laterality: Left;  . REVISON OF ARTERIOVENOUS FISTULA Left 10/05/2016   Procedure: REVISON OF left arm ARTERIOVENOUS FISTULA;  Surgeon: [name], MD;  Location: [facility];  Service: Vascular;  Laterality: Left;  . TONSILLECTOMY    . UMBILICAL HERNIA REPAIR  03/20/2012   Procedure: HERNIA REPAIR UMBILICAL ADULT;  Surgeon: [name], MD;  Location: [facility];  Service: General;  Laterality: N/A;  . UMBILICAL HERNIA REPAIR  01/22/2013   preperitoneal open procedure due to significant adhesions/notes 01/22/2013  . VENTRAL HERNIA REPAIR N/A 01/22/2013   Procedure: ATTEMPTED LAPAROSCOPIC VENTRAL HERNIA CONVERTED TO OPEN;  Surgeon: [name], MD;  [facility];  Service: General;  Laterality: N/A;    FAMILY HISTORY Family History  Problem Relation Age of Onset  . Heart disease Mother   . Hyperlipidemia Mother   . Hypertension Mother   . Kidney disease Father   . Stroke Father   . Kidney disease Brother   . Amblyopia Neg Hx   . Blindness Neg Hx   . Cataracts Neg Hx   . Diabetes Neg Hx   . Glaucoma Neg Hx   . Macular degeneration Neg Hx   . Retinal detachment Neg Hx   . Strabismus Neg Hx   . Retinitis pigmentosa Neg Hx     SOCIAL HISTORY Social History   Tobacco Use  .  Smoking status: Never Smoker  . Smokeless tobacco: Never Used  Substance Use Topics  . Alcohol use: No    Alcohol/week: 0.0 standard drinks  . Drug use: No         OPHTHALMIC EXAM:  Base Eye Exam    Visual Acuity (Snellen - Linear)      Right Left   Dist [place] 20/25 -1 20/20   Dist ph  NI    Correction:  Glasses       Tonometry (Tonopen, 12:57 PM)      Right Left   Pressure 19 23       Pupils      Pupils Dark Light Shape React   Right PERRL 4 3 Round 1+   Left PERRL 4 3 Round 1+       Neuro/Psych    Oriented x3:  Yes   Mood/Affect:  Normal       Dilation    Both eyes:  1.0% Mydriacyl, 2.5% Phenylephrine @ 12:57 PM        Slit Lamp and Fundus Exam    Slit Lamp Exam      Right Left   Lids/Lashes Dermatochalasis - upper lid Dermatochalasis - upper lid   Conjunctiva/Sclera Mild Melanosis, Nasal Pinguecula, 1+ Injection Mild Melanosis, nasal and temporal Pinguecula   Cornea Arcus, 1+ Punctate epithelial erosions; no dendrites, patchy corneal haze, trace endo pigment, trace stellate KP Arcus   Anterior Chamber Deep and quiet, 1/2+cell/pigment Deep and quiet   Iris Round and dilated Round and dilated   Lens 2+ Nuclear sclerosis, 2+ Cortical cataract 2+ Nuclear sclerosis, 2+ Cortical cataract   Vitreous Vitreous syneresis, no cell, +Posterior vitreous detachment Vitreous syneresis, Posterior vitreous detachment       Fundus Exam      Right Left   Disc vascular loops, Pink and Sharp Normal   C/D Ratio 0.5 0.6   Macula Blunted foveal reflex, RPE mottling, No heme or edema Good foveal reflex, Retinal pigment epithelial mottling, No heme or edema   Vessels Vascular attenuation, dilated and Tortuous venules superiorly, Copper wiring superiorly, AV crossing changes Mild Copper wiring, mildly Tortuous, AV crossing changes   Periphery Attached, DBH superiorly and temporally improved, pigmented Chorioretinal scar at 1030  Attached, pigmented lattice with 3 atrophic holes at  1030 ora - good laser surrounding          IMAGING AND PROCEDURES  Imaging and Procedures for 08/31/17  OCT, Retina - OU - Both Eyes       Right Eye Quality was good. Central Foveal Thickness: 245. Progression has been stable. Findings include normal foveal contour, outer retinal atrophy, no SRF, no IRF, vitreomacular adhesion  (Focal disruption of ellipsoid central fovea).   Left Eye Quality was good. Central Foveal Thickness: 274. Progression has been stable. Findings include normal foveal contour, no IRF, no SRF, epiretinal membrane, vitreomacular adhesion .   Notes *Images captured and stored on drive  Diagnosis / Impression:  OD: NFP, No IRF/SRF, stable residual ellipsoid thinning OS: trace ERM  Clinical management:  See below  Abbreviations: NFP - Normal foveal profile. CME - cystoid macular edema. PED - pigment epithelial detachment. IRF - intraretinal fluid. SRF - subretinal fluid. EZ - ellipsoid zone. ERM - epiretinal membrane. ORA - outer retinal atrophy. ORT - outer retinal tubulation. SRHM - subretinal hyper-reflective material                  ASSESSMENT/PLAN:    ICD-10-CM   1. Herpes keratitis B00.52 CANCELED: Intravitreal Injection, Pharmacologic Agent - OD - Right Eye  2. 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 Branch retinal vein occlusion of right eye with macular edema H34.8310   4. Retinal edema H35.81 OCT, Retina - OU - Both Eyes  5. Lattice degeneration of left retina H35.412   6. Atrophic retinal break, multiple, left eye H33.332   7. Posterior vitreous detachment of both eyes H43.813   8. Combined forms of age-related cataract of both eyes H25.813    1,2. 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 Kathlen Mody - continue po acyclovir, 400 mg BID per Dr. Kathlen Mody - re-start Pred Forte QID OD - F/U 2-3 weeks, DFE, OCT, AC check  3,4. BRVO w/ CME OD - s/p IVA OD #1 (12.21.18), #2 (01.18.19), #3 (02.14.19), #4 (04.02.19), #5 (05.02.19) - OCT today with interval improvement in IRF despite no antiVEGF therapy since May 2019 -- resolved - will continue to hold injection today   5,6. Lattice degeneration with atrophic holes OS-  - peripheral holes superiorly at 1030 without SRF or RD - S/P laser retinopexy OS (01.04.19) - good laser in place -- stable  7. PVD / vitreous syneresis OU  Discussed findings and prognosis  No RT or RD on 360 scleral depressed exam  Reviewed s/s of RT/RD  Strict return precautions for any such RT/RD signs/symptoms  8. Combined forms age-related cataract OU-  - The symptoms of cataract, surgical options, and treatments and risks were discussed with patient. - discussed diagnosis and progression - not yet visually significant - monitor for now   Ophthalmic Meds Ordered this visit:  Meds ordered this encounter  Medications  . prednisoLONE acetate (PRED FORTE) 1 % ophthalmic suspension    Sig: Place 1 drop into the right eye 4 (four) times daily.    Dispense:  15 mL    Refill:  0    Please consider 90 day supplies to promote better adherence  . dorzolamide-timolol (COSOPT) 22.3-6.8 MG/ML ophthalmic solution    Sig: Place 1 drop into both eyes 2 (two) times daily.    Dispense:  10 mL    Refill:  3       Return for 2-3 wks - AC check, Dilated Exam.  There are no Patient Instructions on file for this visit.   Explained the diagnoses, plan, and follow up with the patient and they expressed understanding.  Patient expressed understanding of the importance of proper follow up care.   This document serves as a record of services  personally performed by Gardiner Sleeper, MD, PhD. It was created on their behalf by Ernest Mallick, OA, an ophthalmic assistant. The creation of this record is the provider's dictation and/or activities during the visit.    Electronically signed by: Ernest Mallick, OA  03.09.2020 11:36 AM   Gardiner Sleeper, M.D., Ph.D. Diseases & Surgery of the Retina and Vitreous [facility]  I have reviewed the above documentation for accuracy and completeness, and I agree with the above. Gardiner Sleeper, M.D., Ph.D. 07/14/18 11:36 AM    Abbreviations: M myopia (nearsighted); A astigmatism; H hyperopia (farsighted); P presbyopia; Mrx spectacle prescription;  CTL contact lenses; OD right eye; OS left eye; OU both eyes  XT exotropia; ET esotropia; PEK punctate epithelial keratitis; PEE punctate epithelial erosions; DES dry eye syndrome; MGD meibomian gland dysfunction; ATs artificial tears; PFAT's preservative free artificial tears; [place] nuclear sclerotic cataract; PSC posterior subcapsular cataract; ERM epi-retinal membrane; PVD posterior vitreous detachment; RD retinal detachment; DM diabetes mellitus; DR diabetic retinopathy; NPDR non-proliferative diabetic retinopathy; PDR proliferative diabetic retinopathy; CSME clinically significant macular edema; DME diabetic macular edema; dbh dot blot hemorrhages; CWS cotton wool spot; POAG primary open angle glaucoma; C/D cup-to-disc ratio; HVF humphrey visual field; GVF goldmann visual field; OCT optical coherence tomography; IOP intraocular pressure; BRVO Branch retinal vein occlusion; CRVO central retinal vein occlusion; CRAO central retinal artery occlusion; BRAO branch retinal artery occlusion; RT retinal tear; SB scleral buckle; PPV pars plana vitrectomy; VH Vitreous hemorrhage; PRP panretinal laser photocoagulation; IVK intravitreal kenalog; VMT vitreomacular traction; MH Macular hole;  NVD neovascularization of the disc; NVE neovascularization  elsewhere; AREDS age related eye disease study; ARMD age related macular degeneration; POAG primary open angle glaucoma; EBMD epithelial/anterior basement membrane dystrophy; ACIOL anterior chamber intraocular lens; IOL intraocular lens; PCIOL posterior chamber intraocular lens; Phaco/IOL phacoemulsification with intraocular lens placement; [place] photorefractive keratectomy; LASIK laser assisted in situ keratomileusis; HTN hypertension; DM diabetes mellitus; COPD chronic obstructive pulmonary disease

## 2018-07-11 ENCOUNTER — Ambulatory Visit (INDEPENDENT_AMBULATORY_CARE_PROVIDER_SITE_OTHER): Payer: Medicare Other | Admitting: Ophthalmology

## 2018-07-11 ENCOUNTER — Other Ambulatory Visit: Payer: Self-pay

## 2018-07-11 DIAGNOSIS — H3581 Retinal edema: Secondary | ICD-10-CM

## 2018-07-11 DIAGNOSIS — H25813 Combined forms of age-related cataract, bilateral: Secondary | ICD-10-CM | POA: Diagnosis not present

## 2018-07-11 DIAGNOSIS — H34831 Tributary (branch) retinal vein occlusion, right eye, with macular edema: Secondary | ICD-10-CM

## 2018-07-11 DIAGNOSIS — B0052 Herpesviral keratitis: Secondary | ICD-10-CM

## 2018-07-11 DIAGNOSIS — H43813 Vitreous degeneration, bilateral: Secondary | ICD-10-CM

## 2018-07-11 DIAGNOSIS — H35412 Lattice degeneration of retina, left eye: Secondary | ICD-10-CM

## 2018-07-11 DIAGNOSIS — H33332 Multiple defects of retina without detachment, left eye: Secondary | ICD-10-CM

## 2018-07-11 DIAGNOSIS — N186 End stage renal disease: Secondary | ICD-10-CM | POA: Diagnosis not present

## 2018-07-11 DIAGNOSIS — H209 Unspecified iridocyclitis: Secondary | ICD-10-CM | POA: Diagnosis not present

## 2018-07-11 DIAGNOSIS — N2581 Secondary hyperparathyroidism of renal origin: Secondary | ICD-10-CM | POA: Diagnosis not present

## 2018-07-11 MED ORDER — PREDNISOLONE ACETATE 1 % OP SUSP: 1.0000 [drp] | Freq: Four times a day (QID) | OPHTHALMIC | 0 refills | Status: DC

## 2018-07-11 MED ORDER — DORZOLAMIDE HCL-TIMOLOL MAL 2-0.5 % OP SOLN: 1.0000 [drp] | Freq: Two times a day (BID) | OPHTHALMIC | 3 refills | Status: DC

## 2018-07-13 DIAGNOSIS — N186 End stage renal disease: Secondary | ICD-10-CM | POA: Diagnosis not present

## 2018-07-13 DIAGNOSIS — N2581 Secondary hyperparathyroidism of renal origin: Secondary | ICD-10-CM | POA: Diagnosis not present

## 2018-07-14 ENCOUNTER — Encounter (INDEPENDENT_AMBULATORY_CARE_PROVIDER_SITE_OTHER): Payer: Self-pay | Admitting: Ophthalmology

## 2018-07-16 DIAGNOSIS — N186 End stage renal disease: Secondary | ICD-10-CM | POA: Diagnosis not present

## 2018-07-16 DIAGNOSIS — N2581 Secondary hyperparathyroidism of renal origin: Secondary | ICD-10-CM | POA: Diagnosis not present

## 2018-07-18 DIAGNOSIS — N2581 Secondary hyperparathyroidism of renal origin: Secondary | ICD-10-CM | POA: Diagnosis not present

## 2018-07-18 DIAGNOSIS — N186 End stage renal disease: Secondary | ICD-10-CM | POA: Diagnosis not present

## 2018-07-20 DIAGNOSIS — N186 End stage renal disease: Secondary | ICD-10-CM | POA: Diagnosis not present

## 2018-07-20 DIAGNOSIS — N2581 Secondary hyperparathyroidism of renal origin: Secondary | ICD-10-CM | POA: Diagnosis not present

## 2018-07-23 DIAGNOSIS — N2581 Secondary hyperparathyroidism of renal origin: Secondary | ICD-10-CM | POA: Diagnosis not present

## 2018-07-23 DIAGNOSIS — N186 End stage renal disease: Secondary | ICD-10-CM | POA: Diagnosis not present

## 2018-07-24 NOTE — Progress Notes (Signed)
[facility] Note  07/25/2018     CHIEF COMPLAINT Patient presents for Retina Evaluation   HISTORY OF PRESENT ILLNESS: Michael [name] is a 63 y.o. male who presents to the clinic today for:   HPI    Retina Evaluation    In right eye.  This started weeks ago.  Duration of weeks.  Context:  distance vision.  Response to treatment was mild improvement.  I, the attending physician,  performed the HPI with the patient and updated documentation appropriately.          Comments    2 week retinal eval.  Patient states vision is stable in both eyes.  He denies eye pain or discomfort and denies any new or worsening floaters or flashes of light.       Last edited by [name], MD on 07/25/2018  1:44 PM. (History)    pt states he is not having any light sensitivity, he states he is using PF QID OD and cosopt BID OD  Referring physician: [name], MD [place], [place] [id number]  HISTORICAL INFORMATION:   Selected notes from the MEDICAL RECORD NUMBER Referred by Dr. [name]. Hecker for concern of HRVO OD; LEE- 11.30.18 ([name]) {BCVA OD: 20/100-1 OS: 20/20-2] Ocular Hx- cataract OU PMH- HTN, high chol, kidney disease, sleep apnea, emphysema    CURRENT MEDICATIONS: Current Outpatient Medications (Ophthalmic Drugs)  Medication Sig  . dorzolamide-timolol (COSOPT) 22.3-6.8 MG/ML ophthalmic solution Place 1 drop into both eyes 2 (two) times daily.  . Ganciclovir (ZIRGAN) 0.15 % GEL Place 1 drop into the right eye 5 (five) times daily.  . prednisoLONE acetate (PRED FORTE) 1 % ophthalmic suspension Place 1 drop into the right eye 4 (four) times daily.   No current facility-administered medications for this visit.  (Ophthalmic Drugs)   Current Outpatient Medications (Other)  Medication Sig  . acyclovir (ZOVIRAX) 400 MG tablet   . aspirin EC 81 MG EC tablet Take 1 tablet (81 mg total) by mouth daily.  [name] atorvastatin (LIPITOR) 40  MG tablet Take 40 mg by mouth every evening.   . calcium acetate (PHOSLO) 667 MG capsule Take 1,334 mg by mouth 3 (three) times daily with meals.   . metoprolol succinate (TOPROL-XL) 100 MG 24 hr tablet Take 50 mg by mouth See admin instructions. Take 50 mg by mouth daily on Tuesday, Thursday and Saturday.  . nitroGLYCERIN (NITROSTAT) 0.4 MG SL tablet Place 1 tablet (0.4 mg total) under the tongue every 5 (five) minutes x 3 doses as needed for chest pain.  [name] RENVELA 800 MG tablet   . SENSIPAR 60 MG tablet Take 60 mg by mouth every evening.    Current Facility-Administered Medications (Other)  Medication Route  . Bevacizumab (AVASTIN) SOLN 1.25 mg Intravitreal  . Bevacizumab (AVASTIN) SOLN 1.25 mg Intravitreal  . Bevacizumab (AVASTIN) SOLN 1.25 mg Intravitreal  . Bevacizumab (AVASTIN) SOLN 1.25 mg Intravitreal  . Bevacizumab (AVASTIN) SOLN 1.25 mg Intravitreal      REVIEW OF SYSTEMS: ROS    Positive for: Endocrine, Cardiovascular, Eyes, Respiratory   Negative for: Constitutional, Gastrointestinal, Neurological, Skin, Genitourinary, Musculoskeletal, HENT, Psychiatric, Allergic/Imm, Heme/Lymph   Last edited by Doneen Poisson on 07/25/2018 12:33 PM. (History)       ALLERGIES No Known Allergies  PAST MEDICAL HISTORY Past Medical History:  Diagnosis Date  . Cataract   . Dyspnea    on exertion  . ESRD (end stage  renal disease) ([place])    Hemo- MWF, Polycystic kidney disease  . Fatigue   . History of kidney stones    removal of stone- cysto  . Hyperlipidemia   . Hyperparathyroidism, secondary renal ([place])   . Hypertension   . Hypoxemia 12/12/2013  . Nonischemic cardiomyopathy ([place])    Er 25% 2015, 55 % 2013  . OSA on CPAP    no longer using cpap  . OSA on CPAP 03/24/2014  . Pneumonia    2015ish  . Ventricular tachycardia//Freq PVCs    Past Surgical History:  Procedure Laterality Date  . A/V FISTULAGRAM Left 04/27/2017   Procedure: A/V FISTULAGRAM;  Surgeon: Conrad Troxelville, MD;  Location: [facility];  Service: Cardiovascular;  Laterality: Left;  lt arm  . APPENDECTOMY    . AV FISTULA PLACEMENT  12/05/2011   Procedure: ARTERIOVENOUS (AV) FISTULA CREATION;LLEFT ARM  Surgeon: Conrad Cherokee, MD;  Location: [facility];  Service: Vascular;  Laterality: Left;  RADIO-CEPHALIC  fistula left arm  . AV FISTULA PLACEMENT  01/11/2012   Procedure: ARTERIOVENOUS (AV) FISTULA CREATION;  Surgeon: Conrad Sebastian, MD;  Location: [facility];  Service: Vascular;  Laterality: Left;  Creation of left brachial cephalic arteriovenous fistula  . BASCILIC VEIN TRANSPOSITION Left 12/27/2016   Procedure: BASILIC VEIN TRANSPOSITION LEFT UPPER ARM FIRST STAGE;  Surgeon: Conrad Pacific, MD;  Location: [facility];  Service: Vascular;  Laterality: Left;  . BASCILIC VEIN TRANSPOSITION Left 01/31/2017   Procedure: LEFT ARM BASILIC VEIN TRANSPOSITION, SECOND STAGE;  Surgeon: Conrad London, MD;  Location: [facility];  Service: Vascular;  Laterality: Left;  . CARDIAC CATHETERIZATION  04-05-2010   checking for blockage but none-[facility]  . COLONOSCOPY    . CYSTOSCOPY W/ STONE MANIPULATION     "laser once" (01/22/2013)  . HEMATOMA EVACUATION Left 05/09/2017   Procedure: EVACUATION HEMATOMA LEFT ARM;  Surgeon: Conrad Nolan, MD;  Location: [facility];  Service: Vascular;  Laterality: Left;  . HERNIA REPAIR    . INGUINAL HERNIA REPAIR Right 11/06/2015   Procedure: OPEN HERNIA REPAIR  RIGHT INGUINAL ADULT;  Surgeon: [name], MD;  Location: [facility];  Service: General;  Laterality: Right;  with MESH  . INSERTION OF DIALYSIS CATHETER Right 10/05/2016   Procedure: INSERTION OF right internal jugular DIALYSIS CATHETER;  Surgeon: [name], MD;  Location: [facility];  Service: Vascular;  Laterality: Right;  . INSERTION OF MESH  03/20/2012   Procedure: INSERTION OF MESH;  UMB Surgeon: [name], MD;  Location: [facility];  Service: General;  Laterality: N/A;  . INSERTION OF MESH N/A 01/22/2013   Procedure: INSERTION OF MESH;  Surgeon:  [name], MD;  Location: [facility];  Service: General;  Laterality: N/A;  . LAPAROTOMY  04/02/2012   Procedure: EXPLORATORY LAPAROTOMY;  Surgeon: [name], MD;  Location: [facility];  Service: General;  Laterality: N/A;  Exploratory Laparotomy with resection of small intestine  . LEFT HEART CATHETERIZATION WITH CORONARY ANGIOGRAM N/A 05/14/2013   Procedure: LEFT HEART CATHETERIZATION WITH CORONARY ANGIOGRAM;  Surgeon: Sinclair Grooms, MD;  Location: [facility];  Service: Cardiovascular;  Laterality: N/A;  . LIGATION OF ARTERIOVENOUS  FISTULA Left 12/27/2016   Procedure: LIGATION/EXCISION OF LEFT UPPER ARM ARTERIOVENOUS  FISTULA;  EVACUATION OF HEMATOMA;  Surgeon: Conrad , MD;  Location: [facility];  Service: Vascular;  Laterality: Left;  . REVISON OF ARTERIOVENOUS FISTULA Left 10/05/2016   Procedure: REVISON OF left arm ARTERIOVENOUS  FISTULA;  Surgeon: [name], MD;  Location: [facility];  Service: Vascular;  Laterality: Left;  . TONSILLECTOMY    . UMBILICAL HERNIA REPAIR  03/20/2012   Procedure: HERNIA REPAIR UMBILICAL ADULT;  Surgeon: [name], MD;  Location: [facility];  Service: General;  Laterality: N/A;  . UMBILICAL HERNIA REPAIR  01/22/2013   preperitoneal open procedure due to significant adhesions/notes 01/22/2013  . VENTRAL HERNIA REPAIR N/A 01/22/2013   Procedure: ATTEMPTED LAPAROSCOPIC VENTRAL HERNIA CONVERTED TO OPEN;  Surgeon: [name], MD;  Location[facility];  Service: General;  Laterality: N/A;    FAMILY HISTORY Family History  Problem Relation Age of Onset  . Heart disease Mother   . Hyperlipidemia Mother   . Hypertension Mother   . Kidney disease Father   . Stroke Father   . Kidney disease Brother   . Amblyopia Neg Hx   . Blindness Neg Hx   . Cataracts Neg Hx   . Diabetes Neg Hx   . Glaucoma Neg Hx   . Macular degeneration Neg Hx   . Retinal detachment Neg Hx   . Strabismus Neg Hx   . Retinitis pigmentosa Neg Hx     SOCIAL HISTORY Social  History   Tobacco Use  . Smoking status: Never Smoker  . Smokeless tobacco: Never Used  Substance Use Topics  . Alcohol use: No    Alcohol/week: 0.0 standard drinks  . Drug use: No         OPHTHALMIC EXAM:  Base Eye Exam    Visual Acuity (Snellen - Linear)      Right Left   Dist cc 20/20 -1 20/20       Tonometry (Tonopen, 12:35 PM)      Right Left   Pressure 15 16       Pupils      Dark Light Shape React APD   Right 4.5 4 Round Brisk 0   Left 4.5 4 Round Brisk 0       Neuro/Psych    Oriented x3:  Yes   Mood/Affect:  Normal       Dilation    Both eyes:  1.0% Mydriacyl, 2.5% Phenylephrine @ 12:36 PM        Slit Lamp and Fundus Exam    Slit Lamp Exam      Right Left   Lids/Lashes Dermatochalasis - upper lid Dermatochalasis - upper lid   Conjunctiva/Sclera Mild Melanosis, Nasal Pinguecula Mild Melanosis, nasal and temporal Pinguecula   Cornea Arcus, 1+ Punctate epithelial erosions; no dendrites, patchy corneal haze, trace endo pigment Arcus   Anterior Chamber Deep and quiet, rare pigment Deep and quiet   Iris Round and dilated Round and dilated   Lens 2+ Nuclear sclerosis, 2+ Cortical cataract 2+ Nuclear sclerosis, 2+ Cortical cataract   Vitreous Vitreous syneresis, no cell, +Posterior vitreous detachment Vitreous syneresis, Posterior vitreous detachment       Fundus Exam      Right Left   Disc vascular loops, Pink and Sharp Normal   C/D Ratio 0.5 0.6   Macula Blunted foveal reflex, RPE mottling, No heme or edema Good foveal reflex, Retinal pigment epithelial mottling, No heme or edema   Vessels Vascular attenuation, dilated and Tortuous venules superiorly, Copper wiring superiorly, AV crossing changes Mild Copper wiring, mildly Tortuous, AV crossing changes   Periphery Attached, DBH superiorly and temporally improved, pigmented Chorioretinal scar at 1030 Attached, pigmented lattice with 3 atrophic holes at 1030 ora - good laser surrounding  Refraction    Wearing Rx      Sphere Add   Right     Left     Type:  Bifocal       Wearing Rx #2      Sphere Add   Right +0.50 +2.25   Left +0.25 +2.25   Type:  Bifocal          IMAGING AND PROCEDURES  Imaging and Procedures for 08/31/17  OCT, Retina - OU - Both Eyes       Right Eye Quality was good. Central Foveal Thickness: 244. Progression has been stable. Findings include normal foveal contour, outer retinal atrophy, no SRF, no IRF, vitreomacular adhesion  (Focal disruption of ellipsoid central fovea).   Left Eye Quality was good. Central Foveal Thickness: 274. Progression has been stable. Findings include normal foveal contour, no IRF, no SRF, epiretinal membrane, vitreomacular adhesion .   Notes *Images captured and stored on drive  Diagnosis / Impression:  OD: NFP, No IRF/SRF, stable residual ellipsoid thinning OS: trace ERM  Clinical management:  See below  Abbreviations: NFP - Normal foveal profile. CME - cystoid macular edema. PED - pigment epithelial detachment. IRF - intraretinal fluid. SRF - subretinal fluid. EZ - ellipsoid zone. ERM - epiretinal membrane. ORA - outer retinal atrophy. ORT - outer retinal tubulation. SRHM - subretinal hyper-reflective material                  ASSESSMENT/PLAN:    ICD-10-CM   1. Herpes keratitis B00.52   2. 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 Branch retinal vein occlusion of right eye with macular edema H34.8310   4. Retinal edema H35.81 OCT, Retina - OU - Both Eyes  5. Lattice degeneration of left retina H35.412   6. Atrophic retinal break, multiple, left eye H33.332   7. Posterior vitreous detachment of both eyes H43.813   8. Combined forms of age-related cataract of both eyes H25.813   9. Branch retinal vein occlusion of left eye with macular edema H34.8320   10. Acute anterior uveitis of right eye H20.00    1,2. 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 - likely etiology of prior episodes of  Anterior Uveitis OD - FA on 3.18.19 without posterior involvement, vasculitis or leakage OD - had recurrent AC cell and stellate KP last visit on 3.11.2020 - restarted PF QID OD - today KP and injection resolved, only rare pigment remains - IOP 15 OD, 16 OS today  - dec Pred Forte to BID OD - given history of repeat recurrences of anterior uveitis despite maintenance acyclovir, may need to be on a maintenance dose of topical steroid - cont Cosopt BID OU - epithelial dendrite remains resolved today (on po acyclovir; off topical trifluridine) - under the expert management of Dr. Kathlen Mody - continue po acyclovir, 400 mg BID per Dr. Kathlen Mody - discussed case with Dr. Kathlen Mody, who will see pt in 2 wks and assume management  3,4. History of BRVO w/ CME OD - s/p IVA OD #1 (12.21.18), #2 (01.18.19), #3 (02.14.19), #4 (04.02.19), #5 (05.02.19) - OCT today with stable improvement in IRF despite no antiVEGF therapy since May 2019 -- resolved - will continue to hold injection   5,6. Lattice degeneration with atrophic holes OS-  - peripheral holes superiorly at 1030 without SRF or RD - S/P laser retinopexy OS (01.04.19) - good laser in place -- stable - f/u in 6 mos  7. PVD / vitreous syneresis OU  Discussed  findings and prognosis  No RT or RD on 360 scleral depressed exam  Reviewed s/s of RT/RD  Strict return precautions for any such RT/RD signs/symptoms  8. Combined forms age-related cataract OU-  - The symptoms of cataract, surgical options, and treatments and risks were discussed with patient. - discussed diagnosis and progression - not yet visually significant - monitor for now   Ophthalmic Meds Ordered this visit:  No orders of the defined types were placed in this encounter.      Return in about 6 months (around 01/25/2019) for f/u retinal eval, DFE, OCT.  There are no Patient Instructions on file for this visit.   Explained the diagnoses, plan, and follow up with the patient and  they expressed understanding.  Patient expressed understanding of the importance of proper follow up care.   This document serves as a record of services personally performed by Gardiner Sleeper, MD, PhD. It was created on their behalf by Ernest Mallick, OA, an ophthalmic assistant. The creation of this record is the provider's dictation and/or activities during the visit.    Electronically signed by: Ernest Mallick, OA  03.24.2020 1:44 PM    Gardiner Sleeper, M.D., Ph.D. Diseases & Surgery of the Retina and Vitreous [facility]  I have reviewed the above documentation for accuracy and completeness, and I agree with the above. Gardiner Sleeper, M.D., Ph.D. 07/25/18 1:50 PM   Abbreviations: M myopia (nearsighted); A astigmatism; H hyperopia (farsighted); P presbyopia; Mrx spectacle prescription;  CTL contact lenses; OD right eye; OS left eye; OU both eyes  XT exotropia; ET esotropia; PEK punctate epithelial keratitis; PEE punctate epithelial erosions; DES dry eye syndrome; MGD meibomian gland dysfunction; ATs artificial tears; PFAT's preservative free artificial tears; [place] nuclear sclerotic cataract; PSC posterior subcapsular cataract; ERM epi-retinal membrane; PVD posterior vitreous detachment; RD retinal detachment; DM diabetes mellitus; DR diabetic retinopathy; NPDR non-proliferative diabetic retinopathy; PDR proliferative diabetic retinopathy; CSME clinically significant macular edema; DME diabetic macular edema; dbh dot blot hemorrhages; CWS cotton wool spot; POAG primary open angle glaucoma; C/D cup-to-disc ratio; HVF humphrey visual field; GVF goldmann visual field; OCT optical coherence tomography; IOP intraocular pressure; BRVO Branch retinal vein occlusion; CRVO central retinal vein occlusion; CRAO central retinal artery occlusion; BRAO branch retinal artery occlusion; RT retinal tear; SB scleral buckle; PPV pars plana vitrectomy; VH Vitreous hemorrhage; PRP panretinal laser  photocoagulation; IVK intravitreal kenalog; VMT vitreomacular traction; MH Macular hole;  NVD neovascularization of the disc; NVE neovascularization elsewhere; AREDS age related eye disease study; ARMD age related macular degeneration; POAG primary open angle glaucoma; EBMD epithelial/anterior basement membrane dystrophy; ACIOL anterior chamber intraocular lens; IOL intraocular lens; PCIOL posterior chamber intraocular lens; Phaco/IOL phacoemulsification with intraocular lens placement; [place] photorefractive keratectomy; LASIK laser assisted in situ keratomileusis; HTN hypertension; DM diabetes mellitus; COPD chronic obstructive pulmonary disease

## 2018-07-25 ENCOUNTER — Other Ambulatory Visit: Payer: Self-pay

## 2018-07-25 ENCOUNTER — Encounter (INDEPENDENT_AMBULATORY_CARE_PROVIDER_SITE_OTHER): Payer: Self-pay | Admitting: Ophthalmology

## 2018-07-25 ENCOUNTER — Ambulatory Visit (INDEPENDENT_AMBULATORY_CARE_PROVIDER_SITE_OTHER): Payer: Medicare Other | Admitting: Ophthalmology

## 2018-07-25 DIAGNOSIS — H25813 Combined forms of age-related cataract, bilateral: Secondary | ICD-10-CM

## 2018-07-25 DIAGNOSIS — B0052 Herpesviral keratitis: Secondary | ICD-10-CM | POA: Diagnosis not present

## 2018-07-25 DIAGNOSIS — H209 Unspecified iridocyclitis: Secondary | ICD-10-CM | POA: Diagnosis not present

## 2018-07-25 DIAGNOSIS — N186 End stage renal disease: Secondary | ICD-10-CM | POA: Diagnosis not present

## 2018-07-25 DIAGNOSIS — H34831 Tributary (branch) retinal vein occlusion, right eye, with macular edema: Secondary | ICD-10-CM | POA: Diagnosis not present

## 2018-07-25 DIAGNOSIS — N2581 Secondary hyperparathyroidism of renal origin: Secondary | ICD-10-CM | POA: Diagnosis not present

## 2018-07-25 DIAGNOSIS — H33332 Multiple defects of retina without detachment, left eye: Secondary | ICD-10-CM | POA: Diagnosis not present

## 2018-07-25 DIAGNOSIS — H3581 Retinal edema: Secondary | ICD-10-CM

## 2018-07-25 DIAGNOSIS — H35412 Lattice degeneration of retina, left eye: Secondary | ICD-10-CM | POA: Diagnosis not present

## 2018-07-25 DIAGNOSIS — H43813 Vitreous degeneration, bilateral: Secondary | ICD-10-CM

## 2018-07-27 ENCOUNTER — Encounter (INDEPENDENT_AMBULATORY_CARE_PROVIDER_SITE_OTHER): Payer: Medicare Other | Admitting: Ophthalmology

## 2018-07-27 DIAGNOSIS — N186 End stage renal disease: Secondary | ICD-10-CM | POA: Diagnosis not present

## 2018-07-27 DIAGNOSIS — N2581 Secondary hyperparathyroidism of renal origin: Secondary | ICD-10-CM | POA: Diagnosis not present

## 2018-07-30 DIAGNOSIS — N186 End stage renal disease: Secondary | ICD-10-CM | POA: Diagnosis not present

## 2018-07-30 DIAGNOSIS — N2581 Secondary hyperparathyroidism of renal origin: Secondary | ICD-10-CM | POA: Diagnosis not present

## 2018-08-01 DIAGNOSIS — N186 End stage renal disease: Secondary | ICD-10-CM | POA: Diagnosis not present

## 2018-08-01 DIAGNOSIS — D509 Iron deficiency anemia, unspecified: Secondary | ICD-10-CM | POA: Diagnosis not present

## 2018-08-01 DIAGNOSIS — N2581 Secondary hyperparathyroidism of renal origin: Secondary | ICD-10-CM | POA: Diagnosis not present

## 2018-08-01 DIAGNOSIS — Z992 Dependence on renal dialysis: Secondary | ICD-10-CM | POA: Diagnosis not present

## 2018-08-01 DIAGNOSIS — Q612 Polycystic kidney, adult type: Secondary | ICD-10-CM | POA: Diagnosis not present

## 2018-08-03 DIAGNOSIS — N2581 Secondary hyperparathyroidism of renal origin: Secondary | ICD-10-CM | POA: Diagnosis not present

## 2018-08-03 DIAGNOSIS — D509 Iron deficiency anemia, unspecified: Secondary | ICD-10-CM | POA: Diagnosis not present

## 2018-08-03 DIAGNOSIS — N186 End stage renal disease: Secondary | ICD-10-CM | POA: Diagnosis not present

## 2018-08-06 DIAGNOSIS — D509 Iron deficiency anemia, unspecified: Secondary | ICD-10-CM | POA: Diagnosis not present

## 2018-08-06 DIAGNOSIS — N186 End stage renal disease: Secondary | ICD-10-CM | POA: Diagnosis not present

## 2018-08-06 DIAGNOSIS — N2581 Secondary hyperparathyroidism of renal origin: Secondary | ICD-10-CM | POA: Diagnosis not present

## 2018-08-08 DIAGNOSIS — N186 End stage renal disease: Secondary | ICD-10-CM | POA: Diagnosis not present

## 2018-08-08 DIAGNOSIS — N2581 Secondary hyperparathyroidism of renal origin: Secondary | ICD-10-CM | POA: Diagnosis not present

## 2018-08-08 DIAGNOSIS — D509 Iron deficiency anemia, unspecified: Secondary | ICD-10-CM | POA: Diagnosis not present

## 2018-08-10 DIAGNOSIS — N2581 Secondary hyperparathyroidism of renal origin: Secondary | ICD-10-CM | POA: Diagnosis not present

## 2018-08-10 DIAGNOSIS — N186 End stage renal disease: Secondary | ICD-10-CM | POA: Diagnosis not present

## 2018-08-10 DIAGNOSIS — D509 Iron deficiency anemia, unspecified: Secondary | ICD-10-CM | POA: Diagnosis not present

## 2018-08-13 DIAGNOSIS — D509 Iron deficiency anemia, unspecified: Secondary | ICD-10-CM | POA: Diagnosis not present

## 2018-08-13 DIAGNOSIS — N186 End stage renal disease: Secondary | ICD-10-CM | POA: Diagnosis not present

## 2018-08-13 DIAGNOSIS — N2581 Secondary hyperparathyroidism of renal origin: Secondary | ICD-10-CM | POA: Diagnosis not present

## 2018-08-14 DIAGNOSIS — B0052 Herpesviral keratitis: Secondary | ICD-10-CM | POA: Diagnosis not present

## 2018-08-14 DIAGNOSIS — B0051 Herpesviral iridocyclitis: Secondary | ICD-10-CM | POA: Diagnosis not present

## 2018-08-15 DIAGNOSIS — N2581 Secondary hyperparathyroidism of renal origin: Secondary | ICD-10-CM | POA: Diagnosis not present

## 2018-08-15 DIAGNOSIS — D509 Iron deficiency anemia, unspecified: Secondary | ICD-10-CM | POA: Diagnosis not present

## 2018-08-15 DIAGNOSIS — N186 End stage renal disease: Secondary | ICD-10-CM | POA: Diagnosis not present

## 2018-08-17 DIAGNOSIS — D509 Iron deficiency anemia, unspecified: Secondary | ICD-10-CM | POA: Diagnosis not present

## 2018-08-17 DIAGNOSIS — N2581 Secondary hyperparathyroidism of renal origin: Secondary | ICD-10-CM | POA: Diagnosis not present

## 2018-08-17 DIAGNOSIS — N186 End stage renal disease: Secondary | ICD-10-CM | POA: Diagnosis not present

## 2018-08-20 DIAGNOSIS — N186 End stage renal disease: Secondary | ICD-10-CM | POA: Diagnosis not present

## 2018-08-20 DIAGNOSIS — D509 Iron deficiency anemia, unspecified: Secondary | ICD-10-CM | POA: Diagnosis not present

## 2018-08-20 DIAGNOSIS — N2581 Secondary hyperparathyroidism of renal origin: Secondary | ICD-10-CM | POA: Diagnosis not present

## 2018-08-22 DIAGNOSIS — D509 Iron deficiency anemia, unspecified: Secondary | ICD-10-CM | POA: Diagnosis not present

## 2018-08-22 DIAGNOSIS — N2581 Secondary hyperparathyroidism of renal origin: Secondary | ICD-10-CM | POA: Diagnosis not present

## 2018-08-22 DIAGNOSIS — N186 End stage renal disease: Secondary | ICD-10-CM | POA: Diagnosis not present

## 2018-08-24 DIAGNOSIS — D509 Iron deficiency anemia, unspecified: Secondary | ICD-10-CM | POA: Diagnosis not present

## 2018-08-24 DIAGNOSIS — N186 End stage renal disease: Secondary | ICD-10-CM | POA: Diagnosis not present

## 2018-08-24 DIAGNOSIS — N2581 Secondary hyperparathyroidism of renal origin: Secondary | ICD-10-CM | POA: Diagnosis not present

## 2018-08-27 DIAGNOSIS — N2581 Secondary hyperparathyroidism of renal origin: Secondary | ICD-10-CM | POA: Diagnosis not present

## 2018-08-27 DIAGNOSIS — N186 End stage renal disease: Secondary | ICD-10-CM | POA: Diagnosis not present

## 2018-08-27 DIAGNOSIS — D509 Iron deficiency anemia, unspecified: Secondary | ICD-10-CM | POA: Diagnosis not present

## 2018-08-29 DIAGNOSIS — N2581 Secondary hyperparathyroidism of renal origin: Secondary | ICD-10-CM | POA: Diagnosis not present

## 2018-08-29 DIAGNOSIS — N186 End stage renal disease: Secondary | ICD-10-CM | POA: Diagnosis not present

## 2018-08-29 DIAGNOSIS — D509 Iron deficiency anemia, unspecified: Secondary | ICD-10-CM | POA: Diagnosis not present

## 2018-08-31 DIAGNOSIS — N186 End stage renal disease: Secondary | ICD-10-CM | POA: Diagnosis not present

## 2018-08-31 DIAGNOSIS — Q612 Polycystic kidney, adult type: Secondary | ICD-10-CM | POA: Diagnosis not present

## 2018-08-31 DIAGNOSIS — D631 Anemia in chronic kidney disease: Secondary | ICD-10-CM | POA: Diagnosis not present

## 2018-08-31 DIAGNOSIS — D509 Iron deficiency anemia, unspecified: Secondary | ICD-10-CM | POA: Diagnosis not present

## 2018-08-31 DIAGNOSIS — Z992 Dependence on renal dialysis: Secondary | ICD-10-CM | POA: Diagnosis not present

## 2018-08-31 DIAGNOSIS — N2581 Secondary hyperparathyroidism of renal origin: Secondary | ICD-10-CM | POA: Diagnosis not present

## 2018-09-03 DIAGNOSIS — N186 End stage renal disease: Secondary | ICD-10-CM | POA: Diagnosis not present

## 2018-09-03 DIAGNOSIS — D631 Anemia in chronic kidney disease: Secondary | ICD-10-CM | POA: Diagnosis not present

## 2018-09-03 DIAGNOSIS — N2581 Secondary hyperparathyroidism of renal origin: Secondary | ICD-10-CM | POA: Diagnosis not present

## 2018-09-03 DIAGNOSIS — D509 Iron deficiency anemia, unspecified: Secondary | ICD-10-CM | POA: Diagnosis not present

## 2018-09-05 DIAGNOSIS — N186 End stage renal disease: Secondary | ICD-10-CM | POA: Diagnosis not present

## 2018-09-05 DIAGNOSIS — D509 Iron deficiency anemia, unspecified: Secondary | ICD-10-CM | POA: Diagnosis not present

## 2018-09-05 DIAGNOSIS — D631 Anemia in chronic kidney disease: Secondary | ICD-10-CM | POA: Diagnosis not present

## 2018-09-05 DIAGNOSIS — N2581 Secondary hyperparathyroidism of renal origin: Secondary | ICD-10-CM | POA: Diagnosis not present

## 2018-09-07 DIAGNOSIS — D509 Iron deficiency anemia, unspecified: Secondary | ICD-10-CM | POA: Diagnosis not present

## 2018-09-07 DIAGNOSIS — N186 End stage renal disease: Secondary | ICD-10-CM | POA: Diagnosis not present

## 2018-09-07 DIAGNOSIS — N2581 Secondary hyperparathyroidism of renal origin: Secondary | ICD-10-CM | POA: Diagnosis not present

## 2018-09-07 DIAGNOSIS — D631 Anemia in chronic kidney disease: Secondary | ICD-10-CM | POA: Diagnosis not present

## 2018-09-10 DIAGNOSIS — D631 Anemia in chronic kidney disease: Secondary | ICD-10-CM | POA: Diagnosis not present

## 2018-09-10 DIAGNOSIS — N2581 Secondary hyperparathyroidism of renal origin: Secondary | ICD-10-CM | POA: Diagnosis not present

## 2018-09-10 DIAGNOSIS — N186 End stage renal disease: Secondary | ICD-10-CM | POA: Diagnosis not present

## 2018-09-10 DIAGNOSIS — D509 Iron deficiency anemia, unspecified: Secondary | ICD-10-CM | POA: Diagnosis not present

## 2018-09-12 DIAGNOSIS — D631 Anemia in chronic kidney disease: Secondary | ICD-10-CM | POA: Diagnosis not present

## 2018-09-12 DIAGNOSIS — N186 End stage renal disease: Secondary | ICD-10-CM | POA: Diagnosis not present

## 2018-09-12 DIAGNOSIS — D509 Iron deficiency anemia, unspecified: Secondary | ICD-10-CM | POA: Diagnosis not present

## 2018-09-12 DIAGNOSIS — N2581 Secondary hyperparathyroidism of renal origin: Secondary | ICD-10-CM | POA: Diagnosis not present

## 2018-09-14 DIAGNOSIS — N2581 Secondary hyperparathyroidism of renal origin: Secondary | ICD-10-CM | POA: Diagnosis not present

## 2018-09-14 DIAGNOSIS — D631 Anemia in chronic kidney disease: Secondary | ICD-10-CM | POA: Diagnosis not present

## 2018-09-14 DIAGNOSIS — N186 End stage renal disease: Secondary | ICD-10-CM | POA: Diagnosis not present

## 2018-09-14 DIAGNOSIS — D509 Iron deficiency anemia, unspecified: Secondary | ICD-10-CM | POA: Diagnosis not present

## 2018-09-17 DIAGNOSIS — N186 End stage renal disease: Secondary | ICD-10-CM | POA: Diagnosis not present

## 2018-09-17 DIAGNOSIS — D631 Anemia in chronic kidney disease: Secondary | ICD-10-CM | POA: Diagnosis not present

## 2018-09-17 DIAGNOSIS — D509 Iron deficiency anemia, unspecified: Secondary | ICD-10-CM | POA: Diagnosis not present

## 2018-09-17 DIAGNOSIS — N2581 Secondary hyperparathyroidism of renal origin: Secondary | ICD-10-CM | POA: Diagnosis not present

## 2018-09-19 DIAGNOSIS — N2581 Secondary hyperparathyroidism of renal origin: Secondary | ICD-10-CM | POA: Diagnosis not present

## 2018-09-19 DIAGNOSIS — D631 Anemia in chronic kidney disease: Secondary | ICD-10-CM | POA: Diagnosis not present

## 2018-09-19 DIAGNOSIS — N186 End stage renal disease: Secondary | ICD-10-CM | POA: Diagnosis not present

## 2018-09-19 DIAGNOSIS — D509 Iron deficiency anemia, unspecified: Secondary | ICD-10-CM | POA: Diagnosis not present

## 2018-09-21 DIAGNOSIS — N186 End stage renal disease: Secondary | ICD-10-CM | POA: Diagnosis not present

## 2018-09-21 DIAGNOSIS — D631 Anemia in chronic kidney disease: Secondary | ICD-10-CM | POA: Diagnosis not present

## 2018-09-21 DIAGNOSIS — N2581 Secondary hyperparathyroidism of renal origin: Secondary | ICD-10-CM | POA: Diagnosis not present

## 2018-09-21 DIAGNOSIS — D509 Iron deficiency anemia, unspecified: Secondary | ICD-10-CM | POA: Diagnosis not present

## 2018-09-24 DIAGNOSIS — N2581 Secondary hyperparathyroidism of renal origin: Secondary | ICD-10-CM | POA: Diagnosis not present

## 2018-09-24 DIAGNOSIS — N186 End stage renal disease: Secondary | ICD-10-CM | POA: Diagnosis not present

## 2018-09-24 DIAGNOSIS — D509 Iron deficiency anemia, unspecified: Secondary | ICD-10-CM | POA: Diagnosis not present

## 2018-09-24 DIAGNOSIS — D631 Anemia in chronic kidney disease: Secondary | ICD-10-CM | POA: Diagnosis not present

## 2018-09-26 DIAGNOSIS — D631 Anemia in chronic kidney disease: Secondary | ICD-10-CM | POA: Diagnosis not present

## 2018-09-26 DIAGNOSIS — D509 Iron deficiency anemia, unspecified: Secondary | ICD-10-CM | POA: Diagnosis not present

## 2018-09-26 DIAGNOSIS — N2581 Secondary hyperparathyroidism of renal origin: Secondary | ICD-10-CM | POA: Diagnosis not present

## 2018-09-26 DIAGNOSIS — N186 End stage renal disease: Secondary | ICD-10-CM | POA: Diagnosis not present

## 2018-09-28 DIAGNOSIS — N186 End stage renal disease: Secondary | ICD-10-CM | POA: Diagnosis not present

## 2018-09-28 DIAGNOSIS — D509 Iron deficiency anemia, unspecified: Secondary | ICD-10-CM | POA: Diagnosis not present

## 2018-09-28 DIAGNOSIS — D631 Anemia in chronic kidney disease: Secondary | ICD-10-CM | POA: Diagnosis not present

## 2018-09-28 DIAGNOSIS — N2581 Secondary hyperparathyroidism of renal origin: Secondary | ICD-10-CM | POA: Diagnosis not present

## 2018-10-02 DIAGNOSIS — N2581 Secondary hyperparathyroidism of renal origin: Secondary | ICD-10-CM | POA: Diagnosis not present

## 2018-10-02 DIAGNOSIS — N186 End stage renal disease: Secondary | ICD-10-CM | POA: Diagnosis not present

## 2018-10-03 DIAGNOSIS — N2581 Secondary hyperparathyroidism of renal origin: Secondary | ICD-10-CM | POA: Diagnosis not present

## 2018-10-03 DIAGNOSIS — N186 End stage renal disease: Secondary | ICD-10-CM | POA: Diagnosis not present

## 2018-10-05 DIAGNOSIS — N186 End stage renal disease: Secondary | ICD-10-CM | POA: Diagnosis not present

## 2018-10-05 DIAGNOSIS — N2581 Secondary hyperparathyroidism of renal origin: Secondary | ICD-10-CM | POA: Diagnosis not present

## 2018-10-08 DIAGNOSIS — N2581 Secondary hyperparathyroidism of renal origin: Secondary | ICD-10-CM | POA: Diagnosis not present

## 2018-10-08 DIAGNOSIS — N186 End stage renal disease: Secondary | ICD-10-CM | POA: Diagnosis not present

## 2018-10-10 DIAGNOSIS — N186 End stage renal disease: Secondary | ICD-10-CM | POA: Diagnosis not present

## 2018-10-10 DIAGNOSIS — N2581 Secondary hyperparathyroidism of renal origin: Secondary | ICD-10-CM | POA: Diagnosis not present

## 2018-10-12 DIAGNOSIS — N2581 Secondary hyperparathyroidism of renal origin: Secondary | ICD-10-CM | POA: Diagnosis not present

## 2018-10-12 DIAGNOSIS — N186 End stage renal disease: Secondary | ICD-10-CM | POA: Diagnosis not present

## 2018-10-15 DIAGNOSIS — N186 End stage renal disease: Secondary | ICD-10-CM | POA: Diagnosis not present

## 2018-10-15 DIAGNOSIS — N2581 Secondary hyperparathyroidism of renal origin: Secondary | ICD-10-CM | POA: Diagnosis not present

## 2018-10-19 DIAGNOSIS — N186 End stage renal disease: Secondary | ICD-10-CM | POA: Diagnosis not present

## 2018-10-19 DIAGNOSIS — N2581 Secondary hyperparathyroidism of renal origin: Secondary | ICD-10-CM | POA: Diagnosis not present

## 2018-10-22 DIAGNOSIS — N186 End stage renal disease: Secondary | ICD-10-CM | POA: Diagnosis not present

## 2018-10-22 DIAGNOSIS — N2581 Secondary hyperparathyroidism of renal origin: Secondary | ICD-10-CM | POA: Diagnosis not present

## 2018-10-24 DIAGNOSIS — N186 End stage renal disease: Secondary | ICD-10-CM | POA: Diagnosis not present

## 2018-10-24 DIAGNOSIS — N2581 Secondary hyperparathyroidism of renal origin: Secondary | ICD-10-CM | POA: Diagnosis not present

## 2018-10-26 DIAGNOSIS — N2581 Secondary hyperparathyroidism of renal origin: Secondary | ICD-10-CM | POA: Diagnosis not present

## 2018-10-26 DIAGNOSIS — N186 End stage renal disease: Secondary | ICD-10-CM | POA: Diagnosis not present

## 2018-10-29 DIAGNOSIS — N186 End stage renal disease: Secondary | ICD-10-CM | POA: Diagnosis not present

## 2018-10-29 DIAGNOSIS — N2581 Secondary hyperparathyroidism of renal origin: Secondary | ICD-10-CM | POA: Diagnosis not present

## 2018-10-30 DIAGNOSIS — Q612 Polycystic kidney, adult type: Secondary | ICD-10-CM | POA: Diagnosis not present

## 2018-10-30 DIAGNOSIS — Z992 Dependence on renal dialysis: Secondary | ICD-10-CM | POA: Diagnosis not present

## 2018-10-30 DIAGNOSIS — N186 End stage renal disease: Secondary | ICD-10-CM | POA: Diagnosis not present

## 2018-10-31 DIAGNOSIS — N186 End stage renal disease: Secondary | ICD-10-CM | POA: Diagnosis not present

## 2018-10-31 DIAGNOSIS — N2581 Secondary hyperparathyroidism of renal origin: Secondary | ICD-10-CM | POA: Diagnosis not present

## 2018-11-02 DIAGNOSIS — N186 End stage renal disease: Secondary | ICD-10-CM | POA: Diagnosis not present

## 2018-11-02 DIAGNOSIS — N2581 Secondary hyperparathyroidism of renal origin: Secondary | ICD-10-CM | POA: Diagnosis not present

## 2018-11-05 DIAGNOSIS — N186 End stage renal disease: Secondary | ICD-10-CM | POA: Diagnosis not present

## 2018-11-05 DIAGNOSIS — N2581 Secondary hyperparathyroidism of renal origin: Secondary | ICD-10-CM | POA: Diagnosis not present

## 2018-11-07 DIAGNOSIS — N2581 Secondary hyperparathyroidism of renal origin: Secondary | ICD-10-CM | POA: Diagnosis not present

## 2018-11-07 DIAGNOSIS — N186 End stage renal disease: Secondary | ICD-10-CM | POA: Diagnosis not present

## 2018-11-09 DIAGNOSIS — N186 End stage renal disease: Secondary | ICD-10-CM | POA: Diagnosis not present

## 2018-11-09 DIAGNOSIS — N2581 Secondary hyperparathyroidism of renal origin: Secondary | ICD-10-CM | POA: Diagnosis not present

## 2018-11-12 DIAGNOSIS — N2581 Secondary hyperparathyroidism of renal origin: Secondary | ICD-10-CM | POA: Diagnosis not present

## 2018-11-12 DIAGNOSIS — N186 End stage renal disease: Secondary | ICD-10-CM | POA: Diagnosis not present

## 2018-11-14 DIAGNOSIS — N186 End stage renal disease: Secondary | ICD-10-CM | POA: Diagnosis not present

## 2018-11-14 DIAGNOSIS — N2581 Secondary hyperparathyroidism of renal origin: Secondary | ICD-10-CM | POA: Diagnosis not present

## 2018-11-16 DIAGNOSIS — N2581 Secondary hyperparathyroidism of renal origin: Secondary | ICD-10-CM | POA: Diagnosis not present

## 2018-11-16 DIAGNOSIS — N186 End stage renal disease: Secondary | ICD-10-CM | POA: Diagnosis not present

## 2018-11-19 DIAGNOSIS — N186 End stage renal disease: Secondary | ICD-10-CM | POA: Diagnosis not present

## 2018-11-19 DIAGNOSIS — N2581 Secondary hyperparathyroidism of renal origin: Secondary | ICD-10-CM | POA: Diagnosis not present

## 2018-11-21 DIAGNOSIS — N2581 Secondary hyperparathyroidism of renal origin: Secondary | ICD-10-CM | POA: Diagnosis not present

## 2018-11-21 DIAGNOSIS — N186 End stage renal disease: Secondary | ICD-10-CM | POA: Diagnosis not present

## 2018-11-23 DIAGNOSIS — N186 End stage renal disease: Secondary | ICD-10-CM | POA: Diagnosis not present

## 2018-11-23 DIAGNOSIS — N2581 Secondary hyperparathyroidism of renal origin: Secondary | ICD-10-CM | POA: Diagnosis not present

## 2018-11-24 DIAGNOSIS — N2581 Secondary hyperparathyroidism of renal origin: Secondary | ICD-10-CM | POA: Diagnosis not present

## 2018-11-24 DIAGNOSIS — N186 End stage renal disease: Secondary | ICD-10-CM | POA: Diagnosis not present

## 2018-11-24 DIAGNOSIS — E8770 Fluid overload, unspecified: Secondary | ICD-10-CM | POA: Diagnosis not present

## 2018-11-26 ENCOUNTER — Other Ambulatory Visit (INDEPENDENT_AMBULATORY_CARE_PROVIDER_SITE_OTHER): Payer: Self-pay | Admitting: Ophthalmology

## 2018-11-26 DIAGNOSIS — N186 End stage renal disease: Secondary | ICD-10-CM | POA: Diagnosis not present

## 2018-11-26 DIAGNOSIS — N2581 Secondary hyperparathyroidism of renal origin: Secondary | ICD-10-CM | POA: Diagnosis not present

## 2018-11-28 DIAGNOSIS — N2581 Secondary hyperparathyroidism of renal origin: Secondary | ICD-10-CM | POA: Diagnosis not present

## 2018-11-28 DIAGNOSIS — N186 End stage renal disease: Secondary | ICD-10-CM | POA: Diagnosis not present

## 2018-11-30 DIAGNOSIS — Q612 Polycystic kidney, adult type: Secondary | ICD-10-CM | POA: Diagnosis not present

## 2018-11-30 DIAGNOSIS — N186 End stage renal disease: Secondary | ICD-10-CM | POA: Diagnosis not present

## 2018-11-30 DIAGNOSIS — Z992 Dependence on renal dialysis: Secondary | ICD-10-CM | POA: Diagnosis not present

## 2018-11-30 DIAGNOSIS — N2581 Secondary hyperparathyroidism of renal origin: Secondary | ICD-10-CM | POA: Diagnosis not present

## 2018-12-01 DIAGNOSIS — Q612 Polycystic kidney, adult type: Secondary | ICD-10-CM | POA: Diagnosis not present

## 2018-12-01 DIAGNOSIS — Z992 Dependence on renal dialysis: Secondary | ICD-10-CM | POA: Diagnosis not present

## 2018-12-01 DIAGNOSIS — N186 End stage renal disease: Secondary | ICD-10-CM | POA: Diagnosis not present

## 2018-12-03 ENCOUNTER — Telehealth (INDEPENDENT_AMBULATORY_CARE_PROVIDER_SITE_OTHER): Payer: Self-pay

## 2018-12-03 ENCOUNTER — Other Ambulatory Visit (INDEPENDENT_AMBULATORY_CARE_PROVIDER_SITE_OTHER): Payer: Self-pay

## 2018-12-03 DIAGNOSIS — N186 End stage renal disease: Secondary | ICD-10-CM | POA: Diagnosis not present

## 2018-12-03 DIAGNOSIS — N2581 Secondary hyperparathyroidism of renal origin: Secondary | ICD-10-CM | POA: Diagnosis not present

## 2018-12-03 DIAGNOSIS — Z992 Dependence on renal dialysis: Secondary | ICD-10-CM | POA: Diagnosis not present

## 2018-12-05 DIAGNOSIS — N186 End stage renal disease: Secondary | ICD-10-CM | POA: Diagnosis not present

## 2018-12-05 DIAGNOSIS — N2581 Secondary hyperparathyroidism of renal origin: Secondary | ICD-10-CM | POA: Diagnosis not present

## 2018-12-05 DIAGNOSIS — Z992 Dependence on renal dialysis: Secondary | ICD-10-CM | POA: Diagnosis not present

## 2018-12-07 DIAGNOSIS — N186 End stage renal disease: Secondary | ICD-10-CM | POA: Diagnosis not present

## 2018-12-07 DIAGNOSIS — N2581 Secondary hyperparathyroidism of renal origin: Secondary | ICD-10-CM | POA: Diagnosis not present

## 2018-12-07 DIAGNOSIS — Z992 Dependence on renal dialysis: Secondary | ICD-10-CM | POA: Diagnosis not present

## 2018-12-11 DIAGNOSIS — N2581 Secondary hyperparathyroidism of renal origin: Secondary | ICD-10-CM | POA: Diagnosis not present

## 2018-12-11 DIAGNOSIS — N186 End stage renal disease: Secondary | ICD-10-CM | POA: Diagnosis not present

## 2018-12-11 DIAGNOSIS — Z992 Dependence on renal dialysis: Secondary | ICD-10-CM | POA: Diagnosis not present

## 2018-12-11 DIAGNOSIS — T82868A Thrombosis of vascular prosthetic devices, implants and grafts, initial encounter: Secondary | ICD-10-CM | POA: Diagnosis not present

## 2018-12-13 DIAGNOSIS — N2581 Secondary hyperparathyroidism of renal origin: Secondary | ICD-10-CM | POA: Diagnosis not present

## 2018-12-13 DIAGNOSIS — Z992 Dependence on renal dialysis: Secondary | ICD-10-CM | POA: Diagnosis not present

## 2018-12-13 DIAGNOSIS — N186 End stage renal disease: Secondary | ICD-10-CM | POA: Diagnosis not present

## 2018-12-14 DIAGNOSIS — N2581 Secondary hyperparathyroidism of renal origin: Secondary | ICD-10-CM | POA: Diagnosis not present

## 2018-12-14 DIAGNOSIS — Z992 Dependence on renal dialysis: Secondary | ICD-10-CM | POA: Diagnosis not present

## 2018-12-14 DIAGNOSIS — N186 End stage renal disease: Secondary | ICD-10-CM | POA: Diagnosis not present

## 2018-12-17 DIAGNOSIS — N2581 Secondary hyperparathyroidism of renal origin: Secondary | ICD-10-CM | POA: Diagnosis not present

## 2018-12-17 DIAGNOSIS — Z992 Dependence on renal dialysis: Secondary | ICD-10-CM | POA: Diagnosis not present

## 2018-12-17 DIAGNOSIS — N186 End stage renal disease: Secondary | ICD-10-CM | POA: Diagnosis not present

## 2018-12-19 DIAGNOSIS — Z992 Dependence on renal dialysis: Secondary | ICD-10-CM | POA: Diagnosis not present

## 2018-12-19 DIAGNOSIS — N186 End stage renal disease: Secondary | ICD-10-CM | POA: Diagnosis not present

## 2018-12-19 DIAGNOSIS — N2581 Secondary hyperparathyroidism of renal origin: Secondary | ICD-10-CM | POA: Diagnosis not present

## 2018-12-21 DIAGNOSIS — Z992 Dependence on renal dialysis: Secondary | ICD-10-CM | POA: Diagnosis not present

## 2018-12-21 DIAGNOSIS — N186 End stage renal disease: Secondary | ICD-10-CM | POA: Diagnosis not present

## 2018-12-21 DIAGNOSIS — N2581 Secondary hyperparathyroidism of renal origin: Secondary | ICD-10-CM | POA: Diagnosis not present

## 2018-12-24 DIAGNOSIS — N2581 Secondary hyperparathyroidism of renal origin: Secondary | ICD-10-CM | POA: Diagnosis not present

## 2018-12-24 DIAGNOSIS — Z992 Dependence on renal dialysis: Secondary | ICD-10-CM | POA: Diagnosis not present

## 2018-12-24 DIAGNOSIS — N186 End stage renal disease: Secondary | ICD-10-CM | POA: Diagnosis not present

## 2018-12-26 DIAGNOSIS — Z992 Dependence on renal dialysis: Secondary | ICD-10-CM | POA: Diagnosis not present

## 2018-12-26 DIAGNOSIS — N186 End stage renal disease: Secondary | ICD-10-CM | POA: Diagnosis not present

## 2018-12-26 DIAGNOSIS — N2581 Secondary hyperparathyroidism of renal origin: Secondary | ICD-10-CM | POA: Diagnosis not present

## 2018-12-28 DIAGNOSIS — Z992 Dependence on renal dialysis: Secondary | ICD-10-CM | POA: Diagnosis not present

## 2018-12-28 DIAGNOSIS — N186 End stage renal disease: Secondary | ICD-10-CM | POA: Diagnosis not present

## 2018-12-28 DIAGNOSIS — N2581 Secondary hyperparathyroidism of renal origin: Secondary | ICD-10-CM | POA: Diagnosis not present

## 2018-12-31 DIAGNOSIS — N2581 Secondary hyperparathyroidism of renal origin: Secondary | ICD-10-CM | POA: Diagnosis not present

## 2018-12-31 DIAGNOSIS — Z992 Dependence on renal dialysis: Secondary | ICD-10-CM | POA: Diagnosis not present

## 2018-12-31 DIAGNOSIS — N186 End stage renal disease: Secondary | ICD-10-CM | POA: Diagnosis not present

## 2019-01-01 DIAGNOSIS — N186 End stage renal disease: Secondary | ICD-10-CM | POA: Diagnosis not present

## 2019-01-01 DIAGNOSIS — Q612 Polycystic kidney, adult type: Secondary | ICD-10-CM | POA: Diagnosis not present

## 2019-01-01 DIAGNOSIS — Z992 Dependence on renal dialysis: Secondary | ICD-10-CM | POA: Diagnosis not present

## 2019-01-02 DIAGNOSIS — Z23 Encounter for immunization: Secondary | ICD-10-CM | POA: Diagnosis not present

## 2019-01-02 DIAGNOSIS — Z992 Dependence on renal dialysis: Secondary | ICD-10-CM | POA: Diagnosis not present

## 2019-01-02 DIAGNOSIS — N2581 Secondary hyperparathyroidism of renal origin: Secondary | ICD-10-CM | POA: Diagnosis not present

## 2019-01-02 DIAGNOSIS — N186 End stage renal disease: Secondary | ICD-10-CM | POA: Diagnosis not present

## 2019-01-04 DIAGNOSIS — Z23 Encounter for immunization: Secondary | ICD-10-CM | POA: Diagnosis not present

## 2019-01-04 DIAGNOSIS — N186 End stage renal disease: Secondary | ICD-10-CM | POA: Diagnosis not present

## 2019-01-04 DIAGNOSIS — Z992 Dependence on renal dialysis: Secondary | ICD-10-CM | POA: Diagnosis not present

## 2019-01-04 DIAGNOSIS — N2581 Secondary hyperparathyroidism of renal origin: Secondary | ICD-10-CM | POA: Diagnosis not present

## 2019-01-07 DIAGNOSIS — Z23 Encounter for immunization: Secondary | ICD-10-CM | POA: Diagnosis not present

## 2019-01-07 DIAGNOSIS — N186 End stage renal disease: Secondary | ICD-10-CM | POA: Diagnosis not present

## 2019-01-07 DIAGNOSIS — N2581 Secondary hyperparathyroidism of renal origin: Secondary | ICD-10-CM | POA: Diagnosis not present

## 2019-01-07 DIAGNOSIS — Z992 Dependence on renal dialysis: Secondary | ICD-10-CM | POA: Diagnosis not present

## 2019-01-08 ENCOUNTER — Other Ambulatory Visit (HOSPITAL_COMMUNITY)
Admission: RE | Admit: 2019-01-08 | Discharge: 2019-01-08 | Disposition: A | Discharge status: Home / Self Care | Payer: Medicare Other | Source: Ambulatory Visit | Attending: Vascular Surgery | Admitting: Vascular Surgery

## 2019-01-08 ENCOUNTER — Encounter: Payer: Self-pay | Admitting: Vascular Surgery

## 2019-01-08 ENCOUNTER — Other Ambulatory Visit: Payer: Self-pay

## 2019-01-08 ENCOUNTER — Ambulatory Visit (INDEPENDENT_AMBULATORY_CARE_PROVIDER_SITE_OTHER): Payer: Medicare Other | Admitting: Vascular Surgery

## 2019-01-08 ENCOUNTER — Encounter: Payer: Self-pay | Admitting: *Deleted

## 2019-01-08 VITALS — BP 117/82 | HR 95 | Temp 97.8°F | Resp 16 | Ht 67.5 in | Wt 180.0 lb

## 2019-01-08 DIAGNOSIS — Z01812 Encounter for preprocedural laboratory examination: Secondary | ICD-10-CM | POA: Insufficient documentation

## 2019-01-08 DIAGNOSIS — N186 End stage renal disease: Secondary | ICD-10-CM | POA: Diagnosis not present

## 2019-01-08 DIAGNOSIS — Z992 Dependence on renal dialysis: Secondary | ICD-10-CM | POA: Diagnosis not present

## 2019-01-08 DIAGNOSIS — Z20828 Contact with and (suspected) exposure to other viral communicable diseases: Secondary | ICD-10-CM | POA: Insufficient documentation

## 2019-01-08 LAB — SARS CORONAVIRUS 2 (TAT 6-24 HRS): SARS Coronavirus 2: NEGATIVE

## 2019-01-08 NOTE — Progress Notes (Signed)
Patient name: Michael [name] [name]: [id number] DOB: May 06, 1955 Sex: male  REASON FOR VISIT: Evaluate left arm AVF access  HPI: Michael [name] is a 63 y.o. male with multiple medical problems including end-stage renal disease that has dialysis on Monday Wednesday Friday that presents for evaluation of his left arm access.  Patient initially had a brachiocephalic fistula that ultimately was ligated and excised 12/27/2016.  Most recently had a brachiobasilic fistula in the left arm placed on 01/31/2017 by Dr. Bridgett Larsson.  He states this worked well for several years.  He started having trouble with the access back in August.  Ultimately he went to [facility] and had a right IJ tunneled catheter placed and he was told that his fistula was likely no longer usable.  He has a large aneurysm over the fistula with some ulcerated segments of skin as well.  He's had no previous right arm access.  Past Medical History:  Diagnosis Date  . Cataract   . Dyspnea    on exertion  . ESRD (end stage renal disease) ([place])    Hemo- MWF, Polycystic kidney disease  . Fatigue   . History of kidney stones    removal of stone- cysto  . Hyperlipidemia   . Hyperparathyroidism, secondary renal ([place])   . Hypertension   . Hypoxemia 12/12/2013  . Nonischemic cardiomyopathy ([place])    Er 25% 2015, 55 % 2013  . OSA on CPAP    no longer using cpap  . OSA on CPAP 03/24/2014  . Pneumonia    2015ish  . Ventricular tachycardia//Freq PVCs     Past Surgical History:  Procedure Laterality Date  . A/V FISTULAGRAM Left 04/27/2017   Procedure: A/V FISTULAGRAM;  Surgeon: Conrad Lukachukai, MD;  Location: [facility];  Service: Cardiovascular;  Laterality: Left;  lt arm  . APPENDECTOMY    . AV FISTULA PLACEMENT  12/05/2011   Procedure: ARTERIOVENOUS (AV) FISTULA CREATION;LLEFT ARM  Surgeon: Conrad Catron, MD;  Location: [facility];  Service: Vascular;  Laterality: Left;  RADIO-CEPHALIC  fistula left arm  . AV FISTULA PLACEMENT  01/11/2012   Procedure: ARTERIOVENOUS (AV) FISTULA CREATION;  Surgeon: Conrad Bayard, MD;  Location: [facility];  Service: Vascular;  Laterality: Left;  Creation of left brachial cephalic arteriovenous fistula  . BASCILIC VEIN TRANSPOSITION Left 12/27/2016   Procedure: BASILIC VEIN TRANSPOSITION LEFT UPPER ARM FIRST STAGE;  Surgeon: Conrad Creekside, MD;  Location: [facility];  Service: Vascular;  Laterality: Left;  . BASCILIC VEIN TRANSPOSITION Left 01/31/2017   Procedure: LEFT ARM BASILIC VEIN TRANSPOSITION, SECOND STAGE;  Surgeon: Conrad Eupora, MD;  Location: [facility];  Service: Vascular;  Laterality: Left;  . CARDIAC CATHETERIZATION  04-05-2010   checking for blockage but none-[facility]  . COLONOSCOPY    . CYSTOSCOPY W/ STONE MANIPULATION     "laser once" (01/22/2013)  . HEMATOMA EVACUATION Left 05/09/2017   Procedure: EVACUATION HEMATOMA LEFT ARM;  Surgeon: Conrad Arcata, MD;  Location: [facility];  Service: Vascular;  Laterality: Left;  . HERNIA REPAIR    . INGUINAL HERNIA REPAIR Right 11/06/2015   Procedure: OPEN HERNIA REPAIR  RIGHT INGUINAL ADULT;  Surgeon: [name], MD;  Location: [facility];  Service: General;  Laterality: Right;  with MESH  . INSERTION OF DIALYSIS CATHETER Right 10/05/2016   Procedure: INSERTION OF right internal jugular DIALYSIS CATHETER;  Surgeon: [name], MD;  Location: [facility];  Service: Vascular;  Laterality: Right;  . INSERTION OF  MESH  03/20/2012   Procedure: INSERTION OF MESH;  VOZ Surgeon: [name], MD;  Location: [facility];  Service: General;  Laterality: N/A;  . INSERTION OF MESH N/A 01/22/2013   Procedure: INSERTION OF MESH;  Surgeon: [name], MD;  Location: [facility];  Service: General;  Laterality: N/A;  . LAPAROTOMY  04/02/2012   Procedure: EXPLORATORY LAPAROTOMY;  Surgeon: [name], MD;  Location: [place];  Service: General;  Laterality: N/A;  Exploratory Laparotomy with resection of small intestine  . LEFT HEART CATHETERIZATION WITH CORONARY ANGIOGRAM N/A 05/14/2013    Procedure: LEFT HEART CATHETERIZATION WITH CORONARY ANGIOGRAM;  Surgeon: Sinclair Grooms, MD;  Location: [facility];  Service: Cardiovascular;  Laterality: N/A;  . LIGATION OF ARTERIOVENOUS  FISTULA Left 12/27/2016   Procedure: LIGATION/EXCISION OF LEFT UPPER ARM ARTERIOVENOUS  FISTULA;  EVACUATION OF HEMATOMA;  Surgeon: Conrad [place] Alfred, MD;  Location: [place];  Service: Vascular;  Laterality: Left;  . REVISON OF ARTERIOVENOUS FISTULA Left 10/05/2016   Procedure: REVISON OF left arm ARTERIOVENOUS FISTULA;  Surgeon: [name], MD;  Location: [facility];  Service: Vascular;  Laterality: Left;  . TONSILLECTOMY    . UMBILICAL HERNIA REPAIR  03/20/2012   Procedure: HERNIA REPAIR UMBILICAL ADULT;  Surgeon: [name], MD;  Location: [place];  Service: General;  Laterality: N/A;  . UMBILICAL HERNIA REPAIR  01/22/2013   preperitoneal open procedure due to significant adhesions/notes 01/22/2013  . VENTRAL HERNIA REPAIR N/A 01/22/2013   Procedure: ATTEMPTED LAPAROSCOPIC VENTRAL HERNIA CONVERTED TO OPEN;  Surgeon: [name], MD;  Location[facility];  Service: General;  Laterality: N/A;    Family History  Problem Relation Age of Onset  . Heart disease Mother   . Hyperlipidemia Mother   . Hypertension Mother   . Kidney disease Father   . Stroke Father   . Kidney disease Brother   . Amblyopia Neg Hx   . Blindness Neg Hx   . Cataracts Neg Hx   . Diabetes Neg Hx   . Glaucoma Neg Hx   . Macular degeneration Neg Hx   . Retinal detachment Neg Hx   . Strabismus Neg Hx   . Retinitis pigmentosa Neg Hx     SOCIAL HISTORY: Social History   Tobacco Use  . Smoking status: Never Smoker  . Smokeless tobacco: Never Used  Substance Use Topics  . Alcohol use: No    Alcohol/week: 0.0 standard drinks    No Known Allergies  Current Outpatient Medications  Medication Sig Dispense Refill  . acyclovir (ZOVIRAX) 400 MG tablet     . aspirin EC 81 MG EC tablet Take 1 tablet (81 mg total) by mouth daily.     [name] atorvastatin (LIPITOR) 40 MG tablet Take 40 mg by mouth every evening.     . calcium acetate (PHOSLO) 667 MG capsule Take 1,334 mg by mouth 3 (three) times daily with meals.   6  . dorzolamide-timolol (COSOPT) 22.3-6.8 MG/ML ophthalmic solution Place 1 drop into both eyes 2 (two) times daily. (Patient not taking: Reported on 01/08/2019) 10 mL 3  . Ganciclovir (ZIRGAN) 0.15 % GEL Place 1 drop into the right eye 5 (five) times daily. (Patient not taking: Reported on 01/08/2019) 5 g 1  . metoprolol succinate (TOPROL-XL) 100 MG 24 hr tablet Take 50 mg by mouth See admin instructions. Take 50 mg by mouth daily on Tuesday, Thursday and Saturday.    . nitroGLYCERIN (NITROSTAT) 0.4 MG SL tablet Place 1 tablet (  0.4 mg total) under the tongue every 5 (five) minutes x 3 doses as needed for chest pain. (Patient not taking: Reported on 01/08/2019) 30 tablet 12  . prednisoLONE acetate (PRED FORTE) 1 % ophthalmic suspension Place 1 drop into the right eye 2 (two) times daily. (Patient not taking: Reported on 01/08/2019) 15 mL 0  . RENVELA 800 MG tablet     . SENSIPAR 60 MG tablet Take 60 mg by mouth every evening.   5   Current Facility-Administered Medications  Medication Dose Route Frequency Provider Last Rate Last Dose  . Bevacizumab (AVASTIN) SOLN 1.25 mg  1.25 mg Intravitreal  [name], MD   1.25 mg at 04/21/17 1028  . Bevacizumab (AVASTIN) SOLN 1.25 mg  1.25 mg Intravitreal  [name], MD   1.25 mg at 05/19/17 1117  . Bevacizumab (AVASTIN) SOLN 1.25 mg  1.25 mg Intravitreal  [name], MD   1.25 mg at 06/16/17 1124  . Bevacizumab (AVASTIN) SOLN 1.25 mg  1.25 mg Intravitreal  [name], MD   1.25 mg at 08/01/17 0842  . Bevacizumab (AVASTIN) SOLN 1.25 mg  1.25 mg Intravitreal  [name], MD   1.25 mg at 08/31/17 2203    REVIEW OF SYSTEMS:  [X]  denotes positive finding, [ ]  denotes negative finding Cardiac  Comments:  Chest pain or chest pressure:    Shortness of breath upon exertion:     Short of breath when lying flat:    Irregular heart rhythm:        Vascular    Pain in calf, thigh, or hip brought on by ambulation:    Pain in feet at night that wakes you up from your sleep:     Blood clot in your veins:    Leg swelling:         Pulmonary    Oxygen at home:    Productive cough:     Wheezing:         Neurologic    Sudden weakness in arms or legs:     Sudden numbness in arms or legs:     Sudden onset of difficulty speaking or slurred speech:    Temporary loss of vision in one eye:     Problems with dizziness:         Gastrointestinal    Blood in stool:     Vomited blood:         Genitourinary    Burning when urinating:     Blood in urine:        Psychiatric    Major depression:         Hematologic    Bleeding problems:    Problems with blood clotting too easily:        Skin    Rashes or ulcers:        Constitutional    Fever or chills:      PHYSICAL EXAM: Vitals:   01/08/19 0826  BP: 117/82  Pulse: 95  Resp: 16  Temp: 97.8 F (36.6 C)  TempSrc: Temporal  SpO2: 100%  Weight: 180 lb (81.6 kg)  Height: 5' 7.5" (1.715 m)    GENERAL: The patient is a well-nourished male, in no acute distress. The vital signs are documented above. CARDIAC: There is a regular rate and rhythm.  VASCULAR:  Left radial and brachial pulse palpable Incision from ligated and incised left brachicephalic AVF Left brachiobasilic AVF pulsatile, no thrill, large aneurysm, two ulcerated skin segments  DATA:  None  Assessment/Plan:  63 year old male with end-stage renal disease on dialysis Monday Wednesday Friday that presents for evaluation of left arm brachiobasilic fistula.  As noted above he has large aneurysm in the mid upper arm with several ulcerated segments and the fistula itself is pulsatile.  In reviewing notes from [facility] it appears they felt there was too much thrombus burden in the fistula and it was not salvageable.  He had a RIJ tunneled  cathter placed by [facility].  Discussed plan for left arm fistulogram to evaluate options that could include aneurysm plication and removal of some of the thrombus and resection of the ulcerated skin if it looks salvageable.  He may ultimately require left arm graft if it is not salvageable and/or right arm access.  Discussed that this may not be salvageable but would certainly need better imaging to make that determination.  We will arrange for Thursday which is a nondialysis day.   Marty Heck, MD [facility] Office: [phone] Pager: [phone]

## 2019-01-09 DIAGNOSIS — Z992 Dependence on renal dialysis: Secondary | ICD-10-CM | POA: Diagnosis not present

## 2019-01-09 DIAGNOSIS — N2581 Secondary hyperparathyroidism of renal origin: Secondary | ICD-10-CM | POA: Diagnosis not present

## 2019-01-09 DIAGNOSIS — N186 End stage renal disease: Secondary | ICD-10-CM | POA: Diagnosis not present

## 2019-01-09 DIAGNOSIS — Z23 Encounter for immunization: Secondary | ICD-10-CM | POA: Diagnosis not present

## 2019-01-10 ENCOUNTER — Ambulatory Visit (HOSPITAL_BASED_OUTPATIENT_CLINIC_OR_DEPARTMENT_OTHER): Payer: Medicare Other

## 2019-01-10 ENCOUNTER — Ambulatory Visit (HOSPITAL_COMMUNITY)
Admission: RE | Admit: 2019-01-10 | Discharge: 2019-01-10 | Disposition: A | Discharge status: Home / Self Care | Payer: Medicare Other | Attending: Vascular Surgery | Admitting: Vascular Surgery

## 2019-01-10 ENCOUNTER — Other Ambulatory Visit: Payer: Self-pay

## 2019-01-10 ENCOUNTER — Encounter (HOSPITAL_COMMUNITY)
Admission: RE | Disposition: A | Discharge status: Home / Self Care | Payer: Self-pay | Source: Home / Self Care | Attending: Vascular Surgery

## 2019-01-10 ENCOUNTER — Encounter (HOSPITAL_COMMUNITY): Payer: Self-pay | Admitting: Vascular Surgery

## 2019-01-10 ENCOUNTER — Other Ambulatory Visit: Payer: Self-pay | Admitting: *Deleted

## 2019-01-10 DIAGNOSIS — N186 End stage renal disease: Secondary | ICD-10-CM | POA: Diagnosis not present

## 2019-01-10 DIAGNOSIS — Q613 Polycystic kidney, unspecified: Secondary | ICD-10-CM | POA: Insufficient documentation

## 2019-01-10 DIAGNOSIS — I120 Hypertensive chronic kidney disease with stage 5 chronic kidney disease or end stage renal disease: Secondary | ICD-10-CM | POA: Insufficient documentation

## 2019-01-10 DIAGNOSIS — E785 Hyperlipidemia, unspecified: Secondary | ICD-10-CM | POA: Diagnosis not present

## 2019-01-10 DIAGNOSIS — T82898A Other specified complication of vascular prosthetic devices, implants and grafts, initial encounter: Secondary | ICD-10-CM | POA: Diagnosis not present

## 2019-01-10 DIAGNOSIS — Z79899 Other long term (current) drug therapy: Secondary | ICD-10-CM | POA: Diagnosis not present

## 2019-01-10 DIAGNOSIS — T82868A Thrombosis of vascular prosthetic devices, implants and grafts, initial encounter: Secondary | ICD-10-CM | POA: Diagnosis not present

## 2019-01-10 DIAGNOSIS — Z7982 Long term (current) use of aspirin: Secondary | ICD-10-CM | POA: Insufficient documentation

## 2019-01-10 DIAGNOSIS — Z8249 Family history of ischemic heart disease and other diseases of the circulatory system: Secondary | ICD-10-CM | POA: Insufficient documentation

## 2019-01-10 DIAGNOSIS — G4733 Obstructive sleep apnea (adult) (pediatric): Secondary | ICD-10-CM | POA: Insufficient documentation

## 2019-01-10 DIAGNOSIS — N2581 Secondary hyperparathyroidism of renal origin: Secondary | ICD-10-CM | POA: Diagnosis not present

## 2019-01-10 DIAGNOSIS — I428 Other cardiomyopathies: Secondary | ICD-10-CM | POA: Diagnosis not present

## 2019-01-10 DIAGNOSIS — Z992 Dependence on renal dialysis: Secondary | ICD-10-CM | POA: Insufficient documentation

## 2019-01-10 DIAGNOSIS — Y841 Kidney dialysis as the cause of abnormal reaction of the patient, or of later complication, without mention of misadventure at the time of the procedure: Secondary | ICD-10-CM | POA: Insufficient documentation

## 2019-01-10 HISTORY — PX: A/V FISTULAGRAM: CATH118298

## 2019-01-10 LAB — POCT I-STAT, CHEM 8
BUN: 21 mg/dL (ref 8–23)
Calcium, Ion: 1.21 mmol/L (ref 1.15–1.40)
Chloride: 101 mmol/L (ref 98–111)
Creatinine, Ser: 6.1 mg/dL — ABNORMAL HIGH (ref 0.61–1.24)
Glucose, Bld: 88 mg/dL (ref 70–99)
HCT: 41 % (ref 39.0–52.0)
Hemoglobin: 13.9 g/dL (ref 13.0–17.0)
Potassium: 3.7 mmol/L (ref 3.5–5.1)
Sodium: 137 mmol/L (ref 135–145)
TCO2: 25 mmol/L (ref 22–32)

## 2019-01-10 SURGERY — A/V FISTULAGRAM
Anesthesia: LOCAL | Laterality: Left

## 2019-01-10 MED ORDER — LIDOCAINE HCL (PF) 1 % IJ SOLN
INTRAMUSCULAR | Status: DC | PRN
  Administered 2019-01-10: 3 mL

## 2019-01-10 MED ORDER — SODIUM CHLORIDE 0.9 % IV SOLN: 250.0000 mL | INTRAVENOUS | Status: DC | PRN

## 2019-01-10 MED ORDER — LIDOCAINE HCL (PF) 1 % IJ SOLN
INTRAMUSCULAR | Status: AC
  Filled 2019-01-10: qty 30

## 2019-01-10 MED ORDER — SODIUM CHLORIDE 0.9% FLUSH: 3.0000 mL | Freq: Two times a day (BID) | INTRAVENOUS | Status: DC

## 2019-01-10 MED ORDER — HEPARIN (PORCINE) IN NACL 1000-0.9 UT/500ML-% IV SOLN
INTRAVENOUS | Status: AC
  Filled 2019-01-10: qty 500

## 2019-01-10 MED ORDER — SODIUM CHLORIDE 0.9% FLUSH: 3.0000 mL | INTRAVENOUS | Status: DC | PRN

## 2019-01-10 MED ORDER — IODIXANOL 320 MG/ML IV SOLN
INTRAVENOUS | Status: DC | PRN
  Administered 2019-01-10: 35 mL via INTRAVENOUS

## 2019-01-10 MED ORDER — HEPARIN (PORCINE) IN NACL 1000-0.9 UT/500ML-% IV SOLN
INTRAVENOUS | Status: DC | PRN
  Administered 2019-01-10: 500 mL

## 2019-01-10 SURGICAL SUPPLY — 8 items
COVER DOME SNAP 22 D (MISCELLANEOUS) ×2 IMPLANT
KIT MICROPUNCTURE NIT STIFF (SHEATH) ×1 IMPLANT
PROTECTION STATION PRESSURIZED (MISCELLANEOUS) ×2
SHEATH PROBE COVER 6X72 (BAG) ×1 IMPLANT
STATION PROTECTION PRESSURIZED (MISCELLANEOUS) ×1 IMPLANT
STOPCOCK MORSE 400PSI 3WAY (MISCELLANEOUS) ×2 IMPLANT
TRAY PV CATH (CUSTOM PROCEDURE TRAY) ×2 IMPLANT
TUBING CIL FLEX 10 FLL-RA (TUBING) ×2 IMPLANT

## 2019-01-10 NOTE — Discharge Instructions (Signed)

## 2019-01-10 NOTE — Progress Notes (Signed)
Right upper extremity vein mapping completed. Preliminary results in Chart review CV Proc. [facility], [place]  01/10/2019 3:36 PM

## 2019-01-10 NOTE — Op Note (Signed)
    OPERATIVE NOTE   PROCEDURE: 1. left brachiobasilic arteriovenous fistula cannulation under ultrasound guidance 2. left arm fistulogram including central venogram  PRE-OPERATIVE DIAGNOSIS: Malfunctioning left arteriovenous fistula  POST-OPERATIVE DIAGNOSIS: same as above   SURGEON: Marty Heck, MD  ANESTHESIA: local  ESTIMATED BLOOD LOSS: 5 cc  FINDING(S): 1. The left brachiobasilic fistula has a large aneurysmal segment full of thrombus and has no outflow in the left  upper arm and appears occluded.  Contrast refluxed retrograde into the brachial artery with only the proximal portion of the fistula just above the antecubitum patent.  There is no evidence of venous outflow in the left upper arm and no target in the left upper extremity that would make the fistula salvageable.  Discussed we will evaluate his right arm for new AV fistula placement and I can ligate the stump of his left AV fistula given aneurysmal segment with overlying ulceration.  SPECIMEN(S):  None  CONTRAST: 35 cc  INDICATIONS: Michael [name]. is a 63 y.o. male who  presents with malfunctioning left brachiobasilic arteriovenous fistula.  The patient is scheduled for left arm fistulogram.  The patient is aware the risks include but are not limited to: bleeding, infection, thrombosis of the cannulated access, and possible anaphylactic reaction to the contrast.  The patient is aware of the risks of the procedure and elects to proceed forward.  DESCRIPTION: After full informed written consent was obtained, the patient was brought back to the angiography suite and placed supine upon the angiography table.  The patient was connected to monitoring equipment.  The left arm was prepped and draped in the standard fashion for a left arm fistulogram.  Under ultrasound guidance, the left brachiobasilic fistula was evaluated, it was patent, an image was saved.  The arteriovenous fistula was cannulated with a  micropuncture needle under ultrasound guidance.  The microwire was advanced into the fistula and the needle was exchanged for the a microsheath, which was lodged 2 cm into the access.  The wire was removed and the sheath was connected to the IV extension tubing.  Hand injections were completed to image the access from the antecubitum up to the level of axilla.  The central venous structures were also imaged by hand injections.  Based on the images, this patient will need: evaluation of right arm AVF.  A 4-0 Monocryl purse-string suture was sewn around the sheath.  The sheath was removed while tying down the suture.  A sterile bandage was applied to the puncture site.  COMPLICATIONS: None  CONDITION: Stable  Marty Heck, MD [facility] Office: [phone] Pager: [phone]  01/10/2019 8:05 AM

## 2019-01-10 NOTE — Progress Notes (Signed)
Discharge instructions reviewed with pt voices understanding. Teach back complete

## 2019-01-10 NOTE — Progress Notes (Signed)
Pt wife called and updated, vein mapping in process.

## 2019-01-10 NOTE — H&P (Signed)
History and Physical Interval Note:  01/10/2019 7:22 AM  Michael [name].  has presented today for surgery, with the diagnosis of Poor flow in fistula.  The various methods of treatment have been discussed with the patient and family. After consideration of risks, benefits and other options for treatment, the patient has consented to  Procedure(s): A/V FISTULAGRAM (Left) as a surgical intervention.  The patient's history has been reviewed, patient examined, no change in status, stable for surgery.  I have reviewed the patient's chart and labs.  Questions were answered to the patient's satisfaction.     Left arm fistulogram.  Evaluate options for salvage of brachiobasilic fistula.  Marty Heck  Patient name: Michael [name]         [name]: [id number]        DOB: 1955-07-09          Sex: male  REASON FOR VISIT: Evaluate left arm AVF access  HPI: Michael [name] is a 63 y.o. male with multiple medical problems including end-stage renal disease that has dialysis on Monday Wednesday Friday that presents for evaluation of his left arm access.  Patient initially had a brachiocephalic fistula that ultimately was ligated and excised 12/27/2016.  Most recently had a brachiobasilic fistula in the left arm placed on 01/31/2017 by Dr. Bridgett Larsson.  He states this worked well for several years.  He started having trouble with the access back in August.  Ultimately he went to [facility] and had a right IJ tunneled catheter placed and he was told that his fistula was likely no longer usable.  He has a large aneurysm over the fistula with some ulcerated segments of skin as well.  He's had no previous right arm access.      Past Medical History:  Diagnosis Date  . Cataract   . Dyspnea    on exertion  . ESRD (end stage renal disease) ([place])    Hemo- MWF, Polycystic kidney disease  . Fatigue   . History of kidney stones    removal of stone- cysto  . Hyperlipidemia   . Hyperparathyroidism, secondary  renal ([place])   . Hypertension   . Hypoxemia 12/12/2013  . Nonischemic cardiomyopathy ([place])    Er 25% 2015, 55 % 2013  . OSA on CPAP    no longer using cpap  . OSA on CPAP 03/24/2014  . Pneumonia    2015ish  . Ventricular tachycardia//Freq PVCs          Past Surgical History:  Procedure Laterality Date  . A/V FISTULAGRAM Left 04/27/2017   Procedure: A/V FISTULAGRAM;  Surgeon: Conrad Buffalo, MD;  Location: [place] [facility];  Service: Cardiovascular;  Laterality: Left;  lt arm  . APPENDECTOMY    . AV FISTULA PLACEMENT  12/05/2011   Procedure: ARTERIOVENOUS (AV) FISTULA CREATION;LLEFT ARM  Surgeon: Conrad Fyffe, MD;  Location: [place];  Service: Vascular;  Laterality: Left;  RADIO-CEPHALIC  fistula left arm  . AV FISTULA PLACEMENT  01/11/2012   Procedure: ARTERIOVENOUS (AV) FISTULA CREATION;  Surgeon: Conrad Rupert, MD;  Location: [place];  Service: Vascular;  Laterality: Left;  Creation of left brachial cephalic arteriovenous fistula  . BASCILIC VEIN TRANSPOSITION Left 12/27/2016   Procedure: BASILIC VEIN TRANSPOSITION LEFT UPPER ARM FIRST STAGE;  Surgeon: Conrad Oakhurst, MD;  Location: [place];  Service: Vascular;  Laterality: Left;  . BASCILIC VEIN TRANSPOSITION Left 01/31/2017   Procedure: LEFT ARM BASILIC VEIN TRANSPOSITION, SECOND STAGE;  Surgeon: Adele Barthel  L, MD;  Location: [place];  Service: Vascular;  Laterality: Left;  . CARDIAC CATHETERIZATION  04-05-2010   checking for blockage but none-[facility]  . COLONOSCOPY    . CYSTOSCOPY W/ STONE MANIPULATION     "laser once" (01/22/2013)  . HEMATOMA EVACUATION Left 05/09/2017   Procedure: EVACUATION HEMATOMA LEFT ARM;  Surgeon: Conrad Boligee, MD;  Location: [place];  Service: Vascular;  Laterality: Left;  . HERNIA REPAIR    . INGUINAL HERNIA REPAIR Right 11/06/2015   Procedure: OPEN HERNIA REPAIR  RIGHT INGUINAL ADULT;  Surgeon: [name], MD;  Location: [facility];  Service: General;  Laterality: Right;  with MESH  .  INSERTION OF DIALYSIS CATHETER Right 10/05/2016   Procedure: INSERTION OF right internal jugular DIALYSIS CATHETER;  Surgeon: [name], MD;  Location: [place];  Service: Vascular;  Laterality: Right;  . INSERTION OF MESH  03/20/2012   Procedure: INSERTION OF MESH;  UMB Surgeon: [name], MD;  Location: [place];  Service: General;  Laterality: N/A;  . INSERTION OF MESH N/A 01/22/2013   Procedure: INSERTION OF MESH;  Surgeon: [name], MD;  Location: [place];  Service: General;  Laterality: N/A;  . LAPAROTOMY  04/02/2012   Procedure: EXPLORATORY LAPAROTOMY;  Surgeon: [name], MD;  Location: [place];  Service: General;  Laterality: N/A;  Exploratory Laparotomy with resection of small intestine  . LEFT HEART CATHETERIZATION WITH CORONARY ANGIOGRAM N/A 05/14/2013   Procedure: LEFT HEART CATHETERIZATION WITH CORONARY ANGIOGRAM;  Surgeon: Sinclair Grooms, MD;  Location: [facility];  Service: Cardiovascular;  Laterality: N/A;  . LIGATION OF ARTERIOVENOUS  FISTULA Left 12/27/2016   Procedure: LIGATION/EXCISION OF LEFT UPPER ARM ARTERIOVENOUS  FISTULA;  EVACUATION OF HEMATOMA;  Surgeon: Conrad Willis, MD;  Location: [place];  Service: Vascular;  Laterality: Left;  . REVISON OF ARTERIOVENOUS FISTULA Left 10/05/2016   Procedure: REVISON OF left arm ARTERIOVENOUS FISTULA;  Surgeon: [name], MD;  Location: [place];  Service: Vascular;  Laterality: Left;  . TONSILLECTOMY    . UMBILICAL HERNIA REPAIR  03/20/2012   Procedure: HERNIA REPAIR UMBILICAL ADULT;  Surgeon: [name], MD;  Location: [place];  Service: General;  Laterality: N/A;  . UMBILICAL HERNIA REPAIR  01/22/2013   preperitoneal open procedure due to significant adhesions/notes 01/22/2013  . VENTRAL HERNIA REPAIR N/A 01/22/2013   Procedure: ATTEMPTED LAPAROSCOPIC VENTRAL HERNIA CONVERTED TO OPEN;  Surgeon: [name], MD;  Location[facility];  Service: General;  Laterality: N/A;         Family History   Problem Relation Age of Onset  . Heart disease Mother   . Hyperlipidemia Mother   . Hypertension Mother   . Kidney disease Father   . Stroke Father   . Kidney disease Brother   . Amblyopia Neg Hx   . Blindness Neg Hx   . Cataracts Neg Hx   . Diabetes Neg Hx   . Glaucoma Neg Hx   . Macular degeneration Neg Hx   . Retinal detachment Neg Hx   . Strabismus Neg Hx   . Retinitis pigmentosa Neg Hx     SOCIAL HISTORY: Social History        Tobacco Use  . Smoking status: Never Smoker  . Smokeless tobacco: Never Used  Substance Use Topics  . Alcohol use: No    Alcohol/week: 0.0 standard drinks    No Known Allergies  Current Outpatient Medications  Medication Sig Dispense Refill  . acyclovir (  ZOVIRAX) 400 MG tablet     . aspirin EC 81 MG EC tablet Take 1 tablet (81 mg total) by mouth daily.    [name] atorvastatin (LIPITOR) 40 MG tablet Take 40 mg by mouth every evening.     . calcium acetate (PHOSLO) 667 MG capsule Take 1,334 mg by mouth 3 (three) times daily with meals.   6  . dorzolamide-timolol (COSOPT) 22.3-6.8 MG/ML ophthalmic solution Place 1 drop into both eyes 2 (two) times daily. (Patient not taking: Reported on 01/08/2019) 10 mL 3  . Ganciclovir (ZIRGAN) 0.15 % GEL Place 1 drop into the right eye 5 (five) times daily. (Patient not taking: Reported on 01/08/2019) 5 g 1  . metoprolol succinate (TOPROL-XL) 100 MG 24 hr tablet Take 50 mg by mouth See admin instructions. Take 50 mg by mouth daily on Tuesday, Thursday and Saturday.    . nitroGLYCERIN (NITROSTAT) 0.4 MG SL tablet Place 1 tablet (0.4 mg total) under the tongue every 5 (five) minutes x 3 doses as needed for chest pain. (Patient not taking: Reported on 01/08/2019) 30 tablet 12  . prednisoLONE acetate (PRED FORTE) 1 % ophthalmic suspension Place 1 drop into the right eye 2 (two) times daily. (Patient not taking: Reported on 01/08/2019) 15 mL 0  . RENVELA 800 MG tablet     . SENSIPAR 60 MG tablet  Take 60 mg by mouth every evening.   5            Current Facility-Administered Medications  Medication Dose Route Frequency Provider Last Rate Last Dose  . Bevacizumab (AVASTIN) SOLN 1.25 mg  1.25 mg Intravitreal  [name], MD   1.25 mg at 04/21/17 1028  . Bevacizumab (AVASTIN) SOLN 1.25 mg  1.25 mg Intravitreal  [name], MD   1.25 mg at 05/19/17 1117  . Bevacizumab (AVASTIN) SOLN 1.25 mg  1.25 mg Intravitreal  [name], MD   1.25 mg at 06/16/17 1124  . Bevacizumab (AVASTIN) SOLN 1.25 mg  1.25 mg Intravitreal  [name], MD   1.25 mg at 08/01/17 0842  . Bevacizumab (AVASTIN) SOLN 1.25 mg  1.25 mg Intravitreal  [name], MD   1.25 mg at 08/31/17 2203    REVIEW OF SYSTEMS:  [X]  denotes positive finding, [ ]  denotes negative finding Cardiac  Comments:  Chest pain or chest pressure:    Shortness of breath upon exertion:    Short of breath when lying flat:    Irregular heart rhythm:        Vascular    Pain in calf, thigh, or hip brought on by ambulation:    Pain in feet at night that wakes you up from your sleep:     Blood clot in your veins:    Leg swelling:         Pulmonary    Oxygen at home:    Productive cough:     Wheezing:         Neurologic    Sudden weakness in arms or legs:     Sudden numbness in arms or legs:     Sudden onset of difficulty speaking or slurred speech:    Temporary loss of vision in one eye:     Problems with dizziness:         Gastrointestinal    Blood in stool:     Vomited blood:         Genitourinary    Burning when urinating:     Blood in  urine:        Psychiatric    Major depression:         Hematologic    Bleeding problems:    Problems with blood clotting too easily:        Skin    Rashes or ulcers:        Constitutional    Fever or chills:      PHYSICAL EXAM:    Vitals:   01/08/19 0826  BP:  117/82  Pulse: 95  Resp: 16  Temp: 97.8 F (36.6 C)  TempSrc: Temporal  SpO2: 100%  Weight: 180 lb (81.6 kg)  Height: 5' 7.5" (1.715 m)    GENERAL: The patient is a well-nourished male, in no acute distress. The vital signs are documented above. CARDIAC: There is a regular rate and rhythm.  VASCULAR:  Left radial and brachial pulse palpable Incision from ligated and incised left brachicephalic AVF Left brachiobasilic AVF pulsatile, no thrill, large aneurysm, two ulcerated skin segments  DATA:   None  Assessment/Plan:  63 year old male with end-stage renal disease on dialysis Monday Wednesday Friday that presents for evaluation of left arm brachiobasilic fistula.  As noted above he has large aneurysm in the mid upper arm with several ulcerated segments and the fistula itself is pulsatile.  In reviewing notes from [facility] it appears they felt there was too much thrombus burden in the fistula and it was not salvageable.  He had a RIJ tunneled cathter placed by [facility].  Discussed plan for left arm fistulogram to evaluate options that could include aneurysm plication and removal of some of the thrombus and resection of the ulcerated skin if it looks salvageable.  He may ultimately require left arm graft if it is not salvageable and/or right arm access.  Discussed that this may not be salvageable but would certainly need better imaging to make that determination.  We will arrange for Thursday which is a nondialysis day.   Marty Heck, MD [facility] Office: [phone] Pager: [phone]

## 2019-01-10 NOTE — Progress Notes (Signed)
Vascular in to do vein mapping.

## 2019-01-11 DIAGNOSIS — N2581 Secondary hyperparathyroidism of renal origin: Secondary | ICD-10-CM | POA: Diagnosis not present

## 2019-01-11 DIAGNOSIS — N186 End stage renal disease: Secondary | ICD-10-CM | POA: Diagnosis not present

## 2019-01-11 DIAGNOSIS — Z23 Encounter for immunization: Secondary | ICD-10-CM | POA: Diagnosis not present

## 2019-01-11 DIAGNOSIS — Z992 Dependence on renal dialysis: Secondary | ICD-10-CM | POA: Diagnosis not present

## 2019-01-14 DIAGNOSIS — N2581 Secondary hyperparathyroidism of renal origin: Secondary | ICD-10-CM | POA: Diagnosis not present

## 2019-01-14 DIAGNOSIS — Z23 Encounter for immunization: Secondary | ICD-10-CM | POA: Diagnosis not present

## 2019-01-14 DIAGNOSIS — N186 End stage renal disease: Secondary | ICD-10-CM | POA: Diagnosis not present

## 2019-01-14 DIAGNOSIS — Z992 Dependence on renal dialysis: Secondary | ICD-10-CM | POA: Diagnosis not present

## 2019-01-16 DIAGNOSIS — N186 End stage renal disease: Secondary | ICD-10-CM | POA: Diagnosis not present

## 2019-01-16 DIAGNOSIS — Z23 Encounter for immunization: Secondary | ICD-10-CM | POA: Diagnosis not present

## 2019-01-16 DIAGNOSIS — N2581 Secondary hyperparathyroidism of renal origin: Secondary | ICD-10-CM | POA: Diagnosis not present

## 2019-01-16 DIAGNOSIS — Z992 Dependence on renal dialysis: Secondary | ICD-10-CM | POA: Diagnosis not present

## 2019-01-18 DIAGNOSIS — Z23 Encounter for immunization: Secondary | ICD-10-CM | POA: Diagnosis not present

## 2019-01-18 DIAGNOSIS — N2581 Secondary hyperparathyroidism of renal origin: Secondary | ICD-10-CM | POA: Diagnosis not present

## 2019-01-18 DIAGNOSIS — Z992 Dependence on renal dialysis: Secondary | ICD-10-CM | POA: Diagnosis not present

## 2019-01-18 DIAGNOSIS — N186 End stage renal disease: Secondary | ICD-10-CM | POA: Diagnosis not present

## 2019-01-21 DIAGNOSIS — Z23 Encounter for immunization: Secondary | ICD-10-CM | POA: Diagnosis not present

## 2019-01-21 DIAGNOSIS — N2581 Secondary hyperparathyroidism of renal origin: Secondary | ICD-10-CM | POA: Diagnosis not present

## 2019-01-21 DIAGNOSIS — N186 End stage renal disease: Secondary | ICD-10-CM | POA: Diagnosis not present

## 2019-01-21 DIAGNOSIS — Z992 Dependence on renal dialysis: Secondary | ICD-10-CM | POA: Diagnosis not present

## 2019-01-22 NOTE — Progress Notes (Addendum)
[facility] Note  01/24/2019     CHIEF COMPLAINT Patient presents for Retina Evaluation   HISTORY OF PRESENT ILLNESS: Michael Hersch. is a 63 y.o. male who presents to the clinic today for:   HPI    Retina Evaluation    In both eyes.  This started 6 months ago.  Duration of 6 months.  Associated Symptoms Negative for Flashes, Blind Spot, Photophobia, Scalp Tenderness, Fever, Floaters, Pain, Glare, Jaw Claudication, Weight Loss, Distortion, Redness, Trauma, Shoulder/Hip pain and Fatigue.  Context:  distance vision, mid-range vision and near vision.  Treatments tried include eye drops and laser.  Response to treatment was moderate improvement.  I, the attending physician,  performed the HPI with the patient and updated documentation appropriately.          Comments    Patient states vision blurred OU. Found blot clot in arm in August 2020. Onset of blurry vision slightly prior to diagnosis of blood clot in arm. Using PF once daily OD and cosopt once daily OU. On acyclovir 400 mg daily.  No floaters or flashes.       Last edited by [name], MD on 01/24/2019 12:02 PM. (History)    pt states he has a blood blot in his left arm in his fistula, he states he has sx on October 1st to put a fistula in his right arm and take out the one in his left arm, he states every since he got the blood clot, he has felt like his vision has been blurry, he states he had dialysis on a Friday, and then flew to [place], [place] for his mom's funeral and then flew back home on Monday, he states when he went to dialysis on Tuesday they discovered the clot   Referring physician: [name], MD [place],  [place] [address]  HISTORICAL INFORMATION:   Selected notes from the [facility] Referred by Dr. [name]. Hecker for concern of HRVO OD; LEE- 11.30.18 ([name]) {BCVA OD: 20/100-1 OS: 20/20-2] Ocular Hx- cataract OU PMH- HTN, high chol,  kidney disease, sleep apnea, emphysema    CURRENT MEDICATIONS: Current Outpatient Medications (Ophthalmic Drugs)  Medication Sig  . dorzolamide-timolol (COSOPT) 22.3-6.8 MG/ML ophthalmic solution Place 1 drop into both eyes 2 (two) times daily. (Patient taking differently: Place 1 drop into both eyes daily. )  . prednisoLONE acetate (PRED FORTE) 1 % ophthalmic suspension Place 1 drop into the right eye 2 (two) times daily. (Patient taking differently: Place 1 drop into the right eye daily. )  . Ganciclovir (ZIRGAN) 0.15 % GEL Place 1 drop into the right eye 5 (five) times daily. (Patient not taking: Reported on 01/08/2019)   No current facility-administered medications for this visit.  (Ophthalmic Drugs)   Current Outpatient Medications (Other)  Medication Sig  . acyclovir (ZOVIRAX) 400 MG tablet Take 400 mg by mouth daily.   [name] aspirin EC 81 MG EC tablet Take 1 tablet (81 mg total) by mouth daily.  [name] atorvastatin (LIPITOR) 40 MG tablet Take 40 mg by mouth every evening.   . nitroGLYCERIN (NITROSTAT) 0.4 MG SL tablet Place 1 tablet (0.4 mg total) under the tongue every 5 (five) minutes x 3 doses as needed for chest pain.  [name] OVER THE COUNTER MEDICATION Take 1 capsule by mouth daily. Immuno-shield otc supplement   Current Facility-Administered Medications (Other)  Medication Route  . Bevacizumab (AVASTIN) SOLN 1.25 mg Intravitreal  .  Bevacizumab (AVASTIN) SOLN 1.25 mg Intravitreal  . Bevacizumab (AVASTIN) SOLN 1.25 mg Intravitreal  . Bevacizumab (AVASTIN) SOLN 1.25 mg Intravitreal  . Bevacizumab (AVASTIN) SOLN 1.25 mg Intravitreal      REVIEW OF SYSTEMS: ROS    Positive for: Endocrine, Cardiovascular, Eyes, Respiratory   Negative for: Constitutional, Gastrointestinal, Neurological, Skin, Genitourinary, Musculoskeletal, HENT, Psychiatric, Allergic/Imm, Heme/Lymph   Last edited by Roselee Nova D, COT on 01/24/2019  8:26 AM. (History)       ALLERGIES No Known Allergies  PAST  MEDICAL HISTORY Past Medical History:  Diagnosis Date  . Cataract   . Dyspnea    on exertion  . ESRD (end stage renal disease) ([place])    Hemo- MWF, Polycystic kidney disease  . Fatigue   . History of kidney stones    removal of stone- cysto  . Hyperlipidemia   . Hyperparathyroidism, secondary renal ([place])   . Hypertension   . Hypoxemia 12/12/2013  . Nonischemic cardiomyopathy ([place])    Er 25% 2015, 55 % 2013  . OSA on CPAP    no longer using cpap  . OSA on CPAP 03/24/2014  . Pneumonia    2015ish  . Ventricular tachycardia//Freq PVCs    Past Surgical History:  Procedure Laterality Date  . A/V FISTULAGRAM Left 04/27/2017   Procedure: A/V FISTULAGRAM;  Surgeon: Conrad Lynn, MD;  Location: [facility];  Service: Cardiovascular;  Laterality: Left;  lt arm  . A/V FISTULAGRAM Left 01/10/2019   Procedure: A/V FISTULAGRAM;  Surgeon: Marty Heck, MD;  Location: [facility];  Service: Cardiovascular;  Laterality: Left;  . APPENDECTOMY    . AV FISTULA PLACEMENT  12/05/2011   Procedure: ARTERIOVENOUS (AV) FISTULA CREATION;LLEFT ARM  Surgeon: Conrad Los Prados, MD;  Location: [facility];  Service: Vascular;  Laterality: Left;  RADIO-CEPHALIC  fistula left arm  . AV FISTULA PLACEMENT  01/11/2012   Procedure: ARTERIOVENOUS (AV) FISTULA CREATION;  Surgeon: Conrad Takilma, MD;  Location: [facility];  Service: Vascular;  Laterality: Left;  Creation of left brachial cephalic arteriovenous fistula  . BASCILIC VEIN TRANSPOSITION Left 12/27/2016   Procedure: BASILIC VEIN TRANSPOSITION LEFT UPPER ARM FIRST STAGE;  Surgeon: Conrad Red Creek, MD;  Location: [facility];  Service: Vascular;  Laterality: Left;  . BASCILIC VEIN TRANSPOSITION Left 01/31/2017   Procedure: LEFT ARM BASILIC VEIN TRANSPOSITION, SECOND STAGE;  Surgeon: Conrad Playa Fortuna, MD;  Location: [facility];  Service: Vascular;  Laterality: Left;  . CARDIAC CATHETERIZATION  04-05-2010   checking for blockage but none-[facility]  . COLONOSCOPY    . CYSTOSCOPY W/  STONE MANIPULATION     "laser once" (01/22/2013)  . HEMATOMA EVACUATION Left 05/09/2017   Procedure: EVACUATION HEMATOMA LEFT ARM;  Surgeon: Conrad Bernalillo, MD;  Location: [facility];  Service: Vascular;  Laterality: Left;  . HERNIA REPAIR    . INGUINAL HERNIA REPAIR Right 11/06/2015   Procedure: OPEN HERNIA REPAIR  RIGHT INGUINAL ADULT;  Surgeon: [name], MD;  Location: [facility];  Service: General;  Laterality: Right;  with MESH  . INSERTION OF DIALYSIS CATHETER Right 10/05/2016   Procedure: INSERTION OF right internal jugular DIALYSIS CATHETER;  Surgeon: [name], MD;  Location: [facility];  Service: Vascular;  Laterality: Right;  . INSERTION OF MESH  03/20/2012   Procedure: INSERTION OF MESH;  UMB Surgeon: [name], MD;  Location: [facility];  Service: General;  Laterality: N/A;  . INSERTION OF MESH N/A 01/22/2013   Procedure: INSERTION  OF MESH;  Surgeon: [name], MD;  Location: [facility];  Service: General;  Laterality: N/A;  . LAPAROTOMY  04/02/2012   Procedure: EXPLORATORY LAPAROTOMY;  Surgeon: [name], MD;  Location: [facility];  Service: General;  Laterality: N/A;  Exploratory Laparotomy with resection of small intestine  . LEFT HEART CATHETERIZATION WITH CORONARY ANGIOGRAM N/A 05/14/2013   Procedure: LEFT HEART CATHETERIZATION WITH CORONARY ANGIOGRAM;  Surgeon: Sinclair Grooms, MD;  Location: [facility];  Service: Cardiovascular;  Laterality: N/A;  . LIGATION OF ARTERIOVENOUS  FISTULA Left 12/27/2016   Procedure: LIGATION/EXCISION OF LEFT UPPER ARM ARTERIOVENOUS  FISTULA;  EVACUATION OF HEMATOMA;  Surgeon: Conrad East Norwich, MD;  Location: [facility];  Service: Vascular;  Laterality: Left;  . REVISON OF ARTERIOVENOUS FISTULA Left 10/05/2016   Procedure: REVISON OF left arm ARTERIOVENOUS FISTULA;  Surgeon: [name], MD;  Location: [facility];  Service: Vascular;  Laterality: Left;  . TONSILLECTOMY    . UMBILICAL HERNIA REPAIR  03/20/2012   Procedure: HERNIA REPAIR UMBILICAL ADULT;  Surgeon:  [name], MD;  Location: [facility];  Service: General;  Laterality: N/A;  . UMBILICAL HERNIA REPAIR  01/22/2013   preperitoneal open procedure due to significant adhesions/notes 01/22/2013  . VENTRAL HERNIA REPAIR N/A 01/22/2013   Procedure: ATTEMPTED LAPAROSCOPIC VENTRAL HERNIA CONVERTED TO OPEN;  Surgeon: [name], MD;  [facility];  Service: General;  Laterality: N/A;    FAMILY HISTORY Family History  Problem Relation Age of Onset  . Heart disease Mother   . Hyperlipidemia Mother   . Hypertension Mother   . Kidney disease Father   . Stroke Father   . Kidney disease Brother   . Amblyopia Neg Hx   . Blindness Neg Hx   . Cataracts Neg Hx   . Diabetes Neg Hx   . Glaucoma Neg Hx   . Macular degeneration Neg Hx   . Retinal detachment Neg Hx   . Strabismus Neg Hx   . Retinitis pigmentosa Neg Hx     SOCIAL HISTORY Social History   Tobacco Use  . Smoking status: Never Smoker  . Smokeless tobacco: Never Used  Substance Use Topics  . Alcohol use: No    Alcohol/week: 0.0 standard drinks  . Drug use: No         OPHTHALMIC EXAM:  Base Eye Exam    Visual Acuity (Snellen - Linear)      Right Left   Dist cc 20/25 20/30 +1   Dist ph cc 20/25 +2 20/25 -2       Tonometry (Tonopen, 8:39 AM)      Right Left   Pressure 23 18       Pupils      Dark Light Shape React APD   Right 5 4 Round Brisk None   Left 5 4 Round Brisk None       Visual Fields (Counting fingers)      Left Right    Full Full       Extraocular Movement      Right Left    Full, Ortho Full, Ortho       Neuro/Psych    Oriented x3: Yes   Mood/Affect: Normal       Dilation    Both eyes: 1.0% Mydriacyl, 2.5% Phenylephrine @ 8:39 AM        Slit Lamp and Fundus Exam    Slit Lamp Exam      Right Left   Lids/Lashes Dermatochalasis -  upper lid Dermatochalasis - upper lid   Conjunctiva/Sclera Mild Melanosis, Nasal Pinguecula Mild Melanosis, nasal and temporal Pinguecula   Cornea  Arcus, 1+ Punctate epithelial erosions Arcus, 1+ Punctate epithelial erosions   Anterior Chamber Deep and quiet Deep and quiet   Iris Round and dilated Round and dilated   Lens 2+ Nuclear sclerosis, 2+ Cortical cataract 2+ Nuclear sclerosis, 2+ Cortical cataract   Vitreous Vitreous syneresis, no cell, +Posterior vitreous detachment Vitreous syneresis, Posterior vitreous detachment, no cells       Fundus Exam      Right Left   Disc vascular loops superiorly, trace pallor inferior hyperemia, +heme at 0400, fine NVD at 0800   C/D Ratio 0.5 0.6   Macula Flat, Blunted foveal reflex, mild RPE mottling and clumping, No heme or edema Blunted foveal reflex, inferior blot hemes   Vessels Vascular attenuation, Tortuous, mild Copper wiring Vascular attenuation, Tortuous, Copper wiring, severe AV crossing changes, +, inferior HRVO   Periphery Attached, pigmented Chorioretinal scar at 1030 Attached, pigmented lattice with 3 atrophic holes at 1030 ora - good laser surrounding, cluster of blot hemes at 0300, pigmented macroaneurysm at 0300        Refraction    Wearing Rx      Sphere Cylinder Axis Add   Right +0.50 +0.50 168 +2.75   Left +0.50 +0.50 102 +2.75   Type: Bifocal       Wearing Rx #2      Sphere Cylinder Axis Add   Right +0.50   +2.25   Left +0.25   +2.25   Type: Bifocal       Manifest Refraction      Sphere Cylinder Axis Dist VA   Right +1.00 +0.50 160 20/25   Left +0.50 +0.50 075 20/25          IMAGING AND PROCEDURES  Imaging and Procedures for 08/31/17  OCT, Retina - OU - Both Eyes       Right Eye Quality was good. Central Foveal Thickness: 245. Progression has been stable. Findings include normal foveal contour, no SRF, no IRF, vitreomacular adhesion , inner retinal atrophy (Inner retinal atrophy IT quad caught on widefield).   Left Eye Quality was good. Central Foveal Thickness: 259. Progression has worsened. Findings include normal foveal contour, no IRF, no SRF,  epiretinal membrane, vitreomacular adhesion , outer retinal atrophy, intraretinal hyper-reflective material (Focal central ORA, irregular lamination and mild IRHM and edema IN macula).   Notes *Images captured and stored on drive  Diagnosis / Impression:  OD: NFP, No IRF/SRF; Inner retinal atrophy IT quad caught on widefield OS: trace ERM; Focal patches of ORA, irregular lamination and mild IRHM and edema IN macula  Clinical management:  See below  Abbreviations: NFP - Normal foveal profile. CME - cystoid macular edema. PED - pigment epithelial detachment. IRF - intraretinal fluid. SRF - subretinal fluid. EZ - ellipsoid zone. ERM - epiretinal membrane. ORA - outer retinal atrophy. ORT - outer retinal tubulation. SRHM - subretinal hyper-reflective material         Intravitreal Injection, Pharmacologic Agent - OS - Left Eye       Time Out 01/24/2019. 10:53 AM. Confirmed correct patient, procedure, site, and patient consented.   Anesthesia Topical anesthesia was used. Anesthetic medications included Lidocaine 2%, Proparacaine 0.5%.   Procedure Preparation included 5% betadine to ocular surface, eyelid speculum. A 30 gauge needle was used.   Injection:  1.25 mg Bevacizumab (AVASTIN) SOLN   NDC: 92426-834-19, Lot: [id number] ,  Expiration date: 04/18/2019   Route: Intravitreal, Site: Left Eye, Waste: 0 mL  Post-op Post injection exam found visual acuity of at least counting fingers. The patient tolerated the procedure well. There were no complications. The patient received written and verbal post procedure care education.                 ASSESSMENT/PLAN:    ICD-10-CM   1. Branch retinal vein occlusion of left eye with macular edema  H34.8320 Intravitreal Injection, Pharmacologic Agent - OS - Left Eye    Bevacizumab (AVASTIN) SOLN 1.25 mg  2. Retinal edema  H35.81 OCT, Retina - OU - Both Eyes  3. Herpes keratitis  B00.52   4. Anterior uveitis  H20.9   5. Branch  retinal vein occlusion of right eye with macular edema  H34.8310   6. Lattice degeneration of left retina  H35.412   7. Atrophic retinal break, multiple, left eye  H33.332   8. Posterior vitreous detachment of both eyes  H43.813   9. Combined forms of age-related cataract of both eyes  H25.813   10. Hypertensive retinopathy of both eyes  H35.033 CANCELED: Fluorescein Angiography Optos (Transit OS)   1,2. New Inferior HRVO OS  - The natural history of retinal vein occlusion and macular edema and treatment options including observation, laser photocoagulation, and intravitreal antiVEGF injection with Avastin and Lucentis and Eylea and intravitreal injection of steroids with triamcinolone and Ozurdex and the complications of these procedures including loss of vision, infection, cataract, glaucoma, and retinal detachment were discussed with patient.  - Specifically discussed findings from [place] / [place] study regarding patient stabilization with anti-VEGF agents and increased potential for visual improvements.  Also discussed need for frequent follow up and potentially multiple injections given the chronic nature of the disease process  - recently found to have a clot in fistula, left arm  - exam shows clusters of DBH inferior hemispheric distribution and telangectatic vessels  - BCVA 20/25  - OCT shows scattered ORA, irregular lamination and mild IRHM and edema IN macula   - recommend IVA OS #1 today, 09.24.20  - RBA of procedure discussed, questions answered  - informed consent obtained and signed  - see procedure note  - F/U 2-3 weeks -- DFE/OCT/possible injection  3,4. 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  - likely etiology of prior episodes of Anterior Uveitis OD  - FA on 3.18.19 without posterior involvement, vasculitis or leakage OD  - had recurrent AC cell and stellate KP on 3.11.2020 - restarted PF QID OD  - today KP and injection stably resolved, only rare pigment  remains  - IOP 23 OD, 18 OS today   - cont Pred Forte to QD OD  - given history of repeat recurrences of anterior uveitis despite maintenance acyclovir, may need to be on a maintenance dose of topical steroid  - cont Cosopt BID OU  - epithelial dendrite remains resolved today (on po acyclovir; off topical trifluridine)  - under the expert management of Dr. Kathlen Mody  - continue po acyclovir, 400 mg QD per Dr. Kathlen Mody  5. History of BRVO w/ CME OD  - s/p IVA OD #1 (12.21.18), #2 (01.18.19), #3 (02.14.19), #4 (04.02.19), #5 (05.02.19)  - OCT today with stable improvement in IRF despite no antiVEGF therapy since May 2019 -- resolved  - will continue to hold on injection OD  6,7. Lattice degeneration with atrophic holes OS-   - peripheral holes superiorly at 1030 without SRF  or RD  - S/P laser retinopexy OS (01.04.19)  - good laser in place -- stable  8. PVD / vitreous syneresis OU  - Discussed findings and prognosis  - No RT or RD on 360 scleral depressed exam  - Reviewed s/s of RT/RD  - Strict return precautions for any such RT/RD signs/symptoms  9. Combined forms age-related cataract OU-   - The symptoms of cataract, surgical options, and treatments and risks were discussed with patient.  - discussed diagnosis and progression  - not yet visually significant  - monitor for now   Ophthalmic Meds Ordered this visit:  Meds ordered this encounter  Medications  . Bevacizumab (AVASTIN) SOLN 1.25 mg       Return for f/u 2-3 weeks, BRVO OS, DFE, OCT.  There are no Patient Instructions on file for this visit.   Explained the diagnoses, plan, and follow up with the patient and they expressed understanding.  Patient expressed understanding of the importance of proper follow up care.   This document serves as a record of services personally performed by Gardiner Sleeper, MD, PhD. It was created on their behalf by Roselee Nova, COMT. The creation of this record is the provider's dictation  and/or activities during the visit.  Electronically signed by: Roselee Nova, COMT 01/24/19 12:31 PM   Gardiner Sleeper, M.D., Ph.D. Diseases & Surgery of the Retina and [place] 01/24/19   I have reviewed the above documentation for accuracy and completeness, and I agree with the above. Gardiner Sleeper, M.D., Ph.D. 01/24/19 12:31 PM    Abbreviations: M myopia (nearsighted); A astigmatism; H hyperopia (farsighted); P presbyopia; Mrx spectacle prescription;  CTL contact lenses; OD right eye; OS left eye; OU both eyes  XT exotropia; ET esotropia; PEK punctate epithelial keratitis; PEE punctate epithelial erosions; DES dry eye syndrome; MGD meibomian gland dysfunction; ATs artificial tears; PFAT's preservative free artificial tears; [place] nuclear sclerotic cataract; PSC posterior subcapsular cataract; ERM epi-retinal membrane; PVD posterior vitreous detachment; RD retinal detachment; DM diabetes mellitus; DR diabetic retinopathy; NPDR non-proliferative diabetic retinopathy; PDR proliferative diabetic retinopathy; CSME clinically significant macular edema; DME diabetic macular edema; dbh dot blot hemorrhages; CWS cotton wool spot; POAG primary open angle glaucoma; C/D cup-to-disc ratio; HVF humphrey visual field; GVF goldmann visual field; OCT optical coherence tomography; IOP intraocular pressure; BRVO Branch retinal vein occlusion; CRVO central retinal vein occlusion; CRAO central retinal artery occlusion; BRAO branch retinal artery occlusion; RT retinal tear; SB scleral buckle; PPV pars plana vitrectomy; VH Vitreous hemorrhage; PRP panretinal laser photocoagulation; IVK intravitreal kenalog; VMT vitreomacular traction; MH Macular hole;  NVD neovascularization of the disc; NVE neovascularization elsewhere; AREDS age related eye disease study; ARMD age related macular degeneration; POAG primary open angle glaucoma; EBMD epithelial/anterior basement membrane dystrophy;  ACIOL anterior chamber intraocular lens; IOL intraocular lens; PCIOL posterior chamber intraocular lens; Phaco/IOL phacoemulsification with intraocular lens placement; [place] photorefractive keratectomy; LASIK laser assisted in situ keratomileusis; HTN hypertension; DM diabetes mellitus; COPD chronic obstructive pulmonary disease

## 2019-01-23 DIAGNOSIS — N2581 Secondary hyperparathyroidism of renal origin: Secondary | ICD-10-CM | POA: Diagnosis not present

## 2019-01-23 DIAGNOSIS — N186 End stage renal disease: Secondary | ICD-10-CM | POA: Diagnosis not present

## 2019-01-23 DIAGNOSIS — Z23 Encounter for immunization: Secondary | ICD-10-CM | POA: Diagnosis not present

## 2019-01-23 DIAGNOSIS — Z992 Dependence on renal dialysis: Secondary | ICD-10-CM | POA: Diagnosis not present

## 2019-01-24 ENCOUNTER — Other Ambulatory Visit: Payer: Self-pay

## 2019-01-24 ENCOUNTER — Ambulatory Visit (INDEPENDENT_AMBULATORY_CARE_PROVIDER_SITE_OTHER): Payer: Medicare Other | Admitting: Ophthalmology

## 2019-01-24 ENCOUNTER — Encounter (INDEPENDENT_AMBULATORY_CARE_PROVIDER_SITE_OTHER): Payer: Self-pay | Admitting: Ophthalmology

## 2019-01-24 DIAGNOSIS — H209 Unspecified iridocyclitis: Secondary | ICD-10-CM

## 2019-01-24 DIAGNOSIS — B0052 Herpesviral keratitis: Secondary | ICD-10-CM

## 2019-01-24 DIAGNOSIS — H33332 Multiple defects of retina without detachment, left eye: Secondary | ICD-10-CM | POA: Diagnosis not present

## 2019-01-24 DIAGNOSIS — H34832 Tributary (branch) retinal vein occlusion, left eye, with macular edema: Secondary | ICD-10-CM

## 2019-01-24 DIAGNOSIS — H43813 Vitreous degeneration, bilateral: Secondary | ICD-10-CM

## 2019-01-24 DIAGNOSIS — H3581 Retinal edema: Secondary | ICD-10-CM

## 2019-01-24 DIAGNOSIS — H35412 Lattice degeneration of retina, left eye: Secondary | ICD-10-CM | POA: Diagnosis not present

## 2019-01-24 DIAGNOSIS — H34831 Tributary (branch) retinal vein occlusion, right eye, with macular edema: Secondary | ICD-10-CM | POA: Diagnosis not present

## 2019-01-24 DIAGNOSIS — H25813 Combined forms of age-related cataract, bilateral: Secondary | ICD-10-CM

## 2019-01-24 DIAGNOSIS — H35033 Hypertensive retinopathy, bilateral: Secondary | ICD-10-CM

## 2019-01-24 MED ORDER — BEVACIZUMAB CHEMO INJECTION 1.25MG/0.05ML SYRINGE FOR KALEIDOSCOPE
1.2500 mg | INTRAVITREAL | Status: AC | PRN
  Administered 2019-01-24: 1.25 mg via INTRAVITREAL

## 2019-01-24 NOTE — Progress Notes (Signed)
[facility] Note  01/24/2019     CHIEF COMPLAINT Patient presents for Retina Evaluation   HISTORY OF PRESENT ILLNESS: Michael [name]. is a 63 y.o. male who presents to the clinic today for:   HPI    Retina Evaluation    In both eyes.  This started 6 months ago.  Duration of 6 months.  Associated Symptoms Negative for Flashes, Blind Spot, Photophobia, Scalp Tenderness, Fever, Floaters, Pain, Glare, Jaw Claudication, Weight Loss, Distortion, Redness, Trauma, Shoulder/Hip pain and Fatigue.  Context:  distance vision, mid-range vision and near vision.  Treatments tried include eye drops and laser.  Response to treatment was moderate improvement.  I, the attending physician,  performed the HPI with the patient and updated documentation appropriately.          Comments    Patient states vision blurred OU. Found blot clot in arm in August 2020. Onset of blurry vision slightly prior to diagnosis of blood clot in arm. Using PF once daily OD and cosopt once daily OU. On acyclovir 400 mg daily.  No floaters or flashes.       Last edited by [name], MD on 01/24/2019 12:02 PM. (History)    pt states he has a blood blot in his left arm in his fistula, he states he has sx on October 1st to put a fistula in his right arm and take out the one in his left arm, he states every since he got the blood clot, he has felt like his vision has been blurry, he states he had dialysis on a Friday, and then flew to [place], [place] for his mom's funeral and then flew back home on Monday, he states when he went to dialysis on Tuesday they discovered the clot   Referring physician: Hortencia Pilar, MD [place],  [place] [address]  HISTORICAL INFORMATION:   Selected notes from the [facility] Referred by Dr. [name]. Hecker for concern of HRVO OD; LEE- 11.30.18 ([name]) {BCVA OD: 20/100-1 OS: 20/20-2] Ocular Hx- cataract OU PMH- HTN, high chol,  kidney disease, sleep apnea, emphysema    CURRENT MEDICATIONS: Current Outpatient Medications (Ophthalmic Drugs)  Medication Sig  . dorzolamide-timolol (COSOPT) 22.3-6.8 MG/ML ophthalmic solution Place 1 drop into both eyes 2 (two) times daily. (Patient taking differently: Place 1 drop into both eyes daily. )  . prednisoLONE acetate (PRED FORTE) 1 % ophthalmic suspension Place 1 drop into the right eye 2 (two) times daily. (Patient taking differently: Place 1 drop into the right eye daily. )  . Ganciclovir (ZIRGAN) 0.15 % GEL Place 1 drop into the right eye 5 (five) times daily. (Patient not taking: Reported on 01/08/2019)   No current facility-administered medications for this visit.  (Ophthalmic Drugs)   Current Outpatient Medications (Other)  Medication Sig  . acyclovir (ZOVIRAX) 400 MG tablet Take 400 mg by mouth daily.   [name] aspirin EC 81 MG EC tablet Take 1 tablet (81 mg total) by mouth daily.  [name] atorvastatin (LIPITOR) 40 MG tablet Take 40 mg by mouth every evening.   . nitroGLYCERIN (NITROSTAT) 0.4 MG SL tablet Place 1 tablet (0.4 mg total) under the tongue every 5 (five) minutes x 3 doses as needed for chest pain.  [name] OVER THE COUNTER MEDICATION Take 1 capsule by mouth daily. Immuno-shield otc supplement   Current Facility-Administered Medications (Other)  Medication Route  . Bevacizumab (AVASTIN) SOLN 1.25 mg Intravitreal  .  Bevacizumab (AVASTIN) SOLN 1.25 mg Intravitreal  . Bevacizumab (AVASTIN) SOLN 1.25 mg Intravitreal  . Bevacizumab (AVASTIN) SOLN 1.25 mg Intravitreal  . Bevacizumab (AVASTIN) SOLN 1.25 mg Intravitreal      REVIEW OF SYSTEMS: ROS    Positive for: Endocrine, Cardiovascular, Eyes, Respiratory   Negative for: Constitutional, Gastrointestinal, Neurological, Skin, Genitourinary, Musculoskeletal, HENT, Psychiatric, Allergic/Imm, Heme/Lymph   Last edited by Roselee Nova D, COT on 01/24/2019  8:26 AM. (History)       ALLERGIES No Known Allergies  PAST  MEDICAL HISTORY Past Medical History:  Diagnosis Date  . Cataract   . Dyspnea    on exertion  . ESRD (end stage renal disease) ([place])    Hemo- MWF, Polycystic kidney disease  . Fatigue   . History of kidney stones    removal of stone- cysto  . Hyperlipidemia   . Hyperparathyroidism, secondary renal ([place])   . Hypertension   . Hypoxemia 12/12/2013  . Nonischemic cardiomyopathy ([place])    Er 25% 2015, 55 % 2013  . OSA on CPAP    no longer using cpap  . OSA on CPAP 03/24/2014  . Pneumonia    2015ish  . Ventricular tachycardia//Freq PVCs    Past Surgical History:  Procedure Laterality Date  . A/V FISTULAGRAM Left 04/27/2017   Procedure: A/V FISTULAGRAM;  Surgeon: Conrad Klamath, MD;  Location: [facility];  Service: Cardiovascular;  Laterality: Left;  lt arm  . A/V FISTULAGRAM Left 01/10/2019   Procedure: A/V FISTULAGRAM;  Surgeon: Marty Heck, MD;  Location: [facility];  Service: Cardiovascular;  Laterality: Left;  . APPENDECTOMY    . AV FISTULA PLACEMENT  12/05/2011   Procedure: ARTERIOVENOUS (AV) FISTULA CREATION;LLEFT ARM  Surgeon: Conrad Samoa, MD;  Location: [facility];  Service: Vascular;  Laterality: Left;  RADIO-CEPHALIC  fistula left arm  . AV FISTULA PLACEMENT  01/11/2012   Procedure: ARTERIOVENOUS (AV) FISTULA CREATION;  Surgeon: Conrad Baiting Hollow, MD;  Location: [facility];  Service: Vascular;  Laterality: Left;  Creation of left brachial cephalic arteriovenous fistula  . BASCILIC VEIN TRANSPOSITION Left 12/27/2016   Procedure: BASILIC VEIN TRANSPOSITION LEFT UPPER ARM FIRST STAGE;  Surgeon: Conrad Hadar, MD;  Location: [facility];  Service: Vascular;  Laterality: Left;  . BASCILIC VEIN TRANSPOSITION Left 01/31/2017   Procedure: LEFT ARM BASILIC VEIN TRANSPOSITION, SECOND STAGE;  Surgeon: Conrad Imbery, MD;  Location: [facility];  Service: Vascular;  Laterality: Left;  . CARDIAC CATHETERIZATION  04-05-2010   checking for blockage but none-[facility]  . COLONOSCOPY    . CYSTOSCOPY W/  STONE MANIPULATION     "laser once" (01/22/2013)  . HEMATOMA EVACUATION Left 05/09/2017   Procedure: EVACUATION HEMATOMA LEFT ARM;  Surgeon: Conrad Berwyn, MD;  Location: [facility];  Service: Vascular;  Laterality: Left;  . HERNIA REPAIR    . INGUINAL HERNIA REPAIR Right 11/06/2015   Procedure: OPEN HERNIA REPAIR  RIGHT INGUINAL ADULT;  Surgeon: [name], MD;  Location: [facility];  Service: General;  Laterality: Right;  with MESH  . INSERTION OF DIALYSIS CATHETER Right 10/05/2016   Procedure: INSERTION OF right internal jugular DIALYSIS CATHETER;  Surgeon: [name], MD;  Location: [facility];  Service: Vascular;  Laterality: Right;  . INSERTION OF MESH  03/20/2012   Procedure: INSERTION OF MESH;  UMB Surgeon: [name], MD;  Location: [facility];  Service: General;  Laterality: N/A;  . INSERTION OF MESH N/A 01/22/2013   Procedure: INSERTION  OF MESH;  Surgeon: [name], MD;  Location: [facility];  Service: General;  Laterality: N/A;  . LAPAROTOMY  04/02/2012   Procedure: EXPLORATORY LAPAROTOMY;  Surgeon: [name], MD;  Location: [facility];  Service: General;  Laterality: N/A;  Exploratory Laparotomy with resection of small intestine  . LEFT HEART CATHETERIZATION WITH CORONARY ANGIOGRAM N/A 05/14/2013   Procedure: LEFT HEART CATHETERIZATION WITH CORONARY ANGIOGRAM;  Surgeon: Sinclair Grooms, MD;  Location: [facility];  Service: Cardiovascular;  Laterality: N/A;  . LIGATION OF ARTERIOVENOUS  FISTULA Left 12/27/2016   Procedure: LIGATION/EXCISION OF LEFT UPPER ARM ARTERIOVENOUS  FISTULA;  EVACUATION OF HEMATOMA;  Surgeon: Conrad Marion, MD;  Location: [facility];  Service: Vascular;  Laterality: Left;  . REVISON OF ARTERIOVENOUS FISTULA Left 10/05/2016   Procedure: REVISON OF left arm ARTERIOVENOUS FISTULA;  Surgeon: [name], MD;  Location: [facility];  Service: Vascular;  Laterality: Left;  . TONSILLECTOMY    . UMBILICAL HERNIA REPAIR  03/20/2012   Procedure: HERNIA REPAIR UMBILICAL ADULT;  Surgeon:  [name], MD;  Location: [facility];  Service: General;  Laterality: N/A;  . UMBILICAL HERNIA REPAIR  01/22/2013   preperitoneal open procedure due to significant adhesions/notes 01/22/2013  . VENTRAL HERNIA REPAIR N/A 01/22/2013   Procedure: ATTEMPTED LAPAROSCOPIC VENTRAL HERNIA CONVERTED TO OPEN;  Surgeon: [name], MD;  Location: [facility];  Service: General;  Laterality: N/A;    FAMILY HISTORY Family History  Problem Relation Age of Onset  . Heart disease Mother   . Hyperlipidemia Mother   . Hypertension Mother   . Kidney disease Father   . Stroke Father   . Kidney disease Brother   . Amblyopia Neg Hx   . Blindness Neg Hx   . Cataracts Neg Hx   . Diabetes Neg Hx   . Glaucoma Neg Hx   . Macular degeneration Neg Hx   . Retinal detachment Neg Hx   . Strabismus Neg Hx   . Retinitis pigmentosa Neg Hx     SOCIAL HISTORY Social History   Tobacco Use  . Smoking status: Never Smoker  . Smokeless tobacco: Never Used  Substance Use Topics  . Alcohol use: No    Alcohol/week: 0.0 standard drinks  . Drug use: No         OPHTHALMIC EXAM:  Base Eye Exam    Visual Acuity (Snellen - Linear)      Right Left   Dist cc 20/25 20/30 +1   Dist ph cc 20/25 +2 20/25 -2       Tonometry (Tonopen, 8:39 AM)      Right Left   Pressure 23 18       Pupils      Dark Light Shape React APD   Right 5 4 Round Brisk None   Left 5 4 Round Brisk None       Visual Fields (Counting fingers)      Left Right    Full Full       Extraocular Movement      Right Left    Full, Ortho Full, Ortho       Neuro/Psych    Oriented x3: Yes   Mood/Affect: Normal       Dilation    Both eyes: 1.0% Mydriacyl, 2.5% Phenylephrine @ 8:39 AM        Slit Lamp and Fundus Exam    Slit Lamp Exam      Right Left   Lids/Lashes Dermatochalasis -  upper lid Dermatochalasis - upper lid   Conjunctiva/Sclera Mild Melanosis, Nasal Pinguecula Mild Melanosis, nasal and temporal Pinguecula   Cornea  Arcus, 1+ Punctate epithelial erosions Arcus, 1+ Punctate epithelial erosions   Anterior Chamber Deep and quiet Deep and quiet   Iris Round and dilated Round and dilated   Lens 2+ Nuclear sclerosis, 2+ Cortical cataract 2+ Nuclear sclerosis, 2+ Cortical cataract   Vitreous Vitreous syneresis, no cell, +Posterior vitreous detachment Vitreous syneresis, Posterior vitreous detachment, no cells       Fundus Exam      Right Left   Disc vascular loops superiorly, trace pallor inferior hyperemia, +heme at 0400, fine NVD at 0800   C/D Ratio 0.5 0.6   Macula Flat, Blunted foveal reflex, mild RPE mottling and clumping, No heme or edema Blunted foveal reflex, inferior blot hemes   Vessels Vascular attenuation, Tortuous, mild Copper wiring Vascular attenuation, Tortuous, Copper wiring, severe AV crossing changes, +, inferior HRVO   Periphery Attached, pigmented Chorioretinal scar at 1030 Attached, pigmented lattice with 3 atrophic holes at 1030 ora - good laser surrounding, cluster of blot hemes at 0300, pigmented macroaneurysm at 0300        Refraction    Wearing Rx      Sphere Cylinder Axis Add   Right +0.50 +0.50 168 +2.75   Left +0.50 +0.50 102 +2.75   Type: Bifocal       Wearing Rx #2      Sphere Cylinder Axis Add   Right +0.50   +2.25   Left +0.25   +2.25   Type: Bifocal       Manifest Refraction      Sphere Cylinder Axis Dist VA   Right +1.00 +0.50 160 20/25   Left +0.50 +0.50 075 20/25          IMAGING AND PROCEDURES  Imaging and Procedures for 08/31/17  OCT, Retina - OU - Both Eyes       Right Eye Quality was good. Central Foveal Thickness: 245. Progression has been stable. Findings include normal foveal contour, no SRF, no IRF, vitreomacular adhesion , inner retinal atrophy (Inner retinal atrophy IT quad caught on widefield).   Left Eye Quality was good. Central Foveal Thickness: 259. Progression has worsened. Findings include normal foveal contour, no IRF, no SRF,  epiretinal membrane, vitreomacular adhesion , outer retinal atrophy, intraretinal hyper-reflective material (Focal central ORA, irregular lamination and mild IRHM and edema IN macula).   Notes *Images captured and stored on drive  Diagnosis / Impression:  OD: NFP, No IRF/SRF; Inner retinal atrophy IT quad caught on widefield OS: trace ERM; Focal patches of ORA, irregular lamination and mild IRHM and edema IN macula  Clinical management:  See below  Abbreviations: NFP - Normal foveal profile. CME - cystoid macular edema. PED - pigment epithelial detachment. IRF - intraretinal fluid. SRF - subretinal fluid. EZ - ellipsoid zone. ERM - epiretinal membrane. ORA - outer retinal atrophy. ORT - outer retinal tubulation. SRHM - subretinal hyper-reflective material         Intravitreal Injection, Pharmacologic Agent - OS - Left Eye       Time Out 01/24/2019. 10:53 AM. Confirmed correct patient, procedure, site, and patient consented.   Anesthesia Topical anesthesia was used. Anesthetic medications included Lidocaine 2%, Proparacaine 0.5%.   Procedure Preparation included 5% betadine to ocular surface, eyelid speculum. A 30 gauge needle was used.   Injection:  1.25 mg Bevacizumab (AVASTIN) SOLN   NDC: 02409-735-32, Lot: [id number] ,  Expiration date: 04/18/2019   Route: Intravitreal, Site: Left Eye, Waste: 0 mL  Post-op Post injection exam found visual acuity of at least counting fingers. The patient tolerated the procedure well. There were no complications. The patient received written and verbal post procedure care education.                 ASSESSMENT/PLAN:    ICD-10-CM   1. Branch retinal vein occlusion of left eye with macular edema  H34.8320 Intravitreal Injection, Pharmacologic Agent - OS - Left Eye    Bevacizumab (AVASTIN) SOLN 1.25 mg  2. Retinal edema  H35.81 OCT, Retina - OU - Both Eyes  3. Herpes keratitis  B00.52   4. Anterior uveitis  H20.9   5. Branch  retinal vein occlusion of right eye with macular edema  H34.8310   6. Lattice degeneration of left retina  H35.412   7. Atrophic retinal break, multiple, left eye  H33.332   8. Posterior vitreous detachment of both eyes  H43.813   9. Combined forms of age-related cataract of both eyes  H25.813   10. Hypertensive retinopathy of both eyes  H35.033 CANCELED: Fluorescein Angiography Optos (Transit OS)   1,2. New Inferior HRVO OS  - The natural history of retinal vein occlusion and macular edema and treatment options including observation, laser photocoagulation, and intravitreal antiVEGF injection with Avastin and Lucentis and Eylea and intravitreal injection of steroids with triamcinolone and Ozurdex and the complications of these procedures including loss of vision, infection, cataract, glaucoma, and retinal detachment were discussed with patient.  - Specifically discussed findings from [place] / [place] study regarding patient stabilization with anti-VEGF agents and increased potential for visual improvements.  Also discussed need for frequent follow up and potentially multiple injections given the chronic nature of the disease process  - recently found to have a clot in fistula, left arm  - exam shows clusters of DBH inferior hemispheric distribution and telangectatic vessels  - BCVA 20/25  - OCT shows scattered ORA, irregular lamination and mild IRHM and edema IN macula   - recommend IVA OS #1 today, 09.24.20  - RBA of procedure discussed, questions answered  - informed consent obtained and signed  - see procedure note  - F/U 2-3 weeks -- DFE/OCT/possible injection  3,4. 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  - likely etiology of prior episodes of Anterior Uveitis OD  - FA on 3.18.19 without posterior involvement, vasculitis or leakage OD  - had recurrent AC cell and stellate KP on 3.11.2020 - restarted PF QID OD  - today KP and injection stably resolved, only rare pigment  remains  - IOP 23 OD, 18 OS today   - cont Pred Forte to QD OD  - given history of repeat recurrences of anterior uveitis despite maintenance acyclovir, may need to be on a maintenance dose of topical steroid  - cont Cosopt BID OU  - epithelial dendrite remains resolved today (on po acyclovir; off topical trifluridine)  - under the expert management of Dr. Kathlen Mody  - continue po acyclovir, 400 mg QD per Dr. Kathlen Mody  5. History of BRVO w/ CME OD  - s/p IVA OD #1 (12.21.18), #2 (01.18.19), #3 (02.14.19), #4 (04.02.19), #5 (05.02.19)  - OCT today with stable improvement in IRF despite no antiVEGF therapy since May 2019 -- resolved  - will continue to hold on injection OD  6,7. Lattice degeneration with atrophic holes OS-   - peripheral holes superiorly at 1030 without SRF  or RD  - S/P laser retinopexy OS (01.04.19)  - good laser in place -- stable  8. PVD / vitreous syneresis OU  - Discussed findings and prognosis  - No RT or RD on 360 scleral depressed exam  - Reviewed s/s of RT/RD  - Strict return precautions for any such RT/RD signs/symptoms  9. Combined forms age-related cataract OU-   - The symptoms of cataract, surgical options, and treatments and risks were discussed with patient.  - discussed diagnosis and progression  - not yet visually significant  - monitor for now   Ophthalmic Meds Ordered this visit:  Meds ordered this encounter  Medications  . Bevacizumab (AVASTIN) SOLN 1.25 mg       Return for f/u 2-3 weeks, BRVO OS, DFE, OCT.  There are no Patient Instructions on file for this visit.   Explained the diagnoses, plan, and follow up with the patient and they expressed understanding.  Patient expressed understanding of the importance of proper follow up care.   This document serves as a record of services personally performed by Gardiner Sleeper, MD, PhD. It was created on their behalf by Roselee Nova, COMT. The creation of this record is the provider's dictation  and/or activities during the visit.  Electronically signed by: Roselee Nova, COMT 01/24/19 12:32 PM   Gardiner Sleeper, M.D., Ph.D. Diseases & Surgery of the Retina and [place] 01/24/19   I have reviewed the above documentation for accuracy and completeness, and I agree with the above. Gardiner Sleeper, M.D., Ph.D. 01/24/19 12:32 PM    Abbreviations: M myopia (nearsighted); A astigmatism; H hyperopia (farsighted); P presbyopia; Mrx spectacle prescription;  CTL contact lenses; OD right eye; OS left eye; OU both eyes  XT exotropia; ET esotropia; PEK punctate epithelial keratitis; PEE punctate epithelial erosions; DES dry eye syndrome; MGD meibomian gland dysfunction; ATs artificial tears; PFAT's preservative free artificial tears; [place] nuclear sclerotic cataract; PSC posterior subcapsular cataract; ERM epi-retinal membrane; PVD posterior vitreous detachment; RD retinal detachment; DM diabetes mellitus; DR diabetic retinopathy; NPDR non-proliferative diabetic retinopathy; PDR proliferative diabetic retinopathy; CSME clinically significant macular edema; DME diabetic macular edema; dbh dot blot hemorrhages; CWS cotton wool spot; POAG primary open angle glaucoma; C/D cup-to-disc ratio; HVF humphrey visual field; GVF goldmann visual field; OCT optical coherence tomography; IOP intraocular pressure; BRVO Branch retinal vein occlusion; CRVO central retinal vein occlusion; CRAO central retinal artery occlusion; BRAO branch retinal artery occlusion; RT retinal tear; SB scleral buckle; PPV pars plana vitrectomy; VH Vitreous hemorrhage; PRP panretinal laser photocoagulation; IVK intravitreal kenalog; VMT vitreomacular traction; MH Macular hole;  NVD neovascularization of the disc; NVE neovascularization elsewhere; AREDS age related eye disease study; ARMD age related macular degeneration; POAG primary open angle glaucoma; EBMD epithelial/anterior basement membrane dystrophy;  ACIOL anterior chamber intraocular lens; IOL intraocular lens; PCIOL posterior chamber intraocular lens; Phaco/IOL phacoemulsification with intraocular lens placement; Section photorefractive keratectomy; LASIK laser assisted in situ keratomileusis; HTN hypertension; DM diabetes mellitus; COPD chronic obstructive pulmonary disease

## 2019-01-26 DIAGNOSIS — Z23 Encounter for immunization: Secondary | ICD-10-CM | POA: Diagnosis not present

## 2019-01-26 DIAGNOSIS — N2581 Secondary hyperparathyroidism of renal origin: Secondary | ICD-10-CM | POA: Diagnosis not present

## 2019-01-26 DIAGNOSIS — Z992 Dependence on renal dialysis: Secondary | ICD-10-CM | POA: Diagnosis not present

## 2019-01-26 DIAGNOSIS — N186 End stage renal disease: Secondary | ICD-10-CM | POA: Diagnosis not present

## 2019-01-28 DIAGNOSIS — Z23 Encounter for immunization: Secondary | ICD-10-CM | POA: Diagnosis not present

## 2019-01-28 DIAGNOSIS — N2581 Secondary hyperparathyroidism of renal origin: Secondary | ICD-10-CM | POA: Diagnosis not present

## 2019-01-28 DIAGNOSIS — N186 End stage renal disease: Secondary | ICD-10-CM | POA: Diagnosis not present

## 2019-01-28 DIAGNOSIS — Z992 Dependence on renal dialysis: Secondary | ICD-10-CM | POA: Diagnosis not present

## 2019-01-29 ENCOUNTER — Other Ambulatory Visit (HOSPITAL_COMMUNITY)
Admission: RE | Admit: 2019-01-29 | Discharge: 2019-01-29 | Disposition: A | Discharge status: Home / Self Care | Payer: Medicare Other | Source: Ambulatory Visit | Attending: Vascular Surgery | Admitting: Vascular Surgery

## 2019-01-29 DIAGNOSIS — Z01812 Encounter for preprocedural laboratory examination: Secondary | ICD-10-CM | POA: Insufficient documentation

## 2019-01-29 DIAGNOSIS — Z20828 Contact with and (suspected) exposure to other viral communicable diseases: Secondary | ICD-10-CM | POA: Insufficient documentation

## 2019-01-29 LAB — SARS CORONAVIRUS 2 (TAT 6-24 HRS): SARS Coronavirus 2: NEGATIVE

## 2019-01-30 ENCOUNTER — Encounter (HOSPITAL_COMMUNITY): Payer: Self-pay | Admitting: Vascular Surgery

## 2019-01-30 DIAGNOSIS — N2581 Secondary hyperparathyroidism of renal origin: Secondary | ICD-10-CM | POA: Diagnosis not present

## 2019-01-30 DIAGNOSIS — N186 End stage renal disease: Secondary | ICD-10-CM | POA: Diagnosis not present

## 2019-01-30 DIAGNOSIS — Z992 Dependence on renal dialysis: Secondary | ICD-10-CM | POA: Diagnosis not present

## 2019-01-30 DIAGNOSIS — Z23 Encounter for immunization: Secondary | ICD-10-CM | POA: Diagnosis not present

## 2019-01-30 NOTE — Progress Notes (Signed)
Anesthesia Chart Review: SAME DAY WORK-UP   Case: [id number] Date/Time: 01/31/19 5809   Procedures:      LIGATION OF ARTERIOVENOUS  FISTULA (Left )     ARTERIOVENOUS (AV) FISTULA CREATION VERSUS GRAFT PLACEMENT (Right )   Anesthesia type: Choice   Pre-op diagnosis: END STAGE RENAL DISEASE   Location: [facility] OR ROOM 10 / [place] [facility]   Surgeon: Marty Heck, MD      DISCUSSION: Patient is a 63 year old male scheduled for the above procedure. Based on 01/08/19 office note by Dr. 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 He had fistulogram on 01/10/19 that showed "no outflow in the left upper arm and appears occluded." Decision make to evaluate his right arm for a new AVF placement and ligate the stump of his left AVF given aneurysmal segment with overlying ulceration.  History includes never smoker, non-ischemic cardiomyopathy (2015), VT/frequent PVCs (05/2013), HTN, hyperlipidemia, ESRD (on hemodialysis,MWF), polycystic kidney disease, OSA, exertional dyspnea. He is s/p multiple hemodialysis access procedures, last few include 2nd stage left basilic vein transposition 01/31/17, evacuation of LUE hematoma 05/09/17, and AV fistulagram (not done with anesthesia) on 01/10/19.   01/10/19 EKG prior to fistulagram showed atypical aflutter.  I do not see any input regarding EKG findings. This was a new findings when compared to 10/05/16 tracing. No reported history of atrial arrhythmias..   - He used to see EP cardiologist Jannet Askew, MD  Last office visit 10/24/13; Patient turned down treatment for cardiomyopathy/ICD; Follow-up in 1 month for nonischemic cardiomyopathy recommended but did not happen.   Reviewed known information with anesthesiologist Suzette Battiest, MD. By fistulogram report AVF  appears occluded. He also has [facility] for HD access. Would recommend cardiology re-evaluation for new atrial flutter. If risk of delaying surgery felt to great then Dr. Carlis Abbott can contact anesthesia APP or anesthesiologist. I called and notified [facility] RN Zigmund Daniel.     PROVIDERS: [name], MD is listed as PCP   LABS: For day of surgery. 01/29/19 COVID-19 negative.   EKG: 01/10/19:  Atypical atrial flutter with premature ventricular or aberrantly conducted complexes Left axis deviation Non-specific intra-ventricular conduction block Inferior infarct , age undetermined Abnormal ECG Since last tracing Atrial flutter NOW PRESENT Confirmed by Skeet Latch [phone]) on 01/11/2019 3:35:42 PM   CV: Echo 07/23/13:  - Left ventricle: The cavity size was mildly dilated. Wall thickness was normal. Systolic function was severely reduced. The estimated ejection fraction was in the range of 25% to 30%. There is akinesis of the posterior myocardium. There is severe hypokinesis of the lateral myocardium. There is hypokinesis of the anterior myocardium. Doppler parameters are consistent with abnormal left ventricular relaxation (grade 1 diastolic dysfunction). - Aortic valve: Mild regurgitation. - Aortic root: The aortic root was mildly dilated. - Left atrium: The atrium was mildly dilated.  Cardiac cath 05/14/13:  1. Nonischemic cardiomyopathywith reduced EF less than 25% and severely elevated left ventricular end-diastolic pressures 2. Widely patent coronary arteries   Past Medical History:  Diagnosis Date  . Cataract   . Dyspnea    on exertion  . ESRD (end stage renal disease) ([place])    Hemo- MWF, Polycystic kidney disease  . Fatigue   . History of kidney stones    removal of stone- cysto  .  Hyperlipidemia   . Hyperparathyroidism, secondary renal ([place])   . Hypertension   . Hypoxemia 12/12/2013  . Nonischemic cardiomyopathy ([place])    Er 25% 2015, 55 % 2013  . OSA on CPAP    no longer using cpap   . OSA on CPAP 03/24/2014  . Pneumonia    2015ish  . Ventricular tachycardia//Freq PVCs     Past Surgical History:  Procedure Laterality Date  . A/V FISTULAGRAM Left 04/27/2017   Procedure: A/V FISTULAGRAM;  Surgeon: Conrad Roger Mills, MD;  Location: [place] [facility];  Service: Cardiovascular;  Laterality: Left;  lt arm  . A/V FISTULAGRAM Left 01/10/2019   Procedure: A/V FISTULAGRAM;  Surgeon: Marty Heck, MD;  Location: [place] [facility];  Service: Cardiovascular;  Laterality: Left;  . APPENDECTOMY    . AV FISTULA PLACEMENT  12/05/2011   Procedure: ARTERIOVENOUS (AV) FISTULA CREATION;LLEFT ARM  Surgeon: Conrad Comfort, MD;  Location: [place];  Service: Vascular;  Laterality: Left;  RADIO-CEPHALIC  fistula left arm  . AV FISTULA PLACEMENT  01/11/2012   Procedure: ARTERIOVENOUS (AV) FISTULA CREATION;  Surgeon: Conrad Raven, MD;  Location: [place];  Service: Vascular;  Laterality: Left;  Creation of left brachial cephalic arteriovenous fistula  . BASCILIC VEIN TRANSPOSITION Left 12/27/2016   Procedure: BASILIC VEIN TRANSPOSITION LEFT UPPER ARM FIRST STAGE;  Surgeon: Conrad Delavan, MD;  Location: [place];  Service: Vascular;  Laterality: Left;  . BASCILIC VEIN TRANSPOSITION Left 01/31/2017   Procedure: LEFT ARM BASILIC VEIN TRANSPOSITION, SECOND STAGE;  Surgeon: Conrad Exeter, MD;  Location: [place];  Service: Vascular;  Laterality: Left;  . CARDIAC CATHETERIZATION  04-05-2010   checking for blockage but none-[facility]  . COLONOSCOPY    . CYSTOSCOPY W/ STONE MANIPULATION     "laser once" (01/22/2013)  . HEMATOMA EVACUATION Left 05/09/2017   Procedure: EVACUATION HEMATOMA LEFT ARM;  Surgeon: Conrad Waukau, MD;  Location: [place];  Service: Vascular;  Laterality: Left;  . HERNIA REPAIR    . INGUINAL HERNIA REPAIR Right 11/06/2015   Procedure: OPEN HERNIA REPAIR  RIGHT INGUINAL ADULT;  Surgeon: [name], MD;  Location: [facility];  Service: General;  Laterality: Right;  with MESH  . INSERTION OF DIALYSIS  CATHETER Right 10/05/2016   Procedure: INSERTION OF right internal jugular DIALYSIS CATHETER;  Surgeon: [name], MD;  Location: [place];  Service: Vascular;  Laterality: Right;  . INSERTION OF MESH  03/20/2012   Procedure: INSERTION OF MESH;  UMB Surgeon: [name], MD;  Location: [place];  Service: General;  Laterality: N/A;  . INSERTION OF MESH N/A 01/22/2013   Procedure: INSERTION OF MESH;  Surgeon: [name], MD;  Location: [place];  Service: General;  Laterality: N/A;  . LAPAROTOMY  04/02/2012   Procedure: EXPLORATORY LAPAROTOMY;  Surgeon: [name], MD;  Location: [place];  Service: General;  Laterality: N/A;  Exploratory Laparotomy with resection of small intestine  . LEFT HEART CATHETERIZATION WITH CORONARY ANGIOGRAM N/A 05/14/2013   Procedure: LEFT HEART CATHETERIZATION WITH CORONARY ANGIOGRAM;  Surgeon: Sinclair Grooms, MD;  Location: [facility] CATH LAB;  Service: Cardiovascular;  Laterality: N/A;  . LIGATION OF ARTERIOVENOUS  FISTULA Left 12/27/2016   Procedure: LIGATION/EXCISION OF LEFT UPPER ARM ARTERIOVENOUS  FISTULA;  EVACUATION OF HEMATOMA;  Surgeon: Conrad Williamsport, MD;  Location: [place];  Service: Vascular;  Laterality: Left;  . REVISON OF ARTERIOVENOUS FISTULA Left 10/05/2016   Procedure: REVISON OF left arm ARTERIOVENOUS  FISTULA;  Surgeon: [name], MD;  Location: [place];  Service: Vascular;  Laterality: Left;  . TONSILLECTOMY    . UMBILICAL HERNIA REPAIR  03/20/2012   Procedure: HERNIA REPAIR UMBILICAL ADULT;  Surgeon: [name], MD;  Location: [place];  Service: General;  Laterality: N/A;  . UMBILICAL HERNIA REPAIR  01/22/2013   preperitoneal open procedure due to significant adhesions/notes 01/22/2013  . VENTRAL HERNIA REPAIR N/A 01/22/2013   Procedure: ATTEMPTED LAPAROSCOPIC VENTRAL HERNIA CONVERTED TO OPEN;  Surgeon: [name], MD;  Location: [place];  Service: General;  Laterality: N/A;    MEDICATIONS: No current facility-administered medications  for this encounter.    [name] acyclovir (ZOVIRAX) 400 MG tablet  . aspirin EC 81 MG EC tablet  . atorvastatin (LIPITOR) 40 MG tablet  . dorzolamide-timolol (COSOPT) 22.3-6.8 MG/ML ophthalmic solution  . nitroGLYCERIN (NITROSTAT) 0.4 MG SL tablet  . OVER THE COUNTER MEDICATION  . prednisoLONE acetate (PRED FORTE) 1 % ophthalmic suspension  . Ganciclovir (ZIRGAN) 0.15 % GEL    Myra Gianotti, PA-C Surgical Short Stay/Anesthesiology [facility] Phone [phone] [facility] Phone [phone] 01/30/2019 2:33 PM

## 2019-01-31 ENCOUNTER — Encounter (HOSPITAL_COMMUNITY): Admission: RE | Payer: Self-pay | Source: Ambulatory Visit

## 2019-01-31 ENCOUNTER — Ambulatory Visit (HOSPITAL_COMMUNITY): Admission: RE | Admit: 2019-01-31 | Payer: Medicare Other | Source: Ambulatory Visit | Admitting: Vascular Surgery

## 2019-01-31 DIAGNOSIS — Q612 Polycystic kidney, adult type: Secondary | ICD-10-CM | POA: Diagnosis not present

## 2019-01-31 DIAGNOSIS — Z992 Dependence on renal dialysis: Secondary | ICD-10-CM | POA: Diagnosis not present

## 2019-01-31 DIAGNOSIS — N186 End stage renal disease: Secondary | ICD-10-CM | POA: Diagnosis not present

## 2019-01-31 SURGERY — LIGATION OF ARTERIOVENOUS  FISTULA
Anesthesia: Choice | Laterality: Right

## 2019-02-02 DIAGNOSIS — N186 End stage renal disease: Secondary | ICD-10-CM | POA: Diagnosis not present

## 2019-02-02 DIAGNOSIS — N2581 Secondary hyperparathyroidism of renal origin: Secondary | ICD-10-CM | POA: Diagnosis not present

## 2019-02-02 DIAGNOSIS — Z992 Dependence on renal dialysis: Secondary | ICD-10-CM | POA: Diagnosis not present

## 2019-02-04 DIAGNOSIS — Z992 Dependence on renal dialysis: Secondary | ICD-10-CM | POA: Diagnosis not present

## 2019-02-04 DIAGNOSIS — N186 End stage renal disease: Secondary | ICD-10-CM | POA: Diagnosis not present

## 2019-02-04 DIAGNOSIS — N2581 Secondary hyperparathyroidism of renal origin: Secondary | ICD-10-CM | POA: Diagnosis not present

## 2019-02-05 NOTE — Progress Notes (Signed)
[facility] Note  02/14/2019     CHIEF COMPLAINT Patient presents for Retina Follow Up   HISTORY OF PRESENT ILLNESS: Michael Cloke. is a 63 y.o. male who presents to the clinic today for:   HPI    Retina Follow Up    Patient presents with  CRVO/BRVO.  In left eye.  This started weeks ago.  Severity is moderate.  Duration of weeks.  Since onset it is stable.  I, the attending physician,  performed the HPI with the patient and updated documentation appropriately.          Comments    Patient states his vision is about the same OU.  Patient denies eye pain or discomfort and denies any new or worsening floaters or fol OU.       Last edited by [name], MD on 02/14/2019  9:40 AM. (History)    pt states he cannot tell whether the injection helped his vision or not, pt states he did not have the fistula sx bc he had a "flutter" in his heart, pt states he saw a cardiologist and was cleared to have the sx, pt takes a Bayer aspirin daily, but was not put on a blood thinner by the cardiologist  Referring physician: Monna Fam, MD [place],  [place] [id number]  HISTORICAL INFORMATION:   Selected notes from the Echo Referred by Dr. [name]. Hecker for concern of HRVO OD; LEE- 11.30.18 ([name]) {BCVA OD: 20/100-1 OS: 20/20-2] Ocular Hx- cataract OU PMH- HTN, high chol, kidney disease, sleep apnea, emphysema    CURRENT MEDICATIONS: Current Outpatient Medications (Ophthalmic Drugs)  Medication Sig  . dorzolamide-timolol (COSOPT) 22.3-6.8 MG/ML ophthalmic solution INSTILL 1 DROP INTO EACH EYE TWICE DAILY  . Ganciclovir (ZIRGAN) 0.15 % GEL Place 1 drop into the right eye 5 (five) times daily.   No current facility-administered medications for this visit.  (Ophthalmic Drugs)   Current Outpatient Medications (Other)  Medication Sig  . acyclovir (ZOVIRAX) 400 MG tablet Take 400 mg by mouth daily.   [name] aspirin EC 81  MG EC tablet Take 1 tablet (81 mg total) by mouth daily.  [name] atorvastatin (LIPITOR) 40 MG tablet Take 40 mg by mouth every evening.   . metoprolol succinate (TOPROL XL) 25 MG 24 hr tablet Take 0.5 tablets (12.5 mg total) by mouth daily.  . nitroGLYCERIN (NITROSTAT) 0.4 MG SL tablet Place 1 tablet (0.4 mg total) under the tongue every 5 (five) minutes x 3 doses as needed for chest pain.  [name] OVER THE COUNTER MEDICATION Take 1 capsule by mouth daily. Immuno-shield otc supplement   No current facility-administered medications for this visit.  (Other)      REVIEW OF SYSTEMS: ROS    Positive for: Endocrine, Cardiovascular, Eyes, Respiratory   Negative for: Constitutional, Gastrointestinal, Neurological, Skin, Genitourinary, Musculoskeletal, HENT, Psychiatric, Allergic/Imm, Heme/Lymph   Last edited by Doneen Poisson on 02/14/2019  8:49 AM. (History)       ALLERGIES No Known Allergies  PAST MEDICAL HISTORY Past Medical History:  Diagnosis Date  . Cataract   . Dyspnea    on exertion  . ESRD (end stage renal disease) ([place])    Hemo- MWF, Polycystic kidney disease  . Fatigue   . History of kidney stones    removal of stone- cysto  . Hyperlipidemia   . Hyperparathyroidism, secondary renal ([place])   . Hypertension   . Hypoxemia 12/12/2013  .  Nonischemic cardiomyopathy ([place])    Er 25% 2015, 55 % 2013  . OSA on CPAP    no longer using cpap  . OSA on CPAP 03/24/2014  . Pneumonia    2015ish  . Ventricular tachycardia//Freq PVCs    Past Surgical History:  Procedure Laterality Date  . A/V FISTULAGRAM Left 04/27/2017   Procedure: A/V FISTULAGRAM;  Surgeon: Conrad Granger, MD;  Location: [place];  Service: Cardiovascular;  Laterality: Left;  lt arm  . A/V FISTULAGRAM Left 01/10/2019   Procedure: A/V FISTULAGRAM;  Surgeon: Marty Heck, MD;  Location: [place] [facility];  Service: Cardiovascular;  Laterality: Left;  . APPENDECTOMY    . AV FISTULA PLACEMENT  12/05/2011    Procedure: ARTERIOVENOUS (AV) FISTULA CREATION;LLEFT ARM  Surgeon: Conrad Greencastle, MD;  Location: [place];  Service: Vascular;  Laterality: Left;  RADIO-CEPHALIC  fistula left arm  . AV FISTULA PLACEMENT  01/11/2012   Procedure: ARTERIOVENOUS (AV) FISTULA CREATION;  Surgeon: Conrad Casnovia, MD;  Location: [place];  Service: Vascular;  Laterality: Left;  Creation of left brachial cephalic arteriovenous fistula  . BASCILIC VEIN TRANSPOSITION Left 12/27/2016   Procedure: BASILIC VEIN TRANSPOSITION LEFT UPPER ARM FIRST STAGE;  Surgeon: Conrad Belville, MD;  Location: [place];  Service: Vascular;  Laterality: Left;  . BASCILIC VEIN TRANSPOSITION Left 01/31/2017   Procedure: LEFT ARM BASILIC VEIN TRANSPOSITION, SECOND STAGE;  Surgeon: Conrad Mission, MD;  Location: [place];  Service: Vascular;  Laterality: Left;  . CARDIAC CATHETERIZATION  04-05-2010   checking for blockage but none-[facility]  . COLONOSCOPY    . CYSTOSCOPY W/ STONE MANIPULATION     "laser once" (01/22/2013)  . HEMATOMA EVACUATION Left 05/09/2017   Procedure: EVACUATION HEMATOMA LEFT ARM;  Surgeon: Conrad Freeburg, MD;  Location: [place];  Service: Vascular;  Laterality: Left;  . HERNIA REPAIR    . INGUINAL HERNIA REPAIR Right 11/06/2015   Procedure: OPEN HERNIA REPAIR  RIGHT INGUINAL ADULT;  Surgeon: [name], MD;  Location: [facility];  Service: General;  Laterality: Right;  with MESH  . INSERTION OF DIALYSIS CATHETER Right 10/05/2016   Procedure: INSERTION OF right internal jugular DIALYSIS CATHETER;  Surgeon: [name], MD;  Location: [place];  Service: Vascular;  Laterality: Right;  . INSERTION OF MESH  03/20/2012   Procedure: INSERTION OF MESH;  UMB Surgeon: [name], MD;  Location: [place];  Service: General;  Laterality: N/A;  . INSERTION OF MESH N/A 01/22/2013   Procedure: INSERTION OF MESH;  Surgeon: [name], MD;  Location: [place];  Service: General;  Laterality: N/A;  . LAPAROTOMY  04/02/2012   Procedure: EXPLORATORY LAPAROTOMY;   Surgeon: [name], MD;  Location: [place];  Service: General;  Laterality: N/A;  Exploratory Laparotomy with resection of small intestine  . LEFT HEART CATHETERIZATION WITH CORONARY ANGIOGRAM N/A 05/14/2013   Procedure: LEFT HEART CATHETERIZATION WITH CORONARY ANGIOGRAM;  Surgeon: Sinclair Grooms, MD;  Location: [facility];  Service: Cardiovascular;  Laterality: N/A;  . LIGATION OF ARTERIOVENOUS  FISTULA Left 12/27/2016   Procedure: LIGATION/EXCISION OF LEFT UPPER ARM ARTERIOVENOUS  FISTULA;  EVACUATION OF HEMATOMA;  Surgeon: Conrad [employer], MD;  Location: [place];  Service: Vascular;  Laterality: Left;  . REVISON OF ARTERIOVENOUS FISTULA Left 10/05/2016   Procedure: REVISON OF left arm ARTERIOVENOUS FISTULA;  Surgeon: [name], MD;  Location: [place];  Service: Vascular;  Laterality: Left;  . TONSILLECTOMY    .  UMBILICAL HERNIA REPAIR  03/20/2012   Procedure: HERNIA REPAIR UMBILICAL ADULT;  Surgeon: [name], MD;  Location: [place];  Service: General;  Laterality: N/A;  . UMBILICAL HERNIA REPAIR  01/22/2013   preperitoneal open procedure due to significant adhesions/notes 01/22/2013  . VENTRAL HERNIA REPAIR N/A 01/22/2013   Procedure: ATTEMPTED LAPAROSCOPIC VENTRAL HERNIA CONVERTED TO OPEN;  Surgeon: [name], MD;  Location[facility];  Service: General;  Laterality: N/A;    FAMILY HISTORY Family History  Problem Relation Age of Onset  . Heart disease Mother   . Hyperlipidemia Mother   . Hypertension Mother   . Kidney disease Father   . Stroke Father   . Kidney disease Brother   . Amblyopia Neg Hx   . Blindness Neg Hx   . Cataracts Neg Hx   . Diabetes Neg Hx   . Glaucoma Neg Hx   . Macular degeneration Neg Hx   . Retinal detachment Neg Hx   . Strabismus Neg Hx   . Retinitis pigmentosa Neg Hx     SOCIAL HISTORY Social History   Tobacco Use  . Smoking status: Never Smoker  . Smokeless tobacco: Never Used  Substance Use Topics  . Alcohol use: No     Alcohol/week: 0.0 standard drinks  . Drug use: No         OPHTHALMIC EXAM:  Base Eye Exam    Visual Acuity (Snellen - Linear)      Right Left   Dist cc 20/30 -1 20/30 -2   Dist ph cc 20/25 -2 NI   Correction: Glasses       Tonometry (Tonopen, 8:52 AM)      Right Left   Pressure 22 18       Pupils      Dark Light Shape React APD   Right 6 5 Round Minimal 0   Left 6 5 Round Minimal 0       Visual Fields      Left Right    Full Full       Extraocular Movement      Right Left    Full Full       Neuro/Psych    Oriented x3: Yes   Mood/Affect: Normal       Dilation    Both eyes: 1.0% Mydriacyl, 2.5% Phenylephrine @ 8:52 AM        Slit Lamp and Fundus Exam    Slit Lamp Exam      Right Left   Lids/Lashes Dermatochalasis - upper lid Dermatochalasis - upper lid   Conjunctiva/Sclera Mild Melanosis, Nasal Pinguecula Mild Melanosis, nasal and temporal Pinguecula   Cornea Arcus, 1+ Punctate epithelial erosions Arcus, 1+ Punctate epithelial erosions   Anterior Chamber Deep and quiet Deep and quiet   Iris Round and dilated Round and dilated   Lens 2+ Nuclear sclerosis, 2+ Cortical cataract 2+ Nuclear sclerosis, 2+ Cortical cataract   Vitreous Vitreous syneresis, no cell, +Posterior vitreous detachment Vitreous syneresis, Posterior vitreous detachment, +cell       Fundus Exam      Right Left   Disc Pink and Sharp inferior hyperemia, +heme at 0400, fine NVD at 0800   C/D Ratio 0.5 0.6   Macula Flat, Blunted foveal reflex, mild RPE mottling and clumping, No heme or edema Blunted foveal reflex, focal blot hemes nasal macula extending from disc to fovea - slightly improved from prior   Vessels Vascular attenuation, Tortuous, mild Copper wiring Vascular attenuation, Tortuous, Copper wiring,  severe AV crossing changes, +, inferior HRVO   Periphery Attached, pigmented Chorioretinal scar at 1030 Attached, pigmented lattice with 3 atrophic holes at 1030 ora - good laser  surrounding, cluster of blot hemes greatest temporally and inferiorly greatest at 0300, pigmented macroaneurysm at 0300        Refraction    Wearing Rx      Sphere Cylinder Axis Add   Right +0.50 +0.50 168 +2.75   Left +0.50 +0.50 102 +2.75   Type: Bifocal       Wearing Rx #2      Sphere Cylinder Axis Add   Right +0.50   +2.25   Left +0.25   +2.25   Type: Bifocal          IMAGING AND PROCEDURES  Imaging and Procedures for 08/31/17  OCT, Retina - OU - Both Eyes       Right Eye Quality was good. Central Foveal Thickness: 245. Progression has been stable. Findings include normal foveal contour, no SRF, no IRF, vitreomacular adhesion , inner retinal atrophy (Inner retinal atrophy IT quad caught on widefield).   Left Eye Quality was good. Central Foveal Thickness: 250. Progression has improved. Findings include normal foveal contour, no IRF, no SRF, epiretinal membrane, vitreomacular adhesion , outer retinal atrophy, intraretinal hyper-reflective material (Focal central ORA, irregular lamination and mild IRHM and edema IN macula -- slightly improved).   Notes *Images captured and stored on drive  Diagnosis / Impression:  OD: NFP, No IRF/SRF; Inner retinal atrophy IT quad caught on widefield - stable OS: trace ERM; Focal patches of ORA, irregular lamination and mild IRHM and edema IN macula - slightly improved  Clinical management:  See below  Abbreviations: NFP - Normal foveal profile. CME - cystoid macular edema. PED - pigment epithelial detachment. IRF - intraretinal fluid. SRF - subretinal fluid. EZ - ellipsoid zone. ERM - epiretinal membrane. ORA - outer retinal atrophy. ORT - outer retinal tubulation. SRHM - subretinal hyper-reflective material                  ASSESSMENT/PLAN:    ICD-10-CM   1. Branch retinal vein occlusion of left eye with macular edema  Y63.7858 CANCELED: Intravitreal Injection, Pharmacologic Agent - OS - Left Eye  2. Retinal edema   H35.81 OCT, Retina - OU - Both Eyes  3. Herpes keratitis  B00.52   4. Anterior uveitis  H20.9   5. Branch retinal vein occlusion of right eye with macular edema  H34.8310   6. Lattice degeneration of left retina  H35.412   7. Atrophic retinal break, multiple, left eye  H33.332   8. Posterior vitreous detachment of both eyes  H43.813   9. Combined forms of age-related cataract of both eyes  H25.813    1,2. New Inferior HRVO OS  - recently found to have a clot in fistula, left arm  - exam shows clusters of DBH inferior hemispheric distribution and telangectatic vessels  - BCVA 20/30  - OCT shows scattered ORA, irregular lamination and mild IRHM and edema IN macula -- slightly improved  - s/p IVA OS #1 (09.24.20)  - F/U 1 week -- DFE/OCT/possible injection  3,4. 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  - likely etiology of prior episodes of Anterior Uveitis OD  - FA on 3.18.19 without posterior involvement, vasculitis or leakage OD  - had recurrent AC cell and stellate KP on 3.11.2020 - restarted PF QID OD  - today KP and injection stably  resolved, only rare pigment remains  - IOP 22 OD, 18 OS today   - cont Pred Forte to QD OD  - given history of repeat recurrences of anterior uveitis despite maintenance acyclovir, may need to be on a maintenance dose of topical steroid  - cont Cosopt BID OU  - epithelial dendrite remains resolved today (on po acyclovir; off topical trifluridine)  - under the expert management of Dr. Kathlen Mody  - continue po acyclovir, 400 mg QD per Dr. Kathlen Mody  5. History of BRVO w/ CME OD  - s/p IVA OD #1 (12.21.18), #2 (01.18.19), #3 (02.14.19), #4 (04.02.19), #5 (05.02.19)  - OCT today with stable improvement in IRF despite no antiVEGF therapy since May 2019 -- resolved  - will continue to hold on injection OD  6,7. Lattice degeneration with atrophic holes OS-   - peripheral holes superiorly at 1030 without SRF or RD  - S/P laser retinopexy OS  (01.04.19)  - good laser in place -- stable  8. PVD / vitreous syneresis OU  - Discussed findings and prognosis  - No RT or RD on 360 scleral depressed exam  - Reviewed s/s of RT/RD  - Strict return precautions for any such RT/RD signs/symptoms  9. Combined forms age-related cataract OU-   - The symptoms of cataract, surgical options, and treatments and risks were discussed with patient.  - discussed diagnosis and progression  - not yet visually significant  - monitor for now   Ophthalmic Meds Ordered this visit:  No orders of the defined types were placed in this encounter.      Return in about 1 week (around 02/21/2019) for inferior HRVO OS, DFE, OCT.  There are no Patient Instructions on file for this visit.   Explained the diagnoses, plan, and follow up with the patient and they expressed understanding.  Patient expressed understanding of the importance of proper follow up care.   Electronically signed by: Leeann Must, COA 10.06.20 10:58AM  This document serves as a record of services personally performed by Gardiner Sleeper, MD, PhD. It was created on their behalf by Ernest Mallick, OA, an ophthalmic assistant. The creation of this record is the provider's dictation and/or activities during the visit.    Electronically signed by: Ernest Mallick, OA 10.15.2020 11:37 PM   Gardiner Sleeper, M.D., Ph.D. Diseases & Surgery of the Retina and Vitreous [facility]  I have reviewed the above documentation for accuracy and completeness, and I agree with the above. Gardiner Sleeper, M.D., Ph.D. 02/14/19 11:37 PM    Abbreviations: M myopia (nearsighted); A astigmatism; H hyperopia (farsighted); P presbyopia; Mrx spectacle prescription;  CTL contact lenses; OD right eye; OS left eye; OU both eyes  XT exotropia; ET esotropia; PEK punctate epithelial keratitis; PEE punctate epithelial erosions; DES dry eye syndrome; MGD meibomian gland dysfunction; ATs artificial  tears; PFAT's preservative free artificial tears; [place] nuclear sclerotic cataract; PSC posterior subcapsular cataract; ERM epi-retinal membrane; PVD posterior vitreous detachment; RD retinal detachment; DM diabetes mellitus; DR diabetic retinopathy; NPDR non-proliferative diabetic retinopathy; PDR proliferative diabetic retinopathy; CSME clinically significant macular edema; DME diabetic macular edema; dbh dot blot hemorrhages; CWS cotton wool spot; POAG primary open angle glaucoma; C/D cup-to-disc ratio; HVF humphrey visual field; GVF goldmann visual field; OCT optical coherence tomography; IOP intraocular pressure; BRVO Branch retinal vein occlusion; CRVO central retinal vein occlusion; CRAO central retinal artery occlusion; BRAO branch retinal artery occlusion; RT retinal tear; SB scleral buckle; PPV pars  plana vitrectomy; VH Vitreous hemorrhage; PRP panretinal laser photocoagulation; IVK intravitreal kenalog; VMT vitreomacular traction; MH Macular hole;  NVD neovascularization of the disc; NVE neovascularization elsewhere; AREDS age related eye disease study; ARMD age related macular degeneration; POAG primary open angle glaucoma; EBMD epithelial/anterior basement membrane dystrophy; ACIOL anterior chamber intraocular lens; IOL intraocular lens; PCIOL posterior chamber intraocular lens; Phaco/IOL phacoemulsification with intraocular lens placement; [place] photorefractive keratectomy; LASIK laser assisted in situ keratomileusis; HTN hypertension; DM diabetes mellitus; COPD chronic obstructive pulmonary disease

## 2019-02-06 ENCOUNTER — Other Ambulatory Visit: Payer: Self-pay

## 2019-02-06 ENCOUNTER — Ambulatory Visit (INDEPENDENT_AMBULATORY_CARE_PROVIDER_SITE_OTHER): Payer: Medicare Other | Admitting: Internal Medicine

## 2019-02-06 ENCOUNTER — Encounter: Payer: Self-pay | Admitting: Internal Medicine

## 2019-02-06 VITALS — BP 98/68 | HR 97 | Ht 67.0 in | Wt 174.0 lb

## 2019-02-06 DIAGNOSIS — N186 End stage renal disease: Secondary | ICD-10-CM | POA: Diagnosis not present

## 2019-02-06 DIAGNOSIS — Z992 Dependence on renal dialysis: Secondary | ICD-10-CM | POA: Diagnosis not present

## 2019-02-06 DIAGNOSIS — I471 Supraventricular tachycardia: Secondary | ICD-10-CM | POA: Diagnosis not present

## 2019-02-06 DIAGNOSIS — I428 Other cardiomyopathies: Secondary | ICD-10-CM

## 2019-02-06 DIAGNOSIS — I4719 Other supraventricular tachycardia: Secondary | ICD-10-CM | POA: Insufficient documentation

## 2019-02-06 DIAGNOSIS — N2581 Secondary hyperparathyroidism of renal origin: Secondary | ICD-10-CM | POA: Diagnosis not present

## 2019-02-06 MED ORDER — METOPROLOL SUCCINATE ER 25 MG PO TB24: 12.5000 mg | ORAL_TABLET | Freq: Every day | ORAL | 3 refills | Status: DC

## 2019-02-06 NOTE — Patient Instructions (Addendum)
Medication Instructions:  Your physician has recommended you make the following change in your medication:   1.  Start taking Toprol XL 25 mg--Take 1/2 tablet by mouth daily  Labwork: None ordered.  Testing/Procedures: None ordered.  Follow-Up: Your physician wants you to follow-up in: 6 weeks with Dr. Lovena Le.       Any Other Special Instructions Will Be Listed Below (If Applicable).  If you need a refill on your cardiac medications before your next appointment, please call your pharmacy.   Metoprolol extended-release tablets What is this medicine? METOPROLOL (me TOE proe lole) is a beta-blocker. Beta-blockers reduce the workload on the heart and help it to beat more regularly. This medicine is used to treat high blood pressure and to prevent chest pain. It is also used to after a heart attack and to prevent an additional heart attack from occurring. This medicine may be used for other purposes; ask your health care provider or pharmacist if you have questions. COMMON BRAND NAME(S): toprol, Toprol XL What should I tell my health care provider before I take this medicine? They need to know if you have any of these conditions:  diabetes  heart or vessel disease like slow heart rate, worsening heart failure, heart block, sick sinus syndrome or Raynaud's disease  kidney disease  liver disease  lung or breathing disease, like asthma or emphysema  pheochromocytoma  thyroid disease  an unusual or allergic reaction to metoprolol, other beta-blockers, medicines, foods, dyes, or preservatives  pregnant or trying to get pregnant  breast-feeding How should I use this medicine? Take this medicine by mouth with a glass of water. Follow the directions on the prescription label. Do not crush or chew. Take this medicine with or immediately after meals. Take your doses at regular intervals. Do not take more medicine than directed. Do not stop taking this medicine suddenly. This could  lead to serious heart-related effects. Talk to your pediatrician regarding the use of this medicine in children. While this drug may be prescribed for children as young as 6 years for selected conditions, precautions do apply. Overdosage: If you think you have taken too much of this medicine contact a poison control center or emergency room at once. NOTE: This medicine is only for you. Do not share this medicine with others. What if I miss a dose? If you miss a dose, take it as soon as you can. If it is almost time for your next dose, take only that dose. Do not take double or extra doses. What may interact with this medicine? This medicine may interact with the following medications:  certain medicines for blood pressure, heart disease, irregular heart beat  certain medicines for depression, like monoamine oxidase (MAO) inhibitors, fluoxetine, or paroxetine  clonidine  dobutamine  epinephrine  isoproterenol  reserpine This list may not describe all possible interactions. Give your health care provider a list of all the medicines, herbs, non-prescription drugs, or dietary supplements you use. Also tell them if you smoke, drink alcohol, or use illegal drugs. Some items may interact with your medicine. What should I watch for while using this medicine? Visit your doctor or health care professional for regular check ups. Contact your doctor right away if your symptoms worsen. Check your blood pressure and pulse rate regularly. Ask your health care professional what your blood pressure and pulse rate should be, and when you should contact them. You may get drowsy or dizzy. Do not drive, use machinery, or do anything that needs  mental alertness until you know how this medicine affects you. Do not sit or stand up quickly, especially if you are an older patient. This reduces the risk of dizzy or fainting spells. Contact your doctor if these symptoms continue. Alcohol may interfere with the effect  of this medicine. Avoid alcoholic drinks. This medicine may increase blood sugar. Ask your healthcare provider if changes in diet or medicines are needed if you have diabetes. What side effects may I notice from receiving this medicine? Side effects that you should report to your doctor or health care professional as soon as possible:  allergic reactions like skin rash, itching or hives  cold or numb hands or feet  depression  difficulty breathing  faint  fever with sore throat  irregular heartbeat, chest pain  rapid weight gain   signs and symptoms of high blood sugar such as being more thirsty or hungry or having to urinate more than normal. You may also feel very tired or have blurry vision.  swollen legs or ankles Side effects that usually do not require medical attention (report to your doctor or health care professional if they continue or are bothersome):  anxiety or nervousness  change in sex drive or performance  dry skin  headache  nightmares or trouble sleeping  short term memory loss  stomach upset or diarrhea This list may not describe all possible side effects. Call your doctor for medical advice about side effects. You may report side effects to FDA at 1-800-FDA-1088. Where should I keep my medicine? Keep out of the reach of children. Store at room temperature between 15 and 30 degrees C (59 and 86 degrees F). Throw away any unused medicine after the expiration date. NOTE: This sheet is a summary. It may not cover all possible information. If you have questions about this medicine, talk to your doctor, pharmacist, or health care provider.  2020 Elsevier/Gold Standard (2018-02-06 11:09:41)

## 2019-02-06 NOTE — Progress Notes (Signed)
HPI Michael [name] is referred today by Dr. Ola Spurr for evaluation of atrial flutter/tachycardia. He is a pleasant 63 yo man with polycystic kidney disease who has undergone HD for over 7 years. The patient was set to undergo a new fistula placement and preoperative screening ECG demonstrated atrial tachycardia vs flutter and he is referred for additional evaluation. He is asymptomatic. He denies palpitations/chest pain or sob. He has not had syncope. He has chronic class 2 dyspnea. He has no edema. He denies problems during HD.  No Known Allergies   Current Outpatient Medications  Medication Sig Dispense Refill  . acyclovir (ZOVIRAX) 400 MG tablet Take 400 mg by mouth daily.     [name] aspirin EC 81 MG EC tablet Take 1 tablet (81 mg total) by mouth daily.    [name] atorvastatin (LIPITOR) 40 MG tablet Take 40 mg by mouth every evening.     . dorzolamide-timolol (COSOPT) 22.3-6.8 MG/ML ophthalmic solution Place 1 drop into both eyes 2 (two) times daily. (Patient taking differently: Place 1 drop into both eyes daily. ) 10 mL 3  . Ganciclovir (ZIRGAN) 0.15 % GEL Place 1 drop into the right eye 5 (five) times daily. 5 g 1  . nitroGLYCERIN (NITROSTAT) 0.4 MG SL tablet Place 1 tablet (0.4 mg total) under the tongue every 5 (five) minutes x 3 doses as needed for chest pain. 30 tablet 12  . OVER THE COUNTER MEDICATION Take 1 capsule by mouth daily. Immuno-shield otc supplement     No current facility-administered medications for this visit.      Past Medical History:  Diagnosis Date  . Cataract   . Dyspnea    on exertion  . ESRD (end stage renal disease) ([place])    Hemo- MWF, Polycystic kidney disease  . Fatigue   . History of kidney stones    removal of stone- cysto  . Hyperlipidemia   . Hyperparathyroidism, secondary renal ([place])   . Hypertension   . Hypoxemia 12/12/2013  . Nonischemic cardiomyopathy ([place])    Er 25% 2015, 55 % 2013  . OSA on CPAP    no longer using cpap  . OSA on CPAP  03/24/2014  . Pneumonia    2015ish  . Ventricular tachycardia//Freq PVCs     ROS:   All systems reviewed and negative except as noted in the HPI.   Past Surgical History:  Procedure Laterality Date  . A/V FISTULAGRAM Left 04/27/2017   Procedure: A/V FISTULAGRAM;  Surgeon: Conrad Essex, MD;  Location: [facility] [facility];  Service: Cardiovascular;  Laterality: Left;  lt arm  . A/V FISTULAGRAM Left 01/10/2019   Procedure: A/V FISTULAGRAM;  Surgeon: Marty Heck, MD;  Location: [place];  Service: Cardiovascular;  Laterality: Left;  . APPENDECTOMY    . AV FISTULA PLACEMENT  12/05/2011   Procedure: ARTERIOVENOUS (AV) FISTULA CREATION;LLEFT ARM  Surgeon: Conrad [place], MD;  Location: [facility];  Service: Vascular;  Laterality: Left;  RADIO-CEPHALIC  fistula left arm  . AV FISTULA PLACEMENT  01/11/2012   Procedure: ARTERIOVENOUS (AV) FISTULA CREATION;  Surgeon: Conrad [place], MD;  Location: [facility];  Service: Vascular;  Laterality: Left;  Creation of left brachial cephalic arteriovenous fistula  . BASCILIC VEIN TRANSPOSITION Left 12/27/2016   Procedure: BASILIC VEIN TRANSPOSITION LEFT UPPER ARM FIRST STAGE;  Surgeon: Conrad , MD;  Location: [facility];  Service: Vascular;  Laterality: Left;  . BASCILIC VEIN TRANSPOSITION Left 01/31/2017  Procedure: LEFT ARM BASILIC VEIN TRANSPOSITION, SECOND STAGE;  Surgeon: Conrad [place], MD;  Location: [facility];  Service: Vascular;  Laterality: Left;  . CARDIAC CATHETERIZATION  04-05-2010   checking for blockage but none-[facility]  . COLONOSCOPY    . CYSTOSCOPY W/ STONE MANIPULATION     "laser once" (01/22/2013)  . HEMATOMA EVACUATION Left 05/09/2017   Procedure: EVACUATION HEMATOMA LEFT ARM;  Surgeon: Conrad [place], MD;  Location: [facility];  Service: Vascular;  Laterality: Left;  . HERNIA REPAIR    . INGUINAL HERNIA REPAIR Right 11/06/2015   Procedure: OPEN HERNIA REPAIR  RIGHT INGUINAL ADULT;  Surgeon: [name], MD;  Location: [facility];  Service: General;   Laterality: Right;  with MESH  . INSERTION OF DIALYSIS CATHETER Right 10/05/2016   Procedure: INSERTION OF right internal jugular DIALYSIS CATHETER;  Surgeon: [name], MD;  Location: [facility];  Service: Vascular;  Laterality: Right;  . INSERTION OF MESH  03/20/2012   Procedure: INSERTION OF MESH;  UMB Surgeon: [name], MD;  Location: [facility];  Service: General;  Laterality: N/A;  . INSERTION OF MESH N/A 01/22/2013   Procedure: INSERTION OF MESH;  Surgeon: [name], MD;  Location: [place];  Service: General;  Laterality: N/A;  . LAPAROTOMY  04/02/2012   Procedure: EXPLORATORY LAPAROTOMY;  Surgeon: [name], MD;  Location: [facility];  Service: General;  Laterality: N/A;  Exploratory Laparotomy with resection of small intestine  . LEFT HEART CATHETERIZATION WITH CORONARY ANGIOGRAM N/A 05/14/2013   Procedure: LEFT HEART CATHETERIZATION WITH CORONARY ANGIOGRAM;  Surgeon: Sinclair Grooms, MD;  Location: [facility];  Service: Cardiovascular;  Laterality: N/A;  . LIGATION OF ARTERIOVENOUS  FISTULA Left 12/27/2016   Procedure: LIGATION/EXCISION OF LEFT UPPER ARM ARTERIOVENOUS  FISTULA;  EVACUATION OF HEMATOMA;  Surgeon: Conrad [place], MD;  Location: [facility];  Service: Vascular;  Laterality: Left;  . REVISON OF ARTERIOVENOUS FISTULA Left 10/05/2016   Procedure: REVISON OF left arm ARTERIOVENOUS FISTULA;  Surgeon: [name], MD;  Location: [place];  Service: Vascular;  Laterality: Left;  . TONSILLECTOMY    . UMBILICAL HERNIA REPAIR  03/20/2012   Procedure: HERNIA REPAIR UMBILICAL ADULT;  Surgeon: [name], MD;  Location: [place];  Service: General;  Laterality: N/A;  . UMBILICAL HERNIA REPAIR  01/22/2013   preperitoneal open procedure due to significant adhesions/notes 01/22/2013  . VENTRAL HERNIA REPAIR N/A 01/22/2013   Procedure: ATTEMPTED LAPAROSCOPIC VENTRAL HERNIA CONVERTED TO OPEN;  Surgeon: [name], MD;  [facility];  Service: General;  Laterality: N/A;      Family History  Problem Relation Age of Onset  . Heart disease Mother   . Hyperlipidemia Mother   . Hypertension Mother   . Kidney disease Father   . Stroke Father   . Kidney disease Brother   . Amblyopia Neg Hx   . Blindness Neg Hx   . Cataracts Neg Hx   . Diabetes Neg Hx   . Glaucoma Neg Hx   . Macular degeneration Neg Hx   . Retinal detachment Neg Hx   . Strabismus Neg Hx   . Retinitis pigmentosa Neg Hx      Social History   Socioeconomic History  . Marital status: Married    Spouse name: [name]  . Number of children: 3  . Years of education: 28  . Highest education level: Not on file  Occupational History    Comment: disabled  Social Needs  .  Financial resource strain: Not on file  . Food insecurity    Worry: Not on file    Inability: Not on file  . Transportation needs    Medical: Not on file    Non-medical: Not on file  Tobacco Use  . Smoking status: Never Smoker  . Smokeless tobacco: Never Used  Substance and Sexual Activity  . Alcohol use: No    Alcohol/week: 0.0 standard drinks  . Drug use: No  . Sexual activity: Yes  Lifestyle  . Physical activity    Days per week: Not on file    Minutes per session: Not on file  . Stress: Not on file  Relationships  . Social Herbalist on phone: Not on file    Gets together: Not on file    Attends religious service: Not on file    Active member of club or organization: Not on file    Attends meetings of clubs or organizations: Not on file    Relationship status: Not on file  . Intimate partner violence    Fear of current or ex partner: Not on file    Emotionally abused: Not on file    Physically abused: Not on file    Forced sexual activity: Not on file  Other Topics Concern  . Not on file  Social History Narrative   Patient is married [place]) and lives at home with his wife and children.   Patient has three children.   Patient is in disability.   Patient has a high school education.   Patient  is right-handed   Patient drinks very little soda.              BP 98/68   Pulse 97   Ht 5\' 7"  (1.702 m)   Wt 174 lb (78.9 kg)   SpO2 98%   BMI 27.25 kg/m   Physical Exam:  Well appearing NAD HEENT: Unremarkable Neck:  No JVD, no thyromegally Lymphatics:  No adenopathy Back:  No CVA tenderness Lungs:  Clear with no wheezes HEART:  Regular rate rhythm, no murmurs, no rubs, no clicks Abd:  soft, positive bowel sounds, no organomegally, no rebound, no guarding Ext:  2 plus pulses, no edema, no cyanosis, no clubbing Skin:  No rashes no nodules Neuro:  CN II through XII intact, motor grossly intact  EKG - reviewed. Probable atrial tachycardia with variable AV conduction.  Assess/Plan: 1. Atrial tachycardia - as he is asymptomatic, I think a reasonable option would be for rate control. He will start low dose toprol. I do not expect a significant drop in his BP. 2. Preoperative eval - he is low risk for cardiac complications from pending AV fistula placement.  3. Chronic systolic heart failure - he has class 2 symptoms and last EF 5 years ago was in the 30% range. His renal failure makes him a poor candidate for primary prevention ICD.  4. VT/PVC's - he is asymptomatic. He has not had any issues.  Mikle Bosworth.D.

## 2019-02-08 ENCOUNTER — Other Ambulatory Visit (INDEPENDENT_AMBULATORY_CARE_PROVIDER_SITE_OTHER): Payer: Self-pay | Admitting: Ophthalmology

## 2019-02-09 DIAGNOSIS — Z992 Dependence on renal dialysis: Secondary | ICD-10-CM | POA: Diagnosis not present

## 2019-02-09 DIAGNOSIS — N186 End stage renal disease: Secondary | ICD-10-CM | POA: Diagnosis not present

## 2019-02-09 DIAGNOSIS — N2581 Secondary hyperparathyroidism of renal origin: Secondary | ICD-10-CM | POA: Diagnosis not present

## 2019-02-11 DIAGNOSIS — N186 End stage renal disease: Secondary | ICD-10-CM | POA: Diagnosis not present

## 2019-02-11 DIAGNOSIS — N2581 Secondary hyperparathyroidism of renal origin: Secondary | ICD-10-CM | POA: Diagnosis not present

## 2019-02-11 DIAGNOSIS — Z992 Dependence on renal dialysis: Secondary | ICD-10-CM | POA: Diagnosis not present

## 2019-02-13 DIAGNOSIS — Z992 Dependence on renal dialysis: Secondary | ICD-10-CM | POA: Diagnosis not present

## 2019-02-13 DIAGNOSIS — N2581 Secondary hyperparathyroidism of renal origin: Secondary | ICD-10-CM | POA: Diagnosis not present

## 2019-02-13 DIAGNOSIS — N186 End stage renal disease: Secondary | ICD-10-CM | POA: Diagnosis not present

## 2019-02-14 ENCOUNTER — Ambulatory Visit (INDEPENDENT_AMBULATORY_CARE_PROVIDER_SITE_OTHER): Payer: Medicare Other | Admitting: Ophthalmology

## 2019-02-14 ENCOUNTER — Encounter (INDEPENDENT_AMBULATORY_CARE_PROVIDER_SITE_OTHER): Payer: Self-pay | Admitting: Ophthalmology

## 2019-02-14 ENCOUNTER — Other Ambulatory Visit: Payer: Self-pay

## 2019-02-14 DIAGNOSIS — H35412 Lattice degeneration of retina, left eye: Secondary | ICD-10-CM | POA: Diagnosis not present

## 2019-02-14 DIAGNOSIS — H34832 Tributary (branch) retinal vein occlusion, left eye, with macular edema: Secondary | ICD-10-CM | POA: Diagnosis not present

## 2019-02-14 DIAGNOSIS — H43813 Vitreous degeneration, bilateral: Secondary | ICD-10-CM | POA: Diagnosis not present

## 2019-02-14 DIAGNOSIS — H34831 Tributary (branch) retinal vein occlusion, right eye, with macular edema: Secondary | ICD-10-CM | POA: Diagnosis not present

## 2019-02-14 DIAGNOSIS — H209 Unspecified iridocyclitis: Secondary | ICD-10-CM | POA: Diagnosis not present

## 2019-02-14 DIAGNOSIS — H3581 Retinal edema: Secondary | ICD-10-CM | POA: Diagnosis not present

## 2019-02-14 DIAGNOSIS — H33332 Multiple defects of retina without detachment, left eye: Secondary | ICD-10-CM

## 2019-02-14 DIAGNOSIS — B0052 Herpesviral keratitis: Secondary | ICD-10-CM | POA: Diagnosis not present

## 2019-02-14 DIAGNOSIS — H25813 Combined forms of age-related cataract, bilateral: Secondary | ICD-10-CM

## 2019-02-15 DIAGNOSIS — N186 End stage renal disease: Secondary | ICD-10-CM | POA: Diagnosis not present

## 2019-02-15 DIAGNOSIS — Z992 Dependence on renal dialysis: Secondary | ICD-10-CM | POA: Diagnosis not present

## 2019-02-15 DIAGNOSIS — N2581 Secondary hyperparathyroidism of renal origin: Secondary | ICD-10-CM | POA: Diagnosis not present

## 2019-02-18 ENCOUNTER — Other Ambulatory Visit: Payer: Self-pay

## 2019-02-18 DIAGNOSIS — N186 End stage renal disease: Secondary | ICD-10-CM | POA: Diagnosis not present

## 2019-02-18 DIAGNOSIS — N2581 Secondary hyperparathyroidism of renal origin: Secondary | ICD-10-CM | POA: Diagnosis not present

## 2019-02-18 DIAGNOSIS — Z992 Dependence on renal dialysis: Secondary | ICD-10-CM | POA: Diagnosis not present

## 2019-02-18 NOTE — Progress Notes (Signed)
[facility] Note  02/21/2019     CHIEF COMPLAINT Patient presents for Retina Follow Up   HISTORY OF PRESENT ILLNESS: Michael [name]. is a 63 y.o. male who presents to the clinic today for:   HPI    Retina Follow Up    Patient presents with  CRVO/BRVO.  In left eye.  This started 6 months ago.  Since onset it is stable.  I, the attending physician,  performed the HPI with the patient and updated documentation appropriately.          Comments    F/U BRVO OS. Patient states his vision is about the same, denies new visual onsets/issues.       Last edited by [name], MD on 02/21/2019  3:33 PM. (History)    pt states he feels like the vision in his left eye is the same as last time, pt is having sx on Nov 5th to put a fistula in his right arm and block the one in the left arm off   Referring physician: Monna Fam, MD [place],  [place] [address]  HISTORICAL INFORMATION:   Selected notes from the [place] Referred by Dr. [name]. Hecker for concern of HRVO OD; LEE- 11.30.18 ([name]) {BCVA OD: 20/100-1 OS: 20/20-2] Ocular Hx- cataract OU PMH- HTN, high chol, kidney disease, sleep apnea, emphysema    CURRENT MEDICATIONS: Current Outpatient Medications (Ophthalmic Drugs)  Medication Sig  . dorzolamide-timolol (COSOPT) 22.3-6.8 MG/ML ophthalmic solution INSTILL 1 DROP INTO EACH EYE TWICE DAILY  . Ganciclovir (ZIRGAN) 0.15 % GEL Place 1 drop into the right eye 5 (five) times daily.   No current facility-administered medications for this visit.  (Ophthalmic Drugs)   Current Outpatient Medications (Other)  Medication Sig  . acyclovir (ZOVIRAX) 400 MG tablet Take 400 mg by mouth daily.   [name] aspirin EC 81 MG EC tablet Take 1 tablet (81 mg total) by mouth daily.  [name] atorvastatin (LIPITOR) 40 MG tablet Take 40 mg by mouth every evening.   . metoprolol succinate (TOPROL XL) 25 MG 24 hr tablet Take 0.5 tablets  (12.5 mg total) by mouth daily.  . nitroGLYCERIN (NITROSTAT) 0.4 MG SL tablet Place 1 tablet (0.4 mg total) under the tongue every 5 (five) minutes x 3 doses as needed for chest pain.  [name] OVER THE COUNTER MEDICATION Take 1 capsule by mouth daily. Immuno-shield otc supplement   No current facility-administered medications for this visit.  (Other)      REVIEW OF SYSTEMS: ROS    Positive for: Genitourinary, Endocrine, Eyes   Negative for: Constitutional, Gastrointestinal, Neurological, Skin, Musculoskeletal, HENT, Cardiovascular, Respiratory, Psychiatric, Allergic/Imm, Heme/Lymph   Last edited by Zenovia Jordan, LPN on 87/86/7672  0:94 AM. (History)       ALLERGIES No Known Allergies  PAST MEDICAL HISTORY Past Medical History:  Diagnosis Date  . Atrial arrhythmia    atrial tachycardia with variable AV conduction versus atypical aflutter 01/10/19, rate control (02/06/19)  . Cataract   . Dyspnea    on exertion  . ESRD (end stage renal disease) ([place])    Hemo- MWF, Polycystic kidney disease  . Fatigue   . History of kidney stones    removal of stone- cysto  . Hyperlipidemia   . Hyperparathyroidism, secondary renal ([place])   . Hypertension   . Hypoxemia 12/12/2013  . Nonischemic cardiomyopathy ([place])    Er 25% 2015, 55 % 2013  . OSA on  CPAP    no longer using cpap  . OSA on CPAP 03/24/2014  . Pneumonia    2015ish  . Ventricular tachycardia//Freq PVCs    Past Surgical History:  Procedure Laterality Date  . A/V FISTULAGRAM Left 04/27/2017   Procedure: A/V FISTULAGRAM;  Surgeon: Conrad Carlisle, MD;  Location: [place] [facility];  Service: Cardiovascular;  Laterality: Left;  lt arm  . A/V FISTULAGRAM Left 01/10/2019   Procedure: A/V FISTULAGRAM;  Surgeon: Marty Heck, MD;  Location: [facility];  Service: Cardiovascular;  Laterality: Left;  . APPENDECTOMY    . AV FISTULA PLACEMENT  12/05/2011   Procedure: ARTERIOVENOUS (AV) FISTULA CREATION;LLEFT ARM  Surgeon: Conrad Winigan, MD;  Location: [place];  Service: Vascular;  Laterality: Left;  RADIO-CEPHALIC  fistula left arm  . AV FISTULA PLACEMENT  01/11/2012   Procedure: ARTERIOVENOUS (AV) FISTULA CREATION;  Surgeon: Conrad Seldovia, MD;  Location: [place];  Service: Vascular;  Laterality: Left;  Creation of left brachial cephalic arteriovenous fistula  . BASCILIC VEIN TRANSPOSITION Left 12/27/2016   Procedure: BASILIC VEIN TRANSPOSITION LEFT UPPER ARM FIRST STAGE;  Surgeon: Conrad Central, MD;  Location: [place];  Service: Vascular;  Laterality: Left;  . BASCILIC VEIN TRANSPOSITION Left 01/31/2017   Procedure: LEFT ARM BASILIC VEIN TRANSPOSITION, SECOND STAGE;  Surgeon: Conrad [place], MD;  Location: [place];  Service: Vascular;  Laterality: Left;  . CARDIAC CATHETERIZATION  04-05-2010   checking for blockage but none-[facility]  . COLONOSCOPY    . CYSTOSCOPY W/ STONE MANIPULATION     "laser once" (01/22/2013)  . HEMATOMA EVACUATION Left 05/09/2017   Procedure: EVACUATION HEMATOMA LEFT ARM;  Surgeon: Conrad Jerome, MD;  Location: [place];  Service: Vascular;  Laterality: Left;  . HERNIA REPAIR    . INGUINAL HERNIA REPAIR Right 11/06/2015   Procedure: OPEN HERNIA REPAIR  RIGHT INGUINAL ADULT;  Surgeon: [name], MD;  Location: [facility];  Service: General;  Laterality: Right;  with MESH  . INSERTION OF DIALYSIS CATHETER Right 10/05/2016   Procedure: INSERTION OF right internal jugular DIALYSIS CATHETER;  Surgeon: [name], MD;  Location: [place];  Service: Vascular;  Laterality: Right;  . INSERTION OF MESH  03/20/2012   Procedure: INSERTION OF MESH;  UMB Surgeon: [name], MD;  Location: [place];  Service: General;  Laterality: N/A;  . INSERTION OF MESH N/A 01/22/2013   Procedure: INSERTION OF MESH;  Surgeon: [name], MD;  Location: [place];  Service: General;  Laterality: N/A;  . LAPAROTOMY  04/02/2012   Procedure: EXPLORATORY LAPAROTOMY;  Surgeon: [name], MD;  Location: [facility];  Service: General;   Laterality: N/A;  Exploratory Laparotomy with resection of small intestine  . LEFT HEART CATHETERIZATION WITH CORONARY ANGIOGRAM N/A 05/14/2013   Procedure: LEFT HEART CATHETERIZATION WITH CORONARY ANGIOGRAM;  Surgeon: Sinclair Grooms, MD;  Location: [facility];  Service: Cardiovascular;  Laterality: N/A;  . LIGATION OF ARTERIOVENOUS  FISTULA Left 12/27/2016   Procedure: LIGATION/EXCISION OF LEFT UPPER ARM ARTERIOVENOUS  FISTULA;  EVACUATION OF HEMATOMA;  Surgeon: Conrad Germantown, MD;  Location: [place];  Service: Vascular;  Laterality: Left;  . REVISON OF ARTERIOVENOUS FISTULA Left 10/05/2016   Procedure: REVISON OF left arm ARTERIOVENOUS FISTULA;  Surgeon: [name], MD;  Location: [place];  Service: Vascular;  Laterality: Left;  . TONSILLECTOMY    . UMBILICAL HERNIA REPAIR  03/20/2012   Procedure: HERNIA REPAIR UMBILICAL ADULT;  Surgeon: [name], MD;  Location: [place];  Service: General;  Laterality: N/A;  . UMBILICAL HERNIA REPAIR  01/22/2013   preperitoneal open procedure due to significant adhesions/notes 01/22/2013  . VENTRAL HERNIA REPAIR N/A 01/22/2013   Procedure: ATTEMPTED LAPAROSCOPIC VENTRAL HERNIA CONVERTED TO OPEN;  Surgeon: [name], MD;  [facility];  Service: General;  Laterality: N/A;    FAMILY HISTORY Family History  Problem Relation Age of Onset  . Heart disease Mother   . Hyperlipidemia Mother   . Hypertension Mother   . Kidney disease Father   . Stroke Father   . Kidney disease Brother   . Amblyopia Neg Hx   . Blindness Neg Hx   . Cataracts Neg Hx   . Diabetes Neg Hx   . Glaucoma Neg Hx   . Macular degeneration Neg Hx   . Retinal detachment Neg Hx   . Strabismus Neg Hx   . Retinitis pigmentosa Neg Hx     SOCIAL HISTORY Social History   Tobacco Use  . Smoking status: Never Smoker  . Smokeless tobacco: Never Used  Substance Use Topics  . Alcohol use: No    Alcohol/week: 0.0 standard drinks  . Drug use: No         OPHTHALMIC  EXAM:  Base Eye Exam    Visual Acuity (Snellen - Linear)      Right Left   Dist cc 20/30 -1 20/40 +2   Dist ph cc NI NI   Correction: Glasses       Tonometry (Tonopen, 8:33 AM)      Right Left   Pressure 14 15       Pupils      Dark Light Shape React APD   Right 4 3 Round Brisk None   Left 4 3 Round Brisk None       Visual Fields (Counting fingers)      Left Right    Full Full       Extraocular Movement      Right Left    Full, Ortho Full, Ortho       Neuro/Psych    Oriented x3: Yes   Mood/Affect: Normal       Dilation    Both eyes: 1.0% Mydriacyl, 2.5% Phenylephrine @ 8:27 AM        Slit Lamp and Fundus Exam    Slit Lamp Exam      Right Left   Lids/Lashes Dermatochalasis - upper lid Dermatochalasis - upper lid   Conjunctiva/Sclera Mild Melanosis, Nasal Pinguecula Mild Melanosis, nasal and temporal Pinguecula   Cornea Arcus, 1+ Punctate epithelial erosions Arcus, 1+ Punctate epithelial erosions   Anterior Chamber Deep and quiet Deep and quiet   Iris Round and dilated Round and dilated   Lens 2+ Nuclear sclerosis, 2+ Cortical cataract 2+ Nuclear sclerosis, 2+ Cortical cataract   Vitreous Vitreous syneresis, +Posterior vitreous detachment, trace cell Vitreous syneresis, Posterior vitreous detachment, +cell       Fundus Exam      Right Left   Disc Pink and Sharp inferior hyperemia - improved, +heme at 0400, fine NVD at 0800   C/D Ratio 0.5 0.6   Macula Flat, Blunted foveal reflex, mild RPE mottling and clumping, No heme or edema Blunted foveal reflex, focal blot hemes nasal macula extending from disc to fovea - slightly improved from prior, central RPE clumping   Vessels Vascular attenuation, Tortuous, mild Copper wiring Vascular attenuation, Tortuous, Copper wiring, severe AV crossing changes, +,  inferior HRVO   Periphery Attached, pigmented Chorioretinal scar at 1030 Attached, pigmented lattice with 3 atrophic holes at 1030 ora - good laser surrounding,  cluster of blot hemes greatest temporally and inferiorly greatest at 0300, pigmented macroaneurysm at 0300 - improving          IMAGING AND PROCEDURES  Imaging and Procedures for 08/31/17  OCT, Retina - OU - Both Eyes       Right Eye Quality was good. Central Foveal Thickness: 249. Progression has been stable. Findings include normal foveal contour, no SRF, no IRF, vitreomacular adhesion , inner retinal atrophy (Inner retinal atrophy IT quad caught on widefield).   Left Eye Quality was good. Central Foveal Thickness: 250. Progression has improved. Findings include normal foveal contour, no IRF, no SRF, epiretinal membrane, vitreomacular adhesion , outer retinal atrophy, intraretinal hyper-reflective material (Interval improvement in IRF, patchy ORA).   Notes *Images captured and stored on drive  Diagnosis / Impression:  OD: NFP, No IRF/SRF; Inner retinal atrophy IT quad caught on widefield - stable OS: trace ERM; Focal patches of ORA, interval improvement in IRF  Clinical management:  See below  Abbreviations: NFP - Normal foveal profile. CME - cystoid macular edema. PED - pigment epithelial detachment. IRF - intraretinal fluid. SRF - subretinal fluid. EZ - ellipsoid zone. ERM - epiretinal membrane. ORA - outer retinal atrophy. ORT - outer retinal tubulation. SRHM - subretinal hyper-reflective material         Intravitreal Injection, Pharmacologic Agent - OS - Left Eye       Time Out 02/21/2019. 9:10 AM. Confirmed correct patient, procedure, site, and patient consented.   Anesthesia Topical anesthesia was used. Anesthetic medications included Lidocaine 2%, Proparacaine 0.5%.   Procedure Preparation included 5% betadine to ocular surface, eyelid speculum. A supplied needle was used.   Injection:  1.25 mg Bevacizumab (AVASTIN) SOLN   NDC: 11572-620-35, Lot: [id number] , Expiration date: 04/17/2019   Route: Intravitreal, Site: Left Eye, Waste: 0 mL  Post-op Post  injection exam found visual acuity of at least counting fingers. The patient tolerated the procedure well. There were no complications. The patient received written and verbal post procedure care education.                 ASSESSMENT/PLAN:    ICD-10-CM   1. Branch retinal vein occlusion of left eye with macular edema  H34.8320 Intravitreal Injection, Pharmacologic Agent - OS - Left Eye    Bevacizumab (AVASTIN) SOLN 1.25 mg    CANCELED: Intravitreal Injection, Pharmacologic Agent - OD - Right Eye  2. Retinal edema  H35.81 OCT, Retina - OU - Both Eyes  3. Herpes keratitis  B00.52   4. Anterior uveitis  H20.9   5. Branch retinal vein occlusion of right eye with macular edema  H34.8310   6. Lattice degeneration of left retina  H35.412   7. Atrophic retinal break, multiple, left eye  H33.332   8. Posterior vitreous detachment of both eyes  H43.813   9. Combined forms of age-related cataract of both eyes  H25.813    1,2. Inferior HRVO OS  - just prior to 9.24.20 visit, found to have a clot in fistula, left arm  - exam shows clusters of DBH inferior hemispheric distribution and telangectatic vessels  - s/p IVA OS #1 (09.24.20)  - BCVA 20/40  - OCT shows scattered ORA and interval improvement in IRF  - recommend IVA OS #2 today, 10.22.20  - pt wishes to proceed  - RBA  of procedure discussed, questions answered  - informed consent obtained and signed  - see procedure note  - F/U 4 week -- DFE/OCT/possible injection  3,4. 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  - likely etiology of prior episodes of Anterior Uveitis OD  - FA on 3.18.19 without posterior involvement, vasculitis or leakage OD  - had recurrent AC cell and stellate KP on 3.11.2020 - restarted PF QID OD  - today KP and injection stably resolved, only rare pigment remains  - IOP 22 OD, 18 OS today   - cont Pred Forte to QD OD  - given history of repeat recurrences of anterior uveitis despite maintenance  acyclovir, may need to be on a maintenance dose of topical steroid  - cont Cosopt BID OU  - epithelial dendrite remains resolved today (on po acyclovir; off topical trifluridine)  - under the expert management of Dr. Kathlen Mody  - continue po acyclovir, 400 mg QD per Dr. Kathlen Mody  5. History of BRVO w/ CME OD  - s/p IVA OD #1 (12.21.18), #2 (01.18.19), #3 (02.14.19), #4 (04.02.19), #5 (05.02.19)  - OCT today with stable improvement in IRF despite no antiVEGF therapy since May 2019 -- resolved  - will continue to hold on injection OD  6,7. Lattice degeneration with atrophic holes OS-   - peripheral holes superiorly at 1030 without SRF or RD  - S/P laser retinopexy OS (01.04.19)  - good laser in place--stable  8. PVD / vitreous syneresis OU  - Discussed findings and prognosis  - No RT or RD on 360 scleral depressed exam  - Reviewed s/s of RT/RD  - Strict return precautions for any such RT/RD signs/symptoms  9. Combined forms age-related cataract OU-   - The symptoms of cataract, surgical options, and treatments and risks were discussed with patient.  - discussed diagnosis and progression  - not yet visually significant  - monitor for now   Ophthalmic Meds Ordered this visit:  Meds ordered this encounter  Medications  . Bevacizumab (AVASTIN) SOLN 1.25 mg       Return in about 4 weeks (around 03/21/2019) for f/u HRVO OS, DFE, OCT.  There are no Patient Instructions on file for this visit.   This document serves as a record of services personally performed by Gardiner Sleeper, MD, PhD. It was created on their behalf by Roselee Nova, COMT. The creation of this record is the provider's dictation and/or activities during the visit.  Electronically signed by: Roselee Nova, COMT 02/24/19 12:34 AM  Gardiner Sleeper, M.D., Ph.D. Diseases & Surgery of the Retina and [place] 02/24/19  I have reviewed the above documentation for accuracy and  completeness, and I agree with the above. Gardiner Sleeper, M.D., Ph.D. 02/24/19 12:34 AM   Abbreviations: M myopia (nearsighted); A astigmatism; H hyperopia (farsighted); P presbyopia; Mrx spectacle prescription;  CTL contact lenses; OD right eye; OS left eye; OU both eyes  XT exotropia; ET esotropia; PEK punctate epithelial keratitis; PEE punctate epithelial erosions; DES dry eye syndrome; MGD meibomian gland dysfunction; ATs artificial tears; PFAT's preservative free artificial tears; [place] nuclear sclerotic cataract; PSC posterior subcapsular cataract; ERM epi-retinal membrane; PVD posterior vitreous detachment; RD retinal detachment; DM diabetes mellitus; DR diabetic retinopathy; NPDR non-proliferative diabetic retinopathy; PDR proliferative diabetic retinopathy; CSME clinically significant macular edema; DME diabetic macular edema; dbh dot blot hemorrhages; CWS cotton wool spot; POAG primary open angle glaucoma; C/D cup-to-disc ratio; HVF humphrey visual field;  GVF goldmann visual field; OCT optical coherence tomography; IOP intraocular pressure; BRVO Branch retinal vein occlusion; CRVO central retinal vein occlusion; CRAO central retinal artery occlusion; BRAO branch retinal artery occlusion; RT retinal tear; SB scleral buckle; PPV pars plana vitrectomy; VH Vitreous hemorrhage; PRP panretinal laser photocoagulation; IVK intravitreal kenalog; VMT vitreomacular traction; MH Macular hole;  NVD neovascularization of the disc; NVE neovascularization elsewhere; AREDS age related eye disease study; ARMD age related macular degeneration; POAG primary open angle glaucoma; EBMD epithelial/anterior basement membrane dystrophy; ACIOL anterior chamber intraocular lens; IOL intraocular lens; PCIOL posterior chamber intraocular lens; Phaco/IOL phacoemulsification with intraocular lens placement; [place] photorefractive keratectomy; LASIK laser assisted in situ keratomileusis; HTN hypertension; DM diabetes mellitus; COPD  chronic obstructive pulmonary disease

## 2019-02-20 DIAGNOSIS — N2581 Secondary hyperparathyroidism of renal origin: Secondary | ICD-10-CM | POA: Diagnosis not present

## 2019-02-20 DIAGNOSIS — N186 End stage renal disease: Secondary | ICD-10-CM | POA: Diagnosis not present

## 2019-02-20 DIAGNOSIS — Z992 Dependence on renal dialysis: Secondary | ICD-10-CM | POA: Diagnosis not present

## 2019-02-21 ENCOUNTER — Ambulatory Visit (INDEPENDENT_AMBULATORY_CARE_PROVIDER_SITE_OTHER): Payer: Medicare Other | Admitting: Ophthalmology

## 2019-02-21 ENCOUNTER — Encounter (INDEPENDENT_AMBULATORY_CARE_PROVIDER_SITE_OTHER): Payer: Self-pay | Admitting: Ophthalmology

## 2019-02-21 DIAGNOSIS — H3581 Retinal edema: Secondary | ICD-10-CM

## 2019-02-21 DIAGNOSIS — H25813 Combined forms of age-related cataract, bilateral: Secondary | ICD-10-CM | POA: Diagnosis not present

## 2019-02-21 DIAGNOSIS — H43813 Vitreous degeneration, bilateral: Secondary | ICD-10-CM

## 2019-02-21 DIAGNOSIS — B0052 Herpesviral keratitis: Secondary | ICD-10-CM | POA: Diagnosis not present

## 2019-02-21 DIAGNOSIS — H209 Unspecified iridocyclitis: Secondary | ICD-10-CM | POA: Diagnosis not present

## 2019-02-21 DIAGNOSIS — H34831 Tributary (branch) retinal vein occlusion, right eye, with macular edema: Secondary | ICD-10-CM | POA: Diagnosis not present

## 2019-02-21 DIAGNOSIS — H35412 Lattice degeneration of retina, left eye: Secondary | ICD-10-CM | POA: Diagnosis not present

## 2019-02-21 DIAGNOSIS — H33332 Multiple defects of retina without detachment, left eye: Secondary | ICD-10-CM | POA: Diagnosis not present

## 2019-02-21 DIAGNOSIS — H34832 Tributary (branch) retinal vein occlusion, left eye, with macular edema: Secondary | ICD-10-CM

## 2019-02-21 MED ORDER — BEVACIZUMAB CHEMO INJECTION 1.25MG/0.05ML SYRINGE FOR KALEIDOSCOPE
1.2500 mg | INTRAVITREAL | Status: AC | PRN
  Administered 2019-02-21: 16:00:00 1.25 mg via INTRAVITREAL

## 2019-02-22 ENCOUNTER — Encounter (HOSPITAL_COMMUNITY): Payer: Self-pay | Admitting: Vascular Surgery

## 2019-02-22 DIAGNOSIS — N186 End stage renal disease: Secondary | ICD-10-CM | POA: Diagnosis not present

## 2019-02-22 DIAGNOSIS — N2581 Secondary hyperparathyroidism of renal origin: Secondary | ICD-10-CM | POA: Diagnosis not present

## 2019-02-22 DIAGNOSIS — Z992 Dependence on renal dialysis: Secondary | ICD-10-CM | POA: Diagnosis not present

## 2019-02-22 NOTE — Anesthesia Preprocedure Evaluation (Addendum)
Anesthesia Evaluation  Patient identified by [name], date of birth, ID band Patient awake    Reviewed: Allergy & Precautions, NPO status , Patient's Chart, lab work & pertinent test results  History of Anesthesia Complications Negative for: history of anesthetic complications  Airway Mallampati: II  TM Distance: >3 FB Neck ROM: Full    Dental no notable dental hx. (+) Dental Advisory Given   Pulmonary sleep apnea ,    Pulmonary exam normal        Cardiovascular hypertension, + CAD  Normal cardiovascular exam+ dysrhythmias   Patient is a 63 year old male scheduled for the above procedure. Surgery was initially scheduled for 01/31/19, but postponed due to new aflutter on 01/10/19 EKG. Since then he saw EP cardiologist Dr. Lovena Le on 02/06/19. He reviewed EKG tracing and thought "probable atrial tachycardia with variable AV conduction". Patient asymptomatic, so rate control felt to be a reasonable option. Per Dr. Lovena Le, "he is low risk for cardiac complications from pending AV fistula placement."  In regards to his chronic systolic heart failure. He felt renal failure make patient a poor candidate for primary prevention ICD.  He was started on Toprol XL 25 mg 1/2 tablet daily.    Neuro/Psych negative neurological ROS  negative psych ROS   GI/Hepatic negative GI ROS,   Endo/Other  negative endocrine ROS  Renal/GU ESRF and DialysisRenal disease     Musculoskeletal   Abdominal   Peds  Hematology negative hematology ROS (+)   Anesthesia Other Findings   Reproductive/Obstetrics                            Anesthesia Physical Anesthesia Plan  ASA: III  Anesthesia Plan: MAC   Post-op Pain Management:    Induction:   PONV Risk Score and Plan: Ondansetron and Propofol infusion  Airway Management Planned: Natural Airway  Additional Equipment:   Intra-op Plan:   Post-operative Plan:   Informed  Consent: I have reviewed the patients History and Physical, chart, labs and discussed the procedure including the risks, benefits and alternatives for the proposed anesthesia with the patient or authorized representative who has indicated his/her understanding and acceptance.     Dental advisory given  Plan Discussed with: Anesthesiologist and CRNA  Anesthesia Plan Comments: (PAT note written 02/22/2019 by Myra Gianotti, PA-C. )       Anesthesia Quick Evaluation

## 2019-02-22 NOTE — Progress Notes (Signed)
Anesthesia Chart Review: SAME DAY WORK-UP   Case: [id number] Date/Time: 03/07/19 1213   Procedures:      LIGATION OF ARTERIOVENOUS  FISTULA LEFT ARM (Left )     ARTERIOVENOUS (AV) FISTULA CREATION VS ARTERIOVENOUS GRAFT RIGHT ARM (Right )   Anesthesia type: Monitor Anesthesia Care   Pre-op diagnosis: END STAGE RENAL DISEASE   Location: [facility] OR ROOM 12 / [place] [facility]   Surgeon: Marty Heck, MD      DISCUSSION: Patient is a 63 year old male scheduled for the above procedure. Surgery was initially scheduled for 01/31/19, but postponed due to new aflutter on 01/10/19 EKG. Since then he saw EP cardiologist Dr. Lovena Le on 02/06/19. He reviewed EKG tracing and thought "probable atrial tachycardia with variable AV conduction". Patient asymptomatic, so rate control felt to be a reasonable option. Per Dr. Lovena Le, "he is low risk for cardiac complications from pending AV fistula placement."  In regards to his chronic systolic heart failure. He felt renal failure make patient a poor candidate for primary prevention ICD.  He was started on Toprol XL 25 mg 1/2 tablet daily.  Other history includes never smoker, non-ischemic cardiomyopathy (2015), VT/frequent PVCs (05/2013), HTN, hyperlipidemia, ESRD (on hemodialysis,MWF), polycystic kidney disease, OSA, exertional dyspnea. He is s/p multiple hemodialysis access procedures, last few include 2nd stage left basilic vein transposition 01/31/17, evacuation of LUE hematoma 05/09/17, and AV fistulagram (not done with anesthesia) on 01/10/19.   Pre-surgical COVID-19 test has not yet been scheduled as surgery not until 03/07/19. He will need ISTAT labs on arrival.    VS:  BP Readings from Last 3 Encounters:  02/06/19 98/68  01/10/19 110/82  01/08/19 117/82   Pulse Readings from Last 3 Encounters:  02/06/19 97  01/10/19 87  01/08/19 95    PROVIDERS: [name], MD is PCP Cristopher Peru, MD is EP cardiologist. As above, last visit 02/06/19. He had seen Virl Axe,  MD previously for non-ischemic cardiomyopathy and PVCs, but not since 10/24/13.    LABS: For day of surgery.     EKG: 01/10/19:  Atypical atrial flutter with premature ventricular or aberrantly conducted complexes Left axis deviation Non-specific intra-ventricular conduction block Inferior infarct , age undetermined Abnormal ECG Since last tracing Atrial flutter NOW PRESENT Confirmed by Skeet Latch [phone]) on 01/11/2019 3:35:42 PM - Per Cristopher Peru, MD, "Probable atrial tachycardia with variable AV conduction."   CV: Echo 07/23/13: - Left ventricle: The cavity size was mildly dilated. Wall thickness was normal. Systolic function was severely reduced. The estimated ejection fraction was in the range of 25% to 30%. There is akinesis of the posterior myocardium. There is severe hypokinesis of the lateral myocardium. There is hypokinesis of the anterior myocardium. Doppler parameters are consistent with abnormal left ventricular relaxation (grade 1 diastolic dysfunction). - Aortic valve: Mild regurgitation. - Aortic root: The aortic root was mildly dilated. - Left atrium: The atrium was mildly dilated.  Cardiac cath 05/14/13: 1. Nonischemic cardiomyopathywith reduced EF less than 25% and severely elevated left ventricular end-diastolic pressures 2. Widely patent coronary arteries   Past Medical History:  Diagnosis Date  . Atrial arrhythmia    atrial tachycardia with variable AV conduction versus atypical aflutter 01/10/19, rate control (02/06/19)  . Cataract   . Dyspnea    on exertion  . ESRD (end stage renal disease) ([place])    Hemo- MWF, Polycystic kidney disease  . Fatigue   . History of kidney stones    removal of stone- cysto  .  Hyperlipidemia   . Hyperparathyroidism, secondary renal ([place])   . Hypertension   . Hypoxemia 12/12/2013  . Nonischemic cardiomyopathy ([place])    Er 25% 2015, 55 % 2013  . OSA on CPAP    no longer using cpap  . OSA on CPAP 03/24/2014  .  Pneumonia    2015ish  . Ventricular tachycardia//Freq PVCs     Past Surgical History:  Procedure Laterality Date  . A/V FISTULAGRAM Left 04/27/2017   Procedure: A/V FISTULAGRAM;  Surgeon: Conrad Meeker, MD;  Location: [place] [facility];  Service: Cardiovascular;  Laterality: Left;  lt arm  . A/V FISTULAGRAM Left 01/10/2019   Procedure: A/V FISTULAGRAM;  Surgeon: Marty Heck, MD;  Location: [place] [facility];  Service: Cardiovascular;  Laterality: Left;  . APPENDECTOMY    . AV FISTULA PLACEMENT  12/05/2011   Procedure: ARTERIOVENOUS (AV) FISTULA CREATION;LLEFT ARM  Surgeon: Conrad [place], MD;  Location: [place];  Service: Vascular;  Laterality: Left;  RADIO-CEPHALIC  fistula left arm  . AV FISTULA PLACEMENT  01/11/2012   Procedure: ARTERIOVENOUS (AV) FISTULA CREATION;  Surgeon: Conrad Willowbrook, MD;  Location: [place];  Service: Vascular;  Laterality: Left;  Creation of left brachial cephalic arteriovenous fistula  . BASCILIC VEIN TRANSPOSITION Left 12/27/2016   Procedure: BASILIC VEIN TRANSPOSITION LEFT UPPER ARM FIRST STAGE;  Surgeon: Conrad [place], MD;  Location: [place];  Service: Vascular;  Laterality: Left;  . BASCILIC VEIN TRANSPOSITION Left 01/31/2017   Procedure: LEFT ARM BASILIC VEIN TRANSPOSITION, SECOND STAGE;  Surgeon: Conrad Ulm, MD;  Location: [place];  Service: Vascular;  Laterality: Left;  . CARDIAC CATHETERIZATION  04-05-2010   checking for blockage but none-[facility]  . COLONOSCOPY    . CYSTOSCOPY W/ STONE MANIPULATION     "laser once" (01/22/2013)  . HEMATOMA EVACUATION Left 05/09/2017   Procedure: EVACUATION HEMATOMA LEFT ARM;  Surgeon: Conrad Mercer, MD;  Location: [place];  Service: Vascular;  Laterality: Left;  . HERNIA REPAIR    . INGUINAL HERNIA REPAIR Right 11/06/2015   Procedure: OPEN HERNIA REPAIR  RIGHT INGUINAL ADULT;  Surgeon: [name], MD;  Location: [facility];  Service: General;  Laterality: Right;  with MESH  . INSERTION OF DIALYSIS CATHETER Right 10/05/2016    Procedure: INSERTION OF right internal jugular DIALYSIS CATHETER;  Surgeon: [name], MD;  Location: [place];  Service: Vascular;  Laterality: Right;  . INSERTION OF MESH  03/20/2012   Procedure: INSERTION OF MESH;  UMB Surgeon: [name], MD;  Location: [place];  Service: General;  Laterality: N/A;  . INSERTION OF MESH N/A 01/22/2013   Procedure: INSERTION OF MESH;  Surgeon: [name], MD;  Location: [place];  Service: General;  Laterality: N/A;  . LAPAROTOMY  04/02/2012   Procedure: EXPLORATORY LAPAROTOMY;  Surgeon: [name], MD;  Location: [place];  Service: General;  Laterality: N/A;  Exploratory Laparotomy with resection of small intestine  . LEFT HEART CATHETERIZATION WITH CORONARY ANGIOGRAM N/A 05/14/2013   Procedure: LEFT HEART CATHETERIZATION WITH CORONARY ANGIOGRAM;  Surgeon: Sinclair Grooms, MD;  Location: [facility] CATH LAB;  Service: Cardiovascular;  Laterality: N/A;  . LIGATION OF ARTERIOVENOUS  FISTULA Left 12/27/2016   Procedure: LIGATION/EXCISION OF LEFT UPPER ARM ARTERIOVENOUS  FISTULA;  EVACUATION OF HEMATOMA;  Surgeon: Conrad , MD;  Location: [place];  Service: Vascular;  Laterality: Left;  . REVISON OF ARTERIOVENOUS FISTULA Left 10/05/2016   Procedure: REVISON OF left arm ARTERIOVENOUS  FISTULA;  Surgeon: [name], MD;  Location: [place];  Service: Vascular;  Laterality: Left;  . TONSILLECTOMY    . UMBILICAL HERNIA REPAIR  03/20/2012   Procedure: HERNIA REPAIR UMBILICAL ADULT;  Surgeon: [name], MD;  Location: [place];  Service: General;  Laterality: N/A;  . UMBILICAL HERNIA REPAIR  01/22/2013   preperitoneal open procedure due to significant adhesions/notes 01/22/2013  . VENTRAL HERNIA REPAIR N/A 01/22/2013   Procedure: ATTEMPTED LAPAROSCOPIC VENTRAL HERNIA CONVERTED TO OPEN;  Surgeon: [name], MD;  Location: [place];  Service: General;  Laterality: N/A;    MEDICATIONS: No current facility-administered medications for this encounter.    [name]  acyclovir (ZOVIRAX) 400 MG tablet  . aspirin EC 81 MG EC tablet  . atorvastatin (LIPITOR) 40 MG tablet  . dorzolamide-timolol (COSOPT) 22.3-6.8 MG/ML ophthalmic solution  . Ganciclovir (ZIRGAN) 0.15 % GEL  . metoprolol succinate (TOPROL XL) 25 MG 24 hr tablet  . nitroGLYCERIN (NITROSTAT) 0.4 MG SL tablet  . OVER THE COUNTER MEDICATION    Myra Gianotti, PA-C Surgical Short Stay/Anesthesiology [facility] Phone [phone] [facility] Phone [phone] 02/22/2019 2:35 PM

## 2019-02-25 DIAGNOSIS — N2581 Secondary hyperparathyroidism of renal origin: Secondary | ICD-10-CM | POA: Diagnosis not present

## 2019-02-25 DIAGNOSIS — N186 End stage renal disease: Secondary | ICD-10-CM | POA: Diagnosis not present

## 2019-02-25 DIAGNOSIS — Z992 Dependence on renal dialysis: Secondary | ICD-10-CM | POA: Diagnosis not present

## 2019-02-27 DIAGNOSIS — Z992 Dependence on renal dialysis: Secondary | ICD-10-CM | POA: Diagnosis not present

## 2019-02-27 DIAGNOSIS — N186 End stage renal disease: Secondary | ICD-10-CM | POA: Diagnosis not present

## 2019-02-27 DIAGNOSIS — N2581 Secondary hyperparathyroidism of renal origin: Secondary | ICD-10-CM | POA: Diagnosis not present

## 2019-03-01 DIAGNOSIS — N186 End stage renal disease: Secondary | ICD-10-CM | POA: Diagnosis not present

## 2019-03-01 DIAGNOSIS — Z992 Dependence on renal dialysis: Secondary | ICD-10-CM | POA: Diagnosis not present

## 2019-03-01 DIAGNOSIS — N2581 Secondary hyperparathyroidism of renal origin: Secondary | ICD-10-CM | POA: Diagnosis not present

## 2019-03-03 DIAGNOSIS — Z992 Dependence on renal dialysis: Secondary | ICD-10-CM | POA: Diagnosis not present

## 2019-03-03 DIAGNOSIS — Q612 Polycystic kidney, adult type: Secondary | ICD-10-CM | POA: Diagnosis not present

## 2019-03-03 DIAGNOSIS — N186 End stage renal disease: Secondary | ICD-10-CM | POA: Diagnosis not present

## 2019-03-04 DIAGNOSIS — N186 End stage renal disease: Secondary | ICD-10-CM | POA: Diagnosis not present

## 2019-03-04 DIAGNOSIS — N2581 Secondary hyperparathyroidism of renal origin: Secondary | ICD-10-CM | POA: Diagnosis not present

## 2019-03-04 DIAGNOSIS — Z992 Dependence on renal dialysis: Secondary | ICD-10-CM | POA: Diagnosis not present

## 2019-03-05 ENCOUNTER — Other Ambulatory Visit: Payer: Self-pay

## 2019-03-05 ENCOUNTER — Encounter (HOSPITAL_COMMUNITY): Payer: Self-pay | Admitting: *Deleted

## 2019-03-05 ENCOUNTER — Other Ambulatory Visit (HOSPITAL_COMMUNITY)
Admission: RE | Admit: 2019-03-05 | Discharge: 2019-03-05 | Disposition: A | Discharge status: Home / Self Care | Payer: Medicare Other | Source: Ambulatory Visit | Attending: Vascular Surgery | Admitting: Vascular Surgery

## 2019-03-05 DIAGNOSIS — Z20828 Contact with and (suspected) exposure to other viral communicable diseases: Secondary | ICD-10-CM | POA: Diagnosis not present

## 2019-03-05 DIAGNOSIS — Z01812 Encounter for preprocedural laboratory examination: Secondary | ICD-10-CM | POA: Diagnosis not present

## 2019-03-05 LAB — SARS CORONAVIRUS 2 (TAT 6-24 HRS): SARS Coronavirus 2: NEGATIVE

## 2019-03-05 NOTE — Progress Notes (Signed)
Pt denies having SOB and chest pain. Pt stated that he is under the care of Dr. Cristopher Peru, Cardiology and Dr. [name], PCP. Pt made aware to stop taking Immuno Shield supplement, vitamins, fish oil and herbal medications. Do not take any NSAIDs ie: Ibuprofen, Advil, Naproxen (Aleve), Motrin, BC and Goody Powder. Pt stated that he stopped taking Metoprolol because " it made my blood pressure too low." Pt agreed to make Dr. Cristopher Peru (prescribed Metoprolol ) know that he stopped taking the medication. Pt verbalized understanding of all pre-op instructions. See PA, Anesthesiology, note.

## 2019-03-06 DIAGNOSIS — N2581 Secondary hyperparathyroidism of renal origin: Secondary | ICD-10-CM | POA: Diagnosis not present

## 2019-03-06 DIAGNOSIS — Z992 Dependence on renal dialysis: Secondary | ICD-10-CM | POA: Diagnosis not present

## 2019-03-06 DIAGNOSIS — N186 End stage renal disease: Secondary | ICD-10-CM | POA: Diagnosis not present

## 2019-03-07 ENCOUNTER — Ambulatory Visit (HOSPITAL_COMMUNITY): Payer: Medicare Other | Admitting: Vascular Surgery

## 2019-03-07 ENCOUNTER — Other Ambulatory Visit (INDEPENDENT_AMBULATORY_CARE_PROVIDER_SITE_OTHER): Payer: Self-pay | Admitting: Ophthalmology

## 2019-03-07 ENCOUNTER — Encounter (HOSPITAL_COMMUNITY): Payer: Self-pay

## 2019-03-07 ENCOUNTER — Encounter (HOSPITAL_COMMUNITY)
Admission: RE | Disposition: A | Discharge status: Home / Self Care | Payer: Self-pay | Source: Ambulatory Visit | Attending: Vascular Surgery

## 2019-03-07 ENCOUNTER — Other Ambulatory Visit: Payer: Self-pay

## 2019-03-07 ENCOUNTER — Ambulatory Visit (HOSPITAL_COMMUNITY)
Admission: RE | Admit: 2019-03-07 | Discharge: 2019-03-07 | Disposition: A | Discharge status: Home / Self Care | Payer: Medicare Other | Source: Ambulatory Visit | Attending: Vascular Surgery | Admitting: Vascular Surgery

## 2019-03-07 DIAGNOSIS — Z87442 Personal history of urinary calculi: Secondary | ICD-10-CM | POA: Insufficient documentation

## 2019-03-07 DIAGNOSIS — Z79899 Other long term (current) drug therapy: Secondary | ICD-10-CM | POA: Diagnosis not present

## 2019-03-07 DIAGNOSIS — Z992 Dependence on renal dialysis: Secondary | ICD-10-CM | POA: Insufficient documentation

## 2019-03-07 DIAGNOSIS — I120 Hypertensive chronic kidney disease with stage 5 chronic kidney disease or end stage renal disease: Secondary | ICD-10-CM | POA: Insufficient documentation

## 2019-03-07 DIAGNOSIS — G4733 Obstructive sleep apnea (adult) (pediatric): Secondary | ICD-10-CM | POA: Insufficient documentation

## 2019-03-07 DIAGNOSIS — E1122 Type 2 diabetes mellitus with diabetic chronic kidney disease: Secondary | ICD-10-CM | POA: Insufficient documentation

## 2019-03-07 DIAGNOSIS — N2581 Secondary hyperparathyroidism of renal origin: Secondary | ICD-10-CM | POA: Diagnosis not present

## 2019-03-07 DIAGNOSIS — X58XXXA Exposure to other specified factors, initial encounter: Secondary | ICD-10-CM | POA: Diagnosis not present

## 2019-03-07 DIAGNOSIS — T82868A Thrombosis of vascular prosthetic devices, implants and grafts, initial encounter: Secondary | ICD-10-CM | POA: Insufficient documentation

## 2019-03-07 DIAGNOSIS — Z823 Family history of stroke: Secondary | ICD-10-CM | POA: Insufficient documentation

## 2019-03-07 DIAGNOSIS — Z841 Family history of disorders of kidney and ureter: Secondary | ICD-10-CM | POA: Insufficient documentation

## 2019-03-07 DIAGNOSIS — Z8249 Family history of ischemic heart disease and other diseases of the circulatory system: Secondary | ICD-10-CM | POA: Diagnosis not present

## 2019-03-07 DIAGNOSIS — I428 Other cardiomyopathies: Secondary | ICD-10-CM | POA: Insufficient documentation

## 2019-03-07 DIAGNOSIS — N186 End stage renal disease: Secondary | ICD-10-CM | POA: Diagnosis not present

## 2019-03-07 DIAGNOSIS — Z7982 Long term (current) use of aspirin: Secondary | ICD-10-CM | POA: Diagnosis not present

## 2019-03-07 DIAGNOSIS — I472 Ventricular tachycardia: Secondary | ICD-10-CM | POA: Diagnosis not present

## 2019-03-07 DIAGNOSIS — Q613 Polycystic kidney, unspecified: Secondary | ICD-10-CM | POA: Diagnosis not present

## 2019-03-07 DIAGNOSIS — E785 Hyperlipidemia, unspecified: Secondary | ICD-10-CM | POA: Insufficient documentation

## 2019-03-07 DIAGNOSIS — I493 Ventricular premature depolarization: Secondary | ICD-10-CM | POA: Insufficient documentation

## 2019-03-07 DIAGNOSIS — R0600 Dyspnea, unspecified: Secondary | ICD-10-CM | POA: Diagnosis not present

## 2019-03-07 DIAGNOSIS — T82898A Other specified complication of vascular prosthetic devices, implants and grafts, initial encounter: Secondary | ICD-10-CM | POA: Diagnosis not present

## 2019-03-07 HISTORY — DX: Other specified cardiac arrhythmias: I49.8

## 2019-03-07 HISTORY — PX: LIGATION OF ARTERIOVENOUS  FISTULA: SHX5948

## 2019-03-07 HISTORY — PX: AV FISTULA PLACEMENT: SHX1204

## 2019-03-07 HISTORY — DX: Presence of spectacles and contact lenses: Z97.3

## 2019-03-07 LAB — POCT I-STAT, CHEM 8
BUN: 26 mg/dL — ABNORMAL HIGH (ref 8–23)
BUN: 22 mg/dL (ref 8–23)
Calcium, Ion: 1.04 mmol/L — ABNORMAL LOW (ref 1.15–1.40)
Calcium, Ion: 1.27 mmol/L (ref 1.15–1.40)
Chloride: 103 mmol/L (ref 98–111)
Chloride: 100 mmol/L (ref 98–111)
Creatinine, Ser: 6.5 mg/dL — ABNORMAL HIGH (ref 0.61–1.24)
Creatinine, Ser: 6.2 mg/dL — ABNORMAL HIGH (ref 0.61–1.24)
Glucose, Bld: 69 mg/dL — ABNORMAL LOW (ref 70–99)
Glucose, Bld: 78 mg/dL (ref 70–99)
HCT: 42 % (ref 39.0–52.0)
HCT: 45 % (ref 39.0–52.0)
Hemoglobin: 14.3 g/dL (ref 13.0–17.0)
Hemoglobin: 15.3 g/dL (ref 13.0–17.0)
Potassium: 6.2 mmol/L — ABNORMAL HIGH (ref 3.5–5.1)
Potassium: 3.7 mmol/L (ref 3.5–5.1)
Sodium: 135 mmol/L (ref 135–145)
Sodium: 140 mmol/L (ref 135–145)
TCO2: 25 mmol/L (ref 22–32)
TCO2: 28 mmol/L (ref 22–32)

## 2019-03-07 SURGERY — LIGATION OF ARTERIOVENOUS  FISTULA
Anesthesia: Monitor Anesthesia Care | Site: Arm Lower | Laterality: Right

## 2019-03-07 MED ORDER — FENTANYL CITRATE (PF) 250 MCG/5ML IJ SOLN
INTRAMUSCULAR | Status: AC
  Filled 2019-03-07: qty 5

## 2019-03-07 MED ORDER — LIDOCAINE HCL 1 % IJ SOLN
INTRAMUSCULAR | Status: DC | PRN
  Administered 2019-03-07: 12 mL via INTRADERMAL

## 2019-03-07 MED ORDER — ACETAMINOPHEN 500 MG PO TABS
ORAL_TABLET | ORAL | Status: AC
  Filled 2019-03-07: qty 2

## 2019-03-07 MED ORDER — PROMETHAZINE HCL 25 MG/ML IJ SOLN: 6.2500 mg | INTRAMUSCULAR | Status: DC | PRN

## 2019-03-07 MED ORDER — HYDROCODONE-ACETAMINOPHEN 5-325 MG PO TABS: 1.0000 | ORAL_TABLET | Freq: Four times a day (QID) | ORAL | 0 refills | Status: DC | PRN

## 2019-03-07 MED ORDER — METOPROLOL SUCCINATE ER 25 MG PO TB24
12.5000 mg | ORAL_TABLET | Freq: Every day | ORAL | Status: AC
  Administered 2019-03-07: 12.5 mg via ORAL
  Filled 2019-03-07: qty 1

## 2019-03-07 MED ORDER — SODIUM CHLORIDE 0.9 % IV SOLN
INTRAVENOUS | Status: AC
  Filled 2019-03-07: qty 1.2

## 2019-03-07 MED ORDER — FENTANYL CITRATE (PF) 100 MCG/2ML IJ SOLN: 25.0000 ug | INTRAMUSCULAR | Status: DC | PRN

## 2019-03-07 MED ORDER — PROPOFOL 10 MG/ML IV BOLUS
INTRAVENOUS | Status: AC
  Filled 2019-03-07: qty 20

## 2019-03-07 MED ORDER — CEFAZOLIN SODIUM-DEXTROSE 2-4 GM/100ML-% IV SOLN
2.0000 g | INTRAVENOUS | Status: AC
  Administered 2019-03-07: 2 g via INTRAVENOUS
  Filled 2019-03-07: qty 100

## 2019-03-07 MED ORDER — PROPOFOL 10 MG/ML IV BOLUS
INTRAVENOUS | Status: DC | PRN
  Administered 2019-03-07: 15 mg via INTRAVENOUS

## 2019-03-07 MED ORDER — LIDOCAINE 2% (20 MG/ML) 5 ML SYRINGE
INTRAMUSCULAR | Status: AC
  Filled 2019-03-07: qty 5

## 2019-03-07 MED ORDER — SODIUM CHLORIDE 0.9 % IV SOLN
INTRAVENOUS | Status: DC | PRN
  Administered 2019-03-07: 500 mL

## 2019-03-07 MED ORDER — METOPROLOL TARTRATE 12.5 MG HALF TABLET
ORAL_TABLET | ORAL | Status: AC
  Filled 2019-03-07: qty 1

## 2019-03-07 MED ORDER — PHENYLEPHRINE HCL-NACL 10-0.9 MG/250ML-% IV SOLN
INTRAVENOUS | Status: DC | PRN
  Administered 2019-03-07: 30 ug/min via INTRAVENOUS

## 2019-03-07 MED ORDER — PROPOFOL 500 MG/50ML IV EMUL
INTRAVENOUS | Status: DC | PRN
  Administered 2019-03-07: 75 ug/kg/min via INTRAVENOUS

## 2019-03-07 MED ORDER — PAPAVERINE HCL 30 MG/ML IJ SOLN
INTRAMUSCULAR | Status: AC
  Filled 2019-03-07: qty 2

## 2019-03-07 MED ORDER — CHLORHEXIDINE GLUCONATE 4 % EX LIQD: 60.0000 mL | Freq: Once | CUTANEOUS | Status: DC

## 2019-03-07 MED ORDER — HEPARIN SODIUM (PORCINE) 1000 UNIT/ML IJ SOLN
INTRAMUSCULAR | Status: DC | PRN
  Administered 2019-03-07: 3000 [IU] via INTRAVENOUS

## 2019-03-07 MED ORDER — 0.9 % SODIUM CHLORIDE (POUR BTL) OPTIME
TOPICAL | Status: DC | PRN
  Administered 2019-03-07: 14:00:00 1000 mL

## 2019-03-07 MED ORDER — FENTANYL CITRATE (PF) 100 MCG/2ML IJ SOLN
INTRAMUSCULAR | Status: DC | PRN
  Administered 2019-03-07: 50 ug via INTRAVENOUS

## 2019-03-07 MED ORDER — SODIUM CHLORIDE 0.9 % IV SOLN
INTRAVENOUS | Status: DC
  Administered 2019-03-07 (×2): via INTRAVENOUS

## 2019-03-07 MED ORDER — ACETAMINOPHEN 500 MG PO TABS
1000.0000 mg | ORAL_TABLET | Freq: Once | ORAL | Status: AC
  Administered 2019-03-07: 1000 mg via ORAL

## 2019-03-07 MED ORDER — ONDANSETRON HCL 4 MG/2ML IJ SOLN
INTRAMUSCULAR | Status: DC | PRN
  Administered 2019-03-07: 4 mg via INTRAVENOUS

## 2019-03-07 MED ORDER — ONDANSETRON HCL 4 MG/2ML IJ SOLN
INTRAMUSCULAR | Status: AC
  Filled 2019-03-07: qty 2

## 2019-03-07 MED ORDER — LIDOCAINE HCL (PF) 1 % IJ SOLN
INTRAMUSCULAR | Status: AC
  Filled 2019-03-07: qty 30

## 2019-03-07 MED ORDER — PHENYLEPHRINE HCL (PRESSORS) 10 MG/ML IV SOLN
INTRAVENOUS | Status: DC | PRN
  Administered 2019-03-07: 120 ug via INTRAVENOUS
  Administered 2019-03-07: 80 ug via INTRAVENOUS

## 2019-03-07 MED ORDER — PHENYLEPHRINE 40 MCG/ML (10ML) SYRINGE FOR IV PUSH (FOR BLOOD PRESSURE SUPPORT)
PREFILLED_SYRINGE | INTRAVENOUS | Status: AC
  Filled 2019-03-07: qty 10

## 2019-03-07 SURGICAL SUPPLY — 49 items
ADH SKN CLS APL DERMABOND .7 (GAUZE/BANDAGES/DRESSINGS) ×2
ADH SKN CLS LQ APL DERMABOND (GAUZE/BANDAGES/DRESSINGS) ×2
AGENT HMST SPONGE THK3/8 (HEMOSTASIS)
ARMBAND PINK RESTRICT EXTREMIT (MISCELLANEOUS) ×6 IMPLANT
BNDG ELASTIC 4X5.8 VLCR STR LF (GAUZE/BANDAGES/DRESSINGS) ×1 IMPLANT
CANISTER SUCT 3000ML PPV (MISCELLANEOUS) ×3 IMPLANT
CLIP VESOCCLUDE MED 6/CT (CLIP) ×3 IMPLANT
CLIP VESOCCLUDE SM WIDE 6/CT (CLIP) ×3 IMPLANT
COVER PROBE W GEL 5X96 (DRAPES) ×3 IMPLANT
COVER WAND RF STERILE (DRAPES) ×3 IMPLANT
DECANTER SPIKE VIAL GLASS SM (MISCELLANEOUS) ×3 IMPLANT
DERMABOND ADHESIVE PROPEN (GAUZE/BANDAGES/DRESSINGS) ×1
DERMABOND ADVANCED (GAUZE/BANDAGES/DRESSINGS) ×1
DERMABOND ADVANCED .7 DNX12 (GAUZE/BANDAGES/DRESSINGS) ×2 IMPLANT
DERMABOND ADVANCED .7 DNX6 (GAUZE/BANDAGES/DRESSINGS) IMPLANT
DRAPE ORTHO SPLIT 77X108 STRL (DRAPES) ×6
DRAPE SURG ORHT 6 SPLT 77X108 (DRAPES) IMPLANT
ELECT REM PT RETURN 9FT ADLT (ELECTROSURGICAL) ×3
ELECTRODE REM PT RTRN 9FT ADLT (ELECTROSURGICAL) ×2 IMPLANT
GAUZE SPONGE 4X4 12PLY STRL (GAUZE/BANDAGES/DRESSINGS) ×3 IMPLANT
GAUZE SPONGE 4X4 12PLY STRL LF (GAUZE/BANDAGES/DRESSINGS) ×1 IMPLANT
GLOVE BIO SURGEON STRL SZ7.5 (GLOVE) ×5 IMPLANT
GLOVE BIOGEL PI IND STRL 6 (GLOVE) IMPLANT
GLOVE BIOGEL PI IND STRL 6.5 (GLOVE) IMPLANT
GLOVE BIOGEL PI IND STRL 8 (GLOVE) ×2 IMPLANT
GLOVE BIOGEL PI INDICATOR 6 (GLOVE) ×1
GLOVE BIOGEL PI INDICATOR 6.5 (GLOVE) ×2
GLOVE BIOGEL PI INDICATOR 8 (GLOVE) ×2
GOWN STRL REUS W/ TWL LRG LVL3 (GOWN DISPOSABLE) ×4 IMPLANT
GOWN STRL REUS W/ TWL XL LVL3 (GOWN DISPOSABLE) ×4 IMPLANT
GOWN STRL REUS W/TWL LRG LVL3 (GOWN DISPOSABLE) ×6
GOWN STRL REUS W/TWL XL LVL3 (GOWN DISPOSABLE) ×6
HEMOSTAT SPONGE AVITENE ULTRA (HEMOSTASIS) IMPLANT
KIT BASIN OR (CUSTOM PROCEDURE TRAY) ×3 IMPLANT
KIT TURNOVER KIT B (KITS) ×3 IMPLANT
NS IRRIG 1000ML POUR BTL (IV SOLUTION) ×3 IMPLANT
PACK CV ACCESS (CUSTOM PROCEDURE TRAY) ×3 IMPLANT
PAD ARMBOARD 7.5X6 YLW CONV (MISCELLANEOUS) ×6 IMPLANT
SUT ETHILON 3 0 PS 1 (SUTURE) IMPLANT
SUT MNCRL AB 4-0 PS2 18 (SUTURE) ×6 IMPLANT
SUT PROLENE 5 0 C 1 24 (SUTURE) ×2 IMPLANT
SUT PROLENE 6 0 BV (SUTURE) ×3 IMPLANT
SUT PROLENE 7 0 BV 1 (SUTURE) IMPLANT
SUT SILK 0 TIES 10X30 (SUTURE) ×3 IMPLANT
SUT VIC AB 3-0 SH 27 (SUTURE) ×3
SUT VIC AB 3-0 SH 27X BRD (SUTURE) ×2 IMPLANT
TOWEL GREEN STERILE (TOWEL DISPOSABLE) ×3 IMPLANT
UNDERPAD 30X30 (UNDERPADS AND DIAPERS) ×3 IMPLANT
WATER STERILE IRR 1000ML POUR (IV SOLUTION) ×3 IMPLANT

## 2019-03-07 NOTE — Progress Notes (Signed)
I-stat completed by lead CRNA. Na 140 K 3.7 Cl 100 iCa 1.27 TCO2 28 Glucose 78 BUN 22 Creatine 6.2 H/H 15.3/45 AnGap 18 Dr. Roanna Banning is aware.

## 2019-03-07 NOTE — Anesthesia Procedure Notes (Signed)
Procedure Name: MAC Date/Time: 03/07/2019 12:17 PM Performed by: Inda Coke, CRNA Pre-anesthesia Checklist: Patient identified, Emergency Drugs available, Suction available, Timeout performed and Patient being monitored Patient Re-evaluated:Patient Re-evaluated prior to induction Oxygen Delivery Method: Simple face mask Induction Type: IV induction Dental Injury: Teeth and Oropharynx as per pre-operative assessment

## 2019-03-07 NOTE — Anesthesia Postprocedure Evaluation (Signed)
Anesthesia Post Note  Patient: Michael [name].  Procedure(s) Performed: LIGATION OF ARTERIOVENOUS FISTULA  LEFT UPPER ARM (Left ) ARTERIOVENOUS (AV) FISTULA CREATION  RIGHT ARM (Right Arm Lower)     Patient location during evaluation: PACU Anesthesia Type: General Level of consciousness: awake Pain management: pain level controlled Vital Signs Assessment: post-procedure vital signs reviewed and stable Respiratory status: spontaneous breathing, nonlabored ventilation, respiratory function stable and patient connected to nasal cannula oxygen Cardiovascular status: blood pressure returned to baseline and stable Postop Assessment: no apparent nausea or vomiting Anesthetic complications: no    Last Vitals:  Vitals:   03/07/19 1510 03/07/19 1515  BP: (!) 107/94 (!) 107/94  Pulse:    Resp: 20 17  Temp: (!) 36.2 C   SpO2: 98%     Last Pain:  Vitals:   03/07/19 1510  TempSrc:   PainSc: 0-No pain                 Ryan P Ellender

## 2019-03-07 NOTE — Progress Notes (Signed)
Dr. Tobias Alexander ok with elevated potassium. No new orders received.

## 2019-03-07 NOTE — Discharge Instructions (Signed)
° °  [facility] ° °Discharge Instructions ° °AV Fistula or Graft Surgery for Dialysis Access ° °Please refer to the following instructions for your post-procedure care. Your surgeon or physician assistant will discuss any changes with you. ° °Activity ° °You may drive the day following your surgery, if you are comfortable and no longer taking prescription pain medication. Resume full activity as the soreness in your incision resolves. ° °Bathing/Showering ° °You may shower after you go home. Keep your incision dry for 48 hours. Do not soak in a bathtub, hot tub, or swim until the incision heals completely. You may not shower if you have a hemodialysis catheter. ° °Incision Care ° °Clean your incision with mild soap and water after 48 hours. Pat the area dry with a clean towel. You do not need a bandage unless otherwise instructed. Do not apply any ointments or creams to your incision. You may have skin glue on your incision. Do not peel it off. It will come off on its own in about one week. Your arm may swell a bit after surgery. To reduce swelling use pillows to elevate your arm so it is above your heart. Your doctor will tell you if you need to lightly wrap your arm with an ACE bandage. ° °Diet ° °Resume your normal diet. There are not special food restrictions following this procedure. In order to heal from your surgery, it is CRITICAL to get adequate nutrition. Your body requires vitamins, minerals, and protein. Vegetables are the best source of vitamins and minerals. Vegetables also provide the perfect balance of protein. Processed food has little nutritional value, so try to avoid this. ° °Medications ° °Resume taking all of your medications. If your incision is causing pain, you may take over-the counter pain relievers such as acetaminophen (Tylenol). If you were prescribed a stronger pain medication, please be aware these medications can cause nausea and constipation. Prevent  nausea by taking the medication with a snack or meal. Avoid constipation by drinking plenty of fluids and eating foods with high amount of fiber, such as fruits, vegetables, and grains. Do not take Tylenol if you are taking prescription pain medications. ° ° ° ° °Follow up °Your surgeon may want to see you in the office following your access surgery. If so, this will be arranged at the time of your surgery. ° °Please call us immediately for any of the following conditions: ° °Increased pain, redness, drainage (pus) from your incision site °Fever of 101 degrees or higher °Severe or worsening pain at your incision site °Hand pain or numbness. ° °Reduce your risk of vascular disease: ° °Stop smoking. If you would like help, call [facility] at 1-800-QUIT-NOW (1-800-784-8669) or [facility] at 336-586-4000 ° °Manage your cholesterol °Maintain a desired weight °Control your diabetes °Keep your blood pressure down ° °Dialysis ° °It will take several weeks to several months for your new dialysis access to be ready for use. Your surgeon will determine when it is OK to use it. Your nephrologist will continue to direct your dialysis. You can continue to use your Permcath until your new access is ready for use. ° °If you have any questions, please call the office at [phone]. ° °

## 2019-03-07 NOTE — Op Note (Signed)
OPERATIVE NOTE   PROCEDURE: 1. Ligation of left arm brachiobasilic arteriovenous fistula 2. Right first stage basilic vein transposition (brachiobasilic arteriovenous fistula) placement  PRE-OPERATIVE DIAGNOSIS: ESRD  POST-OPERATIVE DIAGNOSIS: same  SURGEON: Marty Heck, MD  ASSISTANT(S): Arlee Muslim, PA  ANESTHESIA: MAC converted to LMA  ESTIMATED BLOOD LOSS: minimal  FINDING(S): 1. Ligation of left brachiobasilic fistula just above anastomosis and evacuation of thrombus in aneurysm with thinning skin 2.  Basilic vein right arm: 2.5 mm, borderline quality 3.  Brachial artery right arm: 3.5 mm, disease free 4.  Venous outflow: faintly palpable thrill  5.  Radial flow: dopplerable radial signal  SPECIMEN(S):  none  INDICATIONS:   Michael [name]. is a 63 y.o. male who presents with nonsalvageable brachiobasilic fistula in the left arm.  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  We proposed ligating the left arm AV fistula.  He also needs new AV fistula placement in the right arm.  Discussed evaluating for brachiobasilic versus graft in the right arm.  The patient is aware the risks include but are not limited to: bleeding, infection, steal syndrome, nerve damage, ischemic monomelic neuropathy, failure to mature, and need for additional procedures.  The patient is aware of the risks of the procedure and elects to proceed forward.   DESCRIPTION: After full informed written consent was obtained from the patient, the patient was brought back to the operating room and placed supine upon the operating table.  Prior to induction, the patient received IV antibiotics.     After obtaining adequate anesthesia, the left arm was initially prepped and draped sterile fashion.  We prepped his brachiobasilic fistula into the field including the large aneurysm with thinning  skin.  Initially made a transverse incision over the vein just beyond the arterial anastomosis after injecting local anesthesia.  Dissected down with Bovie cautery and blunt dissection with Metzenbaum scissors and got a right angle clamp around the fistula.  Ultimately fistula clamps were used to control proximally distally where we had mobilized the fistula.  This was transected in half.  I then oversewed each end.  Initially oversewed the arterial side with a 5-0 Prolene in running fashion.  I then decompressed the aneurysm in the upper arm and then oversewed the distal segment as well with a running 5-0 Prolene.  The wound was copiously irrigated.  Subcutaneous tissue was closed with 3-0 Vicryl.  Skin was closed with 4-0 Monocryl.  Dermabond was applied.  The right arm was then prepped and draped in the standard fashion for a right arm access procedure.  I turned my attention first to identifying the patient's basilic vein and brachial artery.  Using SonoSite guidance, the location of these vessels were marked out on the skin.   At this point, I injected local anesthetic to obtain a field block of the antecubitum.  In total, I injected about 5 mL of 1% lidocaine without epinephrine.  I made a transverse incision at the level of the antecubitum and dissected through the subcutaneous tissue and fascia to gain exposure of the brachial artery.  This was noted to be 3.5 mm in diameter externally.  This was dissected out proximally and distally and controlled with vessel loops .  I then dissected out the basilic vein.  This was noted to be 2.5 mm in diameter externally.  The distal segment of the vein  was ligated with a  2-0 silk, and the vein was transected.  The proximal segment was interrogated with serial dilators.  The vein accepted up to a 3 mm dilator without any difficulty.  I then instilled the heparinized saline into the vein and clamped it.  At this point, I reset my exposure of the brachial artery and  placed the artery under tension proximally and distally after giving 3000 units IV heparin.  I made an arteriotomy with a #11 blade, and then I extended the arteriotomy with a Potts scissor.  I injected heparinized saline proximal and distal to this arteriotomy.  The vein was then sewn to the artery in an end-to-side configuration with a running stitch of 6-0 Prolene.  Prior to completing this anastomosis, I allowed the vein and artery to backbleed.  There was no evidence of clot from any vessels.  I completed the anastomosis in the usual fashion and then released all vessel loops and clamps.    There was a faintly palpable thrill in the venous outflow, and there was a dopplerable radial signal.  At this point, I irrigated out the surgical wound.  I mobilized more of the basilic vein and found several side branches to ligate between 3-0 silk ties and these branches were divided to facilitate maturation of the fistula.  There was no further active bleeding.  The subcutaneous tissue was reapproximated with a running stitch of 3-0 Vicryl.  The skin was then reapproximated with a running subcuticular stitch of 4-0 Monocryl.  The skin was then cleaned, dried, and reinforced with Dermabond.  The patient tolerated this procedure well.    COMPLICATIONS: None  CONDITION: Stable  Marty Heck MD [facility] Office: [phone] Pager: [phone]  03/07/2019, 2:04 PM

## 2019-03-07 NOTE — Anesthesia Procedure Notes (Signed)
Procedure Name: LMA Insertion Date/Time: 03/07/2019 1:26 PM Performed by: Larene Beach, CRNA Pre-anesthesia Checklist: Patient identified, Emergency Drugs available, Suction available and Patient being monitored Patient Re-evaluated:Patient Re-evaluated prior to induction Oxygen Delivery Method: Circle system utilized Preoxygenation: Pre-oxygenation with 100% oxygen Induction Type: IV induction LMA: LMA inserted LMA Size: 4.0 Number of attempts: 1 Placement Confirmation: positive ETCO2,  breath sounds checked- equal and bilateral and CO2 detector Tube secured with: Tape Dental Injury: Teeth and Oropharynx as per pre-operative assessment

## 2019-03-07 NOTE — H&P (Signed)
History and Physical Interval Note:  03/07/2019 11:45 AM  Michael [name].  has presented today for surgery, with the diagnosis of END STAGE RENAL DISEASE.  The various methods of treatment have been discussed with the patient and family. After consideration of risks, benefits and other options for treatment, the patient has consented to  Procedure(s): LIGATION OF ARTERIOVENOUS  FISTULA LEFT ARM (Left) ARTERIOVENOUS (AV) FISTULA CREATION VS ARTERIOVENOUS GRAFT RIGHT ARM (Right) as a surgical intervention.  The patient's history has been reviewed, patient examined, no change in status, stable for surgery.  I have reviewed the patient's chart and labs.  Questions were answered to the patient's satisfaction.    Ligate left arm AVF - not salvageable with large aneurysm and thinning skin.  New access in right arm AVF vs graft.  Michael [name]  Patient name:Michael JohnsonMRN: [id number] DOB: 09-21-1957Sex: male  REASON FOR VISIT:Evaluate left arm AVF access  HPI: Michael [name] a 63 y.o.malewith multiple medical problems including end-stage renal disease that hasdialysison Monday Wednesday Friday thatpresents for evaluation of his left arm access. Patient initially had a brachiocephalic fistula that ultimately was ligated and excised 12/27/2016. Most recently had a brachiobasilic fistula in the left arm placed on 01/31/2017 by Dr. Bridgett Larsson. He states this worked well for several years. He started having trouble withthe access back in August. [facility] tunneledcatheter placed and he was told that his fistula was likely no longer usable. He has a large aneurysm over the fistula with some ulcerated segments of skin as well. He's hadno previous right arm access.      Past Medical History:  Diagnosis Date  . Cataract   . Dyspnea    on exertion  . ESRD (end stage renal disease) ([place])    Hemo- MWF,  Polycystic kidney disease  . Fatigue   . History of kidney stones    removal of stone- cysto  . Hyperlipidemia   . Hyperparathyroidism, secondary renal ([place])   . Hypertension   . Hypoxemia 12/12/2013  . Nonischemic cardiomyopathy ([place])    Er 25% 2015, 55 % 2013  . OSA on CPAP    no longer using cpap  . OSA on CPAP 03/24/2014  . Pneumonia    2015ish  . Ventricular tachycardia//Freq PVCs          Past Surgical History:  Procedure Laterality Date  . A/V FISTULAGRAM Left 04/27/2017   Procedure: A/V FISTULAGRAM; Surgeon: Conrad Magnolia, MD; Location: [facility]; Service: Cardiovascular; Laterality: Left; lt arm  . APPENDECTOMY    . AV FISTULA PLACEMENT  12/05/2011   Procedure: ARTERIOVENOUS (AV) FISTULA CREATION;LLEFT ARM Surgeon: Conrad [place], MD; Location: [place]; Service: Vascular; Laterality: Left; RADIO-CEPHALIC fistula left arm  . AV FISTULA PLACEMENT  01/11/2012   Procedure: ARTERIOVENOUS (AV) FISTULA CREATION; Surgeon: Conrad [place], MD; Location: [place]; Service: Vascular; Laterality: Left; Creation of left brachial cephalic arteriovenous fistula  . BASCILIC VEIN TRANSPOSITION Left 12/27/2016   Procedure: BASILIC VEIN TRANSPOSITION LEFT UPPER ARM FIRST STAGE; Surgeon: Conrad V[place], MD; Location: [place]; Service: Vascular; Laterality: Left;  . BASCILIC VEIN TRANSPOSITION Left 01/31/2017   Procedure: LEFT ARM BASILIC VEIN TRANSPOSITION, SECOND STAGE; Surgeon: Conrad Shannon, MD; Location: [place]; Service: Vascular; Laterality: Left;  . CARDIAC CATHETERIZATION  04-05-2010   checking for blockage but none-[facility]  . COLONOSCOPY    . CYSTOSCOPY W/ STONE MANIPULATION     "laser once" (01/22/2013)  .  HEMATOMA EVACUATION Left 05/09/2017   Procedure: EVACUATION HEMATOMA LEFT ARM; Surgeon: Conrad [place]wood, MD; Location: [place]; Service: Vascular; Laterality: Left;  . HERNIA REPAIR    . INGUINAL HERNIA REPAIR Right 11/06/2015    Procedure: OPEN HERNIA REPAIR RIGHT INGUINAL ADULT; Surgeon: [name], MD; Location: [facility]; Service: General; Laterality: Right; with MESH  . INSERTION OF DIALYSIS CATHETER Right 10/05/2016   Procedure: INSERTION OF right internal jugular DIALYSIS CATHETER; Surgeon: [name], MD; Location: [place]; Service: Vascular; Laterality: Right;  . INSERTION OF MESH  03/20/2012   Procedure: INSERTION OF MESH; [facility] Surgeon: [name], MD; Location: [place]; Service: General; Laterality: N/A;  . INSERTION OF MESH N/A 01/22/2013   Procedure: INSERTION OF MESH; Surgeon: [name], MD; Location: [place]; Service: General; Laterality: N/A;  . LAPAROTOMY  04/02/2012   Procedure: EXPLORATORY LAPAROTOMY; Surgeon: [name], MD; Location: [place]; Service: General; Laterality: N/A; Exploratory Laparotomy with resection of small intestine  . LEFT HEART CATHETERIZATION WITH CORONARY ANGIOGRAM N/A 05/14/2013   Procedure: LEFT HEART CATHETERIZATION WITH CORONARY ANGIOGRAM; Surgeon: Sinclair Grooms, MD; [facility]; Service: Cardiovascular; Laterality: N/A;  . LIGATION OF ARTERIOVENOUS FISTULA Left 12/27/2016   Procedure: LIGATION/EXCISION OF LEFT UPPER ARM ARTERIOVENOUS FISTULA; EVACUATION OF HEMATOMA; Surgeon: Conrad Esko, MD; Location: [place]; Service: Vascular; Laterality: Left;  . REVISON OF ARTERIOVENOUS FISTULA Left 10/05/2016   Procedure: REVISON OF left arm ARTERIOVENOUS FISTULA; Surgeon: [name], MD; Location: [place]; Service: Vascular; Laterality: Left;  . TONSILLECTOMY    . UMBILICAL HERNIA REPAIR  03/20/2012   Procedure: HERNIA REPAIR UMBILICAL ADULT; Surgeon: [name], MD; Location: [place]; Service: General; Laterality: N/A;  . UMBILICAL HERNIA REPAIR  01/22/2013   preperitoneal open procedure due to significant adhesions/notes 01/22/2013  . VENTRAL HERNIA REPAIR N/A 01/22/2013   Procedure: ATTEMPTED  LAPAROSCOPIC VENTRAL HERNIA CONVERTED TO OPEN; Surgeon: [name], MD; [facility]: [facility]; Service: General; Laterality: N/A;         Family History  Problem Relation Age of Onset  . Heart disease Mother   . Hyperlipidemia Mother   . Hypertension Mother   . Kidney disease Father   . Stroke Father   . Kidney disease Brother   . Amblyopia Neg Hx   . Blindness Neg Hx   . Cataracts Neg Hx   . Diabetes Neg Hx   . Glaucoma Neg Hx   . Macular degeneration Neg Hx   . Retinal detachment Neg Hx   . Strabismus Neg Hx   . Retinitis pigmentosa Neg Hx     SOCIAL HISTORY: Social History        Tobacco Use  . Smoking status: Never Smoker  . Smokeless tobacco: Never Used  Substance Use Topics  . Alcohol use: No    Alcohol/week: 0.0 standard drinks    No Known Allergies        Current Outpatient Medications  Medication Sig Dispense Refill  . acyclovir (ZOVIRAX) 400 MG tablet     . aspirin EC 81 MG EC tablet Take 1 tablet (81 mg total) by mouth daily.    [name] atorvastatin (LIPITOR) 40 MG tablet Take 40 mg by mouth every evening.     . calcium acetate (PHOSLO) 667 MG capsule Take 1,334 mg by mouth 3 (three) times daily with meals.   6  . dorzolamide-timolol (COSOPT) 22.3-6.8 MG/ML ophthalmic solution Place 1 drop into both eyes 2 (two) times daily. (Patient not taking: Reported on 01/08/2019)  10 mL 3  . Ganciclovir (ZIRGAN) 0.15 % GEL Place 1 drop into the right eye 5 (five) times daily. (Patient not taking: Reported on 01/08/2019) 5 g 1  . metoprolol succinate (TOPROL-XL) 100 MG 24 hr tablet Take 50 mg by mouth See admin instructions. Take 50 mg by mouth daily on Tuesday, Thursday and Saturday.    . nitroGLYCERIN (NITROSTAT) 0.4 MG SL tablet Place 1 tablet (0.4 mg total) under the tongue every 5 (five) minutes x 3 doses as needed for chest pain. (Patient not taking: Reported on 01/08/2019) 30 tablet 12  . prednisoLONE acetate (PRED FORTE) 1 %  ophthalmic suspension Place 1 drop into the right eye 2 (two) times daily. (Patient not taking: Reported on 01/08/2019) 15 mL 0  . RENVELA 800 MG tablet     . SENSIPAR 60 MG tablet Take 60 mg by mouth every evening.   5            Current Facility-Administered Medications  Medication Dose Route Frequency Provider Last Rate Last Dose  . Bevacizumab (AVASTIN) SOLN 1.25 mg 1.25 mg Intravitreal  [name], MD  1.25 mg at 04/21/17 1028  . Bevacizumab (AVASTIN) SOLN 1.25 mg 1.25 mg Intravitreal  [name], MD  1.25 mg at 05/19/17 1117  . Bevacizumab (AVASTIN) SOLN 1.25 mg 1.25 mg Intravitreal  [name], MD  1.25 mg at 06/16/17 1124  . Bevacizumab (AVASTIN) SOLN 1.25 mg 1.25 mg Intravitreal  [name], MD  1.25 mg at 08/01/17 0842  . Bevacizumab (AVASTIN) SOLN 1.25 mg 1.25 mg Intravitreal  [name], MD  1.25 mg at 08/31/17 2203    REVIEW OF SYSTEMS: [X]  denotes positive finding, [ ]  denotes negative finding Cardiac  Comments:  Chest pain or chest pressure:    Shortness of breath upon exertion:    Short of breath when lying flat:    Irregular heart rhythm:        Vascular    Pain in calf, thigh, or hip brought on by ambulation:    Pain in feet at night that wakes you up from your sleep:     Blood clot in your veins:    Leg swelling:         Pulmonary    Oxygen at home:    Productive cough:     Wheezing:         Neurologic    Sudden weakness in arms or legs:     Sudden numbness in arms or legs:     Sudden onset of difficulty speaking or slurred speech:    Temporary loss of vision in one eye:     Problems with dizziness:         Gastrointestinal    Blood in stool:     Vomited blood:         Genitourinary    Burning when urinating:     Blood in urine:        Psychiatric    Major depression:         Hematologic    Bleeding problems:    Problems  with blood clotting too easily:        Skin    Rashes or ulcers:        Constitutional    Fever or chills:      PHYSICAL EXAM:    Vitals:   01/08/19 0826  BP: 117/82  Pulse: 95  Resp: 16  Temp: 97.8 F (36.6 C)  TempSrc: Temporal  SpO2: 100%  Weight: 180 lb (81.6 kg)  Height: 5' 7.5" (1.715 m)    GENERAL:The patient is a well-nourished male, in no acute distress. The vital signs are documented above. CARDIAC:There is a regular rate and rhythm.  VASCULAR: Left radial and brachial pulse palpable Incision from ligated and incised left brachicephalic AVF Left brachiobasilic AVF pulsatile, no thrill, large aneurysm, two ulcerated skin segments  DATA:  None  Assessment/Plan:  63 year old male with end-stage renal disease on dialysis MondayWednesdayFriday thatpresents for evaluation of left arm brachiobasilic fistula. As noted above he has large aneurysm in the mid upper arm with several ulcerated segments andthe fistula itself is pulsatile. In reviewing notes from [facility] it appearsthey felt there was too much thrombus burden inthe fistula anditwas not salvageable. He had a RIJ tunneled cathter placed by [facility].Discussed plan for left arm fistulogram to evaluate options that could include aneurysm plication and removal of some of the thrombus and resection of the ulcerated skin if it looks salvageable. He may ultimately require left arm graft if it is not salvageable and/orright arm access. Discussed that this may not be salvageable but would certainly need better imaging to make that determination. We will arrange for Thursday which is a nondialysis day.   Michael Heck, MD [facility] Office: [phone] Pager: [phone]

## 2019-03-07 NOTE — Transfer of Care (Signed)
Immediate Anesthesia Transfer of Care Note  Patient: Michael [name].  Procedure(s) Performed: LIGATION OF ARTERIOVENOUS FISTULA  LEFT UPPER ARM (Left ) ARTERIOVENOUS (AV) FISTULA CREATION  RIGHT ARM (Right Arm Lower)  Patient Location: PACU  Anesthesia Type:General  Level of Consciousness: drowsy and patient cooperative  Airway & Oxygen Therapy: Patient Spontanous Breathing and Patient connected to face mask oxygen  Post-op Assessment: Report given to RN, Post -op Vital signs reviewed and stable and Patient moving all extremities X 4  Post vital signs: Reviewed and stable  Last Vitals:  Vitals Value Taken Time  BP 113/58 03/07/19 1426  Temp 36.2 C 03/07/19 1426  Pulse 82 03/07/19 1426  Resp 17 03/07/19 1433  SpO2 100 % 03/07/19 1426  Vitals shown include unvalidated device data.  Last Pain:  Vitals:   03/07/19 1426  TempSrc:   PainSc: Asleep         Complications: No apparent anesthesia complications

## 2019-03-08 ENCOUNTER — Encounter (HOSPITAL_COMMUNITY): Payer: Self-pay | Admitting: Vascular Surgery

## 2019-03-09 DIAGNOSIS — N186 End stage renal disease: Secondary | ICD-10-CM | POA: Diagnosis not present

## 2019-03-09 DIAGNOSIS — Z992 Dependence on renal dialysis: Secondary | ICD-10-CM | POA: Diagnosis not present

## 2019-03-09 DIAGNOSIS — N2581 Secondary hyperparathyroidism of renal origin: Secondary | ICD-10-CM | POA: Diagnosis not present

## 2019-03-11 DIAGNOSIS — N186 End stage renal disease: Secondary | ICD-10-CM | POA: Diagnosis not present

## 2019-03-11 DIAGNOSIS — N2581 Secondary hyperparathyroidism of renal origin: Secondary | ICD-10-CM | POA: Diagnosis not present

## 2019-03-11 DIAGNOSIS — Z992 Dependence on renal dialysis: Secondary | ICD-10-CM | POA: Diagnosis not present

## 2019-03-12 ENCOUNTER — Other Ambulatory Visit (INDEPENDENT_AMBULATORY_CARE_PROVIDER_SITE_OTHER): Payer: Self-pay | Admitting: Ophthalmology

## 2019-03-13 DIAGNOSIS — Z992 Dependence on renal dialysis: Secondary | ICD-10-CM | POA: Diagnosis not present

## 2019-03-13 DIAGNOSIS — N186 End stage renal disease: Secondary | ICD-10-CM | POA: Diagnosis not present

## 2019-03-13 DIAGNOSIS — N2581 Secondary hyperparathyroidism of renal origin: Secondary | ICD-10-CM | POA: Diagnosis not present

## 2019-03-15 DIAGNOSIS — Z992 Dependence on renal dialysis: Secondary | ICD-10-CM | POA: Diagnosis not present

## 2019-03-15 DIAGNOSIS — N2581 Secondary hyperparathyroidism of renal origin: Secondary | ICD-10-CM | POA: Diagnosis not present

## 2019-03-15 DIAGNOSIS — N186 End stage renal disease: Secondary | ICD-10-CM | POA: Diagnosis not present

## 2019-03-18 DIAGNOSIS — N2581 Secondary hyperparathyroidism of renal origin: Secondary | ICD-10-CM | POA: Diagnosis not present

## 2019-03-18 DIAGNOSIS — Z992 Dependence on renal dialysis: Secondary | ICD-10-CM | POA: Diagnosis not present

## 2019-03-18 DIAGNOSIS — N186 End stage renal disease: Secondary | ICD-10-CM | POA: Diagnosis not present

## 2019-03-20 DIAGNOSIS — N2581 Secondary hyperparathyroidism of renal origin: Secondary | ICD-10-CM | POA: Diagnosis not present

## 2019-03-20 DIAGNOSIS — Z992 Dependence on renal dialysis: Secondary | ICD-10-CM | POA: Diagnosis not present

## 2019-03-20 DIAGNOSIS — N186 End stage renal disease: Secondary | ICD-10-CM | POA: Diagnosis not present

## 2019-03-21 ENCOUNTER — Encounter (INDEPENDENT_AMBULATORY_CARE_PROVIDER_SITE_OTHER): Payer: Medicare Other | Admitting: Ophthalmology

## 2019-03-22 DIAGNOSIS — N2581 Secondary hyperparathyroidism of renal origin: Secondary | ICD-10-CM | POA: Diagnosis not present

## 2019-03-22 DIAGNOSIS — N186 End stage renal disease: Secondary | ICD-10-CM | POA: Diagnosis not present

## 2019-03-22 DIAGNOSIS — Z992 Dependence on renal dialysis: Secondary | ICD-10-CM | POA: Diagnosis not present

## 2019-03-24 DIAGNOSIS — N186 End stage renal disease: Secondary | ICD-10-CM | POA: Diagnosis not present

## 2019-03-24 DIAGNOSIS — N2581 Secondary hyperparathyroidism of renal origin: Secondary | ICD-10-CM | POA: Diagnosis not present

## 2019-03-24 DIAGNOSIS — Z992 Dependence on renal dialysis: Secondary | ICD-10-CM | POA: Diagnosis not present

## 2019-03-25 ENCOUNTER — Encounter (INDEPENDENT_AMBULATORY_CARE_PROVIDER_SITE_OTHER): Payer: Self-pay | Admitting: Ophthalmology

## 2019-03-25 ENCOUNTER — Ambulatory Visit (INDEPENDENT_AMBULATORY_CARE_PROVIDER_SITE_OTHER): Payer: Medicare Other | Admitting: Ophthalmology

## 2019-03-25 DIAGNOSIS — H35412 Lattice degeneration of retina, left eye: Secondary | ICD-10-CM

## 2019-03-25 DIAGNOSIS — H35033 Hypertensive retinopathy, bilateral: Secondary | ICD-10-CM

## 2019-03-25 DIAGNOSIS — H2000 Unspecified acute and subacute iridocyclitis: Secondary | ICD-10-CM

## 2019-03-25 DIAGNOSIS — H3581 Retinal edema: Secondary | ICD-10-CM | POA: Diagnosis not present

## 2019-03-25 DIAGNOSIS — H25813 Combined forms of age-related cataract, bilateral: Secondary | ICD-10-CM

## 2019-03-25 DIAGNOSIS — H43813 Vitreous degeneration, bilateral: Secondary | ICD-10-CM

## 2019-03-25 DIAGNOSIS — H209 Unspecified iridocyclitis: Secondary | ICD-10-CM | POA: Diagnosis not present

## 2019-03-25 DIAGNOSIS — H34832 Tributary (branch) retinal vein occlusion, left eye, with macular edema: Secondary | ICD-10-CM | POA: Diagnosis not present

## 2019-03-25 DIAGNOSIS — H34831 Tributary (branch) retinal vein occlusion, right eye, with macular edema: Secondary | ICD-10-CM | POA: Diagnosis not present

## 2019-03-25 DIAGNOSIS — H33332 Multiple defects of retina without detachment, left eye: Secondary | ICD-10-CM | POA: Diagnosis not present

## 2019-03-25 DIAGNOSIS — B0052 Herpesviral keratitis: Secondary | ICD-10-CM

## 2019-03-25 MED ORDER — BEVACIZUMAB CHEMO INJECTION 1.25MG/0.05ML SYRINGE FOR KALEIDOSCOPE
1.2500 mg | INTRAVITREAL | Status: AC | PRN
  Administered 2019-03-25: 23:00:00 1.25 mg via INTRAVITREAL

## 2019-03-25 NOTE — Progress Notes (Signed)
[facility] Note  03/25/2019     CHIEF COMPLAINT Patient presents for Retina Follow Up   HISTORY OF PRESENT ILLNESS: Michael [name]. is a 63 y.o. male who presents to the clinic today for:   HPI    Retina Follow Up    Patient presents with  CRVO/BRVO.  In left eye.  Severity is moderate.  Duration of 4.5 weeks.  Since onset it is stable.  I, the attending physician,  performed the HPI with the patient and updated documentation appropriately.          Comments    Patient states vision the same OU.       Last edited by [name], MD on 03/25/2019 10:47 PM. (History)    pt recently had sx to block off the fistula in his left arm and put one in his right arm  Referring physician: Monna Fam, [place],  [place] [id number]  HISTORICAL INFORMATION:   Selected notes from the [facility] Referred by Dr. [name]. Hecker for concern of HRVO OD; LEE- 11.30.18 ([name]) {BCVA OD: 20/100-1 OS: 20/20-2] Ocular Hx- cataract OU PMH- HTN, high chol, kidney disease, sleep apnea, emphysema    CURRENT MEDICATIONS: Current Outpatient Medications (Ophthalmic Drugs)  Medication Sig  . dorzolamide-timolol (COSOPT) 22.3-6.8 MG/ML ophthalmic solution Place 1 drop into both eyes 2 (two) times daily.  . prednisoLONE acetate (PRED FORTE) 1 % ophthalmic suspension Place 1 drop into the right eye daily.    No current facility-administered medications for this visit.  (Ophthalmic Drugs)   Current Outpatient Medications (Other)  Medication Sig  . acyclovir (ZOVIRAX) 400 MG tablet Take 400 mg by mouth 2 (two) times daily.   [name] aspirin EC 81 MG EC tablet Take 1 tablet (81 mg total) by mouth daily.  [name] atorvastatin (LIPITOR) 40 MG tablet Take 40 mg by mouth every evening.   [name] HYDROcodone-acetaminophen (NORCO) 5-325 MG tablet Take 1 tablet by mouth every 6 (six) hours as needed for moderate pain.  . nitroGLYCERIN (NITROSTAT) 0.4 MG SL  tablet Place 1 tablet (0.4 mg total) under the tongue every 5 (five) minutes x 3 doses as needed for chest pain.  . sevelamer carbonate (RENVELA) 800 MG tablet Take 1,600 mg by mouth 3 (three) times daily with meals.  . metoprolol succinate (TOPROL XL) 25 MG 24 hr tablet Take 0.5 tablets (12.5 mg total) by mouth daily. (Patient not taking: Reported on 03/07/2019)   No current facility-administered medications for this visit.  (Other)      REVIEW OF SYSTEMS: ROS    Positive for: Genitourinary, Endocrine, Eyes   Negative for: Constitutional, Gastrointestinal, Neurological, Skin, Musculoskeletal, HENT, Cardiovascular, Respiratory, Psychiatric, Allergic/Imm, Heme/Lymph   Last edited by Roselee Nova D, COT on 03/25/2019  1:52 PM. (History)       ALLERGIES No Known Allergies  PAST MEDICAL HISTORY Past Medical History:  Diagnosis Date  . Atrial arrhythmia    atrial tachycardia with variable AV conduction versus atypical aflutter 01/10/19, rate control (02/06/19)  . Cataract   . Dyspnea    on exertion  . ESRD (end stage renal disease) ([place])    Hemo- MWF, Polycystic kidney disease  . Fatigue   . History of kidney stones    removal of stone- cysto  . Hyperlipidemia   . Hyperparathyroidism, secondary renal ([place])   . Hypertension   . Hypoxemia 12/12/2013  . Nonischemic cardiomyopathy ([place])    Er  25% 2015, 55 % 2013  . OSA on CPAP    no longer using cpap  . OSA on CPAP 03/24/2014  . Pneumonia    2015ish  . Ventricular tachycardia//Freq PVCs   . Wears glasses    Past Surgical History:  Procedure Laterality Date  . A/V FISTULAGRAM Left 04/27/2017   Procedure: A/V FISTULAGRAM;  Surgeon: Conrad Stella, MD;  Location: [facility];  Service: Cardiovascular;  Laterality: Left;  lt arm  . A/V FISTULAGRAM Left 01/10/2019   Procedure: A/V FISTULAGRAM;  Surgeon: Marty Heck, MD;  Location: [facility];  Service: Cardiovascular;  Laterality: Left;  . APPENDECTOMY    .  AV FISTULA PLACEMENT  12/05/2011   Procedure: ARTERIOVENOUS (AV) FISTULA CREATION;LLEFT ARM  Surgeon: Conrad [place], MD;  Location: [facility];  Service: Vascular;  Laterality: Left;  RADIO-CEPHALIC  fistula left arm  . AV FISTULA PLACEMENT  01/11/2012   Procedure: ARTERIOVENOUS (AV) FISTULA CREATION;  Surgeon: Conrad [place], MD;  Location: [facility];  Service: Vascular;  Laterality: Left;  Creation of left brachial cephalic arteriovenous fistula  . AV FISTULA PLACEMENT Right 03/07/2019   Procedure: ARTERIOVENOUS (AV) FISTULA CREATION  RIGHT ARM;  Surgeon: Marty Heck, MD;  Location: [facility];  Service: Vascular;  Laterality: Right;  . BASCILIC VEIN TRANSPOSITION Left 12/27/2016   Procedure: BASILIC VEIN TRANSPOSITION LEFT UPPER ARM FIRST STAGE;  Surgeon: Conrad [place]ton, MD;  Location: [facility];  Service: Vascular;  Laterality: Left;  . BASCILIC VEIN TRANSPOSITION Left 01/31/2017   Procedure: LEFT ARM BASILIC VEIN TRANSPOSITION, SECOND STAGE;  Surgeon: Conrad Takilma, MD;  Location: [facility];  Service: Vascular;  Laterality: Left;  . CARDIAC CATHETERIZATION  04-05-2010   checking for blockage but none-[facility]  . COLONOSCOPY    . CYSTOSCOPY W/ STONE MANIPULATION     "laser once" (01/22/2013)  . HEMATOMA EVACUATION Left 05/09/2017   Procedure: EVACUATION HEMATOMA LEFT ARM;  Surgeon: Conrad [place], MD;  Location: [facility];  Service: Vascular;  Laterality: Left;  . HERNIA REPAIR    . INGUINAL HERNIA REPAIR Right 11/06/2015   Procedure: OPEN HERNIA REPAIR  RIGHT INGUINAL ADULT;  Surgeon: [name], MD;  Location: [facility];  Service: General;  Laterality: Right;  with MESH  . INSERTION OF DIALYSIS CATHETER Right 10/05/2016   Procedure: INSERTION OF right internal jugular DIALYSIS CATHETER;  Surgeon: [name], MD;  Location: [facility];  Service: Vascular;  Laterality: Right;  . INSERTION OF MESH  03/20/2012   Procedure: INSERTION OF MESH;  UMB Surgeon: [name], MD;  Location: [facility];  Service: General;  Laterality:  N/A;  . INSERTION OF MESH N/A 01/22/2013   Procedure: INSERTION OF MESH;  Surgeon: [name], MD;  Location: [facility];  Service: General;  Laterality: N/A;  . LAPAROTOMY  04/02/2012   Procedure: EXPLORATORY LAPAROTOMY;  Surgeon: [name], MD;  Location: [place] [facility];  Service: General;  Laterality: N/A;  Exploratory Laparotomy with resection of small intestine  . LEFT HEART CATHETERIZATION WITH CORONARY ANGIOGRAM N/A 05/14/2013   Procedure: LEFT HEART CATHETERIZATION WITH CORONARY ANGIOGRAM;  Surgeon: Sinclair Grooms, MD;  Location: [facility];  Service: Cardiovascular;  Laterality: N/A;  . LIGATION OF ARTERIOVENOUS  FISTULA Left 12/27/2016   Procedure: LIGATION/EXCISION OF LEFT UPPER ARM ARTERIOVENOUS  FISTULA;  EVACUATION OF HEMATOMA;  Surgeon: Conrad Hollywood, MD;  Location: [facility];  Service: Vascular;  Laterality: Left;  . LIGATION OF ARTERIOVENOUS  FISTULA Left 03/07/2019   Procedure: LIGATION OF ARTERIOVENOUS FISTULA  LEFT UPPER ARM;  Surgeon: Marty Heck, MD;  Location: [facility];  Service: Vascular;  Laterality: Left;  . REVISON OF ARTERIOVENOUS FISTULA Left 10/05/2016   Procedure: REVISON OF left arm ARTERIOVENOUS FISTULA;  Surgeon: [name], MD;  Location: [facility];  Service: Vascular;  Laterality: Left;  . TONSILLECTOMY    . UMBILICAL HERNIA REPAIR  03/20/2012   Procedure: HERNIA REPAIR UMBILICAL ADULT;  Surgeon: [name], MD;  Location: [facility];  Service: General;  Laterality: N/A;  . UMBILICAL HERNIA REPAIR  01/22/2013   preperitoneal open procedure due to significant adhesions/notes 01/22/2013  . VENTRAL HERNIA REPAIR N/A 01/22/2013   Procedure: ATTEMPTED LAPAROSCOPIC VENTRAL HERNIA CONVERTED TO OPEN;  Surgeon: [name], MD;  Location[facility];  Service: General;  Laterality: N/A;    FAMILY HISTORY Family History  Problem Relation Age of Onset  . Heart disease Mother   . Hyperlipidemia Mother   . Hypertension Mother   . Kidney disease Father   . Stroke  Father   . Kidney disease Brother   . Amblyopia Neg Hx   . Blindness Neg Hx   . Cataracts Neg Hx   . Diabetes Neg Hx   . Glaucoma Neg Hx   . Macular degeneration Neg Hx   . Retinal detachment Neg Hx   . Strabismus Neg Hx   . Retinitis pigmentosa Neg Hx     SOCIAL HISTORY Social History   Tobacco Use  . Smoking status: Never Smoker  . Smokeless tobacco: Never Used  Substance Use Topics  . Alcohol use: No    Alcohol/week: 0.0 standard drinks  . Drug use: No         OPHTHALMIC EXAM:  Base Eye Exam    Visual Acuity (Snellen - Linear)      Right Left   Dist cc 20/30 -2 20/30 -1   Dist ph cc 20/20 -2 NI   Correction: Glasses       Tonometry (Tonopen, 1:56 PM)      Right Left   Pressure 20 17       Pupils      Dark Light Shape React APD   Right 4 3 Round Brisk None   Left 4 3 Round Brisk None       Visual Fields (Counting fingers)      Left Right    Full Full       Extraocular Movement      Right Left    Full, Ortho Full, Ortho       Neuro/Psych    Oriented x3: Yes   Mood/Affect: Normal       Dilation    Both eyes: 1.0% Mydriacyl, 2.5% Phenylephrine @ 1:57 PM        Slit Lamp and Fundus Exam    Slit Lamp Exam      Right Left   Lids/Lashes Dermatochalasis - upper lid Dermatochalasis - upper lid   Conjunctiva/Sclera Mild Melanosis, Nasal Pinguecula Mild Melanosis, nasal and temporal Pinguecula   Cornea Arcus, 1+ Punctate epithelial erosions Arcus, 1+ Punctate epithelial erosions   Anterior Chamber Deep and quiet Deep and quiet   Iris Round and dilated Round and dilated   Lens 2+ Nuclear sclerosis, 2+ Cortical cataract 2+ Nuclear sclerosis, 2+ Cortical cataract   Vitreous Vitreous syneresis, +Posterior vitreous detachment, trace cell Vitreous syneresis, Posterior vitreous detachment, +cell       Fundus Exam  Right Left   Disc Pink and Sharp inferior hyperemia - improved, +heme at 0400, fine NVD at 0800   C/D Ratio 0.5 0.6   Macula Flat,  good foveal reflex, mild RPE mottling and clumping, No heme or edema Blunted foveal reflex, focal blot hemes nasal macula extending from disc to fovea - slightly improved from prior, central RPE clumping, mild interval decrease in cystic changes   Vessels Vascular attenuation, Tortuous Vascular attenuation, Tortuous, Copper wiring, severe AV crossing changes, +inferior HRVO   Periphery Attached, pigmented Chorioretinal scar at 1030 Attached, pigmented lattice with 3 atrophic holes at 1030 ora - good laser surrounding, cluster of blot hemes greatest temporally and inferiorly greatest at 0300, pigmented macroaneurysm at 0300 - improving          IMAGING AND PROCEDURES  Imaging and Procedures for 08/31/17  Intravitreal Injection, Pharmacologic Agent - OS - Left Eye       Time Out 03/25/2019. 2:00 PM. Confirmed correct patient, procedure, site, and patient consented.   Anesthesia Topical anesthesia was used. Anesthetic medications included Lidocaine 2%, Proparacaine 0.5%.   Procedure Preparation included 5% betadine to ocular surface, eyelid speculum. A 30 gauge needle was used.   Injection:  1.25 mg Bevacizumab (AVASTIN) SOLN   NDC: 51761-607-37, Lot: [id number] , Expiration date: 06/07/2019   Route: Intravitreal, Site: Left Eye, Waste: 0 mL  Post-op Post injection exam found visual acuity of at least counting fingers. The patient tolerated the procedure well. There were no complications. The patient received written and verbal post procedure care education.        OCT, Retina - OU - Both Eyes       Right Eye Quality was good. Central Foveal Thickness: 242. Progression has been stable. Findings include normal foveal contour, no SRF, no IRF, vitreomacular adhesion , inner retinal atrophy (Inner retinal atrophy IT quad caught on widefield).   Left Eye Quality was good. Central Foveal Thickness: 254. Progression has been stable. Findings include normal foveal contour, no SRF,  epiretinal membrane, vitreomacular adhesion , outer retinal atrophy, intraretinal hyper-reflective material, intraretinal fluid (Persistent cystic changes IN macula; patchy ORA).   Notes *Images captured and stored on drive  Diagnosis / Impression:  OD: NFP, No IRF/SRF; Inner retinal atrophy IT quad caught on widefield - stable OS: trace ERM; Focal patches of ORA, interval improvement in IRF  Clinical management:  See below  Abbreviations: NFP - Normal foveal profile. CME - cystoid macular edema. PED - pigment epithelial detachment. IRF - intraretinal fluid. SRF - subretinal fluid. EZ - ellipsoid zone. ERM - epiretinal membrane. ORA - outer retinal atrophy. ORT - outer retinal tubulation. SRHM - subretinal hyper-reflective material                  ASSESSMENT/PLAN:    ICD-10-CM   1. Branch retinal vein occlusion of left eye with macular edema  H34.8320 Intravitreal Injection, Pharmacologic Agent - OS - Left Eye    Bevacizumab (AVASTIN) SOLN 1.25 mg  2. Retinal edema  H35.81 OCT, Retina - OU - Both Eyes  3. Herpes keratitis  B00.52   4. Anterior uveitis  H20.9   5. Branch retinal vein occlusion of right eye with macular edema  H34.8310   6. Lattice degeneration of left retina  H35.412   7. Atrophic retinal break, multiple, left eye  H33.332   8. Posterior vitreous detachment of both eyes  H43.813   9. Combined forms of age-related cataract of both eyes  H25.813  10. Hypertensive retinopathy of both eyes  H35.033   11. Acute anterior uveitis of right eye  H20.00    1,2. Inferior HRVO OS  - just prior to 9.24.20 visit, found to have a clot in fistula, left arm  - exam shows clusters of DBH inferior hemispheric distribution and telangectatic vessels  - s/p IVA OS #1 (09.24.20), #2 (10.22.20)  - BCVA improved to 20/30 from 20/40  - OCT shows scattered ORA and persistent IRF  - recommend IVA OS #3 today, 11.23.20  - pt wishes to proceed  - RBA of procedure discussed,  questions answered  - informed consent obtained and signed  - see procedure note  - F/U 4 week -- DFE/OCT/possible injection  3,4. 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  - likely etiology of prior episodes of Anterior Uveitis OD  - FA on 3.18.19 without posterior involvement, vasculitis or leakage OD  - had recurrent AC cell and stellate KP on 3.11.2020 - restarted PF QID OD  - today KP and injection stably resolved, only rare pigment remains  - IOP 22 OD, 18 OS today   - cont Pred Forte to QD OD  - given history of repeat recurrences of anterior uveitis despite maintenance acyclovir, may need to be on a maintenance dose of topical steroid  - cont Cosopt BID OU  - epithelial dendrite remains resolved today (on po acyclovir; off topical trifluridine)  - under the expert management of Dr. Kathlen Mody  - continue po acyclovir, 400 mg QD per Dr. Kathlen Mody  5. History of BRVO w/ CME OD  - s/p IVA OD #1 (12.21.18), #2 (01.18.19), #3 (02.14.19), #4 (04.02.19), #5 (05.02.19)  - OCT today with stable improvement in IRF despite no antiVEGF therapy since May 2019 -- resolved  - will continue to hold on injection OD  6,7. Lattice degeneration with atrophic holes OS-   - peripheral holes superiorly at 1030 without SRF or RD  - S/P laser retinopexy OS (01.04.19)  - good laser in place--stable  8. PVD / vitreous syneresis OU  - Discussed findings and prognosis  - No RT or RD on 360 scleral depressed exam  - Reviewed s/s of RT/RD  - Strict return precautions for any such RT/RD signs/symptoms  9. Combined forms age-related cataract OU-   - The symptoms of cataract, surgical options, and treatments and risks were discussed with patient.  - discussed diagnosis and progression  - not yet visually significant  - monitor for now   Ophthalmic Meds Ordered this visit:  Meds ordered this encounter  Medications  . Bevacizumab (AVASTIN) SOLN 1.25 mg       Return in about 4 weeks  (around 04/22/2019) for f/u HRVO OS, DFE, OCT.  There are no Patient Instructions on file for this visit.   This document serves as a record of services personally performed by Gardiner Sleeper, MD, PhD. It was created on their behalf by Ernest Mallick, OA, an ophthalmic assistant. The creation of this record is the provider's dictation and/or activities during the visit.    Electronically signed by: Ernest Mallick, OA 11.23.2020 11:17 PM  Gardiner Sleeper, M.D., Ph.D. Diseases & Surgery of the Retina and Vitreous [facility]  I have reviewed the above documentation for accuracy and completeness, and I agree with the above. Gardiner Sleeper, M.D., Ph.D. 03/25/19 11:18 PM    Abbreviations: M myopia (nearsighted); A astigmatism; H hyperopia (farsighted); P presbyopia; Mrx spectacle prescription;  CTL contact lenses; OD right eye; OS left eye; OU both eyes  XT exotropia; ET esotropia; PEK punctate epithelial keratitis; PEE punctate epithelial erosions; DES dry eye syndrome; MGD meibomian gland dysfunction; ATs artificial tears; PFAT's preservative free artificial tears; [place] nuclear sclerotic cataract; PSC posterior subcapsular cataract; ERM epi-retinal membrane; PVD posterior vitreous detachment; RD retinal detachment; DM diabetes mellitus; DR diabetic retinopathy; NPDR non-proliferative diabetic retinopathy; PDR proliferative diabetic retinopathy; CSME clinically significant macular edema; DME diabetic macular edema; dbh dot blot hemorrhages; CWS cotton wool spot; POAG primary open angle glaucoma; C/D cup-to-disc ratio; HVF humphrey visual field; GVF goldmann visual field; OCT optical coherence tomography; IOP intraocular pressure; BRVO Branch retinal vein occlusion; CRVO central retinal vein occlusion; CRAO central retinal artery occlusion; BRAO branch retinal artery occlusion; RT retinal tear; SB scleral buckle; PPV pars plana vitrectomy; VH Vitreous hemorrhage; PRP panretinal laser  photocoagulation; IVK intravitreal kenalog; VMT vitreomacular traction; MH Macular hole;  NVD neovascularization of the disc; NVE neovascularization elsewhere; AREDS age related eye disease study; ARMD age related macular degeneration; POAG primary open angle glaucoma; EBMD epithelial/anterior basement membrane dystrophy; ACIOL anterior chamber intraocular lens; IOL intraocular lens; PCIOL posterior chamber intraocular lens; Phaco/IOL phacoemulsification with intraocular lens placement; [place] photorefractive keratectomy; LASIK laser assisted in situ keratomileusis; HTN hypertension; DM diabetes mellitus; COPD chronic obstructive pulmonary disease

## 2019-03-26 ENCOUNTER — Other Ambulatory Visit: Payer: Self-pay

## 2019-03-26 ENCOUNTER — Ambulatory Visit (INDEPENDENT_AMBULATORY_CARE_PROVIDER_SITE_OTHER): Payer: Medicare Other | Admitting: Internal Medicine

## 2019-03-26 ENCOUNTER — Encounter: Payer: Self-pay | Admitting: Internal Medicine

## 2019-03-26 VITALS — BP 102/70 | HR 90 | Ht 68.0 in | Wt 175.0 lb

## 2019-03-26 DIAGNOSIS — I472 Ventricular tachycardia, unspecified: Secondary | ICD-10-CM

## 2019-03-26 DIAGNOSIS — I428 Other cardiomyopathies: Secondary | ICD-10-CM | POA: Diagnosis not present

## 2019-03-26 DIAGNOSIS — I471 Supraventricular tachycardia: Secondary | ICD-10-CM

## 2019-03-26 DIAGNOSIS — N186 End stage renal disease: Secondary | ICD-10-CM | POA: Diagnosis not present

## 2019-03-26 DIAGNOSIS — Z992 Dependence on renal dialysis: Secondary | ICD-10-CM | POA: Diagnosis not present

## 2019-03-26 DIAGNOSIS — N2581 Secondary hyperparathyroidism of renal origin: Secondary | ICD-10-CM | POA: Diagnosis not present

## 2019-03-26 NOTE — H&P (View-Only) (Signed)
HPI [name] returns today for followup of atrial flutter/tachycardia. He is a pleasant 63 yo man with ESRD on HD who was seen by me over a month ago. He was placed on toprol but could not tolerate due to hypotension. He does not have palpitations. He does have LV dysfunction. No edema. No syncope.  No Known Allergies   Current Outpatient Medications  Medication Sig Dispense Refill  . acyclovir (ZOVIRAX) 400 MG tablet Take 400 mg by mouth 2 (two) times daily.     [name] aspirin EC 81 MG EC tablet Take 1 tablet (81 mg total) by mouth daily.    [name] atorvastatin (LIPITOR) 40 MG tablet Take 40 mg by mouth every evening.     . dorzolamide-timolol (COSOPT) 22.3-6.8 MG/ML ophthalmic solution Place 1 drop into both eyes 2 (two) times daily. 10 mL 10  . HYDROcodone-acetaminophen (NORCO) 5-325 MG tablet Take 1 tablet by mouth every 6 (six) hours as needed for moderate pain. 20 tablet 0  . metoprolol succinate (TOPROL XL) 25 MG 24 hr tablet Take 0.5 tablets (12.5 mg total) by mouth daily. (Patient not taking: Reported on 03/07/2019) 45 tablet 3  . nitroGLYCERIN (NITROSTAT) 0.4 MG SL tablet Place 1 tablet (0.4 mg total) under the tongue every 5 (five) minutes x 3 doses as needed for chest pain. 30 tablet 12  . prednisoLONE acetate (PRED FORTE) 1 % ophthalmic suspension Place 1 drop into the right eye daily.     . sevelamer carbonate (RENVELA) 800 MG tablet Take 1,600 mg by mouth 3 (three) times daily with meals.     No current facility-administered medications for this visit.      Past Medical History:  Diagnosis Date  . Atrial arrhythmia    atrial tachycardia with variable AV conduction versus atypical aflutter 01/10/19, rate control (02/06/19)  . Cataract   . Dyspnea    on exertion  . ESRD (end stage renal disease) ([place])    Hemo- MWF, Polycystic kidney disease  . Fatigue   . History of kidney stones    removal of stone- cysto  . Hyperlipidemia   . Hyperparathyroidism, secondary renal ([place])    . Hypertension   . Hypoxemia 12/12/2013  . Nonischemic cardiomyopathy ([place])    Er 25% 2015, 55 % 2013  . OSA on CPAP    no longer using cpap  . OSA on CPAP 03/24/2014  . Pneumonia    2015ish  . Ventricular tachycardia//Freq PVCs   . Wears glasses     ROS:   All systems reviewed and negative except as noted in the HPI.   Past Surgical History:  Procedure Laterality Date  . A/V FISTULAGRAM Left 04/27/2017   Procedure: A/V FISTULAGRAM;  Surgeon: Conrad Willisville, MD;  Location: [facility];  Service: Cardiovascular;  Laterality: Left;  lt arm  . A/V FISTULAGRAM Left 01/10/2019   Procedure: A/V FISTULAGRAM;  Surgeon: Marty Heck, MD;  Location: [facility];  Service: Cardiovascular;  Laterality: Left;  . APPENDECTOMY    . AV FISTULA PLACEMENT  12/05/2011   Procedure: ARTERIOVENOUS (AV) FISTULA CREATION;LLEFT ARM  Surgeon: Conrad Fronton, MD;  Location: [facility];  Service: Vascular;  Laterality: Left;  RADIO-CEPHALIC  fistula left arm  . AV FISTULA PLACEMENT  01/11/2012   Procedure: ARTERIOVENOUS (AV) FISTULA CREATION;  Surgeon: Conrad Sedan, MD;  Location: [facility];  Service: Vascular;  Laterality: Left;  Creation of left brachial cephalic arteriovenous fistula  .  AV FISTULA PLACEMENT Right 03/07/2019   Procedure: ARTERIOVENOUS (AV) FISTULA CREATION  RIGHT ARM;  Surgeon: Marty Heck, MD;  Location: [facility];  Service: Vascular;  Laterality: Right;  . BASCILIC VEIN TRANSPOSITION Left 12/27/2016   Procedure: BASILIC VEIN TRANSPOSITION LEFT UPPER ARM FIRST STAGE;  Surgeon: Conrad John Day, MD;  Location: [facility];  Service: Vascular;  Laterality: Left;  . BASCILIC VEIN TRANSPOSITION Left 01/31/2017   Procedure: LEFT ARM BASILIC VEIN TRANSPOSITION, SECOND STAGE;  Surgeon: Conrad Morrow, MD;  Location: [facility];  Service: Vascular;  Laterality: Left;  . CARDIAC CATHETERIZATION  04-05-2010   checking for blockage but none-[facility]  . COLONOSCOPY    . CYSTOSCOPY W/ STONE MANIPULATION      "laser once" (01/22/2013)  . HEMATOMA EVACUATION Left 05/09/2017   Procedure: EVACUATION HEMATOMA LEFT ARM;  Surgeon: Conrad Tres Pinos, MD;  Location: [facility];  Service: Vascular;  Laterality: Left;  . HERNIA REPAIR    . INGUINAL HERNIA REPAIR Right 11/06/2015   Procedure: OPEN HERNIA REPAIR  RIGHT INGUINAL ADULT;  Surgeon: [name], MD;  Location: [facility];  Service: General;  Laterality: Right;  with MESH  . INSERTION OF DIALYSIS CATHETER Right 10/05/2016   Procedure: INSERTION OF right internal jugular DIALYSIS CATHETER;  Surgeon: [name], MD;  Location: [facility];  Service: Vascular;  Laterality: Right;  . INSERTION OF MESH  03/20/2012   Procedure: INSERTION OF MESH;  UMB Surgeon: [name], MD;  Location: [facility];  Service: General;  Laterality: N/A;  . INSERTION OF MESH N/A 01/22/2013   Procedure: INSERTION OF MESH;  Surgeon: [name], MD;  Location: [facility];  Service: General;  Laterality: N/A;  . LAPAROTOMY  04/02/2012   Procedure: EXPLORATORY LAPAROTOMY;  Surgeon: [name], MD;  Location: [facility];  Service: General;  Laterality: N/A;  Exploratory Laparotomy with resection of small intestine  . LEFT HEART CATHETERIZATION WITH CORONARY ANGIOGRAM N/A 05/14/2013   Procedure: LEFT HEART CATHETERIZATION WITH CORONARY ANGIOGRAM;  Surgeon: Sinclair Grooms, MD;  Location: [facility];  Service: Cardiovascular;  Laterality: N/A;  . LIGATION OF ARTERIOVENOUS  FISTULA Left 12/27/2016   Procedure: LIGATION/EXCISION OF LEFT UPPER ARM ARTERIOVENOUS  FISTULA;  EVACUATION OF HEMATOMA;  Surgeon: Conrad Sumatra, MD;  Location: [facility];  Service: Vascular;  Laterality: Left;  . LIGATION OF ARTERIOVENOUS  FISTULA Left 03/07/2019   Procedure: LIGATION OF ARTERIOVENOUS FISTULA  LEFT UPPER ARM;  Surgeon: Marty Heck, MD;  Location: [facility];  Service: Vascular;  Laterality: Left;  . REVISON OF ARTERIOVENOUS FISTULA Left 10/05/2016   Procedure: REVISON OF left arm ARTERIOVENOUS FISTULA;  Surgeon:  [name], MD;  Location: [facility];  Service: Vascular;  Laterality: Left;  . TONSILLECTOMY    . UMBILICAL HERNIA REPAIR  03/20/2012   Procedure: HERNIA REPAIR UMBILICAL ADULT;  Surgeon: [name], MD;  Location: [facility];  Service: General;  Laterality: N/A;  . UMBILICAL HERNIA REPAIR  01/22/2013   preperitoneal open procedure due to significant adhesions/notes 01/22/2013  . VENTRAL HERNIA REPAIR N/A 01/22/2013   Procedure: ATTEMPTED LAPAROSCOPIC VENTRAL HERNIA CONVERTED TO OPEN;  Surgeon: [name], MD;  Location: [facility];  Service: General;  Laterality: N/A;     Family History  Problem Relation Age of Onset  . Heart disease Mother   . Hyperlipidemia Mother   . Hypertension Mother   . Kidney disease Father   . Stroke Father   . Kidney disease Brother   .  Amblyopia Neg Hx   . Blindness Neg Hx   . Cataracts Neg Hx   . Diabetes Neg Hx   . Glaucoma Neg Hx   . Macular degeneration Neg Hx   . Retinal detachment Neg Hx   . Strabismus Neg Hx   . Retinitis pigmentosa Neg Hx      Social History   Socioeconomic History  . Marital status: Married    Spouse name: Michael [name]  . Number of children: 3  . Years of education: 18  . Highest education level: Not on file  Occupational History    Comment: disabled  Social Needs  . Financial resource strain: Not on file  . Food insecurity    Worry: Not on file    Inability: Not on file  . Transportation needs    Medical: Not on file    Non-medical: Not on file  Tobacco Use  . Smoking status: Never Smoker  . Smokeless tobacco: Never Used  Substance and Sexual Activity  . Alcohol use: No    Alcohol/week: 0.0 standard drinks  . Drug use: No  . Sexual activity: Yes  Lifestyle  . Physical activity    Days per week: Not on file    Minutes per session: Not on file  . Stress: Not on file  Relationships  . Social Herbalist on phone: Not on file    Gets together: Not on file    Attends religious service: Not on file     Active member of club or organization: Not on file    Attends meetings of clubs or organizations: Not on file    Relationship status: Not on file  . Intimate partner violence    Fear of current or ex partner: Not on file    Emotionally abused: Not on file    Physically abused: Not on file    Forced sexual activity: Not on file  Other Topics Concern  . Not on file  Social History Narrative   Patient is married [place]) and lives at home with his wife and children.   Patient has three children.   Patient is in disability.   Patient has a high school education.   Patient is right-handed   Patient drinks very little soda.              BP 102/70   Pulse 90   Ht 5\' 8"  (1.727 m)   Wt 175 lb (79.4 kg)   SpO2 (!) 82%   BMI 26.61 kg/m   Physical Exam:  Well appearing NAD HEENT: Unremarkable Neck:  No JVD, no thyromegally Lymphatics:  No adenopathy Back:  No CVA tenderness Lungs:  Clear with no wheezes HEART:  Regular rate rhythm, no murmurs, no rubs, no clicks Abd:  soft, positive bowel sounds, no organomegally, no rebound, no guarding Ext:  2 plus pulses, no edema, no cyanosis, no clubbing Skin:  No rashes no nodules Neuro:  CN II through XII intact, motor grossly intact  EKG - atrial flutter/tachycardia with variable AV but mostly 2:1 conduction.  Assess/Plan: 1. Atrial tachy/flutter - I have discussed the treatment options with the patient and recommended EPS catheter ablation. I think his arrhythmia is in the RA.  2. ESRD - he is currently dialyzing in the right IJ and is awaiting maturation of his right arm fistula/graft. 3. Dyslipidemia - he will continue his statin therapy. 4. HTN - his blood pressure now tends to run low.  We will follow.  Mikle [email or web address].D.

## 2019-03-26 NOTE — Patient Instructions (Addendum)
Medication Instructions:  Your physician recommends that you continue on your current medications as directed. Please refer to the Current Medication list given to you today.  Pt stopped Toprol XL.  BP too low, unable to tolerate  Labwork: You will get lab work today:  BMP and CBC  Testing/Procedures: None ordered.  Follow-Up:  The following date is available for procedures: December 10  Any Other Special Instructions Will Be Listed Below (If Applicable).  If you need a refill on your cardiac medications before your next appointment, please call your pharmacy.    Cardiac Ablation Cardiac ablation is a procedure to disable (ablate) a small amount of heart tissue in very specific places. The heart has many electrical connections. Sometimes these connections are abnormal and can cause the heart to beat very fast or irregularly. Ablating some of the problem areas can improve the heart rhythm or return it to normal. Ablation may be done for people who:  Have Wolff-Parkinson-White syndrome.  Have fast heart rhythms (tachycardia).  Have taken medicines for an abnormal heart rhythm (arrhythmia) that were not effective or caused side effects.  Have a high-risk heartbeat that may be life-threatening. During the procedure, a small incision is made in the neck or the groin, and a long, thin, flexible tube (catheter) is inserted into the incision and moved to the heart. Small devices (electrodes) on the tip of the catheter will send out electrical currents. A type of X-ray (fluoroscopy) will be used to help guide the catheter and to provide images of the heart. Tell a health care provider about:  Any allergies you have.  All medicines you are taking, including vitamins, herbs, eye drops, creams, and over-the-counter medicines.  Any problems you or family members have had with anesthetic medicines.  Any blood disorders you have.  Any surgeries you have had.  Any medical conditions you  have, such as kidney failure.  Whether you are pregnant or may be pregnant. What are the risks? Generally, this is a safe procedure. However, problems may occur, including:  Infection.  Bruising and bleeding at the catheter insertion site.  Bleeding into the chest, especially into the sac that surrounds the heart. This is a serious complication.  Stroke or blood clots.  Damage to other structures or organs.  Allergic reaction to medicines or dyes.  Need for a permanent pacemaker if the normal electrical system is damaged. A pacemaker is a small computer that sends electrical signals to the heart and helps your heart beat normally.  The procedure not being fully effective. This may not be recognized until months later. Repeat ablation procedures are sometimes required. What happens before the procedure?  Follow instructions from your health care provider about eating or drinking restrictions.  Ask your health care provider about: ? Changing or stopping your regular medicines. This is especially important if you are taking diabetes medicines or blood thinners. ? Taking medicines such as aspirin and ibuprofen. These medicines can thin your blood. Do not take these medicines before your procedure if your health care provider instructs you not to.  Plan to have someone take you home from the hospital or clinic.  If you will be going home right after the procedure, plan to have someone with you for 24 hours. What happens during the procedure?  To lower your risk of infection: ? Your health care team will wash or sanitize their hands. ? Your skin will be washed with soap. ? Hair may be removed from the incision area.  An IV tube will be inserted into one of your veins.  You will be given a medicine to help you relax (sedative).  The skin on your neck or groin will be numbed.  An incision will be made in your neck or your groin.  A needle will be inserted through the incision  and into a large vein in your neck or groin.  A catheter will be inserted into the needle and moved to your heart.  Dye may be injected through the catheter to help your surgeon see the area of the heart that needs treatment.  Electrical currents will be sent from the catheter to ablate heart tissue in desired areas. There are three types of energy that may be used to ablate heart tissue: ? Heat (radiofrequency energy). ? Laser energy. ? Extreme cold (cryoablation).  When the necessary tissue has been ablated, the catheter will be removed.  Pressure will be held on the catheter insertion area to prevent excessive bleeding.  A bandage (dressing) will be placed over the catheter insertion area. The procedure may vary among health care providers and hospitals. What happens after the procedure?  Your blood pressure, heart rate, breathing rate, and blood oxygen level will be monitored until the medicines you were given have worn off.  Your catheter insertion area will be monitored for bleeding. You will need to lie still for a few hours to ensure that you do not bleed from the catheter insertion area.  Do not drive for 24 hours or as long as directed by your health care provider. Summary  Cardiac ablation is a procedure to disable (ablate) a small amount of heart tissue in very specific places. Ablating some of the problem areas can improve the heart rhythm or return it to normal.  During the procedure, electrical currents will be sent from the catheter to ablate heart tissue in desired areas. This information is not intended to replace advice given to you by your health care provider. Make sure you discuss any questions you have with your health care provider. Document Released: 09/04/2008 Document Revised: 10/09/2017 Document Reviewed: 03/07/2016 Elsevier Patient Education  2020 [employer].

## 2019-03-26 NOTE — Progress Notes (Signed)
HPI Michael [name] returns today for followup of atrial flutter/tachycardia. He is a pleasant 63 yo man with ESRD on HD who was seen by me over a month ago. He was placed on toprol but could not tolerate due to hypotension. He does not have palpitations. He does have LV dysfunction. No edema. No syncope.  No Known Allergies   Current Outpatient Medications  Medication Sig Dispense Refill  . acyclovir (ZOVIRAX) 400 MG tablet Take 400 mg by mouth 2 (two) times daily.     [name] aspirin EC 81 MG EC tablet Take 1 tablet (81 mg total) by mouth daily.    [name] atorvastatin (LIPITOR) 40 MG tablet Take 40 mg by mouth every evening.     . dorzolamide-timolol (COSOPT) 22.3-6.8 MG/ML ophthalmic solution Place 1 drop into both eyes 2 (two) times daily. 10 mL 10  . HYDROcodone-acetaminophen (NORCO) 5-325 MG tablet Take 1 tablet by mouth every 6 (six) hours as needed for moderate pain. 20 tablet 0  . metoprolol succinate (TOPROL XL) 25 MG 24 hr tablet Take 0.5 tablets (12.5 mg total) by mouth daily. (Patient not taking: Reported on 03/07/2019) 45 tablet 3  . nitroGLYCERIN (NITROSTAT) 0.4 MG SL tablet Place 1 tablet (0.4 mg total) under the tongue every 5 (five) minutes x 3 doses as needed for chest pain. 30 tablet 12  . prednisoLONE acetate (PRED FORTE) 1 % ophthalmic suspension Place 1 drop into the right eye daily.     . sevelamer carbonate (RENVELA) 800 MG tablet Take 1,600 mg by mouth 3 (three) times daily with meals.     No current facility-administered medications for this visit.      Past Medical History:  Diagnosis Date  . Atrial arrhythmia    atrial tachycardia with variable AV conduction versus atypical aflutter 01/10/19, rate control (02/06/19)  . Cataract   . Dyspnea    on exertion  . ESRD (end stage renal disease) ([place])    Hemo- MWF, Polycystic kidney disease  . Fatigue   . History of kidney stones    removal of stone- cysto  . Hyperlipidemia   . Hyperparathyroidism, secondary renal ([place])    . Hypertension   . Hypoxemia 12/12/2013  . Nonischemic cardiomyopathy ([place])    Er 25% 2015, 55 % 2013  . OSA on CPAP    no longer using cpap  . OSA on CPAP 03/24/2014  . Pneumonia    2015ish  . Ventricular tachycardia//Freq PVCs   . Wears glasses     ROS:   All systems reviewed and negative except as noted in the HPI.   Past Surgical History:  Procedure Laterality Date  . A/V FISTULAGRAM Left 04/27/2017   Procedure: A/V FISTULAGRAM;  Surgeon: Conrad Stinesville, MD;  Location: [facility];  Service: Cardiovascular;  Laterality: Left;  lt arm  . A/V FISTULAGRAM Left 01/10/2019   Procedure: A/V FISTULAGRAM;  Surgeon: Marty Heck, MD;  Location: [facility];  Service: Cardiovascular;  Laterality: Left;  . APPENDECTOMY    . AV FISTULA PLACEMENT  12/05/2011   Procedure: ARTERIOVENOUS (AV) FISTULA CREATION;LLEFT ARM  Surgeon: Conrad Spartanburg, MD;  Location: [place];  Service: Vascular;  Laterality: Left;  RADIO-CEPHALIC  fistula left arm  . AV FISTULA PLACEMENT  01/11/2012   Procedure: ARTERIOVENOUS (AV) FISTULA CREATION;  Surgeon: Conrad , MD;  Location: [place];  Service: Vascular;  Laterality: Left;  Creation of left brachial cephalic arteriovenous fistula  .  AV FISTULA PLACEMENT Right 03/07/2019   Procedure: ARTERIOVENOUS (AV) FISTULA CREATION  RIGHT ARM;  Surgeon: Marty Heck, MD;  Location: [place];  Service: Vascular;  Laterality: Right;  . BASCILIC VEIN TRANSPOSITION Left 12/27/2016   Procedure: BASILIC VEIN TRANSPOSITION LEFT UPPER ARM FIRST STAGE;  Surgeon: Conrad Pierpoint, MD;  Location: [facility];  Service: Vascular;  Laterality: Left;  . BASCILIC VEIN TRANSPOSITION Left 01/31/2017   Procedure: LEFT ARM BASILIC VEIN TRANSPOSITION, SECOND STAGE;  Surgeon: Conrad Moulton, MD;  Location: [place];  Service: Vascular;  Laterality: Left;  . CARDIAC CATHETERIZATION  04-05-2010   checking for blockage but none-[facility]  . COLONOSCOPY    . CYSTOSCOPY W/ STONE MANIPULATION      "laser once" (01/22/2013)  . HEMATOMA EVACUATION Left 05/09/2017   Procedure: EVACUATION HEMATOMA LEFT ARM;  Surgeon: Conrad Shickshinny, MD;  Location: [facility];  Service: Vascular;  Laterality: Left;  . HERNIA REPAIR    . INGUINAL HERNIA REPAIR Right 11/06/2015   Procedure: OPEN HERNIA REPAIR  RIGHT INGUINAL ADULT;  Surgeon: [name], MD;  Location: [facility];  Service: General;  Laterality: Right;  with MESH  . INSERTION OF DIALYSIS CATHETER Right 10/05/2016   Procedure: INSERTION OF right internal jugular DIALYSIS CATHETER;  Surgeon: [name], MD;  Location: [place];  Service: Vascular;  Laterality: Right;  . INSERTION OF MESH  03/20/2012   Procedure: INSERTION OF MESH;  UMB Surgeon: [name], MD;  Location: [place];  Service: General;  Laterality: N/A;  . INSERTION OF MESH N/A 01/22/2013   Procedure: INSERTION OF MESH;  Surgeon: [name], MD;  Location: [place];  Service: General;  Laterality: N/A;  . LAPAROTOMY  04/02/2012   Procedure: EXPLORATORY LAPAROTOMY;  Surgeon: [name], MD;  Location: [place];  Service: General;  Laterality: N/A;  Exploratory Laparotomy with resection of small intestine  . LEFT HEART CATHETERIZATION WITH CORONARY ANGIOGRAM N/A 05/14/2013   Procedure: LEFT HEART CATHETERIZATION WITH CORONARY ANGIOGRAM;  Surgeon: Sinclair Grooms, MD;  Location: [facility];  Service: Cardiovascular;  Laterality: N/A;  . LIGATION OF ARTERIOVENOUS  FISTULA Left 12/27/2016   Procedure: LIGATION/EXCISION OF LEFT UPPER ARM ARTERIOVENOUS  FISTULA;  EVACUATION OF HEMATOMA;  Surgeon: Conrad Plano, MD;  Location: [place];  Service: Vascular;  Laterality: Left;  . LIGATION OF ARTERIOVENOUS  FISTULA Left 03/07/2019   Procedure: LIGATION OF ARTERIOVENOUS FISTULA  LEFT UPPER ARM;  Surgeon: Marty Heck, MD;  Location: [place];  Service: Vascular;  Laterality: Left;  . REVISON OF ARTERIOVENOUS FISTULA Left 10/05/2016   Procedure: REVISON OF left arm ARTERIOVENOUS FISTULA;  Surgeon:  [name], MD;  Location: [place];  Service: Vascular;  Laterality: Left;  . TONSILLECTOMY    . UMBILICAL HERNIA REPAIR  03/20/2012   Procedure: HERNIA REPAIR UMBILICAL ADULT;  Surgeon: [name], MD;  Location: [place];  Service: General;  Laterality: N/A;  . UMBILICAL HERNIA REPAIR  01/22/2013   preperitoneal open procedure due to significant adhesions/notes 01/22/2013  . VENTRAL HERNIA REPAIR N/A 01/22/2013   Procedure: ATTEMPTED LAPAROSCOPIC VENTRAL HERNIA CONVERTED TO OPEN;  Surgeon: [name], MD;  Location: [facility];  Service: General;  Laterality: N/A;     Family History  Problem Relation Age of Onset  . Heart disease Mother   . Hyperlipidemia Mother   . Hypertension Mother   . Kidney disease Father   . Stroke Father   . Kidney disease Brother   .  Amblyopia Neg Hx   . Blindness Neg Hx   . Cataracts Neg Hx   . Diabetes Neg Hx   . Glaucoma Neg Hx   . Macular degeneration Neg Hx   . Retinal detachment Neg Hx   . Strabismus Neg Hx   . Retinitis pigmentosa Neg Hx      Social History   Socioeconomic History  . Marital status: Married    Spouse name: [name]  . Number of children: 3  . Years of education: 41  . Highest education level: Not on file  Occupational History    Comment: disabled  Social Needs  . Financial resource strain: Not on file  . Food insecurity    Worry: Not on file    Inability: Not on file  . Transportation needs    Medical: Not on file    Non-medical: Not on file  Tobacco Use  . Smoking status: Never Smoker  . Smokeless tobacco: Never Used  Substance and Sexual Activity  . Alcohol use: No    Alcohol/week: 0.0 standard drinks  . Drug use: No  . Sexual activity: Yes  Lifestyle  . Physical activity    Days per week: Not on file    Minutes per session: Not on file  . Stress: Not on file  Relationships  . Social Herbalist on phone: Not on file    Gets together: Not on file    Attends religious service: Not on file     Active member of club or organization: Not on file    Attends meetings of clubs or organizations: Not on file    Relationship status: Not on file  . Intimate partner violence    Fear of current or ex partner: Not on file    Emotionally abused: Not on file    Physically abused: Not on file    Forced sexual activity: Not on file  Other Topics Concern  . Not on file  Social History Narrative   Patient is married [place]) and lives at home with his wife and children.   Patient has three children.   Patient is in disability.   Patient has a high school education.   Patient is right-handed   Patient drinks very little soda.              BP 102/70   Pulse 90   Ht 5\' 8"  (1.727 m)   Wt 175 lb (79.4 kg)   SpO2 (!) 82%   BMI 26.61 kg/m   Physical Exam:  Well appearing NAD HEENT: Unremarkable Neck:  No JVD, no thyromegally Lymphatics:  No adenopathy Back:  No CVA tenderness Lungs:  Clear with no wheezes HEART:  Regular rate rhythm, no murmurs, no rubs, no clicks Abd:  soft, positive bowel sounds, no organomegally, no rebound, no guarding Ext:  2 plus pulses, no edema, no cyanosis, no clubbing Skin:  No rashes no nodules Neuro:  CN II through XII intact, motor grossly intact  EKG - atrial flutter/tachycardia with variable AV but mostly 2:1 conduction.  Assess/Plan: 1. Atrial tachy/flutter - I have discussed the treatment options with the patient and recommended EPS catheter ablation. I think his arrhythmia is in the RA.  2. ESRD - he is currently dialyzing in the right IJ and is awaiting maturation of his right arm fistula/graft. 3. Dyslipidemia - he will continue his statin therapy. 4. HTN - his blood pressure now tends to run low.  We will follow.  Michael [name].D.

## 2019-03-27 ENCOUNTER — Telehealth: Payer: Self-pay

## 2019-03-27 LAB — CBC WITH DIFFERENTIAL/PLATELET
Basophils Absolute: 0.1 10*3/uL (ref 0.0–0.2)
Basos: 2 %
EOS (ABSOLUTE): 0.2 10*3/uL (ref 0.0–0.4)
Eos: 6 %
Hematocrit: 38.4 % (ref 37.5–51.0)
Hemoglobin: 11.7 g/dL — ABNORMAL LOW (ref 13.0–17.7)
Immature Grans (Abs): 0 10*3/uL (ref 0.0–0.1)
Immature Granulocytes: 1 %
Lymphocytes Absolute: 0.4 10*3/uL — ABNORMAL LOW (ref 0.7–3.1)
Lymphs: 11 %
MCH: 23.7 pg — ABNORMAL LOW (ref 26.6–33.0)
MCHC: 30.5 g/dL — ABNORMAL LOW (ref 31.5–35.7)
MCV: 78 fL — ABNORMAL LOW (ref 79–97)
Monocytes Absolute: 0.5 10*3/uL (ref 0.1–0.9)
Monocytes: 13 %
Neutrophils Absolute: 2.4 10*3/uL (ref 1.4–7.0)
Neutrophils: 67 %
Platelets: 187 10*3/uL (ref 150–450)
RBC: 4.94 x10E6/uL (ref 4.14–5.80)
RDW: 16.1 % — ABNORMAL HIGH (ref 11.6–15.4)
WBC: 3.6 10*3/uL (ref 3.4–10.8)

## 2019-03-27 LAB — BASIC METABOLIC PANEL
BUN/Creatinine Ratio: 3 — ABNORMAL LOW (ref 10–24)
BUN: 15 mg/dL (ref 8–27)
CO2: 24 mmol/L (ref 20–29)
Calcium: 9.5 mg/dL (ref 8.6–10.2)
Chloride: 105 mmol/L (ref 96–106)
Creatinine, Ser: 4.73 mg/dL — ABNORMAL HIGH (ref 0.76–1.27)
GFR calc Af Amer: 14 mL/min/{1.73_m2} — ABNORMAL LOW (ref >59)
GFR calc non Af Amer: 12 mL/min/{1.73_m2} — ABNORMAL LOW (ref >59)
Glucose: 84 mg/dL (ref 65–99)
Potassium: 3.8 mmol/L (ref 3.5–5.2)
Sodium: 147 mmol/L — ABNORMAL HIGH (ref 134–144)

## 2019-03-27 NOTE — Addendum Note (Signed)
Addended by: Willeen Cass A on: 03/27/2019 10:28 AM   Modules accepted: Orders

## 2019-03-27 NOTE — Telephone Encounter (Signed)
Advised Pt scheduled for procedure April 11, 2019 at 11:30 am  Will plan for 8:30 am arrival for rapid covid test d/t Pt requires dialysis

## 2019-03-29 DIAGNOSIS — Z992 Dependence on renal dialysis: Secondary | ICD-10-CM | POA: Diagnosis not present

## 2019-03-29 DIAGNOSIS — N186 End stage renal disease: Secondary | ICD-10-CM | POA: Diagnosis not present

## 2019-03-29 DIAGNOSIS — N2581 Secondary hyperparathyroidism of renal origin: Secondary | ICD-10-CM | POA: Diagnosis not present

## 2019-04-01 DIAGNOSIS — Z992 Dependence on renal dialysis: Secondary | ICD-10-CM | POA: Diagnosis not present

## 2019-04-01 DIAGNOSIS — N2581 Secondary hyperparathyroidism of renal origin: Secondary | ICD-10-CM | POA: Diagnosis not present

## 2019-04-01 DIAGNOSIS — N186 End stage renal disease: Secondary | ICD-10-CM | POA: Diagnosis not present

## 2019-04-03 NOTE — Telephone Encounter (Signed)
Work up complete.    Left message for Pt advising he call office just to go over all instructions prior to procedure.

## 2019-04-04 NOTE — Telephone Encounter (Signed)
Spoke with Pt.  Went over all verbal instructions for upcoming procedure.  Pt has had lab work.  Will have covid test upon arrival for procedure d/t Pt requires dialysis.  Pt aware to arrive at [facility] hospital at 8:30 am on December 10.  All questions answered.  Work up complete.

## 2019-04-09 NOTE — Progress Notes (Signed)
This encounter was created in error - please disregard.  [facility] Note  04/22/2019     CHIEF COMPLAINT Patient presents for Retina Follow Up   HISTORY OF PRESENT ILLNESS: Michael Calcaterra. is a 63 y.o. male who presents to the clinic today for:   HPI    Retina Follow Up    Patient presents with  CRVO/BRVO.  In left eye.  This started months ago.  Severity is moderate.  Duration of 4 weeks.  Since onset it is stable.          Comments    63 y/o male pt here for 4 wk f/u for HRVO OS.  No change in [place] OU.  Denies pain, flashes, floaters.  Cosopt BID OU, Pred QD OD.       Last edited by Matthew Folks, COA on 04/22/2019  1:37 PM. (History)    pt recently  Referring physician: [name], MD [place],  [place] [id number]  HISTORICAL INFORMATION:   Selected notes from the MEDICAL RECORD NUMBER Referred by Dr. [name]. Hecker for concern of HRVO OD; LEE- 11.30.18 ([name]) {BCVA OD: 20/100-1 OS: 20/20-2] Ocular Hx- cataract OU PMH- HTN, high chol, kidney disease, sleep apnea, emphysema    CURRENT MEDICATIONS: Current Outpatient Medications (Ophthalmic Drugs)  Medication Sig  . dorzolamide-timolol (COSOPT) 22.3-6.8 MG/ML ophthalmic solution Place 1 drop into both eyes 2 (two) times daily.  . prednisoLONE acetate (PRED FORTE) 1 % ophthalmic suspension Place 1 drop into the right eye daily at 12 noon.    No current facility-administered medications for this visit. (Ophthalmic Drugs)   Current Outpatient Medications (Other)  Medication Sig  . acyclovir (ZOVIRAX) 400 MG tablet Take 400 mg by mouth every evening.   [name] aspirin EC 81 MG EC tablet Take 1 tablet (81 mg total) by mouth daily. (Patient taking differently: Take 81 mg by mouth every evening. )  . atorvastatin (LIPITOR) 40 MG tablet Take 40 mg by mouth every evening.   [name] HYDROcodone-acetaminophen (NORCO) 5-325 MG tablet Take 1 tablet by mouth every 6 (six) hours  as needed for moderate pain. (Patient not taking: Reported on 04/08/2019)  . nitroGLYCERIN (NITROSTAT) 0.4 MG SL tablet Place 1 tablet (0.4 mg total) under the tongue every 5 (five) minutes x 3 doses as needed for chest pain.  . sevelamer carbonate (RENVELA) 800 MG tablet Take 1,600 mg by mouth 3 (three) times daily with meals.   No current facility-administered medications for this visit. (Other)      REVIEW OF SYSTEMS: ROS    Positive for: Genitourinary, Cardiovascular, Eyes, Respiratory   Negative for: Constitutional, Gastrointestinal, Neurological, Skin, Musculoskeletal, HENT, Endocrine, Psychiatric, Allergic/Imm, Heme/Lymph   Last edited by Matthew Folks, COA on 04/22/2019  1:35 PM. (History)       ALLERGIES No Known Allergies  PAST MEDICAL HISTORY Past Medical History:  Diagnosis Date  . Atrial arrhythmia    atrial tachycardia with variable AV conduction versus atypical aflutter 01/10/19, rate control (02/06/19)  . Cataract   . Dyspnea    on exertion  . ESRD (end stage renal disease) ([facility])    Hemo- MWF, Polycystic kidney disease  . Fatigue   . History of kidney stones    removal of stone- cysto  . Hyperlipidemia   . Hyperparathyroidism, secondary renal ([facility])   . Hypertension   . Hypoxemia 12/12/2013  . Nonischemic cardiomyopathy (H[facility])    Er 25%  2015, 55 % 2013  . OSA on CPAP    no longer using cpap  . OSA on CPAP 03/24/2014  . Pneumonia    2015ish  . Ventricular tachycardia//Freq PVCs   . Wears glasses    Past Surgical History:  Procedure Laterality Date  . A-FLUTTER ABLATION N/A 04/11/2019   Procedure: A-FLUTTER ABLATION;  Surgeon: Evans Lance, MD;  Location: [facility];  Service: Cardiovascular;  Laterality: N/A;  . A/V FISTULAGRAM Left 04/27/2017   Procedure: A/V FISTULAGRAM;  Surgeon: Conrad Towson, MD;  Location: [facility];  Service: Cardiovascular;  Laterality: Left;  lt arm  . A/V FISTULAGRAM Left 01/10/2019   Procedure: A/V  FISTULAGRAM;  Surgeon: Marty Heck, MD;  Location: [facility];  Service: Cardiovascular;  Laterality: Left;  . APPENDECTOMY    . AV FISTULA PLACEMENT  12/05/2011   Procedure: ARTERIOVENOUS (AV) FISTULA CREATION;LLEFT ARM  Surgeon: Conrad Howard, MD;  Location: [facility];  Service: Vascular;  Laterality: Left;  RADIO-CEPHALIC  fistula left arm  . AV FISTULA PLACEMENT  01/11/2012   Procedure: ARTERIOVENOUS (AV) FISTULA CREATION;  Surgeon: Conrad Gideon, MD;  Location: [facility];  Service: Vascular;  Laterality: Left;  Creation of left brachial cephalic arteriovenous fistula  . AV FISTULA PLACEMENT Right 03/07/2019   Procedure: ARTERIOVENOUS (AV) FISTULA CREATION  RIGHT ARM;  Surgeon: Marty Heck, MD;  Location: [facility];  Service: Vascular;  Laterality: Right;  . BASCILIC VEIN TRANSPOSITION Left 12/27/2016   Procedure: BASILIC VEIN TRANSPOSITION LEFT UPPER ARM FIRST STAGE;  Surgeon: Conrad Harper, MD;  Location: [facility];  Service: Vascular;  Laterality: Left;  . BASCILIC VEIN TRANSPOSITION Left 01/31/2017   Procedure: LEFT ARM BASILIC VEIN TRANSPOSITION, SECOND STAGE;  Surgeon: Conrad [place], MD;  Location: [facility];  Service: Vascular;  Laterality: Left;  . CARDIAC CATHETERIZATION  04-05-2010   checking for blockage but none-[facility]  . COLONOSCOPY    . CYSTOSCOPY W/ STONE MANIPULATION     "laser once" (01/22/2013)  . HEMATOMA EVACUATION Left 05/09/2017   Procedure: EVACUATION HEMATOMA LEFT ARM;  Surgeon: Conrad [place], MD;  Location: [facility];  Service: Vascular;  Laterality: Left;  . HERNIA REPAIR    . INGUINAL HERNIA REPAIR Right 11/06/2015   Procedure: OPEN HERNIA REPAIR  RIGHT INGUINAL ADULT;  Surgeon: [name], MD;  Location: [facility];  Service: General;  Laterality: Right;  with MESH  . INSERTION OF DIALYSIS CATHETER Right 10/05/2016   Procedure: INSERTION OF right internal jugular DIALYSIS CATHETER;  Surgeon: [name], MD;  Location: [facility];  Service: Vascular;  Laterality: Right;  .  INSERTION OF MESH  03/20/2012   Procedure: INSERTION OF MESH;  UMB Surgeon: [name], MD;  Location: [facility];  Service: General;  Laterality: N/A;  . INSERTION OF MESH N/A 01/22/2013   Procedure: INSERTION OF MESH;  Surgeon: [name], MD;  Location: [facility];  Service: General;  Laterality: N/A;  . LAPAROTOMY  04/02/2012   Procedure: EXPLORATORY LAPAROTOMY;  Surgeon: [name], MD;  Location: [facility];  Service: General;  Laterality: N/A;  Exploratory Laparotomy with resection of small intestine  . LEFT HEART CATHETERIZATION WITH CORONARY ANGIOGRAM N/A 05/14/2013   Procedure: LEFT HEART CATHETERIZATION WITH CORONARY ANGIOGRAM;  Surgeon: Sinclair Grooms, MD;  Location: [facility];  Service: Cardiovascular;  Laterality: N/A;  . LIGATION OF ARTERIOVENOUS  FISTULA Left 12/27/2016   Procedure: LIGATION/EXCISION OF LEFT UPPER ARM  ARTERIOVENOUS  FISTULA;  EVACUATION OF HEMATOMA;  Surgeon: Conrad Nash, MD;  Location: [facility];  Service: Vascular;  Laterality: Left;  . LIGATION OF ARTERIOVENOUS  FISTULA Left 03/07/2019   Procedure: LIGATION OF ARTERIOVENOUS FISTULA  LEFT UPPER ARM;  Surgeon: Marty Heck, MD;  Location: [facility];  Service: Vascular;  Laterality: Left;  . REVISON OF ARTERIOVENOUS FISTULA Left 10/05/2016   Procedure: REVISON OF left arm ARTERIOVENOUS FISTULA;  Surgeon: [name], MD;  Location: [facility];  Service: Vascular;  Laterality: Left;  . TONSILLECTOMY    . UMBILICAL HERNIA REPAIR  03/20/2012   Procedure: HERNIA REPAIR UMBILICAL ADULT;  Surgeon: [name], MD;  Location: [facility];  Service: General;  Laterality: N/A;  . UMBILICAL HERNIA REPAIR  01/22/2013   preperitoneal open procedure due to significant adhesions/notes 01/22/2013  . VENTRAL HERNIA REPAIR N/A 01/22/2013   Procedure: ATTEMPTED LAPAROSCOPIC VENTRAL HERNIA CONVERTED TO OPEN;  Surgeon: [name], MD;  [facility];  Service: General;  Laterality: N/A;    FAMILY HISTORY Family History   Problem Relation Age of Onset  . Heart disease Mother   . Hyperlipidemia Mother   . Hypertension Mother   . Kidney disease Father   . Stroke Father   . Kidney disease Brother   . Amblyopia Neg Hx   . Blindness Neg Hx   . Cataracts Neg Hx   . Diabetes Neg Hx   . Glaucoma Neg Hx   . Macular degeneration Neg Hx   . Retinal detachment Neg Hx   . Strabismus Neg Hx   . Retinitis pigmentosa Neg Hx     SOCIAL HISTORY Social History   Tobacco Use  . Smoking status: Never Smoker  . Smokeless tobacco: Never Used  Substance Use Topics  . Alcohol use: No    Alcohol/week: 0.0 standard drinks  . Drug use: No         OPHTHALMIC EXAM:  Base Eye Exam    Visual Acuity (Snellen - Linear)      Right Left   Dist cc 20/25 -2 20/25 -2   Dist ph cc NI NI   Correction: Glasses       Tonometry (Tonopen, 1:38 PM)      Right Left   Pressure 22 17       Pupils      Dark Light Shape React APD   Right 4 3 Round Brisk None   Left 4 3 Round Brisk None       Visual Fields (Counting fingers)      Left Right    Full Full       Extraocular Movement      Right Left    Full, Ortho Full, Ortho       Neuro/Psych    Oriented x3: Yes   Mood/Affect: Normal       Dilation    Both eyes: 1.0% Mydriacyl, 2.5% Phenylephrine @ 1:38 PM          IMAGING AND PROCEDURES  Imaging and Procedures for 08/31/17  OCT, Retina - OU - Both Eyes       Right Eye Quality was good. Central Foveal Thickness: 242. Progression has been stable. Findings include normal foveal contour, no SRF, no IRF, vitreomacular adhesion , inner retinal atrophy (Inner retinal atrophy IT quad caught on widefield).   Left Eye Quality was good. Central Foveal Thickness: 260. Progression has been stable. Findings include normal foveal contour, no SRF, epiretinal membrane, vitreomacular adhesion ,  outer retinal atrophy, intraretinal hyper-reflective material, intraretinal fluid (Persistent cystic changes IN to fovea;  patchy ORA).   Notes *Images captured and stored on drive  Diagnosis / Impression:  OD: NFP, No IRF/SRF; Inner retinal atrophy IT quad caught on widefield - stable OS: trace ERM; Persistent cystic changes IN to fovea; patchy ORA  Clinical management:  See below  Abbreviations: NFP - Normal foveal profile. CME - cystoid macular edema. PED - pigment epithelial detachment. IRF - intraretinal fluid. SRF - subretinal fluid. EZ - ellipsoid zone. ERM - epiretinal membrane. ORA - outer retinal atrophy. ORT - outer retinal tubulation. SRHM - subretinal hyper-reflective material                  ASSESSMENT/PLAN:    ICD-10-CM   1. Branch retinal vein occlusion of left eye with macular edema  H34.8320   2. Retinal edema  H35.81 OCT, Retina - OU - Both Eyes  3. Herpes keratitis  B00.52   4. Lattice degeneration of left retina  H35.412   5. Branch retinal vein occlusion of right eye with macular edema  H34.8310   6. Posterior vitreous detachment of both eyes  H43.813   7. Combined forms of age-related cataract of both eyes  H25.813   8. Anterior uveitis  H20.9   9. Atrophic retinal break, multiple, left eye  H33.332    1,2. Inferior HRVO OS  - just prior to 9.24.20 visit, found to have a clot in fistula, left arm  - exam shows clusters of DBH inferior hemispheric distribution and telangectatic vessels  - s/p IVA OS #1 (09.24.20), #2 (10.22.20), #3 (11.23.20)  - BCVA improved to 20/30 from 20/40  - OCT shows scattered ORA and persistent IRF  - recommend IVA OS #3 today, 11.23.20  - pt wishes to proceed  - RBA of procedure discussed, questions answered  - informed consent obtained and signed  - see procedure note  - F/U 4 week -- DFE/OCT/possible injection  3,4. 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  - likely etiology of prior episodes of Anterior Uveitis OD  - FA on 3.18.19 without posterior involvement, vasculitis or leakage OD  - had recurrent AC cell and  stellate KP on 3.11.2020 - restarted PF QID OD  - today KP and injection stably resolved, only rare pigment remains  - IOP 22 OD, 18 OS today   - cont Pred Forte to QD OD  - given history of repeat recurrences of anterior uveitis despite maintenance acyclovir, may need to be on a maintenance dose of topical steroid  - cont Cosopt BID OU  - epithelial dendrite remains resolved today (on po acyclovir; off topical trifluridine)  - under the expert management of Dr. Kathlen Mody  - continue po acyclovir, 400 mg QD per Dr. Kathlen Mody  5. History of BRVO w/ CME OD  - s/p IVA OD #1 (12.21.18), #2 (01.18.19), #3 (02.14.19), #4 (04.02.19), #5 (05.02.19)  - OCT today with stable improvement in IRF despite no antiVEGF therapy since May 2019 -- resolved  - will continue to hold on injection OD  6,7. Lattice degeneration with atrophic holes OS-   - peripheral holes superiorly at 1030 without SRF or RD  - S/P laser retinopexy OS (01.04.19)  - good laser in place--stable  8. PVD / vitreous syneresis OU  - Discussed findings and prognosis  - No RT or RD on 360 scleral depressed exam  - Reviewed s/s of RT/RD  - Strict return precautions  for any such RT/RD signs/symptoms  9. Combined forms age-related cataract OU-   - The symptoms of cataract, surgical options, and treatments and risks were discussed with patient.  - discussed diagnosis and progression  - not yet visually significant  - monitor for now   Ophthalmic Meds Ordered this visit:  No orders of the defined types were placed in this encounter.      No follow-ups on file.  There are no Patient Instructions on file for this visit.  This document serves as a record of services personally performed by Gardiner Sleeper, MD, PhD. It was created on their behalf by Leeann Must, [place], a certified ophthalmic assistant. The creation of this record is the provider's dictation and/or activities during the visit.    Electronically signed by: Leeann Must, COA @TODAY @ 2:38 PM   absolutely   Gardiner Sleeper, M.D., Ph.D. Diseases & Surgery of the Retina and Vitreous [facility]: M myopia (nearsighted); A astigmatism; H hyperopia (farsighted); P presbyopia; Mrx spectacle prescription;  CTL contact lenses; OD right eye; OS left eye; OU both eyes  XT exotropia; ET esotropia; PEK punctate epithelial keratitis; PEE punctate epithelial erosions; DES dry eye syndrome; MGD meibomian gland dysfunction; ATs artificial tears; PFAT's preservative free artificial tears; [place] nuclear sclerotic cataract; PSC posterior subcapsular cataract; ERM epi-retinal membrane; PVD posterior vitreous detachment; RD retinal detachment; DM diabetes mellitus; DR diabetic retinopathy; NPDR non-proliferative diabetic retinopathy; PDR proliferative diabetic retinopathy; CSME clinically significant macular edema; DME diabetic macular edema; dbh dot blot hemorrhages; CWS cotton wool spot; POAG primary open angle glaucoma; C/D cup-to-disc ratio; HVF humphrey visual field; GVF goldmann visual field; OCT optical coherence tomography; IOP intraocular pressure; BRVO Branch retinal vein occlusion; CRVO central retinal vein occlusion; CRAO central retinal artery occlusion; BRAO branch retinal artery occlusion; RT retinal tear; SB scleral buckle; PPV pars plana vitrectomy; VH Vitreous hemorrhage; PRP panretinal laser photocoagulation; IVK intravitreal kenalog; VMT vitreomacular traction; MH Macular hole;  NVD neovascularization of the disc; NVE neovascularization elsewhere; AREDS age related eye disease study; ARMD age related macular degeneration; POAG primary open angle glaucoma; EBMD epithelial/anterior basement membrane dystrophy; ACIOL anterior chamber intraocular lens; IOL intraocular lens; PCIOL posterior chamber intraocular lens; Phaco/IOL phacoemulsification with intraocular lens placement; [place] photorefractive keratectomy; LASIK laser assisted  in situ keratomileusis; HTN hypertension; DM diabetes mellitus; COPD chronic obstructive pulmonary disease

## 2019-04-11 ENCOUNTER — Ambulatory Visit (HOSPITAL_COMMUNITY)
Admission: RE | Admit: 2019-04-11 | Discharge: 2019-04-11 | Disposition: A | Discharge status: Home / Self Care | Payer: Medicare Other | Attending: Internal Medicine | Admitting: Internal Medicine

## 2019-04-11 ENCOUNTER — Encounter (HOSPITAL_COMMUNITY)
Admission: RE | Disposition: A | Discharge status: Home / Self Care | Payer: Self-pay | Source: Home / Self Care | Attending: Internal Medicine

## 2019-04-11 DIAGNOSIS — Z8249 Family history of ischemic heart disease and other diseases of the circulatory system: Secondary | ICD-10-CM | POA: Diagnosis not present

## 2019-04-11 DIAGNOSIS — Z79899 Other long term (current) drug therapy: Secondary | ICD-10-CM | POA: Diagnosis not present

## 2019-04-11 DIAGNOSIS — Q613 Polycystic kidney, unspecified: Secondary | ICD-10-CM | POA: Diagnosis not present

## 2019-04-11 DIAGNOSIS — I483 Typical atrial flutter: Secondary | ICD-10-CM | POA: Diagnosis not present

## 2019-04-11 DIAGNOSIS — I471 Supraventricular tachycardia: Secondary | ICD-10-CM | POA: Diagnosis present

## 2019-04-11 DIAGNOSIS — Z95828 Presence of other vascular implants and grafts: Secondary | ICD-10-CM | POA: Insufficient documentation

## 2019-04-11 DIAGNOSIS — I428 Other cardiomyopathies: Secondary | ICD-10-CM | POA: Insufficient documentation

## 2019-04-11 DIAGNOSIS — I4719 Other supraventricular tachycardia: Secondary | ICD-10-CM | POA: Diagnosis present

## 2019-04-11 DIAGNOSIS — Z7982 Long term (current) use of aspirin: Secondary | ICD-10-CM | POA: Diagnosis not present

## 2019-04-11 DIAGNOSIS — I120 Hypertensive chronic kidney disease with stage 5 chronic kidney disease or end stage renal disease: Secondary | ICD-10-CM | POA: Insufficient documentation

## 2019-04-11 DIAGNOSIS — G4733 Obstructive sleep apnea (adult) (pediatric): Secondary | ICD-10-CM | POA: Insufficient documentation

## 2019-04-11 DIAGNOSIS — Z841 Family history of disorders of kidney and ureter: Secondary | ICD-10-CM | POA: Diagnosis not present

## 2019-04-11 DIAGNOSIS — N186 End stage renal disease: Secondary | ICD-10-CM | POA: Diagnosis not present

## 2019-04-11 DIAGNOSIS — N2581 Secondary hyperparathyroidism of renal origin: Secondary | ICD-10-CM | POA: Insufficient documentation

## 2019-04-11 DIAGNOSIS — Z992 Dependence on renal dialysis: Secondary | ICD-10-CM | POA: Diagnosis not present

## 2019-04-11 DIAGNOSIS — Z20828 Contact with and (suspected) exposure to other viral communicable diseases: Secondary | ICD-10-CM | POA: Diagnosis not present

## 2019-04-11 DIAGNOSIS — E785 Hyperlipidemia, unspecified: Secondary | ICD-10-CM | POA: Insufficient documentation

## 2019-04-11 HISTORY — PX: A-FLUTTER ABLATION: EP1230

## 2019-04-11 LAB — RESPIRATORY PANEL BY RT PCR (FLU A&B, COVID)
Influenza A by PCR: NEGATIVE
Influenza B by PCR: NEGATIVE
SARS Coronavirus 2 by RT PCR: NEGATIVE

## 2019-04-11 SURGERY — A-FLUTTER ABLATION

## 2019-04-11 MED ORDER — BUPIVACAINE HCL (PF) 0.25 % IJ SOLN
INTRAMUSCULAR | Status: AC
  Filled 2019-04-11: qty 60

## 2019-04-11 MED ORDER — METOPROLOL TARTRATE 5 MG/5ML IV SOLN
INTRAVENOUS | Status: AC
  Filled 2019-04-11: qty 5

## 2019-04-11 MED ORDER — MIDAZOLAM HCL 5 MG/5ML IJ SOLN
INTRAMUSCULAR | Status: AC
  Filled 2019-04-11: qty 5

## 2019-04-11 MED ORDER — FENTANYL CITRATE (PF) 100 MCG/2ML IJ SOLN
INTRAMUSCULAR | Status: AC
  Filled 2019-04-11: qty 2

## 2019-04-11 MED ORDER — SODIUM CHLORIDE 0.9 % IV SOLN: 250.0000 mL | INTRAVENOUS | Status: DC | PRN

## 2019-04-11 MED ORDER — MIDAZOLAM HCL 5 MG/5ML IJ SOLN
INTRAMUSCULAR | Status: DC | PRN
  Administered 2019-04-11 (×3): 1 mg via INTRAVENOUS
  Administered 2019-04-11: 2 mg via INTRAVENOUS

## 2019-04-11 MED ORDER — FENTANYL CITRATE (PF) 100 MCG/2ML IJ SOLN
INTRAMUSCULAR | Status: DC | PRN
  Administered 2019-04-11 (×2): 25 ug via INTRAVENOUS
  Administered 2019-04-11: 12.5 ug via INTRAVENOUS

## 2019-04-11 MED ORDER — BUPIVACAINE HCL (PF) 0.25 % IJ SOLN
INTRAMUSCULAR | Status: DC | PRN
  Administered 2019-04-11: 30 mL

## 2019-04-11 MED ORDER — HEPARIN (PORCINE) IN NACL 1000-0.9 UT/500ML-% IV SOLN
INTRAVENOUS | Status: DC | PRN
  Administered 2019-04-11: 500 mL

## 2019-04-11 MED ORDER — ACETAMINOPHEN 325 MG PO TABS: 650.0000 mg | ORAL_TABLET | ORAL | Status: DC | PRN

## 2019-04-11 MED ORDER — SODIUM CHLORIDE 0.9% FLUSH: 3.0000 mL | INTRAVENOUS | Status: DC | PRN

## 2019-04-11 MED ORDER — METOPROLOL TARTRATE 5 MG/5ML IV SOLN
INTRAVENOUS | Status: DC | PRN
  Administered 2019-04-11: 5 mg via INTRAVENOUS

## 2019-04-11 MED ORDER — HEPARIN SODIUM (PORCINE) 1000 UNIT/ML IJ SOLN
INTRAMUSCULAR | Status: AC
  Filled 2019-04-11: qty 1

## 2019-04-11 MED ORDER — ONDANSETRON HCL 4 MG/2ML IJ SOLN: 4.0000 mg | Freq: Four times a day (QID) | INTRAMUSCULAR | Status: DC | PRN

## 2019-04-11 MED ORDER — SODIUM CHLORIDE 0.9% FLUSH: 3.0000 mL | Freq: Two times a day (BID) | INTRAVENOUS | Status: DC

## 2019-04-11 MED ORDER — SODIUM CHLORIDE 0.9 % IV SOLN: INTRAVENOUS | Status: DC

## 2019-04-11 SURGICAL SUPPLY — 13 items
BAG SNAP BAND KOVER 36X36 (MISCELLANEOUS) ×1 IMPLANT
CATH DUODECA HALO/ISMUS 7FR (CATHETERS) ×1 IMPLANT
CATH EZ STEER NAV 8MM F-J CUR (ABLATOR) ×1 IMPLANT
CATH JOSEPH QUAD ALLRED 6F REP (CATHETERS) ×1 IMPLANT
CATH POLARIS X 2.5/5/2.5 DECAP (CATHETERS) ×1 IMPLANT
PACK EP LATEX FREE (CUSTOM PROCEDURE TRAY) ×2
PACK EP LF (CUSTOM PROCEDURE TRAY) ×1 IMPLANT
PAD PRO RADIOLUCENT 2001M-C (PAD) ×2 IMPLANT
PATCH CARTO3 (PAD) ×1 IMPLANT
SHEATH PINNACLE 6F 10CM (SHEATH) ×1 IMPLANT
SHEATH PINNACLE 7F 10CM (SHEATH) ×1 IMPLANT
SHEATH PINNACLE 8F 10CM (SHEATH) ×2 IMPLANT
TUBING SMART ABLATE COOLFLOW (TUBING) ×1 IMPLANT

## 2019-04-11 NOTE — Interval H&P Note (Signed)
History and Physical Interval Note:  04/11/2019 11:22 AM  Michael [name].  has presented today for surgery, with the diagnosis of aflutter.  The various methods of treatment have been discussed with the patient and family. After consideration of risks, benefits and other options for treatment, the patient has consented to  Procedure(s): A-FLUTTER ABLATION (N/A) as a surgical intervention.  The patient's history has been reviewed, patient examined, no change in status, stable for surgery.  I have reviewed the patient's chart and labs.  Questions were answered to the patient's satisfaction.     Cristopher Peru

## 2019-04-11 NOTE — Discharge Instructions (Signed)
Post procedure care instructions No driving for 4 days. No lifting over 5 lbs for 1 week. No vigorous or sexual activity for 1 week. You may return to work/your usual activities on 04/18/2019. Keep procedure site clean & dry. If you notice increased pain, swelling, bleeding or pus, call/return!  You may shower, but no soaking baths/hot tubs/pools for 1 week.    Cardiac Ablation, Care After This sheet gives you information about how to care for yourself after your procedure. Your health care provider may also give you more specific instructions. If you have problems or questions, contact your health care provider. What can I expect after the procedure? After the procedure, it is common to have:  Bruising around your puncture site.  Tenderness around your puncture site.  Skipped heartbeats.  Tiredness (fatigue). Follow these instructions at home: Puncture site care   Follow instructions from your health care provider about how to take care of your puncture site. Make sure you: ? Wash your hands with soap and water before you change your bandage (dressing). If soap and water are not available, use hand sanitizer. ? Change your dressing as told by your health care provider. ? Leave stitches (sutures), skin glue, or adhesive strips in place. These skin closures may need to stay in place for up to 2 weeks. If adhesive strip edges start to loosen and curl up, you may trim the loose edges. Do not remove adhesive strips completely unless your health care provider tells you to do that.  Check your puncture site every day for signs of infection. Check for: ? Redness, swelling, or pain. ? Fluid or blood. If your puncture site starts to bleed, lie down on your back, apply firm pressure to the area, and contact your health care provider. ? Warmth. ? Pus or a bad smell. Driving  Ask your health care provider when it is safe for you to drive again after the procedure.  Do not drive or use heavy  machinery while taking prescription pain medicine.  Do not drive for 24 hours if you were given a medicine to help you relax (sedative) during your procedure. Activity  Avoid activities that take a lot of effort for at least 3 days after your procedure.  Do not lift anything that is heavier than 10 lb (4.5 kg), or the limit that you are told, until your health care provider says that it is safe.  Return to your normal activities as told by your health care provider. Ask your health care provider what activities are safe for you. General instructions  Take over-the-counter and prescription medicines only as told by your health care provider.  Do not use any products that contain nicotine or tobacco, such as cigarettes and e-cigarettes. If you need help quitting, ask your health care provider.  Do not take baths, swim, or use a hot tub until your health care provider approves.  Do not drink alcohol for 24 hours after your procedure.  Keep all follow-up visits as told by your health care provider. This is important. Contact a health care provider if:  You have redness, mild swelling, or pain around your puncture site.  You have fluid or blood coming from your puncture site that stops after applying firm pressure to the area.  Your puncture site feels warm to the touch.  You have pus or a bad smell coming from your puncture site.  You have a fever.  You have chest pain or discomfort that spreads to your  neck, jaw, or arm.  You are sweating a lot.  You feel nauseous.  You have a fast or irregular heartbeat.  You have shortness of breath.  You are dizzy or light-headed and feel the need to lie down.  You have pain or numbness in the arm or leg closest to your puncture site. Get help right away if:  Your puncture site suddenly swells.  Your puncture site is bleeding and the bleeding does not stop after applying firm pressure to the area. These symptoms may represent a  serious problem that is an emergency. Do not wait to see if the symptoms will go away. Get medical help right away. Call your local emergency services (911 in the [place] Do not drive yourself to the hospital. Summary  After the procedure, it is normal to have bruising and tenderness at the puncture site in your groin, neck, or forearm.  Check your puncture site every day for signs of infection.  Get help right away if your puncture site is bleeding and the bleeding does not stop after applying firm pressure to the area. This is a medical emergency. This information is not intended to replace advice given to you by your health care provider. Make sure you discuss any questions you have with your health care provider. Document Released: 07/28/2016 Document Revised: 03/31/2017 Document Reviewed: 07/28/2016 Elsevier Patient Education  2020 [employer].

## 2019-04-11 NOTE — Interval H&P Note (Signed)
History and Physical Interval Note:  04/11/2019 11:18 AM  Michael [name].  has presented today for surgery, with the diagnosis of aflutter.  The various methods of treatment have been discussed with the patient and family. After consideration of risks, benefits and other options for treatment, the patient has consented to  Procedure(s): A-FLUTTER ABLATION (N/A) as a surgical intervention.  The patient's history has been reviewed, patient examined, no change in status, stable for surgery.  I have reviewed the patient's chart and labs.  Questions were answered to the patient's satisfaction.     Cristopher Peru

## 2019-04-11 NOTE — Progress Notes (Signed)
Up and walked and tolerated well; right groin stable, no bleeding or hematoma 

## 2019-04-22 ENCOUNTER — Encounter (INDEPENDENT_AMBULATORY_CARE_PROVIDER_SITE_OTHER): Payer: Self-pay | Admitting: Ophthalmology

## 2019-04-22 ENCOUNTER — Other Ambulatory Visit: Payer: Self-pay

## 2019-04-22 ENCOUNTER — Encounter (INDEPENDENT_AMBULATORY_CARE_PROVIDER_SITE_OTHER): Payer: Medicare Other | Admitting: Ophthalmology

## 2019-04-22 ENCOUNTER — Telehealth (HOSPITAL_COMMUNITY): Payer: Self-pay | Admitting: *Deleted

## 2019-04-22 DIAGNOSIS — Z992 Dependence on renal dialysis: Secondary | ICD-10-CM

## 2019-04-22 DIAGNOSIS — H43813 Vitreous degeneration, bilateral: Secondary | ICD-10-CM

## 2019-04-22 DIAGNOSIS — H209 Unspecified iridocyclitis: Secondary | ICD-10-CM

## 2019-04-22 DIAGNOSIS — N186 End stage renal disease: Secondary | ICD-10-CM

## 2019-04-22 DIAGNOSIS — H25813 Combined forms of age-related cataract, bilateral: Secondary | ICD-10-CM

## 2019-04-22 DIAGNOSIS — H34832 Tributary (branch) retinal vein occlusion, left eye, with macular edema: Secondary | ICD-10-CM

## 2019-04-22 DIAGNOSIS — B0052 Herpesviral keratitis: Secondary | ICD-10-CM

## 2019-04-22 DIAGNOSIS — H33332 Multiple defects of retina without detachment, left eye: Secondary | ICD-10-CM

## 2019-04-22 DIAGNOSIS — H34831 Tributary (branch) retinal vein occlusion, right eye, with macular edema: Secondary | ICD-10-CM

## 2019-04-22 DIAGNOSIS — H35412 Lattice degeneration of retina, left eye: Secondary | ICD-10-CM

## 2019-04-22 DIAGNOSIS — H3581 Retinal edema: Secondary | ICD-10-CM

## 2019-04-22 NOTE — Telephone Encounter (Signed)

## 2019-04-23 ENCOUNTER — Ambulatory Visit (HOSPITAL_COMMUNITY)
Admission: RE | Admit: 2019-04-23 | Discharge: 2019-04-23 | Disposition: A | Discharge status: Home / Self Care | Payer: Medicare Other | Source: Ambulatory Visit | Attending: Family | Admitting: Family

## 2019-04-23 ENCOUNTER — Other Ambulatory Visit: Payer: Self-pay

## 2019-04-23 ENCOUNTER — Ambulatory Visit (INDEPENDENT_AMBULATORY_CARE_PROVIDER_SITE_OTHER): Payer: Self-pay | Admitting: Physician Assistant

## 2019-04-23 VITALS — BP 122/90 | HR 80 | Temp 98.3°F | Resp 16 | Ht 63.0 in | Wt 182.0 lb

## 2019-04-23 DIAGNOSIS — Z992 Dependence on renal dialysis: Secondary | ICD-10-CM | POA: Diagnosis present

## 2019-04-23 DIAGNOSIS — N186 End stage renal disease: Secondary | ICD-10-CM | POA: Diagnosis present

## 2019-04-23 NOTE — Progress Notes (Signed)
Patient ID: Michael Ching., male   DOB: 1955/10/18, 63 y.o.   [name]: [id number]  Reason for Consult: Chronic Kidney Disease (R Dialysis Duplex, S/p 1st stage AVF. )   Referred by [name], MD  Subjective:     HPI:  Michael Godinho. is a 63 y.o. male s/p Right 1st stage brachiobasilic vein transposition placed 03/07/2019, Dr. Carlis Abbott.  He denies RUE/hand pain. No numbness or tingling.  He dialyzes via right internal juglular TDC. He denies pain or problems with catheter. No fever or chills.  Past Medical History:  Diagnosis Date  . Atrial arrhythmia    atrial tachycardia with variable AV conduction versus atypical aflutter 01/10/19, rate control (02/06/19)  . Cataract   . Dyspnea    on exertion  . ESRD (end stage renal disease) ([place])    Hemo- MWF, Polycystic kidney disease  . Fatigue   . History of kidney stones    removal of stone- cysto  . Hyperlipidemia   . Hyperparathyroidism, secondary renal ([place])   . Hypertension   . Hypoxemia 12/12/2013  . Nonischemic cardiomyopathy ([place])    Er 25% 2015, 55 % 2013  . OSA on CPAP    no longer using cpap  . OSA on CPAP 03/24/2014  . Pneumonia    2015ish  . Ventricular tachycardia//Freq PVCs   . Wears glasses    Family History  Problem Relation Age of Onset  . Heart disease Mother   . Hyperlipidemia Mother   . Hypertension Mother   . Kidney disease Father   . Stroke Father   . Kidney disease Brother   . Amblyopia Neg Hx   . Blindness Neg Hx   . Cataracts Neg Hx   . Diabetes Neg Hx   . Glaucoma Neg Hx   . Macular degeneration Neg Hx   . Retinal detachment Neg Hx   . Strabismus Neg Hx   . Retinitis pigmentosa Neg Hx    Past Surgical History:  Procedure Laterality Date  . A-FLUTTER ABLATION N/A 04/11/2019   Procedure: A-FLUTTER ABLATION;  Surgeon: Evans Lance, MD;  Location: [place] [facility];  Service: Cardiovascular;  Laterality: N/A;  . A/V FISTULAGRAM Left 04/27/2017   Procedure: A/V FISTULAGRAM;  Surgeon:  Conrad Modale, MD;  Location: [place] [facility];  Service: Cardiovascular;  Laterality: Left;  lt arm  . A/V FISTULAGRAM Left 01/10/2019   Procedure: A/V FISTULAGRAM;  Surgeon: Marty Heck, MD;  Location: [place] [facility];  Service: Cardiovascular;  Laterality: Left;  . APPENDECTOMY    . AV FISTULA PLACEMENT  12/05/2011   Procedure: ARTERIOVENOUS (AV) FISTULA CREATION;LLEFT ARM  Surgeon: Conrad [place], MD;  Location: [place];  Service: Vascular;  Laterality: Left;  RADIO-CEPHALIC  fistula left arm  . AV FISTULA PLACEMENT  01/11/2012   Procedure: ARTERIOVENOUS (AV) FISTULA CREATION;  Surgeon: Conrad Dillon, MD;  Location: [place];  Service: Vascular;  Laterality: Left;  Creation of left brachial cephalic arteriovenous fistula  . AV FISTULA PLACEMENT Right 03/07/2019   Procedure: ARTERIOVENOUS (AV) FISTULA CREATION  RIGHT ARM;  Surgeon: Marty Heck, MD;  Location: [place];  Service: Vascular;  Laterality: Right;  . BASCILIC VEIN TRANSPOSITION Left 12/27/2016   Procedure: BASILIC VEIN TRANSPOSITION LEFT UPPER ARM FIRST STAGE;  Surgeon: Conrad Coffee, MD;  Location: [place];  Service: Vascular;  Laterality: Left;  . BASCILIC VEIN TRANSPOSITION Left 01/31/2017   Procedure: LEFT ARM BASILIC VEIN TRANSPOSITION, SECOND STAGE;  Surgeon: Conrad [place], MD;  Location: [place];  Service: Vascular;  Laterality: Left;  . CARDIAC CATHETERIZATION  04-05-2010   checking for blockage but none-[facility]  . COLONOSCOPY    . CYSTOSCOPY W/ STONE MANIPULATION     "laser once" (01/22/2013)  . HEMATOMA EVACUATION Left 05/09/2017   Procedure: EVACUATION HEMATOMA LEFT ARM;  Surgeon: Conrad Ihlen, MD;  Location: [place];  Service: Vascular;  Laterality: Left;  . HERNIA REPAIR    . INGUINAL HERNIA REPAIR Right 11/06/2015   Procedure: OPEN HERNIA REPAIR  RIGHT INGUINAL ADULT;  Surgeon: [name], MD;  Location: [facility];  Service: General;  Laterality: Right;  with MESH  . INSERTION OF DIALYSIS CATHETER Right 10/05/2016    Procedure: INSERTION OF right internal jugular DIALYSIS CATHETER;  Surgeon: [name], MD;  Location: [place];  Service: Vascular;  Laterality: Right;  . INSERTION OF MESH  03/20/2012   Procedure: INSERTION OF MESH;  UMB Surgeon: [name], MD;  Location: [place];  Service: General;  Laterality: N/A;  . INSERTION OF MESH N/A 01/22/2013   Procedure: INSERTION OF MESH;  Surgeon: [name], MD;  Location: [place];  Service: General;  Laterality: N/A;  . LAPAROTOMY  04/02/2012   Procedure: EXPLORATORY LAPAROTOMY;  Surgeon: [name], MD;  Location: [place];  Service: General;  Laterality: N/A;  Exploratory Laparotomy with resection of small intestine  . LEFT HEART CATHETERIZATION WITH CORONARY ANGIOGRAM N/A 05/14/2013   Procedure: LEFT HEART CATHETERIZATION WITH CORONARY ANGIOGRAM;  Surgeon: Sinclair Grooms, MD;  Location: [facility];  Service: Cardiovascular;  Laterality: N/A;  . LIGATION OF ARTERIOVENOUS  FISTULA Left 12/27/2016   Procedure: LIGATION/EXCISION OF LEFT UPPER ARM ARTERIOVENOUS  FISTULA;  EVACUATION OF HEMATOMA;  Surgeon: Conrad [place], MD;  Location: [place];  Service: Vascular;  Laterality: Left;  . LIGATION OF ARTERIOVENOUS  FISTULA Left 03/07/2019   Procedure: LIGATION OF ARTERIOVENOUS FISTULA  LEFT UPPER ARM;  Surgeon: Marty Heck, MD;  Location: [place];  Service: Vascular;  Laterality: Left;  . REVISON OF ARTERIOVENOUS FISTULA Left 10/05/2016   Procedure: REVISON OF left arm ARTERIOVENOUS FISTULA;  Surgeon: [name], MD;  Location: [place];  Service: Vascular;  Laterality: Left;  . TONSILLECTOMY    . UMBILICAL HERNIA REPAIR  03/20/2012   Procedure: HERNIA REPAIR UMBILICAL ADULT;  Surgeon: [name], MD;  Location: [place];  Service: General;  Laterality: N/A;  . UMBILICAL HERNIA REPAIR  01/22/2013   preperitoneal open procedure due to significant adhesions/notes 01/22/2013  . VENTRAL HERNIA REPAIR N/A 01/22/2013   Procedure: ATTEMPTED LAPAROSCOPIC  VENTRAL HERNIA CONVERTED TO OPEN;  Surgeon: [name], MD;  Location: [place];  Service: General;  Laterality: N/A;    Short Social History:  Social History   Tobacco Use  . Smoking status: Never Smoker  . Smokeless tobacco: Never Used  Substance Use Topics  . Alcohol use: No    Alcohol/week: 0.0 standard drinks    No Known Allergies  Current Outpatient Medications  Medication Sig Dispense Refill  . acyclovir (ZOVIRAX) 400 MG tablet Take 400 mg by mouth every evening.     [name] aspirin EC 81 MG EC tablet Take 1 tablet (81 mg total) by mouth daily. (Patient taking differently: Take 81 mg by mouth every evening. )    . atorvastatin (LIPITOR) 40 MG tablet Take 40 mg by mouth every evening.     . dorzolamide-timolol (COSOPT) 22.3-6.8 MG/ML ophthalmic solution Place  1 drop into both eyes 2 (two) times daily. 10 mL 10  . HYDROcodone-acetaminophen (NORCO) 5-325 MG tablet Take 1 tablet by mouth every 6 (six) hours as needed for moderate pain. 20 tablet 0  . nitroGLYCERIN (NITROSTAT) 0.4 MG SL tablet Place 1 tablet (0.4 mg total) under the tongue every 5 (five) minutes x 3 doses as needed for chest pain. 30 tablet 12  . prednisoLONE acetate (PRED FORTE) 1 % ophthalmic suspension Place 1 drop into the right eye daily at 12 noon.     . sevelamer carbonate (RENVELA) 800 MG tablet Take 1,600 mg by mouth 3 (three) times daily with meals.     No current facility-administered medications for this visit.    REVIEW OF SYSTEMS      Objective:  Objective   Vitals:   04/23/19 1254  BP: 122/90  Pulse: 80  Resp: 16  Temp: 98.3 F (36.8 C)  TempSrc: Oral  SpO2: 100%  Weight: 182 lb (82.6 kg)  Height: 5\' 3"  (1.6 m)   Body mass index is 32.24 kg/m.  Physical Exam: WD, WN male in NAD A and O x 4 RRR RUE: No edema. Incision well healed. Good thrill and bruit in fistula.  Fistula is not easily palpated due to size and depth. Normal grip strength. 2+ radial pulse   Summary: Patent  fistula with no visualized stenosis.  Immature nature of vein after 6 weeks   Data: Findings: +--------------------+----------+-----------------+--------+ AVF                 PSV (cm/s)Flow Vol (mL/min)Comments +--------------------+----------+-----------------+--------+ Native artery inflow   130          1138                +--------------------+----------+-----------------+--------+ AVF Anastomosis        589                              +--------------------+----------+-----------------+--------+    +------------+----------+-------------+----------+--------+ OUTFLOW VEINPSV (cm/s)Diameter (cm)Depth (cm)Describe +------------+----------+-------------+----------+--------+ Mid UA         224        0.43        0.47            +------------+----------+-------------+----------+--------+ Dist UA        265        0.36        0.50            +------------+----------+-------------+----------+--------+ AC Fossa       585        0.27        0.57            +------------+----------+-------------+----------+--------+        Assessment/Plan:     Immature stage brachiobasilic A-V fistula.  We discussed ultrasound and PE findings.  REC: possible superficialization versus placement of AV graft with Dr. Carlis Abbott.  The vein has not matured well and the size was small to start with.   We discussed risks and rationale of the procedure and the possibility of graft failure.     Risa Grill, PA-C [facility]  clinic MD: Dr. Donnetta Hutching

## 2019-04-24 ENCOUNTER — Other Ambulatory Visit: Payer: Self-pay

## 2019-05-04 DIAGNOSIS — E785 Hyperlipidemia, unspecified: Secondary | ICD-10-CM | POA: Diagnosis not present

## 2019-05-04 DIAGNOSIS — Z992 Dependence on renal dialysis: Secondary | ICD-10-CM | POA: Diagnosis not present

## 2019-05-04 DIAGNOSIS — N186 End stage renal disease: Secondary | ICD-10-CM | POA: Diagnosis not present

## 2019-05-04 DIAGNOSIS — N2581 Secondary hyperparathyroidism of renal origin: Secondary | ICD-10-CM | POA: Diagnosis not present

## 2019-05-04 DIAGNOSIS — D509 Iron deficiency anemia, unspecified: Secondary | ICD-10-CM | POA: Diagnosis not present

## 2019-05-05 DIAGNOSIS — D509 Iron deficiency anemia, unspecified: Secondary | ICD-10-CM | POA: Diagnosis not present

## 2019-05-05 DIAGNOSIS — E785 Hyperlipidemia, unspecified: Secondary | ICD-10-CM | POA: Diagnosis not present

## 2019-05-05 DIAGNOSIS — N186 End stage renal disease: Secondary | ICD-10-CM | POA: Diagnosis not present

## 2019-05-05 DIAGNOSIS — Z992 Dependence on renal dialysis: Secondary | ICD-10-CM | POA: Diagnosis not present

## 2019-05-05 DIAGNOSIS — N2581 Secondary hyperparathyroidism of renal origin: Secondary | ICD-10-CM | POA: Diagnosis not present

## 2019-05-06 DIAGNOSIS — D509 Iron deficiency anemia, unspecified: Secondary | ICD-10-CM | POA: Diagnosis not present

## 2019-05-06 DIAGNOSIS — Z992 Dependence on renal dialysis: Secondary | ICD-10-CM | POA: Diagnosis not present

## 2019-05-06 DIAGNOSIS — E785 Hyperlipidemia, unspecified: Secondary | ICD-10-CM | POA: Diagnosis not present

## 2019-05-06 DIAGNOSIS — N186 End stage renal disease: Secondary | ICD-10-CM | POA: Diagnosis not present

## 2019-05-06 DIAGNOSIS — N2581 Secondary hyperparathyroidism of renal origin: Secondary | ICD-10-CM | POA: Diagnosis not present

## 2019-05-08 DIAGNOSIS — E785 Hyperlipidemia, unspecified: Secondary | ICD-10-CM | POA: Diagnosis not present

## 2019-05-08 DIAGNOSIS — D509 Iron deficiency anemia, unspecified: Secondary | ICD-10-CM | POA: Diagnosis not present

## 2019-05-08 DIAGNOSIS — N2581 Secondary hyperparathyroidism of renal origin: Secondary | ICD-10-CM | POA: Diagnosis not present

## 2019-05-08 DIAGNOSIS — N186 End stage renal disease: Secondary | ICD-10-CM | POA: Diagnosis not present

## 2019-05-08 DIAGNOSIS — Z992 Dependence on renal dialysis: Secondary | ICD-10-CM | POA: Diagnosis not present

## 2019-05-08 NOTE — Progress Notes (Addendum)
[facility] Note  05/16/2019     CHIEF COMPLAINT Patient presents for Retina Follow Up   HISTORY OF PRESENT ILLNESS: Michael [name]. is a 64 y.o. male who presents to the clinic today for:   HPI    Retina Follow Up    Patient presents with  CRVO/BRVO.  In left eye.  This started 4 weeks ago.  Severity is moderate.  I, the attending physician,  performed the HPI with the patient and updated documentation appropriately.          Comments    Patient here for 4 weeks retina follow up for HRVO OS. Patient states vision doing pretty good. No eye pain.        Last edited by [name], MD on 05/16/2019  9:19 AM. (History)    pt is feeling much better today, he is using PF QD OD and Cosopt BID OU, pt is having sx at the end of the month to have a stent put in his arm  Referring physician: [name], MD [place],  [place] [address]  HISTORICAL INFORMATION:   Selected notes from the MEDICAL RECORD NUMBER Referred by Dr. [name]. Hecker for concern of HRVO OD; LEE- 11.30.18 ([name]) {BCVA OD: 20/100-1 OS: 20/20-2] Ocular Hx- cataract OU PMH- HTN, high chol, kidney disease, sleep apnea, emphysema    CURRENT MEDICATIONS: Current Outpatient Medications (Ophthalmic Drugs)  Medication Sig  . dorzolamide-timolol (COSOPT) 22.3-6.8 MG/ML ophthalmic solution Place 1 drop into both eyes 2 (two) times daily.  . prednisoLONE acetate (PRED FORTE) 1 % ophthalmic suspension Place 1 drop into the right eye daily at 12 noon.    No current facility-administered medications for this visit. (Ophthalmic Drugs)   Current Outpatient Medications (Other)  Medication Sig  . acyclovir (ZOVIRAX) 400 MG tablet Take 400 mg by mouth every evening.   [name] aspirin EC 81 MG EC tablet Take 1 tablet (81 mg total) by mouth daily. (Patient taking differently: Take 81 mg by mouth every evening. )  . atorvastatin (LIPITOR) 40 MG tablet Take 40 mg by mouth  every evening.   [name] HYDROcodone-acetaminophen (NORCO) 5-325 MG tablet Take 1 tablet by mouth every 6 (six) hours as needed for moderate pain.  . nitroGLYCERIN (NITROSTAT) 0.4 MG SL tablet Place 1 tablet (0.4 mg total) under the tongue every 5 (five) minutes x 3 doses as needed for chest pain.  . sevelamer carbonate (RENVELA) 800 MG tablet Take 1,600 mg by mouth 3 (three) times daily with meals.   No current facility-administered medications for this visit. (Other)      REVIEW OF SYSTEMS: ROS    Positive for: Genitourinary, Endocrine, Cardiovascular   Negative for: Constitutional, Gastrointestinal, Neurological, Skin, Musculoskeletal, HENT, Eyes, Respiratory, Psychiatric, Allergic/Imm, Heme/Lymph   Last edited by [name], MD on 05/16/2019  9:45 AM. (History)       ALLERGIES No Known Allergies  PAST MEDICAL HISTORY Past Medical History:  Diagnosis Date  . Atrial arrhythmia    atrial tachycardia with variable AV conduction versus atypical aflutter 01/10/19, rate control (02/06/19)  . Cataract   . Dyspnea    on exertion  . ESRD (end stage renal disease) ([place])    Hemo- MWF, Polycystic kidney disease  . Fatigue   . History of kidney stones    removal of stone- cysto  . Hyperlipidemia   . Hyperparathyroidism, secondary renal ([place])   . Hypertension   . Hypoxemia  12/12/2013  . Nonischemic cardiomyopathy ([place])    Er 25% 2015, 55 % 2013  . OSA on CPAP    no longer using cpap  . OSA on CPAP 03/24/2014  . Pneumonia    2015ish  . Ventricular tachycardia//Freq PVCs   . Wears glasses    Past Surgical History:  Procedure Laterality Date  . A-FLUTTER ABLATION N/A 04/11/2019   Procedure: A-FLUTTER ABLATION;  Surgeon: Evans Lance, MD;  Location: [place] [facility];  Service: Cardiovascular;  Laterality: N/A;  . A/V FISTULAGRAM Left 04/27/2017   Procedure: A/V FISTULAGRAM;  Surgeon: Conrad [place], MD;  Location: [place];  Service: Cardiovascular;  Laterality: Left;   lt arm  . A/V FISTULAGRAM Left 01/10/2019   Procedure: A/V FISTULAGRAM;  Surgeon: Marty Heck, MD;  Location: [place] [facility];  Service: Cardiovascular;  Laterality: Left;  . APPENDECTOMY    . AV FISTULA PLACEMENT  12/05/2011   Procedure: ARTERIOVENOUS (AV) FISTULA CREATION;LLEFT ARM  Surgeon: Conrad Stowell, MD;  Location: [place];  Service: Vascular;  Laterality: Left;  RADIO-CEPHALIC  fistula left arm  . AV FISTULA PLACEMENT  01/11/2012   Procedure: ARTERIOVENOUS (AV) FISTULA CREATION;  Surgeon: Conrad Seltzer, MD;  Location: [place];  Service: Vascular;  Laterality: Left;  Creation of left brachial cephalic arteriovenous fistula  . AV FISTULA PLACEMENT Right 03/07/2019   Procedure: ARTERIOVENOUS (AV) FISTULA CREATION  RIGHT ARM;  Surgeon: Marty Heck, MD;  Location: [place];  Service: Vascular;  Laterality: Right;  . BASCILIC VEIN TRANSPOSITION Left 12/27/2016   Procedure: BASILIC VEIN TRANSPOSITION LEFT UPPER ARM FIRST STAGE;  Surgeon: Conrad [place], MD;  Location: [place];  Service: Vascular;  Laterality: Left;  . BASCILIC VEIN TRANSPOSITION Left 01/31/2017   Procedure: LEFT ARM BASILIC VEIN TRANSPOSITION, SECOND STAGE;  Surgeon: Conrad [place], MD;  Location: [place];  Service: Vascular;  Laterality: Left;  . CARDIAC CATHETERIZATION  04-05-2010   checking for blockage but none-[facility]  . COLONOSCOPY    . CYSTOSCOPY W/ STONE MANIPULATION     "laser once" (01/22/2013)  . HEMATOMA EVACUATION Left 05/09/2017   Procedure: EVACUATION HEMATOMA LEFT ARM;  Surgeon: Conrad Kreamer, MD;  Location: [place];  Service: Vascular;  Laterality: Left;  . HERNIA REPAIR    . INGUINAL HERNIA REPAIR Right 11/06/2015   Procedure: OPEN HERNIA REPAIR  RIGHT INGUINAL ADULT;  Surgeon: [name], MD;  Location: [facility];  Service: General;  Laterality: Right;  with MESH  . INSERTION OF DIALYSIS CATHETER Right 10/05/2016   Procedure: INSERTION OF right internal jugular DIALYSIS CATHETER;  Surgeon: [name], MD;   Location: [place];  Service: Vascular;  Laterality: Right;  . INSERTION OF MESH  03/20/2012   Procedure: INSERTION OF MESH;  UMB Surgeon: [name], MD;  Location: [place];  Service: General;  Laterality: N/A;  . INSERTION OF MESH N/A 01/22/2013   Procedure: INSERTION OF MESH;  Surgeon: [name], MD;  Location: [place];  Service: General;  Laterality: N/A;  . LAPAROTOMY  04/02/2012   Procedure: EXPLORATORY LAPAROTOMY;  Surgeon: [name], MD;  Location: [place];  Service: General;  Laterality: N/A;  Exploratory Laparotomy with resection of small intestine  . LEFT HEART CATHETERIZATION WITH CORONARY ANGIOGRAM N/A 05/14/2013   Procedure: LEFT HEART CATHETERIZATION WITH CORONARY ANGIOGRAM;  Surgeon: Sinclair Grooms, MD;  Location: [facility];  Service: Cardiovascular;  Laterality: N/A;  . LIGATION OF ARTERIOVENOUS  FISTULA Left 12/27/2016   Procedure: LIGATION/EXCISION OF LEFT UPPER ARM ARTERIOVENOUS  FISTULA;  EVACUATION OF HEMATOMA;  Surgeon: Conrad Belfonte, MD;  Location: [place];  Service: Vascular;  Laterality: Left;  . LIGATION OF ARTERIOVENOUS  FISTULA Left 03/07/2019   Procedure: LIGATION OF ARTERIOVENOUS FISTULA  LEFT UPPER ARM;  Surgeon: Marty Heck, MD;  Location: [place];  Service: Vascular;  Laterality: Left;  . REVISON OF ARTERIOVENOUS FISTULA Left 10/05/2016   Procedure: REVISON OF left arm ARTERIOVENOUS FISTULA;  Surgeon: [name], MD;  Location: [place];  Service: Vascular;  Laterality: Left;  . TONSILLECTOMY    . UMBILICAL HERNIA REPAIR  03/20/2012   Procedure: HERNIA REPAIR UMBILICAL ADULT;  Surgeon: [name], MD;  Location: [place];  Service: General;  Laterality: N/A;  . UMBILICAL HERNIA REPAIR  01/22/2013   preperitoneal open procedure due to significant adhesions/notes 01/22/2013  . VENTRAL HERNIA REPAIR N/A 01/22/2013   Procedure: ATTEMPTED LAPAROSCOPIC VENTRAL HERNIA CONVERTED TO OPEN;  Surgeon: [name], MD;  [facility];  Service:  General;  Laterality: N/A;    FAMILY HISTORY Family History  Problem Relation Age of Onset  . Heart disease Mother   . Hyperlipidemia Mother   . Hypertension Mother   . Kidney disease Father   . Stroke Father   . Kidney disease Brother   . Amblyopia Neg Hx   . Blindness Neg Hx   . Cataracts Neg Hx   . Diabetes Neg Hx   . Glaucoma Neg Hx   . Macular degeneration Neg Hx   . Retinal detachment Neg Hx   . Strabismus Neg Hx   . Retinitis pigmentosa Neg Hx     SOCIAL HISTORY Social History   Tobacco Use  . Smoking status: Never Smoker  . Smokeless tobacco: Never Used  Substance Use Topics  . Alcohol use: No    Alcohol/week: 0.0 standard drinks  . Drug use: No         OPHTHALMIC EXAM:  Base Eye Exam    Visual Acuity (Snellen - Linear)      Right Left   Dist cc 20/25 -2 20/20 -2   Dist ph cc 20/20 -1    Correction: Glasses       Tonometry (Tonopen, 8:23 AM)      Right Left   Pressure 23 21  I got 23 twice and tried a different tip cover. It was 24 twice.       Pupils      Dark Light Shape React APD   Right 4 3 Round Brisk None   Left 4 3 Round Brisk None       Visual Fields (Counting fingers)      Left Right    Full Full       Extraocular Movement      Right Left    Full, Ortho Full, Ortho       Neuro/Psych    Oriented x3: Yes   Mood/Affect: Normal       Dilation    Both eyes: 1.0% Mydriacyl, 2.5% Phenylephrine @ 8:23 AM        Slit Lamp and Fundus Exam    Slit Lamp Exam      Right Left   Lids/Lashes Dermatochalasis - upper lid Dermatochalasis - upper lid   Conjunctiva/Sclera Mild Melanosis, Nasal Pinguecula, trace Injection Mild Melanosis, nasal and temporal Pinguecula   Cornea Arcus, 1+ Punctate epithelial erosions Arcus, 1+ Punctate epithelial erosions   Anterior  Chamber Deep and quiet Deep and quiet   Iris Round and dilated Round and dilated   Lens 2+ Nuclear sclerosis, 2+ Cortical cataract 2+ Nuclear sclerosis, 2+ Cortical cataract    Vitreous Vitreous syneresis, +Posterior vitreous detachment, trace cell Vitreous syneresis, Posterior vitreous detachment, +cell       Fundus Exam      Right Left   Disc Pink and Sharp, fine vascular loops superiorly trace hyperemia, Pink and Sharp   C/D Ratio 0.5 0.6   Macula Flat, good foveal reflex, mild RPE mottling and clumping, No heme or edema Blunted foveal reflex, focal blot hemes nasal macula extending from disc to fovea - improved, central RPE clumping, mild interval decrease in cystic changes, telengectetic vessels, linear pigment clumping nasal fovea   Vessels Vascular attenuation, Tortuous Vascular attenuation, Tortuous, Copper wiring, severe AV crossing changes, +inferior HRVO   Periphery Attached, pigmented Chorioretinal scar at 1030, No heme  Attached, pigmented lattice with 3 atrophic holes at 1030 ora - good laser surrounding, cluster of blot heme inferiorly -- persistent, pigmented macroaneurysm at 0300 - improving        Refraction    Wearing Rx      Sphere Cylinder Axis Add   Right +0.50 +0.50 168 +2.75   Left +0.50 +0.50 102 +2.75   Type: Bifocal       Wearing Rx #2      Sphere Cylinder Axis Add   Right +0.50   +2.25   Left +0.25   +2.25   Type: Bifocal          IMAGING AND PROCEDURES  Imaging and Procedures for 08/31/17  OCT, Retina - OU - Both Eyes       Right Eye Quality was good. Central Foveal Thickness: 241. Progression has been stable. Findings include normal foveal contour, no SRF, no IRF, vitreomacular adhesion , inner retinal atrophy (Inner retinal atrophy IT quad caught on widefield).   Left Eye Quality was good. Central Foveal Thickness: 259. Progression has improved. Findings include normal foveal contour, no SRF, epiretinal membrane, vitreomacular adhesion , outer retinal atrophy, intraretinal hyper-reflective material, intraretinal fluid (Mild persistent cystic changes IN macula, interval improvement in ellipsoid signal and IRHM).    Notes *Images captured and stored on drive  Diagnosis / Impression:  OD: NFP, No IRF/SRF; Inner retinal atrophy IT quad caught on widefield - stable OS: trace ERM; Mild persistent cystic changes IN macula, interval improvement in ellipsoid signal and IRHM  Clinical management:  See below  Abbreviations: NFP - Normal foveal profile. CME - cystoid macular edema. PED - pigment epithelial detachment. IRF - intraretinal fluid. SRF - subretinal fluid. EZ - ellipsoid zone. ERM - epiretinal membrane. ORA - outer retinal atrophy. ORT - outer retinal tubulation. SRHM - subretinal hyper-reflective material         Intravitreal Injection, Pharmacologic Agent - OS - Left Eye       Time Out 05/16/2019. 9:36 AM. Confirmed correct patient, procedure, site, and patient consented.   Anesthesia Topical anesthesia was used. Anesthetic medications included Lidocaine 2%, Proparacaine 0.5%.   Procedure Preparation included 5% betadine to ocular surface, eyelid speculum. A 30 gauge needle was used.   Injection:  1.25 mg Bevacizumab (AVASTIN) SOLN   NDC: 32440-102-72, Lot: [id number] , Expiration date: 07/31/2019   Route: Intravitreal, Site: Left Eye, Waste: 0 mL  Post-op Post injection exam found visual acuity of at least counting fingers. The patient tolerated the procedure well. There were no complications. The patient received written  and verbal post procedure care education.                 ASSESSMENT/PLAN:    ICD-10-CM   1. Branch retinal vein occlusion of left eye with macular edema  H34.8320 Intravitreal Injection, Pharmacologic Agent - OS - Left Eye    Bevacizumab (AVASTIN) SOLN 1.25 mg  2. Retinal edema  H35.81 OCT, Retina - OU - Both Eyes  3. Herpes keratitis  B00.52   4. Anterior uveitis  H20.9   5. Branch retinal vein occlusion of right eye with macular edema  H34.8310   6. Lattice degeneration of left retina  H35.412   7. Atrophic retinal break, multiple, left eye   H33.332   8. Posterior vitreous detachment of both eyes  H43.813   9. Combined forms of age-related cataract of both eyes  H25.813    1,2. Inferior HRVO OS  - just prior to 9.24.20 visit, found to have a clot in fistula, left arm  - exam shows clusters of DBH inferior hemispheric distribution and telangectatic vessels  - s/p IVA OS #1 (09.24.20), #2 (10.22.20), #3 (11.23.20)   **of note on 12.21.20 -- pt had to leave visit prior to being seen by MD due to weakness / illness following dialysis**  - BCVA improved to 20/20 from 20/30  - OCT shows mild persistent cystic changes IN macula, interval improvement in ellipsoid signal and IRHM  - exam shows improving DBH  - recommend IVA OS #4 today, 01.14.21  - pt wishes to proceed  - RBA of procedure discussed, questions answered  - informed consent obtained and signed  - see procedure note  - F/U 6-7 week -- DFE/OCT/possible injection  3,4. 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  - likely etiology of prior episodes of Anterior Uveitis OD  - FA on 3.18.19 without posterior involvement, vasculitis or leakage OD  - had recurrent AC cell and stellate KP on 3.11.2020 - restarted PF QID OD  - today KP and injection stably resolved, only rare pigment remains  - IOP 23 OD, 21 OS today   - cont Pred Forte to QD OD  - given history of repeat recurrences of anterior uveitis despite maintenance acyclovir, may need to be on a maintenance dose of topical steroid  - cont Cosopt BID OU  - epithelial dendrite remains resolved today (on po acyclovir; off topical trifluridine)  - under the expert management of Dr. Kathlen Mody  - continue po acyclovir, 400 mg QD per Dr. Kathlen Mody  5. History of BRVO w/ CME OD  - s/p IVA OD #1 (12.21.18), #2 (01.18.19), #3 (02.14.19), #4 (04.02.19), #5 (05.02.19)  - OCT today with stable improvement in IRF despite no antiVEGF therapy since May 2019 -- resolved  - will continue to hold on injection OD  6,7. Lattice  degeneration with atrophic holes OS-   - peripheral holes superiorly at 1030 without SRF or RD  - S/P laser retinopexy OS (01.04.19)  - good laser in place--stable  8. PVD / vitreous syneresis OU  - Discussed findings and prognosis  - No RT or RD on 360 scleral depressed exam  - Reviewed s/s of RT/RD  - Strict return precautions for any such RT/RD signs/symptoms  9. Combined forms age-related cataract OU-   - The symptoms of cataract, surgical options, and treatments and risks were discussed with patient.  - discussed diagnosis and progression  - not yet visually significant  - monitor for now   Ophthalmic  Meds Ordered this visit:  Meds ordered this encounter  Medications  . Bevacizumab (AVASTIN) SOLN 1.25 mg       Return for f/u 6-7 weeks, HRVO OS, DFE, OCT.  There are no Patient Instructions on file for this visit.   This document serves as a record of services personally performed by Gardiner Sleeper, MD, PhD. It was created on their behalf by Leeann Must, [place], a certified ophthalmic assistant. The creation of this record is the provider's dictation and/or activities during the visit.    Electronically signed by: Leeann Must, COA @TODAY @ 9:48 AM   This document serves as a record of services personally performed by Gardiner Sleeper, MD, PhD. It was created on their behalf by Ernest Mallick, OA, an ophthalmic assistant. The creation of this record is the provider's dictation and/or activities during the visit.    Electronically signed by: Ernest Mallick, OA 01.14.2021 9:48 AM    Gardiner Sleeper, M.D., Ph.D. Diseases & Surgery of the Retina and Vitreous [facility]  I have reviewed the above documentation for accuracy and completeness, and I agree with the above. Gardiner Sleeper, M.D., Ph.D. 05/16/19 9:48 AM    Abbreviations: M myopia (nearsighted); A astigmatism; H hyperopia (farsighted); P presbyopia; Mrx spectacle prescription;  CTL contact  lenses; OD right eye; OS left eye; OU both eyes  XT exotropia; ET esotropia; PEK punctate epithelial keratitis; PEE punctate epithelial erosions; DES dry eye syndrome; MGD meibomian gland dysfunction; ATs artificial tears; PFAT's preservative free artificial tears; [place] nuclear sclerotic cataract; PSC posterior subcapsular cataract; ERM epi-retinal membrane; PVD posterior vitreous detachment; RD retinal detachment; DM diabetes mellitus; DR diabetic retinopathy; NPDR non-proliferative diabetic retinopathy; PDR proliferative diabetic retinopathy; CSME clinically significant macular edema; DME diabetic macular edema; dbh dot blot hemorrhages; CWS cotton wool spot; POAG primary open angle glaucoma; C/D cup-to-disc ratio; HVF humphrey visual field; GVF goldmann visual field; OCT optical coherence tomography; IOP intraocular pressure; BRVO Branch retinal vein occlusion; CRVO central retinal vein occlusion; CRAO central retinal artery occlusion; BRAO branch retinal artery occlusion; RT retinal tear; SB scleral buckle; PPV pars plana vitrectomy; VH Vitreous hemorrhage; PRP panretinal laser photocoagulation; IVK intravitreal kenalog; VMT vitreomacular traction; MH Macular hole;  NVD neovascularization of the disc; NVE neovascularization elsewhere; AREDS age related eye disease study; ARMD age related macular degeneration; POAG primary open angle glaucoma; EBMD epithelial/anterior basement membrane dystrophy; ACIOL anterior chamber intraocular lens; IOL intraocular lens; PCIOL posterior chamber intraocular lens; Phaco/IOL phacoemulsification with intraocular lens placement; [place] photorefractive keratectomy; LASIK laser assisted in situ keratomileusis; HTN hypertension; DM diabetes mellitus; COPD chronic obstructive pulmonary disease

## 2019-05-10 DIAGNOSIS — D509 Iron deficiency anemia, unspecified: Secondary | ICD-10-CM | POA: Diagnosis not present

## 2019-05-10 DIAGNOSIS — N2581 Secondary hyperparathyroidism of renal origin: Secondary | ICD-10-CM | POA: Diagnosis not present

## 2019-05-10 DIAGNOSIS — E785 Hyperlipidemia, unspecified: Secondary | ICD-10-CM | POA: Diagnosis not present

## 2019-05-10 DIAGNOSIS — N186 End stage renal disease: Secondary | ICD-10-CM | POA: Diagnosis not present

## 2019-05-10 DIAGNOSIS — Z992 Dependence on renal dialysis: Secondary | ICD-10-CM | POA: Diagnosis not present

## 2019-05-13 DIAGNOSIS — N2581 Secondary hyperparathyroidism of renal origin: Secondary | ICD-10-CM | POA: Diagnosis not present

## 2019-05-13 DIAGNOSIS — N186 End stage renal disease: Secondary | ICD-10-CM | POA: Diagnosis not present

## 2019-05-13 DIAGNOSIS — Z992 Dependence on renal dialysis: Secondary | ICD-10-CM | POA: Diagnosis not present

## 2019-05-13 DIAGNOSIS — D509 Iron deficiency anemia, unspecified: Secondary | ICD-10-CM | POA: Diagnosis not present

## 2019-05-13 DIAGNOSIS — E785 Hyperlipidemia, unspecified: Secondary | ICD-10-CM | POA: Diagnosis not present

## 2019-05-15 DIAGNOSIS — N2581 Secondary hyperparathyroidism of renal origin: Secondary | ICD-10-CM | POA: Diagnosis not present

## 2019-05-15 DIAGNOSIS — Z992 Dependence on renal dialysis: Secondary | ICD-10-CM | POA: Diagnosis not present

## 2019-05-15 DIAGNOSIS — D509 Iron deficiency anemia, unspecified: Secondary | ICD-10-CM | POA: Diagnosis not present

## 2019-05-15 DIAGNOSIS — N186 End stage renal disease: Secondary | ICD-10-CM | POA: Diagnosis not present

## 2019-05-15 DIAGNOSIS — E785 Hyperlipidemia, unspecified: Secondary | ICD-10-CM | POA: Diagnosis not present

## 2019-05-16 ENCOUNTER — Ambulatory Visit (INDEPENDENT_AMBULATORY_CARE_PROVIDER_SITE_OTHER): Payer: Medicare Other | Admitting: Ophthalmology

## 2019-05-16 ENCOUNTER — Encounter (INDEPENDENT_AMBULATORY_CARE_PROVIDER_SITE_OTHER): Payer: Self-pay | Admitting: Ophthalmology

## 2019-05-16 DIAGNOSIS — H34832 Tributary (branch) retinal vein occlusion, left eye, with macular edema: Secondary | ICD-10-CM

## 2019-05-16 DIAGNOSIS — H43813 Vitreous degeneration, bilateral: Secondary | ICD-10-CM | POA: Diagnosis not present

## 2019-05-16 DIAGNOSIS — H3581 Retinal edema: Secondary | ICD-10-CM | POA: Diagnosis not present

## 2019-05-16 DIAGNOSIS — B0052 Herpesviral keratitis: Secondary | ICD-10-CM

## 2019-05-16 DIAGNOSIS — H35412 Lattice degeneration of retina, left eye: Secondary | ICD-10-CM

## 2019-05-16 DIAGNOSIS — H34831 Tributary (branch) retinal vein occlusion, right eye, with macular edema: Secondary | ICD-10-CM | POA: Diagnosis not present

## 2019-05-16 DIAGNOSIS — H25813 Combined forms of age-related cataract, bilateral: Secondary | ICD-10-CM

## 2019-05-16 DIAGNOSIS — H33332 Multiple defects of retina without detachment, left eye: Secondary | ICD-10-CM

## 2019-05-16 DIAGNOSIS — H209 Unspecified iridocyclitis: Secondary | ICD-10-CM | POA: Diagnosis not present

## 2019-05-16 MED ORDER — BEVACIZUMAB CHEMO INJECTION 1.25MG/0.05ML SYRINGE FOR KALEIDOSCOPE
1.2500 mg | INTRAVITREAL | Status: AC | PRN
  Administered 2019-05-16: 1.25 mg via INTRAVITREAL

## 2019-05-17 DIAGNOSIS — N2581 Secondary hyperparathyroidism of renal origin: Secondary | ICD-10-CM | POA: Diagnosis not present

## 2019-05-17 DIAGNOSIS — N186 End stage renal disease: Secondary | ICD-10-CM | POA: Diagnosis not present

## 2019-05-17 DIAGNOSIS — Z992 Dependence on renal dialysis: Secondary | ICD-10-CM | POA: Diagnosis not present

## 2019-05-17 DIAGNOSIS — E785 Hyperlipidemia, unspecified: Secondary | ICD-10-CM | POA: Diagnosis not present

## 2019-05-17 DIAGNOSIS — D509 Iron deficiency anemia, unspecified: Secondary | ICD-10-CM | POA: Diagnosis not present

## 2019-05-20 DIAGNOSIS — D509 Iron deficiency anemia, unspecified: Secondary | ICD-10-CM | POA: Diagnosis not present

## 2019-05-20 DIAGNOSIS — N2581 Secondary hyperparathyroidism of renal origin: Secondary | ICD-10-CM | POA: Diagnosis not present

## 2019-05-20 DIAGNOSIS — E785 Hyperlipidemia, unspecified: Secondary | ICD-10-CM | POA: Diagnosis not present

## 2019-05-20 DIAGNOSIS — Z992 Dependence on renal dialysis: Secondary | ICD-10-CM | POA: Diagnosis not present

## 2019-05-20 DIAGNOSIS — N186 End stage renal disease: Secondary | ICD-10-CM | POA: Diagnosis not present

## 2019-05-21 ENCOUNTER — Encounter: Payer: Self-pay | Admitting: Internal Medicine

## 2019-05-21 ENCOUNTER — Telehealth: Payer: Self-pay | Admitting: Radiology

## 2019-05-21 ENCOUNTER — Other Ambulatory Visit: Payer: Self-pay

## 2019-05-21 ENCOUNTER — Ambulatory Visit (INDEPENDENT_AMBULATORY_CARE_PROVIDER_SITE_OTHER): Payer: Medicare Other | Admitting: Internal Medicine

## 2019-05-21 VITALS — BP 112/84 | HR 96 | Ht 66.0 in | Wt 182.4 lb

## 2019-05-21 DIAGNOSIS — R002 Palpitations: Secondary | ICD-10-CM

## 2019-05-21 DIAGNOSIS — I4892 Unspecified atrial flutter: Secondary | ICD-10-CM

## 2019-05-21 DIAGNOSIS — I471 Supraventricular tachycardia: Secondary | ICD-10-CM | POA: Diagnosis not present

## 2019-05-21 NOTE — Progress Notes (Signed)
HPI [name] returns today for followup. He is a pleasant 64 man with a h/o atrial flutter who underwent EP study and catheter ablation approximately 4 weeks ago. He feels better. His HR was slow but has improved. He has not had trouble with HD which is 3 days a week. He denies chest pain or sob. No syncope. No edema. No Known Allergies   Current Outpatient Medications  Medication Sig Dispense Refill  . acyclovir (ZOVIRAX) 400 MG tablet Take 400 mg by mouth every evening.     [name] aspirin EC 81 MG EC tablet Take 1 tablet (81 mg total) by mouth daily.    [name] atorvastatin (LIPITOR) 40 MG tablet Take 40 mg by mouth every evening.     . dorzolamide-timolol (COSOPT) 22.3-6.8 MG/ML ophthalmic solution Place 1 drop into both eyes 2 (two) times daily. 10 mL 10  . HYDROcodone-acetaminophen (NORCO) 5-325 MG tablet Take 1 tablet by mouth every 6 (six) hours as needed for moderate pain. 20 tablet 0  . nitroGLYCERIN (NITROSTAT) 0.4 MG SL tablet Place 1 tablet (0.4 mg total) under the tongue every 5 (five) minutes x 3 doses as needed for chest pain. 30 tablet 12  . prednisoLONE acetate (PRED FORTE) 1 % ophthalmic suspension Place 1 drop into the right eye daily at 12 noon.     . sevelamer carbonate (RENVELA) 800 MG tablet Take 1,600 mg by mouth 3 (three) times daily with meals.     No current facility-administered medications for this visit.     Past Medical History:  Diagnosis Date  . Atrial arrhythmia    atrial tachycardia with variable AV conduction versus atypical aflutter 01/10/19, rate control (02/06/19)  . Cataract   . Dyspnea    on exertion  . ESRD (end stage renal disease) ([place])    Hemo- MWF, Polycystic kidney disease  . Fatigue   . History of kidney stones    removal of stone- cysto  . Hyperlipidemia   . Hyperparathyroidism, secondary renal ([place])   . Hypertension   . Hypoxemia 12/12/2013  . Nonischemic cardiomyopathy ([place])    Er 25% 2015, 55 % 2013  . OSA on CPAP    no longer  using cpap  . OSA on CPAP 03/24/2014  . Pneumonia    2015ish  . Ventricular tachycardia//Freq PVCs   . Wears glasses     ROS:   All systems reviewed and negative except as noted in the HPI.   Past Surgical History:  Procedure Laterality Date  . A-FLUTTER ABLATION N/A 04/11/2019   Procedure: A-FLUTTER ABLATION;  Surgeon: Evans Lance, MD;  Location: [facility];  Service: Cardiovascular;  Laterality: N/A;  . A/V FISTULAGRAM Left 04/27/2017   Procedure: A/V FISTULAGRAM;  Surgeon: Conrad Weinert, MD;  Location: [facility];  Service: Cardiovascular;  Laterality: Left;  lt arm  . A/V FISTULAGRAM Left 01/10/2019   Procedure: A/V FISTULAGRAM;  Surgeon: Marty Heck, MD;  Location: [facility];  Service: Cardiovascular;  Laterality: Left;  . APPENDECTOMY    . AV FISTULA PLACEMENT  12/05/2011   Procedure: ARTERIOVENOUS (AV) FISTULA CREATION;LLEFT ARM  Surgeon: Conrad Clearbrook, MD;  Location: [facility];  Service: Vascular;  Laterality: Left;  RADIO-CEPHALIC  fistula left arm  . AV FISTULA PLACEMENT  01/11/2012   Procedure: ARTERIOVENOUS (AV) FISTULA CREATION;  Surgeon: Conrad Grayson, MD;  Location: [place];  Service: Vascular;  Laterality: Left;  Creation of left  brachial cephalic arteriovenous fistula  . AV FISTULA PLACEMENT Right 03/07/2019   Procedure: ARTERIOVENOUS (AV) FISTULA CREATION  RIGHT ARM;  Surgeon: Marty Heck, MD;  Location: [facility];  Service: Vascular;  Laterality: Right;  . BASCILIC VEIN TRANSPOSITION Left 12/27/2016   Procedure: BASILIC VEIN TRANSPOSITION LEFT UPPER ARM FIRST STAGE;  Surgeon: Conrad Severy, MD;  Location: [place];  Service: Vascular;  Laterality: Left;  . BASCILIC VEIN TRANSPOSITION Left 01/31/2017   Procedure: LEFT ARM BASILIC VEIN TRANSPOSITION, SECOND STAGE;  Surgeon: Conrad Blackgum, MD;  Location: [facility];  Service: Vascular;  Laterality: Left;  . CARDIAC CATHETERIZATION  04-05-2010   checking for blockage but none-[facility]  . COLONOSCOPY      . CYSTOSCOPY W/ STONE MANIPULATION     "laser once" (01/22/2013)  . HEMATOMA EVACUATION Left 05/09/2017   Procedure: EVACUATION HEMATOMA LEFT ARM;  Surgeon: Conrad Greenfield, MD;  Location: [facility];  Service: Vascular;  Laterality: Left;  . HERNIA REPAIR    . INGUINAL HERNIA REPAIR Right 11/06/2015   Procedure: OPEN HERNIA REPAIR  RIGHT INGUINAL ADULT;  Surgeon: [name], MD;  Location: [facility];  Service: General;  Laterality: Right;  with MESH  . INSERTION OF DIALYSIS CATHETER Right 10/05/2016   Procedure: INSERTION OF right internal jugular DIALYSIS CATHETER;  Surgeon: [name], MD;  Location: [place];  Service: Vascular;  Laterality: Right;  . INSERTION OF MESH  03/20/2012   Procedure: INSERTION OF MESH;  UMB Surgeon: [name], MD;  Location: [facility];  Service: General;  Laterality: N/A;  . INSERTION OF MESH N/A 01/22/2013   Procedure: INSERTION OF MESH;  Surgeon: [name], MD;  Location: [place];  Service: General;  Laterality: N/A;  . LAPAROTOMY  04/02/2012   Procedure: EXPLORATORY LAPAROTOMY;  Surgeon: [name], MD;  Location: [place];  Service: General;  Laterality: N/A;  Exploratory Laparotomy with resection of small intestine  . LEFT HEART CATHETERIZATION WITH CORONARY ANGIOGRAM N/A 05/14/2013   Procedure: LEFT HEART CATHETERIZATION WITH CORONARY ANGIOGRAM;  Surgeon: Sinclair Grooms, MD;  Location: [facility];  Service: Cardiovascular;  Laterality: N/A;  . LIGATION OF ARTERIOVENOUS  FISTULA Left 12/27/2016   Procedure: LIGATION/EXCISION OF LEFT UPPER ARM ARTERIOVENOUS  FISTULA;  EVACUATION OF HEMATOMA;  Surgeon: Conrad , MD;  Location: [place];  Service: Vascular;  Laterality: Left;  . LIGATION OF ARTERIOVENOUS  FISTULA Left 03/07/2019   Procedure: LIGATION OF ARTERIOVENOUS FISTULA  LEFT UPPER ARM;  Surgeon: Marty Heck, MD;  Location: [place];  Service: Vascular;  Laterality: Left;  . REVISON OF ARTERIOVENOUS FISTULA Left 10/05/2016   Procedure: REVISON OF  left arm ARTERIOVENOUS FISTULA;  Surgeon: [name], MD;  Location: [place];  Service: Vascular;  Laterality: Left;  . TONSILLECTOMY    . UMBILICAL HERNIA REPAIR  03/20/2012   Procedure: HERNIA REPAIR UMBILICAL ADULT;  Surgeon: [name], MD;  Location: [place];  Service: General;  Laterality: N/A;  . UMBILICAL HERNIA REPAIR  01/22/2013   preperitoneal open procedure due to significant adhesions/notes 01/22/2013  . VENTRAL HERNIA REPAIR N/A 01/22/2013   Procedure: ATTEMPTED LAPAROSCOPIC VENTRAL HERNIA CONVERTED TO OPEN;  Surgeon: [name], MD;  Location: [facility];  Service: General;  Laterality: N/A;     Family History  Problem Relation Age of Onset  . Heart disease Mother   . Hyperlipidemia Mother   . Hypertension Mother   . Kidney disease Father   . Stroke Father   .  Kidney disease Brother   . Amblyopia Neg Hx   . Blindness Neg Hx   . Cataracts Neg Hx   . Diabetes Neg Hx   . Glaucoma Neg Hx   . Macular degeneration Neg Hx   . Retinal detachment Neg Hx   . Strabismus Neg Hx   . Retinitis pigmentosa Neg Hx      Social History   Socioeconomic History  . Marital status: Married    Spouse name: [name]  . Number of children: 3  . Years of education: 62  . Highest education level: Not on file  Occupational History    Comment: disabled  Tobacco Use  . Smoking status: Never Smoker  . Smokeless tobacco: Never Used  Substance and Sexual Activity  . Alcohol use: No    Alcohol/week: 0.0 standard drinks  . Drug use: No  . Sexual activity: Yes  Other Topics Concern  . Not on file  Social History Narrative   Patient is married [place]) and lives at home with his wife and children.   Patient has three children.   Patient is in disability.   Patient has a high school education.   Patient is right-handed   Patient drinks very little soda.            Social Determinants of Health   Financial Resource Strain:   . Difficulty of Paying Living Expenses: Not on file    Food Insecurity:   . Worried About Charity fundraiser in the Last Year: Not on file  . Ran Out of Food in the Last Year: Not on file  Transportation Needs:   . Lack of Transportation (Medical): Not on file  . Lack of Transportation (Non-Medical): Not on file  Physical Activity:   . Days of Exercise per Week: Not on file  . Minutes of Exercise per Session: Not on file  Stress:   . Feeling of Stress : Not on file  Social Connections:   . Frequency of Communication with Friends and Family: Not on file  . Frequency of Social Gatherings with Friends and Family: Not on file  . Attends Religious Services: Not on file  . Active Member of Clubs or Organizations: Not on file  . Attends Archivist Meetings: Not on file  . Marital Status: Not on file  Intimate Partner Violence:   . Fear of Current or Ex-Partner: Not on file  . Emotionally Abused: Not on file  . Physically Abused: Not on file  . Sexually Abused: Not on file     BP 112/84   Pulse 96   Ht 5\' 6"  (1.676 m)   Wt 182 lb 6.4 oz (82.7 kg)   SpO2 99%   BMI 29.44 kg/m   Physical Exam:  Well appearing 64 yo man, NAD HEENT: Unremarkable Neck:  6 cm JVD, no thyromegally Lymphatics:  No adenopathy Back:  No CVA tenderness Lungs:  Clear with no wheezes HEART:  Regular rate rhythm, no murmurs, no rubs, no clicks Abd:  soft, positive bowel sounds, no organomegally, no rebound, no guarding Ext:  2 plus pulses, no edema, no cyanosis, no clubbing Skin:  No rashes no nodules Neuro:  CN II through XII intact, motor grossly intact  EKG - atrial tachycardia at 140/min with variable AV conduction   Assess/Plan: 1. Atrial flutter - he is s/p ablation of typical atrial flutter.  2. Sinus node dysfunction - he was bradycardic after his ablation in [place] and we held his  beta blocker.  3. Atrial tachycardia - options for AA drug therapy are limited with his renal failure 4. HTN - his blood pressure is well controlled.    Mikle Bosworth.D.

## 2019-05-21 NOTE — Patient Instructions (Addendum)
Medication Instructions:  Your physician recommends that you continue on your current medications as directed. Please refer to the Current Medication list given to you today.  Labwork: None ordered.  Testing/Procedures: Your physician has recommended that you wear a holter monitor. Holter monitors are medical devices that record the heart's electrical activity. Doctors most often use these monitors to diagnose arrhythmias. Arrhythmias are problems with the speed or rhythm of the heartbeat. The monitor is a small, portable device. You can wear one while you do your normal daily activities. This is usually used to diagnose what is causing palpitations/syncope (passing out).  Follow-Up: Your physician wants you to follow-up in: based on results of your heart monitor.  Any Other Special Instructions Will Be Listed Below (If Applicable).  If you need a refill on your cardiac medications before your next appointment, please call your pharmacy.   ZIO XT- Long Term Monitor Instructions   Your physician has requested you wear your ZIO patch monitor__3___days.   This is a single patch monitor.  [employer] supplies one patch monitor per enrollment.  Additional stickers are not available.   Please do not apply patch if you will be having a Nuclear Stress Test, Echocardiogram, Cardiac CT, MRI, or Chest Xray during the time frame you would be wearing the monitor. The patch cannot be worn during these tests.  You cannot remove and re-apply the ZIO XT patch monitor.   Your ZIO patch monitor will be sent USPS Priority mail from [facility] directly to your home address. The monitor may also be mailed to a PO BOX if home delivery is not available.   It may take 3-5 days to receive your monitor after you have been enrolled.   Once you have received you monitor, please review enclosed instructions.  Your monitor has already been registered assigning a specific monitor serial # to you.   Applying the  monitor   Shave hair from upper left chest.   Hold abrader disc by orange tab.  Rub abrader in 40 strokes over left upper chest as indicated in your monitor instructions.   Clean area with 4 enclosed alcohol pads .  Use all pads to assure are is cleaned thoroughly.  Let dry.   Apply patch as indicated in monitor instructions.  Patch will be place under collarbone on left side of chest with arrow pointing upward.   Rub patch adhesive wings for 2 minutes.Remove white label marked "1".  Remove white label marked "2".  Rub patch adhesive wings for 2 additional minutes.   While looking in a mirror, press and release button in center of patch.  A small green light will flash 3-4 times .  This will be your only indicator the monitor has been turned on.     Do not shower for the first 24 hours.  You may shower after the first 24 hours.   Press button if you feel a symptom. You will hear a small click.  Record Date, Time and Symptom in the Patient Log Book.   When you are ready to remove patch, follow instructions on last 2 pages of Patient Log Book.  Stick patch monitor onto last page of Patient Log Book.   Place Patient Log Book in [place] box.  Use locking tab on box and tape box closed securely.  The [employer] and [employer] has [employer] on it.  Please place in mailbox as soon as possible.  Your physician should have your test results approximately 7  days after the monitor has been mailed back to [place].   Call [facility] at [phone] if you have questions regarding your ZIO XT patch monitor.  Call them immediately if you see an orange light blinking on your monitor.   If your monitor falls off in less than 4 days contact our Monitor department at [phone].  If your monitor becomes loose or falls off after 4 days call [employer] at [phone] for suggestions on securing your monitor.

## 2019-05-21 NOTE — Telephone Encounter (Signed)
Enrolled patient for a 3 day Zio Monitor to be mailed to patients home.

## 2019-05-23 ENCOUNTER — Other Ambulatory Visit (INDEPENDENT_AMBULATORY_CARE_PROVIDER_SITE_OTHER): Payer: Medicare Other

## 2019-05-23 ENCOUNTER — Other Ambulatory Visit: Payer: Self-pay | Admitting: *Deleted

## 2019-05-23 DIAGNOSIS — I471 Supraventricular tachycardia: Secondary | ICD-10-CM

## 2019-05-23 DIAGNOSIS — R002 Palpitations: Secondary | ICD-10-CM

## 2019-05-23 NOTE — Progress Notes (Signed)
Notified of time and date change for nasal swab and surgery with Dr. Carlis Abbott. Instructed nasal swab at [facility] 10 am on 05/25/2019. To be at [facility] admitting at 7 am or as directed by the hospital on 05/29/2019 for surgery. Expect a call and follow the detailed surgery instructions received from the hospital pre-admission department. Verbalized understanding. To call this office if questions.

## 2019-05-24 DIAGNOSIS — D509 Iron deficiency anemia, unspecified: Secondary | ICD-10-CM | POA: Diagnosis not present

## 2019-05-24 DIAGNOSIS — Z992 Dependence on renal dialysis: Secondary | ICD-10-CM | POA: Diagnosis not present

## 2019-05-24 DIAGNOSIS — E785 Hyperlipidemia, unspecified: Secondary | ICD-10-CM | POA: Diagnosis not present

## 2019-05-24 DIAGNOSIS — N186 End stage renal disease: Secondary | ICD-10-CM | POA: Diagnosis not present

## 2019-05-24 DIAGNOSIS — N2581 Secondary hyperparathyroidism of renal origin: Secondary | ICD-10-CM | POA: Diagnosis not present

## 2019-05-25 ENCOUNTER — Other Ambulatory Visit (HOSPITAL_COMMUNITY)
Admission: RE | Admit: 2019-05-25 | Discharge: 2019-05-25 | Disposition: A | Discharge status: Home / Self Care | Payer: Medicare Other | Source: Ambulatory Visit | Attending: Vascular Surgery | Admitting: Vascular Surgery

## 2019-05-25 DIAGNOSIS — Z20822 Contact with and (suspected) exposure to covid-19: Secondary | ICD-10-CM | POA: Insufficient documentation

## 2019-05-25 DIAGNOSIS — Z01812 Encounter for preprocedural laboratory examination: Secondary | ICD-10-CM | POA: Insufficient documentation

## 2019-05-25 LAB — SARS CORONAVIRUS 2 (TAT 6-24 HRS): SARS Coronavirus 2: NEGATIVE

## 2019-05-27 ENCOUNTER — Other Ambulatory Visit: Payer: Self-pay

## 2019-05-27 ENCOUNTER — Encounter (HOSPITAL_COMMUNITY): Payer: Self-pay | Admitting: Vascular Surgery

## 2019-05-27 DIAGNOSIS — N2581 Secondary hyperparathyroidism of renal origin: Secondary | ICD-10-CM | POA: Diagnosis not present

## 2019-05-27 DIAGNOSIS — D509 Iron deficiency anemia, unspecified: Secondary | ICD-10-CM | POA: Diagnosis not present

## 2019-05-27 DIAGNOSIS — N186 End stage renal disease: Secondary | ICD-10-CM | POA: Diagnosis not present

## 2019-05-27 DIAGNOSIS — E785 Hyperlipidemia, unspecified: Secondary | ICD-10-CM | POA: Diagnosis not present

## 2019-05-27 DIAGNOSIS — Z992 Dependence on renal dialysis: Secondary | ICD-10-CM | POA: Diagnosis not present

## 2019-05-27 NOTE — Progress Notes (Signed)
Pt denies having SOB and chest pain. Pt stated that he is under the care of Dr. Cristopher Peru, Cardiology and Dr. [name], PCP. Pt made aware to stop taking vitamins, fish oil and herbal medications. Do not take any NSAIDs ie: Ibuprofen, Advil, Naproxen (Aleve), Motrin, BC and Goody Powder. Pt reminded to quarantine. Pt verbalized understanding of all pre-op instructions. PA, Anesthesiology, asked to review pt history.

## 2019-05-28 NOTE — Anesthesia Preprocedure Evaluation (Addendum)
Anesthesia Evaluation  Patient identified by [name], date of birth, ID band Patient awake    Reviewed: Allergy & Precautions, NPO status , Patient's Chart, lab work & pertinent test results  History of Anesthesia Complications Negative for: history of anesthetic complications  Airway Mallampati: III  TM Distance: >3 FB Neck ROM: Full    Dental  (+) Dental Advisory Given   Pulmonary shortness of breath, sleep apnea ,    breath sounds clear to auscultation       Cardiovascular hypertension,  Rhythm:Regular     Neuro/Psych negative neurological ROS     GI/Hepatic negative GI ROS, Neg liver ROS,   Endo/Other    Renal/GU ESRF and DialysisRenal diseaseHD Monday     Musculoskeletal   Abdominal   Peds  Hematology   Anesthesia Other Findings   Reproductive/Obstetrics                             Anesthesia Physical Anesthesia Plan  ASA: III  Anesthesia Plan: MAC   Post-op Pain Management:    Induction: Intravenous  PONV Risk Score and Plan: 1 and Treatment may vary due to age or medical condition and Propofol infusion  Airway Management Planned: Nasal Cannula  Additional Equipment: None  Intra-op Plan:   Post-operative Plan:   Informed Consent: I have reviewed the patients History and Physical, chart, labs and discussed the procedure including the risks, benefits and alternatives for the proposed anesthesia with the patient or authorized representative who has indicated his/her understanding and acceptance.     Dental advisory given  Plan Discussed with: CRNA and Surgeon  Anesthesia Plan Comments: (PAT note written 05/28/2019 by Myra Gianotti, PA-C. )       Anesthesia Quick Evaluation                                  Anesthesia Evaluation  Patient identified by [name], date of birth, ID band Patient awake    Reviewed: Allergy & Precautions, NPO status , Patient's Chart, lab  work & pertinent test results  History of Anesthesia Complications Negative for: history of anesthetic complications  Airway Mallampati: II  TM Distance: >3 FB Neck ROM: Full    Dental no notable dental hx. (+) Dental Advisory Given   Pulmonary sleep apnea ,    Pulmonary exam normal        Cardiovascular hypertension, + CAD  Normal cardiovascular exam+ dysrhythmias   Patient is a 64 year old male scheduled for the above procedure. Surgery was initially scheduled for 01/31/19, but postponed due to new aflutter on 01/10/19 EKG. Since then he saw EP cardiologist Dr. Lovena Le on 02/06/19. He reviewed EKG tracing and thought "probable atrial tachycardia with variable AV conduction". Patient asymptomatic, so rate control felt to be a reasonable option. Per Dr. Lovena Le, "he is low risk for cardiac complications from pending AV fistula placement."  In regards to his chronic systolic heart failure. He felt renal failure make patient a poor candidate for primary prevention ICD.  He was started on Toprol XL 25 mg 1/2 tablet daily.    Neuro/Psych negative neurological ROS  negative psych ROS   GI/Hepatic negative GI ROS,   Endo/Other  negative endocrine ROS  Renal/GU ESRF and DialysisRenal disease     Musculoskeletal   Abdominal   Peds  Hematology negative hematology ROS (+)   Anesthesia Other Findings  Reproductive/Obstetrics                            Anesthesia Physical Anesthesia Plan  ASA: III  Anesthesia Plan: MAC   Post-op Pain Management:    Induction:   PONV Risk Score and Plan: Ondansetron and Propofol infusion  Airway Management Planned: Natural Airway  Additional Equipment:   Intra-op Plan:   Post-operative Plan:   Informed Consent: I have reviewed the patients History and Physical, chart, labs and discussed the procedure including the risks, benefits and alternatives for the proposed anesthesia with the patient or  authorized representative who has indicated his/her understanding and acceptance.     Dental advisory given  Plan Discussed with: Anesthesiologist and CRNA  Anesthesia Plan Comments: (PAT note written 02/22/2019 by Myra Gianotti, PA-C. )       Anesthesia Quick Evaluation

## 2019-05-28 NOTE — Progress Notes (Signed)
Anesthesia Chart Review: Kathleene Hazel   Case: [id number] Date/Time: 05/29/19 0950   Procedure: FISTULA SUPERFICIALIZATION VERSES ARTERIOVENOUS GRAFT (Right )   Anesthesia type: Monitor Anesthesia Care   Pre-op diagnosis: END STAGE RENAL DISEASE   Location: [facility] OR ROOM 11 / [facility]   Surgeons: Marty Heck, MD      DISCUSSION: Patient is a 64 year old male scheduled for the above procedure.  History includes never smoker, non-ischemic cardiomyopathy (2015), VT/frequent PVCs (05/2013), aflutter (s/p ablation 04/11/19), atrial tachycardia, HTN, hyperlipidemia, ESRD(on hemodialysis), polycystic kidney disease, OSA, exertional dyspnea. He is s/p multiple hemodialysis access procedures, last few include 2nd stage left basilic vein transposition 01/31/17, evacuation of LUE hematoma 05/09/17, and ligation of left brachiobasilic AVF and right first stage basilic vein transposition 03/07/19.   Recently re-evaluated by EP cardiologist Dr. Lovena Le on 05/21/19 following a-flutter ablation 04/11/19. EKG read as atrial tachycardia (ventricular HR @ 96 bpm). 3 day Ziopatch report is still pending. He is on ASA but not on anticoagulation, and beta-blocker held due to bradycardia.   He denied SOB and chest pain at PAT RN phone interview. Pre-surgical COVID-19 test was negative on 05/25/19. He will need ISTAT labs on arrival. Anesthesia team to evaluate on the day of surgery.   VS:   BP Readings from Last 3 Encounters:  05/21/19 112/84  04/23/19 122/90  04/11/19 106/60   Pulse Readings from Last 3 Encounters:  05/21/19 96  04/23/19 80  04/11/19 71    PROVIDERS: [name], MD is PCP  - Cristopher Peru, MD is EP cardiologist. Last visit 05/21/19. By notes, patient doing well, without chest pain, SOB, or syncope. Was tolerating HD 3 days/week.[name] He was initially bradycardic following ablation, so beta blocker held. EKG showed atrial tachycardia. Options for atrial tachycardia limited due to his rental  failure. A 3-day Ziopatch was ordered. In regards to history of chronic systolic CHF, he previously wrote (02/06/19) that renal failure makes patient a "poor candidate for primary prevention ICD."    LABS: He is for ISTAT labs on arrival. As of 03/26/19, H/H 11.7/38.4, PLT 187, glucose 84, Cr 4.73, K 3.8, Na 147.    EKG:  - EKG 05/21/19 ([facility]): Per 05/21/19 note by Dr. Lovena Le, "EKG - atrial tachycardia at 140/min with variable AV conduction" Ventricular rate was 96 bpm.   - EKG 03/26/19 ([facility]): "EKG - atrial flutter/tachycardia with variable AV but mostly 2:1 conduction"  - EKG 01/10/19: Atypical atrial flutter with premature ventricular or aberrantly conducted complexes Left axis deviation Non-specific intra-ventricular conduction block Inferior infarct , age undetermined Abnormal ECG Since last tracing Atrial flutter NOW PRESENT Confirmed by Skeet Latch [phone]) on 01/11/2019 3:35:42 PM - Per Cristopher Peru, MD, "Probable atrial tachycardia with variable AV conduction."   CV: Aflutter ablation 04/11/19: CONCLUSIONS:  1. Isthmus-dependent right atrial flutter upon presentation.  2. Successful radiofrequency ablation of atrial flutter along the cavotricuspid isthmus with complete bidirectional isthmus block achieved.  3. No inducible arrhythmias following ablation.  4. No early apparent complications.   Echo 07/23/13: - Left ventricle: The cavity size was mildly dilated. Wall thickness was normal. Systolic function was severely reduced. The estimated ejection fraction was in the range of 25% to 30%. There is akinesis of the posterior myocardium. There is severe hypokinesis of the lateral myocardium. There is hypokinesis of the anterior myocardium. Doppler parameters are consistent with abnormal left ventricular relaxation (grade 1 diastolic dysfunction). - Aortic valve: Mild regurgitation. - Aortic root: The aortic root was mildly  dilated. - Left atrium: The  atrium was mildly dilated.  Cardiac cath 05/14/13: 1. Nonischemic cardiomyopathywith reduced EF less than 25% and severely elevated left ventricular end-diastolic pressures 2. Widely patent coronary arteries   Past Medical History:  Diagnosis Date  . Atrial arrhythmia    atrial tachycardia with variable AV conduction versus atypical aflutter 01/10/19, rate control (02/06/19)  . Cataract   . Dyspnea    on exertion  . ESRD (end stage renal disease) ([place])    Hemo- MWF, Polycystic kidney disease  . Fatigue   . History of kidney stones    removal of stone- cysto  . Hyperlipidemia   . Hyperparathyroidism, secondary renal ([place])   . Hypertension   . Hypoxemia 12/12/2013  . Nonischemic cardiomyopathy ([place])    Er 25% 2015, 55 % 2013  . OSA on CPAP    no longer using cpap  . OSA on CPAP 03/24/2014  . Pneumonia    2015ish  . Ventricular tachycardia//Freq PVCs   . Wears glasses     Past Surgical History:  Procedure Laterality Date  . A-FLUTTER ABLATION N/A 04/11/2019   Procedure: A-FLUTTER ABLATION;  Surgeon: Evans Lance, MD;  Location: [place] [facility];  Service: Cardiovascular;  Laterality: N/A;  . A/V FISTULAGRAM Left 04/27/2017   Procedure: A/V FISTULAGRAM;  Surgeon: Conrad [place], MD;  Location: [place] [facility];  Service: Cardiovascular;  Laterality: Left;  lt arm  . A/V FISTULAGRAM Left 01/10/2019   Procedure: A/V FISTULAGRAM;  Surgeon: Marty Heck, MD;  Location: [place] [facility];  Service: Cardiovascular;  Laterality: Left;  . APPENDECTOMY    . AV FISTULA PLACEMENT  12/05/2011   Procedure: ARTERIOVENOUS (AV) FISTULA CREATION;LLEFT ARM  Surgeon: Conrad [place], MD;  Location: [place];  Service: Vascular;  Laterality: Left;  RADIO-CEPHALIC  fistula left arm  . AV FISTULA PLACEMENT  01/11/2012   Procedure: ARTERIOVENOUS (AV) FISTULA CREATION;  Surgeon: Conrad [place], MD;  Location: [place];  Service: Vascular;  Laterality: Left;  Creation of left brachial cephalic  arteriovenous fistula  . AV FISTULA PLACEMENT Right 03/07/2019   Procedure: ARTERIOVENOUS (AV) FISTULA CREATION  RIGHT ARM;  Surgeon: Marty Heck, MD;  Location: [place];  Service: Vascular;  Laterality: Right;  . BASCILIC VEIN TRANSPOSITION Left 12/27/2016   Procedure: BASILIC VEIN TRANSPOSITION LEFT UPPER ARM FIRST STAGE;  Surgeon: Conrad Pound, MD;  Location: [place];  Service: Vascular;  Laterality: Left;  . BASCILIC VEIN TRANSPOSITION Left 01/31/2017   Procedure: LEFT ARM BASILIC VEIN TRANSPOSITION, SECOND STAGE;  Surgeon: Conrad [place], MD;  Location: [place];  Service: Vascular;  Laterality: Left;  . CARDIAC CATHETERIZATION  04-05-2010   checking for blockage but none-[facility]  . COLONOSCOPY    . CYSTOSCOPY W/ STONE MANIPULATION     "laser once" (01/22/2013)  . HEMATOMA EVACUATION Left 05/09/2017   Procedure: EVACUATION HEMATOMA LEFT ARM;  Surgeon: Conrad [place], MD;  Location: [place];  Service: Vascular;  Laterality: Left;  . HERNIA REPAIR    . INGUINAL HERNIA REPAIR Right 11/06/2015   Procedure: OPEN HERNIA REPAIR  RIGHT INGUINAL ADULT;  Surgeon: [name], MD;  [facility];  Service: General;  Laterality: Right;  with MESH  . INSERTION OF DIALYSIS CATHETER Right 10/05/2016   Procedure: INSERTION OF right internal jugular DIALYSIS CATHETER;  Surgeon: [name], MD;  Location: [place];  Service: Vascular;  Laterality: Right;  . INSERTION OF MESH  03/20/2012  Procedure: INSERTION OF MESH;  UMB Surgeon: [name], MD;  Location: [place];  Service: General;  Laterality: N/A;  . INSERTION OF MESH N/A 01/22/2013   Procedure: INSERTION OF MESH;  Surgeon: [name], MD;  Location: [place];  Service: General;  Laterality: N/A;  . LAPAROTOMY  04/02/2012   Procedure: EXPLORATORY LAPAROTOMY;  Surgeon: [name], MD;  Location: [place];  Service: General;  Laterality: N/A;  Exploratory Laparotomy with resection of small intestine  . LEFT HEART CATHETERIZATION WITH CORONARY  ANGIOGRAM N/A 05/14/2013   Procedure: LEFT HEART CATHETERIZATION WITH CORONARY ANGIOGRAM;  Surgeon: Sinclair Grooms, MD;  Location: [facility] C[facility] LAB;  Service: Cardiovascular;  Laterality: N/A;  . LIGATION OF ARTERIOVENOUS  FISTULA Left 12/27/2016   Procedure: LIGATION/EXCISION OF LEFT UPPER ARM ARTERIOVENOUS  FISTULA;  EVACUATION OF HEMATOMA;  Surgeon: Conrad [place], MD;  Location: [place];  Service: Vascular;  Laterality: Left;  . LIGATION OF ARTERIOVENOUS  FISTULA Left 03/07/2019   Procedure: LIGATION OF ARTERIOVENOUS FISTULA  LEFT UPPER ARM;  Surgeon: Marty Heck, MD;  Location: [place];  Service: Vascular;  Laterality: Left;  . REVISON OF ARTERIOVENOUS FISTULA Left 10/05/2016   Procedure: REVISON OF left arm ARTERIOVENOUS FISTULA;  Surgeon: [name], MD;  Location: [place];  Service: Vascular;  Laterality: Left;  . TONSILLECTOMY    . UMBILICAL HERNIA REPAIR  03/20/2012   Procedure: HERNIA REPAIR UMBILICAL ADULT;  Surgeon: [name], MD;  Location: [place];  Service: General;  Laterality: N/A;  . UMBILICAL HERNIA REPAIR  01/22/2013   preperitoneal open procedure due to significant adhesions/notes 01/22/2013  . VENTRAL HERNIA REPAIR N/A 01/22/2013   Procedure: ATTEMPTED LAPAROSCOPIC VENTRAL HERNIA CONVERTED TO OPEN;  Surgeon: [name], MD;  Location: [place];  Service: General;  Laterality: N/A;    MEDICATIONS: No current facility-administered medications for this encounter.   [name] acyclovir (ZOVIRAX) 400 MG tablet  . aspirin EC 81 MG EC tablet  . atorvastatin (LIPITOR) 40 MG tablet  . dorzolamide-timolol (COSOPT) 22.3-6.8 MG/ML ophthalmic solution  . nitroGLYCERIN (NITROSTAT) 0.4 MG SL tablet  . prednisoLONE acetate (PRED FORTE) 1 % ophthalmic suspension  . sevelamer carbonate (RENVELA) 800 MG tablet  . HYDROcodone-acetaminophen (NORCO) 5-325 MG tablet    Myra Gianotti, PA-C Surgical Short Stay/Anesthesiology [facility] Phone [phone] [facility] Phone [phone] 05/28/2019  1:26 PM

## 2019-05-29 ENCOUNTER — Other Ambulatory Visit (HOSPITAL_COMMUNITY): Payer: Medicare Other

## 2019-05-29 ENCOUNTER — Inpatient Hospital Stay (HOSPITAL_COMMUNITY): Payer: Medicare Other

## 2019-05-29 ENCOUNTER — Inpatient Hospital Stay (HOSPITAL_COMMUNITY)
Admission: RE | Admit: 2019-05-29 | Discharge: 2019-06-04 | DRG: 682 | Disposition: A | Discharge status: Rehabilitation Facility | Payer: Medicare Other | Attending: Family Medicine | Admitting: Family Medicine

## 2019-05-29 ENCOUNTER — Ambulatory Visit (HOSPITAL_COMMUNITY): Payer: Medicare Other | Admitting: Vascular Surgery

## 2019-05-29 ENCOUNTER — Encounter (HOSPITAL_COMMUNITY): Payer: Self-pay | Admitting: Vascular Surgery

## 2019-05-29 ENCOUNTER — Other Ambulatory Visit: Payer: Self-pay

## 2019-05-29 ENCOUNTER — Encounter (HOSPITAL_COMMUNITY)
Admission: RE | Disposition: A | Discharge status: Rehabilitation Facility | Payer: Self-pay | Source: Home / Self Care | Attending: Pulmonary Disease

## 2019-05-29 DIAGNOSIS — Z87442 Personal history of urinary calculi: Secondary | ICD-10-CM | POA: Diagnosis not present

## 2019-05-29 DIAGNOSIS — R29810 Facial weakness: Secondary | ICD-10-CM | POA: Diagnosis not present

## 2019-05-29 DIAGNOSIS — R34 Anuria and oliguria: Secondary | ICD-10-CM | POA: Diagnosis present

## 2019-05-29 DIAGNOSIS — Q613 Polycystic kidney, unspecified: Secondary | ICD-10-CM | POA: Diagnosis not present

## 2019-05-29 DIAGNOSIS — R4020 Unspecified coma: Secondary | ICD-10-CM | POA: Diagnosis not present

## 2019-05-29 DIAGNOSIS — R5381 Other malaise: Secondary | ICD-10-CM | POA: Diagnosis not present

## 2019-05-29 DIAGNOSIS — E785 Hyperlipidemia, unspecified: Secondary | ICD-10-CM | POA: Diagnosis present

## 2019-05-29 DIAGNOSIS — J9601 Acute respiratory failure with hypoxia: Secondary | ICD-10-CM | POA: Diagnosis not present

## 2019-05-29 DIAGNOSIS — R4781 Slurred speech: Secondary | ICD-10-CM | POA: Diagnosis not present

## 2019-05-29 DIAGNOSIS — I420 Dilated cardiomyopathy: Secondary | ICD-10-CM | POA: Diagnosis not present

## 2019-05-29 DIAGNOSIS — N186 End stage renal disease: Secondary | ICD-10-CM

## 2019-05-29 DIAGNOSIS — I361 Nonrheumatic tricuspid (valve) insufficiency: Secondary | ICD-10-CM | POA: Diagnosis not present

## 2019-05-29 DIAGNOSIS — Z9889 Other specified postprocedural states: Secondary | ICD-10-CM | POA: Diagnosis not present

## 2019-05-29 DIAGNOSIS — Z8349 Family history of other endocrine, nutritional and metabolic diseases: Secondary | ICD-10-CM

## 2019-05-29 DIAGNOSIS — I472 Ventricular tachycardia: Secondary | ICD-10-CM | POA: Diagnosis not present

## 2019-05-29 DIAGNOSIS — Z8249 Family history of ischemic heart disease and other diseases of the circulatory system: Secondary | ICD-10-CM

## 2019-05-29 DIAGNOSIS — D72819 Decreased white blood cell count, unspecified: Secondary | ICD-10-CM | POA: Diagnosis not present

## 2019-05-29 DIAGNOSIS — I10 Essential (primary) hypertension: Secondary | ICD-10-CM | POA: Diagnosis not present

## 2019-05-29 DIAGNOSIS — G4733 Obstructive sleep apnea (adult) (pediatric): Secondary | ICD-10-CM | POA: Diagnosis present

## 2019-05-29 DIAGNOSIS — I120 Hypertensive chronic kidney disease with stage 5 chronic kidney disease or end stage renal disease: Principal | ICD-10-CM | POA: Diagnosis present

## 2019-05-29 DIAGNOSIS — Z8674 Personal history of sudden cardiac arrest: Secondary | ICD-10-CM | POA: Diagnosis not present

## 2019-05-29 DIAGNOSIS — I97711 Intraoperative cardiac arrest during other surgery: Secondary | ICD-10-CM | POA: Diagnosis not present

## 2019-05-29 DIAGNOSIS — E213 Hyperparathyroidism, unspecified: Secondary | ICD-10-CM | POA: Diagnosis present

## 2019-05-29 DIAGNOSIS — Z823 Family history of stroke: Secondary | ICD-10-CM | POA: Diagnosis not present

## 2019-05-29 DIAGNOSIS — R531 Weakness: Secondary | ICD-10-CM | POA: Diagnosis present

## 2019-05-29 DIAGNOSIS — T4275XA Adverse effect of unspecified antiepileptic and sedative-hypnotic drugs, initial encounter: Secondary | ICD-10-CM | POA: Diagnosis not present

## 2019-05-29 DIAGNOSIS — I469 Cardiac arrest, cause unspecified: Secondary | ICD-10-CM | POA: Diagnosis not present

## 2019-05-29 DIAGNOSIS — H3500 Unspecified background retinopathy: Secondary | ICD-10-CM | POA: Diagnosis present

## 2019-05-29 DIAGNOSIS — D638 Anemia in other chronic diseases classified elsewhere: Secondary | ICD-10-CM | POA: Diagnosis not present

## 2019-05-29 DIAGNOSIS — Q612 Polycystic kidney, adult type: Secondary | ICD-10-CM | POA: Diagnosis not present

## 2019-05-29 DIAGNOSIS — I429 Cardiomyopathy, unspecified: Secondary | ICD-10-CM | POA: Diagnosis not present

## 2019-05-29 DIAGNOSIS — Z452 Encounter for adjustment and management of vascular access device: Secondary | ICD-10-CM | POA: Diagnosis not present

## 2019-05-29 DIAGNOSIS — G311 Senile degeneration of brain, not elsewhere classified: Secondary | ICD-10-CM | POA: Diagnosis not present

## 2019-05-29 DIAGNOSIS — Y92234 Operating room of hospital as the place of occurrence of the external cause: Secondary | ICD-10-CM | POA: Diagnosis not present

## 2019-05-29 DIAGNOSIS — D696 Thrombocytopenia, unspecified: Secondary | ICD-10-CM | POA: Diagnosis present

## 2019-05-29 DIAGNOSIS — D709 Neutropenia, unspecified: Secondary | ICD-10-CM | POA: Diagnosis present

## 2019-05-29 DIAGNOSIS — I428 Other cardiomyopathies: Secondary | ICD-10-CM

## 2019-05-29 DIAGNOSIS — Z7982 Long term (current) use of aspirin: Secondary | ICD-10-CM

## 2019-05-29 DIAGNOSIS — Z992 Dependence on renal dialysis: Secondary | ICD-10-CM

## 2019-05-29 DIAGNOSIS — J90 Pleural effusion, not elsewhere classified: Secondary | ICD-10-CM | POA: Diagnosis not present

## 2019-05-29 DIAGNOSIS — D631 Anemia in chronic kidney disease: Secondary | ICD-10-CM | POA: Diagnosis present

## 2019-05-29 DIAGNOSIS — G931 Anoxic brain damage, not elsewhere classified: Secondary | ICD-10-CM | POA: Diagnosis not present

## 2019-05-29 DIAGNOSIS — Z8679 Personal history of other diseases of the circulatory system: Secondary | ICD-10-CM | POA: Diagnosis not present

## 2019-05-29 DIAGNOSIS — Z781 Physical restraint status: Secondary | ICD-10-CM

## 2019-05-29 DIAGNOSIS — Z20822 Contact with and (suspected) exposure to covid-19: Secondary | ICD-10-CM | POA: Diagnosis present

## 2019-05-29 DIAGNOSIS — Z5309 Procedure and treatment not carried out because of other contraindication: Secondary | ICD-10-CM | POA: Diagnosis not present

## 2019-05-29 DIAGNOSIS — N2581 Secondary hyperparathyroidism of renal origin: Secondary | ICD-10-CM | POA: Diagnosis present

## 2019-05-29 DIAGNOSIS — Z4682 Encounter for fitting and adjustment of non-vascular catheter: Secondary | ICD-10-CM | POA: Diagnosis not present

## 2019-05-29 DIAGNOSIS — R269 Unspecified abnormalities of gait and mobility: Secondary | ICD-10-CM | POA: Diagnosis present

## 2019-05-29 DIAGNOSIS — R451 Restlessness and agitation: Secondary | ICD-10-CM | POA: Diagnosis not present

## 2019-05-29 DIAGNOSIS — I471 Supraventricular tachycardia: Secondary | ICD-10-CM | POA: Diagnosis not present

## 2019-05-29 DIAGNOSIS — Z79899 Other long term (current) drug therapy: Secondary | ICD-10-CM | POA: Diagnosis not present

## 2019-05-29 DIAGNOSIS — R2681 Unsteadiness on feet: Secondary | ICD-10-CM | POA: Diagnosis present

## 2019-05-29 DIAGNOSIS — Z841 Family history of disorders of kidney and ureter: Secondary | ICD-10-CM | POA: Diagnosis not present

## 2019-05-29 DIAGNOSIS — T82898A Other specified complication of vascular prosthetic devices, implants and grafts, initial encounter: Secondary | ICD-10-CM | POA: Diagnosis not present

## 2019-05-29 DIAGNOSIS — R29818 Other symptoms and signs involving the nervous system: Secondary | ICD-10-CM | POA: Diagnosis not present

## 2019-05-29 DIAGNOSIS — I351 Nonrheumatic aortic (valve) insufficiency: Secondary | ICD-10-CM | POA: Diagnosis not present

## 2019-05-29 LAB — BASIC METABOLIC PANEL
Anion gap: 17 — ABNORMAL HIGH (ref 5–15)
BUN: 35 mg/dL — ABNORMAL HIGH (ref 8–23)
CO2: 22 mmol/L (ref 22–32)
Calcium: 8.8 mg/dL — ABNORMAL LOW (ref 8.9–10.3)
Chloride: 102 mmol/L (ref 98–111)
Creatinine, Ser: 10 mg/dL — ABNORMAL HIGH (ref 0.61–1.24)
GFR calc Af Amer: 6 mL/min — ABNORMAL LOW (ref >60)
GFR calc non Af Amer: 5 mL/min — ABNORMAL LOW (ref >60)
Glucose, Bld: 156 mg/dL — ABNORMAL HIGH (ref 70–99)
Potassium: 5.4 mmol/L — ABNORMAL HIGH (ref 3.5–5.1)
Sodium: 141 mmol/L (ref 135–145)

## 2019-05-29 LAB — POCT I-STAT 7, (LYTES, BLD GAS, ICA,H+H)
Acid-base deficit: 3 mmol/L — ABNORMAL HIGH (ref 0.0–2.0)
Acid-base deficit: 4 mmol/L — ABNORMAL HIGH (ref 0.0–2.0)
Acid-base deficit: 2 mmol/L (ref 0.0–2.0)
Bicarbonate: 24 mmol/L (ref 20.0–28.0)
Bicarbonate: 25.6 mmol/L (ref 20.0–28.0)
Bicarbonate: 23.4 mmol/L (ref 20.0–28.0)
Calcium, Ion: 1.12 mmol/L — ABNORMAL LOW (ref 1.15–1.40)
Calcium, Ion: 1.16 mmol/L (ref 1.15–1.40)
Calcium, Ion: 1.11 mmol/L — ABNORMAL LOW (ref 1.15–1.40)
HCT: 45 % (ref 39.0–52.0)
HCT: 50 % (ref 39.0–52.0)
HCT: 47 % (ref 39.0–52.0)
Hemoglobin: 15.3 g/dL (ref 13.0–17.0)
Hemoglobin: 17 g/dL (ref 13.0–17.0)
Hemoglobin: 16 g/dL (ref 13.0–17.0)
O2 Saturation: 99 %
O2 Saturation: 100 %
O2 Saturation: 100 %
Patient temperature: 98
Patient temperature: 35.9
Patient temperature: 36
Potassium: 4.5 mmol/L (ref 3.5–5.1)
Potassium: 5.3 mmol/L — ABNORMAL HIGH (ref 3.5–5.1)
Potassium: 5.2 mmol/L — ABNORMAL HIGH (ref 3.5–5.1)
Sodium: 136 mmol/L (ref 135–145)
Sodium: 136 mmol/L (ref 135–145)
Sodium: 135 mmol/L (ref 135–145)
TCO2: 25 mmol/L (ref 22–32)
TCO2: 27 mmol/L (ref 22–32)
TCO2: 25 mmol/L (ref 22–32)
pCO2 arterial: 47.2 mmHg (ref 32.0–48.0)
pCO2 arterial: 60.4 mmHg — ABNORMAL HIGH (ref 32.0–48.0)
pCO2 arterial: 40 mmHg (ref 32.0–48.0)
pH, Arterial: 7.313 — ABNORMAL LOW (ref 7.350–7.450)
pH, Arterial: 7.229 — ABNORMAL LOW (ref 7.350–7.450)
pH, Arterial: 7.371 (ref 7.350–7.450)
pO2, Arterial: 125 mmHg — ABNORMAL HIGH (ref 83.0–108.0)
pO2, Arterial: 345 mmHg — ABNORMAL HIGH (ref 83.0–108.0)
pO2, Arterial: 189 mmHg — ABNORMAL HIGH (ref 83.0–108.0)

## 2019-05-29 LAB — CBC WITH DIFFERENTIAL/PLATELET
Abs Immature Granulocytes: 0.04 10*3/uL (ref 0.00–0.07)
Basophils Absolute: 0 10*3/uL (ref 0.0–0.1)
Basophils Relative: 0 %
Eosinophils Absolute: 0 10*3/uL (ref 0.0–0.5)
Eosinophils Relative: 1 %
HCT: 48.9 % (ref 39.0–52.0)
Hemoglobin: 14.4 g/dL (ref 13.0–17.0)
Immature Granulocytes: 1 %
Lymphocytes Relative: 4 %
Lymphs Abs: 0.3 10*3/uL — ABNORMAL LOW (ref 0.7–4.0)
MCH: 25.4 pg — ABNORMAL LOW (ref 26.0–34.0)
MCHC: 29.4 g/dL — ABNORMAL LOW (ref 30.0–36.0)
MCV: 86.4 fL (ref 80.0–100.0)
Monocytes Absolute: 0.6 10*3/uL (ref 0.1–1.0)
Monocytes Relative: 9 %
Neutro Abs: 5.8 10*3/uL (ref 1.7–7.7)
Neutrophils Relative %: 85 %
Platelets: 179 10*3/uL (ref 150–400)
RBC: 5.66 MIL/uL (ref 4.22–5.81)
RDW: 19.6 % — ABNORMAL HIGH (ref 11.5–15.5)
WBC: 6.9 10*3/uL (ref 4.0–10.5)
nRBC: 0 % (ref 0.0–0.2)

## 2019-05-29 LAB — COMPREHENSIVE METABOLIC PANEL
ALT: 11 U/L (ref 0–44)
AST: 22 U/L (ref 15–41)
Albumin: 3.1 g/dL — ABNORMAL LOW (ref 3.5–5.0)
Alkaline Phosphatase: 34 U/L — ABNORMAL LOW (ref 38–126)
Anion gap: 18 — ABNORMAL HIGH (ref 5–15)
BUN: 39 mg/dL — ABNORMAL HIGH (ref 8–23)
CO2: 25 mmol/L (ref 22–32)
Calcium: 9 mg/dL (ref 8.9–10.3)
Chloride: 99 mmol/L (ref 98–111)
Creatinine, Ser: 10.79 mg/dL — ABNORMAL HIGH (ref 0.61–1.24)
GFR calc Af Amer: 5 mL/min — ABNORMAL LOW (ref >60)
GFR calc non Af Amer: 5 mL/min — ABNORMAL LOW (ref >60)
Glucose, Bld: 75 mg/dL (ref 70–99)
Potassium: 5.2 mmol/L — ABNORMAL HIGH (ref 3.5–5.1)
Sodium: 142 mmol/L (ref 135–145)
Total Bilirubin: 0.9 mg/dL (ref 0.3–1.2)
Total Protein: 5.8 g/dL — ABNORMAL LOW (ref 6.5–8.1)

## 2019-05-29 LAB — PROTIME-INR
INR: 1.4 — ABNORMAL HIGH (ref 0.8–1.2)
Prothrombin Time: 17.1 seconds — ABNORMAL HIGH (ref 11.4–15.2)

## 2019-05-29 LAB — LACTIC ACID, PLASMA
Lactic Acid, Venous: 3.3 mmol/L (ref 0.5–1.9)
Lactic Acid, Venous: 1.9 mmol/L (ref 0.5–1.9)

## 2019-05-29 LAB — POCT I-STAT, CHEM 8
BUN: 34 mg/dL — ABNORMAL HIGH (ref 8–23)
Calcium, Ion: 1.09 mmol/L — ABNORMAL LOW (ref 1.15–1.40)
Chloride: 101 mmol/L (ref 98–111)
Creatinine, Ser: 10.5 mg/dL — ABNORMAL HIGH (ref 0.61–1.24)
Glucose, Bld: 81 mg/dL (ref 70–99)
HCT: 46 % (ref 39.0–52.0)
Hemoglobin: 15.6 g/dL (ref 13.0–17.0)
Potassium: 4 mmol/L (ref 3.5–5.1)
Sodium: 137 mmol/L (ref 135–145)
TCO2: 25 mmol/L (ref 22–32)

## 2019-05-29 LAB — TROPONIN I (HIGH SENSITIVITY)
Troponin I (High Sensitivity): 85 ng/L — ABNORMAL HIGH (ref <18)
Troponin I (High Sensitivity): 658 ng/L (ref <18)
Troponin I (High Sensitivity): 977 ng/L (ref <18)
Troponin I (High Sensitivity): 1392 ng/L (ref <18)

## 2019-05-29 LAB — GLUCOSE, CAPILLARY
Glucose-Capillary: 63 mg/dL — ABNORMAL LOW (ref 70–99)
Glucose-Capillary: 134 mg/dL — ABNORMAL HIGH (ref 70–99)
Glucose-Capillary: 64 mg/dL — ABNORMAL LOW (ref 70–99)
Glucose-Capillary: 94 mg/dL (ref 70–99)

## 2019-05-29 LAB — APTT: aPTT: 39 seconds — ABNORMAL HIGH (ref 24–36)

## 2019-05-29 LAB — HIV ANTIBODY (ROUTINE TESTING W REFLEX): HIV Screen 4th Generation wRfx: NONREACTIVE

## 2019-05-29 IMAGING — DX DG CHEST 1V PORT
1 series · 1 of 1 positions shown · non-contrast
Comparison: Chest x-ray dated [DATE].

CLINICAL DATA: Intubation and line placement.

EXAM:
PORTABLE CHEST 1 VIEW

[chest ap]
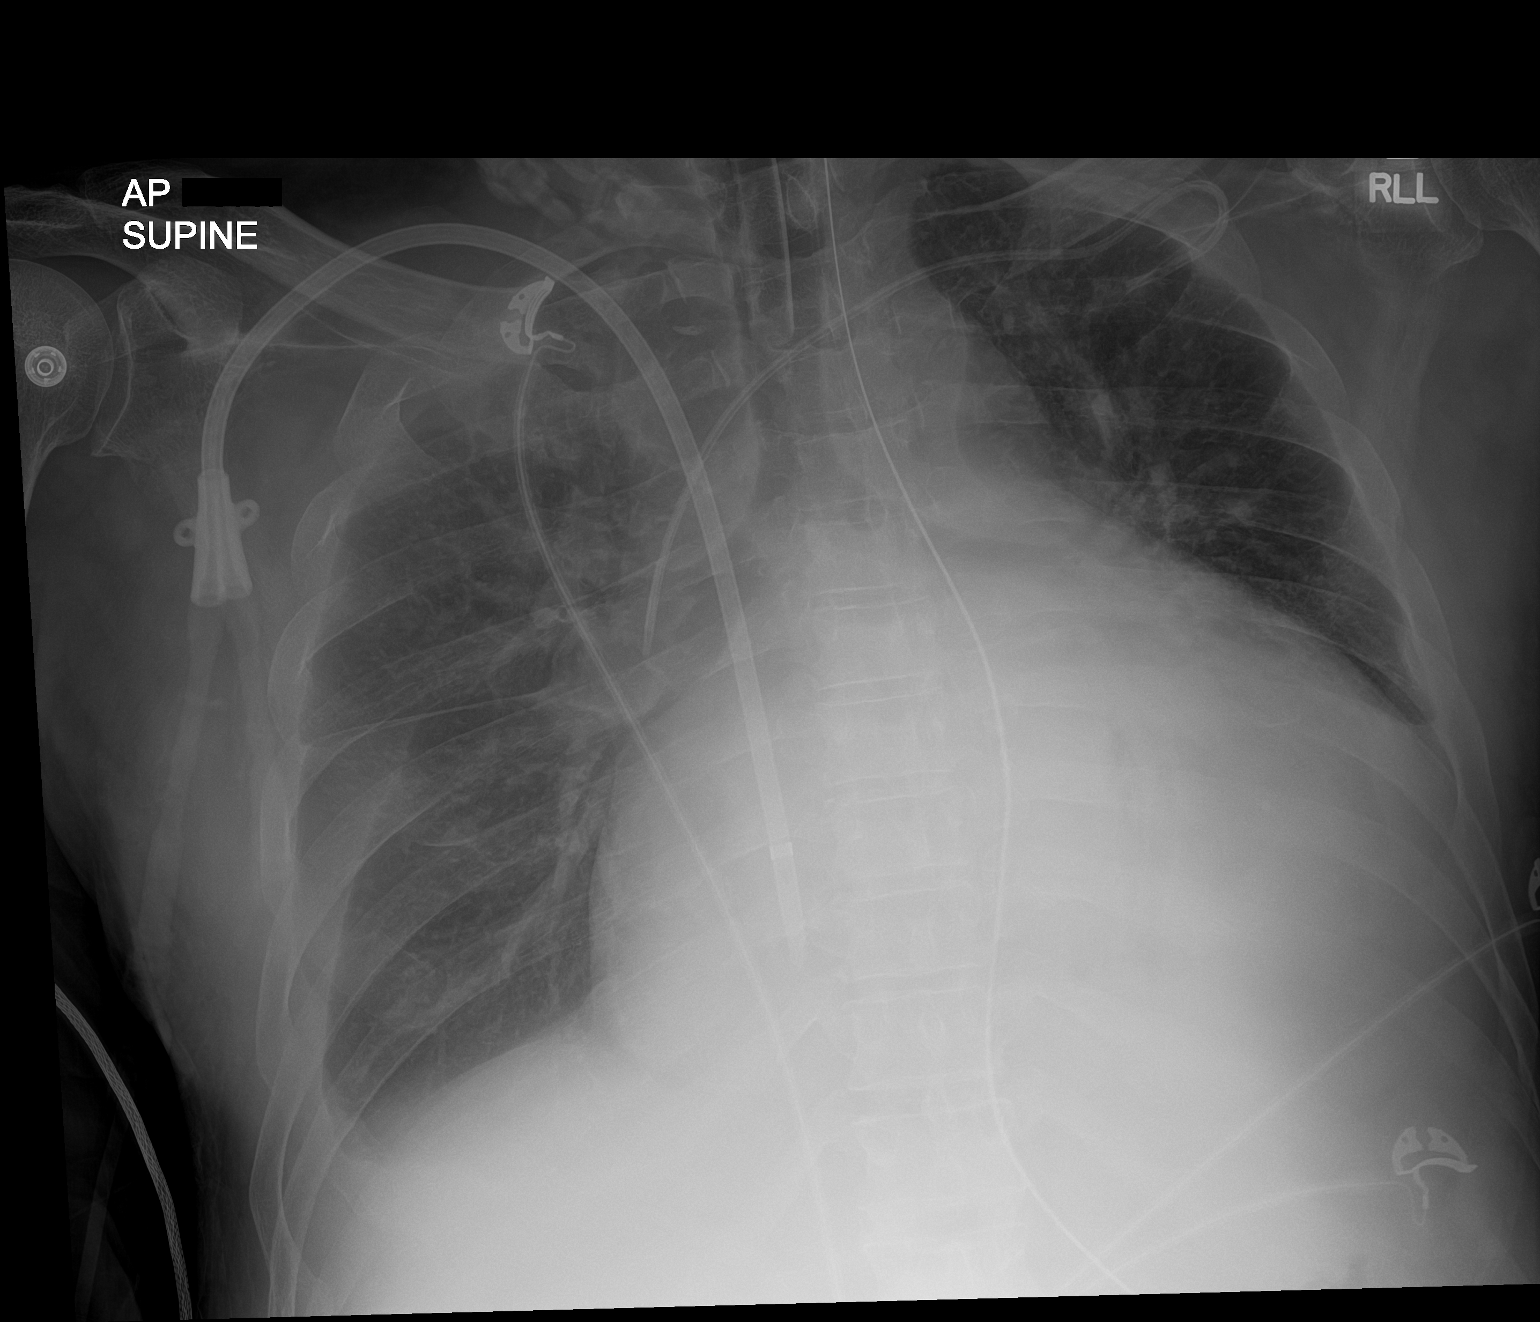

[1 of 1 positions shown; findings below may reference images not displayed]

FINDINGS: Endotracheal tube in good position with the tip 4.5 cm above the
carina. Enteric tube entering the stomach with the tip below the
field of few. Left subclavian central venous catheter with tip in
the distal SVC. Tunneled right internal jugular dialysis catheter
with tip in the right atrium.

Unchanged moderate cardiomegaly. Pulmonary vascular congestion.
Small bilateral pleural effusions. Left basilar atelectasis. No
pneumothorax. No acute osseous abnormality.
IMPRESSION: 1. Appropriately positioned lines and tubes.
2. Moderate cardiomegaly with pulmonary vascular congestion and
small bilateral pleural effusions.

## 2019-05-29 IMAGING — CT CT HEAD W/O CM
4 series · 17 of 47 positions shown, 19 images · non-contrast
Comparison: Head CT [DATE].

CLINICAL DATA: 63-year-old male status post intraoperative cardiac
arrest today.

EXAM:
CT HEAD WITHOUT CONTRAST
TECHNIQUE: Contiguous axial images were obtained from the base of the skull
through the vertex without intravenous contrast.

[Series 2: head without · axial · non-contrast · 0.41mm/px · z∈[-93,+17]mm · 6 of 32 slices shown, 8 images]
[im 5/32  brain]
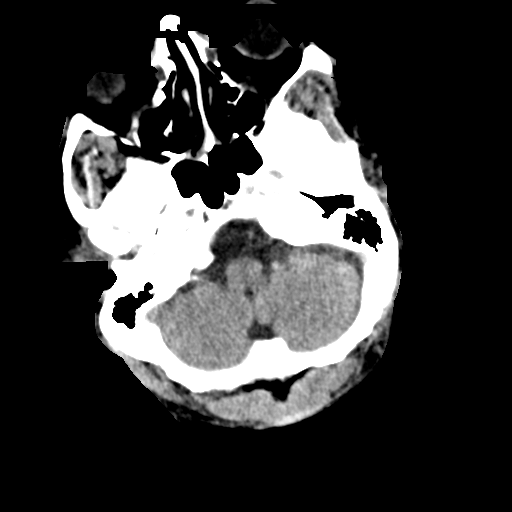
[im 5/32  bone]
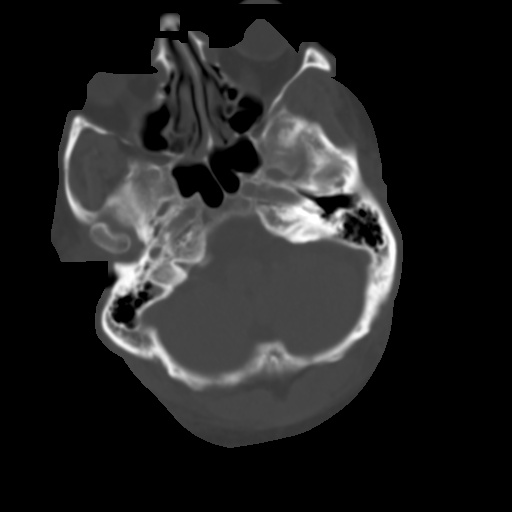
[im 9/32  brain]
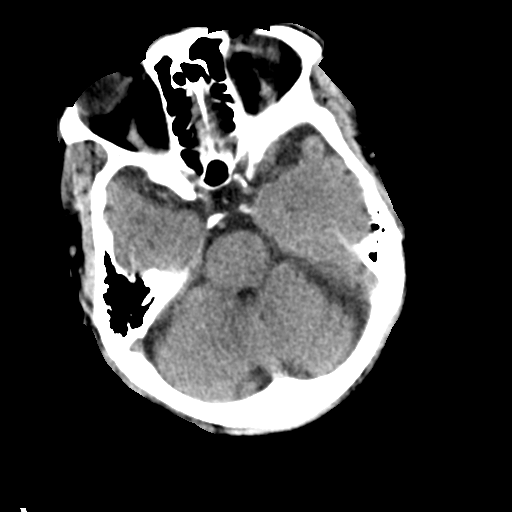
[im 14/32  brain]
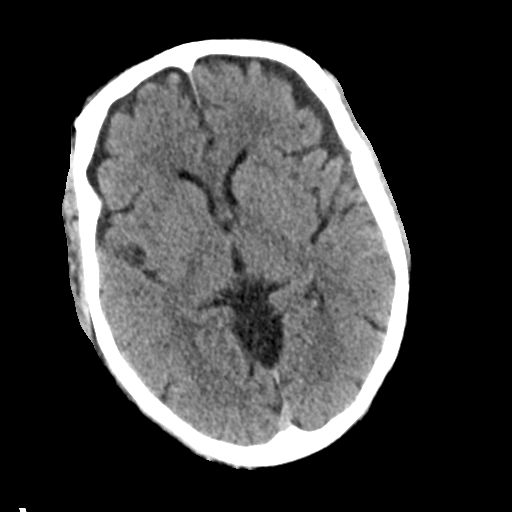
[im 18/32  brain]
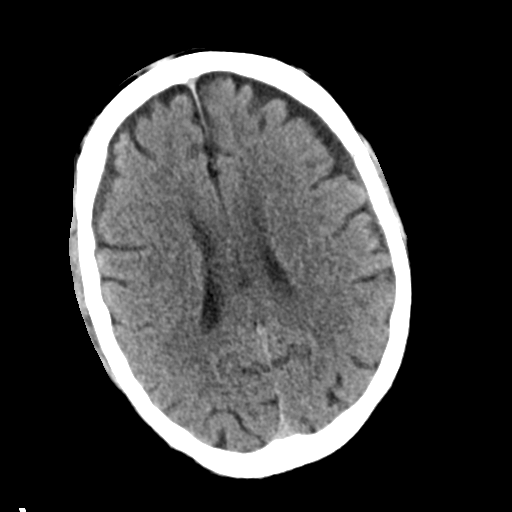
[im 23/32  brain]
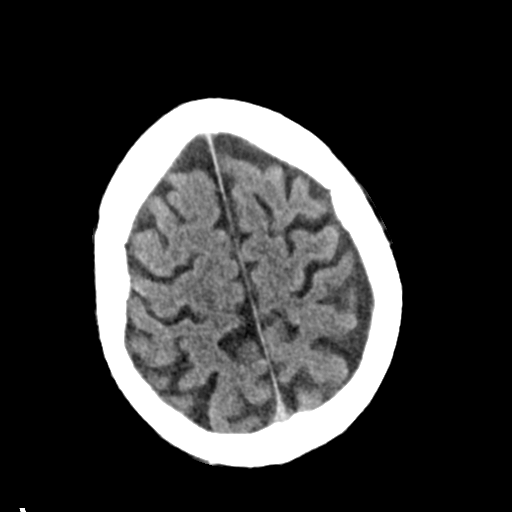
[im 23/32  bone]
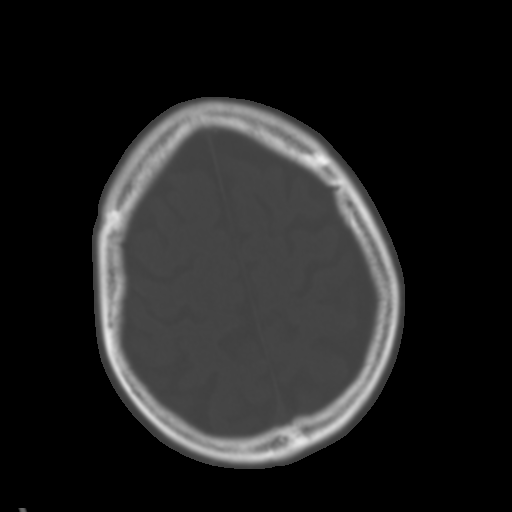
[im 27/32  brain]
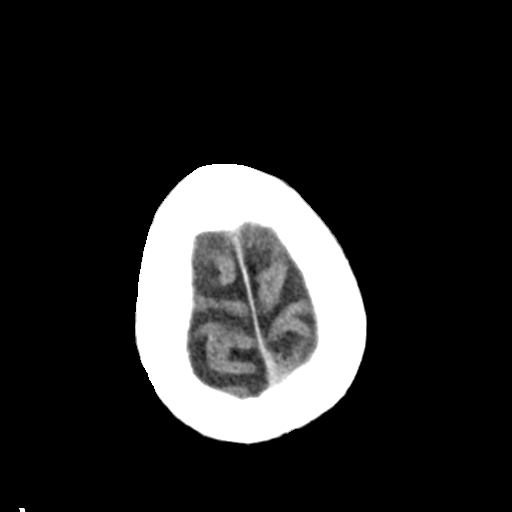

[Series 3: head bone · axial · 0.41mm/px · z∈[-99,-21]mm · 5 of 82 slices shown]
[im 8/82  bone]
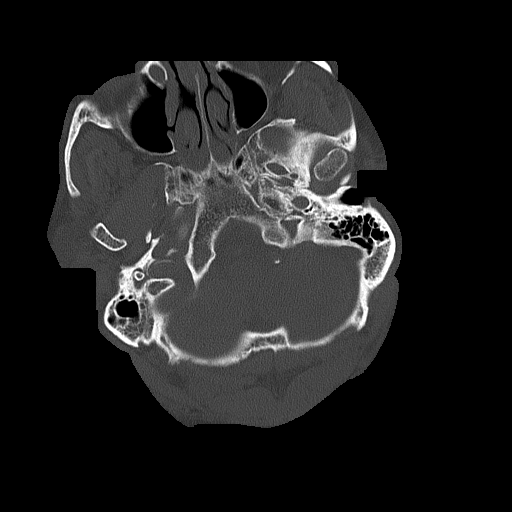
[im 16/82  bone]
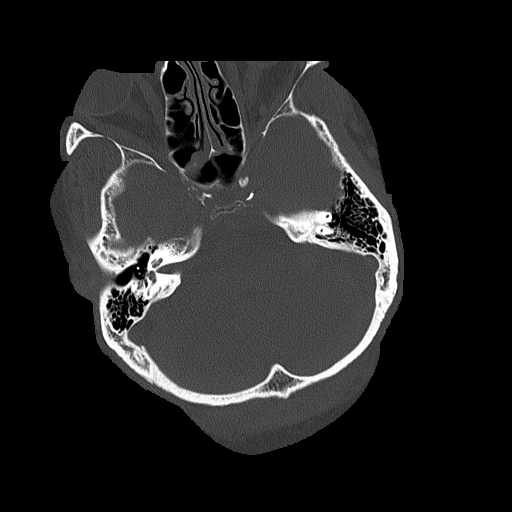
[im 28/82  bone]
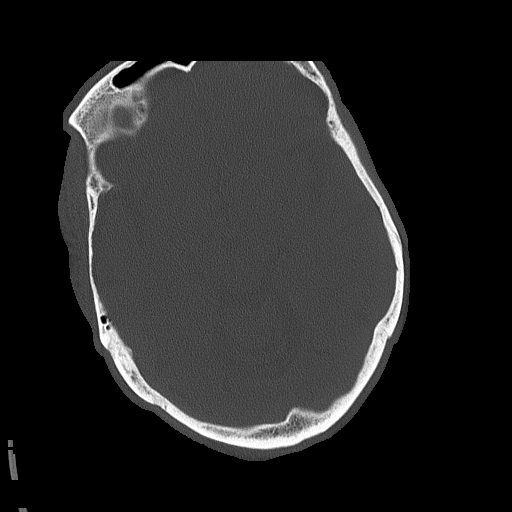
[im 35/82  bone]
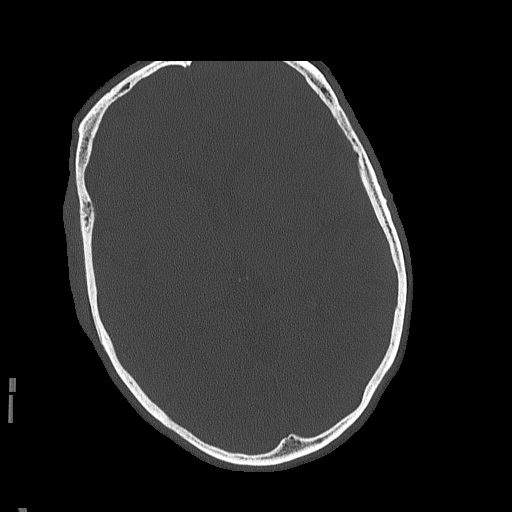
[im 47/82  bone]
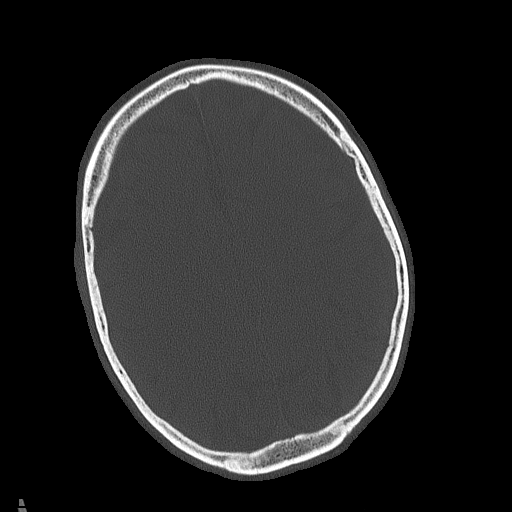

[Series 4: head without cor · coronal · non-contrast · 0.34mm/px · 3 of 67 slices shown]
[im 23/67  brain]
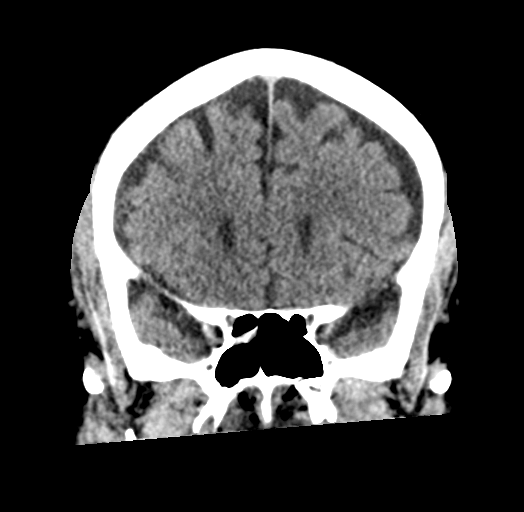
[im 30/67  brain]
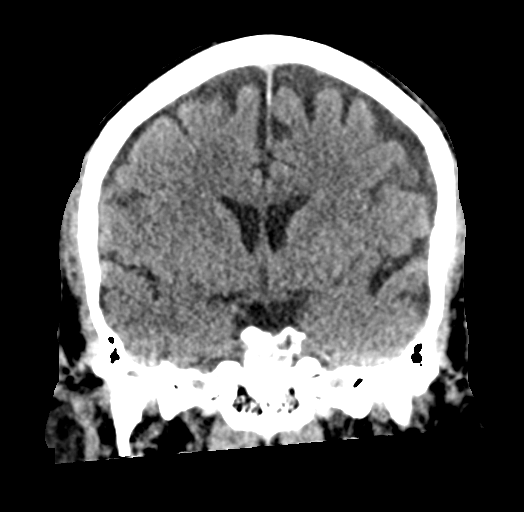
[im 37/67  brain]
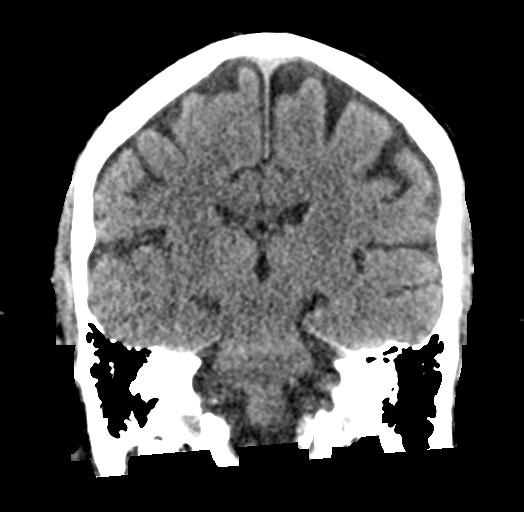

[Series 5: head without sag · sagittal · non-contrast · 0.32mm/px · 3 of 57 slices shown]
[im 19/57  brain]
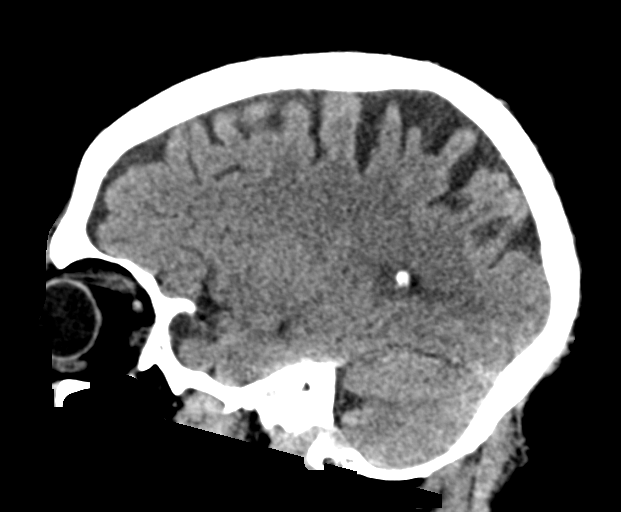
[im 29/57  brain]
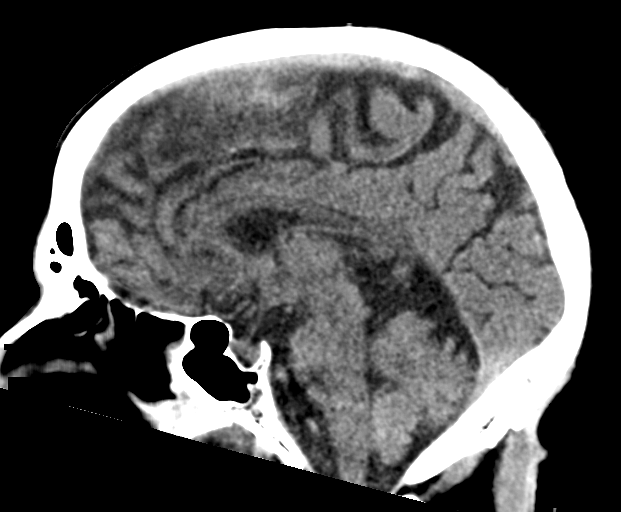
[im 38/57  brain]
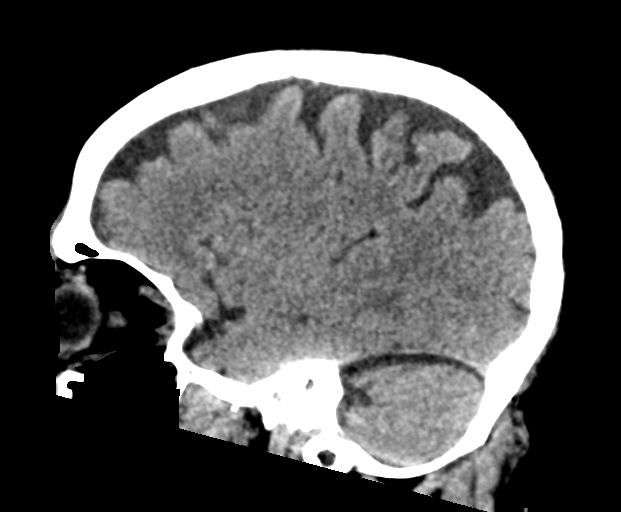

[17 of 47 positions shown; findings below may reference images not displayed]

FINDINGS: Brain: Cerebral volume loss since [U3] appears generalized. No
midline shift, ventriculomegaly, mass effect, evidence of mass
lesion, intracranial hemorrhage or evidence of cortically based
acute infarction.

Gray-white matter differentiation remains within normal limits.

Vascular: Calcified atherosclerosis at the skull base. No suspicious
intracranial vascular hyperdensity.

Skull: No acute osseous abnormality identified.

Sinuses/Orbits: Visualized paranasal sinuses and mastoids are stable
and well pneumatized.

Other: No acute orbit or scalp soft tissue findings. Disconjugate
gaze.

Partially visible fluid in the pharynx. The patient is intubated on
the scalp view.
IMPRESSION: Generalized cerebral volume loss since [U3] but otherwise
noncontrast CT appearance of the brain is within normal limits.

## 2019-05-29 SURGERY — CANCELLED PROCEDURE
Anesthesia: Monitor Anesthesia Care

## 2019-05-29 MED ORDER — SEVELAMER CARBONATE 800 MG PO TABS: 1600.0000 mg | ORAL_TABLET | Freq: Three times a day (TID) | ORAL | Status: DC

## 2019-05-29 MED ORDER — HEPARIN SODIUM (PORCINE) 1000 UNIT/ML DIALYSIS
1000.0000 [IU] | INTRAMUSCULAR | Status: DC | PRN
  Administered 2019-05-29: 3800 [IU] via INTRAVENOUS_CENTRAL
  Filled 2019-05-29: qty 6
  Filled 2019-05-29: qty 4
  Filled 2019-05-29: qty 6

## 2019-05-29 MED ORDER — PHENYLEPHRINE 40 MCG/ML (10ML) SYRINGE FOR IV PUSH (FOR BLOOD PRESSURE SUPPORT)
PREFILLED_SYRINGE | INTRAVENOUS | Status: AC
  Filled 2019-05-29: qty 10

## 2019-05-29 MED ORDER — 0.9 % SODIUM CHLORIDE (POUR BTL) OPTIME
TOPICAL | Status: DC | PRN
  Administered 2019-05-29: 10:00:00 1000 mL

## 2019-05-29 MED ORDER — HEPARIN SODIUM (PORCINE) 5000 UNIT/ML IJ SOLN
5000.0000 [IU] | Freq: Three times a day (TID) | INTRAMUSCULAR | Status: DC
  Administered 2019-05-29 – 2019-06-01 (×10): 5000 [IU] via SUBCUTANEOUS
  Filled 2019-05-29 (×9): qty 1

## 2019-05-29 MED ORDER — SODIUM CHLORIDE 0.9 % IV SOLN
INTRAVENOUS | Status: DC
  Administered 2019-05-29: 10 mL/h via INTRAVENOUS

## 2019-05-29 MED ORDER — FLUMAZENIL 0.5 MG/5ML IV SOLN
INTRAVENOUS | Status: AC
  Filled 2019-05-29: qty 5

## 2019-05-29 MED ORDER — MIDAZOLAM HCL 5 MG/5ML IJ SOLN
INTRAMUSCULAR | Status: DC | PRN
  Administered 2019-05-29 (×2): 1 mg via INTRAVENOUS

## 2019-05-29 MED ORDER — CHLORHEXIDINE GLUCONATE CLOTH 2 % EX PADS
6.0000 | MEDICATED_PAD | Freq: Every day | CUTANEOUS | Status: DC
  Administered 2019-05-30 – 2019-06-02 (×2): 6 via TOPICAL

## 2019-05-29 MED ORDER — DEXTROSE-NACL 5-0.9 % IV SOLN: INTRAVENOUS | Status: DC

## 2019-05-29 MED ORDER — HYDROMORPHONE HCL 1 MG/ML IJ SOLN
2.0000 mg | Freq: Once | INTRAMUSCULAR | Status: AC
  Administered 2019-05-29: 2 mg via INTRAVENOUS

## 2019-05-29 MED ORDER — EPINEPHRINE 1 MG/10ML IJ SOSY
PREFILLED_SYRINGE | INTRAMUSCULAR | Status: AC
  Filled 2019-05-29: qty 20

## 2019-05-29 MED ORDER — SODIUM CHLORIDE 0.9 % IV SOLN: INTRAVENOUS | Status: DC | PRN

## 2019-05-29 MED ORDER — EPHEDRINE SULFATE 50 MG/ML IJ SOLN
INTRAMUSCULAR | Status: DC | PRN
  Administered 2019-05-29: 10 mg via INTRAVENOUS

## 2019-05-29 MED ORDER — SODIUM CHLORIDE 0.9 % IV SOLN
INTRAVENOUS | Status: AC
  Filled 2019-05-29: qty 1.2

## 2019-05-29 MED ORDER — PREDNISOLONE ACETATE 1 % OP SUSP
1.0000 [drp] | Freq: Every day | OPHTHALMIC | Status: DC
  Administered 2019-05-30 – 2019-06-04 (×6): 1 [drp] via OPHTHALMIC
  Filled 2019-05-29: qty 5

## 2019-05-29 MED ORDER — SEVELAMER CARBONATE 800 MG PO TABS
1600.0000 mg | ORAL_TABLET | Freq: Three times a day (TID) | ORAL | Status: DC
  Administered 2019-05-29 – 2019-05-31 (×5): 1600 mg
  Filled 2019-05-29 (×6): qty 2

## 2019-05-29 MED ORDER — DEXTROSE 50 % IV SOLN
INTRAVENOUS | Status: AC
  Administered 2019-05-29: 25 mL
  Filled 2019-05-29: qty 50

## 2019-05-29 MED ORDER — HYDROMORPHONE HCL 1 MG/ML IJ SOLN
INTRAMUSCULAR | Status: AC
  Filled 2019-05-29: qty 2

## 2019-05-29 MED ORDER — BISACODYL 10 MG RE SUPP: 10.0000 mg | Freq: Every day | RECTAL | Status: DC | PRN

## 2019-05-29 MED ORDER — HYDROMORPHONE BOLUS VIA INFUSION
0.2000 mg | INTRAVENOUS | Status: DC | PRN
  Administered 2019-05-29: 0.2 mg via INTRAVENOUS
  Filled 2019-05-29: qty 1

## 2019-05-29 MED ORDER — ACYCLOVIR 400 MG PO TABS
400.0000 mg | ORAL_TABLET | Freq: Every day | ORAL | Status: DC
  Administered 2019-05-30 – 2019-06-04 (×5): 400 mg via ORAL
  Filled 2019-05-29 (×7): qty 1

## 2019-05-29 MED ORDER — CHLORHEXIDINE GLUCONATE 4 % EX LIQD: 60.0000 mL | Freq: Once | CUTANEOUS | Status: DC

## 2019-05-29 MED ORDER — PROPOFOL 1000 MG/100ML IV EMUL
0.0000 ug/kg/min | INTRAVENOUS | Status: DC
  Administered 2019-05-29: 20 ug/kg/min via INTRAVENOUS
  Administered 2019-05-29: 30 ug/kg/min via INTRAVENOUS
  Administered 2019-05-30: 20 ug/kg/min via INTRAVENOUS
  Administered 2019-05-31: 10 ug/kg/min via INTRAVENOUS
  Filled 2019-05-29 (×8): qty 100

## 2019-05-29 MED ORDER — MIDAZOLAM HCL 2 MG/2ML IJ SOLN
INTRAMUSCULAR | Status: AC
  Filled 2019-05-29: qty 2

## 2019-05-29 MED ORDER — SODIUM CHLORIDE 0.9 % IV SOLN: INTRAVENOUS | Status: DC

## 2019-05-29 MED ORDER — HEPARIN SODIUM (PORCINE) 5000 UNIT/ML IJ SOLN: 5000.0000 [IU] | Freq: Three times a day (TID) | INTRAMUSCULAR | Status: DC

## 2019-05-29 MED ORDER — DEXTROSE 50 % IV SOLN
12.5000 g | INTRAVENOUS | Status: AC
  Administered 2019-05-29: 12.5 g via INTRAVENOUS

## 2019-05-29 MED ORDER — DORZOLAMIDE HCL-TIMOLOL MAL 2-0.5 % OP SOLN
1.0000 [drp] | Freq: Two times a day (BID) | OPHTHALMIC | Status: DC
  Administered 2019-05-29 – 2019-06-04 (×12): 1 [drp] via OPHTHALMIC
  Filled 2019-05-29: qty 10

## 2019-05-29 MED ORDER — MIDAZOLAM HCL 2 MG/2ML IJ SOLN
2.0000 mg | Freq: Once | INTRAMUSCULAR | Status: AC
  Administered 2019-05-29: 2 mg via INTRAVENOUS

## 2019-05-29 MED ORDER — SODIUM CHLORIDE 0.9 % IV SOLN
INTRAVENOUS | Status: DC | PRN
  Administered 2019-05-29: 500 mL

## 2019-05-29 MED ORDER — CEFAZOLIN SODIUM-DEXTROSE 2-4 GM/100ML-% IV SOLN
2.0000 g | INTRAVENOUS | Status: AC
  Administered 2019-05-29: 2 g via INTRAVENOUS
  Filled 2019-05-29: qty 100

## 2019-05-29 MED ORDER — LIDOCAINE HCL 1 % IJ SOLN
INTRAMUSCULAR | Status: DC | PRN
  Administered 2019-05-29: 30 mL

## 2019-05-29 MED ORDER — MIDAZOLAM HCL 2 MG/2ML IJ SOLN
INTRAMUSCULAR | Status: AC
  Administered 2019-05-29: 2 mg
  Filled 2019-05-29: qty 2

## 2019-05-29 MED ORDER — EPINEPHRINE PF 1 MG/ML IJ SOLN
INTRAMUSCULAR | Status: DC | PRN
  Administered 2019-05-29 (×2): 1 mg via INTRAVENOUS

## 2019-05-29 MED ORDER — DEXTROSE 50 % IV SOLN: 12.5000 g | INTRAVENOUS | Status: AC

## 2019-05-29 MED ORDER — SENNOSIDES 8.8 MG/5ML PO SYRP: 5.0000 mL | ORAL_SOLUTION | Freq: Two times a day (BID) | ORAL | Status: DC | PRN

## 2019-05-29 MED ORDER — PANTOPRAZOLE SODIUM 40 MG PO TBEC: 40.0000 mg | DELAYED_RELEASE_TABLET | Freq: Every day | ORAL | Status: DC

## 2019-05-29 MED ORDER — VASOPRESSIN 20 UNIT/ML IV SOLN
INTRAVENOUS | Status: AC
  Filled 2019-05-29: qty 1

## 2019-05-29 MED ORDER — FENTANYL CITRATE (PF) 100 MCG/2ML IJ SOLN
INTRAMUSCULAR | Status: DC | PRN
  Administered 2019-05-29 (×2): 25 ug via INTRAVENOUS

## 2019-05-29 MED ORDER — ALBUTEROL SULFATE (2.5 MG/3ML) 0.083% IN NEBU
2.5000 mg | INHALATION_SOLUTION | RESPIRATORY_TRACT | Status: DC | PRN
  Administered 2019-05-29: 2.5 mg via RESPIRATORY_TRACT
  Filled 2019-05-29: qty 3

## 2019-05-29 MED ORDER — LIDOCAINE HCL (PF) 1 % IJ SOLN
INTRAMUSCULAR | Status: AC
  Filled 2019-05-29: qty 30

## 2019-05-29 MED ORDER — DEXTROSE 50 % IV SOLN
INTRAVENOUS | Status: AC
  Administered 2019-05-29: 25 mL via INTRAVENOUS
  Filled 2019-05-29: qty 50

## 2019-05-29 MED ORDER — SODIUM CHLORIDE 0.9 % IV SOLN
0.5000 mg/h | INTRAVENOUS | Status: DC
  Administered 2019-05-29: 0.5 mg/h via INTRAVENOUS
  Administered 2019-05-30: 1.5 mg/h via INTRAVENOUS
  Filled 2019-05-29 (×2): qty 5

## 2019-05-29 MED ORDER — PANTOPRAZOLE SODIUM 40 MG PO PACK
40.0000 mg | PACK | Freq: Every day | ORAL | Status: DC
  Administered 2019-05-29 – 2019-05-30 (×2): 40 mg
  Filled 2019-05-29 (×3): qty 20

## 2019-05-29 MED ORDER — FENTANYL CITRATE (PF) 100 MCG/2ML IJ SOLN
INTRAMUSCULAR | Status: AC
  Administered 2019-05-29: 50 ug
  Filled 2019-05-29: qty 2

## 2019-05-29 MED ORDER — EPHEDRINE 5 MG/ML INJ
INTRAVENOUS | Status: AC
  Filled 2019-05-29: qty 10

## 2019-05-29 MED ORDER — VASOPRESSIN 20 UNIT/ML IV SOLN
INTRAVENOUS | Status: DC | PRN
  Administered 2019-05-29: 2 m[IU] via INTRAVENOUS

## 2019-05-29 MED ORDER — HYDROMORPHONE HCL 1 MG/ML IJ SOLN: 0.5000 mg | Freq: Once | INTRAMUSCULAR | Status: DC

## 2019-05-29 MED ORDER — PHENYLEPHRINE HCL (PRESSORS) 10 MG/ML IV SOLN
INTRAVENOUS | Status: DC | PRN
  Administered 2019-05-29: 120 ug via INTRAVENOUS
  Administered 2019-05-29: 80 ug via INTRAVENOUS

## 2019-05-29 MED ORDER — FLUMAZENIL 0.5 MG/5ML IV SOLN
INTRAVENOUS | Status: DC | PRN
  Administered 2019-05-29: .1 mg via INTRAVENOUS
  Administered 2019-05-29: .2 mg via INTRAVENOUS

## 2019-05-29 MED ORDER — PROPOFOL 10 MG/ML IV BOLUS
INTRAVENOUS | Status: AC
  Filled 2019-05-29: qty 20

## 2019-05-29 MED ORDER — ATORVASTATIN CALCIUM 40 MG PO TABS: 40.0000 mg | ORAL_TABLET | Freq: Every day | ORAL | Status: DC

## 2019-05-29 MED ORDER — FENTANYL CITRATE (PF) 250 MCG/5ML IJ SOLN
INTRAMUSCULAR | Status: AC
  Filled 2019-05-29: qty 5

## 2019-05-29 MED ORDER — NOREPINEPHRINE 4 MG/250ML-% IV SOLN
0.0000 ug/min | INTRAVENOUS | Status: DC
  Administered 2019-05-30: 5 ug/min via INTRAVENOUS
  Filled 2019-05-29 (×2): qty 250

## 2019-05-29 SURGICAL SUPPLY — 34 items
ADH SKN CLS APL DERMABOND .7 (GAUZE/BANDAGES/DRESSINGS)
AGENT HMST SPONGE THK3/8 (HEMOSTASIS)
ARMBAND PINK RESTRICT EXTREMIT (MISCELLANEOUS) ×1 IMPLANT
CANISTER SUCT 3000ML PPV (MISCELLANEOUS) ×1 IMPLANT
CLIP VESOCCLUDE MED 6/CT (CLIP) ×1 IMPLANT
CLIP VESOCCLUDE SM WIDE 6/CT (CLIP) ×1 IMPLANT
COVER PROBE W GEL 5X96 (DRAPES) IMPLANT
COVER WAND RF STERILE (DRAPES) ×1 IMPLANT
DECANTER SPIKE VIAL GLASS SM (MISCELLANEOUS) ×1 IMPLANT
DERMABOND ADVANCED (GAUZE/BANDAGES/DRESSINGS)
DERMABOND ADVANCED .7 DNX12 (GAUZE/BANDAGES/DRESSINGS) ×1 IMPLANT
ELECT REM PT RETURN 9FT ADLT (ELECTROSURGICAL)
ELECTRODE REM PT RTRN 9FT ADLT (ELECTROSURGICAL) ×1 IMPLANT
GLOVE BIO SURGEON STRL SZ7.5 (GLOVE) ×1 IMPLANT
GLOVE BIOGEL PI IND STRL 8 (GLOVE) ×1 IMPLANT
GLOVE BIOGEL PI INDICATOR 8 (GLOVE)
GOWN STRL REUS W/ TWL LRG LVL3 (GOWN DISPOSABLE) ×2 IMPLANT
GOWN STRL REUS W/ TWL XL LVL3 (GOWN DISPOSABLE) ×2 IMPLANT
GOWN STRL REUS W/TWL LRG LVL3 (GOWN DISPOSABLE)
GOWN STRL REUS W/TWL XL LVL3 (GOWN DISPOSABLE)
HEMOSTAT SPONGE AVITENE ULTRA (HEMOSTASIS) IMPLANT
KIT BASIN OR (CUSTOM PROCEDURE TRAY) ×1 IMPLANT
KIT TURNOVER KIT B (KITS) ×1 IMPLANT
NS IRRIG 1000ML POUR BTL (IV SOLUTION) ×1 IMPLANT
PACK CV ACCESS (CUSTOM PROCEDURE TRAY) ×1 IMPLANT
PAD ARMBOARD 7.5X6 YLW CONV (MISCELLANEOUS) ×2 IMPLANT
SUT MNCRL AB 4-0 PS2 18 (SUTURE) ×1 IMPLANT
SUT PROLENE 6 0 BV (SUTURE) IMPLANT
SUT PROLENE 7 0 BV 1 (SUTURE) ×1 IMPLANT
SUT VIC AB 3-0 SH 27 (SUTURE)
SUT VIC AB 3-0 SH 27X BRD (SUTURE) ×2 IMPLANT
TOWEL GREEN STERILE (TOWEL DISPOSABLE) ×1 IMPLANT
UNDERPAD 30X30 (UNDERPADS AND DIAPERS) ×1 IMPLANT
WATER STERILE IRR 1000ML POUR (IV SOLUTION) ×1 IMPLANT

## 2019-05-29 NOTE — Anesthesia Procedure Notes (Addendum)

## 2019-05-29 NOTE — H&P (Signed)
History and Physical Interval Note:  05/29/2019 9:42 AM  Michael [name].  has presented today for surgery, with the diagnosis of END STAGE RENAL DISEASE.  The various methods of treatment have been discussed with the patient and family. After consideration of risks, benefits and other options for treatment, the patient has consented to  Procedure(s): FISTULA SUPERFICIALIZATION VERSES ARTERIOVENOUS GRAFT (Right) as a surgical intervention.  The patient's history has been reviewed, patient examined, no change in status, stable for surgery.  I have reviewed the patient's chart and labs.  Questions were answered to the patient's satisfaction.    Plan right 2nd stage BVT vs right arm AVG.  Fistula was poorly maturing on follow-up after right 1st stage BVT and will re-evaluate in the OR with [place].   Marty Heck  Patient ID: Michael [name]., male DOB: [birth year], 64 y.o. [name]: [id number]  Reason for Consult: Chronic Kidney Disease (R Dialysis Duplex, S/p 1st stage AVF. )   Referred by [name], MD  Subjective:  Subjective Expand by Default  HPI:  Michael [name]. is a 64 y.o. male s/p Right 1st stage brachiobasilic vein transposition placed 03/07/2019, Dr. Carlis Abbott. He denies RUE/hand pain. No numbness or tingling. He dialyzes via right internal juglular TDC. He denies pain or problems with catheter. No fever or chills.      Past Medical History:  Diagnosis Date  . Atrial arrhythmia    atrial tachycardia with variable AV conduction versus atypical aflutter 01/10/19, rate control (02/06/19)  . Cataract   . Dyspnea    on exertion  . ESRD (end stage renal disease) ([place])    Hemo- MWF, Polycystic kidney disease  . Fatigue   . History of kidney stones    removal of stone- cysto  . Hyperlipidemia   . Hyperparathyroidism, secondary renal ([place])   . Hypertension   . Hypoxemia 12/12/2013  . Nonischemic cardiomyopathy ([place])    Er 25% 2015, 55 % 2013  . OSA on CPAP    no longer using cpap   . OSA on CPAP 03/24/2014  . Pneumonia    2015ish  . Ventricular tachycardia//Freq PVCs   . Wears glasses         Family History  Problem Relation Age of Onset  . Heart disease Mother   . Hyperlipidemia Mother   . Hypertension Mother   . Kidney disease Father   . Stroke Father   . Kidney disease Brother   . Amblyopia Neg Hx   . Blindness Neg Hx   . Cataracts Neg Hx   . Diabetes Neg Hx   . Glaucoma Neg Hx   . Macular degeneration Neg Hx   . Retinal detachment Neg Hx   . Strabismus Neg Hx   . Retinitis pigmentosa Neg Hx         Past Surgical History:  Procedure Laterality Date  . A-FLUTTER ABLATION N/A 04/11/2019   Procedure: A-FLUTTER ABLATION; Surgeon: Evans Lance, MD; Location: [place] [facility]; Service: Cardiovascular; Laterality: N/A;  . A/V FISTULAGRAM Left 04/27/2017   Procedure: A/V FISTULAGRAM; Surgeon: Conrad [place], MD; Location: [place] [facility]; Service: Cardiovascular; Laterality: Left; lt arm  . A/V FISTULAGRAM Left 01/10/2019   Procedure: A/V FISTULAGRAM; Surgeon: Marty Heck, MD; Location: [place] [facility]; Service: Cardiovascular; Laterality: Left;  . APPENDECTOMY    . AV FISTULA PLACEMENT  12/05/2011   Procedure: ARTERIOVENOUS (AV) FISTULA CREATION;LLEFT ARM Surgeon: Conrad [place], MD; Location: [place]; Service: Vascular;  Laterality: Left; RADIO-CEPHALIC fistula left arm  . AV FISTULA PLACEMENT  01/11/2012   Procedure: ARTERIOVENOUS (AV) FISTULA CREATION; Surgeon: Conrad [place], MD; Location: [place]; Service: Vascular; Laterality: Left; Creation of left brachial cephalic arteriovenous fistula  . AV FISTULA PLACEMENT Right 03/07/2019   Procedure: ARTERIOVENOUS (AV) FISTULA CREATION RIGHT ARM; Surgeon: Marty Heck, MD; Location: [place]; Service: Vascular; Laterality: Right;  . BASCILIC VEIN TRANSPOSITION Left 12/27/2016   Procedure: BASILIC VEIN TRANSPOSITION LEFT UPPER ARM FIRST STAGE; Surgeon: Conrad [place], MD; Location: [place]; Service:  Vascular; Laterality: Left;  . BASCILIC VEIN TRANSPOSITION Left 01/31/2017   Procedure: LEFT ARM BASILIC VEIN TRANSPOSITION, SECOND STAGE; Surgeon: Conrad [place], MD; Location: [place]; Service: Vascular; Laterality: Left;  . CARDIAC CATHETERIZATION  04-05-2010   checking for blockage but none-[facility]  . COLONOSCOPY    . CYSTOSCOPY W/ STONE MANIPULATION     "laser once" (01/22/2013)  . HEMATOMA EVACUATION Left 05/09/2017   Procedure: EVACUATION HEMATOMA LEFT ARM; Surgeon: Conrad [place], MD; Location: [place]; Service: Vascular; Laterality: Left;  . HERNIA REPAIR    . INGUINAL HERNIA REPAIR Right 11/06/2015   Procedure: OPEN HERNIA REPAIR RIGHT INGUINAL ADULT; Surgeon: [name], MD; [facility]; Service: General; Laterality: Right; with MESH  . INSERTION OF DIALYSIS CATHETER Right 10/05/2016   Procedure: INSERTION OF right internal jugular DIALYSIS CATHETER; Surgeon: [name], MD; Location: [place]; Service: Vascular; Laterality: Right;  . INSERTION OF MESH  03/20/2012   Procedure: INSERTION OF MESH; UMB Surgeon: [name], MD; Location: [place]; Service: General; Laterality: N/A;  . INSERTION OF MESH N/A 01/22/2013   Procedure: INSERTION OF MESH; Surgeon: [name], MD; Location: [place]; Service: General; Laterality: N/A;  . LAPAROTOMY  04/02/2012   Procedure: EXPLORATORY LAPAROTOMY; Surgeon: [name], MD; Location: [place]; Service: General; Laterality: N/A; Exploratory Laparotomy with resection of small intestine  . LEFT HEART CATHETERIZATION WITH CORONARY ANGIOGRAM N/A 05/14/2013   Procedure: LEFT HEART CATHETERIZATION WITH CORONARY ANGIOGRAM; Surgeon: Sinclair Grooms, MD; Location: [facility]; Service: Cardiovascular; Laterality: N/A;  . LIGATION OF ARTERIOVENOUS FISTULA Left 12/27/2016   Procedure: LIGATION/EXCISION OF LEFT UPPER ARM ARTERIOVENOUS FISTULA; EVACUATION OF HEMATOMA; Surgeon: Conrad , MD; Location: [place]; Service: Vascular; Laterality: Left;  .  LIGATION OF ARTERIOVENOUS FISTULA Left 03/07/2019   Procedure: LIGATION OF ARTERIOVENOUS FISTULA LEFT UPPER ARM; Surgeon: Marty Heck, MD; Location: [place]; Service: Vascular; Laterality: Left;  . REVISON OF ARTERIOVENOUS FISTULA Left 10/05/2016   Procedure: REVISON OF left arm ARTERIOVENOUS FISTULA; Surgeon: [name], MD; Location: [place]; Service: Vascular; Laterality: Left;  . TONSILLECTOMY    . UMBILICAL HERNIA REPAIR  03/20/2012   Procedure: HERNIA REPAIR UMBILICAL ADULT; Surgeon: [name], MD; Location: [place]; Service: General; Laterality: N/A;  . UMBILICAL HERNIA REPAIR  01/22/2013   preperitoneal open procedure due to significant adhesions/notes 01/22/2013  . VENTRAL HERNIA REPAIR N/A 01/22/2013   Procedure: ATTEMPTED LAPAROSCOPIC VENTRAL HERNIA CONVERTED TO OPEN; Surgeon: [name], MD; Location: [place]; Service: General; Laterality: N/A;   Short Social History:  Social History        Tobacco Use  . Smoking status: Never Smoker  . Smokeless tobacco: Never Used  Substance Use Topics  . Alcohol use: No    Alcohol/week: 0.0 standard drinks   No Known Allergies        Current Outpatient Medications  Medication Sig Dispense Refill  . acyclovir (ZOVIRAX) 400 MG tablet Take 400  mg by mouth every evening.     [name] aspirin EC 81 MG EC tablet Take 1 tablet (81 mg total) by mouth daily. (Patient taking differently: Take 81 mg by mouth every evening. )    . atorvastatin (LIPITOR) 40 MG tablet Take 40 mg by mouth every evening.     . dorzolamide-timolol (COSOPT) 22.3-6.8 MG/ML ophthalmic solution Place 1 drop into both eyes 2 (two) times daily. 10 mL 10  . HYDROcodone-acetaminophen (NORCO) 5-325 MG tablet Take 1 tablet by mouth every 6 (six) hours as needed for moderate pain. 20 tablet 0  . nitroGLYCERIN (NITROSTAT) 0.4 MG SL tablet Place 1 tablet (0.4 mg total) under the tongue every 5 (five) minutes x 3 doses as needed for chest pain. 30 tablet 12  . prednisoLONE  acetate (PRED FORTE) 1 % ophthalmic suspension Place 1 drop into the right eye daily at 12 noon.     . sevelamer carbonate (RENVELA) 800 MG tablet Take 1,600 mg by mouth 3 (three) times daily with meals.     No current facility-administered medications for this visit.   REVIEW OF SYSTEMS  Objective:  Objective     Vitals:   04/23/19 1254  BP: 122/90  Pulse: 80  Resp: 16  Temp: 98.3 F (36.8 C)  TempSrc: Oral  SpO2: 100%  Weight: 182 lb (82.6 kg)  Height: 5\' 3"  (1.6 m)   Body mass index is 32.24 kg/m.  Physical Exam:  WD, WN male in NAD  A and O x 4  RRR  RUE: No edema. Incision well healed. Good thrill and bruit in fistula. Fistula is not easily palpated due to size and depth. Normal grip strength. 2+ radial pulse  Summary:  Patent fistula with no visualized stenosis. Immature nature of vein after 6 weeks  Data:  Findings:  +--------------------+----------+-----------------+--------+  AVF PSV (cm/s)Flow Vol (mL/min)Comments  +--------------------+----------+-----------------+--------+  Native artery inflow 130  1138    +--------------------+----------+-----------------+--------+  AVF Anastomosis  589     +--------------------+----------+-----------------+--------+  +------------+----------+-------------+----------+--------+  OUTFLOW VEINPSV (cm/s)Diameter (cm)Depth (cm)Describe  +------------+----------+-------------+----------+--------+  Mid UA  224  0.43  0.47    +------------+----------+-------------+----------+--------+  Dist UA  265  0.36  0.50    +------------+----------+-------------+----------+--------+  AC Fossa  585  0.27  0.57    +------------+----------+-------------+----------+--------+  Assessment/Plan:  Assessment  Immature stage brachiobasilic A-V fistula. We discussed ultrasound and PE findings. REC: possible superficialization versus placement of AV graft with Dr. Carlis Abbott. The vein has not matured well and the  size was small to start with. We discussed risks and rationale of the procedure and the possibility of graft failure.  Risa Grill, PA-C  [facility]  clinic MD: Dr. Donnetta Hutching

## 2019-05-29 NOTE — OR Nursing (Signed)
After patient prepped and draped, patient went into respiratory arrest. CODE called. Procedure cancelled.   Linwood Dibbles, RN

## 2019-05-29 NOTE — Procedures (Signed)
Central Venous Catheter Insertion Procedure Note Michael [name] [id number] 1956-01-26  Procedure: Insertion of Central Venous Catheter Indications: Drug and/or fluid administration  Procedure Details Consent: Unable to obtain consent because of altered level of consciousness. Time Out: Verified patient identification, verified procedure, site/side was marked, verified correct patient position, special equipment/implants available, medications/allergies/relevent history reviewed, required imaging and test results available.  Performed  Maximum sterile technique was used including antiseptics, cap, gloves, gown, hand hygiene, mask and sheet. Skin prep: Chlorhexidine; local anesthetic administered A antimicrobial bonded/coated triple lumen catheter was placed in the left subclavian vein using the Seldinger technique.  Evaluation Blood flow good Complications: No apparent complications Patient did tolerate procedure well. Chest X-ray ordered to verify placement.  CXR: normal.  Sholom Dulude 05/29/2019, 3:32 PM

## 2019-05-29 NOTE — Progress Notes (Signed)
Per RN, pt is about to go to CT which will take at least 45 minutes. Will check back for EEG/LTM hookup as schedule permits.

## 2019-05-29 NOTE — Progress Notes (Signed)
eLink Physician-Brief Progress Note Patient Name: Michael [name]. DOB: 04/02/1956 [name]: [id number]   Date of Service  05/29/2019  HPI/Events of Note  Troponin continues to trend up, mild ST elevation noted on bedside monitor   eICU Interventions  Will ask cardiology to see  Repeat 12 lead EKG now     Intervention Category Intermediate Interventions: Communication with other healthcare providers and/or family;Arrhythmia - evaluation and management  Darlina Sicilian 05/29/2019, 8:58 PM

## 2019-05-29 NOTE — Progress Notes (Signed)
vLTM EEG started following spot EEG. Notified Neuro. Pt being monitored thru [facility]

## 2019-05-29 NOTE — Progress Notes (Signed)
EEG completed, results pending. 

## 2019-05-29 NOTE — Progress Notes (Signed)
CRITICAL VALUE ALERT  Critical Value: Lactic acid 3.3  Date & Time Notied:  05/29/19 1720  Provider Notified: Lynetta Mare MD   Orders Received/Actions taken: see new orders- Repeat lactic acid in 3h (8pm)

## 2019-05-29 NOTE — Progress Notes (Signed)
64 year old male scheduled for right arm AV fistula versus graft today.  Ultimately prior to incision he had a cardiac arrest in the operating room.  Initially MAC anesthesia and then the patient became hypoxic and while anesthesia was converting to LMA patient became hypotensive and could not feel a pulse.  Ultimately got 4 minutes of CPR and 2 mg of epi until ROSC.  He is now intubated and will be going to the recovery room in critical condition.  I have called CCM and asked for their assistance for admission.  We will send full set of labs including EKG and cardiac enzymes to rule out cardiac event but certainly appears to be a possible respiratory arrest given hypoxia in OR.  Surgery is canceled and no incision was made.  Marty Heck, MD [facility] Office: [place]

## 2019-05-29 NOTE — Progress Notes (Signed)
eLink Physician-Brief Progress Note Patient Name: Michael [name]. DOB: February 14, 1956 [name]: [id number]   Date of Service  05/29/2019  HPI/Events of Note  Pt with transient bradycardia 40-50's, HR now 59, BP 119/73  eICU Interventions  Continue to observe,Place pacing pads if drop recurs, no change to medications at this time.        [name] [name] 05/29/2019, 11:47 PM

## 2019-05-29 NOTE — Progress Notes (Signed)
NAME:  Michael [name]., [name]:  [id number], DOB:  31-Jan-1956, LOS: 0 ADMISSION DATE:  05/29/2019, CONSULTATION DATE:  05/28/2018 REFERRING MD:  Wilmer Floor - Vascular surgery, CHIEF COMPLAINT:  Cardiac arrest   Brief History   64 year old man who suffered a pre-operative cardiac arrest.  History of present illness   Admitted today for revision of AV fistula right arm. Patient seen in holding this morning with no complaints and passed pre-operative anesthesia clearance yesterday.  Had received pre-medication of midazolam and fentanyl some time before event and has been talking to CRNA throughout. Plan was to do case with regional anesthesia, but patient had not yet received and local anesthetic. Suddenly unresponsive with loss of pulse. Intubated by CRNA.  CPR for 4 minutes prior to ROSC.  Monitor failed in OR so initial rhythm unknown. Impression from surgeon and anesthesiologist was that arrest was primarily respiratory.    Past Medical History   Past Medical History:  Diagnosis Date  . Atrial arrhythmia    atrial tachycardia with variable AV conduction versus atypical aflutter 01/10/19, rate control (02/06/19)  . Cataract   . Dyspnea    on exertion  . ESRD (end stage renal disease) ([place])    Hemo- MWF, Polycystic kidney disease  . Fatigue   . History of kidney stones    removal of stone- cysto  . Hyperlipidemia   . Hyperparathyroidism, secondary renal ([place])   . Hypertension   . Hypoxemia 12/12/2013  . Nonischemic cardiomyopathy ([place])    Er 25% 2015, 55 % 2013  . OSA on CPAP    no longer using cpap  . OSA on CPAP 03/24/2014  . Pneumonia    2015ish  . Ventricular tachycardia//Freq PVCs   . Wears glasses      Significant Hospital Events   Cardiac arrest in OR 1/27  Consults:  PCCM  Procedures:  Aborted right fistula revision with   Significant Diagnostic Tests:  ECG 1/27 post arrest: NSR with LAD, non-specific IVCD with possible LVH by voltage criteria. No change from  prior.  Micro Data:  N/A  Antimicrobials:  N/A   Interim history/subjective:  Patient admitted to ICU intubated and unresponsive.  Objective   Blood pressure 135/86, pulse 100, temperature 97.7 F (36.5 C), resp. rate 20, height 5\' 6"  (1.676 m), weight 87.1 kg, SpO2 100 %.       No intake or output data in the 24 hours ending 05/29/19 1240 Filed Weights   05/29/19 0734  Weight: 87.1 kg    Examination: General: appears stated age and of average build HENT: ETT in place, no tongue bitting. Lungs: chest clear. Mechanically ventilated. Cardiovascular: Extremities warm. HS distant but no murmurs.  Abdomen: minimal OG aspirate, protuberant but soft Extremities: moderate leg edema L>R Neuro: cough with extensor posturing with stimulation from ETT. No seizure activity. Eyes open but no tracking.  GU: Anuric.  I performed a bedside echocardiogram which showed a dilated LV with thin walls as severe global hypokinesis with EF measured and visually estimated at 20%. No MR, trivial AI,PI, moderate TR with RVSP 50 mmHg, Elevated LV filling pressures, RV moderately enlarged with volume overload. IVC dilated with minimal respiratory variation consistent with CVP of 15.   Resolved Hospital Problem list   Cardiac arrest.  Assessment & Plan:   Critically ill due to cardiac arrest of unclear etiology. Likely respiratory due to sedation for OR, with rapid decline due to compromised RV function.  - TTM to 36 -  CT head to rule out primary neurological insult. - cEEG to rule out seizures.  Critically ill due to acute hypoxic respiratory failure requiring mechanical ventilation. Recovery from arrest correlated with intubation - likely due to improved RV function from correction of hypoxia and hypercarbia. - Continue full ventilatory support during TTM -Wean and extubate thereafter as mental status allows.  NICM with biventricular dysfunction.  Hold home HF therapy.  ESRD stage 5 on HD.    -Nephrology consultation for CRRT via HD catheter. Would aim for -1.5L/24h.    Daily Goals Checklist  Pain/Anxiety/Delirium protocol (if indicated): dilaudid and propofol infusions. VAP protocol (if indicated): bundle in place. Respiratory support goals: Full support no weaning during TTM Blood pressure target: MAP>65. Start NE if necessary.  DVT prophylaxis: heparin tid Nutrition Status: moderate risk, start tube feeds.  GI prophylaxis: protonix via tube Fluid status goals:  CRRT UF -72ml/h Urinary catheter: patient anuric, bladder scan  Central line: left [place] TLC Glucose control: no DM - phase 1 Pioneer insulin scale Mobility/therapy needs: bedrest. Antibiotic de-escalation: none Home medication reconciliation: reviewed and relevant meds ordered. Daily labs: CMP, CBC daily Code Status: Full code Family Communication: updated by Dr Carlis Abbott. Disposition: ICU  Labs   CBC: Recent Labs  Lab 05/29/19 0817  HGB 15.6  HCT 10.2    Basic Metabolic Panel: Recent Labs  Lab 05/29/19 0817  NA 137  K 4.0  CL 101  GLUCOSE 81  BUN 34*  CREATININE 10.50*   GFR: Estimated Creatinine Clearance: 7.6 mL/min (A) (by C-G formula based on SCr of 10.5 mg/dL (H)). No results for input(s): PROCALCITON, WBC, LATICACIDVEN in the last 168 hours.  Liver Function Tests: No results for input(s): AST, ALT, ALKPHOS, BILITOT, PROT, ALBUMIN in the last 168 hours. No results for input(s): LIPASE, AMYLASE in the last 168 hours. No results for input(s): AMMONIA in the last 168 hours.  ABG    Component Value Date/Time   TCO2 25 05/29/2019 0817     Coagulation Profile: No results for input(s): INR, PROTIME in the last 168 hours.  Cardiac Enzymes: No results for input(s): CKTOTAL, CKMB, CKMBINDEX, TROPONINI in the last 168 hours.  HbA1C: No results found for: HGBA1C  CBG: No results for input(s): GLUCAP in the last 168 hours.    CRITICAL CARE Performed by: Kipp Brood   Total critical  care time: 60 minutes  Critical care time was exclusive of separately billable procedures and treating other patients.  Critical care was necessary to treat or prevent imminent or life-threatening deterioration.  Critical care was time spent personally by me on the following activities: development of treatment plan with patient and/or surrogate as well as nursing, discussions with consultants, evaluation of patient's response to treatment, examination of patient, obtaining history from patient or surrogate, ordering and performing treatments and interventions, ordering and review of laboratory studies, ordering and review of radiographic studies, pulse oximetry, re-evaluation of patient's condition and participation in multidisciplinary rounds.  Kipp Brood, MD [facility] ICU Physician [place]  Pager: [phone] Mobile: [phone] After hours: [phone].

## 2019-05-29 NOTE — Procedures (Signed)
Patient Name: Michael [name].  [name]: [id number]  Epilepsy Attending: Lora Havens  Referring Physician/Provider: Dr Kipp Brood Date: 05/29/2019 Duration: 24.15 mins  Patient history: 64yo M s/p cardiac arrest on TTM. EEG to evaluate for seizure  Level of alertness: comatose  AEDs during EEG study: propofol  Technical aspects: This EEG study was done with scalp electrodes positioned according to the 10-20 International system of electrode placement. Electrical activity was acquired at a sampling rate of 500Hz  and reviewed with a high frequency filter of 70Hz  and a low frequency filter of 1Hz . EEG data were recorded continuously and digitally stored.   DESCRIPTON: EEG showed continuous generalized background suppression. EEG was not reactive to tactile stimuli. Hyperventilation and photic stimulation were not performed.  ABNORMALITY - Background suppression, generalized   IMPRESSION: This study is suggestive of profound diffuse encephalopathy, non specific to etiology. No seizures or epileptiform discharges were seen throughout the recording.  Britt Theard Barbra Sarks

## 2019-05-29 NOTE — Progress Notes (Signed)
CRITICAL VALUE ALERT  Critical Value:  Troponin high sensitivity 658  Date & Time Notied:  6916 05/29/19  Provider Notified: Lynetta Mare MD   Orders Received/Actions taken: no new orders at this time

## 2019-05-29 NOTE — Consult Note (Signed)
[facility] Renal Consultation Note  Requesting MD: Kipp Brood, MD Indication for Consultation:  ESRD  Chief complaint: arrest pre-op  HPI:  Michael [name]. is a 64 y.o. male with a history of end-stage renal disease on hemodialysis Monday Wednesday Friday who presented to the hospital for fistula vs graft with Dr. Carlis Abbott.  Pre-operatively after anesthesia he became unresponsive and arrested and received 4 minutes of CPR  prior to [place]. He was intubated and arrest felt to be respiratory in etiology at this time.  Spoke with critical care. They are ok with either CRRT or intermittent HD.  He got to his EDW on 1/25 after 5 kg UF for his last treatment.  Spoke with his wife at bedside - she does want for him to continue with renal replacement therapy.    Creatinine, Ser  Date/Time Value Ref Range Status  05/29/2019 04:28 PM 10.79 (H) 0.61 - 1.24 mg/dL Final  05/29/2019 11:46 AM 10.00 (H) 0.61 - 1.24 mg/dL Final  05/29/2019 08:17 AM 10.50 (H) 0.61 - 1.24 mg/dL Final  03/26/2019 04:50 PM 4.73 (H) 0.76 - 1.27 mg/dL Final  03/07/2019 02:59 PM 6.20 (H) 0.61 - 1.24 mg/dL Final  03/07/2019 10:19 AM 6.50 (H) 0.61 - 1.24 mg/dL Final  01/10/2019 06:33 AM 6.10 (H) 0.61 - 1.24 mg/dL Final  04/27/2017 08:26 AM 5.80 (H) 0.61 - 1.24 mg/dL Final  11/06/2015 06:44 AM 7.05 (H) 0.61 - 1.24 mg/dL Final  05/18/2013 07:15 AM 5.41 (H) 0.50 - 1.35 mg/dL Final  05/14/2013 01:45 AM 3.83 (H) 0.50 - 1.35 mg/dL Final  05/13/2013 12:26 PM 2.90 (H) 0.50 - 1.35 mg/dL Final  01/25/2013 05:00 AM 6.58 (H) 0.50 - 1.35 mg/dL Final  01/23/2013 09:15 AM 7.13 (H) 0.50 - 1.35 mg/dL Final  01/23/2013 07:10 AM 6.94 (H) 0.50 - 1.35 mg/dL Final  01/22/2013 04:12 PM 6.05 (H) 0.50 - 1.35 mg/dL Final  01/15/2013 10:25 AM 5.46 (H) 0.50 - 1.35 mg/dL Final  04/09/2012 05:40 AM 3.63 (H) 0.50 - 1.35 mg/dL Final  04/08/2012 07:24 AM 3.91 (H) 0.50 - 1.35 mg/dL Final  04/06/2012 06:10 AM 4.65 (H) 0.50 - 1.35 mg/dL Final   04/05/2012 05:47 AM 5.24 (H) 0.50 - 1.35 mg/dL Final  04/04/2012 05:30 AM 5.91 (H) 0.50 - 1.35 mg/dL Final  04/03/2012 03:05 PM 6.09 (H) 0.50 - 1.35 mg/dL Final  04/03/2012 05:45 AM 5.85 (H) 0.50 - 1.35 mg/dL Final  04/02/2012 07:37 AM 5.34 (H) 0.50 - 1.35 mg/dL Final  04/01/2012 10:36 AM 5.54 (H) 0.50 - 1.35 mg/dL Final  08/16/2008 06:05 AM 2.46 (H) 0.4 - 1.5 mg/dL Final  08/15/2008 08:37 AM 2.9 (H) 0.4 - 1.5 mg/dL Final     PMHx:   Past Medical History:  Diagnosis Date  . Atrial arrhythmia    atrial tachycardia with variable AV conduction versus atypical aflutter 01/10/19, rate control (02/06/19)  . Cataract   . Dyspnea    on exertion  . ESRD (end stage renal disease) ([place])    Hemo- MWF, Polycystic kidney disease  . Fatigue   . History of kidney stones    removal of stone- cysto  . Hyperlipidemia   . Hyperparathyroidism, secondary renal ([place])   . Hypertension   . Hypoxemia 12/12/2013  . Nonischemic cardiomyopathy ([place])    Er 25% 2015, 55 % 2013  . OSA on CPAP    no longer using cpap  . OSA on CPAP 03/24/2014  . Pneumonia    2015ish  . Ventricular tachycardia//Freq PVCs   .  Wears glasses     Past Surgical History:  Procedure Laterality Date  . A-FLUTTER ABLATION N/A 04/11/2019   Procedure: A-FLUTTER ABLATION;  Surgeon: Evans Lance, MD;  Location: [place] [facility];  Service: Cardiovascular;  Laterality: N/A;  . A/V FISTULAGRAM Left 04/27/2017   Procedure: A/V FISTULAGRAM;  Surgeon: Conrad Rayland, MD;  Location: [place] [facility];  Service: Cardiovascular;  Laterality: Left;  lt arm  . A/V FISTULAGRAM Left 01/10/2019   Procedure: A/V FISTULAGRAM;  Surgeon: Marty Heck, MD;  Location: [place] [facility];  Service: Cardiovascular;  Laterality: Left;  . APPENDECTOMY    . AV FISTULA PLACEMENT  12/05/2011   Procedure: ARTERIOVENOUS (AV) FISTULA CREATION;LLEFT ARM  Surgeon: Conrad Parkwood, MD;  Location: [name];  Service: Vascular;  Laterality: Left;   RADIO-CEPHALIC  fistula left arm  . AV FISTULA PLACEMENT  01/11/2012   Procedure: ARTERIOVENOUS (AV) FISTULA CREATION;  Surgeon: Conrad [place], MD;  Location: [place];  Service: Vascular;  Laterality: Left;  Creation of left brachial cephalic arteriovenous fistula  . AV FISTULA PLACEMENT Right 03/07/2019   Procedure: ARTERIOVENOUS (AV) FISTULA CREATION  RIGHT ARM;  Surgeon: Marty Heck, MD;  Location: [place];  Service: Vascular;  Laterality: Right;  . BASCILIC VEIN TRANSPOSITION Left 12/27/2016   Procedure: BASILIC VEIN TRANSPOSITION LEFT UPPER ARM FIRST STAGE;  Surgeon: Conrad [place]ville, MD;  Location: [place];  Service: Vascular;  Laterality: Left;  . BASCILIC VEIN TRANSPOSITION Left 01/31/2017   Procedure: LEFT ARM BASILIC VEIN TRANSPOSITION, SECOND STAGE;  Surgeon: Conrad [place], MD;  Location: [place];  Service: Vascular;  Laterality: Left;  . CARDIAC CATHETERIZATION  04-05-2010   checking for blockage but none-[facility]  . COLONOSCOPY    . CYSTOSCOPY W/ STONE MANIPULATION     "laser once" (01/22/2013)  . HEMATOMA EVACUATION Left 05/09/2017   Procedure: EVACUATION HEMATOMA LEFT ARM;  Surgeon: Conrad [place], MD;  Location: [place];  Service: Vascular;  Laterality: Left;  . HERNIA REPAIR    . INGUINAL HERNIA REPAIR Right 11/06/2015   Procedure: OPEN HERNIA REPAIR  RIGHT INGUINAL ADULT;  Surgeon: [name], MD;  Location: [facility];  Service: General;  Laterality: Right;  with MESH  . INSERTION OF DIALYSIS CATHETER Right 10/05/2016   Procedure: INSERTION OF right internal jugular DIALYSIS CATHETER;  Surgeon: [name], MD;  Location: [place];  Service: Vascular;  Laterality: Right;  . INSERTION OF MESH  03/20/2012   Procedure: INSERTION OF MESH;  UMB Surgeon: [name], MD;  Location: [place];  Service: General;  Laterality: N/A;  . INSERTION OF MESH N/A 01/22/2013   Procedure: INSERTION OF MESH;  Surgeon: [name], MD;  Location: [place];  Service: General;  Laterality: N/A;  . LAPAROTOMY   04/02/2012   Procedure: EXPLORATORY LAPAROTOMY;  Surgeon: [name], MD;  Location: [place];  Service: General;  Laterality: N/A;  Exploratory Laparotomy with resection of small intestine  . LEFT HEART CATHETERIZATION WITH CORONARY ANGIOGRAM N/A 05/14/2013   Procedure: LEFT HEART CATHETERIZATION WITH CORONARY ANGIOGRAM;  Surgeon: Sinclair Grooms, MD;  Location[facility] C[facility] LAB;  Service: Cardiovascular;  Laterality: N/A;  . LIGATION OF ARTERIOVENOUS  FISTULA Left 12/27/2016   Procedure: LIGATION/EXCISION OF LEFT UPPER ARM ARTERIOVENOUS  FISTULA;  EVACUATION OF HEMATOMA;  Surgeon: Conrad Victor, MD;  Location: [place];  Service: Vascular;  Laterality: Left;  . LIGATION OF ARTERIOVENOUS  FISTULA Left 03/07/2019   Procedure: LIGATION  OF ARTERIOVENOUS FISTULA  LEFT UPPER ARM;  Surgeon: Marty Heck, MD;  Location: [place];  Service: Vascular;  Laterality: Left;  . REVISON OF ARTERIOVENOUS FISTULA Left 10/05/2016   Procedure: REVISON OF left arm ARTERIOVENOUS FISTULA;  Surgeon: [name], MD;  Location: [place];  Service: Vascular;  Laterality: Left;  . TONSILLECTOMY    . UMBILICAL HERNIA REPAIR  03/20/2012   Procedure: HERNIA REPAIR UMBILICAL ADULT;  Surgeon: [name], MD;  Location: [place];  Service: General;  Laterality: N/A;  . UMBILICAL HERNIA REPAIR  01/22/2013   preperitoneal open procedure due to significant adhesions/notes 01/22/2013  . VENTRAL HERNIA REPAIR N/A 01/22/2013   Procedure: ATTEMPTED LAPAROSCOPIC VENTRAL HERNIA CONVERTED TO OPEN;  Surgeon: [name], MD;  Location: [facility];  Service: General;  Laterality: N/A;    Family Hx:  Family History  Problem Relation Age of Onset  . Heart disease Mother   . Hyperlipidemia Mother   . Hypertension Mother   . Kidney disease Father   . Stroke Father   . Kidney disease Brother   . Amblyopia Neg Hx   . Blindness Neg Hx   . Cataracts Neg Hx   . Diabetes Neg Hx   . Glaucoma Neg Hx   . Macular degeneration Neg Hx   .  Retinal detachment Neg Hx   . Strabismus Neg Hx   . Retinitis pigmentosa Neg Hx     Social History:  reports that he has never smoked. He has never used smokeless tobacco. He reports that he does not drink alcohol or use drugs.  Allergies: No Known Allergies  Medications: Prior to Admission medications   Medication Sig Start Date End Date Taking? Authorizing Provider  acyclovir (ZOVIRAX) 400 MG tablet Take 400 mg by mouth daily with lunch.  11/16/17  Yes [provider]  aspirin EC 81 MG EC tablet Take 1 tablet (81 mg total) by mouth daily. 05/19/13  Yes Lendon Colonel, NP  atorvastatin (LIPITOR) 40 MG tablet Take 40 mg by mouth at bedtime.    Yes [provider]  dorzolamide-timolol (COSOPT) 22.3-6.8 MG/ML ophthalmic solution Place 1 drop into both eyes 2 (two) times daily. 03/12/19  Yes [name], MD  nitroGLYCERIN (NITROSTAT) 0.4 MG SL tablet Place 1 tablet (0.4 mg total) under the tongue every 5 (five) minutes x 3 doses as needed for chest pain. 05/19/13  Yes Lendon Colonel, NP  prednisoLONE acetate (PRED FORTE) 1 % ophthalmic suspension Place 1 drop into the right eye daily at 12 noon.    Yes [provider]  sevelamer carbonate (RENVELA) 800 MG tablet Take 1,600 mg by mouth See admin instructions. Take 1600 mg with each meal   Yes [provider]  HYDROcodone-acetaminophen (NORCO) 5-325 MG tablet Take 1 tablet by mouth every 6 (six) hours as needed for moderate pain. Patient not taking: Reported on 05/22/2019 03/07/19   Dagoberto Ligas, PA-C    I have reviewed the patient's current medications.  Labs:  BMP Latest Ref Rng & Units 05/29/2019 05/29/2019 05/29/2019  Glucose 70 - 99 mg/dL 75 - 156(H)  BUN 8 - 23 mg/dL 39(H) - 35(H)  Creatinine 0.61 - 1.24 mg/dL 10.79(H) - 10.00(H)  BUN/Creat Ratio 10 - 24 - - -  Sodium 135 - 145 mmol/L 142 136 141  Potassium 3.5 - 5.1 mmol/L 5.2(H) 4.5 5.4(H)  Chloride 98 - 111 mmol/L 99 - 102  CO2 22 - 32  mmol/L 25 - 22  Calcium  8.9 - 10.3 mg/dL 9.0 - 8.8(L)    Urinalysis    Component Value Date/Time   COLORURINE YELLOW 04/01/2012 1311   APPEARANCEUR CLOUDY (A) 04/01/2012 1311   LABSPEC 1.016 04/01/2012 1311   PHURINE 5.0 04/01/2012 1311   GLUCOSEU NEGATIVE 04/01/2012 1311   HGBUR NEGATIVE 04/01/2012 1311   BILIRUBINUR SMALL (A) 04/01/2012 1311   KETONESUR 15 (A) 04/01/2012 1311   PROTEINUR 100 (A) 04/01/2012 1311   UROBILINOGEN 0.2 04/01/2012 1311   NITRITE NEGATIVE 04/01/2012 1311   LEUKOCYTESUR TRACE (A) 04/01/2012 1311     ROS: Unable to obtain secondary to intubated   Physical Exam: Vitals:   05/29/19 1400 05/29/19 1600  BP: (!) 129/110 (!) 105/51  Pulse: 91   Resp: (!) 21   Temp:  97.7 F (36.5 C)  SpO2: (!) 89%      General: adult male critically ill on vent  HEENT: NCAT ET tube in place Eyes: eyes closed  Neck: trachea midline  Heart: S1S2 no rub  113/79 blood pressure on my exam  Lungs: coarse mechanical breath sounds  Abdomen: softly distended/obese habitus dec bowel sounds Extremities: 1-2+ edema bilateral lower extremities Skin: no rash on extremities exposed Neuro: sedation running Access: RIJ tunneled catheter   northwest 400 BF/800 DF 4.5 hours  EDW 79 kg  2 K/2 calcium Tunneled catheter  No mircera active - last Hb 13.9 on 1/22 parsabiv 2.5 mg 3 x/week hectorol 2 mcg IV each tx  Assessment/Plan:  # Cardiac arrest  - supportive care per critical care   - patient is on hypothermia protocol   # Acute hypoxic respiratory failure  - on vent per critical care    - optimize volume status   # Nonischemic cardiomyopathy  - optimize volume with dialysis    # ESRD - Normally MWF schedule for HD  - Will perform HD today with gentle UF   # Secondary hyperpara  - Can resume hectorol   # HTN  - normotensive currently not requiring pressors    Claudia Desanctis 05/29/2019, 5:37 PM

## 2019-05-29 NOTE — Progress Notes (Signed)
Hypoglycemic Event  CBG: 63  Treatment: 12.5g Dextrose 50%  Symptoms: N/A- patient vented/sedated   Follow-up CBG: Time:1704 CBG Result:134  Possible Reasons for Event: NPO  Comments/MD notified: Agarwala    Alma Friendly

## 2019-05-30 ENCOUNTER — Inpatient Hospital Stay (HOSPITAL_COMMUNITY): Payer: Medicare Other

## 2019-05-30 LAB — BASIC METABOLIC PANEL
Anion gap: 16 — ABNORMAL HIGH (ref 5–15)
Anion gap: 14 (ref 5–15)
Anion gap: 13 (ref 5–15)
BUN: 43 mg/dL — ABNORMAL HIGH (ref 8–23)
BUN: 28 mg/dL — ABNORMAL HIGH (ref 8–23)
BUN: 32 mg/dL — ABNORMAL HIGH (ref 8–23)
CO2: 24 mmol/L (ref 22–32)
CO2: 25 mmol/L (ref 22–32)
CO2: 25 mmol/L (ref 22–32)
Calcium: 8.9 mg/dL (ref 8.9–10.3)
Calcium: 8.8 mg/dL — ABNORMAL LOW (ref 8.9–10.3)
Calcium: 8.6 mg/dL — ABNORMAL LOW (ref 8.9–10.3)
Chloride: 99 mmol/L (ref 98–111)
Chloride: 100 mmol/L (ref 98–111)
Chloride: 99 mmol/L (ref 98–111)
Creatinine, Ser: 10.18 mg/dL — ABNORMAL HIGH (ref 0.61–1.24)
Creatinine, Ser: 7.54 mg/dL — ABNORMAL HIGH (ref 0.61–1.24)
Creatinine, Ser: 8.89 mg/dL — ABNORMAL HIGH (ref 0.61–1.24)
GFR calc Af Amer: 6 mL/min — ABNORMAL LOW (ref >60)
GFR calc Af Amer: 8 mL/min — ABNORMAL LOW (ref >60)
GFR calc Af Amer: 7 mL/min — ABNORMAL LOW (ref >60)
GFR calc non Af Amer: 5 mL/min — ABNORMAL LOW (ref >60)
GFR calc non Af Amer: 7 mL/min — ABNORMAL LOW (ref >60)
GFR calc non Af Amer: 6 mL/min — ABNORMAL LOW (ref >60)
Glucose, Bld: 96 mg/dL (ref 70–99)
Glucose, Bld: 64 mg/dL — ABNORMAL LOW (ref 70–99)
Glucose, Bld: 77 mg/dL (ref 70–99)
Potassium: 5.4 mmol/L — ABNORMAL HIGH (ref 3.5–5.1)
Potassium: 4.5 mmol/L (ref 3.5–5.1)
Potassium: 4.3 mmol/L (ref 3.5–5.1)
Sodium: 139 mmol/L (ref 135–145)
Sodium: 139 mmol/L (ref 135–145)
Sodium: 137 mmol/L (ref 135–145)

## 2019-05-30 LAB — GLUCOSE, CAPILLARY
Glucose-Capillary: 102 mg/dL — ABNORMAL HIGH (ref 70–99)
Glucose-Capillary: 130 mg/dL — ABNORMAL HIGH (ref 70–99)
Glucose-Capillary: 60 mg/dL — ABNORMAL LOW (ref 70–99)
Glucose-Capillary: 61 mg/dL — ABNORMAL LOW (ref 70–99)
Glucose-Capillary: 67 mg/dL — ABNORMAL LOW (ref 70–99)
Glucose-Capillary: 73 mg/dL (ref 70–99)
Glucose-Capillary: 79 mg/dL (ref 70–99)
Glucose-Capillary: 82 mg/dL (ref 70–99)
Glucose-Capillary: 83 mg/dL (ref 70–99)
Glucose-Capillary: 86 mg/dL (ref 70–99)

## 2019-05-30 LAB — BLOOD GAS, ARTERIAL
Acid-Base Excess: 0.3 mmol/L (ref 0.0–2.0)
Bicarbonate: 24.4 mmol/L (ref 20.0–28.0)
Drawn by: 313941
FIO2: 50
O2 Saturation: 99.4 %
Patient temperature: 36.2
pCO2 arterial: 38.1 mmHg (ref 32.0–48.0)
pH, Arterial: 7.418 (ref 7.350–7.450)
pO2, Arterial: 194 mmHg — ABNORMAL HIGH (ref 83.0–108.0)

## 2019-05-30 LAB — CBC
HCT: 46 % (ref 39.0–52.0)
Hemoglobin: 14.2 g/dL (ref 13.0–17.0)
MCH: 25.5 pg — ABNORMAL LOW (ref 26.0–34.0)
MCHC: 30.9 g/dL (ref 30.0–36.0)
MCV: 82.7 fL (ref 80.0–100.0)
Platelets: 163 10*3/uL (ref 150–400)
RBC: 5.56 MIL/uL (ref 4.22–5.81)
RDW: 19.7 % — ABNORMAL HIGH (ref 11.5–15.5)
WBC: 4.6 10*3/uL (ref 4.0–10.5)
nRBC: 0 % (ref 0.0–0.2)

## 2019-05-30 LAB — PHOSPHORUS: Phosphorus: 5.8 mg/dL — ABNORMAL HIGH (ref 2.5–4.6)

## 2019-05-30 LAB — MAGNESIUM: Magnesium: 2.2 mg/dL (ref 1.7–2.4)

## 2019-05-30 LAB — HEPATITIS B SURFACE ANTIGEN: Hepatitis B Surface Ag: NONREACTIVE

## 2019-05-30 LAB — PROTIME-INR
INR: 1.3 — ABNORMAL HIGH (ref 0.8–1.2)
Prothrombin Time: 16.2 seconds — ABNORMAL HIGH (ref 11.4–15.2)

## 2019-05-30 LAB — TRIGLYCERIDES: Triglycerides: 62 mg/dL (ref <150)

## 2019-05-30 LAB — APTT: aPTT: 50 seconds — ABNORMAL HIGH (ref 24–36)

## 2019-05-30 LAB — TROPONIN I (HIGH SENSITIVITY): Troponin I (High Sensitivity): 1582 ng/L (ref <18)

## 2019-05-30 IMAGING — DX DG CHEST 1V PORT
1 series · 1 of 1 positions shown · non-contrast
Comparison: Radiograph [DATE]

CLINICAL DATA: Intubation

EXAM:
PORTABLE CHEST 1 VIEW

[chest ap]
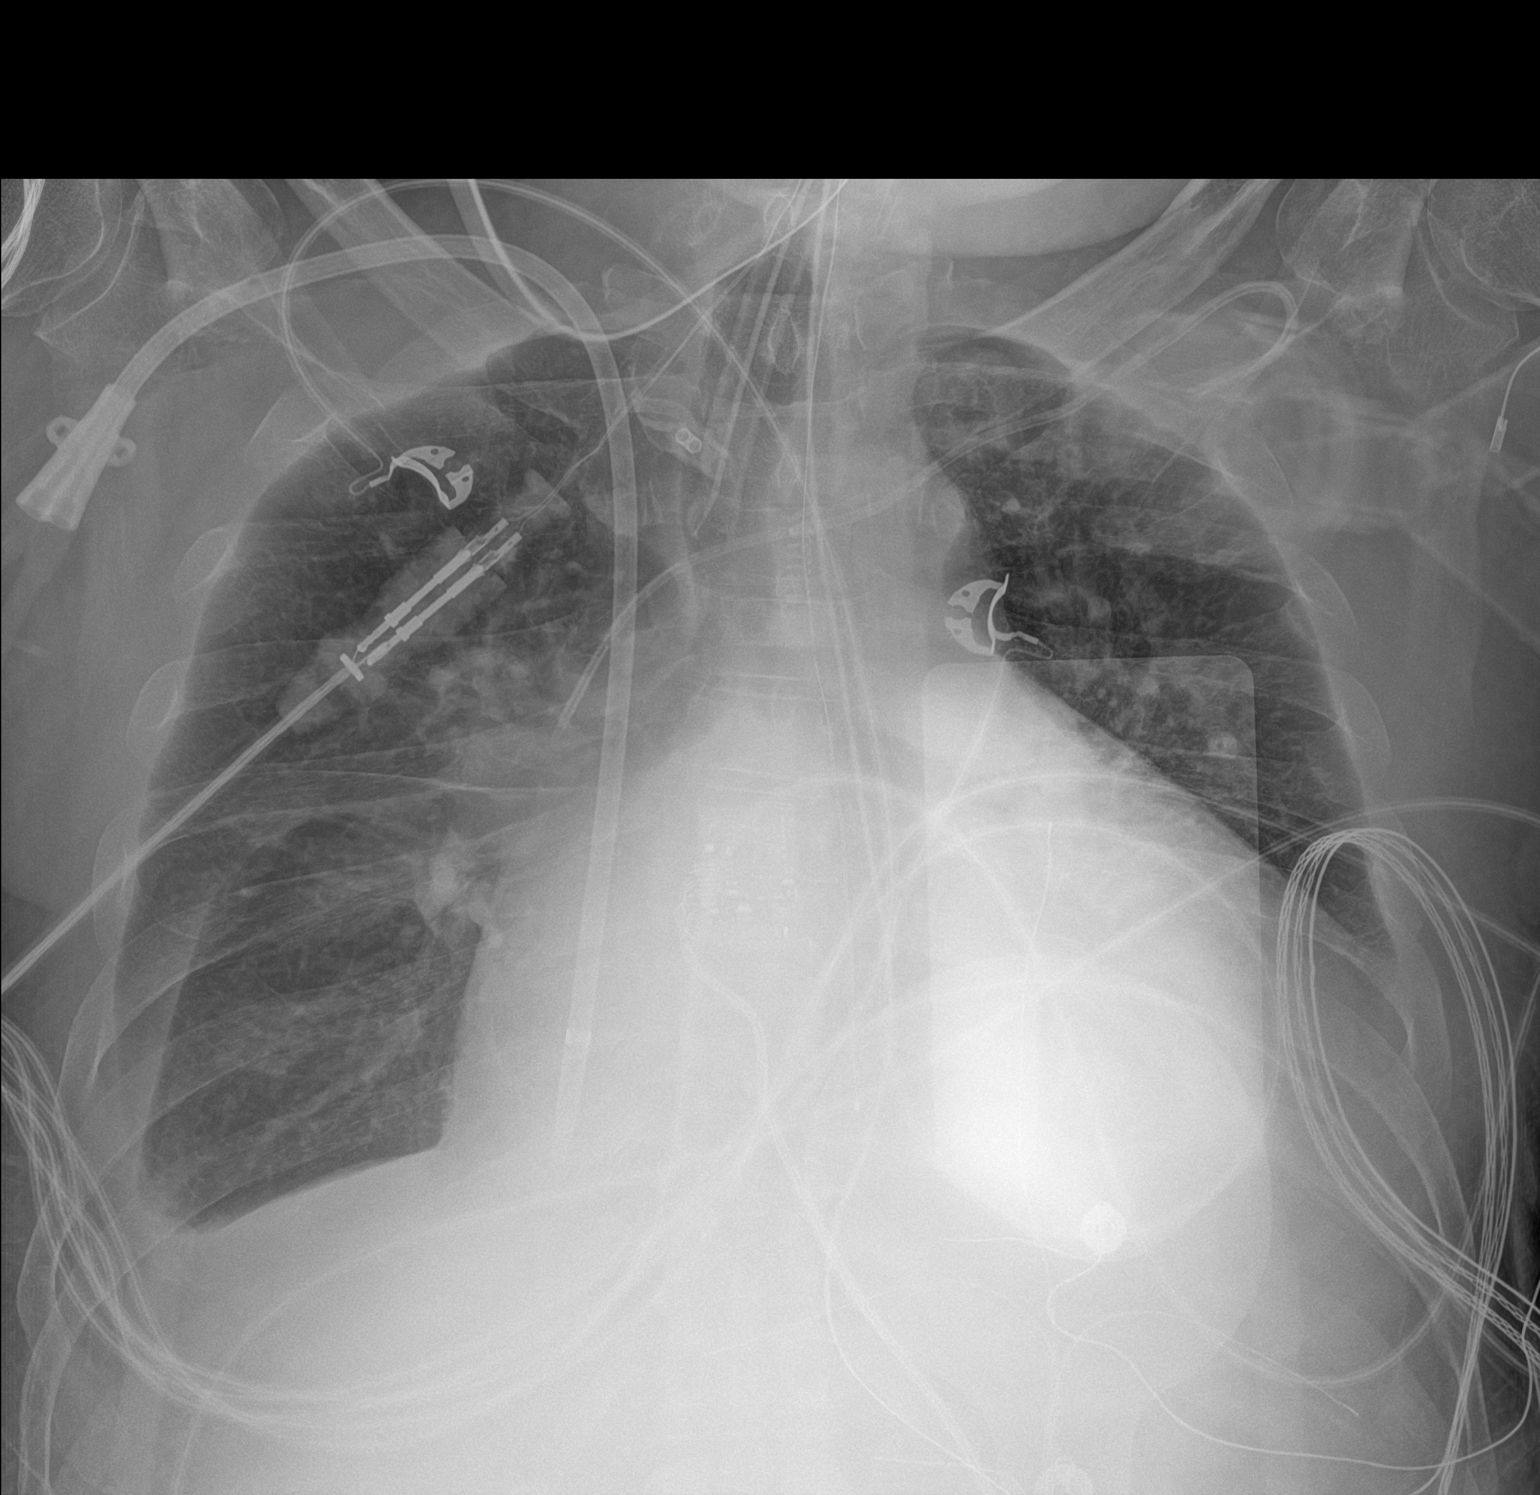

[1 of 1 positions shown; findings below may reference images not displayed]

FINDINGS: Endotracheal tube terminates in mid trachea, 4.2 cm from the carina.
A dual-lumen right IJ catheter sheath terminates at the level of the
right atrium. A left subclavian catheter terminates at the
brachiocephalic-caval confluence. Esophageal temperature probe
terminates in the lower thoracic esophagus. A transesophageal tube
tip terminates below the level of imaging. Telemetry leads and pacer
pads overlie the chest wall.

Stable cardiomegaly and pulmonary vascular congestion. Small
bilateral effusions with opacity in the left base likely reflecting
some additional atelectatic change. No pneumothorax. No acute
osseous abnormality.
IMPRESSION: Stable cardiomegaly and pulmonary vascular congestion.

Small bilateral pleural effusions with opacity in the left base
likely reflecting some additional atelectatic change.

Support devices as above.

## 2019-05-30 MED ORDER — DEXTROSE 50 % IV SOLN
INTRAVENOUS | Status: AC
  Administered 2019-05-30: 25 mL via INTRAVENOUS
  Filled 2019-05-30: qty 50

## 2019-05-30 MED ORDER — SODIUM CHLORIDE 0.9 % IV SOLN: 100.0000 mL | INTRAVENOUS | Status: DC | PRN

## 2019-05-30 MED ORDER — VITAL HIGH PROTEIN PO LIQD
1000.0000 mL | ORAL | Status: DC
  Administered 2019-05-30: 18:00:00 1000 mL

## 2019-05-30 MED ORDER — HEPARIN SODIUM (PORCINE) 1000 UNIT/ML DIALYSIS
1000.0000 [IU] | INTRAMUSCULAR | Status: DC | PRN
  Administered 2019-05-31 (×2): 1000 [IU] via INTRAVENOUS_CENTRAL
  Filled 2019-05-30: qty 1

## 2019-05-30 MED ORDER — HEPARIN SODIUM (PORCINE) 1000 UNIT/ML IJ SOLN
3.8000 mL | Freq: Once | INTRAMUSCULAR | Status: AC
  Administered 2019-05-30: 04:00:00 3800 [IU] via INTRAVENOUS

## 2019-05-30 MED ORDER — PENTAFLUOROPROP-TETRAFLUOROETH EX AERO: 1.0000 "application " | INHALATION_SPRAY | CUTANEOUS | Status: DC | PRN

## 2019-05-30 MED ORDER — DOXERCALCIFEROL 4 MCG/2ML IV SOLN
2.0000 ug | INTRAVENOUS | Status: DC
  Administered 2019-05-31 – 2019-06-03 (×2): 2 ug via INTRAVENOUS

## 2019-05-30 MED ORDER — DEXTROSE 50 % IV SOLN
12.5000 g | INTRAVENOUS | Status: AC
  Administered 2019-05-30: 12.5 g via INTRAVENOUS

## 2019-05-30 MED ORDER — LIDOCAINE HCL (PF) 1 % IJ SOLN: 5.0000 mL | INTRAMUSCULAR | Status: DC | PRN

## 2019-05-30 MED ORDER — CHLORHEXIDINE GLUCONATE 0.12% ORAL RINSE (MEDLINE KIT)
15.0000 mL | Freq: Two times a day (BID) | OROMUCOSAL | Status: DC
  Administered 2019-05-30 – 2019-06-01 (×6): 15 mL via OROMUCOSAL

## 2019-05-30 MED ORDER — ALTEPLASE 2 MG IJ SOLR: 2.0000 mg | Freq: Once | INTRAMUSCULAR | Status: DC | PRN

## 2019-05-30 MED ORDER — SODIUM CHLORIDE 0.9% FLUSH: 10.0000 mL | INTRAVENOUS | Status: DC | PRN

## 2019-05-30 MED ORDER — DEXTROSE 50 % IV SOLN
INTRAVENOUS | Status: AC
  Administered 2019-05-30: 12.5 g via INTRAVENOUS
  Filled 2019-05-30: qty 50

## 2019-05-30 MED ORDER — CHLORHEXIDINE GLUCONATE CLOTH 2 % EX PADS
6.0000 | MEDICATED_PAD | Freq: Every day | CUTANEOUS | Status: DC
  Administered 2019-05-31 – 2019-06-01 (×2): 6 via TOPICAL

## 2019-05-30 MED ORDER — SODIUM CHLORIDE 0.9% FLUSH
10.0000 mL | Freq: Two times a day (BID) | INTRAVENOUS | Status: DC
  Administered 2019-05-30 – 2019-05-31 (×2): 10 mL
  Administered 2019-05-31: 30 mL
  Administered 2019-06-01 – 2019-06-04 (×5): 10 mL

## 2019-05-30 MED ORDER — ORAL CARE MOUTH RINSE
15.0000 mL | OROMUCOSAL | Status: DC
  Administered 2019-05-30 – 2019-06-01 (×22): 15 mL via OROMUCOSAL

## 2019-05-30 MED ORDER — LIDOCAINE-PRILOCAINE 2.5-2.5 % EX CREA: 1.0000 "application " | TOPICAL_CREAM | CUTANEOUS | Status: DC | PRN

## 2019-05-30 MED ORDER — ATORVASTATIN CALCIUM 40 MG PO TABS
40.0000 mg | ORAL_TABLET | Freq: Every day | ORAL | Status: DC
  Administered 2019-05-30 – 2019-05-31 (×2): 40 mg
  Filled 2019-05-30 (×2): qty 1

## 2019-05-30 MED ORDER — DEXTROSE 50 % IV SOLN
12.5000 g | INTRAVENOUS | Status: AC
  Administered 2019-05-30: 20:00:00 12.5 g via INTRAVENOUS

## 2019-05-30 MED ORDER — DEXTROSE 50 % IV SOLN: 12.5000 g | INTRAVENOUS | Status: AC

## 2019-05-30 MED ORDER — PRO-STAT SUGAR FREE PO LIQD
60.0000 mL | Freq: Two times a day (BID) | ORAL | Status: DC
  Administered 2019-05-30 – 2019-05-31 (×3): 60 mL
  Filled 2019-05-30 (×4): qty 60

## 2019-05-30 NOTE — Progress Notes (Signed)
[facility]  Subjective  -cardiac arrest in OR yesterday prior to incision for planned right arm AV fistula versus graft.  Remains intubated in the ICU.  Troponins are trending up.  Bedside nursing reports some  concern for posturing.  No purposeful movement.  He received 4 minutes CPR yesterday.   Objective (!) 126/57 (!) 53 (!) 96.6 F (35.9 C) (Esophageal) (!) 28 99%  Intake/Output Summary (Last 24 hours) at 05/30/2019 0802 Last data filed at 05/30/2019 0700 Gross per 24 hour  Intake 1031.77 ml  Output 1000 ml  Net 31.77 ml    Right arm 1st stage basilic vein fistula with thrill  Laboratory Lab Results: Recent Labs    05/29/19 1628 05/29/19 1810 05/29/19 2014 05/30/19 0445  WBC 6.9  --   --  4.6  HGB 14.4   < > 16.0 14.2  HCT 48.9   < > 47.0 46.0  PLT 179  --   --  163   < > = values in this interval not displayed.   BMET Recent Labs    05/30/19 0001 05/30/19 0445  NA 139 139  K 5.4* 4.5  CL 99 100  CO2 24 25  GLUCOSE 96 64*  BUN 43* 28*  CREATININE 10.18* 7.54*  CALCIUM 8.9 8.8*    COAG Lab Results  Component Value Date   INR 1.3 (H) 05/30/2019   INR 1.4 (H) 05/29/2019   INR 1.22 05/14/2013   No results found for: PTT  Assessment/Planning:  64 year old male with end-stage renal disease as well as history of cardiomyopathy with severely reduced EF that had cardiac arrest in the OR yesterday prior to incision for planned right arm AV fistula versus graft.  Remains intubated in the ICU and critically ill.  His hemodynamics and ventilator settings have been stable overnight.  Biggest concern is neurologic exam given no purposeful movement and some concern for posturing by the bedside nurse.  He received 4 min CPR yesterday.  Appreciate neurology evaluation - EEG at this time.  Head CT yesterday showed no acute intracranial bleed yesterday.  Nephrology now following for his dialysis needs.  Also plan to get cardiology  involved today per critical care notes overnight given rising troponin.  Marty Heck 05/30/2019 8:02 AM --

## 2019-05-30 NOTE — Progress Notes (Signed)
LTM maint complete - no skin breakdown under: KX3,G1,W2

## 2019-05-30 NOTE — Progress Notes (Signed)
Initial Nutrition Assessment  DOCUMENTATION CODES:   Not applicable  INTERVENTION:   Tube feeding:  -Vital High Protein @ 45 ml/hr (1080 ml) via OGT -60 ml Prostat BID  Provides: 1480 kcals (1756 kcal with propofol), 155 grams protein, 903 ml free water.   NUTRITION DIAGNOSIS:   Increased nutrient needs related to chronic illness(ESRD/HD) as evidenced by estimated needs.  GOAL:   Patient will meet greater than or equal to 90% of their needs  MONITOR:   Diet advancement, Skin, Vent status, TF tolerance, Weight trends, Labs, I & O's  REASON FOR ASSESSMENT:   Ventilator    ASSESSMENT:   Patient with PMH significant for ESRD/HD, HLD, HTN, and nonischemic cardiomyopathy. Presents this admission for revision of R AVF.   1/27- cardiac arrest prior to surgery from unclear etiology, AVF revision aborted   Pt discussed during ICU rounds and with RN.   On TTM 36. Had HD last night, next treatment planned for tomorrow. Off pressors. Remains on propofol. Okay to start feeding per CCM (resident).   Spoke with wife at bedside who reports pt had great appetite PTA. Typically eats three meals daily with snacks. Unsure of EDW but does not suspect pt has lost weight.    EDW: 79 kg  Current weight: 83.6 kg   Patient is currently intubated on ventilator support MV: 13.6 L/min Temp (24hrs), Avg:96.6 F (35.9 C), Min:94.8 F (34.9 C), Max:97.7 F (36.5 C)  Propofol: 10.45 ml/hr- provides 276 kcal from lipids daily   I/O: +520 ml since admit HD yesterday: 900 ml net UF   Drips: D5 in NS @ 50 ml/hr, propofol  Medications: hectorol, renevela, prednisolone Labs: Phosphorus 5.8 (H)   NUTRITION - FOCUSED PHYSICAL EXAM:    Most Recent Value  Orbital Region  No depletion  Upper Arm Region  No depletion  Thoracic and Lumbar Region  Unable to assess  Temple Region  No depletion  Clavicle Bone Region  No depletion  Clavicle and Acromion Bone Region  No depletion  Scapular Bone  Region  Unable to assess  Dorsal Hand  No depletion  Patellar Region  No depletion  Anterior Thigh Region  No depletion  Posterior Calf Region  No depletion  Edema (RD Assessment)  Unable to assess  Hair  Unable to assess  Eyes  Unable to assess  Mouth  Unable to assess  Skin  Unable to assess  Nails  Unable to assess     Diet Order:   Diet Order    None      EDUCATION NEEDS:   Not appropriate for education at this time  Skin:  Skin Assessment: Reviewed RN Assessment  Last BM:  12/27  Height:   Ht Readings from Last 1 Encounters:  05/29/19 5\' 6"  (1.676 m)    Weight:   Wt Readings from Last 1 Encounters:  05/30/19 83.6 kg    Ideal Body Weight:  64.5 kg  BMI:  Body mass index is 29.75 kg/m.  Estimated Nutritional Needs:   Kcal:  1698 kcal  Protein:  125-145 grams  Fluid:  1000 ml + UOP (liberalized with CRRT)   Mariana Single RD, LDN Clinical Nutrition Pager # [phone]

## 2019-05-30 NOTE — Procedures (Addendum)
Patient Name: Michael [name].  [name]: [id number]  Epilepsy Attending: Lora Havens  Referring Physician/Provider: Dr Kipp Brood Duration: 05/29/2019 1746 to 05/30/2019 1746  Patient history: 64yo M s/p cardiac arrest on TTM. EEG to evaluate for seizure  Level of alertness: comatose  AEDs during EEG study: propofol  Technical aspects: This EEG study was done with scalp electrodes positioned according to the 10-20 International system of electrode placement. Electrical activity was acquired at a sampling rate of 500Hz  and reviewed with a high frequency filter of 70Hz  and a low frequency filter of 1Hz . EEG data were recorded continuously and digitally stored.   DESCRIPTON: EEG initially showed continuous generalized background suppression. Gradually EEG showed continuous generalized 3-6Hz  theta-delta slowing. EG was not reactive to tactile stimuli. Hyperventilation and photic stimulation were not performed.  ABNORMALITY - Background suppression, generalized  - Continuous slowing, generalized   IMPRESSION: This study is suggestive of profound diffuse encephalopathy, non specific to etiology. No seizures or epileptiform discharges were seen throughout the recording.  Michael [name]

## 2019-05-30 NOTE — Progress Notes (Signed)
  Hypoglycemic Event  CBG: 60  Treatment: D50 25 mL (12.5 gm) 1950  Symptoms: None, UTA pt is sedated  Follow-up CBG: Time:2008 CBG Result:102  Possible Reasons for Event: Inadequate meal intake  Comments/MD notified: Tube feeds paused, now resumed    Sigurd Sos

## 2019-05-30 NOTE — Progress Notes (Addendum)
Hypoglycemic Event  CBG: 67 at 1314  Treatment: 12.5g dextrose 50% 1230  Symptoms: N/A/UTA - pt sedated and vented. No visible symptoms.   Follow-up CBG: Time: 1344 CBG Result: 130  Possible Reasons for Event: NPO   Comments/MD notified:Winters MD notified - orders to increase rate of D5 in NS to 58ml/h    [place]

## 2019-05-30 NOTE — Progress Notes (Addendum)
[facility] Progress Note  Subjective: seen in ICU, pt on vent and sedated. 1 L off w/ HD overnight  Vitals:   05/30/19 0430 05/30/19 0445 05/30/19 0500 05/30/19 0507  BP: 93/61 (!) 90/57    Pulse: (!) 57 (!) 58 (!) 57 (!) 57  Resp: (!) 0 20 16 (!) 28  Temp:      TempSrc:      SpO2: (!) 86% 92% 93% 99%  Weight:      Height:        Exam: General: adult male critically ill on vent  HEENT: NCAT ET tube in place Neck: trachea midline  Heart: S1S2 no rub  113/79 blood pressure on my exam  Lungs: coarse mechanical breath sounds  Abdomen: softly distended/obese habitus dec bowel sounds Extremities: 1+ edema bilateral lower extremities Skin: no rash on extremities exposed Neuro: sedation running Access: RIJ tunneled catheter    NW MWF 4.5h  400/800   79kg  2/2 bath  TDC    Hep 4000 - no esa active - last Hb 13.9 on 1/22 - parsabiv 2.5 mg 3 x/week - hectorol 2 mcg IV each tx  CXR 1/27 - vasc congestion   Assessment/Plan:  # Cardiac arrest  - supportive care per critical care   - patient is on hypothermia protocol   # Acute hypoxic respiratory failure  - on vent per critical care    - optimize volume status, up 4kg per wts  # Nonischemic cardiomyopathy  - optimize volume with dialysis    # ESRD via TDC - Normally MWF schedule for HD  - had HD last night, next HD tomorrow  # Secondary hyperpara  - Can resume hectorol   # HTN  - normotensive currently not requiring pressors      Rob Kadeisha Betsch 05/30/2019, 5:08 AM  Inpatient medications: . acyclovir  400 mg Oral Q lunch  . atorvastatin  40 mg Oral QHS  . chlorhexidine gluconate (MEDLINE KIT)  15 mL Mouth Rinse BID  . Chlorhexidine Gluconate Cloth  6 each Topical Q0600  . dorzolamide-timolol  1 drop Both Eyes BID  . heparin  5,000 Units Subcutaneous Q8H  .  HYDROmorphone (DILAUDID) injection  0.5 mg Intravenous Once  . mouth rinse  15 mL Mouth Rinse 10 times per day  . pantoprazole  sodium  40 mg Per Tube Daily  . prednisoLONE acetate  1 drop Right Eye Q1200  . sevelamer carbonate  1,600 mg Per Tube TID WC   . sodium chloride    . sodium chloride    . dextrose 5 % and 0.9% NaCl 50 mL/hr at 05/30/19 0400  . HYDROmorphone 1.5 mg/hr (05/30/19 0400)  . norepinephrine (LEVOPHED) Adult infusion 5 mcg/min (05/30/19 0400)  . propofol (DIPRIVAN) infusion 15 mcg/kg/min (05/30/19 0400)   sodium chloride, sodium chloride, albuterol, alteplase, bisacodyl, heparin, heparin, HYDROmorphone, lidocaine (PF), lidocaine-prilocaine, pentafluoroprop-tetrafluoroeth, sennosides Recent Labs  Lab 05/29/19 0817 05/29/19 1146 05/29/19 1300 05/29/19 1628 05/29/19 1810 05/29/19 2014 05/30/19 0001  NA 137 141 136 142 136 135 139  K 4.0 5.4* 4.5 5.2* 5.3* 5.2* 5.4*  CL 101 102  --  99  --   --  99  CO2  --  22  --  25  --   --  24  GLUCOSE 81 156*  --  75  --   --  96  BUN 34* 35*  --  39*  --   --  43*  CREATININE 10.50* 10.00*  --  10.79*  --   --  10.18*  CALCIUM  --  8.8*  --  9.0  --   --  8.9

## 2019-05-30 NOTE — Progress Notes (Addendum)
NAME:  Michael Horrigan., [name]:  [id number], DOB:  [birth year], LOS: 1 ADMISSION DATE:  05/29/2019, CONSULTATION DATE:  05/28/2018 REFERRING MD:  Wilmer Floor - Vascular surgery, CHIEF COMPLAINT:  Cardiac arrest   Brief History   64 y/o male with a PMH of ESRD, HLD, HTN, OSA, V. Tach, and Hyperparathyroidism, presents s/p pre operative cardiac arrest.  Past Medical History   Past Medical History:  Diagnosis Date  . Atrial arrhythmia    atrial tachycardia with variable AV conduction versus atypical aflutter 01/10/19, rate control (02/06/19)  . Cataract   . Dyspnea    on exertion  . ESRD (end stage renal disease) ([place])    H[place]- MWF, Polycystic kidney disease  . Fatigue   . History of kidney stones    removal of stone- cysto  . Hyperlipidemia   . Hyperparathyroidism, secondary renal ([place])   . Hypertension   . Hypoxemia 12/12/2013  . Nonischemic cardiomyopathy ([place])    Er 25% 2015, 55 % 2013  . OSA on CPAP    no longer using cpap  . OSA on CPAP 03/24/2014  . Pneumonia    2015ish  . Ventricular tachycardia//Freq PVCs   . Wears glasses    Significant Hospital Events   1/27: Cardiac Arrest pre op.  1/28: O/N Troponin elevation.  Consults:  PCCM Nephrology  Procedures:  Aborted right fistula revision   Significant Diagnostic Tests:  ECG 1/27 post arrest: NSR with LAD, non-specific IVCD with possible LVH by voltage criteria. No change from prior.  CT Head 1/27: Generalized cerebral volume loss since 2010 but otherwise noncon CT appearance of the brain is within normal limits.   EEG 1/27: Suggestive of profound diffuse encephalopathy, non specific to etiology. No seizures or epileptiform discharges were seen throughout the recording.  Micro Data:  N/A  Antimicrobials:  N/A   Interim history/subjective:  Intubated, unresponsive.   Objective   Blood pressure (!) 126/57, pulse (!) 53, temperature (!) 96.6 F (35.9 C), temperature source Esophageal, resp. rate (!) 28, height  5\' 6"  (1.676 m), weight 83.6 kg, SpO2 99 %.    Vent Mode: PRVC FiO2 (%):  [40 %-100 %] 40 % Set Rate:  [18 bmp-28 bmp] 28 bmp Vt Set:  [500 mL] 500 mL PEEP:  [5 cmH20] 5 cmH20 Plateau Pressure:  [14 cmH20-35 cmH20] 30 cmH20   Intake/Output Summary (Last 24 hours) at 05/30/2019 0750 Last data filed at 05/30/2019 0700 Gross per 24 hour  Intake 1031.77 ml  Output 1000 ml  Net 31.77 ml   Filed Weights   05/29/19 0734 05/30/19 0500  Weight: 87.1 kg 83.6 kg    Examination: General: Sedated and intubated, lying in bed HENT: normocephalic, atraumatic, mucous membranes moist, No scleral icterus presents. Intubated.  Lungs: Coarse sounds auscultated bilaterally.  Cardiovascular: Bradycardic, RR, no murmurs, rubs, gallops Abdomen: non distended, non tender, BS+ Extremities: 1+ pitting edema in the lower extremities bilaterally  Neuro:  Sedated, PERRLA,  Skin: Dry and cool   Resolved Hospital Problem list   Cardiac Arrest:   Assessment & Plan:  Cardiac Arrest:  Underlying etiology currently unclear, but likely due to respiratory failure due to sedative use before his operation for AV fistula correction.  - Continue Code Cool Protocol - CT Results do not show hypoxic/anoxic injury.  - EEG results do not show seizure activity.   Troponinemia:  Over the course of the evening of 1/27 patient experienced troponemia. Per the evening note, NP reached out to cardiology and  ordered EKG.  - 1582<1392<977<658  Acute Hypoxic Respiratory Failure:  Requires full ventilation support at this time.  - Continue to monitor during code cool with plans to extubate after protocol and allowance per patient mental state.  - ABG: pH 7.37/pCO2 40/Bicarb 23/PO2 189  Non Ischemic Cardiomyopathy with biventricular Dysfunction:  - Continue holding home HF medications.   ESRD V:   MWF HD Sessions - Nephrology consulted, we appreciate their recommendations.  Daily Goals Checklist   DVT prophylaxis:  heparin tid Nutrition Status: Tube Feeds GI prophylaxis: protonix  Mobility/therapy: bedrest. Code Status: Full code Disposition: ICU  Labs   CBC: Recent Labs  Lab 05/29/19 1300 05/29/19 1628 05/29/19 1810 05/29/19 2014 05/30/19 0445  WBC  --  6.9  --   --  4.6  NEUTROABS  --  5.8  --   --   --   HGB 15.3 14.4 17.0 16.0 14.2  HCT 45.0 48.9 50.0 47.0 46.0  MCV  --  86.4  --   --  82.7  PLT  --  179  --   --  992    Basic Metabolic Panel: Recent Labs  Lab 05/29/19 0817 05/29/19 0817 05/29/19 1146 05/29/19 1300 05/29/19 1628 05/29/19 1810 05/29/19 2014 05/30/19 0001 05/30/19 0445  NA 137   < > 141   < > 142 136 135 139 139  K 4.0   < > 5.4*   < > 5.2* 5.3* 5.2* 5.4* 4.5  CL 101  --  102  --  99  --   --  99 100  CO2  --   --  22  --  25  --   --  24 25  GLUCOSE 81  --  156*  --  75  --   --  96 64*  BUN 34*  --  35*  --  39*  --   --  43* 28*  CREATININE 10.50*  --  10.00*  --  10.79*  --   --  10.18* 7.54*  CALCIUM  --   --  8.8*  --  9.0  --   --  8.9 8.8*  MG  --   --   --   --   --   --   --   --  2.2  PHOS  --   --   --   --   --   --   --   --  5.8*   < > = values in this interval not displayed.   GFR: Estimated Creatinine Clearance: 10.4 mL/min (A) (by C-G formula based on SCr of 7.54 mg/dL (H)). Recent Labs  Lab 05/29/19 1628 05/29/19 2002 05/30/19 0445  WBC 6.9  --  4.6  LATICACIDVEN 3.3* 1.9  --     Liver Function Tests: Recent Labs  Lab 05/29/19 1628  AST 22  ALT 11  ALKPHOS 34*  BILITOT 0.9  PROT 5.8*  ALBUMIN 3.1*   No results for input(s): LIPASE, AMYLASE in the last 168 hours. No results for input(s): AMMONIA in the last 168 hours.  ABG    Component Value Date/Time   PHART 7.418 05/30/2019 0418   PCO2ART 38.1 05/30/2019 0418   PO2ART 194 (H) 05/30/2019 0418   HCO3 24.4 05/30/2019 0418   TCO2 25 05/29/2019 2014   ACIDBASEDEF 2.0 05/29/2019 2014   O2SAT 99.4 05/30/2019 0418     Coagulation Profile: Recent Labs  Lab  05/29/19 1628 05/30/19 0445  INR 1.4*  1.3*    Cardiac Enzymes: No results for input(s): CKTOTAL, CKMB, CKMBINDEX, TROPONINI in the last 168 hours.  HbA1C: No results found for: HGBA1C  CBG: Recent Labs  Lab 05/29/19 2008 05/29/19 2044 05/30/19 0003 05/30/19 0451 05/30/19 0719  GLUCAP 64* 94 83 79 86     Maudie Mercury, MD IMTS, PGY-1 Pager: [phone]

## 2019-05-31 ENCOUNTER — Encounter (HOSPITAL_COMMUNITY): Payer: Self-pay | Admitting: Pulmonary Disease

## 2019-05-31 LAB — BASIC METABOLIC PANEL
Anion gap: 14 (ref 5–15)
BUN: 40 mg/dL — ABNORMAL HIGH (ref 8–23)
CO2: 22 mmol/L (ref 22–32)
Calcium: 8.3 mg/dL — ABNORMAL LOW (ref 8.9–10.3)
Chloride: 101 mmol/L (ref 98–111)
Creatinine, Ser: 9.38 mg/dL — ABNORMAL HIGH (ref 0.61–1.24)
GFR calc Af Amer: 6 mL/min — ABNORMAL LOW (ref >60)
GFR calc non Af Amer: 5 mL/min — ABNORMAL LOW (ref >60)
Glucose, Bld: 94 mg/dL (ref 70–99)
Potassium: 3.7 mmol/L (ref 3.5–5.1)
Sodium: 137 mmol/L (ref 135–145)

## 2019-05-31 LAB — CBC
HCT: 42.9 % (ref 39.0–52.0)
Hemoglobin: 13.5 g/dL (ref 13.0–17.0)
MCH: 25.4 pg — ABNORMAL LOW (ref 26.0–34.0)
MCHC: 31.5 g/dL (ref 30.0–36.0)
MCV: 80.8 fL (ref 80.0–100.0)
Platelets: 150 10*3/uL (ref 150–400)
RBC: 5.31 MIL/uL (ref 4.22–5.81)
RDW: 19.9 % — ABNORMAL HIGH (ref 11.5–15.5)
WBC: 4.8 10*3/uL (ref 4.0–10.5)
nRBC: 0 % (ref 0.0–0.2)

## 2019-05-31 LAB — GLUCOSE, CAPILLARY
Glucose-Capillary: 106 mg/dL — ABNORMAL HIGH (ref 70–99)
Glucose-Capillary: 109 mg/dL — ABNORMAL HIGH (ref 70–99)
Glucose-Capillary: 173 mg/dL — ABNORMAL HIGH (ref 70–99)
Glucose-Capillary: 42 mg/dL — CL (ref 70–99)
Glucose-Capillary: 82 mg/dL (ref 70–99)
Glucose-Capillary: 87 mg/dL (ref 70–99)
Glucose-Capillary: 89 mg/dL (ref 70–99)

## 2019-05-31 LAB — TRIGLYCERIDES: Triglycerides: 98 mg/dL (ref <150)

## 2019-05-31 MED ORDER — ACETAMINOPHEN 650 MG RE SUPP: 650.0000 mg | RECTAL | Status: DC | PRN

## 2019-05-31 MED ORDER — DEXTROSE 50 % IV SOLN
INTRAVENOUS | Status: AC
  Administered 2019-05-31: 50 mL
  Filled 2019-05-31: qty 50

## 2019-05-31 MED ORDER — HEPARIN SODIUM (PORCINE) 1000 UNIT/ML IJ SOLN
INTRAMUSCULAR | Status: AC
  Filled 2019-05-31: qty 8

## 2019-05-31 MED ORDER — DOXERCALCIFEROL 4 MCG/2ML IV SOLN
INTRAVENOUS | Status: AC
  Filled 2019-05-31: qty 2

## 2019-05-31 MED ORDER — ONDANSETRON HCL 4 MG/2ML IJ SOLN
4.0000 mg | Freq: Four times a day (QID) | INTRAMUSCULAR | Status: DC | PRN
  Administered 2019-05-31: 4 mg via INTRAVENOUS
  Filled 2019-05-31: qty 2

## 2019-05-31 MED ORDER — DEXTROSE 50 % IV SOLN
INTRAVENOUS | Status: AC
  Filled 2019-05-31: qty 50

## 2019-05-31 NOTE — Progress Notes (Signed)
Upon administration of 10 AM med down OG tube, patient projectile vomited. OG connection to suction. 800 cc suctioned out plus unmeasured occurrence on bed sheets.   MD aware.  zofran ordered. Patient states he feels better after vomiting.   RN will continue to monitor patient closely.

## 2019-05-31 NOTE — Progress Notes (Signed)
Approx [phone]  Sedation has been off since early am, remains on vent d/t not passing SBT/weaning today. Pt has been calm, cooperative, mouthing words, following commands, forgetful (frequently mouths 'what happened?' and 'where is my wife?". Reoriented and reassured. Became acutely frustrated about ETT, attempting to pull out tube (safety mitts in place) and climb out of bed. Staff x 4 to bedside to assist, provide reassurance/redirection. Pt became very agitated, RASS +3, HR 110s. Paged [place], order obtained and implemented for bilat soft limb wrist restraints to keep pt safe and to restart Diprivan gtt. Goal to rest pt safely on vent overnight, wean sedation off as able by am to maximize successful extubation.   Pt calm on diprivan gtt @ 25 mcg/kg/min.

## 2019-05-31 NOTE — Procedures (Addendum)
Name:Michael [name]. BTD:[id number] Epilepsy Attending:Shinichi Anguiano Barbra Sarks Referring Physician/Provider:Dr Kipp Brood Duration: 05/30/2019 1746 to 05/31/2019 1002  Patient history:63yo M s/p cardiac arrest on TTM. EEG to evaluate for seizure  Level of alertness:awake, lethargic/sedated  AEDs during EEG study:propofol  Technical aspects: This EEG study was done with scalp electrodes positioned according to the 10-20 International system of electrode placement. Electrical activity was acquired at a sampling rate of 500Hz  and reviewed with a high frequency filter of 70Hz  and a low frequency filter of 1Hz . EEG data were recorded continuously and digitally stored.  DESCRIPTON:During awake state, no clear posterior dominant rhythm was seen.  EEG showed continuous generalized 3 to 6 Hz theta-delta slowing. Hyperventilation and photic stimulation were not performed.  ABNORMALITY -Continuous slow, generalized  IMPRESSION: This study issuggestive of moderate to severe diffuse encephalopathy, non specific to etiology.No seizures or epileptiform discharges were seen throughout the recording.  EEG appears to be improving compared to previous study.     Deshawna Mcneece Barbra Sarks

## 2019-05-31 NOTE — Progress Notes (Signed)
Hypoglycemic Event  CBG: 61  Treatment:D50 mL (12.5gm) 2355   Symptoms: None, UTA pt is sedated   Follow-up CBG: Time: 0019 CBG Result:106  Possible Reasons for Event: Inadequate meal intake  Comments/MD notified: Tube feeds continued    Sigurd Sos

## 2019-05-31 NOTE — Progress Notes (Signed)
eLink Physician-Brief Progress Note Patient Name: Michael [name]. DOB: 04-24-1956 [name]: [id number]   Date of Service  05/31/2019  HPI/Events of Note  Restraints requested by RN until sedation helps due to agitation.  eICU Interventions  Soft wrist restraints ordered for no more than 8 hours; DC sooner if improved.      Intervention Category Minor Interventions: Other:  Richardson Dopp, PA-C  05/31/2019, 11:17 PM

## 2019-05-31 NOTE — Progress Notes (Signed)
vLTM EEG complete. No skin breakdown 

## 2019-05-31 NOTE — Progress Notes (Signed)
40 mL of dilaudid wasted in sink with [employer] as second Psychologist, counselling.

## 2019-05-31 NOTE — Progress Notes (Signed)
Attempted vent weaning (per discussion w/ CCM Dr Agarwala= ok to wean).  Pt weaned for about 10 minutes, then developed low minute volume 2-3 lpm. Placed back on full vent support.

## 2019-05-31 NOTE — Progress Notes (Signed)
Pt vent alarming apnea back-up ventilation mode.  Pt changed back to full vent support, RN in room and aware. VSS currently.

## 2019-05-31 NOTE — Transfer of Care (Addendum)
Immediate Anesthesia Transfer of Care Note  Patient: Michael [name].  Procedure(s) Performed: FISTULA SUPERFICIALIZATION VERSES ARTERIOVENOUS GRAFT (Right )  Patient Location: PACU and ICU  Anesthesia Type:MAC  Level of Consciousness: unresponsive  Airway & Oxygen Therapy: Patient placed on Ventilator (see vital sign flow sheet for setting)  Post-op Assessment: Report given to RN  Post vital signs: Reviewed and stable  Last Vitals:  Vitals Value Taken Time  BP 103/71 05/31/19 1430  Temp 37.2 C 05/31/19 1400  Pulse 62 05/31/19 1230  Resp 28 05/31/19 1445  SpO2 100 % 05/31/19 1150  Vitals shown include unvalidated device data.  Last Pain:  Vitals:   05/31/19 1400  TempSrc: Core (Comment)  PainSc:       Patients Stated Pain Goal: 2 (51/83/35 8251)  Complications: respiratory complications and cardiovascular complicationsPt arrested prior to procedure beginning. Pt suddenly grimaced/clencehd his jaw/attempted to put OPA,unsuccessful/attempted #5 LMA, Pt still biting down. #4 LMA successfully placed/ ventilated. LMA exchanged for 7.5  ETT, DLx1 7.5 . Dr Ermalene Postin present during this time CPR began x 4 minutes. During this time and shortly after pt moved his hands, lid reflex present upon moving pt to  [employer]. Remained intubated/o2 per ambu for transport, monitors on. Previous to arrest pt was awake, talking, not appearing sedated. OR table had just been turned with pts head away from CRNA to prepare for right arm surgery. Dr Ermalene Postin was called as soon as change was noted and CRNA attempting to place LMA. The situation worsened within a minute as Dr Ermalene Postin arrived. Pt transported to [facility] after stablized. Report given. Angelica Chessman CRNA

## 2019-05-31 NOTE — Progress Notes (Signed)
Received a call from a RN stating we need to place a LMA because the patient "was dropping their sat." Upon questioning, she let me know the patient she was referring to was in OR 11. When I entered OR 11, the patient was on the operative table turned 90 degrees from the anesthetic machine. A LMA was in the patient's mouth and the CRNA was actively squeezing the reservoir bag on the anesthesia machine. The patient had a rhythm, consistent with their baseline, on EKG, blood pressure reading, but the oxygen saturation was not reading, the plethysmography wave was poor, there was no EtCO2 present on the monitor, and due to surgical drapes, I was unable to see if the patient's chest was rising with ventilation.  As I reached the anesthetic machine, I noticed the primary gas oxygen was not turned on and no gas (oxygen or air) flow was being delivered to the LMA via the connected anesthetic circuit. I immediately turned on the oxygen, began to check the circuit connectivity, and found that the capnography sample line was not connected to the anesthetic machine. Once connected, EtCO2 was present by capnography and I pulled back the drapes to visualize chest rise with ventilation. With a continued poor plethysmograph, I asked the CRNA to check the pulse ox and work to obtain better monitors. The BP cuff was cycled while the surgeon and I felt for a pulse. The surgeon reported palpation of a "thready pulse" in the patient's right upper extremity. Suspecting hypotension, I opened up the IV fluids, gave vasopressors, and continued to work on our monitors. While evaluating for improvement in oxygenation and perfusion, the anesthetic monitor went black, resetting, and not displaying vitals for ~ minute. With the monitor functional, no immediate improvement in plethysmography waveform was seen and the blood pressure reported unreadable prompting me to again check for a left femoral and then right carotid pulse. Unable to palpate  a pulse I began chest compressions at 1116  and 1mg  epinephrine was given for management of PEA. OR Code Blue was called, and help was obtained. During chest compressions a leak from the LMA was audible and out of concern hypoxia contributed to the cardiac arrest, I ask a responding CRNA exchanged the LMA for an ETT. This was done successfully without interruption of chest compressions. At three minutes, a second dose (1mg ) of Epinephrine was given. Pulse check at 4 minutes showed the patient had return of spontaneous circulation. Sedation was held and flumazenil was given to evaluate acute mental status. Patient would sporadically open eyes and initiate inspiration but would not follow commands. Patient was stabilized with supportive measures and transferred, intubated with monitors, to the ICU for care status post intraoperative cardiac arrest. Report was given to ICU team.   Oleta Mouse, MD

## 2019-05-31 NOTE — Progress Notes (Signed)
Patient had BG of 42 per nurse tech. RN treated with 1 amp of dextrose. Upon recheck, BG was 44. Rechecked again centrally and the BG was 173.  RN will continue to monitor patient closely.

## 2019-05-31 NOTE — Progress Notes (Signed)
NAME:  Michael Moga., [name]:  [id number], DOB:  Sep 28, 1955, LOS: 2 ADMISSION DATE:  05/29/2019, CONSULTATION DATE:  05/28/2018 REFERRING MD:  Wilmer Floor - Vascular surgery, CHIEF COMPLAINT:  Cardiac Arrest   Brief History   64 y/o male with a PMH of ESRD, HLD, HTN, OSA, V. Tach, and Hyperparathyroidism, presents s/p pre operative cardiac arrest.  Past Medical History   Past Medical History:  Diagnosis Date  . Atrial arrhythmia    atrial tachycardia with variable AV conduction versus atypical aflutter 01/10/19, rate control (02/06/19)  . Cataract   . Dyspnea    on exertion  . ESRD (end stage renal disease) ([place])    Hemo- MWF, Polycystic kidney disease  . Fatigue   . History of kidney stones    removal of stone- cysto  . Hyperlipidemia   . Hyperparathyroidism, secondary renal ([place])   . Hypertension   . Hypoxemia 12/12/2013  . Nonischemic cardiomyopathy ([place])    Er 25% 2015, 55 % 2013  . OSA on CPAP    no longer using cpap  . OSA on CPAP 03/24/2014  . Pneumonia    2015ish  . Ventricular tachycardia//Freq PVCs   . Wears glasses    Significant Hospital Events   1/27: Cardiac Arrest pre op.  1/28: O/N Troponin elevation. Rewarm to 37C discontinued sedation 1/29: Tracks to voice, able to follow some commands. Will continue CRRT, attempt wean, and if successful, extubate.    Consults:  PCCM Nephrology  Procedures:  Aborted right fistula revision   Significant Diagnostic Tests:  ECG 1/27 post arrest: NSR with LAD, non-specific IVCD with possible LVH by voltage criteria. No change from prior.  CT Head 1/27: Generalized cerebral volume loss since 2010 but otherwise noncon CT appearance of the brain is within normal limits.   EEG 1/27: Suggestive of profound diffuse encephalopathy, non specific to etiology. No seizures or epileptiform discharges were seen throughout the recording.  Micro Data:  N/A  Antimicrobials:  N/A   Interim history/subjective:  Responded to voice,  able to wiggle toes on command. Nonverbal. Off sedation.   Objective   Blood pressure 121/79, pulse 60, temperature 98.6 F (37 C), temperature source Esophageal, resp. rate (!) 27, height 5\' 6"  (1.676 m), weight 82.2 kg, SpO2 100 %.    Vent Mode: PRVC FiO2 (%):  [40 %] 40 % Set Rate:  [28 bmp] 28 bmp Vt Set:  [500 mL] 500 mL PEEP:  [5 cmH20] 5 cmH20 Plateau Pressure:  [22 cmH20-30 cmH20] 24 cmH20   Intake/Output Summary (Last 24 hours) at 05/31/2019 0659 Last data filed at 05/31/2019 0400 Gross per 24 hour  Intake 1286.79 ml  Output 0 ml  Net 1286.79 ml   Filed Weights   05/29/19 0734 05/30/19 0500 05/31/19 0500  Weight: 87.1 kg 83.6 kg 82.2 kg    Examination: General: Awoken to verbal stimuli, no acute distress.  HENT: normocephalic, atraumatic, mucous membranes moist, No scleral icterus presents. Intubated.  Lungs: Coarse sounds auscultated bilaterally.  Cardiovascular: RRR, no murmurs, rubs, gallops Abdomen: non distended, non tender, BS+ Extremities: 1+ pitting edema in the lower extremities bilaterally  Neuro:  Awake, able to follow some commands.  Skin: Dry and warm  Resolved Hospital Problem list   Cardiac Arrest:   Assessment & Plan:  Cardiac Arrest:  Underlying etiology currently unclear, but likely due to respiratory failure due to sedative use before his operation for AV fistula correction.  - Rewarming patient to 37 C,  - CT  Results do not show hypoxic/anoxic injury.  - EEG results do not show seizure activity.  Acute Hypoxic Respiratory Failure:  Requires full ventilation support at this time.  - Continue to monitor during code cool with plans to extubate after protocol and allowance per patient mental state.  - Will attempt SBT, if successful, will plan on extubation.   Non Ischemic Cardiomyopathy with biventricular Dysfunction:  - Continue holding home HF medications.   ESRD V:   MWF HD Sessions - Nephrology consulted, we appreciate their  recommendations.  - Completing CRRT.  Daily Goals Checklist   DVT prophylaxis: Heparin tid Nutrition Status: Tube Feeds GI prophylaxis: protonix  Mobility/therapy: bedrest. Code Status: Full code Disposition: ICU  Labs   CBC: Recent Labs  Lab 05/29/19 1300 05/29/19 1628 05/29/19 1810 05/29/19 2014 05/30/19 0445  WBC  --  6.9  --   --  4.6  NEUTROABS  --  5.8  --   --   --   HGB 15.3 14.4 17.0 16.0 14.2  HCT 45.0 48.9 50.0 47.0 46.0  MCV  --  86.4  --   --  82.7  PLT  --  179  --   --  147    Basic Metabolic Panel: Recent Labs  Lab 05/29/19 1628 05/29/19 1810 05/29/19 2014 05/30/19 0001 05/30/19 0445 05/30/19 1654 05/31/19 0209  NA 142   < > 135 139 139 137 137  K 5.2*   < > 5.2* 5.4* 4.5 4.3 3.7  CL 99  --   --  99 100 99 101  CO2 25  --   --  24 25 25 22   GLUCOSE 75  --   --  96 64* 77 94  BUN 39*  --   --  43* 28* 32* 40*  CREATININE 10.79*  --   --  10.18* 7.54* 8.89* 9.38*  CALCIUM 9.0  --   --  8.9 8.8* 8.6* 8.3*  MG  --   --   --   --  2.2  --   --   PHOS  --   --   --   --  5.8*  --   --    < > = values in this interval not displayed.   GFR: Estimated Creatinine Clearance: 8.3 mL/min (A) (by C-G formula based on SCr of 9.38 mg/dL (H)). Recent Labs  Lab 05/29/19 1628 05/29/19 2002 05/30/19 0445  WBC 6.9  --  4.6  LATICACIDVEN 3.3* 1.9  --     Liver Function Tests: Recent Labs  Lab 05/29/19 1628  AST 22  ALT 11  ALKPHOS 34*  BILITOT 0.9  PROT 5.8*  ALBUMIN 3.1*   No results for input(s): LIPASE, AMYLASE in the last 168 hours. No results for input(s): AMMONIA in the last 168 hours.  ABG    Component Value Date/Time   PHART 7.418 05/30/2019 0418   PCO2ART 38.1 05/30/2019 0418   PO2ART 194 (H) 05/30/2019 0418   HCO3 24.4 05/30/2019 0418   TCO2 25 05/29/2019 2014   ACIDBASEDEF 2.0 05/29/2019 2014   O2SAT 99.4 05/30/2019 0418     Coagulation Profile: Recent Labs  Lab 05/29/19 1628 05/30/19 0445  INR 1.4* 1.3*    Cardiac  Enzymes: No results for input(s): CKTOTAL, CKMB, CKMBINDEX, TROPONINI in the last 168 hours.  HbA1C: No results found for: HGBA1C  CBG: Recent Labs  Lab 05/30/19 1942 05/30/19 2008 05/30/19 2352 05/31/19 0019 05/31/19 0334  GLUCAP 60* 102* 61* 106*  [place], MD IMTS, PGY-1 Pager: [phone]

## 2019-05-31 NOTE — Progress Notes (Signed)
[facility] Progress Note  Subjective: seen in ICU, awake and alert, sp cooling   Vitals:   05/31/19 0940 05/31/19 1000 05/31/19 1015 05/31/19 1030  BP: 107/70     Pulse: 77 77 73 (!) 57  Resp:      Temp:      TempSrc:      SpO2:      Weight:      Height:        Exam: General: adult male on vent, awake and follows simple commands HEENT: NCAT ET tube in place Neck: trachea midline  Heart: S1S2 no rub  113/79 blood pressure on my exam  Lungs: coarse mechanical breath sounds  Abdomen: softly distended/obese habitus dec bowel sounds Extremities: 1+ edema bilateral lower extremities Skin: no rash on extremities exposed Access: RIJ tunneled catheter    NW MWF 4.5h  400/800   79kg  2/2 bath  TDC    Hep 4000 - no esa active - last Hb 13.9 on 1/22 - parsabiv 2.5 mg 3 x/week - hectorol 2 mcg IV each tx  CXR 1/27 - vasc congestion   Assessment/Plan:  # Cardiac arrest  - supportive care per critical care - awake this am, on vent and follows simple commands - patient is on hypothermia protocol   # ESRD via TDC - gets MWF HD  - plan is for ICU HD today  # Acute hypoxic respiratory failure  - on vent per critical care    - optimize volume status, up 3kg by wts  # Nonischemic cardiomyopathy  - optimize volume with dialysis    # Secondary hyperpara  - Can resume hectorol   # HTN  - bp's soft,  currently off pressors      Michael [name] 05/31/2019, 10:34 AM  Inpatient medications: . acyclovir  400 mg Oral Q lunch  . atorvastatin  40 mg Per Tube QHS  . chlorhexidine gluconate (MEDLINE KIT)  15 mL Mouth Rinse BID  . Chlorhexidine Gluconate Cloth  6 each Topical Q0600  . Chlorhexidine Gluconate Cloth  6 each Topical Q0600  . dorzolamide-timolol  1 drop Both Eyes BID  . doxercalciferol      . doxercalciferol  2 mcg Intravenous Q M,W,F-HD  . feeding supplement (PRO-STAT SUGAR FREE 64)  60 mL Per Tube BID  . feeding supplement (VITAL HIGH  PROTEIN)  1,000 mL Per Tube Q24H  . heparin      . heparin  5,000 Units Subcutaneous Q8H  .  HYDROmorphone (DILAUDID) injection  0.5 mg Intravenous Once  . mouth rinse  15 mL Mouth Rinse 10 times per day  . pantoprazole sodium  40 mg Per Tube Daily  . prednisoLONE acetate  1 drop Right Eye Q1200  . sevelamer carbonate  1,600 mg Per Tube TID WC  . sodium chloride flush  10-40 mL Intracatheter Q12H   . sodium chloride    . sodium chloride    . HYDROmorphone Stopped (05/31/19 0738)  . norepinephrine (LEVOPHED) Adult infusion Stopped (05/31/19 0738)  . propofol (DIPRIVAN) infusion Stopped (05/31/19 0703)   sodium chloride, sodium chloride, albuterol, alteplase, bisacodyl, heparin, heparin, HYDROmorphone, lidocaine (PF), lidocaine-prilocaine, ondansetron (ZOFRAN) IV, pentafluoroprop-tetrafluoroeth, sennosides, sodium chloride flush Recent Labs  Lab 05/29/19 0817 05/29/19 0817 05/29/19 1146 05/29/19 1300 05/29/19 1628 05/29/19 1810 05/29/19 2014 05/30/19 0001 05/30/19 0445 05/30/19 1654 05/31/19 0209  NA 137   < > 141   < > 142 136 135 139 139 137 137  K 4.0   < >  5.4*   < > 5.2* 5.3* 5.2* 5.4* 4.5 4.3 3.7  CL 101  --  102  --  99  --   --  99 100 99 101  CO2  --   --  22  --  25  --   --  24 25 25 22   GLUCOSE 81  --  156*  --  75  --   --  96 64* 77 94  BUN 34*  --  35*  --  39*  --   --  43* 28* 32* 40*  CREATININE 10.50*  --  10.00*  --  10.79*  --   --  10.18* 7.54* 8.89* 9.38*  CALCIUM  --   --  8.8*  --  9.0  --   --  8.9 8.8* 8.6* 8.3*  PHOS  --   --   --   --   --   --   --   --  5.8*  --   --    < > = values in this interval not displayed.

## 2019-06-01 DIAGNOSIS — J9601 Acute respiratory failure with hypoxia: Secondary | ICD-10-CM

## 2019-06-01 LAB — GLUCOSE, CAPILLARY
Glucose-Capillary: 73 mg/dL (ref 70–99)
Glucose-Capillary: 79 mg/dL (ref 70–99)
Glucose-Capillary: 81 mg/dL (ref 70–99)
Glucose-Capillary: 83 mg/dL (ref 70–99)
Glucose-Capillary: 85 mg/dL (ref 70–99)
Glucose-Capillary: 92 mg/dL (ref 70–99)

## 2019-06-01 LAB — POCT I-STAT 7, (LYTES, BLD GAS, ICA,H+H)
Acid-Base Excess: 3 mmol/L — ABNORMAL HIGH (ref 0.0–2.0)
Bicarbonate: 27.8 mmol/L (ref 20.0–28.0)
Calcium, Ion: 1.11 mmol/L — ABNORMAL LOW (ref 1.15–1.40)
HCT: 47 % (ref 39.0–52.0)
Hemoglobin: 16 g/dL (ref 13.0–17.0)
O2 Saturation: 100 %
Patient temperature: 37.3
Potassium: 4.8 mmol/L (ref 3.5–5.1)
Sodium: 135 mmol/L (ref 135–145)
TCO2: 29 mmol/L (ref 22–32)
pCO2 arterial: 43 mmHg (ref 32.0–48.0)
pH, Arterial: 7.42 (ref 7.350–7.450)
pO2, Arterial: 196 mmHg — ABNORMAL HIGH (ref 83.0–108.0)

## 2019-06-01 LAB — CBC
HCT: 47 % (ref 39.0–52.0)
Hemoglobin: 14.5 g/dL (ref 13.0–17.0)
MCH: 25.3 pg — ABNORMAL LOW (ref 26.0–34.0)
MCHC: 30.9 g/dL (ref 30.0–36.0)
MCV: 81.9 fL (ref 80.0–100.0)
Platelets: 170 10*3/uL (ref 150–400)
RBC: 5.74 MIL/uL (ref 4.22–5.81)
RDW: 20.2 % — ABNORMAL HIGH (ref 11.5–15.5)
WBC: 6.1 10*3/uL (ref 4.0–10.5)
nRBC: 0 % (ref 0.0–0.2)

## 2019-06-01 LAB — COMPREHENSIVE METABOLIC PANEL
ALT: 6 U/L (ref 0–44)
AST: 38 U/L (ref 15–41)
Albumin: 2.8 g/dL — ABNORMAL LOW (ref 3.5–5.0)
Alkaline Phosphatase: 50 U/L (ref 38–126)
Anion gap: 17 — ABNORMAL HIGH (ref 5–15)
BUN: 35 mg/dL — ABNORMAL HIGH (ref 8–23)
CO2: 25 mmol/L (ref 22–32)
Calcium: 9.1 mg/dL (ref 8.9–10.3)
Chloride: 97 mmol/L — ABNORMAL LOW (ref 98–111)
Creatinine, Ser: 8.09 mg/dL — ABNORMAL HIGH (ref 0.61–1.24)
GFR calc Af Amer: 7 mL/min — ABNORMAL LOW (ref >60)
GFR calc non Af Amer: 6 mL/min — ABNORMAL LOW (ref >60)
Glucose, Bld: 64 mg/dL — ABNORMAL LOW (ref 70–99)
Potassium: 4.9 mmol/L (ref 3.5–5.1)
Sodium: 139 mmol/L (ref 135–145)
Total Bilirubin: 1.5 mg/dL — ABNORMAL HIGH (ref 0.3–1.2)
Total Protein: 6 g/dL — ABNORMAL LOW (ref 6.5–8.1)

## 2019-06-01 LAB — TRIGLYCERIDES: Triglycerides: 105 mg/dL (ref <150)

## 2019-06-01 MED ORDER — SENNA 8.6 MG PO TABS: 1.0000 | ORAL_TABLET | Freq: Two times a day (BID) | ORAL | Status: DC | PRN

## 2019-06-01 MED ORDER — ORAL CARE MOUTH RINSE
15.0000 mL | Freq: Two times a day (BID) | OROMUCOSAL | Status: DC
  Administered 2019-06-01 – 2019-06-04 (×3): 15 mL via OROMUCOSAL

## 2019-06-01 MED ORDER — ATORVASTATIN CALCIUM 40 MG PO TABS
40.0000 mg | ORAL_TABLET | Freq: Every day | ORAL | Status: DC
  Administered 2019-06-01 – 2019-06-03 (×3): 40 mg via ORAL
  Filled 2019-06-01 (×3): qty 1

## 2019-06-01 MED ORDER — LORAZEPAM 2 MG/ML IJ SOLN
INTRAMUSCULAR | Status: AC
  Filled 2019-06-01: qty 1

## 2019-06-01 MED ORDER — ACETAMINOPHEN 325 MG PO TABS: 650.0000 mg | ORAL_TABLET | Freq: Four times a day (QID) | ORAL | Status: DC | PRN

## 2019-06-01 MED ORDER — LORAZEPAM 2 MG/ML IJ SOLN
1.0000 mg | INTRAMUSCULAR | Status: DC | PRN
  Administered 2019-06-01 – 2019-06-02 (×2): 1 mg via INTRAVENOUS
  Filled 2019-06-01: qty 1

## 2019-06-01 MED ORDER — ACETAMINOPHEN 160 MG/5ML PO SOLN
650.0000 mg | ORAL | Status: DC | PRN
  Administered 2019-06-01: 650 mg
  Filled 2019-06-01: qty 20.3

## 2019-06-01 MED ORDER — SEVELAMER CARBONATE 800 MG PO TABS
1600.0000 mg | ORAL_TABLET | Freq: Three times a day (TID) | ORAL | Status: DC
  Administered 2019-06-01 – 2019-06-04 (×6): 1600 mg via ORAL
  Filled 2019-06-01 (×7): qty 2

## 2019-06-01 MED ORDER — PANTOPRAZOLE SODIUM 40 MG PO TBEC
40.0000 mg | DELAYED_RELEASE_TABLET | Freq: Every day | ORAL | Status: DC
  Administered 2019-06-01 – 2019-06-04 (×4): 40 mg via ORAL
  Filled 2019-06-01 (×4): qty 1

## 2019-06-01 NOTE — Plan of Care (Signed)
  Problem: Education: Goal: Knowledge of General Education information will improve Description: Including pain rating scale, medication(s)/side effects and non-pharmacologic comfort measures Outcome: Progressing   Problem: Health Behavior/Discharge Planning: Goal: Ability to manage health-related needs will improve Outcome: Progressing   Problem: Clinical Measurements: Goal: Respiratory complications will improve Outcome: Progressing Goal: Cardiovascular complication will be avoided Outcome: Progressing   Problem: Nutrition: Goal: Adequate nutrition will be maintained Outcome: Progressing   Problem: Skin Integrity: Goal: Risk for impaired skin integrity will decrease Outcome: Progressing   

## 2019-06-01 NOTE — Progress Notes (Signed)
[facility] Progress Note  Subjective: extubated yest , HD yest w/ 2L off. BP's low normal today, pt awakens easily a bit drowsy, O x3  Vitals:   06/01/19 0752 06/01/19 0800 06/01/19 0900 06/01/19 1000  BP: (!) 110/59 110/66 108/68 117/73  Pulse:  79  85  Resp: 19 19 (!) 6 (!) 8  Temp:  97.9 F (36.6 C)    TempSrc:  Esophageal    SpO2:  100%  100%  Weight:      Height:        Exam: General: adult male groggy but awakens easily, O x3  Heart: S1S2 no rub Lungs: CTA bilat Abdomen: softly distended/obese habitus dec bowel sounds Extremities: 1+ edema bilateral lower extremities Skin: no rash on extremities exposed Access: RIJ tunneled catheter   Home meds:  - aspirin 81/ atorvastatin 40 hs/ sl ntg prn  - eyedrops  - acyclovir 400 qd/ renvela ac tid/ prn hydrocodone   NW MWF 4.5h  400/800   79kg  2/2 bath  TDC    Hep 4000 - no esa active - last Hb 13.9 on 1/22 - parsabiv 2.5 mg 3 x/week - hectorol 2 mcg IV each tx  CXR 1/27 - vasc congestion   Assessment/Plan:  # Cardiac arrest  - sp cooling protocol - extubated now and awake and Ox 3  # ESRD via TDC - gets MWF HD  - next HD monday  # Acute hypoxic respiratory failure  - on vent per critical care    - euvolemic, at dry wt  # Nonischemic cardiomyopathy  - no issues today  # Secondary hyperpara  - Can resume hectorol   # HTN  - bp's lown normal     Rob Shaquasha Gerstel 06/01/2019, 10:19 AM  Inpatient medications: . acyclovir  400 mg Oral Q lunch  . atorvastatin  40 mg Per Tube QHS  . chlorhexidine gluconate (MEDLINE KIT)  15 mL Mouth Rinse BID  . Chlorhexidine Gluconate Cloth  6 each Topical Q0600  . Chlorhexidine Gluconate Cloth  6 each Topical Q0600  . dorzolamide-timolol  1 drop Both Eyes BID  . doxercalciferol  2 mcg Intravenous Q M,W,F-HD  . feeding supplement (PRO-STAT SUGAR FREE 64)  60 mL Per Tube BID  . feeding supplement (VITAL HIGH PROTEIN)  1,000 mL Per Tube Q24H  .  heparin  5,000 Units Subcutaneous Q8H  . mouth rinse  15 mL Mouth Rinse 10 times per day  . pantoprazole sodium  40 mg Per Tube Daily  . prednisoLONE acetate  1 drop Right Eye Q1200  . sevelamer carbonate  1,600 mg Per Tube TID WC  . sodium chloride flush  10-40 mL Intracatheter Q12H   . sodium chloride    . sodium chloride     sodium chloride, sodium chloride, acetaminophen (TYLENOL) oral liquid 160 mg/5 mL, albuterol, alteplase, bisacodyl, heparin, heparin, lidocaine (PF), lidocaine-prilocaine, pentafluoroprop-tetrafluoroeth, sennosides, sodium chloride flush Recent Labs  Lab 05/29/19 1146 05/29/19 1300 05/29/19 1628 05/29/19 1810 05/29/19 2014 05/30/19 0001 05/30/19 0445 05/30/19 1654 05/31/19 0209 06/01/19 0338 06/01/19 0418  NA 141   < > 142   < > 135 139 139 137 137 135 139  K 5.4*   < > 5.2*   < > 5.2* 5.4* 4.5 4.3 3.7 4.8 4.9  CL 102  --  99  --   --  99 100 99 101  --  97*  CO2 22  --  25  --   --  _0 --  25  GLUCOSE 156*  --  75  --   --  96 64* 77 94  --  64*  BUN 35*  --  39*  --   --  43* 28* 32* 40*  --  35*  CREATININE 10.00*  --  10.79*  --   --  10.18* 7.54* 8.89* 9.38*  --  8.09*  CALCIUM 8.8*  --  9.0  --   --  8.9 8.8* 8.6* 8.3*  --  9.1  PHOS  --   --   --   --   --   --  5.8*  --   --   --   --    < > = values in this interval not displayed.

## 2019-06-01 NOTE — Progress Notes (Signed)
eLink Physician-Brief Progress Note Patient Name: Michael [name]. DOB: June 06, 1955 [name]: [id number]   Date of Service  06/01/2019  HPI/Events of Note  Patient is agitated and requesting to leave the hospital.  Pt had cardiac arrest and completed hypothermia protocol.   QTc on EKG 530.  Pt is sitting up in bed. He is awake and alert.  eICU Interventions  Give ativan now.     Intervention Category Intermediate Interventions: Other:  Michael [name] 06/01/2019, 9:24 PM

## 2019-06-01 NOTE — Progress Notes (Signed)
NAME:  Michael Bensinger., [name]:  [id number], DOB:  [birth year], LOS: 3 ADMISSION DATE:  05/29/2019, CONSULTATION DATE:  05/28/2018 REFERRING MD:  Wilmer Floor - Vascular surgery, CHIEF COMPLAINT:  Cardiac Arrest   Brief History   64 y/o male with a PMH of ESRD, HLD, HTN, OSA, V. Tach, and Hyperparathyroidism, presents s/p pre operative cardiac arrest.   Past Medical History   Past Medical History:  Diagnosis Date  . Atrial arrhythmia    atrial tachycardia with variable AV conduction versus atypical aflutter 01/10/19, rate control (02/06/19)  . Cataract   . Dyspnea    on exertion  . ESRD (end stage renal disease) ([place])    Hemo- MWF, Polycystic kidney disease  . Fatigue   . History of kidney stones    removal of stone- cysto  . Hyperlipidemia   . Hyperparathyroidism, secondary renal ([place])   . Hypertension   . Hypoxemia 12/12/2013  . Nonischemic cardiomyopathy ([place])    Er 25% 2015, 55 % 2013  . OSA on CPAP    no longer using cpap  . OSA on CPAP 03/24/2014  . Pneumonia    2015ish  . Ventricular tachycardia//Freq PVCs   . Wears glasses    Significant Hospital Events   1/27: Cardiac Arrest pre op.  1/28: O/N Troponin elevation. Rewarm to 37C discontinued sedation 1/29: Tracks to voice, able to follow some commands. Will continue CRRT, attempt wean, and if successful, extubate.    Consults:  PCCM Nephrology  Procedures:  Aborted right fistula revision   Significant Diagnostic Tests:  ECG 1/27 post arrest: NSR with LAD, non-specific IVCD with possible LVH by voltage criteria. No change from prior.  CT Head 1/27: Generalized cerebral volume loss since 2010 but otherwise noncon CT appearance of the brain is within normal limits.   EEG 1/27: Suggestive of profound diffuse encephalopathy, non specific to etiology. No seizures or epileptiform discharges were seen throughout the recording.  Micro Data:  N/A  Antimicrobials:  N/A   Interim history/subjective:  Tolerating SBT this  morning  Objective   Blood pressure 110/66, pulse 79, temperature 97.9 F (36.6 C), temperature source Esophageal, resp. rate 19, height 5\' 6"  (1.676 m), weight 79.4 kg, SpO2 100 %.    Vent Mode: CPAP;PSV FiO2 (%):  [40 %] 40 % Set Rate:  [28 bmp] 28 bmp Vt Set:  [500 mL] 500 mL PEEP:  [5 cmH20] 5 cmH20 Pressure Support:  [5 cmH20-10 cmH20] 5 cmH20 Plateau Pressure:  [22 cmH20-26 cmH20] 26 cmH20   Intake/Output Summary (Last 24 hours) at 06/01/2019 2979 Last data filed at 06/01/2019 0545 Gross per 24 hour  Intake 221.69 ml  Output 3150 ml  Net -2928.31 ml   Filed Weights   05/31/19 0500 05/31/19 1310 06/01/19 0500  Weight: 82.2 kg 80 kg 79.4 kg    Examination: General: Chronically ill-appearing man, no distress on MV HENT: ET tube in place, pupils equal, no icterus Lungs: Coarse bilateral breath sounds, no wheezes Cardiovascular: Regular, no murmur Abdomen: Nondistended, positive bowel sounds Extremities: 1+ bilateral lower extremity edema Neuro: Awake, nods to questions, follows commands, strong cough, no focal deficits Skin: Warm and dry, no rash  Resolved Hospital Problem list   Cardiac Arrest:   Assessment & Plan:  Cardiac Arrest:  Underlying etiology currently unclear, but likely due to primary respiratory failure due to sedative use before his operation for AV fistula correction.  Completed TTM, now at [facility] Supportive care  Acute Hypoxic Respiratory Failure:  Requires  full ventilation support at this time.  Tolerating SBT this morning, meets criteria for extubation and will plan to proceed. Push pulmonary hygiene Okay to advance to oral intake over the next several hours if he passes a bedside swallowing evaluation  Non Ischemic Cardiomyopathy with biventricular Dysfunction:  Following telemetry His home Lipitor, aspirin are on hold.  Plan to restart when he is taking oral meds  ESRD V:   MWF HD Sessions Appreciate nephrology management, successfully  completed IHD on 1/29 with 2 L removed Plan for MWF schedule Continue sevelamer   Daily Goals Checklist   DVT prophylaxis: Heparin tid Nutrition Status: Assess for swallowing, initiation of diet GI prophylaxis: Protonix, likely transition to off if able to to start a diet Mobility/therapy: bedrest. Code Status: Full code Disposition: ICU, may be able to transition out of the ICU 1/30 afternoon if doing well Family: I updated his wife [name] by phone 1/30  Labs   CBC: Recent Labs  Lab 05/29/19 1628 05/29/19 1810 05/29/19 2014 05/30/19 0445 05/31/19 0639 06/01/19 0338 06/01/19 0500  WBC 6.9  --   --  4.6 4.8  --  6.1  NEUTROABS 5.8  --   --   --   --   --   --   HGB 14.4   < > 16.0 14.2 13.5 16.0 14.5  HCT 48.9   < > 47.0 46.0 42.9 47.0 47.0  MCV 86.4  --   --  82.7 80.8  --  81.9  PLT 179  --   --  163 150  --  170   < > = values in this interval not displayed.    Basic Metabolic Panel: Recent Labs  Lab 05/30/19 0001 05/30/19 0001 05/30/19 0445 05/30/19 1654 05/31/19 0209 06/01/19 0338 06/01/19 0418  NA 139   < > 139 137 137 135 139  K 5.4*   < > 4.5 4.3 3.7 4.8 4.9  CL 99  --  100 99 101  --  97*  CO2 24  --  25 25 22   --  25  GLUCOSE 96  --  64* 77 94  --  64*  BUN 43*  --  28* 32* 40*  --  35*  CREATININE 10.18*  --  7.54* 8.89* 9.38*  --  8.09*  CALCIUM 8.9  --  8.8* 8.6* 8.3*  --  9.1  MG  --   --  2.2  --   --   --   --   PHOS  --   --  5.8*  --   --   --   --    < > = values in this interval not displayed.   GFR: Estimated Creatinine Clearance: 9.5 mL/min (A) (by C-G formula based on SCr of 8.09 mg/dL (H)). Recent Labs  Lab 05/29/19 1628 05/29/19 2002 05/30/19 0445 05/31/19 0639 06/01/19 0500  WBC 6.9  --  4.6 4.8 6.1  LATICACIDVEN 3.3* 1.9  --   --   --     Liver Function Tests: Recent Labs  Lab 05/29/19 1628 06/01/19 0418  AST 22 38  ALT 11 6  ALKPHOS 34* 50  BILITOT 0.9 1.5*  PROT 5.8* 6.0*  ALBUMIN 3.1* 2.8*   No results for  input(s): LIPASE, AMYLASE in the last 168 hours. No results for input(s): AMMONIA in the last 168 hours.  ABG    Component Value Date/Time   PHART 7.420 06/01/2019 0338   PCO2ART 43.0 06/01/2019 0338  PO2ART 196.0 (H) 06/01/2019 0338   HCO3 27.8 06/01/2019 0338   TCO2 29 06/01/2019 0338   ACIDBASEDEF 2.0 05/29/2019 2014   O2SAT 100.0 06/01/2019 0338     Coagulation Profile: Recent Labs  Lab 05/29/19 1628 05/30/19 0445  INR 1.4* 1.3*    Cardiac Enzymes: No results for input(s): CKTOTAL, CKMB, CKMBINDEX, TROPONINI in the last 168 hours.  HbA1C: No results found for: HGBA1C  CBG: Recent Labs  Lab 05/31/19 2017 06/01/19 0021 06/01/19 0416 06/01/19 0657 06/01/19 0809  GLUCAP 82 83 73 79 81   Independent critical care time 32 minutes  Baltazar Apo, MD, PhD 06/01/2019, 8:43 AM [facility] [phone] or if no answer [phone]

## 2019-06-01 NOTE — Evaluation (Signed)
Physical Therapy Evaluation Patient Details Name: Michael [name]. [name]: [id number] DOB: 07-Aug-1955 Today's Date: 06/01/2019   History of Present Illness  64 y/o male with a PMH of ESRD, HLD, HTN, OSA, V. Tach, and Hyperparathyroidism, presents s/p pre operative (R AV fistula) cardiac arrest.  Clinical Impression  Pt presents to PT with deficits in cognition, gait, balance, endurance, functional mobility, strength, and power. Pt with unsteady gait, experiencing multiple LOB requiring PT physical assistance to correct. Pt impulsive and requiring PT cues to stop initiation of premature mobility. Pt will benefit from acute PT services to improve stability, reduce falls risk, and aide in a complete return to independent mobility.    Follow Up Recommendations Home health PT;Supervision/Assistance - 24 hour    Equipment Recommendations  Rolling walker with 5" wheels    Recommendations for Other Services       Precautions / Restrictions Precautions Precautions: Fall Restrictions Weight Bearing Restrictions: No      Mobility  Bed Mobility Overal bed mobility: Needs Assistance Bed Mobility: Supine to Sit     Supine to sit: Supervision;HOB elevated        Transfers Overall transfer level: Needs assistance Equipment used: Rolling walker (2 wheeled) Transfers: Sit to/from Stand Sit to Stand: Min guard            Ambulation/Gait Ambulation/Gait assistance: Min assist;Mod assist(min-modA) Gait Distance (Feet): 50 Feet Assistive device: Rolling walker (2 wheeled) Gait Pattern/deviations: Step-to pattern;Drifts right/left;Trunk flexed Gait velocity: reduced Gait velocity interpretation: <1.8 ft/sec, indicate of risk for recurrent falls General Gait Details: pt with increased trunk flexion, 2 LOB toward R side, unsteady gait with increased sway  Stairs            Wheelchair Mobility    Modified Rankin (Stroke Patients Only)       Balance Overall balance  assessment: Needs assistance Sitting-balance support: No upper extremity supported;Feet supported Sitting balance-Leahy Scale: Good Sitting balance - Comments: close supervision   Standing balance support: Bilateral upper extremity supported Standing balance-Leahy Scale: Fair Standing balance comment: minG-minA with BUE support of RW                             Pertinent Vitals/Pain Pain Assessment: No/denies pain    Home Living Family/patient expects to be discharged to:: Private residence Living Arrangements: Spouse/significant other;Children Available Help at Discharge: Family;Available 24 hours/day Type of Home: House Home Access: Stairs to enter Entrance Stairs-Rails: Psychiatric nurse of Steps: 4 Home Layout: One level Home Equipment: Cane - single point      Prior Function Level of Independence: Independent         Comments: retired, former Customer service manager, Psychiatrist        Extremity/Trunk Assessment   Upper Extremity Assessment Upper Extremity Assessment: Generalized weakness    Lower Extremity Assessment Lower Extremity Assessment: Generalized weakness    Cervical / Trunk Assessment Cervical / Trunk Assessment: Normal  Communication   Communication: No difficulties  Cognition Arousal/Alertness: Awake/alert Behavior During Therapy: Impulsive Overall Cognitive Status: Impaired/Different from baseline Area of Impairment: Orientation;Safety/judgement;Problem solving                 Orientation Level: Disoriented to;Place;Time       Safety/Judgement: Decreased awareness of safety;Decreased awareness of deficits   Problem Solving: Slow processing;Requires verbal cues        General Comments General comments (skin integrity, edema,  etc.): VSS on RA    Exercises     Assessment/Plan    PT Assessment Patient needs continued PT services  PT Problem List Decreased strength;Decreased  activity tolerance;Decreased balance;Decreased mobility;Decreased cognition;Decreased knowledge of use of DME;Decreased safety awareness;Decreased knowledge of precautions;Cardiopulmonary status limiting activity       PT Treatment Interventions DME instruction;Gait training;Stair training;Functional mobility training;Therapeutic activities;Therapeutic exercise;Balance training;Neuromuscular re-education;Patient/family education    PT Goals (Current goals can be found in the Care Plan section)  Acute Rehab PT Goals Patient Stated Goal: To return to independent mobility PT Goal Formulation: With patient Time For Goal Achievement: 06/15/19 Potential to Achieve Goals: Good    Frequency Min 3X/week   Barriers to discharge        Co-evaluation               AM-PAC PT "6 Clicks" Mobility  Outcome Measure Help needed turning from your back to your side while in a flat bed without using bedrails?: None Help needed moving from lying on your back to sitting on the side of a flat bed without using bedrails?: None Help needed moving to and from a bed to a chair (including a wheelchair)?: A Little Help needed standing up from a chair using your arms (e.g., wheelchair or bedside chair)?: A Little Help needed to walk in hospital room?: A Lot Help needed climbing 3-5 steps with a railing? : Total 6 Click Score: 17    End of Session Equipment Utilized During Treatment: (none) Activity Tolerance: Patient tolerated treatment well Patient left: in chair;with call bell/phone within reach;with family/visitor present Nurse Communication: Mobility status PT Visit Diagnosis: Unsteadiness on feet (R26.81)    Time: 4854-6270 PT Time Calculation (min) (ACUTE ONLY): 30 min   Charges:   PT Evaluation $PT Eval Moderate Complexity: 1 Mod PT Treatments $Gait Training: 8-22 mins        Zenaida Niece, PT, DPT Acute Rehabilitation Pager: [phone]   Zenaida Niece 06/01/2019, 5:25 PM

## 2019-06-01 NOTE — Procedures (Signed)
Extubation Procedure Note  Patient Details:   Name: Michael [name]. DOB: 1956-03-17 [name]: [id number]   Airway Documentation:    Vent end date: 06/01/19 Vent end time: 0830   Evaluation  O2 sats: stable throughout Complications: No apparent complications Patient did tolerate procedure well. Bilateral Breath Sounds: Clear   Yes   Patient extubated to [place]. Vital signs stable at this time. No complications. RT will continue to monitor.  Mcneil Sober 06/01/2019, 8:49 AM

## 2019-06-01 NOTE — Plan of Care (Signed)
    Problem: Clinical Measurements: Goal: Ability to maintain clinical measurements within normal limits will improve Outcome: Progressing Goal: Will remain free from infection Outcome: Progressing Goal: Respiratory complications will improve Outcome: Progressing Goal: Cardiovascular complication will be avoided Outcome: Progressing   Problem: Activity: Goal: Risk for activity intolerance will decrease Outcome: Progressing   Problem: Pain Managment: Goal: General experience of comfort will improve Outcome: Progressing   Problem: Safety: Goal: Ability to remain free from injury will improve Outcome: Progressing   Problem: Skin Integrity: Goal: Risk for impaired skin integrity will decrease Outcome: Progressing

## 2019-06-01 NOTE — Progress Notes (Signed)
eLink Physician-Brief Progress Note Patient Name: Michael [name]. DOB: 1955-06-16 [name]: [id number]   Date of Service  06/01/2019  HPI/Events of Note  Request to shift acetaminophen per suppository to per OGT  eICU Interventions  Order entered     Intervention Category Minor Interventions: Routine modifications to care plan (e.g. PRN medications for pain, fever)  Shona Needles Aarvi Stotts 06/01/2019, 5:13 AM

## 2019-06-02 ENCOUNTER — Inpatient Hospital Stay (HOSPITAL_COMMUNITY): Payer: Medicare Other

## 2019-06-02 DIAGNOSIS — Z9889 Other specified postprocedural states: Secondary | ICD-10-CM

## 2019-06-02 LAB — RENAL FUNCTION PANEL
Albumin: 2.6 g/dL — ABNORMAL LOW (ref 3.5–5.0)
Anion gap: 15 (ref 5–15)
BUN: 44 mg/dL — ABNORMAL HIGH (ref 8–23)
CO2: 26 mmol/L (ref 22–32)
Calcium: 9.1 mg/dL (ref 8.9–10.3)
Chloride: 96 mmol/L — ABNORMAL LOW (ref 98–111)
Creatinine, Ser: 9.71 mg/dL — ABNORMAL HIGH (ref 0.61–1.24)
GFR calc Af Amer: 6 mL/min — ABNORMAL LOW (ref >60)
GFR calc non Af Amer: 5 mL/min — ABNORMAL LOW (ref >60)
Glucose, Bld: 90 mg/dL (ref 70–99)
Phosphorus: 10.4 mg/dL — ABNORMAL HIGH (ref 2.5–4.6)
Potassium: 4.6 mmol/L (ref 3.5–5.1)
Sodium: 137 mmol/L (ref 135–145)

## 2019-06-02 LAB — BASIC METABOLIC PANEL
Anion gap: 18 — ABNORMAL HIGH (ref 5–15)
BUN: 47 mg/dL — ABNORMAL HIGH (ref 8–23)
CO2: 25 mmol/L (ref 22–32)
Calcium: 9.1 mg/dL (ref 8.9–10.3)
Chloride: 94 mmol/L — ABNORMAL LOW (ref 98–111)
Creatinine, Ser: 10.59 mg/dL — ABNORMAL HIGH (ref 0.61–1.24)
GFR calc Af Amer: 5 mL/min — ABNORMAL LOW (ref >60)
GFR calc non Af Amer: 5 mL/min — ABNORMAL LOW (ref >60)
Glucose, Bld: 90 mg/dL (ref 70–99)
Potassium: 4.9 mmol/L (ref 3.5–5.1)
Sodium: 137 mmol/L (ref 135–145)

## 2019-06-02 LAB — MAGNESIUM: Magnesium: 2.4 mg/dL (ref 1.7–2.4)

## 2019-06-02 IMAGING — MR MR HEAD W/O CM
11 of 12 series · 44 of 48 positions shown · non-contrast
Comparison: CT [DATE]

CLINICAL DATA: Cardiac arrest with abnormal neurologic status
subsequent.

EXAM:
MRI HEAD WITHOUT CONTRAST
TECHNIQUE: Multiplanar, multiecho pulse sequences of the brain and surrounding
structures were obtained without intravenous contrast.

[Series 5: DWI · axial · 3.0mm · 0.88mm/px · z∈[-88,+44]mm · 9 of 90 slices shown (1 of 4)]
[im 1/90]
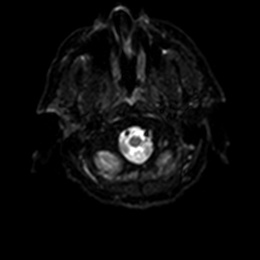
[im 12/90]
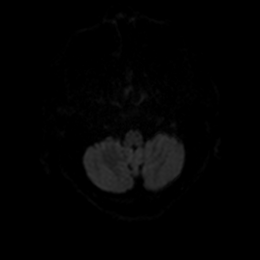
[im 23/90]
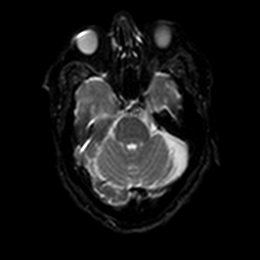
[im 34/90]
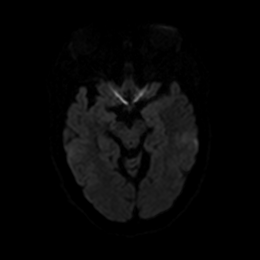
[im 45/90]
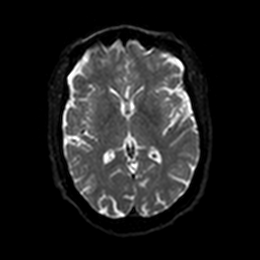
[im 56/90]
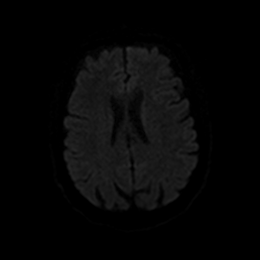
[im 67/90]
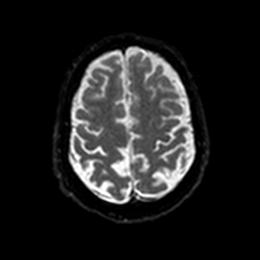
[im 78/90]
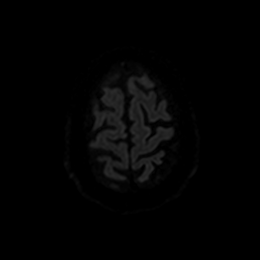
[im 90/90]
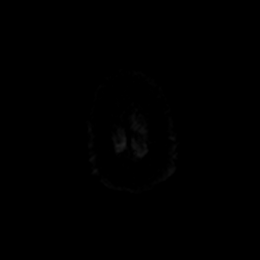

[Series 6: DWI · axial · 3.0mm · 0.88mm/px · z∈[-88,+44]mm · 4 of 45 slices shown (2 of 4)]
[im 1/45]
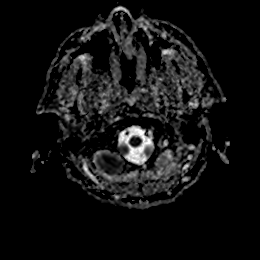
[im 15/45]
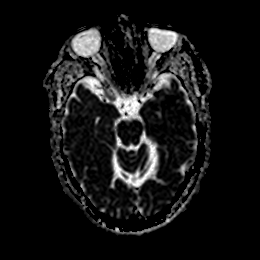
[im 30/45]
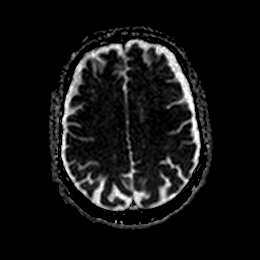
[im 45/45]
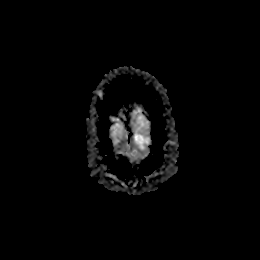

[Series 7: DWI · coronal · 4.0mm · 0.88mm/px · 6 of 64 slices shown (3 of 4)]
[im 1/64]
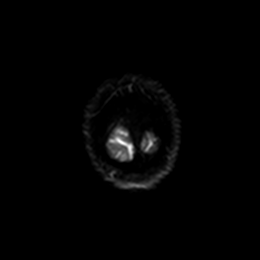
[im 13/64]
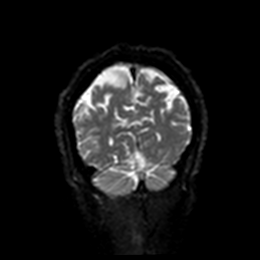
[im 26/64]
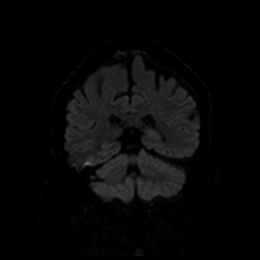
[im 38/64]
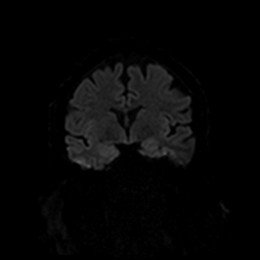
[im 51/64]
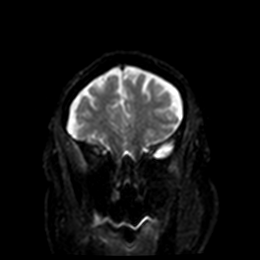
[im 64/64]
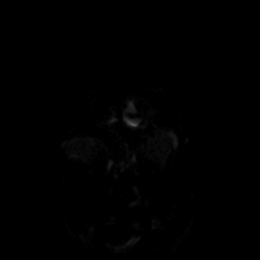

[Series 8: DWI · coronal · 4.0mm · 0.88mm/px · 3 of 31 slices shown (4 of 4)]
[im 1/31]
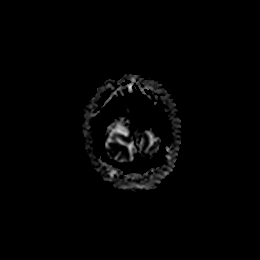
[im 16/31]
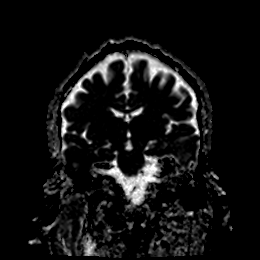
[im 31/31]
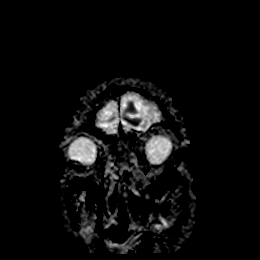

[Series 9: T1 · sagittal · 5.0mm · 0.75mm/px · 2 of 23 slices shown]
[im 1/23]
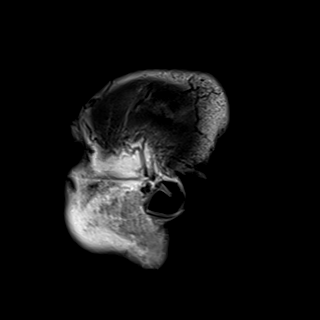
[im 23/23]
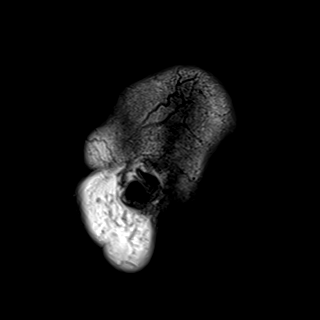

[Series 10: T2 · axial · 5.0mm · 0.72mm/px · z∈[-94,+50]mm · 2 of 25 slices shown]
[im 1/25]
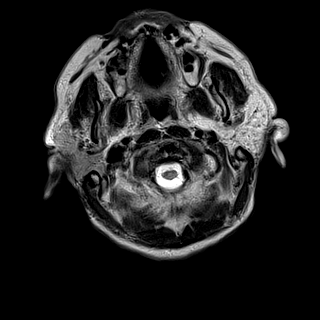
[im 25/25]
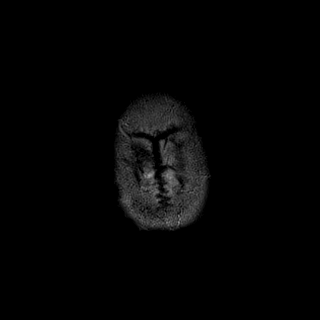

[Series 11: FLAIR · axial · 5.0mm · 0.45mm/px · z∈[-93,+51]mm · 2 of 25 slices shown]
[im 1/25]
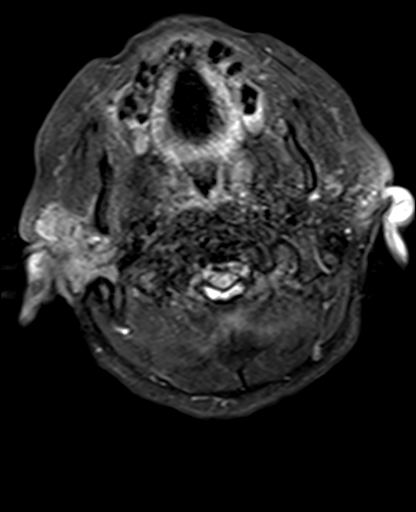
[im 25/25]
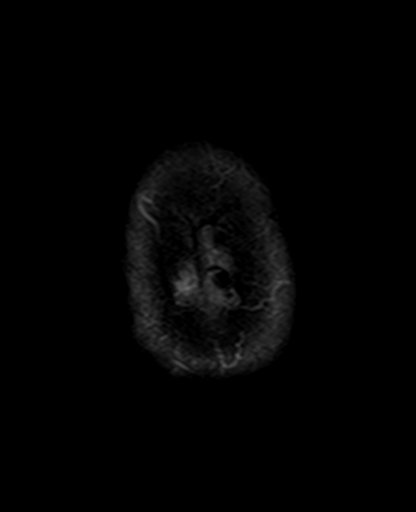

[Series 12: mag_images · axial · 3.0mm · 0.90mm/px · z∈[-98,+55]mm · 4 of 52 slices shown]
[im 1/52]
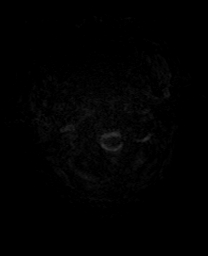
[im 18/52]
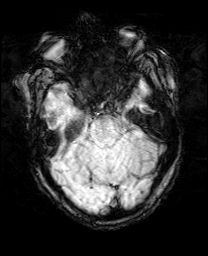
[im 35/52]
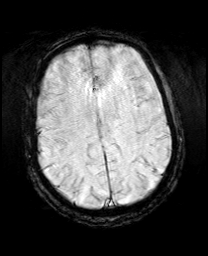
[im 52/52]
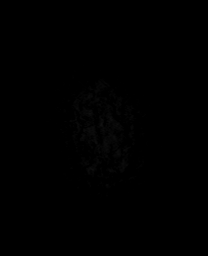

[Series 13: pha_images · axial · 3.0mm · 0.90mm/px · z∈[-98,+55]mm · 4 of 52 slices shown]
[im 1/52]
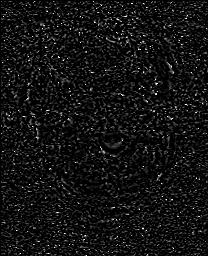
[im 18/52]
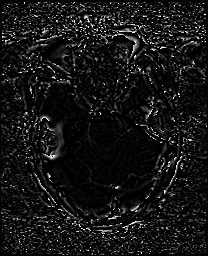
[im 35/52]
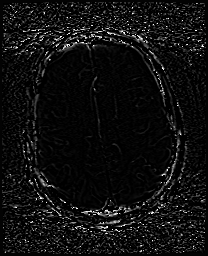
[im 52/52]
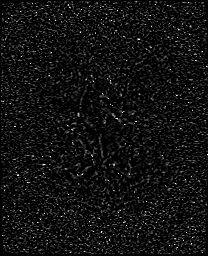

[Series 14: swi_images · axial · 3.0mm · 0.90mm/px · z∈[-98,+55]mm · 4 of 52 slices shown]
[im 1/52]
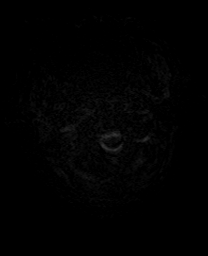
[im 18/52]
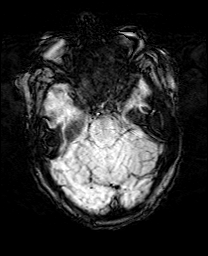
[im 35/52]
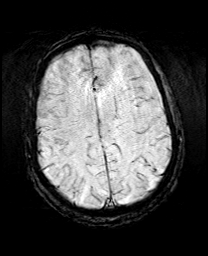
[im 52/52]
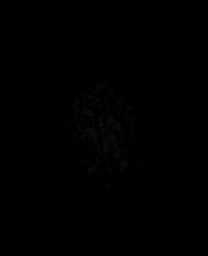

[Series 15: mip_images(sw) · axial · 24.0mm · 0.90mm/px · z∈[-87,+45]mm · 4 of 45 slices shown]
[im 1/45]
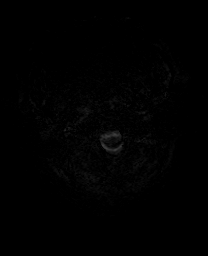
[im 15/45]
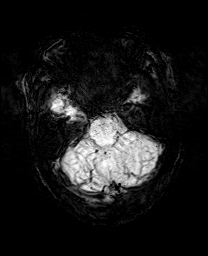
[im 30/45]
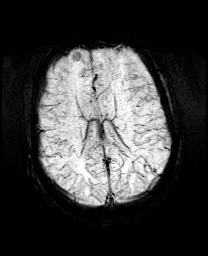
[im 45/45]
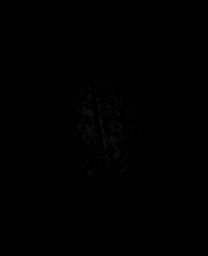

[44 of 48 positions shown; findings below may reference images not displayed]

FINDINGS: Brain: Diffusion imaging does not show any acute or subacute
infarction. No sign of anoxic brain injury. Brainstem and cerebellum
are normal. Cerebral hemispheres show mild age related atrophy
without small or large vessel infarction. No mass lesion,
hemorrhage, hydrocephalus or extra-axial collection.

Vascular: Major vessels at the base of the brain show flow.

Skull and upper cervical spine: Negative

Sinuses/Orbits: Clear/normal

Other: None
IMPRESSION: Mild cerebral hemispheric brain atrophy for age. Otherwise normal
examination. No old or acute small or large vessel infarction. No
evidence of diffuse hypoxic ischemic injury.

## 2019-06-02 MED ORDER — RENA-VITE PO TABS
1.0000 | ORAL_TABLET | Freq: Every day | ORAL | Status: DC
  Administered 2019-06-02 – 2019-06-03 (×2): 1 via ORAL
  Filled 2019-06-02 (×2): qty 1

## 2019-06-02 MED ORDER — CHLORHEXIDINE GLUCONATE CLOTH 2 % EX PADS
6.0000 | MEDICATED_PAD | Freq: Every day | CUTANEOUS | Status: DC
  Administered 2019-06-04: 6 via TOPICAL

## 2019-06-02 MED ORDER — ASPIRIN EC 81 MG PO TBEC
81.0000 mg | DELAYED_RELEASE_TABLET | Freq: Every day | ORAL | Status: DC
  Administered 2019-06-02 – 2019-06-04 (×3): 81 mg via ORAL
  Filled 2019-06-02 (×3): qty 1

## 2019-06-02 MED ORDER — HEPARIN SODIUM (PORCINE) 5000 UNIT/ML IJ SOLN
5000.0000 [IU] | Freq: Three times a day (TID) | INTRAMUSCULAR | Status: DC
  Administered 2019-06-02 – 2019-06-04 (×5): 5000 [IU] via SUBCUTANEOUS
  Filled 2019-06-02 (×5): qty 1

## 2019-06-02 MED ORDER — ATORVASTATIN CALCIUM 40 MG PO TABS: 40.0000 mg | ORAL_TABLET | Freq: Every day | ORAL | Status: DC

## 2019-06-02 MED ORDER — NEPRO/CARBSTEADY PO LIQD
237.0000 mL | Freq: Two times a day (BID) | ORAL | Status: DC
  Administered 2019-06-02 – 2019-06-04 (×3): 237 mL via ORAL

## 2019-06-02 NOTE — Progress Notes (Addendum)
Ativan given prior to MRI.  Pt has been in a deep sleep since then.  Pt has been having frequent SVTs and frequent PVCs while asleep.  Dr. Doristine Bosworth made aware.  Idolina Primer, RN

## 2019-06-02 NOTE — Progress Notes (Signed)
Pt had occasional non sustained SVTs in 140s x2 while asleep. Dr. Doristine Bosworth made aware.   Idolina Primer, RN

## 2019-06-02 NOTE — Evaluation (Signed)
Occupational Therapy Evaluation Patient Details Name: Michael [name]. [name]: [id number] DOB: 1956-03-22 Today's Date: 06/02/2019    History of Present Illness 64 y/o male with a PMH of ESRD, HLD, HTN, OSA, V. Tach, and Hyperparathyroidism, presents s/p pre operative (R AV fistula) cardiac arrest.   Clinical Impression   Pt PTA: Pt living with spouse and she reports that pt was independent. Pt currently limited by decreased arousal today, difficulty following commands and increased weakness. Pt's spouse very helpful and supportive in room as pt would follow her commands better. Pt decrease in function today since PT eval yesterday- pt modA  For bed mobility; modA +2 for use of RW and side stepping towards HOB. Pt very lethargic and requiring multimodal cues to perform tasks. Pt requiring near maxA to total A for ADL at this time. Pt would greatly benefit from continued OT skilled services for ADL, mobility and HEP. OT following acutely.  HR increasing from 70 BPM to 90-120 BPM with exertion.     Follow Up Recommendations  CIR;Supervision/Assistance - 24 hour(Unless pt improves, pt requires intensive assist)    Equipment Recommendations  Other (comment)(to be determined)    Recommendations for Other Services       Precautions / Restrictions Precautions Precautions: Fall Restrictions Weight Bearing Restrictions: No      Mobility Bed Mobility Overal bed mobility: Needs Assistance Bed Mobility: Supine to Sit     Supine to sit: Mod assist;Max assist;HOB elevated     General bed mobility comments: Assist for trunk and BLE management; pt maxA to scoot to EOB  Transfers Overall transfer level: Needs assistance Equipment used: Rolling walker (2 wheeled) Transfers: Sit to/from Stand Sit to Stand: Mod assist;+2 physical assistance;+2 safety/equipment         General transfer comment: Spouse and OTR on either side; cues for each step    Balance Overall balance assessment:  Needs assistance Sitting-balance support: No upper extremity supported;Feet supported Sitting balance-Leahy Scale: Poor Sitting balance - Comments: assist for sitting upright and cues to hold self up   Standing balance support: Bilateral upper extremity supported Standing balance-Leahy Scale: Poor Standing balance comment: Pt standing with modA+2 for stability                           ADL either performed or assessed with clinical judgement   ADL Overall ADL's : Needs assistance/impaired Eating/Feeding: Maximal assistance;Bed level   Grooming: Maximal assistance;Bed level   Upper Body Bathing: Maximal assistance;Sitting;Bed level   Lower Body Bathing: Maximal assistance;+2 for physical assistance;Sitting/lateral leans;Sit to/from stand   Upper Body Dressing : Maximal assistance;Sitting;Bed level   Lower Body Dressing: Maximal assistance;+2 for physical assistance;Sitting/lateral leans;Sit to/from stand   Toilet Transfer: Maximal assistance;+2 for physical assistance;Stand-pivot;BSC   Toileting- Clothing Manipulation and Hygiene: Maximal assistance;+2 for physical assistance;Sitting/lateral lean;Sit to/from stand;Cueing for safety;Cueing for sequencing       Functional mobility during ADLs: Moderate assistance;+2 for physical assistance;Rolling walker;Cueing for safety;Cueing for sequencing General ADL Comments: Pt limited by decreased arousal today, difficulty following commands and increased weakness. Pt's spouse very helpful and supportive in room as pt would follow her commands better.     Vision Baseline Vision/History: Wears glasses Wears Glasses: At all times Vision Assessment?: Vision impaired- to be further tested in functional context Additional Comments: Unable to arouse enough for assessment     Perception     Praxis      Pertinent Vitals/Pain Pain Assessment: No/denies  pain     Hand Dominance Right   Extremity/Trunk Assessment Upper  Extremity Assessment Upper Extremity Assessment: Generalized weakness   Lower Extremity Assessment Lower Extremity Assessment: Generalized weakness   Cervical / Trunk Assessment Cervical / Trunk Assessment: Normal   Communication Communication Communication: No difficulties   Cognition Arousal/Alertness: Lethargic;Suspect due to medications Behavior During Therapy: Impulsive Overall Cognitive Status: Impaired/Different from baseline Area of Impairment: Orientation;Following commands;Safety/judgement                 Orientation Level: Disoriented to;Place;Time     Following Commands: Follows one step commands with increased time Safety/Judgement: Decreased awareness of safety;Decreased awareness of deficits   Problem Solving: Slow processing;Requires verbal cues General Comments: Pt unable to keep eyes open for more than a few seconds   General Comments  VSS. HR increasing to 120 BPM with exertion up frm 90 BPM at rest    Exercises     Shoulder Instructions      Home Living Family/patient expects to be discharged to:: Private residence Living Arrangements: Spouse/significant other;Children Available Help at Discharge: Family;Available 24 hours/day Type of Home: House Home Access: Stairs to enter [employer] of Steps: 4 Entrance Stairs-Rails: Right;Left Home Layout: One level     Bathroom Shower/Tub: Teacher, early years/pre: Handicapped height     Home Equipment: [employer] - single point   Additional Comments: per family/chart as pt unable to answer due to lethargy      Prior Functioning/Environment Level of Independence: Independent        Comments: retired, former Customer service manager, Scientific laboratory technician Problem List: Decreased strength;Decreased activity tolerance;Impaired balance (sitting and/or standing);Decreased coordination;Decreased safety awareness;Impaired UE functional use;Decreased knowledge of use of DME or AE;Decreased  cognition;Impaired vision/perception      OT Treatment/Interventions: Self-care/ADL training;Therapeutic exercise;Neuromuscular education;Energy conservation;Therapeutic activities;Patient/family education;Balance training;Cognitive remediation/compensation;Visual/perceptual remediation/compensation    OT Goals(Current goals can be found in the care plan section) Acute Rehab OT Goals Patient Stated Goal: To return to independent mobility OT Goal Formulation: With patient/family Time For Goal Achievement: 06/16/19 Potential to Achieve Goals: Good ADL Goals Pt Will Perform Grooming: with min guard assist;standing Pt Will Perform Upper Body Dressing: with set-up;sitting Pt Will Perform Lower Body Dressing: with min guard assist;sit to/from stand Pt Will Transfer to Toilet: with min guard assist;ambulating;bedside commode Pt Will Perform Toileting - Clothing Manipulation and hygiene: with min assist;sitting/lateral leans;sit to/from stand Pt/caregiver will Perform Home Exercise Program: Increased strength;Right Upper extremity;Left upper extremity;With written HEP provided  OT Frequency: Min 2X/week   Barriers to D/C:            Co-evaluation              AM-PAC OT "6 Clicks" Michael Activity     Outcome Measure Help from another person eating meals?: A Lot Help from another person taking care of personal grooming?: A Lot Help from another person toileting, which includes using toliet, bedpan, or urinal?: A Lot Help from another person bathing (including washing, rinsing, drying)?: Total Help from another person to put on and taking off regular upper body clothing?: A Lot Help from another person to put on and taking off regular lower body clothing?: Total 6 Click Score: 10   End of Session Equipment Utilized During Treatment: Gait belt;Rolling walker Nurse Communication: Mobility status  Activity Tolerance: Patient limited by lethargy Patient left: in bed;with call bell/phone  within reach;with bed alarm set;with family/visitor present  OT Visit Diagnosis: Unsteadiness on feet (R26.81);Muscle weakness (generalized) (M62.81);Other symptoms and signs involving cognitive function                Time: 1040-1105 OT Time Calculation (min): 25 min Charges:  OT General Charges $OT Visit: 1 Visit OT Evaluation $OT Eval Moderate Complexity: 1 Mod OT Treatments $Neuromuscular Re-education: 8-22 mins  Jefferey Pica OTR/L Acute Rehabilitation Services Pager: [phone] Office: [phone]   Glendola Friedhoff C 06/02/2019, 5:24 PM

## 2019-06-02 NOTE — Plan of Care (Signed)
  Problem: Activity: Goal: Risk for activity intolerance will decrease Outcome: Progressing   Problem: Pain Managment: Goal: General experience of comfort will improve Outcome: Progressing   Problem: Safety: Goal: Ability to remain free from injury will improve Outcome: Progressing   Problem: Skin Integrity: Goal: Risk for impaired skin integrity will decrease Outcome: Progressing  Elesa Hacker, RN

## 2019-06-02 NOTE — Progress Notes (Signed)
PROGRESS NOTE    Michael [name].  MBW:[id number] DOB: 10-31-1955 DOA: 05/29/2019 PCP: Michael Dus, MD   Brief Narrative:  Michael [name], [name]. is a 64 year old gentleman who was a scheduled to have right AV fistula versus graft done by vascular surgery on 05/28/2018 however unfortunately, prior to incision he had a cardiac arrest in the operating room.  Patient became hypotensive and hypoxic and ultimately got 4 minutes of CPR and 2 mg of AP ankle ROSC.  He was intubated.  He was then admitted in ICU.  Nephrology was consulted and patient continued to receive his hemodialysis.  Subsequently was extubated on 06/01/2019 and was transferred to medical floor under hospital service starting 06/02/2019.  Assessment & Plan:   Active Problems:   Status post creation of arteriovenous fistula   Cardiac arrest [facility])   Cardiac arrest: Underlying etiology was unclear but it is being presumed that this was likely secondary to respiratory failure due to sedatives before the operation.  He is doing well now.  Acute hypoxic respiratory failure: Required intubation in the OR.  Was managed by PCCM.  Was extubated on 06/01/2019.  Currently on 2 L of nasal oxygen.  No shortness of breath.  ESRD via [facility]DC: Gets MWF HD.  Next HD on Monday.  Nephrology on board.  Appreciate their help.  Nonischemic cardiomyopathy with biventricular dysfunction: No symptoms currently.  Takes Lipitor and aspirin at home.  He is able to take orally so we will resume those.  Strokelike symptoms: I saw this patient for the first time.  His wife was at the bedside.  Patient seems to have slightly slurred speech.  This was confirmed with the wife.  I also noticed some mild right facial droop and some left hand/arm weakness on examination.  Patient was also partially confused.  Per wife, he is usually alert and oriented at home.  This is concerning  for possible stroke.  Patient had CT of the head when he was admitted.  We will proceed with  MRI of the brain.  DVT prophylaxis: Heparin Code Status: Full code Family Communication: Wife present at bedside.  All questions answered.  Explained current status and plan of care in detail.  Disposition Plan: Unknown at this point in time.  Consulting PT OT.  Looking at him, he looks deconditioned and might need SNF.  Estimated body mass index is 29.68 kg/m as calculated from the following:   Height as of this encounter: 5\' 6"  (1.676 m).   Weight as of this encounter: 83.4 kg.      Nutritional status:  Nutrition Problem: Increased nutrient needs Etiology: chronic illness(ESRD/HD)   Signs/Symptoms: estimated needs   Interventions: Tube feeding, MVI, Prostat    Consultants:   Nephrology  Procedures:   Intubation and then extubation  Antimicrobials:   None   Subjective: Seen and examined.  Patient alert but oriented x2.  Has no complaints.  Objective: Vitals:   06/01/19 1804 06/01/19 1816 06/01/19 1834 06/02/19 0605  BP: 128/89  118/77 116/69  Pulse:    88  Resp: 13  17 17   Temp:  98.1 F (36.7 C)  98.3 F (36.8 C)  TempSrc:  Oral  Oral  SpO2: 100%   99%  Weight:    83.4 kg  Height:        Intake/Output Summary (Last 24 hours) at 06/02/2019 1133 Last data filed at 06/02/2019 0907 Gross per 24 hour  Intake 10 ml  Output --  Net 10 ml  Filed Weights   05/31/19 1310 06/01/19 0500 06/02/19 0605  Weight: 80 kg 79.4 kg 83.4 kg    Examination:  General exam: Appears calm and comfortable  Respiratory system: Clear to auscultation. Respiratory effort normal. Cardiovascular system: S1 & S2 heard, RRR. No JVD, murmurs, rubs, gallops or clicks. No pedal edema. Gastrointestinal system: Abdomen is nondistended, soft and nontender. No organomegaly or masses felt. Normal bowel sounds heard. Central nervous system: Alert and oriented x2.  Mild right facial droop.  4/5 power in left upper extremity. Skin: No rashes, lesions or ulcers  Data Reviewed: I have  personally reviewed following labs and imaging studies  CBC: Recent Labs  Lab 05/29/19 1628 05/29/19 1810 05/29/19 2014 05/30/19 0445 05/31/19 0639 06/01/19 0338 06/01/19 0500  WBC 6.9  --   --  4.6 4.8  --  6.1  NEUTROABS 5.8  --   --   --   --   --   --   HGB 14.4   < > 16.0 14.2 13.5 16.0 14.5  HCT 48.9   < > 47.0 46.0 42.9 47.0 47.0  MCV 86.4  --   --  82.7 80.8  --  81.9  PLT 179  --   --  163 150  --  170   < > = values in this interval not displayed.   Basic Metabolic Panel: Recent Labs  Lab 05/30/19 0445 05/30/19 0445 05/30/19 1654 05/31/19 0209 06/01/19 0338 06/01/19 0418 06/02/19 0450  NA 139   < > 137 137 135 139 137  K 4.5   < > 4.3 3.7 4.8 4.9 4.6  CL 100  --  99 101  --  97* 96*  CO2 25  --  25 22  --  25 26  GLUCOSE 64*  --  77 94  --  64* 90  BUN 28*  --  32* 40*  --  35* 44*  CREATININE 7.54*  --  8.89* 9.38*  --  8.09* 9.71*  CALCIUM 8.8*  --  8.6* 8.3*  --  9.1 9.1  MG 2.2  --   --   --   --   --   --   PHOS 5.8*  --   --   --   --   --  10.4*   < > = values in this interval not displayed.   GFR: Estimated Creatinine Clearance: 8.1 mL/min (A) (by C-G formula based on SCr of 9.71 mg/dL (H)). Liver Function Tests: Recent Labs  Lab 05/29/19 1628 06/01/19 0418 06/02/19 0450  AST 22 38  --   ALT 11 6  --   ALKPHOS 34* 50  --   BILITOT 0.9 1.5*  --   PROT 5.8* 6.0*  --   ALBUMIN 3.1* 2.8* 2.6*   No results for input(s): LIPASE, AMYLASE in the last 168 hours. No results for input(s): AMMONIA in the last 168 hours. Coagulation Profile: Recent Labs  Lab 05/29/19 1628 05/30/19 0445  INR 1.4* 1.3*   Cardiac Enzymes: No results for input(s): CKTOTAL, CKMB, CKMBINDEX, TROPONINI in the last 168 hours. BNP (last 3 results) No results for input(s): PROBNP in the last 8760 hours. HbA1C: No results for input(s): HGBA1C in the last 72 hours. CBG: Recent Labs  Lab 06/01/19 0416 06/01/19 0657 06/01/19 0809 06/01/19 1150 06/01/19 1533    GLUCAP 73 79 81 85 92   Lipid Profile: Recent Labs    05/31/19 0209 06/01/19 0418  TRIG 98 105  Thyroid Function Tests: No results for input(s): TSH, T4TOTAL, FREET4, T3FREE, THYROIDAB in the last 72 hours. Anemia Panel: No results for input(s): VITAMINB12, FOLATE, FERRITIN, TIBC, IRON, RETICCTPCT in the last 72 hours. Sepsis Labs: Recent Labs  Lab 05/29/19 1628 05/29/19 2002  LATICACIDVEN 3.3* 1.9    Recent Results (from the past 240 hour(s))  SARS CORONAVIRUS 2 (TAT 6-24 HRS) Nasopharyngeal Nasopharyngeal Swab     Status: None   Collection Time: 05/25/19 10:10 AM   Specimen: Nasopharyngeal Swab  Result Value Ref Range Status   SARS Coronavirus 2 NEGATIVE NEGATIVE Final    Comment: (NOTE) SARS-CoV-2 target nucleic acids are NOT DETECTED. The SARS-CoV-2 RNA is generally detectable in upper and lower respiratory specimens during the acute phase of infection. Negative results do not preclude SARS-CoV-2 infection, do not rule out co-infections with other pathogens, and should not be used as the sole basis for treatment or other patient management decisions. Negative results must be combined with clinical observations, patient history, and epidemiological information. The expected result is Negative. Fact Sheet for Patients: SugarRoll.be Fact Sheet for Healthcare Providers: https://www.woods-mathews.com/ This test is not yet approved or cleared by the [place] FDA and  has been authorized for detection and/or diagnosis of SARS-CoV-2 by FDA under an Emergency Use Authorization (EUA). This EUA will remain  in effect (meaning this test can be used) for the duration of the COVID-19 declaration under Section 56 4(b)(1) of the Act, 21 U.S.C. section 360bbb-3(b)(1), unless the authorization is terminated or revoked sooner. Performed at [facility], [place] [address], [place], [place] [address]       Radiology Studies: No  results found.  Scheduled Meds: . acyclovir  400 mg Oral Q lunch  . atorvastatin  40 mg Oral QHS  . Chlorhexidine Gluconate Cloth  6 each Topical Q0600  . dorzolamide-timolol  1 drop Both Eyes BID  . doxercalciferol  2 mcg Intravenous Q M,W,F-HD  . feeding supplement (NEPRO CARB STEADY)  237 mL Oral BID BM  . mouth rinse  15 mL Mouth Rinse BID  . multivitamin  1 tablet Oral QHS  . pantoprazole  40 mg Oral Daily  . prednisoLONE acetate  1 drop Right Eye Q1200  . sevelamer carbonate  1,600 mg Oral TID WC  . sodium chloride flush  10-40 mL Intracatheter Q12H   Continuous Infusions: . sodium chloride       LOS: 4 days   Time spent: 35 minutes.   Darliss Cheney, MD [facility]  06/02/2019, 11:33 AM   To contact the attending provider between 7A-7P or the covering provider during after hours 7P-7A, please log into the web site [email or web address].

## 2019-06-02 NOTE — Progress Notes (Signed)
[facility] Progress Note   Dialysis Orders: NW MWF 4.5h  400/800   79kg  2/2 bath  TDC    Hep 4000 - no esa active - last Hb 13.9 on 1/22 - parsabiv 2.5 mg 3 x/week - hectorol 2 mcg IV each tx  CXR 1/27 - vasc congestion   Assessment/Plan: # Cardiac arrest - s/p cooling protocol; - extubated now and awake ;episode of confusion last night and wanting to go home, pulling at lines. Ativan given, mitts applied and wife came to stay with him last night.  Very drowsy this am.  # ESRD via [facility] - gets MWF HD K 4.5 --normally runs 4.5 hours - HD ordered for 4 hr Monday  # Acute hypoxic respiratory failure s/p vent;  net UF 2 L Friday post wt 80  # Nonischemic cardiomyopathy -   # MBD - on binders/VDRA P 10.4 - has binders reordered - no change for now,   # nutrition - wife said ate well last night - too drowsy this am to eat /will add nepro/vits - s/p short term TF  # HTN - variable 90s this am.- heart rate variable with irregularity - no BP meds or BB at present.  # Anemia - hgb high - no ESA/Fe   Myriam Jacobson, PA-C [facility] Beeper [phone] 06/02/2019,8:12 AM  LOS: 4 days   Subjective:   Very drowsy but arousable Wife at bedside  Objective Vitals:   06/01/19 1804 06/01/19 1816 06/01/19 1834 06/02/19 0605  BP: 128/89  118/77 116/69  Pulse:    88  Resp: 13  17 17   Temp:  98.1 F (36.7 C)  98.3 F (36.8 C)  TempSrc:  Oral  Oral  SpO2: 100%   99%  Weight:    83.4 kg  Height:       Physical Exam General: drowsy Heart: irreg - 80s - 120s with ectopy Lungs: grossly clear Abdomen: soft NT Extremities: tr LE edema Dialysis Access: right IJ [facility]   Additional Objective Labs: Basic Metabolic Panel: Recent Labs  Lab 05/30/19 0445 05/30/19 1654 05/31/19 0209 05/31/19 0209 06/01/19 0338 06/01/19 0418 06/02/19 0450  NA 139   < > 137   < > 135 139 137  K 4.5   < > 3.7   < > 4.8 4.9 4.6  CL 100   < > 101  --   --  97* 96*   CO2 25   < > 22  --   --  25 26  GLUCOSE 64*   < > 94  --   --  64* 90  BUN 28*   < > 40*  --   --  35* 44*  CREATININE 7.54*   < > 9.38*  --   --  8.09* 9.71*  CALCIUM 8.8*   < > 8.3*  --   --  9.1 9.1  PHOS 5.8*  --   --   --   --   --  10.4*   < > = values in this interval not displayed.   Liver Function Tests: Recent Labs  Lab 05/29/19 1628 06/01/19 0418 06/02/19 0450  AST 22 38  --   ALT 11 6  --   ALKPHOS 34* 50  --   BILITOT 0.9 1.5*  --   PROT 5.8* 6.0*  --   ALBUMIN 3.1* 2.8* 2.6*   No results for input(s): LIPASE, AMYLASE in the last 168 hours. CBC: Recent Labs  Lab 05/29/19 1628 05/29/19 1810 05/30/19 0445 05/30/19 0445 05/31/19 0639 06/01/19 0338 06/01/19 0500  WBC 6.9   < > 4.6  --  4.8  --  6.1  NEUTROABS 5.8  --   --   --   --   --   --   HGB 14.4   < > 14.2   < > 13.5 16.0 14.5  HCT 48.9   < > 46.0   < > 42.9 47.0 47.0  MCV 86.4  --  82.7  --  80.8  --  81.9  PLT 179   < > 163  --  150  --  170   < > = values in this interval not displayed.   Blood Culture No results found for: SDES, SPECREQUEST, CULT, REPTSTATUS  Cardiac Enzymes: No results for input(s): CKTOTAL, CKMB, CKMBINDEX, TROPONINI in the last 168 hours. CBG: Recent Labs  Lab 06/01/19 0416 06/01/19 0657 06/01/19 0809 06/01/19 1150 06/01/19 1533  GLUCAP 73 79 81 85 92   Iron Studies: No results for input(s): IRON, TIBC, TRANSFERRIN, FERRITIN in the last 72 hours. Lab Results  Component Value Date   INR 1.3 (H) 05/30/2019   INR 1.4 (H) 05/29/2019   INR 1.22 05/14/2013   Studies/Results: No results found. Medications: . sodium chloride     . acyclovir  400 mg Oral Q lunch  . atorvastatin  40 mg Oral QHS  . Chlorhexidine Gluconate Cloth  6 each Topical Q0600  . dorzolamide-timolol  1 drop Both Eyes BID  . doxercalciferol  2 mcg Intravenous Q M,W,F-HD  . mouth rinse  15 mL Mouth Rinse BID  . pantoprazole  40 mg Oral Daily  . prednisoLONE acetate  1 drop Right Eye Q1200   . sevelamer carbonate  1,600 mg Oral TID WC  . sodium chloride flush  10-40 mL Intracatheter Q12H

## 2019-06-02 NOTE — Progress Notes (Signed)
The patient became agitated and restless, pulling at lines his lines and tubing, and requesting to leave the hospital. Many unsuccessful attempts were made by multiple staff members to reorient and calm the patient. Mittens were placed and eLink was called using the in room monitor. Ativan was ordered and given, along with verbal permission for patient's wife to come and stay the night with him. Patient's wife is in the room and the patient is asleep and resting comfortably at this time. Will continue to monitor patient closely.   Elesa Hacker, RN

## 2019-06-03 ENCOUNTER — Encounter (HOSPITAL_COMMUNITY): Payer: Self-pay | Admitting: Pulmonary Disease

## 2019-06-03 DIAGNOSIS — Q612 Polycystic kidney, adult type: Secondary | ICD-10-CM | POA: Diagnosis not present

## 2019-06-03 DIAGNOSIS — Z992 Dependence on renal dialysis: Secondary | ICD-10-CM | POA: Diagnosis not present

## 2019-06-03 DIAGNOSIS — N186 End stage renal disease: Secondary | ICD-10-CM | POA: Diagnosis not present

## 2019-06-03 LAB — BASIC METABOLIC PANEL
Anion gap: 19 — ABNORMAL HIGH (ref 5–15)
BUN: 51 mg/dL — ABNORMAL HIGH (ref 8–23)
CO2: 26 mmol/L (ref 22–32)
Calcium: 9.4 mg/dL (ref 8.9–10.3)
Chloride: 94 mmol/L — ABNORMAL LOW (ref 98–111)
Creatinine, Ser: 11.51 mg/dL — ABNORMAL HIGH (ref 0.61–1.24)
GFR calc Af Amer: 5 mL/min — ABNORMAL LOW (ref >60)
GFR calc non Af Amer: 4 mL/min — ABNORMAL LOW (ref >60)
Glucose, Bld: 70 mg/dL (ref 70–99)
Potassium: 5 mmol/L (ref 3.5–5.1)
Sodium: 139 mmol/L (ref 135–145)

## 2019-06-03 LAB — CBC
HCT: 41.2 % (ref 39.0–52.0)
Hemoglobin: 12.2 g/dL — ABNORMAL LOW (ref 13.0–17.0)
MCH: 25.4 pg — ABNORMAL LOW (ref 26.0–34.0)
MCHC: 29.6 g/dL — ABNORMAL LOW (ref 30.0–36.0)
MCV: 85.8 fL (ref 80.0–100.0)
Platelets: 135 10*3/uL — ABNORMAL LOW (ref 150–400)
RBC: 4.8 MIL/uL (ref 4.22–5.81)
RDW: 18.8 % — ABNORMAL HIGH (ref 11.5–15.5)
WBC: 4.1 10*3/uL (ref 4.0–10.5)
nRBC: 0 % (ref 0.0–0.2)

## 2019-06-03 LAB — GLUCOSE, CAPILLARY: Glucose-Capillary: 44 mg/dL — CL (ref 70–99)

## 2019-06-03 MED ORDER — PENTAFLUOROPROP-TETRAFLUOROETH EX AERO: 1.0000 "application " | INHALATION_SPRAY | CUTANEOUS | Status: DC | PRN

## 2019-06-03 MED ORDER — HEPARIN SODIUM (PORCINE) 1000 UNIT/ML DIALYSIS: 20.0000 [IU]/kg | INTRAMUSCULAR | Status: DC | PRN

## 2019-06-03 MED ORDER — HEPARIN SODIUM (PORCINE) 1000 UNIT/ML IJ SOLN
INTRAMUSCULAR | Status: AC
  Filled 2019-06-03: qty 4

## 2019-06-03 MED ORDER — HEPARIN SODIUM (PORCINE) 1000 UNIT/ML DIALYSIS: 1000.0000 [IU] | INTRAMUSCULAR | Status: DC | PRN

## 2019-06-03 MED ORDER — SODIUM CHLORIDE 0.9 % IV SOLN: 100.0000 mL | INTRAVENOUS | Status: DC | PRN

## 2019-06-03 MED ORDER — ALTEPLASE 2 MG IJ SOLR: 2.0000 mg | Freq: Once | INTRAMUSCULAR | Status: DC | PRN

## 2019-06-03 MED ORDER — LIDOCAINE-PRILOCAINE 2.5-2.5 % EX CREA: 1.0000 "application " | TOPICAL_CREAM | CUTANEOUS | Status: DC | PRN

## 2019-06-03 MED ORDER — LIDOCAINE HCL (PF) 1 % IJ SOLN: 5.0000 mL | INTRAMUSCULAR | Status: DC | PRN

## 2019-06-03 MED ORDER — DOXERCALCIFEROL 4 MCG/2ML IV SOLN
INTRAVENOUS | Status: AC
  Filled 2019-06-03: qty 2

## 2019-06-03 MED ORDER — HEPARIN SODIUM (PORCINE) 1000 UNIT/ML IJ SOLN
INTRAMUSCULAR | Status: AC
  Administered 2019-06-03: 1700 [IU] via INTRAVENOUS_CENTRAL
  Filled 2019-06-03: qty 2

## 2019-06-03 NOTE — Progress Notes (Signed)
[facility] Progress Note   Dialysis Orders: NW MWF 4.5h  400/800   79kg  2/2 bath  TDC    Hep 4000 - no esa active - last Hb 13.9 on 1/22 - parsabiv 2.5 mg 3 x/week - hectorol 2 mcg IV each tx  CXR 1/27 - vasc congestion   Assessment/Plan: # Cardiac arrest - s/p cooling protocol; - extubated now and awake.   # ESRD via [facility] - gets MWF HD K 4.5 --normally runs 4.5 hours - HD ordered for 4 hr today with goal 2-2.5L as tolerated > will increase goal to 2.5-3L given wt and edema, will plan next 2/3.   # Acute hypoxic respiratory failure s/p vent;  net UF 2 L Friday post wt 80, wt this AM 84.2kg.  UF per above as tolerated.  # Nonischemic cardiomyopathy - volume maintenance with HD.   # MBD - on binders/VDRA P 10.4 - has binders reordered - no change for now, check with next labs.   # nutrition - cont nepro/vits - s/p short term TF  # HTN - variable 90-100s this am.- heart rate variable with irregularity - no BP meds or BB at present.  # Anemia - hgb high - no ESA/Fe  Jannifer Hick MD [facility]   Subjective:   Seen at HD. Able to tell me where he is and his name - wearing mitts still.  Denies dyspnea, cough.   Objective Vitals:   06/03/19 0650 06/03/19 0659 06/03/19 0700 06/03/19 0730  BP: (P) 138/74 (P) 138/74 (!) (P) 145/65 (P) 108/65  Pulse: (P) 71 (P) 71 (P) 65 (!) (P) 58  Resp: (!) (P) 21     Temp: (P) 98.2 F (36.8 C)     TempSrc: (P) Oral     SpO2: (P) 97%     Weight: (P) 84.2 kg     Height:       Physical Exam General: drowsy Heart: irreg ~100 this AM Lungs: normal WOB, scattered rhonchi L > R, no rales Abdomen: soft NT Extremities: 1+ LE edema Dialysis Access: right IJ [facility]   Additional Objective Labs: Basic Metabolic Panel: Recent Labs  Lab 05/30/19 0445 05/30/19 1654 06/02/19 0450 06/02/19 2006 06/03/19 0552  NA 139   < > 137 137 139  K 4.5   < > 4.6 4.9 5.0  CL 100   < > 96* 94* 94*  CO2 25   < > 26 25  26   GLUCOSE 64*   < > 90 90 70  BUN 28*   < > 44* 47* 51*  CREATININE 7.54*   < > 9.71* 10.59* 11.51*  CALCIUM 8.8*   < > 9.1 9.1 9.4  PHOS 5.8*  --  10.4*  --   --    < > = values in this interval not displayed.   Liver Function Tests: Recent Labs  Lab 05/29/19 1628 06/01/19 0418 06/02/19 0450  AST 22 38  --   ALT 11 6  --   ALKPHOS 34* 50  --   BILITOT 0.9 1.5*  --   PROT 5.8* 6.0*  --   ALBUMIN 3.1* 2.8* 2.6*   No results for input(s): LIPASE, AMYLASE in the last 168 hours. CBC: Recent Labs  Lab 05/29/19 1628 05/29/19 1810 05/30/19 0445 05/30/19 0445 05/31/19 0639 05/31/19 0639 06/01/19 0338 06/01/19 0500 06/03/19 0552  WBC 6.9   < > 4.6   < > 4.8  --   --  6.1  4.1  NEUTROABS 5.8  --   --   --   --   --   --   --   --   HGB 14.4   < > 14.2   < > 13.5   < > 16.0 14.5 12.2*  HCT 48.9   < > 46.0   < > 42.9   < > 47.0 47.0 41.2  MCV 86.4  --  82.7  --  80.8  --   --  81.9 85.8  PLT 179   < > 163   < > 150  --   --  170 135*   < > = values in this interval not displayed.   Blood Culture No results found for: SDES, SPECREQUEST, CULT, REPTSTATUS  Cardiac Enzymes: No results for input(s): CKTOTAL, CKMB, CKMBINDEX, TROPONINI in the last 168 hours. CBG: Recent Labs  Lab 06/01/19 0416 06/01/19 0657 06/01/19 0809 06/01/19 1150 06/01/19 1533  GLUCAP 73 79 81 85 92   Iron Studies: No results for input(s): IRON, TIBC, TRANSFERRIN, FERRITIN in the last 72 hours. Lab Results  Component Value Date   INR 1.3 (H) 05/30/2019   INR 1.4 (H) 05/29/2019   INR 1.22 05/14/2013   Studies/Results: MR BRAIN WO CONTRAST  Result Date: 06/02/2019 CLINICAL DATA:  Cardiac arrest with abnormal neurologic status subsequent. EXAM: MRI HEAD WITHOUT CONTRAST TECHNIQUE: Multiplanar, multiecho pulse sequences of the brain and surrounding structures were obtained without intravenous contrast. COMPARISON:  CT 05/29/2019 FINDINGS: Brain: Diffusion imaging does not show any acute or subacute  infarction. No sign of anoxic brain injury. Brainstem and cerebellum are normal. Cerebral hemispheres show mild age related atrophy without small or large vessel infarction. No mass lesion, hemorrhage, hydrocephalus or extra-axial collection. Vascular: Major vessels at the base of the brain show flow. Skull and upper cervical spine: Negative Sinuses/Orbits: Clear/normal Other: None IMPRESSION: Mild cerebral hemispheric brain atrophy for age. Otherwise normal examination. No old or acute small or large vessel infarction. No evidence of diffuse hypoxic ischemic injury. Electronically Signed   By: Nelson Chimes M.D.   On: 06/02/2019 12:54   Medications: . sodium chloride    . sodium chloride    . sodium chloride     . acyclovir  400 mg Oral Q lunch  . aspirin EC  81 mg Oral Daily  . atorvastatin  40 mg Oral QHS  . Chlorhexidine Gluconate Cloth  6 each Topical Q0600  . dorzolamide-timolol  1 drop Both Eyes BID  . doxercalciferol  2 mcg Intravenous Q M,W,F-HD  . feeding supplement (NEPRO CARB STEADY)  237 mL Oral BID BM  . heparin      . heparin  5,000 Units Subcutaneous Q8H  . mouth rinse  15 mL Mouth Rinse BID  . multivitamin  1 tablet Oral QHS  . pantoprazole  40 mg Oral Daily  . prednisoLONE acetate  1 drop Right Eye Q1200  . sevelamer carbonate  1,600 mg Oral TID WC  . sodium chloride flush  10-40 mL Intracatheter Q12H

## 2019-06-03 NOTE — Progress Notes (Signed)
Rehab Admissions Coordinator Note:  Patient was screened by Cleatrice Burke for appropriateness for an Inpatient Acute Rehab Consult per OT recs. .  At this time, we are recommending Inpatient Rehab consult. I will place order.  Cleatrice Burke RN MSN 06/03/2019, 8:12 AM  I can be reached at [phone].

## 2019-06-03 NOTE — Progress Notes (Signed)
Physical Therapy Treatment Patient Details Name: Michael [name]. [name]: [id number] DOB: 1955/09/25 Today's Date: 06/03/2019    History of Present Illness 64 y/o male with a PMH of ESRD, HLD, HTN, OSA, V. Tach, and Hyperparathyroidism, presents s/p pre operative (R AV fistula) cardiac arrest.    PT Comments    Pt more alert today compared to Saturday with OT. Pt presenting with impaired sequencing, delayed response time, decreased insight to deficits and safety, is impulsive, only oriented to self, and is requiring modA for all mobility. Per wife pt was indep and amb without AD PTA. Pt to benefit from aggressive rehab program to achieve safe supervision level of function for safe transition home with spouse.    Follow Up Recommendations  CIR     Equipment Recommendations  Rolling walker with 5" wheels    Recommendations for Other Services       Precautions / Restrictions Precautions Precautions: Fall Restrictions Weight Bearing Restrictions: No    Mobility  Bed Mobility Overal bed mobility: Needs Assistance Bed Mobility: Rolling;Sidelying to Sit Rolling: Mod assist Sidelying to sit: Mod assist       General bed mobility comments: max directional very cues, modA for trunk elevation and to bring hips to EOB  Transfers Overall transfer level: Needs assistance Equipment used: Rolling walker (2 wheeled) Transfers: Sit to/from Stand Sit to Stand: Mod assist;+2 physical assistance;+2 safety/equipment         General transfer comment: modAx2 from bed, minA from chair as it was elevated surface. verbal/tactile cues for hand placement  Ambulation/Gait Ambulation/Gait assistance: Mod assist;+2 safety/equipment(chair follow/line management) Gait Distance (Feet): 60 Feet(x1, 100'x1) Assistive device: Rolling walker (2 wheeled) Gait Pattern/deviations: Step-to pattern;Drifts right/left;Trunk flexed Gait velocity: dec Gait velocity interpretation: <1.8 ft/sec, indicate of  risk for recurrent falls General Gait Details: pt with increased trunk flexion, can achieve full upright posture with verbal cues but has difficulty maintaining it, pt with vearing to the R especially with onset of fatigue, pt with desire to keep ambulating despite tripping over feet and running into obstacles and requiring more assist   Stairs             Wheelchair Mobility    Modified Rankin (Stroke Patients Only)       Balance Overall balance assessment: Needs assistance Sitting-balance support: No upper extremity supported;Feet supported Sitting balance-Leahy Scale: Poor Sitting balance - Comments: assist for sitting upright and cues to hold self up   Standing balance support: Bilateral upper extremity supported Standing balance-Leahy Scale: Poor Standing balance comment: Pt standing with modA+2 for stability                            Cognition Arousal/Alertness: Awake/alert(but sleepy due to having HD this morning) Behavior During Therapy: Impulsive Overall Cognitive Status: Impaired/Different from baseline Area of Impairment: Orientation;Following commands;Safety/judgement                 Orientation Level: Disoriented to;Place;Time;Situation     Following Commands: Follows one step commands with increased time;Follows one step commands inconsistently Safety/Judgement: Decreased awareness of safety;Decreased awareness of deficits(impulsive)   Problem Solving: Slow processing;Difficulty sequencing;Requires verbal cues;Requires tactile cues General Comments: pt sleepy but responsive, pt participating in PT however impulsive with decreased insight into deficits      Exercises      General Comments General comments (skin integrity, edema, etc.): VSS      Pertinent Vitals/Pain Pain Assessment: No/denies pain  Home Living                      Prior Function            PT Goals (current goals can now be found in the care  plan section) Progress towards PT goals: Progressing toward goals    Frequency    Min 3X/week      PT Plan Discharge plan needs to be updated    Co-evaluation              AM-PAC PT "6 Clicks" Mobility   Outcome Measure  Help needed turning from your back to your side while in a flat bed without using bedrails?: A Lot Help needed moving from lying on your back to sitting on the side of a flat bed without using bedrails?: A Lot Help needed moving to and from a bed to a chair (including a wheelchair)?: A Lot Help needed standing up from a chair using your arms (e.g., wheelchair or bedside chair)?: A Lot Help needed to walk in hospital room?: A Lot Help needed climbing 3-5 steps with a railing? : Total 6 Click Score: 11    End of Session Equipment Utilized During Treatment: Gait belt Activity Tolerance: Patient tolerated treatment well Patient left: in chair;with call bell/phone within reach;with chair alarm set;with family/visitor present Nurse Communication: Mobility status(notified RN that bilat mits were off but wife present) PT Visit Diagnosis: Unsteadiness on feet (R26.81)     Time: 1310-1340 PT Time Calculation (min) (ACUTE ONLY): 30 min  Charges:  $Gait Training: 23-37 mins                     Kittie Plater, PT, DPT Acute Rehabilitation Services Pager #: [phone] Office #: [phone]    Berline Lopes 06/03/2019, 2:26 PM

## 2019-06-03 NOTE — Progress Notes (Signed)
PROGRESS NOTE    Michael [name].  JAS:[id number] DOB: 10-09-1955 DOA: 05/29/2019 PCP: [name], MD   Brief Narrative:  Michael, [name]. is a 64 year old gentleman who was a scheduled to have right AV fistula versus graft done by vascular surgery on 05/28/2018 however unfortunately, prior to incision he had a cardiac arrest in the operating room.  Patient became hypotensive and hypoxic and ultimately got 4 minutes of CPR and 2 mg of AP ankle ROSC.  He was intubated.  He was then admitted in ICU.  Nephrology was consulted and patient continued to receive his hemodialysis.  Subsequently was extubated on 06/01/2019 and was transferred to medical floor under hospital service starting 06/02/2019.  Assessment & Plan:   Active Problems:   Status post creation of arteriovenous fistula   Cardiac arrest [facility])   Cardiac arrest: Underlying etiology was unclear but it is being presumed that this was likely secondary to respiratory failure due to sedatives before the operation.  He is doing well now.  Acute hypoxic respiratory failure: Required intubation in the OR.  Was managed by PCCM.  Was extubated on 06/01/2019.  Currently saturating 100% on room air.  ESRD via TDC: Gets MWF HD.  Appreciate nephrology help.  Nonischemic cardiomyopathy with biventricular dysfunction: No symptoms currently.  Takes Lipitor and aspirin at home.  He is able to take orally so we will resume those.  Strokelike symptoms: I saw this patient for the first time on 06/02/2019.  His wife was at the bedside.  Patient seemed to have slightly slurred speech.  This was confirmed with the wife.  I also noticed some mild right facial droop and some left hand/arm weakness on examination.  Patient was also partially confused.  Per wife, he is usually alert and oriented at home.  This is concerning  for possible stroke.  Patient had CT of the head when he was admitted.  MRI brain was obtained which did not show any acute stroke.   Patient still has some slurred speech and some left upper extremity weakness.  PT OT recommends CIR.  CIR coordinator on board.  DVT prophylaxis: Heparin Code Status: Full code Family Communication: No family member present at bedside. Disposition Plan: Will be discharged to CIR once insurance authorization received and bed available.  Medically stable otherwise.  Estimated body mass index is 27.61 kg/m as calculated from the following:   Height as of this encounter: 5\' 6"  (1.676 m).   Weight as of this encounter: 77.6 kg.      Nutritional status:  Nutrition Problem: Increased nutrient needs Etiology: chronic illness(ESRD/HD)   Signs/Symptoms: estimated needs   Interventions: Tube feeding, MVI, Prostat    Consultants:   Nephrology  Procedures:   Intubation and then extubation  Antimicrobials:   None   Subjective: Patient seen and examined in dialysis unit.  Still has some slurred speech.  Alert and more oriented than yesterday.  No complaints.  Objective: Vitals:   06/03/19 1000 06/03/19 1030 06/03/19 1059 06/03/19 1201  BP: 121/70 (!) 143/78 (!) 165/95 (!) 143/76  Pulse: 64 67 77 68  Resp:   17 18  Temp:   (!) 97.4 F (36.3 C) 97.9 F (36.6 C)  TempSrc:   Oral Oral  SpO2:   97% 100%  Weight:   77.6 kg   Height:        Intake/Output Summary (Last 24 hours) at 06/03/2019 1355 Last data filed at 06/02/2019 2230 Gross per 24 hour  Intake 150  ml  Output -  Net 150 ml   Filed Weights   06/03/19 0356 06/03/19 0650 06/03/19 1059  Weight: 84.2 kg 84.2 kg 77.6 kg    Examination:  General exam: Appears calm and comfortable  Respiratory system: Clear to auscultation. Respiratory effort normal. Cardiovascular system: S1 & S2 heard, RRR. No JVD, murmurs, rubs, gallops or clicks. No pedal edema. Gastrointestinal system: Abdomen is nondistended, soft and nontender. No organomegaly or masses felt. Normal bowel sounds heard. Central nervous system: Alert and  oriented.  Slurred speech.  Minimal left-sided weakness. Extremities: Symmetric 5 x 5 power. Skin: No rashes, lesions or ulcers.  Psychiatry: Judgement and insight appear normal. Mood & affect appropriate.   Data Reviewed: I have personally reviewed following labs and imaging studies  CBC: Recent Labs  Lab 05/29/19 1628 05/29/19 1810 05/30/19 0445 05/31/19 0639 06/01/19 0338 06/01/19 0500 06/03/19 0552  WBC 6.9  --  4.6 4.8  --  6.1 4.1  NEUTROABS 5.8  --   --   --   --   --   --   HGB 14.4   < > 14.2 13.5 16.0 14.5 12.2*  HCT 48.9   < > 46.0 42.9 47.0 47.0 41.2  MCV 86.4  --  82.7 80.8  --  81.9 85.8  PLT 179  --  163 150  --  170 135*   < > = values in this interval not displayed.   Basic Metabolic Panel: Recent Labs  Lab 05/30/19 0445 05/30/19 1654 05/31/19 0209 05/31/19 0209 06/01/19 0338 06/01/19 0418 06/02/19 0450 06/02/19 2006 06/03/19 0552  NA 139   < > 137   < > 135 139 137 137 139  K 4.5   < > 3.7   < > 4.8 4.9 4.6 4.9 5.0  CL 100   < > 101  --   --  97* 96* 94* 94*  CO2 25   < > 22  --   --  25 26 25 26   GLUCOSE 64*   < > 94  --   --  64* 90 90 70  BUN 28*   < > 40*  --   --  35* 44* 47* 51*  CREATININE 7.54*   < > 9.38*  --   --  8.09* 9.71* 10.59* 11.51*  CALCIUM 8.8*   < > 8.3*  --   --  9.1 9.1 9.1 9.4  MG 2.2  --   --   --   --   --   --  2.4  --   PHOS 5.8*  --   --   --   --   --  10.4*  --   --    < > = values in this interval not displayed.   GFR: Estimated Creatinine Clearance: 6.6 mL/min (A) (by C-G formula based on SCr of 11.51 mg/dL (H)). Liver Function Tests: Recent Labs  Lab 05/29/19 1628 06/01/19 0418 06/02/19 0450  AST 22 38  --   ALT 11 6  --   ALKPHOS 34* 50  --   BILITOT 0.9 1.5*  --   PROT 5.8* 6.0*  --   ALBUMIN 3.1* 2.8* 2.6*   No results for input(s): LIPASE, AMYLASE in the last 168 hours. No results for input(s): AMMONIA in the last 168 hours. Coagulation Profile: Recent Labs  Lab 05/29/19 1628 05/30/19 0445   INR 1.4* 1.3*   Cardiac Enzymes: No results for input(s): CKTOTAL, CKMB, CKMBINDEX, TROPONINI in  the last 168 hours. BNP (last 3 results) No results for input(s): PROBNP in the last 8760 hours. HbA1C: No results for input(s): HGBA1C in the last 72 hours. CBG: Recent Labs  Lab 06/01/19 0416 06/01/19 0657 06/01/19 0809 06/01/19 1150 06/01/19 1533  GLUCAP 73 79 81 85 92   Lipid Profile: Recent Labs    06/01/19 0418  TRIG 105   Thyroid Function Tests: No results for input(s): TSH, T4TOTAL, FREET4, T3FREE, THYROIDAB in the last 72 hours. Anemia Panel: No results for input(s): VITAMINB12, FOLATE, FERRITIN, TIBC, IRON, RETICCTPCT in the last 72 hours. Sepsis Labs: Recent Labs  Lab 05/29/19 1628 05/29/19 2002  LATICACIDVEN 3.3* 1.9    Recent Results (from the past 240 hour(s))  SARS CORONAVIRUS 2 (TAT 6-24 HRS) Nasopharyngeal Nasopharyngeal Swab     Status: None   Collection Time: 05/25/19 10:10 AM   Specimen: Nasopharyngeal Swab  Result Value Ref Range Status   SARS Coronavirus 2 NEGATIVE NEGATIVE Final    Comment: (NOTE) SARS-CoV-2 target nucleic acids are NOT DETECTED. The SARS-CoV-2 RNA is generally detectable in upper and lower respiratory specimens during the acute phase of infection. Negative results do not preclude SARS-CoV-2 infection, do not rule out co-infections with other pathogens, and should not be used as the sole basis for treatment or other patient management decisions. Negative results must be combined with clinical observations, patient history, and epidemiological information. The expected result is Negative. Fact Sheet for Patients: SugarRoll.be Fact Sheet for Healthcare Providers: https://www.woods-mathews.com/ This test is not yet approved or cleared by the [place] FDA and  has been authorized for detection and/or diagnosis of SARS-CoV-2 by FDA under an Emergency Use Authorization (EUA). This EUA  will remain  in effect (meaning this test can be used) for the duration of the COVID-19 declaration under Section 56 4(b)(1) of the Act, 21 U.S.C. section 360bbb-3(b)(1), unless the authorization is terminated or revoked sooner. Performed at [facility], [place] [address] [place], [place] [address]       Radiology Studies: MR BRAIN WO CONTRAST  Result Date: 06/02/2019 CLINICAL DATA:  Cardiac arrest with abnormal neurologic status subsequent. EXAM: MRI HEAD WITHOUT CONTRAST TECHNIQUE: Multiplanar, multiecho pulse sequences of the brain and surrounding structures were obtained without intravenous contrast. COMPARISON:  CT 05/29/2019 FINDINGS: Brain: Diffusion imaging does not show any acute or subacute infarction. No sign of anoxic brain injury. Brainstem and cerebellum are normal. Cerebral hemispheres show mild age related atrophy without small or large vessel infarction. No mass lesion, hemorrhage, hydrocephalus or extra-axial collection. Vascular: Major vessels at the base of the brain show flow. Skull and upper cervical spine: Negative Sinuses/Orbits: Clear/normal Other: None IMPRESSION: Mild cerebral hemispheric brain atrophy for age. Otherwise normal examination. No old or acute small or large vessel infarction. No evidence of diffuse hypoxic ischemic injury. Electronically Signed   By: Nelson Chimes M.D.   On: 06/02/2019 12:54    Scheduled Meds: . acyclovir  400 mg Oral Q lunch  . aspirin EC  81 mg Oral Daily  . atorvastatin  40 mg Oral QHS  . Chlorhexidine Gluconate Cloth  6 each Topical Q0600  . dorzolamide-timolol  1 drop Both Eyes BID  . doxercalciferol  2 mcg Intravenous Q M,W,F-HD  . feeding supplement (NEPRO CARB STEADY)  237 mL Oral BID BM  . heparin  5,000 Units Subcutaneous Q8H  . mouth rinse  15 mL Mouth Rinse BID  . multivitamin  1 tablet Oral QHS  . pantoprazole  40 mg Oral Daily  . prednisoLONE acetate  1 drop Right Eye Q1200  . sevelamer carbonate  1,600 mg Oral  TID WC  . sodium chloride flush  10-40 mL Intracatheter Q12H   Continuous Infusions: . sodium chloride       LOS: 5 days   Time spent: 35 minutes.   Darliss Cheney, MD [facility]  06/03/2019, 1:55 PM   To contact the attending provider between 7A-7P or the covering provider during after hours 7P-7A, please log into the web site [email or web address].

## 2019-06-03 NOTE — Progress Notes (Signed)
64 year old male with end-stage renal disease that was scheduled for right arm AV fistula versus graft last week when he had cardiac arrest in the OR.  He was seen in dialysis this morning and I updated him on the events from last week and the fact that his surgery was canceled due to cardiac arrest prior to incision.  I would let him completely recover from this event and appears he may be going to rehab.  We will schedule follow-up in the office to see how he is recovering and we can make plans for access in the future.  Call vascular with questions or concerns.  Marty Heck, MD [facility] Office: [phone] Pager: [place]

## 2019-06-03 NOTE — Progress Notes (Signed)
Inpatient Rehab Admissions:  Inpatient Rehab Consult received.  I met with pt and his wife at the bedside for rehabilitation assessment and to discuss goals and expectations of an inpatient rehab admission.  Both the patient and his wife are on board with pursing CIR once medically ready. Wife is aware pt will need 24/7 supervision at DC due to cognitive deficits and she is currently working on that. Pt's HR noted to fluctuate on the monitor (70s-135s) during conversation. Alerted MD to observation. Will follow for possible admit to CIR, pending medical readiness.   Please call if questions.   Raechel Ache, OTR/L  Rehab Admissions Coordinator  [phone] 06/03/2019 4:32 PM

## 2019-06-04 ENCOUNTER — Other Ambulatory Visit: Payer: Self-pay

## 2019-06-04 ENCOUNTER — Encounter (HOSPITAL_COMMUNITY): Payer: Self-pay | Admitting: Physical Medicine & Rehabilitation

## 2019-06-04 ENCOUNTER — Inpatient Hospital Stay (HOSPITAL_COMMUNITY)
Admission: RE | Admit: 2019-06-04 | Discharge: 2019-06-11 | DRG: 945 | Disposition: A | Discharge status: Home / Self Care | Payer: Medicare Other | Source: Intra-hospital | Attending: Physical Medicine & Rehabilitation | Admitting: Physical Medicine & Rehabilitation

## 2019-06-04 ENCOUNTER — Inpatient Hospital Stay (HOSPITAL_COMMUNITY): Payer: Medicare Other

## 2019-06-04 DIAGNOSIS — Z8349 Family history of other endocrine, nutritional and metabolic diseases: Secondary | ICD-10-CM

## 2019-06-04 DIAGNOSIS — Z823 Family history of stroke: Secondary | ICD-10-CM

## 2019-06-04 DIAGNOSIS — I472 Ventricular tachycardia: Secondary | ICD-10-CM

## 2019-06-04 DIAGNOSIS — I120 Hypertensive chronic kidney disease with stage 5 chronic kidney disease or end stage renal disease: Secondary | ICD-10-CM | POA: Diagnosis present

## 2019-06-04 DIAGNOSIS — Z8674 Personal history of sudden cardiac arrest: Secondary | ICD-10-CM

## 2019-06-04 DIAGNOSIS — R5381 Other malaise: Secondary | ICD-10-CM | POA: Diagnosis present

## 2019-06-04 DIAGNOSIS — I429 Cardiomyopathy, unspecified: Secondary | ICD-10-CM | POA: Diagnosis not present

## 2019-06-04 DIAGNOSIS — Z841 Family history of disorders of kidney and ureter: Secondary | ICD-10-CM

## 2019-06-04 DIAGNOSIS — G4733 Obstructive sleep apnea (adult) (pediatric): Secondary | ICD-10-CM | POA: Diagnosis present

## 2019-06-04 DIAGNOSIS — N2581 Secondary hyperparathyroidism of renal origin: Secondary | ICD-10-CM | POA: Diagnosis present

## 2019-06-04 DIAGNOSIS — I361 Nonrheumatic tricuspid (valve) insufficiency: Secondary | ICD-10-CM

## 2019-06-04 DIAGNOSIS — G931 Anoxic brain damage, not elsewhere classified: Secondary | ICD-10-CM | POA: Diagnosis present

## 2019-06-04 DIAGNOSIS — D631 Anemia in chronic kidney disease: Secondary | ICD-10-CM | POA: Diagnosis present

## 2019-06-04 DIAGNOSIS — R269 Unspecified abnormalities of gait and mobility: Secondary | ICD-10-CM | POA: Diagnosis present

## 2019-06-04 DIAGNOSIS — I469 Cardiac arrest, cause unspecified: Secondary | ICD-10-CM | POA: Diagnosis not present

## 2019-06-04 DIAGNOSIS — Q613 Polycystic kidney, unspecified: Secondary | ICD-10-CM

## 2019-06-04 DIAGNOSIS — D696 Thrombocytopenia, unspecified: Secondary | ICD-10-CM

## 2019-06-04 DIAGNOSIS — I420 Dilated cardiomyopathy: Secondary | ICD-10-CM

## 2019-06-04 DIAGNOSIS — Z8679 Personal history of other diseases of the circulatory system: Secondary | ICD-10-CM

## 2019-06-04 DIAGNOSIS — R2681 Unsteadiness on feet: Secondary | ICD-10-CM | POA: Diagnosis present

## 2019-06-04 DIAGNOSIS — T82528A Displacement of other cardiac and vascular devices and implants, initial encounter: Secondary | ICD-10-CM

## 2019-06-04 DIAGNOSIS — Z992 Dependence on renal dialysis: Secondary | ICD-10-CM

## 2019-06-04 DIAGNOSIS — I10 Essential (primary) hypertension: Secondary | ICD-10-CM

## 2019-06-04 DIAGNOSIS — I471 Supraventricular tachycardia: Secondary | ICD-10-CM

## 2019-06-04 DIAGNOSIS — Z87442 Personal history of urinary calculi: Secondary | ICD-10-CM

## 2019-06-04 DIAGNOSIS — N186 End stage renal disease: Secondary | ICD-10-CM | POA: Diagnosis present

## 2019-06-04 DIAGNOSIS — I428 Other cardiomyopathies: Secondary | ICD-10-CM

## 2019-06-04 DIAGNOSIS — R002 Palpitations: Secondary | ICD-10-CM | POA: Diagnosis not present

## 2019-06-04 DIAGNOSIS — I351 Nonrheumatic aortic (valve) insufficiency: Secondary | ICD-10-CM

## 2019-06-04 DIAGNOSIS — J9601 Acute respiratory failure with hypoxia: Secondary | ICD-10-CM | POA: Diagnosis not present

## 2019-06-04 DIAGNOSIS — Z8249 Family history of ischemic heart disease and other diseases of the circulatory system: Secondary | ICD-10-CM | POA: Diagnosis not present

## 2019-06-04 DIAGNOSIS — D638 Anemia in other chronic diseases classified elsewhere: Secondary | ICD-10-CM | POA: Diagnosis not present

## 2019-06-04 DIAGNOSIS — D72819 Decreased white blood cell count, unspecified: Secondary | ICD-10-CM | POA: Diagnosis not present

## 2019-06-04 DIAGNOSIS — H3500 Unspecified background retinopathy: Secondary | ICD-10-CM | POA: Diagnosis present

## 2019-06-04 DIAGNOSIS — D709 Neutropenia, unspecified: Secondary | ICD-10-CM | POA: Diagnosis present

## 2019-06-04 LAB — BASIC METABOLIC PANEL
Anion gap: 13 (ref 5–15)
BUN: 35 mg/dL — ABNORMAL HIGH (ref 8–23)
CO2: 26 mmol/L (ref 22–32)
Calcium: 8.8 mg/dL — ABNORMAL LOW (ref 8.9–10.3)
Chloride: 98 mmol/L (ref 98–111)
Creatinine, Ser: 9.67 mg/dL — ABNORMAL HIGH (ref 0.61–1.24)
GFR calc Af Amer: 6 mL/min — ABNORMAL LOW (ref >60)
GFR calc non Af Amer: 5 mL/min — ABNORMAL LOW (ref >60)
Glucose, Bld: 91 mg/dL (ref 70–99)
Potassium: 4.2 mmol/L (ref 3.5–5.1)
Sodium: 137 mmol/L (ref 135–145)

## 2019-06-04 LAB — CBC
HCT: 43.2 % (ref 39.0–52.0)
Hemoglobin: 12.9 g/dL — ABNORMAL LOW (ref 13.0–17.0)
MCH: 25.5 pg — ABNORMAL LOW (ref 26.0–34.0)
MCHC: 29.9 g/dL — ABNORMAL LOW (ref 30.0–36.0)
MCV: 85.4 fL (ref 80.0–100.0)
Platelets: 128 10*3/uL — ABNORMAL LOW (ref 150–400)
RBC: 5.06 MIL/uL (ref 4.22–5.81)
RDW: 18.8 % — ABNORMAL HIGH (ref 11.5–15.5)
WBC: 3.9 10*3/uL — ABNORMAL LOW (ref 4.0–10.5)
nRBC: 0 % (ref 0.0–0.2)

## 2019-06-04 MED ORDER — PROCHLORPERAZINE MALEATE 5 MG PO TABS
5.0000 mg | ORAL_TABLET | Freq: Four times a day (QID) | ORAL | Status: DC | PRN
  Filled 2019-06-04: qty 2

## 2019-06-04 MED ORDER — NEPRO/CARBSTEADY PO LIQD
237.0000 mL | Freq: Two times a day (BID) | ORAL | Status: DC
  Administered 2019-06-05: 237 mL via ORAL

## 2019-06-04 MED ORDER — SENNA 8.6 MG PO TABS: 1.0000 | ORAL_TABLET | Freq: Two times a day (BID) | ORAL | Status: DC | PRN

## 2019-06-04 MED ORDER — METOPROLOL SUCCINATE ER 25 MG PO TB24
12.5000 mg | ORAL_TABLET | Freq: Two times a day (BID) | ORAL | Status: DC
  Administered 2019-06-04: 12.5 mg via ORAL
  Filled 2019-06-04: qty 1

## 2019-06-04 MED ORDER — RENA-VITE PO TABS
1.0000 | ORAL_TABLET | Freq: Every day | ORAL | Status: DC
  Administered 2019-06-04 – 2019-06-10 (×7): 1 via ORAL
  Filled 2019-06-04 (×7): qty 1

## 2019-06-04 MED ORDER — SEVELAMER CARBONATE 800 MG PO TABS
1600.0000 mg | ORAL_TABLET | Freq: Three times a day (TID) | ORAL | Status: DC
  Administered 2019-06-04 – 2019-06-11 (×17): 1600 mg via ORAL
  Filled 2019-06-04 (×18): qty 2

## 2019-06-04 MED ORDER — METOPROLOL SUCCINATE ER 25 MG PO TB24: 12.5000 mg | ORAL_TABLET | Freq: Every day | ORAL | 0 refills | Status: DC

## 2019-06-04 MED ORDER — ASPIRIN EC 81 MG PO TBEC
81.0000 mg | DELAYED_RELEASE_TABLET | Freq: Every day | ORAL | Status: DC
  Administered 2019-06-05 – 2019-06-11 (×7): 81 mg via ORAL
  Filled 2019-06-04 (×7): qty 1

## 2019-06-04 MED ORDER — DOXERCALCIFEROL 4 MCG/2ML IV SOLN
2.0000 ug | INTRAVENOUS | Status: DC
  Administered 2019-06-08: 15:00:00 2 ug via INTRAVENOUS
  Filled 2019-06-04 (×3): qty 2

## 2019-06-04 MED ORDER — GUAIFENESIN-DM 100-10 MG/5ML PO SYRP: 5.0000 mL | ORAL_SOLUTION | Freq: Four times a day (QID) | ORAL | Status: DC | PRN

## 2019-06-04 MED ORDER — ALBUTEROL SULFATE (2.5 MG/3ML) 0.083% IN NEBU: 2.5000 mg | INHALATION_SOLUTION | RESPIRATORY_TRACT | Status: DC | PRN

## 2019-06-04 MED ORDER — MILK AND MOLASSES ENEMA
1.0000 | Freq: Every day | RECTAL | Status: DC | PRN
  Filled 2019-06-04: qty 240

## 2019-06-04 MED ORDER — METOPROLOL SUCCINATE ER 25 MG PO TB24: 12.5000 mg | ORAL_TABLET | Freq: Every day | ORAL | Status: DC

## 2019-06-04 MED ORDER — PANTOPRAZOLE SODIUM 40 MG PO TBEC
40.0000 mg | DELAYED_RELEASE_TABLET | Freq: Every day | ORAL | Status: DC
  Administered 2019-06-05 – 2019-06-11 (×7): 40 mg via ORAL
  Filled 2019-06-04 (×7): qty 1

## 2019-06-04 MED ORDER — HEPARIN SODIUM (PORCINE) 5000 UNIT/ML IJ SOLN
5000.0000 [IU] | Freq: Three times a day (TID) | INTRAMUSCULAR | Status: DC
  Administered 2019-06-04 – 2019-06-11 (×17): 5000 [IU] via SUBCUTANEOUS
  Filled 2019-06-04 (×17): qty 1

## 2019-06-04 MED ORDER — ATORVASTATIN CALCIUM 40 MG PO TABS
40.0000 mg | ORAL_TABLET | Freq: Every day | ORAL | Status: DC
  Administered 2019-06-04 – 2019-06-10 (×7): 40 mg via ORAL
  Filled 2019-06-04 (×7): qty 1

## 2019-06-04 MED ORDER — METOPROLOL SUCCINATE ER 25 MG PO TB24: 12.5000 mg | ORAL_TABLET | Freq: Two times a day (BID) | ORAL | 0 refills | Status: DC

## 2019-06-04 MED ORDER — CHLORHEXIDINE GLUCONATE CLOTH 2 % EX PADS
6.0000 | MEDICATED_PAD | Freq: Every day | CUTANEOUS | Status: DC
  Administered 2019-06-05 – 2019-06-11 (×7): 6 via TOPICAL

## 2019-06-04 MED ORDER — PENTAFLUOROPROP-TETRAFLUOROETH EX AERO: 1.0000 "application " | INHALATION_SPRAY | CUTANEOUS | Status: DC | PRN

## 2019-06-04 MED ORDER — ACETAMINOPHEN 325 MG PO TABS: 650.0000 mg | ORAL_TABLET | Freq: Four times a day (QID) | ORAL | Status: DC | PRN

## 2019-06-04 MED ORDER — PREDNISOLONE ACETATE 1 % OP SUSP
1.0000 [drp] | Freq: Every day | OPHTHALMIC | Status: DC
  Administered 2019-06-05 – 2019-06-11 (×7): 1 [drp] via OPHTHALMIC
  Filled 2019-06-04: qty 5

## 2019-06-04 MED ORDER — ACYCLOVIR 400 MG PO TABS
400.0000 mg | ORAL_TABLET | Freq: Every day | ORAL | Status: DC
  Administered 2019-06-05 – 2019-06-11 (×7): 400 mg via ORAL
  Filled 2019-06-04 (×8): qty 1

## 2019-06-04 MED ORDER — PROCHLORPERAZINE 25 MG RE SUPP: 12.5000 mg | Freq: Four times a day (QID) | RECTAL | Status: DC | PRN

## 2019-06-04 MED ORDER — METOPROLOL SUCCINATE ER 25 MG PO TB24
12.5000 mg | ORAL_TABLET | Freq: Every day | ORAL | Status: DC
  Administered 2019-06-06 – 2019-06-11 (×6): 12.5 mg via ORAL
  Filled 2019-06-04 (×7): qty 1

## 2019-06-04 MED ORDER — DORZOLAMIDE HCL-TIMOLOL MAL 2-0.5 % OP SOLN
1.0000 [drp] | Freq: Two times a day (BID) | OPHTHALMIC | Status: DC
  Administered 2019-06-04 – 2019-06-11 (×14): 1 [drp] via OPHTHALMIC
  Filled 2019-06-04: qty 10

## 2019-06-04 MED ORDER — DIPHENHYDRAMINE HCL 12.5 MG/5ML PO ELIX: 12.5000 mg | ORAL_SOLUTION | Freq: Four times a day (QID) | ORAL | Status: DC | PRN

## 2019-06-04 MED ORDER — CALCIUM CARBONATE ANTACID 500 MG PO CHEW: 1.0000 | CHEWABLE_TABLET | Freq: Three times a day (TID) | ORAL | Status: DC | PRN

## 2019-06-04 MED ORDER — SIMETHICONE 80 MG PO CHEW: 80.0000 mg | CHEWABLE_TABLET | Freq: Four times a day (QID) | ORAL | Status: DC | PRN

## 2019-06-04 MED ORDER — TRAZODONE HCL 50 MG PO TABS: 25.0000 mg | ORAL_TABLET | Freq: Every evening | ORAL | Status: DC | PRN

## 2019-06-04 MED ORDER — ACETAMINOPHEN 325 MG PO TABS: 325.0000 mg | ORAL_TABLET | ORAL | Status: DC | PRN

## 2019-06-04 MED ORDER — BISACODYL 10 MG RE SUPP: 10.0000 mg | Freq: Every day | RECTAL | Status: DC | PRN

## 2019-06-04 MED ORDER — PROCHLORPERAZINE EDISYLATE 10 MG/2ML IJ SOLN: 5.0000 mg | Freq: Four times a day (QID) | INTRAMUSCULAR | Status: DC | PRN

## 2019-06-04 MED ORDER — POLYETHYLENE GLYCOL 3350 17 G PO PACK
17.0000 g | PACK | Freq: Every day | ORAL | Status: DC | PRN
  Administered 2019-06-06: 09:00:00 17 g via ORAL
  Filled 2019-06-04: qty 1

## 2019-06-04 NOTE — H&P (Signed)
Physical Medicine and Rehabilitation Admission H&P    CC: Anoxic BI   HPI: Kipton Skillen is a 64 year old male with history of OSA, retinopathy - getting injections in his eyes, NICM, VT/A flutter s/p ablation 04/2019, ESRD with HD MWF who was admitted on 05/29/19 for AV fistula v/s graft placement due to problems with piror access. Prior to initiation of procedure, patient sustained cardiac arrest requiring CPR X 4 mintues with ACLS protocol prior to ROSC.  Arrest felt to be respiratory in nature due to sedatives and rapid decline in compromised RV function as bedside echo reported to showglobal hypokinesis with EF 20%.  . He underwent HD to optimize volume status.  He had issues with agitation requiring sedation. EEG showed diffuse encephalopathy. CT head showed generalized volume loss without acute changes. He tolerated extubation on 01/30 but continued to have cognitive deficits with slurred speech.  MRI brain showed mild brain atrophy and no signs of anoxic injury. He has had issues with agitation as well as cognitive deficits with delayed responses, poor safety awareness, fatigue and unsteady gait affecting mobility and ADLs. CIR recommended due to anoxic BI.    Cardiology consulted today and recommended addition of BB for runs of NSVT and EP consulted for input on ICD or life vest.    Review of Systems  Constitutional: Negative for chills and fever.  HENT: Negative for hearing loss and tinnitus.   Eyes: Positive for blurred vision.  Respiratory: Positive for shortness of breath (with minimal activity).   Cardiovascular: Negative for chest pain, palpitations and orthopnea.  Gastrointestinal: Negative for diarrhea, heartburn and nausea.  Genitourinary: Negative for dysuria.  Musculoskeletal: Negative for back pain, falls, joint pain, myalgias and neck pain.  Skin: Negative for rash.  Neurological: Positive for weakness. Negative for dizziness and headaches.    Psychiatric/Behavioral: The patient has insomnia (gets up and walks, snacks, etc).      Past Medical History:  Diagnosis Date  . Atrial arrhythmia    atrial tachycardia with variable AV conduction versus atypical aflutter 01/10/19, rate control (02/06/19)  . Cataract   . Dyspnea    on exertion  . ESRD (end stage renal disease) ([place])    Hemo- MWF, Polycystic kidney disease  . Fatigue   . History of kidney stones    removal of stone- cysto  . Hyperlipidemia   . Hyperparathyroidism, secondary renal ([place])   . Hypertension   . Hypoxemia 12/12/2013  . Nonischemic cardiomyopathy ([place])    Er 25% 2015, 55 % 2013  . OSA on CPAP    no longer using cpap  . OSA on CPAP 03/24/2014  . Pneumonia    2015ish  . Ventricular tachycardia//Freq PVCs   . Wears glasses     Past Surgical History:  Procedure Laterality Date  . A-FLUTTER ABLATION N/A 04/11/2019   Procedure: A-FLUTTER ABLATION;  Surgeon: Evans Lance, MD;  Location: [place] [facility];  Service: Cardiovascular;  Laterality: N/A;  . A/V FISTULAGRAM Left 04/27/2017   Procedure: A/V FISTULAGRAM;  Surgeon: Conrad Mount Hope, MD;  Location: [facility];  Service: Cardiovascular;  Laterality: Left;  lt arm  . A/V FISTULAGRAM Left 01/10/2019   Procedure: A/V FISTULAGRAM;  Surgeon: Marty Heck, MD;  Location: [facility];  Service: Cardiovascular;  Laterality: Left;  . APPENDECTOMY    . AV FISTULA PLACEMENT  12/05/2011   Procedure: ARTERIOVENOUS (AV) FISTULA CREATION;LLEFT ARM  Surgeon: Conrad Milledgeville, MD;  Location[facility];  Service: Vascular;  Laterality: Left;  RADIO-CEPHALIC  fistula left arm  . AV FISTULA PLACEMENT  01/11/2012   Procedure: ARTERIOVENOUS (AV) FISTULA CREATION;  Surgeon: Conrad Monee, MD;  Location: [facility];  Service: Vascular;  Laterality: Left;  Creation of left brachial cephalic arteriovenous fistula  . AV FISTULA PLACEMENT Right 03/07/2019   Procedure: ARTERIOVENOUS (AV) FISTULA CREATION  RIGHT ARM;   Surgeon: Marty Heck, MD;  Location: [facility] [facility];  Service: Vascular;  Laterality: Right;  . BASCILIC VEIN TRANSPOSITION Left 12/27/2016   Procedure: BASILIC VEIN TRANSPOSITION LEFT UPPER ARM FIRST STAGE;  Surgeon: Conrad Annapolis, MD;  Location: [facility];  Service: Vascular;  Laterality: Left;  . BASCILIC VEIN TRANSPOSITION Left 01/31/2017   Procedure: LEFT ARM BASILIC VEIN TRANSPOSITION, SECOND STAGE;  Surgeon: Conrad Derry, MD;  Location: [facility];  Service: Vascular;  Laterality: Left;  . CARDIAC CATHETERIZATION  04-05-2010   checking for blockage but none-[facility]  . COLONOSCOPY    . CYSTOSCOPY W/ STONE MANIPULATION     "laser once" (01/22/2013)  . HEMATOMA EVACUATION Left 05/09/2017   Procedure: EVACUATION HEMATOMA LEFT ARM;  Surgeon: Conrad Vineyard, MD;  Location: [facility];  Service: Vascular;  Laterality: Left;  . HERNIA REPAIR    . INGUINAL HERNIA REPAIR Right 11/06/2015   Procedure: OPEN HERNIA REPAIR  RIGHT INGUINAL ADULT;  Surgeon: [name], MD;  Location: [facility];  Service: General;  Laterality: Right;  with MESH  . INSERTION OF DIALYSIS CATHETER Right 10/05/2016   Procedure: INSERTION OF right internal jugular DIALYSIS CATHETER;  Surgeon: [name], MD;  Location: [facility];  Service: Vascular;  Laterality: Right;  . INSERTION OF MESH  03/20/2012   Procedure: INSERTION OF MESH;  UMB Surgeon: [name], MD;  Location: [facility];  Service: General;  Laterality: N/A;  . INSERTION OF MESH N/A 01/22/2013   Procedure: INSERTION OF MESH;  Surgeon: [name], MD;  Location: [facility];  Service: General;  Laterality: N/A;  . LAPAROTOMY  04/02/2012   Procedure: EXPLORATORY LAPAROTOMY;  Surgeon: [name], MD;  Location: [facility];  Service: General;  Laterality: N/A;  Exploratory Laparotomy with resection of small intestine  . LEFT HEART CATHETERIZATION WITH CORONARY ANGIOGRAM N/A 05/14/2013   Procedure: LEFT HEART CATHETERIZATION WITH CORONARY ANGIOGRAM;  Surgeon: Sinclair Grooms, MD;   Location: [facility] CATH [facility];  Service: Cardiovascular;  Laterality: N/A;  . LIGATION OF ARTERIOVENOUS  FISTULA Left 12/27/2016   Procedure: LIGATION/EXCISION OF LEFT UPPER ARM ARTERIOVENOUS  FISTULA;  EVACUATION OF HEMATOMA;  Surgeon: Conrad Alto, MD;  Location: [facility];  Service: Vascular;  Laterality: Left;  . LIGATION OF ARTERIOVENOUS  FISTULA Left 03/07/2019   Procedure: LIGATION OF ARTERIOVENOUS FISTULA  LEFT UPPER ARM;  Surgeon: Marty Heck, MD;  Location: [facility];  Service: Vascular;  Laterality: Left;  . REVISON OF ARTERIOVENOUS FISTULA Left 10/05/2016   Procedure: REVISON OF left arm ARTERIOVENOUS FISTULA;  Surgeon: [name], MD;  Location: [facility];  Service: Vascular;  Laterality: Left;  . TONSILLECTOMY    . UMBILICAL HERNIA REPAIR  03/20/2012   Procedure: HERNIA REPAIR UMBILICAL ADULT;  Surgeon: [name], MD;  Location: [facility];  Service: General;  Laterality: N/A;  . UMBILICAL HERNIA REPAIR  01/22/2013   preperitoneal open procedure due to significant adhesions/notes 01/22/2013  . VENTRAL HERNIA REPAIR N/A 01/22/2013   Procedure: ATTEMPTED LAPAROSCOPIC VENTRAL HERNIA CONVERTED TO OPEN;  Surgeon: [name], MD;  [facility];  Service: General;  Laterality: N/A;    Family History  Problem Relation Age of Onset  . Heart disease Mother   . Hyperlipidemia Mother   . Hypertension Mother   . Kidney disease Father   . Stroke Father   . Kidney disease Brother   . Amblyopia Neg Hx   . Blindness Neg Hx   . Cataracts Neg Hx   . Diabetes Neg Hx   . Glaucoma Neg Hx   . Macular degeneration Neg Hx   . Retinal detachment Neg Hx   . Strabismus Neg Hx   . Retinitis pigmentosa Neg Hx     Social History:  Married. Used to work on [place] and disabled due to renal disease. Sedentary. He reports that he has never smoked. He has never used smokeless tobacco. He reports that he does not drink alcohol or use drugs.    Allergies: No Known Allergies   Medications Prior to  Admission  Medication Sig Dispense Refill  . acyclovir (ZOVIRAX) 400 MG tablet Take 400 mg by mouth daily with lunch.     [name] aspirin EC 81 MG EC tablet Take 1 tablet (81 mg total) by mouth daily.    [name] atorvastatin (LIPITOR) 40 MG tablet Take 40 mg by mouth at bedtime.     . dorzolamide-timolol (COSOPT) 22.3-6.8 MG/ML ophthalmic solution Place 1 drop into both eyes 2 (two) times daily. 10 mL 10  . HYDROcodone-acetaminophen (NORCO) 5-325 MG tablet Take 1 tablet by mouth every 6 (six) hours as needed for moderate pain. (Patient not taking: Reported on 05/22/2019) 20 tablet 0  . [START ON 06/05/2019] metoprolol succinate (TOPROL-XL) 25 MG 24 hr tablet Take 0.5 tablets (12.5 mg total) by mouth daily. 15 tablet 0  . nitroGLYCERIN (NITROSTAT) 0.4 MG SL tablet Place 1 tablet (0.4 mg total) under the tongue every 5 (five) minutes x 3 doses as needed for chest pain. 30 tablet 12  . prednisoLONE acetate (PRED FORTE) 1 % ophthalmic suspension Place 1 drop into the right eye daily at 12 noon.     . sevelamer carbonate (RENVELA) 800 MG tablet Take 1,600 mg by mouth See admin instructions. Take 1600 mg with each meal      Drug Regimen Review  Drug regimen was reviewed and remains appropriate with no significant issues identified  Home: Home Living Family/patient expects to be discharged to:: Private residence Living Arrangements: Spouse/significant other, Children Available Help at Discharge: Family, Available 24 hours/day Type of Home: House Home Access: Stairs to enter [employer] of Steps: 4 Entrance Stairs-Rails: Right, Left Home Layout: One level Bathroom Shower/Tub: Chiropodist: Handicapped height Home Equipment: [place] - single point Additional Comments: per family/chart as pt unable to answer due to lethargy   Functional History: Prior Function Level of Independence: Independent Comments: retired, former Customer service manager, Geophysicist/field seismologist Status:    Mobility: Bed Mobility Overal bed mobility: Needs Assistance Bed Mobility: Supine to Sit, Sit to Supine Rolling: Mod assist Sidelying to sit: Mod assist Supine to sit: Min guard, HOB elevated Sit to supine: Min guard General bed mobility comments: min cues to initiate bed mobility Transfers Overall transfer level: Needs assistance Equipment used: Rolling walker (2 wheeled) Transfers: Sit to/from Stand Sit to Stand: Mod assist, Min assist General transfer comment: min A from edge of bed with cues for hand placement, mod A from toilet due to low surface Ambulation/Gait Ambulation/Gait assistance: Mod assist, +2 safety/equipment(chair follow/line management) Gait Distance (  Feet): 60 Feet(x1, 100'x1) Assistive device: Rolling walker (2 wheeled) Gait Pattern/deviations: Step-to pattern, Drifts right/left, Trunk flexed General Gait Details: pt with increased trunk flexion, can achieve full upright posture with verbal cues but has difficulty maintaining it, pt with vearing to the R especially with onset of fatigue, pt with desire to keep ambulating despite tripping over feet and running into obstacles and requiring more assist Gait velocity: dec Gait velocity interpretation: <1.8 ft/sec, indicate of risk for recurrent falls  ADL: ADL Overall ADL's : Needs assistance/impaired Eating/Feeding: Maximal assistance, Bed level Grooming: Maximal assistance, Bed level Upper Body Bathing: Maximal assistance, Sitting, Bed level Lower Body Bathing: Maximal assistance, +2 for physical assistance, Sitting/lateral leans, Sit to/from stand Upper Body Dressing : Maximal assistance, Sitting, Bed level Lower Body Dressing: Maximal assistance, +2 for physical assistance, Sitting/lateral leans, Sit to/from stand Toilet Transfer: Moderate assistance, Regular Toilet, RW, Ambulation, Grab bars, Cueing for safety, Cueing for sequencing Toilet Transfer Details (indicate cue type and reason): decreased carry over  of hand placement not to pull on walker when standing, use grab bar. decreased eccentric control with sitting Toileting- Clothing Manipulation and Hygiene: Moderate assistance Toileting - Clothing Manipulation Details (indicate cue type and reason): managing gown when sitting Functional mobility during ADLs: Minimal assistance, Moderate assistance, Rolling walker, Cueing for safety, Cueing for sequencing General ADL Comments: patient demonstrates increased arousal with improved functional mobility however still high fall risk due to lateral and posterior loss of balance, especially when turning the walker  Cognition: Cognition Overall Cognitive Status: Impaired/Different from baseline Orientation Level: Oriented to person, Oriented to place, Oriented to situation Cognition Arousal/Alertness: Awake/alert Behavior During Therapy: Impulsive Overall Cognitive Status: Impaired/Different from baseline Area of Impairment: Orientation, Memory, Safety/judgement, Problem solving Orientation Level: Time, Situation Memory: Decreased short-term memory Following Commands: Follows one step commands with increased time, Follows one step commands inconsistently Safety/Judgement: Decreased awareness of safety, Decreased awareness of deficits Problem Solving: Slow processing, Difficulty sequencing, Requires verbal cues, Requires tactile cues General Comments: patient more alert this session however still demonstrating cognitive deficits requiring multimodal cues   There were no vitals taken for this visit.  Physical Exam  Nursing note and vitals reviewed. Constitutional: He appears well-developed and well-nourished.  General: Alert and oriented x 3, No apparent distress HEENT: Head is normocephalic, atraumatic, PERRLA, EOMI, sclera anicteric, oral mucosa pink and moist, dentition intact, ext ear canals clear,  Neck: Supple without JVD or lymphadenopathy Heart: Reg rate and rhythm. No murmurs rubs or  gallops Chest: CTA bilaterally without wheezes, rales, or rhonchi; no distress Abdomen: Soft, non-tender, non-distended, bowel sounds positive. Extremities: No clubbing, cyanosis. Pulses are 2+ Skin: Clean and intact without signs of breakdown Neurological/MSK: He is alert. Oriented to self and place. Situation "heart attack"--needed cues to recall reason for admission. Slow to initiate but he was able to follow simple motor commands. Strength is grossly 5/5 in upper extremities and 4+/5 in lower extremities.  Musculoskeletal: 1+ pedal edema. Psych: Pt's affect is appropriate. Pt is cooperative   Results for orders placed or performed during the hospital encounter of 05/29/19 (from the past 48 hour(s))  Basic metabolic panel     Status: Abnormal   Collection Time: 06/02/19  8:06 PM  Result Value Ref Range   Sodium 137 135 - 145 mmol/L   Potassium 4.9 3.5 - 5.1 mmol/L   Chloride 94 (L) 98 - 111 mmol/L   CO2 25 22 - 32 mmol/L   Glucose, Bld 90 70 - 99 mg/dL  BUN 47 (H) 8 - 23 mg/dL   Creatinine, Ser 10.59 (H) 0.61 - 1.24 mg/dL   Calcium 9.1 8.9 - 10.3 mg/dL   GFR calc non Af Amer 5 (L) >60 mL/min   GFR calc Af Amer 5 (L) >60 mL/min   Anion gap 18 (H) 5 - 15    Comment: Performed at [facility] [address] [place], [place] [address]  Magnesium     Status: None   Collection Time: 06/02/19  8:06 PM  Result Value Ref Range   Magnesium 2.4 1.7 - 2.4 mg/dL    Comment: Performed at [facility] [address], [place], [place] [address]  CBC     Status: Abnormal   Collection Time: 06/03/19  5:52 AM  Result Value Ref Range   WBC 4.1 4.0 - 10.5 K/uL   RBC 4.80 4.22 - 5.81 MIL/uL   Hemoglobin 12.2 (L) 13.0 - 17.0 g/dL   HCT 41.2 39.0 - 52.0 %   MCV 85.8 80.0 - 100.0 fL   MCH 25.4 (L) 26.0 - 34.0 pg   MCHC 29.6 (L) 30.0 - 36.0 g/dL   RDW 18.8 (H) 11.5 - 15.5 %   Platelets 135 (L) 150 - 400 K/uL   nRBC 0.0 0.0 - 0.2 %    Comment: Performed at [facility],  [place] [address], [place], [place] [address]  Basic metabolic panel     Status: Abnormal   Collection Time: 06/03/19  5:52 AM  Result Value Ref Range   Sodium 139 135 - 145 mmol/L   Potassium 5.0 3.5 - 5.1 mmol/L   Chloride 94 (L) 98 - 111 mmol/L   CO2 26 22 - 32 mmol/L   Glucose, Bld 70 70 - 99 mg/dL   BUN 51 (H) 8 - 23 mg/dL   Creatinine, Ser 11.51 (H) 0.61 - 1.24 mg/dL   Calcium 9.4 8.9 - 10.3 mg/dL   GFR calc non Af Amer 4 (L) >60 mL/min   GFR calc Af Amer 5 (L) >60 mL/min   Anion gap 19 (H) 5 - 15    Comment: Performed at [facility], [place] [address] [place], [place] [address]  CBC     Status: Abnormal   Collection Time: 06/04/19  4:49 AM  Result Value Ref Range   WBC 3.9 (L) 4.0 - 10.5 K/uL   RBC 5.06 4.22 - 5.81 MIL/uL   Hemoglobin 12.9 (L) 13.0 - 17.0 g/dL   HCT 43.2 39.0 - 52.0 %   MCV 85.4 80.0 - 100.0 fL   MCH 25.5 (L) 26.0 - 34.0 pg   MCHC 29.9 (L) 30.0 - 36.0 g/dL   RDW 18.8 (H) 11.5 - 15.5 %   Platelets 128 (L) 150 - 400 K/uL    Comment: REPEATED TO VERIFY CONSISTENT WITH PREVIOUS RESULT    nRBC 0.0 0.0 - 0.2 %    Comment: Performed at [facility], [place] [address] [place], [place] [address]  Basic metabolic panel     Status: Abnormal   Collection Time: 06/04/19  4:49 AM  Result Value Ref Range   Sodium 137 135 - 145 mmol/L   Potassium 4.2 3.5 - 5.1 mmol/L   Chloride 98 98 - 111 mmol/L   CO2 26 22 - 32 mmol/L   Glucose, Bld 91 70 - 99 mg/dL   BUN 35 (H) 8 - 23 mg/dL   Creatinine, Ser 9.67 (H) 0.61 - 1.24 mg/dL  Calcium 8.8 (L) 8.9 - 10.3 mg/dL   GFR calc non Af Amer 5 (L) >60 mL/min   GFR calc Af Amer 6 (L) >60 mL/min   Anion gap 13 5 - 15    Comment: Performed at [facility] [address], [place], [place] [address]   ECHOCARDIOGRAM COMPLETE  Result Date: 06/04/2019   ECHOCARDIOGRAM REPORT   Patient Name:   Michael [name]. Date of Exam: 06/04/2019 Medical Rec #:  [id number]          Height:       66.0 in Accession #:    [id number]          Weight:       176.8 lb Date of Birth:  02/13/1956          BSA:          1.90 m Patient Age:    18 years           BP:           142/132 mmHg Patient Gender: M                  HR:           70 bpm. Exam Location:  Inpatient Procedure: 2D Echo, Cardiac Doppler and Color Doppler Indications:    Abnormal EKG, s/p Cardiac arrest  History:        Patient has prior history of Echocardiogram examinations, most                 recent 07/23/2013. Cardiomyopathy, Abnormal ECG,                 Arrythmias:Cardiac Arrest, Atrial Flutter and Vtach; Risk                 Factors:Hypertension and Dyslipidemia. ESRD.  Sonographer:    Dustin Flock Referring Phys: [id number] [place]  1. Left ventricular ejection fraction, by visual estimation, is <20%. The left ventricle has severely decreased function. There is no left ventricular hypertrophy.  2. Severely dilated left ventricular internal cavity size.  3. The left ventricle demonstrates global hypokinesis.  4. Left ventricular diastolic parameters are consistent with Grade II diastolic dysfunction (pseudonormalization).  5. Global right ventricle has normal systolic function.The right ventricular size is normal. No increase in right ventricular wall thickness.  6. Left atrial size was severely dilated.  7. Right atrial size was severely dilated.  8. The mitral valve is normal in structure. Trivial mitral valve regurgitation. No evidence of mitral stenosis.  9. The tricuspid valve is normal in structure. Tricuspid valve regurgitation is mild. 10. The aortic valve is tricuspid. Aortic valve regurgitation is mild. Moderate aortic valve sclerosis/calcification without any evidence of aortic stenosis. 11. The pulmonic valve was normal in structure. Pulmonic valve regurgitation is not visualized. 12. There is mild dilatation of the aortic root measuring 41 mm. 13. Normal pulmonary artery systolic pressure. 14. The inferior vena cava is dilated in size with >50%  respiratory variability, suggesting right atrial pressure of 8 mmHg. 15. The tricuspid regurgitant velocity is 2.04 m/s, and with an assumed right atrial pressure of 8 mmHg, the estimated right ventricular systolic pressure is normal at 24.6 mmHg. 16. Small pericardial effusion. 17. The pericardial effusion is posterior to the left ventricle. FINDINGS  Left Ventricle: Left ventricular ejection fraction, by visual estimation, is <20%. The left ventricle has severely decreased function. The left ventricle demonstrates global hypokinesis. The left ventricular internal  cavity size was severely dilated left ventricle. There is no left ventricular hypertrophy. Left ventricular diastolic parameters are consistent with Grade II diastolic dysfunction (pseudonormalization). Normal left atrial pressure. Right Ventricle: The right ventricular size is normal. No increase in right ventricular wall thickness. Global RV systolic function is has normal systolic function. The tricuspid regurgitant velocity is 2.04 m/s, and with an assumed right atrial pressure  of 8 mmHg, the estimated right ventricular systolic pressure is normal at 24.6 mmHg. Left Atrium: Left atrial size was severely dilated. Right Atrium: Right atrial size was severely dilated Pericardium: A small pericardial effusion is present. The pericardial effusion is posterior to the left ventricle. Mitral Valve: The mitral valve is normal in structure. Trivial mitral valve regurgitation. No evidence of mitral valve stenosis by observation. Tricuspid Valve: The tricuspid valve is normal in structure. Tricuspid valve regurgitation is mild. Aortic Valve: The aortic valve is tricuspid. Aortic valve regurgitation is mild. Aortic regurgitation PHT measures 650 msec. Moderate aortic valve sclerosis/calcification is present, without any evidence of aortic stenosis. Pulmonic Valve: The pulmonic valve was normal in structure. Pulmonic valve regurgitation is not visualized. Pulmonic  regurgitation is not visualized. Aorta: The aortic root, ascending aorta and aortic arch are all structurally normal, with no evidence of dilitation or obstruction and aortic dilatation noted. There is mild dilatation of the aortic root measuring 41 mm. Venous: The inferior vena cava is dilated in size with greater than 50% respiratory variability, suggesting right atrial pressure of 8 mmHg. IAS/Shunts: No atrial level shunt detected by color flow Doppler. There is no evidence of a patent foramen ovale. No ventricular septal defect is seen or detected. There is no evidence of an atrial septal defect.  LEFT VENTRICLE PLAX 2D LVIDd:         6.90 cm  Diastology LVIDs:         6.25 cm  LV e' lateral:   6.98 cm/s LV PW:         1.01 cm  LV E/e' lateral: 12.2 LV IVS:        1.17 cm  LV e' medial:    7.58 cm/s LVOT diam:     2.70 cm  LV E/e' medial:  11.3 LV SV:         50 ml LV SV Index:   25.44 LVOT Area:     5.73 cm  RIGHT VENTRICLE RV Basal diam:  3.20 cm RV S prime:     8.63 cm/s TAPSE (M-mode): 2.0 cm LEFT ATRIUM              Index       RIGHT ATRIUM           Index LA diam:        4.40 cm  2.32 cm/m  RA Area:     25.20 cm LA Vol (A2C):   116.0 ml 61.13 ml/m RA Volume:   82.60 ml  43.53 ml/m LA Vol (A4C):   118.0 ml 62.18 ml/m LA Biplane Vol: 124.0 ml 65.34 ml/m  AORTIC VALVE LVOT Vmax:   84.90 cm/s LVOT Vmean:  52.500 cm/s LVOT VTI:    0.156 m AI PHT:      650 msec  AORTA Ao Root diam: 4.10 cm MITRAL VALVE                        TRICUSPID VALVE MV Area (PHT): 6.54 cm  TR Peak grad:   16.6 mmHg MV PHT:        33.64 msec           TR Vmax:        214.00 cm/s MV Decel Time: 116 msec MV E velocity: 85.30 cm/s 103 cm/s  SHUNTS MV A velocity: 45.00 cm/s 70.3 cm/s Systemic VTI:  0.16 m MV E/A ratio:  1.90       1.5       Systemic Diam: 2.70 cm  Fransico Him MD Electronically signed by Fransico Him MD Signature Date/Time: 06/04/2019/3:40:46 PM    Final        Medical Problem List and Plan: 1.   Impaired mobility and ADLs secondary to cardiac debility  -patient may shower  -ELOS/Goals: modI in PT, OT, I in SLP in 10-12 days.   -Reviewed PT/OT notes: with impulsivity, requiring safety cues, gait deviations requiring MinA for ambulation 140 feet with RW. Desat to 87% one time during 2/2 session. Decreased step length and gait velocity.  2.  Antithrombotics: -DVT/anticoagulation:  Pharmaceutical: Coumadin and Heparin  -antiplatelet therapy: ASA 3. Pain Management: Tylenol prn  Denies pain.  4. Mood: LCSW to follow for evaluation and support.   -antipsychotic agents: N/A 5. Neuropsych: This patient is not fully capable of making decisions on his own behalf. Impulsive, requires safety cues.  6. Skin/Wound Care: routine pressure relief measures.  7. Fluids/Electrolytes/Nutrition: Strict I/O. Lytes with HD 8. ESRD: HD MWF at the end of the day to help with tolerance of therapy during the day.  9. H/o VT/ A flutter/NICM: Low dose BB added today.  10. Anemia of chronic disease: Monitor H/H.  2/2 Hgb uptrending: 12.9  11. Thrombocytopenia: Recheck CBC in am--monitor for further drop. 12. Retinopathy/Glaucoma?: Continue Cosopt bid with pred forte in right eye.    Bary Leriche, PA-C 06/04/2019  Reviewed AC and therapist notes. Wife provided additional history. I have personally performed a face to face diagnostic evaluation, including, but not limited to relevant history and physical exam findings, of this patient and developed relevant assessment and plan.  Additionally, I have reviewed and concur with the physician assistant's documentation above.  The patient's status has not changed. The original post admission physician evaluation remains appropriate, and any changes from the pre-admission screening or documentation from the acute chart are noted above.   Izora Ribas, MD 06/04/2019

## 2019-06-04 NOTE — Consult Note (Addendum)
Cardiology Consultation:   Patient ID: Michael [name]. [name]: [id number]; DOB: Sep 03, 1955  Admit date: 05/29/2019 Date of Consult: 06/04/2019  Primary Care Provider: [name], MD Primary Cardiologist: Michael Dawley, MD  Primary Electrophysiologist:  Michael Peru, MD    Patient Profile:   Michael [name]. is a 64 y.o. male with a hx of polycystic kidney disease, ESRD on HD, hyperlipidemia, hypertension, OSA, V. Tach, a flutter s/p catheter ablation 04/11/19, RBBB, nonischemic cardiomyopathy (EF 25 to 30% 2015), hyperparathyroidism who is being seen today for the evaluation of s/p cardiac arrest at the request of Michael [name].  History of Present Illness:   Michael [name] is followed by Michael [name] and previously seen by Michael [name].  He first saw heart care in 2015 and an admission for symptomatic, sustained VT. echo at that time showed EF 20 to 25%.  Area cath showed normal coronary arteries.  He had frequent ventricular ectopy and without cardiomyopathy was due to his PVCs.  He was started on amiodarone with improvement and had no further VT. AT follow-up repeat echo showed EF of 25 to 30%.  Nitrates were started. Planned to discuss ICD but patient was lost to follow-up.  The patient for Michael [name] again in October 2020 for evaluation of a flutter/tachycardia prior to fistula placement.  He underwent catheter ablation 04/11/2019 was doing well follow up. Post-catheter the patient was bradycardic and BB was held.   The patient was planned to undergo AV fistula vs graft Dr. Carlis Abbott 05/29/2019.  Preop relieved after anesthesia he became unresponsive and arrested and received 4 minutes of CPR, Epi x 1, with ROSC.  The monitor in the OR failed and the initial rhythm was unknown.  He was intubated and transferred to critical care. It was felt the arrest was secondary to respiratory failure due to sedation. Patient was continued on hemodialysis per nephrology. Patient was extubated on 06/01/2019 and  transferred to the medical floor. On 06/02/2019 strokelike symptoms were noted.  MRI of the brain was obtained which did not show any acute process. He was noted to still have slurred speech and left upper extremity weakness. PT/OT recommended inpatient rehab.  Inpatient rehab requested cardiac evaluation prior to admission.   Heart Pathway Score:     Past Medical History:  Diagnosis Date  . Atrial arrhythmia    atrial tachycardia with variable AV conduction versus atypical aflutter 01/10/19, rate control (02/06/19)  . Cataract   . Dyspnea    on exertion  . ESRD (end stage renal disease) ([place])    Hemo- MWF, Polycystic kidney disease  . Fatigue   . History of kidney stones    removal of stone- cysto  . Hyperlipidemia   . Hyperparathyroidism, secondary renal ([place])   . Hypertension   . Hypoxemia 12/12/2013  . Nonischemic cardiomyopathy ([place])    Er 25% 2015, 55 % 2013  . OSA on CPAP    no longer using cpap  . OSA on CPAP 03/24/2014  . Pneumonia    2015ish  . Ventricular tachycardia//Freq PVCs   . Wears glasses     Past Surgical History:  Procedure Laterality Date  . A-FLUTTER ABLATION N/A 04/11/2019   Procedure: A-FLUTTER ABLATION;  Surgeon: Michael Lance, MD;  Location: [place] [facility];  Service: Cardiovascular;  Laterality: N/A;  . A/V FISTULAGRAM Left 04/27/2017   Procedure: A/V FISTULAGRAM;  Surgeon: Michael [name] [place], MD;  Location: [place] [facility];  Service: Cardiovascular;  Laterality: Left;  lt arm  .  A/V FISTULAGRAM Left 01/10/2019   Procedure: A/V FISTULAGRAM;  Surgeon: Michael Heck, MD;  Location: [place] [facility];  Service: Cardiovascular;  Laterality: Left;  . APPENDECTOMY    . AV FISTULA PLACEMENT  12/05/2011   Procedure: ARTERIOVENOUS (AV) FISTULA CREATION;LLEFT ARM  Surgeon: Michael [name] [place], MD;  Location: [place];  Service: Vascular;  Laterality: Left;  RADIO-CEPHALIC  fistula left arm  . AV FISTULA PLACEMENT  01/11/2012   Procedure: ARTERIOVENOUS (AV) FISTULA  CREATION;  Surgeon: Michael [name] Muse, MD;  Location: [place];  Service: Vascular;  Laterality: Left;  Creation of left brachial cephalic arteriovenous fistula  . AV FISTULA PLACEMENT Right 03/07/2019   Procedure: ARTERIOVENOUS (AV) FISTULA CREATION  RIGHT ARM;  Surgeon: Michael Heck, MD;  Location: [place];  Service: Vascular;  Laterality: Right;  . BASCILIC VEIN TRANSPOSITION Left 12/27/2016   Procedure: BASILIC VEIN TRANSPOSITION LEFT UPPER ARM FIRST STAGE;  Surgeon: Michael [name] [place], MD;  Location: [facility];  Service: Vascular;  Laterality: Left;  . BASCILIC VEIN TRANSPOSITION Left 01/31/2017   Procedure: LEFT ARM BASILIC VEIN TRANSPOSITION, SECOND STAGE;  Surgeon: Michael [name] Bearden, MD;  Location: [place];  Service: Vascular;  Laterality: Left;  . CARDIAC CATHETERIZATION  04-05-2010   checking for blockage but none-[facility]  . COLONOSCOPY    . CYSTOSCOPY W/ STONE MANIPULATION     "laser once" (01/22/2013)  . HEMATOMA EVACUATION Left 05/09/2017   Procedure: EVACUATION HEMATOMA LEFT ARM;  Surgeon: Michael [name] Stuart, MD;  Location: [place];  Service: Vascular;  Laterality: Left;  . HERNIA REPAIR    . INGUINAL HERNIA REPAIR Right 11/06/2015   Procedure: OPEN HERNIA REPAIR  RIGHT INGUINAL ADULT;  Surgeon: Michael Hausen, MD;  Location: [facility];  Service: General;  Laterality: Right;  with MESH  . INSERTION OF DIALYSIS CATHETER Right 10/05/2016   Procedure: INSERTION OF right internal jugular DIALYSIS CATHETER;  Surgeon: Michael Posner, MD;  Location: [place];  Service: Vascular;  Laterality: Right;  . INSERTION OF MESH  03/20/2012   Procedure: INSERTION OF MESH;  UMB Surgeon: Michael Bookbinder, MD;  Location: [place];  Service: General;  Laterality: N/A;  . INSERTION OF MESH N/A 01/22/2013   Procedure: INSERTION OF MESH;  Surgeon: Michael Bookbinder, MD;  Location: [place];  Service: General;  Laterality: N/A;  . LAPAROTOMY  04/02/2012   Procedure: EXPLORATORY LAPAROTOMY;  Surgeon: Michael Bookbinder, MD;  Location: [place];  Service:  General;  Laterality: N/A;  Exploratory Laparotomy with resection of small intestine  . LEFT HEART CATHETERIZATION WITH CORONARY ANGIOGRAM N/A 05/14/2013   Procedure: LEFT HEART CATHETERIZATION WITH CORONARY ANGIOGRAM;  Surgeon: Sinclair Grooms, MD;  Location: [facility];  Service: Cardiovascular;  Laterality: N/A;  . LIGATION OF ARTERIOVENOUS  FISTULA Left 12/27/2016   Procedure: LIGATION/EXCISION OF LEFT UPPER ARM ARTERIOVENOUS  FISTULA;  EVACUATION OF HEMATOMA;  Surgeon: Michael [name] , MD;  Location: [place];  Service: Vascular;  Laterality: Left;  . LIGATION OF ARTERIOVENOUS  FISTULA Left 03/07/2019   Procedure: LIGATION OF ARTERIOVENOUS FISTULA  LEFT UPPER ARM;  Surgeon: Michael Heck, MD;  Location: [place];  Service: Vascular;  Laterality: Left;  . REVISON OF ARTERIOVENOUS FISTULA Left 10/05/2016   Procedure: REVISON OF left arm ARTERIOVENOUS FISTULA;  Surgeon: Michael Posner, MD;  Location: [place];  Service: Vascular;  Laterality: Left;  . TONSILLECTOMY    . UMBILICAL HERNIA REPAIR  03/20/2012   Procedure: HERNIA REPAIR UMBILICAL ADULT;  Surgeon: Michael Bookbinder, MD;  Location: [place];  Service: General;  Laterality: N/A;  . UMBILICAL HERNIA REPAIR  01/22/2013   preperitoneal open procedure due to significant adhesions/notes 01/22/2013  . VENTRAL HERNIA REPAIR N/A 01/22/2013   Procedure: ATTEMPTED LAPAROSCOPIC VENTRAL HERNIA CONVERTED TO OPEN;  Surgeon: Michael Bookbinder, MD;  Location: [place];  Service: General;  Laterality: N/A;     Home Medications:  Prior to Admission medications   Medication Sig Start Date End Date Taking? Authorizing Provider  acyclovir (ZOVIRAX) 400 MG tablet Take 400 mg by mouth daily with lunch.  11/16/17  Yes [provider]  aspirin EC 81 MG EC tablet Take 1 tablet (81 mg total) by mouth daily. 05/19/13  Yes Lendon Colonel, NP  atorvastatin (LIPITOR) 40 MG tablet Take 40 mg by mouth at bedtime.    Yes [provider]  dorzolamide-timolol  (COSOPT) 22.3-6.8 MG/ML ophthalmic solution Place 1 drop into both eyes 2 (two) times daily. 03/12/19  Yes [name], MD  nitroGLYCERIN (NITROSTAT) 0.4 MG SL tablet Place 1 tablet (0.4 mg total) under the tongue every 5 (five) minutes x 3 doses as needed for chest pain. 05/19/13  Yes Lendon Colonel, NP  prednisoLONE acetate (PRED FORTE) 1 % ophthalmic suspension Place 1 drop into the right eye daily at 12 noon.    Yes [provider]  sevelamer carbonate (RENVELA) 800 MG tablet Take 1,600 mg by mouth See admin instructions. Take 1600 mg with each meal   Yes [provider]  HYDROcodone-acetaminophen (NORCO) 5-325 MG tablet Take 1 tablet by mouth every 6 (six) hours as needed for moderate pain. Patient not taking: Reported on 05/22/2019 03/07/19   Dagoberto Ligas, PA-C    Inpatient Medications: Scheduled Meds: . acyclovir  400 mg Oral Q lunch  . aspirin EC  81 mg Oral Daily  . atorvastatin  40 mg Oral QHS  . Chlorhexidine Gluconate Cloth  6 each Topical Q0600  . dorzolamide-timolol  1 drop Both Eyes BID  . doxercalciferol  2 mcg Intravenous Q M,W,F-HD  . feeding supplement (NEPRO CARB STEADY)  237 mL Oral BID BM  . heparin  5,000 Units Subcutaneous Q8H  . mouth rinse  15 mL Mouth Rinse BID  . multivitamin  1 tablet Oral QHS  . pantoprazole  40 mg Oral Daily  . prednisoLONE acetate  1 drop Right Eye Q1200  . sevelamer carbonate  1,600 mg Oral TID WC  . sodium chloride flush  10-40 mL Intracatheter Q12H   Continuous Infusions: . sodium chloride     PRN Meds: sodium chloride, acetaminophen, albuterol, LORazepam, pentafluoroprop-tetrafluoroeth, senna, sodium chloride flush  Allergies:   No Known Allergies  Social History:   Social History   Socioeconomic History  . Marital status: Married    Spouse name: [name]  . Number of children: 3  . Years of education: 25  . Highest education level: Not on file  Occupational History    Comment: disabled  Tobacco Use   . Smoking status: Never Smoker  . Smokeless tobacco: Never Used  Substance and Sexual Activity  . Alcohol use: No    Alcohol/week: 0.0 standard drinks  . Drug use: No  . Sexual activity: Yes  Other Topics Concern  . Not on file  Social History Narrative   Patient is married [place]) and lives at home with his wife and children.   Patient has three children.   Patient is in disability.   Patient has a high  school education.   Patient is right-handed   Patient drinks very little soda.            Social Determinants of Health   Financial Resource Strain:   . Difficulty of Paying Living Expenses: Not on file  Food Insecurity:   . Worried About Charity fundraiser in the Last Year: Not on file  . Ran Out of Food in the Last Year: Not on file  Transportation Needs:   . Lack of Transportation (Medical): Not on file  . Lack of Transportation (Non-Medical): Not on file  Physical Activity:   . Days of Exercise per Week: Not on file  . Minutes of Exercise per Session: Not on file  Stress:   . Feeling of Stress : Not on file  Social Connections:   . Frequency of Communication with Friends and Family: Not on file  . Frequency of Social Gatherings with Friends and Family: Not on file  . Attends Religious Services: Not on file  . Active Member of Clubs or Organizations: Not on file  . Attends Archivist Meetings: Not on file  . Marital Status: Not on file  Intimate Partner Violence:   . Fear of Current or Ex-Partner: Not on file  . Emotionally Abused: Not on file  . Physically Abused: Not on file  . Sexually Abused: Not on file    Family History:   Family History  Problem Relation Age of Onset  . Heart disease Mother   . Hyperlipidemia Mother   . Hypertension Mother   . Kidney disease Father   . Stroke Father   . Kidney disease Brother   . Amblyopia Neg Hx   . Blindness Neg Hx   . Cataracts Neg Hx   . Diabetes Neg Hx   . Glaucoma Neg Hx   . Macular  degeneration Neg Hx   . Retinal detachment Neg Hx   . Strabismus Neg Hx   . Retinitis pigmentosa Neg Hx      ROS:  Please see the history of present illness.  All other ROS reviewed and negative.     Physical Exam/Data:   Vitals:   06/03/19 1201 06/03/19 2230 06/04/19 0641 06/04/19 0820  BP: (!) 143/76  139/75 128/76  Pulse: 68  75 83  Resp: 18     Temp: 97.9 F (36.6 C) 97.7 F (36.5 C) 97.9 F (36.6 C)   TempSrc: Oral  Oral   SpO2: 100%  100% 99%  Weight:   80.2 kg   Height:        Intake/Output Summary (Last 24 hours) at 06/04/2019 1046 Last data filed at 06/03/2019 2030 Gross per 24 hour  Intake 360 ml  Output -  Net 360 ml   Last 3 Weights 06/04/2019 06/03/2019 06/03/2019  Weight (lbs) 176 lb 12.9 oz 171 lb 1.2 oz 185 lb 10 oz  Weight (kg) 80.2 kg 77.6 kg 84.2 kg     Body mass index is 28.54 kg/m.  General:  Well nourished, well developed, in no acute distress HEENT: normal Lymph: no adenopathy Neck: no JVD Endocrine:  No thryomegaly Vascular: No carotid bruits; FA pulses 2+ bilaterally without bruits  Cardiac:  S1 and S2 with possible split S2; RRR; no murmur  Lungs:  clear to auscultation bilaterally, no wheezing, rhonchi or rales  Abd: soft, nontender, no hepatomegaly  Ext: no edema Musculoskeletal:  No deformities, BUE and BLE strength normal and equal Skin: warm and dry  Neuro:  CNs 2-12 intact, no focal abnormalities noted Psych:  Normal affect   EKG:  The EKG was personally reviewed and demonstrates:  NSR, RBBB, PRI 247ms, QRS 180ms Telemetry:  Telemetry was personally reviewed and demonstrates:  NSR with runs of NSVT, HR 70-80s; first degree AV block; PVCs  Relevant CV Studies:  Echo 2015 Study Conclusions   - Left ventricle: The cavity size was mildly dilated. Wall  thickness was normal. Systolic function was severely  reduced. The estimated ejection fraction was in the range  of 25% to 30%. There is akinesis of the posterior  myocardium.  There is severe hypokinesis of the lateral  myocardium. There is hypokinesis of the anterior  myocardium. Doppler parameters are consistent with  abnormal left ventricular relaxation (grade 1 diastolic  dysfunction).  - Aortic valve: Mild regurgitation.  - Aortic root: The aortic root was mildly dilated.  - Left atrium: The atrium was mildly dilated.    Laboratory Data:  High Sensitivity Troponin:   Recent Labs  Lab 05/29/19 1146 05/29/19 1628 05/29/19 1826 05/29/19 2151 05/30/19 0001  TROPONINIHS 85* 658* 977* 1,392* 1,582*     Chemistry Recent Labs  Lab 06/02/19 2006 06/03/19 0552 06/04/19 0449  NA 137 139 137  K 4.9 5.0 4.2  CL 94* 94* 98  CO2 25 26 26   GLUCOSE 90 70 91  BUN 47* 51* 35*  CREATININE 10.59* 11.51* 9.67*  CALCIUM 9.1 9.4 8.8*  GFRNONAA 5* 4* 5*  GFRAA 5* 5* 6*  ANIONGAP 18* 19* 13    Recent Labs  Lab 05/29/19 1628 06/01/19 0418 06/02/19 0450  PROT 5.8* 6.0*  --   ALBUMIN 3.1* 2.8* 2.6*  AST 22 38  --   ALT 11 6  --   ALKPHOS 34* 50  --   BILITOT 0.9 1.5*  --    Hematology Recent Labs  Lab 06/01/19 0500 06/03/19 0552 06/04/19 0449  WBC 6.1 4.1 3.9*  RBC 5.74 4.80 5.06  HGB 14.5 12.2* 12.9*  HCT 47.0 41.2 43.2  MCV 81.9 85.8 85.4  MCH 25.3* 25.4* 25.5*  MCHC 30.9 29.6* 29.9*  RDW 20.2* 18.8* 18.8*  PLT 170 135* 128*   BNPNo results for input(s): BNP, PROBNP in the last 168 hours.  DDimer No results for input(s): DDIMER in the last 168 hours.   Radiology/Studies:  MR BRAIN WO CONTRAST  Result Date: 06/02/2019 CLINICAL DATA:  Cardiac arrest with abnormal neurologic status subsequent. EXAM: MRI HEAD WITHOUT CONTRAST TECHNIQUE: Multiplanar, multiecho pulse sequences of the brain and surrounding structures were obtained without intravenous contrast. COMPARISON:  CT 05/29/2019 FINDINGS: Brain: Diffusion imaging does not show any acute or subacute infarction. No sign of anoxic brain injury. Brainstem and cerebellum are normal.  Cerebral hemispheres show mild age related atrophy without small or large vessel infarction. No mass lesion, hemorrhage, hydrocephalus or extra-axial collection. Vascular: Major vessels at the base of the brain show flow. Skull and upper cervical spine: Negative Sinuses/Orbits: Clear/normal Other: None IMPRESSION: Mild cerebral hemispheric brain atrophy for age. Otherwise normal examination. No old or acute small or large vessel infarction. No evidence of diffuse hypoxic ischemic injury. Electronically Signed   By: Nelson Chimes M.D.   On: 06/02/2019 12:54    Assessment and Plan:   S/P Cardiac Arrest/Suspect PEA arrest The patient went in for right AV fistula versus graft had cardiac arrest in the operating room 05/29/2019 before the surgery. He had received pre-medication of midazolam and fentanyl before event. He was suddenly unresponsive.  Monitor failed in OR so initial rhythm unknown. He had 4 minutes of CPR, epi x 1, before achieving ROSC. He was intubated and admitted to ICU.  He was extubated 06/01/2019.  -Suspected cardiac arrest was secondary to respiratory failure due to sedatives and rapid decline due to compromised RV function. Recovery from arrest correlated with intubation - Post arrest bedside echo showed dilated LV with thin wall, global hypokinesis with EF at 20%; no MR, trivial AI, PI, moderate TR RVSP 50 mm Hg, elevated filling pressures - Telemetry shows runs of NSVT>>would add BB, Toprol, and watch for bradycardia - Electrolytes stable - Patient denies CP, SOB, palpitations - MD to see  Nonischemic cardiomyopathy - In 2015 EF was 25 to 30%.  Normal coronary arteries on cath -Echo ordered -this should not change discharge plans, can be done in inpatient rehab. -Volume management per HD - Toprol as above - Also we will curbside consult EP for discussion on ICD vs life vest  ESRD on HD -MWF -Per nephrology  History of VT -Was previously on amiodarone>> unsure when he stopped  it - Has brief runs of of NSVT -We will ask our EP colleagues to opine on the use of beta-blocker versus amiodarone since he previously was on amiodarone.  For now we will simply start low-dose Toprol 25 mg daily.  A flutter s/p recent ablation  For questions or updates, please contact [place] Please consult [email or web address] for contact info under   Signed, Cadence Ninfa Meeker, PA-C  06/04/2019 10:46 AM    ATTENDING ATTESTATION  I have seen, examined and evaluated the patient along with Cadence Ninfa Meeker, PA-C .  After reviewing all the available data and chart, we discussed the patients laboratory, study & physical findings as well as symptoms in detail. I agree with her findings, examination as well as impression recommendations as per our discussion.    Attending adjustments noted in italics.   . Overall, I see no reason from a cardiac standpoint for him not to be transferred to inpatient rehab.  Questions to be asked are about his cardiomyopathy which has been gone untreated for about 5 years now.  The last cardiologist he saw was Michael [name] from electrophysiology.  He had a recent atrial flutter ablation, but there is no comment about beta-blocker or amiodarone.  It seems like his cardiac arrest was probably PEA from respiratory arrest and not true cardiac arrest.  He has been having some brief runs of NSVT which is not unexpected given his low EF.  Standard treatment will be to add Toprol (and with borderline pressures will start with 25 mg daily).  We will ask our EP colleagues to opine as to whether or not he should potentially be on amiodarone given the recent cardiac arrest.  We will also ask their opinion about.  Consideration of LifeVest.  He has been on LifeVest in the past and there was no follow-through with ICD.  At present, the patient would not be able to consent for ICD. None of these questions or decisions should have any bearing on him being transferred to inpatient rehab  where will be accessible for assistance as necessary.    Glenetta Hew, M.D., M.S. Interventional Cardiologist   Pager # [phone] Phone # [phone] [address] [place], [place] [address]

## 2019-06-04 NOTE — Anesthesia Postprocedure Evaluation (Signed)
Anesthesia Post Note  Patient: Michael [name].  Procedure(s) Performed: FISTULA SUPERFICIALIZATION VERSES ARTERIOVENOUS GRAFT (Right )     Patient location during evaluation: SICU Anesthesia Type: General Level of consciousness: obtunded/minimal responses and patient remains intubated per anesthesia plan Pain management: pain level controlled Vital Signs Assessment: post-procedure vital signs reviewed and stable Respiratory status: patient remains intubated per anesthesia plan Cardiovascular status: stable Postop Assessment: no apparent nausea or vomiting Anesthetic complications: yes Anesthetic complication details: anesthesia complicationsComments: respir               Kathee Tumlin

## 2019-06-04 NOTE — Progress Notes (Signed)
  Echocardiogram 2D Echocardiogram has been performed.  Michael [name] 06/04/2019, 3:17 PM

## 2019-06-04 NOTE — Discharge Summary (Addendum)
Physician Discharge Summary  Michael [name]. PJA:[id number] DOB: [birth year] DOA: 05/29/2019  PCP: [name], MD  Admit date: 05/29/2019 Discharge date: 06/04/2019  Admitted From: Home Disposition: CIR  Recommendations for Outpatient Follow-up:  1. Follow up with PCP in 1-2 weeks 2. Follow with vascular surgery as per their schedule 3. Please obtain BMP/CBC in one week 4. Please follow up on the following pending results:  Home Health: None Equipment/Devices: None  Discharge Condition: Stable CODE STATUS: Full code Diet recommendation: Cardiac  Subjective: Seen and examined.  No complaints.  Feels better.  Brief/Interim Summary: Michael [name], Michael [name]. is a 64 year old gentleman who was a scheduled to have right AV fistula versus graft done by vascular surgery on 05/28/2018 however unfortunately, prior to incision he had a cardiac arrest in the operating room.  Patient became hypotensive and hypoxic and ultimately got 4 minutes of CPR and 2 mg of AP ankle ROSC.  He was intubated.  He was then admitted in ICU.  Nephrology was consulted and patient continued to receive his hemodialysis.  Subsequently was extubated on 06/01/2019 and was transferred to medical floor under hospital service starting 06/02/2019. Patient seemed to have some slurred speech, right facial droop and left arm weakness concerning for stroke so MRI of the head was obtained which was unremarkable.  Subsequently was seen by PT OT and they recommended CIR which has been arranged for him.  Patient was observed to have bradycardia and tachycardia intermittently.  Couple of EKGs were done and he had wide QRS complex tachycardia and sometimes bradycardia.  Cardiology was consulted on last day of hospitalization and they called it in SVT and recommended Toprol-XL 12.5 mg twice daily.  Echo has been ordered which will be done in CIR.  ADDENDUM: Card initially recd toprol xl 12.5 mg po bid but then changed to 12.5 mg po daily. This  medication  Has been adjusted according to their recs.  Discharge Diagnoses:  Active Problems:   Status post creation of arteriovenous fistula   Cardiac arrest [facility])    Discharge Instructions  Discharge Instructions    Discharge patient   Complete by: As directed    Discharge disposition: [facility] Facility   Discharge patient date: 06/04/2019     Allergies as of 06/04/2019   No Known Allergies     Medication List    TAKE these medications   acyclovir 400 MG tablet Commonly known as: ZOVIRAX Take 400 mg by mouth daily with lunch.   aspirin 81 MG EC tablet Take 1 tablet (81 mg total) by mouth daily.   atorvastatin 40 MG tablet Commonly known as: LIPITOR Take 40 mg by mouth at bedtime.   dorzolamide-timolol 22.3-6.8 MG/ML ophthalmic solution Commonly known as: COSOPT Place 1 drop into both eyes 2 (two) times daily.   HYDROcodone-acetaminophen 5-325 MG tablet Commonly known as: Norco Take 1 tablet by mouth every 6 (six) hours as needed for moderate pain.   metoprolol succinate 25 MG 24 hr tablet Commonly known as: TOPROL-XL Take 0.5 tablets (12.5 mg total) by mouth daily. Start taking on: June 05, 2019   nitroGLYCERIN 0.4 MG SL tablet Commonly known as: NITROSTAT Place 1 tablet (0.4 mg total) under the tongue every 5 (five) minutes x 3 doses as needed for chest pain.   prednisoLONE acetate 1 % ophthalmic suspension Commonly known as: PRED FORTE Place 1 drop into the right eye daily at 12 noon.   sevelamer carbonate 800 MG tablet Commonly known as: RENVELA Take 1,600 mg  by mouth See admin instructions. Take 1600 mg with each meal      Follow-up Information    [name], MD Follow up in 1 week(s).   Specialty: Family Medicine Contact information: [place] [address] [phone]        Evans Lance, MD Follow up.   Specialty: Cardiology Why: 07/04/2019 @ 4:15PM Contact information: [address]  [place] [address] [phone]        Richardson Dopp T, PA-C Follow up.   Specialties: Cardiology, Physician Assistant Why: 06/25/2019 @ 2:15PM Contact information: [address] [name] [address] [phone]          No Known Allergies  Consultations: Cardiology, nephrology, PCCM   Procedures/Studies: CT HEAD WO CONTRAST  Result Date: 05/29/2019 CLINICAL DATA:  64 year old male status post intraoperative cardiac arrest today.  status post intraoperative cardiac arrest today. EXAM: CT HEAD WITHOUT CONTRAST TECHNIQUE: Contiguous axial images were obtained from the base of the skull through the vertex without intravenous contrast. COMPARISON:  Head CT 08/15/2008. FINDINGS: Brain: Cerebral volume loss since 2010 appears generalized. No midline shift, ventriculomegaly, mass effect, evidence of mass lesion, intracranial hemorrhage or evidence of cortically based acute infarction. Gray-white matter differentiation remains within normal limits. Vascular: Calcified atherosclerosis at the skull base. No suspicious intracranial vascular hyperdensity. Skull: No acute osseous abnormality identified. Sinuses/Orbits: Visualized paranasal sinuses and mastoids are stable and well pneumatized. Other: No acute orbit or scalp soft tissue findings. Disconjugate gaze. Partially visible fluid in the pharynx. The patient is intubated on the scalp view. IMPRESSION: Generalized cerebral volume loss since 2010 but otherwise noncontrast CT appearance of the brain is within normal limits. Electronically Signed   By: Genevie Ann M.D.   On: 05/29/2019 15:35   MR BRAIN WO CONTRAST  Result Date: 06/02/2019 CLINICAL DATA:  Cardiac arrest with abnormal neurologic status subsequent. EXAM: MRI HEAD WITHOUT CONTRAST TECHNIQUE: Multiplanar, multiecho pulse sequences of the brain and surrounding structures were obtained without intravenous contrast. COMPARISON:  CT 05/29/2019 FINDINGS: Brain: Diffusion imaging does not show any acute or subacute  infarction. No sign of anoxic brain injury. Brainstem and cerebellum are normal. Cerebral hemispheres show mild age related atrophy without small or large vessel infarction. No mass lesion, hemorrhage, hydrocephalus or extra-axial collection. Vascular: Major vessels at the base of the brain show flow. Skull and upper cervical spine: Negative Sinuses/Orbits: Clear/normal Other: None IMPRESSION: Mild cerebral hemispheric brain atrophy for age. Otherwise normal examination. No old or acute small or large vessel infarction. No evidence of diffuse hypoxic ischemic injury. Electronically Signed   By: Nelson Chimes M.D.   On: 06/02/2019 12:54   DG Chest Port 1 View  Result Date: 05/30/2019 CLINICAL DATA:  Intubation EXAM: PORTABLE CHEST 1 VIEW COMPARISON:  Radiograph 05/29/2019 FINDINGS: Endotracheal tube terminates in mid trachea, 4.2 cm from the carina. A dual-lumen right IJ catheter sheath terminates at the level of the right atrium. A left subclavian catheter terminates at the brachiocephalic-caval confluence. Esophageal temperature probe terminates in the lower thoracic esophagus. A transesophageal tube tip terminates below the level of imaging. Telemetry leads and pacer pads overlie the chest wall. Stable cardiomegaly and pulmonary vascular congestion. Small bilateral effusions with opacity in the left base likely reflecting some additional atelectatic change. No pneumothorax. No acute osseous abnormality. IMPRESSION: Stable cardiomegaly and pulmonary vascular congestion. Small bilateral pleural effusions with opacity in the left base likely reflecting some additional atelectatic change. Support devices as above. Electronically Signed  By: Lovena Le M.D.   On: 05/30/2019 06:40   DG Chest Port 1 View  Result Date: 05/29/2019 CLINICAL DATA:  Intubation and line placement. EXAM: PORTABLE CHEST 1 VIEW COMPARISON:  Chest x-ray dated January 29, 2018. FINDINGS: Endotracheal tube in good position with the tip  4.5 cm above the carina. Enteric tube entering the stomach with the tip below the field of few. Left subclavian central venous catheter with tip in the distal SVC. Tunneled right internal jugular dialysis catheter with tip in the right atrium. Unchanged moderate cardiomegaly. Pulmonary vascular congestion. Small bilateral pleural effusions. Left basilar atelectasis. No pneumothorax. No acute osseous abnormality. IMPRESSION: 1. Appropriately positioned lines and tubes. 2. Moderate cardiomegaly with pulmonary vascular congestion and small bilateral pleural effusions. Electronically Signed   By: Titus Dubin M.D.   On: 05/29/2019 14:56   EEG adult  Result Date: 05/29/2019 Lora Havens, MD     05/29/2019  5:55 PM Patient Name: Gino Garrabrant. [name]: [id number] Epilepsy Attending: Lora Havens Referring Physician/Provider: Dr Kipp Brood Date: 05/29/2019 Duration: 24.15 mins Patient history: 64yo M s/p cardiac arrest on TTM. EEG to evaluate for seizure Level of alertness: comatose AEDs during EEG study: propofol Technical aspects: This EEG study was done with scalp electrodes positioned according to the 10-20 International system of electrode placement. Electrical activity was acquired at a sampling rate of 500Hz  and reviewed with a high frequency filter of 70Hz  and a low frequency filter of 1Hz . EEG data were recorded continuously and digitally stored. DESCRIPTON: EEG showed continuous generalized background suppression. EEG was not reactive to tactile stimuli. Hyperventilation and photic stimulation were not performed. ABNORMALITY - Background suppression, generalized IMPRESSION: This study is suggestive of profound diffuse encephalopathy, non specific to etiology. No seizures or epileptiform discharges were seen throughout the recording. Priyanka Barbra Sarks   Overnight EEG with video  Result Date: 05/30/2019 Lora Havens, MD     05/31/2019  9:09 AM Patient Name: Westly Hinnant. [name]: [id number]  Epilepsy Attending: Lora Havens Referring Physician/Provider: Dr Kipp Brood Duration: 05/29/2019 1746 to 05/30/2019 1746  Patient history: 64yo M s/p cardiac arrest on TTM. EEG to evaluate for seizure  Level of alertness: comatose  AEDs during EEG study: propofol  Technical aspects: This EEG study was done with scalp electrodes positioned according to the 10-20 International system of electrode placement. Electrical activity was acquired at a sampling rate of 500Hz  and reviewed with a high frequency filter of 70Hz  and a low frequency filter of 1Hz . EEG data were recorded continuously and digitally stored.  DESCRIPTON: EEG initially showed continuous generalized background suppression. Gradually EEG showed continuous generalized 3-6Hz  theta-delta slowing. EG was not reactive to tactile stimuli. Hyperventilation and photic stimulation were not performed.  ABNORMALITY - Background suppression, generalized - Continuous slowing, generalized  IMPRESSION: This study is suggestive of profound diffuse encephalopathy, non specific to etiology. No seizures or epileptiform discharges were seen throughout the recording.  Lora Havens   ECHOCARDIOGRAM COMPLETE  Result Date: 06/04/2019   ECHOCARDIOGRAM REPORT   Patient Name:   Luccas Towell. Date of Exam: 06/04/2019 Medical Rec #:  [id number]          Height:       66.0 in Accession #:    [id number]         Weight:       176.8 lb Date of Birth:  [birth year]          BSA:  1.90 m Patient Age:    83 years           BP:           142/132 mmHg Patient Gender: M                  HR:           70 bpm. Exam Location:  Inpatient Procedure: 2D Echo, Cardiac Doppler and Color Doppler Indications:    Abnormal EKG, s/p Cardiac arrest  History:        Patient has prior history of Echocardiogram examinations, most                 recent 07/23/2013. Cardiomyopathy, Abnormal ECG,                 Arrythmias:Cardiac Arrest, Atrial Flutter and Vtach; Risk                  Factors:Hypertension and Dyslipidemia. ESRD.  Sonographer:    Dustin Flock Referring Phys: [id number] [place]  1. Left ventricular ejection fraction, by visual estimation, is <20%. The left ventricle has severely decreased function. There is no left ventricular hypertrophy.  2. Severely dilated left ventricular internal cavity size.  3. The left ventricle demonstrates global hypokinesis.  4. Left ventricular diastolic parameters are consistent with Grade II diastolic dysfunction (pseudonormalization).  5. Global right ventricle has normal systolic function.The right ventricular size is normal. No increase in right ventricular wall thickness.  6. Left atrial size was severely dilated.  7. Right atrial size was severely dilated.  8. The mitral valve is normal in structure. Trivial mitral valve regurgitation. No evidence of mitral stenosis.  9. The tricuspid valve is normal in structure. Tricuspid valve regurgitation is mild. 10. The aortic valve is tricuspid. Aortic valve regurgitation is mild. Moderate aortic valve sclerosis/calcification without any evidence of aortic stenosis. 11. The pulmonic valve was normal in structure. Pulmonic valve regurgitation is not visualized. 12. There is mild dilatation of the aortic root measuring 41 mm. 13. Normal pulmonary artery systolic pressure. 14. The inferior vena cava is dilated in size with >50% respiratory variability, suggesting right atrial pressure of 8 mmHg. 15. The tricuspid regurgitant velocity is 2.04 m/s, and with an assumed right atrial pressure of 8 mmHg, the estimated right ventricular systolic pressure is normal at 24.6 mmHg. 16. Small pericardial effusion. 17. The pericardial effusion is posterior to the left ventricle. FINDINGS  Left Ventricle: Left ventricular ejection fraction, by visual estimation, is <20%. The left ventricle has severely decreased function. The left ventricle demonstrates global hypokinesis. The left ventricular internal  cavity size was severely dilated left ventricle. There is no left ventricular hypertrophy. Left ventricular diastolic parameters are consistent with Grade II diastolic dysfunction (pseudonormalization). Normal left atrial pressure. Right Ventricle: The right ventricular size is normal. No increase in right ventricular wall thickness. Global RV systolic function is has normal systolic function. The tricuspid regurgitant velocity is 2.04 m/s, and with an assumed right atrial pressure  of 8 mmHg, the estimated right ventricular systolic pressure is normal at 24.6 mmHg. Left Atrium: Left atrial size was severely dilated. Right Atrium: Right atrial size was severely dilated Pericardium: A small pericardial effusion is present. The pericardial effusion is posterior to the left ventricle. Mitral Valve: The mitral valve is normal in structure. Trivial mitral valve regurgitation. No evidence of mitral valve stenosis by observation. Tricuspid Valve: The tricuspid valve is normal in structure. Tricuspid valve regurgitation is  mild. Aortic Valve: The aortic valve is tricuspid. Aortic valve regurgitation is mild. Aortic regurgitation PHT measures 650 msec. Moderate aortic valve sclerosis/calcification is present, without any evidence of aortic stenosis. Pulmonic Valve: The pulmonic valve was normal in structure. Pulmonic valve regurgitation is not visualized. Pulmonic regurgitation is not visualized. Aorta: The aortic root, ascending aorta and aortic arch are all structurally normal, with no evidence of dilitation or obstruction and aortic dilatation noted. There is mild dilatation of the aortic root measuring 41 mm. Venous: The inferior vena cava is dilated in size with greater than 50% respiratory variability, suggesting right atrial pressure of 8 mmHg. IAS/Shunts: No atrial level shunt detected by color flow Doppler. There is no evidence of a patent foramen ovale. No ventricular septal defect is seen or detected. There is no  evidence of an atrial septal defect.  LEFT VENTRICLE PLAX 2D LVIDd:         6.90 cm  Diastology LVIDs:         6.25 cm  LV e' lateral:   6.98 cm/s LV PW:         1.01 cm  LV E/e' lateral: 12.2 LV IVS:        1.17 cm  LV e' medial:    7.58 cm/s LVOT diam:     2.70 cm  LV E/e' medial:  11.3 LV SV:         50 ml LV SV Index:   25.44 LVOT Area:     5.73 cm  RIGHT VENTRICLE RV Basal diam:  3.20 cm RV S prime:     8.63 cm/s TAPSE (M-mode): 2.0 cm LEFT ATRIUM              Index       RIGHT ATRIUM           Index LA diam:        4.40 cm  2.32 cm/m  RA Area:     25.20 cm LA Vol (A2C):   116.0 ml 61.13 ml/m RA Volume:   82.60 ml  43.53 ml/m LA Vol (A4C):   118.0 ml 62.18 ml/m LA Biplane Vol: 124.0 ml 65.34 ml/m  AORTIC VALVE LVOT Vmax:   84.90 cm/s LVOT Vmean:  52.500 cm/s LVOT VTI:    0.156 m AI PHT:      650 msec  AORTA Ao Root diam: 4.10 cm MITRAL VALVE                        TRICUSPID VALVE MV Area (PHT): 6.54 cm             TR Peak grad:   16.6 mmHg MV PHT:        33.64 msec           TR Vmax:        214.00 cm/s MV Decel Time: 116 msec MV E velocity: 85.30 cm/s 103 cm/s  SHUNTS MV A velocity: 45.00 cm/s 70.3 cm/s Systemic VTI:  0.16 m MV E/A ratio:  1.90       1.5       Systemic Diam: 2.70 cm  Fransico Him MD Electronically signed by Fransico Him MD Signature Date/Time: 06/04/2019/3:40:46 PM    Final    Intravitreal Injection, Pharmacologic Agent - OS - Left Eye  Result Date: 05/16/2019 Time Out 05/16/2019. 9:36 AM. Confirmed correct patient, procedure, site, and patient consented. Anesthesia Topical anesthesia was used. Anesthetic medications included Lidocaine 2%, Proparacaine 0.5%.  Procedure Preparation included 5% betadine to ocular surface, eyelid speculum. A 30 gauge needle was used. Injection: 1.25 mg Bevacizumab (AVASTIN) SOLN   NDC: 94854-627-03, Lot: [id number] , Expiration date: 07/31/2019   Route: Intravitreal, Site: Left Eye, Waste: 0 mL Post-op Post injection exam found visual acuity of at least  counting fingers. The patient tolerated the procedure well. There were no complications. The patient received written and verbal post procedure care education.   OCT, Retina - OU - Both Eyes  Result Date: 05/16/2019 Right Eye Quality was good. Central Foveal Thickness: 241. Progression has been stable. Findings include normal foveal contour, no SRF, no IRF, vitreomacular adhesion , inner retinal atrophy (Inner retinal atrophy IT quad caught on widefield). Left Eye Quality was good. Central Foveal Thickness: 259. Progression has improved. Findings include normal foveal contour, no SRF, epiretinal membrane, vitreomacular adhesion , outer retinal atrophy, intraretinal hyper-reflective material, intraretinal fluid (Mild persistent cystic changes IN macula, interval improvement in ellipsoid signal and IRHM). 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 CME - cystoid macular edema. PED - pigment epithelial detachment. IRF - intraretinal fluid. SRF - subretinal fluid. EZ - ellipsoid zone. ERM - epiretinal membrane. ORA - outer retinal atrophy. ORT - outer retinal tubulation. SRHM - subretinal hyper-reflective material    Discharge Exam: Vitals:   06/04/19 1345 06/04/19 1446  BP: (!) 124/94 (!) 142/132  Pulse: 69 70  Resp:    Temp:  99.3 F (37.4 C)  SpO2:  98%   Vitals:   06/04/19 0641 06/04/19 0820 06/04/19 1345 06/04/19 1446  BP: 139/75 128/76 (!) 124/94 (!) 142/132  Pulse: 75 83 69 70  Resp:      Temp: 97.9 F (36.6 C)   99.3 F (37.4 C)  TempSrc: Oral   Oral  SpO2: 100% 99%  98%  Weight: 80.2 kg     Height:        General: Pt is alert, awake, not in acute distress Cardiovascular: RRR, S1/S2 +, no rubs, no  gallops Respiratory: CTA bilaterally, no wheezing, no rhonchi Abdominal: Soft, NT, ND, bowel sounds + Extremities: no edema, no cyanosis    The results of significant diagnostics from this hospitalization (including imaging, microbiology, ancillary and laboratory) are listed below for reference.     Microbiology: No results found for this or any previous visit (from the past 240 hour(s)).   Labs: BNP (last 3 results) No results for input(s): BNP in the last 8760 hours. Basic Metabolic Panel: Recent Labs  Lab 05/30/19 0445 05/30/19 1654 06/01/19 0418 06/02/19 0450 06/02/19 2006 06/03/19 0552 06/04/19 0449  NA 139   < > 139 137 137 139 137  K 4.5   < > 4.9 4.6 4.9 5.0 4.2  CL 100   < > 97* 96* 94* 94* 98  CO2 25   < > 25 26 25 26 26   GLUCOSE 64*   < > 64* 90 90 70 91  BUN 28*   < > 35* 44* 47* 51* 35*  CREATININE 7.54*   < > 8.09* 9.71* 10.59* 11.51* 9.67*  CALCIUM 8.8*   < > 9.1 9.1 9.1 9.4 8.8*  MG 2.2  --   --   --  2.4  --   --  PHOS 5.8*  --   --  10.4*  --   --   --    < > = values in this interval not displayed.   Liver Function Tests: Recent Labs  Lab 05/29/19 1628 06/01/19 0418 06/02/19 0450  AST 22 38  --   ALT 11 6  --   ALKPHOS 34* 50  --   BILITOT 0.9 1.5*  --   PROT 5.8* 6.0*  --   ALBUMIN 3.1* 2.8* 2.6*   No results for input(s): LIPASE, AMYLASE in the last 168 hours. No results for input(s): AMMONIA in the last 168 hours. CBC: Recent Labs  Lab 05/29/19 1628 05/29/19 1810 05/30/19 0445 05/30/19 0445 05/31/19 0639 06/01/19 0338 06/01/19 0500 06/03/19 0552 06/04/19 0449  WBC 6.9   < > 4.6  --  4.8  --  6.1 4.1 3.9*  NEUTROABS 5.8  --   --   --   --   --   --   --   --   HGB 14.4   < > 14.2   < > 13.5 16.0 14.5 12.2* 12.9*  HCT 48.9   < > 46.0   < > 42.9 47.0 47.0 41.2 43.2  MCV 86.4   < > 82.7  --  80.8  --  81.9 85.8 85.4  PLT 179   < > 163  --  150  --  170 135* 128*   < > = values in this interval not displayed.   Cardiac  Enzymes: No results for input(s): CKTOTAL, CKMB, CKMBINDEX, TROPONINI in the last 168 hours. BNP: Invalid input(s): POCBNP CBG: Recent Labs  Lab 06/01/19 0416 06/01/19 0657 06/01/19 0809 06/01/19 1150 06/01/19 1533  GLUCAP 73 79 81 85 92   D-Dimer No results for input(s): DDIMER in the last 72 hours. Hgb A1c No results for input(s): HGBA1C in the last 72 hours. Lipid Profile No results for input(s): CHOL, HDL, LDLCALC, TRIG, CHOLHDL, LDLDIRECT in the last 72 hours. Thyroid function studies No results for input(s): TSH, T4TOTAL, T3FREE, THYROIDAB in the last 72 hours.  Invalid input(s): FREET3 Anemia work up No results for input(s): VITAMINB12, FOLATE, FERRITIN, TIBC, IRON, RETICCTPCT in the last 72 hours. Urinalysis    Component Value Date/Time   COLORURINE YELLOW 04/01/2012 1311   APPEARANCEUR CLOUDY (A) 04/01/2012 1311   LABSPEC 1.016 04/01/2012 1311   PHURINE 5.0 04/01/2012 1311   GLUCOSEU NEGATIVE 04/01/2012 1311   HGBUR NEGATIVE 04/01/2012 1311   BILIRUBINUR SMALL (A) 04/01/2012 1311   KETONESUR 15 (A) 04/01/2012 1311   PROTEINUR 100 (A) 04/01/2012 1311   UROBILINOGEN 0.2 04/01/2012 1311   NITRITE NEGATIVE 04/01/2012 1311   LEUKOCYTESUR TRACE (A) 04/01/2012 1311   Sepsis Labs Invalid input(s): PROCALCITONIN,  WBC,  LACTICIDVEN Microbiology No results found for this or any previous visit (from the past 240 hour(s)).   Time coordinating discharge: Over 30 minutes  SIGNED:   Darliss Cheney, MD  [facility] 06/04/2019, 3:47 PM  If 7PM-7AM, please contact night-coverage [email or web address]

## 2019-06-04 NOTE — Consult Note (Addendum)
Cardiology Consultation:   Patient ID: [name] [name]: [id number]; DOB: 1955/09/02  Admit date: 05/29/2019 Date of Consult: 06/04/2019  Primary Care Provider: [name], MD Primary Cardiologist: Ena Dawley, MD  Primary Electrophysiologist:  Cristopher Peru, MD    Patient Profile:   Saurabh Hettich. is a 64 y.o. male with a hx of HTN, HLD, PSKD, ESFR/HD, hyperparathyroidism, NICM who is being seen today for the evaluation of cardiomyopathy, cardiac arrest (suspect to have been PEA) at the request of Dr. Ellyn Hack.  History of Present Illness:   Mr. Worton has h/o a hospitalization Jan 2015  2/2 VT, LVEF markedly different/reduced from normal to 25-30%, treated with amiodarone and LHC had widely patent coronaries, planned for life vest and medical management of cardiomyopathy. F/u echo without improvement, Dr. Caryl Comes planned for additional med mgmt with hydralazine and nitrates at his visit in Jun 2015 He was lost to EP/cardiology follow up at this point until Oct 2020 he was referred to Dr. Lovena Le from Dr. Ola Spurr for new onset AFlutter/ATach noted on a pre-op EKG (pending fistula creation).  He planned addition of BB for what he felt was an AT, mention h/o VT/PVCs asymptomatic and without issues. His BP unable to tolerate BB and ultimately underwent CTI ablation or an atrial flutter 04/11/2019.  Observed sinus node dysfunction post ablation, his BB held and had improvement in his rate at his f/u 05/21/2019.  He was brought to [facility] 05/29/2019 to undergo right AVF vs graft.  Ultimately prior to incision he had a cardiac arrest in the operating room.  Initially MAC anesthesia and then the patient became hypoxic and while anesthesia was converting to LMA patient became hypotensive and could not feel a pulse.  Ultimately got 4 minutes of CPR and 2 mg of epi until ROSC Felt to have been respiratory driven Anesthesiology notes: The patient had a rhythm, consistent with their baseline,  on EKG, blood pressure reading, but the oxygen saturation was not reading, the plethysmography wave was poor, there was no EtCO2 present on the monitor, and due to surgical drapes, I was unable to see if the patient's chest was rising with ventilation.  As I reached the anesthetic machine, I noticed the primary gas oxygen was not turned on and no gas (oxygen or air) flow was being delivered to the LMA via the connected anesthetic circuit. I immediately turned on the oxygen, began to check the circuit connectivity, and found that the capnography sample line was not connected to the anesthetic machine. Once connected, EtCO2 was present by capnography and I pulled back the drapes to visualize chest rise with ventilation. With a continued poor plethysmograph, I asked the CRNA to check the pulse ox and work to obtain better monitors. The BP cuff was cycled while the surgeon and I felt for a pulse. The surgeon reported palpation of a "thready pulse" in the patient's right upper extremity. Suspecting hypotension, I opened up the IV fluids, gave vasopressors, and continued to work on our monitors. While evaluating for improvement in oxygenation and perfusion, the anesthetic monitor went black, resetting, and not displaying vitals for ~ minute. With the monitor functional, no immediate improvement in plethysmography waveform was seen and the blood pressure reported unreadable prompting me to again check for a left femoral and then right carotid pulse. Unable to palpate a pulse I began chest compressions at 1116  and 38m epinephrine was given for management of PEA. OR Code Blue was called, and help was obtained. During chest  compressions a leak from the LMA was audible and out of concern hypoxia contributed to the cardiac arrest, I ask a responding CRNA exchanged the LMA for an ETT. This was done successfully without interruption of chest compressions. At three minutes, a second dose (26m) of Epinephrine was given. Pulse  check at 4 minutes showed the patient had return of spontaneous circulation  The patient was cooled > rewarmed 06/01/19 and extubated 06/02/19 Confused, R sided facial droop and L sided weakness > MRI brain negative and plans for CIR begun.  Rehab requested cardiac evaluation prior to discharge to rehab.  Dr. HEllyn Hacksaw the patient today noted some NSVT on telemetry and started on low dose Toprol, he did not feel any further in-patient cardiac w/u was required and ok to discharge to rehab and asked given his history of CM and VT to have EP weigh in, though should not delay his transfer/admission to rehab.   The patient is feeling well currently.  He mentions that he has to use oxygen at dialysis to keep his BP from getting low, his daughter at bedside also reports known OSA though he does not use his CPAP. He denies CP, palpitations, or active SOB. Reports h/o syncope "a while back", but has no specifics.       Heart Pathway Score:     Past Medical History:  Diagnosis Date  . Atrial arrhythmia    atrial tachycardia with variable AV conduction versus atypical aflutter 01/10/19, rate control (02/06/19)  . Cataract   . Dyspnea    on exertion  . ESRD (end stage renal disease) ([facility]    Hemo- MWF, Polycystic kidney disease  . Fatigue   . History of kidney stones    removal of stone- cysto  . Hyperlipidemia   . Hyperparathyroidism, secondary renal ([facility]   . Hypertension   . Hypoxemia 12/12/2013  . Nonischemic cardiomyopathy ([facility]    Er 25% 2015, 55 % 2013  . OSA on CPAP    no longer using cpap  . OSA on CPAP 03/24/2014  . Pneumonia    2015ish  . Ventricular tachycardia//Freq PVCs   . Wears glasses     Past Surgical History:  Procedure Laterality Date  . A-FLUTTER ABLATION N/A 04/11/2019   Procedure: A-FLUTTER ABLATION;  Surgeon: TEvans Lance MD;  [facility];  Service: Cardiovascular;  Laterality: N/A;  . A/V FISTULAGRAM Left 04/27/2017   Procedure: A/V  FISTULAGRAM;  Surgeon: CConrad [place] MD;  [facility];  Service: Cardiovascular;  Laterality: Left;  lt arm  . A/V FISTULAGRAM Left 01/10/2019   Procedure: A/V FISTULAGRAM;  Surgeon: CMarty Heck MD;  [facility];  Service: Cardiovascular;  Laterality: Left;  . APPENDECTOMY    . AV FISTULA PLACEMENT  12/05/2011   Procedure: ARTERIOVENOUS (AV) FISTULA CREATION;LLEFT ARM  Surgeon: BConrad [place] MD;  [facility]  Service: Vascular;  Laterality: Left;  RADIO-CEPHALIC  fistula left arm  . AV FISTULA PLACEMENT  01/11/2012   Procedure: ARTERIOVENOUS (AV) FISTULA CREATION;  Surgeon: BConrad [place] MD;  [place]  Service: Vascular;  Laterality: Left;  Creation of left brachial cephalic arteriovenous fistula  . AV FISTULA PLACEMENT Right 03/07/2019   Procedure: ARTERIOVENOUS (AV) FISTULA CREATION  RIGHT ARM;  Surgeon: CMarty Heck MD;  [place] [facility]  Service: Vascular;  Laterality: Right;  . BASCILIC VEIN TRANSPOSITION Left 12/27/2016   Procedure: BASILIC VEIN TRANSPOSITION LEFT UPPER ARM  FIRST STAGE;  Surgeon: Conrad [place], MD;  Location: [place];  Service: Vascular;  Laterality: Left;  . BASCILIC VEIN TRANSPOSITION Left 01/31/2017   Procedure: LEFT ARM BASILIC VEIN TRANSPOSITION, SECOND STAGE;  Surgeon: Conrad [place], MD;  Location: [place];  Service: Vascular;  Laterality: Left;  . CARDIAC CATHETERIZATION  04-05-2010   checking for blockage but none-[facility]  . COLONOSCOPY    . CYSTOSCOPY W/ STONE MANIPULATION     "laser once" (01/22/2013)  . HEMATOMA EVACUATION Left 05/09/2017   Procedure: EVACUATION HEMATOMA LEFT ARM;  Surgeon: Conrad [place], MD;  Location: [place];  Service: Vascular;  Laterality: Left;  . HERNIA REPAIR    . INGUINAL HERNIA REPAIR Right 11/06/2015   Procedure: OPEN HERNIA REPAIR  RIGHT INGUINAL ADULT;  Surgeon: [name], MD;  [facility];  Service: General;  Laterality: Right;  with MESH  . INSERTION OF DIALYSIS CATHETER  Right 10/05/2016   Procedure: INSERTION OF right internal jugular DIALYSIS CATHETER;  Surgeon: [name], MD;  Location: [place];  Service: Vascular;  Laterality: Right;  . INSERTION OF MESH  03/20/2012   Procedure: INSERTION OF MESH;  UMB Surgeon: [name], MD;  Location: [place];  Service: General;  Laterality: N/A;  . INSERTION OF MESH N/A 01/22/2013   Procedure: INSERTION OF MESH;  Surgeon: [name], MD;  Location: [place];  Service: General;  Laterality: N/A;  . LAPAROTOMY  04/02/2012   Procedure: EXPLORATORY LAPAROTOMY;  Surgeon: [name], MD;  Location: [place];  Service: General;  Laterality: N/A;  Exploratory Laparotomy with resection of small intestine  . LEFT HEART CATHETERIZATION WITH CORONARY ANGIOGRAM N/A 05/14/2013   Procedure: LEFT HEART CATHETERIZATION WITH CORONARY ANGIOGRAM;  Surgeon: Sinclair Grooms, MD;  [facility];  Service: Cardiovascular;  Laterality: N/A;  . LIGATION OF ARTERIOVENOUS  FISTULA Left 12/27/2016   Procedure: LIGATION/EXCISION OF LEFT UPPER ARM ARTERIOVENOUS  FISTULA;  EVACUATION OF HEMATOMA;  Surgeon: Conrad [place], MD;  Location: [place];  Service: Vascular;  Laterality: Left;  . LIGATION OF ARTERIOVENOUS  FISTULA Left 03/07/2019   Procedure: LIGATION OF ARTERIOVENOUS FISTULA  LEFT UPPER ARM;  Surgeon: Marty Heck, MD;  Location: [place];  Service: Vascular;  Laterality: Left;  . REVISON OF ARTERIOVENOUS FISTULA Left 10/05/2016   Procedure: REVISON OF left arm ARTERIOVENOUS FISTULA;  Surgeon: [name], MD;  Location: [place];  Service: Vascular;  Laterality: Left;  . TONSILLECTOMY    . UMBILICAL HERNIA REPAIR  03/20/2012   Procedure: HERNIA REPAIR UMBILICAL ADULT;  Surgeon: [name], MD;  Location: [place];  Service: General;  Laterality: N/A;  . UMBILICAL HERNIA REPAIR  01/22/2013   preperitoneal open procedure due to significant adhesions/notes 01/22/2013  . VENTRAL HERNIA REPAIR N/A 01/22/2013   Procedure:  ATTEMPTED LAPAROSCOPIC VENTRAL HERNIA CONVERTED TO OPEN;  Surgeon: [name], MD;  Location: [place];  Service: General;  Laterality: N/A;     Home Medications:  Prior to Admission medications   Medication Sig Start Date End Date Taking? Authorizing Provider  acyclovir (ZOVIRAX) 400 MG tablet Take 400 mg by mouth daily with lunch.  11/16/17  Yes [provider]  aspirin EC 81 MG EC tablet Take 1 tablet (81 mg total) by mouth daily. 05/19/13  Yes Lendon Colonel, NP  atorvastatin (LIPITOR) 40 MG tablet Take 40 mg by mouth at bedtime.    Yes [provider]  dorzolamide-timolol (COSOPT) 22.3-6.8 MG/ML ophthalmic solution  Place 1 drop into both eyes 2 (two) times daily. 03/12/19  Yes [name], MD  nitroGLYCERIN (NITROSTAT) 0.4 MG SL tablet Place 1 tablet (0.4 mg total) under the tongue every 5 (five) minutes x 3 doses as needed for chest pain. 05/19/13  Yes Lendon Colonel, NP  prednisoLONE acetate (PRED FORTE) 1 % ophthalmic suspension Place 1 drop into the right eye daily at 12 noon.    Yes [provider]  sevelamer carbonate (RENVELA) 800 MG tablet Take 1,600 mg by mouth See admin instructions. Take 1600 mg with each meal   Yes [provider]  HYDROcodone-acetaminophen (NORCO) 5-325 MG tablet Take 1 tablet by mouth every 6 (six) hours as needed for moderate pain. Patient not taking: Reported on 05/22/2019 03/07/19   Dagoberto Ligas, PA-C  metoprolol succinate (TOPROL-XL) 25 MG 24 hr tablet Take 0.5 tablets (12.5 mg total) by mouth 2 (two) times daily. 06/04/19 07/04/19  Darliss Cheney, MD    Inpatient Medications: Scheduled Meds: . acyclovir  400 mg Oral Q lunch  . aspirin EC  81 mg Oral Daily  . atorvastatin  40 mg Oral QHS  . Chlorhexidine Gluconate Cloth  6 each Topical Q0600  . dorzolamide-timolol  1 drop Both Eyes BID  . doxercalciferol  2 mcg Intravenous Q M,W,F-HD  . feeding supplement (NEPRO CARB STEADY)  237 mL Oral BID BM  . heparin   5,000 Units Subcutaneous Q8H  . mouth rinse  15 mL Mouth Rinse BID  . metoprolol succinate  12.5 mg Oral BID  . multivitamin  1 tablet Oral QHS  . pantoprazole  40 mg Oral Daily  . prednisoLONE acetate  1 drop Right Eye Q1200  . sevelamer carbonate  1,600 mg Oral TID WC  . sodium chloride flush  10-40 mL Intracatheter Q12H   Continuous Infusions: . sodium chloride     PRN Meds: sodium chloride, acetaminophen, albuterol, LORazepam, pentafluoroprop-tetrafluoroeth, senna, sodium chloride flush  Allergies:   No Known Allergies  Social History:   Social History   Socioeconomic History  . Marital status: Married    Spouse name: [name]  . Number of children: 3  . Years of education: 16  . Highest education level: Not on file  Occupational History    Comment: disabled  Tobacco Use  . Smoking status: Never Smoker  . Smokeless tobacco: Never Used  Substance and Sexual Activity  . Alcohol use: No    Alcohol/week: 0.0 standard drinks  . Drug use: No  . Sexual activity: Yes  Other Topics Concern  . Not on file  Social History Narrative   Patient is married [place]) and lives at home with his wife and children.   Patient has three children.   Patient is in disability.   Patient has a high school education.   Patient is right-handed   Patient drinks very little soda.            Social Determinants of Health   Financial Resource Strain:   . Difficulty of Paying Living Expenses: Not on file  Food Insecurity:   . Worried About Charity fundraiser in the Last Year: Not on file  . Ran Out of Food in the Last Year: Not on file  Transportation Needs:   . Lack of Transportation (Medical): Not on file  . Lack of Transportation (Non-Medical): Not on file  Physical Activity:   . Days of Exercise per Week: Not on file  . Minutes of Exercise per Session: Not  on file  Stress:   . Feeling of Stress : Not on file  Social Connections:   . Frequency of Communication with Friends and  Family: Not on file  . Frequency of Social Gatherings with Friends and Family: Not on file  . Attends Religious Services: Not on file  . Active Member of Clubs or Organizations: Not on file  . Attends Archivist Meetings: Not on file  . Marital Status: Not on file  Intimate Partner Violence:   . Fear of Current or Ex-Partner: Not on file  . Emotionally Abused: Not on file  . Physically Abused: Not on file  . Sexually Abused: Not on file    Family History:   Family History  Problem Relation Age of Onset  . Heart disease Mother   . Hyperlipidemia Mother   . Hypertension Mother   . Kidney disease Father   . Stroke Father   . Kidney disease Brother   . Amblyopia Neg Hx   . Blindness Neg Hx   . Cataracts Neg Hx   . Diabetes Neg Hx   . Glaucoma Neg Hx   . Macular degeneration Neg Hx   . Retinal detachment Neg Hx   . Strabismus Neg Hx   . Retinitis pigmentosa Neg Hx      ROS:  Please see the history of present illness.  All other ROS reviewed and negative.     Physical Exam/Data:   Vitals:   06/03/19 1201 06/03/19 2230 06/04/19 0641 06/04/19 0820  BP: (!) 143/76  139/75 128/76  Pulse: 68  75 83  Resp: 18     Temp: 97.9 F (36.6 C) 97.7 F (36.5 C) 97.9 F (36.6 C)   TempSrc: Oral  Oral   SpO2: 100%  100% 99%  Weight:   80.2 kg   Height:        Intake/Output Summary (Last 24 hours) at 06/04/2019 1313 Last data filed at 06/03/2019 2030 Gross per 24 hour  Intake 360 ml  Output --  Net 360 ml   Last 3 Weights 06/04/2019 06/03/2019 06/03/2019  Weight (lbs) 176 lb 12.9 oz 171 lb 1.2 oz 185 lb 10 oz  Weight (kg) 80.2 kg 77.6 kg 84.2 kg     Body mass index is 28.54 kg/m.  General:  Well nourished, well developed, in no acute distress, looks older then his age, chronically ill appearing HEENT: normal Lymph: no adenopathy Neck: no JVD Endocrine:  No thryomegaly Vascular: No carotid bruits  Cardiac:  RRR; soft SM, no gallops or rubs Lungs:  CTA b/l, no wheezing,  rhonchi or rales  Abd: soft, nontender Ext: no edema Musculoskeletal:  No deformities,advanced atrophy Skin: warm and dry  Neuro:   no focal abnormalities noted Psych:  Normal affect   EKG:  The EKG was personally reviewed and demonstrates:    04/27/20 SR, RBBB,  LAD, PVCs, no clear ischemic looking changes, not markedly different from prior  Today is SR, 69, 1st degree AVblock, PR 217m, IVCD, LAD, QS II, III, aVf, poor r progression  Telemetry:  Telemetry was personally reviewed and demonstrates:    Reviewed with Dr. ARayann Heman SR, 60's-70's, intermittent, at times frequent episodes of tachycardia.  These are brief in duration and do not change morphology and are supraventricular, likely an Atach   Relevant CV Studies:  04/11/2019: EPS/ablation CONCLUSIONS:  1. Isthmus-dependent right atrial flutter upon presentation.  2. Successful radiofrequency ablation of atrial flutter along the cavotricuspid isthmus with complete bidirectional isthmus  block achieved.  3. No inducible arrhythmias following ablation.  4. No early apparent complications.   07/23/2013: TTE Study Conclusions  - Left ventricle: The cavity size was mildly dilated. Wall  thickness was normal. Systolic function was severely  reduced. The estimated ejection fraction was in the range  of 25% to 30%. There is akinesis of the posterior  myocardium. There is severe hypokinesis of the lateral  myocardium. There is hypokinesis of the anterior  myocardium. Doppler parameters are consistent with  abnormal left ventricular relaxation (grade 1 diastolic  dysfunction).  - Aortic valve: Mild regurgitation.  - Aortic root: The aortic root was mildly dilated.  - Left atrium: The atrium was mildly dilated.   05/13/2013: TTE Study Conclusions  - Left ventricle: The cavity size was moderately dilated.  Wall thickness was normal. Systolic function was severely  reduced. The estimated ejection fraction was in  the range  of 25% to 30%. There is severe hypokinesis of the  entireinferior myocardium. Left ventricular diastolic  function parameters were normal.  - Aortic valve: Mild regurgitation.  - Mitral valve: Mild regurgitation.  - Left atrium: The atrium was mildly dilated.  - Right atrium: The atrium was mildly dilated.   Notes report: he had a stress echo which was abnormal in 2011; followed by cardiac catheterization.  Report viewed in [place] - normal coronaries, EF 50%.   08/15/2008: LVEF 55-60%, Probable akinesis of the basal   inferoseptal myocardium   Laboratory Data:  High Sensitivity Troponin:   Recent Labs  Lab 05/29/19 1146 05/29/19 1628 05/29/19 1826 05/29/19 2151 05/30/19 0001  TROPONINIHS 85* 658* 977* 1,392* 1,582*     Chemistry Recent Labs  Lab 06/02/19 2006 06/03/19 0552 06/04/19 0449  NA 137 139 137  K 4.9 5.0 4.2  CL 94* 94* 98  CO2 _0 GLUCOSE 90 70 91  BUN 47* 51* 35*  CREATININE 10.59* 11.51* 9.67*  CALCIUM 9.1 9.4 8.8*  GFRNONAA 5* 4* 5*  GFRAA 5* 5* 6*  ANIONGAP 18* 19* 13    Recent Labs  Lab 05/29/19 1628 06/01/19 0418 06/02/19 0450  PROT 5.8* 6.0*  --   ALBUMIN 3.1* 2.8* 2.6*  AST 22 38  --   ALT 11 6  --   ALKPHOS 34* 50  --   BILITOT 0.9 1.5*  --    Hematology Recent Labs  Lab 06/01/19 0500 06/03/19 0552 06/04/19 0449  WBC 6.1 4.1 3.9*  RBC 5.74 4.80 5.06  HGB 14.5 12.2* 12.9*  HCT 47.0 41.2 43.2  MCV 81.9 85.8 85.4  MCH 25.3* 25.4* 25.5*  MCHC 30.9 29.6* 29.9*  RDW 20.2* 18.8* 18.8*  PLT 170 135* 128*   BNPNo results for input(s): BNP, PROBNP in the last 168 hours.  DDimer No results for input(s): DDIMER in the last 168 hours.   Radiology/Studies:   MR BRAIN WO CONTRAST Result Date: 06/02/2019 CLINICAL DATA:  Cardiac arrest with abnormal neurologic status subsequent. EXAM: MRI HEAD WITHOUT CONTRAST TECHNIQUE: Multiplanar, multiecho pulse sequences of the brain and surrounding structures  were obtained without intravenous contrast. COMPARISON:  CT 05/29/2019 FINDINGS: Brain: Diffusion imaging does not show any acute or subacute infarction. No sign of anoxic brain injury. Brainstem and cerebellum are normal. Cerebral hemispheres show mild age related atrophy without small or large vessel infarction. No mass lesion, hemorrhage, hydrocephalus or extra-axial collection. Vascular: Major vessels at the base of the brain show flow. Skull and upper cervical spine: Negative Sinuses/Orbits: Clear/normal Other: None  IMPRESSION: Mild cerebral hemispheric brain atrophy for age. Otherwise normal examination. No old or acute small or large vessel infarction. No evidence of diffuse hypoxic ischemic injury. Electronically Signed   By: Nelson Chimes M.D.   On: 06/02/2019 12:54   {   Assessment and Plan:   1. Inter-op arrest     anesthesiology makes note that the patient had a rhythm c/w baseline and initial issues were with oxygenation, there was a monitor failure (for approx 1 minute) during which loss of pulse was noted, had CPR, epi,  LMA removed and intubated with ROSC     This was suspected to be a primary respiratory event/PEA  Follow up echo is pending Dr. Rayann Heman has seen  Tachycardias on telemetry are supraventricular, he has a known Atach He does have some sinus node dysfunction (noted post ablation) and agree with Toprol, though will reduce to 12.15m daily.  He will need general cardiology follow up (arranged), GDMT to his HR and BP tolerance with re-evaluation of his LVEF again in the future. EP follow up is arranged as well.  EP service will sign off, though remain available.    For questions or updates, please contact CHumblePlease consult [email or web address] for contact info under     Signed, RBaldwin Jamaica PA-C  06/04/2019 1:13 PM   I have seen, examined the patient, and reviewed the above assessment and plan.  Changes to above are made where necessary.  On exam, RRR.   The patient is chronically ill with multiple chronic comoribidities.  He has a h/o severe left ventricular disfunction and is not on medicine, nor has cardiology follow-up.  He has chronically been found to have nonsustained atrial tachycardia for which he is asymptomatic. Now with respiratory arrest of unclear etiology in the setting of anesthesia.  No acute indication for ICD implantation as he has not had a ventricular arrhythmia.  I would advise that he recover from his recent surgery and arrest.  Ideally, he would be initiated on medical therapy for his nonischemic CM, though medical therapy is limited by bradycardia, renal failure, and hypotension.  He will need close general cardiology care post discharge.  Follow-up with Dr TLovena Lefor consideration of an ICD electively for primary prevention if he can demonstrate compliance with follow-up and therapy.  Very complicated patient.  He is at risk for further decompensation.  I have discussed at length with Dr HEllyn Hack  A high level of decision making was required for this encounter.  Electrophysiology team to see as needed while here. Please call with questions.   Co Sign: JThompson Grayer MD 06/04/2019 9:46 PM

## 2019-06-04 NOTE — Progress Notes (Signed)
Patient admitted to ip rehab during shift. Patient informed on safety plan and visitor policy and understood. Skin assesment preformed and skin tear noted to medical upper back, foam in place. Adria Devon, LPN

## 2019-06-04 NOTE — Discharge Instructions (Signed)
Cardiomyopathy, Adult  Cardiomyopathy is a long-term (chronic) disease of the heart muscle. The disease makes the heart muscle thick, weak, or stiff. As a result, the heart works harder to pump blood. Over time, cardiomyopathy can lead to an irregular heartbeat (arrhythmia) and heart failure. There are several types of cardiomyopathy. The kind of cardiomyopathy that you have depends on what part of the heart is affected and how it is affected. What are the causes? This condition may be caused by:  A medical condition that damages the heart, such as diabetes, high blood pressure, or heart attack.  Diseases of the immune system, connective tissues, or endocrine system.  Alcohol abuse.  Using illegal drugs.  Taking certain medicines.  Pregnancy.  Too much iron in the body (hemochromatosis).  Cancer treatments.  Protein buildup in the organs (amyloidosis), or inflammation in the organs (sarcoidosis). In some cases, the cause is not known. What increases the risk? You are more likely to develop this condition if:  You have a gene that is passed down (inherited) from a family member.  You are overweight or obese. What are the signs or symptoms? Often, people with this condition have no symptoms. If you do have symptoms, this may include:  Shortness of breath, especially during activity.  Fatigue.  An irregular heartbeat.  Feeling dizzy or light-headed.  Fainting.  Chest pain.  Coughing.  Swelling of the lower legs, feet, abdomen, or neck veins. How is this diagnosed? This condition is diagnosed based on:  Your symptoms and medical history.  A physical exam.  Blood tests and imaging studies, such as X-ray, echocardiogram, or MRI.  Other tests, such as: ? An electrocardiogram (ECG). ? A portable heart monitor that records your heart's electrical activity (event monitor). ? A stress test. ? Cardiac catheterization and coronary angiogram. ? Heart tissue  biopsy. How is this treated? Your treatment depends on the type and severity of your symptoms. If you do not have symptoms, you may not need treatment. Treatment may include:  General healthy lifestyle changes.  Medicines to: ? Treat high blood pressure, abnormal heart rate, or inflammation. ? Clear excess fluids from your body. ? Prevent blood clots. ? Balance minerals (electrolytes) in your body and get rid of extra sodium in your body. ? Strengthen your heartbeat.  Surgery to: ? Repair a heart defect, remove heart tissue, or destroy tissues in the area of abnormal electrical activity (ablation). ? Implant a device to treat serious heart rhythm problems or to restore the heart's ability to pump blood. ? Replace your heart with a healthy heart. This is done if all other treatments have failed. Other treatments may include:  Cardiac resynchronization therapy (CRT). This restores the heart's normal beating. Follow these instructions at home: Lifestyle   Eat a heart-healthy diet that includes plenty of fruits, vegetables, whole grains, and foods that are low in salt (sodium).  Maintain a healthy weight.  Stay physically active. Ask your health care provider what activities are safe for you.  Do not use any products that contain nicotine or tobacco, such as cigarettes, e-cigarettes, and chewing tobacco. If you need help quitting, ask your health care provider.  Try to get at least 7 hours of sleep each night.  Find healthy ways to manage stress. Alcohol use  Do not drink alcohol if: ? Your health care provider tells you not to drink. ? You are pregnant, may be pregnant, or are planning to become pregnant. If you drink alcohol:  Limit how   much you use to: ? 0-1 drink a day for women. ? 0-2 drinks a day for men.  Be aware of how much alcohol is in your drink. In the [place] one drink equals one 12 oz bottle of beer (355 mL), one 5 oz glass of wine (148 mL), or one 1 oz glass  of hard liquor (44 mL). General instructions  Take over-the-counter and prescription medicines only as told by your health care provider. Some medicines can be dangerous for your heart.  If you are prescribed a blood thinner, make sure you understand what to do in a bleeding situation.  Tell all health care providers, including your dentist, that you have cardiomyopathy. Ask your health care provider if you need antibiotics before having dental care or before surgery.  Ask your health care provider if you should wear a medical identification bracelet. This may be important if you have a pacemaker or a defibrillator.  Get all needed vaccines. Get a flu shot every year.  Work closely with your health care provider to manage any chronic conditions.  If you plan to start a family, talk to a genetic counselor to discuss the risk of having a child with cardiomyopathy.  Keep all follow-up visits as told by your health care provider. This is important, even if you do not have any symptoms. Your health care provider may need to make sure your condition is not getting worse. Contact a health care provider if:  Your symptoms get worse.  You have new symptoms. Get help right away if you:  Have severe chest pain.  Have shortness of breath.  Cough up a pink, bubbly substance.  Feel nauseous and you vomit.  Suddenly become light-headed or dizzy.  Feel your heart beating very quickly.  Feel like your heart is skipping beats. These symptoms may represent a serious problem that is an emergency. Do not wait to see if the symptoms will go away. Get medical help right away. Call your local emergency services (911 in the [place] Do not drive yourself to the hospital. Summary  Cardiomyopathy is a long-term (chronic) disease of the heart muscle.  Over time, cardiomyopathy can lead to an irregular heartbeat (arrhythmia) and heart failure.  A number of treatments are available for this condition.  Your treatment will depend on the type and severity of your symptoms.  Follow your health care provider's instructions on diet, medicines, physical activity, and when to seek help. This information is not intended to replace advice given to you by your health care provider. Make sure you discuss any questions you have with your health care provider. Document Revised: 06/21/2018 Document Reviewed: 06/21/2018 [employer] Patient Education  2020 [employer]  

## 2019-06-04 NOTE — Progress Notes (Signed)
Izora Ribas, MD  Physician  Physical Medicine and Rehabilitation  PMR Pre-admission  Signed  Date of Service:  06/04/2019 10:13 AM      Related encounter: Admission (Current) from 05/29/2019 in [facility]      Signed        PMR Admission Coordinator Pre-Admission Assessment   Patient: Michael Kruer. is an 64 y.o., male [name]: [id number] DOB: 12-Jul-1955 Height: 5' 6"  (167.6 cm) Weight: 80.2 kg   Insurance Information HMO:     PPO:      PCP:      IPA:      80/20: yes     OTHER:  PRIMARY: Medicare part A and B      Policy#: [id number]      Subscriber: Patient CM Name:       Phone#:      Fax#:  Pre-Cert#:       Employer:  Benefits:  Phone #: online     Name: verified online via [employer] on 06/04/19 Eff. Date: Part A and B effective 06/02/2014     Deduct: $1,484      Out of Pocket Max: NA      Life Max: NA CIR: Covered per Medicare guidelines      SNF: days 1-20, 100%, days 21-100, 80% Outpatient: 80%     Co-Pay: 20% Home Health: 100%      Co-Pay:  DME: 80%     Co-Pay: 20% Providers: Pt's choice SECONDARY: BCBS      Policy#: [id number]      Subscriber: Patient CM Name:       Phone#:      Fax#:  Pre-Cert#:       Employer:  Benefits:  Phone #: [phone]    Name:  Eff. Date:      Deduct:       Out of Pocket Max:       Life Max: CIR:       SNF:  Outpatient:      Co-Pay:  Home Health:       Co-Pay:  DME:      Co-Pay:  Tertiary: [place] #: [id number] Benefits #: [phone]   Medicaid Application Date:       Case Manager:  Disability Application Date:       Case Worker:    The "Data Collection Information Summary" for patients in Inpatient Rehabilitation Facilities with attached "Privacy Act [place] Records" was provided and verbally reviewed with: Patient and Family   Emergency Contact Information         Contact Information     Name Relation Home Work [place] Spouse [phone]   [phone]           Current Medical History  Patient Admitting Diagnosis: Debility    History of Present Illness: Pt is a 64 yo Male with history of atrial arrhythmia, ESRD on HD MWF, hyperlipidemia, hyperparathyroidism, and HTN. Pt presented for surgery for fistula vs AV graft on 05/29/19 with diagnosis of ESRD. During preop for the procedure, the patient became unresponsive and arrested. He received 4 mins of CPR, Epi x1, with ROSC. The patient was intubated, procedure cancelled and he was taken to critical care. The arrest was most likely secondary to respiratory failure due to sedation. Pt was extubated on 06/01/19 and transferred to the medical floor on 06/02/19. Pt appeared to have some slurred speech, right  facial droop and left arm weakness concerning for stroke. MRI of the head was unremarkable. Cardiology was consulted due to observed bradycardia and tachycardia intermittently with Toprol-XL recommended. Echo to be done in CIR. Pt has been evaluated by therapies with recommendation for CIR. Pt is to admit to CIR on 06/04/19.    Patient's medical record from [facility] has been reviewed by the rehabilitation admission coordinator and physician.   Past Medical History      Past Medical History:  Diagnosis Date  . Atrial arrhythmia      atrial tachycardia with variable AV conduction versus atypical aflutter 01/10/19, rate control (02/06/19)  . Cataract    . Dyspnea      on exertion  . ESRD (end stage renal disease) ([place])      Hemo- MWF, Polycystic kidney disease  . Fatigue    . History of kidney stones      removal of stone- cysto  . Hyperlipidemia    . Hyperparathyroidism, secondary renal ([place])    . Hypertension    . Hypoxemia 12/12/2013  . Nonischemic cardiomyopathy ([place])      Er 25% 2015, 55 % 2013  . OSA on CPAP      no longer using cpap  . OSA on CPAP 03/24/2014  . Pneumonia      2015ish  . Ventricular tachycardia//Freq PVCs    . Wears glasses        Family History    family history includes Heart disease in his mother; Hyperlipidemia in his mother; Hypertension in his mother; Kidney disease in his brother and father; Stroke in his father.   Prior Rehab/Hospitalizations Has the patient had prior rehab or hospitalizations prior to admission? Yes   Has the patient had major surgery during 100 days prior to admission? No               Current Medications   Current Facility-Administered Medications:  .  0.9 %  sodium chloride infusion, 100 mL, Intravenous, PRN, Byrum, Rose Fillers, MD .  acetaminophen (TYLENOL) tablet 650 mg, 650 mg, Oral, Q6H PRN, Collene Gobble, MD .  acyclovir (ZOVIRAX) tablet 400 mg, 400 mg, Oral, Q lunch, Byrum, Rose Fillers, MD, 400 mg at 06/04/19 1232 .  albuterol (PROVENTIL) (2.5 MG/3ML) 0.083% nebulizer solution 2.5 mg, 2.5 mg, Nebulization, Q2H PRN, Collene Gobble, MD, 2.5 mg at 05/29/19 1840 .  aspirin EC tablet 81 mg, 81 mg, Oral, Daily, Pahwani, Ravi, MD, 81 mg at 06/04/19 1011 .  atorvastatin (LIPITOR) tablet 40 mg, 40 mg, Oral, QHS, Collene Gobble, MD, 40 mg at 06/03/19 2117 .  Chlorhexidine Gluconate Cloth 2 % PADS 6 each, 6 each, Topical, Q0600, Alric Seton, PA-C, 6 each at 06/04/19 [phone] .  dorzolamide-timolol (COSOPT) 22.3-6.8 MG/ML ophthalmic solution 1 drop, 1 drop, Both Eyes, BID, Byrum, Rose Fillers, MD, 1 drop at 06/04/19 1012 .  doxercalciferol (HECTOROL) injection 2 mcg, 2 mcg, Intravenous, Q M,W,F-HD, Collene Gobble, MD, 2 mcg at 06/03/19 [phone] .  feeding supplement (NEPRO CARB STEADY) liquid 237 mL, 237 mL, Oral, BID BM, Alric Seton, PA-C, 237 mL at 06/04/19 1012 .  heparin injection 5,000 Units, 5,000 Units, Subcutaneous, Q8H, Karren Cobble, RPH, 5,000 Units at 06/03/19 2117 .  LORazepam (ATIVAN) injection 1 mg, 1 mg, Intravenous, Q4H PRN, Elsie Lincoln, MD, 1 mg at 06/02/19 1215 .  MEDLINE mouth rinse, 15 mL, Mouth Rinse, BID, Byrum, Rose Fillers, MD, 15 mL at 06/04/19 1236 .  metoprolol succinate (TOPROL-XL) 24  hr tablet 12.5 mg, 12.5 mg, Oral, BID, Furth, Cadence H, PA-C .  multivitamin (RENA-VIT) tablet 1 tablet, 1 tablet, Oral, QHS, Alric Seton, PA-C, 1 tablet at 06/03/19 2117 .  pantoprazole (PROTONIX) EC tablet 40 mg, 40 mg, Oral, Daily, Collene Gobble, MD, 40 mg at 06/04/19 1011 .  pentafluoroprop-tetrafluoroeth (GEBAUERS) aerosol 1 application, 1 application, Topical, PRN, Byrum, Rose Fillers, MD .  prednisoLONE acetate (PRED FORTE) 1 % ophthalmic suspension 1 drop, 1 drop, Right Eye, Q1200, Collene Gobble, MD, 1 drop at 06/04/19 1011 .  senna (SENOKOT) tablet 8.6 mg, 1 tablet, Oral, BID PRN, Collene Gobble, MD .  sevelamer carbonate (RENVELA) tablet 1,600 mg, 1,600 mg, Oral, TID WC, Collene Gobble, MD, 1,600 mg at 06/04/19 1232 .  sodium chloride flush (NS) 0.9 % injection 10-40 mL, 10-40 mL, Intracatheter, Q12H, Byrum, Rose Fillers, MD, 10 mL at 06/03/19 2200 .  sodium chloride flush (NS) 0.9 % injection 10-40 mL, 10-40 mL, Intracatheter, PRN, Byrum, Rose Fillers, MD   Patients Current Diet:     Diet Order                      DIET SOFT Room service appropriate? Yes; Fluid consistency: Thin; Fluid restriction: 1500 mL Fluid  Diet effective now                   Precautions / Restrictions Precautions Precautions: Fall Restrictions Weight Bearing Restrictions: No    Has the patient had 2 or more falls or a fall with injury in the past year? No   Prior Activity Level Community (5-7x/wk): very active PTA. no AD use PTA. Pt is retired but still drives and is active and Independent   Prior Functional Level Self Care: Did the patient need help bathing, dressing, using the toilet or eating? Independent   Indoor Mobility: Did the patient need assistance with walking from room to room (with or without device)? Independent   Stairs: Did the patient need assistance with internal or external stairs (with or without device)? Independent   Functional Cognition: Did the patient need help  planning regular tasks such as shopping or remembering to take medications? Independent   Home Assistive Devices / Equipment Home Assistive Devices/Equipment: Eyeglasses, Blood pressure cuff, Grab bars in shower, Hand-held shower hose, Scales Home Equipment: Cane - single point   Prior Device Use: Indicate devices/aids used by the patient prior to current illness, exacerbation or injury? None of the above   Current Functional Level Cognition   Overall Cognitive Status: Impaired/Different from baseline Orientation Level: Oriented to person, Oriented to place, Oriented to situation Following Commands: Follows one step commands with increased time, Follows one step commands inconsistently Safety/Judgement: Decreased awareness of safety, Decreased awareness of deficits General Comments: patient more alert this session however still demonstrating cognitive deficits requiring multimodal cues    Extremity Assessment (includes Sensation/Coordination)   Upper Extremity Assessment: Generalized weakness  Lower Extremity Assessment: Generalized weakness     ADLs   Overall ADL's : Needs assistance/impaired Eating/Feeding: Maximal assistance, Bed level Grooming: Maximal assistance, Bed level Upper Body Bathing: Maximal assistance, Sitting, Bed level Lower Body Bathing: Maximal assistance, +2 for physical assistance, Sitting/lateral leans, Sit to/from stand Upper Body Dressing : Maximal assistance, Sitting, Bed level Lower Body Dressing: Maximal assistance, +2 for physical assistance, Sitting/lateral leans, Sit to/from stand Toilet Transfer: Moderate assistance, Regular Toilet, RW, Ambulation, Grab bars, Cueing for safety, Cueing for sequencing  Toilet Transfer Details (indicate cue type and reason): decreased carry over of hand placement not to pull on walker when standing, use grab bar. decreased eccentric control with sitting Toileting- Clothing Manipulation and Hygiene: Moderate  assistance Toileting - Clothing Manipulation Details (indicate cue type and reason): managing gown when sitting Functional mobility during ADLs: Minimal assistance, Moderate assistance, Rolling walker, Cueing for safety, Cueing for sequencing General ADL Comments: patient demonstrates increased arousal with improved functional mobility however still high fall risk due to lateral and posterior loss of balance, especially when turning the walker     Mobility   Overal bed mobility: Needs Assistance Bed Mobility: Supine to Sit, Sit to Supine Rolling: Mod assist Sidelying to sit: Mod assist Supine to sit: Min guard, HOB elevated Sit to supine: Min guard General bed mobility comments: min cues to initiate bed mobility     Transfers   Overall transfer level: Needs assistance Equipment used: Rolling walker (2 wheeled) Transfers: Sit to/from Stand Sit to Stand: Mod assist, Min assist General transfer comment: min A from edge of bed with cues for hand placement, mod A from toilet due to low surface     Ambulation / Gait / Stairs / Wheelchair Mobility   Ambulation/Gait Ambulation/Gait assistance: Mod assist, +2 safety/equipment(chair follow/line management) Gait Distance (Feet): 60 Feet(x1, 100'x1) Assistive device: Rolling walker (2 wheeled) Gait Pattern/deviations: Step-to pattern, Drifts right/left, Trunk flexed General Gait Details: pt with increased trunk flexion, can achieve full upright posture with verbal cues but has difficulty maintaining it, pt with vearing to the R especially with onset of fatigue, pt with desire to keep ambulating despite tripping over feet and running into obstacles and requiring more assist Gait velocity: dec Gait velocity interpretation: <1.8 ft/sec, indicate of risk for recurrent falls     Posture / Balance Dynamic Sitting Balance Sitting balance - Comments: assist for sitting upright and cues to hold self up Balance Overall balance assessment: Needs  assistance Sitting-balance support: No upper extremity supported, Feet supported Sitting balance-Leahy Scale: Good Sitting balance - Comments: assist for sitting upright and cues to hold self up Standing balance support: Bilateral upper extremity supported, During functional activity Standing balance-Leahy Scale: Poor Standing balance comment: reliant on external support, min A with initial standing to regain balance     Special needs/care consideration BiPAP/CPAP: no CPM : no Continuous Drip IV : no Dialysis: yes       Days : MWF Life Vest : no Oxygen : not during day, uses O2 during HD treatment per report, and wife states he may need it during night Special Bed : no Trach Size : no Wound Vac (area) : no      Location : no Skin: ecchymosis to left mid, skin tear to mid medial back                         Bowel mgmt: last BM 05/29/19 Bladder mgmt: incontinent Diabetic mgmt: no Behavioral consideration : some confusion during hospital stay. Chemo/radiation : no    Previous Home Environment (from acute therapy documentation) Living Arrangements: Spouse/significant other, Children Available Help at Discharge: Family, Available 24 hours/day Type of Home: House Home Layout: One level Home Access: Stairs to enter Entrance Stairs-Rails: Right, Left Entrance Stairs-Number of Steps: 4 Bathroom Shower/Tub: Chiropodist: Handicapped height Home Care Services: No Additional Comments: per family/chart as pt unable to answer due to lethargy   Discharge Living Setting Plans for Discharge Living Setting:  Patient's home, Lives with (comment)(wife and 79 yo son ) Type of Home at Discharge: House Discharge Home Layout: One level Discharge Home Access: Stairs to enter Entrance Stairs-Rails: Can reach both Entrance Stairs-Number of Steps: 3 Discharge Bathroom Shower/Tub: Other (comment)(pt sponge bathes due to port) Discharge Bathroom Toilet: Handicapped height Discharge  Bathroom Accessibility: Yes How Accessible: Accessible via walker Does the patient have any problems obtaining your medications?: No   Social/Family/Support Systems Patient Roles: Spouse, Parent(parent to 56 yo son with Autism) Contact Information: wife: [name] [phone]) Anticipated Caregiver: wife plans to take FMLA.  Anticipated Caregiver's Contact Information: see above Ability/Limitations of Caregiver: Supervision  Caregiver Availability: 24/7 Discharge Plan Discussed with Primary Caregiver: Yes(pt and his wife) Is Caregiver In Agreement with Plan?: Yes Does Caregiver/Family have Issues with Lodging/Transportation while Pt is in Rehab?: No   Goals/Additional Needs Patient/Family Goal for Rehab: PT/OT/SLP: Supervision Expected length of stay: 7-10 days Cultural Considerations: NA Dietary Needs: soft diet, thin liquids; fluid restriction 1500 mL fluid Equipment Needs: TBD Special Service Needs: HD MWF Pt/Family Agrees to Admission and willing to participate: Yes Program Orientation Provided & Reviewed with Pt/Caregiver Including Roles  & Responsibilities: Yes(pt and his wife)  Barriers to Discharge: Home environment access/layout, Hemodialysis  Barriers to Discharge Comments: steps to enter. on HD MWF, has been clipped for years.    Decrease burden of Care through IP rehab admission: NA   Possible need for SNF placement upon discharge: Not anticipated. Pt has good support from family at DC. With clearing mentation, pt has good prognosis for further progress through CIR. Anticipate pt can reach supervision goals in a short period of time.    Patient Condition: I have reviewed medical records from [facility], spoken with MD, RN, and patient and spouse. I met with patient at the bedside for inpatient rehabilitation assessment.  Patient will benefit from ongoing PT, OT and SLP, can actively participate in 3 hours of therapy a day 5 days of the week, and can make  measurable gains during the admission.  Patient will also benefit from the coordinated team approach during an Inpatient Acute Rehabilitation admission.  The patient will receive intensive therapy as well as Rehabilitation physician, nursing, social worker, and care management interventions.  Due to safety, skin/wound care, disease management, medication administration, pain management and patient education the patient requires 24 hour a day rehabilitation nursing.  The patient is currently Min/Mod A for transfers and Mod A +2 60 feet with RW for mobility and Min/Mod A for basic ADLs.  Discharge setting and therapy post discharge at home with home health is anticipated.  Patient has agreed to participate in the Acute Inpatient Rehabilitation Program and will admit 06/04/19.   Preadmission Screen Completed By:  Raechel Ache, 06/04/2019 12:44 PM ______________________________________________________________________   Discussed status with Dr. Ranell Patrick on 06/04/19 at 12:43PM and received approval for admission today.   Admission Coordinator:  Raechel Ache, OT, time 12:43PM/Date 06/04/19.    Assessment/Plan: Diagnosis: Cardiac debility 1. Does the need for close, 24 hr/day Medical supervision in concert with the patient's rehab needs make it unreasonable for this patient to be served in a less intensive setting? Yes 2. Co-Morbidities requiring supervision/potential complications: hypotension, hypoxia, s/p cardiac arrest, ESRD, acute hypoxic respiratory failure s/p vent, nonischecmic cardiomyopathy, HTN, anemia 3. Due to safety, skin/wound care, disease management, medication administration, pain management and patient education, does the patient require 24 hr/day rehab nursing? Yes and No 4. Does the patient require coordinated care  of a physician, rehab nurse, PT, OT, and SLP to address physical and functional deficits in the context of the above medical diagnosis(es)? Yes Addressing deficits in the following  areas: balance, endurance, locomotion, strength, transferring, bathing, dressing, feeding, grooming, toileting and psychosocial support 5. Can the patient actively participate in an intensive therapy program of at least 3 hrs of therapy 5 days a week? Yes 6. The potential for patient to make measurable gains while on inpatient rehab is excellent 7. Anticipated functional outcomes upon discharge from inpatient rehab: modified independent PT, modified independent OT, independent SLP 8. Estimated rehab length of stay to reach the above functional goals is: 12-16 days 9. Anticipated discharge destination: Home 10. Overall Rehab/Functional Prognosis: excellent     MD Signature: Leeroy Cha, MD        Revision History Date/Time User Provider Type Action  06/04/2019 12:52 PM Raulkar, Clide Deutscher, MD Physician Sign  06/04/2019 12:44 PM Raechel Ache, [facility] Admission Coordinator Share   View Details Report

## 2019-06-04 NOTE — Progress Notes (Signed)
Occupational Therapy Treatment Patient Details Name: Michael [name]. [name]: [id number] DOB: June 16, 1955 Today's Date: 06/04/2019    History of present illness 64 y/o male with a PMH of ESRD, HLD, HTN, OSA, V. Tach, and Hyperparathyroidism, presents s/p pre operative (R AV fistula) cardiac arrest.   OT comments  Patient more alert this session, very motivated to get out of bed and participate in therapy. Patient demonstrates cognitive impairments of memory, safety/judgement, problem solving requiring multiple cues to watch for obstacles in his environment when ambulating with walker, to take his time when turning as patient has lateral and posterior loss of balance requiring up to mod A for safety with balance. Patient require mod A for toilet transfer with cues for hand placement during sit <> stand. Patient continuing to progress with acute OT goals.    Follow Up Recommendations  CIR;Supervision/Assistance - 24 hour    Equipment Recommendations  Other (comment)(defer to next venue)       Precautions / Restrictions Precautions Precautions: Fall Restrictions Weight Bearing Restrictions: No       Mobility Bed Mobility Overal bed mobility: Needs Assistance Bed Mobility: Supine to Sit;Sit to Supine     Supine to sit: Min guard;HOB elevated Sit to supine: Min guard   General bed mobility comments: min cues to initiate bed mobility  Transfers Overall transfer level: Needs assistance Equipment used: Rolling walker (2 wheeled) Transfers: Sit to/from Stand Sit to Stand: Mod assist;Min assist         General transfer comment: min A from edge of bed with cues for hand placement, mod A from toilet due to low surface    Balance Overall balance assessment: Needs assistance Sitting-balance support: No upper extremity supported;Feet supported Sitting balance-Leahy Scale: Good     Standing balance support: Bilateral upper extremity supported;During functional activity Standing  balance-Leahy Scale: Poor Standing balance comment: reliant on external support, min A with initial standing to regain balance                           ADL either performed or assessed with clinical judgement   ADL Overall ADL's : Needs assistance/impaired                         Toilet Transfer: Moderate assistance;Regular Toilet;RW;Ambulation;Grab bars;Cueing for safety;Cueing for sequencing Toilet Transfer Details (indicate cue type and reason): decreased carry over of hand placement not to pull on walker when standing, use grab bar. decreased eccentric control with sitting Toileting- Clothing Manipulation and Hygiene: Moderate assistance Toileting - Clothing Manipulation Details (indicate cue type and reason): managing gown when sitting     Functional mobility during ADLs: Minimal assistance;Moderate assistance;Rolling walker;Cueing for safety;Cueing for sequencing General ADL Comments: patient demonstrates increased arousal with improved functional mobility however still high fall risk due to lateral and posterior loss of balance, especially when turning the walker               Cognition Arousal/Alertness: Awake/alert Behavior During Therapy: Impulsive Overall Cognitive Status: Impaired/Different from baseline Area of Impairment: Orientation;Memory;Safety/judgement;Problem solving                 Orientation Level: Time;Situation   Memory: Decreased short-term memory   Safety/Judgement: Decreased awareness of safety;Decreased awareness of deficits   Problem Solving: Slow processing;Difficulty sequencing;Requires verbal cues;Requires tactile cues General Comments: patient more alert this session however still demonstrating cognitive deficits requiring multimodal cues  General Comments VSS    Pertinent Vitals/ Pain       Pain Assessment: No/denies pain         Frequency  Min 2X/week        Progress Toward  Goals  OT Goals(current goals can now be found in the care plan section)  Progress towards OT goals: Progressing toward goals  Acute Rehab OT Goals Patient Stated Goal: To return to independent mobility OT Goal Formulation: With patient/family Time For Goal Achievement: 06/16/19 Potential to Achieve Goals: Good ADL Goals Pt Will Perform Grooming: with min guard assist;standing Pt Will Perform Upper Body Dressing: with set-up;sitting Pt Will Perform Lower Body Dressing: with min guard assist;sit to/from stand Pt Will Transfer to Toilet: with min guard assist;ambulating;bedside commode Pt Will Perform Toileting - Clothing Manipulation and hygiene: with min assist;sitting/lateral leans;sit to/from stand Pt/caregiver will Perform Home Exercise Program: Increased strength;Right Upper extremity;Left upper extremity;With written HEP provided  Plan Discharge plan remains appropriate       AM-PAC OT "6 Clicks" Daily Activity     Outcome Measure   Help from another person eating meals?: A Little Help from another person taking care of personal grooming?: A Little Help from another person toileting, which includes using toliet, bedpan, or urinal?: A Lot Help from another person bathing (including washing, rinsing, drying)?: A Lot Help from another person to put on and taking off regular upper body clothing?: A Little Help from another person to put on and taking off regular lower body clothing?: A Lot 6 Click Score: 15    End of Session Equipment Utilized During Treatment: Gait belt;Rolling walker  OT Visit Diagnosis: Unsteadiness on feet (R26.81);Muscle weakness (generalized) (M62.81);Other symptoms and signs involving cognitive function   Activity Tolerance Patient tolerated treatment well   Patient Left in bed;with call bell/phone within reach;with bed alarm set   Nurse Communication Mobility status        Time: 3557-3220 OT Time Calculation (min): 26 min  Charges: OT General  Charges $OT Visit: 1 Visit OT Treatments $Self Care/Home Management : 23-37 mins  Shon Millet OT OT office: [place] 06/04/2019, 10:17 AM

## 2019-06-04 NOTE — IPOC Note (Signed)
Individualized overall Plan of Care (IPOC) Patient Details Name: Michael [name]. [name]: [id number] DOB: [birth year]  Admitting Diagnosis: Anoxic brain injury [facility])  Hospital Problems: Principal Problem:   Anoxic brain injury (Michael [name]) Active Problems:   Cardiac arrest (Michael [name])   Leukopenia   Thrombocytopenia (Michael [name])   Anemia of chronic disease   History of ventricular tachycardia     Functional Problem List: Nursing Bowel, Endurance, Medication Management  PT Balance, Safety  OT Balance, Cognition, Endurance, Pain, Safety  SLP Cognition, Perception, Safety  TR         Basic ADL's: OT Grooming, Bathing, Dressing, Toileting     Advanced  ADL's: OT       Transfers: PT Bed to Chair, Car, Furniture, Floor  OT Toilet, Tub/Shower     Locomotion: PT Stairs     Additional Impairments: OT None  SLP Communication, Social Cognition expression Problem Solving, Memory, Awareness, Attention  TR      Anticipated Outcomes Item Anticipated Outcome  Self Feeding    Swallowing      Basic self-care  Supervision  Toileting  Supervision   Bathroom Transfers Supervision  Bowel/Bladder  Mod I assist  Transfers  Independent  Locomotion  Supervision w/ LRAD.  Communication  Min A  Cognition  Mod A  Pain  < 3  Safety/Judgment      Therapy Plan: PT Intensity: Minimum of 1-2 x/day ,45 to 90 minutes PT Frequency: 5 out of 7 days PT Duration Estimated Length of Stay: 5-7 days OT Intensity: Minimum of 1-2 x/day, 45 to 90 minutes OT Frequency: 5 out of 7 days OT Duration/Estimated Length of Stay: 5-7 days SLP Intensity: Minumum of 1-2 x/day, 30 to 90 minutes SLP Frequency: 3 to 5 out of 7 days SLP Duration/Estimated Length of Stay: 7 days    Team Interventions: Nursing Interventions Patient/Family Education, Bowel Management, Disease Management/Prevention, Discharge Planning, Medication Management  PT interventions Ambulation/gait training, Discharge planning, Therapeutic  Activities, Balance/vestibular training, Neuromuscular re-education, Therapeutic Exercise, DME/adaptive equipment instruction, UE/LE Strength taining/ROM, Community reintegration, Barrister's clerk education, IT trainer, UE/LE Coordination activities  OT Interventions Training and development officer, Cognitive remediation/compensation, Academic librarian, Discharge planning, Disease mangement/prevention, Functional mobility training, Pain management, Patient/family education, Psychosocial support, Self Care/advanced ADL retraining, Therapeutic Activities, Therapeutic Exercise  SLP Interventions Cognitive remediation/compensation, Functional tasks, Therapeutic Activities, Internal/external aids, Patient/family education, Medication managment  TR Interventions    SW/CM Interventions Discharge Planning, Psychosocial Support, Patient/Family Education   Barriers to Discharge MD  Medical stability and Cognition  Nursing      PT Other (comments) decreased safety awareness, memory deficits  OT Decreased caregiver support wife to take [name]  SLP      SW       Team Discharge Planning: Destination: PT-Home ,OT- Home , SLP-Home Projected Follow-up: PT-Home health PT, OT-  Home health OT, 24 hour supervision/assistance, SLP-Home Health SLP, 24 hour supervision/assistance Projected Equipment Needs: PT-Rolling walker with 5" wheels, Cane, OT- Tub/shower seat, SLP-None recommended by SLP Equipment Details: PT-AD to be determined based on progress and safety., OT-  Patient/family involved in discharge planning: PT- Patient,  OT-Patient, SLP-Patient  MD ELOS: 5-7 days. Medical Rehab Prognosis:  Good Assessment: 64 year old male with history of OSA, retinopathy - getting injections in his eyes, NICM, VT/A flutter s/p ablation 04/2019, ESRD with HD MWF who was admitted on 05/29/19 for AV fistula v/s graft placement due to problems with piror access. Prior to initiation of procedure, patient sustained  cardiac arrest requiring CPR X 4 mintues  with ACLS protocol prior to ROSC.  Arrest felt to be respiratory in nature due to sedatives and rapid decline in compromised RV function as bedside echo reported to showglobal hypokinesis with EF 20%.  . He underwent HD to optimize volume status.  He had issues with agitation requiring sedation. EEG showed diffuse encephalopathy. CT head showed generalized volume loss without acute changes. He tolerated extubation on 01/30 but continued to have cognitive deficits with slurred speech.  MRI brain showed mild brain atrophy and no signs of anoxic injury. Cardiology consulted and recommended addition of BB for runs of NSVT and EP consulted for input on ICD or life vest. He has had issues with agitation as well as cognitive deficits with delayed responses, poor safety awareness, fatigue and unsteady gait affecting mobility and ADLs. CIR recommended due to anoxic BI.  Will set goals for Supervision with PT/OT and Min A with SLP.   Due to the current state of emergency, patients may not be receiving their 3-hours of Medicare-mandated therapy.  See Team Conference Notes for weekly updates to the plan of care

## 2019-06-04 NOTE — Progress Notes (Signed)
Physical Therapy Treatment Patient Details Name: Michael [name]. [name]: [id number] DOB: [birth year] Today's Date: 06/04/2019    History of Present Illness 64 y/o male with a PMH of ESRD, HLD, HTN, OSA, V. Tach, and Hyperparathyroidism, presents s/p pre operative (R AV fistula) cardiac arrest.    PT Comments    Patient received in bed, eager to mobilize and excited about going to rehab. See below for mobility levels/assist. Continues to remain impulsive and required cues for safety throughout session, also continues to demonstrate considerable gait deviations requiring MinA and mod cues for safety and navigation in hallway. Did have one time oxygen desat to 87% on room air but recovered well also on RA with pursed lip breathing. Continues to have difficulty with safety awareness and reduced awareness of functional deficits. He was left in bed with all needs met, bed alarm active and family present this afternoon.    Follow Up Recommendations  CIR     Equipment Recommendations  Rolling walker with 5" wheels    Recommendations for Other Services       Precautions / Restrictions Precautions Precautions: Fall Restrictions Weight Bearing Restrictions: No    Mobility  Bed Mobility Overal bed mobility: Needs Assistance Bed Mobility: Supine to Sit;Sit to Supine     Supine to sit: Min guard Sit to supine: Min guard   General bed mobility comments: min cues to initiate bed mobility, increased time and effort from flat bed  Transfers Overall transfer level: Needs assistance Equipment used: Rolling walker (2 wheeled) Transfers: Sit to/from Stand Sit to Stand: Min assist         General transfer comment: MinA to boost to full upright standing position, cues for safety and sequencing/hand placement on RW  Ambulation/Gait Ambulation/Gait assistance: Min assist Gait Distance (Feet): 140 Feet(133f, then 222f Assistive device: Rolling walker (2 wheeled) Gait Pattern/deviations:  Drifts right/left;Trunk flexed;Step-through pattern;Decreased step length - right;Decreased step length - left Gait velocity: dec   General Gait Details: continues to require cues for upright posture as well as MinA for navigation of RW and avoidance of obstacles in environment; poor awareness of deficits and safety. Needed one rest break due to O2 desat to 87%, recovered well on room air. Continues to veer R and is impulsive, taking hands off of RW to get hand sanitizer or adjust mask.   Stairs             Wheelchair Mobility    Modified Rankin (Stroke Patients Only)       Balance Overall balance assessment: Needs assistance Sitting-balance support: No upper extremity supported;Feet supported Sitting balance-Leahy Scale: Good     Standing balance support: Bilateral upper extremity supported;During functional activity Standing balance-Leahy Scale: Poor Standing balance comment: reliant on external support, min A with initial standing to regain balance                            Cognition Arousal/Alertness: Awake/alert Behavior During Therapy: Impulsive Overall Cognitive Status: Impaired/Different from baseline Area of Impairment: Orientation;Memory;Safety/judgement;Problem solving                 Orientation Level: Time;Situation   Memory: Decreased short-term memory Following Commands: Follows one step commands with increased time;Follows one step commands inconsistently Safety/Judgement: Decreased awareness of safety;Decreased awareness of deficits   Problem Solving: Slow processing;Difficulty sequencing;Requires verbal cues;Requires tactile cues General Comments: patient more alert this session however still demonstrating cognitive deficits requiring multimodal cues  Exercises      General Comments General comments (skin integrity, edema, etc.): one time desat to 87% during gait, recovered quickly on RA. Otherwise VSS.      Pertinent  Vitals/Pain Pain Assessment: No/denies pain    Home Living                      Prior Function            PT Goals (current goals can now be found in the care plan section) Acute Rehab PT Goals Patient Stated Goal: To return to independent mobility PT Goal Formulation: With patient Time For Goal Achievement: 06/15/19 Potential to Achieve Goals: Good Progress towards PT goals: Progressing toward goals    Frequency    Min 3X/week      PT Plan Current plan remains appropriate    Co-evaluation              AM-PAC PT "6 Clicks" Mobility   Outcome Measure  Help needed turning from your back to your side while in a flat bed without using bedrails?: A Little Help needed moving from lying on your back to sitting on the side of a flat bed without using bedrails?: A Little Help needed moving to and from a bed to a chair (including a wheelchair)?: A Little Help needed standing up from a chair using your arms (e.g., wheelchair or bedside chair)?: A Little Help needed to walk in hospital room?: A Little Help needed climbing 3-5 steps with a railing? : A Lot 6 Click Score: 17    End of Session Equipment Utilized During Treatment: Gait belt Activity Tolerance: Patient tolerated treatment well Patient left: in bed;with call bell/phone within reach;with bed alarm set   PT Visit Diagnosis: Unsteadiness on feet (R26.81)     Time: 3159-4585 PT Time Calculation (min) (ACUTE ONLY): 18 min  Charges:  $Gait Training: 8-22 mins                     Windell Norfolk, DPT, PN1   Supplemental Physical Therapist [place]    Pager [phone] Acute Rehab Office [phone]

## 2019-06-04 NOTE — Progress Notes (Signed)
[facility] Progress Note   Dialysis Orders: NW MWF 4.5h  400/800   79kg  2/2 bath  TDC    Hep 4000 - no esa active - last Hb 13.9 on 1/22 - parsabiv 2.5 mg 3 x/week - hectorol 2 mcg IV each tx  CXR 1/27 - vasc congestion   Assessment/Plan: # Cardiac arrest - s/p cooling protocol; - extubated now and awake. Improving mental status.   # ESRD via [facility] - gets MWF HD Next tomorrow. Should get below EDW.   # Acute hypoxic respiratory failure s/p vent: resolved, now on RA.  # Nonischemic cardiomyopathy - volume maintenance with HD.   # MBD - on binders/VDRA P 10.4 - has binders reordered - no change for now, check with next labs.   # nutrition - cont nepro/vits  # HTN - currently normotensive. CTM  # Anemia - hgb high - no ESA/Fe  Jannifer Hick MD [facility]   Subjective:   Seen in room. Calm and looking at cell phone.  Appears plan is CIR.   Objective Vitals:   06/03/19 1201 06/03/19 2230 06/04/19 0641 06/04/19 0820  BP: (!) 143/76  139/75 128/76  Pulse: 68  75 83  Resp: 18     Temp: 97.9 F (36.6 C) 97.7 F (36.5 C) 97.9 F (36.6 C)   TempSrc: Oral  Oral   SpO2: 100%  100% 99%  Weight:   80.2 kg   Height:       Physical Exam General: awake and alert Heart: irreg ~100 this AM Lungs: normal WOB, scattered rhonchi L > R, no rales - improved from yesterday Abdomen: soft NT Extremities: trace to 1+ LE edema Dialysis Access: right IJ [facility] c/d/i   Additional Objective Labs: Basic Metabolic Panel: Recent Labs  Lab 05/30/19 0445 05/30/19 1654 06/02/19 0450 06/02/19 0450 06/02/19 2006 06/03/19 0552 06/04/19 0449  NA 139   < > 137   < > 137 139 137  K 4.5   < > 4.6   < > 4.9 5.0 4.2  CL 100   < > 96*   < > 94* 94* 98  CO2 25   < > 26   < > 25 26 26   GLUCOSE 64*   < > 90   < > 90 70 91  BUN 28*   < > 44*   < > 47* 51* 35*  CREATININE 7.54*   < > 9.71*   < > 10.59* 11.51* 9.67*  CALCIUM 8.8*   < > 9.1   < > 9.1 9.4 8.8*   PHOS 5.8*  --  10.4*  --   --   --   --    < > = values in this interval not displayed.   Liver Function Tests: Recent Labs  Lab 05/29/19 1628 06/01/19 0418 06/02/19 0450  AST 22 38  --   ALT 11 6  --   ALKPHOS 34* 50  --   BILITOT 0.9 1.5*  --   PROT 5.8* 6.0*  --   ALBUMIN 3.1* 2.8* 2.6*   No results for input(s): LIPASE, AMYLASE in the last 168 hours. CBC: Recent Labs  Lab 05/29/19 1628 05/29/19 1810 05/30/19 0445 05/30/19 0445 05/31/19 0639 06/01/19 0338 06/01/19 0500 06/03/19 0552 06/04/19 0449  WBC 6.9   < > 4.6   < > 4.8  --  6.1 4.1 3.9*  NEUTROABS 5.8  --   --   --   --   --   --   --   --  HGB 14.4   < > 14.2   < > 13.5   < > 14.5 12.2* 12.9*  HCT 48.9   < > 46.0   < > 42.9   < > 47.0 41.2 43.2  MCV 86.4   < > 82.7  --  80.8  --  81.9 85.8 85.4  PLT 179   < > 163   < > 150  --  170 135* 128*   < > = values in this interval not displayed.   Blood Culture No results found for: SDES, SPECREQUEST, CULT, REPTSTATUS  Cardiac Enzymes: No results for input(s): CKTOTAL, CKMB, CKMBINDEX, TROPONINI in the last 168 hours. CBG: Recent Labs  Lab 06/01/19 0416 06/01/19 0657 06/01/19 0809 06/01/19 1150 06/01/19 1533  GLUCAP 73 79 81 85 92   Iron Studies: No results for input(s): IRON, TIBC, TRANSFERRIN, FERRITIN in the last 72 hours. Lab Results  Component Value Date   INR 1.3 (H) 05/30/2019   INR 1.4 (H) 05/29/2019   INR 1.22 05/14/2013   Studies/Results: MR BRAIN WO CONTRAST  Result Date: 06/02/2019 CLINICAL DATA:  Cardiac arrest with abnormal neurologic status subsequent. EXAM: MRI HEAD WITHOUT CONTRAST TECHNIQUE: Multiplanar, multiecho pulse sequences of the brain and surrounding structures were obtained without intravenous contrast. COMPARISON:  CT 05/29/2019 FINDINGS: Brain: Diffusion imaging does not show any acute or subacute infarction. No sign of anoxic brain injury. Brainstem and cerebellum are normal. Cerebral hemispheres show mild age related  atrophy without small or large vessel infarction. No mass lesion, hemorrhage, hydrocephalus or extra-axial collection. Vascular: Major vessels at the base of the brain show flow. Skull and upper cervical spine: Negative Sinuses/Orbits: Clear/normal Other: None IMPRESSION: Mild cerebral hemispheric brain atrophy for age. Otherwise normal examination. No old or acute small or large vessel infarction. No evidence of diffuse hypoxic ischemic injury. Electronically Signed   By: Nelson Chimes M.D.   On: 06/02/2019 12:54   Medications: . sodium chloride     . acyclovir  400 mg Oral Q lunch  . aspirin EC  81 mg Oral Daily  . atorvastatin  40 mg Oral QHS  . Chlorhexidine Gluconate Cloth  6 each Topical Q0600  . dorzolamide-timolol  1 drop Both Eyes BID  . doxercalciferol  2 mcg Intravenous Q M,W,F-HD  . feeding supplement (NEPRO CARB STEADY)  237 mL Oral BID BM  . heparin  5,000 Units Subcutaneous Q8H  . mouth rinse  15 mL Mouth Rinse BID  . multivitamin  1 tablet Oral QHS  . pantoprazole  40 mg Oral Daily  . prednisoLONE acetate  1 drop Right Eye Q1200  . sevelamer carbonate  1,600 mg Oral TID WC  . sodium chloride flush  10-40 mL Intracatheter Q12H

## 2019-06-04 NOTE — PMR Pre-admission (Signed)
PMR Admission Coordinator Pre-Admission Assessment  Patient: Michael [name]. is an 64 y.o., male [name]: [id number] DOB: 05-Jun-1955 Height: _0  (167.6 cm) Weight: 80.2 kg  Insurance Information HMO:     PPO:      PCP:      IPA:      80/20: yes     OTHER:  PRIMARY: Medicare part A and B      Policy#: [id number]      Subscriber: Patient CM Name:       Phone#:      Fax#:  Pre-Cert#:       Employer:  Benefits:  Phone #: online     Name: verified online via [employer] on 06/04/19 Eff. Date: Part A and B effective 06/02/2014     Deduct: $1,484      Out of Pocket Max: NA      Life Max: NA CIR: Covered per Medicare guidelines      SNF: days 1-20, 100%, days 21-100, 80% Outpatient: 80%     Co-Pay: 20% Home Health: 100%      Co-Pay:  DME: 80%     Co-Pay: 20% Providers: Pt's choice SECONDARY: BCBS      Policy#: [id number]      Subscriber: Patient CM Name:       Phone#:      Fax#:  Pre-Cert#:       Employer:  Benefits:  Phone #: [phone]    Name:  Eff. Date:      Deduct:       Out of Pocket Max:       Life Max: CIR:       SNF:  Outpatient:      Co-Pay:  Home Health:       Co-Pay:  DME:      Co-Pay:  Tertiary: [place] #: [id number] Benefits #: [phone]  Medicaid Application Date:       Case Manager:  Disability Application Date:       Case Worker:   The "Data Collection Information Summary" for patients in Inpatient Rehabilitation Facilities with attached "Privacy Act [place] Records" was provided and verbally reviewed with: Patient and Family  Emergency Contact Information Contact Information    Name Relation Home Work [place] Spouse [phone]  [phone]      Current Medical History  Patient Admitting Diagnosis: Debility   History of Present Illness: Pt is a 64 yo Male with history of atrial arrhythmia, ESRD on HD MWF, hyperlipidemia, hyperparathyroidism, and HTN. Pt presented for surgery for fistula vs AV graft on 05/29/19 with  diagnosis of ESRD. During preop for the procedure, the patient became unresponsive and arrested. He received 4 mins of CPR, Epi x1, with ROSC. The patient was intubated, procedure cancelled and he was taken to critical care. The arrest was most likely secondary to respiratory failure due to sedation. Pt was extubated on 06/01/19 and transferred to the medical floor on 06/02/19. Pt appeared to have some slurred speech, right facial droop and left arm weakness concerning for stroke. MRI of the head was unremarkable. Cardiology was consulted due to observed bradycardia and tachycardia intermittently with Toprol-XL recommended. Echo to be done in CIR. Pt has been evaluated by therapies with recommendation for CIR. Pt is to admit to CIR on 06/04/19.     Patient's medical record from [facility] has been reviewed by the rehabilitation admission coordinator and physician.  Past Medical History  Past Medical History:  Diagnosis  Date  . Atrial arrhythmia    atrial tachycardia with variable AV conduction versus atypical aflutter 01/10/19, rate control (02/06/19)  . Cataract   . Dyspnea    on exertion  . ESRD (end stage renal disease) ([place])    Hemo- MWF, Polycystic kidney disease  . Fatigue   . History of kidney stones    removal of stone- cysto  . Hyperlipidemia   . Hyperparathyroidism, secondary renal ([place])   . Hypertension   . Hypoxemia 12/12/2013  . Nonischemic cardiomyopathy ([place])    Er 25% 2015, 55 % 2013  . OSA on CPAP    no longer using cpap  . OSA on CPAP 03/24/2014  . Pneumonia    2015ish  . Ventricular tachycardia//Freq PVCs   . Wears glasses     Family History   family history includes Heart disease in his mother; Hyperlipidemia in his mother; Hypertension in his mother; Kidney disease in his brother and father; Stroke in his father.  Prior Rehab/Hospitalizations Has the patient had prior rehab or hospitalizations prior to admission? Yes  Has the patient had major  surgery during 100 days prior to admission? No   Current Medications  Current Facility-Administered Medications:  .  0.9 %  sodium chloride infusion, 100 mL, Intravenous, PRN, Byrum, Rose Fillers, MD .  acetaminophen (TYLENOL) tablet 650 mg, 650 mg, Oral, Q6H PRN, Collene Gobble, MD .  acyclovir (ZOVIRAX) tablet 400 mg, 400 mg, Oral, Q lunch, Byrum, Rose Fillers, MD, 400 mg at 06/04/19 1232 .  albuterol (PROVENTIL) (2.5 MG/3ML) 0.083% nebulizer solution 2.5 mg, 2.5 mg, Nebulization, Q2H PRN, Collene Gobble, MD, 2.5 mg at 05/29/19 1840 .  aspirin EC tablet 81 mg, 81 mg, Oral, Daily, Pahwani, Ravi, MD, 81 mg at 06/04/19 1011 .  atorvastatin (LIPITOR) tablet 40 mg, 40 mg, Oral, QHS, Collene Gobble, MD, 40 mg at 06/03/19 2117 .  Chlorhexidine Gluconate Cloth 2 % PADS 6 each, 6 each, Topical, Q0600, Alric Seton, PA-C, 6 each at 06/04/19 [phone] .  dorzolamide-timolol (COSOPT) 22.3-6.8 MG/ML ophthalmic solution 1 drop, 1 drop, Both Eyes, BID, Byrum, Rose Fillers, MD, 1 drop at 06/04/19 1012 .  doxercalciferol (HECTOROL) injection 2 mcg, 2 mcg, Intravenous, Q M,W,F-HD, Collene Gobble, MD, 2 mcg at 06/03/19 [phone] .  feeding supplement (NEPRO CARB STEADY) liquid 237 mL, 237 mL, Oral, BID BM, Alric Seton, PA-C, 237 mL at 06/04/19 1012 .  heparin injection 5,000 Units, 5,000 Units, Subcutaneous, Q8H, Karren Cobble, RPH, 5,000 Units at 06/03/19 2117 .  LORazepam (ATIVAN) injection 1 mg, 1 mg, Intravenous, Q4H PRN, Elsie Lincoln, MD, 1 mg at 06/02/19 1215 .  MEDLINE mouth rinse, 15 mL, Mouth Rinse, BID, Byrum, Rose Fillers, MD, 15 mL at 06/04/19 1236 .  metoprolol succinate (TOPROL-XL) 24 hr tablet 12.5 mg, 12.5 mg, Oral, BID, Furth, Cadence H, PA-C .  multivitamin (RENA-VIT) tablet 1 tablet, 1 tablet, Oral, QHS, Alric Seton, PA-C, 1 tablet at 06/03/19 2117 .  pantoprazole (PROTONIX) EC tablet 40 mg, 40 mg, Oral, Daily, Collene Gobble, MD, 40 mg at 06/04/19 1011 .  pentafluoroprop-tetrafluoroeth  (GEBAUERS) aerosol 1 application, 1 application, Topical, PRN, Byrum, Rose Fillers, MD .  prednisoLONE acetate (PRED FORTE) 1 % ophthalmic suspension 1 drop, 1 drop, Right Eye, Q1200, Collene Gobble, MD, 1 drop at 06/04/19 1011 .  senna (SENOKOT) tablet 8.6 mg, 1 tablet, Oral, BID PRN, Collene Gobble, MD .  sevelamer carbonate (RENVELA) tablet 1,600 mg, 1,600 mg, Oral,  TID WC, Collene Gobble, MD, 1,600 mg at 06/04/19 1232 .  sodium chloride flush (NS) 0.9 % injection 10-40 mL, 10-40 mL, Intracatheter, Q12H, Byrum, Rose Fillers, MD, 10 mL at 06/03/19 2200 .  sodium chloride flush (NS) 0.9 % injection 10-40 mL, 10-40 mL, Intracatheter, PRN, Byrum, Rose Fillers, MD  Patients Current Diet:  Diet Order            DIET SOFT Room service appropriate? Yes; Fluid consistency: Thin; Fluid restriction: 1500 mL Fluid  Diet effective now              Precautions / Restrictions Precautions Precautions: Fall Restrictions Weight Bearing Restrictions: No   Has the patient had 2 or more falls or a fall with injury in the past year? No  Prior Activity Level Community (5-7x/wk): very active PTA. no AD use PTA. Pt is retired but still drives and is active and Independent  Prior Functional Level Self Care: Did the patient need help bathing, dressing, using the toilet or eating? Independent  Indoor Mobility: Did the patient need assistance with walking from room to room (with or without device)? Independent  Stairs: Did the patient need assistance with internal or external stairs (with or without device)? Independent  Functional Cognition: Did the patient need help planning regular tasks such as shopping or remembering to take medications? Independent  Home Assistive Devices / Equipment Home Assistive Devices/Equipment: Eyeglasses, Blood pressure cuff, Grab bars in shower, Hand-held shower hose, Scales Home Equipment: Cane - single point  Prior Device Use: Indicate devices/aids used by the patient prior to  current illness, exacerbation or injury? None of the above  Current Functional Level Cognition  Overall Cognitive Status: Impaired/Different from baseline Orientation Level: Oriented to person, Oriented to place, Oriented to situation Following Commands: Follows one step commands with increased time, Follows one step commands inconsistently Safety/Judgement: Decreased awareness of safety, Decreased awareness of deficits General Comments: patient more alert this session however still demonstrating cognitive deficits requiring multimodal cues    Extremity Assessment (includes Sensation/Coordination)  Upper Extremity Assessment: Generalized weakness  Lower Extremity Assessment: Generalized weakness    ADLs  Overall ADL's : Needs assistance/impaired Eating/Feeding: Maximal assistance, Bed level Grooming: Maximal assistance, Bed level Upper Body Bathing: Maximal assistance, Sitting, Bed level Lower Body Bathing: Maximal assistance, +2 for physical assistance, Sitting/lateral leans, Sit to/from stand Upper Body Dressing : Maximal assistance, Sitting, Bed level Lower Body Dressing: Maximal assistance, +2 for physical assistance, Sitting/lateral leans, Sit to/from stand Toilet Transfer: Moderate assistance, Regular Toilet, RW, Ambulation, Grab bars, Cueing for safety, Cueing for sequencing Toilet Transfer Details (indicate cue type and reason): decreased carry over of hand placement not to pull on walker when standing, use grab bar. decreased eccentric control with sitting Toileting- Clothing Manipulation and Hygiene: Moderate assistance Toileting - Clothing Manipulation Details (indicate cue type and reason): managing gown when sitting Functional mobility during ADLs: Minimal assistance, Moderate assistance, Rolling walker, Cueing for safety, Cueing for sequencing General ADL Comments: patient demonstrates increased arousal with improved functional mobility however still high fall risk due to  lateral and posterior loss of balance, especially when turning the walker    Mobility  Overal bed mobility: Needs Assistance Bed Mobility: Supine to Sit, Sit to Supine Rolling: Mod assist Sidelying to sit: Mod assist Supine to sit: Min guard, HOB elevated Sit to supine: Min guard General bed mobility comments: min cues to initiate bed mobility    Transfers  Overall transfer level: Needs assistance Equipment used: Rolling  walker (2 wheeled) Transfers: Sit to/from Stand Sit to Stand: Mod assist, Min assist General transfer comment: min A from edge of bed with cues for hand placement, mod A from toilet due to low surface    Ambulation / Gait / Stairs / Wheelchair Mobility  Ambulation/Gait Ambulation/Gait assistance: Mod assist, +2 safety/equipment(chair follow/line management) Gait Distance (Feet): 60 Feet(x1, 100'x1) Assistive device: Rolling walker (2 wheeled) Gait Pattern/deviations: Step-to pattern, Drifts right/left, Trunk flexed General Gait Details: pt with increased trunk flexion, can achieve full upright posture with verbal cues but has difficulty maintaining it, pt with vearing to the R especially with onset of fatigue, pt with desire to keep ambulating despite tripping over feet and running into obstacles and requiring more assist Gait velocity: dec Gait velocity interpretation: <1.8 ft/sec, indicate of risk for recurrent falls    Posture / Balance Dynamic Sitting Balance Sitting balance - Comments: assist for sitting upright and cues to hold self up Balance Overall balance assessment: Needs assistance Sitting-balance support: No upper extremity supported, Feet supported Sitting balance-Leahy Scale: Good Sitting balance - Comments: assist for sitting upright and cues to hold self up Standing balance support: Bilateral upper extremity supported, During functional activity Standing balance-Leahy Scale: Poor Standing balance comment: reliant on external support, min A with  initial standing to regain balance    Special needs/care consideration BiPAP/CPAP: no CPM : no Continuous Drip IV : no Dialysis: yes       Days : MWF Life Vest : no Oxygen : not during day, uses O2 during HD treatment per report, and wife states he may need it during night Special Bed : no Trach Size : no Wound Vac (area) : no      Location : no Skin: ecchymosis to left mid, skin tear to mid medial back                         Bowel mgmt: last BM 05/29/19 Bladder mgmt: incontinent Diabetic mgmt: no Behavioral consideration : some confusion during hospital stay. Chemo/radiation : no   Previous Home Environment (from acute therapy documentation) Living Arrangements: Spouse/significant other, Children Available Help at Discharge: Family, Available 24 hours/day Type of Home: House Home Layout: One level Home Access: Stairs to enter Entrance Stairs-Rails: Right, Left Entrance Stairs-Number of Steps: 4 Bathroom Shower/Tub: Chiropodist: Handicapped height Home Care Services: No Additional Comments: per family/chart as pt unable to answer due to lethargy  Discharge Living Setting Plans for Discharge Living Setting: Patient's home, Lives with (comment)(wife and 52 yo son ) Type of Home at Discharge: House Discharge Home Layout: One level Discharge Home Access: Stairs to enter Entrance Stairs-Rails: Can reach both Entrance Stairs-Number of Steps: 3 Discharge Bathroom Shower/Tub: Other (comment)(pt sponge bathes due to port) Discharge Bathroom Toilet: Handicapped height Discharge Bathroom Accessibility: Yes How Accessible: Accessible via walker Does the patient have any problems obtaining your medications?: No  Social/Family/Support Systems Patient Roles: Spouse, Parent(parent to 51 yo son with Autism) Contact Information: wife: [name] [phone]) Anticipated Caregiver: wife plans to take FMLA.  Anticipated Caregiver's Contact Information: see  above Ability/Limitations of Caregiver: Supervision  Caregiver Availability: 24/7 Discharge Plan Discussed with Primary Caregiver: Yes(pt and his wife) Is Caregiver In Agreement with Plan?: Yes Does Caregiver/Family have Issues with Lodging/Transportation while Pt is in Rehab?: No  Goals/Additional Needs Patient/Family Goal for Rehab: PT/OT/SLP: Supervision Expected length of stay: 7-10 days Cultural Considerations: NA Dietary Needs: soft diet, thin liquids; fluid restriction  1500 mL fluid Equipment Needs: TBD Special Service Needs: HD MWF Pt/Family Agrees to Admission and willing to participate: Yes Program Orientation Provided & Reviewed with Pt/Caregiver Including Roles  & Responsibilities: Yes(pt and his wife)  Barriers to Discharge: Home environment access/layout, Hemodialysis  Barriers to Discharge Comments: steps to enter. on HD MWF, has been clipped for years.   Decrease burden of Care through IP rehab admission: NA  Possible need for SNF placement upon discharge: Not anticipated. Pt has good support from family at DC. With clearing mentation, pt has good prognosis for further progress through CIR. Anticipate pt can reach supervision goals in a short period of time.   Patient Condition: I have reviewed medical records from [facility], spoken with MD, RN, and patient and spouse. I met with patient at the bedside for inpatient rehabilitation assessment.  Patient will benefit from ongoing PT, OT and SLP, can actively participate in 3 hours of therapy a day 5 days of the week, and can make measurable gains during the admission.  Patient will also benefit from the coordinated team approach during an Inpatient Acute Rehabilitation admission.  The patient will receive intensive therapy as well as Rehabilitation physician, nursing, social worker, and care management interventions.  Due to safety, skin/wound care, disease management, medication administration, pain management  and patient education the patient requires 24 hour a day rehabilitation nursing.  The patient is currently Min/Mod A for transfers and Mod A +2 60 feet with RW for mobility and Min/Mod A for basic ADLs.  Discharge setting and therapy post discharge at home with home health is anticipated.  Patient has agreed to participate in the Acute Inpatient Rehabilitation Program and will admit 06/04/19.  Preadmission Screen Completed By:  Raechel Ache, 06/04/2019 12:44 PM ______________________________________________________________________   Discussed status with Dr. Ranell Patrick on 06/04/19 at 12:43PM and received approval for admission today.  Admission Coordinator:  Raechel Ache, OT, time 12:43PM/Date 06/04/19.   Assessment/Plan: Diagnosis: Cardiac debility 1. Does the need for close, 24 hr/day Medical supervision in concert with the patient's rehab needs make it unreasonable for this patient to be served in a less intensive setting? Yes 2. Co-Morbidities requiring supervision/potential complications: hypotension, hypoxia, s/p cardiac arrest, ESRD, acute hypoxic respiratory failure s/p vent, nonischecmic cardiomyopathy, HTN, anemia 3. Due to safety, skin/wound care, disease management, medication administration, pain management and patient education, does the patient require 24 hr/day rehab nursing? Yes and No 4. Does the patient require coordinated care of a physician, rehab nurse, PT, OT, and SLP to address physical and functional deficits in the context of the above medical diagnosis(es)? Yes Addressing deficits in the following areas: balance, endurance, locomotion, strength, transferring, bathing, dressing, feeding, grooming, toileting and psychosocial support 5. Can the patient actively participate in an intensive therapy program of at least 3 hrs of therapy 5 days a week? Yes 6. The potential for patient to make measurable gains while on inpatient rehab is excellent 7. Anticipated functional outcomes upon  discharge from inpatient rehab: modified independent PT, modified independent OT, independent SLP 8. Estimated rehab length of stay to reach the above functional goals is: 12-16 days 9. Anticipated discharge destination: Home 10. Overall Rehab/Functional Prognosis: excellent   MD Signature: Leeroy Cha, MD

## 2019-06-04 NOTE — Progress Notes (Addendum)
Inpatient Rehabilitation-Admissions Coordinator   Received medical clearance from Dr. Doristine Bosworth for admit to CIR. The patient is still on board with pursuing IP Rehab. Will meet with pt and his wife at 1pm to review consent forms and insurance benefit letter. Will notify RN and [facility] team of plan for today.   Please call if questions.   Raechel Ache, OTR/L  Rehab Admissions Coordinator  [phone] 06/04/2019 12:27 PM   ADDENDUM 1:40PM: Consent forms signed and insurance benefits letter reviewed. RN and [facility] team aware of plan for admit today.   Raechel Ache, OTR/L  Rehab Admissions Coordinator  [phone] 06/04/2019 1:41 PM

## 2019-06-05 ENCOUNTER — Inpatient Hospital Stay (HOSPITAL_COMMUNITY): Payer: Medicare Other | Admitting: Occupational Therapy

## 2019-06-05 ENCOUNTER — Inpatient Hospital Stay (HOSPITAL_COMMUNITY): Payer: Medicare Other | Admitting: Physical Therapy

## 2019-06-05 ENCOUNTER — Inpatient Hospital Stay (HOSPITAL_COMMUNITY): Payer: Medicare Other | Admitting: Speech Pathology

## 2019-06-05 DIAGNOSIS — D696 Thrombocytopenia, unspecified: Secondary | ICD-10-CM

## 2019-06-05 DIAGNOSIS — Z992 Dependence on renal dialysis: Secondary | ICD-10-CM

## 2019-06-05 DIAGNOSIS — G931 Anoxic brain damage, not elsewhere classified: Secondary | ICD-10-CM

## 2019-06-05 DIAGNOSIS — Z8679 Personal history of other diseases of the circulatory system: Secondary | ICD-10-CM

## 2019-06-05 DIAGNOSIS — N186 End stage renal disease: Secondary | ICD-10-CM

## 2019-06-05 DIAGNOSIS — D72819 Decreased white blood cell count, unspecified: Secondary | ICD-10-CM

## 2019-06-05 DIAGNOSIS — D638 Anemia in other chronic diseases classified elsewhere: Secondary | ICD-10-CM

## 2019-06-05 LAB — DIFFERENTIAL
Abs Immature Granulocytes: 0.03 10*3/uL (ref 0.00–0.07)
Basophils Absolute: 0 10*3/uL (ref 0.0–0.1)
Basophils Relative: 1 %
Eosinophils Absolute: 0.4 10*3/uL (ref 0.0–0.5)
Eosinophils Relative: 9 %
Immature Granulocytes: 1 %
Lymphocytes Relative: 12 %
Lymphs Abs: 0.5 10*3/uL — ABNORMAL LOW (ref 0.7–4.0)
Monocytes Absolute: 0.6 10*3/uL (ref 0.1–1.0)
Monocytes Relative: 12 %
Neutro Abs: 3 10*3/uL (ref 1.7–7.7)
Neutrophils Relative %: 65 %

## 2019-06-05 LAB — CBC
HCT: 46.7 % (ref 39.0–52.0)
Hemoglobin: 14.4 g/dL (ref 13.0–17.0)
MCH: 25.6 pg — ABNORMAL LOW (ref 26.0–34.0)
MCHC: 30.8 g/dL (ref 30.0–36.0)
MCV: 83.1 fL (ref 80.0–100.0)
Platelets: 180 10*3/uL (ref 150–400)
RBC: 5.62 MIL/uL (ref 4.22–5.81)
RDW: 19.1 % — ABNORMAL HIGH (ref 11.5–15.5)
WBC: 4.6 10*3/uL (ref 4.0–10.5)
nRBC: 0 % (ref 0.0–0.2)

## 2019-06-05 LAB — RENAL FUNCTION PANEL
Albumin: 2.9 g/dL — ABNORMAL LOW (ref 3.5–5.0)
Anion gap: 16 — ABNORMAL HIGH (ref 5–15)
BUN: 46 mg/dL — ABNORMAL HIGH (ref 8–23)
CO2: 24 mmol/L (ref 22–32)
Calcium: 9.3 mg/dL (ref 8.9–10.3)
Chloride: 97 mmol/L — ABNORMAL LOW (ref 98–111)
Creatinine, Ser: 11.14 mg/dL — ABNORMAL HIGH (ref 0.61–1.24)
GFR calc Af Amer: 5 mL/min — ABNORMAL LOW (ref >60)
GFR calc non Af Amer: 4 mL/min — ABNORMAL LOW (ref >60)
Glucose, Bld: 109 mg/dL — ABNORMAL HIGH (ref 70–99)
Phosphorus: 6.2 mg/dL — ABNORMAL HIGH (ref 2.5–4.6)
Potassium: 4.4 mmol/L (ref 3.5–5.1)
Sodium: 137 mmol/L (ref 135–145)

## 2019-06-05 MED ORDER — NEPRO/CARBSTEADY PO LIQD
237.0000 mL | Freq: Two times a day (BID) | ORAL | Status: DC
  Administered 2019-06-06 – 2019-06-10 (×4): 237 mL via ORAL

## 2019-06-05 MED ORDER — SODIUM CHLORIDE 0.9% FLUSH
10.0000 mL | INTRAVENOUS | Status: DC | PRN
  Administered 2019-06-06: 10 mL
  Administered 2019-06-08: 21:00:00 30 mL

## 2019-06-05 MED ORDER — DOXERCALCIFEROL 4 MCG/2ML IV SOLN
INTRAVENOUS | Status: AC
  Administered 2019-06-05: 2 ug via INTRAVENOUS
  Filled 2019-06-05: qty 2

## 2019-06-05 MED ORDER — HEPARIN SODIUM (PORCINE) 1000 UNIT/ML IJ SOLN
INTRAMUSCULAR | Status: AC
  Administered 2019-06-05: 3800 [IU]
  Filled 2019-06-05: qty 4

## 2019-06-05 NOTE — Progress Notes (Signed)
Patient ID: Michael [name]., male   DOB: 12/24/1955, 64 y.o.   [name]: [id number]    SW met with pt in room to provide updates from rehab team, and anticipated d/c date 06/12/2019. Pt aware SW to call his wife to provide updates.  SW called pt wife Michael [name] [phone] to inform on above. SW reiterated that while patient's physical goal is 24/7 care, pt will require 24/7care due to cognitive deficits. She reports that she has realized changes as he is unable to remember some things. SW also informed unable to locate where her FMLA forms were faxed, and encouraged her to bring in. Reports she will be here to visit pt today and will bring FMLA forms. SW informed there will be continued follow-up as pt d/c date approaches.   SW will continue to assess pt for discharge needs.   Loralee Pacas, MSW, Social Worker Office (preferred): [phone] Cell: [phone] Fax: [phone]

## 2019-06-05 NOTE — Progress Notes (Signed)
Social Work Assessment and Plan   Patient Details  Name: Michael [name]. [name]: [id number] Date of Birth: 1956/04/14  Today's Date: 06/05/2019  Problem List:  Patient Active Problem List   Diagnosis Date Noted  . Leukopenia   . Thrombocytopenia ([place])   . Anemia of chronic disease   . History of ventricular tachycardia   . Anoxic brain injury ([place]) 06/04/2019  . Status post creation of arteriovenous fistula 05/29/2019  . Cardiac arrest ([place]) 05/29/2019  . Unspecified atrial flutter ([place]) 05/21/2019  . Atrial tachycardia ([place]) 02/06/2019  . Pseudoaneurysm of AV hemodialysis fistula, initial encounter ([place]) 04/14/2017  . Status post right inguinal hernia repair-July 2017 11/06/2015  . ESRD on dialysis ([place]) 09/15/2015  . Hypersomnia with sleep apnea 09/15/2015  . Bradycardia, drug induced 09/15/2015  . OSA on CPAP 03/24/2014  . Hypoxemia 12/12/2013  . Hypersomnia with sleep apnea, unspecified 12/12/2013  . PVC's // RBBB sup Axis 05/15/2013  . Nonischemic cardiomyopathy ([place])   . VT (ventricular tachycardia) ([place]) 05/13/2013   Past Medical History:  Past Medical History:  Diagnosis Date  . Atrial arrhythmia    atrial tachycardia with variable AV conduction versus atypical aflutter 01/10/19, rate control (02/06/19)  . Cataract   . Dyspnea    on exertion  . ESRD (end stage renal disease) ([place])    Hemo- MWF, Polycystic kidney disease  . Fatigue   . History of kidney stones    removal of stone- cysto  . Hyperlipidemia   . Hyperparathyroidism, secondary renal ([place])   . Hypertension   . Hypoxemia 12/12/2013  . Nonischemic cardiomyopathy ([place])    Er 25% 2015, 55 % 2013  . OSA on CPAP    no longer using cpap  . OSA on CPAP 03/24/2014  . Pneumonia    2015ish  . Ventricular tachycardia//Freq PVCs   . Wears glasses    Past Surgical History:  Past Surgical History:  Procedure Laterality Date  . A-FLUTTER ABLATION N/A 04/11/2019   Procedure: A-FLUTTER ABLATION;  Surgeon:  Evans Lance, MD;  Location: [facility];  Service: Cardiovascular;  Laterality: N/A;  . A/V FISTULAGRAM Left 04/27/2017   Procedure: A/V FISTULAGRAM;  Surgeon: Conrad Stratford, MD;  Location: [facility];  Service: Cardiovascular;  Laterality: Left;  lt arm  . A/V FISTULAGRAM Left 01/10/2019   Procedure: A/V FISTULAGRAM;  Surgeon: Marty Heck, MD;  Location: [facility];  Service: Cardiovascular;  Laterality: Left;  . APPENDECTOMY    . AV FISTULA PLACEMENT  12/05/2011   Procedure: ARTERIOVENOUS (AV) FISTULA CREATION;LLEFT ARM  Surgeon: Conrad [place], MD;  Location: [facility];  Service: Vascular;  Laterality: Left;  RADIO-CEPHALIC  fistula left arm  . AV FISTULA PLACEMENT  01/11/2012   Procedure: ARTERIOVENOUS (AV) FISTULA CREATION;  Surgeon: Conrad [place], MD;  Location: [facility];  Service: Vascular;  Laterality: Left;  Creation of left brachial cephalic arteriovenous fistula  . AV FISTULA PLACEMENT Right 03/07/2019   Procedure: ARTERIOVENOUS (AV) FISTULA CREATION  RIGHT ARM;  Surgeon: Marty Heck, MD;  Location: [facility];  Service: Vascular;  Laterality: Right;  . BASCILIC VEIN TRANSPOSITION Left 12/27/2016   Procedure: BASILIC VEIN TRANSPOSITION LEFT UPPER ARM FIRST STAGE;  Surgeon: Conrad Broadus, MD;  Location: [facility];  Service: Vascular;  Laterality: Left;  . BASCILIC VEIN TRANSPOSITION Left 01/31/2017   Procedure: LEFT ARM BASILIC VEIN TRANSPOSITION, SECOND STAGE;  Surgeon: Conrad , MD;  Location: [facility];  Service: Vascular;  Laterality: Left;  . CARDIAC CATHETERIZATION  04-05-2010   checking for blockage but none-[facility]  . COLONOSCOPY    . CYSTOSCOPY W/ STONE MANIPULATION     "laser once" (01/22/2013)  . HEMATOMA EVACUATION Left 05/09/2017   Procedure: EVACUATION HEMATOMA LEFT ARM;  Surgeon: Conrad Sebastian, MD;  Location: [facility];  Service: Vascular;  Laterality: Left;  . HERNIA REPAIR    . INGUINAL HERNIA REPAIR Right 11/06/2015   Procedure: OPEN HERNIA REPAIR  RIGHT  INGUINAL ADULT;  Surgeon: [name], MD;  Location[facility];  Service: General;  Laterality: Right;  with MESH  . INSERTION OF DIALYSIS CATHETER Right 10/05/2016   Procedure: INSERTION OF right internal jugular DIALYSIS CATHETER;  Surgeon: [name], MD;  Location: [facility];  Service: Vascular;  Laterality: Right;  . INSERTION OF MESH  03/20/2012   Procedure: INSERTION OF MESH;  UMB Surgeon: [name], MD;  Location: [facility];  Service: General;  Laterality: N/A;  . INSERTION OF MESH N/A 01/22/2013   Procedure: INSERTION OF MESH;  Surgeon: [name], MD;  Location: [facility];  Service: General;  Laterality: N/A;  . LAPAROTOMY  04/02/2012   Procedure: EXPLORATORY LAPAROTOMY;  Surgeon: [name], MD;  Location: [facility];  Service: General;  Laterality: N/A;  Exploratory Laparotomy with resection of small intestine  . LEFT HEART CATHETERIZATION WITH CORONARY ANGIOGRAM N/A 05/14/2013   Procedure: LEFT HEART CATHETERIZATION WITH CORONARY ANGIOGRAM;  Surgeon: Sinclair Grooms, MD;  Location: [facility];  Service: Cardiovascular;  Laterality: N/A;  . LIGATION OF ARTERIOVENOUS  FISTULA Left 12/27/2016   Procedure: LIGATION/EXCISION OF LEFT UPPER ARM ARTERIOVENOUS  FISTULA;  EVACUATION OF HEMATOMA;  Surgeon: Conrad [place], MD;  Location: [facility];  Service: Vascular;  Laterality: Left;  . LIGATION OF ARTERIOVENOUS  FISTULA Left 03/07/2019   Procedure: LIGATION OF ARTERIOVENOUS FISTULA  LEFT UPPER ARM;  Surgeon: Marty Heck, MD;  Location: [facility];  Service: Vascular;  Laterality: Left;  . REVISON OF ARTERIOVENOUS FISTULA Left 10/05/2016   Procedure: REVISON OF left arm ARTERIOVENOUS FISTULA;  Surgeon: [name], MD;  Location: [facility];  Service: Vascular;  Laterality: Left;  . TONSILLECTOMY    . UMBILICAL HERNIA REPAIR  03/20/2012   Procedure: HERNIA REPAIR UMBILICAL ADULT;  Surgeon: [name], MD;  Location: [facility];  Service: General;  Laterality: N/A;  . UMBILICAL HERNIA REPAIR   01/22/2013   preperitoneal open procedure due to significant adhesions/notes 01/22/2013  . VENTRAL HERNIA REPAIR N/A 01/22/2013   Procedure: ATTEMPTED LAPAROSCOPIC VENTRAL HERNIA CONVERTED TO OPEN;  Surgeon: [name], MD;  Location: [facility];  Service: General;  Laterality: N/A;   Social History:  reports that he has never smoked. He has never used smokeless tobacco. He reports that he does not drink alcohol or use drugs.  Family / Support Systems Marital Status: Married How Long?: 12 years Patient Roles: Spouse Spouse/Significant Other: Evertt Chouinard [phone]) Children: 30 y.o. son in the home Other Supports: Pt primary caregiver will be his wife [place]. Pt will have 24/7 care from wife and son. Anticipated Caregiver: Wife- [name] Ability/Limitations of Caregiver: None reported Caregiver Availability: 24/7 Family Dynamics: Pt lives in the home with his wife and 61 y.o. 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 Education: [employer] graduate Read: Yes Write: Yes Employment Status:  Retired Date Retired/Disabled/Unemployed: Pt reports he retired in 2014 due to kidney failure Age Retired: 56 Public relations account executive Issues: Denies any Guardian/Conservator: N/A   Abuse/Neglect Abuse/Neglect Assessment Can Be Completed: Yes Physical Abuse: Denies Verbal Abuse: Denies Sexual Abuse: Denies Exploitation of patient/patient's resources: Denies Self-Neglect: Denies  Emotional Status Pt's affect, behavior and adjustment status: Pt has pleasant mood. Pt goal is to "walk out of here." Recent Psychosocial Issues: None reported Psychiatric History: Denies Substance Abuse History: Denies  Patient / Family Perceptions, Expectations & Goals Pt/Family understanding of illness & functional limitations: Pt and family have a general  understanding of pt condition and prepared to provide care. Premorbid pt/family roles/activities: PTA pt was independent. Pt drove self to/from dialysis Anticipated changes in roles/activities/participation: Pt may require assistance to/from dialysis. Pt/family expectations/goals: Pt goal is to "walk out of here."  [employer]: None Premorbid Home Care/DME Agencies: None Transportation available at discharge: Wife to transport to home  Discharge Planning Living Arrangements: Spouse/significant other, Children Support Systems: Spouse/significant other, Children Type of Residence: Private residence Insurance Resources: [employer], Multimedia programmer (specify)(BCBS and Administrator, Civil Service) Financial Resources: SSD Financial Screen Referred: No Living Expenses: Own Money Management: Patient, Spouse Does the patient have any problems obtaining your medications?: No Home Management: will continue manage as they were pta Patient/Family Preliminary Plans: Provide 24/7 care to meet pt care needs. DC Planning Additional Notes/Comments: Discharge to home  Clinical Impression SW met with pt in room at bedside to introduce self, explain role, and discuss discharge process. Pt wife [name] [phone]) was conference called during assessment. SW confirmed FACESHEET as accurate. Wife confirms d/c plan is to provide 24/7 care and she will use FMLA forms. She was at work, and did not recall who she sent the forms too. Pt denies jail/prison hx. Denies tobacco product use, EtoH, recreational drug  Use, and MH hx/hospitalizations. Pt states he has no DME. Pt attends [employer] in [place] (M/W/F).   Loralee Pacas, MSW, Social Worker Office (preferred): [phone] Cell: [phone] Fax: [phone] 06/05/2019, 10:34 AM

## 2019-06-05 NOTE — Progress Notes (Signed)
Asked patient to call for assistance with needs in the room rather than getting up on his own. He verbalized understanding and agreement.

## 2019-06-05 NOTE — Care Management (Signed)
[facility] Individual Statement of Services  Patient Name:  Michael [name].  Date:  06/05/2019  Welcome to the [facility].  Our goal is to provide you with an individualized program based on your diagnosis and situation, designed to meet your specific needs.  With this comprehensive rehabilitation program, you will be expected to participate in at least 3 hours of rehabilitation therapies Monday-Friday, with modified therapy programming on the weekends.  Your rehabilitation program will include the following services:  Physical Therapy (PT), Occupational Therapy (OT), Speech Therapy (ST), 24 hour per day rehabilitation nursing, Neuropsychology, Case Management (Social Worker), Rehabilitation Medicine, Nutrition Services and Pharmacy Services  Weekly team conferences will be held on Wednesdays to discuss your progress.  Your Social Worker will talk with you frequently to get your input and to update you on team discussions.  Team conferences with you and your family in attendance may also be held.  Expected length of stay: 5-7 days   Overall anticipated outcome: Supervision  Depending on your progress and recovery, your program may change. Your Social Worker will coordinate services and will keep you informed of any changes. Your Social Worker's name and contact numbers are listed  below.  The following services may also be recommended but are not provided by the [facility] will be made to provide these services after discharge if needed.  Arrangements include referral to agencies that provide these services.  Your insurance has been verified to be:  Medicare A/B, BCB, and [place]  Your primary doctor is:  [name]  Pertinent information will be shared with your doctor and your  insurance company.  Social Worker:  Loralee Pacas, MSW  Information discussed with and copy given to patient by: Rana Snare, 06/05/2019, 1:54 PM

## 2019-06-05 NOTE — Progress Notes (Signed)
[facility] PHYSICAL MEDICINE & REHABILITATION PROGRESS NOTE  Subjective/Complaints: Patient seen sitting up in bed this AM, working with therapies.  He states he slept well overnight. Discussed cognition with therapies.   ROS: Denies CP, SOB, N/V/D.  Objective: Vital Signs: Blood pressure (!) 128/94, pulse 77, temperature 98.4 F (36.9 C), temperature source Oral, resp. rate 17, weight 83.7 kg, SpO2 94 %. ECHOCARDIOGRAM COMPLETE  Result Date: 06/04/2019   ECHOCARDIOGRAM REPORT   Patient Name:   Michael [name]. Date of Exam: 06/04/2019 Medical Rec #:  [id number]          Height:       66.0 in Accession #:    [id number]         Weight:       176.8 lb Date of Birth:  05/28/1955          BSA:          1.90 m Patient Age:    64 years           BP:           142/132 mmHg Patient Gender: M                  HR:           70 bpm. Exam Location:  Inpatient Procedure: 2D Echo, Cardiac Doppler and Color Doppler Indications:    Abnormal EKG, s/p Cardiac arrest  History:        Patient has prior history of Echocardiogram examinations, most                 recent 07/23/2013. Cardiomyopathy, Abnormal ECG,                 Arrythmias:Cardiac Arrest, Atrial Flutter and Vtach; Risk                 Factors:Hypertension and Dyslipidemia. ESRD.  Sonographer:    Dustin Flock Referring Phys: [id number] [place]  1. Left ventricular ejection fraction, by visual estimation, is <20%. The left ventricle has severely decreased function. There is no left ventricular hypertrophy.  2. Severely dilated left ventricular internal cavity size.  3. The left ventricle demonstrates global hypokinesis.  4. Left ventricular diastolic parameters are consistent with Grade II diastolic dysfunction (pseudonormalization).  5. Global right ventricle has normal systolic function.The right ventricular size is normal. No increase in right ventricular wall thickness.  6. Left atrial size was severely dilated.  7. Right atrial size was  severely dilated.  8. The mitral valve is normal in structure. Trivial mitral valve regurgitation. No evidence of mitral stenosis.  9. The tricuspid valve is normal in structure. Tricuspid valve regurgitation is mild. 10. The aortic valve is tricuspid. Aortic valve regurgitation is mild. Moderate aortic valve sclerosis/calcification without any evidence of aortic stenosis. 11. The pulmonic valve was normal in structure. Pulmonic valve regurgitation is not visualized. 12. There is mild dilatation of the aortic root measuring 41 mm. 13. Normal pulmonary artery systolic pressure. 14. The inferior vena cava is dilated in size with >50% respiratory variability, suggesting right atrial pressure of 8 mmHg. 15. The tricuspid regurgitant velocity is 2.04 m/s, and with an assumed right atrial pressure of 8 mmHg, the estimated right ventricular systolic pressure is normal at 24.6 mmHg. 16. Small pericardial effusion. 17. The pericardial effusion is posterior to the left ventricle. FINDINGS  Left Ventricle: Left ventricular ejection fraction, by visual estimation, is <20%. The left ventricle has severely decreased function.  The left ventricle demonstrates global hypokinesis. The left ventricular internal cavity size was severely dilated left ventricle. There is no left ventricular hypertrophy. Left ventricular diastolic parameters are consistent with Grade II diastolic dysfunction (pseudonormalization). Normal left atrial pressure. Right Ventricle: The right ventricular size is normal. No increase in right ventricular wall thickness. Global RV systolic function is has normal systolic function. The tricuspid regurgitant velocity is 2.04 m/s, and with an assumed right atrial pressure  of 8 mmHg, the estimated right ventricular systolic pressure is normal at 24.6 mmHg. Left Atrium: Left atrial size was severely dilated. Right Atrium: Right atrial size was severely dilated Pericardium: A small pericardial effusion is present. The  pericardial effusion is posterior to the left ventricle. Mitral Valve: The mitral valve is normal in structure. Trivial mitral valve regurgitation. No evidence of mitral valve stenosis by observation. Tricuspid Valve: The tricuspid valve is normal in structure. Tricuspid valve regurgitation is mild. Aortic Valve: The aortic valve is tricuspid. Aortic valve regurgitation is mild. Aortic regurgitation PHT measures 650 msec. Moderate aortic valve sclerosis/calcification is present, without any evidence of aortic stenosis. Pulmonic Valve: The pulmonic valve was normal in structure. Pulmonic valve regurgitation is not visualized. Pulmonic regurgitation is not visualized. Aorta: The aortic root, ascending aorta and aortic arch are all structurally normal, with no evidence of dilitation or obstruction and aortic dilatation noted. There is mild dilatation of the aortic root measuring 41 mm. Venous: The inferior vena cava is dilated in size with greater than 50% respiratory variability, suggesting right atrial pressure of 8 mmHg. IAS/Shunts: No atrial level shunt detected by color flow Doppler. There is no evidence of a patent foramen ovale. No ventricular septal defect is seen or detected. There is no evidence of an atrial septal defect.  LEFT VENTRICLE PLAX 2D LVIDd:         6.90 cm  Diastology LVIDs:         6.25 cm  LV e' lateral:   6.98 cm/s LV PW:         1.01 cm  LV E/e' lateral: 12.2 LV IVS:        1.17 cm  LV e' medial:    7.58 cm/s LVOT diam:     2.70 cm  LV E/e' medial:  11.3 LV SV:         50 ml LV SV Index:   25.44 LVOT Area:     5.73 cm  RIGHT VENTRICLE RV Basal diam:  3.20 cm RV S prime:     8.63 cm/s TAPSE (M-mode): 2.0 cm LEFT ATRIUM              Index       RIGHT ATRIUM           Index LA diam:        4.40 cm  2.32 cm/m  RA Area:     25.20 cm LA Vol (A2C):   116.0 ml 61.13 ml/m RA Volume:   82.60 ml  43.53 ml/m LA Vol (A4C):   118.0 ml 62.18 ml/m LA Biplane Vol: 124.0 ml 65.34 ml/m  AORTIC VALVE LVOT  Vmax:   84.90 cm/s LVOT Vmean:  52.500 cm/s LVOT VTI:    0.156 m AI PHT:      650 msec  AORTA Ao Root diam: 4.10 cm MITRAL VALVE                        TRICUSPID VALVE MV Area (PHT): 6.54  cm             TR Peak grad:   16.6 mmHg MV PHT:        33.64 msec           TR Vmax:        214.00 cm/s MV Decel Time: 116 msec MV E velocity: 85.30 cm/s 103 cm/s  SHUNTS MV A velocity: 45.00 cm/s 70.3 cm/s Systemic VTI:  0.16 m MV E/A ratio:  1.90       1.5       Systemic Diam: 2.70 cm  Fransico Him MD Electronically signed by Fransico Him MD Signature Date/Time: 06/04/2019/3:40:46 PM    Final    Recent Labs    06/03/19 0552 06/04/19 0449  WBC 4.1 3.9*  HGB 12.2* 12.9*  HCT 41.2 43.2  PLT 135* 128*   Recent Labs    06/03/19 0552 06/04/19 0449  NA 139 137  K 5.0 4.2  CL 94* 98  CO2 26 26  GLUCOSE 70 91  BUN 51* 35*  CREATININE 11.51* 9.67*  CALCIUM 9.4 8.8*    Physical Exam: BP (!) 128/94   Pulse 77   Temp 98.4 F (36.9 C) (Oral)   Resp 17   Wt 83.7 kg   SpO2 94%   BMI 29.78 kg/m  Constitutional: No distress . Vital signs reviewed. HENT: Normocephalic.  Atraumatic. Eyes: EOMI. No discharge. Cardiovascular: No JVD. Respiratory: Normal effort.  No stridor. GI: Non-distended. Skin: Warm and dry.  Intact. Psych: Normal mood.  Normal behavior. Musc: No edema in extremities.  No tenderness in extremities. Neuro: Alert and oriented to person and place (recently reviewed with therapies) Motor: Grossly 4+/5 throughout  Assessment/Plan: 1. Functional deficits secondary to anoxic BI which require 3+ hours per day of interdisciplinary therapy in a comprehensive inpatient rehab setting.  Physiatrist is providing close team supervision and 24 hour management of active medical problems listed below.  Physiatrist and rehab team continue to assess barriers to discharge/monitor patient progress toward functional and medical goals  Care Tool:  Bathing              Bathing assist        Upper Body Dressing/Undressing Upper body dressing   What is the patient wearing?: Hospital gown only    Upper body assist Assist Level: Supervision/Verbal cueing    Lower Body Dressing/Undressing Lower body dressing      What is the patient wearing?: Hospital gown only     Lower body assist Assist for lower body dressing: Supervision/Verbal cueing     Toileting Toileting    Toileting assist Assist for toileting: Independent with assistive device Assistive Device Comment: urinal   Transfers Chair/bed transfer  Transfers assist     Chair/bed transfer assist level: Supervision/Verbal cueing     Locomotion Ambulation   Ambulation assist              Walk 10 feet activity   Assist           Walk 50 feet activity   Assist           Walk 150 feet activity   Assist           Walk 10 feet on uneven surface  activity   Assist           Wheelchair     Assist               Wheelchair 50 feet with 2  turns activity    Assist            Wheelchair 150 feet activity     Assist            Medical Problem List and Plan: 1.  Impaired mobility and ADLs secondary to anoxic BI.  Begin CIR evaluations  Team conference today to discuss current and goals and coordination of care, home and environmental barriers, and discharge planning with nursing, case manager, and therapies.  2.  Antithrombotics: -DVT/anticoagulation:  Pharmaceutical: Coumadin and Heparin             -antiplatelet therapy: ASA 3. Pain Management: Tylenol prn 4. Mood: LCSW to follow for evaluation and support.              -antipsychotic agents: N/A 5. Neuropsych: This patient is not fully capable of making decisions on his own behalf. Impulsive, requires safety cues.  6. Skin/Wound Care: routine pressure relief measures.  7. Fluids/Electrolytes/Nutrition: Strict I/O. Lytes with HD 8. ESRD: HD MWF at the end of the day to help with tolerance  of therapy during the day.  9. H/o VT/ A flutter/NICM: Low dose BB added.   Monitor with increased mobility. 10. Anemia of chronic disease: Monitor H/H.             Hemoglobin 12.9 on 2/2, labs pending  Cont to monitor  11. Thrombocytopenia  Platelets 128 on 2/2  Labs pending 12. Retinopathy/Glaucoma?: Continue Cosopt bid with pred forte in right eye.  13. Leukopenia  WBCs 3.9 on 2/2, labs pending  LOS: 1 days A FACE TO FACE EVALUATION WAS PERFORMED  Zi Sek Lorie Phenix 06/05/2019, 8:46 AM

## 2019-06-05 NOTE — Progress Notes (Signed)
Subjective:  Seen In Rehab Room  , for HD today, No cos ,states "im getting a CPAP machine to use here, mine at home was old couldn't adjust to it"   Objective Vital signs in last 24 hours: Vitals:   06/04/19 1633 06/04/19 1914 06/05/19 0500 06/05/19 0515  BP: 136/82 (!) 118/95  (!) 128/94  Pulse: 77 76  77  Resp: 20 19  17   Temp: 98 F (36.7 C) 98 F (36.7 C)  98.4 F (36.9 C)  TempSrc:    Oral  SpO2: 98% 96%  94%  Weight:   83.7 kg    Weight change:   Physical Exam: General: siting on side of bed  NAD , OX3 Heart: occas. Irreg ,  With   Pulse in 70s', no  Rub  this am  Lungs: un labored  breathing , CTA  now Abdomen: BS pos , soft , nt, nd Extremities: Bilat. Lower extrem pedal edema 1 +   Dialysis Access: R IJ  Perm Cath    Dialysis Orders: [facility] MWF 4.5h 400/800 79kg 2/2 bath TDC Hep 4000 - no esa active - last Hb 13.9 on 1/22 - parsabiv 2.5 mg 3 x/week - hectorol 2 mcg IV each tx  Problem/Plan: 1.  Cardiac Arrest - SP Cooling Protocol= / card / EP following   2. ESRD - HD  mwf , use TDC  For now , hd today  3. Acute Hypoxic Resp. Failure sp Vent = resolved  now on RA  And CIR 4. Nonischemic CM  EF  2 d echo yest "<20%" EP and Card following 5. HTN/volume -  bp stable, on Metoprol 12.5 mg bid  / CXR 1/27 - vasc congestion last hd  Post wt 77.6 below edw  Need stand wts  6. Anemia - HGB 12.9  No esa needs  7. Secondary hyperparathyroidism -phos 10.4 binders  Ca corec 9.9  / Renvela/   as binders  Reordered  yest /on Hec  2 mcg 8. Nutrition= Nepro / renal diet/  Renal vit  9. OSA   Ernest Haber, PA-C [facility] Beeper [phone] 06/05/2019,12:34 PM  LOS: 1 day   Labs: Basic Metabolic Panel: Recent Labs  Lab 05/30/19 0445 05/30/19 1654 06/02/19 0450 06/02/19 0450 06/02/19 2006 06/03/19 0552 06/04/19 0449  NA 139   < > 137   < > 137 139 137  K 4.5   < > 4.6   < > 4.9 5.0 4.2  CL 100   < > 96*   < > 94* 94* 98  CO2 25   < > 26   < > 25  26 26   GLUCOSE 64*   < > 90   < > 90 70 91  BUN 28*   < > 44*   < > 47* 51* 35*  CREATININE 7.54*   < > 9.71*   < > 10.59* 11.51* 9.67*  CALCIUM 8.8*   < > 9.1   < > 9.1 9.4 8.8*  PHOS 5.8*  --  10.4*  --   --   --   --    < > = values in this interval not displayed.   Liver Function Tests: Recent Labs  Lab 05/29/19 1628 06/01/19 0418 06/02/19 0450  AST 22 38  --   ALT 11 6  --   ALKPHOS 34* 50  --   BILITOT 0.9 1.5*  --   PROT 5.8* 6.0*  --   ALBUMIN 3.1*  2.8* 2.6*   No results for input(s): LIPASE, AMYLASE in the last 168 hours. No results for input(s): AMMONIA in the last 168 hours. CBC: Recent Labs  Lab 05/29/19 1628 05/29/19 1810 05/30/19 0445 05/30/19 0445 05/31/19 0639 06/01/19 0338 06/01/19 0500 06/03/19 0552 06/04/19 0449  WBC 6.9   < > 4.6   < > 4.8  --  6.1 4.1 3.9*  NEUTROABS 5.8  --   --   --   --   --   --   --   --   HGB 14.4   < > 14.2   < > 13.5   < > 14.5 12.2* 12.9*  HCT 48.9   < > 46.0   < > 42.9   < > 47.0 41.2 43.2  MCV 86.4   < > 82.7  --  80.8  --  81.9 85.8 85.4  PLT 179   < > 163   < > 150  --  170 135* 128*   < > = values in this interval not displayed.   Cardiac Enzymes: No results for input(s): CKTOTAL, CKMB, CKMBINDEX, TROPONINI in the last 168 hours. CBG: Recent Labs  Lab 06/01/19 0416 06/01/19 0657 06/01/19 0809 06/01/19 1150 06/01/19 1533  GLUCAP 73 79 81 85 92    Medications:  . acyclovir  400 mg Oral Q lunch  . aspirin EC  81 mg Oral Daily  . atorvastatin  40 mg Oral QHS  . Chlorhexidine Gluconate Cloth  6 each Topical Q0600  . dorzolamide-timolol  1 drop Both Eyes BID  . doxercalciferol  2 mcg Intravenous Q M,W,F-HD  . feeding supplement (NEPRO CARB STEADY)  237 mL Oral BID BM  . heparin  5,000 Units Subcutaneous Q8H  . metoprolol succinate  12.5 mg Oral Daily  . multivitamin  1 tablet Oral QHS  . pantoprazole  40 mg Oral Daily  . prednisoLONE acetate  1 drop Right Eye Q1200  . sevelamer carbonate  1,600 mg Oral  TID WC

## 2019-06-05 NOTE — Evaluation (Signed)
Physical Therapy Assessment and Plan  Patient Details  Name: Michael [name]. [name]: [id number] Date of Birth: Nov 14, 1955  PT Diagnosis: Abnormality of gait, Ataxic gait, Difficulty walking, Impaired cognition and Osteoarthritis Rehab Potential: Excellent ELOS: 5-7 days   Today's Date: 06/05/2019 PT Individual Time: 1000-1116 PT Individual Time Calculation (min): 76 min    Problem List:  Patient Active Problem List   Diagnosis Date Noted  . Leukopenia   . Thrombocytopenia ([place])   . Anemia of chronic disease   . History of ventricular tachycardia   . Anoxic brain injury ([place]) 06/04/2019  . Status post creation of arteriovenous fistula 05/29/2019  . Cardiac arrest ([place]) 05/29/2019  . Unspecified atrial flutter ([place]) 05/21/2019  . Atrial tachycardia ([place]) 02/06/2019  . Pseudoaneurysm of AV hemodialysis fistula, initial encounter ([place]) 04/14/2017  . Status post right inguinal hernia repair-July 2017 11/06/2015  . ESRD on dialysis ([place]) 09/15/2015  . Hypersomnia with sleep apnea 09/15/2015  . Bradycardia, drug induced 09/15/2015  . OSA on CPAP 03/24/2014  . Hypoxemia 12/12/2013  . Hypersomnia with sleep apnea, unspecified 12/12/2013  . PVC's // RBBB sup Axis 05/15/2013  . Nonischemic cardiomyopathy ([place])   . VT (ventricular tachycardia) ([place]) 05/13/2013    Past Medical History:  Past Medical History:  Diagnosis Date  . Atrial arrhythmia    atrial tachycardia with variable AV conduction versus atypical aflutter 01/10/19, rate control (02/06/19)  . Cataract   . Dyspnea    on exertion  . ESRD (end stage renal disease) ([place])    Hemo- MWF, Polycystic kidney disease  . Fatigue   . History of kidney stones    removal of stone- cysto  . Hyperlipidemia   . Hyperparathyroidism, secondary renal ([place])   . Hypertension   . Hypoxemia 12/12/2013  . Nonischemic cardiomyopathy ([place])    Er 25% 2015, 55 % 2013  . OSA on CPAP    no longer using cpap  . OSA on CPAP 03/24/2014  .  Pneumonia    2015ish  . Ventricular tachycardia//Freq PVCs   . Wears glasses    Past Surgical History:  Past Surgical History:  Procedure Laterality Date  . A-FLUTTER ABLATION N/A 04/11/2019   Procedure: A-FLUTTER ABLATION;  Surgeon: Evans Lance, MD;  Location: [place] [facility];  Service: Cardiovascular;  Laterality: N/A;  . A/V FISTULAGRAM Left 04/27/2017   Procedure: A/V FISTULAGRAM;  Surgeon: Conrad Pineland, MD;  Location: [place] [facility];  Service: Cardiovascular;  Laterality: Left;  lt arm  . A/V FISTULAGRAM Left 01/10/2019   Procedure: A/V FISTULAGRAM;  Surgeon: Marty Heck, MD;  Location: [place] [facility];  Service: Cardiovascular;  Laterality: Left;  . APPENDECTOMY    . AV FISTULA PLACEMENT  12/05/2011   Procedure: ARTERIOVENOUS (AV) FISTULA CREATION;LLEFT ARM  Surgeon: Conrad Arpin, MD;  Location: [place];  Service: Vascular;  Laterality: Left;  RADIO-CEPHALIC  fistula left arm  . AV FISTULA PLACEMENT  01/11/2012   Procedure: ARTERIOVENOUS (AV) FISTULA CREATION;  Surgeon: Conrad Chittenango, MD;  Location: [place];  Service: Vascular;  Laterality: Left;  Creation of left brachial cephalic arteriovenous fistula  . AV FISTULA PLACEMENT Right 03/07/2019   Procedure: ARTERIOVENOUS (AV) FISTULA CREATION  RIGHT ARM;  Surgeon: Marty Heck, MD;  Location: [place];  Service: Vascular;  Laterality: Right;  . BASCILIC VEIN TRANSPOSITION Left 12/27/2016   Procedure: BASILIC VEIN TRANSPOSITION LEFT UPPER ARM FIRST STAGE;  Surgeon: Conrad Shorter, MD;  Location: [place];  Service: Vascular;  Laterality: Left;  . BASCILIC VEIN TRANSPOSITION Left 01/31/2017   Procedure: LEFT ARM BASILIC VEIN TRANSPOSITION, SECOND STAGE;  Surgeon: Conrad Gilman, MD;  Location: [place];  Service: Vascular;  Laterality: Left;  . CARDIAC CATHETERIZATION  04-05-2010   checking for blockage but none-[facility]  . COLONOSCOPY    . CYSTOSCOPY W/ STONE MANIPULATION     "laser once" (01/22/2013)  . HEMATOMA EVACUATION Left  05/09/2017   Procedure: EVACUATION HEMATOMA LEFT ARM;  Surgeon: Conrad [place], MD;  Location: [employer];  Service: Vascular;  Laterality: Left;  . HERNIA REPAIR    . INGUINAL HERNIA REPAIR Right 11/06/2015   Procedure: OPEN HERNIA REPAIR  RIGHT INGUINAL ADULT;  Surgeon: [name], MD;  Location: [facility];  Service: General;  Laterality: Right;  with MESH  . INSERTION OF DIALYSIS CATHETER Right 10/05/2016   Procedure: INSERTION OF right internal jugular DIALYSIS CATHETER;  Surgeon: [name], MD;  Location: [place];  Service: Vascular;  Laterality: Right;  . INSERTION OF MESH  03/20/2012   Procedure: INSERTION OF MESH;  UMB Surgeon: [name], MD;  Location: [place];  Service: General;  Laterality: N/A;  . INSERTION OF MESH N/A 01/22/2013   Procedure: INSERTION OF MESH;  Surgeon: [name], MD;  Location: [place];  Service: General;  Laterality: N/A;  . LAPAROTOMY  04/02/2012   Procedure: EXPLORATORY LAPAROTOMY;  Surgeon: [name], MD;  Location: [place];  Service: General;  Laterality: N/A;  Exploratory Laparotomy with resection of small intestine  . LEFT HEART CATHETERIZATION WITH CORONARY ANGIOGRAM N/A 05/14/2013   Procedure: LEFT HEART CATHETERIZATION WITH CORONARY ANGIOGRAM;  Surgeon: Sinclair Grooms, MD;  Location: [facility];  Service: Cardiovascular;  Laterality: N/A;  . LIGATION OF ARTERIOVENOUS  FISTULA Left 12/27/2016   Procedure: LIGATION/EXCISION OF LEFT UPPER ARM ARTERIOVENOUS  FISTULA;  EVACUATION OF HEMATOMA;  Surgeon: Conrad Marcus, MD;  Location: [place];  Service: Vascular;  Laterality: Left;  . LIGATION OF ARTERIOVENOUS  FISTULA Left 03/07/2019   Procedure: LIGATION OF ARTERIOVENOUS FISTULA  LEFT UPPER ARM;  Surgeon: Marty Heck, MD;  Location: [place];  Service: Vascular;  Laterality: Left;  . REVISON OF ARTERIOVENOUS FISTULA Left 10/05/2016   Procedure: REVISON OF left arm ARTERIOVENOUS FISTULA;  Surgeon: [name], MD;  Location: [place];  Service: Vascular;   Laterality: Left;  . TONSILLECTOMY    . UMBILICAL HERNIA REPAIR  03/20/2012   Procedure: HERNIA REPAIR UMBILICAL ADULT;  Surgeon: [name], MD;  Location: [place];  Service: General;  Laterality: N/A;  . UMBILICAL HERNIA REPAIR  01/22/2013   preperitoneal open procedure due to significant adhesions/notes 01/22/2013  . VENTRAL HERNIA REPAIR N/A 01/22/2013   Procedure: ATTEMPTED LAPAROSCOPIC VENTRAL HERNIA CONVERTED TO OPEN;  Surgeon: [name], MD;  Location: [place];  Service: General;  Laterality: N/A;    Assessment & Plan Clinical Impression: Michael [name] is a 64 year old male with history of OSA, retinopathy - getting injections in his eyes, NICM, VT/A flutter s/p ablation 04/2019, ESRD with HD MWF who was admitted on 05/29/19 for AV fistula v/s graft placement due to problems with piror access. Prior to initiation of procedure, patient sustained cardiac arrest requiring CPR X 4 mintues with ACLS protocol prior to ROSC.  Arrest felt to be respiratory in nature due to sedatives and rapid decline in compromised RV function as bedside echo reported to showglobal hypokinesis with EF 20%.  [name] He  underwent HD to optimize volume status.  He had issues with agitation requiring sedation. EEG showed diffuse encephalopathy. CT head showed generalized volume loss without acute changes. He tolerated extubation on 01/30 but continued to have cognitive deficits with slurred speech.  MRI brain showed mild brain atrophy and no signs of anoxic injury. He has had issues with agitation as well as cognitive deficits with delayed responses, poor safety awareness, fatigue and unsteady gait affecting mobility and ADLs. CIR recommended due to anoxic BI.   Patient transferred to CIR on 06/04/2019 .   Patient currently requires min with mobility secondary to muscle weakness, decreased coordination and decreased standing balance.  Prior to hospitalization, patient was independent  with mobility and lived with  Spouse in a House home.  Home access is 4-6 steps from various entrances.Stairs to enter.  Patient will benefit from skilled PT intervention to maximize safe functional mobility and minimize fall risk for planned discharge home with 24 hour supervision.  Anticipate patient will benefit from follow up [facility] at discharge.  PT - End of Session Activity Tolerance: Tolerates 30+ min activity without fatigue Endurance Deficit: No PT Assessment Rehab Potential (ACUTE/IP ONLY): Excellent PT Barriers to Discharge: Other (comments) PT Barriers to Discharge Comments: memory, safety issues PT Patient demonstrates impairments in the following area(s): Balance;Safety PT Transfers Functional Problem(s): Bed to Chair;Car;Furniture;Floor PT Locomotion Functional Problem(s): Stairs PT Plan PT Intensity: Minimum of 1-2 x/day ,45 to 90 minutes PT Frequency: 5 out of 7 days PT Duration Estimated Length of Stay: 5-7 days PT Treatment/Interventions: Ambulation/gait training;Discharge planning;Therapeutic Activities;Balance/vestibular training;Neuromuscular re-education;Therapeutic Exercise;DME/adaptive equipment instruction;UE/LE Strength taining/ROM;Community reintegration;Patient/family education;Stair training;UE/LE Coordination activities PT Transfers Anticipated Outcome(s): Independent PT Locomotion Anticipated Outcome(s): Supervision w/ LRAD. PT Recommendation Follow Up Recommendations: Home health PT Patient destination: Home Equipment Recommended: Rolling walker with 5" wheels;Cane Equipment Details: AD to be determined based on progress and safety.  Skilled Therapeutic Intervention Evaluation completed (see details above and below) with education on PT POC and goals and individual treatment initiated with focus on balance, gait and safety.  Pt presents sitting at EOB and agreeable to participate in therapy.  Cushion put in w/c.  Pt performed sup <> sit transfers w/ supervision.  Pt transferred multiple  trials w/ CGA as well as SPT w/ and w/o AD.  Pt requires verbal cues for hand placement for safety.  Pt amb multiple trials w/ RW up to 41' w/ CGA w/ noted decreased BOS and meandering gait as fatigues.  Pt unaware of deficits.  Pt amb short distances w/o AD and CGA.  Noted LOB especially w/ head turns.  Pt transferred in/out of car at 2 different heights ([place] and [place]) w/ CGA and verbal cues for hand placement for safety.  Pt negotiated 4 steps w/ bilateral hand rails reciprocally w/ CGA.  Pt performed standing balance activities to challenge balance, including standing overhead press w/ T-ball, rotations w/ ball, toe-taps, obstacle course w/o AD, picking up cones from floor w/ CGA.  Pt also able to negotiate w/c approx. 18' multiple trials w/ bilat UEs and encouraged to use LEs.  Pt left sitting at EOB w/ all needs in reach and bed alarm on.  Spoke w/ pt about safety and not moving about as he wants after discussion w/ nurse about same.  Pt appears to understand safety for ability to return home.  Nurse aware of position of pt and education given to pt for safety  PT Evaluation Precautions/Restrictions Precautions Precautions: Fall;Other (comment) Precaution Comments: recent  CPR, but has no c/o pain. Restrictions Weight Bearing Restrictions: No General   Vital Signs Pain   Home Living/Prior Functioning Home Living Living Arrangements: Spouse/significant other;Children Available Help at Discharge: Family;Available 24 hours/day Type of Home: House Home Access: Stairs to enter [employer] of Steps: 4-6 steps from various entrances. Entrance Stairs-Rails: Can reach both;Left;Right Home Layout: One level Bathroom Shower/Tub: Chiropodist: Handicapped height Bathroom Accessibility: Yes  Lives With: Spouse Prior Function Level of Independence: Independent with basic ADLs;Independent with gait;Other (comment)(driving and yard work performed per pt report.)  Able  to Take Stairs?: Reciprically Driving: Yes Vocation: On disability Leisure: Hobbies-yes (Comment) Comments: retired, former Customer service manager, Health and safety inspector Overall Cognitive Status: Impaired/Different from baseline Arousal/Alertness: Awake/alert Orientation Level: Oriented to person;Oriented to place;Oriented to time Attention: Sustained Sustained Attention: Impaired Sustained Attention Impairment: Verbal basic Memory: Impaired Memory Impairment: Decreased short term memory;Decreased recall of new information Decreased Short Term Memory: Verbal basic;Functional basic Awareness: Impaired Awareness Impairment: Intellectual impairment Problem Solving: Impaired Problem Solving Impairment: Verbal basic Executive Function: (all areas impacted by lower level deficits) Safety/Judgment: Impaired Comments: minimizes deficits Sensation Sensation Light Touch: Appears Intact Coordination Gross Motor Movements are Fluid and Coordinated: No Coordination and Movement Description: decreased BOS w/ gait, meandering gait as fatigues. Motor  Motor Motor: Within Functional Limits  Mobility Bed Mobility Bed Mobility: Sit to Supine;Supine to Sit Supine to Sit: Supervision/Verbal cueing Sit to Supine: Supervision/Verbal cueing Transfers Transfers: Sit to Stand;Stand Pivot Transfers Sit to Stand: Contact Guard/Touching assist Stand Pivot Transfers: Contact Guard/Touching assist Transfer (Assistive device): Rolling walker(w/ and w/o RW.) Locomotion  Gait Ambulation: Yes Gait Assistance: Contact Guard/Touching assist Gait Distance (Feet): 75 Feet Assistive device: Rolling walker Gait Gait: Yes Gait Pattern: Step-through pattern;Narrow base of support Gait velocity: dec Stairs / Additional Locomotion Stairs: Yes Stairs Assistance: Contact Guard/Touching assist Stair Management Technique: Two rails Number of Stairs: 4 Height of Stairs: 6 Curb: Advertising account planner Mobility: Yes Wheelchair Assistance: Development worker, international aid: Both upper extremities(encouraged to "walk" with bilateral LEs.) Wheelchair Parts Management: Needs assistance Distance: 40  Trunk/Postural Assessment  Cervical Assessment Cervical Assessment: Within Functional Limits Thoracic Assessment Thoracic Assessment: Within Functional Limits Lumbar Assessment Lumbar Assessment: Within Functional Limits Postural Control Postural Control: Deficits on evaluation Righting Reactions: list to side w/ head turn.  Balance Balance Balance Assessed: No Extremity Assessment      RLE Assessment RLE Assessment: Within Functional Limits General Strength Comments: grossly at least 4/5 LLE Assessment LLE Assessment: Within Functional Limits General Strength Comments: grossly at least 4/5.    Refer to Care Plan for Long Term Goals  Recommendations for other services: None   Discharge Criteria: Patient will be discharged from PT if patient refuses treatment 3 consecutive times without medical reason, if treatment goals not met, if there is a change in medical status, if patient makes no progress towards goals or if patient is discharged from hospital.  The above assessment, treatment plan, treatment alternatives and goals were discussed and mutually agreed upon: by patient  Ladoris Gene 06/05/2019, 12:14 PM

## 2019-06-05 NOTE — Patient Care Conference (Signed)
Inpatient RehabilitationTeam Conference and Plan of Care Update Date: 06/05/2019   Time: 11:50 AM   Patient Name: Michael [name].      Medical Record Number: [id number]  Date of Birth: 22-Jan-1956 Sex: Male         Room/Bed: 4M10C/4M10C-01 Payor Info: Payor: MEDICARE / Plan: MEDICARE PART A AND B / Product Type: *No Product type* /    Admit Date/Time:  06/04/2019  3:58 PM  Primary Diagnosis:  Anoxic brain injury [facility])  Patient Active Problem List   Diagnosis Date Noted  . Leukopenia   . Thrombocytopenia ([place])   . Anemia of chronic disease   . History of ventricular tachycardia   . Anoxic brain injury ([place]) 06/04/2019  . Status post creation of arteriovenous fistula 05/29/2019  . Cardiac arrest ([place]) 05/29/2019  . Unspecified atrial flutter ([place]) 05/21/2019  . Atrial tachycardia ([place]) 02/06/2019  . Pseudoaneurysm of AV hemodialysis fistula, initial encounter ([place]) 04/14/2017  . Status post right inguinal hernia repair-July 2017 11/06/2015  . ESRD on dialysis ([place]) 09/15/2015  . Hypersomnia with sleep apnea 09/15/2015  . Bradycardia, drug induced 09/15/2015  . OSA on CPAP 03/24/2014  . Hypoxemia 12/12/2013  . Hypersomnia with sleep apnea, unspecified 12/12/2013  . PVC's // RBBB sup Axis 05/15/2013  . Nonischemic cardiomyopathy ([place])   . VT (ventricular tachycardia) ([place]) 05/13/2013    Expected Discharge Date: Expected Discharge Date: 06/12/19  Team Members Present: Physician leading conference: Dr. Delice Lesch Social Worker Present: Lennart Pall, LCSW(Auria Barbra Sarks, LCSW) Nurse Present: Dorien Chihuahua, RN;Hilary Renee Rival, RN Case manager: Karene Fry, RN PT Present: Becky Sax, PT OT Present: Simonne Come, OT SLP Present: Stormy Fabian, SLP PPS Coordinator present : Ileana Ladd, PT     Current Status/Progress Goal Weekly Team Focus  Bowel/Bladder   continent of bowel/anuric d/t dyalysis  maintaing continence  q shift and PRN toileting needs   Swallow/Nutrition/  Hydration             ADL's   CGA bathing, dressing, transfers with RW  Supervision (due to cognitive impairments/awareness)  ADL retraining, activity tolerance/endurance, safety awareness   Mobility   bed mobility supervision, transfers CGA with RW, gait 3ft with RW CGA  Supervision with LRAD for ambulation, independent for transfers  functional mobility/transfers, ambulation, balance/coordination, LE strength, improved endurance   Communication             Safety/Cognition/ Behavioral Observations  Max A for recall, sustained attention, safety awareness  Mod A  completion of cognitive linguistic evaluation   Pain   no c/o pain  remain pain free  assess pain level qshift and prn   Skin   skin tear mid-back  maintain skin integrity  assess skin qshift and prn    Rehab Goals Patient on target to meet rehab goals: Yes *See Care Plan and progress notes for long and short-term goals.     Barriers to Discharge  Current Status/Progress Possible Resolutions Date Resolved   Nursing                  PT  Other (comments)  decreased safety awareness, memory deficits              OT Decreased caregiver support  wife to take [place]  Discharge Planning/Teaching Needs:  Plan for pt to discharge to home with 24/7 care from his son. Patient wife works 4:30am-3:30pm and intends to use FMLA at discharge.  Family education per therapy recommendations   Team Discussion: Anoxic BI, beta blocker started, monitoring platelets and WBC.  RN refusing bed alarm, getting up on his own.  OT CGA walker, bathing/dressing CGA, S shower, min A LB dressing, S goals.  PT eval pending.  SLP cog deficits, decreased awareness, mod a goals.  Wife works but can take [name], son at home with autism, will need 24/7 care at DC.   Revisions to Treatment Plan: N/A     Medical Summary Current Status: Impaired mobility and ADLs secondary to anoxic BI Weekly Focus/Goal:  Improve mobility, platelets, leucopenia, cognition, follow HR  Barriers to Discharge: Medical stability;Hemodialysis   Possible Resolutions to Barriers: Therapies, follow labs, monitor HR   Continued Need for Acute Rehabilitation Level of Care: The patient requires daily medical management by a physician with specialized training in physical medicine and rehabilitation for the following reasons: Direction of a multidisciplinary physical rehabilitation program to maximize functional independence : Yes Medical management of patient stability for increased activity during participation in an intensive rehabilitation regime.: Yes Analysis of laboratory values and/or radiology reports with any subsequent need for medication adjustment and/or medical intervention. : Yes   I attest that I was present, lead the team conference, and concur with the assessment and plan of the team.   Jodell Cipro M 06/05/2019, 10:03 PM   Team conference was held via web/ teleconference due to [place] - 19

## 2019-06-05 NOTE — Evaluation (Signed)
Occupational Therapy Assessment and Plan  Patient Details  Name: Michael [name]. [name]: [id number] Date of Birth: August 10, 1955  OT Diagnosis: cognitive deficits and muscle weakness (generalized) Rehab Potential: Rehab Potential (ACUTE ONLY): Good ELOS: 5-7 days   Today's Date: 06/05/2019 OT Individual Time: 3254-9826 OT Individual Time Calculation (min): 60 min     Problem List:  Patient Active Problem List   Diagnosis Date Noted  . Leukopenia   . Thrombocytopenia ([place])   . Anemia of chronic disease   . History of ventricular tachycardia   . Anoxic brain injury ([place]) 06/04/2019  . Status post creation of arteriovenous fistula 05/29/2019  . Cardiac arrest ([place]) 05/29/2019  . Unspecified atrial flutter ([place] ) 05/21/2019  . Atrial tachycardia ([place]) 02/06/2019  . Pseudoaneurysm of AV hemodialysis fistula, initial encounter ([place]) 04/14/2017  . Status post right inguinal hernia repair-July 2017 11/06/2015  . ESRD on dialysis ([place]) 09/15/2015  . Hypersomnia with sleep apnea 09/15/2015  . Bradycardia, drug induced 09/15/2015  . OSA on CPAP 03/24/2014  . Hypoxemia 12/12/2013  . Hypersomnia with sleep apnea, unspecified 12/12/2013  . PVC's // RBBB sup Axis 05/15/2013  . Nonischemic cardiomyopathy ([place])   . VT (ventricular tachycardia) ([place]) 05/13/2013    Past Medical History:  Past Medical History:  Diagnosis Date  . Atrial arrhythmia    atrial tachycardia with variable AV conduction versus atypical aflutter 01/10/19, rate control (02/06/19)  . Cataract   . Dyspnea    on exertion  . ESRD (end stage renal disease) ([place])    Hemo- MWF, Polycystic kidney disease  . Fatigue   . History of kidney stones    removal of stone- cysto  . Hyperlipidemia   . Hyperparathyroidism, secondary renal ([place])   . Hypertension   . Hypoxemia 12/12/2013  . Nonischemic cardiomyopathy ([place])    Er 25% 2015, 55 % 2013  . OSA on CPAP    no longer using cpap  . OSA on CPAP 03/24/2014  . Pneumonia     2015ish  . Ventricular tachycardia//Freq PVCs   . Wears glasses    Past Surgical History:  Past Surgical History:  Procedure Laterality Date  . A-FLUTTER ABLATION N/A 04/11/2019   Procedure: A-FLUTTER ABLATION;  Surgeon: Evans Lance, MD;  Location: [place] [facility];  Service: Cardiovascular;  Laterality: N/A;  . A/V FISTULAGRAM Left 04/27/2017   Procedure: A/V FISTULAGRAM;  Surgeon: Conrad Taft Mosswood, MD;  Location: [place] [facility];  Service: Cardiovascular;  Laterality: Left;  lt arm  . A/V FISTULAGRAM Left 01/10/2019   Procedure: A/V FISTULAGRAM;  Surgeon: Marty Heck, MD;  Location: [place] [facility];  Service: Cardiovascular;  Laterality: Left;  . APPENDECTOMY    . AV FISTULA PLACEMENT  12/05/2011   Procedure: ARTERIOVENOUS (AV) FISTULA CREATION;LLEFT ARM  Surgeon: Conrad Stafford Courthouse, MD;  Location: [place];  Service: Vascular;  Laterality: Left;  RADIO-CEPHALIC  fistula left arm  . AV FISTULA PLACEMENT  01/11/2012   Procedure: ARTERIOVENOUS (AV) FISTULA CREATION;  Surgeon: Conrad [place]ia, MD;  Location: [place];  Service: Vascular;  Laterality: Left;  Creation of left brachial cephalic arteriovenous fistula  . AV FISTULA PLACEMENT Right 03/07/2019   Procedure: ARTERIOVENOUS (AV) FISTULA CREATION  RIGHT ARM;  Surgeon: Marty Heck, MD;  Location: [place];  Service: Vascular;  Laterality: Right;  . BASCILIC VEIN TRANSPOSITION Left 12/27/2016   Procedure: BASILIC VEIN TRANSPOSITION LEFT UPPER ARM FIRST STAGE;  Surgeon: Conrad Shrewsbury, MD;  Location: [facility];  Service: Vascular;  Laterality: Left;  . BASCILIC VEIN TRANSPOSITION Left 01/31/2017   Procedure: LEFT ARM BASILIC VEIN TRANSPOSITION, SECOND STAGE;  Surgeon: Conrad Laurence [place], MD;  Location: [place];  Service: Vascular;  Laterality: Left;  . CARDIAC CATHETERIZATION  04-05-2010   checking for blockage but none-[facility]  . COLONOSCOPY    . CYSTOSCOPY W/ STONE MANIPULATION     "laser once" (01/22/2013)  . HEMATOMA EVACUATION Left 05/09/2017    Procedure: EVACUATION HEMATOMA LEFT ARM;  Surgeon: Conrad [place], MD;  Location: [place];  Service: Vascular;  Laterality: Left;  . HERNIA REPAIR    . INGUINAL HERNIA REPAIR Right 11/06/2015   Procedure: OPEN HERNIA REPAIR  RIGHT INGUINAL ADULT;  Surgeon: [name], MD;  Location: [facility];  Service: General;  Laterality: Right;  with MESH  . INSERTION OF DIALYSIS CATHETER Right 10/05/2016   Procedure: INSERTION OF right internal jugular DIALYSIS CATHETER;  Surgeon: [name], MD;  Location: [place];  Service: Vascular;  Laterality: Right;  . INSERTION OF MESH  03/20/2012   Procedure: INSERTION OF MESH;  UMB Surgeon: [name], MD;  Location: [place];  Service: General;  Laterality: N/A;  . INSERTION OF MESH N/A 01/22/2013   Procedure: INSERTION OF MESH;  Surgeon: [name], MD;  Location: [place];  Service: General;  Laterality: N/A;  . LAPAROTOMY  04/02/2012   Procedure: EXPLORATORY LAPAROTOMY;  Surgeon: [name], MD;  Location: [place];  Service: General;  Laterality: N/A;  Exploratory Laparotomy with resection of small intestine  . LEFT HEART CATHETERIZATION WITH CORONARY ANGIOGRAM N/A 05/14/2013   Procedure: LEFT HEART CATHETERIZATION WITH CORONARY ANGIOGRAM;  Surgeon: Sinclair Grooms, MD;  [facility];  Service: Cardiovascular;  Laterality: N/A;  . LIGATION OF ARTERIOVENOUS  FISTULA Left 12/27/2016   Procedure: LIGATION/EXCISION OF LEFT UPPER ARM ARTERIOVENOUS  FISTULA;  EVACUATION OF HEMATOMA;  Surgeon: Conrad Lyndon, MD;  Location: [place];  Service: Vascular;  Laterality: Left;  . LIGATION OF ARTERIOVENOUS  FISTULA Left 03/07/2019   Procedure: LIGATION OF ARTERIOVENOUS FISTULA  LEFT UPPER ARM;  Surgeon: Marty Heck, MD;  Location: [place];  Service: Vascular;  Laterality: Left;  . REVISON OF ARTERIOVENOUS FISTULA Left 10/05/2016   Procedure: REVISON OF left arm ARTERIOVENOUS FISTULA;  Surgeon: [name], MD;  Location: [place];  Service: Vascular;   Laterality: Left;  . TONSILLECTOMY    . UMBILICAL HERNIA REPAIR  03/20/2012   Procedure: HERNIA REPAIR UMBILICAL ADULT;  Surgeon: [name], MD;  Location: [place];  Service: General;  Laterality: N/A;  . UMBILICAL HERNIA REPAIR  01/22/2013   preperitoneal open procedure due to significant adhesions/notes 01/22/2013  . VENTRAL HERNIA REPAIR N/A 01/22/2013   Procedure: ATTEMPTED LAPAROSCOPIC VENTRAL HERNIA CONVERTED TO OPEN;  Surgeon: [name], MD;  Location: [place];  Service: General;  Laterality: N/A;    Assessment & Plan Clinical Impression: Patient is a 64 y.o. year old male with history of OSA, retinopathy - getting injections in his eyes, NICM, VT/A flutter s/p ablation 04/2019, ESRD with HD MWF who was admitted on 05/29/19 for AV fistula v/s graft placement due to problems with piror access. Prior to initiation of procedure, patient sustained cardiac arrest requiring CPR X 4 mintues with ACLS protocol prior to ROSC.  Arrest felt to be respiratory in nature due to sedatives and rapid decline in compromised RV function as bedside echo reported to showglobal hypokinesis with EF 20%.  . He underwent  HD to optimize volume status.  He had issues with agitation requiring sedation. EEG showed diffuse encephalopathy. CT head showed generalized volume loss without acute changes. He tolerated extubation on 01/30 but continued to have cognitive deficits with slurred speech.  MRI brain showed mild brain atrophy and no signs of anoxic injury. He has had issues with agitation as well as cognitive deficits with delayed responses, poor safety awareness, fatigue and unsteady gait affecting mobility and ADLs. CIR recommended due to anoxic BI.  Patient transferred to CIR on 06/04/2019 .    Patient currently requires min with basic self-care skills secondary to muscle weakness, decreased attention, decreased awareness, decreased problem solving, decreased safety awareness and decreased memory and decreased  standing balance and decreased balance strategies.  Prior to hospitalization, patient could complete ADLs with independent .  Patient will benefit from skilled intervention to decrease level of assist with basic self-care skills prior to discharge home with care partner.  Anticipate patient will require 24 hour supervision for cognition and follow up home health vs none.  OT - End of Session Activity Tolerance: Endurance does not limit participation in activity Endurance Deficit: No OT Assessment Rehab Potential (ACUTE ONLY): Good OT Barriers to Discharge: Decreased caregiver support OT Barriers to Discharge Comments: wife to take FMLA OT Patient demonstrates impairments in the following area(s): Balance;Cognition;Endurance;Pain;Safety OT Basic ADL's Functional Problem(s): Grooming;Bathing;Dressing;Toileting OT Transfers Functional Problem(s): Toilet;Tub/Shower OT Additional Impairment(s): None OT Plan OT Intensity: Minimum of 1-2 x/day, 45 to 90 minutes OT Frequency: 5 out of 7 days OT Duration/Estimated Length of Stay: 5-7 days OT Treatment/Interventions: Balance/vestibular training;Cognitive remediation/compensation;Community reintegration;Discharge planning;Disease mangement/prevention;Functional mobility training;Pain management;Patient/family education;Psychosocial support;Self Care/advanced ADL retraining;Therapeutic Activities;Therapeutic Exercise OT Basic Self-Care Anticipated Outcome(s): Supervision OT Toileting Anticipated Outcome(s): Supervision OT Bathroom Transfers Anticipated Outcome(s): Supervision OT Recommendation Patient destination: Home Follow Up Recommendations: Home health OT;24 hour supervision/assistance Equipment Recommended: Tub/shower seat   Skilled Therapeutic Intervention OT eval completed with discussion of rehab process, OT purpose, POC, ELOS, and goals.  ADL assessment completed with focus on functional mobility, dynamic standing balance, safety  awareness, and awareness of deficits.  Bathing and dressing completed at shower level.  Pt ambulated to room shower with RW with CGA.  Completed bathing primarily in standing with close supervision as pt not wanting therapist to assist, pt kept stating "I'm okay".  Dressing completed in standing with CGA for standing balance and min assist as pt donning pants in single leg stance with LOB requiring min assist to correct.  Oral care completed in standing with CGA.  Pt with no issues with sequencing during self-care tasks, but noted difficulty with initiation and perseveration when asking for clothing items prior to and during bathing tasks.     OT Evaluation Precautions/Restrictions  Precautions Precautions: Fall;Other (comment) Precaution Comments: recent CPR, but has no c/o pain. Restrictions Weight Bearing Restrictions: No Pain Pain Assessment Pain Scale: 0-10 Pain Score: 0-No pain Home Living/Prior Functioning Home Living Family/patient expects to be discharged to:: Private residence Living Arrangements: Spouse/significant other, Children Available Help at Discharge: Family, Available 24 hours/day Type of Home: House Home Access: Stairs to enter [employer] of Steps: 4-6 steps from various entrances. Entrance Stairs-Rails: Can reach both, Left, Right Home Layout: One level Bathroom Shower/Tub: Multimedia programmer: Handicapped height Bathroom Accessibility: Yes  Lives With: Spouse IADL History Current License: Yes Education: college Occupation: Retired Prior Function Level of Independence: Independent with basic ADLs, Independent with gait  Able to Take Stairs?: Reciprically Driving: Yes Vocation:  On disability Leisure: Hobbies-yes (Comment) Comments: retired, former Customer service manager, Systems analyst ADL ADL Grooming: Contact guard Where Assessed-Grooming: Standing at sink Upper Body Bathing: Supervision/safety Where Assessed-Upper Body Bathing:  Shower Lower Body Bathing: Supervision/safety Where Assessed-Lower Body Bathing: Shower Upper Body Dressing: Contact guard Where Assessed-Upper Body Dressing: Standing at sink Lower Body Dressing: Minimal assistance Where Assessed-Lower Body Dressing: Standing at sink Toilet Transfer: Minimal assistance Toilet Transfer Method: Wellsite geologist Transfer: Minimal assistance Social research officer, government Method: Heritage manager: Civil engineer, contracting with back, Grab bars Vision Baseline Vision/History: Wears glasses Wears Glasses: Reading only Patient Visual Report: No change from baseline Vision Assessment?: No apparent visual deficits;Yes Eye Alignment: Within Functional Limits Ocular Range of Motion: Within Functional Limits Tracking/Visual Pursuits: Able to track stimulus in all quads without difficulty Visual Fields: No apparent deficits Cognition Overall Cognitive Status: Impaired/Different from baseline Arousal/Alertness: Awake/alert Orientation Level: Person;Place;Situation Person: Oriented Place: Oriented Situation: Oriented Year: 2021 Month: February Day of Week: Incorrect(Tuesday) Memory: Impaired Memory Impairment: Decreased short term memory;Decreased recall of new information Decreased Short Term Memory: Verbal basic;Functional basic Immediate Memory Recall: Sock;Blue;Bed Memory Recall Sock: Not able to recall(unable to recall even given choice of 3) Memory Recall Blue: Without Cue Memory Recall Bed: Not able to recall(unable to recall even given choice of 3) Attention: Sustained Sustained Attention: Impaired Sustained Attention Impairment: Verbal basic Awareness: Impaired Awareness Impairment: Intellectual impairment Problem Solving: Impaired Problem Solving Impairment: Verbal basic Executive Function: (all areas impacted by lower level deficits) Safety/Judgment: Impaired Comments: minimizes deficits Sensation Sensation Light Touch: Appears  Intact Proprioception: Appears Intact Coordination Gross Motor Movements are Fluid and Coordinated: No Fine Motor Movements are Fluid and Coordinated: Yes Coordination and Movement Description: decreased BOS w/ gait, meandering gait as fatigues. Finger Nose Finger Test: WNL Motor  Motor Motor: Within Functional Limits Mobility  Bed Mobility Bed Mobility: Sit to Supine;Supine to Sit Supine to Sit: Supervision/Verbal cueing Sit to Supine: Supervision/Verbal cueing Transfers Sit to Stand: Contact Guard/Touching assist  Trunk/Postural Assessment  Cervical Assessment Cervical Assessment: Within Functional Limits Thoracic Assessment Thoracic Assessment: Within Functional Limits Lumbar Assessment Lumbar Assessment: Within Functional Limits Postural Control Postural Control: Deficits on evaluation Righting Reactions: list to side w/ head turn.  Balance Balance Balance Assessed: No Extremity/Trunk Assessment RUE Assessment RUE Assessment: Within Functional Limits General Strength Comments: WFL LUE Assessment LUE Assessment: Within Functional Limits General Strength Comments: WFL     Refer to Care Plan for Long Term Goals  Recommendations for other services: None    Discharge Criteria: Patient will be discharged from OT if patient refuses treatment 3 consecutive times without medical reason, if treatment goals not met, if there is a change in medical status, if patient makes no progress towards goals or if patient is discharged from hospital.  The above assessment, treatment plan, treatment alternatives and goals were discussed and mutually agreed upon: by patient  Ellwood Dense [facility] 06/05/2019, 12:30 PM

## 2019-06-05 NOTE — Evaluation (Signed)
Speech Language Pathology Assessment and Plan  Patient Details  Name: Lenon Kuennen. [name]: [id number] Date of Birth: May 20, 1958  SLP Diagnosis: Cognitive Impairments;Dysarthria  Rehab Potential: Good ELOS: 7 days 7 days   Today's Date: 06/05/2019 SLP Individual Time: 6283-6629 SLP Individual Time Calculation (min): 60 min   Problem List:  Patient Active Problem List   Diagnosis Date Noted  . Leukopenia   . Thrombocytopenia ([place])   . Anemia of chronic disease   . History of ventricular tachycardia   . Anoxic brain injury ([place]) 06/04/2019  . Status post creation of arteriovenous fistula 05/29/2019  . Cardiac arrest ([place]) 05/29/2019  . Unspecified atrial flutter ([place]) 05/21/2019  . Atrial tachycardia ([place]) 02/06/2019  . Pseudoaneurysm of AV hemodialysis fistula, initial encounter ([place]) 04/14/2017  . Status post right inguinal hernia repair-July 2017 11/06/2015  . ESRD on dialysis ([place]) 09/15/2015  . Hypersomnia with sleep apnea 09/15/2015  . Bradycardia, drug induced 09/15/2015  . OSA on CPAP 03/24/2014  . Hypoxemia 12/12/2013  . Hypersomnia with sleep apnea, unspecified 12/12/2013  . PVC's // RBBB sup Axis 05/15/2013  . Nonischemic cardiomyopathy ([place])   . VT (ventricular tachycardia) ([place]) 05/13/2013   Past Medical History:  Past Medical History:  Diagnosis Date  . Atrial arrhythmia    atrial tachycardia with variable AV conduction versus atypical aflutter 01/10/19, rate control (02/06/19)  . Cataract   . Dyspnea    on exertion  . ESRD (end stage renal disease) ([place])    Hemo- MWF, Polycystic kidney disease  . Fatigue   . History of kidney stones    removal of stone- cysto  . Hyperlipidemia   . Hyperparathyroidism, secondary renal ([place])   . Hypertension   . Hypoxemia 12/12/2013  . Nonischemic cardiomyopathy ([place])    Er 25% 2015, 55 % 2013  . OSA on CPAP    no longer using cpap  . OSA on CPAP 03/24/2014  . Pneumonia    2015ish  . Ventricular tachycardia//Freq  PVCs   . Wears glasses    Past Surgical History:  Past Surgical History:  Procedure Laterality Date  . A-FLUTTER ABLATION N/A 04/11/2019   Procedure: A-FLUTTER ABLATION;  Surgeon: Evans Lance, MD;  Location: [facility];  Service: Cardiovascular;  Laterality: N/A;  . A/V FISTULAGRAM Left 04/27/2017   Procedure: A/V FISTULAGRAM;  Surgeon: Conrad Rodney, MD;  Location: [facility];  Service: Cardiovascular;  Laterality: Left;  lt arm  . A/V FISTULAGRAM Left 01/10/2019   Procedure: A/V FISTULAGRAM;  Surgeon: Marty Heck, MD;  Location: [facility];  Service: Cardiovascular;  Laterality: Left;  . APPENDECTOMY    . AV FISTULA PLACEMENT  12/05/2011   Procedure: ARTERIOVENOUS (AV) FISTULA CREATION;LLEFT ARM  Surgeon: Conrad Socastee, MD;  Location: [facility];  Service: Vascular;  Laterality: Left;  RADIO-CEPHALIC  fistula left arm  . AV FISTULA PLACEMENT  01/11/2012   Procedure: ARTERIOVENOUS (AV) FISTULA CREATION;  Surgeon: Conrad Jane, MD;  Location: [facility];  Service: Vascular;  Laterality: Left;  Creation of left brachial cephalic arteriovenous fistula  . AV FISTULA PLACEMENT Right 03/07/2019   Procedure: ARTERIOVENOUS (AV) FISTULA CREATION  RIGHT ARM;  Surgeon: Marty Heck, MD;  Location: [facility];  Service: Vascular;  Laterality: Right;  . BASCILIC VEIN TRANSPOSITION Left 12/27/2016   Procedure: BASILIC VEIN TRANSPOSITION LEFT UPPER ARM FIRST STAGE;  Surgeon: Conrad [place] Hills, MD;  Location: [facility];  Service: Vascular;  Laterality: Left;  .  BASCILIC VEIN TRANSPOSITION Left 01/31/2017   Procedure: LEFT ARM BASILIC VEIN TRANSPOSITION, SECOND STAGE;  Surgeon: Conrad Hermosa, MD;  Location: [facility];  Service: Vascular;  Laterality: Left;  . CARDIAC CATHETERIZATION  04-05-2010   checking for blockage but none-[facility]  . COLONOSCOPY    . CYSTOSCOPY W/ STONE MANIPULATION     "laser once" (01/22/2013)  . HEMATOMA EVACUATION Left 05/09/2017   Procedure: EVACUATION HEMATOMA LEFT ARM;   Surgeon: Conrad Doniphan, MD;  Location: [facility];  Service: Vascular;  Laterality: Left;  . HERNIA REPAIR    . INGUINAL HERNIA REPAIR Right 11/06/2015   Procedure: OPEN HERNIA REPAIR  RIGHT INGUINAL ADULT;  Surgeon: [name], MD;  Location[facility];  Service: General;  Laterality: Right;  with MESH  . INSERTION OF DIALYSIS CATHETER Right 10/05/2016   Procedure: INSERTION OF right internal jugular DIALYSIS CATHETER;  Surgeon: [name], MD;  Location: [facility];  Service: Vascular;  Laterality: Right;  . INSERTION OF MESH  03/20/2012   Procedure: INSERTION OF MESH;  UMB Surgeon: [name], MD;  Location: [facility];  Service: General;  Laterality: N/A;  . INSERTION OF MESH N/A 01/22/2013   Procedure: INSERTION OF MESH;  Surgeon: [name], MD;  Location: [facility];  Service: General;  Laterality: N/A;  . LAPAROTOMY  04/02/2012   Procedure: EXPLORATORY LAPAROTOMY;  Surgeon: [name], MD;  Location: [facility];  Service: General;  Laterality: N/A;  Exploratory Laparotomy with resection of small intestine  . LEFT HEART CATHETERIZATION WITH CORONARY ANGIOGRAM N/A 05/14/2013   Procedure: LEFT HEART CATHETERIZATION WITH CORONARY ANGIOGRAM;  Surgeon: Sinclair Grooms, MD;  [facility];  Service: Cardiovascular;  Laterality: N/A;  . LIGATION OF ARTERIOVENOUS  FISTULA Left 12/27/2016   Procedure: LIGATION/EXCISION OF LEFT UPPER ARM ARTERIOVENOUS  FISTULA;  EVACUATION OF HEMATOMA;  Surgeon: Conrad , MD;  Location: [facility];  Service: Vascular;  Laterality: Left;  . LIGATION OF ARTERIOVENOUS  FISTULA Left 03/07/2019   Procedure: LIGATION OF ARTERIOVENOUS FISTULA  LEFT UPPER ARM;  Surgeon: Marty Heck, MD;  Location: [facility];  Service: Vascular;  Laterality: Left;  . REVISON OF ARTERIOVENOUS FISTULA Left 10/05/2016   Procedure: REVISON OF left arm ARTERIOVENOUS FISTULA;  Surgeon: [name], MD;  Location: [facility];  Service: Vascular;  Laterality: Left;  . TONSILLECTOMY    . UMBILICAL  HERNIA REPAIR  03/20/2012   Procedure: HERNIA REPAIR UMBILICAL ADULT;  Surgeon: [name], MD;  Location: [facility];  Service: General;  Laterality: N/A;  . UMBILICAL HERNIA REPAIR  01/22/2013   preperitoneal open procedure due to significant adhesions/notes 01/22/2013  . VENTRAL HERNIA REPAIR N/A 01/22/2013   Procedure: ATTEMPTED LAPAROSCOPIC VENTRAL HERNIA CONVERTED TO OPEN;  Surgeon: [name], MD;  Location: [facility];  Service: General;  Laterality: N/A;    Assessment / Plan / Recommendation Clinical Impression Mickel Schreur is a 64 year old male with history of OSA, retinopathy - getting injections in his eyes, NICM, VT/A flutter s/p ablation 04/2019, ESRD with HD MWF who was admitted on 05/29/19 for AV fistula v/s graft placement due to problems with prior access. Prior to initiation of procedure, patient sustained cardiac arrest requiring CPR X 4 mintues with ACLS protocol prior to ROSC.  Arrest felt to be respiratory in nature due to sedatives and rapid decline in compromised RV function as bedside echo reported to show global hypokinesis with EF 20%.  He underwent HD to optimize volume  status.  He had issues with agitation requiring sedation. EEG showed diffuse encephalopathy. CT head showed generalized volume loss without acute changes. He tolerated extubation on 01/30 but continued to have cognitive deficits with slurred speech.  MRI brain showed mild brain atrophy and no signs of anoxic injury. He has had issues with agitation as well as cognitive deficits with delayed responses, poor safety awareness, fatigue and unsteady gait affecting mobility and ADLs. CIR recommended due to anoxic BI 06/04/19.   Cognitive lingustic evaluation completed on 06/05/19. Pt presents with moderate cognitive impairments characteristic of anoxic encelopahty. Pt with deficits in sustained attention, memory (recall and working memory), delayed processing, basic problem solving and no awareness of deficits. Pt  oriented to self, month, basic location ("hospital") but disoriented to city and name of hospital, year, day of week, situation and president. Pt requires more than a reasonable time to formulate thoughts/answer questions, sequence, follow basic directions and recall of information.  Pt also presents with decreased speech intelligibility d/t generalized imprecise articulation. Pt's speech is ~ 80% intelligible at the sentence level. Connected speech is more severely impacted and is ~ 50% intelligibility. Skilled ST is required to target the above mentioned deficits, increase functional independence and reduce caregiver burden. At this time, expect pt will require a moderate level of assistance to complete cognitive tasks safely with HHST follow up.      Skilled Therapeutic Interventions          [phone] Skilled ST therapy session targeted use of external aids for recall of orientation information. SLP provided pt with newspaper and required Max A verbal cues to locate date on front page of paper.    SLP Assessment  Patient will need skilled [facility] during CIR admission    Recommendations  Recommendations for Other Services: Neuropsych consult Patient destination: Home Follow up Recommendations: Home Health SLP;24 hour supervision/assistance Equipment Recommended: None recommended by SLP    SLP Frequency 3 to 5 out of 7 days   SLP Duration  SLP Intensity  SLP Treatment/Interventions 7 days  Minumum of 1-2 x/day, 30 to 90 minutes  Cognitive remediation/compensation;Functional tasks;Therapeutic Activities;Internal/external aids;Patient/family education;Medication managment    Pain    Prior Functioning Cognitive/Linguistic Baseline: Information not available Type of Home: House  Lives With: Spouse Available Help at Discharge: Family;Available 24 hours/day Education: college Vocation: On disability  SLP Evaluation Cognition Overall Cognitive Status:  Impaired/Different from baseline Arousal/Alertness: Awake/alert Orientation Level: Oriented to person;Oriented to place;Oriented to time Attention: Sustained Sustained Attention: Impaired Sustained Attention Impairment: Verbal basic Memory: Impaired Memory Impairment: Decreased short term memory;Decreased recall of new information Decreased Short Term Memory: Verbal basic;Functional basic Awareness: Impaired Awareness Impairment: Intellectual impairment Problem Solving: Impaired Problem Solving Impairment: Verbal basic Executive Function: (all areas impacted by lower level deficits) Safety/Judgment: Impaired Comments: minimizes deficits  Comprehension Auditory Comprehension Overall Auditory Comprehension: Appears within functional limits for tasks assessed Commands: Impaired(impacted by memory deficits) One Step Basic Commands: 0-24% accurate Interfering Components: Attention;Working memory;Processing speed EffectiveTechniques: Conservation officer, nature: Within Function Limits Reading Comprehension Reading Status: Within funtional limits Expression Expression Primary Mode of Expression: Verbal Verbal Expression Overall Verbal Expression: Appears within functional limits for tasks assessed(impacted by deficits in cognition) Written Expression Dominant Hand: Right Written Expression: Within Functional Limits Oral Motor Oral Motor/Sensory Function Overall Oral Motor/Sensory Function: Generalized oral weakness Motor Speech Overall Motor Speech: Impaired Respiration: Within functional limits Phonation: Normal Resonance: Within functional limits Articulation: Impaired Level of Impairment: Sentence Intelligibility: Intelligibility  reduced Word: 75-100% accurate Phrase: 75-100% accurate Sentence: 75-100% accurate Conversation: 50-74% accurate Motor Planning: Witnin functional limits Motor Speech Errors: Not  applicable   Short Term Goals: Week 1: SLP Short Term Goal 1 (Week 1): STGs = LTGs d/t short ELOS  Refer to Care Plan for Long Term Goals  Recommendations for other services: Neuropysch  Discharge Criteria: Patient will be discharged from SLP if patient refuses treatment 3 consecutive times without medical reason, if treatment goals not met, if there is a change in medical status, if patient makes no progress towards goals or if patient is discharged from hospital.  The above assessment, treatment plan, treatment alternatives and goals were discussed and mutually agreed upon: by patient  Bryonna Sundby 06/05/2019, 12:05 PM

## 2019-06-05 NOTE — Progress Notes (Signed)
Inpatient Rehabilitation  Patient information reviewed and entered into eRehab system by Destyn Schuyler M. Mena Simonis, M.A., CCC/SLP, PPS Coordinator.  Information including medical coding, functional ability and quality indicators will be reviewed and updated through discharge.    

## 2019-06-06 ENCOUNTER — Inpatient Hospital Stay (HOSPITAL_COMMUNITY): Payer: Medicare Other | Admitting: Speech Pathology

## 2019-06-06 ENCOUNTER — Inpatient Hospital Stay (HOSPITAL_COMMUNITY): Payer: Medicare Other | Admitting: Occupational Therapy

## 2019-06-06 ENCOUNTER — Inpatient Hospital Stay (HOSPITAL_COMMUNITY): Payer: Medicare Other

## 2019-06-06 NOTE — Progress Notes (Signed)
Speech Language Pathology Daily Session Note  Patient Details  Name: Michael [name]. [name]: [id number] Date of Birth: Dec 28, 1955  Today's Date: 06/06/2019 SLP Individual Time: 1330-1400 SLP Individual Time Calculation (min): 30 min  Short Term Goals: Week 1: SLP Short Term Goal 1 (Week 1): STGs = LTGs d/t short ELOS  Skilled Therapeutic Interventions:  Skilled treatment session targeted cognition goals. SLP facilitated session by providing Mod A to identify acute physical and cognitive changes. Pt was frustrated with bed alarm being on but despite Mod A he was not able to process rationale. As such he continues to demonstrate decreased insight into situation and need for safety. Pt was oriented with supervision level cues (this is improvement). Pt left sitting edge of bed, bed alarm on and all needs within reach.       Pain    Therapy/Group: Individual Therapy  Delanie Tirrell 06/06/2019, 3:38 PM

## 2019-06-06 NOTE — Progress Notes (Signed)
Physical Therapy Session Note  Patient Details  Name: Michael [name]. [name]: [id number] Date of Birth: [birth year]  Today's Date: 06/06/2019 PT Individual Time: 1638-4665 PT Individual Time Calculation (min): 60 min   Short Term Goals: Week 1:  PT Short Term Goal 1 (Week 1): STG=LTGs due to LOS.  Skilled Therapeutic Interventions/Progress Updates:    Session focused on addressing functional gait, balance, and standardized balance assessments to assess fall risk. Pt performed functional transfers without AD throughout session with close supervision to [place]. Administered the below mentioned standardized fall risk assessments and educated patient on results.   Engaged in dynamic gait training while performing dual task activities to increase demand including head turns, locating and picking up objects at various heights, quick starts and stops, and turns. Initially performed without AD and noted to require only intermittent CGA and close supervision with slower cadence when performing these tasks (often would stop completely to locate items on the wall during gait). Performed gait on ramp and then mulched surface for community mobility training and curb step negotiation with overall CGA. Trial with SPC during gait in hallway and then through dynamic tasks such as head turns, quick starts and stops, and turning at overall close supervision level. Pt able to perform several gait trials > 150' without reported decreased endurance.   Quadruped for core and hip strengthening exercises while performing alternating arm raises, leg raises, and then bird dogs to increase challenge x 10 reps. Pt requires steady assist during activity for balance and cues for improved technique. As challenge increased, pt with less noted trunk stabilization and decreased control in hip musculature. Would benefit from continued practice and floor transfer practice as well.   Pt denies pain with any of these activities.   Therapy  Documentation Precautions:  Precautions Precautions: Fall, Other (comment) Precaution Comments: recent CPR, but has no c/o pain. Restrictions Weight Bearing Restrictions: No   Pain:  Denies pain.    Balance: Standardized Balance Assessment Standardized Balance Assessment: Berg Balance Test;Timed Up and Go Test Berg Balance Test Sit to Stand: Able to stand without using hands and stabilize independently Standing Unsupported: Able to stand safely 2 minutes Sitting with Back Unsupported but Feet Supported on Floor or Stool: Able to sit safely and securely 2 minutes Stand to Sit: Sits safely with minimal use of hands Transfers: Able to transfer with verbal cueing and /or supervision Standing Unsupported with Eyes Closed: Able to stand 10 seconds with supervision Standing Ubsupported with Feet Together: Able to place feet together independently and stand 1 minute safely From Standing, Reach Forward with Outstretched Arm: Can reach confidently >25 cm (10") From Standing Position, Pick up Object from Floor: Able to pick up shoe safely and easily From Standing Position, Turn to Look Behind Over each Shoulder: Turn sideways only but maintains balance Turn 360 Degrees: Able to turn 360 degrees safely but slowly Standing Unsupported, Alternately Place Feet on Step/Stool: Able to complete >2 steps/needs minimal assist Standing Unsupported, One Foot in Front: Able to take small step independently and hold 30 seconds Standing on One Leg: Tries to lift leg/unable to hold 3 seconds but remains standing independently Total Score: 41 Timed Up and Go Test TUG: Normal TUG Normal TUG (seconds): 22.8   Five times Sit to Stand Test (FTSS) Method: Use a straight back chair with a solid seat that is 16-18" high. Ask participant to sit on the chair with arms folded across their chest.   Instructions: "Stand up and sit down  as quickly as possible 5 times, keeping your arms folded across your chest."    Measurement: Stop timing when the participant stands the 5th time.  TIME: ___21___ (in seconds)  Times > 13.6 seconds is associated with increased disability and morbidity (Guralnik, 2000) Times > 15 seconds is predictive of recurrent falls in healthy individuals aged 4 and older (Buatois, et al., 2008) Normal performance values in community dwelling individuals aged 47 and older (Bohannon, 2006): o 60-69 years: 11.4 seconds o 70-79 years: 12.6 seconds o 80-89 years: 14.8 seconds  MCID: ? 2.3 seconds for Vestibular Disorders (Meretta, 2006)  Exercises:   Other Treatments:      Therapy/Group: Individual Therapy  Canary Brim Ivory Broad, PT, DPT, CBIS  06/06/2019, 10:04 AM

## 2019-06-06 NOTE — Progress Notes (Signed)
Responded to patient up and out of bed due to call bell going off. When informed that he needed to call for help before getting up, patient stated he would and proceeded to walk to the restroom. Patient asked staff to stay outside of bathroom, when waiting staff heard shower turn on and patient stated he wanted to clean up. No further complications at this time. Audie Clear, LPN

## 2019-06-06 NOTE — Progress Notes (Signed)
Subjective:  Feeling well today.  Had HD yesterday UF 2L.   Objective Vital signs in last 24 hours: Vitals:   06/05/19 1743 06/05/19 2019 06/06/19 0250 06/06/19 0600  BP: 132/63 113/78 135/83   Pulse: 62 (!) 59 70   Resp:   17   Temp: 97.8 F (36.6 C) 98.4 F (36.9 C) 98.5 F (36.9 C)   TempSrc: Axillary     SpO2: 96% 98% 100%   Weight: 79 kg   79.1 kg   Weight change: -3 kg  Physical Exam: General: at sink, energetic  NAD , OX3 Heart: irreg, no rub  Lungs: un labored  breathing , CTA  now Abdomen: BS pos , soft , nt, nd Extremities: Bilat. Lower extrem pedal edema trace Dialysis Access: R IJ  Perm Cath    Dialysis Orders: [facility] MWF 4.5h 400/800 79kg 2/2 bath TDC Hep 4000 - no esa active - last Hb 13.9 on 1/22 - parsabiv 2.5 mg 3 x/week - hectorol 2 mcg IV each tx  Problem/Plan: 1.  Cardiac Arrest - SP Cooling Protocol= / card / EP following   2. ESRD - HD  mwf , use TDC  For now , hd tomorrow 3. Acute Hypoxic Resp. Failure sp Vent = resolved  now on RA  And CIR 4. Nonischemic CM  EF  2 d echo yest "<20%" EP and Card following 5. HTN/volume -  bp stable, on Metoprol 12.5 mg bid  / CXR 1/27 - vasc congestion last hd  Looks ok now with post wt 79kg ? Gaining now that he's rehabbing 6. Anemia - HGB 12+  No esa needs  7. Secondary hyperparathyroidism -phos 10.4 > 6.2 binders  Ca corec 9.9  / Renvela/   as binders  , on Hec  2 mcg 8. Nutrition= Nepro / renal diet/  Renal vit  9. OSA   Jannifer Hick MD [facility] Pager [phone]  Labs: Basic Metabolic Panel: Recent Labs  Lab 06/02/19 0450 06/02/19 2006 06/03/19 0552 06/04/19 0449 06/05/19 1346  NA 137   < > 139 137 137  K 4.6   < > 5.0 4.2 4.4  CL 96*   < > 94* 98 97*  CO2 26   < > 26 26 24   GLUCOSE 90   < > 70 91 109*  BUN 44*   < > 51* 35* 46*  CREATININE 9.71*   < > 11.51* 9.67* 11.14*  CALCIUM 9.1   < > 9.4 8.8* 9.3  PHOS 10.4*  --   --   --  6.2*   < > = values in this interval not  displayed.   Liver Function Tests: Recent Labs  Lab 06/01/19 0418 06/02/19 0450 06/05/19 1346  AST 38  --   --   ALT 6  --   --   ALKPHOS 50  --   --   BILITOT 1.5*  --   --   PROT 6.0*  --   --   ALBUMIN 2.8* 2.6* 2.9*   No results for input(s): LIPASE, AMYLASE in the last 168 hours. No results for input(s): AMMONIA in the last 168 hours. CBC: Recent Labs  Lab 05/31/19 0639 06/01/19 0338 06/01/19 0500 06/01/19 0500 06/03/19 0552 06/04/19 0449 06/05/19 1346  WBC 4.8  --  6.1   < > 4.1 3.9* 4.6  NEUTROABS  --   --   --   --   --   --  3.0  HGB 13.5   < >  14.5   < > 12.2* 12.9* 14.4  HCT 42.9   < > 47.0   < > 41.2 43.2 46.7  MCV 80.8  --  81.9  --  85.8 85.4 83.1  PLT 150  --  170   < > 135* 128* 180   < > = values in this interval not displayed.   Cardiac Enzymes: No results for input(s): CKTOTAL, CKMB, CKMBINDEX, TROPONINI in the last 168 hours. CBG: Recent Labs  Lab 06/01/19 0416 06/01/19 0657 06/01/19 0809 06/01/19 1150 06/01/19 1533  GLUCAP 73 79 81 85 92    Medications:  . acyclovir  400 mg Oral Q lunch  . aspirin EC  81 mg Oral Daily  . atorvastatin  40 mg Oral QHS  . Chlorhexidine Gluconate Cloth  6 each Topical Q0600  . dorzolamide-timolol  1 drop Both Eyes BID  . doxercalciferol  2 mcg Intravenous Q M,W,F-HD  . feeding supplement (NEPRO CARB STEADY)  237 mL Oral BID BM  . heparin  5,000 Units Subcutaneous Q8H  . metoprolol succinate  12.5 mg Oral Daily  . multivitamin  1 tablet Oral QHS  . pantoprazole  40 mg Oral Daily  . prednisoLONE acetate  1 drop Right Eye Q1200  . sevelamer carbonate  1,600 mg Oral TID WC

## 2019-06-06 NOTE — Progress Notes (Signed)
Occupational Therapy Session Note  Patient Details  Name: Michael [name]. [name]: [id number] Date of Birth: 1956-02-12  Today's Date: 06/06/2019 OT Individual Time: 6387-5643 OT Individual Time Calculation (min): 68 min    Short Term Goals: Week 1:  OT Short Term Goal 1 (Week 1): STG = LTGs due to ELOS  Skilled Therapeutic Interventions/Progress Updates:    Patient seated edge of bed, alert and aware of current needs.  Ambulation in room and on unit to therapy gym with CS.  Completed dynavision reaction time activities in stance - 2 minutes, whole screen trial 1 = 1.52 sec, trial 2 = 1.33 sec, no LOB.  Ambulated to 2nd treatment gym with CS.  Seated on mat completed MVPT - 28/36.  Reviewed importance of perceptual skills and results of assessment, mild impulsivity noted - he did not make errors all in one or 2 sections - taking time to look closely at each potential option may have changed result.  Ambulated with CS to third therapy gym.  Seated in chair without arms completed puzzle task with moderate cues to complete.  Ambulated back to room, seated edge of bed at close of session with bed alarm set, able to recall 2 of 3 tasks completed, reviewed STM strategies.  Tray table and call bell in reach.    Therapy Documentation Precautions:  Precautions Precautions: Fall, Other (comment) Precaution Comments: recent CPR, but has no c/o pain. Restrictions Weight Bearing Restrictions: No General:   Vital Signs: Therapy Vitals Temp: 98 F (36.7 C) Temp Source: Oral Pulse Rate: 74 Resp: 18 BP: 125/78 Patient Position (if appropriate): Sitting Oxygen Therapy SpO2: 100 % O2 Device: Room Air Pain: Pain Assessment Pain Scale: 0-10 Pain Score: 0-No pain Other Treatments:     Therapy/Group: Individual Therapy  Carlos Levering 06/06/2019, 4:00 PM

## 2019-06-06 NOTE — Progress Notes (Signed)
[place] PHYSICAL MEDICINE & REHABILITATION PROGRESS NOTE  Subjective/Complaints: Patient seen sitting up at the edge of his bed this morning, but sitting balance noted.  He states he slept well overnight.  He states he had good first day of therapies yesterday.  He does not remember me from yesterday, but appears more oriented and alert in general.  Discussed goals with therapies.  He was seen by nephro yesterday, notes reviewed-continue current treatment.  ROS: Denies CP, SOB, N/V/D.  Objective: Vital Signs: Blood pressure 135/83, pulse 70, temperature 98.5 F (36.9 C), resp. rate 17, weight 79.1 kg, SpO2 100 %. ECHOCARDIOGRAM COMPLETE  Result Date: 06/04/2019   ECHOCARDIOGRAM REPORT   Patient Name:   Michael [name]. Date of Exam: 06/04/2019 Medical Rec #:  [id number]          Height:       66.0 in Accession #:    [id number]         Weight:       176.8 lb Date of Birth:  03-Jul-1955          BSA:          1.90 m Patient Age:    64 years           BP:           142/132 mmHg Patient Gender: M                  HR:           70 bpm. Exam Location:  Inpatient Procedure: 2D Echo, Cardiac Doppler and Color Doppler Indications:    Abnormal EKG, s/p Cardiac arrest  History:        Patient has prior history of Echocardiogram examinations, most                 recent 07/23/2013. Cardiomyopathy, Abnormal ECG,                 Arrythmias:Cardiac Arrest, Atrial Flutter and Vtach; Risk                 Factors:Hypertension and Dyslipidemia. ESRD.  Sonographer:    Dustin Flock Referring Phys: [id number] [place]  1. Left ventricular ejection fraction, by visual estimation, is <20%. The left ventricle has severely decreased function. There is no left ventricular hypertrophy.  2. Severely dilated left ventricular internal cavity size.  3. The left ventricle demonstrates global hypokinesis.  4. Left ventricular diastolic parameters are consistent with Grade II diastolic dysfunction (pseudonormalization).   5. Global right ventricle has normal systolic function.The right ventricular size is normal. No increase in right ventricular wall thickness.  6. Left atrial size was severely dilated.  7. Right atrial size was severely dilated.  8. The mitral valve is normal in structure. Trivial mitral valve regurgitation. No evidence of mitral stenosis.  9. The tricuspid valve is normal in structure. Tricuspid valve regurgitation is mild. 10. The aortic valve is tricuspid. Aortic valve regurgitation is mild. Moderate aortic valve sclerosis/calcification without any evidence of aortic stenosis. 11. The pulmonic valve was normal in structure. Pulmonic valve regurgitation is not visualized. 12. There is mild dilatation of the aortic root measuring 41 mm. 13. Normal pulmonary artery systolic pressure. 14. The inferior vena cava is dilated in size with >50% respiratory variability, suggesting right atrial pressure of 8 mmHg. 15. The tricuspid regurgitant velocity is 2.04 m/s, and with an assumed right atrial pressure of 8 mmHg, the estimated right ventricular systolic pressure  is normal at 24.6 mmHg. 16. Small pericardial effusion. 17. The pericardial effusion is posterior to the left ventricle. FINDINGS  Left Ventricle: Left ventricular ejection fraction, by visual estimation, is <20%. The left ventricle has severely decreased function. The left ventricle demonstrates global hypokinesis. The left ventricular internal cavity size was severely dilated left ventricle. There is no left ventricular hypertrophy. Left ventricular diastolic parameters are consistent with Grade II diastolic dysfunction (pseudonormalization). Normal left atrial pressure. Right Ventricle: The right ventricular size is normal. No increase in right ventricular wall thickness. Global RV systolic function is has normal systolic function. The tricuspid regurgitant velocity is 2.04 m/s, and with an assumed right atrial pressure  of 8 mmHg, the estimated right  ventricular systolic pressure is normal at 24.6 mmHg. Left Atrium: Left atrial size was severely dilated. Right Atrium: Right atrial size was severely dilated Pericardium: A small pericardial effusion is present. The pericardial effusion is posterior to the left ventricle. Mitral Valve: The mitral valve is normal in structure. Trivial mitral valve regurgitation. No evidence of mitral valve stenosis by observation. Tricuspid Valve: The tricuspid valve is normal in structure. Tricuspid valve regurgitation is mild. Aortic Valve: The aortic valve is tricuspid. Aortic valve regurgitation is mild. Aortic regurgitation PHT measures 650 msec. Moderate aortic valve sclerosis/calcification is present, without any evidence of aortic stenosis. Pulmonic Valve: The pulmonic valve was normal in structure. Pulmonic valve regurgitation is not visualized. Pulmonic regurgitation is not visualized. Aorta: The aortic root, ascending aorta and aortic arch are all structurally normal, with no evidence of dilitation or obstruction and aortic dilatation noted. There is mild dilatation of the aortic root measuring 41 mm. Venous: The inferior vena cava is dilated in size with greater than 50% respiratory variability, suggesting right atrial pressure of 8 mmHg. IAS/Shunts: No atrial level shunt detected by color flow Doppler. There is no evidence of a patent foramen ovale. No ventricular septal defect is seen or detected. There is no evidence of an atrial septal defect.  LEFT VENTRICLE PLAX 2D LVIDd:         6.90 cm  Diastology LVIDs:         6.25 cm  LV e' lateral:   6.98 cm/s LV PW:         1.01 cm  LV E/e' lateral: 12.2 LV IVS:        1.17 cm  LV e' medial:    7.58 cm/s LVOT diam:     2.70 cm  LV E/e' medial:  11.3 LV SV:         50 ml LV SV Index:   25.44 LVOT Area:     5.73 cm  RIGHT VENTRICLE RV Basal diam:  3.20 cm RV S prime:     8.63 cm/s TAPSE (M-mode): 2.0 cm LEFT ATRIUM              Index       RIGHT ATRIUM           Index LA  diam:        4.40 cm  2.32 cm/m  RA Area:     25.20 cm LA Vol (A2C):   116.0 ml 61.13 ml/m RA Volume:   82.60 ml  43.53 ml/m LA Vol (A4C):   118.0 ml 62.18 ml/m LA Biplane Vol: 124.0 ml 65.34 ml/m  AORTIC VALVE LVOT Vmax:   84.90 cm/s LVOT Vmean:  52.500 cm/s LVOT VTI:    0.156 m AI PHT:      650  msec  AORTA Ao Root diam: 4.10 cm MITRAL VALVE                        TRICUSPID VALVE MV Area (PHT): 6.54 cm             TR Peak grad:   16.6 mmHg MV PHT:        33.64 msec           TR Vmax:        214.00 cm/s MV Decel Time: 116 msec MV E velocity: 85.30 cm/s 103 cm/s  SHUNTS MV A velocity: 45.00 cm/s 70.3 cm/s Systemic VTI:  0.16 m MV E/A ratio:  1.90       1.5       Systemic Diam: 2.70 cm  Fransico Him MD Electronically signed by Fransico Him MD Signature Date/Time: 06/04/2019/3:40:46 PM    Final    Recent Labs    06/04/19 0449 06/05/19 1346  WBC 3.9* 4.6  HGB 12.9* 14.4  HCT 43.2 46.7  PLT 128* 180   Recent Labs    06/04/19 0449 06/05/19 1346  NA 137 137  K 4.2 4.4  CL 98 97*  CO2 26 24  GLUCOSE 91 109*  BUN 35* 46*  CREATININE 9.67* 11.14*  CALCIUM 8.8* 9.3    Physical Exam: BP 135/83   Pulse 70   Temp 98.5 F (36.9 C)   Resp 17   Wt 79.1 kg   SpO2 100%   BMI 28.15 kg/m  Constitutional: No distress . Vital signs reviewed. HENT: Normocephalic.  Atraumatic. Eyes: EOMI. No discharge. Cardiovascular: No JVD. Respiratory: Normal effort.  No stridor. GI: Non-distended. Skin: Warm and dry.  Intact. Psych: Normal mood.  Normal behavior. Musc: Lower extremity edema. Neuro: Alert and oriented x4 Motor: Grossly 4+/5 throughout, unchanged  Assessment/Plan: 1. Functional deficits secondary to anoxic BI which require 3+ hours per day of interdisciplinary therapy in a comprehensive inpatient rehab setting.  Physiatrist is providing close team supervision and 24 hour management of active medical problems listed below.  Physiatrist and rehab team continue to assess barriers to  discharge/monitor patient progress toward functional and medical goals  Care Tool:  Bathing    Body parts bathed by patient: Right arm, Left arm, Chest, Abdomen, Front perineal area, Buttocks, Right upper leg, Left upper leg, Face         Bathing assist Assist Level: Supervision/Verbal cueing     Upper Body Dressing/Undressing Upper body dressing   What is the patient wearing?: Pull over shirt    Upper body assist Assist Level: Contact Guard/Touching assist    Lower Body Dressing/Undressing Lower body dressing      What is the patient wearing?: Pants     Lower body assist Assist for lower body dressing: Minimal Assistance - Patient > 75%     Toileting Toileting    Toileting assist Assist for toileting: Independent with assistive device Assistive Device Comment: urinal   Transfers Chair/bed transfer  Transfers assist     Chair/bed transfer assist level: Minimal Assistance - Patient > 75%     Locomotion Ambulation   Ambulation assist      Assist level: Contact Guard/Touching assist Assistive device: Walker-rolling(w/ and w/o RW) Max distance: 75'   Walk 10 feet activity   Assist     Assist level: Contact Guard/Touching assist Assistive device: Walker-rolling   Walk 50 feet activity   Assist    Assist level: Contact Guard/Touching  assist Assistive device: Walker-rolling    Walk 150 feet activity   Assist Walk 150 feet activity did not occur: Safety/medical concerns(increased meandering gait.)         Walk 10 feet on uneven surface  activity   Assist Walk 10 feet on uneven surfaces activity did not occur: Safety/medical concerns         Wheelchair     Assist Will patient use wheelchair at discharge?: No Type of Wheelchair: Manual    Wheelchair assist level: Supervision/Verbal cueing Max wheelchair distance: 3'    Wheelchair 50 feet with 2 turns activity    Assist    Wheelchair 50 feet with 2 turns activity  did not occur: Safety/medical concerns       Wheelchair 150 feet activity     Assist Wheelchair 150 feet activity did not occur: Safety/medical concerns          Medical Problem List and Plan: 1.  Impaired mobility and ADLs secondary to anoxic BI.  Continue CIR 2.  Antithrombotics: -DVT/anticoagulation:  Pharmaceutical: Coumadin and Heparin             -antiplatelet therapy: ASA 3. Pain Management: Tylenol prn 4. Mood: LCSW to follow for evaluation and support.              -antipsychotic agents: N/A 5. Neuropsych: This patient is not fully capable of making decisions on his own behalf. Impulsive, requires safety cues.  6. Skin/Wound Care: routine pressure relief measures.  7. Fluids/Electrolytes/Nutrition: Strict I/O. Lytes with HD 8. ESRD: HD MWF at the end of the day to help with tolerance of therapy during the day.  9. H/o VT/ A flutter/NICM: Low dose BB added.   Relatively controlled on 2/4  Monitor with increased mobility. 10. Anemia of chronic disease:  Monitor H/H.             Hemoglobin 14.4 on 2/3, likely concentrated component  Cont to monitor  11. Thrombocytopenia:  Platelets 180 on 2/3, likely concentrated component 12. Retinopathy/Glaucoma?: Continue Cosopt bid with pred forte in right eye.  13. Leukopenia  WBCs 4.6 on 2/3  Continue to monitor  LOS: 2 days A FACE TO FACE EVALUATION WAS PERFORMED  Shaily Librizzi Lorie Phenix 06/06/2019, 8:20 AM

## 2019-06-06 NOTE — Progress Notes (Signed)
Social Work Patient ID: [name], male   DOB: [birth year], 64 y.o.   [name]: [id number]   SW called pt wife [name] [phone]) to follow-up FMLA forms that will be completed by medical team, and SW will fax over by 06/10/2019. SW informed pt on schedule for family education on Saturday 06/08/2019 10am-12pm. SW also reiterated that while patient's physical goal is 24/7 care, pt will require 24/7care due to cognitive deficits. She continues to report that she has observed significant changes, and she is working on her adult son to stay with pt during the day while she is at work, since their son that lives in the home is Autistic and not able to differentiate from right/wrong. SW will follow-up with further recommendations closer towards discharge date.   Loralee Pacas, MSW, Social Worker Office (preferred): [phone] Cell: [phone] Fax: [phone]

## 2019-06-06 NOTE — Progress Notes (Signed)
Physical Therapy Session Note  Patient Details  Name: Michael [name]. [name]: [id number] Date of Birth: [birth year]  Today's Date: 06/06/2019 PT Individual Time: 1415-1457 PT Individual Time Calculation (min): 42 min   Short Term Goals: Week 1:  PT Short Term Goal 1 (Week 1): STG=LTGs due to LOS.  Skilled Therapeutic Interventions/Progress Updates:   Received pt sitting EOB, pt agreeable to therapy, and denied any pain during session. Session focused on functional mobility/transfers, ambulation with and without AD, LE strength, dynamic standing balance/coordination, spatial awareness/perception, and improved endurance with activity. Pt ambulated 170ft with RW to therapy gym with supervision. Pt ambulated additional 144ft without AD CGA to hold up pants. Pt demonstrated narrow BOS, decreased stride length, and decreased arm swing with gait. Pt performed standing pipe construction x 2 trials with supervision. Pt required min verbal cues with simple pictures for size of pipes chosen to copy picture. Pt required mod verbal cues/assistance for more complex pictures. Pt demonstrated increased difficulty with spatial awareness/perception when constructing shapes. Pt was able to imitate the same shape however demonstrated difficulty imaitating the same size. Pt ambulated 166ft with RW supervision back to room. Concluded session with pt sitting EOB, needs within reach, and bed alarm on.   Therapy Documentation Precautions:  Precautions Precautions: Fall, Other (comment) Precaution Comments: recent CPR, but has no c/o pain. Restrictions Weight Bearing Restrictions: No   Therapy/Group: Individual Therapy Jacon Whetzel PT, DPT   06/06/2019, 7:52 AM

## 2019-06-07 ENCOUNTER — Inpatient Hospital Stay (HOSPITAL_COMMUNITY): Payer: Medicare Other

## 2019-06-07 ENCOUNTER — Inpatient Hospital Stay (HOSPITAL_COMMUNITY): Payer: Medicare Other | Admitting: Occupational Therapy

## 2019-06-07 ENCOUNTER — Inpatient Hospital Stay (HOSPITAL_COMMUNITY): Payer: Medicare Other | Admitting: Speech Pathology

## 2019-06-07 DIAGNOSIS — I471 Supraventricular tachycardia: Secondary | ICD-10-CM | POA: Diagnosis not present

## 2019-06-07 DIAGNOSIS — R002 Palpitations: Secondary | ICD-10-CM | POA: Diagnosis not present

## 2019-06-07 LAB — RENAL FUNCTION PANEL
Albumin: 2.6 g/dL — ABNORMAL LOW (ref 3.5–5.0)
Anion gap: 17 — ABNORMAL HIGH (ref 5–15)
BUN: 35 mg/dL — ABNORMAL HIGH (ref 8–23)
CO2: 25 mmol/L (ref 22–32)
Calcium: 9 mg/dL (ref 8.9–10.3)
Chloride: 97 mmol/L — ABNORMAL LOW (ref 98–111)
Creatinine, Ser: 10.69 mg/dL — ABNORMAL HIGH (ref 0.61–1.24)
GFR calc Af Amer: 5 mL/min — ABNORMAL LOW (ref >60)
GFR calc non Af Amer: 5 mL/min — ABNORMAL LOW (ref >60)
Glucose, Bld: 130 mg/dL — ABNORMAL HIGH (ref 70–99)
Phosphorus: 6 mg/dL — ABNORMAL HIGH (ref 2.5–4.6)
Potassium: 4 mmol/L (ref 3.5–5.1)
Sodium: 139 mmol/L (ref 135–145)

## 2019-06-07 LAB — CBC
HCT: 42.7 % (ref 39.0–52.0)
Hemoglobin: 13 g/dL (ref 13.0–17.0)
MCH: 25.3 pg — ABNORMAL LOW (ref 26.0–34.0)
MCHC: 30.4 g/dL (ref 30.0–36.0)
MCV: 83.2 fL (ref 80.0–100.0)
Platelets: 141 10*3/uL — ABNORMAL LOW (ref 150–400)
RBC: 5.13 MIL/uL (ref 4.22–5.81)
RDW: 18.6 % — ABNORMAL HIGH (ref 11.5–15.5)
WBC: 4.2 10*3/uL (ref 4.0–10.5)
nRBC: 0 % (ref 0.0–0.2)

## 2019-06-07 MED ORDER — ACETAMINOPHEN 325 MG PO TABS: 325.0000 mg | ORAL_TABLET | ORAL | Status: DC | PRN

## 2019-06-07 MED ORDER — ALTEPLASE 2 MG IJ SOLR
INTRAMUSCULAR | Status: AC
  Administered 2019-06-07: 4 mg via ARTERIOVENOUS_FISTULA
  Filled 2019-06-07: qty 4

## 2019-06-07 NOTE — Progress Notes (Signed)
Speech Language Pathology Daily Session Note  Patient Details  Name: Michael [name]. [name]: [id number] Date of Birth: [birth year]  Today's Date: 06/07/2019 SLP Individual Time: 1000-1045 SLP Individual Time Calculation (min): 45 min  Short Term Goals: Week 1: SLP Short Term Goal 1 (Week 1): STGs = LTGs d/t short ELOS  Skilled Therapeutic Interventions:  Skilled treatment session targeted cognition goals. SLP facilitated session by providing Max A faded to Mod A for intellectual awareness of physical and cognitive deficits. SLP then provided ways within the home that these deficits will pose a safety risk. Pt didn't agree with therapist. Wife to come in on 06/08/19 for education on recommendation for 24 hour supervision. Wife will be taking FMLA to help support pt. SLP also facilitated session by providing Min A to play previously learned card game. Specifically pt required cues for recall of rules. Pt left sitting edge of bed, bed alarm on and all needs in reach.      Pain Pain Assessment Pain Scale: 0-10 Pain Score: 0-No pain  Therapy/Group: Individual Therapy  Chardonnay Holzmann 06/07/2019, 10:48 AM

## 2019-06-07 NOTE — Progress Notes (Signed)
Subjective:  Feeling well today.  TDC not functioning well even reversed.    Objective Vital signs in last 24 hours: Vitals:   06/06/19 1929 06/07/19 0500 06/07/19 0540 06/07/19 1338  BP: 133/89  (!) 115/94 (!) 126/105  Pulse: 74  78 67  Resp: 19  19 18   Temp: 97.6 F (36.4 C)  98.5 F (36.9 C) 98.2 F (36.8 C)  TempSrc:      SpO2: 92%  100% 94%  Weight:  78.5 kg     Weight change: -2.2 kg  Physical Exam: General: at sink, energetic  NAD , OX3 Heart: irreg, no rub  Lungs: un labored  breathing , CTA  now Abdomen: BS pos , soft , nt, nd Extremities: Bilat. Lower extrem pedal edema trace Dialysis Access: R IJ  Perm Cath    Dialysis Orders: [facility] MWF 4.5h 400/800 79kg 2/2 bath TDC Hep 4000 - no esa active - last Hb 13.9 on 1/22 - parsabiv 2.5 mg 3 x/week - hectorol 2 mcg IV each tx  Problem/Plan: 1.  Cardiac Arrest - SP Cooling Protocol= / card / EP following  --> improved 2. ESRD - HD  MWF --> catheter not able to run today, tPA and bring back tomorrow.  If doesn't work will need cath exchange. 3. Acute Hypoxic Resp. Failure sp Vent = resolved  now on RA  And CIR 4. Nonischemic CM  EF  2 d echo yest "<20%" EP and Card following 5. HTN/volume -  bp stable, on Metoprol 12.5 mg bid  / CXR 1/27 - vasc congestion last hd  Looks ok now with post wt 79kg ? Gaining now that he's rehabbing 6. Anemia - HGB 12+  No esa needs  7. Secondary hyperparathyroidism -phos 10.4 > 6.2 binders  Ca corec 9.9  / Renvela/   as binders  , on Hec  2 mcg 8. Nutrition= Nepro / renal diet/  Renal vit  9. OSA   Jannifer Hick MD [facility] Pager [phone]  Labs: Basic Metabolic Panel: Recent Labs  Lab 06/02/19 0450 06/02/19 2006 06/04/19 0449 06/05/19 1346 06/07/19 1443  NA 137   < > 137 137 139  K 4.6   < > 4.2 4.4 4.0  CL 96*   < > 98 97* 97*  CO2 26   < > 26 24 25   GLUCOSE 90   < > 91 109* 130*  BUN 44*   < > 35* 46* 35*  CREATININE 9.71*   < > 9.67* 11.14* 10.69*   CALCIUM 9.1   < > 8.8* 9.3 9.0  PHOS 10.4*  --   --  6.2* 6.0*   < > = values in this interval not displayed.   Liver Function Tests: Recent Labs  Lab 06/01/19 0418 06/01/19 0418 06/02/19 0450 06/05/19 1346 06/07/19 1443  AST 38  --   --   --   --   ALT 6  --   --   --   --   ALKPHOS 50  --   --   --   --   BILITOT 1.5*  --   --   --   --   PROT 6.0*  --   --   --   --   ALBUMIN 2.8*   < > 2.6* 2.9* 2.6*   < > = values in this interval not displayed.   No results for input(s): LIPASE, AMYLASE in the last 168 hours. No results for input(s): AMMONIA  in the last 168 hours. CBC: Recent Labs  Lab 06/01/19 0500 06/01/19 0500 06/03/19 0552 06/03/19 0552 06/04/19 0449 06/05/19 1346 06/07/19 1443  WBC 6.1   < > 4.1   < > 3.9* 4.6 4.2  NEUTROABS  --   --   --   --   --  3.0  --   HGB 14.5   < > 12.2*   < > 12.9* 14.4 13.0  HCT 47.0   < > 41.2   < > 43.2 46.7 42.7  MCV 81.9  --  85.8  --  85.4 83.1 83.2  PLT 170   < > 135*   < > 128* 180 141*   < > = values in this interval not displayed.   Cardiac Enzymes: No results for input(s): CKTOTAL, CKMB, CKMBINDEX, TROPONINI in the last 168 hours. CBG: Recent Labs  Lab 06/01/19 0416 06/01/19 0657 06/01/19 0809 06/01/19 1150 06/01/19 1533  GLUCAP 73 79 81 85 92    Medications:  . acyclovir  400 mg Oral Q lunch  . alteplase      . alteplase      . aspirin EC  81 mg Oral Daily  . atorvastatin  40 mg Oral QHS  . Chlorhexidine Gluconate Cloth  6 each Topical Q0600  . dorzolamide-timolol  1 drop Both Eyes BID  . doxercalciferol  2 mcg Intravenous Q M,W,F-HD  . feeding supplement (NEPRO CARB STEADY)  237 mL Oral BID BM  . heparin  5,000 Units Subcutaneous Q8H  . metoprolol succinate  12.5 mg Oral Daily  . multivitamin  1 tablet Oral QHS  . pantoprazole  40 mg Oral Daily  . prednisoLONE acetate  1 drop Right Eye Q1200  . sevelamer carbonate  1,600 mg Oral TID WC

## 2019-06-07 NOTE — Progress Notes (Signed)
Occupational Therapy Session Note  Patient Details  Name: Michael [name]. [name]: [id number] Date of Birth: [birth year]  Today's Date: 06/07/2019 OT Individual Time: 4098-1191 OT Individual Time Calculation (min): 70 min    Short Term Goals: Week 1:  OT Short Term Goal 1 (Week 1): STG = LTGs due to ELOS  Skilled Therapeutic Interventions/Progress Updates:    Treatment session with focus on functional mobility, working memory, and memory strategies during therapeutic activities.  Pt received seated EOB reporting already dressed this AM.  Pt declined shower, stating it would not be safe d/t his HD cath in chest demonstrating good safety awareness (ok to shower if covered).  Pt donned shoes with setup assist.  Ambulated around rehab unit to locate 5 items.  Pt required min context clues to locate items as he was not familiar with some of the items.  Able to locate all 5 with min cues and use of written list to refer as pt unable to call all 5 items.  Pt ambulated without AD supervision throughout unit.  Engaged in pipe tree puzzle in standing on foam wedge to challenge balance.  Pt able to complete first pattern quickly as it was a symmetrical pattern, pt required increased cues with 2nd pattern due to increased complexity.  Pt demonstrating improved working memory.  Completed car transfer with supervision to both car height and SUV height.  Educated on not cleared to drive upon d/c with pt expressing understanding.  Of note, pt did not recall some activies he had done on W[place] and did not recall ever having met MD despite seeing him the past 3 days.  Therapy Documentation Precautions:  Precautions Precautions: Fall, Other (comment) Precaution Comments: recent CPR, but has no c/o pain. Restrictions Weight Bearing Restrictions: No General:   Vital Signs: Therapy Vitals Temp: 98.5 F (36.9 C) Pulse Rate: 78 Resp: 19 BP: (!) 115/94 Patient Position (if appropriate): Lying Oxygen  Therapy SpO2: 100 % O2 Device: Room Air Pain:  Pt with no c/o pain   Therapy/Group: Individual Therapy  Simonne Come 06/07/2019, 8:40 AM

## 2019-06-07 NOTE — Progress Notes (Signed)
Physical Therapy Session Note  Patient Details  Name: Michael [name]. [name]: [id number] Date of Birth: 1955-08-26  Today's Date: 06/07/2019 PT Individual Time: 6468-0321 PT Individual Time Calculation (min): 74 min   Short Term Goals: Week 1:  PT Short Term Goal 1 (Week 1): STG=LTGs due to LOS.  Skilled Therapeutic Interventions/Progress Updates:   Received pt supine in bed, pt agreeable to therapy, and denied any pain during session. Session focused on functional mobility/transfers, ambulation, LE strength, dynamic standing balance/coordination, and improved endurance with activity. Pt performed bed mobility independently. Pt ambulated 149ft to therapy gym with [facility] supervision.   On Biodex pt performed the following activities: -Postural stability training for 1 minute 20 seconds on level 12 without UE support supervision.  -Weight shift training for 2 minutes 30 seconds on level 12 with and without UE support supervision.  -Limits of stability training without UE support on level static for 1 minute and level 10 for 1 minute.  -Catch baseball on level hard without UE support with supervision.   Pt ambulated 157ft with SPC with supervision to stairwell. Pt navigated 11 steps with 2 rails and 11 steps with 1 rail with supervision ascending and descending with a step to pattern. Pt ambulated additional 170ft and performed ambulatory car transfer from SUV height with SPC and supervision. Pt ambulated 64ft on uneven surfaces (ramp) with SPC and supervision. Pt ambulated 170ft with SPC supervision to dayroom. Pt performed bilateral LE strengthening on Nustep at workload 6 for 4 minutes and at workload 4 for 4 minutes. Pt required multiple rest breaks in between due to increased fatigue. Pt totaled 244 steps. Pt stated "I can sure feel that in my legs and all the way up my body". Pt reported 8/10 difficulty with activity. Pt performed bilateral standing LE strengthening on Kinetron at 10cms/sec for 1  minute x 2 trials with UE support and supervision and LE strengthening with 1 UE support at 20cm/sec 1 minute x 2 trials. Pt fatigued after activity and required rest break. Pt performed seated bilateral hamstring stretch using towel for increased DF 1 x 30 second hold. Pt ambulated 166ft with SPC back to room with supervision. Pt stood at sink and washed hands, upon turning to walk to chair pt lost balance towards R and required mod A to correct. Therapist educated pt on safety awareness and use of AD. Concluded session with pt sitting in chair, needs within reach, and chair pad alarm on. Pt in agreement not to get up without assistance.   Therapy Documentation Precautions:  Precautions Precautions: Fall, Other (comment) Precaution Comments: recent CPR, but has no c/o pain. Restrictions Weight Bearing Restrictions: No   Therapy/Group: Individual Therapy Ramces Shomaker PT, DPT   06/07/2019, 7:37 AM

## 2019-06-07 NOTE — Progress Notes (Signed)
[place] PHYSICAL MEDICINE & REHABILITATION PROGRESS NOTE  Subjective/Complaints: Patient seen sitting up working with therapy this morning.  He states he slept well overnight.  He is alert and oriented, but still does not recall me from yesterday.  ROS: Denies CP, SOB, N/V/D.  Objective: Vital Signs: Blood pressure (!) 115/94, pulse 78, temperature 98.5 F (36.9 C), resp. rate 19, weight 78.5 kg, SpO2 100 %. No results found. Recent Labs    06/05/19 1346  WBC 4.6  HGB 14.4  HCT 46.7  PLT 180   Recent Labs    06/05/19 1346  NA 137  K 4.4  CL 97*  CO2 24  GLUCOSE 109*  BUN 46*  CREATININE 11.14*  CALCIUM 9.3    Physical Exam: BP (!) 115/94 (BP Location: Right Leg)   Pulse 78   Temp 98.5 F (36.9 C)   Resp 19   Wt 78.5 kg   SpO2 100%   BMI 27.93 kg/m  Constitutional: No distress . Vital signs reviewed. HENT: Normocephalic.  Atraumatic. Eyes: EOMI. No discharge. Cardiovascular: No JVD. Respiratory: Normal effort.  No stridor. GI: Non-distended. Skin: Warm and dry.  Intact. Psych: Normal mood.  Normal behavior. Musc: Lower extremity edema Neuro: Alert and oriented x4 Motor: Grossly 4+/5 throughout, stable  Assessment/Plan: 1. Functional deficits secondary to anoxic BI which require 3+ hours per day of interdisciplinary therapy in a comprehensive inpatient rehab setting.  Physiatrist is providing close team supervision and 24 hour management of active medical problems listed below.  Physiatrist and rehab team continue to assess barriers to discharge/monitor patient progress toward functional and medical goals  Care Tool:  Bathing    Body parts bathed by patient: Right arm, Left arm, Chest, Abdomen, Front perineal area, Buttocks, Right upper leg, Left upper leg, Face         Bathing assist Assist Level: Supervision/Verbal cueing     Upper Body Dressing/Undressing Upper body dressing   What is the patient wearing?: Pull over shirt    Upper body  assist Assist Level: Contact Guard/Touching assist    Lower Body Dressing/Undressing Lower body dressing      What is the patient wearing?: Pants     Lower body assist Assist for lower body dressing: Minimal Assistance - Patient > 75%     Toileting Toileting    Toileting assist Assist for toileting: Independent Assistive Device Comment: urinal   Transfers Chair/bed transfer  Transfers assist     Chair/bed transfer assist level: Contact Guard/Touching assist     Locomotion Ambulation   Ambulation assist      Assist level: Supervision/Verbal cueing Assistive device: Walker-rolling Max distance: 173ft   Walk 10 feet activity   Assist     Assist level: Supervision/Verbal cueing Assistive device: Walker-rolling   Walk 50 feet activity   Assist    Assist level: Supervision/Verbal cueing Assistive device: Walker-rolling    Walk 150 feet activity   Assist Walk 150 feet activity did not occur: Safety/medical concerns(increased meandering gait.)  Assist level: Supervision/Verbal cueing Assistive device: Walker-rolling    Walk 10 feet on uneven surface  activity   Assist Walk 10 feet on uneven surfaces activity did not occur: Safety/medical concerns   Assist level: Contact Guard/Touching assist     Wheelchair     Assist Will patient use wheelchair at discharge?: Yes(LT goals set ) Type of Wheelchair: Manual    Wheelchair assist level: Supervision/Verbal cueing Max wheelchair distance: 21'    Wheelchair 50 feet with 2  turns activity    Assist        Assist Level: Minimal Assistance - Patient > 75%   Wheelchair 150 feet activity     Assist     Assist Level: Maximal Assistance - Patient 25 - 49%      Medical Problem List and Plan: 1.  Impaired mobility and ADLs secondary to anoxic BI.  Continue CIR 2.  Antithrombotics: -DVT/anticoagulation:  Pharmaceutical: Coumadin and Heparin             -antiplatelet therapy:  ASA 3. Pain Management: Tylenol prn 4. Mood: LCSW to follow for evaluation and support.              -antipsychotic agents: N/A 5. Neuropsych: This patient is not fully capable of making decisions on his own behalf. Impulsive, requires safety cues.  6. Skin/Wound Care: routine pressure relief measures.  7. Fluids/Electrolytes/Nutrition: Strict I/O. Lytes with HD 8. ESRD: HD MWF at the end of the day to help with tolerance of therapy during the day.  9. H/o VT/ A flutter/NICM: Low dose BB added.   Relatively controlled on 2/5  Monitor with increased mobility. 10. Anemia of chronic disease:  Monitor H/H.             Hemoglobin 14.4 on 2/3, likely concentrated component, await labs today  Cont to monitor  11. Thrombocytopenia:  Platelets 180 on 2/3, likely concentrated component, await labs today 12. Retinopathy/Glaucoma?: Continue Cosopt bid with pred forte in right eye.  13. Leukopenia  WBCs 4.6 on 2/3  Continue to monitor  LOS: 3 days A FACE TO FACE EVALUATION WAS PERFORMED  Shadow Schedler Lorie Phenix 06/07/2019, 8:27 AM

## 2019-06-07 NOTE — Progress Notes (Signed)
Social Work Patient ID: Michael Humphrey., male   DOB: Mar 15, 1956, 64 y.o.   [name]: [id number]    SW spoke with pt wife Michael [name] to inform on FMLA forms will be placed in husband's room, and as a section requires her signature before form can be faxed. Wife intends to come to hospital tomorrow for family education. SW explained will fax forms on Monday once she signs form.  Loralee Pacas, MSW, Social Worker Office (preferred): [phone] Cell: [phone] Fax: [phone]

## 2019-06-08 ENCOUNTER — Encounter (HOSPITAL_COMMUNITY): Payer: Medicare Other | Admitting: Speech Pathology

## 2019-06-08 ENCOUNTER — Inpatient Hospital Stay (HOSPITAL_COMMUNITY): Payer: Medicare Other | Admitting: Speech Pathology

## 2019-06-08 ENCOUNTER — Inpatient Hospital Stay (HOSPITAL_COMMUNITY): Payer: Medicare Other | Admitting: Physical Therapy

## 2019-06-08 ENCOUNTER — Ambulatory Visit (HOSPITAL_COMMUNITY): Payer: Medicare Other | Admitting: Physical Therapy

## 2019-06-08 ENCOUNTER — Encounter (HOSPITAL_COMMUNITY): Payer: Medicare Other | Admitting: Occupational Therapy

## 2019-06-08 LAB — CBC
HCT: 41.9 % (ref 39.0–52.0)
Hemoglobin: 12.9 g/dL — ABNORMAL LOW (ref 13.0–17.0)
MCH: 25.6 pg — ABNORMAL LOW (ref 26.0–34.0)
MCHC: 30.8 g/dL (ref 30.0–36.0)
MCV: 83.3 fL (ref 80.0–100.0)
Platelets: 152 10*3/uL (ref 150–400)
RBC: 5.03 MIL/uL (ref 4.22–5.81)
RDW: 19.1 % — ABNORMAL HIGH (ref 11.5–15.5)
WBC: 3.9 10*3/uL — ABNORMAL LOW (ref 4.0–10.5)
nRBC: 0.5 % — ABNORMAL HIGH (ref 0.0–0.2)

## 2019-06-08 LAB — RENAL FUNCTION PANEL
Albumin: 2.6 g/dL — ABNORMAL LOW (ref 3.5–5.0)
Anion gap: 16 — ABNORMAL HIGH (ref 5–15)
BUN: 41 mg/dL — ABNORMAL HIGH (ref 8–23)
CO2: 25 mmol/L (ref 22–32)
Calcium: 9 mg/dL (ref 8.9–10.3)
Chloride: 96 mmol/L — ABNORMAL LOW (ref 98–111)
Creatinine, Ser: 11.78 mg/dL — ABNORMAL HIGH (ref 0.61–1.24)
GFR calc Af Amer: 5 mL/min — ABNORMAL LOW (ref >60)
GFR calc non Af Amer: 4 mL/min — ABNORMAL LOW (ref >60)
Glucose, Bld: 98 mg/dL (ref 70–99)
Phosphorus: 6.7 mg/dL — ABNORMAL HIGH (ref 2.5–4.6)
Potassium: 4.1 mmol/L (ref 3.5–5.1)
Sodium: 137 mmol/L (ref 135–145)

## 2019-06-08 MED ORDER — DOXERCALCIFEROL 4 MCG/2ML IV SOLN
INTRAVENOUS | Status: AC
  Filled 2019-06-08: qty 2

## 2019-06-08 MED ORDER — HEPARIN SODIUM (PORCINE) 1000 UNIT/ML IJ SOLN
INTRAMUSCULAR | Status: AC
  Administered 2019-06-08: 3800 [IU] via ARTERIOVENOUS_FISTULA
  Filled 2019-06-08: qty 4

## 2019-06-08 NOTE — Progress Notes (Signed)
SLP Cancellation Note  Patient Details Name: Michael [name]. [name]: [id number] DOB: [birth year]   Cancelled treatment:        Pt off the floor for dialysis at time of scheduled ST family education.  Wife was present in room and SLP discussed team's recommendations of 24/7 supervision at discharge in addition to assistance for medication and financial management due to ongoing cognitive changes.  Currently, pt's wife feels that pt is 80-90% back to baseline in regards to his memory.  SLP provided skilled education regarding memory compensatory strategies and gave pt's wife a handout to maximize carryover in the home environment.  Pt's wife has gone to great lengths to educate herself on cognitive dysfunction and has also recruited the help of multiple family members to manage pt at home.  All questions were answered to her satisfaction at this time.                                                                                                  Arrayah Connors, Selinda Orion 06/08/2019, 12:56 PM

## 2019-06-08 NOTE — Progress Notes (Signed)
Physical Therapy Note  Patient Details  Name: Michael [name]. [name]: [id number] Date of Birth: 13-Jul-1955 Today's Date: 06/08/2019    Attempted to see pt for family edu session but nurse reports pt was off unit for dialysis 2/2 missing appointment yesterday. PT spoke with pt's wife & educated her on pt's need for supervision & use of SPC at d/c, as well as pt's impulsivity. Pt's wife reports she is aware of pt's impaired awareness & memory & she reports she feels comfortable assisting pt at d/c based on what she saw in prior OT session. Encouraged pt to contact LCSW if she wished to participate in PT session prior to pt's d/c. Educated her on need to ensure clear pathways at home with wife reporting no rugs & all hardwood floors & she is aware of pt's need to wear shoes or non skid socks.   Michael [name] 06/08/2019, 11:07 AM

## 2019-06-08 NOTE — Progress Notes (Signed)
[place] PHYSICAL MEDICINE & REHABILITATION PROGRESS NOTE  Subjective/Complaints:   Pt appropriate and polite, but all he could remember was that he's waiting for HD_ said will sleep until time to go for dialysis.   Then rolled back over and covered face with blanket.   ROS: Denies CP, SOB, N/V/D.  Objective: Vital Signs: Blood pressure (!) 161/70, pulse 65, temperature (!) 97.5 F (36.4 C), temperature source Oral, resp. rate 16, weight 80.3 kg, SpO2 96 %. No results found. Recent Labs    06/07/19 1443 06/08/19 1138  WBC 4.2 3.9*  HGB 13.0 12.9*  HCT 42.7 41.9  PLT 141* 152   Recent Labs    06/07/19 1443 06/08/19 1139  NA 139 137  K 4.0 4.1  CL 97* 96*  CO2 25 25  GLUCOSE 130* 98  BUN 35* 41*  CREATININE 10.69* 11.78*  CALCIUM 9.0 9.0    Physical Exam: BP (!) 161/70   Pulse 65   Temp (!) 97.5 F (36.4 C) (Oral)   Resp 16   Wt 80.3 kg   SpO2 96%   BMI 28.57 kg/m  Constitutional: No distress . Vital signs reviewed. HENT: Normocephalic.  Atraumatic. Eyes: EOMI. No discharge. Cardiovascular: No JVD. Respiratory: Normal effort.  No stridor. GI: Non-distended. Skin: Warm and dry.  Intact. Psych: Normal mood.  Normal behavior. Musc: Lower extremity edema; RUE at unusual angle this AM Neuro: Alert and oriented x4 Motor: Grossly 4+/5 throughout, stable  Assessment/Plan: 1. Functional deficits secondary to anoxic BI which require 3+ hours per day of interdisciplinary therapy in a comprehensive inpatient rehab setting.  Physiatrist is providing close team supervision and 24 hour management of active medical problems listed below.  Physiatrist and rehab team continue to assess barriers to discharge/monitor patient progress toward functional and medical goals  Care Tool:  Bathing    Body parts bathed by patient: Right arm, Left arm, Chest, Abdomen, Front perineal area, Buttocks, Right upper leg, Left upper leg, Face         Bathing assist Assist  Level: Supervision/Verbal cueing     Upper Body Dressing/Undressing Upper body dressing   What is the patient wearing?: Pull over shirt    Upper body assist Assist Level: Contact Guard/Touching assist    Lower Body Dressing/Undressing Lower body dressing      What is the patient wearing?: Pants     Lower body assist Assist for lower body dressing: Minimal Assistance - Patient > 75%     Toileting Toileting    Toileting assist Assist for toileting: Independent Assistive Device Comment: urinal   Transfers Chair/bed transfer  Transfers assist     Chair/bed transfer assist level: Supervision/Verbal cueing     Locomotion Ambulation   Ambulation assist      Assist level: Supervision/Verbal cueing Assistive device: Cane-straight Max distance: 120ft   Walk 10 feet activity   Assist     Assist level: Supervision/Verbal cueing Assistive device: Cane-straight   Walk 50 feet activity   Assist    Assist level: Supervision/Verbal cueing Assistive device: Cane-straight    Walk 150 feet activity   Assist Walk 150 feet activity did not occur: Safety/medical concerns(increased meandering gait.)  Assist level: Supervision/Verbal cueing Assistive device: Cane-straight    Walk 10 feet on uneven surface  activity   Assist Walk 10 feet on uneven surfaces activity did not occur: Safety/medical concerns   Assist level: Supervision/Verbal cueing Assistive device: Government social research officer Will patient use  wheelchair at discharge?: No Type of Wheelchair: Manual    Wheelchair assist level: Supervision/Verbal cueing Max wheelchair distance: 63'    Wheelchair 50 feet with 2 turns activity    Assist        Assist Level: Minimal Assistance - Patient > 75%   Wheelchair 150 feet activity     Assist     Assist Level: Maximal Assistance - Patient 25 - 49%      Medical Problem List and Plan: 1.  Impaired mobility and ADLs  secondary to anoxic BI.  Continue CIR 2.  Antithrombotics: -DVT/anticoagulation:  Pharmaceutical: Coumadin and Heparin             -antiplatelet therapy: ASA 3. Pain Management: Tylenol prn 4. Mood: LCSW to follow for evaluation and support.              -antipsychotic agents: N/A 5. Neuropsych: This patient is not fully capable of making decisions on his own behalf. Impulsive, requires safety cues.  6. Skin/Wound Care: routine pressure relief measures.  7. Fluids/Electrolytes/Nutrition: Strict I/O. Lytes with HD 8. ESRD: HD MWF at the end of the day to help with tolerance of therapy during the day.  9. H/o VT/ A flutter/NICM: Low dose BB added.   Relatively controlled on 2/5  Monitor with increased mobility. 10. Anemia of chronic disease:  Monitor H/H.             Hemoglobin 14.4 on 2/3, likely concentrated component, await labs today  12.9 on 2/6- con't regimen  Cont to monitor  11. Thrombocytopenia:  Platelets 180 on 2/3, likely concentrated component, await labs today  Plts 152k on 2/6- con't regimen 12. Retinopathy/Glaucoma?: Continue Cosopt bid with pred forte in right eye.  13. Leukopenia  WBCs 4.6 on 2/3  WBC 3.9k on 2/6  Continue to monitor  LOS: 4 days A FACE TO FACE EVALUATION WAS PERFORMED  Michael [name] 06/08/2019, 12:12 PM

## 2019-06-08 NOTE — Progress Notes (Signed)
Occupational Therapy Session Note  Patient Details  Name: Michael [name]. [name]: [id number] Date of Birth: [birth year]  Today's Date: 06/08/2019 OT Individual Time: 1000-1041 OT Individual Time Calculation (min): 41 min   Short Term Goals: Week 1:  OT Short Term Goal 1 (Week 1): STG = LTGs due to ELOS     Skilled Therapeutic Interventions/Progress Updates:    Pt greeted while standing at the sink shaving. Spouse present for family education, providing CGA for standing. Discussed their d/c plans for home with pt and spouse verbalizing that he will sponge bathe vs shower to minimize infection risk with HD catheter. Educated spouse [name] that if this is a future goal post d/c to plan with f/u therapies accordingly so he can get a suitable seat for shower. Pt used SPC to ambulate down hallway with [name] providing close supervision assist. Discussed pts need for 24/7 supervision during functional tasks and mobility with spouse exhibiting a very good understanding of pts cognitive deficits and functional implications at home. Pt transferred to couch and also to recliner. Discussed using small hand weights or soup cans for therapeutic UE exercise at home to remain active. Returned to room and trained spouse on toilet and bed transfers, cleared her on safety plan to assist pt with these transfers. At end of session pt returned to bed for dialysis and was taken off unit for HD. Spouse very receptive to and grateful for education provided today. No further questions for OT.   Therapy Documentation Precautions:  Precautions Precautions: Fall, Other (comment) Precaution Comments: recent CPR, but has no c/o pain. Restrictions Weight Bearing Restrictions: No Vital Signs: Therapy Vitals Temp: (!) 97.5 F (36.4 C) Temp Source: Oral Pulse Rate: 65 Resp: 16 BP: (!) 161/70 Patient Position (if appropriate): Lying Oxygen Therapy SpO2: 96 % O2 Device: Room Air Pain: No c/o pain during  tx ADL: ADL Grooming: Contact guard Where Assessed-Grooming: Standing at sink Upper Body Bathing: Supervision/safety Where Assessed-Upper Body Bathing: Shower Lower Body Bathing: Supervision/safety Where Assessed-Lower Body Bathing: Shower Upper Body Dressing: Contact guard Where Assessed-Upper Body Dressing: Standing at sink Lower Body Dressing: Minimal assistance Where Assessed-Lower Body Dressing: Standing at sink Toilet Transfer: Minimal assistance Toilet Transfer Method: Wellsite geologist Transfer: Minimal assistance Social research officer, government Method: Heritage manager: Shower seat with back, Grab bars      Therapy/Group: Individual Therapy  Abubakr Wieman A Desirae Mancusi 06/08/2019, 12:45 PM

## 2019-06-08 NOTE — Progress Notes (Signed)
Physical Therapy Session Note  Patient Details  Name: Michael [name]. [name]: [id number] Date of Birth: May 07, 1955  Today's Date: 06/08/2019     Short Term Goals: Week 1:  PT Short Term Goal 1 (Week 1): STG=LTGs due to LOS.  Skilled Therapeutic Interventions/Progress Updates:   Pt off unit for dialysis. Will re-attempt at later time/date, provided pt is medically appropriate for therapy.         Therapy Documentation Precautions:  Precautions Precautions: Fall, Other (comment) Precaution Comments: recent CPR, but has no c/o pain. Restrictions Weight Bearing Restrictions: No Vital Signs: Therapy Vitals Temp: 98 F (36.7 C) Temp Source: Oral Pulse Rate: 71 Resp: 18 BP: (!) 157/109 Patient Position (if appropriate): Sitting Oxygen Therapy SpO2: 100 % O2 Device: Room Air Pain: Pain Assessment Pain Scale: 0-10 Pain Score: 0-No pain    Therapy/Group: Individual Therapy  Lorie Phenix 06/08/2019, 3:22 PM

## 2019-06-08 NOTE — Progress Notes (Signed)
Subjective:  Ambulating with cane in hall feeling well this AM.  Awaiting HD.   Objective Vital signs in last 24 hours: Vitals:   06/07/19 1408 06/07/19 1411 06/07/19 1919 06/08/19 0606  BP: 133/71 (!) 142/79 (!) 136/91 123/71  Pulse: 83 82 77 67  Resp: 18  19 19   Temp: 98.2 F (36.8 C)  98.2 F (36.8 C) 97.6 F (36.4 C)  TempSrc: Oral     SpO2: 96%  98% 97%  Weight: 79.1 kg   78.3 kg   Weight change: 0.6 kg  Physical Exam: General: energetic  NAD Heart: irreg, no rub  Lungs: un labored  breathing , CTA  now Extremities: Bilat. Lower extrem pedal edema trace Dialysis Access: R IJ  Perm Cath    Dialysis Orders: [facility] MWF 4.5h 400/800 79kg 2/2 bath TDC Hep 4000 - no esa active - last Hb 13.9 on 1/22 - parsabiv 2.5 mg 3 x/week - hectorol 2 mcg IV each tx  Problem/Plan: 1.  Cardiac Arrest - SP Cooling Protocol= / card / EP following  --> improved 2. ESRD - HD  MWF --> catheter not able to run yesterday, tPA'd and try to use today.  If doesn't work will need cath exchange. 3. Acute Hypoxic Resp. Failure sp Vent = resolved  now on RA  And CIR 4. Nonischemic CM  EF  2 d echo yest "<20%" EP and Card following 5. HTN/volume -  bp stable, on Metoprol 12.5 mg bid  / CXR 1/27 - vasc congestion now improved.  UF 2L today as tol, standing wt post.  6. Anemia - HGB 12+  No esa needs  7. Secondary hyperparathyroidism -phos 10.4 > 6.2 binders  Ca corec 9.9  / Renvela/   as binders  , on Hec  2 mcg 8. Nutrition= Nepro / renal diet/  Renal vit  9. OSA   Jannifer Hick MD [facility] Pager [phone]  Labs: Basic Metabolic Panel: Recent Labs  Lab 06/02/19 0450 06/02/19 2006 06/04/19 0449 06/05/19 1346 06/07/19 1443  NA 137   < > 137 137 139  K 4.6   < > 4.2 4.4 4.0  CL 96*   < > 98 97* 97*  CO2 26   < > 26 24 25   GLUCOSE 90   < > 91 109* 130*  BUN 44*   < > 35* 46* 35*  CREATININE 9.71*   < > 9.67* 11.14* 10.69*  CALCIUM 9.1   < > 8.8* 9.3 9.0  PHOS 10.4*   --   --  6.2* 6.0*   < > = values in this interval not displayed.   Liver Function Tests: Recent Labs  Lab 06/02/19 0450 06/05/19 1346 06/07/19 1443  ALBUMIN 2.6* 2.9* 2.6*   No results for input(s): LIPASE, AMYLASE in the last 168 hours. No results for input(s): AMMONIA in the last 168 hours. CBC: Recent Labs  Lab 06/03/19 0552 06/03/19 0552 06/04/19 0449 06/05/19 1346 06/07/19 1443  WBC 4.1   < > 3.9* 4.6 4.2  NEUTROABS  --   --   --  3.0  --   HGB 12.2*   < > 12.9* 14.4 13.0  HCT 41.2   < > 43.2 46.7 42.7  MCV 85.8  --  85.4 83.1 83.2  PLT 135*   < > 128* 180 141*   < > = values in this interval not displayed.   Cardiac Enzymes: No results for input(s): CKTOTAL, CKMB, CKMBINDEX, TROPONINI in  the last 168 hours. CBG: Recent Labs  Lab 06/01/19 1150 06/01/19 1533  GLUCAP 85 92    Medications:  . acyclovir  400 mg Oral Q lunch  . aspirin EC  81 mg Oral Daily  . atorvastatin  40 mg Oral QHS  . Chlorhexidine Gluconate Cloth  6 each Topical Q0600  . dorzolamide-timolol  1 drop Both Eyes BID  . doxercalciferol  2 mcg Intravenous Q M,W,F-HD  . feeding supplement (NEPRO CARB STEADY)  237 mL Oral BID BM  . heparin  5,000 Units Subcutaneous Q8H  . metoprolol succinate  12.5 mg Oral Daily  . multivitamin  1 tablet Oral QHS  . pantoprazole  40 mg Oral Daily  . prednisoLONE acetate  1 drop Right Eye Q1200  . sevelamer carbonate  1,600 mg Oral TID WC

## 2019-06-09 ENCOUNTER — Inpatient Hospital Stay (HOSPITAL_COMMUNITY): Payer: Medicare Other

## 2019-06-09 NOTE — Progress Notes (Signed)
[place] PHYSICAL MEDICINE & REHABILITATION PROGRESS NOTE  Subjective/Complaints:   Pt reports she has no issues, but wants to go home- watching pre-Superbowl shows on TV  ROS: Denies CP, SOB, N/V/D.  Objective: Vital Signs: Blood pressure (!) 134/92, pulse 67, temperature 97.8 F (36.6 C), temperature source Oral, resp. rate 20, weight 80.1 kg, SpO2 100 %. No results found. Recent Labs    06/07/19 1443 06/08/19 1138  WBC 4.2 3.9*  HGB 13.0 12.9*  HCT 42.7 41.9  PLT 141* 152   Recent Labs    06/07/19 1443 06/08/19 1139  NA 139 137  K 4.0 4.1  CL 97* 96*  CO2 25 25  GLUCOSE 130* 98  BUN 35* 41*  CREATININE 10.69* 11.78*  CALCIUM 9.0 9.0    Physical Exam: BP (!) 134/92 (BP Location: Right Arm)   Pulse 67   Temp 97.8 F (36.6 C) (Oral)   Resp 20   Wt 80.1 kg   SpO2 100%   BMI 28.50 kg/m  Constitutional: No distress . Vital signs reviewed. Awake, alert, appropriate, but focused on TV, not examiner; NAD HENT: Normocephalic.  Atraumatic. Eyes: EOMI. No discharge. Cardiovascular: No JVD. Respiratory: Normal effort.  No stridor. GI: Non-distended. Skin: Warm and dry.  Intact. Psych: Normal mood.  Normal behavior. Musc: Lower extremity edema 2+ Neuro: Alert and oriented x4 Motor: Grossly 4+/5 throughout, stable  Assessment/Plan: 1. Functional deficits secondary to anoxic BI which require 3+ hours per day of interdisciplinary therapy in a comprehensive inpatient rehab setting.  Physiatrist is providing close team supervision and 24 hour management of active medical problems listed below.  Physiatrist and rehab team continue to assess barriers to discharge/monitor patient progress toward functional and medical goals  Care Tool:  Bathing    Body parts bathed by patient: Right arm, Left arm, Chest, Abdomen, Front perineal area, Buttocks, Right upper leg, Left upper leg, Face         Bathing assist Assist Level: Supervision/Verbal cueing     Upper  Body Dressing/Undressing Upper body dressing   What is the patient wearing?: Pull over shirt    Upper body assist Assist Level: Contact Guard/Touching assist    Lower Body Dressing/Undressing Lower body dressing      What is the patient wearing?: Pants     Lower body assist Assist for lower body dressing: Minimal Assistance - Patient > 75%     Toileting Toileting    Toileting assist Assist for toileting: Independent Assistive Device Comment: urinal   Transfers Chair/bed transfer  Transfers assist     Chair/bed transfer assist level: Supervision/Verbal cueing     Locomotion Ambulation   Ambulation assist      Assist level: Supervision/Verbal cueing Assistive device: Cane-straight Max distance: 185ft   Walk 10 feet activity   Assist     Assist level: Supervision/Verbal cueing Assistive device: Cane-straight   Walk 50 feet activity   Assist    Assist level: Supervision/Verbal cueing Assistive device: Cane-straight    Walk 150 feet activity   Assist Walk 150 feet activity did not occur: Safety/medical concerns(increased meandering gait.)  Assist level: Supervision/Verbal cueing Assistive device: Cane-straight    Walk 10 feet on uneven surface  activity   Assist Walk 10 feet on uneven surfaces activity did not occur: Safety/medical concerns   Assist level: Supervision/Verbal cueing Assistive device: Hospital doctor     Assist Will patient use wheelchair at discharge?: No Type of Wheelchair: Manual    Wheelchair assist  level: Supervision/Verbal cueing Max wheelchair distance: 38'    Wheelchair 50 feet with 2 turns activity    Assist        Assist Level: Minimal Assistance - Patient > 75%   Wheelchair 150 feet activity     Assist     Assist Level: Maximal Assistance - Patient 25 - 49%      Medical Problem List and Plan: 1.  Impaired mobility and ADLs secondary to anoxic BI.  Continue CIR 2.   Antithrombotics: -DVT/anticoagulation:  Pharmaceutical: Coumadin and Heparin             -antiplatelet therapy: ASA 3. Pain Management: Tylenol prn 4. Mood: LCSW to follow for evaluation and support.              -antipsychotic agents: N/A 5. Neuropsych: This patient is not fully capable of making decisions on his own behalf. Impulsive, requires safety cues.  6. Skin/Wound Care: routine pressure relief measures.  7. Fluids/Electrolytes/Nutrition: Strict I/O. Lytes with HD 8. ESRD: HD MWF at the end of the day to help with tolerance of therapy during the day.  9. H/o VT/ A flutter/NICM: Low dose BB added.   Relatively controlled on 2/7  Monitor with increased mobility. 10. Anemia of chronic disease:  Monitor H/H.             Hemoglobin 14.4 on 2/3, likely concentrated component, await labs today  12.9 on 2/6- con't regimen  Cont to monitor  11. Thrombocytopenia:  Platelets 180 on 2/3, likely concentrated component, await labs today  Plts 152k on 2/6- con't regimen 12. Retinopathy/Glaucoma?: Continue Cosopt bid with pred forte in right eye.  13. Leukopenia  WBCs 4.6 on 2/3  WBC 3.9k on 2/6  Continue to monitor 14. Dispo  2/7- pt asking to go home; explained not possible quite yet.   LOS: 5 days A FACE TO FACE EVALUATION WAS PERFORMED  Kingsten Enfield 06/09/2019, 1:03 PM

## 2019-06-09 NOTE — Progress Notes (Signed)
Subjective:  No new issues.  HD catheter ran fine after tPA.    Objective Vital signs in last 24 hours: Vitals:   06/08/19 1524 06/08/19 1940 06/09/19 0450 06/09/19 0500  BP: (!) 117/91 103/63 (!) 134/92   Pulse: 65 (!) 58 67   Resp:  19 20   Temp:  98.4 F (36.9 C) 97.8 F (36.6 C)   TempSrc:   Oral   SpO2:  99% 100%   Weight:    80.1 kg   Weight change: 1.2 kg  Physical Exam: General: resting quietly  Lungs: un labored  breathing , CTA  now Extremities: Bilat. Lower extrem pedal edema trace Dialysis Access: R IJ  Perm Cath    Dialysis Orders: [facility] MWF 4.5h 400/800 79kg 2/2 bath TDC Hep 4000 - no esa active - last Hb 13.9 on 1/22 - parsabiv 2.5 mg 3 x/week - hectorol 2 mcg IV each tx  Problem/Plan: 1.  Cardiac Arrest - SP Cooling Protocol= / card / EP following  --> improved 2. ESRD - HD  MWF.  Ran 2/6 due to catheter issue 2/5, but will resume MWF schedule tomorrow.  3. Acute Hypoxic Resp. Failure sp Vent = resolved  now on RA  And CIR 4. Nonischemic CM  EF  2 d echo yest "<20%" EP and Card following 5. HTN/volume -  bp stable, on Metoprol 12.5 mg bid  / CXR 1/27 - vasc congestion now improved.  UF 2L tomorrow as tol, standing wt post.  6. Anemia - HGB 12+  No esa needs  7. Secondary hyperparathyroidism -phos 10.4 > 6.2 binders  Ca corec 9.9  / Renvela/   as binders  , on Hec  2 mcg 8. Nutrition= Nepro / renal diet/  Renal vit  9. OSA  10. Dispo: anticipate d/c soon  Jannifer Hick MD [facility] Pager [phone]  Labs: Basic Metabolic Panel: Recent Labs  Lab 06/05/19 1346 06/07/19 1443 06/08/19 1139  NA 137 139 137  K 4.4 4.0 4.1  CL 97* 97* 96*  CO2 24 25 25   GLUCOSE 109* 130* 98  BUN 46* 35* 41*  CREATININE 11.14* 10.69* 11.78*  CALCIUM 9.3 9.0 9.0  PHOS 6.2* 6.0* 6.7*   Liver Function Tests: Recent Labs  Lab 06/05/19 1346 06/07/19 1443 06/08/19 1139  ALBUMIN 2.9* 2.6* 2.6*   No results for input(s): LIPASE, AMYLASE in the  last 168 hours. No results for input(s): AMMONIA in the last 168 hours. CBC: Recent Labs  Lab 06/03/19 0552 06/03/19 0552 06/04/19 0449 06/04/19 0449 06/05/19 1346 06/07/19 1443 06/08/19 1138  WBC 4.1   < > 3.9*   < > 4.6 4.2 3.9*  NEUTROABS  --   --   --   --  3.0  --   --   HGB 12.2*   < > 12.9*   < > 14.4 13.0 12.9*  HCT 41.2   < > 43.2   < > 46.7 42.7 41.9  MCV 85.8  --  85.4  --  83.1 83.2 83.3  PLT 135*   < > 128*   < > 180 141* 152   < > = values in this interval not displayed.   Cardiac Enzymes: No results for input(s): CKTOTAL, CKMB, CKMBINDEX, TROPONINI in the last 168 hours. CBG: No results for input(s): GLUCAP in the last 168 hours.  Medications:  . acyclovir  400 mg Oral Q lunch  . aspirin EC  81 mg Oral Daily  .  atorvastatin  40 mg Oral QHS  . Chlorhexidine Gluconate Cloth  6 each Topical Q0600  . dorzolamide-timolol  1 drop Both Eyes BID  . doxercalciferol  2 mcg Intravenous Q M,W,F-HD  . feeding supplement (NEPRO CARB STEADY)  237 mL Oral BID BM  . heparin  5,000 Units Subcutaneous Q8H  . metoprolol succinate  12.5 mg Oral Daily  . multivitamin  1 tablet Oral QHS  . pantoprazole  40 mg Oral Daily  . prednisoLONE acetate  1 drop Right Eye Q1200  . sevelamer carbonate  1,600 mg Oral TID WC

## 2019-06-10 ENCOUNTER — Inpatient Hospital Stay (HOSPITAL_COMMUNITY): Payer: Medicare Other | Admitting: Speech Pathology

## 2019-06-10 ENCOUNTER — Inpatient Hospital Stay (HOSPITAL_COMMUNITY): Payer: Medicare Other

## 2019-06-10 ENCOUNTER — Inpatient Hospital Stay (HOSPITAL_COMMUNITY): Payer: Medicare Other | Admitting: Occupational Therapy

## 2019-06-10 LAB — CBC
HCT: 42.9 % (ref 39.0–52.0)
Hemoglobin: 13 g/dL (ref 13.0–17.0)
MCH: 25.7 pg — ABNORMAL LOW (ref 26.0–34.0)
MCHC: 30.3 g/dL (ref 30.0–36.0)
MCV: 84.8 fL (ref 80.0–100.0)
Platelets: 145 10*3/uL — ABNORMAL LOW (ref 150–400)
RBC: 5.06 MIL/uL (ref 4.22–5.81)
RDW: 18.6 % — ABNORMAL HIGH (ref 11.5–15.5)
WBC: 3.5 10*3/uL — ABNORMAL LOW (ref 4.0–10.5)
nRBC: 0 % (ref 0.0–0.2)

## 2019-06-10 LAB — RENAL FUNCTION PANEL
Albumin: 2.8 g/dL — ABNORMAL LOW (ref 3.5–5.0)
Anion gap: 17 — ABNORMAL HIGH (ref 5–15)
BUN: 37 mg/dL — ABNORMAL HIGH (ref 8–23)
CO2: 23 mmol/L (ref 22–32)
Calcium: 9.3 mg/dL (ref 8.9–10.3)
Chloride: 97 mmol/L — ABNORMAL LOW (ref 98–111)
Creatinine, Ser: 10.66 mg/dL — ABNORMAL HIGH (ref 0.61–1.24)
GFR calc Af Amer: 5 mL/min — ABNORMAL LOW (ref >60)
GFR calc non Af Amer: 5 mL/min — ABNORMAL LOW (ref >60)
Glucose, Bld: 94 mg/dL (ref 70–99)
Phosphorus: 6.7 mg/dL — ABNORMAL HIGH (ref 2.5–4.6)
Potassium: 4.6 mmol/L (ref 3.5–5.1)
Sodium: 137 mmol/L (ref 135–145)

## 2019-06-10 LAB — CBC WITH DIFFERENTIAL/PLATELET
Abs Immature Granulocytes: 0 10*3/uL (ref 0.00–0.07)
Basophils Absolute: 0 10*3/uL (ref 0.0–0.1)
Basophils Relative: 1 %
Eosinophils Absolute: 0.4 10*3/uL (ref 0.0–0.5)
Eosinophils Relative: 13 %
HCT: 39.5 % (ref 39.0–52.0)
Hemoglobin: 11.9 g/dL — ABNORMAL LOW (ref 13.0–17.0)
Immature Granulocytes: 0 %
Lymphocytes Relative: 10 %
Lymphs Abs: 0.3 10*3/uL — ABNORMAL LOW (ref 0.7–4.0)
MCH: 25.6 pg — ABNORMAL LOW (ref 26.0–34.0)
MCHC: 30.1 g/dL (ref 30.0–36.0)
MCV: 84.9 fL (ref 80.0–100.0)
Monocytes Absolute: 0.5 10*3/uL (ref 0.1–1.0)
Monocytes Relative: 18 %
Neutro Abs: 1.7 10*3/uL (ref 1.7–7.7)
Neutrophils Relative %: 58 %
Platelets: 127 10*3/uL — ABNORMAL LOW (ref 150–400)
RBC: 4.65 MIL/uL (ref 4.22–5.81)
RDW: 18.5 % — ABNORMAL HIGH (ref 11.5–15.5)
WBC: 2.9 10*3/uL — ABNORMAL LOW (ref 4.0–10.5)
nRBC: 0 % (ref 0.0–0.2)

## 2019-06-10 MED ORDER — DOXERCALCIFEROL 4 MCG/2ML IV SOLN
INTRAVENOUS | Status: AC
  Administered 2019-06-10: 2 ug via INTRAVENOUS
  Filled 2019-06-10: qty 2

## 2019-06-10 MED ORDER — HEPARIN SODIUM (PORCINE) 1000 UNIT/ML IJ SOLN
INTRAMUSCULAR | Status: AC
  Administered 2019-06-10: 4000 [IU]
  Filled 2019-06-10: qty 4

## 2019-06-10 MED ORDER — HEPARIN SODIUM (PORCINE) 1000 UNIT/ML IJ SOLN
INTRAMUSCULAR | Status: AC
  Administered 2019-06-10: 3800 [IU]
  Filled 2019-06-10: qty 4

## 2019-06-10 NOTE — Progress Notes (Signed)
Occupational Therapy Discharge Summary  Patient Details  Name: Michael [name]. [name]: [id number] Date of Birth: 12-27-1955  Patient has met 9 of 9 long term goals due to improved activity tolerance, improved balance, ability to compensate for deficits, improved attention and improved awareness.  Patient to discharge at overall Modified Independent level from a physical standpoint, however recommending 24/7 from a cognitive standpoint.  Patient's care partner is independent to provide the necessary cognitive assistance at discharge.  Wife and adult son are able to provide the recommended 24/7 supervision.  Reasons goals not met: N/A  Recommendation:  Patient will benefit from ongoing skilled OT services in outpatient setting to continue to advance functional skills in the area of BADL, iADL and Reduce care partner burden.  Equipment: No equipment provided  Reasons for discharge: treatment goals met and discharge from hospital  Patient/family agrees with progress made and goals achieved: Yes  OT Discharge Precautions/Restrictions  Precautions Precautions: Fall Restrictions Weight Bearing Restrictions: No Pain Pain Assessment Pain Scale: 0-10 Pain Score: 0-No pain ADL ADL Eating: Independent Grooming: Independent Where Assessed-Grooming: Standing at sink Upper Body Bathing: Independent Where Assessed-Upper Body Bathing: Standing at sink Lower Body Bathing: Independent Where Assessed-Lower Body Bathing: Standing at sink Upper Body Dressing: Independent Where Assessed-Upper Body Dressing: Sitting at sink Lower Body Dressing: Modified independent Where Assessed-Lower Body Dressing: Sitting at sink, Standing at sink Toileting: Independent Where Assessed-Toileting: Risk analyst Method: Counselling psychologist: Other (comment)(cane) Social research officer, government: Distant supervision Social research officer, government Method:  Heritage manager: Civil engineer, contracting with back, Grab bars Vision Baseline Vision/History: Wears glasses Wears Glasses: Reading only Patient Visual Report: No change from baseline Vision Assessment?: No apparent visual deficits Cognition Overall Cognitive Status: Impaired/Different from baseline Arousal/Alertness: Awake/alert Orientation Level: Oriented X4 Attention: Selective;Sustained Sustained Attention: Appears intact(functional and verbal basic with ADLs) Selective Attention: Appears intact(functional and verbal basic with ADLs) Memory: Impaired Memory Impairment: Decreased short term memory;Decreased recall of new information Decreased Short Term Memory: Verbal basic Awareness: Impaired Awareness Impairment: Emergent impairment Problem Solving: Impaired Safety/Judgment: Impaired Comments: decreased safety awareness with higher level IADLs and overall safety Sensation Sensation Light Touch: Appears Intact Proprioception: Appears Intact Coordination Gross Motor Movements are Fluid and Coordinated: No Fine Motor Movements are Fluid and Coordinated: Yes Coordination and Movement Description: mild decreased balance/postural control with fatigue, able to compensate well with [facility] Finger Nose Finger Test: [facility] Heel Shin Test: [facility] Motor  Motor Motor: Within Functional Limits Motor - Skilled Clinical Observations: mild decrease in balance/postural control with fatigue, able to compensate well with [facility] Mobility  Bed Mobility Bed Mobility: Rolling Right;Rolling Left;Supine to Sit;Sit to Supine Rolling Right: Independent Rolling Left: Independent Supine to Sit: Independent Sit to Supine: Independent Transfers Sit to Stand: Independent  Trunk/Postural Assessment  Cervical Assessment Cervical Assessment: Within Functional Limits Thoracic Assessment Thoracic Assessment: Within Functional Limits Lumbar Assessment Lumbar Assessment: Within Functional Limits Postural  Control Postural Control: Deficits on evaluation Righting Reactions: decreased balance/postural control with fatigue, able to compensate well with [facility]  Balance Balance Balance Assessed: Yes Static Sitting Balance Static Sitting - Balance Support: No upper extremity supported Static Sitting - Level of Assistance: 7: Independent Dynamic Sitting Balance Dynamic Sitting - Balance Support: No upper extremity supported Dynamic Sitting - Level of Assistance: 7: Independent Static Standing Balance Static Standing - Balance Support: No upper extremity supported Static Standing - Level of Assistance: 7: Independent Dynamic Standing Balance Dynamic Standing - Balance Support: Right upper  extremity supported Dynamic Standing - Level of Assistance: 6: Modified independent (Device/Increase time)(SPC) Extremity/Trunk Assessment RUE Assessment General Strength Comments: WFL LUE Assessment LUE Assessment: Within Functional Limits General Strength Comments: WFL   Evolett Somarriba, [place] 06/10/2019, 11:00 AM

## 2019-06-10 NOTE — Progress Notes (Addendum)
Social Work Patient ID: Michael [name]., male   DOB: 22-Apr-1956, 64 y.o.   [name]: [id number]    SW called pt wife [name] [phone]) to discuss team reports on pt being appropriate to d/c tomorrow since he has dialysis today, after pt has requested he leave today. Pt wife reports that she was able to establish 24/7care for him in which her son will be stay with him during the day until she arrives home from work for the next 2 weeks. She reports she has signed the [facility] form. No outpatient preference location, other than a location that is close to their home in [place]. She also states pt has a wooden cane at home, and does not have a CPAP machine anymore, and he has not had it over 5 years now.   *SW informed medical team on pt needs for CPAP machine. Pt will have order for sleep study to determine if needed. Pt accepted for Outpatient PT/OT/ST at [facility]/Outpatient Therapy location [phone]). SW ordered single point cane from [employer] [phone]). SW left message for pt wife on above details. SW made efforts to inform pt, but pt off floor due to procedure (I.e. dialysis).   SW received return phone call from wife, and SW reviewed above. No additional questions/concerns.  Loralee Pacas, MSW, [facility] (preferred): [phone] Cell: [phone] Fax: [phone]

## 2019-06-10 NOTE — Progress Notes (Signed)
Speech Language Pathology Daily Session Note  Patient Details  Name: Michael [name]. [name]: [id number] Date of Birth: 1955/12/20  Today's Date: 06/10/2019 SLP Individual Time: 1000-1056 SLP Individual Time Calculation (min): 56 min  Short Term Goals: Week 1: SLP Short Term Goal 1 (Week 1): STGs = LTGs d/t short ELOS  Skilled Therapeutic Interventions: Pt was seen for skilled ST intervention targeting diagnostic treatment in preparation for DC home soon. Pt was pleasant and cooperative with unfamiliar therapist, but questioned why we "all keep asking him the same things" and that he has "been over safety stuff a thousand times". Pt reports he is "back to "100%", and just wants to get back home.   Pt verbalized that he did not recall having speech therapy during his rehab stay. When asked what he needs to be careful of at home, he stated "the virus. And using my cane". Pt indicated his plan to resume driving in a couple of weeks, and to return to managing his finances as prior to admit. SLP facilitated session by administering the [facility] Mental Status (SLUMS) Examination. Pt scored 20/30, with points lost on attention, mental math, thought organization, delayed recall, digit reversal, and auditory attention/recall of information. SLP led a discussion about pt's physical deficits, and how he needed to use assistance (a cane) to maximize safety, in the same way that his anoxic brain injury has resulted in the need to use assistance (supervision, help with finances, safety) to maximize safety and independence from a cognitive standpoint. Pt verbalized understanding, and stated that this assessment was the first he "hasn't been able to pass".   Pt was left in chair, all needs within reach. Continue ST per current plan of care. Recommend 24 hour supervision at discharge to maximize safety.    Pain Pain Assessment Pain Scale: 0-10 Pain Score: 0-No pain  Therapy/Group: Individual Therapy    Michael [name], [facility], CCC-SLP Speech Language Pathologist   Michael [name] 06/10/2019, 12:35 PM

## 2019-06-10 NOTE — Progress Notes (Signed)
Left subclavian is a tunneled catheter and will need to be removed by IR. Patient's nurse notified. VU. Fran Lowes, RN VAST

## 2019-06-10 NOTE — Progress Notes (Signed)
Physical Therapy Discharge Summary  Patient Details  Name: Michael [name]. [name]: [id number] Date of Birth: 16-Jun-1955  Today's Date: 06/10/2019 PT Individual Time: 1100-1153 PT Individual Time Calculation (min): 53 min    Patient has met 10 of 10 long term goals due to improved activity tolerance, improved balance, improved postural control, increased strength, improved awareness and improved coordination. Patient to discharge at an ambulatory level modified independent with cane. Patient's care partner is independent to provide the necessary physical assistance at discharge. Pt's wife attended family education with OT. Wife did not participate in hands on practice with PT however pt at a mod I/supervision level and doesn't require any actual hands on care. Pt's wife was educated on need for supervision with certain activities and use of cane with ambulation. Pt's wife verbalized confidence assisting pt based on what she saw in OT session.   All goals met   Recommendation:  Patient will benefit from ongoing skilled PT services in outpatient setting to continue to advance safe functional mobility, address ongoing impairments in transfers, ambulation, LE strength, balance/coordination, endurance, and minimize fall risk.  Equipment: single point cane  Reasons for discharge: treatment goals met  Patient/family agrees with progress made and goals achieved: Yes  Today's Interventions: Received pt standing looking out window (pt made Mod I in room today), pt agreeable to therapy, and denied any pain during session. Session focused on discharge planning, functional mobility/transfers, ambulation, stair navigation, simulated car transfers, balance/coordination, LE strength, and improved endurance with activity. Pt ambulated 124f with SPC mod I to therapy gym. Pt performed ambulatory simulated car transfer mod I with SPC from SUV height. Pt ambulated 164fon uneven surfaces (ramp) with SPC  supervision. Pt picked up cup from floor with SPC and supervision. Pt ambulated 10030fith SPC mod I and navigated 12 steps with 1 rail with supervision ascending and descending with a step through pattern for 8 steps and using a step to pattern for last 4 steps. Pt performed floor transfer x 2 trials. Trial 1 pt required CGA and trial 2 pt able to perform with supervision. Pt required verbal cues for technique and safety. Pt ambulated [id number] rehab apartment mod I with SPC. Pt performed furniture transfer on couch and recliner with SPC mod I and bed mobility from flat bed independently. Pt ambulated [id number] SPC mod I to dayroom. Pt performed seated bilateral LE strengthening on Nustep at workload 4 for 4 minutes x 1 trial and 2 minutes x 2 trials for a total of 8 minutes with seated rest breaks in between for a total of 342 steps. Pt ambulated 150ft 17fI with SPC back to room. Concluded session with pt sitting in chair, needs within reach, and no alarms on due to mod I status with cane.   PT Discharge Precautions/Restrictions Precautions Precautions: Fall Restrictions Weight Bearing Restrictions: No  Cognition Overall Cognitive Status: Impaired/Different from baseline Arousal/Alertness: Awake/alert Orientation Level: Oriented X4 Memory: Impaired Awareness: Appears intact Problem Solving: Impaired Safety/Judgment: Impaired Comments: slightly decreased safety awareness, however high level with mobility Sensation Sensation Light Touch: Appears Intact Proprioception: Appears Intact Coordination Gross Motor Movements are Fluid and Coordinated: No Fine Motor Movements are Fluid and Coordinated: Yes Coordination and Movement Description: mild decreased balance/postural control with fatigue, able to compensate well with SPC [facility]. [facility] Cent[facility] Nose Finger Test: WFL HeSt. Joseph Medical CenterShin Test: WFL [facility] [address]  Motor Motor: Within Functional Limits Motor - Skilled Clinical Observations: mild decrease in balance/postural  control with fatigue, able to compensate  well with SPC  Mobility Bed Mobility Bed Mobility: Rolling Right;Rolling Left;Supine to Sit;Sit to Supine Rolling Right: Independent Rolling Left: Independent Supine to Sit: Independent Sit to Supine: Independent Transfers Transfers: Sit to [employer] Transfers Sit to Stand: Independent Stand Pivot Transfers: Independent with assistive device Transfer (Assistive device): Straight cane(with and without SPC) Locomotion  Gait Ambulation: Yes Gait Assistance: Independent with assistive device(SPC) Gait Distance (Feet): 150 Feet Assistive device: Straight cane Gait Gait: Yes Gait Pattern: Impaired Gait Pattern: Narrow base of support;Decreased trunk rotation Gait velocity: decreased Stairs / Additional Locomotion Stairs: Yes Stairs Assistance: Supervision/Verbal cueing Stair Management Technique: One rail Right Number of Stairs: 12 Height of Stairs: 6 Ramp: Supervision/Verbal cueing(SPC) Product manager Mobility: Yes Wheelchair Assistance: Independent with Camera operator: Both upper extremities Wheelchair Parts Management: Needs assistance Distance: 16f  Trunk/Postural Assessment  Cervical Assessment Cervical Assessment: Within Functional Limits Thoracic Assessment Thoracic Assessment: Within Functional Limits Lumbar Assessment Lumbar Assessment: Within Functional Limits Postural Control Postural Control: Deficits on evaluation Righting Reactions: decreased balance/postural control with fatigue, able to compensate well with [facility] Balance Balance Balance Assessed: Yes Static Sitting Balance Static Sitting - Balance Support: No upper extremity supported Static Sitting - Level of Assistance: 7: Independent Dynamic Sitting Balance Dynamic Sitting - Balance Support: No upper extremity supported Dynamic Sitting - Level of Assistance: 7: Independent Static Standing Balance Static  Standing - Balance Support: No upper extremity supported Static Standing - Level of Assistance: 7: Independent Dynamic Standing Balance Dynamic Standing - Balance Support: Right upper extremity supported Dynamic Standing - Level of Assistance: 6: Modified independent (Device/Increase time)(SPC) Extremity Assessment  RLE Assessment RLE Assessment: Within Functional Limits General Strength Comments: grossly generalized to 4/5 LLE Assessment LLE Assessment: Within Functional Limits General Strength Comments: grossly generalized to 4/5   AAlfonse AlpersPT, DPT  06/10/2019, 8:56 AM

## 2019-06-10 NOTE — Progress Notes (Signed)
[place] PHYSICAL MEDICINE & REHABILITATION PROGRESS NOTE  Subjective/Complaints: Patient seen sitting up at the edge of his bed this morning working with therapies.  Good sitting balance noted.  Patient slept well overnight, slept fairly per sleep chart.  He states he wants to go home tomorrow, discussed with therapies.  He states his condition is at baseline.  ROS: Denies CP, SOB, N/V/D.  Objective: Vital Signs: Blood pressure 118/89, pulse 71, temperature (!) 97.5 F (36.4 C), resp. rate 18, weight 77.4 kg, SpO2 98 %. No results found. Recent Labs    06/08/19 1138 06/10/19 0337  WBC 3.9* 2.9*  HGB 12.9* 11.9*  HCT 41.9 39.5  PLT 152 127*   Recent Labs    06/07/19 1443 06/08/19 1139  NA 139 137  K 4.0 4.1  CL 97* 96*  CO2 25 25  GLUCOSE 130* 98  BUN 35* 41*  CREATININE 10.69* 11.78*  CALCIUM 9.0 9.0    Physical Exam: BP 118/89 (BP Location: Right Leg)   Pulse 71   Temp (!) 97.5 F (36.4 C)   Resp 18   Wt 77.4 kg   SpO2 98%   BMI 27.54 kg/m  Constitutional: No distress . Vital signs reviewed. HENT: Normocephalic.  Atraumatic. Eyes: EOMI. No discharge. Cardiovascular: No JVD. Respiratory: Normal effort.  No stridor. GI: Non-distended. Skin: Warm and dry.  Intact. Psych: Normal mood.  Normal behavior. Musc: Lower extremity edema Neuro: Alert and oriented Motor: Grossly 4+-5/5 throughout  Assessment/Plan: 1. Functional deficits secondary to anoxic BI which require 3+ hours per day of interdisciplinary therapy in a comprehensive inpatient rehab setting.  Physiatrist is providing close team supervision and 24 hour management of active medical problems listed below.  Physiatrist and rehab team continue to assess barriers to discharge/monitor patient progress toward functional and medical goals  Care Tool:  Bathing    Body parts bathed by patient: Right arm, Left arm, Chest, Abdomen, Front perineal area, Buttocks, Right upper leg, Left upper leg, Face          Bathing assist Assist Level: Supervision/Verbal cueing     Upper Body Dressing/Undressing Upper body dressing   What is the patient wearing?: Pull over shirt    Upper body assist Assist Level: Contact Guard/Touching assist    Lower Body Dressing/Undressing Lower body dressing      What is the patient wearing?: Pants     Lower body assist Assist for lower body dressing: Minimal Assistance - Patient > 75%     Toileting Toileting    Toileting assist Assist for toileting: Independent Assistive Device Comment: urinal   Transfers Chair/bed transfer  Transfers assist     Chair/bed transfer assist level: Supervision/Verbal cueing     Locomotion Ambulation   Ambulation assist      Assist level: Supervision/Verbal cueing Assistive device: Cane-straight Max distance: 167ft   Walk 10 feet activity   Assist     Assist level: Supervision/Verbal cueing Assistive device: Cane-straight   Walk 50 feet activity   Assist    Assist level: Supervision/Verbal cueing Assistive device: Cane-straight    Walk 150 feet activity   Assist Walk 150 feet activity did not occur: Safety/medical concerns(increased meandering gait.)  Assist level: Supervision/Verbal cueing Assistive device: Cane-straight    Walk 10 feet on uneven surface  activity   Assist Walk 10 feet on uneven surfaces activity did not occur: Safety/medical concerns   Assist level: Supervision/Verbal cueing Assistive device: Government social research officer Will  patient use wheelchair at discharge?: No Type of Wheelchair: Manual    Wheelchair assist level: Supervision/Verbal cueing Max wheelchair distance: 7'    Wheelchair 50 feet with 2 turns activity    Assist        Assist Level: Minimal Assistance - Patient > 75%   Wheelchair 150 feet activity     Assist     Assist Level: Maximal Assistance - Patient 25 - 49%      Medical Problem List and  Plan: 1.  Impaired mobility and ADLs secondary to anoxic BI.  Continue CIR 2.  Antithrombotics: -DVT/anticoagulation:  Pharmaceutical: Coumadin and Heparin             -antiplatelet therapy: ASA 3. Pain Management: Tylenol prn 4. Mood: LCSW to follow for evaluation and support.              -antipsychotic agents: N/A 5. Neuropsych: This patient is not fully capable of making decisions on his own behalf. Impulsive, requires safety cues.  6. Skin/Wound Care: routine pressure relief measures.  7. Fluids/Electrolytes/Nutrition: Strict I/O. Lytes with HD 8. ESRD: HD MWF at the end of the day to help with tolerance of therapy during the day.  9. H/o VT/ A flutter/NICM: Low dose BB added.   Controlled on 2/8  Monitor with increased mobility. 10. Anemia of chronic disease:  Monitor H/H.             Hemoglobin 11.9 on 2/8, repeat labs ordered  Cont to monitor  11. Thrombocytopenia:  Platelets 127 on 2/8, repeat labs ordered 12. Retinopathy/Glaucoma?: Continue Cosopt bid with pred forte in right eye.  13. Leukopenia  WBCs 2.9 on 2/8, repeat labs ordered  Continue to monitor  LOS: 6 days A FACE TO FACE EVALUATION WAS PERFORMED  Michael [name] Lorie Phenix 06/10/2019, 8:54 AM

## 2019-06-10 NOTE — Progress Notes (Signed)
Occupational Therapy Session Note  Patient Details  Name: Michael [name]. [name]: [id number] Date of Birth: 11-Oct-1955  Today's Date: 06/10/2019 OT Individual Time: 2595-6387 OT Individual Time Calculation (min): 75 min    Short Term Goals: Week 1:  OT Short Term Goal 1 (Week 1): STG = LTGs due to ELOS  Skilled Therapeutic Interventions/Progress Updates:    Treatment session with focus on safety and independence with ADL retraining.  Pt received seated EOB reporting desire to d/c home today.  Discussed current progress towards goals and plan for HD today, possible d/c for tomorrow.  Pt ambulated around room without AD while gathering clothing in preparation for bathing.  Completed bathing and dressing at sink side due to HD port in chest.  Pt completed all bathing/dressing at overall Mod I - Independent level as he requested therapist step out of room.  Discussed current recommendation for supervision and protocols on rehab, therefore pt agreeable to therapist standing on other side of curtain.  Engaged in cognitive challenge with incorporating way finding activity to challenge working memory and awareness.  Pt overall Mod I with mobility, continue to recommend 24/7 supervision due to continued cognitive impairments.  Spoke with MD and treatment team.  Plan for d/c tomorrow 2/9.  Therapy Documentation Precautions:  Precautions Precautions: Fall Precaution Comments: recent CPR, but has no c/o pain. Restrictions Weight Bearing Restrictions: No Pain: Pain Assessment Pain Scale: 0-10 Pain Score: 0-No pain ADL: ADL Eating: Independent Grooming: Independent Where Assessed-Grooming: Standing at sink Upper Body Bathing: Independent Where Assessed-Upper Body Bathing: Standing at sink Lower Body Bathing: Independent Where Assessed-Lower Body Bathing: Standing at sink Upper Body Dressing: Independent Where Assessed-Upper Body Dressing: Sitting at sink Lower Body Dressing: Modified  independent Where Assessed-Lower Body Dressing: Sitting at sink, Standing at sink Toileting: Independent Where Assessed-Toileting: Glass blower/designer: Diplomatic Services operational officer Method: Counselling psychologist: Other (comment)(cane) Social research officer, government: Distant supervision Social research officer, government Method: Heritage manager: Civil engineer, contracting with back, Grab bars   Therapy/Group: Individual Therapy  Simonne Come 06/10/2019, 10:56 AM

## 2019-06-10 NOTE — Progress Notes (Signed)
Pt checked qh for safety. Bed low with fall alarm on and call bell within reach.  

## 2019-06-10 NOTE — Progress Notes (Signed)
Subjective:  Eating lunch , no cos , states  Dc soon   Objective Vital signs in last 24 hours: Vitals:   06/10/19 1300 06/10/19 1314 06/10/19 1315 06/10/19 1330  BP: (!) 141/82 (!) 148/73 (!) 144/79 134/67  Pulse: 75 64 73 67  Resp: 16     Temp: 98.1 F (36.7 C)     TempSrc: Oral     SpO2: 95%     Weight: 80.2 kg      Weight change: -2.9 kg  Physical Exam: General: nad , alert  CArd : RRR , no rub  Lungs:  CTA n labored  breathing  Extremities: Bilat. Lower extrem pedal edema trace Dialysis Access: R IJ  Perm Cath    Dialysis Orders: [facility] MWF 4.5h 400/800 79kg 2/2 bath TDC Hep 4000 - no esa active - last Hb 13.9 on 1/22 - parsabiv 2.5 mg 3 x/week - hectorol 2 mcg IV each tx  Problem/Plan: 1.  Cardiac Arrest - SP Cooling Protocol= / card / EP following  --> improved/ Debilitation  Now on CIR  Dc soon 2. ESRD - HD  MWF. On  schedule   3. Acute Hypoxic Resp. Failure sp Vent = resolved  now on RA  And CIR 4. Nonischemic CM  EF  2 d echo "<20%" EP and Card following 5. HTN/volume -  bp stable, on Metoprol 12.5 mg bid  / CXR 1/27 - vasc congestion now improved.  UF 2L today  as tol, standing wt post. Fu post wt today  , no edw changes ? Fu today wt/ bp s on hd   6. Anemia - HGB 12+  No esa needs  7. Secondary hyperparathyroidism -phos 10.4 > 6.7 binders  Ca corec 9.9  / Renvela/   as binders  , on Hec  2 mcg 8. Nutrition= Nepro / renal diet/  Renal vit  9. OSA = per admit  10. Dispo: anticipate d/c soon  Ernest Haber, Hershal Cor[facility] Beeper [phone] 06/10/2019,2:29 PM  LOS: 6 days   Labs: Basic Metabolic Panel: Recent Labs  Lab 06/07/19 1443 06/08/19 1139 06/10/19 1330  NA 139 137 137  K 4.0 4.1 4.6  CL 97* 96* 97*  CO2 25 25 23   GLUCOSE 130* 98 94  BUN 35* 41* 37*  CREATININE 10.69* 11.78* 10.66*  CALCIUM 9.0 9.0 9.3  PHOS 6.0* 6.7* 6.7*   Liver Function Tests: Recent Labs  Lab 06/07/19 1443 06/08/19 1139 06/10/19 1330  ALBUMIN  2.6* 2.6* 2.8*   No results for input(s): LIPASE, AMYLASE in the last 168 hours. No results for input(s): AMMONIA in the last 168 hours. CBC: Recent Labs  Lab 06/05/19 1346 06/05/19 1346 06/07/19 1443 06/07/19 1443 06/08/19 1138 06/10/19 0337 06/10/19 1057  WBC 4.6   < > 4.2   < > 3.9* 2.9* 3.5*  NEUTROABS 3.0  --   --   --   --  1.7  --   HGB 14.4   < > 13.0   < > 12.9* 11.9* 13.0  HCT 46.7   < > 42.7   < > 41.9 39.5 42.9  MCV 83.1  --  83.2  --  83.3 84.9 84.8  PLT 180   < > 141*   < > 152 127* 145*   < > = values in this interval not displayed.   Cardiac Enzymes: No results for input(s): CKTOTAL, CKMB, CKMBINDEX, TROPONINI in the last 168 hours. CBG: No results for input(s): GLUCAP  in the last 168 hours.  Studies/Results: No results found. Medications:  . acyclovir  400 mg Oral Q lunch  . aspirin EC  81 mg Oral Daily  . atorvastatin  40 mg Oral QHS  . Chlorhexidine Gluconate Cloth  6 each Topical Q0600  . dorzolamide-timolol  1 drop Both Eyes BID  . doxercalciferol  2 mcg Intravenous Q M,W,F-HD  . feeding supplement (NEPRO CARB STEADY)  237 mL Oral BID BM  . heparin  5,000 Units Subcutaneous Q8H  . metoprolol succinate  12.5 mg Oral Daily  . multivitamin  1 tablet Oral QHS  . pantoprazole  40 mg Oral Daily  . prednisoLONE acetate  1 drop Right Eye Q1200  . sevelamer carbonate  1,600 mg Oral TID WC

## 2019-06-10 NOTE — Plan of Care (Signed)
  Problem: Consults Goal: RH GENERAL PATIENT EDUCATION Description: See Patient Education module for education specifics. 06/10/2019 1536 by Glean Salen, RN Outcome: Progressing 06/10/2019 1535 by Glean Salen, RN Outcome: Progressing   Problem: RH BOWEL ELIMINATION Goal: RH STG MANAGE BOWEL WITH ASSISTANCE Description: STG Manage Bowel with mod I Assistance. 06/10/2019 1536 by Glean Salen, RN Outcome: Progressing Flowsheets (Taken 06/10/2019 1536) STG: Pt will manage bowels with assistance: 6-Modified independent 06/10/2019 1535 by Glean Salen, RN Outcome: Progressing Goal: RH STG MANAGE BOWEL W/MEDICATION W/ASSISTANCE Description: STG Manage Bowel with Medication with mod I Assistance. 06/10/2019 1536 by Glean Salen, RN Outcome: Progressing Flowsheets (Taken 06/10/2019 1536) STG: Pt will manage bowels with medication with assistance: 6-Modified independent 06/10/2019 1535 by Glean Salen, RN Outcome: Progressing   Problem: RH SAFETY Goal: RH STG ADHERE TO SAFETY PRECAUTIONS W/ASSISTANCE/DEVICE Description: STG Adhere to Safety Precautions With mod I Assistance/Device. 06/10/2019 1536 by Glean Salen, RN Outcome: Progressing Flowsheets (Taken 06/10/2019 1536) STG:Pt will adhere to safety precautions with assistance/device: 6-Modified independent 06/10/2019 1535 by Glean Salen, RN Outcome: Progressing   Problem: RH KNOWLEDGE DEFICIT GENERAL Goal: RH STG INCREASE KNOWLEDGE OF SELF CARE AFTER HOSPITALIZATION Description: Patient/caregiver will verbalize understanding of management of chronic conditions including diet, exercise, monitoring, medications, and follow up care with min assist. 06/10/2019 1536 by Glean Salen, RN Outcome: Progressing 06/10/2019 1535 by Glean Salen, RN Outcome: Progressing

## 2019-06-11 MED ORDER — SEVELAMER CARBONATE 800 MG PO TABS: 1600.0000 mg | ORAL_TABLET | Freq: Three times a day (TID) | ORAL | 0 refills | Status: DC

## 2019-06-11 MED ORDER — RENA-VITE PO TABS: 1.0000 | ORAL_TABLET | Freq: Every day | ORAL | 0 refills | Status: DC

## 2019-06-11 MED ORDER — PANTOPRAZOLE SODIUM 40 MG PO TBEC: 40.0000 mg | DELAYED_RELEASE_TABLET | Freq: Every day | ORAL | 0 refills | Status: DC

## 2019-06-11 MED ORDER — SENNA 8.6 MG PO TABS: 1.0000 | ORAL_TABLET | Freq: Two times a day (BID) | ORAL | 0 refills | Status: DC | PRN

## 2019-06-11 MED ORDER — METOPROLOL SUCCINATE ER 25 MG PO TB24: 12.5000 mg | ORAL_TABLET | Freq: Every day | ORAL | 0 refills | Status: DC

## 2019-06-11 NOTE — Progress Notes (Signed)
Social Work Patient ID: Michael [name]., male   DOB: [birth year], 64 y.o.   [name]: [id number]   SW confirms [place] cane delivered to room. SW faxed FMLA forms to [phone]. SW called pt wife to inform on forms received, and to follow up with her HR if there are any issues. She was reminded that she has a copy that she can provide to them as well.  Loralee Pacas, MSW, Social Worker Office (preferred): [phone] Cell: [phone] Fax: [phone]

## 2019-06-11 NOTE — Progress Notes (Signed)
Pt checked qh for safety. Bed low with fall alarm on and call bell within reach.  

## 2019-06-11 NOTE — Discharge Summary (Addendum)
Physician Discharge Summary  Patient ID: Michael [name]. [name]: [id number] DOB/AGE: October 22, 1955 64 y.o.  Admit date: 06/04/2019 Discharge date: 06/11/2019  Discharge Diagnoses:  Principal Problem:   Anoxic brain injury [facility]) Active Problems:   Cardiac arrest ([place])   Leukopenia   Thrombocytopenia ([facility])   Anemia of chronic disease   History of ventricular tachycardia   Discharged Condition: stable   Significant Diagnostic Studies: N/A   Labs:  Basic Metabolic Panel:   CBC: Recent Labs  Lab 06/05/19 1346 06/07/19 1443 06/08/19 1138 06/10/19 0337 06/10/19 1057  WBC 4.6   < > 3.9* 2.9* 3.5*  NEUTROABS 3.0  --   --  1.7  --   HGB 14.4   < > 12.9* 11.9* 13.0  HCT 46.7   < > 41.9 39.5 42.9  MCV 83.1   < > 83.3 84.9 84.8  PLT 180   < > 152 127* 145*   < > = values in this interval not displayed.    CBG: No results for input(s): GLUCAP in the last 168 hours.  Brief HPI:   Michael [name]. is a 64 y.o. male with history of OSA, retinopathy, and CIM, VT/a flutter s/p ablation 04/2019, ESRD-hemodialysis was admitted on 05/29/2019 for AV fistula versus graft placement due to problems with prior access.  Prior to initiation of procedure patient sustained cardiac arrest requiring CPR x4 minutes with ACLS protocol prior to ROSC.  Arrest felt to be respiratory in nature due to sedatives and rapid decline due to compromised RV function as bedside echo reported to show global hypokinesis with EF of 20%.  He underwent hemodialysis to optimize volume status.  EEG done showing diffuse encephalopathy.  MRI brain done showing mild brain atrophy and no signs of anoxic injury.  He has had issues with agitation as well as cognitive deficits with delayed responses, poor safety awareness, fatigue and unsteady gait affecting ADLs as well as mobility.  CIR was recommended due to deficits from anoxic brain injury.   Hospital Course: Michael [name]. was admitted to rehab 06/04/2019 for inpatient  therapies to consist of PT, ST and OT at least three hours five days a week. Past admission physiatrist, therapy team and rehab RN have worked together to provide customized collaborative inpatient rehab. Hemodialysis has been ongoing on Monday Wednesday Friday at end of the day to help with tolerance of activity.  His blood pressure and heart rate were monitored on TID basis and are reasonably stable.  He did not report any chest pain or palpitations with increase in activity level.  Follow-up CBC shows H&H to improving.  Neutropenia is stable and thrombocytopenia is resolving.  His balance is improved and is currently modified independent to ambulate with cane.  He however continues to have poor safety awareness and recall affecting attention thought organization and safety with high-level cognitive tasks.  SLUMS exam revealed score of 20/30.  He has made good gains during his rehab stay and supervision is recommended for safety due to cognitive deficits.  During patient's stay in rehab team conference was held to monitor patient's progress and goals and discuss barriers to discharge. At admission patient required min assist with ADL tasks and min assist with mobility. He exhibited moderate cognitive deficits affecting recall, memory, process, problem solving and had no awareness of deficits. Speech was 80% intelligible and he required more than reasonable amount of time to follow commands and formulate answers.  He has had improvement in activity tolerance, balance, attention, and  ability to compensate for deficits.  He requires supervision with ADL tasks.  He is modified independent for transfers and is able to ambulate 150 feet with straight cane at modified independent level.  He requires supervision to navigate 12 stairs.  He continues to have poor awareness of deficits and requires family education was completed with patient's wife and son regarding need for 24/7 supervision for safety due to balance and  cognitive deficits.   Disposition: Home  Diet: Renal diet. 1200 cc FR.   Special Instructions: 1. Family to assist/manage medications. 2. No driving or strenuous activity till cleared by cardiology.    Discharge Instructions     Ambulatory referral to Physical Medicine Rehab   Complete by: As directed    1-2 weeks TC appointment. Needs Tue/Thus appointment   Ambulatory referral to Pulmonology   Complete by: As directed    History of OSA--needs sleep study.      Allergies as of 06/11/2019   No Known Allergies      Medication List     STOP taking these medications    HYDROcodone-acetaminophen 5-325 MG tablet Commonly known as: Norco       TAKE these medications    acetaminophen 325 MG tablet Commonly known as: TYLENOL Take 1-2 tablets (325-650 mg total) by mouth every 4 (four) hours as needed for mild pain.   acyclovir 400 MG tablet Commonly known as: ZOVIRAX Take 400 mg by mouth daily with lunch.   aspirin 81 MG EC tablet Take 1 tablet (81 mg total) by mouth daily.   atorvastatin 40 MG tablet Commonly known as: LIPITOR Take 40 mg by mouth at bedtime.   dorzolamide-timolol 22.3-6.8 MG/ML ophthalmic solution Commonly known as: COSOPT Place 1 drop into both eyes 2 (two) times daily.   metoprolol succinate 25 MG 24 hr tablet Commonly known as: TOPROL-XL Take 0.5 tablets (12.5 mg total) by mouth daily.   multivitamin Tabs tablet Take 1 tablet by mouth at bedtime.   nitroGLYCERIN 0.4 MG SL tablet Commonly known as: NITROSTAT Place 1 tablet (0.4 mg total) under the tongue every 5 (five) minutes x 3 doses as needed for chest pain.   pantoprazole 40 MG tablet Commonly known as: PROTONIX Take 1 tablet (40 mg total) by mouth daily. Start taking on: June 12, 2019   prednisoLONE acetate 1 % ophthalmic suspension Commonly known as: PRED FORTE Place 1 drop into the right eye daily at 12 noon.   senna 8.6 MG Tabs tablet Commonly known as:  SENOKOT Take 1 tablet (8.6 mg total) by mouth 2 (two) times daily as needed for mild constipation.   sevelamer carbonate 800 MG tablet Commonly known as: RENVELA Take 2 tablets (1,600 mg total) by mouth 3 (three) times daily with meals. What changed:  when to take this additional instructions       Follow-up Information     Jamse Arn, MD Follow up.   Specialty: Physical Medicine and Rehabilitation Why: Office will call you with follow up appointment Contact information: [address] [place] [address] [phone]         Richardson Dopp T, PA-C Follow up on 06/25/2019.   Specialties: Cardiology, Physician Assistant Why: Appointment at 2:15 pm.  Contact information: [address] [place] [address] [phone]         [name], MD. Call.   Specialty: Family Medicine Why: for post hospital follow up Contact information: [address]  [place] [address] [phone]         Evans Lance, MD. Call on 07/04/2019.   Specialty: Cardiology Why: for follow up appointment at 4:15 pm Contact information: [address] [address] [phone]         Marty Heck, MD Follow up on 06/25/2019.   Specialty: Vascular Surgery Why: Appointment at 3 pm Contact information: [address] [place] [address] [phone]            Signed: Bary Leriche 06/11/2019, 4:44 PM Patient was seen, face-face, and physical exam performed by me on day of discharge, less than 30 minutes of total time spent.. Please see progress note from day of discharge as well.  Delice Lesch, MD, ABPMR

## 2019-06-11 NOTE — Progress Notes (Signed)
Patient discharged home to wife, all belongings sent with patient. No s/s of distress noted at this time.  Audie Clear, LPN

## 2019-06-11 NOTE — Progress Notes (Signed)
[facility] PHYSICAL MEDICINE & REHABILITATION PROGRESS NOTE  Subjective/Complaints: Patient seen laying in bed this morning.  He he states he slept well overnight.  He slept fair may well per sleep chart.  He states he is ready for discharge.  He is questions and necessity of access ports.  He was seen by nephro yesterday, notes reviewed.  Communicated with SLP regarding patient's baseline level of cognition as per wife.  And patient education.  ROS: Denies CP, SOB, N/V/D.  Objective: Vital Signs: Blood pressure 134/77, pulse 67, temperature 98 F (36.7 C), temperature source Oral, resp. rate 18, weight 78.2 kg, SpO2 98 %. No results found. Recent Labs    06/10/19 0337 06/10/19 1057  WBC 2.9* 3.5*  HGB 11.9* 13.0  HCT 39.5 42.9  PLT 127* 145*   Recent Labs    06/08/19 1139 06/10/19 1330  NA 137 137  K 4.1 4.6  CL 96* 97*  CO2 25 23  GLUCOSE 98 94  BUN 41* 37*  CREATININE 11.78* 10.66*  CALCIUM 9.0 9.3    Physical Exam: BP 134/77 (BP Location: Right Leg)   Pulse 67   Temp 98 F (36.7 C) (Oral)   Resp 18   Wt 78.2 kg   SpO2 98%   BMI 27.83 kg/m  Constitutional: No distress . Vital signs reviewed. HENT: Normocephalic.  Atraumatic. Eyes: EOMI. No discharge. Cardiovascular: No JVD. Respiratory: Normal effort.  No stridor. GI: Non-distended. Skin: Warm and dry.  Intact. Psych: Normal mood.  Normal behavior. Musc: Lower extremity edema improving Neuro: Alert and oriented Motor: Grossly 4+-5/5 throughout, unchanged  Assessment/Plan: 1. Functional deficits secondary to anoxic BI which require 3+ hours per day of interdisciplinary therapy in a comprehensive inpatient rehab setting.  Physiatrist is providing close team supervision and 24 hour management of active medical problems listed below.  Physiatrist and rehab team continue to assess barriers to discharge/monitor patient progress toward functional and medical goals  Care Tool:  Bathing  Bathing activity  did not occur: Refused Body parts bathed by patient: Right arm, Left arm, Chest, Abdomen, Front perineal area, Buttocks, Right upper leg, Left upper leg, Face         Bathing assist Assist Level: Set up assist     Upper Body Dressing/Undressing Upper body dressing   What is the patient wearing?: Pull over shirt    Upper body assist Assist Level: Independent    Lower Body Dressing/Undressing Lower body dressing      What is the patient wearing?: Underwear/pull up, Pants     Lower body assist Assist for lower body dressing: Independent     Toileting Toileting    Toileting assist Assist for toileting: Independent with assistive device Assistive Device Comment: urinal   Transfers Chair/bed transfer  Transfers assist     Chair/bed transfer assist level: Independent with assistive device Chair/bed transfer assistive device: Research officer, political party   Ambulation assist      Assist level: Independent with assistive device Assistive device: Cane-straight Max distance: 158ft   Walk 10 feet activity   Assist     Assist level: Independent with assistive device Assistive device: Cane-straight   Walk 50 feet activity   Assist    Assist level: Independent with assistive device Assistive device: Cane-straight    Walk 150 feet activity   Assist Walk 150 feet activity did not occur: Safety/medical concerns(increased meandering gait.)  Assist level: Independent with assistive device Assistive device: Cane-straight    Walk 10 feet on  uneven surface  activity   Assist Walk 10 feet on uneven surfaces activity did not occur: Safety/medical concerns   Assist level: Supervision/Verbal cueing Assistive device: Hospital doctor     Assist Will patient use wheelchair at discharge?: No Type of Wheelchair: Manual    Wheelchair assist level: Independent Max wheelchair distance: 111ft    Wheelchair 50 feet with 2 turns  activity    Assist        Assist Level: Independent   Wheelchair 150 feet activity     Assist     Assist Level: Independent      Medical Problem List and Plan: 1.  Impaired mobility and ADLs secondary to anoxic BI.  DC today  Will see patient for hospital follow-up in 1 month post-discharge  VIR today for DC IV access prior to discharge 2.  Antithrombotics: -DVT/anticoagulation:  Pharmaceutical: Coumadin and Heparin             -antiplatelet therapy: ASA 3. Pain Management: Tylenol prn 4. Mood: LCSW to follow for evaluation and support.              -antipsychotic agents: N/A 5. Neuropsych: This patient is not fully capable of making decisions on his own behalf. Impulsive, requires safety cues.  6. Skin/Wound Care: routine pressure relief measures.  7. Fluids/Electrolytes/Nutrition: Strict I/Os. Lytes with HD 8. ESRD: HD MWF at the end of the day to help with tolerance of therapy during the day.  9. H/o VT/ A flutter/NICM: Low dose BB added.   Controlled on 2/9  Monitor with increased mobility. 10. Anemia of chronic disease:  Monitor H/H.             Hemoglobin 13.0 on 2/8  Cont to monitor  11. Thrombocytopenia:  Platelets 145 on 2/8, monitor as outpatient 12. Retinopathy/Glaucoma?: Continue Cosopt bid with pred forte in right eye.  13. Leukopenia  WBCs 3.5 on 2/8, monitor as outpatient  Continue to monitor  LOS: 7 days A FACE TO FACE EVALUATION WAS PERFORMED  Shammond Arave Lorie Phenix 06/11/2019, 8:36 AM

## 2019-06-11 NOTE — Progress Notes (Signed)
Subjective:  Tolerated hd yest / awaitng Central line removal today then dc home   Objective Vital signs in last 24 hours: Vitals:   06/10/19 1646 06/10/19 1957 06/11/19 0500 06/11/19 0547  BP: 127/65 121/75  134/77  Pulse: 67 70  67  Resp: 17 16  18   Temp: (!) 97.5 F (36.4 C) 98.2 F (36.8 C)  98 F (36.7 C)  TempSrc: Oral   Oral  SpO2: 99% 99%  98%  Weight: 77.8 kg  78.2 kg    Weight change: 2.8 kg  Physical Exam: General:nad , alert  CArd : RRR , no rub  Lungs:  CTA n labored breathing  Extremities: Bilat. Lower extrem pedal edema trace   Has Left chest central line  Dialysis Access: R IJ Perm Cath   Dialysis Orders: [facility] MWF 4.5h 400/800 79kg 2/2 bath TDC Hep 4000 - no esa active - last Hb 13.9 on 1/22 - parsabiv 2.5 mg 3 x/week - hectorol 2 mcg IV each tx  Problem/Plan: 1. Cardiac Arrest - SP Cooling Protocol= / card / EP following -->improved/ Debilitation  Now on CIR  Dc today  2. ESRD - HD MWF. On  schedule   3. Acute Hypoxic Resp. Failure sp Vent = resolved now on RA And CIR 4. Nonischemic CM EF 2 d echo "<20%" EP and Card following 5. HTN/volume - bp stable, on Metoprol 12.5 mg bid / CXR 1/27 - vasc congestion now improved. UF 1732  Cc yesterday ,wt to 77.8  / new edw to 78 kg with clothes   6. Anemia - HGB 12+ No esa needs  7. Secondary hyperparathyroidism -phos 10.4 >6.7 binders Ca corec 9.9 / Renvela/ as binders , on Hec 2 mcg 8. Nutrition= Nepro / renal diet/ Renal vit  9. OSA= per admit  10. Dispo: anticipate d/c  Today after central ine removed  His RN is aware to get central line out before dc   Ernest Haber, PA-C [facility] [phone] 06/11/2019,10:23 AM  LOS: 7 days   Labs: Basic Metabolic Panel: Recent Labs  Lab 06/07/19 1443 06/08/19 1139 06/10/19 1330  NA 139 137 137  K 4.0 4.1 4.6  CL 97* 96* 97*  CO2 25 25 23   GLUCOSE 130* 98 94  BUN 35* 41* 37*  CREATININE 10.69* 11.78* 10.66*   CALCIUM 9.0 9.0 9.3  PHOS 6.0* 6.7* 6.7*   Liver Function Tests: Recent Labs  Lab 06/07/19 1443 06/08/19 1139 06/10/19 1330  ALBUMIN 2.6* 2.6* 2.8*   No results for input(s): LIPASE, AMYLASE in the last 168 hours. No results for input(s): AMMONIA in the last 168 hours. CBC: Recent Labs  Lab 06/05/19 1346 06/05/19 1346 06/07/19 1443 06/07/19 1443 06/08/19 1138 06/10/19 0337 06/10/19 1057  WBC 4.6   < > 4.2   < > 3.9* 2.9* 3.5*  NEUTROABS 3.0  --   --   --   --  1.7  --   HGB 14.4   < > 13.0   < > 12.9* 11.9* 13.0  HCT 46.7   < > 42.7   < > 41.9 39.5 42.9  MCV 83.1  --  83.2  --  83.3 84.9 84.8  PLT 180   < > 141*   < > 152 127* 145*   < > = values in this interval not displayed.   Cardiac Enzymes: No results for input(s): CKTOTAL, CKMB, CKMBINDEX, TROPONINI in the last 168 hours. CBG: No results for input(s): GLUCAP  in the last 168 hours.  Studies/Results: No results found. Medications:  . acyclovir  400 mg Oral Q lunch  . aspirin EC  81 mg Oral Daily  . atorvastatin  40 mg Oral QHS  . Chlorhexidine Gluconate Cloth  6 each Topical Q0600  . dorzolamide-timolol  1 drop Both Eyes BID  . doxercalciferol  2 mcg Intravenous Q M,W,F-HD  . feeding supplement (NEPRO CARB STEADY)  237 mL Oral BID BM  . heparin  5,000 Units Subcutaneous Q8H  . metoprolol succinate  12.5 mg Oral Daily  . multivitamin  1 tablet Oral QHS  . pantoprazole  40 mg Oral Daily  . prednisoLONE acetate  1 drop Right Eye Q1200  . sevelamer carbonate  1,600 mg Oral TID WC

## 2019-06-11 NOTE — Discharge Instructions (Signed)
Inpatient Rehab Discharge Instructions  [name]. Discharge date and time: 06/12/19   Activities/Precautions/ Functional Status: Activity: no lifting, driving, or strenuous exercise till cleared by MD. Cardiology will let you know when you are cleared to resume driving.  Diet: renal diet Limit to 5 cups fluid/day Wound Care: none needed   Functional status:  ___ No restrictions     ___ Walk up steps independently _X__ 24/7 supervision/assistance   ___ Walk up steps with assistance ___ Intermittent supervision/assistance  ___ Bathe/dress independently ___ Walk with walker     ___ Bathe/dress with assistance ___ Walk Independently    ___ Shower independently ___ Walk with assistance    _X__ Shower with assistance _X__ No alcohol     ___ Return to work/school ________     COMMUNITY REFERRALS UPON DISCHARGE:    Outpatient: PT     OT    ST                 Agency: [facility]                          Phone: [phone]              Appointment Date/Time: *Please expect follow-up within 3-5 business days to schedule your visit. If you do not receive follow-up, be sure to contact the center location*  Medical Equipment/Items Ordered: Single [employer]                                                 Agency/Supplier: [facility] [phone]   Special Instructions: 1. Family will need to manage and administer medications till cleared. 2. Needs 24 hours supervision. Will need to talk to cardiology about being cleared to drive again.    My questions have been answered and I understand these instructions. I will adhere to these goals and the provided educational materials after my discharge from the hospital.  Patient/Caregiver Signature _______________________________ Date __________  Clinician Signature _______________________________________ Date __________  Please bring this form and your medication list with you  to all your follow-up doctor's appointments.

## 2019-06-12 ENCOUNTER — Telehealth: Payer: Self-pay | Admitting: Registered Nurse

## 2019-06-12 DIAGNOSIS — Z992 Dependence on renal dialysis: Secondary | ICD-10-CM | POA: Diagnosis not present

## 2019-06-12 DIAGNOSIS — N186 End stage renal disease: Secondary | ICD-10-CM | POA: Diagnosis not present

## 2019-06-12 DIAGNOSIS — N2581 Secondary hyperparathyroidism of renal origin: Secondary | ICD-10-CM | POA: Diagnosis not present

## 2019-06-12 NOTE — Progress Notes (Signed)
Social Work Discharge Note   The overall goal for the admission was met for:   Discharge location: Yes. Home with 24/7 care of spouse and son.  Length of Stay: Yes. 7 days.   Discharge activity level: Yes. Min/Supervision. 24/7 care due to cognition.   Home/community participation: Yes  Services provided included: MD, RD, PT, OT, SLP, RN, CM, TR, Pharmacy, Neuropsych and SW  Financial Services: Medicare  Follow-up services arranged: Outpatient: PT /OT /ST at [facility] at [place] # [phone]              DME: [facility] [phone]  Comments (or additional information): Contact pt wife Deavon Podgorski [phone] .  Patient/Family verbalized understanding of follow-up arrangements: Yes  Individual responsible for coordination of the follow-up plan: Pt wife will coordinate his care needs  Confirmed correct DME delivered: Rana Snare 06/12/2019    Loralee Pacas, MSW, Social Worker Office (preferred): [phone] Cell: [phone] Fax: [phone]

## 2019-06-12 NOTE — Telephone Encounter (Signed)
Transitional Care call Transitional Questions asked to Mr and Mrs Akram.   Patient name: Michael [name]  DOB: [birth year] 1. Are you/is patient experiencing any problems since coming home? No a. Are there any questions regarding any aspect of care? No 2. Are there any questions regarding medications administration/dosing? No a. Are meds being taken as prescribed? Yes b. "Patient should review meds with caller to confirm"  Medication List Reviewed. 3. Have there been any falls? No 4. Has Home Health been to the house and/or have they contacted you? Yes: [facility]: [place] a. If not, have you tried to contact them? NA b. Can we help you contact them? NA c. Are bowels and bladder emptying properly? Bowels Yes:  ESRD On Hemodialysis Mon, Wednesday and Friday.  d. Are there any unexpected incontinence issues? No e. If applicable, is patient following bowel/bladder programs? NA 5. Any fevers, problems with breathing, unexpected pain? No 6. Are there any skin problems or new areas of breakdown? No 7. Has the patient/family member arranged specialty MD follow up (ie cardiology/neurology/renal/surgical/etc.)?  HFU appointments are scheduled except For Dr. Alyson Ingles, he was instructed to call to [facility] appointments.  a. Can we help arrange? NA 8. Does the patient need any other services or support that we can help arrange? No 9. Are caregivers following through as expected in assisting the patient? Yes 10. Has the patient quit smoking, drinking alcohol, or using drugs as recommended? (                        )  Appointment date/time Mr. Halling on hemodialysis on Mon Wed and Friday: Had to changed to [facility] appointments due to his HD schedule and conflicts with other doctor appointments. 07/04/2019 with Dr Posey Pronto at 1:40 and  arrival time 1:20. At [facility]

## 2019-06-12 NOTE — Telephone Encounter (Signed)
Placed a call to Mrs. Aliano, no answer. Left message to return the call.

## 2019-06-14 DIAGNOSIS — N2581 Secondary hyperparathyroidism of renal origin: Secondary | ICD-10-CM | POA: Diagnosis not present

## 2019-06-14 DIAGNOSIS — Z992 Dependence on renal dialysis: Secondary | ICD-10-CM | POA: Diagnosis not present

## 2019-06-14 DIAGNOSIS — N186 End stage renal disease: Secondary | ICD-10-CM | POA: Diagnosis not present

## 2019-06-17 DIAGNOSIS — N2581 Secondary hyperparathyroidism of renal origin: Secondary | ICD-10-CM | POA: Diagnosis not present

## 2019-06-17 DIAGNOSIS — Z992 Dependence on renal dialysis: Secondary | ICD-10-CM | POA: Diagnosis not present

## 2019-06-17 DIAGNOSIS — N186 End stage renal disease: Secondary | ICD-10-CM | POA: Diagnosis not present

## 2019-06-19 DIAGNOSIS — N2581 Secondary hyperparathyroidism of renal origin: Secondary | ICD-10-CM | POA: Diagnosis not present

## 2019-06-19 DIAGNOSIS — N186 End stage renal disease: Secondary | ICD-10-CM | POA: Diagnosis not present

## 2019-06-19 DIAGNOSIS — Z992 Dependence on renal dialysis: Secondary | ICD-10-CM | POA: Diagnosis not present

## 2019-06-20 ENCOUNTER — Ambulatory Visit (HOSPITAL_COMMUNITY): Payer: Medicare Other

## 2019-06-20 ENCOUNTER — Ambulatory Visit (HOSPITAL_COMMUNITY): Payer: Medicare Other | Admitting: Physical Therapy

## 2019-06-20 ENCOUNTER — Encounter (HOSPITAL_COMMUNITY): Payer: Self-pay

## 2019-06-20 ENCOUNTER — Ambulatory Visit (HOSPITAL_COMMUNITY): Payer: Medicare Other | Admitting: Speech Pathology

## 2019-06-20 ENCOUNTER — Encounter: Payer: Self-pay | Admitting: *Deleted

## 2019-06-21 ENCOUNTER — Encounter: Payer: Medicare Other | Admitting: Physical Medicine and Rehabilitation

## 2019-06-21 DIAGNOSIS — Z992 Dependence on renal dialysis: Secondary | ICD-10-CM | POA: Diagnosis not present

## 2019-06-21 DIAGNOSIS — N186 End stage renal disease: Secondary | ICD-10-CM | POA: Diagnosis not present

## 2019-06-21 DIAGNOSIS — N2581 Secondary hyperparathyroidism of renal origin: Secondary | ICD-10-CM | POA: Diagnosis not present

## 2019-06-24 DIAGNOSIS — N2581 Secondary hyperparathyroidism of renal origin: Secondary | ICD-10-CM | POA: Diagnosis not present

## 2019-06-24 DIAGNOSIS — N186 End stage renal disease: Secondary | ICD-10-CM | POA: Diagnosis not present

## 2019-06-24 DIAGNOSIS — Z992 Dependence on renal dialysis: Secondary | ICD-10-CM | POA: Diagnosis not present

## 2019-06-24 NOTE — Progress Notes (Signed)
Cardiology Office Note  Date: 06/25/2019   ID: Michael Jodoin., DOB 02/04/1956, [name] [id number]  PCP:  [name], MD  Cardiologist:  Ena Dawley, MD Electrophysiologist:  Cristopher Peru, MD   Chief Complaint  Patient presents with  . Follow-up    A flutter. s/p RF ablation, NIDCM, HTN, S/P cardiology     History of Present Illness: Michael Abarca. is a 64 y.o. male with a history of atrial tachycardia/flutter, ESRD on HD, hyperlipidemia, hypertension, NICM, OSA on CPAP, VT/PVCs.  Recent EP study and catheter ablation for history of atrial flutter by Dr. Lovena Le 04/11/2019.  Observed sinus node dysfunction post ablation, his BB was held and had improvement in his rate at his f/u 05/21/2019.  He was scheduled to have right AV fistula versus graft done by vascular surgery on 05/29/2019 however unfortunately, prior to incision he had a cardiac arrest in the operating room. Patient became hypotensive and hypoxic and ultimately got 4 minutes of CPR then ROSC. He was intubated. He was then admitted to ICU. Nephrology was consulted and patient continued to receive his hemodialysis. Subsequently was extubated on 06/01/2019 and was transferred to medical floor under hospital service starting 06/02/2019.   Recent echo 06/04/2019: LVEF less than 20%, severely dilated LV , global hypokinesis, grade 2 DD, LA severely dilated, RA severely dilated, mild TR, mild AR without AS, mild dilation of aortic root measuring 41 mm, normal PASP.  Recent RF ablation of atrial flutter with success by Dr. Lovena Le 04/11/2019.  Recent event monitor interpretation 05/23/2019:  NSR with sinus bradycardia. PAF with RVR and CVR  NSVT. No prolonged pausesGregg [name],M.D.  Renal function panel 06/10/2019: BUN 37, creatinine 10.66, GFR 5 CBC June 10, 2019: WBC 3.5, hemoglobin 13, hematocrit 42.9, platelets 145  He denies any issues status post hospital discharge. Denies any anginal or exertional symptoms,  palpitations or arrhythmias, orthostatic hypotension, syncopal or near syncopal episodes, PND, orthopnea, LE edema, claudication. He has two non functional bilateral upper arm AV fistulas.  Has permacath in left chest for dialysis. EKG today reviewed with Dr Lovena Le.  Lab Results  Component Value Date   NA 137 06/10/2019   BUN 37 (H) 06/10/2019   CREATININE 10.66 (H) 06/10/2019   TSH 0.34 (L) 07/16/2013   WBC 3.5 (L) 06/10/2019    Past Medical History:  Diagnosis Date  . Atrial arrhythmia    atrial tachycardia with variable AV conduction versus atypical aflutter 01/10/19, rate control (02/06/19)  . Cataract   . Dyspnea    on exertion  . ESRD (end stage renal disease) ([place])    Hemo- MWF, Polycystic kidney disease  . Fatigue   . History of kidney stones    removal of stone- cysto  . Hyperlipidemia   . Hyperparathyroidism, secondary renal ([place])   . Hypertension   . Hypoxemia 12/12/2013  . Nonischemic cardiomyopathy ([place])    Er 25% 2015, 55 % 2013  . OSA on CPAP    no longer using cpap  . OSA on CPAP 03/24/2014  . Pneumonia    2015ish  . Ventricular tachycardia//Freq PVCs   . Wears glasses     Past Surgical History:  Procedure Laterality Date  . A-FLUTTER ABLATION N/A 04/11/2019   Procedure: A-FLUTTER ABLATION;  Surgeon: Evans Lance, MD;  Location: [place] [facility];  Service: Cardiovascular;  Laterality: N/A;  . A/V FISTULAGRAM Left 04/27/2017   Procedure: A/V FISTULAGRAM;  Surgeon: Conrad White, MD;  Location: [place] [facility];  Service: Cardiovascular;  Laterality: Left;  lt arm  . A/V FISTULAGRAM Left 01/10/2019   Procedure: A/V FISTULAGRAM;  Surgeon: Marty Heck, MD;  Location: [place] [facility];  Service: Cardiovascular;  Laterality: Left;  . APPENDECTOMY    . AV FISTULA PLACEMENT  12/05/2011   Procedure: ARTERIOVENOUS (AV) FISTULA CREATION;LLEFT ARM  Surgeon: Conrad Grambling, MD;  Location: [place];  Service: Vascular;  Laterality: Left;  RADIO-CEPHALIC   fistula left arm  . AV FISTULA PLACEMENT  01/11/2012   Procedure: ARTERIOVENOUS (AV) FISTULA CREATION;  Surgeon: Conrad DeCordova, MD;  Location: [place];  Service: Vascular;  Laterality: Left;  Creation of left brachial cephalic arteriovenous fistula  . AV FISTULA PLACEMENT Right 03/07/2019   Procedure: ARTERIOVENOUS (AV) FISTULA CREATION  RIGHT ARM;  Surgeon: Marty Heck, MD;  Location: [place];  Service: Vascular;  Laterality: Right;  . BASCILIC VEIN TRANSPOSITION Left 12/27/2016   Procedure: BASILIC VEIN TRANSPOSITION LEFT UPPER ARM FIRST STAGE;  Surgeon: Conrad Atchison, MD;  Location: [place];  Service: Vascular;  Laterality: Left;  . BASCILIC VEIN TRANSPOSITION Left 01/31/2017   Procedure: LEFT ARM BASILIC VEIN TRANSPOSITION, SECOND STAGE;  Surgeon: Conrad [place], MD;  Location: [place];  Service: Vascular;  Laterality: Left;  . CARDIAC CATHETERIZATION  04-05-2010   checking for blockage but none-[facility]  . COLONOSCOPY    . CYSTOSCOPY W/ STONE MANIPULATION     "laser once" (01/22/2013)  . HEMATOMA EVACUATION Left 05/09/2017   Procedure: EVACUATION HEMATOMA LEFT ARM;  Surgeon: Conrad Stark, MD;  Location: [place];  Service: Vascular;  Laterality: Left;  . HERNIA REPAIR    . INGUINAL HERNIA REPAIR Right 11/06/2015   Procedure: OPEN HERNIA REPAIR  RIGHT INGUINAL ADULT;  Surgeon: [name], MD;  Location: [facility];  Service: General;  Laterality: Right;  with MESH  . INSERTION OF DIALYSIS CATHETER Right 10/05/2016   Procedure: INSERTION OF right internal jugular DIALYSIS CATHETER;  Surgeon: [name], MD;  Location: [place];  Service: Vascular;  Laterality: Right;  . INSERTION OF MESH  03/20/2012   Procedure: INSERTION OF MESH;  UMB Surgeon: [name], MD;  Location: [place];  Service: General;  Laterality: N/A;  . INSERTION OF MESH N/A 01/22/2013   Procedure: INSERTION OF MESH;  Surgeon: [name], MD;  Location: [place];  Service: General;  Laterality: N/A;  . LAPAROTOMY  04/02/2012    Procedure: EXPLORATORY LAPAROTOMY;  Surgeon: [name], MD;  Location: [place];  Service: General;  Laterality: N/A;  Exploratory Laparotomy with resection of small intestine  . LEFT HEART CATHETERIZATION WITH CORONARY ANGIOGRAM N/A 05/14/2013   Procedure: LEFT HEART CATHETERIZATION WITH CORONARY ANGIOGRAM;  Surgeon: Sinclair Grooms, MD;  Location: [facility];  Service: Cardiovascular;  Laterality: N/A;  . LIGATION OF ARTERIOVENOUS  FISTULA Left 12/27/2016   Procedure: LIGATION/EXCISION OF LEFT UPPER ARM ARTERIOVENOUS  FISTULA;  EVACUATION OF HEMATOMA;  Surgeon: Conrad , MD;  Location: [place];  Service: Vascular;  Laterality: Left;  . LIGATION OF ARTERIOVENOUS  FISTULA Left 03/07/2019   Procedure: LIGATION OF ARTERIOVENOUS FISTULA  LEFT UPPER ARM;  Surgeon: Marty Heck, MD;  Location: [place];  Service: Vascular;  Laterality: Left;  . REVISON OF ARTERIOVENOUS FISTULA Left 10/05/2016   Procedure: REVISON OF left arm ARTERIOVENOUS FISTULA;  Surgeon: [name], MD;  Location: [place];  Service: Vascular;  Laterality: Left;  . TONSILLECTOMY    . UMBILICAL  HERNIA REPAIR  03/20/2012   Procedure: HERNIA REPAIR UMBILICAL ADULT;  Surgeon: [name], MD;  Location: [place];  Service: General;  Laterality: N/A;  . UMBILICAL HERNIA REPAIR  01/22/2013   preperitoneal open procedure due to significant adhesions/notes 01/22/2013  . VENTRAL HERNIA REPAIR N/A 01/22/2013   Procedure: ATTEMPTED LAPAROSCOPIC VENTRAL HERNIA CONVERTED TO OPEN;  Surgeon: [name], MD;  Location: [place];  Service: General;  Laterality: N/A;    Current Outpatient Medications  Medication Sig Dispense Refill  . acetaminophen (TYLENOL) 325 MG tablet Take 1-2 tablets (325-650 mg total) by mouth every 4 (four) hours as needed for mild pain.    [name] acyclovir (ZOVIRAX) 400 MG tablet Take 400 mg by mouth daily with lunch.     [name] aspirin EC 81 MG EC tablet Take 1 tablet (81 mg total) by mouth daily.    [name] atorvastatin  (LIPITOR) 40 MG tablet Take 40 mg by mouth at bedtime.     . dorzolamide-timolol (COSOPT) 22.3-6.8 MG/ML ophthalmic solution Place 1 drop into both eyes 2 (two) times daily. 10 mL 10  . multivitamin (RENA-VIT) TABS tablet Take 1 tablet by mouth at bedtime. 30 tablet 0  . nitroGLYCERIN (NITROSTAT) 0.4 MG SL tablet Place 1 tablet (0.4 mg total) under the tongue every 5 (five) minutes x 3 doses as needed for chest pain. 30 tablet 12  . pantoprazole (PROTONIX) 40 MG tablet Take 1 tablet (40 mg total) by mouth daily. 30 tablet 0  . prednisoLONE acetate (PRED FORTE) 1 % ophthalmic suspension Place 1 drop into the right eye daily at 12 noon.     . senna (SENOKOT) 8.6 MG TABS tablet Take 1 tablet (8.6 mg total) by mouth 2 (two) times daily as needed for mild constipation. 120 tablet 0  . sevelamer carbonate (RENVELA) 800 MG tablet Take 2 tablets (1,600 mg total) by mouth 3 (three) times daily with meals. 180 tablet 0  . metoprolol succinate (TOPROL XL) 25 MG 24 hr tablet Take 1 tablet (25 mg total) by mouth daily. 90 tablet 1   No current facility-administered medications for this visit.   Allergies:  Patient has no known allergies.   Social History: The patient  reports that he has never smoked. He has never used smokeless tobacco. He reports that he does not drink alcohol or use drugs.   Family History: The patient's family history includes Heart disease in his mother; Hyperlipidemia in his mother; Hypertension in his mother; Kidney disease in his brother and father; Stroke in his father.   ROS:  Please see the history of present illness. Otherwise, complete review of systems is positive for none.  All other systems are reviewed and negative.   Physical Exam: VS:  BP (!) 150/110   Pulse (!) 108   Ht 5\' 6"  (1.676 m)   Wt 173 lb 12.8 oz (78.8 kg)   SpO2 91%   BMI 28.05 kg/m , BMI Body mass index is 28.05 kg/m.  Wt Readings from Last 3 Encounters:  06/25/19 173 lb 12.8 oz (78.8 kg)  06/11/19  172 lb 6.4 oz (78.2 kg)  06/04/19 176 lb 12.9 oz (80.2 kg)    General: Patient appears comfortable at rest. Neck: Supple, no elevated JVP or carotid bruits, no thyromegaly. Lungs: Clear to auscultation, nonlabored breathing at rest. Cardiac: Regular rate and rhythm, no S3 or significant systolic murmur, no pericardial rub. Extremities: No pitting edema, distal pulses 2+. Skin: Warm and dry. Musculoskeletal: No kyphosis. Neuropsychiatric: Alert  and oriented x3, affect grossly appropriate.  ECG:  An ECG dated 2.23.2021 was personally reviewed today and demonstrated:  NSR 92 with PVCs. No ST/T wave abnormalities noted. LAD  Recent Labwork: 06/01/2019: ALT 6; AST 38 06/02/2019: Magnesium 2.4 06/10/2019: BUN 37; Creatinine, Ser 10.66; Hemoglobin 13.0; Platelets 145; Potassium 4.6; Sodium 137     Component Value Date/Time   TRIG 105 06/01/2019 0418    Other Studies Reviewed Today:   Event Monitor Summary 05/23/2019  1. NSR with sinus bradycardia 2. PAF with RVR and CVR 3. NSVT 4. No prolonged pauses Michael [name],M.D.  Echocardiogram 06/04/2019 1. Left ventricular ejection fraction, by visual estimation, is <20%. The left ventricle has severely decreased function. There is no left ventricular hypertrophy. 2. Severely dilated left ventricular internal cavity size. 3. The left ventricle demonstrates global hypokinesis. 4. Left ventricular diastolic parameters are consistent with Grade II diastolic dysfunction (pseudonormalization). 5. Global right ventricle has normal systolicnction.The right ventricular size is normal. No increase in right ventricular wall thickness. 6. Left atrial size was severely dilated. 7. Right atrial size was severely dilated. 8. The mitral valve is normal in structure. Trivial mitral valve regurgitation. No evidence of mitral stenosis. 9. The tricuspid valve is normal in structure. Tricuspid valve regurgitation is mild. 10. The aortic valve is tricuspid.  Aortic valve regurgitation is mild. Moderate aortic valve sclerosis/calcification without any evidence of aortic stenosis. 11. The pulmonic valve was normal in structure. Pulmonic valve regurgitation is not visualized. 12. There is mild dilatation of the aortic root measuring 41 mm. 13. Normal pulmonary artery systolic pressure. 14. The inferior vena cava is dilated in size with >50% respiratory variability, suggesting right atrial pressure of 8 mmHg. 15. The tricuspid regurgitant velocity is 2.04 m/s, and with an assumed right atrial pressure of 26mmHg, the estimated right ventricular systolic pressure is normal at 24.6 mmHg. 16. Small pericardial effusion. 17. The pericardial effusion is posterior to the left ventricle   Atrial flutter catheter ablation 04/11/2019 CONCLUSIONS:  1. Isthmus-dependent right atrial flutter upon presentation.  2. Successful radiofrequency ablation of atrial flutter along the cavotricuspid isthmus with complete bidirectional isthmus block achieved.  3. No inducible arrhythmias following ablation.  4. No early apparent complications.  Cristopher Peru, MD  3:53 PM 04/11/2019  - EKG 05/21/19 ([facility]): Per 05/21/19 note by Dr. Lovena Le, "EKG- atrial tachycardia at 140/min with variable AV conduction" Ventricular rate was 96 bpm.   - EKG 03/26/19 ([facility]): "EKG- atrial flutter/tachycardia with variable AV but mostly 2:1 conduction"  - EKG 01/10/19: Atypical atrial flutter with premature ventricular or aberrantly conducted complexes Left axis deviation Non-specific intra-ventricular conduction block Inferior infarct , age undetermined Abnormal ECG Since last tracing Atrial flutter NOW PRESENT Confirmed by Skeet Latch [phone]) on 01/11/2019 3:35:42 PM - Per Cristopher Peru, MD, "Probable atrial tachycardia with variable AV conduction."  Echo 07/23/13: - Left ventricle: The cavity size was mildly dilated. Wall thickness was normal. Systolic  function was severely reduced. The estimated ejection fraction was in the range of 25% to 30%. There is akinesis of the posterior myocardium. There is severe hypokinesis of the lateral myocardium. There is hypokinesis of the anterior myocardium. Doppler parameters are consistent with abnormal left ventricular relaxation (grade 1 diastolic dysfunction). - Aortic valve: Mild regurgitation. - Aortic root: The aortic root was mildly dilated. - Left atrium: The atrium was mildly dilated.  Cardiac cath 05/14/13: 1. Nonischemic cardiomyopathywith reduced EF less than 25% and severely elevated left ventricular end-diastolic pressures 2. Widely patent coronary  arteries  Assessment and Plan:  1. Atrial flutter, unspecified type ([place])   2. Nonischemic cardiomyopathy ([place])   3. ESRD on dialysis ([place])   4. Cardiac arrest ([place])   5. Essential hypertension      1. Atrial flutter, unspecified type [facility]) Recent successful catheter ablation on 04/11/2019 for atrial flutter by Dr Lovena Le. F/U with Dr Lovena Le at scheduled appointment in March. Denies any palpitations.   2. Nonischemic cardiomyopathy ([place])  Left ventricular ejection fraction, by visual estimation, is <20%. The left ventricle has severely decreased function. Severely dil. LV. Global hypokinesis, LA and RA severely dilated. Attempted to increase Toprol dosage to 25 mg. Patient prefers not to proceed. He denies any DOE, orthostasis,  LE edema, or weight gain.  3. ESRD on dialysis [facility]) Recently scheduled to have AV Fistula vs Graft Sx interrupted by cardiac arrest. He has an indwelling perma-cath in right chest.   4. Cardiac arrest [facility]) Recent Cardiac arrest during pre procedure period for AV fistula vs. Graft surgery. 05/29/2019 Patient states he thinks the "anesthesia he was given which he believes caused the episode" He came hypotensive and hypoxic and was resuscitated for 4-5 minutes with ROSC.  He was intubated and sent to ICU.   5.  Essential hypertension  BP 110 -120 / 80 today on recheck.  Patient states his blood pressures are in the 118/120    Medication Adjustments/Labs and Tests Ordered: Current medicines are reviewed at length with the patient today.  Concerns regarding medicines are outlined above.     Signed, Michael July, NP 06/25/2019 4:45 PM    [facility]

## 2019-06-25 ENCOUNTER — Encounter: Payer: Self-pay | Admitting: Physician Assistant

## 2019-06-25 ENCOUNTER — Ambulatory Visit (INDEPENDENT_AMBULATORY_CARE_PROVIDER_SITE_OTHER): Payer: Medicare Other | Admitting: Family Medicine

## 2019-06-25 ENCOUNTER — Other Ambulatory Visit: Payer: Self-pay

## 2019-06-25 ENCOUNTER — Ambulatory Visit: Payer: Medicare Other | Admitting: Vascular Surgery

## 2019-06-25 VITALS — BP 120/80 | HR 108 | Ht 66.0 in | Wt 173.8 lb

## 2019-06-25 DIAGNOSIS — I469 Cardiac arrest, cause unspecified: Secondary | ICD-10-CM

## 2019-06-25 DIAGNOSIS — I4892 Unspecified atrial flutter: Secondary | ICD-10-CM

## 2019-06-25 DIAGNOSIS — Z992 Dependence on renal dialysis: Secondary | ICD-10-CM | POA: Diagnosis not present

## 2019-06-25 DIAGNOSIS — N186 End stage renal disease: Secondary | ICD-10-CM | POA: Diagnosis not present

## 2019-06-25 DIAGNOSIS — I428 Other cardiomyopathies: Secondary | ICD-10-CM

## 2019-06-25 DIAGNOSIS — I10 Essential (primary) hypertension: Secondary | ICD-10-CM | POA: Diagnosis not present

## 2019-06-25 MED ORDER — METOPROLOL SUCCINATE ER 25 MG PO TB24: 25.0000 mg | ORAL_TABLET | Freq: Every day | ORAL | 1 refills | Status: DC

## 2019-06-25 NOTE — Patient Instructions (Addendum)
Medication Instructions:  Your physician recommends that you continue on your current medications as directed. Please refer to the Current Medication list given to you today.   *If you need a refill on your cardiac medications before your next appointment, please call your pharmacy*  Lab Work: None ordered  If you have labs (blood work) drawn today and your tests are completely normal, you will receive your results only by: [name] MyChart Message (if you have MyChart) OR . A paper copy in the mail If you have any lab test that is abnormal or we need to change your treatment, we will call you to review the results.  Testing/Procedures: None ordered  Follow-Up: At [facility], you and your health needs are our priority.  As part of our continuing mission to provide you with exceptional heart care, we have created designated Provider Care Teams.  These Care Teams include your primary Cardiologist (physician) and Advanced Practice Providers (APPs -  Physician Assistants and Nurse Practitioners) who all work together to provide you with the care you need, when you need it.  Your next appointment:   07/04/2019 SEE DR. Lovena Le AS PLANNED  Special Instructions:  Do not take your blood pressure medications ON DIALYSIS DAYS, UNTIL AFTER YOUR DIALYSIS

## 2019-06-26 DIAGNOSIS — N186 End stage renal disease: Secondary | ICD-10-CM | POA: Diagnosis not present

## 2019-06-26 DIAGNOSIS — Z992 Dependence on renal dialysis: Secondary | ICD-10-CM | POA: Diagnosis not present

## 2019-06-26 DIAGNOSIS — N2581 Secondary hyperparathyroidism of renal origin: Secondary | ICD-10-CM | POA: Diagnosis not present

## 2019-06-27 ENCOUNTER — Encounter (HOSPITAL_COMMUNITY): Payer: Self-pay

## 2019-06-27 ENCOUNTER — Ambulatory Visit (HOSPITAL_COMMUNITY): Payer: Medicare Other | Attending: Physical Medicine and Rehabilitation

## 2019-06-27 ENCOUNTER — Other Ambulatory Visit: Payer: Self-pay

## 2019-06-27 ENCOUNTER — Encounter (HOSPITAL_COMMUNITY): Payer: Self-pay | Admitting: Speech Pathology

## 2019-06-27 ENCOUNTER — Ambulatory Visit (HOSPITAL_COMMUNITY): Payer: Medicare Other | Admitting: Speech Pathology

## 2019-06-27 ENCOUNTER — Encounter (HOSPITAL_COMMUNITY): Payer: Self-pay | Admitting: Physical Therapy

## 2019-06-27 ENCOUNTER — Ambulatory Visit (HOSPITAL_COMMUNITY): Payer: Medicare Other | Admitting: Physical Therapy

## 2019-06-27 DIAGNOSIS — R262 Difficulty in walking, not elsewhere classified: Secondary | ICD-10-CM | POA: Insufficient documentation

## 2019-06-27 DIAGNOSIS — G931 Anoxic brain damage, not elsewhere classified: Secondary | ICD-10-CM | POA: Insufficient documentation

## 2019-06-27 DIAGNOSIS — R29898 Other symptoms and signs involving the musculoskeletal system: Secondary | ICD-10-CM | POA: Diagnosis not present

## 2019-06-27 DIAGNOSIS — R41841 Cognitive communication deficit: Secondary | ICD-10-CM | POA: Insufficient documentation

## 2019-06-27 NOTE — Therapy (Signed)
[place], [place], [address] Phone: [phone]   Fax:  [phone]  Occupational Therapy Evaluation  Patient Details  Name: Michael [name]. [name]: [id number] Date of Birth: August 09, 1955 Referring Provider (OT): Reesa Chew, [place]   Encounter Date: 06/27/2019  OT End of Session - 06/27/19 1432    Visit Number  1    Number of Visits  1    Authorization Type  1) Medicare 2) AARP    Authorization Time Period  visits limit for OT/PT/SLP: 47    Authorization - Visit Number  1    Authorization - Number of Visits  30    OT Start Time  1300    OT Stop Time  1327    OT Time Calculation (min)  27 min    Activity Tolerance  Patient tolerated treatment well    Behavior During Therapy  WFL for tasks assessed/performed       Past Medical History:  Diagnosis Date  . Atrial arrhythmia    atrial tachycardia with variable AV conduction versus atypical aflutter 01/10/19, rate control (02/06/19)  . Cataract   . Dyspnea    on exertion  . ESRD (end stage renal disease) ([facility])    Hemo- MWF, Polycystic kidney disease  . Fatigue   . History of kidney stones    removal of stone- cysto  . Hyperlipidemia   . Hyperparathyroidism, secondary renal ([place])   . Hypertension   . Hypoxemia 12/12/2013  . Nonischemic cardiomyopathy ([place])    Er 25% 2015, 55 % 2013  . OSA on CPAP    no longer using cpap  . OSA on CPAP 03/24/2014  . Pneumonia    2015ish  . Ventricular tachycardia//Freq PVCs   . Wears glasses     Past Surgical History:  Procedure Laterality Date  . A-FLUTTER ABLATION N/A 04/11/2019   Procedure: A-FLUTTER ABLATION;  Surgeon: Evans Lance, MD;  Location: [facility];  Service: Cardiovascular;  Laterality: N/A;  . A/V FISTULAGRAM Left 04/27/2017   Procedure: A/V FISTULAGRAM;  Surgeon: Conrad Hayti, MD;  Location: [facility];  Service: Cardiovascular;  Laterality: Left;  lt arm  . A/V FISTULAGRAM Left  01/10/2019   Procedure: A/V FISTULAGRAM;  Surgeon: Marty Heck, MD;  Location: [facility];  Service: Cardiovascular;  Laterality: Left;  . APPENDECTOMY    . AV FISTULA PLACEMENT  12/05/2011   Procedure: ARTERIOVENOUS (AV) FISTULA CREATION;LLEFT ARM  Surgeon: Conrad Escudilla Bonita, MD;  Location: [facility];  Service: Vascular;  Laterality: Left;  RADIO-CEPHALIC  fistula left arm  . AV FISTULA PLACEMENT  01/11/2012   Procedure: ARTERIOVENOUS (AV) FISTULA CREATION;  Surgeon: Conrad Indian Creek, MD;  Location: [facility];  Service: Vascular;  Laterality: Left;  Creation of left brachial cephalic arteriovenous fistula  . AV FISTULA PLACEMENT Right 03/07/2019   Procedure: ARTERIOVENOUS (AV) FISTULA CREATION  RIGHT ARM;  Surgeon: Marty Heck, MD;  Location: [facility];  Service: Vascular;  Laterality: Right;  . BASCILIC VEIN TRANSPOSITION Left 12/27/2016   Procedure: BASILIC VEIN TRANSPOSITION LEFT UPPER ARM FIRST STAGE;  Surgeon: Conrad [place], MD;  Location: [facility];  Service: Vascular;  Laterality: Left;  . BASCILIC VEIN TRANSPOSITION Left 01/31/2017   Procedure: LEFT ARM BASILIC VEIN TRANSPOSITION, SECOND STAGE;  Surgeon: Conrad Dunlap, MD;  Location: [facility];  Service: Vascular;  Laterality: Left;  . CARDIAC CATHETERIZATION  04-05-2010   checking for blockage but  none-[facility]  . COLONOSCOPY    . CYSTOSCOPY W/ STONE MANIPULATION     "laser once" (01/22/2013)  . HEMATOMA EVACUATION Left 05/09/2017   Procedure: EVACUATION HEMATOMA LEFT ARM;  Surgeon: Conrad Sheridan, MD;  Location: [facility];  Service: Vascular;  Laterality: Left;  . HERNIA REPAIR    . INGUINAL HERNIA REPAIR Right 11/06/2015   Procedure: OPEN HERNIA REPAIR  RIGHT INGUINAL ADULT;  Surgeon: [name], MD;  [facility];  Service: General;  Laterality: Right;  with MESH  . INSERTION OF DIALYSIS CATHETER Right 10/05/2016   Procedure: INSERTION OF right internal jugular DIALYSIS CATHETER;  Surgeon: [name], MD;  Location: [facility];  Service: Vascular;   Laterality: Right;  . INSERTION OF MESH  03/20/2012   Procedure: INSERTION OF MESH;  UMB Surgeon: [name], MD;  Location: [facility];  Service: General;  Laterality: N/A;  . INSERTION OF MESH N/A 01/22/2013   Procedure: INSERTION OF MESH;  Surgeon: [name], MD;  Location: [facility];  Service: General;  Laterality: N/A;  . LAPAROTOMY  04/02/2012   Procedure: EXPLORATORY LAPAROTOMY;  Surgeon: [name], MD;  Location: [facility];  Service: General;  Laterality: N/A;  Exploratory Laparotomy with resection of small intestine  . LEFT HEART CATHETERIZATION WITH CORONARY ANGIOGRAM N/A 05/14/2013   Procedure: LEFT HEART CATHETERIZATION WITH CORONARY ANGIOGRAM;  Surgeon: Sinclair Grooms, MD;  Location: [facility];  Service: Cardiovascular;  Laterality: N/A;  . LIGATION OF ARTERIOVENOUS  FISTULA Left 12/27/2016   Procedure: LIGATION/EXCISION OF LEFT UPPER ARM ARTERIOVENOUS  FISTULA;  EVACUATION OF HEMATOMA;  Surgeon: Conrad Valley-[place], MD;  Location: [facility];  Service: Vascular;  Laterality: Left;  . LIGATION OF ARTERIOVENOUS  FISTULA Left 03/07/2019   Procedure: LIGATION OF ARTERIOVENOUS FISTULA  LEFT UPPER ARM;  Surgeon: Marty Heck, MD;  Location: [facility];  Service: Vascular;  Laterality: Left;  . REVISON OF ARTERIOVENOUS FISTULA Left 10/05/2016   Procedure: REVISON OF left arm ARTERIOVENOUS FISTULA;  Surgeon: [name], MD;  Location: [facility];  Service: Vascular;  Laterality: Left;  . TONSILLECTOMY    . UMBILICAL HERNIA REPAIR  03/20/2012   Procedure: HERNIA REPAIR UMBILICAL ADULT;  Surgeon: [name], MD;  Location: [facility];  Service: General;  Laterality: N/A;  . UMBILICAL HERNIA REPAIR  01/22/2013   preperitoneal open procedure due to significant adhesions/notes 01/22/2013  . VENTRAL HERNIA REPAIR N/A 01/22/2013   Procedure: ATTEMPTED LAPAROSCOPIC VENTRAL HERNIA CONVERTED TO OPEN;  Surgeon: [name], MD;  Location: [facility];  Service: General;  Laterality: N/A;    There  were no vitals filed for this visit.  Subjective Assessment - 06/27/19 1424    Subjective   S: I need to gain weight. i've lost some since I've been in the hospital.    Pertinent History  Patient is a 64 y/o male was admitted to acute on 05/29/2019 for AV fistula versus graft placement due to problems with prior access. Prior to initiation of procedure patient sustained cardiac arrest requiring CPR x4 minutes. EEG done showing diffuse encephalopathy. MRI brain done showing mild brain atrophy and anoxic brain injury. Reesa Chew, PA has referred patient to occupational therapy for evaluation and treatment.    Patient Stated Goals  None stated.    Currently in Pain?  No/denies        [facility] OT Assessment - 06/27/19 1306      Assessment   Medical Diagnosis  Anoxic Brain Injury  Referring Provider (OT)  Reesa Chew, PA    Onset Date/Surgical Date  05/29/19    Hand Dominance  Right    Next MD Visit  07/04/19   Dr. Posey Pronto   Prior Therapy  [facility]      Precautions   Precautions  Other (comment)    Precaution Comments  Per D/C summary from acute: no strenous activities or driving until cleared by Cardiology.      Restrictions   Weight Bearing Restrictions  No      Balance Screen   Has the patient fallen in the past 6 months  No      Home  Environment   Family/patient expects to be discharged to:  Private residence    Living Arrangements  Spouse/significant other   24 y/o son      Prior Function   Level of Independence  Independent    Vocation  On disability    Leisure  Enjoys working on cars with his son.       ADL   ADL comments  Pt reports no difficulty with ADL tasks.      Mobility   Mobility Status  Independent      Written Expression   Dominant Hand  Right      Vision - History   Baseline Vision  Wears glasses only for reading      Cognition   Overall Cognitive Status  Within Functional Limits for tasks assessed      Coordination   Gross Motor Movements are Fluid and  Coordinated  Yes    Fine Motor Movements are Fluid and Coordinated  Yes    9 Hole Peg Test  Left;Right    Right 9 Hole Peg Test  22.0"    Left 9 Hole Peg Test  23.3"      ROM / Strength   AROM / PROM / Strength  AROM;Strength      AROM   Overall AROM   Within functional limits for tasks performed    Overall AROM Comments  BUE assessed. Shoulder, elbow, and wrist ranges.       Strength   Overall Strength Comments  Assessed seated. IR/er adducted    Strength Assessment Site  Shoulder;Hand    Right/Left Shoulder  Left;Right    Right Shoulder Flexion  5/5    Right Shoulder ABduction  5/5    Right Shoulder Internal Rotation  5/5    Right Shoulder External Rotation  5/5    Left Shoulder Flexion  5/5    Left Shoulder ABduction  5/5    Left Shoulder Internal Rotation  5/5    Left Shoulder External Rotation  5/5    Right/Left hand  Right;Left    Right Hand Grip (lbs)  80    Right Hand Lateral Pinch  29 lbs    Right Hand 3 Point Pinch  25 lbs    Left Hand Grip (lbs)  80    Left Hand Lateral Pinch  18 lbs    Left Hand 3 Point Pinch  15 lbs                      OT Education - 06/27/19 1431    Education Details  Reviewed evaluation findings.    Person(s) Educated  Patient    Methods  Explanation    Comprehension  Verbalized understanding                 Plan - 06/27/19 1433  Clinical Impression Statement  A: Patient is a 64 y/o male referred for recent anoxic brain injury sustained during surgical procedure. Patient reports no deficits and is able to complete all daily tasks independently. ROM, Strength, and coordination was assessed without any concerns noted. Discussed evaluation findings with patient. Patient verbalized understanding of information. Patient does not require any follow up OT services at this time.    OT Occupational Profile and History  Problem Focused Assessment - Including review of records relating to presenting problem    Occupational  performance deficits (Please refer to evaluation for details):  --   N/A   Rehab Potential  Excellent    Clinical Decision Making  Limited treatment options, no task modification necessary    Comorbidities Affecting Occupational Performance:  May have comorbidities impacting occupational performance    Modification or Assistance to Complete Evaluation   No modification of tasks or assist necessary to complete eval    OT Frequency  One time visit    OT Treatment/Interventions  Other (comment)   N/A   Plan  P: No follow up OT services required at this time. patient will follow up with MD when scheduled.    OT Home Exercise Plan  No HEP needed.    Recommended Other Services  None    Consulted and Agree with Plan of Care  Patient       Patient will benefit from skilled therapeutic intervention in order to improve the following deficits and impairments:           Visit Diagnosis: Other symptoms and signs involving the musculoskeletal system - Plan: Ot plan of care cert/re-cert    Problem List Patient Active Problem List   Diagnosis Date Noted  . Leukopenia   . Thrombocytopenia ([place])   . Anemia of chronic disease   . History of ventricular tachycardia   . Anoxic brain injury ([facility]) 06/04/2019  . Status post creation of arteriovenous fistula 05/29/2019  . Cardiac arrest ([place]) 05/29/2019  . Unspecified atrial flutter ([place]) 05/21/2019  . Atrial tachycardia ([place]) 02/06/2019  . Pseudoaneurysm of AV hemodialysis fistula, initial encounter ([place]) 04/14/2017  . Status post right inguinal hernia repair-July 2017 11/06/2015  . ESRD on dialysis ([place]) 09/15/2015  . Hypersomnia with sleep apnea 09/15/2015  . Bradycardia, drug induced 09/15/2015  . OSA on CPAP 03/24/2014  . Hypoxemia 12/12/2013  . Hypersomnia with sleep apnea, unspecified 12/12/2013  . PVC's // RBBB sup Axis 05/15/2013  . Nonischemic cardiomyopathy ([place])   . VT (ventricular tachycardia) [facility]) 05/13/2013   Ailene Ravel, OTR/L,CBIS  [phone]  06/27/2019, 2:37 PM  [facility] [address] [place], [place], [address] Phone: [phone]   Fax:  [phone]  Name: Michael [name]. [name]: [id number] Date of Birth: [birth year]

## 2019-06-27 NOTE — Therapy (Signed)
[facility] [address] [place], [place], [address] Phone: [phone]   Fax:  [phone]  Physical Therapy Evaluation  Patient Details  Name: Michael [name]. [name]: [id number] Date of Birth: April 27, 1956 Referring Provider (PT): Reesa Chew   Encounter Date: 06/27/2019  PT End of Session - 06/27/19 1200    Visit Number  1    Number of Visits  1    Authorization Type  [employer] 30 VL PT/OT/SP    Authorization - Visit Number  1    Authorization - Number of Visits  1    PT Start Time  0915    PT Stop Time  0950    PT Time Calculation (min)  35 min    Activity Tolerance  Patient tolerated treatment well;No increased pain    Behavior During Therapy  WFL for tasks assessed/performed       Past Medical History:  Diagnosis Date  . Atrial arrhythmia    atrial tachycardia with variable AV conduction versus atypical aflutter 01/10/19, rate control (02/06/19)  . Cataract   . Dyspnea    on exertion  . ESRD (end stage renal disease) ([place])    Hemo- MWF, Polycystic kidney disease  . Fatigue   . History of kidney stones    removal of stone- cysto  . Hyperlipidemia   . Hyperparathyroidism, secondary renal ([place])   . Hypertension   . Hypoxemia 12/12/2013  . Nonischemic cardiomyopathy ([place])    Er 25% 2015, 55 % 2013  . OSA on CPAP    no longer using cpap  . OSA on CPAP 03/24/2014  . Pneumonia    2015ish  . Ventricular tachycardia//Freq PVCs   . Wears glasses     Past Surgical History:  Procedure Laterality Date  . A-FLUTTER ABLATION N/A 04/11/2019   Procedure: A-FLUTTER ABLATION;  Surgeon: Evans Lance, MD;  Location: [facility];  Service: Cardiovascular;  Laterality: N/A;  . A/V FISTULAGRAM Left 04/27/2017   Procedure: A/V FISTULAGRAM;  Surgeon: Conrad Plano, MD;  Location: [facility];  Service: Cardiovascular;  Laterality: Left;  lt arm  . A/V FISTULAGRAM Left 01/10/2019   Procedure: A/V FISTULAGRAM;  Surgeon: Marty Heck, MD;  Location: [facility];  Service: Cardiovascular;  Laterality: Left;  . APPENDECTOMY    . AV FISTULA PLACEMENT  12/05/2011   Procedure: ARTERIOVENOUS (AV) FISTULA CREATION;LLEFT ARM  Surgeon: Conrad [place] Place, MD;  Location: [facility];  Service: Vascular;  Laterality: Left;  RADIO-CEPHALIC  fistula left arm  . AV FISTULA PLACEMENT  01/11/2012   Procedure: ARTERIOVENOUS (AV) FISTULA CREATION;  Surgeon: Conrad Covington, MD;  Location: [facility];  Service: Vascular;  Laterality: Left;  Creation of left brachial cephalic arteriovenous fistula  . AV FISTULA PLACEMENT Right 03/07/2019   Procedure: ARTERIOVENOUS (AV) FISTULA CREATION  RIGHT ARM;  Surgeon: Marty Heck, MD;  Location: [facility];  Service: Vascular;  Laterality: Right;  . BASCILIC VEIN TRANSPOSITION Left 12/27/2016   Procedure: BASILIC VEIN TRANSPOSITION LEFT UPPER ARM FIRST STAGE;  Surgeon: Conrad Crook, MD;  Location: [facility];  Service: Vascular;  Laterality: Left;  . BASCILIC VEIN TRANSPOSITION Left 01/31/2017   Procedure: LEFT ARM BASILIC VEIN TRANSPOSITION, SECOND STAGE;  Surgeon: Conrad [place], MD;  Location: [facility];  Service: Vascular;  Laterality: Left;  . CARDIAC CATHETERIZATION  04-05-2010   checking for blockage but none-[facility]  . COLONOSCOPY    . CYSTOSCOPY W/ STONE  MANIPULATION     "laser once" (01/22/2013)  . HEMATOMA EVACUATION Left 05/09/2017   Procedure: EVACUATION HEMATOMA LEFT ARM;  Surgeon: Conrad Marineland, MD;  Location: [facility];  Service: Vascular;  Laterality: Left;  . HERNIA REPAIR    . INGUINAL HERNIA REPAIR Right 11/06/2015   Procedure: OPEN HERNIA REPAIR  RIGHT INGUINAL ADULT;  Surgeon: [name], MD;  [facility];  Service: General;  Laterality: Right;  with MESH  . INSERTION OF DIALYSIS CATHETER Right 10/05/2016   Procedure: INSERTION OF right internal jugular DIALYSIS CATHETER;  Surgeon: [name], MD;  Location: [facility] [place];  Service: Vascular;  Laterality: Right;  . INSERTION OF MESH  03/20/2012    Procedure: INSERTION OF MESH;  UMB Surgeon: [name], MD;  Location: [facility];  Service: General;  Laterality: N/A;  . INSERTION OF MESH N/A 01/22/2013   Procedure: INSERTION OF MESH;  Surgeon: [name], MD;  Location: [facility];  Service: General;  Laterality: N/A;  . LAPAROTOMY  04/02/2012   Procedure: EXPLORATORY LAPAROTOMY;  Surgeon: [name], MD;  Location: [facility];  Service: General;  Laterality: N/A;  Exploratory Laparotomy with resection of small intestine  . LEFT HEART CATHETERIZATION WITH CORONARY ANGIOGRAM N/A 05/14/2013   Procedure: LEFT HEART CATHETERIZATION WITH CORONARY ANGIOGRAM;  Surgeon: Sinclair Grooms, MD;  Location: [facility];  Service: Cardiovascular;  Laterality: N/A;  . LIGATION OF ARTERIOVENOUS  FISTULA Left 12/27/2016   Procedure: LIGATION/EXCISION OF LEFT UPPER ARM ARTERIOVENOUS  FISTULA;  EVACUATION OF HEMATOMA;  Surgeon: Conrad [place], MD;  Location: [facility];  Service: Vascular;  Laterality: Left;  . LIGATION OF ARTERIOVENOUS  FISTULA Left 03/07/2019   Procedure: LIGATION OF ARTERIOVENOUS FISTULA  LEFT UPPER ARM;  Surgeon: Marty Heck, MD;  Location: [facility];  Service: Vascular;  Laterality: Left;  . REVISON OF ARTERIOVENOUS FISTULA Left 10/05/2016   Procedure: REVISON OF left arm ARTERIOVENOUS FISTULA;  Surgeon: [name], MD;  Location: [facility];  Service: Vascular;  Laterality: Left;  . TONSILLECTOMY    . UMBILICAL HERNIA REPAIR  03/20/2012   Procedure: HERNIA REPAIR UMBILICAL ADULT;  Surgeon: [name], MD;  Location: [facility];  Service: General;  Laterality: N/A;  . UMBILICAL HERNIA REPAIR  01/22/2013   preperitoneal open procedure due to significant adhesions/notes 01/22/2013  . VENTRAL HERNIA REPAIR N/A 01/22/2013   Procedure: ATTEMPTED LAPAROSCOPIC VENTRAL HERNIA CONVERTED TO OPEN;  Surgeon: [name], MD;  Location: [facility];  Service: General;  Laterality: N/A;    There were no vitals filed for this visit.   Subjective  Assessment - 06/27/19 1211    Subjective  Patient reports he was very weak coming out of the hospital but feels he has gotten stronger and stronger. States he has been trying to increase what he does daily and he is no longer using a cane to walk around. States he has no pain and doesn't feel he has any balance or strength deficits that need assistance. States he feels he can get where he needs to with just slowly increasing his activities at home.    Currently in Pain?  No/denies         [facility] PT Assessment - 06/27/19 0001      Assessment   Medical Diagnosis  ataxic gait    Referring Provider (PT)  Reesa Chew      Precautions   Precautions  None      Restrictions   Weight Bearing Restrictions  No      Balance Screen   Has the patient fallen in the past 6 months  No    Has the patient had a decrease in activity level because of a fear of falling?   Yes    Is the patient reluctant to leave their home because of a fear of falling?   No      Home Environment   Living Environment  Private residence    Living Arrangements  Spouse/significant other    Available Help at Discharge  Family    Type of [place] to enter    Entrance Stairs-Number of Steps  [place]  One level      Prior Function   Level of Independence  Independent      Cognition   Overall Cognitive Status  Within Functional Limits for tasks assessed      Transfers   Transfers  Sit to Stand;Stand to Sit    Sit to Stand  7: Independent;Without upper extremity assist    Five time sit to stand comments   14.85      Ambulation/Gait   Ambulation/Gait  Yes    Ambulation/Gait Assistance  7: Independent    Ambulation Distance (Feet)  388 Feet    Assistive device  None    Gait Pattern  Decreased step length - right;Decreased step length - left    Ambulation Surface  Level;Indoor    Gait Comments  2MW      Balance   Balance Assessed  Yes      Static Standing Balance   Static  Standing - Balance Support  No upper extremity supported    Static Standing - Level of Assistance  7: Independent    Static Standing Balance -  Activities   Single Leg Stance - Right Leg;Single Leg Stance - Left Leg;Tandam Stance - Right Leg;Tandam Stance - Left Leg    Static Standing - Comment/# of Minutes  30 seconds on all except riight SLS was 22 seconds      Standardized Balance Assessment   Standardized Balance Assessment  Dynamic Gait Index      Dynamic Gait Index   Level Surface  Normal    Change in Gait Speed  Normal    Gait with Horizontal Head Turns  Normal    Gait with Vertical Head Turns  Normal    Gait and Pivot Turn  Normal    Step Over Obstacle  Normal    Step Around Obstacles  Normal    Steps  Normal    Total Score  24      Functional Gait  Assessment   Gait assessed   Yes                Objective measurements completed on examination: See above findings.              PT Education - 06/27/19 0956    Education Details  walking program and STS at home, balance exercises at home    Person(s) Educated  Patient    Methods  Explanation    Comprehension  Verbalized understanding       PT Short Term Goals - 06/27/19 1203      PT SHORT TERM GOAL #1   Title  Patient will be independent in HEP to improve functional strength    Time  1    Period  Weeks  Status  New    Target Date  07/04/19                Plan - 06/27/19 1201    Clinical Impression Statement  Patient presents to therapy after hypoxic episode at hospital where he started having walking difficulties. Since the hospital he has improved in his functional mobility and presents to therapy without reported balance or strength deficits. Patient had no limitations on the DGI and no significant functional limitations on this date. Educated patient on things to work on at home to continue to gain functional strength and balance. At this time, patient does not need physical therapy  and instructed to follow up with MD if need arises.    Personal Factors and Comorbidities  Age;Comorbidity 1    Comorbidities  heart disease, renal disease    Examination-Activity Limitations  Locomotion Level    Examination-Participation Restrictions  Yard Work    Stability/Clinical Decision Making  Stable/Uncomplicated    Clinical Decision Making  Low    PT Frequency  1x / week    PT Duration  Other (comment)   1 week   PT Treatment/Interventions  ADLs/Self Care Home Management    PT Next Visit Plan  1 time visit - pt to work on HEP at home    PT Home Exercise Plan  walking program, STS, SLS    Consulted and Agree with Plan of Care  Patient       Patient will benefit from skilled therapeutic intervention in order to improve the following deficits and impairments:     Visit Diagnosis: Difficulty in walking, not elsewhere classified  Anoxic brain injury [facility])     Problem List Patient Active Problem List   Diagnosis Date Noted  . Leukopenia   . Thrombocytopenia ([place])   . Anemia of chronic disease   . History of ventricular tachycardia   . Anoxic brain injury ([place]) 06/04/2019  . Status post creation of arteriovenous fistula 05/29/2019  . Cardiac arrest ([place]) 05/29/2019  . Unspecified atrial flutter ([place]) 05/21/2019  . Atrial tachycardia ([place]) 02/06/2019  . Pseudoaneurysm of AV hemodialysis fistula, initial encounter ([place]) 04/14/2017  . Status post right inguinal hernia repair-July 2017 11/06/2015  . ESRD on dialysis ([place]) 09/15/2015  . Hypersomnia with sleep apnea 09/15/2015  . Bradycardia, drug induced 09/15/2015  . OSA on CPAP 03/24/2014  . Hypoxemia 12/12/2013  . Hypersomnia with sleep apnea, unspecified 12/12/2013  . PVC's // RBBB sup Axis 05/15/2013  . Nonischemic cardiomyopathy ([place])   . VT (ventricular tachycardia) ([place]) 05/13/2013   12:15 PM, 06/27/19 Jerene Pitch, DPT Physical Therapy with [facility]  [phone] office  [facility] [address] [place], [place], [address] Phone: [phone]   Fax:  [phone]  Name: Isabella Ida. [name]: [id number] Date of Birth: 1955-08-09

## 2019-06-27 NOTE — Therapy (Signed)
[place], [place], [address] Phone: [phone]   Fax:  [phone]  Speech Language Pathology Evaluation  Patient Details  Name: Michael [name]. [name]: [id number] Date of Birth: July 21, 1955 Referring Provider (SLP): Reesa Chew, PA-C   Encounter Date: 06/27/2019  End of Session - 06/27/19 1244    Visit Number  1    Number of Visits  1    Authorization Type  Medicare    SLP Start Time  3762    SLP Stop Time   1116    SLP Time Calculation (min)  41 min    Activity Tolerance  Patient tolerated treatment well       Past Medical History:  Diagnosis Date  . Atrial arrhythmia    atrial tachycardia with variable AV conduction versus atypical aflutter 01/10/19, rate control (02/06/19)  . Cataract   . Dyspnea    on exertion  . ESRD (end stage renal disease) ([place])    Hemo- MWF, Polycystic kidney disease  . Fatigue   . History of kidney stones    removal of stone- cysto  . Hyperlipidemia   . Hyperparathyroidism, secondary renal ([place])   . Hypertension   . Hypoxemia 12/12/2013  . Nonischemic cardiomyopathy ([place])    Er 25% 2015, 55 % 2013  . OSA on CPAP    no longer using cpap  . OSA on CPAP 03/24/2014  . Pneumonia    2015ish  . Ventricular tachycardia//Freq PVCs   . Wears glasses     Past Surgical History:  Procedure Laterality Date  . A-FLUTTER ABLATION N/A 04/11/2019   Procedure: A-FLUTTER ABLATION;  Surgeon: Evans Lance, MD;  Location: [facility];  Service: Cardiovascular;  Laterality: N/A;  . A/V FISTULAGRAM Left 04/27/2017   Procedure: A/V FISTULAGRAM;  Surgeon: Conrad Knightsen, MD;  Location: [facility];  Service: Cardiovascular;  Laterality: Left;  lt arm  . A/V FISTULAGRAM Left 01/10/2019   Procedure: A/V FISTULAGRAM;  Surgeon: Marty Heck, MD;  Location: [facility];  Service: Cardiovascular;  Laterality: Left;  . APPENDECTOMY    . AV FISTULA PLACEMENT  12/05/2011    Procedure: ARTERIOVENOUS (AV) FISTULA CREATION;LLEFT ARM  Surgeon: Conrad [place], MD;  Location: [facility];  Service: Vascular;  Laterality: Left;  RADIO-CEPHALIC  fistula left arm  . AV FISTULA PLACEMENT  01/11/2012   Procedure: ARTERIOVENOUS (AV) FISTULA CREATION;  Surgeon: Conrad [place], MD;  Location: [facility];  Service: Vascular;  Laterality: Left;  Creation of left brachial cephalic arteriovenous fistula  . AV FISTULA PLACEMENT Right 03/07/2019   Procedure: ARTERIOVENOUS (AV) FISTULA CREATION  RIGHT ARM;  Surgeon: Marty Heck, MD;  Location: [facility];  Service: Vascular;  Laterality: Right;  . BASCILIC VEIN TRANSPOSITION Left 12/27/2016   Procedure: BASILIC VEIN TRANSPOSITION LEFT UPPER ARM FIRST STAGE;  Surgeon: Conrad [place], MD;  Location: [facility];  Service: Vascular;  Laterality: Left;  . BASCILIC VEIN TRANSPOSITION Left 01/31/2017   Procedure: LEFT ARM BASILIC VEIN TRANSPOSITION, SECOND STAGE;  Surgeon: Conrad [place], MD;  Location: [facility];  Service: Vascular;  Laterality: Left;  . CARDIAC CATHETERIZATION  04-05-2010   checking for blockage but none-[facility]  . COLONOSCOPY    . CYSTOSCOPY W/ STONE MANIPULATION     "laser once" (01/22/2013)  . HEMATOMA EVACUATION Left 05/09/2017   Procedure: EVACUATION HEMATOMA LEFT ARM;  Surgeon: Conrad [place], MD;  Location: [facility];  Service:  Vascular;  Laterality: Left;  . HERNIA REPAIR    . INGUINAL HERNIA REPAIR Right 11/06/2015   Procedure: OPEN HERNIA REPAIR  RIGHT INGUINAL ADULT;  Surgeon: [name], MD;  Location: [facility];  Service: General;  Laterality: Right;  with MESH  . INSERTION OF DIALYSIS CATHETER Right 10/05/2016   Procedure: INSERTION OF right internal jugular DIALYSIS CATHETER;  Surgeon: [name], MD;  Location: [facility];  Service: Vascular;  Laterality: Right;  . INSERTION OF MESH  03/20/2012   Procedure: INSERTION OF MESH;  UMB Surgeon: [name], MD;  Location: [facility];  Service: General;  Laterality: N/A;  . INSERTION OF MESH N/A  01/22/2013   Procedure: INSERTION OF MESH;  Surgeon: [name], MD;  Location: [facility];  Service: General;  Laterality: N/A;  . LAPAROTOMY  04/02/2012   Procedure: EXPLORATORY LAPAROTOMY;  Surgeon: [name], MD;  Location: [facility];  Service: General;  Laterality: N/A;  Exploratory Laparotomy with resection of small intestine  . LEFT HEART CATHETERIZATION WITH CORONARY ANGIOGRAM N/A 05/14/2013   Procedure: LEFT HEART CATHETERIZATION WITH CORONARY ANGIOGRAM;  Surgeon: Sinclair Grooms, MD;  Location: [facility];  Service: Cardiovascular;  Laterality: N/A;  . LIGATION OF ARTERIOVENOUS  FISTULA Left 12/27/2016   Procedure: LIGATION/EXCISION OF LEFT UPPER ARM ARTERIOVENOUS  FISTULA;  EVACUATION OF HEMATOMA;  Surgeon: Conrad [place], MD;  Location: [facility];  Service: Vascular;  Laterality: Left;  . LIGATION OF ARTERIOVENOUS  FISTULA Left 03/07/2019   Procedure: LIGATION OF ARTERIOVENOUS FISTULA  LEFT UPPER ARM;  Surgeon: Marty Heck, MD;  Location: [facility];  Service: Vascular;  Laterality: Left;  . REVISON OF ARTERIOVENOUS FISTULA Left 10/05/2016   Procedure: REVISON OF left arm ARTERIOVENOUS FISTULA;  Surgeon: [name], MD;  Location: [facility];  Service: Vascular;  Laterality: Left;  . TONSILLECTOMY    . UMBILICAL HERNIA REPAIR  03/20/2012   Procedure: HERNIA REPAIR UMBILICAL ADULT;  Surgeon: [name], MD;  Location: [facility];  Service: General;  Laterality: N/A;  . UMBILICAL HERNIA REPAIR  01/22/2013   preperitoneal open procedure due to significant adhesions/notes 01/22/2013  . VENTRAL HERNIA REPAIR N/A 01/22/2013   Procedure: ATTEMPTED LAPAROSCOPIC VENTRAL HERNIA CONVERTED TO OPEN;  Surgeon: [name], MD;  Location: [facility];  Service: General;  Laterality: N/A;    There were no vitals filed for this visit.  Subjective Assessment - 06/27/19 1237    Subjective  "I feel fine. I think they gave me too much anesthesia."    Special Tests  SLUMS    Currently in Pain?   No/denies         SLP Evaluation [facility] - 06/27/19 1237      SLP Visit Information   SLP Received On  06/27/19    Referring Provider (SLP)  Reesa Chew, PA-C    Onset Date  05/29/2019    Medical Diagnosis  anoxic encephalopathy      Subjective   Patient/Family Stated Goal  Pt indicates he does not need therapy      General Information   HPI  Michael [name]. is a 64 y.o. male with history of OSA, retinopathy, and CIM, VT/a flutter s/p ablation 04/2019, ESRD-hemodialysis was admitted on 05/29/2019 for AV fistula versus graft placement due to problems with prior access.  Prior to initiation of procedure patient sustained cardiac arrest requiring CPR x4 minutes with ACLS protocol prior to ROSC.  Arrest felt to be respiratory in nature due to sedatives  and rapid decline due to compromised RV function as bedside echo reported to show global hypokinesis with EF of 20%.  He underwent hemodialysis to optimize volume status.  EEG done showing diffuse encephalopathy.  MRI brain done showing mild brain atrophy and no signs of anoxic injury.  He has had issues with agitation as well as cognitive deficits with delayed responses, poor safety awareness, fatigue and unsteady gait affecting ADLs as well as mobility.  CIR was recommended due to deficits from anoxic brain injury and he attended from 06/04/19 to 06/11/19. Upon discharge, it was noted that he continued to have poor safety awareness and recall affecting attention thought organization and safety with high-level cognitive tasks.  SLUMS exam revealed score of 20/30.  He has made good gains during his rehab stay and supervision is recommended for safety due to cognitive deficits. He exhibited moderate cognitive deficits affecting recall, memory, process, problem solving and had no awareness of deficits. Speech was 80% intelligible and he required more than reasonable amount of time to follow commands and formulate answers. He was referred by Reesa Chew, PA-C  for outpatient SLE.     Behavioral/Cognition  alert and cooperative    Mobility Status  ambulatory      Balance Screen   Has the patient fallen in the past 6 months  No    Has the patient had a decrease in activity level because of a fear of falling?   No    Is the patient reluctant to leave their home because of a fear of falling?   No      Prior Functional Status   Cognitive/Linguistic Baseline  Information not available    Type of Home  House     Lives With  Spouse    Available Support  Family    Education  2 years community college, trade school    Vocation  On disability      Cognition   Overall Cognitive Status  Impaired/Different from baseline    Area of Impairment  Attention;Awareness    Current Attention Level  Selective    Awareness  Emergent    Memory  Appears intact    Awareness  Impaired    Awareness Impairment  Emergent impairment    Problem Solving  Appears intact    Executive Function  Self Monitoring   planning   Self Monitoring  Impaired    Self Monitoring Impairment  Functional complex      Auditory Comprehension   Overall Auditory Comprehension  Appears within functional limits for tasks assessed    Yes/No Questions  Within Functional Limits    Commands  Within Functional Limits    Conversation  Complex    Interfering Components  Attention    EffectiveTechniques  Repetition      Visual Recognition/Discrimination   Discrimination  Within Function Limits      Reading Comprehension   Reading Status  Not tested      Expression   Primary Mode of Expression  Verbal      Verbal Expression   Overall Verbal Expression  Appears within functional limits for tasks assessed    Initiation  No impairment    Repetition  No impairment    Naming  No impairment    Pragmatics  No impairment      Written Expression   Dominant Hand  Right    Written Expression  Not tested      Oral Motor/Sensory Function   Overall Oral Motor/Sensory Function  Appears within  functional limits for tasks assessed      Motor Speech   Overall Motor Speech  Appears within functional limits for tasks assessed    Respiration  Within functional limits    Phonation  Normal    Resonance  Within functional limits    Articulation  Within functional limitis    Motor Planning  Witnin functional limits    Motor Speech Errors  Not applicable    Phonation  WFL      Standardized Assessments   Standardized Assessments   --   SLUMS ([place] Mental Status); 19/30        SLP Education - 06/27/19 1243    Education Details  Evaluation was reviewed with Pt; Memory and attention information/strategies provided    Person(s) Educated  Patient    Methods  Explanation;Handout    Comprehension  Verbalized understanding        Plan - 06/27/19 1245    Clinical Impression Statement  Pt presents with mild cognitive deficits characterized by reduced attention and reduced planning/executive function. Pt scored 19/30 on the SLUMS with errors in mental calculation, digits in reverse, and clock drawing. Pt indicates that he feels this is his baseline and that he is functioning independently at home with all previous activities. (driving, medication management, cooking, working on cars, watching TV, and spending time with his son). Pt receives dialysis three days a week. He was provided with written attention and memory strategies to take home for review. Pt does not wish to pursue therapy at this time. No further SLP services indicated at this time.    SLP Home Exercise Plan  N/A; evaluation only    Consulted and Agree with Plan of Care  Patient       Patient will benefit from skilled therapeutic intervention in order to improve the following deficits and impairments:   Cognitive communication deficit    Problem List Patient Active Problem List   Diagnosis Date Noted  . Leukopenia   . Thrombocytopenia ([place])   . Anemia of chronic disease   . History of ventricular  tachycardia   . Anoxic brain injury ([place]) 06/04/2019  . Status post creation of arteriovenous fistula 05/29/2019  . Cardiac arrest ([place]) 05/29/2019  . Unspecified atrial flutter ([place]) 05/21/2019  . Atrial tachycardia ([place]) 02/06/2019  . Pseudoaneurysm of AV hemodialysis fistula, initial encounter ([place]) 04/14/2017  . Status post right inguinal hernia repair-July 2017 11/06/2015  . ESRD on dialysis ([place]) 09/15/2015  . Hypersomnia with sleep apnea 09/15/2015  . Bradycardia, drug induced 09/15/2015  . OSA on CPAP 03/24/2014  . Hypoxemia 12/12/2013  . Hypersomnia with sleep apnea, unspecified 12/12/2013  . PVC's // RBBB sup Axis 05/15/2013  . Nonischemic cardiomyopathy ([place])   . VT (ventricular tachycardia) ([place]) 05/13/2013   Thank you,  Genene Churn, [place]  [facility] 06/27/2019, 1:04 PM  [facility] [address] [place], [place], [address] Phone: [phone]   Fax:  [phone]  Name: Michael [name]. [name]: [id number] Date of Birth: [birth year]

## 2019-06-27 NOTE — Patient Instructions (Signed)
walking program - start small - a few times a day and then build up length/time sit to stands- with chair up against wall - focus on controlled sit/stand - start with 5 but work up to more and do throughout the day practice balance single leg stance near sturdy surface -for safety.

## 2019-06-28 DIAGNOSIS — N2581 Secondary hyperparathyroidism of renal origin: Secondary | ICD-10-CM | POA: Diagnosis not present

## 2019-06-28 DIAGNOSIS — N186 End stage renal disease: Secondary | ICD-10-CM | POA: Diagnosis not present

## 2019-06-28 DIAGNOSIS — Z992 Dependence on renal dialysis: Secondary | ICD-10-CM | POA: Diagnosis not present

## 2019-07-01 ENCOUNTER — Telehealth: Payer: Self-pay | Admitting: Internal Medicine

## 2019-07-01 DIAGNOSIS — Q612 Polycystic kidney, adult type: Secondary | ICD-10-CM | POA: Diagnosis not present

## 2019-07-01 DIAGNOSIS — Z992 Dependence on renal dialysis: Secondary | ICD-10-CM | POA: Diagnosis not present

## 2019-07-01 DIAGNOSIS — N186 End stage renal disease: Secondary | ICD-10-CM | POA: Diagnosis not present

## 2019-07-01 DIAGNOSIS — N2581 Secondary hyperparathyroidism of renal origin: Secondary | ICD-10-CM | POA: Diagnosis not present

## 2019-07-01 MED ORDER — METOPROLOL SUCCINATE ER 25 MG PO TB24: 12.5000 mg | ORAL_TABLET | Freq: Every day | ORAL | 3 refills | Status: DC

## 2019-07-01 NOTE — Telephone Encounter (Signed)
Yes, please tell him to start the metoprolol. If he is only willing to take the 12.5 it is better than not taking it at all. Thanks

## 2019-07-01 NOTE — Telephone Encounter (Signed)
Pt aware and script sent to [facility] in [place]

## 2019-07-01 NOTE — Telephone Encounter (Signed)
Per pt has not been taking metoprolol 25 mg at all Pt is willing to resume Metoprolol 12.5 mg to see if tolerates Per pt B/P is creeping up and does not know what HR is B/P today was 143/99 .Will forward to [place] July NP for review and recommendations./cy

## 2019-07-01 NOTE — Telephone Encounter (Signed)
New Message:   Pt said he wanted to talk to a nurse about his medicine. He said he did not know the name of it, but the nurse would know once he talked to her.

## 2019-07-03 DIAGNOSIS — Z992 Dependence on renal dialysis: Secondary | ICD-10-CM | POA: Diagnosis not present

## 2019-07-03 DIAGNOSIS — N2581 Secondary hyperparathyroidism of renal origin: Secondary | ICD-10-CM | POA: Diagnosis not present

## 2019-07-03 DIAGNOSIS — N186 End stage renal disease: Secondary | ICD-10-CM | POA: Diagnosis not present

## 2019-07-04 ENCOUNTER — Ambulatory Visit: Payer: Medicare Other | Admitting: Internal Medicine

## 2019-07-04 ENCOUNTER — Encounter: Payer: Medicare Other | Attending: Physical Medicine & Rehabilitation | Admitting: Physical Medicine & Rehabilitation

## 2019-07-04 ENCOUNTER — Other Ambulatory Visit: Payer: Self-pay

## 2019-07-04 ENCOUNTER — Encounter (INDEPENDENT_AMBULATORY_CARE_PROVIDER_SITE_OTHER): Payer: Medicare Other | Admitting: Ophthalmology

## 2019-07-05 DIAGNOSIS — N186 End stage renal disease: Secondary | ICD-10-CM | POA: Diagnosis not present

## 2019-07-05 DIAGNOSIS — Z992 Dependence on renal dialysis: Secondary | ICD-10-CM | POA: Diagnosis not present

## 2019-07-05 DIAGNOSIS — N2581 Secondary hyperparathyroidism of renal origin: Secondary | ICD-10-CM | POA: Diagnosis not present

## 2019-07-09 DIAGNOSIS — Z992 Dependence on renal dialysis: Secondary | ICD-10-CM | POA: Diagnosis not present

## 2019-07-09 DIAGNOSIS — N186 End stage renal disease: Secondary | ICD-10-CM | POA: Diagnosis not present

## 2019-07-09 DIAGNOSIS — N2581 Secondary hyperparathyroidism of renal origin: Secondary | ICD-10-CM | POA: Diagnosis not present

## 2019-07-10 DIAGNOSIS — Z992 Dependence on renal dialysis: Secondary | ICD-10-CM | POA: Diagnosis not present

## 2019-07-10 DIAGNOSIS — N186 End stage renal disease: Secondary | ICD-10-CM | POA: Diagnosis not present

## 2019-07-10 DIAGNOSIS — N2581 Secondary hyperparathyroidism of renal origin: Secondary | ICD-10-CM | POA: Diagnosis not present

## 2019-07-11 DIAGNOSIS — Z23 Encounter for immunization: Secondary | ICD-10-CM | POA: Diagnosis not present

## 2019-07-12 DIAGNOSIS — Z992 Dependence on renal dialysis: Secondary | ICD-10-CM | POA: Diagnosis not present

## 2019-07-12 DIAGNOSIS — N2581 Secondary hyperparathyroidism of renal origin: Secondary | ICD-10-CM | POA: Diagnosis not present

## 2019-07-12 DIAGNOSIS — N186 End stage renal disease: Secondary | ICD-10-CM | POA: Diagnosis not present

## 2019-07-15 DIAGNOSIS — N186 End stage renal disease: Secondary | ICD-10-CM | POA: Diagnosis not present

## 2019-07-15 DIAGNOSIS — N2581 Secondary hyperparathyroidism of renal origin: Secondary | ICD-10-CM | POA: Diagnosis not present

## 2019-07-15 DIAGNOSIS — Z992 Dependence on renal dialysis: Secondary | ICD-10-CM | POA: Diagnosis not present

## 2019-07-16 ENCOUNTER — Encounter: Payer: Self-pay | Admitting: *Deleted

## 2019-07-17 DIAGNOSIS — Z992 Dependence on renal dialysis: Secondary | ICD-10-CM | POA: Diagnosis not present

## 2019-07-17 DIAGNOSIS — N2581 Secondary hyperparathyroidism of renal origin: Secondary | ICD-10-CM | POA: Diagnosis not present

## 2019-07-17 DIAGNOSIS — N186 End stage renal disease: Secondary | ICD-10-CM | POA: Diagnosis not present

## 2019-07-19 DIAGNOSIS — Z992 Dependence on renal dialysis: Secondary | ICD-10-CM | POA: Diagnosis not present

## 2019-07-19 DIAGNOSIS — N2581 Secondary hyperparathyroidism of renal origin: Secondary | ICD-10-CM | POA: Diagnosis not present

## 2019-07-19 DIAGNOSIS — N186 End stage renal disease: Secondary | ICD-10-CM | POA: Diagnosis not present

## 2019-07-20 DIAGNOSIS — Z992 Dependence on renal dialysis: Secondary | ICD-10-CM | POA: Diagnosis not present

## 2019-07-20 DIAGNOSIS — E8770 Fluid overload, unspecified: Secondary | ICD-10-CM | POA: Diagnosis not present

## 2019-07-20 DIAGNOSIS — N186 End stage renal disease: Secondary | ICD-10-CM | POA: Diagnosis not present

## 2019-07-20 DIAGNOSIS — N2581 Secondary hyperparathyroidism of renal origin: Secondary | ICD-10-CM | POA: Diagnosis not present

## 2019-07-22 DIAGNOSIS — Z992 Dependence on renal dialysis: Secondary | ICD-10-CM | POA: Diagnosis not present

## 2019-07-22 DIAGNOSIS — N186 End stage renal disease: Secondary | ICD-10-CM | POA: Diagnosis not present

## 2019-07-22 DIAGNOSIS — N2581 Secondary hyperparathyroidism of renal origin: Secondary | ICD-10-CM | POA: Diagnosis not present

## 2019-07-24 DIAGNOSIS — N2581 Secondary hyperparathyroidism of renal origin: Secondary | ICD-10-CM | POA: Diagnosis not present

## 2019-07-24 DIAGNOSIS — Z992 Dependence on renal dialysis: Secondary | ICD-10-CM | POA: Diagnosis not present

## 2019-07-24 DIAGNOSIS — N186 End stage renal disease: Secondary | ICD-10-CM | POA: Diagnosis not present

## 2019-07-26 DIAGNOSIS — N2581 Secondary hyperparathyroidism of renal origin: Secondary | ICD-10-CM | POA: Diagnosis not present

## 2019-07-26 DIAGNOSIS — N186 End stage renal disease: Secondary | ICD-10-CM | POA: Diagnosis not present

## 2019-07-26 DIAGNOSIS — Z992 Dependence on renal dialysis: Secondary | ICD-10-CM | POA: Diagnosis not present

## 2019-07-29 DIAGNOSIS — N2581 Secondary hyperparathyroidism of renal origin: Secondary | ICD-10-CM | POA: Diagnosis not present

## 2019-07-29 DIAGNOSIS — Z992 Dependence on renal dialysis: Secondary | ICD-10-CM | POA: Diagnosis not present

## 2019-07-29 DIAGNOSIS — N186 End stage renal disease: Secondary | ICD-10-CM | POA: Diagnosis not present

## 2019-07-31 DIAGNOSIS — Z992 Dependence on renal dialysis: Secondary | ICD-10-CM | POA: Diagnosis not present

## 2019-07-31 DIAGNOSIS — N186 End stage renal disease: Secondary | ICD-10-CM | POA: Diagnosis not present

## 2019-07-31 DIAGNOSIS — N2581 Secondary hyperparathyroidism of renal origin: Secondary | ICD-10-CM | POA: Diagnosis not present

## 2019-08-01 DIAGNOSIS — Q612 Polycystic kidney, adult type: Secondary | ICD-10-CM | POA: Diagnosis not present

## 2019-08-01 DIAGNOSIS — N186 End stage renal disease: Secondary | ICD-10-CM | POA: Diagnosis not present

## 2019-08-01 DIAGNOSIS — Z992 Dependence on renal dialysis: Secondary | ICD-10-CM | POA: Diagnosis not present

## 2019-08-02 DIAGNOSIS — Z992 Dependence on renal dialysis: Secondary | ICD-10-CM | POA: Diagnosis not present

## 2019-08-02 DIAGNOSIS — N186 End stage renal disease: Secondary | ICD-10-CM | POA: Diagnosis not present

## 2019-08-02 DIAGNOSIS — N2581 Secondary hyperparathyroidism of renal origin: Secondary | ICD-10-CM | POA: Diagnosis not present

## 2019-08-05 DIAGNOSIS — N2581 Secondary hyperparathyroidism of renal origin: Secondary | ICD-10-CM | POA: Diagnosis not present

## 2019-08-05 DIAGNOSIS — Z992 Dependence on renal dialysis: Secondary | ICD-10-CM | POA: Diagnosis not present

## 2019-08-05 DIAGNOSIS — N186 End stage renal disease: Secondary | ICD-10-CM | POA: Diagnosis not present

## 2019-08-07 ENCOUNTER — Other Ambulatory Visit: Payer: Self-pay

## 2019-08-07 ENCOUNTER — Ambulatory Visit (INDEPENDENT_AMBULATORY_CARE_PROVIDER_SITE_OTHER): Payer: Medicare Other | Admitting: Internal Medicine

## 2019-08-07 ENCOUNTER — Encounter: Payer: Self-pay | Admitting: Internal Medicine

## 2019-08-07 VITALS — BP 161/107 | HR 87 | Ht 66.0 in | Wt 170.0 lb

## 2019-08-07 DIAGNOSIS — Z992 Dependence on renal dialysis: Secondary | ICD-10-CM | POA: Diagnosis not present

## 2019-08-07 DIAGNOSIS — I428 Other cardiomyopathies: Secondary | ICD-10-CM | POA: Diagnosis not present

## 2019-08-07 DIAGNOSIS — I4892 Unspecified atrial flutter: Secondary | ICD-10-CM | POA: Diagnosis not present

## 2019-08-07 DIAGNOSIS — N2581 Secondary hyperparathyroidism of renal origin: Secondary | ICD-10-CM | POA: Diagnosis not present

## 2019-08-07 DIAGNOSIS — N186 End stage renal disease: Secondary | ICD-10-CM | POA: Diagnosis not present

## 2019-08-07 NOTE — Patient Instructions (Addendum)

## 2019-08-07 NOTE — Progress Notes (Signed)
HPI [name] returns today for followup. He is a pleasant 64 yo man with ESRD on HD, who was undergoing fistula revision when during anesthesia induction suffered a respiratory arrest. I do not see much detail regarding what happened but he did not appear to receive any shock or have any documented VF or VT. He has been dialyzed through an indewelling catheter in the right IJ. He denies chest pain or sob and feels well. He has not had syncope. He wore a cardiac monitor after his atrial flutter ablation which revealed NSR and NSVT. He was noted to have a very large RA and atrial flutter isthmus and was initially quite bradycardic after his ablation. He denies any bp or HR trouble in HD currently. No Known Allergies   Current Outpatient Medications  Medication Sig Dispense Refill  . acetaminophen (TYLENOL) 325 MG tablet Take 1-2 tablets (325-650 mg total) by mouth every 4 (four) hours as needed for mild pain.    [name] acyclovir (ZOVIRAX) 400 MG tablet Take 400 mg by mouth daily with lunch.     [name] aspirin EC 81 MG EC tablet Take 1 tablet (81 mg total) by mouth daily.    [name] atorvastatin (LIPITOR) 40 MG tablet Take 40 mg by mouth at bedtime.     . dorzolamide-timolol (COSOPT) 22.3-6.8 MG/ML ophthalmic solution Place 1 drop into both eyes 2 (two) times daily. 10 mL 10  . metoprolol succinate (TOPROL-XL) 25 MG 24 hr tablet Take 0.5 tablets (12.5 mg total) by mouth daily. 45 tablet 3  . multivitamin (RENA-VIT) TABS tablet Take 1 tablet by mouth at bedtime. 30 tablet 0  . nitroGLYCERIN (NITROSTAT) 0.4 MG SL tablet Place 1 tablet (0.4 mg total) under the tongue every 5 (five) minutes x 3 doses as needed for chest pain. 30 tablet 12  . pantoprazole (PROTONIX) 40 MG tablet Take 1 tablet (40 mg total) by mouth daily. 30 tablet 0  . prednisoLONE acetate (PRED FORTE) 1 % ophthalmic suspension Place 1 drop into the right eye daily at 12 noon.     . senna (SENOKOT) 8.6 MG TABS tablet Take 1 tablet (8.6 mg  total) by mouth 2 (two) times daily as needed for mild constipation. 120 tablet 0  . sevelamer carbonate (RENVELA) 800 MG tablet Take 2 tablets (1,600 mg total) by mouth 3 (three) times daily with meals. 180 tablet 0   No current facility-administered medications for this visit.     Past Medical History:  Diagnosis Date  . Atrial arrhythmia    atrial tachycardia with variable AV conduction versus atypical aflutter 01/10/19, rate control (02/06/19)  . Cataract   . Dyspnea    on exertion  . ESRD (end stage renal disease) ([place])    Hemo- MWF, Polycystic kidney disease  . Fatigue   . History of kidney stones    removal of stone- cysto  . Hyperlipidemia   . Hyperparathyroidism, secondary renal ([place])   . Hypertension   . Hypoxemia 12/12/2013  . Nonischemic cardiomyopathy ([place])    Er 25% 2015, 55 % 2013  . OSA on CPAP    no longer using cpap  . OSA on CPAP 03/24/2014  . Pneumonia    2015ish  . Ventricular tachycardia//Freq PVCs   . Wears glasses     ROS:   All systems reviewed and negative except as noted in the HPI.   Past Surgical History:  Procedure Laterality Date  . A-FLUTTER ABLATION N/A 04/11/2019  Procedure: A-FLUTTER ABLATION;  Surgeon: Evans Lance, MD;  Location: [facility];  Service: Cardiovascular;  Laterality: N/A;  . A/V FISTULAGRAM Left 04/27/2017   Procedure: A/V FISTULAGRAM;  Surgeon: Conrad Pittsburg, MD;  Location: [facility];  Service: Cardiovascular;  Laterality: Left;  lt arm  . A/V FISTULAGRAM Left 01/10/2019   Procedure: A/V FISTULAGRAM;  Surgeon: Marty Heck, MD;  Location: [facility];  Service: Cardiovascular;  Laterality: Left;  . APPENDECTOMY    . AV FISTULA PLACEMENT  12/05/2011   Procedure: ARTERIOVENOUS (AV) FISTULA CREATION;LLEFT ARM  Surgeon: Conrad Bannock, MD;  Location: [facility];  Service: Vascular;  Laterality: Left;  RADIO-CEPHALIC  fistula left arm  . AV FISTULA PLACEMENT  01/11/2012   Procedure: ARTERIOVENOUS  (AV) FISTULA CREATION;  Surgeon: Conrad Mystic, MD;  Location: [facility];  Service: Vascular;  Laterality: Left;  Creation of left brachial cephalic arteriovenous fistula  . AV FISTULA PLACEMENT Right 03/07/2019   Procedure: ARTERIOVENOUS (AV) FISTULA CREATION  RIGHT ARM;  Surgeon: Marty Heck, MD;  Location: [facility];  Service: Vascular;  Laterality: Right;  . BASCILIC VEIN TRANSPOSITION Left 12/27/2016   Procedure: BASILIC VEIN TRANSPOSITION LEFT UPPER ARM FIRST STAGE;  Surgeon: Conrad Belle Mead, MD;  Location: [facility];  Service: Vascular;  Laterality: Left;  . BASCILIC VEIN TRANSPOSITION Left 01/31/2017   Procedure: LEFT ARM BASILIC VEIN TRANSPOSITION, SECOND STAGE;  Surgeon: Conrad [place] Park, MD;  Location: [facility];  Service: Vascular;  Laterality: Left;  . CARDIAC CATHETERIZATION  04-05-2010   checking for blockage but none-[facility]  . COLONOSCOPY    . CYSTOSCOPY W/ STONE MANIPULATION     "laser once" (01/22/2013)  . HEMATOMA EVACUATION Left 05/09/2017   Procedure: EVACUATION HEMATOMA LEFT ARM;  Surgeon: Conrad Cutten, MD;  Location: [facility];  Service: Vascular;  Laterality: Left;  . HERNIA REPAIR    . INGUINAL HERNIA REPAIR Right 11/06/2015   Procedure: OPEN HERNIA REPAIR  RIGHT INGUINAL ADULT;  Surgeon: [name], MD;  Location: [facility];  Service: General;  Laterality: Right;  with MESH  . INSERTION OF DIALYSIS CATHETER Right 10/05/2016   Procedure: INSERTION OF right internal jugular DIALYSIS CATHETER;  Surgeon: [name], MD;  Location: [place];  Service: Vascular;  Laterality: Right;  . INSERTION OF MESH  03/20/2012   Procedure: INSERTION OF MESH;  UMB Surgeon: [name], MD;  Location: [facility] [facility];  Service: General;  Laterality: N/A;  . INSERTION OF MESH N/A 01/22/2013   Procedure: INSERTION OF MESH;  Surgeon: [name], MD;  Location: [facility];  Service: General;  Laterality: N/A;  . LAPAROTOMY  04/02/2012   Procedure: EXPLORATORY LAPAROTOMY;  Surgeon: [name], MD;  Location: [facility];   Service: General;  Laterality: N/A;  Exploratory Laparotomy with resection of small intestine  . LEFT HEART CATHETERIZATION WITH CORONARY ANGIOGRAM N/A 05/14/2013   Procedure: LEFT HEART CATHETERIZATION WITH CORONARY ANGIOGRAM;  Surgeon: Sinclair Grooms, MD;  Location: [facility];  Service: Cardiovascular;  Laterality: N/A;  . LIGATION OF ARTERIOVENOUS  FISTULA Left 12/27/2016   Procedure: LIGATION/EXCISION OF LEFT UPPER ARM ARTERIOVENOUS  FISTULA;  EVACUATION OF HEMATOMA;  Surgeon: Conrad Northport, MD;  Location: [place];  Service: Vascular;  Laterality: Left;  . LIGATION OF ARTERIOVENOUS  FISTULA Left 03/07/2019   Procedure: LIGATION OF ARTERIOVENOUS FISTULA  LEFT UPPER ARM;  Surgeon: Marty Heck, MD;  Location: [place];  Service: Vascular;  Laterality: Left;  . REVISON OF ARTERIOVENOUS FISTULA Left 10/05/2016   Procedure: REVISON OF left arm ARTERIOVENOUS FISTULA;  Surgeon: [name], MD;  Location: [place];  Service: Vascular;  Laterality: Left;  . TONSILLECTOMY    . UMBILICAL HERNIA REPAIR  03/20/2012   Procedure: HERNIA REPAIR UMBILICAL ADULT;  Surgeon: [name], MD;  Location: [place];  Service: General;  Laterality: N/A;  . UMBILICAL HERNIA REPAIR  01/22/2013   preperitoneal open procedure due to significant adhesions/notes 01/22/2013  . VENTRAL HERNIA REPAIR N/A 01/22/2013   Procedure: ATTEMPTED LAPAROSCOPIC VENTRAL HERNIA CONVERTED TO OPEN;  Surgeon: [name], MD;  Location: [facility];  Service: General;  Laterality: N/A;     Family History  Problem Relation Age of Onset  . Heart disease Mother   . Hyperlipidemia Mother   . Hypertension Mother   . Kidney disease Father   . Stroke Father   . Kidney disease Brother   . Amblyopia Neg Hx   . Blindness Neg Hx   . Cataracts Neg Hx   . Diabetes Neg Hx   . Glaucoma Neg Hx   . Macular degeneration Neg Hx   . Retinal detachment Neg Hx   . Strabismus Neg Hx   . Retinitis pigmentosa Neg Hx      Social History    Socioeconomic History  . Marital status: Married    Spouse name: [name]  . Number of children: 3  . Years of education: 71  . Highest education level: Not on file  Occupational History    Comment: disabled  Tobacco Use  . Smoking status: Never Smoker  . Smokeless tobacco: Never Used  Substance and Sexual Activity  . Alcohol use: No    Alcohol/week: 0.0 standard drinks  . Drug use: No  . Sexual activity: Yes  Other Topics Concern  . Not on file  Social History Narrative   Patient is married [place]) and lives at home with his wife and children.   Patient has three children.   Patient is in disability.   Patient has a high school education.   Patient is right-handed   Patient drinks very little soda.            Social Determinants of Health   Financial Resource Strain:   . Difficulty of Paying Living Expenses:   Food Insecurity:   . Worried About Charity fundraiser in the Last Year:   . Arboriculturist in the Last Year:   Transportation Needs:   . Film/video editor (Medical):   [name] Lack of Transportation (Non-Medical):   Physical Activity:   . Days of Exercise per Week:   . Minutes of Exercise per Session:   Stress:   . Feeling of Stress :   Social Connections:   . Frequency of Communication with Friends and Family:   . Frequency of Social Gatherings with Friends and Family:   . Attends Religious Services:   . Active Member of Clubs or Organizations:   . Attends Archivist Meetings:   [name] Marital Status:   Intimate Partner Violence:   . Fear of Current or Ex-Partner:   . Emotionally Abused:   [name] Physically Abused:   . Sexually Abused:      BP (!) 161/107   Pulse 87   Ht 5\' 6"  (1.676 m)   Wt 170 lb (77.1 kg)   SpO2 97%   BMI 27.44 kg/m   Physical Exam:  Well appearing NAD  HEENT: Unremarkable Neck:  No JVD, no thyromegally Lymphatics:  No adenopathy Back:  No CVA tenderness Lungs:  Clear with no wheezes HEART:  Regular rate rhythm, no  murmurs, no rubs, no clicks Abd:  soft, positive bowel sounds, no organomegally, no rebound, no guarding Ext:  2 plus pulses, no edema, no cyanosis, no clubbing Skin:  No rashes no nodules Neuro:  CN II through XII intact, motor grossly intact   Assess/Plan: 1. Atrial flutter - no evidence of recurrence. He will continue his current meds. 2. VT - he has a remote h/o this. He has had none recently. I discussed ICD insertion. Because he does not yet have a fistula, I do not think ICD insertion warranted as placement of an ICD, either standard or S-ICD would be frought with infectious risk. 3. ESRD - he will continue on HD. I encouraged him to consider having a fistula placed and he is considering.  4. Chronic systolic heart failure - his EF is 20%. He is on medical therapy and has class 2 symptoms.   Mikle Bosworth.D.

## 2019-08-09 DIAGNOSIS — Z992 Dependence on renal dialysis: Secondary | ICD-10-CM | POA: Diagnosis not present

## 2019-08-09 DIAGNOSIS — N2581 Secondary hyperparathyroidism of renal origin: Secondary | ICD-10-CM | POA: Diagnosis not present

## 2019-08-09 DIAGNOSIS — N186 End stage renal disease: Secondary | ICD-10-CM | POA: Diagnosis not present

## 2019-08-10 DIAGNOSIS — Z23 Encounter for immunization: Secondary | ICD-10-CM | POA: Diagnosis not present

## 2019-08-12 DIAGNOSIS — Z992 Dependence on renal dialysis: Secondary | ICD-10-CM | POA: Diagnosis not present

## 2019-08-12 DIAGNOSIS — N2581 Secondary hyperparathyroidism of renal origin: Secondary | ICD-10-CM | POA: Diagnosis not present

## 2019-08-12 DIAGNOSIS — N186 End stage renal disease: Secondary | ICD-10-CM | POA: Diagnosis not present

## 2019-08-14 DIAGNOSIS — N2581 Secondary hyperparathyroidism of renal origin: Secondary | ICD-10-CM | POA: Diagnosis not present

## 2019-08-14 DIAGNOSIS — N186 End stage renal disease: Secondary | ICD-10-CM | POA: Diagnosis not present

## 2019-08-14 DIAGNOSIS — Z992 Dependence on renal dialysis: Secondary | ICD-10-CM | POA: Diagnosis not present

## 2019-08-16 DIAGNOSIS — Z992 Dependence on renal dialysis: Secondary | ICD-10-CM | POA: Diagnosis not present

## 2019-08-16 DIAGNOSIS — N2581 Secondary hyperparathyroidism of renal origin: Secondary | ICD-10-CM | POA: Diagnosis not present

## 2019-08-16 DIAGNOSIS — N186 End stage renal disease: Secondary | ICD-10-CM | POA: Diagnosis not present

## 2019-08-19 DIAGNOSIS — N2581 Secondary hyperparathyroidism of renal origin: Secondary | ICD-10-CM | POA: Diagnosis not present

## 2019-08-19 DIAGNOSIS — Z992 Dependence on renal dialysis: Secondary | ICD-10-CM | POA: Diagnosis not present

## 2019-08-19 DIAGNOSIS — N186 End stage renal disease: Secondary | ICD-10-CM | POA: Diagnosis not present

## 2019-08-21 DIAGNOSIS — N186 End stage renal disease: Secondary | ICD-10-CM | POA: Diagnosis not present

## 2019-08-21 DIAGNOSIS — N2581 Secondary hyperparathyroidism of renal origin: Secondary | ICD-10-CM | POA: Diagnosis not present

## 2019-08-21 DIAGNOSIS — Z992 Dependence on renal dialysis: Secondary | ICD-10-CM | POA: Diagnosis not present

## 2019-08-23 DIAGNOSIS — N2581 Secondary hyperparathyroidism of renal origin: Secondary | ICD-10-CM | POA: Diagnosis not present

## 2019-08-23 DIAGNOSIS — Z992 Dependence on renal dialysis: Secondary | ICD-10-CM | POA: Diagnosis not present

## 2019-08-23 DIAGNOSIS — N186 End stage renal disease: Secondary | ICD-10-CM | POA: Diagnosis not present

## 2019-08-26 DIAGNOSIS — Z992 Dependence on renal dialysis: Secondary | ICD-10-CM | POA: Diagnosis not present

## 2019-08-26 DIAGNOSIS — N2581 Secondary hyperparathyroidism of renal origin: Secondary | ICD-10-CM | POA: Diagnosis not present

## 2019-08-26 DIAGNOSIS — N186 End stage renal disease: Secondary | ICD-10-CM | POA: Diagnosis not present

## 2019-08-28 DIAGNOSIS — N2581 Secondary hyperparathyroidism of renal origin: Secondary | ICD-10-CM | POA: Diagnosis not present

## 2019-08-28 DIAGNOSIS — Z992 Dependence on renal dialysis: Secondary | ICD-10-CM | POA: Diagnosis not present

## 2019-08-28 DIAGNOSIS — N186 End stage renal disease: Secondary | ICD-10-CM | POA: Diagnosis not present

## 2019-08-30 DIAGNOSIS — Z992 Dependence on renal dialysis: Secondary | ICD-10-CM | POA: Diagnosis not present

## 2019-08-30 DIAGNOSIS — N186 End stage renal disease: Secondary | ICD-10-CM | POA: Diagnosis not present

## 2019-08-30 DIAGNOSIS — N2581 Secondary hyperparathyroidism of renal origin: Secondary | ICD-10-CM | POA: Diagnosis not present

## 2019-08-31 DIAGNOSIS — Q612 Polycystic kidney, adult type: Secondary | ICD-10-CM | POA: Diagnosis not present

## 2019-08-31 DIAGNOSIS — N186 End stage renal disease: Secondary | ICD-10-CM | POA: Diagnosis not present

## 2019-08-31 DIAGNOSIS — Z992 Dependence on renal dialysis: Secondary | ICD-10-CM | POA: Diagnosis not present

## 2019-09-02 DIAGNOSIS — Z992 Dependence on renal dialysis: Secondary | ICD-10-CM | POA: Diagnosis not present

## 2019-09-02 DIAGNOSIS — N186 End stage renal disease: Secondary | ICD-10-CM | POA: Diagnosis not present

## 2019-09-02 DIAGNOSIS — N2581 Secondary hyperparathyroidism of renal origin: Secondary | ICD-10-CM | POA: Diagnosis not present

## 2019-09-04 DIAGNOSIS — Z992 Dependence on renal dialysis: Secondary | ICD-10-CM | POA: Diagnosis not present

## 2019-09-04 DIAGNOSIS — N186 End stage renal disease: Secondary | ICD-10-CM | POA: Diagnosis not present

## 2019-09-04 DIAGNOSIS — N2581 Secondary hyperparathyroidism of renal origin: Secondary | ICD-10-CM | POA: Diagnosis not present

## 2019-09-06 DIAGNOSIS — Z992 Dependence on renal dialysis: Secondary | ICD-10-CM | POA: Diagnosis not present

## 2019-09-06 DIAGNOSIS — N186 End stage renal disease: Secondary | ICD-10-CM | POA: Diagnosis not present

## 2019-09-06 DIAGNOSIS — N2581 Secondary hyperparathyroidism of renal origin: Secondary | ICD-10-CM | POA: Diagnosis not present

## 2019-09-09 DIAGNOSIS — Z992 Dependence on renal dialysis: Secondary | ICD-10-CM | POA: Diagnosis not present

## 2019-09-09 DIAGNOSIS — N186 End stage renal disease: Secondary | ICD-10-CM | POA: Diagnosis not present

## 2019-09-09 DIAGNOSIS — N2581 Secondary hyperparathyroidism of renal origin: Secondary | ICD-10-CM | POA: Diagnosis not present

## 2019-09-11 DIAGNOSIS — Z992 Dependence on renal dialysis: Secondary | ICD-10-CM | POA: Diagnosis not present

## 2019-09-11 DIAGNOSIS — N2581 Secondary hyperparathyroidism of renal origin: Secondary | ICD-10-CM | POA: Diagnosis not present

## 2019-09-11 DIAGNOSIS — N186 End stage renal disease: Secondary | ICD-10-CM | POA: Diagnosis not present

## 2019-09-13 DIAGNOSIS — N186 End stage renal disease: Secondary | ICD-10-CM | POA: Diagnosis not present

## 2019-09-13 DIAGNOSIS — N2581 Secondary hyperparathyroidism of renal origin: Secondary | ICD-10-CM | POA: Diagnosis not present

## 2019-09-13 DIAGNOSIS — Z992 Dependence on renal dialysis: Secondary | ICD-10-CM | POA: Diagnosis not present

## 2019-09-16 DIAGNOSIS — N2581 Secondary hyperparathyroidism of renal origin: Secondary | ICD-10-CM | POA: Diagnosis not present

## 2019-09-16 DIAGNOSIS — Z992 Dependence on renal dialysis: Secondary | ICD-10-CM | POA: Diagnosis not present

## 2019-09-16 DIAGNOSIS — N186 End stage renal disease: Secondary | ICD-10-CM | POA: Diagnosis not present

## 2019-09-18 DIAGNOSIS — Z992 Dependence on renal dialysis: Secondary | ICD-10-CM | POA: Diagnosis not present

## 2019-09-18 DIAGNOSIS — N2581 Secondary hyperparathyroidism of renal origin: Secondary | ICD-10-CM | POA: Diagnosis not present

## 2019-09-18 DIAGNOSIS — N186 End stage renal disease: Secondary | ICD-10-CM | POA: Diagnosis not present

## 2019-09-20 DIAGNOSIS — N2581 Secondary hyperparathyroidism of renal origin: Secondary | ICD-10-CM | POA: Diagnosis not present

## 2019-09-20 DIAGNOSIS — Z992 Dependence on renal dialysis: Secondary | ICD-10-CM | POA: Diagnosis not present

## 2019-09-20 DIAGNOSIS — N186 End stage renal disease: Secondary | ICD-10-CM | POA: Diagnosis not present

## 2019-09-23 DIAGNOSIS — N186 End stage renal disease: Secondary | ICD-10-CM | POA: Diagnosis not present

## 2019-09-23 DIAGNOSIS — N2581 Secondary hyperparathyroidism of renal origin: Secondary | ICD-10-CM | POA: Diagnosis not present

## 2019-09-23 DIAGNOSIS — Z992 Dependence on renal dialysis: Secondary | ICD-10-CM | POA: Diagnosis not present

## 2019-09-25 DIAGNOSIS — Z992 Dependence on renal dialysis: Secondary | ICD-10-CM | POA: Diagnosis not present

## 2019-09-25 DIAGNOSIS — N186 End stage renal disease: Secondary | ICD-10-CM | POA: Diagnosis not present

## 2019-09-25 DIAGNOSIS — N2581 Secondary hyperparathyroidism of renal origin: Secondary | ICD-10-CM | POA: Diagnosis not present

## 2019-09-27 DIAGNOSIS — N2581 Secondary hyperparathyroidism of renal origin: Secondary | ICD-10-CM | POA: Diagnosis not present

## 2019-09-27 DIAGNOSIS — Z992 Dependence on renal dialysis: Secondary | ICD-10-CM | POA: Diagnosis not present

## 2019-09-27 DIAGNOSIS — N186 End stage renal disease: Secondary | ICD-10-CM | POA: Diagnosis not present

## 2019-09-30 DIAGNOSIS — N186 End stage renal disease: Secondary | ICD-10-CM | POA: Diagnosis not present

## 2019-09-30 DIAGNOSIS — N2581 Secondary hyperparathyroidism of renal origin: Secondary | ICD-10-CM | POA: Diagnosis not present

## 2019-09-30 DIAGNOSIS — Z992 Dependence on renal dialysis: Secondary | ICD-10-CM | POA: Diagnosis not present

## 2019-10-31 DIAGNOSIS — N186 End stage renal disease: Secondary | ICD-10-CM | POA: Diagnosis not present

## 2019-10-31 DIAGNOSIS — Z992 Dependence on renal dialysis: Secondary | ICD-10-CM | POA: Diagnosis not present

## 2019-10-31 DIAGNOSIS — Q612 Polycystic kidney, adult type: Secondary | ICD-10-CM | POA: Diagnosis not present

## 2019-11-01 DIAGNOSIS — N2581 Secondary hyperparathyroidism of renal origin: Secondary | ICD-10-CM | POA: Diagnosis not present

## 2019-11-01 DIAGNOSIS — Z992 Dependence on renal dialysis: Secondary | ICD-10-CM | POA: Diagnosis not present

## 2019-11-01 DIAGNOSIS — N186 End stage renal disease: Secondary | ICD-10-CM | POA: Diagnosis not present

## 2019-11-01 DIAGNOSIS — D689 Coagulation defect, unspecified: Secondary | ICD-10-CM | POA: Diagnosis not present

## 2019-11-04 DIAGNOSIS — N2581 Secondary hyperparathyroidism of renal origin: Secondary | ICD-10-CM | POA: Diagnosis not present

## 2019-11-04 DIAGNOSIS — N186 End stage renal disease: Secondary | ICD-10-CM | POA: Diagnosis not present

## 2019-11-04 DIAGNOSIS — D689 Coagulation defect, unspecified: Secondary | ICD-10-CM | POA: Diagnosis not present

## 2019-11-04 DIAGNOSIS — Z992 Dependence on renal dialysis: Secondary | ICD-10-CM | POA: Diagnosis not present

## 2019-11-06 DIAGNOSIS — Z992 Dependence on renal dialysis: Secondary | ICD-10-CM | POA: Diagnosis not present

## 2019-11-06 DIAGNOSIS — N186 End stage renal disease: Secondary | ICD-10-CM | POA: Diagnosis not present

## 2019-11-06 DIAGNOSIS — N2581 Secondary hyperparathyroidism of renal origin: Secondary | ICD-10-CM | POA: Diagnosis not present

## 2019-11-06 DIAGNOSIS — D689 Coagulation defect, unspecified: Secondary | ICD-10-CM | POA: Diagnosis not present

## 2019-11-08 DIAGNOSIS — Z992 Dependence on renal dialysis: Secondary | ICD-10-CM | POA: Diagnosis not present

## 2019-11-08 DIAGNOSIS — N186 End stage renal disease: Secondary | ICD-10-CM | POA: Diagnosis not present

## 2019-11-08 DIAGNOSIS — D689 Coagulation defect, unspecified: Secondary | ICD-10-CM | POA: Diagnosis not present

## 2019-11-08 DIAGNOSIS — N2581 Secondary hyperparathyroidism of renal origin: Secondary | ICD-10-CM | POA: Diagnosis not present

## 2019-11-11 DIAGNOSIS — Z992 Dependence on renal dialysis: Secondary | ICD-10-CM | POA: Diagnosis not present

## 2019-11-11 DIAGNOSIS — D689 Coagulation defect, unspecified: Secondary | ICD-10-CM | POA: Diagnosis not present

## 2019-11-11 DIAGNOSIS — N186 End stage renal disease: Secondary | ICD-10-CM | POA: Diagnosis not present

## 2019-11-11 DIAGNOSIS — N2581 Secondary hyperparathyroidism of renal origin: Secondary | ICD-10-CM | POA: Diagnosis not present

## 2019-11-12 DIAGNOSIS — N2581 Secondary hyperparathyroidism of renal origin: Secondary | ICD-10-CM | POA: Diagnosis not present

## 2019-11-12 DIAGNOSIS — D689 Coagulation defect, unspecified: Secondary | ICD-10-CM | POA: Diagnosis not present

## 2019-11-12 DIAGNOSIS — Z992 Dependence on renal dialysis: Secondary | ICD-10-CM | POA: Diagnosis not present

## 2019-11-12 DIAGNOSIS — N186 End stage renal disease: Secondary | ICD-10-CM | POA: Diagnosis not present

## 2019-11-13 DIAGNOSIS — D689 Coagulation defect, unspecified: Secondary | ICD-10-CM | POA: Diagnosis not present

## 2019-11-13 DIAGNOSIS — N2581 Secondary hyperparathyroidism of renal origin: Secondary | ICD-10-CM | POA: Diagnosis not present

## 2019-11-13 DIAGNOSIS — N186 End stage renal disease: Secondary | ICD-10-CM | POA: Diagnosis not present

## 2019-11-13 DIAGNOSIS — Z992 Dependence on renal dialysis: Secondary | ICD-10-CM | POA: Diagnosis not present

## 2019-11-15 DIAGNOSIS — Z992 Dependence on renal dialysis: Secondary | ICD-10-CM | POA: Diagnosis not present

## 2019-11-15 DIAGNOSIS — D689 Coagulation defect, unspecified: Secondary | ICD-10-CM | POA: Diagnosis not present

## 2019-11-15 DIAGNOSIS — N2581 Secondary hyperparathyroidism of renal origin: Secondary | ICD-10-CM | POA: Diagnosis not present

## 2019-11-15 DIAGNOSIS — N186 End stage renal disease: Secondary | ICD-10-CM | POA: Diagnosis not present

## 2019-11-18 DIAGNOSIS — Z992 Dependence on renal dialysis: Secondary | ICD-10-CM | POA: Diagnosis not present

## 2019-11-18 DIAGNOSIS — D689 Coagulation defect, unspecified: Secondary | ICD-10-CM | POA: Diagnosis not present

## 2019-11-18 DIAGNOSIS — N186 End stage renal disease: Secondary | ICD-10-CM | POA: Diagnosis not present

## 2019-11-18 DIAGNOSIS — N2581 Secondary hyperparathyroidism of renal origin: Secondary | ICD-10-CM | POA: Diagnosis not present

## 2019-11-20 DIAGNOSIS — Z992 Dependence on renal dialysis: Secondary | ICD-10-CM | POA: Diagnosis not present

## 2019-11-20 DIAGNOSIS — N2581 Secondary hyperparathyroidism of renal origin: Secondary | ICD-10-CM | POA: Diagnosis not present

## 2019-11-20 DIAGNOSIS — N186 End stage renal disease: Secondary | ICD-10-CM | POA: Diagnosis not present

## 2019-11-20 DIAGNOSIS — D689 Coagulation defect, unspecified: Secondary | ICD-10-CM | POA: Diagnosis not present

## 2019-11-22 DIAGNOSIS — Z992 Dependence on renal dialysis: Secondary | ICD-10-CM | POA: Diagnosis not present

## 2019-11-22 DIAGNOSIS — D689 Coagulation defect, unspecified: Secondary | ICD-10-CM | POA: Diagnosis not present

## 2019-11-22 DIAGNOSIS — N186 End stage renal disease: Secondary | ICD-10-CM | POA: Diagnosis not present

## 2019-11-22 DIAGNOSIS — N2581 Secondary hyperparathyroidism of renal origin: Secondary | ICD-10-CM | POA: Diagnosis not present

## 2019-11-25 DIAGNOSIS — N186 End stage renal disease: Secondary | ICD-10-CM | POA: Diagnosis not present

## 2019-11-25 DIAGNOSIS — D689 Coagulation defect, unspecified: Secondary | ICD-10-CM | POA: Diagnosis not present

## 2019-11-25 DIAGNOSIS — N2581 Secondary hyperparathyroidism of renal origin: Secondary | ICD-10-CM | POA: Diagnosis not present

## 2019-11-25 DIAGNOSIS — Z992 Dependence on renal dialysis: Secondary | ICD-10-CM | POA: Diagnosis not present

## 2019-11-27 DIAGNOSIS — Z992 Dependence on renal dialysis: Secondary | ICD-10-CM | POA: Diagnosis not present

## 2019-11-27 DIAGNOSIS — I120 Hypertensive chronic kidney disease with stage 5 chronic kidney disease or end stage renal disease: Secondary | ICD-10-CM | POA: Diagnosis not present

## 2019-11-27 DIAGNOSIS — Z0000 Encounter for general adult medical examination without abnormal findings: Secondary | ICD-10-CM | POA: Diagnosis not present

## 2019-11-27 DIAGNOSIS — N2581 Secondary hyperparathyroidism of renal origin: Secondary | ICD-10-CM | POA: Diagnosis not present

## 2019-11-27 DIAGNOSIS — D485 Neoplasm of uncertain behavior of skin: Secondary | ICD-10-CM | POA: Diagnosis not present

## 2019-11-27 DIAGNOSIS — I440 Atrioventricular block, first degree: Secondary | ICD-10-CM | POA: Diagnosis not present

## 2019-11-27 DIAGNOSIS — R002 Palpitations: Secondary | ICD-10-CM | POA: Diagnosis not present

## 2019-11-27 DIAGNOSIS — M109 Gout, unspecified: Secondary | ICD-10-CM | POA: Diagnosis not present

## 2019-11-27 DIAGNOSIS — Z1389 Encounter for screening for other disorder: Secondary | ICD-10-CM | POA: Diagnosis not present

## 2019-11-27 DIAGNOSIS — E782 Mixed hyperlipidemia: Secondary | ICD-10-CM | POA: Diagnosis not present

## 2019-11-27 DIAGNOSIS — N186 End stage renal disease: Secondary | ICD-10-CM | POA: Diagnosis not present

## 2019-11-27 DIAGNOSIS — R6882 Decreased libido: Secondary | ICD-10-CM | POA: Diagnosis not present

## 2019-11-27 DIAGNOSIS — D689 Coagulation defect, unspecified: Secondary | ICD-10-CM | POA: Diagnosis not present

## 2019-11-27 DIAGNOSIS — I493 Ventricular premature depolarization: Secondary | ICD-10-CM | POA: Diagnosis not present

## 2019-11-29 DIAGNOSIS — N186 End stage renal disease: Secondary | ICD-10-CM | POA: Diagnosis not present

## 2019-11-29 DIAGNOSIS — N2581 Secondary hyperparathyroidism of renal origin: Secondary | ICD-10-CM | POA: Diagnosis not present

## 2019-11-29 DIAGNOSIS — D689 Coagulation defect, unspecified: Secondary | ICD-10-CM | POA: Diagnosis not present

## 2019-11-29 DIAGNOSIS — Z992 Dependence on renal dialysis: Secondary | ICD-10-CM | POA: Diagnosis not present

## 2019-12-01 DIAGNOSIS — Q612 Polycystic kidney, adult type: Secondary | ICD-10-CM | POA: Diagnosis not present

## 2019-12-01 DIAGNOSIS — N186 End stage renal disease: Secondary | ICD-10-CM | POA: Diagnosis not present

## 2019-12-01 DIAGNOSIS — Z992 Dependence on renal dialysis: Secondary | ICD-10-CM | POA: Diagnosis not present

## 2019-12-02 DIAGNOSIS — N186 End stage renal disease: Secondary | ICD-10-CM | POA: Diagnosis not present

## 2019-12-02 DIAGNOSIS — Z992 Dependence on renal dialysis: Secondary | ICD-10-CM | POA: Diagnosis not present

## 2019-12-02 DIAGNOSIS — N2581 Secondary hyperparathyroidism of renal origin: Secondary | ICD-10-CM | POA: Diagnosis not present

## 2019-12-02 NOTE — Progress Notes (Signed)
[facility] Note  12/03/2019     CHIEF COMPLAINT Patient presents for Retina Follow Up   HISTORY OF PRESENT ILLNESS: Michael Laster. is a 64 y.o. male who presents to the clinic today for:   HPI    Retina Follow Up    Patient presents with  Other.  In left eye.  This started 6 months ago.  Severity is moderate.  I, the attending physician,  performed the HPI with the patient and updated documentation appropriately.          Comments    Patient here for 6 months retina follow up for HRVO OS. Patient states vision doing pretty good. No eye pain.        Last edited by [name], MD on 12/03/2019 12:14 PM. (History)    pt states he had a heart attack in February due to receiving too much anesthesia in the OR while replacing his fistula, he states he was "dead for 4 minutes" and in a coma for 3 days, the fistula was never replaced, but he is still receiving dialysis through a port in his chest, he is still using his drops, but is not taking the acyclovir that Dr. Kathlen Mody prescribed, he states his BP has been good  Referring physician: Hortencia Pilar, MD [address] [place],  [place] [address]  HISTORICAL INFORMATION:   Selected notes from the [facility] Referred by Dr. [name]. Hecker for concern of HRVO OD; LEE- 11.30.18 ([name]) {BCVA OD: 20/100-1 OS: 20/20-2] Ocular Hx- cataract OU PMH- HTN, high chol, kidney disease, sleep apnea, emphysema    CURRENT MEDICATIONS: Current Outpatient Medications (Ophthalmic Drugs)  Medication Sig  . dorzolamide-timolol (COSOPT) 22.3-6.8 MG/ML ophthalmic solution Place 1 drop into both eyes 2 (two) times daily.  . prednisoLONE acetate (PRED FORTE) 1 % ophthalmic suspension Place 1 drop into the right eye daily at 12 noon.    No current facility-administered medications for this visit. (Ophthalmic Drugs)   Current Outpatient Medications (Other)  Medication Sig  . acetaminophen  (TYLENOL) 325 MG tablet Take 1-2 tablets (325-650 mg total) by mouth every 4 (four) hours as needed for mild pain.  [name] acyclovir (ZOVIRAX) 400 MG tablet Take 1 tablet (400 mg total) by mouth daily with lunch.  [name] aspirin EC 81 MG EC tablet Take 1 tablet (81 mg total) by mouth daily.  [name] atorvastatin (LIPITOR) 40 MG tablet Take 40 mg by mouth at bedtime.   . metoprolol succinate (TOPROL-XL) 25 MG 24 hr tablet Take 0.5 tablets (12.5 mg total) by mouth daily.  . multivitamin (RENA-VIT) TABS tablet Take 1 tablet by mouth at bedtime.  . nitroGLYCERIN (NITROSTAT) 0.4 MG SL tablet Place 1 tablet (0.4 mg total) under the tongue every 5 (five) minutes x 3 doses as needed for chest pain.  . pantoprazole (PROTONIX) 40 MG tablet Take 1 tablet (40 mg total) by mouth daily.  [name] senna (SENOKOT) 8.6 MG TABS tablet Take 1 tablet (8.6 mg total) by mouth 2 (two) times daily as needed for mild constipation.  . sevelamer carbonate (RENVELA) 800 MG tablet Take 2 tablets (1,600 mg total) by mouth 3 (three) times daily with meals.   No current facility-administered medications for this visit. (Other)   REVIEW OF SYSTEMS: ROS    Positive for: Genitourinary, Cardiovascular, Eyes, Respiratory   Negative for: Constitutional, Gastrointestinal, Neurological, Skin, Musculoskeletal, HENT, Endocrine, Psychiatric, Allergic/Imm, Heme/Lymph   Last edited by Patsey Berthold  S, COA on 12/03/2019  8:56 AM. (History)     ALLERGIES No Known Allergies  PAST MEDICAL HISTORY Past Medical History:  Diagnosis Date  . Atrial arrhythmia    atrial tachycardia with variable AV conduction versus atypical aflutter 01/10/19, rate control (02/06/19)  . Cataract   . Dyspnea    on exertion  . ESRD (end stage renal disease) ([place])    H[place]- MWF, Polycystic kidney disease  . Fatigue   . History of kidney stones    removal of stone- cysto  . Hyperlipidemia   . Hyperparathyroidism, secondary renal ([place])   . Hypertension   . Hypoxemia 12/12/2013   . Nonischemic cardiomyopathy ([place])    Er 25% 2015, 55 % 2013  . OSA on CPAP    no longer using cpap  . OSA on CPAP 03/24/2014  . Pneumonia    2015ish  . Ventricular tachycardia//Freq PVCs   . Wears glasses    Past Surgical History:  Procedure Laterality Date  . A-FLUTTER ABLATION N/A 04/11/2019   Procedure: A-FLUTTER ABLATION;  Surgeon: Evans Lance, MD;  Location: [facility];  Service: Cardiovascular;  Laterality: N/A;  . A/V FISTULAGRAM Left 04/27/2017   Procedure: A/V FISTULAGRAM;  Surgeon: Conrad Rome, MD;  Location: [facility];  Service: Cardiovascular;  Laterality: Left;  lt arm  . A/V FISTULAGRAM Left 01/10/2019   Procedure: A/V FISTULAGRAM;  Surgeon: Marty Heck, MD;  Location: [facility];  Service: Cardiovascular;  Laterality: Left;  . APPENDECTOMY    . AV FISTULA PLACEMENT  12/05/2011   Procedure: ARTERIOVENOUS (AV) FISTULA CREATION;LLEFT ARM  Surgeon: Conrad Gentry, MD;  Location: [facility];  Service: Vascular;  Laterality: Left;  RADIO-CEPHALIC  fistula left arm  . AV FISTULA PLACEMENT  01/11/2012   Procedure: ARTERIOVENOUS (AV) FISTULA CREATION;  Surgeon: Conrad Ebro, MD;  Location: [facility];  Service: Vascular;  Laterality: Left;  Creation of left brachial cephalic arteriovenous fistula  . AV FISTULA PLACEMENT Right 03/07/2019   Procedure: ARTERIOVENOUS (AV) FISTULA CREATION  RIGHT ARM;  Surgeon: Marty Heck, MD;  Location: [facility];  Service: Vascular;  Laterality: Right;  . BASCILIC VEIN TRANSPOSITION Left 12/27/2016   Procedure: BASILIC VEIN TRANSPOSITION LEFT UPPER ARM FIRST STAGE;  Surgeon: Conrad Stewart, MD;  Location: [facility];  Service: Vascular;  Laterality: Left;  . BASCILIC VEIN TRANSPOSITION Left 01/31/2017   Procedure: LEFT ARM BASILIC VEIN TRANSPOSITION, SECOND STAGE;  Surgeon: Conrad Weissport, MD;  Location: [facility];  Service: Vascular;  Laterality: Left;  . CARDIAC CATHETERIZATION  04-05-2010   checking for blockage but none-[facility]   . COLONOSCOPY    . CYSTOSCOPY W/ STONE MANIPULATION     "laser once" (01/22/2013)  . HEMATOMA EVACUATION Left 05/09/2017   Procedure: EVACUATION HEMATOMA LEFT ARM;  Surgeon: Conrad Acworth, MD;  Location: [facility];  Service: Vascular;  Laterality: Left;  . HERNIA REPAIR    . INGUINAL HERNIA REPAIR Right 11/06/2015   Procedure: OPEN HERNIA REPAIR  RIGHT INGUINAL ADULT;  Surgeon: [name], MD;  Location: [facility];  Service: General;  Laterality: Right;  with MESH  . INSERTION OF DIALYSIS CATHETER Right 10/05/2016   Procedure: INSERTION OF right internal jugular DIALYSIS CATHETER;  Surgeon: [name], MD;  Location: [facility];  Service: Vascular;  Laterality: Right;  . INSERTION OF MESH  03/20/2012   Procedure: INSERTION OF MESH;  UMB Surgeon: [name], MD;  Location: [facility];  Service: General;  Laterality: N/A;  . INSERTION OF MESH N/A 01/22/2013   Procedure: INSERTION OF MESH;  Surgeon: [name], MD;  Location: [facility];  Service: General;  Laterality: N/A;  . LAPAROTOMY  04/02/2012   Procedure: EXPLORATORY LAPAROTOMY;  Surgeon: [name], MD;  Location: [facility];  Service: General;  Laterality: N/A;  Exploratory Laparotomy with resection of small intestine  . LEFT HEART CATHETERIZATION WITH CORONARY ANGIOGRAM N/A 05/14/2013   Procedure: LEFT HEART CATHETERIZATION WITH CORONARY ANGIOGRAM;  Surgeon: Sinclair Grooms, MD;  Location[facility];  Service: Cardiovascular;  Laterality: N/A;  . LIGATION OF ARTERIOVENOUS  FISTULA Left 12/27/2016   Procedure: LIGATION/EXCISION OF LEFT UPPER ARM ARTERIOVENOUS  FISTULA;  EVACUATION OF HEMATOMA;  Surgeon: Conrad Almira, MD;  Location: [facility];  Service: Vascular;  Laterality: Left;  . LIGATION OF ARTERIOVENOUS  FISTULA Left 03/07/2019   Procedure: LIGATION OF ARTERIOVENOUS FISTULA  LEFT UPPER ARM;  Surgeon: Marty Heck, MD;  Location: [facility];  Service: Vascular;  Laterality: Left;  . REVISON OF ARTERIOVENOUS FISTULA Left 10/05/2016    Procedure: REVISON OF left arm ARTERIOVENOUS FISTULA;  Surgeon: [name], MD;  Location: [facility];  Service: Vascular;  Laterality: Left;  . TONSILLECTOMY    . UMBILICAL HERNIA REPAIR  03/20/2012   Procedure: HERNIA REPAIR UMBILICAL ADULT;  Surgeon: [name], MD;  Location: [facility];  Service: General;  Laterality: N/A;  . UMBILICAL HERNIA REPAIR  01/22/2013   preperitoneal open procedure due to significant adhesions/notes 01/22/2013  . VENTRAL HERNIA REPAIR N/A 01/22/2013   Procedure: ATTEMPTED LAPAROSCOPIC VENTRAL HERNIA CONVERTED TO OPEN;  Surgeon: [name], MD;  [facility];  Service: General;  Laterality: N/A;   FAMILY HISTORY Family History  Problem Relation Age of Onset  . Heart disease Mother   . Hyperlipidemia Mother   . Hypertension Mother   . Kidney disease Father   . Stroke Father   . Kidney disease Brother   . Amblyopia Neg Hx   . Blindness Neg Hx   . Cataracts Neg Hx   . Diabetes Neg Hx   . Glaucoma Neg Hx   . Macular degeneration Neg Hx   . Retinal detachment Neg Hx   . Strabismus Neg Hx   . Retinitis pigmentosa Neg Hx    SOCIAL HISTORY Social History   Tobacco Use  . Smoking status: Never Smoker  . Smokeless tobacco: Never Used  Vaping Use  . Vaping Use: Never used  Substance Use Topics  . Alcohol use: No    Alcohol/week: 0.0 standard drinks  . Drug use: No         OPHTHALMIC EXAM:  Base Eye Exam    Visual Acuity (Snellen - Linear)      Right Left   Dist cc 20/25 -2 20/20 -1   Dist ph cc 20/20 -1    Correction: Glasses       Tonometry (Tonopen, 8:51 AM)      Right Left   Pressure 19 19       Pupils      Dark Light Shape React APD   Right 4 3 Round Brisk None   Left 4 3 Round Brisk None       Visual Fields (Counting fingers)      Left Right    Full Full       Extraocular Movement      Right Left    Full, Ortho Full, Ortho  Neuro/Psych    Oriented x3: Yes   Mood/Affect: Normal       Dilation    Both  eyes: 1.0% Mydriacyl, 2.5% Phenylephrine @ 8:51 AM        Slit Lamp and Fundus Exam    Slit Lamp Exam      Right Left   Lids/Lashes Dermatochalasis - upper lid Dermatochalasis - upper lid   Conjunctiva/Sclera Mild Melanosis, Nasal Pinguecula, trace Injection Mild Melanosis, nasal and temporal Pinguecula   Cornea Arcus, 1+ Punctate epithelial erosions, patchy corneal haze Arcus, 1+ Punctate epithelial erosions   Anterior Chamber Deep and quiet Deep and quiet   Iris Round and dilated Round and dilated   Lens 2+ Nuclear sclerosis, 2+ Cortical cataract 2+ Nuclear sclerosis, 2+ Cortical cataract   Vitreous Vitreous syneresis, +Posterior vitreous detachment, trace cell Vitreous syneresis, Posterior vitreous detachment, +cell       Fundus Exam      Right Left   Disc Pink and Sharp, fine vascular loops superiorly trace hyperemia, Pink and Sharp   C/D Ratio 0.5 0.6   Macula Flat, good foveal reflex, mild RPE mottling and clumping, No heme or edema Blunted foveal reflex, focal blot hemes nasal macula extending from disc to fovea - improved, central RPE clumping, interval improvement in cystic changes, telengectetic vessels IN mac   Vessels Vascular attenuation, Tortuous Vascular attenuation, Tortuous, mild Copper wiring, AV crossing changes, +inferior HRVO   Periphery Attached, pigmented Chorioretinal scar at 1030, No heme  Attached, pigmented lattice with 3 atrophic holes at 1030 ora - good laser surrounding, cluster of blot heme inferiorly -- persistent, pigmented macroaneurysm at 0300 - improving        Refraction    Wearing Rx      Sphere Cylinder Axis Add   Right +0.50 +0.50 168 +2.75   Left +0.50 +0.50 102 +2.75   Type: Bifocal       Wearing Rx #2      Sphere Cylinder Axis Add   Right +0.50   +2.25   Left +0.25   +2.25   Type: Bifocal         IMAGING AND PROCEDURES  Imaging and Procedures for 08/31/17  OCT, Retina - OU - Both Eyes       Right Eye Quality was good.  Central Foveal Thickness: 251. Progression has been stable. Findings include normal foveal contour, no SRF, no IRF, vitreomacular adhesion , inner retinal atrophy (Inner retinal atrophy IT quad caught on widefield - stable).   Left Eye Quality was good. Central Foveal Thickness: 271. Progression has improved. Findings include normal foveal contour, no SRF, epiretinal membrane, vitreomacular adhesion , outer retinal atrophy, intraretinal hyper-reflective material, no IRF (Interval improvement in cystic changes IN macula, stable improvement in ellipsoid signal and IRHM).   Notes *Images captured and stored on drive  Diagnosis / Impression:  OD: NFP, No IRF/SRF; Inner retinal atrophy IT quad caught on widefield - stable OS: trace ERM; Interval improvement in cystic changes IN macula, stable improvement in ellipsoid signal and IRHM  Clinical management:  See below  Abbreviations: NFP - Normal foveal profile. CME - cystoid macular edema. PED - pigment epithelial detachment. IRF - intraretinal fluid. SRF - subretinal fluid. EZ - ellipsoid zone. ERM - epiretinal membrane. ORA - outer retinal atrophy. ORT - outer retinal tubulation. SRHM - subretinal hyper-reflective material                  ASSESSMENT/PLAN:    ICD-10-CM  1. Branch retinal vein occlusion of left eye with macular edema  H34.8320   2. Retinal edema  H35.81 OCT, Retina - OU - Both Eyes  3. Herpes keratitis  B00.52   4. Anterior uveitis  H20.9   5. Branch retinal vein occlusion of right eye with macular edema  H34.8310   6. Lattice degeneration of left retina  H35.412   7. Atrophic retinal break, multiple, left eye  H33.332   8. Posterior vitreous detachment of both eyes  H43.813   9. Combined forms of age-related cataract of both eyes  H25.813    1,2. Inferior HRVO OS  - pt lost to f/u since Jan 2021 -- in Feb had a heart attack while undergoing fistula surgery  - just prior to 9.24.20 visit, found to have a clot in  fistula, left arm  - s/p IVA OS #1 (09.24.20), #2 (10.22.20), #3 (11.23.20), #4 (1.14.21)  - BCVA stable at 20/20 OS  - OCT shows interval improvement in cystic changes IN macula, stable improvement in ellipsoid signal and IRHM  - exam shows improved DBH  - no intervention recommended today  - F/U 4 months -- DFE/OCT/possible injection  3,4. 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  - likely etiology of prior episodes of Anterior Uveitis OD  - FA on 3.18.19 without posterior involvement, vasculitis or leakage OD  - had recurrent AC cell and stellate KP on 3.11.2020 - restarted PF QID OD  - today KP and injection stably resolved, only rare pigment remains  - cornea OD with new scarring / haze; no dendrites  - IOP 19 OU and BCVA 20/20 OD  - cont Pred Forte to QD OD  - given history of repeat recurrences of anterior uveitis despite maintenance acyclovir, may need to be on a maintenance dose of topical steroid  - cont Cosopt BID OU  - epithelial dendrite remains resolved today (on po acyclovir; off topical trifluridine)  - today interval development of corneal haze -- will restart 472m po acyclovir and send back to Dr. WKathlen Modyfor further eval and management  5. History of BRVO w/ CME OD  - s/p IVA OD #1 (12.21.18), #2 (01.18.19), #3 (02.14.19), #4 (04.02.19), #5 (05.02.19)  - OCT today with stable improvement in IRF despite no antiVEGF therapy since May 2019 -- resolved  - will continue to hold on injection OD  6,7. Lattice degeneration with atrophic holes OS-   - peripheral holes superiorly at 1030 without SRF or RD  - S/P laser retinopexy OS (01.04.19)  - good laser in place--stable  8. PVD / vitreous syneresis OU  - Discussed findings and prognosis  - No RT or RD on 360 scleral depressed exam  - Reviewed s/s of RT/RD  - Strict return precautions for any such RT/RD signs/symptoms  9. Combined forms age-related cataract OU-   - The symptoms of cataract, surgical  options, and treatments and risks were discussed with patient.  - discussed diagnosis and progression  - not yet visually significant  - monitor for now  Ophthalmic Meds Ordered this visit:  Meds ordered this encounter  Medications  . acyclovir (ZOVIRAX) 400 MG tablet    Sig: Take 1 tablet (400 mg total) by mouth daily with lunch.    Dispense:  30 tablet    Refill:  1      Return in about 4 months (around 04/03/2020) for f/u HRVO OS, DFE, OCT.  There are no Patient Instructions on file for this  visit.  This document serves as a record of services personally performed by Gardiner Sleeper, MD, PhD. It was created on their behalf by Estill Bakes, COT an ophthalmic technician. The creation of this record is the provider's dictation and/or activities during the visit.    Electronically signed by: Estill Bakes, COT 8.2.21 @ 12:20 PM  Gardiner Sleeper, M.D., Ph.D. Diseases & Surgery of the Retina and [place] 12/03/2019   I have reviewed the above documentation for accuracy and completeness, and I agree with the above. Gardiner Sleeper, M.D., Ph.D. 12/03/19 12:20 PM    Abbreviations: M myopia (nearsighted); A astigmatism; H hyperopia (farsighted); P presbyopia; Mrx spectacle prescription;  CTL contact lenses; OD right eye; OS left eye; OU both eyes  XT exotropia; ET esotropia; PEK punctate epithelial keratitis; PEE punctate epithelial erosions; DES dry eye syndrome; MGD meibomian gland dysfunction; ATs artificial tears; PFAT's preservative free artificial tears; Naplate nuclear sclerotic cataract; PSC posterior subcapsular cataract; ERM epi-retinal membrane; PVD posterior vitreous detachment; RD retinal detachment; DM diabetes mellitus; DR diabetic retinopathy; NPDR non-proliferative diabetic retinopathy; PDR proliferative diabetic retinopathy; CSME clinically significant macular edema; DME diabetic macular edema; dbh dot blot hemorrhages; CWS cotton wool spot;  POAG primary open angle glaucoma; C/D cup-to-disc ratio; HVF humphrey visual field; GVF goldmann visual field; OCT optical coherence tomography; IOP intraocular pressure; BRVO Branch retinal vein occlusion; CRVO central retinal vein occlusion; CRAO central retinal artery occlusion; BRAO branch retinal artery occlusion; RT retinal tear; SB scleral buckle; PPV pars plana vitrectomy; VH Vitreous hemorrhage; PRP panretinal laser photocoagulation; IVK intravitreal kenalog; VMT vitreomacular traction; MH Macular hole;  NVD neovascularization of the disc; NVE neovascularization elsewhere; AREDS age related eye disease study; ARMD age related macular degeneration; POAG primary open angle glaucoma; EBMD epithelial/anterior basement membrane dystrophy; ACIOL anterior chamber intraocular lens; IOL intraocular lens; PCIOL posterior chamber intraocular lens; Phaco/IOL phacoemulsification with intraocular lens placement; [place] photorefractive keratectomy; LASIK laser assisted in situ keratomileusis; HTN hypertension; DM diabetes mellitus; COPD chronic obstructive pulmonary disease

## 2019-12-03 ENCOUNTER — Ambulatory Visit (INDEPENDENT_AMBULATORY_CARE_PROVIDER_SITE_OTHER): Payer: Medicare Other | Admitting: Ophthalmology

## 2019-12-03 ENCOUNTER — Encounter (INDEPENDENT_AMBULATORY_CARE_PROVIDER_SITE_OTHER): Payer: Self-pay | Admitting: Ophthalmology

## 2019-12-03 ENCOUNTER — Other Ambulatory Visit: Payer: Self-pay

## 2019-12-03 DIAGNOSIS — H3581 Retinal edema: Secondary | ICD-10-CM

## 2019-12-03 DIAGNOSIS — H209 Unspecified iridocyclitis: Secondary | ICD-10-CM | POA: Diagnosis not present

## 2019-12-03 DIAGNOSIS — H35412 Lattice degeneration of retina, left eye: Secondary | ICD-10-CM | POA: Diagnosis not present

## 2019-12-03 DIAGNOSIS — H34831 Tributary (branch) retinal vein occlusion, right eye, with macular edema: Secondary | ICD-10-CM | POA: Diagnosis not present

## 2019-12-03 DIAGNOSIS — H25813 Combined forms of age-related cataract, bilateral: Secondary | ICD-10-CM

## 2019-12-03 DIAGNOSIS — H34832 Tributary (branch) retinal vein occlusion, left eye, with macular edema: Secondary | ICD-10-CM | POA: Diagnosis not present

## 2019-12-03 DIAGNOSIS — H33332 Multiple defects of retina without detachment, left eye: Secondary | ICD-10-CM

## 2019-12-03 DIAGNOSIS — B0052 Herpesviral keratitis: Secondary | ICD-10-CM

## 2019-12-03 DIAGNOSIS — H43813 Vitreous degeneration, bilateral: Secondary | ICD-10-CM | POA: Diagnosis not present

## 2019-12-03 MED ORDER — ACYCLOVIR 400 MG PO TABS: 400.0000 mg | ORAL_TABLET | Freq: Every day | ORAL | 1 refills | Status: DC

## 2019-12-04 DIAGNOSIS — N2581 Secondary hyperparathyroidism of renal origin: Secondary | ICD-10-CM | POA: Diagnosis not present

## 2019-12-04 DIAGNOSIS — Z992 Dependence on renal dialysis: Secondary | ICD-10-CM | POA: Diagnosis not present

## 2019-12-04 DIAGNOSIS — N186 End stage renal disease: Secondary | ICD-10-CM | POA: Diagnosis not present

## 2019-12-06 DIAGNOSIS — Z992 Dependence on renal dialysis: Secondary | ICD-10-CM | POA: Diagnosis not present

## 2019-12-06 DIAGNOSIS — N2581 Secondary hyperparathyroidism of renal origin: Secondary | ICD-10-CM | POA: Diagnosis not present

## 2019-12-06 DIAGNOSIS — N186 End stage renal disease: Secondary | ICD-10-CM | POA: Diagnosis not present

## 2019-12-09 DIAGNOSIS — N186 End stage renal disease: Secondary | ICD-10-CM | POA: Diagnosis not present

## 2019-12-09 DIAGNOSIS — N2581 Secondary hyperparathyroidism of renal origin: Secondary | ICD-10-CM | POA: Diagnosis not present

## 2019-12-09 DIAGNOSIS — Z992 Dependence on renal dialysis: Secondary | ICD-10-CM | POA: Diagnosis not present

## 2019-12-11 DIAGNOSIS — Z992 Dependence on renal dialysis: Secondary | ICD-10-CM | POA: Diagnosis not present

## 2019-12-11 DIAGNOSIS — N2581 Secondary hyperparathyroidism of renal origin: Secondary | ICD-10-CM | POA: Diagnosis not present

## 2019-12-11 DIAGNOSIS — N186 End stage renal disease: Secondary | ICD-10-CM | POA: Diagnosis not present

## 2019-12-12 DIAGNOSIS — R6882 Decreased libido: Secondary | ICD-10-CM | POA: Diagnosis not present

## 2019-12-12 DIAGNOSIS — E782 Mixed hyperlipidemia: Secondary | ICD-10-CM | POA: Diagnosis not present

## 2019-12-12 DIAGNOSIS — Z125 Encounter for screening for malignant neoplasm of prostate: Secondary | ICD-10-CM | POA: Diagnosis not present

## 2019-12-12 DIAGNOSIS — R8281 Pyuria: Secondary | ICD-10-CM | POA: Diagnosis not present

## 2019-12-12 DIAGNOSIS — I120 Hypertensive chronic kidney disease with stage 5 chronic kidney disease or end stage renal disease: Secondary | ICD-10-CM | POA: Diagnosis not present

## 2019-12-13 DIAGNOSIS — N2581 Secondary hyperparathyroidism of renal origin: Secondary | ICD-10-CM | POA: Diagnosis not present

## 2019-12-13 DIAGNOSIS — Z992 Dependence on renal dialysis: Secondary | ICD-10-CM | POA: Diagnosis not present

## 2019-12-13 DIAGNOSIS — N186 End stage renal disease: Secondary | ICD-10-CM | POA: Diagnosis not present

## 2019-12-16 DIAGNOSIS — N186 End stage renal disease: Secondary | ICD-10-CM | POA: Diagnosis not present

## 2019-12-16 DIAGNOSIS — N2581 Secondary hyperparathyroidism of renal origin: Secondary | ICD-10-CM | POA: Diagnosis not present

## 2019-12-16 DIAGNOSIS — Z992 Dependence on renal dialysis: Secondary | ICD-10-CM | POA: Diagnosis not present

## 2019-12-18 DIAGNOSIS — Z992 Dependence on renal dialysis: Secondary | ICD-10-CM | POA: Diagnosis not present

## 2019-12-18 DIAGNOSIS — N2581 Secondary hyperparathyroidism of renal origin: Secondary | ICD-10-CM | POA: Diagnosis not present

## 2019-12-18 DIAGNOSIS — N186 End stage renal disease: Secondary | ICD-10-CM | POA: Diagnosis not present

## 2019-12-20 DIAGNOSIS — N2581 Secondary hyperparathyroidism of renal origin: Secondary | ICD-10-CM | POA: Diagnosis not present

## 2019-12-20 DIAGNOSIS — N186 End stage renal disease: Secondary | ICD-10-CM | POA: Diagnosis not present

## 2019-12-20 DIAGNOSIS — Z992 Dependence on renal dialysis: Secondary | ICD-10-CM | POA: Diagnosis not present

## 2019-12-23 DIAGNOSIS — Z992 Dependence on renal dialysis: Secondary | ICD-10-CM | POA: Diagnosis not present

## 2019-12-23 DIAGNOSIS — N2581 Secondary hyperparathyroidism of renal origin: Secondary | ICD-10-CM | POA: Diagnosis not present

## 2019-12-23 DIAGNOSIS — N186 End stage renal disease: Secondary | ICD-10-CM | POA: Diagnosis not present

## 2019-12-25 DIAGNOSIS — N2581 Secondary hyperparathyroidism of renal origin: Secondary | ICD-10-CM | POA: Diagnosis not present

## 2019-12-25 DIAGNOSIS — N186 End stage renal disease: Secondary | ICD-10-CM | POA: Diagnosis not present

## 2019-12-25 DIAGNOSIS — Z992 Dependence on renal dialysis: Secondary | ICD-10-CM | POA: Diagnosis not present

## 2019-12-27 DIAGNOSIS — N2581 Secondary hyperparathyroidism of renal origin: Secondary | ICD-10-CM | POA: Diagnosis not present

## 2019-12-27 DIAGNOSIS — Z992 Dependence on renal dialysis: Secondary | ICD-10-CM | POA: Diagnosis not present

## 2019-12-27 DIAGNOSIS — N186 End stage renal disease: Secondary | ICD-10-CM | POA: Diagnosis not present

## 2019-12-30 DIAGNOSIS — Z992 Dependence on renal dialysis: Secondary | ICD-10-CM | POA: Diagnosis not present

## 2019-12-30 DIAGNOSIS — N2581 Secondary hyperparathyroidism of renal origin: Secondary | ICD-10-CM | POA: Diagnosis not present

## 2019-12-30 DIAGNOSIS — N186 End stage renal disease: Secondary | ICD-10-CM | POA: Diagnosis not present

## 2020-01-01 DIAGNOSIS — Q612 Polycystic kidney, adult type: Secondary | ICD-10-CM | POA: Diagnosis not present

## 2020-01-01 DIAGNOSIS — N186 End stage renal disease: Secondary | ICD-10-CM | POA: Diagnosis not present

## 2020-01-01 DIAGNOSIS — N2581 Secondary hyperparathyroidism of renal origin: Secondary | ICD-10-CM | POA: Diagnosis not present

## 2020-01-01 DIAGNOSIS — Z992 Dependence on renal dialysis: Secondary | ICD-10-CM | POA: Diagnosis not present

## 2020-01-03 DIAGNOSIS — N2581 Secondary hyperparathyroidism of renal origin: Secondary | ICD-10-CM | POA: Diagnosis not present

## 2020-01-03 DIAGNOSIS — Z992 Dependence on renal dialysis: Secondary | ICD-10-CM | POA: Diagnosis not present

## 2020-01-03 DIAGNOSIS — N186 End stage renal disease: Secondary | ICD-10-CM | POA: Diagnosis not present

## 2020-01-06 DIAGNOSIS — Z992 Dependence on renal dialysis: Secondary | ICD-10-CM | POA: Diagnosis not present

## 2020-01-06 DIAGNOSIS — N2581 Secondary hyperparathyroidism of renal origin: Secondary | ICD-10-CM | POA: Diagnosis not present

## 2020-01-06 DIAGNOSIS — N186 End stage renal disease: Secondary | ICD-10-CM | POA: Diagnosis not present

## 2020-01-08 DIAGNOSIS — N186 End stage renal disease: Secondary | ICD-10-CM | POA: Diagnosis not present

## 2020-01-08 DIAGNOSIS — N2581 Secondary hyperparathyroidism of renal origin: Secondary | ICD-10-CM | POA: Diagnosis not present

## 2020-01-08 DIAGNOSIS — Z992 Dependence on renal dialysis: Secondary | ICD-10-CM | POA: Diagnosis not present

## 2020-01-10 DIAGNOSIS — N186 End stage renal disease: Secondary | ICD-10-CM | POA: Diagnosis not present

## 2020-01-10 DIAGNOSIS — N2581 Secondary hyperparathyroidism of renal origin: Secondary | ICD-10-CM | POA: Diagnosis not present

## 2020-01-10 DIAGNOSIS — Z992 Dependence on renal dialysis: Secondary | ICD-10-CM | POA: Diagnosis not present

## 2020-01-13 DIAGNOSIS — Z992 Dependence on renal dialysis: Secondary | ICD-10-CM | POA: Diagnosis not present

## 2020-01-13 DIAGNOSIS — N2581 Secondary hyperparathyroidism of renal origin: Secondary | ICD-10-CM | POA: Diagnosis not present

## 2020-01-13 DIAGNOSIS — N186 End stage renal disease: Secondary | ICD-10-CM | POA: Diagnosis not present

## 2020-01-15 DIAGNOSIS — Z992 Dependence on renal dialysis: Secondary | ICD-10-CM | POA: Diagnosis not present

## 2020-01-15 DIAGNOSIS — N2581 Secondary hyperparathyroidism of renal origin: Secondary | ICD-10-CM | POA: Diagnosis not present

## 2020-01-15 DIAGNOSIS — N186 End stage renal disease: Secondary | ICD-10-CM | POA: Diagnosis not present

## 2020-01-16 DIAGNOSIS — Z23 Encounter for immunization: Secondary | ICD-10-CM | POA: Diagnosis not present

## 2020-01-17 DIAGNOSIS — N2581 Secondary hyperparathyroidism of renal origin: Secondary | ICD-10-CM | POA: Diagnosis not present

## 2020-01-17 DIAGNOSIS — Z992 Dependence on renal dialysis: Secondary | ICD-10-CM | POA: Diagnosis not present

## 2020-01-17 DIAGNOSIS — N186 End stage renal disease: Secondary | ICD-10-CM | POA: Diagnosis not present

## 2020-01-20 DIAGNOSIS — N186 End stage renal disease: Secondary | ICD-10-CM | POA: Diagnosis not present

## 2020-01-20 DIAGNOSIS — Z992 Dependence on renal dialysis: Secondary | ICD-10-CM | POA: Diagnosis not present

## 2020-01-20 DIAGNOSIS — N2581 Secondary hyperparathyroidism of renal origin: Secondary | ICD-10-CM | POA: Diagnosis not present

## 2020-01-22 DIAGNOSIS — N186 End stage renal disease: Secondary | ICD-10-CM | POA: Diagnosis not present

## 2020-01-22 DIAGNOSIS — N2581 Secondary hyperparathyroidism of renal origin: Secondary | ICD-10-CM | POA: Diagnosis not present

## 2020-01-22 DIAGNOSIS — Z992 Dependence on renal dialysis: Secondary | ICD-10-CM | POA: Diagnosis not present

## 2020-01-24 DIAGNOSIS — Z992 Dependence on renal dialysis: Secondary | ICD-10-CM | POA: Diagnosis not present

## 2020-01-24 DIAGNOSIS — N2581 Secondary hyperparathyroidism of renal origin: Secondary | ICD-10-CM | POA: Diagnosis not present

## 2020-01-24 DIAGNOSIS — N186 End stage renal disease: Secondary | ICD-10-CM | POA: Diagnosis not present

## 2020-01-27 DIAGNOSIS — Z992 Dependence on renal dialysis: Secondary | ICD-10-CM | POA: Diagnosis not present

## 2020-01-27 DIAGNOSIS — N2581 Secondary hyperparathyroidism of renal origin: Secondary | ICD-10-CM | POA: Diagnosis not present

## 2020-01-27 DIAGNOSIS — N186 End stage renal disease: Secondary | ICD-10-CM | POA: Diagnosis not present

## 2020-01-29 DIAGNOSIS — N186 End stage renal disease: Secondary | ICD-10-CM | POA: Diagnosis not present

## 2020-01-29 DIAGNOSIS — Z992 Dependence on renal dialysis: Secondary | ICD-10-CM | POA: Diagnosis not present

## 2020-01-29 DIAGNOSIS — N2581 Secondary hyperparathyroidism of renal origin: Secondary | ICD-10-CM | POA: Diagnosis not present

## 2020-01-31 DIAGNOSIS — N186 End stage renal disease: Secondary | ICD-10-CM | POA: Diagnosis not present

## 2020-01-31 DIAGNOSIS — D689 Coagulation defect, unspecified: Secondary | ICD-10-CM | POA: Diagnosis not present

## 2020-01-31 DIAGNOSIS — Q612 Polycystic kidney, adult type: Secondary | ICD-10-CM | POA: Diagnosis not present

## 2020-01-31 DIAGNOSIS — Z992 Dependence on renal dialysis: Secondary | ICD-10-CM | POA: Diagnosis not present

## 2020-01-31 DIAGNOSIS — N2581 Secondary hyperparathyroidism of renal origin: Secondary | ICD-10-CM | POA: Diagnosis not present

## 2020-02-03 DIAGNOSIS — D689 Coagulation defect, unspecified: Secondary | ICD-10-CM | POA: Diagnosis not present

## 2020-02-03 DIAGNOSIS — Z992 Dependence on renal dialysis: Secondary | ICD-10-CM | POA: Diagnosis not present

## 2020-02-03 DIAGNOSIS — N186 End stage renal disease: Secondary | ICD-10-CM | POA: Diagnosis not present

## 2020-02-03 DIAGNOSIS — N2581 Secondary hyperparathyroidism of renal origin: Secondary | ICD-10-CM | POA: Diagnosis not present

## 2020-02-05 DIAGNOSIS — Z992 Dependence on renal dialysis: Secondary | ICD-10-CM | POA: Diagnosis not present

## 2020-02-05 DIAGNOSIS — D689 Coagulation defect, unspecified: Secondary | ICD-10-CM | POA: Diagnosis not present

## 2020-02-05 DIAGNOSIS — N186 End stage renal disease: Secondary | ICD-10-CM | POA: Diagnosis not present

## 2020-02-05 DIAGNOSIS — N2581 Secondary hyperparathyroidism of renal origin: Secondary | ICD-10-CM | POA: Diagnosis not present

## 2020-02-06 DIAGNOSIS — R972 Elevated prostate specific antigen [PSA]: Secondary | ICD-10-CM | POA: Diagnosis not present

## 2020-02-07 DIAGNOSIS — N186 End stage renal disease: Secondary | ICD-10-CM | POA: Diagnosis not present

## 2020-02-07 DIAGNOSIS — D689 Coagulation defect, unspecified: Secondary | ICD-10-CM | POA: Diagnosis not present

## 2020-02-07 DIAGNOSIS — Z992 Dependence on renal dialysis: Secondary | ICD-10-CM | POA: Diagnosis not present

## 2020-02-07 DIAGNOSIS — N2581 Secondary hyperparathyroidism of renal origin: Secondary | ICD-10-CM | POA: Diagnosis not present

## 2020-02-10 ENCOUNTER — Telehealth: Payer: Self-pay | Admitting: *Deleted

## 2020-02-10 DIAGNOSIS — N186 End stage renal disease: Secondary | ICD-10-CM | POA: Diagnosis not present

## 2020-02-10 DIAGNOSIS — N2581 Secondary hyperparathyroidism of renal origin: Secondary | ICD-10-CM | POA: Diagnosis not present

## 2020-02-10 DIAGNOSIS — D689 Coagulation defect, unspecified: Secondary | ICD-10-CM | POA: Diagnosis not present

## 2020-02-10 DIAGNOSIS — Z992 Dependence on renal dialysis: Secondary | ICD-10-CM | POA: Diagnosis not present

## 2020-02-10 NOTE — Telephone Encounter (Signed)
   [facility] Pre-operative Risk Assessment    HEARTCARE STAFF: - Please ensure there is not already an duplicate clearance open for this procedure. - Under Visit Info/Reason for Call, type in Other and utilize the format Clearance MM/DD/YY or Clearance TBD. Do not use dashes or single digits. - If request is for dental extraction, please clarify the # of teeth to be extracted.  Request for surgical clearance:  1. What type of surgery is being performed?  PROSTATE ULTRASOUND BIOPSY   2. When is this surgery scheduled?  03/05/20   3. What type of clearance is required (medical clearance vs. Pharmacy clearance to hold med vs. Both)?  BOTH  4. Are there any medications that need to be held prior to surgery and how long? ASPIRIN   5. Practice name and name of physician performing surgery?  [facility] / DR. Lovena Neighbours   6. What is the office phone number?  [phone]   7.   What is the office fax number?  [phone]  8.   Anesthesia type (None, local, MAC, general) ?     Jeanann Lewandowsky 02/10/2020, 2:48 PM  _________________________________________________________________   (provider comments below)

## 2020-02-10 NOTE — Telephone Encounter (Signed)
Primary Cardiologist:Katarina Meda Coffee, MD  Chart reviewed as part of pre-operative protocol coverage. Because of Michael Dubie Jr.'s past medical history and time since last visit, he/she will require a follow-up visit in order to better assess preoperative cardiovascular risk.  Pre-op covering staff: - Please schedule appointment and call patient to inform them. - Please contact requesting surgeon's office via preferred method (i.e, phone, fax) to inform them of need for appointment prior to surgery.  If applicable, this message will also be routed to pharmacy pool and/or primary cardiologist for input on holding anticoagulant/antiplatelet agent as requested below so that this information is available at time of patient's appointment.   Michael Pelton, NP  02/10/2020, 3:02 PM

## 2020-02-10 NOTE — Telephone Encounter (Signed)
Called and scheduled appt for cardiac clearance 02/18/2020 @ 3:40 PM w/Dr Radford Pax

## 2020-02-12 DIAGNOSIS — Z992 Dependence on renal dialysis: Secondary | ICD-10-CM | POA: Diagnosis not present

## 2020-02-12 DIAGNOSIS — D689 Coagulation defect, unspecified: Secondary | ICD-10-CM | POA: Diagnosis not present

## 2020-02-12 DIAGNOSIS — N2581 Secondary hyperparathyroidism of renal origin: Secondary | ICD-10-CM | POA: Diagnosis not present

## 2020-02-12 DIAGNOSIS — N186 End stage renal disease: Secondary | ICD-10-CM | POA: Diagnosis not present

## 2020-02-13 DIAGNOSIS — T82898A Other specified complication of vascular prosthetic devices, implants and grafts, initial encounter: Secondary | ICD-10-CM | POA: Diagnosis not present

## 2020-02-13 DIAGNOSIS — Z992 Dependence on renal dialysis: Secondary | ICD-10-CM | POA: Diagnosis not present

## 2020-02-13 DIAGNOSIS — I871 Compression of vein: Secondary | ICD-10-CM | POA: Diagnosis not present

## 2020-02-13 DIAGNOSIS — D689 Coagulation defect, unspecified: Secondary | ICD-10-CM | POA: Diagnosis not present

## 2020-02-13 DIAGNOSIS — N186 End stage renal disease: Secondary | ICD-10-CM | POA: Diagnosis not present

## 2020-02-13 DIAGNOSIS — N2581 Secondary hyperparathyroidism of renal origin: Secondary | ICD-10-CM | POA: Diagnosis not present

## 2020-02-14 DIAGNOSIS — N186 End stage renal disease: Secondary | ICD-10-CM | POA: Diagnosis not present

## 2020-02-14 DIAGNOSIS — N2581 Secondary hyperparathyroidism of renal origin: Secondary | ICD-10-CM | POA: Diagnosis not present

## 2020-02-14 DIAGNOSIS — D689 Coagulation defect, unspecified: Secondary | ICD-10-CM | POA: Diagnosis not present

## 2020-02-14 DIAGNOSIS — Z992 Dependence on renal dialysis: Secondary | ICD-10-CM | POA: Diagnosis not present

## 2020-02-17 DIAGNOSIS — Z992 Dependence on renal dialysis: Secondary | ICD-10-CM | POA: Diagnosis not present

## 2020-02-17 DIAGNOSIS — D689 Coagulation defect, unspecified: Secondary | ICD-10-CM | POA: Diagnosis not present

## 2020-02-17 DIAGNOSIS — N186 End stage renal disease: Secondary | ICD-10-CM | POA: Diagnosis not present

## 2020-02-17 DIAGNOSIS — Z1211 Encounter for screening for malignant neoplasm of colon: Secondary | ICD-10-CM | POA: Diagnosis not present

## 2020-02-17 DIAGNOSIS — N2581 Secondary hyperparathyroidism of renal origin: Secondary | ICD-10-CM | POA: Diagnosis not present

## 2020-02-17 NOTE — Progress Notes (Signed)
Cardiology Office Note   Date:  02/20/2020   ID:  Michael [name]., DOB 1956-03-14, [name] [id number]  PCP:  [name], MD  Cardiologist: Dr. Meda Coffee, MD  Chief Complaint  Patient presents with  . Follow-up  . Pre-op Exam    History of Present Illness: Michael [name]. is a 64 y.o. male who presents for preoperative clearance for prostate ultrasound biopsy 03/05/20, seen for Dr. Lovena Le.   [name] first saw [facility] in 2015 after an admission for symptomatic, sustained VT. Echo at that time showed EF 20 to 25%.  LHC performed which showed normal coronary arteries. He was started on amiodarone with improvement and had no further VT. At follow-up repeat echo showed EF of 25 to 30%.  Nitrates were started. Planned to discuss ICD but patient was lost to follow-up for quite some time.  He was once again see by Dr. Lovena Le 01/2019 for evaluation of a flutter/tachycardia prior to vascular procedure. He ultimately underwent AF ablation 04/2019 with post procedural sinus node dysfunction therefore his BB was held with improvement in his rates by follow up 05/21/2019.  More recently, he was scheduled to have right AV fistula by vascular surgery on 05/29/2019 however prior to incision he had a cardiac arrest in the OR. He became hypotensive and hypoxic. He received 4 minutes of CPR with ROSC.   He was then rseen by Dr. Lovena Le 08/07/19 who discussed ICD implantation due to severe cardiomyopathy however this has been  deferred until fistula placed for HD as cathter HD felt to be at higher risk of infection.   Today he presents for preoperative clearance and is found to be in atrial fibrillation per EKG. He has never been on anticoagulation per chart review he has never been placed on [facility]. CHA2DS2VASc score appears to be at least a 2 (HTN, CHF). Case discussed with Dr. Quentin Ore who feel recommended deferring starting [facility] to Dr. Forde Dandy decision in regards to his upcoming procedure scheduled for  03/05/20. Plan will be to either start Eliquis and hold 2 days prior to procedure per pharmacy recommendations pr hold off starting until after procedure and cleared by urology.   The patient does not have any unstable cardiac conditions. Upon evaluation today, he can achieve 4 METs or greater without anginal symptoms. He reports walking at least 1-2 miles per day without SOB, chest pain, LE edema or palpitations. According to [facility] and AHA guidelines, he requires no further cardiac workup prior surgery and should be at acceptable risk given relatively low risk procedure. He may hold ASA for 3-5 days prior to procedure and resume when safe per procedural team.    Past Medical History:  Diagnosis Date  . Atrial arrhythmia    atrial tachycardia with variable AV conduction versus atypical aflutter 01/10/19, rate control (02/06/19)  . Cataract   . Dyspnea    on exertion  . ESRD (end stage renal disease) ([place])    Hemo- MWF, Polycystic kidney disease  . Fatigue   . History of kidney stones    removal of stone- cysto  . Hyperlipidemia   . Hyperparathyroidism, secondary renal ([place])   . Hypertension   . Hypoxemia 12/12/2013  . Nonischemic cardiomyopathy ([place])    Er 25% 2015, 55 % 2013  . OSA on CPAP    no longer using cpap  . OSA on CPAP 03/24/2014  . Pneumonia    2015ish  . Ventricular tachycardia//Freq PVCs   . Wears glasses  Past Surgical History:  Procedure Laterality Date  . A-FLUTTER ABLATION N/A 04/11/2019   Procedure: A-FLUTTER ABLATION;  Surgeon: Evans Lance, MD;  Location: [place] [facility];  Service: Cardiovascular;  Laterality: N/A;  . A/V FISTULAGRAM Left 04/27/2017   Procedure: A/V FISTULAGRAM;  Surgeon: Conrad Frank, MD;  Location: [place] [facility];  Service: Cardiovascular;  Laterality: Left;  lt arm  . A/V FISTULAGRAM Left 01/10/2019   Procedure: A/V FISTULAGRAM;  Surgeon: Marty Heck, MD;  Location: [place] [facility];  Service: Cardiovascular;  Laterality:  Left;  . APPENDECTOMY    . AV FISTULA PLACEMENT  12/05/2011   Procedure: ARTERIOVENOUS (AV) FISTULA CREATION;LLEFT ARM  Surgeon: Conrad Claypool, MD;  Location: [place];  Service: Vascular;  Laterality: Left;  RADIO-CEPHALIC  fistula left arm  . AV FISTULA PLACEMENT  01/11/2012   Procedure: ARTERIOVENOUS (AV) FISTULA CREATION;  Surgeon: Conrad [place], MD;  Location: [place];  Service: Vascular;  Laterality: Left;  Creation of left brachial cephalic arteriovenous fistula  . AV FISTULA PLACEMENT Right 03/07/2019   Procedure: ARTERIOVENOUS (AV) FISTULA CREATION  RIGHT ARM;  Surgeon: Marty Heck, MD;  Location: [place];  Service: Vascular;  Laterality: Right;  . BASCILIC VEIN TRANSPOSITION Left 12/27/2016   Procedure: BASILIC VEIN TRANSPOSITION LEFT UPPER ARM FIRST STAGE;  Surgeon: Conrad Pecos, MD;  Location: [place];  Service: Vascular;  Laterality: Left;  . BASCILIC VEIN TRANSPOSITION Left 01/31/2017   Procedure: LEFT ARM BASILIC VEIN TRANSPOSITION, SECOND STAGE;  Surgeon: Conrad [place], MD;  Location: [place];  Service: Vascular;  Laterality: Left;  . CARDIAC CATHETERIZATION  04-05-2010   checking for blockage but none-[facility]  . COLONOSCOPY    . CYSTOSCOPY W/ STONE MANIPULATION     "laser once" (01/22/2013)  . HEMATOMA EVACUATION Left 05/09/2017   Procedure: EVACUATION HEMATOMA LEFT ARM;  Surgeon: Conrad [place]iston, MD;  Location: [place];  Service: Vascular;  Laterality: Left;  . HERNIA REPAIR    . INGUINAL HERNIA REPAIR Right 11/06/2015   Procedure: OPEN HERNIA REPAIR  RIGHT INGUINAL ADULT;  Surgeon: [name], MD;  Location: [facility];  Service: General;  Laterality: Right;  with MESH  . INSERTION OF DIALYSIS CATHETER Right 10/05/2016   Procedure: INSERTION OF right internal jugular DIALYSIS CATHETER;  Surgeon: [name], MD;  Location: [place];  Service: Vascular;  Laterality: Right;  . INSERTION OF MESH  03/20/2012   Procedure: INSERTION OF MESH;  UMB Surgeon: [name], MD;  Location: [place];   Service: General;  Laterality: N/A;  . INSERTION OF MESH N/A 01/22/2013   Procedure: INSERTION OF MESH;  Surgeon: [name], MD;  Location: [place];  Service: General;  Laterality: N/A;  . LAPAROTOMY  04/02/2012   Procedure: EXPLORATORY LAPAROTOMY;  Surgeon: [name], MD;  Location: [place];  Service: General;  Laterality: N/A;  Exploratory Laparotomy with resection of small intestine  . LEFT HEART CATHETERIZATION WITH CORONARY ANGIOGRAM N/A 05/14/2013   Procedure: LEFT HEART CATHETERIZATION WITH CORONARY ANGIOGRAM;  Surgeon: Sinclair Grooms, MD;  Location: [facility];  Service: Cardiovascular;  Laterality: N/A;  . LIGATION OF ARTERIOVENOUS  FISTULA Left 12/27/2016   Procedure: LIGATION/EXCISION OF LEFT UPPER ARM ARTERIOVENOUS  FISTULA;  EVACUATION OF HEMATOMA;  Surgeon: Conrad Marietta, MD;  Location: [place];  Service: Vascular;  Laterality: Left;  . LIGATION OF ARTERIOVENOUS  FISTULA Left 03/07/2019   Procedure: LIGATION OF ARTERIOVENOUS FISTULA  LEFT UPPER  ARM;  Surgeon: Marty Heck, MD;  Location: [place];  Service: Vascular;  Laterality: Left;  . REVISON OF ARTERIOVENOUS FISTULA Left 10/05/2016   Procedure: REVISON OF left arm ARTERIOVENOUS FISTULA;  Surgeon: [name], MD;  Location: [place];  Service: Vascular;  Laterality: Left;  . TONSILLECTOMY    . UMBILICAL HERNIA REPAIR  03/20/2012   Procedure: HERNIA REPAIR UMBILICAL ADULT;  Surgeon: [name], MD;  Location: [place];  Service: General;  Laterality: N/A;  . UMBILICAL HERNIA REPAIR  01/22/2013   preperitoneal open procedure due to significant adhesions/notes 01/22/2013  . VENTRAL HERNIA REPAIR N/A 01/22/2013   Procedure: ATTEMPTED LAPAROSCOPIC VENTRAL HERNIA CONVERTED TO OPEN;  Surgeon: [name], MD;  Location: [place];  Service: General;  Laterality: N/A;     Current Outpatient Medications  Medication Sig Dispense Refill  . acetaminophen (TYLENOL) 325 MG tablet Take 1-2 tablets (325-650 mg total) by  mouth every 4 (four) hours as needed for mild pain.    [name] acyclovir (ZOVIRAX) 400 MG tablet Take 1 tablet (400 mg total) by mouth daily with lunch. 30 tablet 1  . aspirin EC 81 MG EC tablet Take 1 tablet (81 mg total) by mouth daily.    [name] atorvastatin (LIPITOR) 40 MG tablet Take 40 mg by mouth at bedtime.     . metoprolol succinate (TOPROL-XL) 25 MG 24 hr tablet Take 0.5 tablets (12.5 mg total) by mouth daily. 45 tablet 3  . multivitamin (RENA-VIT) TABS tablet Take 1 tablet by mouth at bedtime. 30 tablet 0  . nitroGLYCERIN (NITROSTAT) 0.4 MG SL tablet Place 1 tablet (0.4 mg total) under the tongue every 5 (five) minutes x 3 doses as needed for chest pain. 30 tablet 12  . pantoprazole (PROTONIX) 40 MG tablet Take 1 tablet (40 mg total) by mouth daily. 30 tablet 0  . prednisoLONE acetate (PRED FORTE) 1 % ophthalmic suspension Place 1 drop into the right eye daily at 12 noon.     . sevelamer carbonate (RENVELA) 800 MG tablet Take 2 tablets (1,600 mg total) by mouth 3 (three) times daily with meals. 180 tablet 0   No current facility-administered medications for this visit.    Allergies:   Patient has no known allergies.    Social History:  The patient  reports that he has never smoked. He has never used smokeless tobacco. He reports that he does not drink alcohol and does not use drugs.   Family History:  The patient's family history includes Heart disease in his mother; Hyperlipidemia in his mother; Hypertension in his mother; Kidney disease in his brother and father; Stroke in his father.    ROS:  Please see the history of present illness. Otherwise, review of systems are positive for none.   All other systems are reviewed and negative.    PHYSICAL EXAM: VS:  BP 104/64   Pulse 71   Ht 5\' 6"  (1.676 m)   Wt 181 lb (82.1 kg)   SpO2 98%   BMI 29.21 kg/m  , BMI Body mass index is 29.21 kg/m.   General: Well developed, well nourished, NAD Neck: Negative for carotid bruits. No  JVD Lungs:Clear to ausculation bilaterally. No wheezes, rales, or rhonchi. Breathing is unlabored. Cardiovascular: Irregularly irregular. No murmurs Extremities: No edema. Radial pulses 2+ bilaterally Neuro: Alert and oriented. No focal deficits. No facial asymmetry. MAE spontaneously. Psych: Responds to questions appropriately with normal affect.     EKG:  EKG is ordered today. The  ekg ordered today demonstrates Atrial fibrillation with HR 76bpm   Recent Labs: 06/01/2019: ALT 6 06/02/2019: Magnesium 2.4 06/10/2019: BUN 37; Creatinine, Ser 10.66; Hemoglobin 13.0; Platelets 145; Potassium 4.6; Sodium 137    Lipid Panel    Component Value Date/Time   TRIG 105 06/01/2019 0418     Wt Readings from Last 3 Encounters:  02/20/20 181 lb (82.1 kg)  08/07/19 170 lb (77.1 kg)  06/25/19 173 lb 12.8 oz (78.8 kg)     Other studies Reviewed: Additional studies/ records that were reviewed today include: Review of the above records demonstrates:   Event Monitor Summary 05/23/2019  1. NSR with sinus bradycardia 2. PAF with RVR and CVR 3. NSVT 4. No prolonged pauses Gregg Taylor,M.D.  Echocardiogram 06/04/2019 1. Left ventricular ejection fraction, by visual estimation, is <20%. The left ventricle has severely decreased function. There is no left ventricular hypertrophy. 2. Severely dilated left ventricular internal cavity size. 3. The left ventricle demonstrates global hypokinesis. 4. Left ventricular diastolic parameters are consistent with Grade II diastolic dysfunction (pseudonormalization). 5. Global right ventricle has normal systolicnction.The right ventricular size is normal. No increase in right ventricular wall thickness. 6. Left atrial size was severely dilated. 7. Right atrial size was severely dilated. 8. The mitral valve is normal in structure. Trivial mitral valve regurgitation. No evidence of mitral stenosis. 9. The tricuspid valve is normal in structure. Tricuspid valve  regurgitation is mild. 10. The aortic valve is tricuspid. Aortic valve regurgitation is mild. Moderate aortic valve sclerosis/calcification without any evidence of aortic stenosis. 11. The pulmonic valve was normal in structure. Pulmonic valve regurgitation is not visualized. 12. There is mild dilatation of the aortic root measuring 41 mm. 13. Normal pulmonary artery systolic pressure. 14. The inferior vena cava is dilated in size with >50% respiratory variability, suggesting right atrial pressure of 8 mmHg. 15. The tricuspid regurgitant velocity is 2.04 m/s, and with an assumed right atrial pressure of 50mmHg, the estimated right ventricular systolic pressure is normal at 24.6 mmHg. 16. Small pericardial effusion. 17. The pericardial effusion is posterior to the left ventricle  Atrial flutter catheter ablation 04/11/2019 CONCLUSIONS:  1. Isthmus-dependent right atrial flutter upon presentation.  2. Successful radiofrequency ablation of atrial flutter along the cavotricuspid isthmus with complete bidirectional isthmus block achieved.  3. No inducible arrhythmias following ablation.  4. No early apparent complications.  Cristopher Peru, MD   Echo 07/23/13: - Left ventricle: The cavity size was mildly dilated. Wall thickness was normal. Systolic function was severely reduced. The estimated ejection fraction was in the range of 25% to 30%. There is akinesis of the posterior myocardium. There is severe hypokinesis of the lateral myocardium. There is hypokinesis of the anterior myocardium. Doppler parameters are consistent with abnormal left ventricular relaxation (grade 1 diastolic dysfunction). - Aortic valve: Mild regurgitation. - Aortic root: The aortic root was mildly dilated. - Left atrium: The atrium was mildly dilated.  Cardiac cath 05/14/13: 1. Nonischemic cardiomyopathywith reduced EF less than 25% and severely elevated left ventricular end-diastolic pressures 2. Widely patent  coronary arteries   ASSESSMENT AND PLAN:  1. Hx of atrial tachy/atrial flutter>>>now with atrial fibrillation per EKG: -Pt has a prior hx of AF ablation per Dr. Lovena Le. He was seen today for preoperative clearance and incidentally was found to be in atrial fibrillation with rate control per EKG.  -Case discussed with Dr. Quentin Ore who feel recommended deferring starting [facility] to Dr. Forde Dandy decision in regards to his upcoming procedure scheduled for 03/05/20.  -  Plan will be to either start Eliquis and hold 2 days prior to procedure per pharmacy recommendations pr hold off starting until after procedure and cleared by urology.  -Needs close follow up with EP>>>>I will personally reach out to the patient once I hear about his decision to start or hold AC until after procedure 03/05/20.   2. Preoperative clearance for prostate biopsy: -The patient does not have any unstable cardiac conditions. Upon evaluation today, he can achieve 4 METs or greater without anginal symptoms. He reports walking at least 1-2 miles per day without SOB, chest pain, LE edema or palpitations. According to [facility] and AHA guidelines, he requires no further cardiac workup prior surgery and should be at acceptable risk given relatively low risk procedure. He may hold ASA for 3-5 days prior to procedure and resume when safe per procedural team.    3. Hx of VT: -Follows with Dr. Lovena Le with prior discussion regarding ICD placemet however plan is to defer until after AV fistula placed due to high risk of infection  -On Toprol>>no symptoms   4. ESRD: -On HD with plans for renal transplant if EF improves -Repeat echocardiogram   5. NICM: -Last echo with EF <20%>>plans for ICD per Dr. Lovena Le  -Appears fluid volume balanced on exam  -No SOB, edema -Repeat echocardiogram   Current medicines are reviewed at length with the patient today.  The patient does not have concerns regarding medicines.  The following changes have been made:  no  change  Labs/ tests ordered today include: BMET, echo  No orders of the defined types were placed in this encounter.   Disposition:   FU with EP in 2 weeks  Signed, Kathyrn Drown, NP  02/20/2020 2:58 PM    [address], [place], [place]  [address] Phone: [phone]; Fax: [phone]

## 2020-02-18 ENCOUNTER — Ambulatory Visit: Payer: Medicare Other | Admitting: Cardiology

## 2020-02-19 DIAGNOSIS — N2581 Secondary hyperparathyroidism of renal origin: Secondary | ICD-10-CM | POA: Diagnosis not present

## 2020-02-19 DIAGNOSIS — D689 Coagulation defect, unspecified: Secondary | ICD-10-CM | POA: Diagnosis not present

## 2020-02-19 DIAGNOSIS — Z992 Dependence on renal dialysis: Secondary | ICD-10-CM | POA: Diagnosis not present

## 2020-02-19 DIAGNOSIS — N186 End stage renal disease: Secondary | ICD-10-CM | POA: Diagnosis not present

## 2020-02-20 ENCOUNTER — Encounter: Payer: Self-pay | Admitting: Cardiology

## 2020-02-20 ENCOUNTER — Ambulatory Visit (INDEPENDENT_AMBULATORY_CARE_PROVIDER_SITE_OTHER): Payer: Medicare Other | Admitting: Cardiology

## 2020-02-20 ENCOUNTER — Other Ambulatory Visit: Payer: Self-pay

## 2020-02-20 VITALS — BP 104/64 | HR 71 | Ht 66.0 in | Wt 181.0 lb

## 2020-02-20 DIAGNOSIS — I10 Essential (primary) hypertension: Secondary | ICD-10-CM | POA: Diagnosis not present

## 2020-02-20 DIAGNOSIS — R002 Palpitations: Secondary | ICD-10-CM

## 2020-02-20 DIAGNOSIS — I472 Ventricular tachycardia, unspecified: Secondary | ICD-10-CM

## 2020-02-20 DIAGNOSIS — I471 Supraventricular tachycardia: Secondary | ICD-10-CM | POA: Diagnosis not present

## 2020-02-20 DIAGNOSIS — I4892 Unspecified atrial flutter: Secondary | ICD-10-CM

## 2020-02-20 DIAGNOSIS — I428 Other cardiomyopathies: Secondary | ICD-10-CM | POA: Diagnosis not present

## 2020-02-20 NOTE — Patient Instructions (Addendum)
Medication Instructions:  Your physician recommends that you continue on your current medications as directed. Please refer to the Current Medication list given to you today.  *If you need a refill on your cardiac medications before your next appointment, please call your pharmacy*   Lab Work: BMET today If you have labs (blood work) drawn today and your tests are completely normal, you will receive your results only by: [name] MyChart Message (if you have MyChart) OR . A paper copy in the mail If you have any lab test that is abnormal or we need to change your treatment, we will call you to review the results.   Testing/Procedures: Your physician has requested that you have an echocardiogram within the next few weeks. Echocardiography is a painless test that uses sound waves to create images of your heart. It provides your doctor with information about the size and shape of your heart and how well your heart's chambers and valves are working. This procedure takes approximately one hour. There are no restrictions for this procedure.     Follow-Up: At [facility], you and your health needs are our priority.  As part of our continuing mission to provide you with exceptional heart care, we have created designated Provider Care Teams.  These Care Teams include your primary Cardiologist (physician) and Advanced Practice Providers (APPs -  Physician Assistants and Nurse Practitioners) who all work together to provide you with the care you need, when you need it.  We recommend signing up for the patient portal called "MyChart".  Sign up information is provided on this After Visit Summary.  MyChart is used to connect with patients for Virtual Visits (Telemedicine).  Patients are able to view lab/test results, encounter notes, upcoming appointments, etc.  Non-urgent messages can be sent to your provider as well.   To learn more about what you can do with MyChart, go to [email or web address].      Your next appointment:    We will arrange a follow up appointment with Dr Lovena Le or an EP APP and an ECHO within the next few weeks.

## 2020-02-21 ENCOUNTER — Telehealth: Payer: Self-pay

## 2020-02-21 DIAGNOSIS — N186 End stage renal disease: Secondary | ICD-10-CM | POA: Diagnosis not present

## 2020-02-21 DIAGNOSIS — N2581 Secondary hyperparathyroidism of renal origin: Secondary | ICD-10-CM | POA: Diagnosis not present

## 2020-02-21 DIAGNOSIS — D689 Coagulation defect, unspecified: Secondary | ICD-10-CM | POA: Diagnosis not present

## 2020-02-21 DIAGNOSIS — Z992 Dependence on renal dialysis: Secondary | ICD-10-CM | POA: Diagnosis not present

## 2020-02-21 LAB — BASIC METABOLIC PANEL
BUN/Creatinine Ratio: 3 — ABNORMAL LOW (ref 10–24)
BUN: 28 mg/dL — ABNORMAL HIGH (ref 8–27)
CO2: 29 mmol/L (ref 20–29)
Calcium: 10 mg/dL (ref 8.6–10.2)
Chloride: 92 mmol/L — ABNORMAL LOW (ref 96–106)
Creatinine, Ser: 8.65 mg/dL — ABNORMAL HIGH (ref 0.76–1.27)
GFR calc Af Amer: 7 mL/min/{1.73_m2} — ABNORMAL LOW (ref >59)
GFR calc non Af Amer: 6 mL/min/{1.73_m2} — ABNORMAL LOW (ref >59)
Glucose: 77 mg/dL (ref 65–99)
Potassium: 4.3 mmol/L (ref 3.5–5.2)
Sodium: 138 mmol/L (ref 134–144)

## 2020-02-21 MED ORDER — APIXABAN 5 MG PO TABS
5.0000 mg | ORAL_TABLET | Freq: Two times a day (BID) | ORAL | 3 refills | Status: DC
Start: 2020-02-21 — End: 2021-03-05

## 2020-02-21 NOTE — Telephone Encounter (Signed)
Eliquis 5mg  po bid sent to pharmacy on file  Tommie Raymond, NP  P Cv [place] Triage Can please start Eliquis 5mg  PO BID for atrial fibrillation (needs to be sent). I spoke at length to him during our [place] yesterday about this. He will need to hold Eliquis 2 days prior to his upcoming biopsy then resume when procedural team tells him that it is safe. He need to keep follow up with Dr. Lovena Le in November. I will let him know if we need to change anything further    Thank you  Sharee Pimple

## 2020-02-21 NOTE — Telephone Encounter (Signed)
Spoke with pt and advised per Michael [name] pt will need to begin Eliquis 5mg  1 tablet po bid.  He will need to hold Eliquis 2 days prior to upcoming biopsy and will resume when procedural team advises him it is safe to restart.  Pt advised to keep appointment with Dr Lovena Le in November.   Pt asking about Eliquis side effects.  Pt advised re: increase in bleeding if he cuts himself or has a nose bleed.  Also advised nausea can be a common side effect at first and to watch for any allergic reaction such as rash, itching, swelling of face or tongue and difficulty breathing.  Pt verbalizes understanding and agrees with current plan.

## 2020-02-21 NOTE — Telephone Encounter (Signed)
Michael [name] is calling stating the Eliquis cost $150, he is requesting a coupon. Please advise.

## 2020-02-21 NOTE — Telephone Encounter (Signed)
Spoke with the patient and he is asking for a coupon for Eliquis because it is too expensive. I have left samples and a co-pay card for the patient to pick up on Monday.

## 2020-02-24 DIAGNOSIS — Z992 Dependence on renal dialysis: Secondary | ICD-10-CM | POA: Diagnosis not present

## 2020-02-24 DIAGNOSIS — D689 Coagulation defect, unspecified: Secondary | ICD-10-CM | POA: Diagnosis not present

## 2020-02-24 DIAGNOSIS — N2581 Secondary hyperparathyroidism of renal origin: Secondary | ICD-10-CM | POA: Diagnosis not present

## 2020-02-24 DIAGNOSIS — N186 End stage renal disease: Secondary | ICD-10-CM | POA: Diagnosis not present

## 2020-02-26 DIAGNOSIS — Z992 Dependence on renal dialysis: Secondary | ICD-10-CM | POA: Diagnosis not present

## 2020-02-26 DIAGNOSIS — D689 Coagulation defect, unspecified: Secondary | ICD-10-CM | POA: Diagnosis not present

## 2020-02-26 DIAGNOSIS — N2581 Secondary hyperparathyroidism of renal origin: Secondary | ICD-10-CM | POA: Diagnosis not present

## 2020-02-26 DIAGNOSIS — N186 End stage renal disease: Secondary | ICD-10-CM | POA: Diagnosis not present

## 2020-02-28 DIAGNOSIS — N186 End stage renal disease: Secondary | ICD-10-CM | POA: Diagnosis not present

## 2020-02-28 DIAGNOSIS — Z992 Dependence on renal dialysis: Secondary | ICD-10-CM | POA: Diagnosis not present

## 2020-02-28 DIAGNOSIS — D689 Coagulation defect, unspecified: Secondary | ICD-10-CM | POA: Diagnosis not present

## 2020-02-28 DIAGNOSIS — N2581 Secondary hyperparathyroidism of renal origin: Secondary | ICD-10-CM | POA: Diagnosis not present

## 2020-03-02 DIAGNOSIS — Z992 Dependence on renal dialysis: Secondary | ICD-10-CM | POA: Diagnosis not present

## 2020-03-02 DIAGNOSIS — N2581 Secondary hyperparathyroidism of renal origin: Secondary | ICD-10-CM | POA: Diagnosis not present

## 2020-03-02 DIAGNOSIS — N186 End stage renal disease: Secondary | ICD-10-CM | POA: Diagnosis not present

## 2020-03-02 DIAGNOSIS — Q612 Polycystic kidney, adult type: Secondary | ICD-10-CM | POA: Diagnosis not present

## 2020-03-04 DIAGNOSIS — N2581 Secondary hyperparathyroidism of renal origin: Secondary | ICD-10-CM | POA: Diagnosis not present

## 2020-03-04 DIAGNOSIS — Z992 Dependence on renal dialysis: Secondary | ICD-10-CM | POA: Diagnosis not present

## 2020-03-04 DIAGNOSIS — N186 End stage renal disease: Secondary | ICD-10-CM | POA: Diagnosis not present

## 2020-03-05 DIAGNOSIS — R972 Elevated prostate specific antigen [PSA]: Secondary | ICD-10-CM | POA: Diagnosis not present

## 2020-03-05 DIAGNOSIS — C61 Malignant neoplasm of prostate: Secondary | ICD-10-CM | POA: Diagnosis not present

## 2020-03-05 DIAGNOSIS — N4232 Atypical small acinar proliferation of prostate: Secondary | ICD-10-CM | POA: Diagnosis not present

## 2020-03-07 DIAGNOSIS — Z992 Dependence on renal dialysis: Secondary | ICD-10-CM | POA: Diagnosis not present

## 2020-03-07 DIAGNOSIS — N186 End stage renal disease: Secondary | ICD-10-CM | POA: Diagnosis not present

## 2020-03-07 DIAGNOSIS — N2581 Secondary hyperparathyroidism of renal origin: Secondary | ICD-10-CM | POA: Diagnosis not present

## 2020-03-09 DIAGNOSIS — N186 End stage renal disease: Secondary | ICD-10-CM | POA: Diagnosis not present

## 2020-03-09 DIAGNOSIS — Z992 Dependence on renal dialysis: Secondary | ICD-10-CM | POA: Diagnosis not present

## 2020-03-09 DIAGNOSIS — N2581 Secondary hyperparathyroidism of renal origin: Secondary | ICD-10-CM | POA: Diagnosis not present

## 2020-03-10 ENCOUNTER — Ambulatory Visit (HOSPITAL_COMMUNITY): Payer: Medicare Other | Attending: Cardiology

## 2020-03-10 ENCOUNTER — Other Ambulatory Visit: Payer: Self-pay

## 2020-03-10 DIAGNOSIS — R002 Palpitations: Secondary | ICD-10-CM

## 2020-03-10 DIAGNOSIS — I4892 Unspecified atrial flutter: Secondary | ICD-10-CM | POA: Diagnosis not present

## 2020-03-10 DIAGNOSIS — I10 Essential (primary) hypertension: Secondary | ICD-10-CM

## 2020-03-10 DIAGNOSIS — I471 Supraventricular tachycardia: Secondary | ICD-10-CM | POA: Diagnosis not present

## 2020-03-10 DIAGNOSIS — I428 Other cardiomyopathies: Secondary | ICD-10-CM

## 2020-03-10 DIAGNOSIS — I4719 Other supraventricular tachycardia: Secondary | ICD-10-CM

## 2020-03-10 DIAGNOSIS — I472 Ventricular tachycardia, unspecified: Secondary | ICD-10-CM

## 2020-03-10 LAB — ECHOCARDIOGRAM COMPLETE
P 1/2 time: 435 msec
S' Lateral: 6.8 cm

## 2020-03-10 MED ORDER — PERFLUTREN LIPID MICROSPHERE
1.0000 mL | INTRAVENOUS | Status: AC | PRN
  Administered 2020-03-10: 2 mL via INTRAVENOUS

## 2020-03-11 DIAGNOSIS — N2581 Secondary hyperparathyroidism of renal origin: Secondary | ICD-10-CM | POA: Diagnosis not present

## 2020-03-11 DIAGNOSIS — N186 End stage renal disease: Secondary | ICD-10-CM | POA: Diagnosis not present

## 2020-03-11 DIAGNOSIS — Z992 Dependence on renal dialysis: Secondary | ICD-10-CM | POA: Diagnosis not present

## 2020-03-13 DIAGNOSIS — C61 Malignant neoplasm of prostate: Secondary | ICD-10-CM | POA: Diagnosis not present

## 2020-03-13 DIAGNOSIS — N186 End stage renal disease: Secondary | ICD-10-CM | POA: Diagnosis not present

## 2020-03-13 DIAGNOSIS — N2581 Secondary hyperparathyroidism of renal origin: Secondary | ICD-10-CM | POA: Diagnosis not present

## 2020-03-13 DIAGNOSIS — Z992 Dependence on renal dialysis: Secondary | ICD-10-CM | POA: Diagnosis not present

## 2020-03-16 DIAGNOSIS — N186 End stage renal disease: Secondary | ICD-10-CM | POA: Diagnosis not present

## 2020-03-16 DIAGNOSIS — Z992 Dependence on renal dialysis: Secondary | ICD-10-CM | POA: Diagnosis not present

## 2020-03-16 DIAGNOSIS — N2581 Secondary hyperparathyroidism of renal origin: Secondary | ICD-10-CM | POA: Diagnosis not present

## 2020-03-17 ENCOUNTER — Encounter: Payer: Self-pay | Admitting: Internal Medicine

## 2020-03-17 ENCOUNTER — Other Ambulatory Visit: Payer: Self-pay

## 2020-03-17 ENCOUNTER — Ambulatory Visit (INDEPENDENT_AMBULATORY_CARE_PROVIDER_SITE_OTHER): Payer: Medicare Other | Admitting: Internal Medicine

## 2020-03-17 VITALS — BP 126/70 | HR 84 | Ht 66.0 in | Wt 187.0 lb

## 2020-03-17 DIAGNOSIS — I472 Ventricular tachycardia, unspecified: Secondary | ICD-10-CM

## 2020-03-17 DIAGNOSIS — I4892 Unspecified atrial flutter: Secondary | ICD-10-CM

## 2020-03-17 NOTE — Patient Instructions (Signed)
Medication Instructions:  Your physician recommends that you continue on your current medications as directed. Please refer to the Current Medication list given to you today.  *If you need a refill on your cardiac medications before your next appointment, please call your pharmacy*   Lab Work: None ordered.  If you have labs (blood work) drawn today and your tests are completely normal, you will receive your results only by: [name] MyChart Message (if you have MyChart) OR . A paper copy in the mail If you have any lab test that is abnormal or we need to change your treatment, we will call you to review the results.   Testing/Procedures: None ordered.    Follow-Up: At [facility], you and your health needs are our priority.  As part of our continuing mission to provide you with exceptional heart care, we have created designated Provider Care Teams.  These Care Teams include your primary Cardiologist (physician) and Advanced Practice Providers (APPs -  Physician Assistants and Nurse Practitioners) who all work together to provide you with the care you need, when you need it.  We recommend signing up for the patient portal called "MyChart".  Sign up information is provided on this After Visit Summary.  MyChart is used to connect with patients for Virtual Visits (Telemedicine).  Patients are able to view lab/test results, encounter notes, upcoming appointments, etc.  Non-urgent messages can be sent to your provider as well.   To learn more about what you can do with MyChart, go to [email or web address].    Your next appointment:   6 month(s)  The format for your next appointment:   In Person  Provider:   Cristopher Peru, MD

## 2020-03-17 NOTE — Progress Notes (Signed)
HPI Mr. Stockman returns today for followup. He is a pleasant 64 yo man with ESRD on HD, prior VT, with indwelling HD catheter and chronic systolic heart failure with an EF of 20-25%. He has class 2A symptoms despite his LV dysfunction. He has been walking regularly. He has not had anginal symptoms. His HD has gone well. He does not have sob before HD and no chest pain after HD. No edema. No syncope. No Known Allergies   Current Outpatient Medications  Medication Sig Dispense Refill  . acyclovir (ZOVIRAX) 400 MG tablet Take 1 tablet (400 mg total) by mouth daily with lunch. 30 tablet 1  . apixaban (ELIQUIS) 5 MG TABS tablet Take 1 tablet (5 mg total) by mouth 2 (two) times daily. 180 tablet 3  . aspirin EC 81 MG EC tablet Take 1 tablet (81 mg total) by mouth daily.    [name] atorvastatin (LIPITOR) 40 MG tablet Take 40 mg by mouth at bedtime.     . nitroGLYCERIN (NITROSTAT) 0.4 MG SL tablet Place 1 tablet (0.4 mg total) under the tongue every 5 (five) minutes x 3 doses as needed for chest pain. 30 tablet 12  . prednisoLONE acetate (PRED FORTE) 1 % ophthalmic suspension Place 1 drop into the right eye daily at 12 noon.     . sevelamer carbonate (RENVELA) 800 MG tablet Take 2 tablets (1,600 mg total) by mouth 3 (three) times daily with meals. 180 tablet 0   No current facility-administered medications for this visit.     Past Medical History:  Diagnosis Date  . Atrial arrhythmia    atrial tachycardia with variable AV conduction versus atypical aflutter 01/10/19, rate control (02/06/19)  . Cataract   . Dyspnea    on exertion  . ESRD (end stage renal disease) ([place])    Hemo- MWF, Polycystic kidney disease  . Fatigue   . History of kidney stones    removal of stone- cysto  . Hyperlipidemia   . Hyperparathyroidism, secondary renal ([place])   . Hypertension   . Hypoxemia 12/12/2013  . Nonischemic cardiomyopathy ([place])    Er 25% 2015, 55 % 2013  . OSA on CPAP    no longer using cpap  . OSA  on CPAP 03/24/2014  . Pneumonia    2015ish  . Ventricular tachycardia//Freq PVCs   . Wears glasses     ROS:   All systems reviewed and negative except as noted in the HPI.   Past Surgical History:  Procedure Laterality Date  . A-FLUTTER ABLATION N/A 04/11/2019   Procedure: A-FLUTTER ABLATION;  Surgeon: Evans Lance, MD;  Location: [place] [facility];  Service: Cardiovascular;  Laterality: N/A;  . A/V FISTULAGRAM Left 04/27/2017   Procedure: A/V FISTULAGRAM;  Surgeon: Conrad [place] Creek, MD;  Location: [place] [facility];  Service: Cardiovascular;  Laterality: Left;  lt arm  . A/V FISTULAGRAM Left 01/10/2019   Procedure: A/V FISTULAGRAM;  Surgeon: Marty Heck, MD;  Location: [place] [facility];  Service: Cardiovascular;  Laterality: Left;  . APPENDECTOMY    . AV FISTULA PLACEMENT  12/05/2011   Procedure: ARTERIOVENOUS (AV) FISTULA CREATION;LLEFT ARM  Surgeon: Conrad Rayville, MD;  Location: [place];  Service: Vascular;  Laterality: Left;  RADIO-CEPHALIC  fistula left arm  . AV FISTULA PLACEMENT  01/11/2012   Procedure: ARTERIOVENOUS (AV) FISTULA CREATION;  Surgeon: Conrad McNairy, MD;  Location: [place];  Service: Vascular;  Laterality: Left;  Creation of left  brachial cephalic arteriovenous fistula  . AV FISTULA PLACEMENT Right 03/07/2019   Procedure: ARTERIOVENOUS (AV) FISTULA CREATION  RIGHT ARM;  Surgeon: Marty Heck, MD;  Location: [facility];  Service: Vascular;  Laterality: Right;  . BASCILIC VEIN TRANSPOSITION Left 12/27/2016   Procedure: BASILIC VEIN TRANSPOSITION LEFT UPPER ARM FIRST STAGE;  Surgeon: Conrad Ponderay, MD;  Location: [place];  Service: Vascular;  Laterality: Left;  . BASCILIC VEIN TRANSPOSITION Left 01/31/2017   Procedure: LEFT ARM BASILIC VEIN TRANSPOSITION, SECOND STAGE;  Surgeon: Conrad Hayden, MD;  Location: [facility];  Service: Vascular;  Laterality: Left;  . CARDIAC CATHETERIZATION  04-05-2010   checking for blockage but none-[facility]  . COLONOSCOPY    . CYSTOSCOPY W/  STONE MANIPULATION     "laser once" (01/22/2013)  . HEMATOMA EVACUATION Left 05/09/2017   Procedure: EVACUATION HEMATOMA LEFT ARM;  Surgeon: Conrad White Lake, MD;  Location: [place];  Service: Vascular;  Laterality: Left;  . HERNIA REPAIR    . INGUINAL HERNIA REPAIR Right 11/06/2015   Procedure: OPEN HERNIA REPAIR  RIGHT INGUINAL ADULT;  Surgeon: [name], MD;  Location: [facility];  Service: General;  Laterality: Right;  with MESH  . INSERTION OF DIALYSIS CATHETER Right 10/05/2016   Procedure: INSERTION OF right internal jugular DIALYSIS CATHETER;  Surgeon: [name], MD;  Location: [facility];  Service: Vascular;  Laterality: Right;  . INSERTION OF MESH  03/20/2012   Procedure: INSERTION OF MESH;  UMB Surgeon: [name], MD;  Location: [facility];  Service: General;  Laterality: N/A;  . INSERTION OF MESH N/A 01/22/2013   Procedure: INSERTION OF MESH;  Surgeon: [name], MD;  Location: [place];  Service: General;  Laterality: N/A;  . LAPAROTOMY  04/02/2012   Procedure: EXPLORATORY LAPAROTOMY;  Surgeon: [name], MD;  Location: [place];  Service: General;  Laterality: N/A;  Exploratory Laparotomy with resection of small intestine  . LEFT HEART CATHETERIZATION WITH CORONARY ANGIOGRAM N/A 05/14/2013   Procedure: LEFT HEART CATHETERIZATION WITH CORONARY ANGIOGRAM;  Surgeon: Sinclair Grooms, MD;  Location: [facility];  Service: Cardiovascular;  Laterality: N/A;  . LIGATION OF ARTERIOVENOUS  FISTULA Left 12/27/2016   Procedure: LIGATION/EXCISION OF LEFT UPPER ARM ARTERIOVENOUS  FISTULA;  EVACUATION OF HEMATOMA;  Surgeon: Conrad Elliott, MD;  Location: [place];  Service: Vascular;  Laterality: Left;  . LIGATION OF ARTERIOVENOUS  FISTULA Left 03/07/2019   Procedure: LIGATION OF ARTERIOVENOUS FISTULA  LEFT UPPER ARM;  Surgeon: Marty Heck, MD;  Location: [place];  Service: Vascular;  Laterality: Left;  . REVISON OF ARTERIOVENOUS FISTULA Left 10/05/2016   Procedure: REVISON OF left arm  ARTERIOVENOUS FISTULA;  Surgeon: [name], MD;  Location: [place];  Service: Vascular;  Laterality: Left;  . TONSILLECTOMY    . UMBILICAL HERNIA REPAIR  03/20/2012   Procedure: HERNIA REPAIR UMBILICAL ADULT;  Surgeon: [name], MD;  Location: [place];  Service: General;  Laterality: N/A;  . UMBILICAL HERNIA REPAIR  01/22/2013   preperitoneal open procedure due to significant adhesions/notes 01/22/2013  . VENTRAL HERNIA REPAIR N/A 01/22/2013   Procedure: ATTEMPTED LAPAROSCOPIC VENTRAL HERNIA CONVERTED TO OPEN;  Surgeon: [name], MD;  Location[facility];  Service: General;  Laterality: N/A;     Family History  Problem Relation Age of Onset  . Heart disease Mother   . Hyperlipidemia Mother   . Hypertension Mother   . Kidney disease Father   . Stroke Father   .  Kidney disease Brother   . Amblyopia Neg Hx   . Blindness Neg Hx   . Cataracts Neg Hx   . Diabetes Neg Hx   . Glaucoma Neg Hx   . Macular degeneration Neg Hx   . Retinal detachment Neg Hx   . Strabismus Neg Hx   . Retinitis pigmentosa Neg Hx      Social History   Socioeconomic History  . Marital status: Married    Spouse name: [name]  . Number of children: 3  . Years of education: 43  . Highest education level: Not on file  Occupational History    Comment: disabled  Tobacco Use  . Smoking status: Never Smoker  . Smokeless tobacco: Never Used  Vaping Use  . Vaping Use: Never used  Substance and Sexual Activity  . Alcohol use: No    Alcohol/week: 0.0 standard drinks  . Drug use: No  . Sexual activity: Yes  Other Topics Concern  . Not on file  Social History Narrative   Patient is married [place]) and lives at home with his wife and children.   Patient has three children.   Patient is in disability.   Patient has a high school education.   Patient is right-handed   Patient drinks very little soda.            Social Determinants of Health   Financial Resource Strain:   . Difficulty of Paying  Living Expenses: Not on file  Food Insecurity:   . Worried About Charity fundraiser in the Last Year: Not on file  . Ran Out of Food in the Last Year: Not on file  Transportation Needs:   . Lack of Transportation (Medical): Not on file  . Lack of Transportation (Non-Medical): Not on file  Physical Activity:   . Days of Exercise per Week: Not on file  . Minutes of Exercise per Session: Not on file  Stress:   . Feeling of Stress : Not on file  Social Connections:   . Frequency of Communication with Friends and Family: Not on file  . Frequency of Social Gatherings with Friends and Family: Not on file  . Attends Religious Services: Not on file  . Active Member of Clubs or Organizations: Not on file  . Attends Archivist Meetings: Not on file  . Marital Status: Not on file  Intimate Partner Violence:   . Fear of Current or Ex-Partner: Not on file  . Emotionally Abused: Not on file  . Physically Abused: Not on file  . Sexually Abused: Not on file     BP 126/70   Pulse 84   Ht 5\' 6"  (1.676 m)   Wt 187 lb (84.8 kg)   SpO2 94%   BMI 30.18 kg/m   Physical Exam:  Well appearing NAD HEENT: Unremarkable Neck:  No JVD, no thyromegally; indwelling HD catheter Lymphatics:  No adenopathy Back:  No CVA tenderness Lungs:  Clear with no wheezes HEART:  Regular rate rhythm, no murmurs, no rubs, no clicks Abd:  soft, positive bowel sounds, no organomegally, no rebound, no guarding Ext:  2 plus pulses, no edema, no cyanosis, no clubbing Skin:  No rashes no nodules Neuro:  CN II through XII intact, motor grossly intact   Assess/Plan: 1. Chronic systolic heart failure - with HD, and medical therapy, his symptoms are well compensated. His severe LV dysfunction is worrisome but he appears to be tolerating physical activity which I have encouraged him to  continue 2. VT - he is well controlled. He had been on amio in the past. He is not a candidate for ICD insertion with his  indwelling HD catheter 3. HTN - his bp is well controlled. He will maintain a low sodium diet.  Carleene Overlie Matilde Markie,MD

## 2020-03-18 DIAGNOSIS — N2581 Secondary hyperparathyroidism of renal origin: Secondary | ICD-10-CM | POA: Diagnosis not present

## 2020-03-18 DIAGNOSIS — N186 End stage renal disease: Secondary | ICD-10-CM | POA: Diagnosis not present

## 2020-03-18 DIAGNOSIS — Z992 Dependence on renal dialysis: Secondary | ICD-10-CM | POA: Diagnosis not present

## 2020-03-20 ENCOUNTER — Encounter: Payer: Self-pay | Admitting: Medical Oncology

## 2020-03-20 DIAGNOSIS — N2581 Secondary hyperparathyroidism of renal origin: Secondary | ICD-10-CM | POA: Diagnosis not present

## 2020-03-20 DIAGNOSIS — Z992 Dependence on renal dialysis: Secondary | ICD-10-CM | POA: Diagnosis not present

## 2020-03-20 DIAGNOSIS — N186 End stage renal disease: Secondary | ICD-10-CM | POA: Diagnosis not present

## 2020-03-22 DIAGNOSIS — N2581 Secondary hyperparathyroidism of renal origin: Secondary | ICD-10-CM | POA: Diagnosis not present

## 2020-03-22 DIAGNOSIS — N186 End stage renal disease: Secondary | ICD-10-CM | POA: Diagnosis not present

## 2020-03-22 DIAGNOSIS — Z992 Dependence on renal dialysis: Secondary | ICD-10-CM | POA: Diagnosis not present

## 2020-03-23 ENCOUNTER — Encounter: Payer: Self-pay | Admitting: Radiation Oncology

## 2020-03-23 ENCOUNTER — Encounter: Payer: Self-pay | Admitting: Medical Oncology

## 2020-03-23 NOTE — Progress Notes (Signed)
GU Location of Tumor / Histology: prostatic adenocarcinoma  If Prostate Cancer, Gleason Score is (4 + 3) and PSA is (7.87). Prostate volume: 42.9  Michael [name]. presented as a referral from his PCP to Dr. Lovena Neighbours for further evaluation of an elevated PSA. Patient with history of polycystic kidneys causing ESRD requiring dialysis Monday, Wednesday and Friday.  Biopsies of prostate (if applicable) revealed:   Past/Anticipated interventions by urology, if any: prostate biopsy, referral to [facility]  Past/Anticipated interventions by medical oncology, if any: no  Weight changes, if any: denies  Bowel/Bladder complaints, if any: Long history of ED. Voids approximately 10-20 cc per day.  Denies dysuria, hematuria, urinary leakage or incontinence.Denies any bowel complaints.  Nausea/Vomiting, if any: denies  Pain issues, if any:  denies  SAFETY ISSUES:  Prior radiation? denies  Pacemaker/ICD? denies  Possible current pregnancy? no, male patient  Is the patient on methotrexate? denies  Current Complaints / other details:  64 year old male. Married with 3 sons. Disabled. Resides in [place]. Denies a family hx of prostate ca.

## 2020-03-23 NOTE — Progress Notes (Signed)
I called pt to introduce myself as the Prostate Nurse Navigator and the Coordinator of the Prostate [place].  1. I confirmed with the patient he is aware of his referral to the clinic 11/23, arriving at 12:30 pm.  2. I discussed the format of the clinic and the physicians he will be seeing that day.  3. I discussed where the clinic is located and how to contact me.  4. I informed him I will have some medical forms for him to complete. Due to late referral time, I was unable to mail.  He voiced understanding of the above.

## 2020-03-24 ENCOUNTER — Ambulatory Visit
Admission: RE | Admit: 2020-03-24 | Discharge: 2020-03-24 | Disposition: A | Discharge status: Home / Self Care | Payer: Medicare Other | Source: Ambulatory Visit | Attending: Radiation Oncology | Admitting: Radiation Oncology

## 2020-03-24 ENCOUNTER — Encounter: Payer: Self-pay | Admitting: Medical Oncology

## 2020-03-24 ENCOUNTER — Encounter: Payer: Self-pay | Admitting: Radiation Oncology

## 2020-03-24 ENCOUNTER — Other Ambulatory Visit: Payer: Self-pay

## 2020-03-24 ENCOUNTER — Inpatient Hospital Stay: Payer: Medicare Other | Attending: Oncology | Admitting: Oncology

## 2020-03-24 ENCOUNTER — Encounter: Payer: Self-pay | Admitting: General Practice

## 2020-03-24 VITALS — BP 121/87 | HR 92 | Temp 97.8°F | Resp 18 | Ht 66.0 in | Wt 188.4 lb

## 2020-03-24 DIAGNOSIS — I252 Old myocardial infarction: Secondary | ICD-10-CM | POA: Insufficient documentation

## 2020-03-24 DIAGNOSIS — N186 End stage renal disease: Secondary | ICD-10-CM | POA: Diagnosis not present

## 2020-03-24 DIAGNOSIS — K219 Gastro-esophageal reflux disease without esophagitis: Secondary | ICD-10-CM | POA: Insufficient documentation

## 2020-03-24 DIAGNOSIS — Z79899 Other long term (current) drug therapy: Secondary | ICD-10-CM | POA: Insufficient documentation

## 2020-03-24 DIAGNOSIS — R34 Anuria and oliguria: Secondary | ICD-10-CM | POA: Diagnosis not present

## 2020-03-24 DIAGNOSIS — I120 Hypertensive chronic kidney disease with stage 5 chronic kidney disease or end stage renal disease: Secondary | ICD-10-CM | POA: Insufficient documentation

## 2020-03-24 DIAGNOSIS — Z992 Dependence on renal dialysis: Secondary | ICD-10-CM | POA: Insufficient documentation

## 2020-03-24 DIAGNOSIS — E785 Hyperlipidemia, unspecified: Secondary | ICD-10-CM | POA: Insufficient documentation

## 2020-03-24 DIAGNOSIS — M109 Gout, unspecified: Secondary | ICD-10-CM | POA: Diagnosis not present

## 2020-03-24 DIAGNOSIS — Z7901 Long term (current) use of anticoagulants: Secondary | ICD-10-CM | POA: Diagnosis not present

## 2020-03-24 DIAGNOSIS — I2510 Atherosclerotic heart disease of native coronary artery without angina pectoris: Secondary | ICD-10-CM | POA: Diagnosis not present

## 2020-03-24 DIAGNOSIS — C61 Malignant neoplasm of prostate: Secondary | ICD-10-CM

## 2020-03-24 DIAGNOSIS — N2581 Secondary hyperparathyroidism of renal origin: Secondary | ICD-10-CM | POA: Diagnosis not present

## 2020-03-24 DIAGNOSIS — I132 Hypertensive heart and chronic kidney disease with heart failure and with stage 5 chronic kidney disease, or end stage renal disease: Secondary | ICD-10-CM | POA: Insufficient documentation

## 2020-03-24 DIAGNOSIS — R972 Elevated prostate specific antigen [PSA]: Secondary | ICD-10-CM | POA: Diagnosis not present

## 2020-03-24 HISTORY — DX: Malignant neoplasm of prostate: C61

## 2020-03-24 NOTE — Progress Notes (Signed)
                               Care Plan Summary  Name: Michael [name] DOB: 03-Mar-1956   Your Medical Team:   Urologist -  Dr. Raynelle Bring, [facility]  Radiation Oncologist - Dr. Tyler Pita, [facility]   Medical Oncologist - Dr. Zola Button, [facility]  Recommendations: 1) Radiation     * These recommendations are based on information available as of today's consult.      Recommendations may change depending on the results of further tests or exams.  Next Steps: 1) Consider your options     When appointments need to be scheduled, you will be contacted by [facility] and/or [facility].  Questions?  Please do not hesitate to call Michael Rue, Michael [name], Michael [name], Michael [name] at [phone] any questions or concerns.  Michael [name] is your Oncology Nurse Navigator and is available to assist you while you're receiving your medical care at [facility].

## 2020-03-24 NOTE — Progress Notes (Signed)
[place] Psychosocial Distress Screening Spiritual Care  Met with Michael [name] in [facility] to introduce [facility] team/resources, reviewing distress screen per protocol.  The patient scored a 1 on the Psychosocial Distress Thermometer which indicates mild distress. Also assessed for distress and other psychosocial needs.   ONCBCN DISTRESS SCREENING 03/24/2020  Screening Type Initial Screening  Distress experienced in past week (1-10) 1  Emotional problem type Adjusting to illness  Referral to support programs Yes   [name] reports that tends to approach problems in a matter-of-fact way, keeping his distress low, and that faith and family are two key sources of meaning-making, support, and coping.   Follow up needed: No. [name] reports no other needs/concerns at this time, but is aware of ongoing Team availability should needs or questions arise.   [place], [place], [facility] Pager [phone] Voicemail [phone]

## 2020-03-24 NOTE — Consult Note (Signed)
Office Visit Report     03/24/2020   --------------------------------------------------------------------------------   Michael [name]  [name]: [id number]  DOB: Jun 16, 1955, 64 year old Male  SSN: [id number]   PRIMARY CARE:  Michael Baltimore A. Alyson Ingles, MD  REFERRING:  Michael [place]. Lovena Neighbours, MD  PROVIDER:  [place] [name], M.D.  TREATING:  Michael [name], M.D.  LOCATION:  [facility] [phone]     --------------------------------------------------------------------------------   CC/HPI: CC: Prostate Cancer   Physician requesting consult: Michael [name]  PCP: Dr. Maury [name]  Location of consult: [facility]   Michael [name] is a 64 year old gentleman with ESRD secondary to ADPCKD on hemodialysis (M-W-F) who was found to have a persistently elevated PSA of 7.87 prompting a TRUS biopsy of the prostate on 03/05/20. This demonstrated Gleason 4+3=7 adenocarcinoma with 4 out of 12 biopsy cores positive for malignancy. He is oliguric and only produces about 10-20 cc of urine per day. He is very interested in becoming a candidate for a renal transplant in does have potential living related donor options.   He does have comorbidities including history of coronary artery disease status post myocardial infarction during surgery for AV fistula placement in the recent past.   Family history: None.   Imaging studies: None.   PMH: He has a history of CAD s/p MI, ESRD due to ADPCKD, gout, hypertension and hyperlipidemia.  PSH: Appendectomy, laparoscopic hernia repair. After his laparoscopic hernia, he apparently had a bowel obstruction requiring an open exploratory laparotomy with a lower midline incision.   TNM stage: cT1c Nx Mx  PSA: 7.87  Gleason score:4+3=7  Biopsy (03/05/20): 4/12 cores positive  Left: Benign  Right: R apex (10%, 4+3=7), R lateral apex (40%, 4+3=7), R lateral mid (10%, 3+4=7), R base (20%, 3+4=7)  Prostate  volume: 42.9 cc   Nomogram  OC disease: 47%  EPE: 48%  SVI: 8%  LNI: 9%  PFS (5 year, 10 year): 65%, 49%   Urinary function: He is oliguric and only produces about 10-20 cc of urine per day.  Erectile function: He has severe erectile dysfunction. He has previously been treated with intracavernosal injection therapy but no longer is using this treatment. This has become a lower priority.     ALLERGIES: Allopurinol TABS - foot pain Colchicine TABS - Anemia, foot pain Indapamide TABS Levitra - Headache Lipitor Viagra - felt worse than taking Levitra    MEDICATIONS: Hydrocodone-Acetaminophen 5 mg-325 mg tablet 1 tablet PO As Directed Take one hour prior to your scheduled prostate biopsy  Levofloxacin 750 mg tablet 1 tablet PO Daily As Directed Start taking the day before, the day of and the day after your prostate biopsy  Metoprolol Succinate 100 mg tablet, extended release 24 hr  Triple Mix PGE1 40 Micrograms, Papaverine 30 mg, phentolamine 1 mg and lidocaine 10 mg/mL Dispense 10 cc  Aspirin Ec 81 mg tablet, delayed release  Atorvastatin Calcium 40 mg tablet  Calcium Acetate 667 mg capsule  Nitroglycerin 0.4 mg tablet, sublingual  Sensipar 30 mg tablet     GU PSH: Penile Injection - 2018, 2018 Prostate Needle Biopsy - 03/05/2020     NON-GU PSH: Appendectomy Laparoscopy, Surgical; Repair Umbilical Hernia Surgical Pathology, Gross And Microscopic Examination For Prostate Needle - 03/05/2020     GU PMH: End-Stage Renal Disease - 03/13/2020 Prostate Cancer, Grade 3, unfavorable intermediate risk prostate cancer - 03/13/2020 Elevated PSA - 03/05/2020, - 02/06/2020 ED due to arterial insufficiency (  Stable), We discussed proceeding to a higher dosage of PGE1 with the addition of papaverine and phentolamine. At this increased dose I recommend decreasing the amount injected initially to 0.5 mg with the understanding that he can increase his injections up to 1 cc and that if he develops  an erection that lasted for 4 hours or longer he needs to seek attention at our office or the emergency room. - 2018, - 2018, - 2018 Persistent proteinuria, Unspec - 2018 Renal calculus    NON-GU PMH: Encounter for general adult medical examination without abnormal findings, Encounter for preventive health examination GERD Gout Hypertension Mixed hyperlipidemia Myocardial Infarction Polycystic kidney, unspecified    FAMILY HISTORY: chronic kidney disease - Father Deceased - Father Hypertension - Father, Mother nephrolithiasis - Father renal failure - Father stroke - Father   SOCIAL HISTORY: Marital Status: Married Preferred Language: English; Ethnicity: Not Hispanic Or Latino; Race: Black or African American Current Smoking Status: Patient has never smoked.   Tobacco Use Assessment Completed: Used Tobacco in last 30 days? Has never drank.  Drinks 2 caffeinated drinks per day. Patient's occupation is/was Disabled.     Notes: 3 sons   REVIEW OF SYSTEMS:    GU Review Male:   Patient denies frequent urination, hard to postpone urination, burning/ pain with urination, get up at night to urinate, leakage of urine, stream starts and stops, trouble starting your streams, and have to strain to urinate .  Gastrointestinal (Lower):   Patient denies diarrhea and constipation.  Gastrointestinal (Upper):   Patient denies nausea and vomiting.  Constitutional:   Patient denies fever, night sweats, weight loss, and fatigue.  Skin:   Patient denies skin rash/ lesion and itching.  Eyes:   Patient denies blurred vision and double vision.  Ears/ Nose/ Throat:   Patient denies sinus problems and sore throat.  Hematologic/Lymphatic:   Patient denies swollen glands and easy bruising.  Cardiovascular:   Patient denies leg swelling and chest pains.  Respiratory:   Patient denies cough and shortness of breath.  Endocrine:   Patient denies excessive thirst.  Musculoskeletal:   Patient denies back  pain and joint pain.  Neurological:   Patient denies headaches and dizziness.  Psychologic:   Patient denies depression and anxiety.   VITAL SIGNS: None   MULTI-SYSTEM PHYSICAL EXAMINATION:    Constitutional: Well-nourished. No physical deformities. Normally developed. Good grooming.     Complexity of Data:  Lab Test Review:   PSA  Records Review:   Pathology Reports, Previous Patient Records   02/06/20 12/12/19 09/08/15 12/23/02  PSA  Total PSA 7.87 ng/mL 8.08 ng/ml 2.18 ng/dl 2.12     PROCEDURES: None   ASSESSMENT:      ICD-10 Details  1 GU:   Prostate Cancer - C61    PLAN:           Document Letter(s):  Created for Patient: Clinical Summary         Notes:   1. Unfavorable intermediate risk prostate cancer: I had a detailed discussion with Mr. Tino today regarding his prostate cancer diagnosis and options for treatment particularly in the context of his end-stage renal disease and goal of proceeding with a renal transplant at some point in the future. Initially, he was absolutely convinced that he only wanted to proceed with surgery. This was mainly due to the fact that radiation would be somewhat inconvenient for him considering his need for hemodialysis. After further discussion and understanding increased risks of surgery from  a cardiovascular standpoint especially considering his recent myocardial infarction with a minor surgical procedure along with the concern of bladder neck contracture with a dry anastomosis and the potential for further adhesions developing in the pelvis related to pelvic surgery which could potentially complicate a future renal transplant, he did come around to thinking that radiation therapy would likely be less risky. The patient was counseled about the natural history of prostate cancer and the standard treatment options that are available for prostate cancer. It was explained to him how his age and life expectancy, clinical stage, Gleason score, and  PSA affect his prognosis, the decision to proceed with additional staging studies, as well as how that information influences recommended treatment strategies. We discussed the roles for active surveillance, radiation therapy, surgical therapy, androgen deprivation, as well as ablative therapy options for the treatment of prostate cancer as appropriate to his individual cancer situation. We discussed the risks and benefits of these options with regard to their impact on cancer control and also in terms of potential adverse events, complications, and impact on quality of life particularly related to urinary and sexual function. The patient was encouraged to ask questions throughout the discussion today and all questions were answered to his stated satisfaction. In addition, the patient was provided with and/or directed to appropriate resources and literature for further education about prostate cancer and treatment options.   At the end of our appointment, he was most interested in proceeding with definitive radiation therapy. He is likely going to proceed with external beam radiation therapy but will notify Dr. Tammi Klippel of his final decision.   Cc: Dr. Harrell Gave Winter  Dr. Maury [name]  Dr. Tyler Pita  Dr. Zola Button        Next Appointment:      Next Appointment: 03/24/2020 02:00 PM    Appointment Type: Block Time    Location: [facility] [phone]    Provider: Raynelle [name], M.D.    Reason for Visit: [place]: We spent 42 minutes dedicated to evaluation and management time, including face to face interaction, discussions on coordination of care, documentation, result review, and discussion with others as applicable.

## 2020-03-24 NOTE — Progress Notes (Signed)
Reason for the request:    Prostate cancer  HPI: I was asked by Dr. Lovena Neighbours to evaluate [name] with the diagnosis of prostate cancer.  He is a 64 year old man with history of end-stage renal disease currently on hemodialysis who was found to have an elevated PSA.  In October 2021 his PSA was 7.7 and underwent a prostate biopsy by Dr. Lovena Neighbours completed in March 05, 2020.  The biopsy showed a Gleason score 4+3 = 7 2 cores and a Gleason score 3+4 = 7 and 2 additional cores.  Clinically, he reports no major complaints at this time.  He has makes very little urine and does not report any major urinary symptoms including hematuria or dysuria.  His performance status and quality of life remains reasonable.  He is status post hospitalization back in February of 2021 after he developed cardiac arrest and anoxic brain injury that required cardiac resuscitation and was able to fully recover at this time. He denies any residual complications related to that event.    He does not report any headaches, blurry vision, syncope or seizures. Does not report any fevers, chills or sweats.  Does not report any cough, wheezing or hemoptysis.  Does not report any chest pain, palpitation, orthopnea or leg edema.  Does not report any nausea, vomiting or abdominal pain.  Does not report any constipation or diarrhea.  Does not report any skeletal complaints.    Does not report frequency, urgency or hematuria.  Does not report any skin rashes or lesions. Does not report any heat or cold intolerance.  Does not report any lymphadenopathy or petechiae.  Does not report any anxiety or depression.  Remaining review of systems is negative.    Past Medical History:  Diagnosis Date  . Atrial arrhythmia    atrial tachycardia with variable AV conduction versus atypical aflutter 01/10/19, rate control (02/06/19)  . Cataract   . Dyspnea    on exertion  . ESRD (end stage renal disease) ([place])    Hemo- MWF, Polycystic kidney disease  .  Fatigue   . History of kidney stones    removal of stone- cysto  . Hyperlipidemia   . Hyperparathyroidism, secondary renal ([place])   . Hypertension   . Hypoxemia 12/12/2013  . Nonischemic cardiomyopathy ([place])    Er 25% 2015, 55 % 2013  . OSA on CPAP    no longer using cpap  . OSA on CPAP 03/24/2014  . Pneumonia    2015ish  . Prostate cancer ([place])   . Ventricular tachycardia//Freq PVCs   . Wears glasses   :  Past Surgical History:  Procedure Laterality Date  . A-FLUTTER ABLATION N/A 04/11/2019   Procedure: A-FLUTTER ABLATION;  Surgeon: Evans Lance, MD;  Location: [place] [facility];  Service: Cardiovascular;  Laterality: N/A;  . A/V FISTULAGRAM Left 04/27/2017   Procedure: A/V FISTULAGRAM;  Surgeon: Conrad Warren, MD;  Location: [facility];  Service: Cardiovascular;  Laterality: Left;  lt arm  . A/V FISTULAGRAM Left 01/10/2019   Procedure: A/V FISTULAGRAM;  Surgeon: Marty Heck, MD;  Location: [facility];  Service: Cardiovascular;  Laterality: Left;  . APPENDECTOMY    . AV FISTULA PLACEMENT  12/05/2011   Procedure: ARTERIOVENOUS (AV) FISTULA CREATION;LLEFT ARM  Surgeon: Conrad [place]ph[place] Rock, MD;  Location: [facility];  Service: Vascular;  Laterality: Left;  RADIO-CEPHALIC  fistula left arm  . AV FISTULA PLACEMENT  01/11/2012   Procedure: ARTERIOVENOUS (AV) FISTULA CREATION;  Surgeon: Conrad Bellefontaine Neighbors, MD;  Location: [facility] OR;  Service: Vascular;  Laterality: Left;  Creation of left brachial cephalic arteriovenous fistula  . AV FISTULA PLACEMENT Right 03/07/2019   Procedure: ARTERIOVENOUS (AV) FISTULA CREATION  RIGHT ARM;  Surgeon: Marty Heck, MD;  Location: [facility];  Service: Vascular;  Laterality: Right;  . BASCILIC VEIN TRANSPOSITION Left 12/27/2016   Procedure: BASILIC VEIN TRANSPOSITION LEFT UPPER ARM FIRST STAGE;  Surgeon: Conrad [place] Park, MD;  Location: [facility];  Service: Vascular;  Laterality: Left;  . BASCILIC VEIN TRANSPOSITION Left 01/31/2017   Procedure: LEFT ARM  BASILIC VEIN TRANSPOSITION, SECOND STAGE;  Surgeon: Conrad Boonville, MD;  Location: [facility];  Service: Vascular;  Laterality: Left;  . CARDIAC CATHETERIZATION  04-05-2010   checking for blockage but none-[facility]  . COLONOSCOPY    . CYSTOSCOPY W/ STONE MANIPULATION     "laser once" (01/22/2013)  . HEMATOMA EVACUATION Left 05/09/2017   Procedure: EVACUATION HEMATOMA LEFT ARM;  Surgeon: Conrad Niagara, MD;  Location: [facility];  Service: Vascular;  Laterality: Left;  . HERNIA REPAIR    . INGUINAL HERNIA REPAIR Right 11/06/2015   Procedure: OPEN HERNIA REPAIR  RIGHT INGUINAL ADULT;  Surgeon: [name], MD;  Location: [facility];  Service: General;  Laterality: Right;  with MESH  . INSERTION OF DIALYSIS CATHETER Right 10/05/2016   Procedure: INSERTION OF right internal jugular DIALYSIS CATHETER;  Surgeon: [name], MD;  Location: [facility];  Service: Vascular;  Laterality: Right;  . INSERTION OF MESH  03/20/2012   Procedure: INSERTION OF MESH;  UMB Surgeon: [name], MD;  Location: [facility];  Service: General;  Laterality: N/A;  . INSERTION OF MESH N/A 01/22/2013   Procedure: INSERTION OF MESH;  Surgeon: [name], MD;  Location: [facility];  Service: General;  Laterality: N/A;  . LAPAROTOMY  04/02/2012   Procedure: EXPLORATORY LAPAROTOMY;  Surgeon: [name], MD;  Location: [facility];  Service: General;  Laterality: N/A;  Exploratory Laparotomy with resection of small intestine  . LEFT HEART CATHETERIZATION WITH CORONARY ANGIOGRAM N/A 05/14/2013   Procedure: LEFT HEART CATHETERIZATION WITH CORONARY ANGIOGRAM;  Surgeon: Sinclair Grooms, MD;  Location: [facility] CATH LAB;  Service: Cardiovascular;  Laterality: N/A;  . LIGATION OF ARTERIOVENOUS  FISTULA Left 12/27/2016   Procedure: LIGATION/EXCISION OF LEFT UPPER ARM ARTERIOVENOUS  FISTULA;  EVACUATION OF HEMATOMA;  Surgeon: Conrad Holdingford, MD;  Location: [place];  Service: Vascular;  Laterality: Left;  . LIGATION OF ARTERIOVENOUS  FISTULA Left 03/07/2019    Procedure: LIGATION OF ARTERIOVENOUS FISTULA  LEFT UPPER ARM;  Surgeon: Marty Heck, MD;  Location: [place];  Service: Vascular;  Laterality: Left;  . REVISON OF ARTERIOVENOUS FISTULA Left 10/05/2016   Procedure: REVISON OF left arm ARTERIOVENOUS FISTULA;  Surgeon: [name], MD;  Location: [facility];  Service: Vascular;  Laterality: Left;  . TONSILLECTOMY    . UMBILICAL HERNIA REPAIR  03/20/2012   Procedure: HERNIA REPAIR UMBILICAL ADULT;  Surgeon: [name], MD;  Location: [place];  Service: General;  Laterality: N/A;  . UMBILICAL HERNIA REPAIR  01/22/2013   preperitoneal open procedure due to significant adhesions/notes 01/22/2013  . VENTRAL HERNIA REPAIR N/A 01/22/2013   Procedure: ATTEMPTED LAPAROSCOPIC VENTRAL HERNIA CONVERTED TO OPEN;  Surgeon: [name], MD;  Location: [place];  Service: General;  Laterality: N/A;  :   Current Outpatient Medications:  .  acyclovir (ZOVIRAX) 400 MG tablet, Take 1 tablet (400 mg total)  by mouth daily with lunch. (Patient not taking: Reported on 03/24/2020), Disp: 30 tablet, Rfl: 1 .  apixaban (ELIQUIS) 5 MG TABS tablet, Take 1 tablet (5 mg total) by mouth 2 (two) times daily., Disp: 180 tablet, Rfl: 3 .  aspirin EC 81 MG EC tablet, Take 1 tablet (81 mg total) by mouth daily., Disp: , Rfl:  .  atorvastatin (LIPITOR) 40 MG tablet, Take 40 mg by mouth at bedtime. , Disp: , Rfl:  .  nitroGLYCERIN (NITROSTAT) 0.4 MG SL tablet, Place 1 tablet (0.4 mg total) under the tongue every 5 (five) minutes x 3 doses as needed for chest pain. (Patient not taking: Reported on 03/24/2020), Disp: 30 tablet, Rfl: 12 .  prednisoLONE acetate (PRED FORTE) 1 % ophthalmic suspension, Place 1 drop into the right eye daily at 12 noon. , Disp: , Rfl:  .  sevelamer carbonate (RENVELA) 800 MG tablet, Take 2 tablets (1,600 mg total) by mouth 3 (three) times daily with meals., Disp: 180 tablet, Rfl: 0:  No Known Allergies:  Family History  Problem Relation Age of Onset   . Heart disease Mother   . Hyperlipidemia Mother   . Hypertension Mother   . Kidney disease Father   . Stroke Father   . Kidney disease Brother   . Amblyopia Neg Hx   . Blindness Neg Hx   . Cataracts Neg Hx   . Diabetes Neg Hx   . Glaucoma Neg Hx   . Macular degeneration Neg Hx   . Retinal detachment Neg Hx   . Strabismus Neg Hx   . Retinitis pigmentosa Neg Hx   . Breast cancer Neg Hx   . Prostate cancer Neg Hx   . Colon cancer Neg Hx   . Pancreatic cancer Neg Hx   :  Social History   Socioeconomic History  . Marital status: Married    Spouse name: [name]  . Number of children: 3  . Years of education: 37  . Highest education level: Not on file  Occupational History    Comment: disabled  Tobacco Use  . Smoking status: Never Smoker  . Smokeless tobacco: Never Used  Vaping Use  . Vaping Use: Never used  Substance and Sexual Activity  . Alcohol use: No    Alcohol/week: 0.0 standard drinks  . Drug use: No  . Sexual activity: Yes  Other Topics Concern  . Not on file  Social History Narrative   Patient is married [place]) and lives at home with his wife and children.   Patient has three children.   Patient is in disability.   Patient has a high school education.   Patient is right-handed   Patient drinks very little soda.            Social Determinants of Health   Financial Resource Strain:   . Difficulty of Paying Living Expenses: Not on file  Food Insecurity:   . Worried About Charity fundraiser in the Last Year: Not on file  . Ran Out of Food in the Last Year: Not on file  Transportation Needs:   . Lack of Transportation (Medical): Not on file  . Lack of Transportation (Non-Medical): Not on file  Physical Activity:   . Days of Exercise per Week: Not on file  . Minutes of Exercise per Session: Not on file  Stress:   . Feeling of Stress : Not on file  Social Connections:   . Frequency of Communication with Friends and Family: Not  on file  . Frequency  of Social Gatherings with Friends and Family: Not on file  . Attends Religious Services: Not on file  . Active Member of Clubs or Organizations: Not on file  . Attends Archivist Meetings: Not on file  . Marital Status: Not on file  Intimate Partner Violence:   . Fear of Current or Ex-Partner: Not on file  . Emotionally Abused: Not on file  . Physically Abused: Not on file  . Sexually Abused: Not on file  :  Pertinent items are noted in HPI.  Exam:  General appearance: alert and cooperative appeared without distress. Head: atraumatic without any abnormalities. Eyes: conjunctivae/corneas clear. PERRL.  Sclera anicteric. Throat: lips, mucosa, and tongue normal; without oral thrush or ulcers. Resp: clear to auscultation bilaterally without rhonchi, wheezes or dullness to percussion. Cardio: regular rate and rhythm, S1, S2 normal, no murmur, click, rub or gallop GI: soft, non-tender; bowel sounds normal; no masses,  no organomegaly Skin: Skin color, texture, turgor normal. No rashes or lesions Lymph nodes: Cervical, supraclavicular, and axillary nodes normal. Neurologic: Grossly normal without any motor, sensory or deep tendon reflexes. Musculoskeletal: No joint deformity or effusion.     Assessment and Plan:    64 year old man with:  1. Prostate cancer diagnosed in November 2021.  His PSA is 7.87 with a Gleason score 4+3 = 7 at least in 2 cores and 3+4 = 7 2 additional cores.  His case was discussed today in the prostate cancer multidisciplinary clinic including review of his pathology results with the reviewing pathologist.  Treatment options were also discussed today which include primary surgical therapy versus definitive therapy with radiation.  The rationale and risks and benefits of all these approaches were discussed at this time.  The role for systemic therapy was discussed today and has no role unless he developed advanced disease in the future.  He is favoring  a primary surgical therapy at this time and will have discussion with Dr. Alinda Money regarding this issue.    2.  End-stage renal disease: He is currently hemodialysis dependent and considering a renal transplant.  He is not an immediate candidate for it because of cardiomyopathy and will likely be considered in the future.   45  minutes were dedicated to this visit. The time was spent on reviewing laboratory data, imaging studies, discussing treatment options, and answering questions regarding future plan.     A copy of this consult has been forwarded to the requesting physician.

## 2020-03-24 NOTE — Progress Notes (Signed)
Radiation Oncology         [phone] ________________________________  Multidisciplinary Prostate Cancer Clinic  Initial Radiation Oncology Consultation  Name: Michael [name]. [name]: [id number]  Date: 03/24/2020  DOB: 08-12-1955  CN:OBSJG, Herbie [place], MD  Davis Gourd*   REFERRING PHYSICIAN: Davis Gourd*  DIAGNOSIS: 64 y.o. gentleman with stage T1c adenocarcinoma of the prostate with a Gleason's score of 4+3 and a PSA of 7.87    ICD-10-CM   1. Malignant neoplasm of prostate ([place])  C61     HISTORY OF PRESENT ILLNESS::Michael [name]. is a 64 y.o. gentleman with a history of polycystic kidney disease resulting in ESRD,requiring dialysis M/W/F.  He was noted to have an elevated PSA of 8.08 at the time of his routine physical by his primary care physician, Dr. Alyson Ingles.  Accordingly, he was referred for evaluation in urology by Dr. Lovena Neighbours on 02/06/2020,  digital rectal examination was performed at that time revealing no nodules. A repeat PSA performed that day remained persistently elevated at 7.87.  Therefore, the patient proceeded to transrectal ultrasound with 12 biopsies of the prostate on 03/05/2020.  The prostate volume measured 42.9 cc.  Out of 12 core biopsies, 4 were positive, all on the right.  The maximum Gleason score was 4+3, and this was seen in the right apex lateral and right apex. Additionally, Gleason 3+4 was seen in the right mid lateral and right base.    The patient reviewed the biopsy results with his urologist and he has kindly been referred today to the multidisciplinary prostate cancer clinic for presentation of pathology and radiology studies in our conference for discussion of potential radiation treatment options and clinical evaluation.  PREVIOUS RADIATION THERAPY: No  PAST MEDICAL HISTORY:  has a past medical history of Atrial arrhythmia, Cataract, Dyspnea, ESRD (end stage renal disease) ([place]), Fatigue, History of kidney stones,  Hyperlipidemia, Hyperparathyroidism, secondary renal ([place]), Hypertension, Hypoxemia (12/12/2013), Nonischemic cardiomyopathy ([place]), OSA on CPAP, OSA on CPAP (03/24/2014), Pneumonia, Prostate cancer ([place]), Ventricular tachycardia//Freq PVCs, and Wears glasses.    PAST SURGICAL HISTORY: Past Surgical History:  Procedure Laterality Date  . A-FLUTTER ABLATION N/A 04/11/2019   Procedure: A-FLUTTER ABLATION;  Surgeon: Evans Lance, MD;  Location: [place] [facility];  Service: Cardiovascular;  Laterality: N/A;  . A/V FISTULAGRAM Left 04/27/2017   Procedure: A/V FISTULAGRAM;  Surgeon: Conrad Hulbert, MD;  Location: [place] [facility];  Service: Cardiovascular;  Laterality: Left;  lt arm  . A/V FISTULAGRAM Left 01/10/2019   Procedure: A/V FISTULAGRAM;  Surgeon: Marty Heck, MD;  Location: [place] [facility];  Service: Cardiovascular;  Laterality: Left;  . APPENDECTOMY    . AV FISTULA PLACEMENT  12/05/2011   Procedure: ARTERIOVENOUS (AV) FISTULA CREATION;LLEFT ARM  Surgeon: Conrad Derby, MD;  Location: [place];  Service: Vascular;  Laterality: Left;  RADIO-CEPHALIC  fistula left arm  . AV FISTULA PLACEMENT  01/11/2012   Procedure: ARTERIOVENOUS (AV) FISTULA CREATION;  Surgeon: Conrad Plantation, MD;  Location: [place];  Service: Vascular;  Laterality: Left;  Creation of left brachial cephalic arteriovenous fistula  . AV FISTULA PLACEMENT Right 03/07/2019   Procedure: ARTERIOVENOUS (AV) FISTULA CREATION  RIGHT ARM;  Surgeon: Marty Heck, MD;  Location: [place] [place];  Service: Vascular;  Laterality: Right;  . BASCILIC VEIN TRANSPOSITION Left 12/27/2016   Procedure: BASILIC VEIN TRANSPOSITION LEFT UPPER ARM FIRST STAGE;  Surgeon: Conrad Missaukee, MD;  Location: [place];  Service: Vascular;  Laterality: Left;  .  BASCILIC VEIN TRANSPOSITION Left 01/31/2017   Procedure: LEFT ARM BASILIC VEIN TRANSPOSITION, SECOND STAGE;  Surgeon: Conrad Richfield, MD;  Location: [place];  Service: Vascular;  Laterality: Left;  . CARDIAC  CATHETERIZATION  04-05-2010   checking for blockage but none-[facility]  . COLONOSCOPY    . CYSTOSCOPY W/ STONE MANIPULATION     "laser once" (01/22/2013)  . HEMATOMA EVACUATION Left 05/09/2017   Procedure: EVACUATION HEMATOMA LEFT ARM;  Surgeon: Conrad Fairfield, MD;  Location: [place] [facility];  Service: Vascular;  Laterality: Left;  . HERNIA REPAIR    . INGUINAL HERNIA REPAIR Right 11/06/2015   Procedure: OPEN HERNIA REPAIR  RIGHT INGUINAL ADULT;  Surgeon: [name], MD;  Location: [facility];  Service: General;  Laterality: Right;  with MESH  . INSERTION OF DIALYSIS CATHETER Right 10/05/2016   Procedure: INSERTION OF right internal jugular DIALYSIS CATHETER;  Surgeon: [name], MD;  Location: [place];  Service: Vascular;  Laterality: Right;  . INSERTION OF MESH  03/20/2012   Procedure: INSERTION OF MESH;  UMB Surgeon: [name], MD;  Location: [place];  Service: General;  Laterality: N/A;  . INSERTION OF MESH N/A 01/22/2013   Procedure: INSERTION OF MESH;  Surgeon: [name], MD;  Location: [place];  Service: General;  Laterality: N/A;  . LAPAROTOMY  04/02/2012   Procedure: EXPLORATORY LAPAROTOMY;  Surgeon: [name], MD;  Location: [place];  Service: General;  Laterality: N/A;  Exploratory Laparotomy with resection of small intestine  . LEFT HEART CATHETERIZATION WITH CORONARY ANGIOGRAM N/A 05/14/2013   Procedure: LEFT HEART CATHETERIZATION WITH CORONARY ANGIOGRAM;  Surgeon: Sinclair Grooms, MD;  Location: [facility];  Service: Cardiovascular;  Laterality: N/A;  . LIGATION OF ARTERIOVENOUS  FISTULA Left 12/27/2016   Procedure: LIGATION/EXCISION OF LEFT UPPER ARM ARTERIOVENOUS  FISTULA;  EVACUATION OF HEMATOMA;  Surgeon: Conrad Tyonek, MD;  Location: [place];  Service: Vascular;  Laterality: Left;  . LIGATION OF ARTERIOVENOUS  FISTULA Left 03/07/2019   Procedure: LIGATION OF ARTERIOVENOUS FISTULA  LEFT UPPER ARM;  Surgeon: Marty Heck, MD;  Location: [place];  Service: Vascular;  Laterality:  Left;  . REVISON OF ARTERIOVENOUS FISTULA Left 10/05/2016   Procedure: REVISON OF left arm ARTERIOVENOUS FISTULA;  Surgeon: [name], MD;  Location: [place];  Service: Vascular;  Laterality: Left;  . TONSILLECTOMY    . UMBILICAL HERNIA REPAIR  03/20/2012   Procedure: HERNIA REPAIR UMBILICAL ADULT;  Surgeon: [name], MD;  Location: [place];  Service: General;  Laterality: N/A;  . UMBILICAL HERNIA REPAIR  01/22/2013   preperitoneal open procedure due to significant adhesions/notes 01/22/2013  . VENTRAL HERNIA REPAIR N/A 01/22/2013   Procedure: ATTEMPTED LAPAROSCOPIC VENTRAL HERNIA CONVERTED TO OPEN;  Surgeon: [name], MD;  Location: [facility];  Service: General;  Laterality: N/A;    FAMILY HISTORY: family history includes Heart disease in his mother; Hyperlipidemia in his mother; Hypertension in his mother; Kidney disease in his brother and father; Stroke in his father.  SOCIAL HISTORY:  reports that he has never smoked. He has never used smokeless tobacco. He reports that he does not drink alcohol and does not use drugs.  ALLERGIES: Patient has no known allergies.  MEDICATIONS:  Current Outpatient Medications  Medication Sig Dispense Refill  . apixaban (ELIQUIS) 5 MG TABS tablet Take 1 tablet (5 mg total) by mouth 2 (two) times daily. 180 tablet 3  . aspirin EC 81 MG EC tablet Take 1 tablet (  81 mg total) by mouth daily.    [name] atorvastatin (LIPITOR) 40 MG tablet Take 40 mg by mouth at bedtime.     . prednisoLONE acetate (PRED FORTE) 1 % ophthalmic suspension Place 1 drop into the right eye daily at 12 noon.     . sevelamer carbonate (RENVELA) 800 MG tablet Take 2 tablets (1,600 mg total) by mouth 3 (three) times daily with meals. 180 tablet 0  . acyclovir (ZOVIRAX) 400 MG tablet Take 1 tablet (400 mg total) by mouth daily with lunch. (Patient not taking: Reported on 03/24/2020) 30 tablet 1  . nitroGLYCERIN (NITROSTAT) 0.4 MG SL tablet Place 1 tablet (0.4 mg total) under the tongue  every 5 (five) minutes x 3 doses as needed for chest pain. (Patient not taking: Reported on 03/24/2020) 30 tablet 12   No current facility-administered medications for this encounter.    REVIEW OF SYSTEMS:  On review of systems, the patient reports that he is doing well overall. He denies any chest pain, shortness of breath, cough, fevers, chills, night sweats, unintended weight changes. He denies any bowel disturbances, and denies abdominal pain, nausea or vomiting. He denies any new musculoskeletal or joint aches or pains. He is on dialysis M/W/F and voids approximately 10-20 cc per day. He denies dysuria, hematuria, urinary leakage, or incontinence. He reports a long history of erectile dysfunction. A complete review of systems is obtained and is otherwise negative.   PHYSICAL EXAM:  Wt Readings from Last 3 Encounters:  03/24/20 188 lb 6.4 oz (85.5 kg)  03/17/20 187 lb (84.8 kg)  02/20/20 181 lb (82.1 kg)   Temp Readings from Last 3 Encounters:  03/24/20 97.8 F (36.6 C)  06/11/19 98 F (36.7 C) (Oral)  06/04/19 99.3 F (37.4 C) (Oral)   BP Readings from Last 3 Encounters:  03/24/20 121/87  03/17/20 126/70  02/20/20 104/64   Pulse Readings from Last 3 Encounters:  03/24/20 92  03/17/20 84  02/20/20 71   Pain Assessment Pain Score: 0-No pain/10  In general this is a well appearing African-American male in no acute distress. He is alert and oriented x4 and appropriate throughout the examination. HEENT reveals that the patient is normocephalic, atraumatic. EOMs are intact. PERRLA. Skin is intact without any evidence of gross lesions. Cardiopulmonary assessment is negative for acute distress and he exhibits normal effort. The abdomen is soft, non tender, non distended. Lower extremities are negative for pretibial pitting edema, deep calf tenderness, cyanosis or clubbing.  KPS = 90  100 - Normal; no complaints; no evidence of disease. 90   - Able to carry on normal activity;  minor signs or symptoms of disease. 80   - Normal activity with effort; some signs or symptoms of disease. 13   - Cares for self; unable to carry on normal activity or to do active work. 60   - Requires occasional assistance, but is able to care for most of his personal needs. 50   - Requires considerable assistance and frequent medical care. 67   - Disabled; requires special care and assistance. 2   - Severely disabled; hospital admission is indicated although death not imminent. 52   - Very sick; hospital admission necessary; active supportive treatment necessary. 10   - Moribund; fatal processes progressing rapidly. 0     - Dead  Karnofsky DA, Abelmann WH, Craver LS and Burchenal [facility] [phone]) The use of the nitrogen mustards in the palliative treatment of carcinoma: with particular reference to bronchogenic  carcinoma Cancer 1 634-56   LABORATORY DATA:  Lab Results  Component Value Date   WBC 3.5 (L) 06/10/2019   HGB 13.0 06/10/2019   HCT 42.9 06/10/2019   MCV 84.8 06/10/2019   PLT 145 (L) 06/10/2019   Lab Results  Component Value Date   NA 138 02/20/2020   K 4.3 02/20/2020   CL 92 (L) 02/20/2020   CO2 29 02/20/2020   Lab Results  Component Value Date   ALT 6 06/01/2019   AST 38 06/01/2019   ALKPHOS 50 06/01/2019   BILITOT 1.5 (H) 06/01/2019     RADIOGRAPHY: ECHOCARDIOGRAM COMPLETE  Result Date: 03/10/2020    ECHOCARDIOGRAM REPORT   Patient Name:   Michael [name]. Date of Exam: 03/10/2020 Medical Rec #:  [id number]          Height:       66.0 in Accession #:    [id number]         Weight:       181.0 lb Date of Birth:  30-May-1955          BSA:          1.917 m Patient Age:    54 years           BP:           104/64 mmHg Patient Gender: M                  HR:           70 bpm. Exam Location:  [place] Procedure: 2D Echo, Cardiac Doppler, Color Doppler and Intracardiac            Opacification Agent Indications:    I42.9 Cardiomyopathy  History:        Patient has prior  history of Echocardiogram examinations, most                 recent 06/04/2019. Risk Factors:Dyslipidemia and Hypertension. End                 stage renal disease. Obstructive sleep apnea-CPAP. Nonishemic                 cardiomyopathy. Pneumonia. Ventricular Tachycardia. PVC's.  Sonographer:    Cresenciano Lick RDCS Referring Phys: [id number] JILL D MCDANIEL IMPRESSIONS  1. There has been no change since the prior study on 06/04/2019, there is severe biventricular dilatation and systolic dysfunction. No thrombus is seen in the left ventricle of Definity echocontrast images.  2. Left ventricular ejection fraction, by estimation, is <20%. The left ventricle has severely decreased function. The left ventricle demonstrates global hypokinesis. The left ventricular internal cavity size was severely dilated. Left ventricular diastolic function could not be evaluated. There is the interventricular septum is flattened in systole and diastole, consistent with right ventricular pressure and volume overload.  3. Right ventricular systolic function is severely reduced. The right ventricular size is severely enlarged.  4. Left atrial size was severely dilated.  5. Right atrial size was severely dilated.  6. The pericardial effusion is posterior to the left ventricle. There is no evidence of cardiac tamponade. Large pleural effusion in the left lateral region.  7. The mitral valve is normal in structure. Mild mitral valve regurgitation. No evidence of mitral stenosis.  8. The aortic valve is normal in structure. Aortic valve regurgitation is mild. No aortic stenosis is present.  9. The inferior vena cava is normal in size with <50% respiratory variability, suggesting right  atrial pressure of 8 mmHg. FINDINGS  Left Ventricle: Left ventricular ejection fraction, by estimation, is <20%. The left ventricle has severely decreased function. The left ventricle demonstrates global hypokinesis. Definity contrast agent was given IV to  delineate the left ventricular endocardial borders. The left ventricular internal cavity size was severely dilated. There is no left ventricular hypertrophy. The interventricular septum is flattened in systole and diastole, consistent with right ventricular pressure and volume overload. Left ventricular diastolic function could not be evaluated due to atrial fibrillation. Left ventricular diastolic function could not be evaluated. Right Ventricle: The right ventricular size is severely enlarged. No increase in right ventricular wall thickness. Right ventricular systolic function is severely reduced. Left Atrium: Left atrial size was severely dilated. Right Atrium: Right atrial size was severely dilated. Pericardium: Trivial pericardial effusion is present. The pericardial effusion is posterior to the left ventricle. There is no evidence of cardiac tamponade. Mitral Valve: The mitral valve is normal in structure. Mild mitral valve regurgitation. No evidence of mitral valve stenosis. Tricuspid Valve: The tricuspid valve is normal in structure. Tricuspid valve regurgitation is mild . No evidence of tricuspid stenosis. Aortic Valve: The aortic valve is normal in structure. Aortic valve regurgitation is mild. Aortic regurgitation PHT measures 435 msec. No aortic stenosis is present. Pulmonic Valve: The pulmonic valve was normal in structure. Pulmonic valve regurgitation is trivial. No evidence of pulmonic stenosis. Aorta: The aortic root is normal in size and structure. Venous: The inferior vena cava is normal in size with less than 50% respiratory variability, suggesting right atrial pressure of 8 mmHg. IAS/Shunts: No atrial level shunt detected by color flow Doppler. Additional Comments: There is a large pleural effusion in the left lateral region.  LEFT VENTRICLE PLAX 2D LVIDd:         7.50 cm LVIDs:         6.80 cm LV PW:         1.00 cm LV IVS:        0.60 cm LVOT diam:     2.30 cm LV SV:         29 LV SV Index:    15 LVOT Area:     4.15 cm  RIGHT VENTRICLE            IVC RV Basal diam:  5.30 cm    IVC diam: 1.70 cm RV S prime:     7.21 cm/s TAPSE (M-mode): 1.3 cm LEFT ATRIUM              Index       RIGHT ATRIUM           Index LA diam:        5.70 cm  2.97 cm/m  RA Area:     33.90 cm LA Vol (A2C):   121.0 ml 63.13 ml/m RA Volume:   133.00 ml 69.39 ml/m LA Vol (A4C):   88.9 ml  46.38 ml/m LA Biplane Vol: 106.0 ml 55.30 ml/m  AORTIC VALVE LVOT Vmax:   42.90 cm/s LVOT Vmean:  32.750 cm/s LVOT VTI:    0.069 m AI PHT:      435 msec  AORTA Ao Root diam: 4.20 cm Ao Asc diam:  3.60 cm  SHUNTS Systemic VTI:  0.07 m Systemic Diam: 2.30 cm Ena Dawley MD Electronically signed by Ena Dawley MD Signature Date/Time: 03/10/2020/4:17:32 PM    Final       IMPRESSION/PLAN: 64 y.o. gentleman with Stage T1c adenocarcinoma of the prostate  with a Gleason score of 4+3 and a PSA of 7.87.    We discussed the patient's workup and outlined the nature of prostate cancer in this setting. The patient's T stage, Gleason's score, and PSA put him into the unfavorable intermediate risk group. Accordingly, he is eligible for a variety of potential treatment options including brachytherapy, 5.5 weeks of external radiation, or prostatectomy.  He was quite resistant to consideration of radiotherapy so we only briefly discussed the available radiation techniques, and focused on the details and logistics of delivery. We discussed and outlined the risks, benefits, short and long-term effects associated with radiotherapy and compared and contrasted these with prostatectomy.     The patient focused most of his questions and interest in robotic-assisted laparoscopic radical prostatectomy. We discussed some of the potential advantages of surgery including surgical staging, the availability of salvage radiotherapy to the prostatic fossa, and the confidence associated with immediate biochemical response.   We discussed some of the potential proven  indications for postoperative radiotherapy including positive margins, extracapsular extension, and seminal vesicle involvement. We also talked about some of the other potential findings leading to a recommendation for radiotherapy including a non-zero postoperative PSA and positive lymph nodes. He was encouraged to ask questions that were answered to his stated satisfaction.  At the end of the conversation, the patient is interested in moving forward with prostatectomy and will also meet with Dr. Alinda Money to discuss this further.  He has had multiple previous abdominal surgeries and is hoping to be a renal transplant candidate in the future, which may impact his candidacy for surgical prostatectomy.  We enjoyed meeting him today and would be more than happy to continue to participate in his care if it is ultimately decided that radiotherapy would be in his best interest.  We also advised him that we would be more than happy to meet back with him to discuss the radiotherapy options further if he would like, after he has had a chance to meet with Dr. Alinda Money regarding surgical options.     Nicholos Johns, PA-C    Tyler Pita, MD  [facility] Direct Dial: [phone]  Fax: [phone] [email or web address]  Skype  [email or web address]   This document serves as a record of services personally performed by Tyler Pita, MD and Freeman Caldron, PA-C. It was created on their behalf by Wilburn Mylar, a trained medical scribe. The creation of this record is based on the scribe's personal observations and the provider's statements to them. This document has been checked and approved by the attending provider.

## 2020-03-25 DIAGNOSIS — C61 Malignant neoplasm of prostate: Secondary | ICD-10-CM | POA: Insufficient documentation

## 2020-03-27 DIAGNOSIS — Z992 Dependence on renal dialysis: Secondary | ICD-10-CM | POA: Diagnosis not present

## 2020-03-27 DIAGNOSIS — N2581 Secondary hyperparathyroidism of renal origin: Secondary | ICD-10-CM | POA: Diagnosis not present

## 2020-03-27 DIAGNOSIS — N186 End stage renal disease: Secondary | ICD-10-CM | POA: Diagnosis not present

## 2020-03-30 DIAGNOSIS — Z992 Dependence on renal dialysis: Secondary | ICD-10-CM | POA: Diagnosis not present

## 2020-03-30 DIAGNOSIS — N2581 Secondary hyperparathyroidism of renal origin: Secondary | ICD-10-CM | POA: Diagnosis not present

## 2020-03-30 DIAGNOSIS — N186 End stage renal disease: Secondary | ICD-10-CM | POA: Diagnosis not present

## 2020-04-01 DIAGNOSIS — Z992 Dependence on renal dialysis: Secondary | ICD-10-CM | POA: Diagnosis not present

## 2020-04-01 DIAGNOSIS — N2581 Secondary hyperparathyroidism of renal origin: Secondary | ICD-10-CM | POA: Diagnosis not present

## 2020-04-01 DIAGNOSIS — Q612 Polycystic kidney, adult type: Secondary | ICD-10-CM | POA: Diagnosis not present

## 2020-04-01 DIAGNOSIS — N186 End stage renal disease: Secondary | ICD-10-CM | POA: Diagnosis not present

## 2020-04-02 ENCOUNTER — Encounter: Payer: Self-pay | Admitting: Medical Oncology

## 2020-04-02 NOTE — Progress Notes (Shared)
[facility] Note  04/07/2020     CHIEF COMPLAINT Patient presents for No chief complaint on file.   HISTORY OF PRESENT ILLNESS: Michael Charland. is a 64 y.o. male who presents to the clinic today for:     Referring physician: [name], MD [place],  [place] [address]  HISTORICAL INFORMATION:   Selected notes from the [facility] Referred by Dr. [name]. Hecker for concern of HRVO OD; LEE- 11.30.18 ([name]) {BCVA OD: 20/100-1 OS: 20/20-2] Ocular Hx- cataract OU PMH- HTN, high chol, kidney disease, sleep apnea, emphysema   CURRENT MEDICATIONS: Current Outpatient Medications (Ophthalmic Drugs)  Medication Sig  . prednisoLONE acetate (PRED FORTE) 1 % ophthalmic suspension Place 1 drop into the right eye daily at 12 noon.    No current facility-administered medications for this visit. (Ophthalmic Drugs)   Current Outpatient Medications (Other)  Medication Sig  . acyclovir (ZOVIRAX) 400 MG tablet Take 1 tablet (400 mg total) by mouth daily with lunch. (Patient not taking: Reported on 03/24/2020)  . apixaban (ELIQUIS) 5 MG TABS tablet Take 1 tablet (5 mg total) by mouth 2 (two) times daily.  [name] aspirin EC 81 MG EC tablet Take 1 tablet (81 mg total) by mouth daily.  [name] atorvastatin (LIPITOR) 40 MG tablet Take 40 mg by mouth at bedtime.   . nitroGLYCERIN (NITROSTAT) 0.4 MG SL tablet Place 1 tablet (0.4 mg total) under the tongue every 5 (five) minutes x 3 doses as needed for chest pain. (Patient not taking: Reported on 03/24/2020)  . sevelamer carbonate (RENVELA) 800 MG tablet Take 2 tablets (1,600 mg total) by mouth 3 (three) times daily with meals.   No current facility-administered medications for this visit. (Other)   REVIEW OF SYSTEMS:  ALLERGIES No Known Allergies  PAST MEDICAL HISTORY Past Medical History:  Diagnosis Date  . Atrial arrhythmia    atrial tachycardia with variable AV conduction versus  atypical aflutter 01/10/19, rate control (02/06/19)  . Cataract   . Dyspnea    on exertion  . ESRD (end stage renal disease) ([place])    Hemo- MWF, Polycystic kidney disease  . Fatigue   . History of kidney stones    removal of stone- cysto  . Hyperlipidemia   . Hyperparathyroidism, secondary renal ([place])   . Hypertension   . Hypoxemia 12/12/2013  . Nonischemic cardiomyopathy ([place])    Er 25% 2015, 55 % 2013  . OSA on CPAP    no longer using cpap  . OSA on CPAP 03/24/2014  . Pneumonia    2015ish  . Prostate cancer ([place])   . Ventricular tachycardia//Freq PVCs   . Wears glasses    Past Surgical History:  Procedure Laterality Date  . A-FLUTTER ABLATION N/A 04/11/2019   Procedure: A-FLUTTER ABLATION;  Surgeon: Evans Lance, MD;  Location: [facility];  Service: Cardiovascular;  Laterality: N/A;  . A/V FISTULAGRAM Left 04/27/2017   Procedure: A/V FISTULAGRAM;  Surgeon: Conrad [place], MD;  Location: [facility];  Service: Cardiovascular;  Laterality: Left;  lt arm  . A/V FISTULAGRAM Left 01/10/2019   Procedure: A/V FISTULAGRAM;  Surgeon: Marty Heck, MD;  Location: [facility];  Service: Cardiovascular;  Laterality: Left;  . APPENDECTOMY    . AV FISTULA PLACEMENT  12/05/2011   Procedure: ARTERIOVENOUS (AV) FISTULA CREATION;LLEFT ARM  Surgeon: Conrad Elmira, MD;  Location: [facility];  Service: Vascular;  Laterality: Left;  RADIO-CEPHALIC  fistula left arm  . AV FISTULA PLACEMENT  01/11/2012   Procedure: ARTERIOVENOUS (AV) FISTULA CREATION;  Surgeon: Conrad [place], MD;  Location: [facility];  Service: Vascular;  Laterality: Left;  Creation of left brachial cephalic arteriovenous fistula  . AV FISTULA PLACEMENT Right 03/07/2019   Procedure: ARTERIOVENOUS (AV) FISTULA CREATION  RIGHT ARM;  Surgeon: Marty Heck, MD;  Location: [facility];  Service: Vascular;  Laterality: Right;  . BASCILIC VEIN TRANSPOSITION Left 12/27/2016   Procedure: BASILIC VEIN TRANSPOSITION LEFT UPPER  ARM FIRST STAGE;  Surgeon: Conrad Manasquan, MD;  Location: [facility];  Service: Vascular;  Laterality: Left;  . BASCILIC VEIN TRANSPOSITION Left 01/31/2017   Procedure: LEFT ARM BASILIC VEIN TRANSPOSITION, SECOND STAGE;  Surgeon: Conrad Dora, MD;  Location: [facility];  Service: Vascular;  Laterality: Left;  . CARDIAC CATHETERIZATION  04-05-2010   checking for blockage but none-[facility]  . COLONOSCOPY    . CYSTOSCOPY W/ STONE MANIPULATION     "laser once" (01/22/2013)  . HEMATOMA EVACUATION Left 05/09/2017   Procedure: EVACUATION HEMATOMA LEFT ARM;  Surgeon: Conrad Vining, MD;  Location: [facility];  Service: Vascular;  Laterality: Left;  . HERNIA REPAIR    . INGUINAL HERNIA REPAIR Right 11/06/2015   Procedure: OPEN HERNIA REPAIR  RIGHT INGUINAL ADULT;  Surgeon: [name], MD;  Location: [facility];  Service: General;  Laterality: Right;  with MESH  . INSERTION OF DIALYSIS CATHETER Right 10/05/2016   Procedure: INSERTION OF right internal jugular DIALYSIS CATHETER;  Surgeon: [name], MD;  Location: [facility];  Service: Vascular;  Laterality: Right;  . INSERTION OF MESH  03/20/2012   Procedure: INSERTION OF MESH;  UMB Surgeon: [name], MD;  Location: [facility];  Service: General;  Laterality: N/A;  . INSERTION OF MESH N/A 01/22/2013   Procedure: INSERTION OF MESH;  Surgeon: [name], MD;  Location: [facility];  Service: General;  Laterality: N/A;  . LAPAROTOMY  04/02/2012   Procedure: EXPLORATORY LAPAROTOMY;  Surgeon: [name], MD;  Location: [facility];  Service: General;  Laterality: N/A;  Exploratory Laparotomy with resection of small intestine  . LEFT HEART CATHETERIZATION WITH CORONARY ANGIOGRAM N/A 05/14/2013   Procedure: LEFT HEART CATHETERIZATION WITH CORONARY ANGIOGRAM;  Surgeon: Sinclair Grooms, MD;  Location[facility];  Service: Cardiovascular;  Laterality: N/A;  . LIGATION OF ARTERIOVENOUS  FISTULA Left 12/27/2016   Procedure: LIGATION/EXCISION OF LEFT UPPER ARM ARTERIOVENOUS  FISTULA;   EVACUATION OF HEMATOMA;  Surgeon: Conrad Norfork, MD;  Location: [facility];  Service: Vascular;  Laterality: Left;  . LIGATION OF ARTERIOVENOUS  FISTULA Left 03/07/2019   Procedure: LIGATION OF ARTERIOVENOUS FISTULA  LEFT UPPER ARM;  Surgeon: Marty Heck, MD;  Location: [facility];  Service: Vascular;  Laterality: Left;  . REVISON OF ARTERIOVENOUS FISTULA Left 10/05/2016   Procedure: REVISON OF left arm ARTERIOVENOUS FISTULA;  Surgeon: [name], MD;  Location: [facility];  Service: Vascular;  Laterality: Left;  . TONSILLECTOMY    . UMBILICAL HERNIA REPAIR  03/20/2012   Procedure: HERNIA REPAIR UMBILICAL ADULT;  Surgeon: [name], MD;  Location: [facility];  Service: General;  Laterality: N/A;  . UMBILICAL HERNIA REPAIR  01/22/2013   preperitoneal open procedure due to significant adhesions/notes 01/22/2013  . VENTRAL HERNIA REPAIR N/A 01/22/2013   Procedure: ATTEMPTED LAPAROSCOPIC VENTRAL HERNIA CONVERTED TO OPEN;  Surgeon: [name], MD;  Location: [facility];  Service: General;  Laterality: N/A;   FAMILY HISTORY Family History  Problem Relation Age of Onset  . Heart disease Mother   . Hyperlipidemia Mother   . Hypertension Mother   . Kidney disease Father   . Stroke Father   . Kidney disease Brother   . Amblyopia Neg Hx   . Blindness Neg Hx   . Cataracts Neg Hx   . Diabetes Neg Hx   . Glaucoma Neg Hx   . Macular degeneration Neg Hx   . Retinal detachment Neg Hx   . Strabismus Neg Hx   . Retinitis pigmentosa Neg Hx   . Breast cancer Neg Hx   . Prostate cancer Neg Hx   . Colon cancer Neg Hx   . Pancreatic cancer Neg Hx    SOCIAL HISTORY Social History   Tobacco Use  . Smoking status: Never Smoker  . Smokeless tobacco: Never Used  Vaping Use  . Vaping Use: Never used  Substance Use Topics  . Alcohol use: No    Alcohol/week: 0.0 standard drinks  . Drug use: No         OPHTHALMIC EXAM:  Not recorded    IMAGING AND PROCEDURES  Imaging and Procedures for 08/31/17            ASSESSMENT/PLAN:  No diagnosis found. 1,2. Inferior HRVO OS  - pt lost to f/u since Jan 2021 -- in Feb had a heart attack while undergoing fistula surgery  - just prior to 9.24.20 visit, found to have a clot in fistula, left arm  - s/p IVA OS #1 (09.24.20), #2 (10.22.20), #3 (11.23.20), #4 (1.14.21)  - BCVA stable at 20/20 OS  - OCT shows interval improvement in cystic changes IN macula, stable improvement in ellipsoid signal and IRHM  - exam shows improved DBH  - no intervention recommended today  - F/U 4 months -- DFE/OCT/possible injection  3,4. 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  - likely etiology of prior episodes of Anterior Uveitis OD  - FA on 3.18.19 without posterior involvement, vasculitis or leakage OD  - had recurrent AC cell and stellate KP on 3.11.2020 - restarted PF QID OD  - today KP and injection stably resolved, only rare pigment remains  - cornea OD with new scarring / haze; no dendrites  - IOP 19 OU and BCVA 20/20 OD  - cont Pred Forte to QD OD  - given history of repeat recurrences of anterior uveitis despite maintenance acyclovir, may need to be on a maintenance dose of topical steroid  - cont Cosopt BID OU  - epithelial dendrite remains resolved today (on po acyclovir; off topical trifluridine)  - today interval development of corneal haze -- will restart 400mg  po acyclovir and send back to Dr. Kathlen Mody for further eval and management  5. History of BRVO w/ CME OD  - s/p IVA OD #1 (12.21.18), #2 (01.18.19), #3 (02.14.19), #4 (04.02.19), #5 (05.02.19)  - OCT today with stable improvement in IRF despite no antiVEGF therapy since May 2019 -- resolved  - will continue to hold on injection OD  6,7. Lattice degeneration with atrophic holes OS-   - peripheral holes superiorly at 1030 without SRF or RD  - S/P laser retinopexy OS (01.04.19)  - good laser in place--stable  8. PVD / vitreous syneresis OU  - Discussed findings and  prognosis  - No RT or RD on 360 scleral depressed exam  - Reviewed s/s of RT/RD  - Strict return precautions for  any such RT/RD signs/symptoms  9. Combined forms age-related cataract OU-   - The symptoms of cataract, surgical options, and treatments and risks were discussed with patient.  - discussed diagnosis and progression  - not yet visually significant  - monitor for now  Ophthalmic Meds Ordered this visit:  No orders of the defined types were placed in this encounter.     No follow-ups on file.  There are no Patient Instructions on file for this visit.  This document serves as a record of services personally performed by Gardiner Sleeper, MD, PhD. It was created on their behalf by Estill Bakes, COT an ophthalmic technician. The creation of this record is the provider's dictation and/or activities during the visit.    Electronically signed by: Estill Bakes, COT 12.2.21 @ 12:09 PM  Abbreviations: M myopia (nearsighted); A astigmatism; H hyperopia (farsighted); P presbyopia; Mrx spectacle prescription;  CTL contact lenses; OD right eye; OS left eye; OU both eyes  XT exotropia; ET esotropia; PEK punctate epithelial keratitis; PEE punctate epithelial erosions; DES dry eye syndrome; MGD meibomian gland dysfunction; ATs artificial tears; PFAT's preservative free artificial tears; [place] nuclear sclerotic cataract; PSC posterior subcapsular cataract; ERM epi-retinal membrane; PVD posterior vitreous detachment; RD retinal detachment; DM diabetes mellitus; DR diabetic retinopathy; NPDR non-proliferative diabetic retinopathy; PDR proliferative diabetic retinopathy; CSME clinically significant macular edema; DME diabetic macular edema; dbh dot blot hemorrhages; CWS cotton wool spot; POAG primary open angle glaucoma; C/D cup-to-disc ratio; HVF humphrey visual field; GVF goldmann visual field; OCT optical coherence tomography; IOP intraocular pressure; BRVO Branch retinal vein occlusion; CRVO central  retinal vein occlusion; CRAO central retinal artery occlusion; BRAO branch retinal artery occlusion; RT retinal tear; SB scleral buckle; PPV pars plana vitrectomy; VH Vitreous hemorrhage; PRP panretinal laser photocoagulation; IVK intravitreal kenalog; VMT vitreomacular traction; MH Macular hole;  NVD neovascularization of the disc; NVE neovascularization elsewhere; AREDS age related eye disease study; ARMD age related macular degeneration; POAG primary open angle glaucoma; EBMD epithelial/anterior basement membrane dystrophy; ACIOL anterior chamber intraocular lens; IOL intraocular lens; PCIOL posterior chamber intraocular lens; Phaco/IOL phacoemulsification with intraocular lens placement; [place] photorefractive keratectomy; LASIK laser assisted in situ keratomileusis; HTN hypertension; DM diabetes mellitus; COPD chronic obstructive pulmonary disease

## 2020-04-02 NOTE — Progress Notes (Signed)
Spoke with patient to follow up [place]. He states he has decided to move forward with external beam. I asked if he would like a follow up consult with Ashlyn, PA to discuss radiation in depth, since he has opted not to do surgery. He declined and said he was good. I discussed placement of gold markers/SpaceOar gel and CT simulation. I asked him to call me if he has questions or concerns. I forwarded this information to [place], [place].

## 2020-04-03 DIAGNOSIS — Z992 Dependence on renal dialysis: Secondary | ICD-10-CM | POA: Diagnosis not present

## 2020-04-03 DIAGNOSIS — N186 End stage renal disease: Secondary | ICD-10-CM | POA: Diagnosis not present

## 2020-04-03 DIAGNOSIS — N2581 Secondary hyperparathyroidism of renal origin: Secondary | ICD-10-CM | POA: Diagnosis not present

## 2020-04-07 ENCOUNTER — Encounter (INDEPENDENT_AMBULATORY_CARE_PROVIDER_SITE_OTHER): Payer: Medicare Other | Admitting: Ophthalmology

## 2020-04-07 DIAGNOSIS — B0052 Herpesviral keratitis: Secondary | ICD-10-CM

## 2020-04-07 DIAGNOSIS — H34832 Tributary (branch) retinal vein occlusion, left eye, with macular edema: Secondary | ICD-10-CM

## 2020-04-07 DIAGNOSIS — H3581 Retinal edema: Secondary | ICD-10-CM

## 2020-04-07 DIAGNOSIS — H209 Unspecified iridocyclitis: Secondary | ICD-10-CM

## 2020-04-07 DIAGNOSIS — H34831 Tributary (branch) retinal vein occlusion, right eye, with macular edema: Secondary | ICD-10-CM

## 2020-04-07 DIAGNOSIS — N186 End stage renal disease: Secondary | ICD-10-CM | POA: Diagnosis not present

## 2020-04-07 DIAGNOSIS — N2581 Secondary hyperparathyroidism of renal origin: Secondary | ICD-10-CM | POA: Diagnosis not present

## 2020-04-07 DIAGNOSIS — H33332 Multiple defects of retina without detachment, left eye: Secondary | ICD-10-CM

## 2020-04-07 DIAGNOSIS — H35412 Lattice degeneration of retina, left eye: Secondary | ICD-10-CM

## 2020-04-07 DIAGNOSIS — H25813 Combined forms of age-related cataract, bilateral: Secondary | ICD-10-CM

## 2020-04-07 DIAGNOSIS — Z992 Dependence on renal dialysis: Secondary | ICD-10-CM | POA: Diagnosis not present

## 2020-04-07 DIAGNOSIS — H43813 Vitreous degeneration, bilateral: Secondary | ICD-10-CM

## 2020-04-08 DIAGNOSIS — Z992 Dependence on renal dialysis: Secondary | ICD-10-CM | POA: Diagnosis not present

## 2020-04-08 DIAGNOSIS — N2581 Secondary hyperparathyroidism of renal origin: Secondary | ICD-10-CM | POA: Diagnosis not present

## 2020-04-08 DIAGNOSIS — N186 End stage renal disease: Secondary | ICD-10-CM | POA: Diagnosis not present

## 2020-04-10 DIAGNOSIS — N2581 Secondary hyperparathyroidism of renal origin: Secondary | ICD-10-CM | POA: Diagnosis not present

## 2020-04-10 DIAGNOSIS — Z992 Dependence on renal dialysis: Secondary | ICD-10-CM | POA: Diagnosis not present

## 2020-04-10 DIAGNOSIS — N186 End stage renal disease: Secondary | ICD-10-CM | POA: Diagnosis not present

## 2020-04-15 DIAGNOSIS — N186 End stage renal disease: Secondary | ICD-10-CM | POA: Diagnosis not present

## 2020-04-15 DIAGNOSIS — N2581 Secondary hyperparathyroidism of renal origin: Secondary | ICD-10-CM | POA: Diagnosis not present

## 2020-04-15 DIAGNOSIS — Z992 Dependence on renal dialysis: Secondary | ICD-10-CM | POA: Diagnosis not present

## 2020-04-17 DIAGNOSIS — N2581 Secondary hyperparathyroidism of renal origin: Secondary | ICD-10-CM | POA: Diagnosis not present

## 2020-04-17 DIAGNOSIS — N186 End stage renal disease: Secondary | ICD-10-CM | POA: Diagnosis not present

## 2020-04-17 DIAGNOSIS — Z992 Dependence on renal dialysis: Secondary | ICD-10-CM | POA: Diagnosis not present

## 2020-04-20 DIAGNOSIS — N2581 Secondary hyperparathyroidism of renal origin: Secondary | ICD-10-CM | POA: Diagnosis not present

## 2020-04-20 DIAGNOSIS — N186 End stage renal disease: Secondary | ICD-10-CM | POA: Diagnosis not present

## 2020-04-20 DIAGNOSIS — Z992 Dependence on renal dialysis: Secondary | ICD-10-CM | POA: Diagnosis not present

## 2020-04-22 DIAGNOSIS — N186 End stage renal disease: Secondary | ICD-10-CM | POA: Diagnosis not present

## 2020-04-22 DIAGNOSIS — Z992 Dependence on renal dialysis: Secondary | ICD-10-CM | POA: Diagnosis not present

## 2020-04-22 DIAGNOSIS — N2581 Secondary hyperparathyroidism of renal origin: Secondary | ICD-10-CM | POA: Diagnosis not present

## 2020-04-24 DIAGNOSIS — N2581 Secondary hyperparathyroidism of renal origin: Secondary | ICD-10-CM | POA: Diagnosis not present

## 2020-04-24 DIAGNOSIS — Z992 Dependence on renal dialysis: Secondary | ICD-10-CM | POA: Diagnosis not present

## 2020-04-24 DIAGNOSIS — N186 End stage renal disease: Secondary | ICD-10-CM | POA: Diagnosis not present

## 2020-04-27 DIAGNOSIS — N2581 Secondary hyperparathyroidism of renal origin: Secondary | ICD-10-CM | POA: Diagnosis not present

## 2020-04-27 DIAGNOSIS — Z992 Dependence on renal dialysis: Secondary | ICD-10-CM | POA: Diagnosis not present

## 2020-04-27 DIAGNOSIS — N186 End stage renal disease: Secondary | ICD-10-CM | POA: Diagnosis not present

## 2020-04-29 DIAGNOSIS — N2581 Secondary hyperparathyroidism of renal origin: Secondary | ICD-10-CM | POA: Diagnosis not present

## 2020-04-29 DIAGNOSIS — N186 End stage renal disease: Secondary | ICD-10-CM | POA: Diagnosis not present

## 2020-04-29 DIAGNOSIS — Z992 Dependence on renal dialysis: Secondary | ICD-10-CM | POA: Diagnosis not present

## 2020-05-01 DIAGNOSIS — Z992 Dependence on renal dialysis: Secondary | ICD-10-CM | POA: Diagnosis not present

## 2020-05-01 DIAGNOSIS — N2581 Secondary hyperparathyroidism of renal origin: Secondary | ICD-10-CM | POA: Diagnosis not present

## 2020-05-01 DIAGNOSIS — N186 End stage renal disease: Secondary | ICD-10-CM | POA: Diagnosis not present

## 2020-05-02 DIAGNOSIS — N186 End stage renal disease: Secondary | ICD-10-CM | POA: Diagnosis not present

## 2020-05-02 DIAGNOSIS — Z992 Dependence on renal dialysis: Secondary | ICD-10-CM | POA: Diagnosis not present

## 2020-05-02 DIAGNOSIS — Q612 Polycystic kidney, adult type: Secondary | ICD-10-CM | POA: Diagnosis not present

## 2020-05-04 DIAGNOSIS — Z992 Dependence on renal dialysis: Secondary | ICD-10-CM | POA: Diagnosis not present

## 2020-05-04 DIAGNOSIS — N186 End stage renal disease: Secondary | ICD-10-CM | POA: Diagnosis not present

## 2020-05-04 DIAGNOSIS — N2581 Secondary hyperparathyroidism of renal origin: Secondary | ICD-10-CM | POA: Diagnosis not present

## 2020-05-06 DIAGNOSIS — N2581 Secondary hyperparathyroidism of renal origin: Secondary | ICD-10-CM | POA: Diagnosis not present

## 2020-05-06 DIAGNOSIS — N186 End stage renal disease: Secondary | ICD-10-CM | POA: Diagnosis not present

## 2020-05-06 DIAGNOSIS — Z992 Dependence on renal dialysis: Secondary | ICD-10-CM | POA: Diagnosis not present

## 2020-05-08 DIAGNOSIS — N2581 Secondary hyperparathyroidism of renal origin: Secondary | ICD-10-CM | POA: Diagnosis not present

## 2020-05-08 DIAGNOSIS — N186 End stage renal disease: Secondary | ICD-10-CM | POA: Diagnosis not present

## 2020-05-08 DIAGNOSIS — Z992 Dependence on renal dialysis: Secondary | ICD-10-CM | POA: Diagnosis not present

## 2020-05-11 DIAGNOSIS — N2581 Secondary hyperparathyroidism of renal origin: Secondary | ICD-10-CM | POA: Diagnosis not present

## 2020-05-11 DIAGNOSIS — Z992 Dependence on renal dialysis: Secondary | ICD-10-CM | POA: Diagnosis not present

## 2020-05-11 DIAGNOSIS — N186 End stage renal disease: Secondary | ICD-10-CM | POA: Diagnosis not present

## 2020-05-13 DIAGNOSIS — Z992 Dependence on renal dialysis: Secondary | ICD-10-CM | POA: Diagnosis not present

## 2020-05-13 DIAGNOSIS — N2581 Secondary hyperparathyroidism of renal origin: Secondary | ICD-10-CM | POA: Diagnosis not present

## 2020-05-13 DIAGNOSIS — N186 End stage renal disease: Secondary | ICD-10-CM | POA: Diagnosis not present

## 2020-05-15 DIAGNOSIS — N186 End stage renal disease: Secondary | ICD-10-CM | POA: Diagnosis not present

## 2020-05-15 DIAGNOSIS — Z992 Dependence on renal dialysis: Secondary | ICD-10-CM | POA: Diagnosis not present

## 2020-05-15 DIAGNOSIS — N2581 Secondary hyperparathyroidism of renal origin: Secondary | ICD-10-CM | POA: Diagnosis not present

## 2020-05-18 DIAGNOSIS — Z992 Dependence on renal dialysis: Secondary | ICD-10-CM | POA: Diagnosis not present

## 2020-05-18 DIAGNOSIS — N2581 Secondary hyperparathyroidism of renal origin: Secondary | ICD-10-CM | POA: Diagnosis not present

## 2020-05-18 DIAGNOSIS — N186 End stage renal disease: Secondary | ICD-10-CM | POA: Diagnosis not present

## 2020-05-19 ENCOUNTER — Other Ambulatory Visit: Payer: Self-pay | Admitting: Urology

## 2020-05-19 DIAGNOSIS — C61 Malignant neoplasm of prostate: Secondary | ICD-10-CM

## 2020-05-20 DIAGNOSIS — C61 Malignant neoplasm of prostate: Secondary | ICD-10-CM | POA: Diagnosis not present

## 2020-05-21 ENCOUNTER — Telehealth: Payer: Self-pay | Admitting: *Deleted

## 2020-05-21 DIAGNOSIS — N2581 Secondary hyperparathyroidism of renal origin: Secondary | ICD-10-CM | POA: Diagnosis not present

## 2020-05-21 DIAGNOSIS — Z992 Dependence on renal dialysis: Secondary | ICD-10-CM | POA: Diagnosis not present

## 2020-05-21 DIAGNOSIS — N186 End stage renal disease: Secondary | ICD-10-CM | POA: Diagnosis not present

## 2020-05-21 NOTE — Telephone Encounter (Signed)
RETURNED PATIENT'S PHONE CALL, SPOKE WITH PATIENT. ?

## 2020-05-22 ENCOUNTER — Ambulatory Visit: Payer: Medicare Other | Admitting: Radiation Oncology

## 2020-05-22 DIAGNOSIS — N2581 Secondary hyperparathyroidism of renal origin: Secondary | ICD-10-CM | POA: Diagnosis not present

## 2020-05-22 DIAGNOSIS — N186 End stage renal disease: Secondary | ICD-10-CM | POA: Diagnosis not present

## 2020-05-22 DIAGNOSIS — Z992 Dependence on renal dialysis: Secondary | ICD-10-CM | POA: Diagnosis not present

## 2020-05-25 ENCOUNTER — Telehealth: Payer: Self-pay | Admitting: *Deleted

## 2020-05-25 DIAGNOSIS — N2581 Secondary hyperparathyroidism of renal origin: Secondary | ICD-10-CM | POA: Diagnosis not present

## 2020-05-25 DIAGNOSIS — Z992 Dependence on renal dialysis: Secondary | ICD-10-CM | POA: Diagnosis not present

## 2020-05-25 DIAGNOSIS — N186 End stage renal disease: Secondary | ICD-10-CM | POA: Diagnosis not present

## 2020-05-25 NOTE — Telephone Encounter (Signed)
Called patient to inform of sim for 05-29-20- arrival time- 8:15 am @ [facility] and his MRI - arrival time- 9:30 am @ [facility], spoke with patient and he is aware of these appts.

## 2020-05-27 DIAGNOSIS — N2581 Secondary hyperparathyroidism of renal origin: Secondary | ICD-10-CM | POA: Diagnosis not present

## 2020-05-27 DIAGNOSIS — Z992 Dependence on renal dialysis: Secondary | ICD-10-CM | POA: Diagnosis not present

## 2020-05-27 DIAGNOSIS — N186 End stage renal disease: Secondary | ICD-10-CM | POA: Diagnosis not present

## 2020-05-28 ENCOUNTER — Telehealth: Payer: Self-pay | Admitting: *Deleted

## 2020-05-28 NOTE — Telephone Encounter (Signed)
Called patient to remind of sim and MRI for 05-29-20- arrival time- 8:15 am @ [facility] for sim and - arrival time - 9:30 am @ [facility] for MRI, spoke with patient and he is aware of these appts.

## 2020-05-29 ENCOUNTER — Ambulatory Visit
Admission: RE | Admit: 2020-05-29 | Discharge: 2020-05-29 | Disposition: A | Discharge status: Home / Self Care | Payer: Medicare Other | Source: Ambulatory Visit | Attending: Radiation Oncology | Admitting: Radiation Oncology

## 2020-05-29 ENCOUNTER — Encounter: Payer: Self-pay | Admitting: Medical Oncology

## 2020-05-29 ENCOUNTER — Other Ambulatory Visit: Payer: Self-pay

## 2020-05-29 ENCOUNTER — Ambulatory Visit (HOSPITAL_COMMUNITY)
Admission: RE | Admit: 2020-05-29 | Discharge: 2020-05-29 | Disposition: A | Discharge status: Home / Self Care | Payer: Medicare Other | Source: Ambulatory Visit | Attending: Urology | Admitting: Urology

## 2020-05-29 DIAGNOSIS — N186 End stage renal disease: Secondary | ICD-10-CM | POA: Diagnosis not present

## 2020-05-29 DIAGNOSIS — C61 Malignant neoplasm of prostate: Secondary | ICD-10-CM | POA: Insufficient documentation

## 2020-05-29 DIAGNOSIS — Z992 Dependence on renal dialysis: Secondary | ICD-10-CM | POA: Diagnosis not present

## 2020-05-29 DIAGNOSIS — N2581 Secondary hyperparathyroidism of renal origin: Secondary | ICD-10-CM | POA: Diagnosis not present

## 2020-05-29 NOTE — Progress Notes (Signed)
  Radiation Oncology         [id number]) [phone] ________________________________  Name: Michael [name]. [name]: [id number]  Date: 05/29/2020  DOB: [birth year]  SIMULATION AND TREATMENT PLANNING NOTE    ICD-10-CM   1. Malignant neoplasm of prostate ([place])  C61     DIAGNOSIS:  65 y.o. gentleman with Stage T1c adenocarcinoma of the prostate with a Gleason score of 4+3 and a PSA of 7.87.   NARRATIVE:  The patient was brought to the [facility].  Identity was confirmed.  All relevant records and images related to the planned course of therapy were reviewed.  The patient freely provided informed written consent to proceed with treatment after reviewing the details related to the planned course of therapy. The consent form was witnessed and verified by the simulation staff.  Then, the patient was set-up in a stable reproducible supine position for radiation therapy.  A vacuum lock pillow device was custom fabricated to position his legs in a reproducible immobilized position.  Then, I performed a urethrogram under sterile conditions to identify the prostatic apex.  CT images were obtained.  Surface markings were placed.  The CT images were loaded into the planning software.  Then the prostate target and avoidance structures including the rectum, bladder, bowel and hips were contoured.  Treatment planning then occurred.  The radiation prescription was entered and confirmed.  A total of one complex treatment devices was fabricated. I have requested : Intensity Modulated Radiotherapy (IMRT) is medically necessary for this case for the following reason:  Rectal sparing.[name]  PLAN:  The patient will receive 70 Gy in 28 fractions.  ________________________________  Sheral Apley Tammi Klippel, M.D.  This document serves as a record of services personally performed by Tyler Pita, MD. It was created on his behalf by Wilburn Mylar, a trained medical scribe. The creation of this record is based on the  scribe's personal observations and the provider's statements to them. This document has been checked and approved by the attending provider.

## 2020-06-01 DIAGNOSIS — Z992 Dependence on renal dialysis: Secondary | ICD-10-CM | POA: Diagnosis not present

## 2020-06-01 DIAGNOSIS — I871 Compression of vein: Secondary | ICD-10-CM | POA: Diagnosis not present

## 2020-06-01 DIAGNOSIS — C61 Malignant neoplasm of prostate: Secondary | ICD-10-CM | POA: Diagnosis not present

## 2020-06-01 DIAGNOSIS — T82898A Other specified complication of vascular prosthetic devices, implants and grafts, initial encounter: Secondary | ICD-10-CM | POA: Diagnosis not present

## 2020-06-01 DIAGNOSIS — N186 End stage renal disease: Secondary | ICD-10-CM | POA: Diagnosis not present

## 2020-06-02 DIAGNOSIS — Q612 Polycystic kidney, adult type: Secondary | ICD-10-CM | POA: Diagnosis not present

## 2020-06-02 DIAGNOSIS — N186 End stage renal disease: Secondary | ICD-10-CM | POA: Diagnosis not present

## 2020-06-02 DIAGNOSIS — Z992 Dependence on renal dialysis: Secondary | ICD-10-CM | POA: Diagnosis not present

## 2020-06-03 DIAGNOSIS — Z992 Dependence on renal dialysis: Secondary | ICD-10-CM | POA: Diagnosis not present

## 2020-06-03 DIAGNOSIS — N186 End stage renal disease: Secondary | ICD-10-CM | POA: Diagnosis not present

## 2020-06-03 DIAGNOSIS — N2581 Secondary hyperparathyroidism of renal origin: Secondary | ICD-10-CM | POA: Diagnosis not present

## 2020-06-05 DIAGNOSIS — N186 End stage renal disease: Secondary | ICD-10-CM | POA: Diagnosis not present

## 2020-06-05 DIAGNOSIS — Z992 Dependence on renal dialysis: Secondary | ICD-10-CM | POA: Diagnosis not present

## 2020-06-05 DIAGNOSIS — N2581 Secondary hyperparathyroidism of renal origin: Secondary | ICD-10-CM | POA: Diagnosis not present

## 2020-06-08 DIAGNOSIS — N2581 Secondary hyperparathyroidism of renal origin: Secondary | ICD-10-CM | POA: Diagnosis not present

## 2020-06-08 DIAGNOSIS — N186 End stage renal disease: Secondary | ICD-10-CM | POA: Diagnosis not present

## 2020-06-08 DIAGNOSIS — Z992 Dependence on renal dialysis: Secondary | ICD-10-CM | POA: Diagnosis not present

## 2020-06-09 ENCOUNTER — Ambulatory Visit
Admission: RE | Admit: 2020-06-09 | Discharge: 2020-06-09 | Disposition: A | Discharge status: Home / Self Care | Payer: Medicare Other | Source: Ambulatory Visit | Attending: Radiation Oncology | Admitting: Radiation Oncology

## 2020-06-09 DIAGNOSIS — C61 Malignant neoplasm of prostate: Secondary | ICD-10-CM | POA: Insufficient documentation

## 2020-06-10 ENCOUNTER — Other Ambulatory Visit: Payer: Self-pay

## 2020-06-10 ENCOUNTER — Ambulatory Visit
Admission: RE | Admit: 2020-06-10 | Discharge: 2020-06-10 | Disposition: A | Discharge status: Home / Self Care | Payer: Medicare Other | Source: Ambulatory Visit | Attending: Radiation Oncology | Admitting: Radiation Oncology

## 2020-06-10 DIAGNOSIS — Z992 Dependence on renal dialysis: Secondary | ICD-10-CM | POA: Diagnosis not present

## 2020-06-10 DIAGNOSIS — N186 End stage renal disease: Secondary | ICD-10-CM | POA: Diagnosis not present

## 2020-06-10 DIAGNOSIS — C61 Malignant neoplasm of prostate: Secondary | ICD-10-CM | POA: Diagnosis not present

## 2020-06-10 DIAGNOSIS — N2581 Secondary hyperparathyroidism of renal origin: Secondary | ICD-10-CM | POA: Diagnosis not present

## 2020-06-11 ENCOUNTER — Ambulatory Visit
Admission: RE | Admit: 2020-06-11 | Discharge: 2020-06-11 | Disposition: A | Discharge status: Home / Self Care | Payer: Medicare Other | Source: Ambulatory Visit | Attending: Radiation Oncology | Admitting: Radiation Oncology

## 2020-06-11 DIAGNOSIS — C61 Malignant neoplasm of prostate: Secondary | ICD-10-CM | POA: Diagnosis not present

## 2020-06-12 ENCOUNTER — Ambulatory Visit
Admission: RE | Admit: 2020-06-12 | Discharge: 2020-06-12 | Disposition: A | Discharge status: Home / Self Care | Payer: Medicare Other | Source: Ambulatory Visit | Attending: Radiation Oncology | Admitting: Radiation Oncology

## 2020-06-12 ENCOUNTER — Other Ambulatory Visit: Payer: Self-pay

## 2020-06-12 DIAGNOSIS — Z992 Dependence on renal dialysis: Secondary | ICD-10-CM | POA: Diagnosis not present

## 2020-06-12 DIAGNOSIS — N2581 Secondary hyperparathyroidism of renal origin: Secondary | ICD-10-CM | POA: Diagnosis not present

## 2020-06-12 DIAGNOSIS — C61 Malignant neoplasm of prostate: Secondary | ICD-10-CM | POA: Diagnosis not present

## 2020-06-12 DIAGNOSIS — N186 End stage renal disease: Secondary | ICD-10-CM | POA: Diagnosis not present

## 2020-06-15 ENCOUNTER — Other Ambulatory Visit: Payer: Self-pay

## 2020-06-15 ENCOUNTER — Ambulatory Visit
Admission: RE | Admit: 2020-06-15 | Discharge: 2020-06-15 | Disposition: A | Discharge status: Home / Self Care | Payer: Medicare Other | Source: Ambulatory Visit | Attending: Radiation Oncology | Admitting: Radiation Oncology

## 2020-06-15 DIAGNOSIS — Z992 Dependence on renal dialysis: Secondary | ICD-10-CM | POA: Diagnosis not present

## 2020-06-15 DIAGNOSIS — C61 Malignant neoplasm of prostate: Secondary | ICD-10-CM | POA: Diagnosis not present

## 2020-06-15 DIAGNOSIS — N2581 Secondary hyperparathyroidism of renal origin: Secondary | ICD-10-CM | POA: Diagnosis not present

## 2020-06-15 DIAGNOSIS — N186 End stage renal disease: Secondary | ICD-10-CM | POA: Diagnosis not present

## 2020-06-16 ENCOUNTER — Ambulatory Visit
Admission: RE | Admit: 2020-06-16 | Discharge: 2020-06-16 | Disposition: A | Discharge status: Home / Self Care | Payer: Medicare Other | Source: Ambulatory Visit | Attending: Radiation Oncology | Admitting: Radiation Oncology

## 2020-06-16 ENCOUNTER — Other Ambulatory Visit: Payer: Self-pay

## 2020-06-16 DIAGNOSIS — C61 Malignant neoplasm of prostate: Secondary | ICD-10-CM | POA: Diagnosis not present

## 2020-06-17 ENCOUNTER — Ambulatory Visit
Admission: RE | Admit: 2020-06-17 | Discharge: 2020-06-17 | Disposition: A | Discharge status: Home / Self Care | Payer: Medicare Other | Source: Ambulatory Visit | Attending: Radiation Oncology | Admitting: Radiation Oncology

## 2020-06-17 DIAGNOSIS — N2581 Secondary hyperparathyroidism of renal origin: Secondary | ICD-10-CM | POA: Diagnosis not present

## 2020-06-17 DIAGNOSIS — N186 End stage renal disease: Secondary | ICD-10-CM | POA: Diagnosis not present

## 2020-06-17 DIAGNOSIS — Z992 Dependence on renal dialysis: Secondary | ICD-10-CM | POA: Diagnosis not present

## 2020-06-17 DIAGNOSIS — C61 Malignant neoplasm of prostate: Secondary | ICD-10-CM | POA: Diagnosis not present

## 2020-06-18 ENCOUNTER — Ambulatory Visit
Admission: RE | Admit: 2020-06-18 | Discharge: 2020-06-18 | Disposition: A | Discharge status: Home / Self Care | Payer: Medicare Other | Source: Ambulatory Visit | Attending: Radiation Oncology | Admitting: Radiation Oncology

## 2020-06-18 DIAGNOSIS — C61 Malignant neoplasm of prostate: Secondary | ICD-10-CM | POA: Diagnosis not present

## 2020-06-19 ENCOUNTER — Ambulatory Visit
Admission: RE | Admit: 2020-06-19 | Discharge: 2020-06-19 | Disposition: A | Discharge status: Home / Self Care | Payer: Medicare Other | Source: Ambulatory Visit | Attending: Radiation Oncology | Admitting: Radiation Oncology

## 2020-06-19 ENCOUNTER — Other Ambulatory Visit: Payer: Self-pay

## 2020-06-19 DIAGNOSIS — C61 Malignant neoplasm of prostate: Secondary | ICD-10-CM | POA: Diagnosis not present

## 2020-06-19 DIAGNOSIS — N2581 Secondary hyperparathyroidism of renal origin: Secondary | ICD-10-CM | POA: Diagnosis not present

## 2020-06-19 DIAGNOSIS — Z992 Dependence on renal dialysis: Secondary | ICD-10-CM | POA: Diagnosis not present

## 2020-06-19 DIAGNOSIS — N186 End stage renal disease: Secondary | ICD-10-CM | POA: Diagnosis not present

## 2020-06-22 ENCOUNTER — Ambulatory Visit
Admission: RE | Admit: 2020-06-22 | Discharge: 2020-06-22 | Disposition: A | Discharge status: Home / Self Care | Payer: Medicare Other | Source: Ambulatory Visit | Attending: Radiation Oncology | Admitting: Radiation Oncology

## 2020-06-22 ENCOUNTER — Other Ambulatory Visit: Payer: Self-pay

## 2020-06-22 DIAGNOSIS — C61 Malignant neoplasm of prostate: Secondary | ICD-10-CM | POA: Diagnosis not present

## 2020-06-22 DIAGNOSIS — Z992 Dependence on renal dialysis: Secondary | ICD-10-CM | POA: Diagnosis not present

## 2020-06-22 DIAGNOSIS — N186 End stage renal disease: Secondary | ICD-10-CM | POA: Diagnosis not present

## 2020-06-22 DIAGNOSIS — N2581 Secondary hyperparathyroidism of renal origin: Secondary | ICD-10-CM | POA: Diagnosis not present

## 2020-06-23 ENCOUNTER — Other Ambulatory Visit: Payer: Self-pay

## 2020-06-23 ENCOUNTER — Ambulatory Visit
Admission: RE | Admit: 2020-06-23 | Discharge: 2020-06-23 | Disposition: A | Discharge status: Home / Self Care | Payer: Medicare Other | Source: Ambulatory Visit | Attending: Radiation Oncology | Admitting: Radiation Oncology

## 2020-06-23 DIAGNOSIS — C61 Malignant neoplasm of prostate: Secondary | ICD-10-CM | POA: Diagnosis not present

## 2020-06-24 ENCOUNTER — Ambulatory Visit: Payer: Medicare Other

## 2020-06-24 DIAGNOSIS — Z992 Dependence on renal dialysis: Secondary | ICD-10-CM | POA: Diagnosis not present

## 2020-06-24 DIAGNOSIS — N186 End stage renal disease: Secondary | ICD-10-CM | POA: Diagnosis not present

## 2020-06-24 DIAGNOSIS — N2581 Secondary hyperparathyroidism of renal origin: Secondary | ICD-10-CM | POA: Diagnosis not present

## 2020-06-25 ENCOUNTER — Ambulatory Visit
Admission: RE | Admit: 2020-06-25 | Discharge: 2020-06-25 | Disposition: A | Discharge status: Home / Self Care | Payer: Medicare Other | Source: Ambulatory Visit | Attending: Radiation Oncology | Admitting: Radiation Oncology

## 2020-06-25 DIAGNOSIS — C61 Malignant neoplasm of prostate: Secondary | ICD-10-CM | POA: Diagnosis not present

## 2020-06-26 ENCOUNTER — Other Ambulatory Visit: Payer: Self-pay

## 2020-06-26 ENCOUNTER — Ambulatory Visit
Admission: RE | Admit: 2020-06-26 | Discharge: 2020-06-26 | Disposition: A | Discharge status: Home / Self Care | Payer: Medicare Other | Source: Ambulatory Visit | Attending: Radiation Oncology | Admitting: Radiation Oncology

## 2020-06-26 DIAGNOSIS — N186 End stage renal disease: Secondary | ICD-10-CM | POA: Diagnosis not present

## 2020-06-26 DIAGNOSIS — Z992 Dependence on renal dialysis: Secondary | ICD-10-CM | POA: Diagnosis not present

## 2020-06-26 DIAGNOSIS — C61 Malignant neoplasm of prostate: Secondary | ICD-10-CM | POA: Diagnosis not present

## 2020-06-26 DIAGNOSIS — N2581 Secondary hyperparathyroidism of renal origin: Secondary | ICD-10-CM | POA: Diagnosis not present

## 2020-06-29 ENCOUNTER — Other Ambulatory Visit: Payer: Self-pay

## 2020-06-29 ENCOUNTER — Ambulatory Visit
Admission: RE | Admit: 2020-06-29 | Discharge: 2020-06-29 | Disposition: A | Discharge status: Home / Self Care | Payer: Medicare Other | Source: Ambulatory Visit | Attending: Radiation Oncology | Admitting: Radiation Oncology

## 2020-06-29 DIAGNOSIS — C61 Malignant neoplasm of prostate: Secondary | ICD-10-CM | POA: Diagnosis not present

## 2020-06-30 ENCOUNTER — Ambulatory Visit
Admission: RE | Admit: 2020-06-30 | Discharge: 2020-06-30 | Disposition: A | Discharge status: Home / Self Care | Payer: Medicare Other | Source: Ambulatory Visit | Attending: Radiation Oncology | Admitting: Radiation Oncology

## 2020-06-30 ENCOUNTER — Other Ambulatory Visit: Payer: Self-pay

## 2020-06-30 DIAGNOSIS — C61 Malignant neoplasm of prostate: Secondary | ICD-10-CM | POA: Insufficient documentation

## 2020-06-30 DIAGNOSIS — Z992 Dependence on renal dialysis: Secondary | ICD-10-CM | POA: Diagnosis not present

## 2020-06-30 DIAGNOSIS — N2581 Secondary hyperparathyroidism of renal origin: Secondary | ICD-10-CM | POA: Diagnosis not present

## 2020-06-30 DIAGNOSIS — Q612 Polycystic kidney, adult type: Secondary | ICD-10-CM | POA: Diagnosis not present

## 2020-06-30 DIAGNOSIS — N186 End stage renal disease: Secondary | ICD-10-CM | POA: Diagnosis not present

## 2020-07-01 ENCOUNTER — Other Ambulatory Visit: Payer: Self-pay

## 2020-07-01 ENCOUNTER — Ambulatory Visit
Admission: RE | Admit: 2020-07-01 | Discharge: 2020-07-01 | Disposition: A | Discharge status: Home / Self Care | Payer: Medicare Other | Source: Ambulatory Visit | Attending: Radiation Oncology | Admitting: Radiation Oncology

## 2020-07-01 DIAGNOSIS — N2581 Secondary hyperparathyroidism of renal origin: Secondary | ICD-10-CM | POA: Diagnosis not present

## 2020-07-01 DIAGNOSIS — C61 Malignant neoplasm of prostate: Secondary | ICD-10-CM | POA: Diagnosis not present

## 2020-07-01 DIAGNOSIS — Z992 Dependence on renal dialysis: Secondary | ICD-10-CM | POA: Diagnosis not present

## 2020-07-01 DIAGNOSIS — N186 End stage renal disease: Secondary | ICD-10-CM | POA: Diagnosis not present

## 2020-07-02 ENCOUNTER — Ambulatory Visit
Admission: RE | Admit: 2020-07-02 | Discharge: 2020-07-02 | Disposition: A | Discharge status: Home / Self Care | Payer: Medicare Other | Source: Ambulatory Visit | Attending: Radiation Oncology | Admitting: Radiation Oncology

## 2020-07-02 ENCOUNTER — Other Ambulatory Visit: Payer: Self-pay

## 2020-07-02 DIAGNOSIS — C61 Malignant neoplasm of prostate: Secondary | ICD-10-CM | POA: Diagnosis not present

## 2020-07-03 ENCOUNTER — Ambulatory Visit
Admission: RE | Admit: 2020-07-03 | Discharge: 2020-07-03 | Disposition: A | Discharge status: Home / Self Care | Payer: Medicare Other | Source: Ambulatory Visit | Attending: Radiation Oncology | Admitting: Radiation Oncology

## 2020-07-03 DIAGNOSIS — C61 Malignant neoplasm of prostate: Secondary | ICD-10-CM | POA: Diagnosis not present

## 2020-07-03 DIAGNOSIS — N186 End stage renal disease: Secondary | ICD-10-CM | POA: Diagnosis not present

## 2020-07-03 DIAGNOSIS — Z992 Dependence on renal dialysis: Secondary | ICD-10-CM | POA: Diagnosis not present

## 2020-07-03 DIAGNOSIS — N2581 Secondary hyperparathyroidism of renal origin: Secondary | ICD-10-CM | POA: Diagnosis not present

## 2020-07-06 ENCOUNTER — Ambulatory Visit
Admission: RE | Admit: 2020-07-06 | Discharge: 2020-07-06 | Disposition: A | Discharge status: Home / Self Care | Payer: Medicare Other | Source: Ambulatory Visit | Attending: Radiation Oncology | Admitting: Radiation Oncology

## 2020-07-06 ENCOUNTER — Other Ambulatory Visit: Payer: Self-pay

## 2020-07-06 DIAGNOSIS — N186 End stage renal disease: Secondary | ICD-10-CM | POA: Diagnosis not present

## 2020-07-06 DIAGNOSIS — Z992 Dependence on renal dialysis: Secondary | ICD-10-CM | POA: Diagnosis not present

## 2020-07-06 DIAGNOSIS — C61 Malignant neoplasm of prostate: Secondary | ICD-10-CM | POA: Diagnosis not present

## 2020-07-06 DIAGNOSIS — N2581 Secondary hyperparathyroidism of renal origin: Secondary | ICD-10-CM | POA: Diagnosis not present

## 2020-07-07 ENCOUNTER — Ambulatory Visit
Admission: RE | Admit: 2020-07-07 | Discharge: 2020-07-07 | Disposition: A | Discharge status: Home / Self Care | Payer: Medicare Other | Source: Ambulatory Visit | Attending: Radiation Oncology | Admitting: Radiation Oncology

## 2020-07-07 DIAGNOSIS — C61 Malignant neoplasm of prostate: Secondary | ICD-10-CM | POA: Diagnosis not present

## 2020-07-08 ENCOUNTER — Other Ambulatory Visit: Payer: Self-pay

## 2020-07-08 ENCOUNTER — Ambulatory Visit
Admission: RE | Admit: 2020-07-08 | Discharge: 2020-07-08 | Disposition: A | Discharge status: Home / Self Care | Payer: Medicare Other | Source: Ambulatory Visit | Attending: Radiation Oncology | Admitting: Radiation Oncology

## 2020-07-08 DIAGNOSIS — N2581 Secondary hyperparathyroidism of renal origin: Secondary | ICD-10-CM | POA: Diagnosis not present

## 2020-07-08 DIAGNOSIS — C61 Malignant neoplasm of prostate: Secondary | ICD-10-CM | POA: Diagnosis not present

## 2020-07-08 DIAGNOSIS — N186 End stage renal disease: Secondary | ICD-10-CM | POA: Diagnosis not present

## 2020-07-08 DIAGNOSIS — Z992 Dependence on renal dialysis: Secondary | ICD-10-CM | POA: Diagnosis not present

## 2020-07-09 ENCOUNTER — Ambulatory Visit
Admission: RE | Admit: 2020-07-09 | Discharge: 2020-07-09 | Disposition: A | Discharge status: Home / Self Care | Payer: Medicare Other | Source: Ambulatory Visit | Attending: Radiation Oncology | Admitting: Radiation Oncology

## 2020-07-09 DIAGNOSIS — C61 Malignant neoplasm of prostate: Secondary | ICD-10-CM | POA: Diagnosis not present

## 2020-07-10 ENCOUNTER — Ambulatory Visit
Admission: RE | Admit: 2020-07-10 | Discharge: 2020-07-10 | Disposition: A | Discharge status: Home / Self Care | Payer: Medicare Other | Source: Ambulatory Visit | Attending: Radiation Oncology | Admitting: Radiation Oncology

## 2020-07-10 ENCOUNTER — Other Ambulatory Visit: Payer: Self-pay

## 2020-07-10 DIAGNOSIS — Z992 Dependence on renal dialysis: Secondary | ICD-10-CM | POA: Diagnosis not present

## 2020-07-10 DIAGNOSIS — N186 End stage renal disease: Secondary | ICD-10-CM | POA: Diagnosis not present

## 2020-07-10 DIAGNOSIS — C61 Malignant neoplasm of prostate: Secondary | ICD-10-CM | POA: Diagnosis not present

## 2020-07-10 DIAGNOSIS — N2581 Secondary hyperparathyroidism of renal origin: Secondary | ICD-10-CM | POA: Diagnosis not present

## 2020-07-13 ENCOUNTER — Ambulatory Visit
Admission: RE | Admit: 2020-07-13 | Discharge: 2020-07-13 | Disposition: A | Discharge status: Home / Self Care | Payer: Medicare Other | Source: Ambulatory Visit | Attending: Radiation Oncology | Admitting: Radiation Oncology

## 2020-07-13 ENCOUNTER — Other Ambulatory Visit: Payer: Self-pay

## 2020-07-13 DIAGNOSIS — N2581 Secondary hyperparathyroidism of renal origin: Secondary | ICD-10-CM | POA: Diagnosis not present

## 2020-07-13 DIAGNOSIS — C61 Malignant neoplasm of prostate: Secondary | ICD-10-CM | POA: Diagnosis not present

## 2020-07-13 DIAGNOSIS — Z992 Dependence on renal dialysis: Secondary | ICD-10-CM | POA: Diagnosis not present

## 2020-07-13 DIAGNOSIS — N186 End stage renal disease: Secondary | ICD-10-CM | POA: Diagnosis not present

## 2020-07-14 ENCOUNTER — Ambulatory Visit
Admission: RE | Admit: 2020-07-14 | Discharge: 2020-07-14 | Disposition: A | Discharge status: Home / Self Care | Payer: Medicare Other | Source: Ambulatory Visit | Attending: Radiation Oncology | Admitting: Radiation Oncology

## 2020-07-14 DIAGNOSIS — C61 Malignant neoplasm of prostate: Secondary | ICD-10-CM | POA: Diagnosis not present

## 2020-07-15 ENCOUNTER — Ambulatory Visit
Admission: RE | Admit: 2020-07-15 | Discharge: 2020-07-15 | Disposition: A | Discharge status: Home / Self Care | Payer: Medicare Other | Source: Ambulatory Visit | Attending: Radiation Oncology | Admitting: Radiation Oncology

## 2020-07-15 ENCOUNTER — Other Ambulatory Visit: Payer: Self-pay

## 2020-07-15 DIAGNOSIS — N186 End stage renal disease: Secondary | ICD-10-CM | POA: Diagnosis not present

## 2020-07-15 DIAGNOSIS — Z992 Dependence on renal dialysis: Secondary | ICD-10-CM | POA: Diagnosis not present

## 2020-07-15 DIAGNOSIS — N2581 Secondary hyperparathyroidism of renal origin: Secondary | ICD-10-CM | POA: Diagnosis not present

## 2020-07-15 DIAGNOSIS — C61 Malignant neoplasm of prostate: Secondary | ICD-10-CM | POA: Diagnosis not present

## 2020-07-16 ENCOUNTER — Ambulatory Visit
Admission: RE | Admit: 2020-07-16 | Discharge: 2020-07-16 | Disposition: A | Discharge status: Home / Self Care | Payer: Medicare Other | Source: Ambulatory Visit | Attending: Radiation Oncology | Admitting: Radiation Oncology

## 2020-07-16 ENCOUNTER — Ambulatory Visit: Payer: Medicare Other

## 2020-07-16 DIAGNOSIS — C61 Malignant neoplasm of prostate: Secondary | ICD-10-CM | POA: Diagnosis not present

## 2020-07-17 ENCOUNTER — Other Ambulatory Visit: Payer: Self-pay

## 2020-07-17 ENCOUNTER — Ambulatory Visit
Admission: RE | Admit: 2020-07-17 | Discharge: 2020-07-17 | Disposition: A | Discharge status: Home / Self Care | Payer: Medicare Other | Source: Ambulatory Visit | Attending: Radiation Oncology | Admitting: Radiation Oncology

## 2020-07-17 ENCOUNTER — Encounter: Payer: Self-pay | Admitting: Urology

## 2020-07-17 DIAGNOSIS — Z992 Dependence on renal dialysis: Secondary | ICD-10-CM | POA: Diagnosis not present

## 2020-07-17 DIAGNOSIS — C61 Malignant neoplasm of prostate: Secondary | ICD-10-CM | POA: Diagnosis not present

## 2020-07-17 DIAGNOSIS — N186 End stage renal disease: Secondary | ICD-10-CM | POA: Diagnosis not present

## 2020-07-17 DIAGNOSIS — N2581 Secondary hyperparathyroidism of renal origin: Secondary | ICD-10-CM | POA: Diagnosis not present

## 2020-07-20 DIAGNOSIS — Z992 Dependence on renal dialysis: Secondary | ICD-10-CM | POA: Diagnosis not present

## 2020-07-20 DIAGNOSIS — N2581 Secondary hyperparathyroidism of renal origin: Secondary | ICD-10-CM | POA: Diagnosis not present

## 2020-07-20 DIAGNOSIS — N186 End stage renal disease: Secondary | ICD-10-CM | POA: Diagnosis not present

## 2020-07-22 DIAGNOSIS — Z992 Dependence on renal dialysis: Secondary | ICD-10-CM | POA: Diagnosis not present

## 2020-07-22 DIAGNOSIS — N2581 Secondary hyperparathyroidism of renal origin: Secondary | ICD-10-CM | POA: Diagnosis not present

## 2020-07-22 DIAGNOSIS — N186 End stage renal disease: Secondary | ICD-10-CM | POA: Diagnosis not present

## 2020-07-27 DIAGNOSIS — Z992 Dependence on renal dialysis: Secondary | ICD-10-CM | POA: Diagnosis not present

## 2020-07-27 DIAGNOSIS — N2581 Secondary hyperparathyroidism of renal origin: Secondary | ICD-10-CM | POA: Diagnosis not present

## 2020-07-27 DIAGNOSIS — N186 End stage renal disease: Secondary | ICD-10-CM | POA: Diagnosis not present

## 2020-07-29 DIAGNOSIS — Z992 Dependence on renal dialysis: Secondary | ICD-10-CM | POA: Diagnosis not present

## 2020-07-29 DIAGNOSIS — N186 End stage renal disease: Secondary | ICD-10-CM | POA: Diagnosis not present

## 2020-07-29 DIAGNOSIS — N2581 Secondary hyperparathyroidism of renal origin: Secondary | ICD-10-CM | POA: Diagnosis not present

## 2020-07-31 DIAGNOSIS — Q612 Polycystic kidney, adult type: Secondary | ICD-10-CM | POA: Diagnosis not present

## 2020-07-31 DIAGNOSIS — D75839 Thrombocytosis, unspecified: Secondary | ICD-10-CM | POA: Diagnosis not present

## 2020-07-31 DIAGNOSIS — N2581 Secondary hyperparathyroidism of renal origin: Secondary | ICD-10-CM | POA: Diagnosis not present

## 2020-07-31 DIAGNOSIS — Z992 Dependence on renal dialysis: Secondary | ICD-10-CM | POA: Diagnosis not present

## 2020-07-31 DIAGNOSIS — N186 End stage renal disease: Secondary | ICD-10-CM | POA: Diagnosis not present

## 2020-08-03 DIAGNOSIS — N186 End stage renal disease: Secondary | ICD-10-CM | POA: Diagnosis not present

## 2020-08-03 DIAGNOSIS — N2581 Secondary hyperparathyroidism of renal origin: Secondary | ICD-10-CM | POA: Diagnosis not present

## 2020-08-03 DIAGNOSIS — D75839 Thrombocytosis, unspecified: Secondary | ICD-10-CM | POA: Diagnosis not present

## 2020-08-03 DIAGNOSIS — Z992 Dependence on renal dialysis: Secondary | ICD-10-CM | POA: Diagnosis not present

## 2020-08-05 DIAGNOSIS — Z992 Dependence on renal dialysis: Secondary | ICD-10-CM | POA: Diagnosis not present

## 2020-08-05 DIAGNOSIS — D75839 Thrombocytosis, unspecified: Secondary | ICD-10-CM | POA: Diagnosis not present

## 2020-08-05 DIAGNOSIS — N186 End stage renal disease: Secondary | ICD-10-CM | POA: Diagnosis not present

## 2020-08-05 DIAGNOSIS — N2581 Secondary hyperparathyroidism of renal origin: Secondary | ICD-10-CM | POA: Diagnosis not present

## 2020-08-07 DIAGNOSIS — N186 End stage renal disease: Secondary | ICD-10-CM | POA: Diagnosis not present

## 2020-08-07 DIAGNOSIS — Z992 Dependence on renal dialysis: Secondary | ICD-10-CM | POA: Diagnosis not present

## 2020-08-07 DIAGNOSIS — N2581 Secondary hyperparathyroidism of renal origin: Secondary | ICD-10-CM | POA: Diagnosis not present

## 2020-08-07 DIAGNOSIS — D75839 Thrombocytosis, unspecified: Secondary | ICD-10-CM | POA: Diagnosis not present

## 2020-08-10 DIAGNOSIS — N186 End stage renal disease: Secondary | ICD-10-CM | POA: Diagnosis not present

## 2020-08-10 DIAGNOSIS — Z992 Dependence on renal dialysis: Secondary | ICD-10-CM | POA: Diagnosis not present

## 2020-08-10 DIAGNOSIS — N2581 Secondary hyperparathyroidism of renal origin: Secondary | ICD-10-CM | POA: Diagnosis not present

## 2020-08-10 DIAGNOSIS — D75839 Thrombocytosis, unspecified: Secondary | ICD-10-CM | POA: Diagnosis not present

## 2020-08-11 DIAGNOSIS — N186 End stage renal disease: Secondary | ICD-10-CM | POA: Diagnosis not present

## 2020-08-11 DIAGNOSIS — C61 Malignant neoplasm of prostate: Secondary | ICD-10-CM | POA: Diagnosis not present

## 2020-08-12 DIAGNOSIS — N2581 Secondary hyperparathyroidism of renal origin: Secondary | ICD-10-CM | POA: Diagnosis not present

## 2020-08-12 DIAGNOSIS — D75839 Thrombocytosis, unspecified: Secondary | ICD-10-CM | POA: Diagnosis not present

## 2020-08-12 DIAGNOSIS — Z992 Dependence on renal dialysis: Secondary | ICD-10-CM | POA: Diagnosis not present

## 2020-08-12 DIAGNOSIS — N186 End stage renal disease: Secondary | ICD-10-CM | POA: Diagnosis not present

## 2020-08-14 DIAGNOSIS — D75839 Thrombocytosis, unspecified: Secondary | ICD-10-CM | POA: Diagnosis not present

## 2020-08-14 DIAGNOSIS — N2581 Secondary hyperparathyroidism of renal origin: Secondary | ICD-10-CM | POA: Diagnosis not present

## 2020-08-14 DIAGNOSIS — N186 End stage renal disease: Secondary | ICD-10-CM | POA: Diagnosis not present

## 2020-08-14 DIAGNOSIS — Z992 Dependence on renal dialysis: Secondary | ICD-10-CM | POA: Diagnosis not present

## 2020-08-17 ENCOUNTER — Other Ambulatory Visit: Payer: Self-pay

## 2020-08-17 ENCOUNTER — Encounter: Payer: Self-pay | Admitting: Urology

## 2020-08-17 ENCOUNTER — Telehealth: Payer: Self-pay

## 2020-08-17 NOTE — Progress Notes (Signed)
Patient has follow-up appointment with Ashlyn Bruning PA. Patient states that he is a dialysis patient.  Patient denies nocturia. Patient denies dysuria. Patient states that his diarrhea has cleared up since radiation treatment. Patient states that he feels like he is completely emptying his bladder. Patient denies urgency. Patient states having to occasionally push to start urination. Patient denies having fatigue. Patient denies leakage. Patient states that his PSA levels are at 3. Patient states that he has another follow-up appointment with [facility] in 3 months.

## 2020-08-17 NOTE — Telephone Encounter (Signed)
Spoke with patient in regards to appointment with Freeman Caldron PA on 08/26/20 @ 10:00am. Patient verbalized understanding of appointment date and time. Reviewed meaningful use AUA and prostate questions. TM

## 2020-08-19 ENCOUNTER — Encounter (HOSPITAL_BASED_OUTPATIENT_CLINIC_OR_DEPARTMENT_OTHER): Payer: Self-pay | Admitting: Emergency Medicine

## 2020-08-19 ENCOUNTER — Emergency Department (HOSPITAL_BASED_OUTPATIENT_CLINIC_OR_DEPARTMENT_OTHER): Payer: Medicare Other

## 2020-08-19 ENCOUNTER — Emergency Department (HOSPITAL_BASED_OUTPATIENT_CLINIC_OR_DEPARTMENT_OTHER)
Admission: EM | Admit: 2020-08-19 | Discharge: 2020-08-19 | Discharge status: Against Medical Advice | Payer: Medicare Other | Attending: Emergency Medicine | Admitting: Emergency Medicine

## 2020-08-19 ENCOUNTER — Other Ambulatory Visit: Payer: Self-pay

## 2020-08-19 DIAGNOSIS — R4701 Aphasia: Secondary | ICD-10-CM

## 2020-08-19 DIAGNOSIS — N186 End stage renal disease: Secondary | ICD-10-CM | POA: Diagnosis not present

## 2020-08-19 DIAGNOSIS — Z79899 Other long term (current) drug therapy: Secondary | ICD-10-CM | POA: Insufficient documentation

## 2020-08-19 DIAGNOSIS — N2581 Secondary hyperparathyroidism of renal origin: Secondary | ICD-10-CM | POA: Diagnosis not present

## 2020-08-19 DIAGNOSIS — Z7982 Long term (current) use of aspirin: Secondary | ICD-10-CM | POA: Insufficient documentation

## 2020-08-19 DIAGNOSIS — R479 Unspecified speech disturbances: Secondary | ICD-10-CM | POA: Diagnosis not present

## 2020-08-19 DIAGNOSIS — I4891 Unspecified atrial fibrillation: Secondary | ICD-10-CM | POA: Insufficient documentation

## 2020-08-19 DIAGNOSIS — I120 Hypertensive chronic kidney disease with stage 5 chronic kidney disease or end stage renal disease: Secondary | ICD-10-CM | POA: Diagnosis not present

## 2020-08-19 DIAGNOSIS — D75839 Thrombocytosis, unspecified: Secondary | ICD-10-CM | POA: Diagnosis not present

## 2020-08-19 DIAGNOSIS — Z992 Dependence on renal dialysis: Secondary | ICD-10-CM | POA: Insufficient documentation

## 2020-08-19 DIAGNOSIS — G459 Transient cerebral ischemic attack, unspecified: Secondary | ICD-10-CM | POA: Diagnosis not present

## 2020-08-19 DIAGNOSIS — Z20822 Contact with and (suspected) exposure to covid-19: Secondary | ICD-10-CM | POA: Insufficient documentation

## 2020-08-19 DIAGNOSIS — D582 Other hemoglobinopathies: Secondary | ICD-10-CM | POA: Diagnosis not present

## 2020-08-19 DIAGNOSIS — Z8546 Personal history of malignant neoplasm of prostate: Secondary | ICD-10-CM | POA: Diagnosis not present

## 2020-08-19 DIAGNOSIS — Z7901 Long term (current) use of anticoagulants: Secondary | ICD-10-CM | POA: Insufficient documentation

## 2020-08-19 LAB — CBC WITH DIFFERENTIAL/PLATELET
Abs Immature Granulocytes: 0 10*3/uL (ref 0.00–0.07)
Basophils Absolute: 0 10*3/uL (ref 0.0–0.1)
Basophils Relative: 2 %
Eosinophils Absolute: 0.1 10*3/uL (ref 0.0–0.5)
Eosinophils Relative: 3 %
HCT: 54.4 % — ABNORMAL HIGH (ref 39.0–52.0)
Hemoglobin: 17.5 g/dL — ABNORMAL HIGH (ref 13.0–17.0)
Immature Granulocytes: 0 %
Lymphocytes Relative: 12 %
Lymphs Abs: 0.3 10*3/uL — ABNORMAL LOW (ref 0.7–4.0)
MCH: 30.3 pg (ref 26.0–34.0)
MCHC: 32.2 g/dL (ref 30.0–36.0)
MCV: 94.3 fL (ref 80.0–100.0)
Monocytes Absolute: 0.3 10*3/uL (ref 0.1–1.0)
Monocytes Relative: 15 %
Neutro Abs: 1.6 10*3/uL — ABNORMAL LOW (ref 1.7–7.7)
Neutrophils Relative %: 68 %
Platelets: 98 10*3/uL — ABNORMAL LOW (ref 150–400)
RBC: 5.77 MIL/uL (ref 4.22–5.81)
RDW: 16.6 % — ABNORMAL HIGH (ref 11.5–15.5)
WBC: 2.3 10*3/uL — ABNORMAL LOW (ref 4.0–10.5)
nRBC: 0 % (ref 0.0–0.2)

## 2020-08-19 LAB — RESP PANEL BY RT-PCR (FLU A&B, COVID) ARPGX2
Influenza A by PCR: NEGATIVE
Influenza B by PCR: NEGATIVE
SARS Coronavirus 2 by RT PCR: NEGATIVE

## 2020-08-19 LAB — COMPREHENSIVE METABOLIC PANEL
ALT: 10 U/L (ref 0–44)
AST: 15 U/L (ref 15–41)
Albumin: 3.5 g/dL (ref 3.5–5.0)
Alkaline Phosphatase: 32 U/L — ABNORMAL LOW (ref 38–126)
Anion gap: 11 (ref 5–15)
BUN: 29 mg/dL — ABNORMAL HIGH (ref 8–23)
CO2: 32 mmol/L (ref 22–32)
Calcium: 8.8 mg/dL — ABNORMAL LOW (ref 8.9–10.3)
Chloride: 97 mmol/L — ABNORMAL LOW (ref 98–111)
Creatinine, Ser: 9.76 mg/dL — ABNORMAL HIGH (ref 0.61–1.24)
GFR, Estimated: 5 mL/min — ABNORMAL LOW (ref >60)
Glucose, Bld: 63 mg/dL — ABNORMAL LOW (ref 70–99)
Potassium: 4.1 mmol/L (ref 3.5–5.1)
Sodium: 140 mmol/L (ref 135–145)
Total Bilirubin: 2 mg/dL — ABNORMAL HIGH (ref 0.3–1.2)
Total Protein: 5.7 g/dL — ABNORMAL LOW (ref 6.5–8.1)

## 2020-08-19 LAB — PROTIME-INR
INR: 1.6 — ABNORMAL HIGH (ref 0.8–1.2)
Prothrombin Time: 18.7 seconds — ABNORMAL HIGH (ref 11.4–15.2)

## 2020-08-19 IMAGING — CT CT HEAD W/O CM
4 series · 17 of 47 positions shown, 19 images · non-contrast
Comparison: [DATE]

CLINICAL DATA: Difficulty speaking and lightheadedness this
afternoon

EXAM:
CT HEAD WITHOUT CONTRAST
TECHNIQUE: Contiguous axial images were obtained from the base of the skull
through the vertex without intravenous contrast.

[Series 2: head bone · axial · 0.42mm/px · z∈[-52,+2]mm · 4 of 77 slices shown]
[im 8/77  bone]
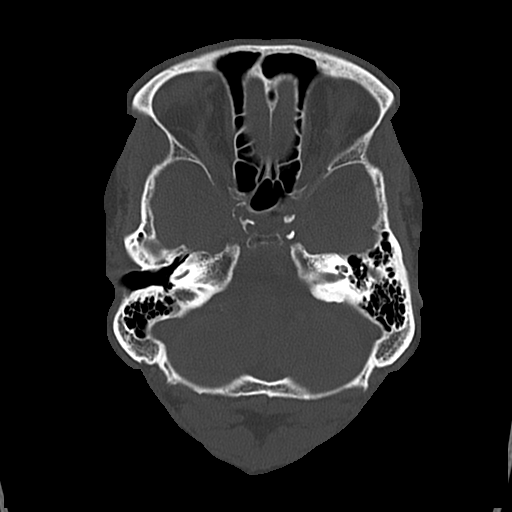
[im 16/77  bone]
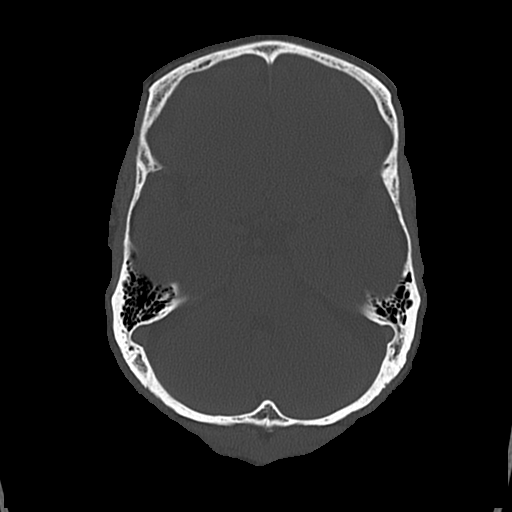
[im 23/77  bone]
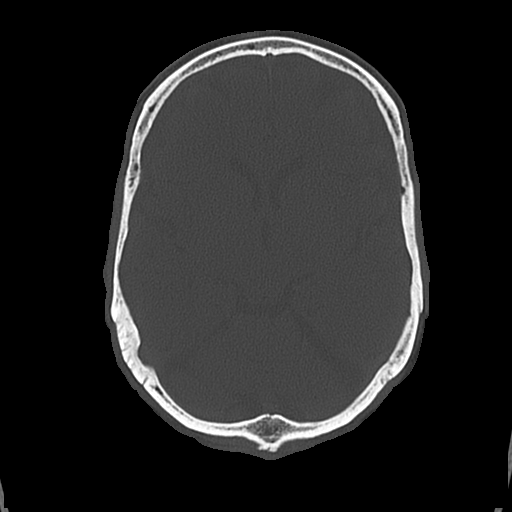
[im 35/77  bone]
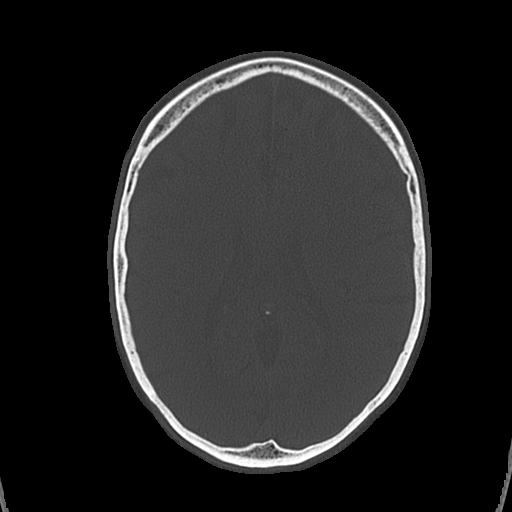

[Series 3: head wo · axial · 0.42mm/px · z∈[-51,+64]mm · 7 of 31 slices shown, 9 images]
[im 4/31  brain]
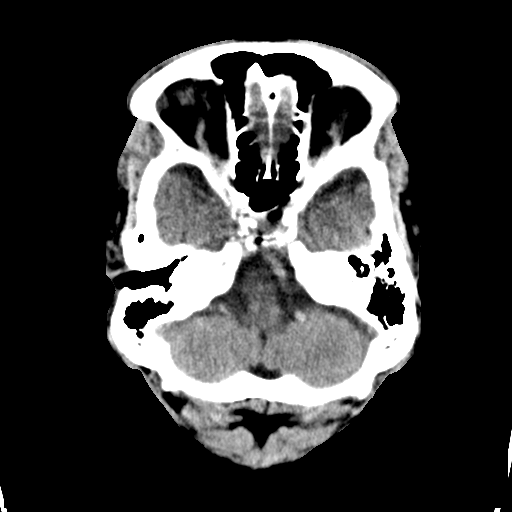
[im 4/31  bone]
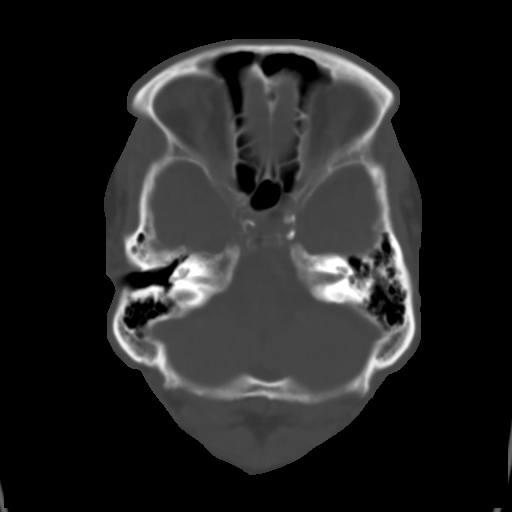
[im 8/31  brain]
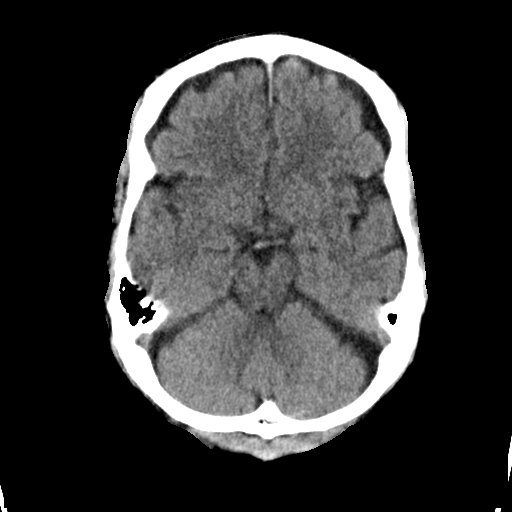
[im 12/31  brain]
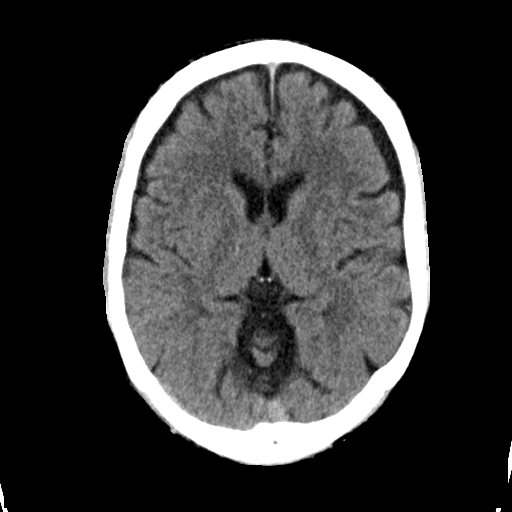
[im 16/31  brain]
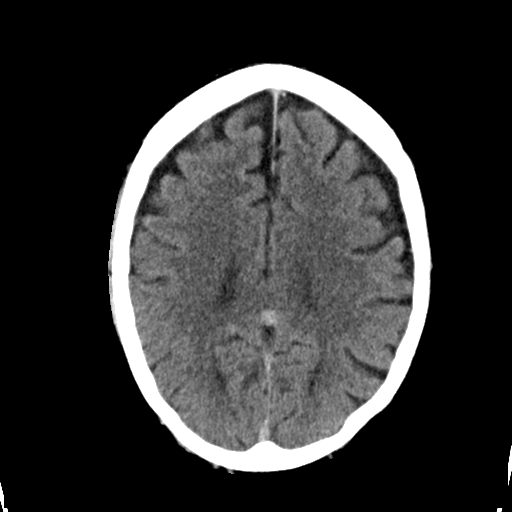
[im 19/31  brain]
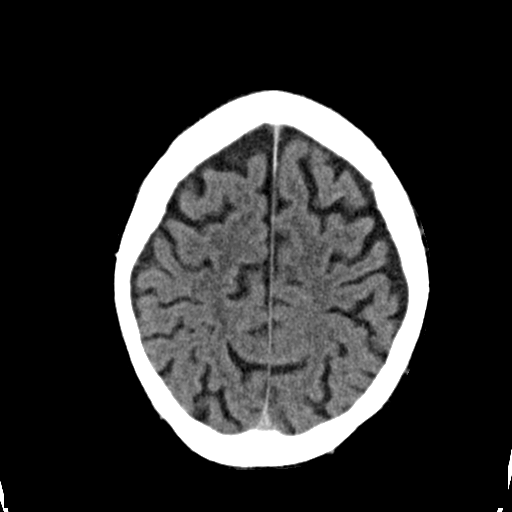
[im 19/31  bone]
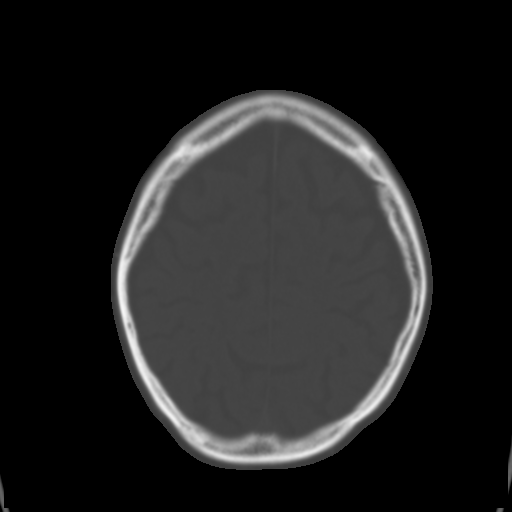
[im 23/31  brain]
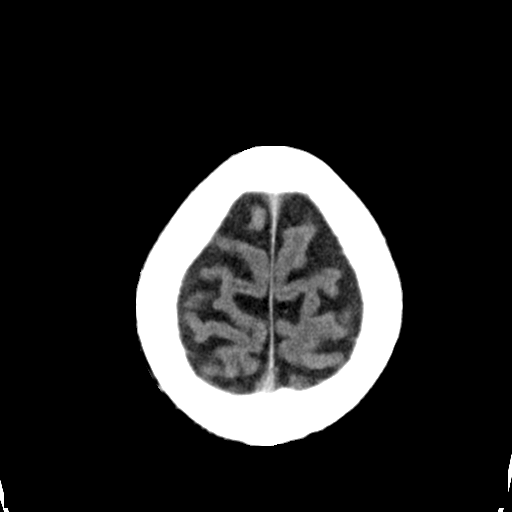
[im 27/31  brain]
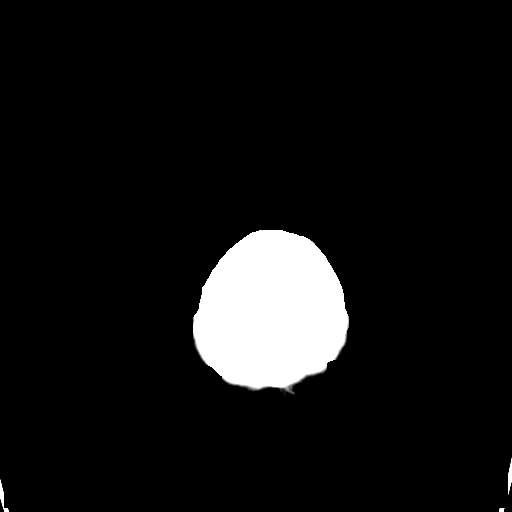

[Series 4: coronal soft · coronal · 0.29mm/px · 3 of 63 slices shown]
[im 21/63  brain]
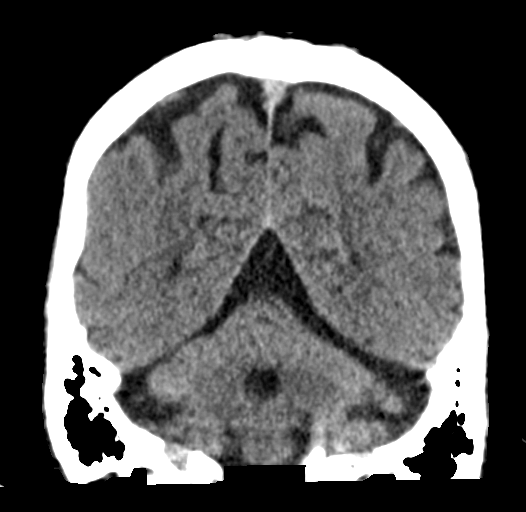
[im 28/63  brain]
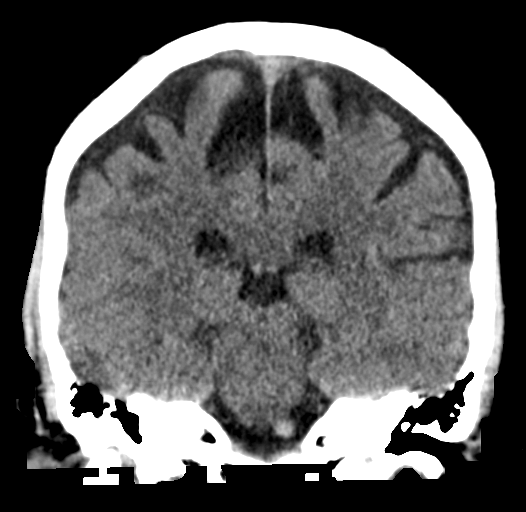
[im 35/63  brain]
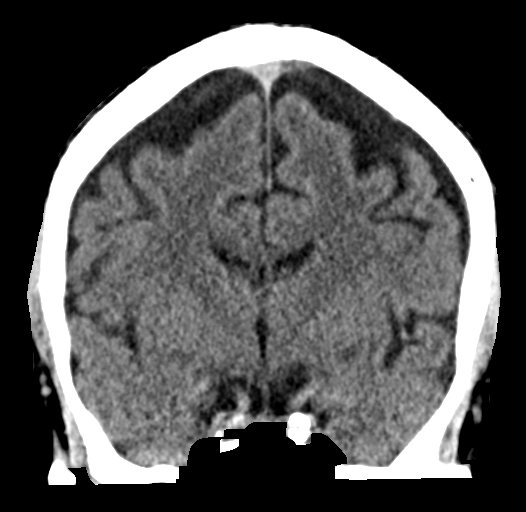

[Series 5: sagittal soft · sagittal · 0.29mm/px · 3 of 51 slices shown]
[im 17/51  brain]
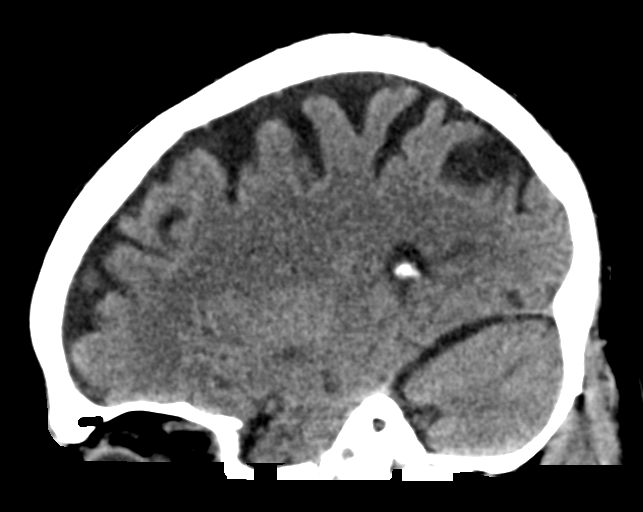
[im 26/51  brain]
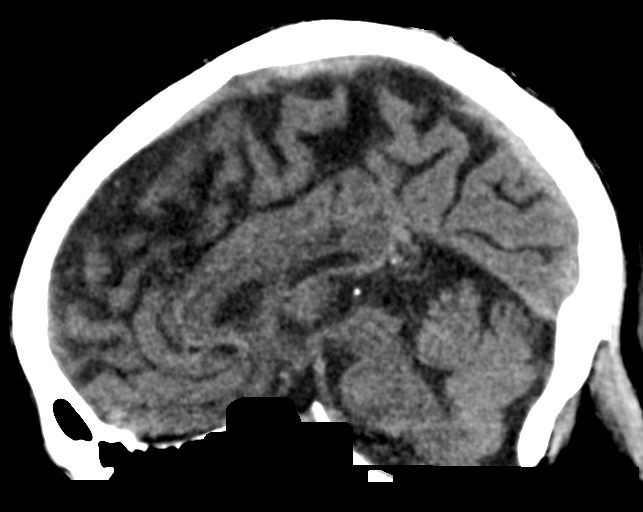
[im 34/51  brain]
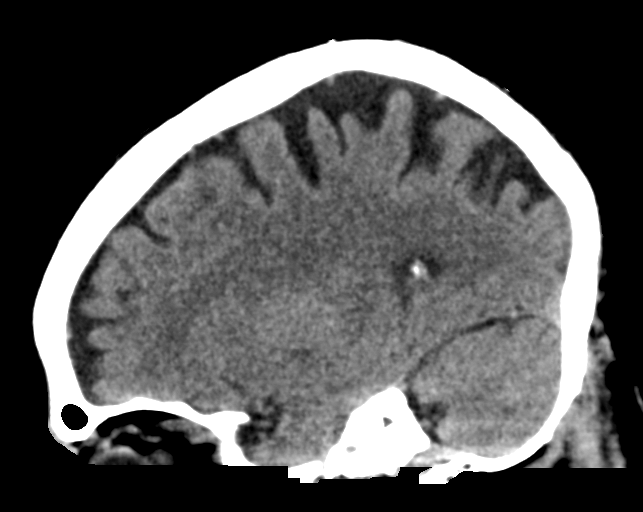

[17 of 47 positions shown; findings below may reference images not displayed]

FINDINGS: Brain: No evidence of acute infarction, hemorrhage, hydrocephalus,
extra-axial collection or mass lesion/mass effect. Chronic atrophic
and ischemic changes are noted.

Vascular: No hyperdense vessel or unexpected calcification.

Skull: Normal. Negative for fracture or focal lesion.

Sinuses/Orbits: No acute finding.

Other: None.
IMPRESSION: Chronic atrophic and ischemic changes without acute abnormality.

## 2020-08-19 NOTE — ED Triage Notes (Signed)
At 1430 patient briefly had trouble speaking and lightheadedness for 2-3 minutes per patient, symptoms then resolved and patient went to a walk in clinic which sent him here.  No current symptoms or complaints, denies pain, dizziness, weakness, nausea, fever.

## 2020-08-19 NOTE — ED Notes (Signed)
Called [facility] to transport patient to [facility] [id number]

## 2020-08-19 NOTE — ED Provider Notes (Addendum)
[facility] EMERGENCY DEPT Provider Note   CSN: [id number] Arrival date & time: 08/19/20  1656     History Chief Complaint  Patient presents with  . Aphasia    Michael [name]. is a 65 y.o. male.  HPI   65 year old male past medical history of atrial fibrillation anticoagulated on Eliquis, HTN, HLD, prostate cancer, end-stage renal disease on HD every MWF presents to the emergency department with concern for speech change and right hand weakness.  Patient states approximately 3 hours prior to arrival he experienced loss of speech and weakness in the right hand.  He states that he knew what he wanted to say but it was all coming out jumbled and incoherent words.  He was holding his glasses in his right hand and says that he dropped them multiple times.  The symptoms lasted a couple minutes and then completely resolved.  Denies any associated headache, vision change, facial droop.  Patient has never had the symptoms before.  Patient reveals that he was recently diagnosed with "high hemoglobin" and is supposed to be taking a new medication that he has not started.  Denies any recent fever or illness, did go to full dialysis session this morning.  Past Medical History:  Diagnosis Date  . Atrial arrhythmia    atrial tachycardia with variable AV conduction versus atypical aflutter 01/10/19, rate control (02/06/19)  . Cataract   . Dyspnea    on exertion  . ESRD (end stage renal disease) ([place])    Hemo- MWF, Polycystic kidney disease  . Fatigue   . History of kidney stones    removal of stone- cysto  . Hyperlipidemia   . Hyperparathyroidism, secondary renal ([place])   . Hypertension   . Hypoxemia 12/12/2013  . Nonischemic cardiomyopathy ([place])    Er 25% 2015, 55 % 2013  . OSA on CPAP    no longer using cpap  . OSA on CPAP 03/24/2014  . Pneumonia    2015ish  . Prostate cancer ([place])   . Ventricular tachycardia//Freq PVCs   . Wears glasses     Patient Active Problem List    Diagnosis Date Noted  . Malignant neoplasm of prostate ([place]) 03/25/2020  . Leukopenia   . Thrombocytopenia ([place])   . Anemia of chronic disease   . History of ventricular tachycardia   . Anoxic brain injury ([place]) 06/04/2019  . Status post creation of arteriovenous fistula 05/29/2019  . Cardiac arrest ([place]) 05/29/2019  . Unspecified atrial flutter ([place]) 05/21/2019  . Atrial tachycardia ([place]) 02/06/2019  . Pseudoaneurysm of AV hemodialysis fistula, initial encounter ([place]) 04/14/2017  . Status post right inguinal hernia repair-July 2017 11/06/2015  . Hypersomnia with sleep apnea 09/15/2015  . Bradycardia, drug induced 09/15/2015  . OSA on CPAP 03/24/2014  . Hypoxemia 12/12/2013  . Hypersomnia with sleep apnea, unspecified 12/12/2013  . Nonischemic cardiomyopathy ([place])   . VT (ventricular tachycardia) ([place]) 05/13/2013  . PVC's (premature ventricular contractions) 05/24/2012  . Autosomal dominant polycystic kidney disease 02/27/2012  . Pre-transplant evaluation for chronic kidney disease 02/27/2012  . S/P appendectomy 02/27/2012  . End stage renal disease ([place]) 02/27/2012  . Essential hypertension 04/06/2011    Past Surgical History:  Procedure Laterality Date  . A-FLUTTER ABLATION N/A 04/11/2019   Procedure: A-FLUTTER ABLATION;  Surgeon: Evans Lance, MD;  Location: [facility];  Service: Cardiovascular;  Laterality: N/A;  . A/V FISTULAGRAM Left 04/27/2017   Procedure: A/V FISTULAGRAM;  Surgeon: Conrad Bayamon, MD;  Location:  [facility];  Service: Cardiovascular;  Laterality: Left;  lt arm  . A/V FISTULAGRAM Left 01/10/2019   Procedure: A/V FISTULAGRAM;  Surgeon: Marty Heck, MD;  Location: [facility];  Service: Cardiovascular;  Laterality: Left;  . APPENDECTOMY    . AV FISTULA PLACEMENT  12/05/2011   Procedure: ARTERIOVENOUS (AV) FISTULA CREATION;LLEFT ARM  Surgeon: Conrad Blacksburg, MD;  Location: [facility];  Service: Vascular;  Laterality: Left;   RADIO-CEPHALIC  fistula left arm  . AV FISTULA PLACEMENT  01/11/2012   Procedure: ARTERIOVENOUS (AV) FISTULA CREATION;  Surgeon: Conrad Welcome, MD;  Location: [facility];  Service: Vascular;  Laterality: Left;  Creation of left brachial cephalic arteriovenous fistula  . AV FISTULA PLACEMENT Right 03/07/2019   Procedure: ARTERIOVENOUS (AV) FISTULA CREATION  RIGHT ARM;  Surgeon: Marty Heck, MD;  Location: [facility];  Service: Vascular;  Laterality: Right;  . BASCILIC VEIN TRANSPOSITION Left 12/27/2016   Procedure: BASILIC VEIN TRANSPOSITION LEFT UPPER ARM FIRST STAGE;  Surgeon: Conrad Girdletree, MD;  Location: [facility];  Service: Vascular;  Laterality: Left;  . BASCILIC VEIN TRANSPOSITION Left 01/31/2017   Procedure: LEFT ARM BASILIC VEIN TRANSPOSITION, SECOND STAGE;  Surgeon: Conrad Lakin, MD;  Location: [facility];  Service: Vascular;  Laterality: Left;  . CARDIAC CATHETERIZATION  04-05-2010   checking for blockage but none-[facility]  . COLONOSCOPY    . CYSTOSCOPY W/ STONE MANIPULATION     "laser once" (01/22/2013)  . HEMATOMA EVACUATION Left 05/09/2017   Procedure: EVACUATION HEMATOMA LEFT ARM;  Surgeon: Conrad Amherst, MD;  Location: [facility];  Service: Vascular;  Laterality: Left;  . HERNIA REPAIR    . INGUINAL HERNIA REPAIR Right 11/06/2015   Procedure: OPEN HERNIA REPAIR  RIGHT INGUINAL ADULT;  Surgeon: [name], MD;  Location: [facility];  Service: General;  Laterality: Right;  with MESH  . INSERTION OF DIALYSIS CATHETER Right 10/05/2016   Procedure: INSERTION OF right internal jugular DIALYSIS CATHETER;  Surgeon: [name], MD;  Location: [facility];  Service: Vascular;  Laterality: Right;  . INSERTION OF MESH  03/20/2012   Procedure: INSERTION OF MESH;  UMB Surgeon: [name], MD;  Location: [facility];  Service: General;  Laterality: N/A;  . INSERTION OF MESH N/A 01/22/2013   Procedure: INSERTION OF MESH;  Surgeon: [name], MD;  Location: [facility];  Service: General;  Laterality: N/A;  . LAPAROTOMY   04/02/2012   Procedure: EXPLORATORY LAPAROTOMY;  Surgeon: [name], MD;  Location: [facility];  Service: General;  Laterality: N/A;  Exploratory Laparotomy with resection of small intestine  . LEFT HEART CATHETERIZATION WITH CORONARY ANGIOGRAM N/A 05/14/2013   Procedure: LEFT HEART CATHETERIZATION WITH CORONARY ANGIOGRAM;  Surgeon: Sinclair Grooms, MD;  Location: [facility];  Service: Cardiovascular;  Laterality: N/A;  . LIGATION OF ARTERIOVENOUS  FISTULA Left 12/27/2016   Procedure: LIGATION/EXCISION OF LEFT UPPER ARM ARTERIOVENOUS  FISTULA;  EVACUATION OF HEMATOMA;  Surgeon: Conrad [place] Douglas, MD;  Location: [facility];  Service: Vascular;  Laterality: Left;  . LIGATION OF ARTERIOVENOUS  FISTULA Left 03/07/2019   Procedure: LIGATION OF ARTERIOVENOUS FISTULA  LEFT UPPER ARM;  Surgeon: Marty Heck, MD;  Location: [facility];  Service: Vascular;  Laterality: Left;  . REVISON OF ARTERIOVENOUS FISTULA Left 10/05/2016   Procedure: REVISON OF left arm ARTERIOVENOUS FISTULA;  Surgeon: [name], MD;  Location: [facility];  Service: Vascular;  Laterality: Left;  . TONSILLECTOMY    .  UMBILICAL HERNIA REPAIR  03/20/2012   Procedure: HERNIA REPAIR UMBILICAL ADULT;  Surgeon: [name], MD;  Location: [facility];  Service: General;  Laterality: N/A;  . UMBILICAL HERNIA REPAIR  01/22/2013   preperitoneal open procedure due to significant adhesions/notes 01/22/2013  . VENTRAL HERNIA REPAIR N/A 01/22/2013   Procedure: ATTEMPTED LAPAROSCOPIC VENTRAL HERNIA CONVERTED TO OPEN;  Surgeon: [name], MD;  [facility];  Service: General;  Laterality: N/A;       Family History  Problem Relation Age of Onset  . Heart disease Mother   . Hyperlipidemia Mother   . Hypertension Mother   . Kidney disease Father   . Stroke Father   . Kidney disease Brother   . Amblyopia Neg Hx   . Blindness Neg Hx   . Cataracts Neg Hx   . Diabetes Neg Hx   . Glaucoma Neg Hx   . Macular degeneration Neg Hx   . Retinal  detachment Neg Hx   . Strabismus Neg Hx   . Retinitis pigmentosa Neg Hx   . Breast cancer Neg Hx   . Prostate cancer Neg Hx   . Colon cancer Neg Hx   . Pancreatic cancer Neg Hx     Social History   Tobacco Use  . Smoking status: Never Smoker  . Smokeless tobacco: Never Used  Vaping Use  . Vaping Use: Never used  Substance Use Topics  . Alcohol use: No    Alcohol/week: 0.0 standard drinks  . Drug use: No    Home Medications Prior to Admission medications   Medication Sig Start Date End Date Taking? Authorizing Provider  acyclovir (ZOVIRAX) 400 MG tablet Take 1 tablet (400 mg total) by mouth daily with lunch. Patient not taking: No sig reported 12/03/19   [name], MD  apixaban (ELIQUIS) 5 MG TABS tablet Take 1 tablet (5 mg total) by mouth 2 (two) times daily. Patient not taking: Reported on 08/17/2020 02/21/20   Kathyrn Drown D, NP  aspirin EC 81 MG EC tablet Take 1 tablet (81 mg total) by mouth daily. 05/19/13   Lendon Colonel, NP  atorvastatin (LIPITOR) 40 MG tablet Take 40 mg by mouth at bedtime.     [provider]  nitroGLYCERIN (NITROSTAT) 0.4 MG SL tablet Place 1 tablet (0.4 mg total) under the tongue every 5 (five) minutes x 3 doses as needed for chest pain. Patient not taking: No sig reported 05/19/13   Lendon Colonel, NP  prednisoLONE acetate (PRED FORTE) 1 % ophthalmic suspension Place 1 drop into the right eye daily at 12 noon.     [provider]  sevelamer carbonate (RENVELA) 800 MG tablet Take 2 tablets (1,600 mg total) by mouth 3 (three) times daily with meals. 06/11/19   Bary Leriche, PA-C    Allergies    Patient has no known allergies.  Review of Systems   Review of Systems  Constitutional: Negative for chills and fever.  HENT: Negative for congestion.   Eyes: Negative for visual disturbance.  Respiratory: Negative for shortness of breath.   Cardiovascular: Negative for chest pain.  Gastrointestinal: Negative for abdominal  pain, diarrhea and vomiting.  Genitourinary: Negative for dysuria.  Skin: Negative for rash.  Neurological: Positive for facial asymmetry, speech difficulty and weakness. Negative for dizziness, light-headedness, numbness and headaches.    Physical Exam Updated Vital Signs BP 110/77 (BP Location: Right Arm)   Pulse 86   Temp 99.7 F (37.6 C) (Oral)   Resp  20   Ht 5\' 6"  (1.676 m)   Wt 85.3 kg   SpO2 100%   BMI 30.34 kg/m   Physical Exam Vitals and nursing note reviewed.  Constitutional:      General: He is not in acute distress.    Appearance: Normal appearance.  HENT:     Head: Normocephalic.     Mouth/Throat:     Mouth: Mucous membranes are moist.  Cardiovascular:     Rate and Rhythm: Normal rate.  Pulmonary:     Effort: Pulmonary effort is normal. No respiratory distress.  Abdominal:     Palpations: Abdomen is soft.     Tenderness: There is no abdominal tenderness.  Skin:    General: Skin is warm.  Neurological:     General: No focal deficit present.     Mental Status: He is alert and oriented to person, place, and time. Mental status is at baseline.     Cranial Nerves: No cranial nerve deficit.     Motor: No weakness.  Psychiatric:        Mood and Affect: Mood normal.     ED Results / Procedures / Treatments   Labs (all labs ordered are listed, but only abnormal results are displayed) Labs Reviewed  CBC WITH DIFFERENTIAL/PLATELET - Abnormal; Notable for the following components:      Result Value   WBC 2.3 (*)    Hemoglobin 17.5 (*)    HCT 54.4 (*)    RDW 16.6 (*)    All other components within normal limits  COMPREHENSIVE METABOLIC PANEL  PROTIME-INR    EKG EKG Interpretation  Date/Time:  Wednesday August 19 2020 17:09:51 EDT Ventricular Rate:  80 PR Interval:    QRS Duration: 166 QT Interval:  453 QTC Calculation: 523 R Axis:   256 Text Interpretation: Atrial fibrillation Nonspecific IVCD with LAD Inferior infarct, old Anterior infarct, old  Confirmed by Lavenia Atlas [phone]) on 08/19/2020 5:12:47 PM   Radiology No results found.  Procedures Procedures   Medications Ordered in ED Medications - No data to display  ED Course  I have reviewed the triage vital signs and the nursing notes.  Pertinent labs & imaging results that were available during my care of the patient were reviewed by me and considered in my medical decision making (see chart for details).    MDM Rules/Calculators/A&P                          65 year old male presents the emergency department after an episode of aphasia and right hand weakness.  The symptoms are resolved on arrival, NIH is 0, vitals are stable.  EKG shows atrial fibrillation which is baseline for the patient, he states has been compliant with his Eliquis, compliant with dialysis.  Blood work shows an elevated hemoglobin at 17, baseline kidney dysfunction.  Head CT shows no acute finding.  Consulted with neurology, Dr. Malen Gauze, he agrees with admission for TIA observation and further work-up.  Also concerned that there could be a polycthemia component that could have predisposed him to TIA symptoms.  Vitals have remained stable, neuro exam is unchanged.  Patients evaluation and results requires admission for further treatment and care. Patient agrees with admission plan, offers no new complaints and is stable/unchanged at time of admit.  The patient's bed was ready and assigned to the transfer team was called and this is when the patient decided to leave [facility].  He states  that he is uncomfortable and wants to leave.  I had a long discussion with the patient and his spouse at bedside about his findings today and what our concern was in our plan.  He understands that we believe he had a TIA, this is warning for potential stroke.  I have recommended that the patient stays in the department for the completion of their workup however they decline.  I have expressed the importance of  staying including the risks of leaving which include worsening condition, permanent disability and death.  Patient accepts these risks.  They are alert and oriented, they have capacity to make this decision.  I have not been able to convince the patient to stay and they understand the risks of leaving [facility].   [facility] has been notified.  Final Clinical Impression(s) / ED Diagnoses Final diagnoses:  None    Rx / DC Orders ED Discharge Orders    None       Aldin Drees, Alvin Critchley, DO 08/19/20 2038    Lorelle Gibbs, DO 08/19/20 2221

## 2020-08-19 NOTE — ED Notes (Signed)
Doctor K. Horton just informed me that the patient has just informed her that the has decided not to stay.  I advised [facility] was in route to come and take him to [facility].  Called [facility] back to advise patient is adament that he is not staying

## 2020-08-19 NOTE — ED Notes (Signed)
Patient transported to CT 

## 2020-08-19 NOTE — ED Notes (Signed)
Pt left AMA - pt states he would meet his PCP and do the MRI as outpatient - EDP explained to him the health risk  - pt verbalized the understanding - pt is alert and oriented - ambulatory - ptsigned AMA consent  -   [facility] was informed about the transpot is being cancelled.

## 2020-08-19 NOTE — Discharge Instructions (Addendum)
You have chosen to leave the emergency department [place].  I have explained to you your testing results and the need for further evaluation/admission.  We believe you suffered a TIA today which is a warning sign for possible stroke.  It is essential that you be further evaluated with brain MRI, echo and a neurology team, this is why we have advised admission.  You have verbalized understanding of these results and plan and are choosing to leave the hospital [place]. You understand the risks associated with leaving including worsening condition, permanent disability and death without further evaluation/treatment. If you have any worsening symptoms or choose to be treated please return immediately to emergency department.

## 2020-08-21 DIAGNOSIS — Z992 Dependence on renal dialysis: Secondary | ICD-10-CM | POA: Diagnosis not present

## 2020-08-21 DIAGNOSIS — D75839 Thrombocytosis, unspecified: Secondary | ICD-10-CM | POA: Diagnosis not present

## 2020-08-21 DIAGNOSIS — N2581 Secondary hyperparathyroidism of renal origin: Secondary | ICD-10-CM | POA: Diagnosis not present

## 2020-08-21 DIAGNOSIS — N186 End stage renal disease: Secondary | ICD-10-CM | POA: Diagnosis not present

## 2020-08-24 DIAGNOSIS — D75839 Thrombocytosis, unspecified: Secondary | ICD-10-CM | POA: Diagnosis not present

## 2020-08-24 DIAGNOSIS — Z992 Dependence on renal dialysis: Secondary | ICD-10-CM | POA: Diagnosis not present

## 2020-08-24 DIAGNOSIS — N186 End stage renal disease: Secondary | ICD-10-CM | POA: Diagnosis not present

## 2020-08-24 DIAGNOSIS — N2581 Secondary hyperparathyroidism of renal origin: Secondary | ICD-10-CM | POA: Diagnosis not present

## 2020-08-26 ENCOUNTER — Ambulatory Visit
Admission: RE | Admit: 2020-08-26 | Discharge: 2020-08-26 | Disposition: A | Discharge status: Home / Self Care | Payer: Medicare Other | Source: Ambulatory Visit | Attending: Urology | Admitting: Urology

## 2020-08-26 DIAGNOSIS — N186 End stage renal disease: Secondary | ICD-10-CM | POA: Diagnosis not present

## 2020-08-26 DIAGNOSIS — D75839 Thrombocytosis, unspecified: Secondary | ICD-10-CM | POA: Diagnosis not present

## 2020-08-26 DIAGNOSIS — C61 Malignant neoplasm of prostate: Secondary | ICD-10-CM

## 2020-08-26 DIAGNOSIS — N2581 Secondary hyperparathyroidism of renal origin: Secondary | ICD-10-CM | POA: Diagnosis not present

## 2020-08-26 DIAGNOSIS — Z992 Dependence on renal dialysis: Secondary | ICD-10-CM | POA: Diagnosis not present

## 2020-08-26 NOTE — Progress Notes (Signed)
  Radiation Oncology         [phone]) [phone] ________________________________  Name: Michael [name]. [name]: [id number]  Date: 07/17/2020  DOB: 03/29/1956  End of Treatment Note  Diagnosis:   65 y.o.gentleman with Stage T1cadenocarcinoma of the prostate with a Gleason score of 4+3and a PSA of 7.87.      Indication for treatment:  Curative, Definitive Radiotherapy       Radiation treatment dates:   06/09/20 -07/17/20  Site/dose:   The prostate was treated to 70 Gy in 28 fractions of 2.5 Gy  Beams/energy:   The patient was treated with IMRT using volumetric arc therapy delivering 6 MV X-rays to clockwise and counterclockwise circumferential arcs with a 90 degree collimator offset to avoid dose scalloping.  Image guidance was performed with daily cone beam CT prior to each fraction to align to gold markers in the prostate and assure proper bladder and rectal fill volumes.  Immobilization was achieved with Body[employer] custom mold.  Narrative: The patient tolerated radiation treatment relatively well with only minor urinary irritation and modest fatigue.  He did report mild dysuria, weak stream, increased frequency and hesitancy.  He remained on dialysis throughout treatment.  He did develop some diarrhea which was managed with Imodium as needed.  Plan: The patient has completed radiation treatment. He will return to radiation oncology clinic for routine followup in one month. I advised him to call or return sooner if he has any questions or concerns related to his recovery or treatment. ________________________________  Sheral Apley. Tammi Klippel, M.D.

## 2020-08-26 NOTE — Progress Notes (Signed)
Radiation Oncology         [phone]) [phone] ________________________________  Name: Michael [name]. [name]: [id number]  Date: 08/26/2020  DOB: 1955/07/11  Post Treatment Note  CC: [name], MD  Davis Gourd*  Diagnosis:   65 y.o.gentleman with Stage T1cadenocarcinoma of the prostate with a Gleason score of 4+3and a PSA of 7.87.   Interval Since Last Radiation:  5.5 weeks  06/09/20 -07/17/20: The prostate was treated to 70 Gy in 28 fractions of 2.5 Gy  Narrative:  I spoke with the patient to conduct his routine scheduled 1 month follow up visit via telephone to spare the patient unnecessary potential exposure in the healthcare setting during the current COVID-19 pandemic.  The patient was notified in advance and gave permission to proceed with this visit format.  He tolerated radiation treatment relatively well with only minor urinary irritation and modest fatigue.  He did report mild dysuria, weak stream, increased frequency and hesitancy.  He remained on dialysis throughout treatment.  He did develop some diarrhea which was managed with Imodium as needed.                              On review of systems, the patient states that he is doing very well overall and feels like he is pretty much back to his baseline at this point.  He specifically denies any dysuria, gross hematuria excessive daytime frequency, urgency, straining to void, incomplete emptying or incontinence.  He continues with mild hesitancy and a weaker flow of stream but only produces a small amount of urine daily secondary to his dialysis treatments.  He denies any significant fatigue and has remained active.  He reports that his bowels are back to normal and he denies any abdominal pain, nausea, vomiting, diarrhea or constipation.  He had a recent follow-up visit with Dr. Lovena Neighbours on 08/11/2020 and a follow-up PSA at that time showed an excellent response, decreased down to 3.  Overall, he is quite pleased with his  progress to date.   ALLERGIES:  has No Known Allergies.  Meds: Current Outpatient Medications  Medication Sig Dispense Refill  . aspirin EC 81 MG EC tablet Take 1 tablet (81 mg total) by mouth daily.    [name] atorvastatin (LIPITOR) 40 MG tablet Take 40 mg by mouth at bedtime.     . prednisoLONE acetate (PRED FORTE) 1 % ophthalmic suspension Place 1 drop into the right eye daily at 12 noon.     . sevelamer carbonate (RENVELA) 800 MG tablet Take 2 tablets (1,600 mg total) by mouth 3 (three) times daily with meals. 180 tablet 0  . acyclovir (ZOVIRAX) 400 MG tablet Take 1 tablet (400 mg total) by mouth daily with lunch. (Patient not taking: No sig reported) 30 tablet 1  . apixaban (ELIQUIS) 5 MG TABS tablet Take 1 tablet (5 mg total) by mouth 2 (two) times daily. (Patient not taking: Reported on 08/17/2020) 180 tablet 3  . nitroGLYCERIN (NITROSTAT) 0.4 MG SL tablet Place 1 tablet (0.4 mg total) under the tongue every 5 (five) minutes x 3 doses as needed for chest pain. (Patient not taking: No sig reported) 30 tablet 12   No current facility-administered medications for this encounter.    Physical Findings:  vitals were not taken for this visit.   /Unable to assess due to telephone follow-up visit format.  Lab Findings: Lab Results  Component Value Date   WBC 2.3 (  L) 08/19/2020   HGB 17.5 (H) 08/19/2020   HCT 54.4 (H) 08/19/2020   MCV 94.3 08/19/2020   PLT 98 (L) 08/19/2020     Radiographic Findings: CT Head Wo Contrast  Result Date: 08/19/2020 CLINICAL DATA:  Difficulty speaking and lightheadedness this afternoon EXAM: CT HEAD WITHOUT CONTRAST TECHNIQUE: Contiguous axial images were obtained from the base of the skull through the vertex without intravenous contrast. COMPARISON:  05/29/2019 FINDINGS: Brain: No evidence of acute infarction, hemorrhage, hydrocephalus, extra-axial collection or mass lesion/mass effect. Chronic atrophic and ischemic changes are noted. Vascular: No hyperdense  vessel or unexpected calcification. Skull: Normal. Negative for fracture or focal lesion. Sinuses/Orbits: No acute finding. Other: None. IMPRESSION: Chronic atrophic and ischemic changes without acute abnormality. Electronically Signed   By: Inez Catalina M.D.   On: 08/19/2020 18:52    Impression/Plan: 1. 65 y.o.gentleman with Stage T1cadenocarcinoma of the prostate with a Gleason score of 4+3and a PSA of 7.87.  He will continue to follow up with urology for ongoing PSA determinations and had a follow up appointment with Dr. Lovena Neighbours on 08/11/2020.  His PSA showed a good response to treatment, decreased to 3 at that time.  He is scheduled for repeat labs on 11/10/20 and will see Dr. Lovena Neighbours again the following week. He understands what to expect with regards to PSA monitoring going forward. I will look forward to following his response to treatment via correspondence with urology, and would be happy to continue to participate in his care if clinically indicated. I talked to the patient about what to expect in the future, including his risk for erectile dysfunction and rectal bleeding. I encouraged him to call or return to the office if he has any questions regarding his previous radiation or possible radiation side effects. He was comfortable with this plan and will follow up as needed.    Nicholos Johns, PA-C

## 2020-08-28 DIAGNOSIS — D75839 Thrombocytosis, unspecified: Secondary | ICD-10-CM | POA: Diagnosis not present

## 2020-08-28 DIAGNOSIS — N186 End stage renal disease: Secondary | ICD-10-CM | POA: Diagnosis not present

## 2020-08-28 DIAGNOSIS — N2581 Secondary hyperparathyroidism of renal origin: Secondary | ICD-10-CM | POA: Diagnosis not present

## 2020-08-28 DIAGNOSIS — Z992 Dependence on renal dialysis: Secondary | ICD-10-CM | POA: Diagnosis not present

## 2020-08-30 DIAGNOSIS — Q612 Polycystic kidney, adult type: Secondary | ICD-10-CM | POA: Diagnosis not present

## 2020-08-30 DIAGNOSIS — N186 End stage renal disease: Secondary | ICD-10-CM | POA: Diagnosis not present

## 2020-08-30 DIAGNOSIS — Z992 Dependence on renal dialysis: Secondary | ICD-10-CM | POA: Diagnosis not present

## 2020-08-31 DIAGNOSIS — N2581 Secondary hyperparathyroidism of renal origin: Secondary | ICD-10-CM | POA: Diagnosis not present

## 2020-08-31 DIAGNOSIS — N186 End stage renal disease: Secondary | ICD-10-CM | POA: Diagnosis not present

## 2020-08-31 DIAGNOSIS — Z992 Dependence on renal dialysis: Secondary | ICD-10-CM | POA: Diagnosis not present

## 2020-09-02 DIAGNOSIS — N2581 Secondary hyperparathyroidism of renal origin: Secondary | ICD-10-CM | POA: Diagnosis not present

## 2020-09-02 DIAGNOSIS — N186 End stage renal disease: Secondary | ICD-10-CM | POA: Diagnosis not present

## 2020-09-02 DIAGNOSIS — Z992 Dependence on renal dialysis: Secondary | ICD-10-CM | POA: Diagnosis not present

## 2020-09-04 DIAGNOSIS — N186 End stage renal disease: Secondary | ICD-10-CM | POA: Diagnosis not present

## 2020-09-04 DIAGNOSIS — Z992 Dependence on renal dialysis: Secondary | ICD-10-CM | POA: Diagnosis not present

## 2020-09-04 DIAGNOSIS — N2581 Secondary hyperparathyroidism of renal origin: Secondary | ICD-10-CM | POA: Diagnosis not present

## 2020-09-07 DIAGNOSIS — Z992 Dependence on renal dialysis: Secondary | ICD-10-CM | POA: Diagnosis not present

## 2020-09-07 DIAGNOSIS — N2581 Secondary hyperparathyroidism of renal origin: Secondary | ICD-10-CM | POA: Diagnosis not present

## 2020-09-07 DIAGNOSIS — N186 End stage renal disease: Secondary | ICD-10-CM | POA: Diagnosis not present

## 2020-09-09 DIAGNOSIS — N2581 Secondary hyperparathyroidism of renal origin: Secondary | ICD-10-CM | POA: Diagnosis not present

## 2020-09-09 DIAGNOSIS — Z992 Dependence on renal dialysis: Secondary | ICD-10-CM | POA: Diagnosis not present

## 2020-09-09 DIAGNOSIS — N186 End stage renal disease: Secondary | ICD-10-CM | POA: Diagnosis not present

## 2020-09-11 DIAGNOSIS — Z992 Dependence on renal dialysis: Secondary | ICD-10-CM | POA: Diagnosis not present

## 2020-09-11 DIAGNOSIS — N186 End stage renal disease: Secondary | ICD-10-CM | POA: Diagnosis not present

## 2020-09-11 DIAGNOSIS — N2581 Secondary hyperparathyroidism of renal origin: Secondary | ICD-10-CM | POA: Diagnosis not present

## 2020-09-14 DIAGNOSIS — Z992 Dependence on renal dialysis: Secondary | ICD-10-CM | POA: Diagnosis not present

## 2020-09-14 DIAGNOSIS — N2581 Secondary hyperparathyroidism of renal origin: Secondary | ICD-10-CM | POA: Diagnosis not present

## 2020-09-14 DIAGNOSIS — N186 End stage renal disease: Secondary | ICD-10-CM | POA: Diagnosis not present

## 2020-09-16 ENCOUNTER — Other Ambulatory Visit: Payer: Self-pay

## 2020-09-16 ENCOUNTER — Ambulatory Visit (INDEPENDENT_AMBULATORY_CARE_PROVIDER_SITE_OTHER): Payer: Medicare Other | Admitting: Podiatry

## 2020-09-16 DIAGNOSIS — B353 Tinea pedis: Secondary | ICD-10-CM | POA: Diagnosis not present

## 2020-09-16 DIAGNOSIS — Z992 Dependence on renal dialysis: Secondary | ICD-10-CM | POA: Diagnosis not present

## 2020-09-16 DIAGNOSIS — N186 End stage renal disease: Secondary | ICD-10-CM | POA: Diagnosis not present

## 2020-09-16 DIAGNOSIS — N2581 Secondary hyperparathyroidism of renal origin: Secondary | ICD-10-CM | POA: Diagnosis not present

## 2020-09-16 MED ORDER — CLOTRIMAZOLE-BETAMETHASONE 1-0.05 % EX CREA: 1.0000 "application " | TOPICAL_CREAM | Freq: Two times a day (BID) | CUTANEOUS | 2 refills | Status: DC

## 2020-09-16 MED ORDER — TERBINAFINE HCL 250 MG PO TABS: 250.0000 mg | ORAL_TABLET | Freq: Every day | ORAL | 0 refills | Status: DC

## 2020-09-16 NOTE — Progress Notes (Signed)
   HPI: 65 y.o. male presenting today for evaluation of thick skin and itching to the bilateral feet.  He states that is been symptomatic and painful with peeling skin for several years.  He has not done anything for treatment.  He presents for further treatment and evaluation  Past Medical History:  Diagnosis Date  . Atrial arrhythmia    atrial tachycardia with variable AV conduction versus atypical aflutter 01/10/19, rate control (02/06/19)  . Cataract   . Dyspnea    on exertion  . ESRD (end stage renal disease) ([facility])    Hemo- MWF, Polycystic kidney disease  . Fatigue   . History of kidney stones    removal of stone- cysto  . Hyperlipidemia   . Hyperparathyroidism, secondary renal ([place])   . Hypertension   . Hypoxemia 12/12/2013  . Nonischemic cardiomyopathy ([place])    Er 25% 2015, 55 % 2013  . OSA on CPAP    no longer using cpap  . OSA on CPAP 03/24/2014  . Pneumonia    2015ish  . Prostate cancer ([place])   . Ventricular tachycardia//Freq PVCs   . Wears glasses      Physical Exam: General: The patient is alert and oriented x3 in no acute distress.  Dermatology: Skin is warm, dry and supple bilateral lower extremities. Negative for open lesions or macerations. Diffuse hyperkeratosis of skin along the volar surface of the foot.   Vascular: Palpable pedal pulses bilaterally. No edema or erythema noted. Capillary refill within normal limits.  Neurological: Epicritic and protective threshold grossly intact bilaterally.   Musculoskeletal Exam: Range of motion within normal limits to all pedal and ankle joints bilateral. Muscle strength 5/5 in all groups bilateral.   Assessment: 1. Chronic Tinea pedis bilateral   Plan of Care:  1. Patient evaluated.  2. Rx Lamisil 250mg  #30 daily 3. Rx Lotrisone cream apply BID 4. RTC 4 weeks      Edrick Kins, DPM [facility]  Dr. Edrick Kins, DPM    [address], [place] [address]                Office [phone]  Fax [phone]

## 2020-09-18 DIAGNOSIS — N2581 Secondary hyperparathyroidism of renal origin: Secondary | ICD-10-CM | POA: Diagnosis not present

## 2020-09-18 DIAGNOSIS — N186 End stage renal disease: Secondary | ICD-10-CM | POA: Diagnosis not present

## 2020-09-18 DIAGNOSIS — Z992 Dependence on renal dialysis: Secondary | ICD-10-CM | POA: Diagnosis not present

## 2020-09-21 DIAGNOSIS — N186 End stage renal disease: Secondary | ICD-10-CM | POA: Diagnosis not present

## 2020-09-21 DIAGNOSIS — Z992 Dependence on renal dialysis: Secondary | ICD-10-CM | POA: Diagnosis not present

## 2020-09-21 DIAGNOSIS — N2581 Secondary hyperparathyroidism of renal origin: Secondary | ICD-10-CM | POA: Diagnosis not present

## 2020-09-23 DIAGNOSIS — Z992 Dependence on renal dialysis: Secondary | ICD-10-CM | POA: Diagnosis not present

## 2020-09-23 DIAGNOSIS — N186 End stage renal disease: Secondary | ICD-10-CM | POA: Diagnosis not present

## 2020-09-23 DIAGNOSIS — N2581 Secondary hyperparathyroidism of renal origin: Secondary | ICD-10-CM | POA: Diagnosis not present

## 2020-09-25 DIAGNOSIS — Z992 Dependence on renal dialysis: Secondary | ICD-10-CM | POA: Diagnosis not present

## 2020-09-25 DIAGNOSIS — N2581 Secondary hyperparathyroidism of renal origin: Secondary | ICD-10-CM | POA: Diagnosis not present

## 2020-09-25 DIAGNOSIS — N186 End stage renal disease: Secondary | ICD-10-CM | POA: Diagnosis not present

## 2020-09-28 DIAGNOSIS — N2581 Secondary hyperparathyroidism of renal origin: Secondary | ICD-10-CM | POA: Diagnosis not present

## 2020-09-28 DIAGNOSIS — Z992 Dependence on renal dialysis: Secondary | ICD-10-CM | POA: Diagnosis not present

## 2020-09-28 DIAGNOSIS — N186 End stage renal disease: Secondary | ICD-10-CM | POA: Diagnosis not present

## 2020-09-30 DIAGNOSIS — Q612 Polycystic kidney, adult type: Secondary | ICD-10-CM | POA: Diagnosis not present

## 2020-09-30 DIAGNOSIS — N186 End stage renal disease: Secondary | ICD-10-CM | POA: Diagnosis not present

## 2020-09-30 DIAGNOSIS — Z992 Dependence on renal dialysis: Secondary | ICD-10-CM | POA: Diagnosis not present

## 2020-09-30 DIAGNOSIS — N2581 Secondary hyperparathyroidism of renal origin: Secondary | ICD-10-CM | POA: Diagnosis not present

## 2020-10-02 DIAGNOSIS — N2581 Secondary hyperparathyroidism of renal origin: Secondary | ICD-10-CM | POA: Diagnosis not present

## 2020-10-02 DIAGNOSIS — N186 End stage renal disease: Secondary | ICD-10-CM | POA: Diagnosis not present

## 2020-10-02 DIAGNOSIS — Z992 Dependence on renal dialysis: Secondary | ICD-10-CM | POA: Diagnosis not present

## 2020-10-05 DIAGNOSIS — N186 End stage renal disease: Secondary | ICD-10-CM | POA: Diagnosis not present

## 2020-10-05 DIAGNOSIS — N2581 Secondary hyperparathyroidism of renal origin: Secondary | ICD-10-CM | POA: Diagnosis not present

## 2020-10-05 DIAGNOSIS — Z992 Dependence on renal dialysis: Secondary | ICD-10-CM | POA: Diagnosis not present

## 2020-10-07 DIAGNOSIS — Z992 Dependence on renal dialysis: Secondary | ICD-10-CM | POA: Diagnosis not present

## 2020-10-07 DIAGNOSIS — N186 End stage renal disease: Secondary | ICD-10-CM | POA: Diagnosis not present

## 2020-10-07 DIAGNOSIS — N2581 Secondary hyperparathyroidism of renal origin: Secondary | ICD-10-CM | POA: Diagnosis not present

## 2020-10-09 DIAGNOSIS — N186 End stage renal disease: Secondary | ICD-10-CM | POA: Diagnosis not present

## 2020-10-09 DIAGNOSIS — Z992 Dependence on renal dialysis: Secondary | ICD-10-CM | POA: Diagnosis not present

## 2020-10-09 DIAGNOSIS — N2581 Secondary hyperparathyroidism of renal origin: Secondary | ICD-10-CM | POA: Diagnosis not present

## 2020-10-11 ENCOUNTER — Other Ambulatory Visit: Payer: Self-pay | Admitting: Podiatry

## 2020-10-12 DIAGNOSIS — N186 End stage renal disease: Secondary | ICD-10-CM | POA: Diagnosis not present

## 2020-10-12 DIAGNOSIS — Z992 Dependence on renal dialysis: Secondary | ICD-10-CM | POA: Diagnosis not present

## 2020-10-12 DIAGNOSIS — N2581 Secondary hyperparathyroidism of renal origin: Secondary | ICD-10-CM | POA: Diagnosis not present

## 2020-10-14 DIAGNOSIS — N186 End stage renal disease: Secondary | ICD-10-CM | POA: Diagnosis not present

## 2020-10-14 DIAGNOSIS — N2581 Secondary hyperparathyroidism of renal origin: Secondary | ICD-10-CM | POA: Diagnosis not present

## 2020-10-14 DIAGNOSIS — Z992 Dependence on renal dialysis: Secondary | ICD-10-CM | POA: Diagnosis not present

## 2020-10-19 ENCOUNTER — Other Ambulatory Visit: Payer: Self-pay

## 2020-10-19 ENCOUNTER — Ambulatory Visit (INDEPENDENT_AMBULATORY_CARE_PROVIDER_SITE_OTHER): Payer: Medicare Other | Admitting: Podiatry

## 2020-10-19 DIAGNOSIS — B353 Tinea pedis: Secondary | ICD-10-CM | POA: Diagnosis not present

## 2020-10-19 DIAGNOSIS — N2581 Secondary hyperparathyroidism of renal origin: Secondary | ICD-10-CM | POA: Diagnosis not present

## 2020-10-19 DIAGNOSIS — M79671 Pain in right foot: Secondary | ICD-10-CM

## 2020-10-19 DIAGNOSIS — M79672 Pain in left foot: Secondary | ICD-10-CM

## 2020-10-19 DIAGNOSIS — L989 Disorder of the skin and subcutaneous tissue, unspecified: Secondary | ICD-10-CM

## 2020-10-19 DIAGNOSIS — Z992 Dependence on renal dialysis: Secondary | ICD-10-CM | POA: Diagnosis not present

## 2020-10-19 DIAGNOSIS — N186 End stage renal disease: Secondary | ICD-10-CM | POA: Diagnosis not present

## 2020-10-19 NOTE — Progress Notes (Signed)
   HPI: 65 y.o. male presenting today for follow-up evaluation of thick skin and itching to the bilateral feet.  Patient states that he no longer experiences the itching to the bilateral feet.  He continues to have pain and tenderness associated to the feet.  They are very sensitive.  He presents for further treatment and evaluation  Past Medical History:  Diagnosis Date   Atrial arrhythmia    atrial tachycardia with variable AV conduction versus atypical aflutter 01/10/19, rate control (02/06/19)   Cataract    Dyspnea    on exertion   ESRD (end stage renal disease) ([place])    Hemo- MWF, Polycystic kidney disease   Fatigue    History of kidney stones    removal of stone- cysto   Hyperlipidemia    Hyperparathyroidism, secondary renal ([place])    Hypertension    Hypoxemia 12/12/2013   Nonischemic cardiomyopathy ([place])    Er 25% 2015, 55 % 2013   OSA on CPAP    no longer using cpap   OSA on CPAP 03/24/2014   Pneumonia    2015ish   Prostate cancer [facility])    Ventricular tachycardia//Freq PVCs    Wears glasses      Physical Exam: General: The patient is alert and oriented x3 in no acute distress.  Dermatology: Skin is warm, dry and supple bilateral lower extremities. Negative for open lesions or macerations. Diffuse hyperkeratosis of skin along the volar surface of the foot with focal areas of porokeratosis/keratoderma.   Vascular: Palpable pedal pulses bilaterally. No edema or erythema noted. Capillary refill within normal limits.  Neurological: Epicritic and protective threshold grossly intact bilaterally.   Musculoskeletal Exam: Range of motion within normal limits to all pedal and ankle joints bilateral. Muscle strength 5/5 in all groups bilateral.  Associated tenderness to palpation noted bilateral feet to the porokeratosis lesions  Assessment: 1. Chronic Tinea pedis bilateral 2.  Multiple porokeratosis bilateral feet   Plan of Care:  1. Patient evaluated.  Excisional debridement  of the hyperkeratotic callus lesions was performed using a tissue nipper without incident or bleeding to the bilateral feet 2.  Patient completed the oral Lamisil turned 50 mg #30 as prescribed 3.  Patient continues to apply the Lotrisone cream 2 times daily 4.  Salicylic acid provided for the patient to apply daily to the most symptomatic porokeratosis lesions 5.  Return to clinic in 1 month     Edrick Kins, DPM [facility]  Dr. Edrick Kins, DPM    [address], [place] [address]                Office [phone]  Fax [phone]

## 2020-10-21 ENCOUNTER — Ambulatory Visit: Payer: Medicare Other | Admitting: Podiatry

## 2020-10-21 DIAGNOSIS — N2581 Secondary hyperparathyroidism of renal origin: Secondary | ICD-10-CM | POA: Diagnosis not present

## 2020-10-21 DIAGNOSIS — Z992 Dependence on renal dialysis: Secondary | ICD-10-CM | POA: Diagnosis not present

## 2020-10-21 DIAGNOSIS — N186 End stage renal disease: Secondary | ICD-10-CM | POA: Diagnosis not present

## 2020-10-23 DIAGNOSIS — N186 End stage renal disease: Secondary | ICD-10-CM | POA: Diagnosis not present

## 2020-10-23 DIAGNOSIS — N2581 Secondary hyperparathyroidism of renal origin: Secondary | ICD-10-CM | POA: Diagnosis not present

## 2020-10-23 DIAGNOSIS — Z992 Dependence on renal dialysis: Secondary | ICD-10-CM | POA: Diagnosis not present

## 2020-10-26 DIAGNOSIS — Z992 Dependence on renal dialysis: Secondary | ICD-10-CM | POA: Diagnosis not present

## 2020-10-26 DIAGNOSIS — N186 End stage renal disease: Secondary | ICD-10-CM | POA: Diagnosis not present

## 2020-10-26 DIAGNOSIS — N2581 Secondary hyperparathyroidism of renal origin: Secondary | ICD-10-CM | POA: Diagnosis not present

## 2020-10-28 DIAGNOSIS — N2581 Secondary hyperparathyroidism of renal origin: Secondary | ICD-10-CM | POA: Diagnosis not present

## 2020-10-28 DIAGNOSIS — Z992 Dependence on renal dialysis: Secondary | ICD-10-CM | POA: Diagnosis not present

## 2020-10-28 DIAGNOSIS — N186 End stage renal disease: Secondary | ICD-10-CM | POA: Diagnosis not present

## 2020-10-30 DIAGNOSIS — Z992 Dependence on renal dialysis: Secondary | ICD-10-CM | POA: Diagnosis not present

## 2020-10-30 DIAGNOSIS — N186 End stage renal disease: Secondary | ICD-10-CM | POA: Diagnosis not present

## 2020-10-30 DIAGNOSIS — N2581 Secondary hyperparathyroidism of renal origin: Secondary | ICD-10-CM | POA: Diagnosis not present

## 2020-10-30 DIAGNOSIS — Q612 Polycystic kidney, adult type: Secondary | ICD-10-CM | POA: Diagnosis not present

## 2020-11-02 DIAGNOSIS — N186 End stage renal disease: Secondary | ICD-10-CM | POA: Diagnosis not present

## 2020-11-02 DIAGNOSIS — N2581 Secondary hyperparathyroidism of renal origin: Secondary | ICD-10-CM | POA: Diagnosis not present

## 2020-11-02 DIAGNOSIS — Z992 Dependence on renal dialysis: Secondary | ICD-10-CM | POA: Diagnosis not present

## 2020-11-06 DIAGNOSIS — N2581 Secondary hyperparathyroidism of renal origin: Secondary | ICD-10-CM | POA: Diagnosis not present

## 2020-11-06 DIAGNOSIS — N186 End stage renal disease: Secondary | ICD-10-CM | POA: Diagnosis not present

## 2020-11-06 DIAGNOSIS — Z992 Dependence on renal dialysis: Secondary | ICD-10-CM | POA: Diagnosis not present

## 2020-11-09 DIAGNOSIS — N186 End stage renal disease: Secondary | ICD-10-CM | POA: Diagnosis not present

## 2020-11-09 DIAGNOSIS — N2581 Secondary hyperparathyroidism of renal origin: Secondary | ICD-10-CM | POA: Diagnosis not present

## 2020-11-09 DIAGNOSIS — Z992 Dependence on renal dialysis: Secondary | ICD-10-CM | POA: Diagnosis not present

## 2020-11-10 DIAGNOSIS — C61 Malignant neoplasm of prostate: Secondary | ICD-10-CM | POA: Diagnosis not present

## 2020-11-11 DIAGNOSIS — N2581 Secondary hyperparathyroidism of renal origin: Secondary | ICD-10-CM | POA: Diagnosis not present

## 2020-11-11 DIAGNOSIS — N186 End stage renal disease: Secondary | ICD-10-CM | POA: Diagnosis not present

## 2020-11-11 DIAGNOSIS — Z992 Dependence on renal dialysis: Secondary | ICD-10-CM | POA: Diagnosis not present

## 2020-11-13 DIAGNOSIS — N186 End stage renal disease: Secondary | ICD-10-CM | POA: Diagnosis not present

## 2020-11-13 DIAGNOSIS — N2581 Secondary hyperparathyroidism of renal origin: Secondary | ICD-10-CM | POA: Diagnosis not present

## 2020-11-13 DIAGNOSIS — Z992 Dependence on renal dialysis: Secondary | ICD-10-CM | POA: Diagnosis not present

## 2020-11-16 DIAGNOSIS — Z992 Dependence on renal dialysis: Secondary | ICD-10-CM | POA: Diagnosis not present

## 2020-11-16 DIAGNOSIS — N186 End stage renal disease: Secondary | ICD-10-CM | POA: Diagnosis not present

## 2020-11-16 DIAGNOSIS — N2581 Secondary hyperparathyroidism of renal origin: Secondary | ICD-10-CM | POA: Diagnosis not present

## 2020-11-17 ENCOUNTER — Emergency Department (HOSPITAL_COMMUNITY)
Admission: EM | Admit: 2020-11-17 | Discharge: 2020-11-17 | Disposition: A | Discharge status: Home / Self Care | Payer: Medicare Other | Attending: Emergency Medicine | Admitting: Emergency Medicine

## 2020-11-17 ENCOUNTER — Emergency Department (HOSPITAL_COMMUNITY): Payer: Medicare Other

## 2020-11-17 ENCOUNTER — Other Ambulatory Visit: Payer: Self-pay

## 2020-11-17 ENCOUNTER — Encounter (HOSPITAL_COMMUNITY): Payer: Self-pay

## 2020-11-17 DIAGNOSIS — Z7982 Long term (current) use of aspirin: Secondary | ICD-10-CM | POA: Diagnosis not present

## 2020-11-17 DIAGNOSIS — Z79899 Other long term (current) drug therapy: Secondary | ICD-10-CM | POA: Diagnosis not present

## 2020-11-17 DIAGNOSIS — J40 Bronchitis, not specified as acute or chronic: Secondary | ICD-10-CM | POA: Insufficient documentation

## 2020-11-17 DIAGNOSIS — N186 End stage renal disease: Secondary | ICD-10-CM | POA: Insufficient documentation

## 2020-11-17 DIAGNOSIS — R609 Edema, unspecified: Secondary | ICD-10-CM

## 2020-11-17 DIAGNOSIS — B349 Viral infection, unspecified: Secondary | ICD-10-CM | POA: Diagnosis not present

## 2020-11-17 DIAGNOSIS — Z20822 Contact with and (suspected) exposure to covid-19: Secondary | ICD-10-CM | POA: Diagnosis not present

## 2020-11-17 DIAGNOSIS — R0602 Shortness of breath: Secondary | ICD-10-CM | POA: Diagnosis not present

## 2020-11-17 DIAGNOSIS — I120 Hypertensive chronic kidney disease with stage 5 chronic kidney disease or end stage renal disease: Secondary | ICD-10-CM | POA: Diagnosis not present

## 2020-11-17 DIAGNOSIS — Z992 Dependence on renal dialysis: Secondary | ICD-10-CM | POA: Diagnosis not present

## 2020-11-17 DIAGNOSIS — R2243 Localized swelling, mass and lump, lower limb, bilateral: Secondary | ICD-10-CM | POA: Insufficient documentation

## 2020-11-17 DIAGNOSIS — Z8546 Personal history of malignant neoplasm of prostate: Secondary | ICD-10-CM | POA: Diagnosis not present

## 2020-11-17 DIAGNOSIS — R059 Cough, unspecified: Secondary | ICD-10-CM | POA: Diagnosis not present

## 2020-11-17 DIAGNOSIS — I517 Cardiomegaly: Secondary | ICD-10-CM | POA: Diagnosis not present

## 2020-11-17 DIAGNOSIS — Z7901 Long term (current) use of anticoagulants: Secondary | ICD-10-CM | POA: Diagnosis not present

## 2020-11-17 DIAGNOSIS — J90 Pleural effusion, not elsewhere classified: Secondary | ICD-10-CM | POA: Diagnosis not present

## 2020-11-17 DIAGNOSIS — R531 Weakness: Secondary | ICD-10-CM | POA: Diagnosis not present

## 2020-11-17 DIAGNOSIS — I509 Heart failure, unspecified: Secondary | ICD-10-CM | POA: Diagnosis not present

## 2020-11-17 DIAGNOSIS — J189 Pneumonia, unspecified organism: Secondary | ICD-10-CM | POA: Diagnosis not present

## 2020-11-17 DIAGNOSIS — R600 Localized edema: Secondary | ICD-10-CM | POA: Diagnosis not present

## 2020-11-17 DIAGNOSIS — J4541 Moderate persistent asthma with (acute) exacerbation: Secondary | ICD-10-CM | POA: Diagnosis not present

## 2020-11-17 LAB — BASIC METABOLIC PANEL
Anion gap: 11 (ref 5–15)
BUN: 25 mg/dL — ABNORMAL HIGH (ref 8–23)
CO2: 30 mmol/L (ref 22–32)
Calcium: 8.5 mg/dL — ABNORMAL LOW (ref 8.9–10.3)
Chloride: 99 mmol/L (ref 98–111)
Creatinine, Ser: 8.59 mg/dL — ABNORMAL HIGH (ref 0.61–1.24)
GFR, Estimated: 6 mL/min — ABNORMAL LOW (ref >60)
Glucose, Bld: 60 mg/dL — ABNORMAL LOW (ref 70–99)
Potassium: 3.6 mmol/L (ref 3.5–5.1)
Sodium: 140 mmol/L (ref 135–145)

## 2020-11-17 LAB — CBC WITH DIFFERENTIAL/PLATELET
Abs Immature Granulocytes: 0.04 10*3/uL (ref 0.00–0.07)
Basophils Absolute: 0 10*3/uL (ref 0.0–0.1)
Basophils Relative: 1 %
Eosinophils Absolute: 0.1 10*3/uL (ref 0.0–0.5)
Eosinophils Relative: 2 %
HCT: 51.3 % (ref 39.0–52.0)
Hemoglobin: 16 g/dL (ref 13.0–17.0)
Immature Granulocytes: 1 %
Lymphocytes Relative: 8 %
Lymphs Abs: 0.4 10*3/uL — ABNORMAL LOW (ref 0.7–4.0)
MCH: 29.9 pg (ref 26.0–34.0)
MCHC: 31.2 g/dL (ref 30.0–36.0)
MCV: 95.7 fL (ref 80.0–100.0)
Monocytes Absolute: 0.4 10*3/uL (ref 0.1–1.0)
Monocytes Relative: 10 %
Neutro Abs: 3.6 10*3/uL (ref 1.7–7.7)
Neutrophils Relative %: 78 %
Platelets: 189 10*3/uL (ref 150–400)
RBC: 5.36 MIL/uL (ref 4.22–5.81)
RDW: 14.3 % (ref 11.5–15.5)
WBC: 4.5 10*3/uL (ref 4.0–10.5)
nRBC: 0 % (ref 0.0–0.2)

## 2020-11-17 LAB — RESP PANEL BY RT-PCR (FLU A&B, COVID) ARPGX2
Influenza A by PCR: NEGATIVE
Influenza B by PCR: NEGATIVE
SARS Coronavirus 2 by RT PCR: NEGATIVE

## 2020-11-17 LAB — BRAIN NATRIURETIC PEPTIDE: B Natriuretic Peptide: 2991.4 pg/mL — ABNORMAL HIGH (ref 0.0–100.0)

## 2020-11-17 IMAGING — CR DG CHEST 2V
2 series · 2 of 2 positions shown · non-contrast
Comparison: Chest x-ray from same day.

CLINICAL DATA: Cough, shortness of breath, and weakness for several
weeks.

EXAM:
CHEST - 2 VIEW

[w chest pa]
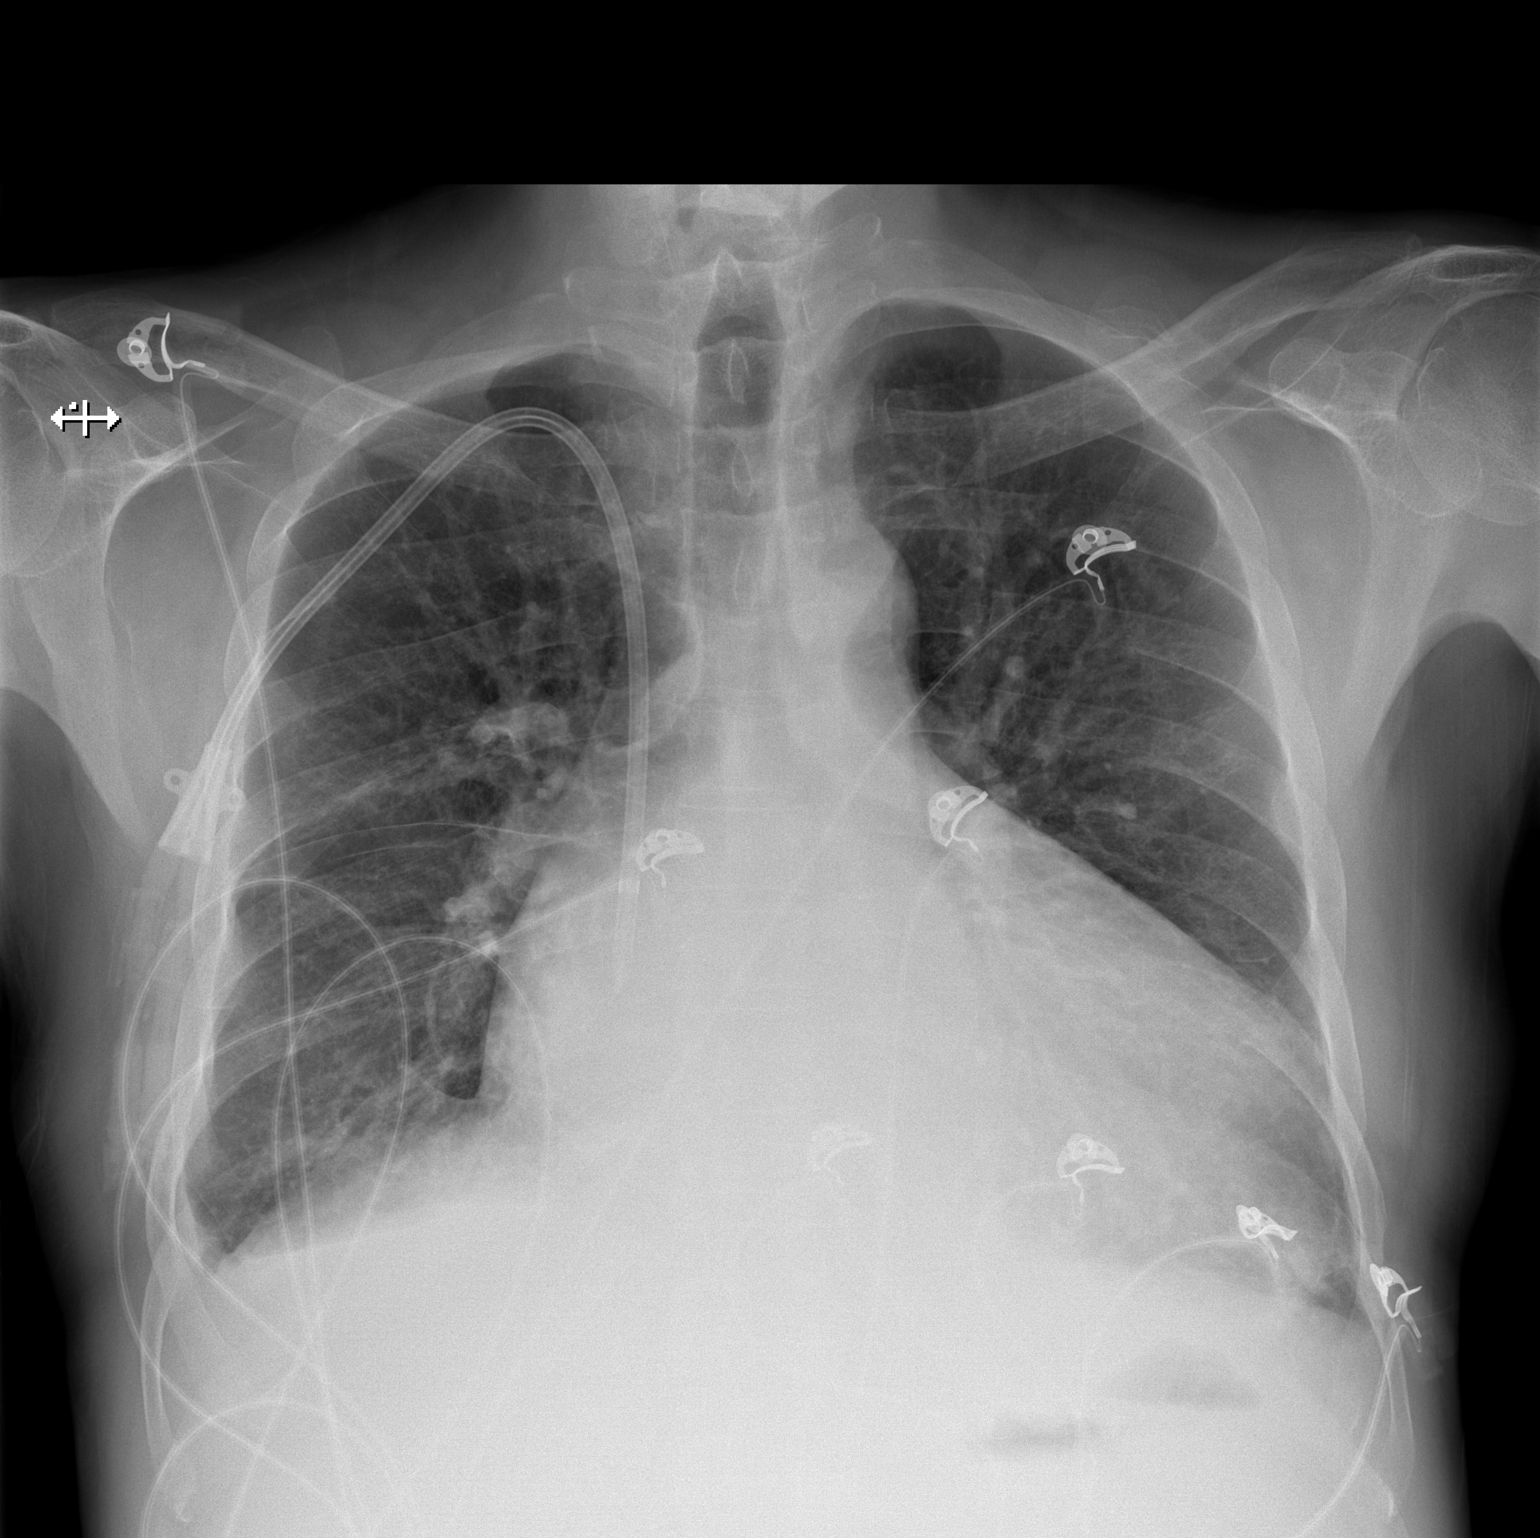

[w chest lat]
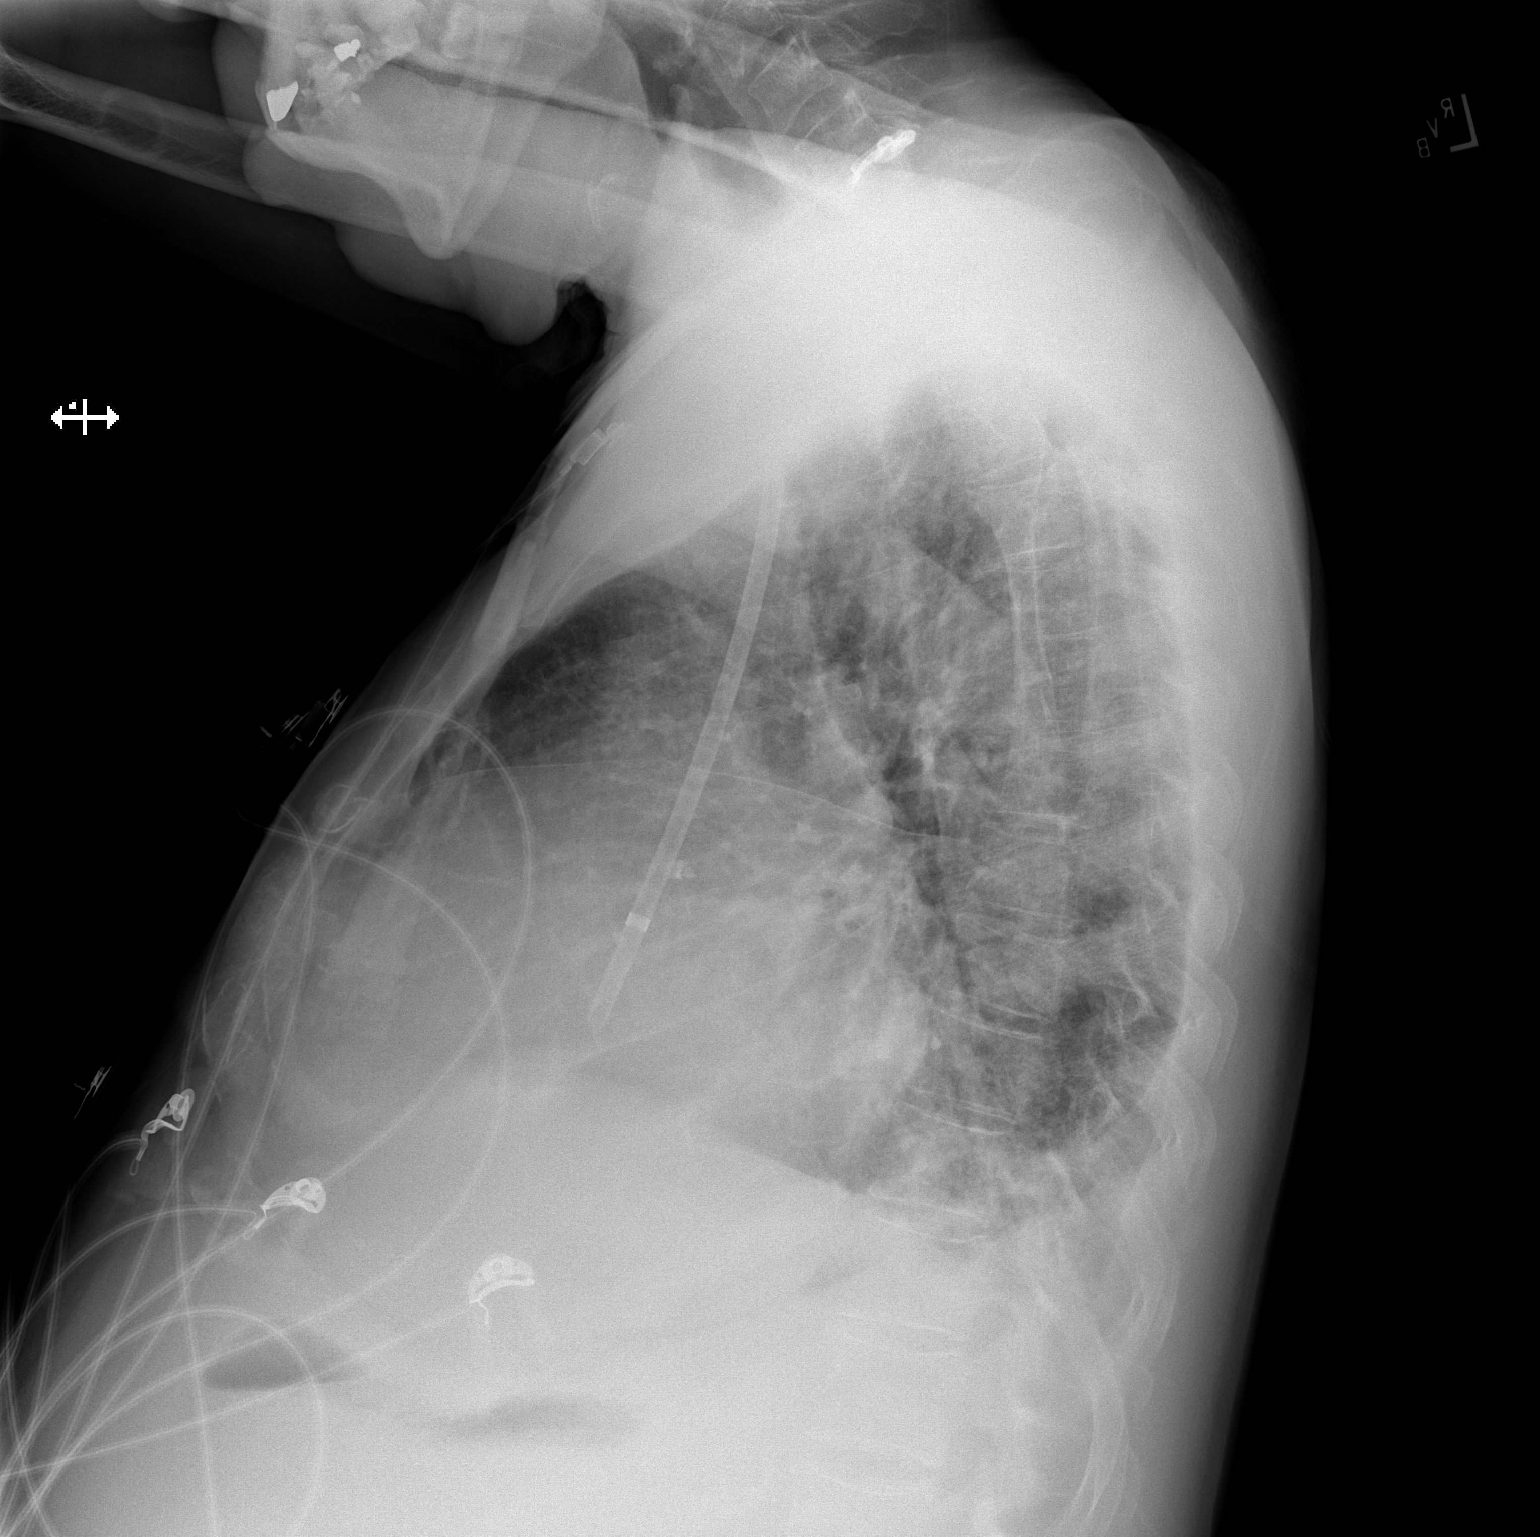

[2 of 2 positions shown; findings below may reference images not displayed]

FINDINGS: Unchanged tunneled right internal jugular dialysis catheter. Stable
moderate cardiomegaly. Normal pulmonary vascularity. Unchanged small
right pleural effusion with increased density in the right lower
lobe. No pneumothorax. No acute osseous abnormality.
IMPRESSION: 1. Unchanged small right pleural effusion with adjacent atelectasis
versus infiltrate.

## 2020-11-17 MED ORDER — AEROCHAMBER Z-STAT PLUS/MEDIUM MISC
1.0000 | Freq: Once | Status: AC
  Administered 2020-11-17: 1
  Filled 2020-11-17: qty 1

## 2020-11-17 MED ORDER — DOXYCYCLINE HYCLATE 100 MG PO TABS
100.0000 mg | ORAL_TABLET | Freq: Once | ORAL | Status: AC
  Administered 2020-11-17: 100 mg via ORAL
  Filled 2020-11-17: qty 1

## 2020-11-17 MED ORDER — DOXYCYCLINE HYCLATE 100 MG PO CAPS: 100.0000 mg | ORAL_CAPSULE | Freq: Two times a day (BID) | ORAL | 0 refills | Status: DC

## 2020-11-17 MED ORDER — ALBUTEROL SULFATE HFA 108 (90 BASE) MCG/ACT IN AERS
2.0000 | INHALATION_SPRAY | RESPIRATORY_TRACT | Status: DC | PRN
  Administered 2020-11-17: 2 via RESPIRATORY_TRACT
  Filled 2020-11-17: qty 6.7

## 2020-11-17 NOTE — Discharge Instructions (Signed)
Use your compression hose.

## 2020-11-17 NOTE — ED Provider Notes (Signed)
Emergency Medicine Provider Triage Evaluation Note  Michael [name]. , a 65 y.o. male  was evaluated in triage.  Pt complains of shortness of breath, cough, and wheezing x1.5 weeks.  Patient admits to increased lower extremity edema.  Shortness of breath at rest and with exertion.  Denies associated chest pain.  Denies history of blood clots.  Patient has a history of A. fib and is on ASA 81 mg however, no other anticoagulants.  Patient was evaluated at [facility] walk-in clinic prior to arrival where a chest x-ray was performed that showed "fluid around heart, per patient.  History of ESRD on dialysis Monday/Wednesday/Friday with last session on Monday.  No fever or chills.  Review of Systems  Positive: SOB, cough Negative: fever  Physical Exam  BP 90/76 (BP Location: Right Arm)   Pulse 78   Temp 98.3 F (36.8 C) (Oral)   Resp (!) 22   Ht 5\' 7"  (1.702 m)   Wt 85.7 kg   SpO2 97%   BMI 29.60 kg/m  Gen:   Awake, no distress   Resp:  Normal effort  MSK:   Moves extremities without difficulty  Other:  2+ pitting edema bilaterally  Medical Decision Making  Medically screening exam initiated at 1:56 PM.  Appropriate orders placed.  [name] was informed that the remainder of the evaluation will be completed by another provider, this initial triage assessment does not replace that evaluation, and the importance of remaining in the ED until their evaluation is complete.  Labs CXR to rule out pneumonia EKG   Karie Kirks 11/17/20 1359    Isla Pence, MD 11/17/20 1413

## 2020-11-17 NOTE — ED Triage Notes (Signed)
Patient states he has been having SOB and cough. Patient went to the [facility] walk-in clinic today and had a CXR which showed "fluid around my heart.". Patient states he has been having ankle swelling x 2 weeks.  Patient is a dialysis patient and last had dialysis yesterday.Michael [name]

## 2020-11-17 NOTE — ED Provider Notes (Signed)
[facility] DEPT Provider Note   CSN: [id number] Arrival date & time: 11/17/20  1301     History Chief Complaint  Patient presents with   Shortness of Breath   Cough    Michael Geffre. is a 65 y.o. male.  Pt presents to the ED today with sob and cough.  The pt said he's had a cough for several days and can't get rid of it.  Pt said he did a Covid test 4 days ago and it was negative.  He went to [facility] and had a CXR and was told he had fluid on his lungs pushing on his heart.  The pt said he is a dialysis pt and had dialysis yesterday.  Pt denies f/c.  He's been fully Covid vaccinated.      Past Medical History:  Diagnosis Date   Atrial arrhythmia    atrial tachycardia with variable AV conduction versus atypical aflutter 01/10/19, rate control (02/06/19)   Cataract    Dyspnea    on exertion   ESRD (end stage renal disease) ([place])    Hemo- MWF, Polycystic kidney disease   Fatigue    History of kidney stones    removal of stone- cysto   Hyperlipidemia    Hyperparathyroidism, secondary renal ([place])    Hypertension    Hypoxemia 12/12/2013   Nonischemic cardiomyopathy ([place])    Er 25% 2015, 55 % 2013   OSA on CPAP    no longer using cpap   OSA on CPAP 03/24/2014   Pneumonia    2015ish   Prostate cancer [facility])    Ventricular tachycardia//Freq PVCs    Wears glasses     Patient Active Problem List   Diagnosis Date Noted   TIA (transient ischemic attack) 08/19/2020   Malignant neoplasm of prostate ([place]) 03/25/2020   Leukopenia    Thrombocytopenia (H[facility])    Anemia of chronic disease    History of ventricular tachycardia    Anoxic brain injury ([place]) 06/04/2019   Status post creation of arteriovenous fistula 05/29/2019   Cardiac arrest ([place]) 05/29/2019   Unspecified atrial flutter ([place]) 05/21/2019   Atrial tachycardia ([place]) 02/06/2019   Pseudoaneurysm of AV hemodialysis fistula, initial encounter ([place]) 04/14/2017   Status post right inguinal  hernia repair-July 2017 11/06/2015   Hypersomnia with sleep apnea 09/15/2015   Bradycardia, drug induced 09/15/2015   OSA on CPAP 03/24/2014   Hypoxemia 12/12/2013   Hypersomnia with sleep apnea, unspecified 12/12/2013   Nonischemic cardiomyopathy ([place])    VT (ventricular tachycardia) ([place]) 05/13/2013   PVC's (premature ventricular contractions) 05/24/2012   Autosomal dominant polycystic kidney disease 02/27/2012   Pre-transplant evaluation for chronic kidney disease 02/27/2012   S/P appendectomy 02/27/2012   End stage renal disease ([place]) 02/27/2012   Essential hypertension 04/06/2011    Past Surgical History:  Procedure Laterality Date   A-FLUTTER ABLATION N/A 04/11/2019   Procedure: A-FLUTTER ABLATION;  Surgeon: Evans Lance, MD;  Location: [facility];  Service: Cardiovascular;  Laterality: N/A;   A/V FISTULAGRAM Left 04/27/2017   Procedure: A/V FISTULAGRAM;  Surgeon: Conrad Palestine, MD;  Location: [place] [facility];  Service: Cardiovascular;  Laterality: Left;  lt arm   A/V FISTULAGRAM Left 01/10/2019   Procedure: A/V FISTULAGRAM;  Surgeon: Marty Heck, MD;  Location: [place] [facility];  Service: Cardiovascular;  Laterality: Left;   APPENDECTOMY     AV FISTULA PLACEMENT  12/05/2011   Procedure: ARTERIOVENOUS (AV) FISTULA CREATION;LLEFT ARM  Surgeon: Conrad Halesite, MD;  Location: [place];  Service: Vascular;  Laterality: Left;  RADIO-CEPHALIC  fistula left arm   AV FISTULA PLACEMENT  01/11/2012   Procedure: ARTERIOVENOUS (AV) FISTULA CREATION;  Surgeon: Conrad Cavalier, MD;  Location: [facility];  Service: Vascular;  Laterality: Left;  Creation of left brachial cephalic arteriovenous fistula   AV FISTULA PLACEMENT Right 03/07/2019   Procedure: ARTERIOVENOUS (AV) FISTULA CREATION  RIGHT ARM;  Surgeon: Marty Heck, MD;  Location: [facility];  Service: Vascular;  Laterality: Right;   [place] Left 12/27/2016   Procedure: BASILIC VEIN TRANSPOSITION LEFT UPPER  ARM FIRST STAGE;  Surgeon: Conrad New Eucha, MD;  Location: [facility];  Service: Vascular;  Laterality: Left;   [place] Left 01/31/2017   Procedure: LEFT ARM BASILIC VEIN TRANSPOSITION, SECOND STAGE;  Surgeon: Conrad Tomball, MD;  Location: [facility];  Service: Vascular;  Laterality: Left;   CARDIAC CATHETERIZATION  04-05-2010   checking for blockage but none-[facility]   COLONOSCOPY     CYSTOSCOPY W/ STONE MANIPULATION     "laser once" (01/22/2013)   HEMATOMA EVACUATION Left 05/09/2017   Procedure: EVACUATION HEMATOMA LEFT ARM;  Surgeon: Conrad Emeryville, MD;  Location: [facility];  Service: Vascular;  Laterality: Left;   HERNIA REPAIR     INGUINAL HERNIA REPAIR Right 11/06/2015   Procedure: OPEN HERNIA REPAIR  RIGHT INGUINAL ADULT;  Surgeon: [name], MD;  Location: [facility];  Service: General;  Laterality: Right;  with MESH   INSERTION OF DIALYSIS CATHETER Right 10/05/2016   Procedure: INSERTION OF right internal jugular DIALYSIS CATHETER;  Surgeon: [name], MD;  Location: [place];  Service: Vascular;  Laterality: Right;   INSERTION OF MESH  03/20/2012   Procedure: INSERTION OF MESH;  Danella Maiers Surgeon: [name], MD;  Location: [facility];  Service: General;  Laterality: N/A;   INSERTION OF MESH N/A 01/22/2013   Procedure: INSERTION OF MESH;  Surgeon: [name], MD;  Location: [place];  Service: General;  Laterality: N/A;   LAPAROTOMY  04/02/2012   Procedure: EXPLORATORY LAPAROTOMY;  Surgeon: [name], MD;  Location: [place];  Service: General;  Laterality: N/A;  Exploratory Laparotomy with resection of small intestine   LEFT HEART CATHETERIZATION WITH CORONARY ANGIOGRAM N/A 05/14/2013   Procedure: LEFT HEART CATHETERIZATION WITH CORONARY ANGIOGRAM;  Surgeon: Sinclair Grooms, MD;  Location: [facility] CATH LAB;  Service: Cardiovascular;  Laterality: N/A;   LIGATION OF ARTERIOVENOUS  FISTULA Left 12/27/2016   Procedure: LIGATION/EXCISION OF LEFT UPPER ARM ARTERIOVENOUS  FISTULA;  EVACUATION OF  HEMATOMA;  Surgeon: Conrad Madrone, MD;  Location: [facility];  Service: Vascular;  Laterality: Left;   LIGATION OF ARTERIOVENOUS  FISTULA Left 03/07/2019   Procedure: LIGATION OF ARTERIOVENOUS FISTULA  LEFT UPPER ARM;  Surgeon: Marty Heck, MD;  Location: [facility];  Service: Vascular;  Laterality: Left;   REVISON OF ARTERIOVENOUS FISTULA Left 10/05/2016   Procedure: REVISON OF left arm ARTERIOVENOUS FISTULA;  Surgeon: [name], MD;  Location: [place];  Service: Vascular;  Laterality: Left;   TONSILLECTOMY     UMBILICAL HERNIA REPAIR  03/20/2012   Procedure: HERNIA REPAIR UMBILICAL ADULT;  Surgeon: [name], MD;  Location: [facility];  Service: General;  Laterality: N/A;   UMBILICAL HERNIA REPAIR  01/22/2013   preperitoneal open procedure due to significant adhesions/notes 01/22/2013   VENTRAL HERNIA REPAIR N/A 01/22/2013   Procedure: ATTEMPTED LAPAROSCOPIC VENTRAL HERNIA CONVERTED TO OPEN;  Surgeon: [name], MD;  Location: [facility] OR;  Service: General;  Laterality: N/A;       Family History  Problem Relation Age of Onset   Heart disease Mother    Hyperlipidemia Mother    Hypertension Mother    Kidney disease Father    Stroke Father    Kidney disease Brother    Amblyopia Neg Hx    Blindness Neg Hx    Cataracts Neg Hx    Diabetes Neg Hx    Glaucoma Neg Hx    Macular degeneration Neg Hx    Retinal detachment Neg Hx    Strabismus Neg Hx    Retinitis pigmentosa Neg Hx    Breast cancer Neg Hx    Prostate cancer Neg Hx    Colon cancer Neg Hx    Pancreatic cancer Neg Hx     Social History   Tobacco Use   Smoking status: Never   Smokeless tobacco: Never  Vaping Use   Vaping Use: Never used  Substance Use Topics   Alcohol use: No    Alcohol/week: 0.0 standard drinks   Drug use: No    Home Medications Prior to Admission medications   Medication Sig Start Date End Date Taking? Authorizing Provider  aspirin EC 81 MG EC tablet Take 1 tablet (81 mg total) by mouth  daily. 05/19/13  Yes Lendon Colonel, NP  doxycycline (VIBRAMYCIN) 100 MG capsule Take 1 capsule (100 mg total) by mouth 2 (two) times daily. 11/17/20  Yes Isla Pence, MD  acyclovir (ZOVIRAX) 400 MG tablet Take 1 tablet (400 mg total) by mouth daily with lunch. Patient not taking: Reported on 11/17/2020 12/03/19   [name], MD  apixaban (ELIQUIS) 5 MG TABS tablet Take 1 tablet (5 mg total) by mouth 2 (two) times daily. 02/21/20   Tommie Raymond, NP  AURYXIA 1 GM 210 MG(Fe) tablet Take by mouth. 08/02/20   [provider]  cinacalcet (SENSIPAR) 30 MG tablet Take 30 mg by mouth daily. 10/05/20   [provider]  clotrimazole-betamethasone (LOTRISONE) cream Apply 1 application topically 2 (two) times daily. 09/16/20   Edrick Kins, DPM  nitroGLYCERIN (NITROSTAT) 0.4 MG SL tablet Place 1 tablet (0.4 mg total) under the tongue every 5 (five) minutes x 3 doses as needed for chest pain. 05/19/13   Lendon Colonel, NP  prednisoLONE acetate (PRED FORTE) 1 % ophthalmic suspension Place 1 drop into the right eye daily at 12 noon.  Patient not taking: Reported on 11/17/2020    [provider]  rosuvastatin (CRESTOR) 20 MG tablet Take 20 mg by mouth daily. 08/27/20   [provider]  sevelamer carbonate (RENVELA) 800 MG tablet Take 2 tablets (1,600 mg total) by mouth 3 (three) times daily with meals. 06/11/19   Love, Ivan Anchors, PA-C  terbinafine (LAMISIL) 250 MG tablet Take 1 tablet by mouth once daily 10/12/20   Edrick Kins, DPM    Allergies    Patient has no known allergies.  Review of Systems   Review of Systems  Respiratory:  Positive for cough.   All other systems reviewed and are negative.  Physical Exam Updated Vital Signs BP 95/74   Pulse 86   Temp 98.3 F (36.8 C) (Oral)   Resp (!) 24   Ht 5\' 7"  (1.702 m)   Wt 85.7 kg   SpO2 96%   BMI 29.60 kg/m   Physical Exam Vitals and nursing note reviewed.  Constitutional:  Appearance: He is  well-developed.  HENT:     Head: Normocephalic and atraumatic.     Mouth/Throat:     Mouth: Mucous membranes are moist.     Pharynx: Oropharynx is clear.  Eyes:     Extraocular Movements: Extraocular movements intact.     Pupils: Pupils are equal, round, and reactive to light.  Cardiovascular:     Rate and Rhythm: Normal rate. Rhythm irregular.  Pulmonary:     Effort: Pulmonary effort is normal.     Breath sounds: Normal breath sounds.  Chest:     Comments: Tunneled cath in right upper chest Abdominal:     General: Bowel sounds are normal.     Palpations: Abdomen is soft.  Musculoskeletal:        General: Normal range of motion.     Cervical back: Normal range of motion and neck supple.     Right lower leg: Edema present.     Left lower leg: Edema present.  Skin:    General: Skin is warm.     Capillary Refill: Capillary refill takes less than 2 seconds.  Neurological:     General: No focal deficit present.     Mental Status: He is alert and oriented to person, place, and time.  Psychiatric:        Mood and Affect: Mood normal.        Behavior: Behavior normal.    ED Results / Procedures / Treatments   Labs (all labs ordered are listed, but only abnormal results are displayed) Labs Reviewed  CBC WITH DIFFERENTIAL/PLATELET - Abnormal; Notable for the following components:      Result Value   Lymphs Abs 0.4 (*)    All other components within normal limits  BASIC METABOLIC PANEL - Abnormal; Notable for the following components:   Glucose, Bld 60 (*)    BUN 25 (*)    Creatinine, Ser 8.59 (*)    Calcium 8.5 (*)    GFR, Estimated 6 (*)    All other components within normal limits  BRAIN NATRIURETIC PEPTIDE - Abnormal; Notable for the following components:   B Natriuretic Peptide 2,991.4 (*)    All other components within normal limits  RESP PANEL BY RT-PCR (FLU A&B, COVID) [id number]    EKG EKG Interpretation  Date/Time:  Tuesday November 17 2020 13:25:50 EDT Ventricular  Rate:  77 PR Interval:    QRS Duration: 162 QT Interval:  468 QTC Calculation: 530 R Axis:   -89 Text Interpretation: Atrial fibrillation Paired ventricular premature complexes Nonspecific IVCD with LAD Left ventricular hypertrophy Inferior infarct, old Anterior infarct, old Lateral leads are also involved 12 Lead; Mason-[place] Abnormal ECG Confirmed by Carmin Muskrat [phone]) on 11/17/2020 1:42:05 PM  Radiology DG Chest 2 View  Result Date: 11/17/2020 CLINICAL DATA:  Cough, shortness of breath, and weakness for several weeks. EXAM: CHEST - 2 VIEW COMPARISON:  Chest x-ray from same day. FINDINGS: Unchanged tunneled right internal jugular dialysis catheter. Stable moderate cardiomegaly. Normal pulmonary vascularity. Unchanged small right pleural effusion with increased density in the right lower lobe. No pneumothorax. No acute osseous abnormality. IMPRESSION: 1. Unchanged small right pleural effusion with adjacent atelectasis versus infiltrate. Electronically Signed   By: Titus Dubin M.D.   On: 11/17/2020 14:15    Procedures Procedures   Medications Ordered in ED Medications  albuterol (VENTOLIN HFA) 108 (90 Base) MCG/ACT inhaler 2 puff (2 puffs Inhalation Given 11/17/20 1630)  doxycycline (VIBRA-TABS) tablet 100 mg (has no administration  in time range)  aerochamber Z-Stat Plus/medium 1 each (1 each Other Given 11/17/20 1630)    ED Course  I have reviewed the triage vital signs and the nursing notes.  Pertinent labs & imaging results that were available during my care of the patient were reviewed by me and considered in my medical decision making (see chart for details).    MDM Rules/Calculators/A&P                           CXR does not show any significant change.  Pt is due for dialysis tomorrow and is not hypoxic and not hyperkalemic.  He can wait.  I am going to put him on abx because his sx have been going on several days.  Doxy does not need a renal dose adjustment, so he will  go home on that.  Pt is going to try to find his compression hose that he had made in [place].  That will help with his leg swelling.  He urinates a very small amount, so I don't think adding a diuretic will be helpful.  He is to return if worse.  F/u with pcp.  Arya Boxley. was evaluated in Emergency Department on 11/17/2020 for the symptoms described in the history of present illness. He was evaluated in the context of the global COVID-19 pandemic, which necessitated consideration that the patient might be at risk for infection with the SARS-CoV-2 virus that causes COVID-19. Institutional protocols and algorithms that pertain to the evaluation of patients at risk for COVID-19 are in a state of rapid change based on information released by regulatory bodies including the CDC and federal and state organizations. These policies and algorithms were followed during the patient's care in the ED.    Final Clinical Impression(s) / ED Diagnoses Final diagnoses:  ESRD on hemodialysis ([place])  Bronchitis  Peripheral edema    Rx / DC Orders ED Discharge Orders          Ordered    doxycycline (VIBRAMYCIN) 100 MG capsule  2 times daily        11/17/20 1631             Isla Pence, MD 11/17/20 1652

## 2020-11-20 DIAGNOSIS — Z992 Dependence on renal dialysis: Secondary | ICD-10-CM | POA: Diagnosis not present

## 2020-11-20 DIAGNOSIS — N2581 Secondary hyperparathyroidism of renal origin: Secondary | ICD-10-CM | POA: Diagnosis not present

## 2020-11-20 DIAGNOSIS — N186 End stage renal disease: Secondary | ICD-10-CM | POA: Diagnosis not present

## 2020-11-23 ENCOUNTER — Ambulatory Visit (INDEPENDENT_AMBULATORY_CARE_PROVIDER_SITE_OTHER): Payer: Medicare Other | Admitting: Podiatry

## 2020-11-23 ENCOUNTER — Other Ambulatory Visit: Payer: Self-pay

## 2020-11-23 DIAGNOSIS — N2581 Secondary hyperparathyroidism of renal origin: Secondary | ICD-10-CM | POA: Diagnosis not present

## 2020-11-23 DIAGNOSIS — L989 Disorder of the skin and subcutaneous tissue, unspecified: Secondary | ICD-10-CM

## 2020-11-23 DIAGNOSIS — M79672 Pain in left foot: Secondary | ICD-10-CM | POA: Diagnosis not present

## 2020-11-23 DIAGNOSIS — B353 Tinea pedis: Secondary | ICD-10-CM

## 2020-11-23 DIAGNOSIS — M79671 Pain in right foot: Secondary | ICD-10-CM | POA: Diagnosis not present

## 2020-11-23 DIAGNOSIS — Z992 Dependence on renal dialysis: Secondary | ICD-10-CM | POA: Diagnosis not present

## 2020-11-23 DIAGNOSIS — N186 End stage renal disease: Secondary | ICD-10-CM | POA: Diagnosis not present

## 2020-11-23 MED ORDER — TERBINAFINE HCL 250 MG PO TABS: 250.0000 mg | ORAL_TABLET | Freq: Every day | ORAL | 1 refills | Status: DC

## 2020-11-23 NOTE — Progress Notes (Signed)
   HPI: 65 y.o. male presenting today for follow-up evaluation of thick skin and itching to the bilateral feet.  Overall the patient has noticed significant improvement.  He states that the pain and tenderness have improved significantly.  No new complaints at this time  Past Medical History:  Diagnosis Date   Atrial arrhythmia    atrial tachycardia with variable AV conduction versus atypical aflutter 01/10/19, rate control (02/06/19)   Cataract    Dyspnea    on exertion   ESRD (end stage renal disease) ([place])    Hemo- MWF, Polycystic kidney disease   Fatigue    History of kidney stones    removal of stone- cysto   Hyperlipidemia    Hyperparathyroidism, secondary renal ([place])    Hypertension    Hypoxemia 12/12/2013   Nonischemic cardiomyopathy ([place])    Er 25% 2015, 55 % 2013   OSA on CPAP    no longer using cpap   OSA on CPAP 03/24/2014   Pneumonia    2015ish   Prostate cancer [facility])    Ventricular tachycardia//Freq PVCs    Wears glasses      Physical Exam: General: The patient is alert and oriented x3 in no acute distress.  Dermatology: Skin is warm, dry and supple bilateral lower extremities. Negative for open lesions or macerations. Diffuse hyperkeratosis of skin along the volar surface of the foot with focal areas of porokeratosis/keratoderma.   Vascular: Palpable pedal pulses bilaterally. No edema or erythema noted. Capillary refill within normal limits.  Neurological: Epicritic and protective threshold grossly intact bilaterally.   Musculoskeletal Exam: Range of motion within normal limits to all pedal and ankle joints bilateral. Muscle strength 5/5 in all groups bilateral.  Associated tenderness to palpation noted bilateral feet to the porokeratosis lesions  Assessment: 1. Chronic Tinea pedis bilateral 2.  Multiple porokeratosis bilateral feet   Plan of Care:  1. Patient evaluated.  Excisional debridement of the hyperkeratotic callus lesions was performed using a  tissue nipper without incident or bleeding to the bilateral feet 2.  Refill prescription for oral Lamisil 250 mg #30 with 1 refill  3.  Patient continues to apply the Lotrisone cream 2 times daily.  Continue 4.  Salicylic acid provided for the patient to apply daily to the most symptomatic porokeratosis lesions 5.  Return to clinic in 1 month     Edrick Kins, DPM [facility]  Dr. Edrick Kins, DPM    [address],  [address]                Office [phone]  Fax [phone]

## 2020-11-25 DIAGNOSIS — N2581 Secondary hyperparathyroidism of renal origin: Secondary | ICD-10-CM | POA: Diagnosis not present

## 2020-11-25 DIAGNOSIS — N186 End stage renal disease: Secondary | ICD-10-CM | POA: Diagnosis not present

## 2020-11-25 DIAGNOSIS — Z992 Dependence on renal dialysis: Secondary | ICD-10-CM | POA: Diagnosis not present

## 2020-11-27 DIAGNOSIS — N2581 Secondary hyperparathyroidism of renal origin: Secondary | ICD-10-CM | POA: Diagnosis not present

## 2020-11-27 DIAGNOSIS — Z992 Dependence on renal dialysis: Secondary | ICD-10-CM | POA: Diagnosis not present

## 2020-11-27 DIAGNOSIS — N186 End stage renal disease: Secondary | ICD-10-CM | POA: Diagnosis not present

## 2020-11-30 DIAGNOSIS — Z992 Dependence on renal dialysis: Secondary | ICD-10-CM | POA: Diagnosis not present

## 2020-11-30 DIAGNOSIS — Q612 Polycystic kidney, adult type: Secondary | ICD-10-CM | POA: Diagnosis not present

## 2020-11-30 DIAGNOSIS — N2581 Secondary hyperparathyroidism of renal origin: Secondary | ICD-10-CM | POA: Diagnosis not present

## 2020-11-30 DIAGNOSIS — N186 End stage renal disease: Secondary | ICD-10-CM | POA: Diagnosis not present

## 2020-12-02 DIAGNOSIS — N2581 Secondary hyperparathyroidism of renal origin: Secondary | ICD-10-CM | POA: Diagnosis not present

## 2020-12-02 DIAGNOSIS — Z992 Dependence on renal dialysis: Secondary | ICD-10-CM | POA: Diagnosis not present

## 2020-12-02 DIAGNOSIS — N186 End stage renal disease: Secondary | ICD-10-CM | POA: Diagnosis not present

## 2020-12-04 DIAGNOSIS — N186 End stage renal disease: Secondary | ICD-10-CM | POA: Diagnosis not present

## 2020-12-04 DIAGNOSIS — N2581 Secondary hyperparathyroidism of renal origin: Secondary | ICD-10-CM | POA: Diagnosis not present

## 2020-12-04 DIAGNOSIS — Z992 Dependence on renal dialysis: Secondary | ICD-10-CM | POA: Diagnosis not present

## 2020-12-07 DIAGNOSIS — N2581 Secondary hyperparathyroidism of renal origin: Secondary | ICD-10-CM | POA: Diagnosis not present

## 2020-12-07 DIAGNOSIS — Z992 Dependence on renal dialysis: Secondary | ICD-10-CM | POA: Diagnosis not present

## 2020-12-07 DIAGNOSIS — N186 End stage renal disease: Secondary | ICD-10-CM | POA: Diagnosis not present

## 2020-12-09 DIAGNOSIS — N186 End stage renal disease: Secondary | ICD-10-CM | POA: Diagnosis not present

## 2020-12-09 DIAGNOSIS — N2581 Secondary hyperparathyroidism of renal origin: Secondary | ICD-10-CM | POA: Diagnosis not present

## 2020-12-09 DIAGNOSIS — Z992 Dependence on renal dialysis: Secondary | ICD-10-CM | POA: Diagnosis not present

## 2020-12-11 DIAGNOSIS — N186 End stage renal disease: Secondary | ICD-10-CM | POA: Diagnosis not present

## 2020-12-11 DIAGNOSIS — N2581 Secondary hyperparathyroidism of renal origin: Secondary | ICD-10-CM | POA: Diagnosis not present

## 2020-12-11 DIAGNOSIS — Z992 Dependence on renal dialysis: Secondary | ICD-10-CM | POA: Diagnosis not present

## 2020-12-14 DIAGNOSIS — Z992 Dependence on renal dialysis: Secondary | ICD-10-CM | POA: Diagnosis not present

## 2020-12-14 DIAGNOSIS — N2581 Secondary hyperparathyroidism of renal origin: Secondary | ICD-10-CM | POA: Diagnosis not present

## 2020-12-14 DIAGNOSIS — N186 End stage renal disease: Secondary | ICD-10-CM | POA: Diagnosis not present

## 2020-12-16 DIAGNOSIS — Z992 Dependence on renal dialysis: Secondary | ICD-10-CM | POA: Diagnosis not present

## 2020-12-16 DIAGNOSIS — N186 End stage renal disease: Secondary | ICD-10-CM | POA: Diagnosis not present

## 2020-12-16 DIAGNOSIS — N2581 Secondary hyperparathyroidism of renal origin: Secondary | ICD-10-CM | POA: Diagnosis not present

## 2020-12-18 ENCOUNTER — Other Ambulatory Visit: Payer: Self-pay | Admitting: Podiatry

## 2020-12-18 DIAGNOSIS — N186 End stage renal disease: Secondary | ICD-10-CM | POA: Diagnosis not present

## 2020-12-18 DIAGNOSIS — N2581 Secondary hyperparathyroidism of renal origin: Secondary | ICD-10-CM | POA: Diagnosis not present

## 2020-12-18 DIAGNOSIS — Z992 Dependence on renal dialysis: Secondary | ICD-10-CM | POA: Diagnosis not present

## 2020-12-18 NOTE — Telephone Encounter (Signed)
Please advise 

## 2020-12-21 DIAGNOSIS — Z992 Dependence on renal dialysis: Secondary | ICD-10-CM | POA: Diagnosis not present

## 2020-12-21 DIAGNOSIS — N186 End stage renal disease: Secondary | ICD-10-CM | POA: Diagnosis not present

## 2020-12-21 DIAGNOSIS — N2581 Secondary hyperparathyroidism of renal origin: Secondary | ICD-10-CM | POA: Diagnosis not present

## 2020-12-25 DIAGNOSIS — Z992 Dependence on renal dialysis: Secondary | ICD-10-CM | POA: Diagnosis not present

## 2020-12-25 DIAGNOSIS — N186 End stage renal disease: Secondary | ICD-10-CM | POA: Diagnosis not present

## 2020-12-25 DIAGNOSIS — N2581 Secondary hyperparathyroidism of renal origin: Secondary | ICD-10-CM | POA: Diagnosis not present

## 2020-12-28 ENCOUNTER — Ambulatory Visit (INDEPENDENT_AMBULATORY_CARE_PROVIDER_SITE_OTHER): Payer: Medicare Other | Admitting: Podiatry

## 2020-12-28 ENCOUNTER — Other Ambulatory Visit: Payer: Self-pay

## 2020-12-28 DIAGNOSIS — M79671 Pain in right foot: Secondary | ICD-10-CM

## 2020-12-28 DIAGNOSIS — L989 Disorder of the skin and subcutaneous tissue, unspecified: Secondary | ICD-10-CM | POA: Diagnosis not present

## 2020-12-28 DIAGNOSIS — M79672 Pain in left foot: Secondary | ICD-10-CM

## 2020-12-28 DIAGNOSIS — N2581 Secondary hyperparathyroidism of renal origin: Secondary | ICD-10-CM | POA: Diagnosis not present

## 2020-12-28 DIAGNOSIS — N186 End stage renal disease: Secondary | ICD-10-CM | POA: Diagnosis not present

## 2020-12-28 DIAGNOSIS — B353 Tinea pedis: Secondary | ICD-10-CM

## 2020-12-28 DIAGNOSIS — Z992 Dependence on renal dialysis: Secondary | ICD-10-CM | POA: Diagnosis not present

## 2020-12-28 MED ORDER — TERBINAFINE HCL 250 MG PO TABS: 250.0000 mg | ORAL_TABLET | Freq: Every day | ORAL | 1 refills | Status: DC

## 2020-12-28 MED ORDER — CLOTRIMAZOLE-BETAMETHASONE 1-0.05 % EX CREA: 1.0000 "application " | TOPICAL_CREAM | Freq: Two times a day (BID) | CUTANEOUS | 2 refills | Status: DC

## 2020-12-30 DIAGNOSIS — Z992 Dependence on renal dialysis: Secondary | ICD-10-CM | POA: Diagnosis not present

## 2020-12-30 DIAGNOSIS — N186 End stage renal disease: Secondary | ICD-10-CM | POA: Diagnosis not present

## 2020-12-30 DIAGNOSIS — N2581 Secondary hyperparathyroidism of renal origin: Secondary | ICD-10-CM | POA: Diagnosis not present

## 2020-12-31 DIAGNOSIS — N186 End stage renal disease: Secondary | ICD-10-CM | POA: Diagnosis not present

## 2020-12-31 DIAGNOSIS — Z992 Dependence on renal dialysis: Secondary | ICD-10-CM | POA: Diagnosis not present

## 2020-12-31 DIAGNOSIS — Q612 Polycystic kidney, adult type: Secondary | ICD-10-CM | POA: Diagnosis not present

## 2021-01-01 DIAGNOSIS — N186 End stage renal disease: Secondary | ICD-10-CM | POA: Diagnosis not present

## 2021-01-01 DIAGNOSIS — Z992 Dependence on renal dialysis: Secondary | ICD-10-CM | POA: Diagnosis not present

## 2021-01-01 DIAGNOSIS — N2581 Secondary hyperparathyroidism of renal origin: Secondary | ICD-10-CM | POA: Diagnosis not present

## 2021-01-04 DIAGNOSIS — Z992 Dependence on renal dialysis: Secondary | ICD-10-CM | POA: Diagnosis not present

## 2021-01-04 DIAGNOSIS — N2581 Secondary hyperparathyroidism of renal origin: Secondary | ICD-10-CM | POA: Diagnosis not present

## 2021-01-04 DIAGNOSIS — N186 End stage renal disease: Secondary | ICD-10-CM | POA: Diagnosis not present

## 2021-01-06 DIAGNOSIS — N186 End stage renal disease: Secondary | ICD-10-CM | POA: Diagnosis not present

## 2021-01-06 DIAGNOSIS — N2581 Secondary hyperparathyroidism of renal origin: Secondary | ICD-10-CM | POA: Diagnosis not present

## 2021-01-06 DIAGNOSIS — Z992 Dependence on renal dialysis: Secondary | ICD-10-CM | POA: Diagnosis not present

## 2021-01-08 DIAGNOSIS — N186 End stage renal disease: Secondary | ICD-10-CM | POA: Diagnosis not present

## 2021-01-08 DIAGNOSIS — N2581 Secondary hyperparathyroidism of renal origin: Secondary | ICD-10-CM | POA: Diagnosis not present

## 2021-01-08 DIAGNOSIS — Z992 Dependence on renal dialysis: Secondary | ICD-10-CM | POA: Diagnosis not present

## 2021-01-11 DIAGNOSIS — Z992 Dependence on renal dialysis: Secondary | ICD-10-CM | POA: Diagnosis not present

## 2021-01-11 DIAGNOSIS — N186 End stage renal disease: Secondary | ICD-10-CM | POA: Diagnosis not present

## 2021-01-11 DIAGNOSIS — N2581 Secondary hyperparathyroidism of renal origin: Secondary | ICD-10-CM | POA: Diagnosis not present

## 2021-01-11 NOTE — Progress Notes (Signed)
   HPI: 65 y.o. male presenting today for follow-up evaluation of thick skin and itching to the bilateral feet.  Overall the patient has noticed significant improvement.  He states that the pain and tenderness have improved significantly.  No new complaints at this time  Past Medical History:  Diagnosis Date   Atrial arrhythmia    atrial tachycardia with variable AV conduction versus atypical aflutter 01/10/19, rate control (02/06/19)   Cataract    Dyspnea    on exertion   ESRD (end stage renal disease) ([place])    Hemo- MWF, Polycystic kidney disease   Fatigue    History of kidney stones    removal of stone- cysto   Hyperlipidemia    Hyperparathyroidism, secondary renal ([place])    Hypertension    Hypoxemia 12/12/2013   Nonischemic cardiomyopathy ([place])    Er 25% 2015, 55 % 2013   OSA on CPAP    no longer using cpap   OSA on CPAP 03/24/2014   Pneumonia    2015ish   Prostate cancer [facility])    Ventricular tachycardia//Freq PVCs    Wears glasses      Physical Exam: General: The patient is alert and oriented x3 in no acute distress.  Dermatology: Skin is warm, dry and supple bilateral lower extremities. Negative for open lesions or macerations. Diffuse hyperkeratosis of skin along the volar surface of the foot with focal areas of porokeratosis/keratoderma.   Vascular: Palpable pedal pulses bilaterally. No edema or erythema noted. Capillary refill within normal limits.  Neurological: Epicritic and protective threshold grossly intact bilaterally.   Musculoskeletal Exam: Range of motion within normal limits to all pedal and ankle joints bilateral. Muscle strength 5/5 in all groups bilateral.  Associated tenderness to palpation noted bilateral feet to the porokeratosis lesions  Assessment: 1. Chronic Tinea pedis bilateral 2.  Multiple porokeratosis bilateral feet   Plan of Care:  1. Patient evaluated.  Excisional debridement of the hyperkeratotic callus lesions was performed using a  tissue nipper without incident or bleeding to the bilateral feet 2.  Patient continues to apply the Lotrisone cream 2 times daily.  Continue 3.  Discontinue the salicylic acid that was provided in office last visit.  Recommend good hydrating lotion daily in combination with the Lotrisone cream 4.  Return to clinic as needed     Edrick Kins, DPM [facility]  Dr. Edrick Kins, DPM    [address], [place] [address]                Office [phone]  Fax [phone]

## 2021-01-13 DIAGNOSIS — N2581 Secondary hyperparathyroidism of renal origin: Secondary | ICD-10-CM | POA: Diagnosis not present

## 2021-01-13 DIAGNOSIS — Z992 Dependence on renal dialysis: Secondary | ICD-10-CM | POA: Diagnosis not present

## 2021-01-13 DIAGNOSIS — N186 End stage renal disease: Secondary | ICD-10-CM | POA: Diagnosis not present

## 2021-01-15 DIAGNOSIS — N186 End stage renal disease: Secondary | ICD-10-CM | POA: Diagnosis not present

## 2021-01-15 DIAGNOSIS — Z992 Dependence on renal dialysis: Secondary | ICD-10-CM | POA: Diagnosis not present

## 2021-01-15 DIAGNOSIS — N2581 Secondary hyperparathyroidism of renal origin: Secondary | ICD-10-CM | POA: Diagnosis not present

## 2021-01-18 DIAGNOSIS — N2581 Secondary hyperparathyroidism of renal origin: Secondary | ICD-10-CM | POA: Diagnosis not present

## 2021-01-18 DIAGNOSIS — N186 End stage renal disease: Secondary | ICD-10-CM | POA: Diagnosis not present

## 2021-01-18 DIAGNOSIS — Z992 Dependence on renal dialysis: Secondary | ICD-10-CM | POA: Diagnosis not present

## 2021-01-20 DIAGNOSIS — Z992 Dependence on renal dialysis: Secondary | ICD-10-CM | POA: Diagnosis not present

## 2021-01-20 DIAGNOSIS — N2581 Secondary hyperparathyroidism of renal origin: Secondary | ICD-10-CM | POA: Diagnosis not present

## 2021-01-20 DIAGNOSIS — N186 End stage renal disease: Secondary | ICD-10-CM | POA: Diagnosis not present

## 2021-01-21 ENCOUNTER — Inpatient Hospital Stay: Payer: Medicare Other | Attending: Oncology | Admitting: Oncology

## 2021-01-21 ENCOUNTER — Inpatient Hospital Stay: Payer: Medicare Other

## 2021-01-21 ENCOUNTER — Other Ambulatory Visit: Payer: Self-pay

## 2021-01-21 VITALS — BP 116/83 | HR 89 | Temp 98.0°F | Resp 17 | Ht 67.0 in | Wt 190.2 lb

## 2021-01-21 DIAGNOSIS — D45 Polycythemia vera: Secondary | ICD-10-CM

## 2021-01-21 DIAGNOSIS — N186 End stage renal disease: Secondary | ICD-10-CM | POA: Insufficient documentation

## 2021-01-21 DIAGNOSIS — Z992 Dependence on renal dialysis: Secondary | ICD-10-CM | POA: Diagnosis not present

## 2021-01-21 DIAGNOSIS — C61 Malignant neoplasm of prostate: Secondary | ICD-10-CM | POA: Diagnosis not present

## 2021-01-21 DIAGNOSIS — D751 Secondary polycythemia: Secondary | ICD-10-CM | POA: Diagnosis not present

## 2021-01-21 LAB — IRON AND TIBC
Iron: 30 ug/dL — ABNORMAL LOW (ref 42–163)
Saturation Ratios: 9 % — ABNORMAL LOW (ref 20–55)
TIBC: 329 ug/dL (ref 202–409)
UIBC: 298 ug/dL (ref 117–376)

## 2021-01-21 LAB — CBC WITH DIFFERENTIAL (CANCER CENTER ONLY)
Abs Immature Granulocytes: 0.01 10*3/uL (ref 0.00–0.07)
Basophils Absolute: 0.1 10*3/uL (ref 0.0–0.1)
Basophils Relative: 2 %
Eosinophils Absolute: 0.1 10*3/uL (ref 0.0–0.5)
Eosinophils Relative: 4 %
HCT: 49.3 % (ref 39.0–52.0)
Hemoglobin: 15.6 g/dL (ref 13.0–17.0)
Immature Granulocytes: 0 %
Lymphocytes Relative: 7 %
Lymphs Abs: 0.2 10*3/uL — ABNORMAL LOW (ref 0.7–4.0)
MCH: 29.9 pg (ref 26.0–34.0)
MCHC: 31.6 g/dL (ref 30.0–36.0)
MCV: 94.6 fL (ref 80.0–100.0)
Monocytes Absolute: 0.4 10*3/uL (ref 0.1–1.0)
Monocytes Relative: 15 %
Neutro Abs: 2.1 10*3/uL (ref 1.7–7.7)
Neutrophils Relative %: 72 %
Platelet Count: 115 10*3/uL — ABNORMAL LOW (ref 150–400)
RBC: 5.21 MIL/uL (ref 4.22–5.81)
RDW: 14.4 % (ref 11.5–15.5)
WBC Count: 2.9 10*3/uL — ABNORMAL LOW (ref 4.0–10.5)
nRBC: 0 % (ref 0.0–0.2)

## 2021-01-21 NOTE — Progress Notes (Signed)
Reason for the request:    Polycythemia  HPI: I was asked by Dr. Carolin Sicks to evaluate Michael [name] for elevated hemoglobin.  He is a 65 year old man with history of end-stage renal disease currently on dialysis as well as diagnosis of prostate cancer.  He was found to have T1c adenocarcinoma with Gleason score 4+3 equal 7 and a PSA of 7 in 2021.  He underwent definitive therapy with radiation and received 70 Gray in 28 fractions between February and March 2022.  He is currently following with Dr. Lovena Neighbours regarding this issue with PSA responding appropriately.  He was found to have elevated hemoglobin in June 2022.  His hemoglobin was 17.2 on June 15.  It was 17.7 on June 22 and was 16.5 on June 29.  CBC obtained on November 17, 2020 showed a white cell count of 4.5, hemoglobin of 16, hematocrit of 51 with a platelet count of 189.  Previous CBCs in the past showed hemoglobin close to normal range with a normal hematological parameters, indices and white cell count differentiation.  Clinically, he reports no major complaints at this time.  He denies any toxic fume or carbon monoxide exposure.  He denies any hormone exposure.  He does report symptoms sleep apnea and low oxygen tension especially during dialysis.  He has use oxygen during dialysis at times.  He denies any thrombosis or bleeding complications.  He does not report any headaches, blurry vision, syncope or seizures. Does not report any fevers, chills or sweats.  Does not report any cough, wheezing or hemoptysis.  Does not report any chest pain, palpitation, orthopnea or leg edema.  Does not report any nausea, vomiting or abdominal pain.  Does not report any constipation or diarrhea.  Does not report any skeletal complaints.    Does not report frequency, urgency or hematuria.  Does not report any skin rashes or lesions. Does not report any heat or cold intolerance.  Does not report any lymphadenopathy or petechiae.  Does not report any anxiety or  depression.  Remaining review of systems is negative.     Past Medical History:  Diagnosis Date   Atrial arrhythmia    atrial tachycardia with variable AV conduction versus atypical aflutter 01/10/19, rate control (02/06/19)   Cataract    Dyspnea    on exertion   ESRD (end stage renal disease) ([place])    Hemo- MWF, Polycystic kidney disease   Fatigue    History of kidney stones    removal of stone- cysto   Hyperlipidemia    Hyperparathyroidism, secondary renal ([place])    Hypertension    Hypoxemia 12/12/2013   Nonischemic cardiomyopathy ([place])    Er 25% 2015, 55 % 2013   OSA on CPAP    no longer using cpap   OSA on CPAP 03/24/2014   Pneumonia    2015ish   Prostate cancer ([place])    Ventricular tachycardia//Freq PVCs    Wears glasses   :   Past Surgical History:  Procedure Laterality Date   A-FLUTTER ABLATION N/A 04/11/2019   Procedure: A-FLUTTER ABLATION;  Surgeon: Evans Lance, MD;  Location: [place] [facility];  Service: Cardiovascular;  Laterality: N/A;   A/V FISTULAGRAM Left 04/27/2017   Procedure: A/V FISTULAGRAM;  Surgeon: Conrad Ionia, MD;  Location: [place] [facility];  Service: Cardiovascular;  Laterality: Left;  lt arm   A/V FISTULAGRAM Left 01/10/2019   Procedure: A/V FISTULAGRAM;  Surgeon: Marty Heck, MD;  Location: [place] [facility];  Service: Cardiovascular;  Laterality: Left;   APPENDECTOMY     AV FISTULA PLACEMENT  12/05/2011   Procedure: ARTERIOVENOUS (AV) FISTULA CREATION;LLEFT ARM  Surgeon: Conrad Roderfield, MD;  Location: [place];  Service: Vascular;  Laterality: Left;  RADIO-CEPHALIC  fistula left arm   AV FISTULA PLACEMENT  01/11/2012   Procedure: ARTERIOVENOUS (AV) FISTULA CREATION;  Surgeon: Conrad Frontier, MD;  Location: [place];  Service: Vascular;  Laterality: Left;  Creation of left brachial cephalic arteriovenous fistula   AV FISTULA PLACEMENT Right 03/07/2019   Procedure: ARTERIOVENOUS (AV) FISTULA CREATION  RIGHT ARM;  Surgeon: Marty Heck,  MD;  Location: [place];  Service: Vascular;  Laterality: Right;   [place] Left 12/27/2016   Procedure: BASILIC VEIN TRANSPOSITION LEFT UPPER ARM FIRST STAGE;  Surgeon: Conrad Yarrowsburg, MD;  Location: [place];  Service: Vascular;  Laterality: Left;   [place] Left 01/31/2017   Procedure: LEFT ARM BASILIC VEIN TRANSPOSITION, SECOND STAGE;  Surgeon: Conrad Ciales, MD;  Location: [place];  Service: Vascular;  Laterality: Left;   CARDIAC CATHETERIZATION  04-05-2010   checking for blockage but none-[facility]   COLONOSCOPY     CYSTOSCOPY W/ STONE MANIPULATION     "laser once" (01/22/2013)   HEMATOMA EVACUATION Left 05/09/2017   Procedure: EVACUATION HEMATOMA LEFT ARM;  Surgeon: Conrad Bellevue, MD;  Location: [place];  Service: Vascular;  Laterality: Left;   HERNIA REPAIR     INGUINAL HERNIA REPAIR Right 11/06/2015   Procedure: OPEN HERNIA REPAIR  RIGHT INGUINAL ADULT;  Surgeon: [name], MD;  Location: [facility];  Service: General;  Laterality: Right;  with MESH   INSERTION OF DIALYSIS CATHETER Right 10/05/2016   Procedure: INSERTION OF right internal jugular DIALYSIS CATHETER;  Surgeon: [name], MD;  Location: [place];  Service: Vascular;  Laterality: Right;   INSERTION OF MESH  03/20/2012   Procedure: INSERTION OF MESH;  Danella Maiers Surgeon: [name], MD;  Location: [place];  Service: General;  Laterality: N/A;   INSERTION OF MESH N/A 01/22/2013   Procedure: INSERTION OF MESH;  Surgeon: [name], MD;  Location: [place];  Service: General;  Laterality: N/A;   LAPAROTOMY  04/02/2012   Procedure: EXPLORATORY LAPAROTOMY;  Surgeon: [name], MD;  Location: [place];  Service: General;  Laterality: N/A;  Exploratory Laparotomy with resection of small intestine   LEFT HEART CATHETERIZATION WITH CORONARY ANGIOGRAM N/A 05/14/2013   Procedure: LEFT HEART CATHETERIZATION WITH CORONARY ANGIOGRAM;  Surgeon: Sinclair Grooms, MD;  Location: [facility];  Service: Cardiovascular;   Laterality: N/A;   LIGATION OF ARTERIOVENOUS  FISTULA Left 12/27/2016   Procedure: LIGATION/EXCISION OF LEFT UPPER ARM ARTERIOVENOUS  FISTULA;  EVACUATION OF HEMATOMA;  Surgeon: Conrad Bastrop, MD;  Location: [place];  Service: Vascular;  Laterality: Left;   LIGATION OF ARTERIOVENOUS  FISTULA Left 03/07/2019   Procedure: LIGATION OF ARTERIOVENOUS FISTULA  LEFT UPPER ARM;  Surgeon: Marty Heck, MD;  Location: [place];  Service: Vascular;  Laterality: Left;   REVISON OF ARTERIOVENOUS FISTULA Left 10/05/2016   Procedure: REVISON OF left arm ARTERIOVENOUS FISTULA;  Surgeon: [name], MD;  Location: [place];  Service: Vascular;  Laterality: Left;   TONSILLECTOMY     UMBILICAL HERNIA REPAIR  03/20/2012   Procedure: HERNIA REPAIR UMBILICAL ADULT;  Surgeon: [name], MD;  Location: [place];  Service: General;  Laterality: N/A;   UMBILICAL HERNIA REPAIR  01/22/2013  preperitoneal open procedure due to significant adhesions/notes 01/22/2013   VENTRAL HERNIA REPAIR N/A 01/22/2013   Procedure: ATTEMPTED LAPAROSCOPIC VENTRAL HERNIA CONVERTED TO OPEN;  Surgeon: [name], MD;  Location: [place];  Service: General;  Laterality: N/A;  :   Current Outpatient Medications:    acyclovir (ZOVIRAX) 400 MG tablet, Take 1 tablet (400 mg total) by mouth daily with lunch. (Patient not taking: No sig reported), Disp: 30 tablet, Rfl: 1   apixaban (ELIQUIS) 5 MG TABS tablet, Take 1 tablet (5 mg total) by mouth 2 (two) times daily. (Patient not taking: No sig reported), Disp: 180 tablet, Rfl: 3   aspirin EC 81 MG EC tablet, Take 1 tablet (81 mg total) by mouth daily., Disp: , Rfl:    AURYXIA 1 GM 210 MG(Fe) tablet, Take 210-630 mg by mouth See admin instructions. Take 630 mg by mouth three times a day with meals and 210 mg twice a day with snacks, Disp: , Rfl:    cinacalcet (SENSIPAR) 30 MG tablet, Take 30 mg by mouth daily., Disp: , Rfl:    clotrimazole-betamethasone (LOTRISONE) cream, Apply 1 application  topically 2 (two) times daily., Disp: 45 g, Rfl: 2   dorzolamide-timolol (COSOPT) 22.3-6.8 MG/ML ophthalmic solution, Place 1 drop into both eyes 2 (two) times daily., Disp: , Rfl:    doxycycline (VIBRAMYCIN) 100 MG capsule, Take 1 capsule (100 mg total) by mouth 2 (two) times daily., Disp: 14 capsule, Rfl: 0   nitroGLYCERIN (NITROSTAT) 0.4 MG SL tablet, Place 1 tablet (0.4 mg total) under the tongue every 5 (five) minutes x 3 doses as needed for chest pain., Disp: 30 tablet, Rfl: 12   prednisoLONE acetate (PRED FORTE) 1 % ophthalmic suspension, Place 1 drop into the right eye 2 (two) times daily., Disp: , Rfl:    rosuvastatin (CRESTOR) 20 MG tablet, Take 20 mg by mouth daily., Disp: , Rfl:    sevelamer carbonate (RENVELA) 800 MG tablet, Take 2 tablets (1,600 mg total) by mouth 3 (three) times daily with meals., Disp: 180 tablet, Rfl: 0   terbinafine (LAMISIL) 250 MG tablet, Take 1 tablet (250 mg total) by mouth daily., Disp: 30 tablet, Rfl: 1:  No Known Allergies:   Family History  Problem Relation Age of Onset   Heart disease Mother    Hyperlipidemia Mother    Hypertension Mother    Kidney disease Father    Stroke Father    Kidney disease Brother    Amblyopia Neg Hx    Blindness Neg Hx    Cataracts Neg Hx    Diabetes Neg Hx    Glaucoma Neg Hx    Macular degeneration Neg Hx    Retinal detachment Neg Hx    Strabismus Neg Hx    Retinitis pigmentosa Neg Hx    Breast cancer Neg Hx    Prostate cancer Neg Hx    Colon cancer Neg Hx    Pancreatic cancer Neg Hx   :   Social History   Socioeconomic History   Marital status: Married    Spouse name: [name]   Number of children: 3   Years of education: 12   Highest education level: Not on file  Occupational History    Comment: disabled  Tobacco Use   Smoking status: Never   Smokeless tobacco: Never  Vaping Use   Vaping Use: Never used  Substance and Sexual Activity   Alcohol use: No    Alcohol/week: 0.0 standard drinks   Drug  use: No  Sexual activity: Yes  Other Topics Concern   Not on file  Social History Narrative   Patient is married [place]) and lives at home with his wife and children.   Patient has three children.   Patient is in disability.   Patient has a high school education.   Patient is right-handed   Patient drinks very little soda.            Social Determinants of Health   Financial Resource Strain: Not on file  Food Insecurity: Not on file  Transportation Needs: Not on file  Physical Activity: Not on file  Stress: Not on file  Social Connections: Not on file  Intimate Partner Violence: Not on file  :  Pertinent items are noted in HPI.  Exam: Blood pressure 116/83, pulse 89, temperature 98 F (36.7 C), temperature source Oral, resp. rate 17, height 5\' 7"  (1.702 m), weight 190 lb 3.2 oz (86.3 kg), SpO2 100 %.   ECOG 1 General appearance: alert and cooperative appeared without distress. Head: atraumatic without any abnormalities. Eyes: conjunctivae/corneas clear. PERRL.  Sclera anicteric. Throat: lips, mucosa, and tongue normal; without oral thrush or ulcers. Resp: clear to auscultation bilaterally without rhonchi, wheezes or dullness to percussion. Cardio: regular rate and rhythm, S1, S2 normal, no murmur, click, rub or gallop GI: soft, non-tender; bowel sounds normal; no masses,  no organomegaly Skin: Skin color, texture, turgor normal. No rashes or lesions Lymph nodes: Cervical, supraclavicular, and axillary nodes normal. Neurologic: Grossly normal without any motor, sensory or deep tendon reflexes. Musculoskeletal: No joint deformity or effusion.    Assessment and Plan:   65 year old with:  1.  Polycythemia with fluctuating hemoglobin between 16 and 17 and hematocrit over 50 noted since April 2022.  He has normal white cell count and platelet count.  The differential diagnosis of these findings were reviewed at this time.  Secondary causes of polycythemia including  smoking, carbon monoxide exposure, hormone exposure among other factors were reiterated.   He does have a history of obstructive sleep apnea although he is not on CPAP.  He has required supplemental oxygen during dialysis.  Chronic low oxygen state could be contributing to his polycythemia.  Polycythemia vera myeloproliferative disorder should also be evaluated.  The plan after today is to obtain JAK2 mutation and myeloproliferative molecular panel.  I do not see any immediate or urgent need for phlebotomy at this time.  Given his hemodialysis the likelihood of developing iron deficiency related to that.  I have recommended continued monitoring for the time being and institute phlebotomy at a higher threshold if needed.  This will be pending the work-up for polycythemia vera.  2.  Thrombosis prophylaxis: He is currently on Eliquis and thrombosis risk is low.  3.  Prostate cancer: He presented with stage T1c Gleason score 7 localized disease.  He received definitive therapy completed in March 2022.  He is following with Dr. Lovena Neighbours without any relapsed disease.  4.  Follow-up: Will be in 25months for repeat evaluation pending his work-up today.  60  minutes were dedicated to this visit. The time was spent on reviewing laboratory data, discussing his diagnosis of cancer and hematological abnormalities, discussing treatment options, discussing differential diagnosis and answering questions regarding future plan.    A copy of this consult has been forwarded to the requesting physician.

## 2021-01-22 DIAGNOSIS — N2581 Secondary hyperparathyroidism of renal origin: Secondary | ICD-10-CM | POA: Diagnosis not present

## 2021-01-22 DIAGNOSIS — N186 End stage renal disease: Secondary | ICD-10-CM | POA: Diagnosis not present

## 2021-01-22 DIAGNOSIS — Z992 Dependence on renal dialysis: Secondary | ICD-10-CM | POA: Diagnosis not present

## 2021-01-22 LAB — ERYTHROPOIETIN: Erythropoietin: 42.1 m[IU]/mL — ABNORMAL HIGH (ref 2.6–18.5)

## 2021-01-25 DIAGNOSIS — N2581 Secondary hyperparathyroidism of renal origin: Secondary | ICD-10-CM | POA: Diagnosis not present

## 2021-01-25 DIAGNOSIS — N186 End stage renal disease: Secondary | ICD-10-CM | POA: Diagnosis not present

## 2021-01-25 DIAGNOSIS — Z992 Dependence on renal dialysis: Secondary | ICD-10-CM | POA: Diagnosis not present

## 2021-01-27 DIAGNOSIS — N2581 Secondary hyperparathyroidism of renal origin: Secondary | ICD-10-CM | POA: Diagnosis not present

## 2021-01-27 DIAGNOSIS — N186 End stage renal disease: Secondary | ICD-10-CM | POA: Diagnosis not present

## 2021-01-27 DIAGNOSIS — Z992 Dependence on renal dialysis: Secondary | ICD-10-CM | POA: Diagnosis not present

## 2021-01-29 DIAGNOSIS — N2581 Secondary hyperparathyroidism of renal origin: Secondary | ICD-10-CM | POA: Diagnosis not present

## 2021-01-29 DIAGNOSIS — N186 End stage renal disease: Secondary | ICD-10-CM | POA: Diagnosis not present

## 2021-01-29 DIAGNOSIS — Z992 Dependence on renal dialysis: Secondary | ICD-10-CM | POA: Diagnosis not present

## 2021-01-29 LAB — JAK2 (INCLUDING V617F AND EXON 12), MPL,& CALR-NEXT GEN SEQ

## 2021-01-30 DIAGNOSIS — N186 End stage renal disease: Secondary | ICD-10-CM | POA: Diagnosis not present

## 2021-01-30 DIAGNOSIS — Q612 Polycystic kidney, adult type: Secondary | ICD-10-CM | POA: Diagnosis not present

## 2021-01-30 DIAGNOSIS — Z992 Dependence on renal dialysis: Secondary | ICD-10-CM | POA: Diagnosis not present

## 2021-02-01 DIAGNOSIS — D689 Coagulation defect, unspecified: Secondary | ICD-10-CM | POA: Diagnosis not present

## 2021-02-01 DIAGNOSIS — N186 End stage renal disease: Secondary | ICD-10-CM | POA: Diagnosis not present

## 2021-02-01 DIAGNOSIS — Z992 Dependence on renal dialysis: Secondary | ICD-10-CM | POA: Diagnosis not present

## 2021-02-01 DIAGNOSIS — N2581 Secondary hyperparathyroidism of renal origin: Secondary | ICD-10-CM | POA: Diagnosis not present

## 2021-02-03 DIAGNOSIS — N2581 Secondary hyperparathyroidism of renal origin: Secondary | ICD-10-CM | POA: Diagnosis not present

## 2021-02-03 DIAGNOSIS — N186 End stage renal disease: Secondary | ICD-10-CM | POA: Diagnosis not present

## 2021-02-03 DIAGNOSIS — Z992 Dependence on renal dialysis: Secondary | ICD-10-CM | POA: Diagnosis not present

## 2021-02-03 DIAGNOSIS — D689 Coagulation defect, unspecified: Secondary | ICD-10-CM | POA: Diagnosis not present

## 2021-02-05 DIAGNOSIS — Z23 Encounter for immunization: Secondary | ICD-10-CM | POA: Diagnosis not present

## 2021-02-05 DIAGNOSIS — N2581 Secondary hyperparathyroidism of renal origin: Secondary | ICD-10-CM | POA: Diagnosis not present

## 2021-02-05 DIAGNOSIS — N186 End stage renal disease: Secondary | ICD-10-CM | POA: Diagnosis not present

## 2021-02-05 DIAGNOSIS — D689 Coagulation defect, unspecified: Secondary | ICD-10-CM | POA: Diagnosis not present

## 2021-02-05 DIAGNOSIS — Z992 Dependence on renal dialysis: Secondary | ICD-10-CM | POA: Diagnosis not present

## 2021-02-08 DIAGNOSIS — D689 Coagulation defect, unspecified: Secondary | ICD-10-CM | POA: Diagnosis not present

## 2021-02-08 DIAGNOSIS — Z992 Dependence on renal dialysis: Secondary | ICD-10-CM | POA: Diagnosis not present

## 2021-02-08 DIAGNOSIS — N186 End stage renal disease: Secondary | ICD-10-CM | POA: Diagnosis not present

## 2021-02-08 DIAGNOSIS — N2581 Secondary hyperparathyroidism of renal origin: Secondary | ICD-10-CM | POA: Diagnosis not present

## 2021-02-09 DIAGNOSIS — C61 Malignant neoplasm of prostate: Secondary | ICD-10-CM | POA: Diagnosis not present

## 2021-02-10 DIAGNOSIS — N186 End stage renal disease: Secondary | ICD-10-CM | POA: Diagnosis not present

## 2021-02-10 DIAGNOSIS — Z992 Dependence on renal dialysis: Secondary | ICD-10-CM | POA: Diagnosis not present

## 2021-02-10 DIAGNOSIS — N2581 Secondary hyperparathyroidism of renal origin: Secondary | ICD-10-CM | POA: Diagnosis not present

## 2021-02-10 DIAGNOSIS — D689 Coagulation defect, unspecified: Secondary | ICD-10-CM | POA: Diagnosis not present

## 2021-02-13 DIAGNOSIS — Z992 Dependence on renal dialysis: Secondary | ICD-10-CM | POA: Diagnosis not present

## 2021-02-13 DIAGNOSIS — D689 Coagulation defect, unspecified: Secondary | ICD-10-CM | POA: Diagnosis not present

## 2021-02-13 DIAGNOSIS — N186 End stage renal disease: Secondary | ICD-10-CM | POA: Diagnosis not present

## 2021-02-13 DIAGNOSIS — N2581 Secondary hyperparathyroidism of renal origin: Secondary | ICD-10-CM | POA: Diagnosis not present

## 2021-02-15 DIAGNOSIS — N2581 Secondary hyperparathyroidism of renal origin: Secondary | ICD-10-CM | POA: Diagnosis not present

## 2021-02-15 DIAGNOSIS — Z992 Dependence on renal dialysis: Secondary | ICD-10-CM | POA: Diagnosis not present

## 2021-02-15 DIAGNOSIS — D689 Coagulation defect, unspecified: Secondary | ICD-10-CM | POA: Diagnosis not present

## 2021-02-15 DIAGNOSIS — N186 End stage renal disease: Secondary | ICD-10-CM | POA: Diagnosis not present

## 2021-02-17 DIAGNOSIS — Z992 Dependence on renal dialysis: Secondary | ICD-10-CM | POA: Diagnosis not present

## 2021-02-17 DIAGNOSIS — N186 End stage renal disease: Secondary | ICD-10-CM | POA: Diagnosis not present

## 2021-02-17 DIAGNOSIS — D689 Coagulation defect, unspecified: Secondary | ICD-10-CM | POA: Diagnosis not present

## 2021-02-17 DIAGNOSIS — N2581 Secondary hyperparathyroidism of renal origin: Secondary | ICD-10-CM | POA: Diagnosis not present

## 2021-02-18 DIAGNOSIS — C61 Malignant neoplasm of prostate: Secondary | ICD-10-CM | POA: Diagnosis not present

## 2021-02-19 DIAGNOSIS — D689 Coagulation defect, unspecified: Secondary | ICD-10-CM | POA: Diagnosis not present

## 2021-02-19 DIAGNOSIS — N2581 Secondary hyperparathyroidism of renal origin: Secondary | ICD-10-CM | POA: Diagnosis not present

## 2021-02-19 DIAGNOSIS — N186 End stage renal disease: Secondary | ICD-10-CM | POA: Diagnosis not present

## 2021-02-19 DIAGNOSIS — Z992 Dependence on renal dialysis: Secondary | ICD-10-CM | POA: Diagnosis not present

## 2021-02-22 DIAGNOSIS — N186 End stage renal disease: Secondary | ICD-10-CM | POA: Diagnosis not present

## 2021-02-22 DIAGNOSIS — N2581 Secondary hyperparathyroidism of renal origin: Secondary | ICD-10-CM | POA: Diagnosis not present

## 2021-02-22 DIAGNOSIS — D689 Coagulation defect, unspecified: Secondary | ICD-10-CM | POA: Diagnosis not present

## 2021-02-22 DIAGNOSIS — Z992 Dependence on renal dialysis: Secondary | ICD-10-CM | POA: Diagnosis not present

## 2021-02-24 DIAGNOSIS — N2581 Secondary hyperparathyroidism of renal origin: Secondary | ICD-10-CM | POA: Diagnosis not present

## 2021-02-24 DIAGNOSIS — N186 End stage renal disease: Secondary | ICD-10-CM | POA: Diagnosis not present

## 2021-02-24 DIAGNOSIS — D689 Coagulation defect, unspecified: Secondary | ICD-10-CM | POA: Diagnosis not present

## 2021-02-24 DIAGNOSIS — Z992 Dependence on renal dialysis: Secondary | ICD-10-CM | POA: Diagnosis not present

## 2021-02-26 DIAGNOSIS — Z992 Dependence on renal dialysis: Secondary | ICD-10-CM | POA: Diagnosis not present

## 2021-02-26 DIAGNOSIS — D689 Coagulation defect, unspecified: Secondary | ICD-10-CM | POA: Diagnosis not present

## 2021-02-26 DIAGNOSIS — N186 End stage renal disease: Secondary | ICD-10-CM | POA: Diagnosis not present

## 2021-02-26 DIAGNOSIS — N2581 Secondary hyperparathyroidism of renal origin: Secondary | ICD-10-CM | POA: Diagnosis not present

## 2021-03-01 DIAGNOSIS — Z992 Dependence on renal dialysis: Secondary | ICD-10-CM | POA: Diagnosis not present

## 2021-03-01 DIAGNOSIS — N2581 Secondary hyperparathyroidism of renal origin: Secondary | ICD-10-CM | POA: Diagnosis not present

## 2021-03-01 DIAGNOSIS — N186 End stage renal disease: Secondary | ICD-10-CM | POA: Diagnosis not present

## 2021-03-01 DIAGNOSIS — D689 Coagulation defect, unspecified: Secondary | ICD-10-CM | POA: Diagnosis not present

## 2021-03-02 ENCOUNTER — Other Ambulatory Visit: Payer: Self-pay | Admitting: Cardiology

## 2021-03-02 DIAGNOSIS — N186 End stage renal disease: Secondary | ICD-10-CM | POA: Diagnosis not present

## 2021-03-02 DIAGNOSIS — Z992 Dependence on renal dialysis: Secondary | ICD-10-CM | POA: Diagnosis not present

## 2021-03-02 DIAGNOSIS — Q612 Polycystic kidney, adult type: Secondary | ICD-10-CM | POA: Diagnosis not present

## 2021-03-02 NOTE — Telephone Encounter (Signed)
Pt last saw Dr Cristopher Peru 03/17/20, pt was due to follow-up with Dr Lovena Le in 6 months 07/2020.  Pt is overdue for follow-up recall in Epic. Last labs 11/17/20 Creat 8.59, age 65, weight 86.3kg, based on specified criteria pt is on appropriate dosage of Eliquis 5mg  BID.  Message sent to schedulers to call pt to schedule appt with Dr Lovena Le.  Will await appt to refill rx.

## 2021-03-03 DIAGNOSIS — N2581 Secondary hyperparathyroidism of renal origin: Secondary | ICD-10-CM | POA: Diagnosis not present

## 2021-03-03 DIAGNOSIS — Z992 Dependence on renal dialysis: Secondary | ICD-10-CM | POA: Diagnosis not present

## 2021-03-03 DIAGNOSIS — N186 End stage renal disease: Secondary | ICD-10-CM | POA: Diagnosis not present

## 2021-03-03 NOTE — Progress Notes (Signed)
Cardiology Office Note Date:  03/03/2021  Patient ID:  Michael Dearden., DOB Dec 15, 1955, [name] [id number] PCP:  [name], MD  Cardiologist:  Dr. Meda Coffee (gone) Electrophysiologist: Dr. Lovena Le Nephrology: [facility]    Chief Complaint:  over due 6 mo   History of Present Illness: Michael Harari. is a 65 y.o. male with history of NICM, chronic CHF, VT/cardiac arrest, AFib, PCKD > ESRF on HD, HTN, HLD, OSA w/CPAP  Michael [name] first saw [facility] in 2015 after an admission for symptomatic, sustained VT. Echo at that time showed EF 20 to 25%.  LHC performed which showed normal coronary arteries. He was started on amiodarone with improvement and had no further VT. At follow-up repeat echo showed EF of 25 to 30%.  Nitrates were started. Planned to discuss ICD but patient was lost to follow-up for quite some time.  He was once again see by Dr. Lovena Le 01/2019 for evaluation of a flutter/tachycardia prior to vascular procedure. He ultimately underwent AFlutter ablation 04/2019 with post procedural sinus node dysfunction therefore his BB was held with improvement in his rates by follow up 05/21/2019.    More recently, he was scheduled to have right AV fistula by vascular surgery on 05/29/2019 however prior to incision he had a cardiac arrest in the OR. He became hypotensive and hypoxic. He received 4 minutes of CPR with ROSC.    He was then seen by Dr. Lovena Le 08/07/19 who discussed ICD implantation due to severe cardiomyopathy however this has been  deferred until fistula placed for HD as cathter HD felt to be at higher risk of infection.   He saw cardiology Oct 2021 for pre-op evaluation for his prostate US/biopsy, he was in Afib, planned for Eliquis, timing was TBD after discussion with Dr. Lovena Le, though felt reasonable risk for low risk procedure.  He comes today to be seen for Dr. Lovena Le, last seen by him Nov 2021, he still had tunneled catheter, was on Eliquis Not felt an ICD candidate  given indwelling HD catheter. This was his last EP/cardiology visit   He was subsequently found to have prostate cancer and treated with XRT. He saw oncology 01/21/21 2/2 polycythemia  TODAY He thinks he is doing pretty well. Finished up his XRT and happy about that He still has tunneled HD cath R chest, says he is not interested in AV fistula surgery given events last time. He recalls Dr. Lovena Le discussing that we can not put ICD with the catheter in place and understands, but also thinks his heart may be better, he is feeling better. He tolerates HD well Infrequently he has some mild DOE, this is rare. No near syncope or syncope No CP, palpitations  No bleeding or signs of bleeding  He plans on starting to walk for exercise He is hoping that he can get back in transplant list (2 sisters are matches apparently)   VT Hx ER visit with VT Jan 2015 >> started on amiodarone, ? Stopped 2017 unknown why Feb 2021, arrest inter-op (suspect to have been PEA and primarily a respiratory event)  AFib  Atrial tachycardia, AFlutter Oct 2020  CTI ablation Dec 2020  Past Medical History:  Diagnosis Date   Atrial arrhythmia    atrial tachycardia with variable AV conduction versus atypical aflutter 01/10/19, rate control (02/06/19)   Cataract    Dyspnea    on exertion   ESRD (end stage renal disease) ([place])    Hemo- MWF, Polycystic kidney disease   Fatigue  History of kidney stones    removal of stone- cysto   Hyperlipidemia    Hyperparathyroidism, secondary renal ([place])    Hypertension    Hypoxemia 12/12/2013   Nonischemic cardiomyopathy ([place])    Er 25% 2015, 55 % 2013   OSA on CPAP    no longer using cpap   OSA on CPAP 03/24/2014   Pneumonia    2015ish   Prostate cancer [facility])    Ventricular tachycardia//Freq PVCs    Wears glasses     Past Surgical History:  Procedure Laterality Date   A-FLUTTER ABLATION N/A 04/11/2019   Procedure: A-FLUTTER ABLATION;  Surgeon: Evans Lance, MD;  Location: [place] [facility];  Service: Cardiovascular;  Laterality: N/A;   A/V FISTULAGRAM Left 04/27/2017   Procedure: A/V FISTULAGRAM;  Surgeon: Conrad [place], MD;  Location: [place] [facility];  Service: Cardiovascular;  Laterality: Left;  lt arm   A/V FISTULAGRAM Left 01/10/2019   Procedure: A/V FISTULAGRAM;  Surgeon: Marty Heck, MD;  Location: [place] [facility];  Service: Cardiovascular;  Laterality: Left;   APPENDECTOMY     AV FISTULA PLACEMENT  12/05/2011   Procedure: ARTERIOVENOUS (AV) FISTULA CREATION;LLEFT ARM  Surgeon: Conrad Austin, MD;  Location: [place];  Service: Vascular;  Laterality: Left;  RADIO-CEPHALIC  fistula left arm   AV FISTULA PLACEMENT  01/11/2012   Procedure: ARTERIOVENOUS (AV) FISTULA CREATION;  Surgeon: Conrad [place], MD;  Location: [place];  Service: Vascular;  Laterality: Left;  Creation of left brachial cephalic arteriovenous fistula   AV FISTULA PLACEMENT Right 03/07/2019   Procedure: ARTERIOVENOUS (AV) FISTULA CREATION  RIGHT ARM;  Surgeon: Marty Heck, MD;  Location: [place];  Service: Vascular;  Laterality: Right;   [place] Left 12/27/2016   Procedure: BASILIC VEIN TRANSPOSITION LEFT UPPER ARM FIRST STAGE;  Surgeon: Conrad [place], MD;  Location: [place];  Service: Vascular;  Laterality: Left;   [place] Left 01/31/2017   Procedure: LEFT ARM BASILIC VEIN TRANSPOSITION, SECOND STAGE;  Surgeon: Conrad [place], MD;  Location: [place];  Service: Vascular;  Laterality: Left;   CARDIAC CATHETERIZATION  04-05-2010   checking for blockage but none-[facility]   COLONOSCOPY     CYSTOSCOPY W/ STONE MANIPULATION     "laser once" (01/22/2013)   HEMATOMA EVACUATION Left 05/09/2017   Procedure: EVACUATION HEMATOMA LEFT ARM;  Surgeon: Conrad [place], MD;  Location: [place];  Service: Vascular;  Laterality: Left;   HERNIA REPAIR     INGUINAL HERNIA REPAIR Right 11/06/2015   Procedure: OPEN HERNIA REPAIR  RIGHT INGUINAL ADULT;  Surgeon:  [name], MD;  Location: [facility];  Service: General;  Laterality: Right;  with MESH   INSERTION OF DIALYSIS CATHETER Right 10/05/2016   Procedure: INSERTION OF right internal jugular DIALYSIS CATHETER;  Surgeon: [name], MD;  Location: [place];  Service: Vascular;  Laterality: Right;   INSERTION OF MESH  03/20/2012   Procedure: INSERTION OF MESH;  Danella Maiers Surgeon: [name], MD;  Location: [place];  Service: General;  Laterality: N/A;   INSERTION OF MESH N/A 01/22/2013   Procedure: INSERTION OF MESH;  Surgeon: [name], MD;  Location: [place];  Service: General;  Laterality: N/A;   LAPAROTOMY  04/02/2012   Procedure: EXPLORATORY LAPAROTOMY;  Surgeon: [name], MD;  Location: [place];  Service: General;  Laterality: N/A;  Exploratory Laparotomy with resection of small intestine   LEFT HEART CATHETERIZATION WITH CORONARY  ANGIOGRAM N/A 05/14/2013   Procedure: LEFT HEART CATHETERIZATION WITH CORONARY ANGIOGRAM;  Surgeon: Sinclair Grooms, MD;  Location: [facility];  Service: Cardiovascular;  Laterality: N/A;   LIGATION OF ARTERIOVENOUS  FISTULA Left 12/27/2016   Procedure: LIGATION/EXCISION OF LEFT UPPER ARM ARTERIOVENOUS  FISTULA;  EVACUATION OF HEMATOMA;  Surgeon: Conrad [place], MD;  Location: [place];  Service: Vascular;  Laterality: Left;   LIGATION OF ARTERIOVENOUS  FISTULA Left 03/07/2019   Procedure: LIGATION OF ARTERIOVENOUS FISTULA  LEFT UPPER ARM;  Surgeon: Marty Heck, MD;  Location: [place];  Service: Vascular;  Laterality: Left;   REVISON OF ARTERIOVENOUS FISTULA Left 10/05/2016   Procedure: REVISON OF left arm ARTERIOVENOUS FISTULA;  Surgeon: [name], MD;  Location: [place];  Service: Vascular;  Laterality: Left;   TONSILLECTOMY     UMBILICAL HERNIA REPAIR  03/20/2012   Procedure: HERNIA REPAIR UMBILICAL ADULT;  Surgeon: [name], MD;  Location: [place];  Service: General;  Laterality: N/A;   UMBILICAL HERNIA REPAIR  01/22/2013   preperitoneal open  procedure due to significant adhesions/notes 01/22/2013   VENTRAL HERNIA REPAIR N/A 01/22/2013   Procedure: ATTEMPTED LAPAROSCOPIC VENTRAL HERNIA CONVERTED TO OPEN;  Surgeon: [name], MD;  Location: [place];  Service: General;  Laterality: N/A;    Current Outpatient Medications  Medication Sig Dispense Refill   acyclovir (ZOVIRAX) 400 MG tablet Take 1 tablet (400 mg total) by mouth daily with lunch. 30 tablet 1   apixaban (ELIQUIS) 5 MG TABS tablet Take 1 tablet (5 mg total) by mouth 2 (two) times daily. 180 tablet 3   aspirin EC 81 MG EC tablet Take 1 tablet (81 mg total) by mouth daily.     atorvastatin (LIPITOR) 40 MG tablet Take 40 mg by mouth at bedtime.     AURYXIA 1 GM 210 MG(Fe) tablet Take 210-630 mg by mouth See admin instructions. Take 630 mg by mouth three times a day with meals and 210 mg twice a day with snacks     cinacalcet (SENSIPAR) 30 MG tablet Take 30 mg by mouth 3 (three) times a week.     clotrimazole-betamethasone (LOTRISONE) cream Apply 1 application topically 2 (two) times daily. (Patient not taking: Reported on 01/21/2021) 45 g 2   dorzolamide-timolol (COSOPT) 22.3-6.8 MG/ML ophthalmic solution Place 1 drop into both eyes 2 (two) times daily.     doxycycline (VIBRAMYCIN) 100 MG capsule Take 1 capsule (100 mg total) by mouth 2 (two) times daily. 14 capsule 0   nitroGLYCERIN (NITROSTAT) 0.4 MG SL tablet Place 1 tablet (0.4 mg total) under the tongue every 5 (five) minutes x 3 doses as needed for chest pain. (Patient not taking: Reported on 01/21/2021) 30 tablet 12   prednisoLONE acetate (PRED FORTE) 1 % ophthalmic suspension Place 1 drop into the right eye 2 (two) times daily.     rosuvastatin (CRESTOR) 20 MG tablet Take 20 mg by mouth daily.     sevelamer carbonate (RENVELA) 800 MG tablet Take 2 tablets (1,600 mg total) by mouth 3 (three) times daily with meals. 180 tablet 0   terbinafine (LAMISIL) 250 MG tablet Take 1 tablet (250 mg total) by mouth daily. (Patient  not taking: Reported on 01/21/2021) 30 tablet 1   No current facility-administered medications for this visit.    Allergies:   Patient has no known allergies.   Social History:  The patient  reports that he has never smoked. He has never used smokeless tobacco.  He reports that he does not drink alcohol and does not use drugs.   Family History:  The patient's family history includes Heart disease in his mother; Hyperlipidemia in his mother; Hypertension in his mother; Kidney disease in his brother and father; Stroke in his father.  ROS:  Please see the history of present illness.    All other systems are reviewed and otherwise negative.   PHYSICAL EXAM:  VS:  There were no vitals taken for this visit. BMI: There is no height or weight on file to calculate BMI. Well nourished, well developed, in no acute distress HEENT: normocephalic, atraumatic Neck: no JVD, carotid bruits or masses Cardiac:  RRR; no significant murmurs, no rubs, or gallops Lungs:  CTA b/l, no wheezing, rhonchi or rales Abd: soft, nontender MS: no deformity or atrophy Ext: 1+ edema to mid shin Skin: warm and dry, no rash Neuro:  No gross deficits appreciated Psych: euthymic mood, full affect    EKG:  Done 11/17/20 and reviewed by myself shows  AFib 77bpm, IVCD, LAD, , coupletPVCs    Event Monitor Summary 05/23/2019  1. NSR with sinus bradycardia 2. PAF with RVR and CVR 3. NSVT 4. No prolonged pauses  Michael [name],M.D.   Echocardiogram 06/04/2019 1. Left ventricular ejection fraction, by visual estimation, is <20%. The left ventricle has severely decreased function. There is no left ventricular hypertrophy. 2. Severely dilated left ventricular internal cavity size. 3. The left ventricle demonstrates global hypokinesis. 4. Left ventricular diastolic parameters are consistent with Grade II diastolic dysfunction (pseudonormalization). 5. Global right ventricle has normal systolicnction.The right ventricular  size is normal. No increase in right ventricular wall thickness. 6. Left atrial size was severely dilated. 7. Right atrial size was severely dilated. 8. The mitral valve is normal in structure. Trivial mitral valve regurgitation. No evidence of mitral stenosis. 9. The tricuspid valve is normal in structure. Tricuspid valve regurgitation is mild. 10. The aortic valve is tricuspid. Aortic valve regurgitation is mild. Moderate aortic valve sclerosis/calcification without any evidence of aortic stenosis. 11. The pulmonic valve was normal in structure. Pulmonic valve regurgitation is not visualized. 12. There is mild dilatation of the aortic root measuring 41 mm. 13. Normal pulmonary artery systolic pressure. 14. The inferior vena cava is dilated in size with >50% respiratory variability, suggesting right atrial pressure of 8 mmHg. 15. The tricuspid regurgitant velocity is 2.04 m/s, and with an assumed right atrial pressure of 57mmHg, the estimated right ventricular systolic pressure is normal at 24.6 mmHg. 16. Small pericardial effusion. 17. The pericardial effusion is posterior to the left ventricle    Atrial flutter catheter ablation 04/11/2019 CONCLUSIONS:  1. Isthmus-dependent right atrial flutter upon presentation.  2. Successful radiofrequency ablation of atrial flutter along the cavotricuspid isthmus with complete bidirectional isthmus block achieved.  3. No inducible arrhythmias following ablation.  4. No early apparent complications.   Michael Peru, MD    Echo 07/23/13:  - Left ventricle: The cavity size was mildly dilated. Wall thickness was normal. Systolic function was severely reduced. The estimated ejection fraction was in the range of 25% to 30%. There is akinesis of the posterior myocardium. There is severe hypokinesis of the lateral myocardium. There is hypokinesis of the anterior myocardium. Doppler parameters are consistent with abnormal left ventricular relaxation (grade 1  diastolic dysfunction). - Aortic valve: Mild regurgitation. - Aortic root: The aortic root was mildly dilated. - Left atrium: The atrium was mildly dilated.   Cardiac cath 05/14/13:  1. Nonischemic cardiomyopathy  with reduced EF less than 25% and severely elevated left ventricular end-diastolic pressures 2. Widely patent coronary arteries     Recent Labs: 08/19/2020: ALT 10 11/17/2020: B Natriuretic Peptide 2,991.4; BUN 25; Creatinine, Ser 8.59; Potassium 3.6; Sodium 140 01/21/2021: Hemoglobin 15.6; Platelet Count 115  No results found for requested labs within last 8760 hours.   CrCl cannot be calculated (Patient's most recent lab result is older than the maximum 21 days allowed.).   Wt Readings from Last 3 Encounters:  01/21/21 190 lb 3.2 oz (86.3 kg)  11/17/20 189 lb (85.7 kg)  08/19/20 188 lb (85.3 kg)     Other studies reviewed: Additional studies/records reviewed today include: summarized above  ASSESSMENT AND PLAN:  Persistent AFib Last SR EKG I find is Feb 2021 CHA2DS@Vasc  is 2, on Eliquis, appropriately dosed for age/weight No symptoms    NICM Chronic CHF (systolic) Volume with HD I think he will benefit from having one of our general cardiogist manage him Will update his echo Plan to have him get established with Dr. Ali Lowe  Hx of VT No symptoms or syncope PVCs, couplet on his EKGs BB stopped historically 2/2 bradycardia No AAD currently  HTN Looks good  Disposition: F/u with EP in 65mo, sooner if needed  Current medicines are reviewed at length with the patient today.  The patient did not have any concerns regarding medicines.  Venetia Night, PA-C 03/03/2021 2:23 PM     [address]  [place] [address] [phone] (office)  [phone] (fax)

## 2021-03-04 ENCOUNTER — Ambulatory Visit (INDEPENDENT_AMBULATORY_CARE_PROVIDER_SITE_OTHER): Payer: Medicare Other | Admitting: Physician Assistant

## 2021-03-04 ENCOUNTER — Other Ambulatory Visit: Payer: Self-pay

## 2021-03-04 ENCOUNTER — Encounter: Payer: Self-pay | Admitting: Physician Assistant

## 2021-03-04 VITALS — BP 120/66 | HR 50 | Ht 67.0 in | Wt 193.6 lb

## 2021-03-04 DIAGNOSIS — I5022 Chronic systolic (congestive) heart failure: Secondary | ICD-10-CM | POA: Diagnosis not present

## 2021-03-04 DIAGNOSIS — I428 Other cardiomyopathies: Secondary | ICD-10-CM

## 2021-03-04 DIAGNOSIS — I10 Essential (primary) hypertension: Secondary | ICD-10-CM

## 2021-03-04 DIAGNOSIS — I4819 Other persistent atrial fibrillation: Secondary | ICD-10-CM | POA: Diagnosis not present

## 2021-03-04 DIAGNOSIS — I472 Ventricular tachycardia, unspecified: Secondary | ICD-10-CM | POA: Diagnosis not present

## 2021-03-04 NOTE — Patient Instructions (Signed)
Medication Instructions:   Your physician recommends that you continue on your current medications as directed. Please refer to the Current Medication list given to you today.  *If you need a refill on your cardiac medications before your next appointment, please call your pharmacy*   [facility]    If you have labs (blood work) drawn today and your tests are completely normal, you will receive your results only by: [place] (if you have MyChart) OR A paper copy in the mail If you have any lab test that is abnormal or we need to change your treatment, we will call you to review the results.   Testing/Procedures: Your physician has requested that you have an echocardiogram. Echocardiography is a painless test that uses sound waves to create images of your heart. It provides your doctor with information about the size and shape of your heart and how well your heart's chambers and valves are working. This procedure takes approximately one hour. There are no restrictions for this procedure.     Follow-Up: At [facility], you and your health needs are our priority.  As part of our continuing mission to provide you with exceptional heart care, we have created designated Provider Care Teams.  These Care Teams include your primary Cardiologist (physician) and Advanced Practice Providers (APPs -  Physician Assistants and Nurse Practitioners) who all work together to provide you with the care you need, when you need it.  We recommend signing up for the patient portal called "MyChart".  Sign up information is provided on this After Visit Summary.  MyChart is used to connect with patients for Virtual Visits (Telemedicine).  Patients are able to view lab/test results, encounter notes, upcoming appointments, etc.  Non-urgent messages can be sent to your provider as well.   To learn more about what you can do with MyChart, go to [email or web address].    Your next  appointment:  AFTER ECHOCARDIOGRAM WITH DR Ali Lowe ESTABLISH NEW CARDIOLOGIST    6 month(s)  The format for your next appointment:   In Person  Provider:   Cristopher Peru, MD   Other Instructions

## 2021-03-05 DIAGNOSIS — N186 End stage renal disease: Secondary | ICD-10-CM | POA: Diagnosis not present

## 2021-03-05 DIAGNOSIS — Z992 Dependence on renal dialysis: Secondary | ICD-10-CM | POA: Diagnosis not present

## 2021-03-05 DIAGNOSIS — N2581 Secondary hyperparathyroidism of renal origin: Secondary | ICD-10-CM | POA: Diagnosis not present

## 2021-03-05 NOTE — Telephone Encounter (Signed)
Prescription refill request for Eliquis received. Indication: Aflutter Last office visit: 03/04/21 Michael [name])  Scr: 8.59 Age: 65 Weight: 87.8kg  Appropriate dose and refill sent to requested pharmacy.

## 2021-03-08 DIAGNOSIS — N186 End stage renal disease: Secondary | ICD-10-CM | POA: Diagnosis not present

## 2021-03-08 DIAGNOSIS — N2581 Secondary hyperparathyroidism of renal origin: Secondary | ICD-10-CM | POA: Diagnosis not present

## 2021-03-08 DIAGNOSIS — Z992 Dependence on renal dialysis: Secondary | ICD-10-CM | POA: Diagnosis not present

## 2021-03-10 DIAGNOSIS — N2581 Secondary hyperparathyroidism of renal origin: Secondary | ICD-10-CM | POA: Diagnosis not present

## 2021-03-10 DIAGNOSIS — N186 End stage renal disease: Secondary | ICD-10-CM | POA: Diagnosis not present

## 2021-03-10 DIAGNOSIS — Z992 Dependence on renal dialysis: Secondary | ICD-10-CM | POA: Diagnosis not present

## 2021-03-12 DIAGNOSIS — N186 End stage renal disease: Secondary | ICD-10-CM | POA: Diagnosis not present

## 2021-03-12 DIAGNOSIS — N2581 Secondary hyperparathyroidism of renal origin: Secondary | ICD-10-CM | POA: Diagnosis not present

## 2021-03-12 DIAGNOSIS — Z992 Dependence on renal dialysis: Secondary | ICD-10-CM | POA: Diagnosis not present

## 2021-03-15 DIAGNOSIS — Z992 Dependence on renal dialysis: Secondary | ICD-10-CM | POA: Diagnosis not present

## 2021-03-15 DIAGNOSIS — N2581 Secondary hyperparathyroidism of renal origin: Secondary | ICD-10-CM | POA: Diagnosis not present

## 2021-03-15 DIAGNOSIS — N186 End stage renal disease: Secondary | ICD-10-CM | POA: Diagnosis not present

## 2021-03-17 DIAGNOSIS — N186 End stage renal disease: Secondary | ICD-10-CM | POA: Diagnosis not present

## 2021-03-17 DIAGNOSIS — N2581 Secondary hyperparathyroidism of renal origin: Secondary | ICD-10-CM | POA: Diagnosis not present

## 2021-03-17 DIAGNOSIS — Z992 Dependence on renal dialysis: Secondary | ICD-10-CM | POA: Diagnosis not present

## 2021-03-18 ENCOUNTER — Ambulatory Visit (HOSPITAL_COMMUNITY): Payer: Medicare Other | Attending: Cardiology

## 2021-03-19 ENCOUNTER — Encounter (HOSPITAL_COMMUNITY): Payer: Self-pay | Admitting: Physician Assistant

## 2021-03-19 DIAGNOSIS — Z992 Dependence on renal dialysis: Secondary | ICD-10-CM | POA: Diagnosis not present

## 2021-03-19 DIAGNOSIS — N2581 Secondary hyperparathyroidism of renal origin: Secondary | ICD-10-CM | POA: Diagnosis not present

## 2021-03-19 DIAGNOSIS — N186 End stage renal disease: Secondary | ICD-10-CM | POA: Diagnosis not present

## 2021-03-19 DIAGNOSIS — Z23 Encounter for immunization: Secondary | ICD-10-CM | POA: Diagnosis not present

## 2021-03-22 DIAGNOSIS — Z992 Dependence on renal dialysis: Secondary | ICD-10-CM | POA: Diagnosis not present

## 2021-03-22 DIAGNOSIS — N186 End stage renal disease: Secondary | ICD-10-CM | POA: Diagnosis not present

## 2021-03-22 DIAGNOSIS — N2581 Secondary hyperparathyroidism of renal origin: Secondary | ICD-10-CM | POA: Diagnosis not present

## 2021-03-24 DIAGNOSIS — Z992 Dependence on renal dialysis: Secondary | ICD-10-CM | POA: Diagnosis not present

## 2021-03-24 DIAGNOSIS — N186 End stage renal disease: Secondary | ICD-10-CM | POA: Diagnosis not present

## 2021-03-24 DIAGNOSIS — N2581 Secondary hyperparathyroidism of renal origin: Secondary | ICD-10-CM | POA: Diagnosis not present

## 2021-03-27 DIAGNOSIS — Z992 Dependence on renal dialysis: Secondary | ICD-10-CM | POA: Diagnosis not present

## 2021-03-27 DIAGNOSIS — N2581 Secondary hyperparathyroidism of renal origin: Secondary | ICD-10-CM | POA: Diagnosis not present

## 2021-03-27 DIAGNOSIS — N186 End stage renal disease: Secondary | ICD-10-CM | POA: Diagnosis not present

## 2021-03-29 DIAGNOSIS — N186 End stage renal disease: Secondary | ICD-10-CM | POA: Diagnosis not present

## 2021-03-29 DIAGNOSIS — Z992 Dependence on renal dialysis: Secondary | ICD-10-CM | POA: Diagnosis not present

## 2021-03-29 DIAGNOSIS — N2581 Secondary hyperparathyroidism of renal origin: Secondary | ICD-10-CM | POA: Diagnosis not present

## 2021-03-31 DIAGNOSIS — N186 End stage renal disease: Secondary | ICD-10-CM | POA: Diagnosis not present

## 2021-03-31 DIAGNOSIS — N2581 Secondary hyperparathyroidism of renal origin: Secondary | ICD-10-CM | POA: Diagnosis not present

## 2021-03-31 DIAGNOSIS — Z992 Dependence on renal dialysis: Secondary | ICD-10-CM | POA: Diagnosis not present

## 2021-03-31 NOTE — Progress Notes (Signed)
Cardiology Office Note:    Date:  04/01/2021   ID:  Michael Humphrey., DOB 02/12/1956, [name] [id number]  PCP:  [name], MD  Cardiologist:  Ena Dawley, MD (Inactive)  Electrophysiologist:  Cristopher Peru, MD   Referring MD: [name], MD   Chief Complaint/Reason for Referral: Establish cardiovascular care  History of Present Illness:    PROBLEM LIST: 1.  Nonischemic cardiomyopathy with ejection fraction of 20%; not a candidate for an ICD due to infection risk from indwelling hemodialysis catheter 2.  End-stage renal disease on hemodialysis 3.  Atrial flutter status post ablation; persistent atrial fibrillation on anticoagulation 4.  Sustained ventricular tachycardia 2015 coronary angiography demonstrated coronary artery disease 5.  Hypertension   Michael Fleming. is a 65 y.o. male with the indicated history here to establish cardiovascular care.  The patient had been seen by the EP division recently was doing fairly well.  Of note he is not a candidate for an ICD due to his indwelling hemodialysis catheter and infection risk associated with this.  The patient is doing relatively well.  He denies any exertional dyspnea, angina, presyncope, syncope, or significant palpitations.  On the mornings when he needs dialysis he will be mildly short of breath and have some mild peripheral edema.  He tells me that at times he will be sleeping and feel somewhat short of breath.  He had previously had a sleep study and is wondering whether he needs nocturnal oxygen.  Past Medical History:  Diagnosis Date   Atrial arrhythmia    atrial tachycardia with variable AV conduction versus atypical aflutter 01/10/19, rate control (02/06/19)   Cataract    Dyspnea    on exertion   ESRD (end stage renal disease) ([place])    Hemo- MWF, Polycystic kidney disease   Fatigue    History of kidney stones    removal of stone- cysto   Hyperlipidemia    Hyperparathyroidism, secondary renal ([place])     Hypertension    Hypoxemia 12/12/2013   Nonischemic cardiomyopathy ([place])    Er 25% 2015, 55 % 2013   OSA on CPAP    no longer using cpap   OSA on CPAP 03/24/2014   Pneumonia    2015ish   Prostate cancer ([place])    Ventricular tachycardia//Freq PVCs    Wears glasses     Past Surgical History:  Procedure Laterality Date   A-FLUTTER ABLATION N/A 04/11/2019   Procedure: A-FLUTTER ABLATION;  Surgeon: Evans Lance, MD;  Location: [place];  Service: Cardiovascular;  Laterality: N/A;   A/V FISTULAGRAM Left 04/27/2017   Procedure: A/V FISTULAGRAM;  Surgeon: Conrad Ducor, MD;  Location: [facility];  Service: Cardiovascular;  Laterality: Left;  lt arm   A/V FISTULAGRAM Left 01/10/2019   Procedure: A/V FISTULAGRAM;  Surgeon: Marty Heck, MD;  Location: [facility];  Service: Cardiovascular;  Laterality: Left;   APPENDECTOMY     AV FISTULA PLACEMENT  12/05/2011   Procedure: ARTERIOVENOUS (AV) FISTULA CREATION;LLEFT ARM  Surgeon: Conrad [place], MD;  Location: [facility];  Service: Vascular;  Laterality: Left;  RADIO-CEPHALIC  fistula left arm   AV FISTULA PLACEMENT  01/11/2012   Procedure: ARTERIOVENOUS (AV) FISTULA CREATION;  Surgeon: Conrad [place]burg, MD;  Location: [facility] [facility];  Service: Vascular;  Laterality: Left;  Creation of left brachial cephalic arteriovenous fistula   AV FISTULA PLACEMENT Right 03/07/2019   Procedure: ARTERIOVENOUS (AV) FISTULA CREATION  RIGHT ARM;  Surgeon: Carlis Abbott,  Gwenyth Allegra, MD;  Location: [place];  Service: Vascular;  Laterality: Right;   [place] Left 12/27/2016   Procedure: BASILIC VEIN TRANSPOSITION LEFT UPPER ARM FIRST STAGE;  Surgeon: Conrad Laredo, MD;  Location: [facility];  Service: Vascular;  Laterality: Left;   [place] Left 01/31/2017   Procedure: LEFT ARM BASILIC VEIN TRANSPOSITION, SECOND STAGE;  Surgeon: Conrad Woodburn, MD;  Location: [place];  Service: Vascular;  Laterality: Left;   CARDIAC CATHETERIZATION   04-05-2010   checking for blockage but none-[facility]   COLONOSCOPY     CYSTOSCOPY W/ STONE MANIPULATION     "laser once" (01/22/2013)   HEMATOMA EVACUATION Left 05/09/2017   Procedure: EVACUATION HEMATOMA LEFT ARM;  Surgeon: Conrad Rich, MD;  Location: [facility];  Service: Vascular;  Laterality: Left;   HERNIA REPAIR     INGUINAL HERNIA REPAIR Right 11/06/2015   Procedure: OPEN HERNIA REPAIR  RIGHT INGUINAL ADULT;  Surgeon: [name], MD;  Location: [facility];  Service: General;  Laterality: Right;  with MESH   INSERTION OF DIALYSIS CATHETER Right 10/05/2016   Procedure: INSERTION OF right internal jugular DIALYSIS CATHETER;  Surgeon: [name], MD;  Location: [place] [facility];  Service: Vascular;  Laterality: Right;   INSERTION OF MESH  03/20/2012   Procedure: INSERTION OF MESH;  Danella Maiers Surgeon: [name], MD;  Location: [place] [facility];  Service: General;  Laterality: N/A;   INSERTION OF MESH N/A 01/22/2013   Procedure: INSERTION OF MESH;  Surgeon: [name], MD;  Location: [facility];  Service: General;  Laterality: N/A;   LAPAROTOMY  04/02/2012   Procedure: EXPLORATORY LAPAROTOMY;  Surgeon: [name], MD;  Location: [facility] [facility];  Service: General;  Laterality: N/A;  Exploratory Laparotomy with resection of small intestine   LEFT HEART CATHETERIZATION WITH CORONARY ANGIOGRAM N/A 05/14/2013   Procedure: LEFT HEART CATHETERIZATION WITH CORONARY ANGIOGRAM;  Surgeon: Sinclair Grooms, MD;  Location: [facility];  Service: Cardiovascular;  Laterality: N/A;   LIGATION OF ARTERIOVENOUS  FISTULA Left 12/27/2016   Procedure: LIGATION/EXCISION OF LEFT UPPER ARM ARTERIOVENOUS  FISTULA;  EVACUATION OF HEMATOMA;  Surgeon: Conrad [place], MD;  Location: [place];  Service: Vascular;  Laterality: Left;   LIGATION OF ARTERIOVENOUS  FISTULA Left 03/07/2019   Procedure: LIGATION OF ARTERIOVENOUS FISTULA  LEFT UPPER ARM;  Surgeon: Marty Heck, MD;  Location: [place] [place];  Service: Vascular;  Laterality: Left;   REVISON OF  ARTERIOVENOUS FISTULA Left 10/05/2016   Procedure: REVISON OF left arm ARTERIOVENOUS FISTULA;  Surgeon: [name], MD;  Location: [facility];  Service: Vascular;  Laterality: Left;   TONSILLECTOMY     UMBILICAL HERNIA REPAIR  03/20/2012   Procedure: HERNIA REPAIR UMBILICAL ADULT;  Surgeon: [name], MD;  Location: [facility];  Service: General;  Laterality: N/A;   UMBILICAL HERNIA REPAIR  01/22/2013   preperitoneal open procedure due to significant adhesions/notes 01/22/2013   VENTRAL HERNIA REPAIR N/A 01/22/2013   Procedure: ATTEMPTED LAPAROSCOPIC VENTRAL HERNIA CONVERTED TO OPEN;  Surgeon: [name], MD;  Location: [place];  Service: General;  Laterality: N/A;    Current Medications: Current Meds  Medication Sig   acyclovir (ZOVIRAX) 400 MG tablet Take 1 tablet (400 mg total) by mouth daily with lunch.   aspirin EC 81 MG EC tablet Take 1 tablet (81 mg total) by mouth daily.   atorvastatin (LIPITOR) 40 MG tablet Take 40 mg by mouth at bedtime.   AURYXIA 1 GM  210 MG(Fe) tablet Take 210-630 mg by mouth See admin instructions. Take 630 mg by mouth three times a day with meals and 210 mg twice a day with snacks   cinacalcet (SENSIPAR) 30 MG tablet Take 30 mg by mouth 3 (three) times a week.   dorzolamide-timolol (COSOPT) 22.3-6.8 MG/ML ophthalmic solution Place 1 drop into both eyes 2 (two) times daily.   ELIQUIS 5 MG TABS tablet Take 1 tablet by mouth twice daily   nitroGLYCERIN (NITROSTAT) 0.4 MG SL tablet Place 1 tablet (0.4 mg total) under the tongue every 5 (five) minutes x 3 doses as needed for chest pain.   prednisoLONE acetate (PRED FORTE) 1 % ophthalmic suspension Place 1 drop into the right eye 2 (two) times daily.   sevelamer carbonate (RENVELA) 800 MG tablet Take 2 tablets (1,600 mg total) by mouth 3 (three) times daily with meals.     Allergies:    Allergies  Allergen Reactions   Indapamide Other (See Comments)    Social History   Tobacco Use   Smoking status: Never    Smokeless tobacco: Never  Vaping Use   Vaping Use: Never used  Substance Use Topics   Alcohol use: No    Alcohol/week: 0.0 standard drinks   Drug use: No     Family History: Family History  Problem Relation Age of Onset   Heart disease Mother    Hyperlipidemia Mother    Hypertension Mother    Kidney disease Father    Stroke Father    Kidney disease Brother    Amblyopia Neg Hx    Blindness Neg Hx    Cataracts Neg Hx    Diabetes Neg Hx    Glaucoma Neg Hx    Macular degeneration Neg Hx    Retinal detachment Neg Hx    Strabismus Neg Hx    Retinitis pigmentosa Neg Hx    Breast cancer Neg Hx    Prostate cancer Neg Hx    Colon cancer Neg Hx    Pancreatic cancer Neg Hx      ROS:   Please see the history of present illness.    All other systems reviewed and are negative.  EKGs/Labs/Other Studies Reviewed:    The following studies were reviewed today:  EKG: Atrial fibrillation with an IVCD  Imaging studies that I have independently reviewed today:   Event Monitor Summary 05/23/2019  1. NSR with sinus bradycardia 2. PAF with RVR and CVR 3. NSVT 4. No prolonged pauses  Gregg Taylor,M.D.   Echocardiogram 06/04/2019 1. Left ventricular ejection fraction, by visual estimation, is <20%. The left ventricle has severely decreased function. There is no left ventricular hypertrophy. 2. Severely dilated left ventricular internal cavity size. 3. The left ventricle demonstrates global hypokinesis. 4. Left ventricular diastolic parameters are consistent with Grade II diastolic dysfunction (pseudonormalization). 5. Global right ventricle has normal systolicnction.The right ventricular size is normal. No increase in right ventricular wall thickness. 6. Left atrial size was severely dilated. 7. Right atrial size was severely dilated. 8. The mitral valve is normal in structure. Trivial mitral valve regurgitation. No evidence of mitral stenosis. 9. The tricuspid valve is normal in  structure. Tricuspid valve regurgitation is mild. 10. The aortic valve is tricuspid. Aortic valve regurgitation is mild. Moderate aortic valve sclerosis/calcification without any evidence of aortic stenosis. 11. The pulmonic valve was normal in structure. Pulmonic valve regurgitation is not visualized. 12. There is mild dilatation of the aortic root measuring 41 mm. 13. Normal pulmonary  artery systolic pressure. 14. The inferior vena cava is dilated in size with >50% respiratory variability, suggesting right atrial pressure of 8 mmHg. 15. The tricuspid regurgitant velocity is 2.04 m/s, and with an assumed right atrial pressure of 1mmHg, the estimated right ventricular systolic pressure is normal at 24.6 mmHg. 16. Small pericardial effusion. 17. The pericardial effusion is posterior to the left ventricle    Atrial flutter catheter ablation 04/11/2019 CONCLUSIONS:  1. Isthmus-dependent right atrial flutter upon presentation.  2. Successful radiofrequency ablation of atrial flutter along the cavotricuspid isthmus with complete bidirectional isthmus block achieved.  3. No inducible arrhythmias following ablation.  4. No early apparent complications.     Echo 11/21  1. There has been no change since the prior study on 06/04/2019, there is  severe biventricular dilatation and systolic dysfunction. No thrombus is  seen in the left ventricle of [place] echocontrast images.   2. Left ventricular ejection fraction, by estimation, is <20%. The left  ventricle has severely decreased function. The left ventricle demonstrates  global hypokinesis. The left ventricular internal cavity size was severely  dilated. Left ventricular  diastolic function could not be evaluated. There is the interventricular  septum is flattened in systole and diastole, consistent with right  ventricular pressure and volume overload.   3. Right ventricular systolic function is severely reduced. The right  ventricular size is  severely enlarged.   4. Left atrial size was severely dilated.   5. Right atrial size was severely dilated.   6. The pericardial effusion is posterior to the left ventricle. There is  no evidence of cardiac tamponade. Large pleural effusion in the left  lateral region.   7. The mitral valve is normal in structure. Mild mitral valve  regurgitation. No evidence of mitral stenosis.   8. The aortic valve is normal in structure. Aortic valve regurgitation is  mild. No aortic stenosis is present.   9. The inferior vena cava is normal in size with <50% respiratory  variability, suggesting right atrial pressure of 8 mmHg.   Cardiac cath 05/14/13:  1. Nonischemic cardiomyopathy with reduced EF less than 25% and severely elevated left ventricular end-diastolic pressures 2. Widely patent coronary arteries  Recent Labs: 08/19/2020: ALT 10 11/17/2020: B Natriuretic Peptide 2,991.4; BUN 25; Creatinine, Ser 8.59; Potassium 3.6; Sodium 140 01/21/2021: Hemoglobin 15.6; Platelet Count 115  Recent Lipid Panel    Component Value Date/Time   TRIG 105 06/01/2019 0418    Physical Exam:    VS:  BP 118/66 (BP Location: Right Arm, Patient Position: Sitting, Cuff Size: Normal)   Pulse 85   Ht 5\' 7"  (1.702 m)   Wt 197 lb 9.6 oz (89.6 kg)   SpO2 97%   BMI 30.95 kg/m     Wt Readings from Last 5 Encounters:  04/01/21 197 lb 9.6 oz (89.6 kg)  03/04/21 193 lb 9.6 oz (87.8 kg)  01/21/21 190 lb 3.2 oz (86.3 kg)  11/17/20 189 lb (85.7 kg)  08/19/20 188 lb (85.3 kg)    GENERAL:  No apparent distress, AOx3 HEENT:  No carotid bruits, +2 carotid impulses, no scleral icterus CAR: RRR no murmurs, gallops, rubs, or thrills RES:  Clear to auscultation bilaterally; HD indwelling catheter right chest ABD:  Soft, nontender, nondistended, positive bowel sounds x 4 VASC:  +2 radial pulses, +2 carotid pulses, palpable pedal pulses NEURO:  CN 2-12 grossly intact; motor and sensory grossly intact PSYCH:  No active  depression or anxiety EXT:  No edema, ecchymosis,  or cyanosis  ASSESSMENT:    1. NICM (nonischemic cardiomyopathy) ([place])   2. Hypertension, unspecified type   3. Hyperlipidemia, unspecified hyperlipidemia type   4. PND (paroxysmal nocturnal dyspnea)   5. Atrial fibrillation, unspecified type ([place])    PLAN:    NICM (nonischemic cardiomyopathy) ([place]) Start Losartan 25 at night, spironolactone 25 at night, Toprol XL 25 at night, but he is not a candidate for SGLT2 inhibitor due to ESRD.  Not a candidate for ICD due to infection risk. Will stop aspirin as patient is on Eliquis.  Will follow up echo on 12/7.  Will refer to pharmacy division for up titration of CHF regimen with plans to repeat echo for LVEF improvement in future.  I will see him back in 6 months or earlier if needed.  Hypertension, unspecified type Blood pressure is well controlled and we will be starting the medications as above.  These are once daily and I will have him take these at bedtime.  Hyperlipidemia, unspecified hyperlipidemia type Continue Crestor, lipids followed by PCP.  PND (paroxysmal nocturnal dyspnea) Patient is euvolemic on exam.  I will check a sleep study to evaluate further.  He has a history of requiring supplemental nocturnal oxygen.  Atrial fibrillation He is tolerating Eliquis well.  We will start Toprol-XL as detailed above.     Shared Decision Making/Informed Consent:       Medication Adjustments/Labs and Tests Ordered: Current medicines are reviewed at length with the patient today.  Concerns regarding medicines are outlined above.   No orders of the defined types were placed in this encounter.   No orders of the defined types were placed in this encounter.   There are no Patient Instructions on file for this visit.

## 2021-04-01 ENCOUNTER — Other Ambulatory Visit: Payer: Self-pay

## 2021-04-01 ENCOUNTER — Ambulatory Visit (INDEPENDENT_AMBULATORY_CARE_PROVIDER_SITE_OTHER): Payer: Medicare Other | Admitting: Internal Medicine

## 2021-04-01 ENCOUNTER — Encounter: Payer: Self-pay | Admitting: Internal Medicine

## 2021-04-01 VITALS — BP 118/66 | HR 85 | Ht 67.0 in | Wt 197.6 lb

## 2021-04-01 DIAGNOSIS — R0600 Dyspnea, unspecified: Secondary | ICD-10-CM | POA: Diagnosis not present

## 2021-04-01 DIAGNOSIS — I428 Other cardiomyopathies: Secondary | ICD-10-CM | POA: Diagnosis not present

## 2021-04-01 DIAGNOSIS — E785 Hyperlipidemia, unspecified: Secondary | ICD-10-CM

## 2021-04-01 DIAGNOSIS — Q612 Polycystic kidney, adult type: Secondary | ICD-10-CM | POA: Diagnosis not present

## 2021-04-01 DIAGNOSIS — N186 End stage renal disease: Secondary | ICD-10-CM | POA: Diagnosis not present

## 2021-04-01 DIAGNOSIS — I4891 Unspecified atrial fibrillation: Secondary | ICD-10-CM | POA: Diagnosis not present

## 2021-04-01 DIAGNOSIS — Z992 Dependence on renal dialysis: Secondary | ICD-10-CM | POA: Diagnosis not present

## 2021-04-01 DIAGNOSIS — I10 Essential (primary) hypertension: Secondary | ICD-10-CM

## 2021-04-01 MED ORDER — SPIRONOLACTONE 25 MG PO TABS: 25.0000 mg | ORAL_TABLET | Freq: Every day | ORAL | 3 refills | Status: DC

## 2021-04-01 MED ORDER — METOPROLOL SUCCINATE ER 25 MG PO TB24: 25.0000 mg | ORAL_TABLET | Freq: Every day | ORAL | 3 refills | Status: DC

## 2021-04-01 MED ORDER — LOSARTAN POTASSIUM 25 MG PO TABS: 25.0000 mg | ORAL_TABLET | Freq: Every day | ORAL | 3 refills | Status: DC

## 2021-04-01 NOTE — Patient Instructions (Signed)
Medication Instructions:  Your physician has recommended you make the following change in your medication:  1.) start losartan 25 mg - one tablet daily at bedtime 2.) start spironolactone 25 mg - one tablet daily at bedtime 3.) start metoprolol succinate (Toprol XL) 25 mg - one table daily at bedtime 4.) stop aspirin   *If you need a refill on your cardiac medications before your next appointment, please call your pharmacy*   Lab Work: none  Testing/Procedures: Your physician has recommended that you have a sleep study. This test records several body functions during sleep, including: brain activity, eye movement, oxygen and carbon dioxide blood levels, heart rate and rhythm, breathing rate and rhythm, the flow of air through your mouth and nose, snoring, body muscle movements, and chest and belly movement.   Follow-Up: At [facility], you and your health needs are our priority.  As part of our continuing mission to provide you with exceptional heart care, we have created designated Provider Care Teams.  These Care Teams include your primary Cardiologist (physician) and Advanced Practice Providers (APPs -  Physician Assistants and Nurse Practitioners) who all work together to provide you with the care you need, when you need it.  We recommend signing up for the patient portal called "MyChart".  Sign up information is provided on this After Visit Summary.  MyChart is used to connect with patients for Virtual Visits (Telemedicine).  Patients are able to view lab/test results, encounter notes, upcoming appointments, etc.  Non-urgent messages can be sent to your provider as well.   To learn more about what you can do with MyChart, go to [email or web address].    Your next appointment:   6 month(s)  The format for your next appointment:   In Person  Provider:   Lenna Sciara, MD   Other Instructions You have been referred to [facility] PharmD for HF medication management.

## 2021-04-02 DIAGNOSIS — N186 End stage renal disease: Secondary | ICD-10-CM | POA: Diagnosis not present

## 2021-04-02 DIAGNOSIS — Z992 Dependence on renal dialysis: Secondary | ICD-10-CM | POA: Diagnosis not present

## 2021-04-02 DIAGNOSIS — N2581 Secondary hyperparathyroidism of renal origin: Secondary | ICD-10-CM | POA: Diagnosis not present

## 2021-04-05 ENCOUNTER — Ambulatory Visit: Payer: Medicare Other | Admitting: Podiatry

## 2021-04-07 ENCOUNTER — Other Ambulatory Visit (HOSPITAL_COMMUNITY): Payer: Medicare Other

## 2021-04-07 ENCOUNTER — Telehealth: Payer: Self-pay | Admitting: *Deleted

## 2021-04-07 DIAGNOSIS — N186 End stage renal disease: Secondary | ICD-10-CM | POA: Diagnosis not present

## 2021-04-07 DIAGNOSIS — Z992 Dependence on renal dialysis: Secondary | ICD-10-CM | POA: Diagnosis not present

## 2021-04-07 DIAGNOSIS — N2581 Secondary hyperparathyroidism of renal origin: Secondary | ICD-10-CM | POA: Diagnosis not present

## 2021-04-07 NOTE — Telephone Encounter (Signed)
-----   Message from [name], RN sent at 04/01/2021  3:57 PM EST ----- Regarding: sleep study Hi! Pt has been referred for sleep study by Dr. Ali Lowe.  Thank you! Michael [name]

## 2021-04-09 DIAGNOSIS — Z992 Dependence on renal dialysis: Secondary | ICD-10-CM | POA: Diagnosis not present

## 2021-04-09 DIAGNOSIS — N2581 Secondary hyperparathyroidism of renal origin: Secondary | ICD-10-CM | POA: Diagnosis not present

## 2021-04-09 DIAGNOSIS — N186 End stage renal disease: Secondary | ICD-10-CM | POA: Diagnosis not present

## 2021-04-10 DIAGNOSIS — N2581 Secondary hyperparathyroidism of renal origin: Secondary | ICD-10-CM | POA: Diagnosis not present

## 2021-04-10 DIAGNOSIS — N186 End stage renal disease: Secondary | ICD-10-CM | POA: Diagnosis not present

## 2021-04-10 DIAGNOSIS — Z992 Dependence on renal dialysis: Secondary | ICD-10-CM | POA: Diagnosis not present

## 2021-04-12 DIAGNOSIS — N186 End stage renal disease: Secondary | ICD-10-CM | POA: Diagnosis not present

## 2021-04-12 DIAGNOSIS — Z992 Dependence on renal dialysis: Secondary | ICD-10-CM | POA: Diagnosis not present

## 2021-04-12 DIAGNOSIS — N2581 Secondary hyperparathyroidism of renal origin: Secondary | ICD-10-CM | POA: Diagnosis not present

## 2021-04-13 NOTE — Telephone Encounter (Signed)
NOT APPROVED: SENT FOR CLINICAL REVIEW: Prior Authorization for split night sent to [facility] via Phone. Reference #  Fax sent to Clinical Nurse Reviewer @ [phone]

## 2021-04-14 ENCOUNTER — Ambulatory Visit: Payer: Medicare Other | Admitting: Podiatry

## 2021-04-14 DIAGNOSIS — Z992 Dependence on renal dialysis: Secondary | ICD-10-CM | POA: Diagnosis not present

## 2021-04-14 DIAGNOSIS — N2581 Secondary hyperparathyroidism of renal origin: Secondary | ICD-10-CM | POA: Diagnosis not present

## 2021-04-14 DIAGNOSIS — N186 End stage renal disease: Secondary | ICD-10-CM | POA: Diagnosis not present

## 2021-04-16 DIAGNOSIS — Z992 Dependence on renal dialysis: Secondary | ICD-10-CM | POA: Diagnosis not present

## 2021-04-16 DIAGNOSIS — N186 End stage renal disease: Secondary | ICD-10-CM | POA: Diagnosis not present

## 2021-04-16 DIAGNOSIS — N2581 Secondary hyperparathyroidism of renal origin: Secondary | ICD-10-CM | POA: Diagnosis not present

## 2021-04-19 DIAGNOSIS — Z992 Dependence on renal dialysis: Secondary | ICD-10-CM | POA: Diagnosis not present

## 2021-04-19 DIAGNOSIS — N186 End stage renal disease: Secondary | ICD-10-CM | POA: Diagnosis not present

## 2021-04-19 DIAGNOSIS — N2581 Secondary hyperparathyroidism of renal origin: Secondary | ICD-10-CM | POA: Diagnosis not present

## 2021-04-21 DIAGNOSIS — Z992 Dependence on renal dialysis: Secondary | ICD-10-CM | POA: Diagnosis not present

## 2021-04-21 DIAGNOSIS — N186 End stage renal disease: Secondary | ICD-10-CM | POA: Diagnosis not present

## 2021-04-21 DIAGNOSIS — N2581 Secondary hyperparathyroidism of renal origin: Secondary | ICD-10-CM | POA: Diagnosis not present

## 2021-04-22 ENCOUNTER — Ambulatory Visit (INDEPENDENT_AMBULATORY_CARE_PROVIDER_SITE_OTHER): Payer: Medicare Other | Admitting: Pharmacist

## 2021-04-22 ENCOUNTER — Other Ambulatory Visit: Payer: Self-pay

## 2021-04-22 ENCOUNTER — Ambulatory Visit (HOSPITAL_COMMUNITY): Payer: Medicare Other | Attending: Internal Medicine

## 2021-04-22 VITALS — BP 104/68 | HR 59 | Wt 197.0 lb

## 2021-04-22 DIAGNOSIS — E785 Hyperlipidemia, unspecified: Secondary | ICD-10-CM | POA: Insufficient documentation

## 2021-04-22 DIAGNOSIS — E782 Mixed hyperlipidemia: Secondary | ICD-10-CM | POA: Diagnosis not present

## 2021-04-22 DIAGNOSIS — I428 Other cardiomyopathies: Secondary | ICD-10-CM | POA: Diagnosis not present

## 2021-04-22 DIAGNOSIS — I5022 Chronic systolic (congestive) heart failure: Secondary | ICD-10-CM | POA: Insufficient documentation

## 2021-04-22 LAB — ECHOCARDIOGRAM COMPLETE
Area-P 1/2: 5.02 cm2
P 1/2 time: 847 msec
S' Lateral: 6.4 cm
Weight: 3152 oz

## 2021-04-22 MED ORDER — PERFLUTREN LIPID MICROSPHERE: 1.0000 mL | INTRAVENOUS | Status: DC | PRN

## 2021-04-22 MED ORDER — ROSUVASTATIN CALCIUM 10 MG PO TABS: 10.0000 mg | ORAL_TABLET | Freq: Every day | ORAL | 3 refills | Status: DC

## 2021-04-22 NOTE — Progress Notes (Signed)
Patient ID: Michael [name].                 DOB: 1956-04-18                      [name]: [id number]     HPI: Michael [name]. is a 65 y.o. male referred by Dr. Ali Lowe to pharmacy clinic for HF medication management. PMH is significant for nonischemic cardiomyopathy with EF < 20% 03/2020 not a candidate for ICD due to infection risk from indwelling HD catheter, ESRD on HD, aflutter s/p ablation now with persistent afib, CAD, and HTN. Last seen by Dr Ali Lowe on 04/01/21, pt was not on any GDMT for HF at that time. Pt was started on Toprol 25mg  daily, losartan 25mg  daily, and spironolactone 25mg  daily. SGLT2i contraindicated in dialysis. He presents today for follow up. Also has echo scheduled today.  Pt presents today in good spirits. Reports tolerating medications well aside from diarrhea that he started experiencing after new meds were added. Takes all new meds at night. Denies dizziness, headaches, LE edema, and SOB. No trouble completing his ADLs. Has both atorvastatin and rosuvastatin on his med list - he is only taking rosuvastatin. Has BP checked at dialysis, recent readings 118/76, 120/75, denies any lows.   Of note, check BP on his right arm due to dialysis port on his left.  Current CHF meds: Toprol 25mg  daily, losartan 25mg  daily, spironolactone 25mg  daily  BP goal: <130/35mmHg  Family History: Mother with heart disease, HTN and HLD. Father with CKD and stroke. Brother with CKD.  Social History: Denies tobacco, alcohol and drug use.  Wt Readings from Last 3 Encounters:  04/01/21 197 lb 9.6 oz (89.6 kg)  03/04/21 193 lb 9.6 oz (87.8 kg)  01/21/21 190 lb 3.2 oz (86.3 kg)   BP Readings from Last 3 Encounters:  04/01/21 118/66  03/04/21 120/66  01/21/21 116/83   Pulse Readings from Last 3 Encounters:  04/01/21 85  03/04/21 (!) 50  01/21/21 89    Renal function: CrCl cannot be calculated (Patient's most recent lab result is older than the maximum 21 days allowed.).  Past  Medical History:  Diagnosis Date   Atrial arrhythmia    atrial tachycardia with variable AV conduction versus atypical aflutter 01/10/19, rate control (02/06/19)   Cataract    Dyspnea    on exertion   ESRD (end stage renal disease) ([place])    Hemo- MWF, Polycystic kidney disease   Fatigue    History of kidney stones    removal of stone- cysto   Hyperlipidemia    Hyperparathyroidism, secondary renal ([place])    Hypertension    Hypoxemia 12/12/2013   Nonischemic cardiomyopathy ([place])    Er 25% 2015, 55 % 2013   OSA on CPAP    no longer using cpap   OSA on CPAP 03/24/2014   Pneumonia    2015ish   Prostate cancer ([place])    Ventricular tachycardia//Freq PVCs    Wears glasses     Current Outpatient Medications on File Prior to Visit  Medication Sig Dispense Refill   acyclovir (ZOVIRAX) 400 MG tablet Take 1 tablet (400 mg total) by mouth daily with lunch. 30 tablet 1   atorvastatin (LIPITOR) 40 MG tablet Take 40 mg by mouth at bedtime.     AURYXIA 1 GM 210 MG(Fe) tablet Take 210-630 mg by mouth See admin instructions. Take 630 mg by mouth three times a day with meals and  210 mg twice a day with snacks     cinacalcet (SENSIPAR) 30 MG tablet Take 30 mg by mouth 3 (three) times a week.     clotrimazole-betamethasone (LOTRISONE) cream Apply 1 application topically 2 (two) times daily. (Patient not taking: Reported on 04/01/2021) 45 g 2   dorzolamide-timolol (COSOPT) 22.3-6.8 MG/ML ophthalmic solution Place 1 drop into both eyes 2 (two) times daily.     doxycycline (VIBRAMYCIN) 100 MG capsule Take 1 capsule (100 mg total) by mouth 2 (two) times daily. (Patient not taking: Reported on 03/04/2021) 14 capsule 0   ELIQUIS 5 MG TABS tablet Take 1 tablet by mouth twice daily 180 tablet 0   losartan (COZAAR) 25 MG tablet Take 1 tablet (25 mg total) by mouth at bedtime. 90 tablet 3   metoprolol succinate (TOPROL XL) 25 MG 24 hr tablet Take 1 tablet (25 mg total) by mouth at bedtime. 90 tablet 3    nitroGLYCERIN (NITROSTAT) 0.4 MG SL tablet Place 1 tablet (0.4 mg total) under the tongue every 5 (five) minutes x 3 doses as needed for chest pain. 30 tablet 12   prednisoLONE acetate (PRED FORTE) 1 % ophthalmic suspension Place 1 drop into the right eye 2 (two) times daily.     rosuvastatin (CRESTOR) 20 MG tablet Take 20 mg by mouth daily. (Patient not taking: Reported on 03/04/2021)     sevelamer carbonate (RENVELA) 800 MG tablet Take 2 tablets (1,600 mg total) by mouth 3 (three) times daily with meals. 180 tablet 0   spironolactone (ALDACTONE) 25 MG tablet Take 1 tablet (25 mg total) by mouth at bedtime. 90 tablet 3   terbinafine (LAMISIL) 250 MG tablet Take 1 tablet (250 mg total) by mouth daily. (Patient not taking: Reported on 03/04/2021) 30 tablet 1   No current facility-administered medications on file prior to visit.    Allergies  Allergen Reactions   Indapamide Other (See Comments)     Assessment/Plan:  1. CHF with EF < 20% - Echo being rechecked today. Started on BB, ARB, and MRA 3 weeks ago, previously was not on any GDMT for his CHF. BP on the soft side today, BP readings are stable at dialysis and pt tolerating new meds well. Checking BMET today given recent ARB and MRA start. Pt reporting some diarrhea after starting new meds, Toprol would be most likely to be causing this. He is ok continuing on current dose now but will let us know if symptoms do not improve and we can try reducing his Toprol dose. For now, will continue Toprol 25mg  daily, spironolactone 25mg  daily, and losartan 25mg  daily pending stable electrolytes on today's labs. Unable to titrate med doses due to BP of 104/68 today, HR great at 59. SGLT2i contraindicated given dialysis.  2. Hyperlipidemia - Pt currently taking rosuvastatin 20mg  daily. Discussed changing him to atorvastatin as it's preferred in patients on dialysis. Pt does not wish to change since he's been tolerating rosuvastatin well. Will decrease  rosuvastatin to 10mg  daily which is the max recommended dose for patients on dialysis.  Michael [name] E. Kameryn Tisdel, PharmD, BCACP, [place] [address] [place], [place] [address] Phone: [phone]; Fax: [phone] 04/22/2021 2:45 PM

## 2021-04-22 NOTE — Patient Instructions (Signed)
Your blood pressure is at goal < 130/80mmHg  Decrease your rosuvastatin from 20mg  to 10mg  daily. You can take 1/2 of your current 20mg  tablets daily until you run out, then your next refill will be for the 10mg  dose and you can go back to taking 1 tablet daily  We'll check your lab work today to look at your electrolytes

## 2021-04-23 DIAGNOSIS — N2581 Secondary hyperparathyroidism of renal origin: Secondary | ICD-10-CM | POA: Diagnosis not present

## 2021-04-23 DIAGNOSIS — N186 End stage renal disease: Secondary | ICD-10-CM | POA: Diagnosis not present

## 2021-04-23 DIAGNOSIS — Z992 Dependence on renal dialysis: Secondary | ICD-10-CM | POA: Diagnosis not present

## 2021-04-23 LAB — BASIC METABOLIC PANEL
BUN/Creatinine Ratio: 3 — ABNORMAL LOW (ref 10–24)
BUN: 23 mg/dL (ref 8–27)
CO2: 26 mmol/L (ref 20–29)
Calcium: 9.6 mg/dL (ref 8.6–10.2)
Chloride: 96 mmol/L (ref 96–106)
Creatinine, Ser: 7.22 mg/dL — ABNORMAL HIGH (ref 0.76–1.27)
Glucose: 81 mg/dL (ref 70–99)
Potassium: 5.3 mmol/L — ABNORMAL HIGH (ref 3.5–5.2)
Sodium: 140 mmol/L (ref 134–144)
eGFR: 8 mL/min/{1.73_m2} — ABNORMAL LOW (ref >59)

## 2021-04-26 DIAGNOSIS — N2581 Secondary hyperparathyroidism of renal origin: Secondary | ICD-10-CM | POA: Diagnosis not present

## 2021-04-26 DIAGNOSIS — Z992 Dependence on renal dialysis: Secondary | ICD-10-CM | POA: Diagnosis not present

## 2021-04-26 DIAGNOSIS — N186 End stage renal disease: Secondary | ICD-10-CM | POA: Diagnosis not present

## 2021-04-28 DIAGNOSIS — Z992 Dependence on renal dialysis: Secondary | ICD-10-CM | POA: Diagnosis not present

## 2021-04-28 DIAGNOSIS — N2581 Secondary hyperparathyroidism of renal origin: Secondary | ICD-10-CM | POA: Diagnosis not present

## 2021-04-28 DIAGNOSIS — N186 End stage renal disease: Secondary | ICD-10-CM | POA: Diagnosis not present

## 2021-04-30 DIAGNOSIS — Z992 Dependence on renal dialysis: Secondary | ICD-10-CM | POA: Diagnosis not present

## 2021-04-30 DIAGNOSIS — N186 End stage renal disease: Secondary | ICD-10-CM | POA: Diagnosis not present

## 2021-04-30 DIAGNOSIS — N2581 Secondary hyperparathyroidism of renal origin: Secondary | ICD-10-CM | POA: Diagnosis not present

## 2021-05-02 DIAGNOSIS — N186 End stage renal disease: Secondary | ICD-10-CM | POA: Diagnosis not present

## 2021-05-02 DIAGNOSIS — Q612 Polycystic kidney, adult type: Secondary | ICD-10-CM | POA: Diagnosis not present

## 2021-05-02 DIAGNOSIS — Z992 Dependence on renal dialysis: Secondary | ICD-10-CM | POA: Diagnosis not present

## 2021-05-03 DIAGNOSIS — Z992 Dependence on renal dialysis: Secondary | ICD-10-CM | POA: Diagnosis not present

## 2021-05-03 DIAGNOSIS — N2581 Secondary hyperparathyroidism of renal origin: Secondary | ICD-10-CM | POA: Diagnosis not present

## 2021-05-03 DIAGNOSIS — N186 End stage renal disease: Secondary | ICD-10-CM | POA: Diagnosis not present

## 2021-05-03 DIAGNOSIS — D689 Coagulation defect, unspecified: Secondary | ICD-10-CM | POA: Diagnosis not present

## 2021-05-04 ENCOUNTER — Telehealth: Payer: Self-pay | Admitting: Pharmacist

## 2021-05-04 NOTE — Telephone Encounter (Signed)
Pt has been off of spironolactone for the past 12 days due to rise in K. Goal to try restarting at lower dose for CHF benefit with close lab monitoring.  Called pt, he reports BP has been dropping low at dialysis to the point where they have to turn off the machine because his BP is too low. Doesn't recall specific readings but he starts sweating when this happens. Will not restart spironolactone at this time because of this. Will cut losartan back from 25mg  to 12.5mg  daily as well. I'll call pt in another few weeks to see if BP has improved. Anticipate hypotension will limit titration of his CHF meds.

## 2021-05-05 DIAGNOSIS — Z992 Dependence on renal dialysis: Secondary | ICD-10-CM | POA: Diagnosis not present

## 2021-05-05 DIAGNOSIS — D689 Coagulation defect, unspecified: Secondary | ICD-10-CM | POA: Diagnosis not present

## 2021-05-05 DIAGNOSIS — N186 End stage renal disease: Secondary | ICD-10-CM | POA: Diagnosis not present

## 2021-05-05 DIAGNOSIS — N2581 Secondary hyperparathyroidism of renal origin: Secondary | ICD-10-CM | POA: Diagnosis not present

## 2021-05-07 DIAGNOSIS — D689 Coagulation defect, unspecified: Secondary | ICD-10-CM | POA: Diagnosis not present

## 2021-05-07 DIAGNOSIS — N186 End stage renal disease: Secondary | ICD-10-CM | POA: Diagnosis not present

## 2021-05-07 DIAGNOSIS — Z992 Dependence on renal dialysis: Secondary | ICD-10-CM | POA: Diagnosis not present

## 2021-05-07 DIAGNOSIS — N2581 Secondary hyperparathyroidism of renal origin: Secondary | ICD-10-CM | POA: Diagnosis not present

## 2021-05-10 ENCOUNTER — Ambulatory Visit (INDEPENDENT_AMBULATORY_CARE_PROVIDER_SITE_OTHER): Payer: Medicare Other | Admitting: Podiatry

## 2021-05-10 ENCOUNTER — Other Ambulatory Visit: Payer: Self-pay

## 2021-05-10 DIAGNOSIS — M79675 Pain in left toe(s): Secondary | ICD-10-CM | POA: Diagnosis not present

## 2021-05-10 DIAGNOSIS — Z992 Dependence on renal dialysis: Secondary | ICD-10-CM | POA: Diagnosis not present

## 2021-05-10 DIAGNOSIS — D689 Coagulation defect, unspecified: Secondary | ICD-10-CM | POA: Diagnosis not present

## 2021-05-10 DIAGNOSIS — B351 Tinea unguium: Secondary | ICD-10-CM

## 2021-05-10 DIAGNOSIS — N2581 Secondary hyperparathyroidism of renal origin: Secondary | ICD-10-CM | POA: Diagnosis not present

## 2021-05-10 DIAGNOSIS — N186 End stage renal disease: Secondary | ICD-10-CM | POA: Diagnosis not present

## 2021-05-10 DIAGNOSIS — M79674 Pain in right toe(s): Secondary | ICD-10-CM | POA: Diagnosis not present

## 2021-05-13 DIAGNOSIS — N186 End stage renal disease: Secondary | ICD-10-CM | POA: Diagnosis not present

## 2021-05-13 DIAGNOSIS — N2581 Secondary hyperparathyroidism of renal origin: Secondary | ICD-10-CM | POA: Diagnosis not present

## 2021-05-13 DIAGNOSIS — D689 Coagulation defect, unspecified: Secondary | ICD-10-CM | POA: Diagnosis not present

## 2021-05-13 DIAGNOSIS — Z992 Dependence on renal dialysis: Secondary | ICD-10-CM | POA: Diagnosis not present

## 2021-05-14 DIAGNOSIS — Z992 Dependence on renal dialysis: Secondary | ICD-10-CM | POA: Diagnosis not present

## 2021-05-14 DIAGNOSIS — N186 End stage renal disease: Secondary | ICD-10-CM | POA: Diagnosis not present

## 2021-05-14 DIAGNOSIS — D689 Coagulation defect, unspecified: Secondary | ICD-10-CM | POA: Diagnosis not present

## 2021-05-14 DIAGNOSIS — N2581 Secondary hyperparathyroidism of renal origin: Secondary | ICD-10-CM | POA: Diagnosis not present

## 2021-05-14 DIAGNOSIS — T82898A Other specified complication of vascular prosthetic devices, implants and grafts, initial encounter: Secondary | ICD-10-CM | POA: Diagnosis not present

## 2021-05-16 ENCOUNTER — Emergency Department (HOSPITAL_COMMUNITY)
Admission: EM | Admit: 2021-05-16 | Discharge: 2021-05-16 | Disposition: A | Discharge status: Home / Self Care | Payer: Medicare Other | Attending: Emergency Medicine | Admitting: Emergency Medicine

## 2021-05-16 ENCOUNTER — Encounter (HOSPITAL_COMMUNITY): Payer: Self-pay | Admitting: Emergency Medicine

## 2021-05-16 ENCOUNTER — Emergency Department (HOSPITAL_COMMUNITY): Payer: Medicare Other

## 2021-05-16 DIAGNOSIS — S0093XA Contusion of unspecified part of head, initial encounter: Secondary | ICD-10-CM | POA: Diagnosis not present

## 2021-05-16 DIAGNOSIS — Y93F2 Activity, caregiving, lifting: Secondary | ICD-10-CM | POA: Insufficient documentation

## 2021-05-16 DIAGNOSIS — Z79899 Other long term (current) drug therapy: Secondary | ICD-10-CM | POA: Diagnosis not present

## 2021-05-16 DIAGNOSIS — Z992 Dependence on renal dialysis: Secondary | ICD-10-CM | POA: Insufficient documentation

## 2021-05-16 DIAGNOSIS — W19XXXA Unspecified fall, initial encounter: Secondary | ICD-10-CM

## 2021-05-16 DIAGNOSIS — S8001XA Contusion of right knee, initial encounter: Secondary | ICD-10-CM | POA: Diagnosis not present

## 2021-05-16 DIAGNOSIS — Z7901 Long term (current) use of anticoagulants: Secondary | ICD-10-CM | POA: Diagnosis not present

## 2021-05-16 DIAGNOSIS — S8991XA Unspecified injury of right lower leg, initial encounter: Secondary | ICD-10-CM | POA: Diagnosis present

## 2021-05-16 DIAGNOSIS — N186 End stage renal disease: Secondary | ICD-10-CM | POA: Insufficient documentation

## 2021-05-16 DIAGNOSIS — W010XXA Fall on same level from slipping, tripping and stumbling without subsequent striking against object, initial encounter: Secondary | ICD-10-CM | POA: Insufficient documentation

## 2021-05-16 DIAGNOSIS — M25561 Pain in right knee: Secondary | ICD-10-CM | POA: Diagnosis not present

## 2021-05-16 DIAGNOSIS — M7989 Other specified soft tissue disorders: Secondary | ICD-10-CM | POA: Diagnosis not present

## 2021-05-16 IMAGING — CR DG KNEE AP/LAT W/ SUNRISE*R*
3 series · 3 of 3 positions shown · non-contrast
Comparison: None.

CLINICAL DATA: Right knee pain and swelling after fall

EXAM:
RIGHT KNEE 3 VIEWS

[x knee ap right]
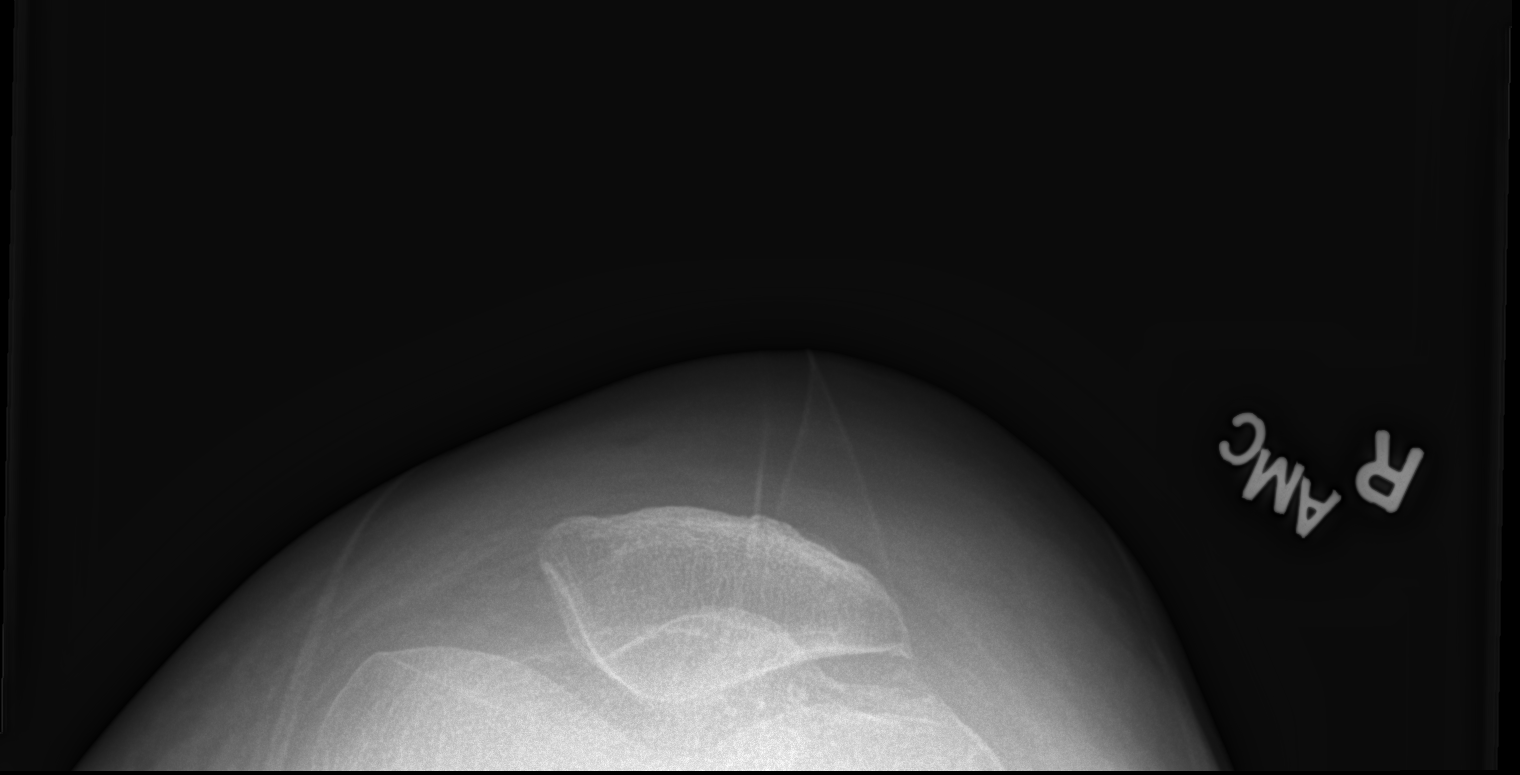

[t knee ap right]
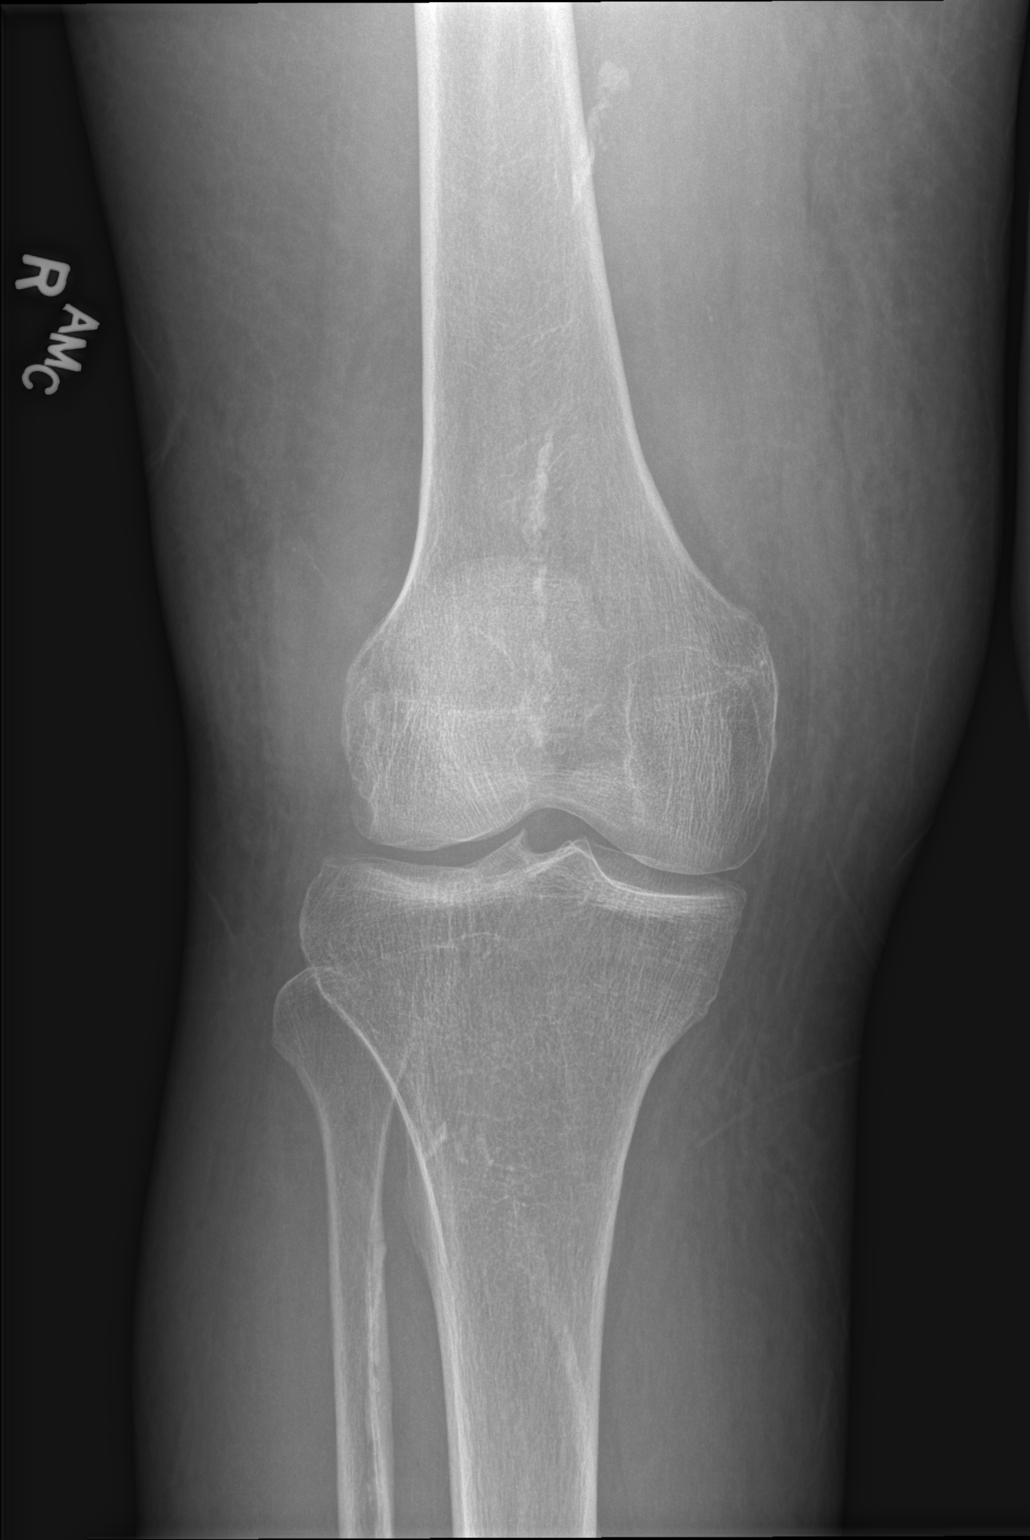

[t knee lat right]
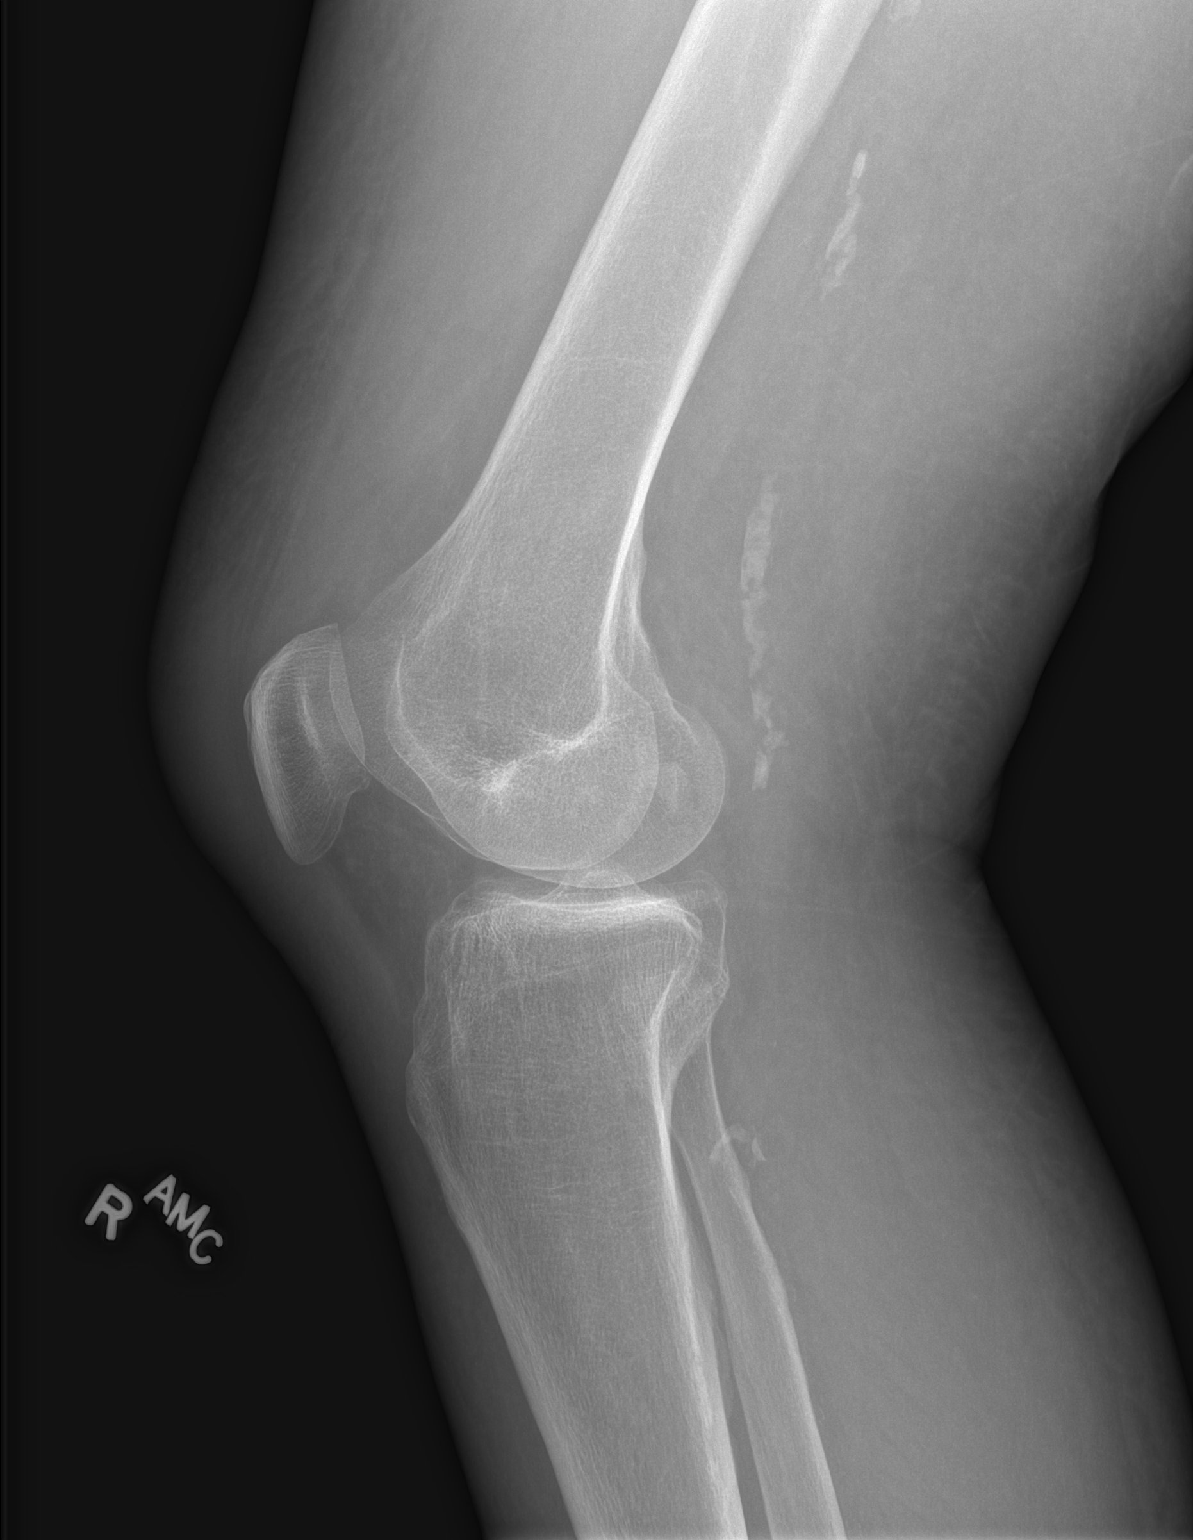

[3 of 3 positions shown; findings below may reference images not displayed]

FINDINGS: No evidence of acute fracture. No malalignment. Small joint
effusion. Prominent prepatellar soft tissue swelling.
Atherosclerotic vascular calcifications are present.
IMPRESSION: 1. Prominent prepatellar soft tissue swelling. No acute fracture or
malalignment.
2. Small joint effusion.

## 2021-05-16 NOTE — ED Provider Notes (Signed)
[facility] DEPT Provider Note   CSN: [id number] Arrival date & time: 05/16/21  1423     History  Chief Complaint  Patient presents with   Knee Pain    Michael [name]. is a 66 y.o. male.  HPI Patient has extensive medical history including ESRD on dialysis and anticoagulated on Eliquis with history of atrial flutter.  Patient ports he was taking some groceries into his house last week and he tripped on the stairs and fell onto his wooden deck with the right knee and also struck his head.  Denies he got knocked out.  He denies he had any headache.  He has been fine all week without symptoms except for persistent swelling of the right knee.  He reports that it does improve if he elevates it and ices it.  He has been trying a week but every time he gets up and moves it persists and large swelling and is painful on the front of the knee.  He denies any numbness or tingling in the foot.  Reports the foot and the upper thigh feel fine.    Home Medications Prior to Admission medications   Medication Sig Start Date End Date Taking? Authorizing Provider  acyclovir (ZOVIRAX) 400 MG tablet Take 400 mg by mouth 2 (two) times daily.    [provider]  cinacalcet (SENSIPAR) 30 MG tablet Take 30 mg by mouth 3 (three) times a week. 10/05/20   [provider]  clotrimazole-betamethasone (LOTRISONE) cream Apply 1 application topically 2 (two) times daily. 12/28/20   Edrick Kins, DPM  ELIQUIS 5 MG TABS tablet Take 1 tablet by mouth twice daily 03/05/21   Evans Lance, MD  losartan (COZAAR) 25 MG tablet Take 0.5 tablets (12.5 mg total) by mouth at bedtime. 05/04/21   Supple, Megan E, RPH-CPP  metoprolol succinate (TOPROL XL) 25 MG 24 hr tablet Take 1 tablet (25 mg total) by mouth at bedtime. 04/01/21   Early Osmond, MD  nitroGLYCERIN (NITROSTAT) 0.4 MG SL tablet Place 1 tablet (0.4 mg total) under the tongue every 5 (five) minutes x 3 doses as needed  for chest pain. Patient not taking: Reported on 04/22/2021 05/19/13   Lendon Colonel, NP  rosuvastatin (CRESTOR) 10 MG tablet Take 1 tablet (10 mg total) by mouth daily. 04/22/21   Early Osmond, MD  sevelamer carbonate (RENVELA) 800 MG tablet Take 800 mg by mouth 3 (three) times daily with meals.    [provider]      Allergies    Indapamide    Review of Systems   Review of Systems Constitutional: No associated fevers chills or malaise Neurologic: No headache no confusion Physical Exam Updated Vital Signs BP 106/78 (BP Location: Right Arm)    Pulse 76    Temp 97.6 F (36.4 C)    Resp 19    SpO2 99%  Physical Exam Constitutional:      Comments: Patient is alert, nontoxic and well in appearance.  No respiratory distress.  HENT:     Head:     Comments: About 3 cm hematoma to the left forehead that is focal with no overlying skin changes. Eyes:     Extraocular Movements: Extraocular movements intact.  Pulmonary:     Effort: Pulmonary effort is normal.  Musculoskeletal:     Comments: Patient has moderate to large swelling of the prepatellar region of the right knee.  Also, diffuse swelling below the knee and to  about the mid calf.  Knee is somewhat stiff but range of motion intact without severe pain.  No swelling at the ankle or the foot.  The foot is warm and dry.  No decrease sensation or pain.  No wounds.  Skin:    General: Skin is warm and dry.  Neurological:     General: No focal deficit present.     Mental Status: He is oriented to person, place, and time.     Coordination: Coordination normal.  Psychiatric:        Mood and Affect: Mood normal.    ED Results / Procedures / Treatments   Labs (all labs ordered are listed, but only abnormal results are displayed) Labs Reviewed - No data to display  EKG None  Radiology DG Knee AP/LAT W/Sunrise Right  Result Date: 05/16/2021 CLINICAL DATA:  Right knee pain and swelling after fall EXAM: RIGHT KNEE 3  VIEWS COMPARISON:  None. FINDINGS: No evidence of acute fracture. No malalignment. Small joint effusion. Prominent prepatellar soft tissue swelling. Atherosclerotic vascular calcifications are present. IMPRESSION: 1. Prominent prepatellar soft tissue swelling. No acute fracture or malalignment. 2. Small joint effusion. Electronically Signed   By: Davina Poke D.O.   On: 05/16/2021 15:21    Procedures Procedures    Medications Ordered in ED Medications - No data to display  ED Course/ Medical Decision Making/ A&P                           Medical Decision Making  Patient presents for persistent large swelling of the right knee and upper portion of the lower leg after a fall a week ago.  He has been trying conservative measures at home with elevating and icing as well as applying a topical pain cream.  He reports he is doing all right for pain although it is uncomfortable particularly on the front of the knee.  At this time concern for possible patellar fracture.  Patient has been weightbearing with lower suspicion for tibial plateau fracture.  No signs of neurovascular compromise to suggest compartment syndrome.  X-rays reviewed and negative for acute fracture.  Per radiology, there is a small effusion.  At this time do not suspect septic joint.  Patient has no constitutional symptoms and is otherwise well.  Pain is not consistent with septic joint.  Plan will be to follow-up with orthopedics.  Recommendation is to continue elevating is much as possible and well wrapped ice pack.  Ace wrap when standing and walking.  Careful return precautions included and reviewed with patient and his wife at bedside. Final Clinical Impression(s) / ED Diagnoses Final diagnoses:  Fall, initial encounter  Traumatic hematoma of knee, right, initial encounter  Anticoagulated    Rx / DC Orders ED Discharge Orders     None         Charlesetta Shanks, MD 05/16/21 1622

## 2021-05-16 NOTE — Discharge Instructions (Signed)
1.  You may safely take acetaminophen (Tylenol) per package instructions for pain. 2.  Continue to try to elevate your right leg is much as possible.  Apply the Ace wrap when you are standing and walking.  May remove it when you are elevating and icing. 3.  Because you are on Eliquis, you have a very large hematoma (focal collection of blood from bruising) on the front of your knee from your fall.  As this reabsorbs, there is swelling that develops below the knee as blood travels downward.  Also, with Eliquis you may very well be having persistent amounts of bleeding off-and-on from your traumatic injury.  At this time, there are no apparent signs of secondary complications such as infection or compartment syndrome (see additional information or discharge instructions). 4.  Follow-up with [name] for recheck.  To schedule an appointment for recheck as soon as possible. 5.  Return to emergency department immediately if you develop fevers, feeling of general illness or unwell, any numbness tingling or pain in your foot or other concerning symptoms.

## 2021-05-16 NOTE — ED Triage Notes (Signed)
Patient c/o R knee pain after fall while carrying in groceries x1 week ago. Swelling noted to knee.

## 2021-05-17 DIAGNOSIS — Z992 Dependence on renal dialysis: Secondary | ICD-10-CM | POA: Diagnosis not present

## 2021-05-17 DIAGNOSIS — N2581 Secondary hyperparathyroidism of renal origin: Secondary | ICD-10-CM | POA: Diagnosis not present

## 2021-05-17 DIAGNOSIS — N186 End stage renal disease: Secondary | ICD-10-CM | POA: Diagnosis not present

## 2021-05-17 DIAGNOSIS — D689 Coagulation defect, unspecified: Secondary | ICD-10-CM | POA: Diagnosis not present

## 2021-05-17 NOTE — Progress Notes (Signed)
° °  SUBJECTIVE Patient presents to office today complaining of elongated, thickened nails that cause pain while ambulating in shoes.  Patient is unable to trim their own nails. Patient is here for further evaluation and treatment.  Past Medical History:  Diagnosis Date   Atrial arrhythmia    atrial tachycardia with variable AV conduction versus atypical aflutter 01/10/19, rate control (02/06/19)   Cataract    Dyspnea    on exertion   ESRD (end stage renal disease) ([place])    Hemo- MWF, Polycystic kidney disease   Fatigue    History of kidney stones    removal of stone- cysto   Hyperlipidemia    Hyperparathyroidism, secondary renal ([place])    Hypertension    Hypoxemia 12/12/2013   Nonischemic cardiomyopathy ([place])    Er 25% 2015, 55 % 2013   OSA on CPAP    no longer using cpap   OSA on CPAP 03/24/2014   Pneumonia    2015ish   Prostate cancer [facility])    Ventricular tachycardia//Freq PVCs    Wears glasses     OBJECTIVE General Patient is awake, alert, and oriented x 3 and in no acute distress. Derm Skin is dry and supple bilateral. Negative open lesions or macerations. Remaining integument unremarkable. Nails are tender, long, thickened and dystrophic with subungual debris, consistent with onychomycosis, 1-5 bilateral. No signs of infection noted. Vasc  DP and PT pedal pulses palpable bilaterally. Temperature gradient within normal limits.  Neuro Epicritic and protective threshold sensation grossly intact bilaterally.  Musculoskeletal Exam No symptomatic pedal deformities noted bilateral. Muscular strength within normal limits.  ASSESSMENT 1.  Pain due to onychomycosis of toenails both  PLAN OF CARE 1. Patient evaluated today.  2. Instructed to maintain good pedal hygiene and foot care.  3. Mechanical debridement of nails 1-5 bilaterally performed using a nail nipper. Filed with dremel without incident.  4. Return to clinic in 3 mos.    Edrick Kins, DPM [facility]  Dr. Edrick Kins, DPM    [address], [place] [address]                Office [phone]  Fax [phone]

## 2021-05-19 ENCOUNTER — Telehealth: Payer: Self-pay | Admitting: Pharmacist

## 2021-05-19 DIAGNOSIS — N2581 Secondary hyperparathyroidism of renal origin: Secondary | ICD-10-CM | POA: Diagnosis not present

## 2021-05-19 DIAGNOSIS — N186 End stage renal disease: Secondary | ICD-10-CM | POA: Diagnosis not present

## 2021-05-19 DIAGNOSIS — Z992 Dependence on renal dialysis: Secondary | ICD-10-CM | POA: Diagnosis not present

## 2021-05-19 DIAGNOSIS — D689 Coagulation defect, unspecified: Secondary | ICD-10-CM | POA: Diagnosis not present

## 2021-05-19 NOTE — Telephone Encounter (Signed)
Called pt to follow up with BP. He has been feeling better, denies dizziness, dialysis has not had to stop their sessions for low BP since his losartan was decreased to 12.5mg  daily (also off spironolactone because of this and K of 5.3). Recalls BP of 117/80. Will continue current meds.

## 2021-05-21 DIAGNOSIS — Z992 Dependence on renal dialysis: Secondary | ICD-10-CM | POA: Diagnosis not present

## 2021-05-21 DIAGNOSIS — D689 Coagulation defect, unspecified: Secondary | ICD-10-CM | POA: Diagnosis not present

## 2021-05-21 DIAGNOSIS — N186 End stage renal disease: Secondary | ICD-10-CM | POA: Diagnosis not present

## 2021-05-21 DIAGNOSIS — N2581 Secondary hyperparathyroidism of renal origin: Secondary | ICD-10-CM | POA: Diagnosis not present

## 2021-05-24 DIAGNOSIS — Z992 Dependence on renal dialysis: Secondary | ICD-10-CM | POA: Diagnosis not present

## 2021-05-24 DIAGNOSIS — D689 Coagulation defect, unspecified: Secondary | ICD-10-CM | POA: Diagnosis not present

## 2021-05-24 DIAGNOSIS — N2581 Secondary hyperparathyroidism of renal origin: Secondary | ICD-10-CM | POA: Diagnosis not present

## 2021-05-24 DIAGNOSIS — N186 End stage renal disease: Secondary | ICD-10-CM | POA: Diagnosis not present

## 2021-05-26 DIAGNOSIS — D689 Coagulation defect, unspecified: Secondary | ICD-10-CM | POA: Diagnosis not present

## 2021-05-26 DIAGNOSIS — Z992 Dependence on renal dialysis: Secondary | ICD-10-CM | POA: Diagnosis not present

## 2021-05-26 DIAGNOSIS — N2581 Secondary hyperparathyroidism of renal origin: Secondary | ICD-10-CM | POA: Diagnosis not present

## 2021-05-26 DIAGNOSIS — N186 End stage renal disease: Secondary | ICD-10-CM | POA: Diagnosis not present

## 2021-05-28 DIAGNOSIS — D689 Coagulation defect, unspecified: Secondary | ICD-10-CM | POA: Diagnosis not present

## 2021-05-28 DIAGNOSIS — Z992 Dependence on renal dialysis: Secondary | ICD-10-CM | POA: Diagnosis not present

## 2021-05-28 DIAGNOSIS — N2581 Secondary hyperparathyroidism of renal origin: Secondary | ICD-10-CM | POA: Diagnosis not present

## 2021-05-28 DIAGNOSIS — N186 End stage renal disease: Secondary | ICD-10-CM | POA: Diagnosis not present

## 2021-05-29 ENCOUNTER — Other Ambulatory Visit: Payer: Self-pay | Admitting: Internal Medicine

## 2021-05-31 DIAGNOSIS — D689 Coagulation defect, unspecified: Secondary | ICD-10-CM | POA: Diagnosis not present

## 2021-05-31 DIAGNOSIS — Z992 Dependence on renal dialysis: Secondary | ICD-10-CM | POA: Diagnosis not present

## 2021-05-31 DIAGNOSIS — N2581 Secondary hyperparathyroidism of renal origin: Secondary | ICD-10-CM | POA: Diagnosis not present

## 2021-05-31 DIAGNOSIS — N186 End stage renal disease: Secondary | ICD-10-CM | POA: Diagnosis not present

## 2021-05-31 NOTE — Telephone Encounter (Signed)
Eliquis 5 mg refill request received. Patient is 66 years old, weight- 89.4 kg, Crea- 7.22 on 04/22/21, Diagnosis- afib, and last seen by Dr. Ali Lowe on 04/01/21. Dose is appropriate based on dosing criteria. Will send in refill to requested pharmacy.

## 2021-06-02 DIAGNOSIS — N186 End stage renal disease: Secondary | ICD-10-CM | POA: Diagnosis not present

## 2021-06-02 DIAGNOSIS — S8001XA Contusion of right knee, initial encounter: Secondary | ICD-10-CM | POA: Diagnosis not present

## 2021-06-02 DIAGNOSIS — N2581 Secondary hyperparathyroidism of renal origin: Secondary | ICD-10-CM | POA: Diagnosis not present

## 2021-06-02 DIAGNOSIS — Q612 Polycystic kidney, adult type: Secondary | ICD-10-CM | POA: Diagnosis not present

## 2021-06-02 DIAGNOSIS — Z992 Dependence on renal dialysis: Secondary | ICD-10-CM | POA: Diagnosis not present

## 2021-06-02 DIAGNOSIS — D509 Iron deficiency anemia, unspecified: Secondary | ICD-10-CM | POA: Diagnosis not present

## 2021-06-04 DIAGNOSIS — S8001XA Contusion of right knee, initial encounter: Secondary | ICD-10-CM | POA: Diagnosis not present

## 2021-06-04 DIAGNOSIS — D509 Iron deficiency anemia, unspecified: Secondary | ICD-10-CM | POA: Diagnosis not present

## 2021-06-04 DIAGNOSIS — N2581 Secondary hyperparathyroidism of renal origin: Secondary | ICD-10-CM | POA: Diagnosis not present

## 2021-06-04 DIAGNOSIS — N186 End stage renal disease: Secondary | ICD-10-CM | POA: Diagnosis not present

## 2021-06-04 DIAGNOSIS — Z992 Dependence on renal dialysis: Secondary | ICD-10-CM | POA: Diagnosis not present

## 2021-06-07 DIAGNOSIS — N186 End stage renal disease: Secondary | ICD-10-CM | POA: Diagnosis not present

## 2021-06-07 DIAGNOSIS — Z992 Dependence on renal dialysis: Secondary | ICD-10-CM | POA: Diagnosis not present

## 2021-06-07 DIAGNOSIS — D509 Iron deficiency anemia, unspecified: Secondary | ICD-10-CM | POA: Diagnosis not present

## 2021-06-07 DIAGNOSIS — N2581 Secondary hyperparathyroidism of renal origin: Secondary | ICD-10-CM | POA: Diagnosis not present

## 2021-06-09 DIAGNOSIS — N2581 Secondary hyperparathyroidism of renal origin: Secondary | ICD-10-CM | POA: Diagnosis not present

## 2021-06-09 DIAGNOSIS — Z992 Dependence on renal dialysis: Secondary | ICD-10-CM | POA: Diagnosis not present

## 2021-06-09 DIAGNOSIS — N186 End stage renal disease: Secondary | ICD-10-CM | POA: Diagnosis not present

## 2021-06-09 DIAGNOSIS — D509 Iron deficiency anemia, unspecified: Secondary | ICD-10-CM | POA: Diagnosis not present

## 2021-06-11 DIAGNOSIS — N2581 Secondary hyperparathyroidism of renal origin: Secondary | ICD-10-CM | POA: Diagnosis not present

## 2021-06-11 DIAGNOSIS — N186 End stage renal disease: Secondary | ICD-10-CM | POA: Diagnosis not present

## 2021-06-11 DIAGNOSIS — Z992 Dependence on renal dialysis: Secondary | ICD-10-CM | POA: Diagnosis not present

## 2021-06-11 DIAGNOSIS — D509 Iron deficiency anemia, unspecified: Secondary | ICD-10-CM | POA: Diagnosis not present

## 2021-06-14 DIAGNOSIS — N186 End stage renal disease: Secondary | ICD-10-CM | POA: Diagnosis not present

## 2021-06-14 DIAGNOSIS — Z992 Dependence on renal dialysis: Secondary | ICD-10-CM | POA: Diagnosis not present

## 2021-06-14 DIAGNOSIS — N2581 Secondary hyperparathyroidism of renal origin: Secondary | ICD-10-CM | POA: Diagnosis not present

## 2021-06-14 DIAGNOSIS — D509 Iron deficiency anemia, unspecified: Secondary | ICD-10-CM | POA: Diagnosis not present

## 2021-06-15 DIAGNOSIS — I120 Hypertensive chronic kidney disease with stage 5 chronic kidney disease or end stage renal disease: Secondary | ICD-10-CM | POA: Diagnosis not present

## 2021-06-15 DIAGNOSIS — I493 Ventricular premature depolarization: Secondary | ICD-10-CM | POA: Diagnosis not present

## 2021-06-15 DIAGNOSIS — D696 Thrombocytopenia, unspecified: Secondary | ICD-10-CM | POA: Diagnosis not present

## 2021-06-15 DIAGNOSIS — Z205 Contact with and (suspected) exposure to viral hepatitis: Secondary | ICD-10-CM | POA: Diagnosis not present

## 2021-06-15 DIAGNOSIS — I440 Atrioventricular block, first degree: Secondary | ICD-10-CM | POA: Diagnosis not present

## 2021-06-15 DIAGNOSIS — Z8546 Personal history of malignant neoplasm of prostate: Secondary | ICD-10-CM | POA: Diagnosis not present

## 2021-06-15 DIAGNOSIS — Z992 Dependence on renal dialysis: Secondary | ICD-10-CM | POA: Diagnosis not present

## 2021-06-15 DIAGNOSIS — I428 Other cardiomyopathies: Secondary | ICD-10-CM | POA: Diagnosis not present

## 2021-06-15 DIAGNOSIS — Z0000 Encounter for general adult medical examination without abnormal findings: Secondary | ICD-10-CM | POA: Diagnosis not present

## 2021-06-15 DIAGNOSIS — M109 Gout, unspecified: Secondary | ICD-10-CM | POA: Diagnosis not present

## 2021-06-15 DIAGNOSIS — Z1389 Encounter for screening for other disorder: Secondary | ICD-10-CM | POA: Diagnosis not present

## 2021-06-15 DIAGNOSIS — E782 Mixed hyperlipidemia: Secondary | ICD-10-CM | POA: Diagnosis not present

## 2021-06-15 DIAGNOSIS — N186 End stage renal disease: Secondary | ICD-10-CM | POA: Diagnosis not present

## 2021-06-16 DIAGNOSIS — N186 End stage renal disease: Secondary | ICD-10-CM | POA: Diagnosis not present

## 2021-06-16 DIAGNOSIS — N2581 Secondary hyperparathyroidism of renal origin: Secondary | ICD-10-CM | POA: Diagnosis not present

## 2021-06-16 DIAGNOSIS — D509 Iron deficiency anemia, unspecified: Secondary | ICD-10-CM | POA: Diagnosis not present

## 2021-06-16 DIAGNOSIS — Z992 Dependence on renal dialysis: Secondary | ICD-10-CM | POA: Diagnosis not present

## 2021-06-18 DIAGNOSIS — N186 End stage renal disease: Secondary | ICD-10-CM | POA: Diagnosis not present

## 2021-06-18 DIAGNOSIS — Z992 Dependence on renal dialysis: Secondary | ICD-10-CM | POA: Diagnosis not present

## 2021-06-18 DIAGNOSIS — D509 Iron deficiency anemia, unspecified: Secondary | ICD-10-CM | POA: Diagnosis not present

## 2021-06-18 DIAGNOSIS — N2581 Secondary hyperparathyroidism of renal origin: Secondary | ICD-10-CM | POA: Diagnosis not present

## 2021-06-21 DIAGNOSIS — N186 End stage renal disease: Secondary | ICD-10-CM | POA: Diagnosis not present

## 2021-06-21 DIAGNOSIS — N2581 Secondary hyperparathyroidism of renal origin: Secondary | ICD-10-CM | POA: Diagnosis not present

## 2021-06-21 DIAGNOSIS — D509 Iron deficiency anemia, unspecified: Secondary | ICD-10-CM | POA: Diagnosis not present

## 2021-06-21 DIAGNOSIS — Z992 Dependence on renal dialysis: Secondary | ICD-10-CM | POA: Diagnosis not present

## 2021-06-23 DIAGNOSIS — Z992 Dependence on renal dialysis: Secondary | ICD-10-CM | POA: Diagnosis not present

## 2021-06-23 DIAGNOSIS — N186 End stage renal disease: Secondary | ICD-10-CM | POA: Diagnosis not present

## 2021-06-23 DIAGNOSIS — N2581 Secondary hyperparathyroidism of renal origin: Secondary | ICD-10-CM | POA: Diagnosis not present

## 2021-06-23 DIAGNOSIS — D509 Iron deficiency anemia, unspecified: Secondary | ICD-10-CM | POA: Diagnosis not present

## 2021-06-25 DIAGNOSIS — D509 Iron deficiency anemia, unspecified: Secondary | ICD-10-CM | POA: Diagnosis not present

## 2021-06-25 DIAGNOSIS — N2581 Secondary hyperparathyroidism of renal origin: Secondary | ICD-10-CM | POA: Diagnosis not present

## 2021-06-25 DIAGNOSIS — Z992 Dependence on renal dialysis: Secondary | ICD-10-CM | POA: Diagnosis not present

## 2021-06-25 DIAGNOSIS — N186 End stage renal disease: Secondary | ICD-10-CM | POA: Diagnosis not present

## 2021-06-30 DIAGNOSIS — Z992 Dependence on renal dialysis: Secondary | ICD-10-CM | POA: Diagnosis not present

## 2021-06-30 DIAGNOSIS — N2581 Secondary hyperparathyroidism of renal origin: Secondary | ICD-10-CM | POA: Diagnosis not present

## 2021-06-30 DIAGNOSIS — H34832 Tributary (branch) retinal vein occlusion, left eye, with macular edema: Secondary | ICD-10-CM | POA: Diagnosis not present

## 2021-06-30 DIAGNOSIS — B0051 Herpesviral iridocyclitis: Secondary | ICD-10-CM | POA: Diagnosis not present

## 2021-06-30 DIAGNOSIS — Q612 Polycystic kidney, adult type: Secondary | ICD-10-CM | POA: Diagnosis not present

## 2021-06-30 DIAGNOSIS — H40013 Open angle with borderline findings, low risk, bilateral: Secondary | ICD-10-CM | POA: Diagnosis not present

## 2021-06-30 DIAGNOSIS — N186 End stage renal disease: Secondary | ICD-10-CM | POA: Diagnosis not present

## 2021-06-30 DIAGNOSIS — H25813 Combined forms of age-related cataract, bilateral: Secondary | ICD-10-CM | POA: Diagnosis not present

## 2021-07-02 DIAGNOSIS — N2581 Secondary hyperparathyroidism of renal origin: Secondary | ICD-10-CM | POA: Diagnosis not present

## 2021-07-02 DIAGNOSIS — N186 End stage renal disease: Secondary | ICD-10-CM | POA: Diagnosis not present

## 2021-07-02 DIAGNOSIS — Z992 Dependence on renal dialysis: Secondary | ICD-10-CM | POA: Diagnosis not present

## 2021-07-05 DIAGNOSIS — N2581 Secondary hyperparathyroidism of renal origin: Secondary | ICD-10-CM | POA: Diagnosis not present

## 2021-07-05 DIAGNOSIS — N186 End stage renal disease: Secondary | ICD-10-CM | POA: Diagnosis not present

## 2021-07-05 DIAGNOSIS — Z992 Dependence on renal dialysis: Secondary | ICD-10-CM | POA: Diagnosis not present

## 2021-07-07 DIAGNOSIS — N186 End stage renal disease: Secondary | ICD-10-CM | POA: Diagnosis not present

## 2021-07-07 DIAGNOSIS — Z992 Dependence on renal dialysis: Secondary | ICD-10-CM | POA: Diagnosis not present

## 2021-07-07 DIAGNOSIS — N2581 Secondary hyperparathyroidism of renal origin: Secondary | ICD-10-CM | POA: Diagnosis not present

## 2021-07-07 MED ORDER — LOSARTAN POTASSIUM 25 MG PO TABS: 12.5000 mg | ORAL_TABLET | Freq: Every day | ORAL | 3 refills | Status: DC

## 2021-07-07 NOTE — Addendum Note (Signed)
Addended by: Tempestt Silba E on: 07/07/2021 07:52 AM ? ? Modules accepted: Orders ? ?

## 2021-07-09 DIAGNOSIS — N2581 Secondary hyperparathyroidism of renal origin: Secondary | ICD-10-CM | POA: Diagnosis not present

## 2021-07-09 DIAGNOSIS — N186 End stage renal disease: Secondary | ICD-10-CM | POA: Diagnosis not present

## 2021-07-09 DIAGNOSIS — Z992 Dependence on renal dialysis: Secondary | ICD-10-CM | POA: Diagnosis not present

## 2021-07-13 DIAGNOSIS — N186 End stage renal disease: Secondary | ICD-10-CM | POA: Diagnosis not present

## 2021-07-13 DIAGNOSIS — Z992 Dependence on renal dialysis: Secondary | ICD-10-CM | POA: Diagnosis not present

## 2021-07-13 DIAGNOSIS — N2581 Secondary hyperparathyroidism of renal origin: Secondary | ICD-10-CM | POA: Diagnosis not present

## 2021-07-14 ENCOUNTER — Other Ambulatory Visit: Payer: Self-pay | Admitting: *Deleted

## 2021-07-14 DIAGNOSIS — N186 End stage renal disease: Secondary | ICD-10-CM | POA: Diagnosis not present

## 2021-07-14 DIAGNOSIS — N2581 Secondary hyperparathyroidism of renal origin: Secondary | ICD-10-CM | POA: Diagnosis not present

## 2021-07-14 DIAGNOSIS — Z992 Dependence on renal dialysis: Secondary | ICD-10-CM | POA: Diagnosis not present

## 2021-07-14 NOTE — Patient Outreach (Signed)
[facility]) Care Management ? ?07/14/2021 ? ?[name] ?1955-07-15 ?[id number] ? ?Referral: 07/12/2021 ?Telephone Assessment-Declined ? ?RN completed an outreach today and spoke with the pt. Introduced [facility] services and the purpose for today's call. Inquired if RN could complete an assessment however pt hesitant and limited with his response.  ? ?Further explained [facility] related to his insurance company and explained this information is confidential and offered to send information to his home address concerning [facility] services (declined). Offered to give him time to review and call back with additional information or address any questions he may have at that time (decline). ? ?RN attempted enrollment to address any of his medical conditions. Pt states he goes to the dialysis center and this was enough for him and adamantly declined any [facility] services at this time.  RN offered further explained [facility] services does not need to be home visits with all telephonic calls monthly, quarterly event semi-annual if requested and RN would be able to send his a new blood pressure device for closer monitoring. Again pt declined and did not wish to proceed with the call and abruptly ending the call.  ? ?Base upon the call today will close this case at this time. ? ?Raina Mina, RN ?Care Management Coordinator ?[name] ?Main Office [phone]  ?

## 2021-07-15 DIAGNOSIS — Z992 Dependence on renal dialysis: Secondary | ICD-10-CM | POA: Diagnosis not present

## 2021-07-15 DIAGNOSIS — N2581 Secondary hyperparathyroidism of renal origin: Secondary | ICD-10-CM | POA: Diagnosis not present

## 2021-07-15 DIAGNOSIS — N186 End stage renal disease: Secondary | ICD-10-CM | POA: Diagnosis not present

## 2021-07-16 NOTE — Progress Notes (Signed)
?[facility] Note ? ?07/20/2021 ?  ? ?CHIEF COMPLAINT ?Patient presents for Retina Evaluation ? ? ?HISTORY OF PRESENT ILLNESS: ?Michael [name]. is a 66 y.o. male who presents to the clinic today for:  ? ?HPI   ? ? Retina Evaluation   ?In left eye.  I, the attending physician,  performed the HPI with the patient and updated documentation appropriately. ? ?  ?  ? ? Comments   ?Patient here for Retina Evaluation. Referred by Dr Michael Lei. Patient states vision blurry OS. OD is decent. Hadn't been here in a while was going through prostate cancer treatment. Sometimes OS hurts at the top. Dr Michael Lei saw bleeding in OS. Was put on Dorzolamide/Timolol BID OU and acyclovir.  ? ?  ?  ?Last edited by [name], MD on 07/20/2021 12:47 PM.  ?  ?pt has been lost to follow up due to undergoing prostate cancer treatments, he states he underwent radiation and is "good" now, pt states his left eye vision decreased "all of a sudden", pt saw Dr. Lucianne Lei who told him she saw bleeding behind his eye, Dr. Lucianne Lei also put the pt on Cosopt BID OU and acyclovir ? ?Referring physician: ?Michael Pick, MD ?Michael ?[address] [name],  [place] [id number] ? ?HISTORICAL INFORMATION:  ? ?Selected notes from the [facility] ?Referred by Dr. [name]. [name] for concern of HRVO OD; ?LEE- 11.30.18 (Michael [name]) {BCVA OD: 20/100-1 OS: 20/20-2] ?Ocular Hx- cataract OU ?PMH- HTN, high chol, kidney disease, sleep apnea, emphysema ?  ? ?CURRENT MEDICATIONS: ?No current outpatient medications on file. (Ophthalmic Drugs)  ? ?No current facility-administered medications for this visit. (Ophthalmic Drugs)  ? ?Current Outpatient Medications (Other)  ?Medication Sig  ? acyclovir (ZOVIRAX) 400 MG tablet Take 400 mg by mouth 2 (two) times daily.  ? apixaban (ELIQUIS) 5 MG TABS tablet Take 1 tablet by mouth twice daily  ? cinacalcet (SENSIPAR) 30 MG tablet Take 30 mg by mouth 3 (three) times a week.  ? losartan (COZAAR) 25 MG tablet Take 0.5  tablets (12.5 mg total) by mouth daily.  ? metoprolol succinate (TOPROL XL) 25 MG 24 hr tablet Take 1 tablet (25 mg total) by mouth at bedtime.  ? rosuvastatin (CRESTOR) 10 MG tablet Take 1 tablet (10 mg total) by mouth daily.  ? sevelamer carbonate (RENVELA) 800 MG tablet Take 800 mg by mouth 3 (three) times daily with meals.  ? clotrimazole-betamethasone (LOTRISONE) cream Apply 1 application topically 2 (two) times daily. (Patient not taking: Reported on 07/20/2021)  ? nitroGLYCERIN (NITROSTAT) 0.4 MG SL tablet Place 1 tablet (0.4 mg total) under the tongue every 5 (five) minutes x 3 doses as needed for chest pain. (Patient not taking: Reported on 04/22/2021)  ? ?No current facility-administered medications for this visit. (Other)  ? ?REVIEW OF SYSTEMS: ?ROS   ?Positive for: Genitourinary, Cardiovascular, Eyes, Respiratory ?Negative for: Constitutional, Gastrointestinal, Neurological, Skin, Musculoskeletal, HENT, Endocrine, Psychiatric, Allergic/Imm, Heme/Lymph ?Last edited by Michael [name], COA on 07/20/2021  8:23 AM.  ?  ? ?ALLERGIES ?Allergies  ?Allergen Reactions  ? Indapamide Other (See Comments)  ? ? ?PAST MEDICAL HISTORY ?Past Medical History:  ?Diagnosis Date  ? Atrial arrhythmia   ? atrial tachycardia with variable AV conduction versus atypical aflutter 01/10/19, rate control (02/06/19)  ? Cataract   ? Dyspnea   ? on exertion  ? ESRD (end stage renal disease) ([place])   ? Hemo- MWF, Polycystic kidney disease  ? Fatigue   ?  History of kidney stones   ? removal of stone- cysto  ? Hyperlipidemia   ? Hyperparathyroidism, secondary renal ([place])   ? Hypertension   ? Hypoxemia 12/12/2013  ? Nonischemic cardiomyopathy ([place])   ? Er 25% 2015, 55 % 2013  ? OSA on CPAP   ? no longer using cpap  ? OSA on CPAP 03/24/2014  ? Pneumonia   ? 2015ish  ? Prostate cancer ([place])   ? Ventricular tachycardia//Freq PVCs   ? Wears glasses   ? ?Past Surgical History:  ?Procedure Laterality Date  ? A-FLUTTER ABLATION N/A 04/11/2019  ?  Procedure: A-FLUTTER ABLATION;  Surgeon: Evans Lance, MD;  Location: [facility];  Service: Cardiovascular;  Laterality: N/A;  ? A/V FISTULAGRAM Left 04/27/2017  ? Procedure: A/V FISTULAGRAM;  Surgeon: Conrad Barnum, MD;  Location: [facility];  Service: Cardiovascular;  Laterality: Left;  lt arm  ? A/V FISTULAGRAM Left 01/10/2019  ? Procedure: A/V FISTULAGRAM;  Surgeon: Marty Heck, MD;  Location: [facility];  Service: Cardiovascular;  Laterality: Left;  ? APPENDECTOMY    ? AV FISTULA PLACEMENT  12/05/2011  ? Procedure: ARTERIOVENOUS (AV) FISTULA CREATION;LLEFT ARM  Surgeon: Conrad Meagher, MD;  Location: [facility];  Service: Vascular;  Laterality: Left;  RADIO-CEPHALIC  fistula left arm  ? AV FISTULA PLACEMENT  01/11/2012  ? Procedure: ARTERIOVENOUS (AV) FISTULA CREATION;  Surgeon: Conrad Haines, MD;  Location: [facility];  Service: Vascular;  Laterality: Left;  Creation of left brachial cephalic arteriovenous fistula  ? AV FISTULA PLACEMENT Right 03/07/2019  ? Procedure: ARTERIOVENOUS (AV) FISTULA CREATION  RIGHT ARM;  Surgeon: Marty Heck, MD;  Location: [facility] [place];  Service: Vascular;  Laterality: Right;  ? BASCILIC VEIN TRANSPOSITION Left 12/27/2016  ? Procedure: BASILIC VEIN TRANSPOSITION LEFT UPPER ARM FIRST STAGE;  Surgeon: Conrad Weweantic, MD;  Location: [facility];  Service: Vascular;  Laterality: Left;  ? BASCILIC VEIN TRANSPOSITION Left 01/31/2017  ? Procedure: LEFT ARM BASILIC VEIN TRANSPOSITION, SECOND STAGE;  Surgeon: Conrad [place], MD;  Location: [facility];  Service: Vascular;  Laterality: Left;  ? CARDIAC CATHETERIZATION  04-05-2010  ? checking for blockage but none-[facility]  ? COLONOSCOPY    ? CYSTOSCOPY W/ STONE MANIPULATION    ? "laser once" (01/22/2013)  ? HEMATOMA EVACUATION Left 05/09/2017  ? Procedure: EVACUATION HEMATOMA LEFT ARM;  Surgeon: Conrad Champ, MD;  Location: [facility];  Service: Vascular;  Laterality: Left;  ? HERNIA REPAIR    ? INGUINAL HERNIA REPAIR Right 11/06/2015  ? Procedure:  OPEN HERNIA REPAIR  RIGHT INGUINAL ADULT;  Surgeon: [name], MD;  Location: [facility];  Service: General;  Laterality: Right;  with MESH  ? INSERTION OF DIALYSIS CATHETER Right 10/05/2016  ? Procedure: INSERTION OF right internal jugular DIALYSIS CATHETER;  Surgeon: [name], MD;  Location: [facility];  Service: Vascular;  Laterality: Right;  ? INSERTION OF MESH  03/20/2012  ? Procedure: INSERTION OF MESH;  UMB Surgeon: [name], MD;  Location: [facility] [place];  Service: General;  Laterality: N/A;  ? INSERTION OF MESH N/A 01/22/2013  ? Procedure: INSERTION OF MESH;  Surgeon: [name], MD;  Location: [facility] [place];  Service: General;  Laterality: N/A;  ? LAPAROTOMY  04/02/2012  ? Procedure: EXPLORATORY LAPAROTOMY;  Surgeon: [name], MD;  Location: [facility];  Service: General;  Laterality: N/A;  Exploratory Laparotomy with resection of small intestine  ? LEFT HEART CATHETERIZATION WITH CORONARY ANGIOGRAM  N/A 05/14/2013  ? Procedure: LEFT HEART CATHETERIZATION WITH CORONARY ANGIOGRAM;  Surgeon: Sinclair Grooms, MD;  Location: [facility] LAB;  Service: Cardiovascular;  Laterality: N/A;  ? LIGATION OF ARTERIOVENOUS  FISTULA Left 12/27/2016  ? Procedure: LIGATION/EXCISION OF LEFT UPPER ARM ARTERIOVENOUS  FISTULA;  EVACUATION OF HEMATOMA;  Surgeon: Conrad Berkley, MD;  Location: [facility];  Service: Vascular;  Laterality: Left;  ? LIGATION OF ARTERIOVENOUS  FISTULA Left 03/07/2019  ? Procedure: LIGATION OF ARTERIOVENOUS FISTULA  LEFT UPPER ARM;  Surgeon: Marty Heck, MD;  Location: [facility];  Service: Vascular;  Laterality: Left;  ? REVISON OF ARTERIOVENOUS FISTULA Left 10/05/2016  ? Procedure: REVISON OF left arm ARTERIOVENOUS FISTULA;  Surgeon: [name], MD;  [facility];  Service: Vascular;  Laterality: Left;  ? TONSILLECTOMY    ? UMBILICAL HERNIA REPAIR  03/20/2012  ? Procedure: HERNIA REPAIR UMBILICAL ADULT;  Surgeon: [name], MD;  Location: [facility];  Service: General;  Laterality: N/A;  ? UMBILICAL  HERNIA REPAIR  01/22/2013  ? preperitoneal open procedure due to significant adhesions/notes 01/22/2013  ? VENTRAL HERNIA REPAIR N/A 01/22/2013  ? Procedure: ATTEMPTED LAPAROSCOPIC VENTRAL HERNIA CONVERTED TO OP

## 2021-07-17 DIAGNOSIS — Z992 Dependence on renal dialysis: Secondary | ICD-10-CM | POA: Diagnosis not present

## 2021-07-17 DIAGNOSIS — N186 End stage renal disease: Secondary | ICD-10-CM | POA: Diagnosis not present

## 2021-07-17 DIAGNOSIS — N2581 Secondary hyperparathyroidism of renal origin: Secondary | ICD-10-CM | POA: Diagnosis not present

## 2021-07-20 ENCOUNTER — Other Ambulatory Visit: Payer: Self-pay

## 2021-07-20 ENCOUNTER — Encounter (INDEPENDENT_AMBULATORY_CARE_PROVIDER_SITE_OTHER): Payer: Self-pay | Admitting: Ophthalmology

## 2021-07-20 ENCOUNTER — Ambulatory Visit (INDEPENDENT_AMBULATORY_CARE_PROVIDER_SITE_OTHER): Payer: Medicare Other | Admitting: Ophthalmology

## 2021-07-20 DIAGNOSIS — H43813 Vitreous degeneration, bilateral: Secondary | ICD-10-CM | POA: Diagnosis not present

## 2021-07-20 DIAGNOSIS — H35412 Lattice degeneration of retina, left eye: Secondary | ICD-10-CM

## 2021-07-20 DIAGNOSIS — H33332 Multiple defects of retina without detachment, left eye: Secondary | ICD-10-CM | POA: Diagnosis not present

## 2021-07-20 DIAGNOSIS — H34831 Tributary (branch) retinal vein occlusion, right eye, with macular edema: Secondary | ICD-10-CM

## 2021-07-20 DIAGNOSIS — H209 Unspecified iridocyclitis: Secondary | ICD-10-CM | POA: Diagnosis not present

## 2021-07-20 DIAGNOSIS — N186 End stage renal disease: Secondary | ICD-10-CM | POA: Diagnosis not present

## 2021-07-20 DIAGNOSIS — H40052 Ocular hypertension, left eye: Secondary | ICD-10-CM

## 2021-07-20 DIAGNOSIS — N2581 Secondary hyperparathyroidism of renal origin: Secondary | ICD-10-CM | POA: Diagnosis not present

## 2021-07-20 DIAGNOSIS — H1820 Unspecified corneal edema: Secondary | ICD-10-CM

## 2021-07-20 DIAGNOSIS — H34833 Tributary (branch) retinal vein occlusion, bilateral, with macular edema: Secondary | ICD-10-CM

## 2021-07-20 DIAGNOSIS — H35033 Hypertensive retinopathy, bilateral: Secondary | ICD-10-CM | POA: Diagnosis not present

## 2021-07-20 DIAGNOSIS — H34832 Tributary (branch) retinal vein occlusion, left eye, with macular edema: Secondary | ICD-10-CM

## 2021-07-20 DIAGNOSIS — I10 Essential (primary) hypertension: Secondary | ICD-10-CM | POA: Diagnosis not present

## 2021-07-20 DIAGNOSIS — Z992 Dependence on renal dialysis: Secondary | ICD-10-CM | POA: Diagnosis not present

## 2021-07-20 DIAGNOSIS — B0052 Herpesviral keratitis: Secondary | ICD-10-CM | POA: Diagnosis not present

## 2021-07-20 DIAGNOSIS — H25813 Combined forms of age-related cataract, bilateral: Secondary | ICD-10-CM | POA: Diagnosis not present

## 2021-07-21 NOTE — Progress Notes (Signed)
?[facility] Note ? ?07/22/2021 ?  ? ?CHIEF COMPLAINT ?Patient presents for Retina Follow Up ? ? ?HISTORY OF PRESENT ILLNESS: ?Michael [name]. is a 66 y.o. male who presents to the clinic today for:  ? ?HPI   ? ? Retina Follow Up   ?Patient presents with  Other.  In left eye.  Duration of 2 days.  I, the attending physician,  performed the HPI with the patient and updated documentation appropriately. ? ?  ?  ? ? Comments   ?2 day follow up Ocular hypertension with corneal edema OS-  States OS has been real sensitive to light.  Vision is a little better.  ?**Patient has been using Brimonidine q2 hrs, Lotamax and Cosopt q4 hrs** ? ?  ?  ?Last edited by [name], MD on 07/22/2021 11:03 AM.  ?  ?pt states his left eye is a little painful and very light sensitive ? ?Referring physician: ?[name], MD ?[address] ?[address] ?[place],  [place] [address] ? ?HISTORICAL INFORMATION:  ? ?Selected notes from the [facility] ?Referred by Dr. [name]. Hecker for concern of HRVO OD; ?LEE- 11.30.18 ([name]) {BCVA OD: 20/100-1 OS: 20/20-2] ?Ocular Hx- cataract OU ?PMH- HTN, high chol, kidney disease, sleep apnea, emphysema ?  ? ?CURRENT MEDICATIONS: ?Current Outpatient Medications (Ophthalmic Drugs)  ?Medication Sig  ? brimonidine (ALPHAGAN) 0.2 % ophthalmic solution Place 1 drop into the left eye 3 (three) times daily.  ? dorzolamide-timolol (COSOPT) 22.3-6.8 MG/ML ophthalmic solution Place 1 drop into the left eye 3 (three) times daily.  ? Loteprednol Etabonate (LOTEMAX SM) 0.38 % GEL Place 1 drop into the left eye every 2 (two) hours.  ? ?No current facility-administered medications for this visit. (Ophthalmic Drugs)  ? ?Current Outpatient Medications (Other)  ?Medication Sig  ? acyclovir (ZOVIRAX) 400 MG tablet Take 400 mg by mouth 2 (two) times daily.  ? apixaban (ELIQUIS) 5 MG TABS tablet Take 1 tablet by mouth twice daily  ? cinacalcet (SENSIPAR) 30 MG tablet Take 30 mg by  mouth 3 (three) times a week.  ? losartan (COZAAR) 25 MG tablet Take 0.5 tablets (12.5 mg total) by mouth daily.  ? metoprolol succinate (TOPROL XL) 25 MG 24 hr tablet Take 1 tablet (25 mg total) by mouth at bedtime.  ? rosuvastatin (CRESTOR) 10 MG tablet Take 1 tablet (10 mg total) by mouth daily.  ? sevelamer carbonate (RENVELA) 800 MG tablet Take 800 mg by mouth 3 (three) times daily with meals.  ? clotrimazole-betamethasone (LOTRISONE) cream Apply 1 application topically 2 (two) times daily. (Patient not taking: Reported on 07/20/2021)  ? nitroGLYCERIN (NITROSTAT) 0.4 MG SL tablet Place 1 tablet (0.4 mg total) under the tongue every 5 (five) minutes x 3 doses as needed for chest pain. (Patient not taking: Reported on 04/22/2021)  ? ?No current facility-administered medications for this visit. (Other)  ? ?REVIEW OF SYSTEMS: ?ROS   ?Positive for: Genitourinary, Cardiovascular, Eyes, Respiratory ?Negative for: Constitutional, Gastrointestinal, Neurological, Skin, Musculoskeletal, HENT, Endocrine, Psychiatric, Allergic/Imm, Heme/Lymph ?Last edited by Leonie Douglas, [place] on 07/22/2021  9:01 AM.  ?  ? ?ALLERGIES ?Allergies  ?Allergen Reactions  ? Indapamide Other (See Comments)  ? ?PAST MEDICAL HISTORY ?Past Medical History:  ?Diagnosis Date  ? Atrial arrhythmia   ? atrial tachycardia with variable AV conduction versus atypical aflutter 01/10/19, rate control (02/06/19)  ? Cataract   ? Dyspnea   ? on exertion  ? ESRD (end stage  renal disease) ([place])   ? Hemo- MWF, Polycystic kidney disease  ? Fatigue   ? History of kidney stones   ? removal of stone- cysto  ? Hyperlipidemia   ? Hyperparathyroidism, secondary renal ([place])   ? Hypertension   ? Hypoxemia 12/12/2013  ? Nonischemic cardiomyopathy ([place])   ? Er 25% 2015, 55 % 2013  ? OSA on CPAP   ? no longer using cpap  ? OSA on CPAP 03/24/2014  ? Pneumonia   ? 2015ish  ? Prostate cancer ([place])   ? Ventricular tachycardia//Freq PVCs   ? Wears glasses   ? ?Past Surgical History:   ?Procedure Laterality Date  ? A-FLUTTER ABLATION N/A 04/11/2019  ? Procedure: A-FLUTTER ABLATION;  Surgeon: Evans Lance, MD;  Location: [place];  Service: Cardiovascular;  Laterality: N/A;  ? A/V FISTULAGRAM Left 04/27/2017  ? Procedure: A/V FISTULAGRAM;  Surgeon: Conrad Tolstoy, MD;  Location: [facility];  Service: Cardiovascular;  Laterality: Left;  lt arm  ? A/V FISTULAGRAM Left 01/10/2019  ? Procedure: A/V FISTULAGRAM;  Surgeon: Marty Heck, MD;  Location: [facility];  Service: Cardiovascular;  Laterality: Left;  ? APPENDECTOMY    ? AV FISTULA PLACEMENT  12/05/2011  ? Procedure: ARTERIOVENOUS (AV) FISTULA CREATION;LLEFT ARM  Surgeon: Conrad [place], MD;  Location: [facility];  Service: Vascular;  Laterality: Left;  RADIO-CEPHALIC  fistula left arm  ? AV FISTULA PLACEMENT  01/11/2012  ? Procedure: ARTERIOVENOUS (AV) FISTULA CREATION;  Surgeon: Conrad [place], MD;  Location: [facility] [place];  Service: Vascular;  Laterality: Left;  Creation of left brachial cephalic arteriovenous fistula  ? AV FISTULA PLACEMENT Right 03/07/2019  ? Procedure: ARTERIOVENOUS (AV) FISTULA CREATION  RIGHT ARM;  Surgeon: Marty Heck, MD;  Location: [facility];  Service: Vascular;  Laterality: Right;  ? BASCILIC VEIN TRANSPOSITION Left 12/27/2016  ? Procedure: BASILIC VEIN TRANSPOSITION LEFT UPPER ARM FIRST STAGE;  Surgeon: Conrad Welda, MD;  Location: [facility];  Service: Vascular;  Laterality: Left;  ? BASCILIC VEIN TRANSPOSITION Left 01/31/2017  ? Procedure: LEFT ARM BASILIC VEIN TRANSPOSITION, SECOND STAGE;  Surgeon: Conrad , MD;  Location: [facility];  Service: Vascular;  Laterality: Left;  ? CARDIAC CATHETERIZATION  04-05-2010  ? checking for blockage but none-[facility]  ? COLONOSCOPY    ? CYSTOSCOPY W/ STONE MANIPULATION    ? "laser once" (01/22/2013)  ? HEMATOMA EVACUATION Left 05/09/2017  ? Procedure: EVACUATION HEMATOMA LEFT ARM;  Surgeon: Conrad , MD;  Location: [facility];  Service: Vascular;  Laterality: Left;  ?  HERNIA REPAIR    ? INGUINAL HERNIA REPAIR Right 11/06/2015  ? Procedure: OPEN HERNIA REPAIR  RIGHT INGUINAL ADULT;  Surgeon: [name], MD;  [facility];  Service: General;  Laterality: Right;  with MESH  ? INSERTION OF DIALYSIS CATHETER Right 10/05/2016  ? Procedure: INSERTION OF right internal jugular DIALYSIS CATHETER;  Surgeon: [name], MD;  Location: [facility];  Service: Vascular;  Laterality: Right;  ? INSERTION OF MESH  03/20/2012  ? Procedure: INSERTION OF MESH;  UMB Surgeon: [name], MD;  Location: [facility];  Service: General;  Laterality: N/A;  ? INSERTION OF MESH N/A 01/22/2013  ? Procedure: INSERTION OF MESH;  Surgeon: [name], MD;  Location: [facility];  Service: General;  Laterality: N/A;  ? LAPAROTOMY  04/02/2012  ? Procedure: EXPLORATORY LAPAROTOMY;  Surgeon: [name], MD;  Location: [facility];  Service: General;  Laterality:  N/A;  Exploratory Laparotomy with resection of small intestine  ? LEFT HEART CATHETERIZATION WITH CORONARY ANGIOGRAM N/A 05/14/2013  ? Procedure: LEFT HEART CATHETERIZATION WITH CORONARY ANGIOGRAM;  Surgeon: Sinclair Grooms, MD;  Location[facility] C[facility];  Service: Cardiovascular;  Laterality: N/A;  ? LIGATION OF ARTERIOVENOUS  FISTULA Left 12/27/2016  ? Procedure: LIGATION/EXCISION OF LEFT UPPER ARM ARTERIOVENOUS  FISTULA;  EVACUATION OF HEMATOMA;  Surgeon: Conrad [place], MD;  Location: [facility];  Service: Vascular;  Laterality: Left;  ? LIGATION OF ARTERIOVENOUS  FISTULA Left 03/07/2019  ? Procedure: LIGATION OF ARTERIOVENOUS FISTULA  LEFT UPPER ARM;  Surgeon: Marty Heck, MD;  Location: [facility];  Service: Vascular;  Laterality: Left;  ? REVISON OF ARTERIOVENOUS FISTULA Left 10/05/2016  ? Procedure: REVISON OF left arm ARTERIOVENOUS FISTULA;  Surgeon: [name], MD;  Location: [facility];  Service: Vascular;  Laterality: Left;  ? TONSILLECTOMY    ? UMBILICAL HERNIA REPAIR  03/20/2012  ? Procedure: HERNIA REPAIR UMBILICAL ADULT;  Surgeon: [name],  MD;  Location: [facility];  Service: General;  Laterality: N/A;  ? UMBILICAL HERNIA REPAIR  01/22/2013  ? preperitoneal open procedure due to significant adhesions/notes 01/22/2013  ? VENTRAL HERNIA REPAIR N/A 9/23/201

## 2021-07-22 ENCOUNTER — Inpatient Hospital Stay: Payer: BC Managed Care – PPO | Admitting: Oncology

## 2021-07-22 ENCOUNTER — Inpatient Hospital Stay: Payer: BC Managed Care – PPO | Attending: Family Medicine

## 2021-07-22 ENCOUNTER — Other Ambulatory Visit: Payer: Self-pay

## 2021-07-22 ENCOUNTER — Encounter (INDEPENDENT_AMBULATORY_CARE_PROVIDER_SITE_OTHER): Payer: Self-pay | Admitting: Ophthalmology

## 2021-07-22 ENCOUNTER — Ambulatory Visit (INDEPENDENT_AMBULATORY_CARE_PROVIDER_SITE_OTHER): Payer: Medicare Other | Admitting: Ophthalmology

## 2021-07-22 DIAGNOSIS — B0052 Herpesviral keratitis: Secondary | ICD-10-CM | POA: Diagnosis not present

## 2021-07-22 DIAGNOSIS — H209 Unspecified iridocyclitis: Secondary | ICD-10-CM

## 2021-07-22 DIAGNOSIS — H25813 Combined forms of age-related cataract, bilateral: Secondary | ICD-10-CM | POA: Diagnosis not present

## 2021-07-22 DIAGNOSIS — H43813 Vitreous degeneration, bilateral: Secondary | ICD-10-CM | POA: Diagnosis not present

## 2021-07-22 DIAGNOSIS — H35033 Hypertensive retinopathy, bilateral: Secondary | ICD-10-CM | POA: Diagnosis not present

## 2021-07-22 DIAGNOSIS — H34832 Tributary (branch) retinal vein occlusion, left eye, with macular edema: Secondary | ICD-10-CM

## 2021-07-22 DIAGNOSIS — H35412 Lattice degeneration of retina, left eye: Secondary | ICD-10-CM | POA: Diagnosis not present

## 2021-07-22 DIAGNOSIS — H1820 Unspecified corneal edema: Secondary | ICD-10-CM | POA: Diagnosis not present

## 2021-07-22 DIAGNOSIS — Z992 Dependence on renal dialysis: Secondary | ICD-10-CM | POA: Diagnosis not present

## 2021-07-22 DIAGNOSIS — H33332 Multiple defects of retina without detachment, left eye: Secondary | ICD-10-CM | POA: Diagnosis not present

## 2021-07-22 DIAGNOSIS — H40052 Ocular hypertension, left eye: Secondary | ICD-10-CM | POA: Diagnosis not present

## 2021-07-22 DIAGNOSIS — H34831 Tributary (branch) retinal vein occlusion, right eye, with macular edema: Secondary | ICD-10-CM

## 2021-07-22 DIAGNOSIS — N186 End stage renal disease: Secondary | ICD-10-CM | POA: Diagnosis not present

## 2021-07-22 DIAGNOSIS — N2581 Secondary hyperparathyroidism of renal origin: Secondary | ICD-10-CM | POA: Diagnosis not present

## 2021-07-22 DIAGNOSIS — I10 Essential (primary) hypertension: Secondary | ICD-10-CM | POA: Diagnosis not present

## 2021-07-22 DIAGNOSIS — H34833 Tributary (branch) retinal vein occlusion, bilateral, with macular edema: Secondary | ICD-10-CM

## 2021-07-22 MED ORDER — LOTEMAX SM 0.38 % OP GEL: 1.0000 [drp] | OPHTHALMIC | 3 refills | Status: DC

## 2021-07-22 MED ORDER — BRIMONIDINE TARTRATE 0.2 % OP SOLN: 1.0000 [drp] | Freq: Three times a day (TID) | OPHTHALMIC | 2 refills | Status: DC

## 2021-07-22 MED ORDER — DORZOLAMIDE HCL-TIMOLOL MAL 2-0.5 % OP SOLN: 1.0000 [drp] | Freq: Three times a day (TID) | OPHTHALMIC | 2 refills | Status: DC

## 2021-07-22 NOTE — Progress Notes (Deleted)
Hematology and Oncology Follow Up Visit ? ?Michael [name]. ?[id number] ?[birth year] 66 y.o. ?07/22/2021 10:31 AM ?Marcy Panning, MD  ? ?Principle Diagnosis:** ? ? ?Prior Therapy: ? ?Current therapy: ? ?Interim History:  *** ? ? does not report any headaches, blurry vision, syncope or seizures. Does not report any fevers, chills or sweats.  Does not report any cough, wheezing or hemoptysis.  Does not report any chest pain, palpitation, orthopnea or leg edema.  Does not report any nausea, vomiting or abdominal pain.  Does not report any constipation or diarrhea.  Does not report any skeletal complaints.    Does not report frequency, urgency or hematuria.  Does not report any skin rashes or lesions. Does not report any heat or cold intolerance.  Does not report any lymphadenopathy or petechiae.  Does not report any anxiety or depression.  Remaining review of systems is negative.  ? ? ?Medications: {medication reviewed/display:[id number]}  ?Current Outpatient Medications  ?Medication Sig Dispense Refill  ? acyclovir (ZOVIRAX) 400 MG tablet Take 400 mg by mouth 2 (two) times daily.    ? apixaban (ELIQUIS) 5 MG TABS tablet Take 1 tablet by mouth twice daily 180 tablet 1  ? brimonidine (ALPHAGAN) 0.2 % ophthalmic solution Place 1 drop into the left eye 3 (three) times daily. 10 mL 2  ? cinacalcet (SENSIPAR) 30 MG tablet Take 30 mg by mouth 3 (three) times a week.    ? clotrimazole-betamethasone (LOTRISONE) cream Apply 1 application topically 2 (two) times daily. (Patient not taking: Reported on 07/20/2021) 45 g 2  ? dorzolamide-timolol (COSOPT) 22.3-6.8 MG/ML ophthalmic solution Place 1 drop into the left eye 3 (three) times daily. 10 mL 2  ? losartan (COZAAR) 25 MG tablet Take 0.5 tablets (12.5 mg total) by mouth daily. 45 tablet 3  ? Loteprednol Etabonate (LOTEMAX SM) 0.38 % GEL Place 1 drop into the left eye every 2 (two) hours. 5 g 3  ? metoprolol succinate (TOPROL XL) 25 MG 24 hr tablet Take 1 tablet (25  mg total) by mouth at bedtime. 90 tablet 3  ? nitroGLYCERIN (NITROSTAT) 0.4 MG SL tablet Place 1 tablet (0.4 mg total) under the tongue every 5 (five) minutes x 3 doses as needed for chest pain. (Patient not taking: Reported on 04/22/2021) 30 tablet 12  ? rosuvastatin (CRESTOR) 10 MG tablet Take 1 tablet (10 mg total) by mouth daily. 90 tablet 3  ? sevelamer carbonate (RENVELA) 800 MG tablet Take 800 mg by mouth 3 (three) times daily with meals.    ? ?No current facility-administered medications for this visit.  ? ? ? ?Allergies:  ?Allergies  ?Allergen Reactions  ? Indapamide Other (See Comments)  ? ? ?Past Medical History, Surgical history, Social history, and Family History were reviewed and updated. ? ?Review of Systems: ? ?Remaining ROS negative. ? ?Physical Exam: ?There were no vitals taken for this visit. ?ECOG:  ?General appearance: alert and cooperative appeared without distress. ?Head: Normocephalic, without obvious abnormality ?Oropharynx: No oral thrush or ulcers. ?Eyes: No scleral icterus.  Pupils are equal and round reactive to light. ?Lymph nodes: Cervical, supraclavicular, and axillary nodes normal. ?Heart:regular rate and rhythm, S1, S2 normal, no murmur, click, rub or gallop ?Lung:chest clear, no wheezing, rales, normal symmetric air entry ?Abdomin: soft, non-tender, without masses or organomegaly. ?Neurological: No motor, sensory deficits.  Intact deep tendon reflexes. ?Skin: No rashes or lesions.  No ecchymosis or petechiae. ?Musculoskeletal: No joint deformity or effusion. ?Psychiatric: Mood and affect are appropriate. ? ? ? ?  Lab Results: ?Lab Results  ?Component Value Date  ? WBC 2.9 (L) 01/21/2021  ? HGB 15.6 01/21/2021  ? HCT 49.3 01/21/2021  ? MCV 94.6 01/21/2021  ? PLT 115 (L) 01/21/2021  ? ?  Chemistry   ?   ?Component Value Date/Time  ? NA 140 04/22/2021 1435  ? K 5.3 (H) 04/22/2021 1435  ? CL 96 04/22/2021 1435  ? CO2 26 04/22/2021 1435  ? BUN 23 04/22/2021 1435  ? CREATININE 7.22 (H)  04/22/2021 1435  ?    ?Component Value Date/Time  ? CALCIUM 9.6 04/22/2021 1435  ? ALKPHOS 32 (L) 08/19/2020 1739  ? AST 15 08/19/2020 1739  ? ALT 10 08/19/2020 1739  ? BILITOT 2.0 (H) 08/19/2020 1739  ?  ? ? ? ?Radiological Studies: ?OCT, Retina - OU - Both Eyes ? ?Result Date: 07/22/2021 ?Right Eye Quality was good. Central Foveal Thickness: 239. Progression has been stable. Findings include normal foveal contour, no SRF, no IRF, vitreomacular adhesion , inner retinal atrophy (Inner retinal atrophy IT quad caught on widefield - stable). Left Eye Findings include (No image obtained today). 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 CME - cystoid macular edema. PED - pigment epithelial detachment. IRF - intraretinal fluid. SRF - subretinal fluid. EZ - ellipsoid zone. ERM - epiretinal membrane. ORA - outer retinal atrophy. ORT - outer retinal tubulation. SRHM - subretinal hyper-reflective material  ? ? ?Impression and Plan: ?*** ? ?Spent more than half the time coordinating care.  ? ? ?Zola Button, MD ?3/23/202310:31 AM ? ?

## 2021-07-22 NOTE — Telephone Encounter (Addendum)
NO PA REQUIRED FOR SPLIT NIGHT ?Tracking Id# [id number] ?Member's Primary Insurance is listed as MEDICARE A. Please contact primary insurance. ? ?

## 2021-07-23 ENCOUNTER — Encounter (INDEPENDENT_AMBULATORY_CARE_PROVIDER_SITE_OTHER): Payer: Self-pay | Admitting: Ophthalmology

## 2021-07-23 ENCOUNTER — Telehealth: Payer: Self-pay | Admitting: Oncology

## 2021-07-23 ENCOUNTER — Ambulatory Visit (INDEPENDENT_AMBULATORY_CARE_PROVIDER_SITE_OTHER): Payer: Medicare Other | Admitting: Ophthalmology

## 2021-07-23 DIAGNOSIS — H34833 Tributary (branch) retinal vein occlusion, bilateral, with macular edema: Secondary | ICD-10-CM | POA: Diagnosis not present

## 2021-07-23 DIAGNOSIS — H25813 Combined forms of age-related cataract, bilateral: Secondary | ICD-10-CM | POA: Diagnosis not present

## 2021-07-23 DIAGNOSIS — H43813 Vitreous degeneration, bilateral: Secondary | ICD-10-CM

## 2021-07-23 DIAGNOSIS — H33332 Multiple defects of retina without detachment, left eye: Secondary | ICD-10-CM

## 2021-07-23 DIAGNOSIS — H1820 Unspecified corneal edema: Secondary | ICD-10-CM

## 2021-07-23 DIAGNOSIS — H34832 Tributary (branch) retinal vein occlusion, left eye, with macular edema: Secondary | ICD-10-CM

## 2021-07-23 DIAGNOSIS — H34831 Tributary (branch) retinal vein occlusion, right eye, with macular edema: Secondary | ICD-10-CM

## 2021-07-23 DIAGNOSIS — I10 Essential (primary) hypertension: Secondary | ICD-10-CM

## 2021-07-23 DIAGNOSIS — H40052 Ocular hypertension, left eye: Secondary | ICD-10-CM | POA: Diagnosis not present

## 2021-07-23 DIAGNOSIS — H35412 Lattice degeneration of retina, left eye: Secondary | ICD-10-CM | POA: Diagnosis not present

## 2021-07-23 DIAGNOSIS — H35033 Hypertensive retinopathy, bilateral: Secondary | ICD-10-CM | POA: Diagnosis not present

## 2021-07-23 DIAGNOSIS — B0052 Herpesviral keratitis: Secondary | ICD-10-CM | POA: Diagnosis not present

## 2021-07-23 DIAGNOSIS — H209 Unspecified iridocyclitis: Secondary | ICD-10-CM | POA: Diagnosis not present

## 2021-07-23 NOTE — Progress Notes (Signed)
?[facility] Note ? ?07/23/2021 ?  ? ?CHIEF COMPLAINT ?Patient presents for Retina Follow Up ? ? ?HISTORY OF PRESENT ILLNESS: ?Michael [name]. is a 66 y.o. male who presents to the clinic today for:  ? ?HPI   ? ? Retina Follow Up   ?Patient presents with  Other.  In left eye.  Severity is moderate.  Duration of 1 day.  Since onset it is stable.  I, the attending physician,  performed the HPI with the patient and updated documentation appropriately. ? ?  ?  ? ? Comments   ?Pt here for 1 day ret f/u for cornea check OS. Pt states VA in OS is getting better, not as sensitive as it was yesterday. Still a little blurry.  ? ?  ?  ?Last edited by [name], MD on 07/23/2021  9:29 PM.  ?  ?pt states eye is doing much better today, no longer painful or light sensitive ? ?Referring physician: ?[name], MD ?[address] ?[address] ?[place],  [place] [address] ? ?HISTORICAL INFORMATION:  ? ?Selected notes from the [facility] ?Referred by Dr. [name]. Hecker for concern of HRVO OD; ?LEE- 11.30.18 ([name]) {BCVA OD: 20/100-1 OS: 20/20-2] ?Ocular Hx- cataract OU ?PMH- HTN, high chol, kidney disease, sleep apnea, emphysema ?  ? ?CURRENT MEDICATIONS: ?Current Outpatient Medications (Ophthalmic Drugs)  ?Medication Sig  ? brimonidine (ALPHAGAN) 0.2 % ophthalmic solution Place 1 drop into the left eye 3 (three) times daily.  ? dorzolamide-timolol (COSOPT) 22.3-6.8 MG/ML ophthalmic solution Place 1 drop into the left eye 3 (three) times daily.  ? Loteprednol Etabonate (LOTEMAX SM) 0.38 % GEL Place 1 drop into the left eye every 2 (two) hours.  ? ?No current facility-administered medications for this visit. (Ophthalmic Drugs)  ? ?Current Outpatient Medications (Other)  ?Medication Sig  ? acyclovir (ZOVIRAX) 400 MG tablet Take 400 mg by mouth 2 (two) times daily.  ? apixaban (ELIQUIS) 5 MG TABS tablet Take 1 tablet by mouth twice daily  ? cinacalcet (SENSIPAR) 30 MG tablet Take 30 mg by  mouth 3 (three) times a week.  ? losartan (COZAAR) 25 MG tablet Take 0.5 tablets (12.5 mg total) by mouth daily.  ? metoprolol succinate (TOPROL XL) 25 MG 24 hr tablet Take 1 tablet (25 mg total) by mouth at bedtime.  ? rosuvastatin (CRESTOR) 10 MG tablet Take 1 tablet (10 mg total) by mouth daily.  ? sevelamer carbonate (RENVELA) 800 MG tablet Take 800 mg by mouth 3 (three) times daily with meals.  ? clotrimazole-betamethasone (LOTRISONE) cream Apply 1 application topically 2 (two) times daily. (Patient not taking: Reported on 07/20/2021)  ? nitroGLYCERIN (NITROSTAT) 0.4 MG SL tablet Place 1 tablet (0.4 mg total) under the tongue every 5 (five) minutes x 3 doses as needed for chest pain. (Patient not taking: Reported on 04/22/2021)  ? ?No current facility-administered medications for this visit. (Other)  ? ?REVIEW OF SYSTEMS: ?ROS   ?Positive for: Genitourinary, Cardiovascular, Eyes, Respiratory ?Negative for: Constitutional, Gastrointestinal, Neurological, Skin, Musculoskeletal, HENT, Endocrine, Psychiatric, Allergic/Imm, Heme/Lymph ?Last edited by Kingsley Spittle, [facility] on 07/23/2021  8:01 AM.  ?  ? ?ALLERGIES ?Allergies  ?Allergen Reactions  ? Indapamide Other (See Comments)  ? ?PAST MEDICAL HISTORY ?Past Medical History:  ?Diagnosis Date  ? Atrial arrhythmia   ? atrial tachycardia with variable AV conduction versus atypical aflutter 01/10/19, rate control (02/06/19)  ? Cataract   ? Dyspnea   ? on  exertion  ? ESRD (end stage renal disease) ([place])   ? Hemo- MWF, Polycystic kidney disease  ? Fatigue   ? History of kidney stones   ? removal of stone- cysto  ? Hyperlipidemia   ? Hyperparathyroidism, secondary renal ([facility])   ? Hypertension   ? Hypoxemia 12/12/2013  ? Nonischemic cardiomyopathy ([place])   ? Er 25% 2015, 55 % 2013  ? OSA on CPAP   ? no longer using cpap  ? OSA on CPAP 03/24/2014  ? Pneumonia   ? 2015ish  ? Prostate cancer ([place])   ? Ventricular tachycardia//Freq PVCs   ? Wears glasses   ? ?Past Surgical  History:  ?Procedure Laterality Date  ? A-FLUTTER ABLATION N/A 04/11/2019  ? Procedure: A-FLUTTER ABLATION;  Surgeon: Evans Lance, MD;  Location: [facility];  Service: Cardiovascular;  Laterality: N/A;  ? A/V FISTULAGRAM Left 04/27/2017  ? Procedure: A/V FISTULAGRAM;  Surgeon: Conrad [place], MD;  Location: [facility];  Service: Cardiovascular;  Laterality: Left;  lt arm  ? A/V FISTULAGRAM Left 01/10/2019  ? Procedure: A/V FISTULAGRAM;  Surgeon: Marty Heck, MD;  Location: [facility];  Service: Cardiovascular;  Laterality: Left;  ? APPENDECTOMY    ? AV FISTULA PLACEMENT  12/05/2011  ? Procedure: ARTERIOVENOUS (AV) FISTULA CREATION;LLEFT ARM  Surgeon: Conrad [place], MD;  Location: [facility];  Service: Vascular;  Laterality: Left;  RADIO-CEPHALIC  fistula left arm  ? AV FISTULA PLACEMENT  01/11/2012  ? Procedure: ARTERIOVENOUS (AV) FISTULA CREATION;  Surgeon: Conrad [place], MD;  Location: [facility];  Service: Vascular;  Laterality: Left;  Creation of left brachial cephalic arteriovenous fistula  ? AV FISTULA PLACEMENT Right 03/07/2019  ? Procedure: ARTERIOVENOUS (AV) FISTULA CREATION  RIGHT ARM;  Surgeon: Marty Heck, MD;  Location: [facility];  Service: Vascular;  Laterality: Right;  ? BASCILIC VEIN TRANSPOSITION Left 12/27/2016  ? Procedure: BASILIC VEIN TRANSPOSITION LEFT UPPER ARM FIRST STAGE;  Surgeon: Conrad Beaulieu, MD;  Location: [facility];  Service: Vascular;  Laterality: Left;  ? BASCILIC VEIN TRANSPOSITION Left 01/31/2017  ? Procedure: LEFT ARM BASILIC VEIN TRANSPOSITION, SECOND STAGE;  Surgeon: Conrad [place], MD;  Location: [facility];  Service: Vascular;  Laterality: Left;  ? CARDIAC CATHETERIZATION  04-05-2010  ? checking for blockage but none-[facility]  ? COLONOSCOPY    ? CYSTOSCOPY W/ STONE MANIPULATION    ? "laser once" (01/22/2013)  ? HEMATOMA EVACUATION Left 05/09/2017  ? Procedure: EVACUATION HEMATOMA LEFT ARM;  Surgeon: Conrad Sheridan, MD;  Location: [facility];  Service: Vascular;  Laterality:  Left;  ? HERNIA REPAIR    ? INGUINAL HERNIA REPAIR Right 11/06/2015  ? Procedure: OPEN HERNIA REPAIR  RIGHT INGUINAL ADULT;  Surgeon: [name], MD;  [facility];  Service: General;  Laterality: Right;  with MESH  ? INSERTION OF DIALYSIS CATHETER Right 10/05/2016  ? Procedure: INSERTION OF right internal jugular DIALYSIS CATHETER;  Surgeon: [name], MD;  Location: [facility];  Service: Vascular;  Laterality: Right;  ? INSERTION OF MESH  03/20/2012  ? Procedure: INSERTION OF MESH;  UMB Surgeon: [name], MD;  Location: [facility];  Service: General;  Laterality: N/A;  ? INSERTION OF MESH N/A 01/22/2013  ? Procedure: INSERTION OF MESH;  Surgeon: [name], MD;  Location: [facility];  Service: General;  Laterality: N/A;  ? LAPAROTOMY  04/02/2012  ? Procedure: EXPLORATORY LAPAROTOMY;  Surgeon: [name], MD;  Location: [facility]  OR;  Service: General;  Laterality: N/A;  Exploratory Laparotomy with resection of small intestine  ? LEFT HEART CATHETERIZATION WITH CORONARY ANGIOGRAM N/A 05/14/2013  ? Procedure: LEFT HEART CATHETERIZATION WITH CORONARY ANGIOGRAM;  Surgeon: Sinclair Grooms, MD;  Location: [facility] CATH LAB;  Service: Cardiovascular;  Laterality: N/A;  ? LIGATION OF ARTERIOVENOUS  FISTULA Left 12/27/2016  ? Procedure: LIGATION/EXCISION OF LEFT UPPER ARM ARTERIOVENOUS  FISTULA;  EVACUATION OF HEMATOMA;  Surgeon: Conrad [place]co, MD;  Location: [facility];  Service: Vascular;  Laterality: Left;  ? LIGATION OF ARTERIOVENOUS  FISTULA Left 03/07/2019  ? Procedure: LIGATION OF ARTERIOVENOUS FISTULA  LEFT UPPER ARM;  Surgeon: Marty Heck, MD;  Location: [facility];  Service: Vascular;  Laterality: Left;  ? REVISON OF ARTERIOVENOUS FISTULA Left 10/05/2016  ? Procedure: REVISON OF left arm ARTERIOVENOUS FISTULA;  Surgeon: [name], MD;  Location: [facility] OR;  Service: Vascular;  Laterality: Left;  ? TONSILLECTOMY    ? UMBILICAL HERNIA REPAIR  03/20/2012  ? Procedure: HERNIA REPAIR UMBILICAL ADULT;  Surgeon: [name], MD;  Location: [facility];  Service: General;  Laterality: N/A;  ? UMBILICAL HERNIA REPAIR  01/22/2013  ? preperitoneal open procedure due to significant adhesions/notes 01/22/2013  ? VENTRAL HERNIA REPAIR N/A 9/

## 2021-07-23 NOTE — Telephone Encounter (Signed)
.  Called patient to schedule appointment per 3/23 inbasket, patient is aware of date and time.   ?

## 2021-07-24 DIAGNOSIS — N2581 Secondary hyperparathyroidism of renal origin: Secondary | ICD-10-CM | POA: Diagnosis not present

## 2021-07-24 DIAGNOSIS — N186 End stage renal disease: Secondary | ICD-10-CM | POA: Diagnosis not present

## 2021-07-24 DIAGNOSIS — Z992 Dependence on renal dialysis: Secondary | ICD-10-CM | POA: Diagnosis not present

## 2021-07-26 ENCOUNTER — Ambulatory Visit (INDEPENDENT_AMBULATORY_CARE_PROVIDER_SITE_OTHER): Payer: Medicare Other | Admitting: Ophthalmology

## 2021-07-26 ENCOUNTER — Encounter (INDEPENDENT_AMBULATORY_CARE_PROVIDER_SITE_OTHER): Payer: Self-pay | Admitting: Ophthalmology

## 2021-07-26 ENCOUNTER — Other Ambulatory Visit: Payer: Self-pay

## 2021-07-26 DIAGNOSIS — H34831 Tributary (branch) retinal vein occlusion, right eye, with macular edema: Secondary | ICD-10-CM | POA: Diagnosis not present

## 2021-07-26 DIAGNOSIS — H209 Unspecified iridocyclitis: Secondary | ICD-10-CM

## 2021-07-26 DIAGNOSIS — H43813 Vitreous degeneration, bilateral: Secondary | ICD-10-CM | POA: Diagnosis not present

## 2021-07-26 DIAGNOSIS — H40052 Ocular hypertension, left eye: Secondary | ICD-10-CM | POA: Diagnosis not present

## 2021-07-26 DIAGNOSIS — I10 Essential (primary) hypertension: Secondary | ICD-10-CM

## 2021-07-26 DIAGNOSIS — H25813 Combined forms of age-related cataract, bilateral: Secondary | ICD-10-CM | POA: Diagnosis not present

## 2021-07-26 DIAGNOSIS — B0052 Herpesviral keratitis: Secondary | ICD-10-CM | POA: Diagnosis not present

## 2021-07-26 DIAGNOSIS — H33332 Multiple defects of retina without detachment, left eye: Secondary | ICD-10-CM | POA: Diagnosis not present

## 2021-07-26 DIAGNOSIS — H35412 Lattice degeneration of retina, left eye: Secondary | ICD-10-CM

## 2021-07-26 DIAGNOSIS — H34832 Tributary (branch) retinal vein occlusion, left eye, with macular edema: Secondary | ICD-10-CM | POA: Diagnosis not present

## 2021-07-26 DIAGNOSIS — H35033 Hypertensive retinopathy, bilateral: Secondary | ICD-10-CM | POA: Diagnosis not present

## 2021-07-26 MED ORDER — MURO 128 2 % OP SOLN: 1.0000 [drp] | Freq: Four times a day (QID) | OPHTHALMIC | 2 refills | Status: DC

## 2021-07-26 MED ORDER — LOTEMAX SM 0.38 % OP GEL: 1.0000 [drp] | OPHTHALMIC | 3 refills | Status: DC

## 2021-07-26 NOTE — Progress Notes (Signed)
?[facility] Note ? ?07/26/2021 ?  ? ?CHIEF COMPLAINT ?Patient presents for Retina Follow Up ? ? ?HISTORY OF PRESENT ILLNESS: ?Michael [name]. is a 66 y.o. male who presents to the clinic today for:  ? ?HPI   ? ? Retina Follow Up   ?Patient presents with  Other.  In left eye.  Severity is moderate.  Duration of 3 days.  Since onset it is gradually improving.  I, the attending physician,  performed the HPI with the patient and updated documentation appropriately. ? ?  ?  ? ? Comments   ?Pt here for 3 day f/u corneal abrasion OS. Pt states OS is slowly clearing up.  ? ?  ?  ?Last edited by [name], MD on 07/26/2021  8:44 AM.  ?  ?pt states the pharmacy did not give him a pink top drop, he ran out of the sample that was given to him this morning ? ?Referring physician: ?Michael Dus, MD ?[address] ?[address] ?[place],  [place] [address] ? ?HISTORICAL INFORMATION:  ? ?Selected notes from the Michael [name] ?Referred by Dr. Raliegh [name]. Hecker for concern of HRVO OD; ?LEE- 11.30.18 ([name]) {BCVA OD: 20/100-1 OS: 20/20-2] ?Ocular Hx- cataract OU ?PMH- HTN, high chol, kidney disease, sleep apnea, emphysema ?  ? ?CURRENT MEDICATIONS: ?Current Outpatient Medications (Ophthalmic Drugs)  ?Medication Sig  ? brimonidine (ALPHAGAN) 0.2 % ophthalmic solution Place 1 drop into the left eye 3 (three) times daily.  ? dorzolamide-timolol (COSOPT) 22.3-6.8 MG/ML ophthalmic solution Place 1 drop into the left eye 3 (three) times daily.  ? sodium chloride (MURO 128) 2 % ophthalmic solution Place 1 drop into the left eye 4 (four) times daily.  ? Loteprednol Etabonate (LOTEMAX SM) 0.38 % GEL Place 1 drop into the left eye every 2 (two) hours.  ? ?No current facility-administered medications for this visit. (Ophthalmic Drugs)  ? ?Current Outpatient Medications (Other)  ?Medication Sig  ? acyclovir (ZOVIRAX) 400 MG tablet Take 400 mg by mouth 2 (two) times daily.  ? apixaban (ELIQUIS) 5 MG TABS tablet  Take 1 tablet by mouth twice daily  ? cinacalcet (SENSIPAR) 30 MG tablet Take 30 mg by mouth 3 (three) times a week.  ? losartan (COZAAR) 25 MG tablet Take 0.5 tablets (12.5 mg total) by mouth daily.  ? metoprolol succinate (TOPROL XL) 25 MG 24 hr tablet Take 1 tablet (25 mg total) by mouth at bedtime.  ? nitroGLYCERIN (NITROSTAT) 0.4 MG SL tablet Place 1 tablet (0.4 mg total) under the tongue every 5 (five) minutes x 3 doses as needed for chest pain.  ? rosuvastatin (CRESTOR) 10 MG tablet Take 1 tablet (10 mg total) by mouth daily.  ? sevelamer carbonate (RENVELA) 800 MG tablet Take 800 mg by mouth 3 (three) times daily with meals.  ? clotrimazole-betamethasone (LOTRISONE) cream Apply 1 application topically 2 (two) times daily. (Patient not taking: Reported on 07/26/2021)  ? ?No current facility-administered medications for this visit. (Other)  ? ?REVIEW OF SYSTEMS: ?ROS   ?Positive for: Genitourinary, Cardiovascular, Eyes, Respiratory ?Negative for: Constitutional, Gastrointestinal, Neurological, Skin, Musculoskeletal, HENT, Endocrine, Psychiatric, Allergic/Imm, Heme/Lymph ?Last edited by Michael [name], COT on 07/26/2021  8:27 AM.  ?  ? ?ALLERGIES ?Allergies  ?Allergen Reactions  ? Indapamide Other (See Comments)  ? ?PAST MEDICAL HISTORY ?Past Medical History:  ?Diagnosis Date  ? Atrial arrhythmia   ? atrial tachycardia with variable AV conduction versus atypical aflutter 01/10/19, rate  control (02/06/19)  ? Cataract   ? Dyspnea   ? on exertion  ? ESRD (end stage renal disease) ([place])   ? Hemo- MWF, Polycystic kidney disease  ? Fatigue   ? History of kidney stones   ? removal of stone- cysto  ? Hyperlipidemia   ? Hyperparathyroidism, secondary renal ([place])   ? Hypertension   ? Hypoxemia 12/12/2013  ? Nonischemic cardiomyopathy ([place])   ? Er 25% 2015, 55 % 2013  ? OSA on CPAP   ? no longer using cpap  ? OSA on CPAP 03/24/2014  ? Pneumonia   ? 2015ish  ? Prostate cancer ([place])   ? Ventricular tachycardia//Freq PVCs    ? Wears glasses   ? ?Past Surgical History:  ?Procedure Laterality Date  ? A-FLUTTER ABLATION N/A 04/11/2019  ? Procedure: A-FLUTTER ABLATION;  Surgeon: Evans Lance, MD;  Location: [facility];  Service: Cardiovascular;  Laterality: N/A;  ? A/V FISTULAGRAM Left 04/27/2017  ? Procedure: A/V FISTULAGRAM;  Surgeon: Conrad Tuscola, MD;  Location: [facility];  Service: Cardiovascular;  Laterality: Left;  lt arm  ? A/V FISTULAGRAM Left 01/10/2019  ? Procedure: A/V FISTULAGRAM;  Surgeon: Marty Heck, MD;  Location: [facility];  Service: Cardiovascular;  Laterality: Left;  ? APPENDECTOMY    ? AV FISTULA PLACEMENT  12/05/2011  ? Procedure: ARTERIOVENOUS (AV) FISTULA CREATION;LLEFT ARM  Surgeon: Conrad [place], MD;  Location: [facility];  Service: Vascular;  Laterality: Left;  RADIO-CEPHALIC  fistula left arm  ? AV FISTULA PLACEMENT  01/11/2012  ? Procedure: ARTERIOVENOUS (AV) FISTULA CREATION;  Surgeon: Conrad Clint, MD;  Location: [facility];  Service: Vascular;  Laterality: Left;  Creation of left brachial cephalic arteriovenous fistula  ? AV FISTULA PLACEMENT Right 03/07/2019  ? Procedure: ARTERIOVENOUS (AV) FISTULA CREATION  RIGHT ARM;  Surgeon: Marty Heck, MD;  Location: [facility];  Service: Vascular;  Laterality: Right;  ? BASCILIC VEIN TRANSPOSITION Left 12/27/2016  ? Procedure: BASILIC VEIN TRANSPOSITION LEFT UPPER ARM FIRST STAGE;  Surgeon: Conrad Bridgewater, MD;  Location: [facility];  Service: Vascular;  Laterality: Left;  ? BASCILIC VEIN TRANSPOSITION Left 01/31/2017  ? Procedure: LEFT ARM BASILIC VEIN TRANSPOSITION, SECOND STAGE;  Surgeon: Conrad Marlin, MD;  Location: [facility];  Service: Vascular;  Laterality: Left;  ? CARDIAC CATHETERIZATION  04-05-2010  ? checking for blockage but none-[facility]  ? COLONOSCOPY    ? CYSTOSCOPY W/ STONE MANIPULATION    ? "laser once" (01/22/2013)  ? HEMATOMA EVACUATION Left 05/09/2017  ? Procedure: EVACUATION HEMATOMA LEFT ARM;  Surgeon: Conrad Sabinal, MD;  Location: [facility];   Service: Vascular;  Laterality: Left;  ? HERNIA REPAIR    ? INGUINAL HERNIA REPAIR Right 11/06/2015  ? Procedure: OPEN HERNIA REPAIR  RIGHT INGUINAL ADULT;  Surgeon: [name], MD;  [facility];  Service: General;  Laterality: Right;  with MESH  ? INSERTION OF DIALYSIS CATHETER Right 10/05/2016  ? Procedure: INSERTION OF right internal jugular DIALYSIS CATHETER;  Surgeon: [name], MD;  Location: [facility];  Service: Vascular;  Laterality: Right;  ? INSERTION OF MESH  03/20/2012  ? Procedure: INSERTION OF MESH;  UMB Surgeon: [name], MD;  Location: [facility];  Service: General;  Laterality: N/A;  ? INSERTION OF MESH N/A 01/22/2013  ? Procedure: INSERTION OF MESH;  Surgeon: [name], MD;  Location: [facility];  Service: General;  Laterality: N/A;  ? LAPAROTOMY  04/02/2012  ?  Procedure: EXPLORATORY LAPAROTOMY;  Surgeon: [name], MD;  Location: [facility];  Service: General;  Laterality: N/A;  Exploratory Laparotomy with resection of small intestine  ? LEFT HEART CATHETERIZATION WITH CORONARY ANGIOGRAM N/A 05/14/2013  ? Procedure: LEFT HEART CATHETERIZATION WITH CORONARY ANGIOGRAM;  Surgeon: Sinclair Grooms, MD;  Location: [facility];  Service: Cardiovascular;  Laterality: N/A;  ? LIGATION OF ARTERIOVENOUS  FISTULA Left 12/27/2016  ? Procedure: LIGATION/EXCISION OF LEFT UPPER ARM ARTERIOVENOUS  FISTULA;  EVACUATION OF HEMATOMA;  Surgeon: Conrad O'Fallon, MD;  Location: [facility];  Service: Vascular;  Laterality: Left;  ? LIGATION OF ARTERIOVENOUS  FISTULA Left 03/07/2019  ? Procedure: LIGATION OF ARTERIOVENOUS FISTULA  LEFT UPPER ARM;  Surgeon: Marty Heck, MD;  Location: [facility];  Service: Vascular;  Laterality: Left;  ? REVISON OF ARTERIOVENOUS FISTULA Left 10/05/2016  ? Procedure: REVISON OF left arm ARTERIOVENOUS FISTULA;  Surgeon: [name], MD;  Location[facility];  Service: Vascular;  Laterality: Left;  ? TONSILLECTOMY    ? UMBILICAL HERNIA REPAIR  03/20/2012  ? Procedure: HERNIA REPAIR  UMBILICAL ADULT;  Surgeon: [name], MD;  Location: [facility];  Service: General;  Laterality: N/A;  ? UMBILICAL HERNIA REPAIR  01/22/2013  ? preperitoneal open procedure due to significant adhesions/

## 2021-07-27 DIAGNOSIS — N2581 Secondary hyperparathyroidism of renal origin: Secondary | ICD-10-CM | POA: Diagnosis not present

## 2021-07-27 DIAGNOSIS — Z992 Dependence on renal dialysis: Secondary | ICD-10-CM | POA: Diagnosis not present

## 2021-07-27 DIAGNOSIS — N186 End stage renal disease: Secondary | ICD-10-CM | POA: Diagnosis not present

## 2021-07-28 NOTE — Progress Notes (Signed)
?[facility] Note ? ?07/29/2021 ?  ? ?CHIEF COMPLAINT ?Patient presents for Retina Follow Up ? ? ?HISTORY OF PRESENT ILLNESS: ?Michael [name]. is a 66 y.o. male who presents to the clinic today for:  ? ?HPI   ? ? Retina Follow Up   ?Patient presents with  Other.  In left eye.  Severity is moderate.  Duration of 3 days.  Since onset it is gradually improving.  I, the attending physician,  performed the HPI with the patient and updated documentation appropriately. ? ?  ?  ? ? Comments   ?Pt here for 3 day f/u for IOP and K check OS. Pt states VA is slowly getting cleared up.  ? ?  ?  ?Last edited by [name], MD on 07/29/2021 11:55 AM.  ?  ?pt states he feels like his left eye is getting better, he states he has been using drops as directed ? ?Referring physician: ?Lisabeth Pick, MD ?[address] ?[place],  [place] [id number] ? ?HISTORICAL INFORMATION:  ? ?Selected notes from the [facility] ?Referred by Dr. [name]. Hecker for concern of HRVO OD; ?LEE- 11.30.18 ([name]) {BCVA OD: 20/100-1 OS: 20/20-2] ?Ocular Hx- cataract OU ?PMH- HTN, high chol, kidney disease, sleep apnea, emphysema ?  ? ?CURRENT MEDICATIONS: ?Current Outpatient Medications (Ophthalmic Drugs)  ?Medication Sig  ? brimonidine (ALPHAGAN) 0.2 % ophthalmic solution Place 1 drop into the left eye 3 (three) times daily.  ? dorzolamide-timolol (COSOPT) 22.3-6.8 MG/ML ophthalmic solution Place 1 drop into the left eye 3 (three) times daily.  ? Loteprednol Etabonate (LOTEMAX SM) 0.38 % GEL Place 1 drop into the left eye every 2 (two) hours.  ? sodium chloride (MURO 128) 2 % ophthalmic solution Place 1 drop into the left eye 4 (four) times daily.  ? ?No current facility-administered medications for this visit. (Ophthalmic Drugs)  ? ?Current Outpatient Medications (Other)  ?Medication Sig  ? acyclovir (ZOVIRAX) 400 MG tablet Take 400 mg by mouth 2 (two) times daily.  ? apixaban (ELIQUIS) 5 MG TABS tablet Take 1  tablet by mouth twice daily  ? cinacalcet (SENSIPAR) 30 MG tablet Take 30 mg by mouth 3 (three) times a week.  ? losartan (COZAAR) 25 MG tablet Take 0.5 tablets (12.5 mg total) by mouth daily.  ? metoprolol succinate (TOPROL XL) 25 MG 24 hr tablet Take 1 tablet (25 mg total) by mouth at bedtime.  ? nitroGLYCERIN (NITROSTAT) 0.4 MG SL tablet Place 1 tablet (0.4 mg total) under the tongue every 5 (five) minutes x 3 doses as needed for chest pain.  ? rosuvastatin (CRESTOR) 10 MG tablet Take 1 tablet (10 mg total) by mouth daily.  ? sevelamer carbonate (RENVELA) 800 MG tablet Take 800 mg by mouth 3 (three) times daily with meals.  ? clotrimazole-betamethasone (LOTRISONE) cream Apply 1 application topically 2 (two) times daily. (Patient not taking: Reported on 07/29/2021)  ? ?No current facility-administered medications for this visit. (Other)  ? ?REVIEW OF SYSTEMS: ?ROS   ?Positive for: Genitourinary, Cardiovascular, Eyes, Respiratory ?Negative for: Constitutional, Gastrointestinal, Neurological, Skin, Musculoskeletal, HENT, Endocrine, Psychiatric, Allergic/Imm, Heme/Lymph ?Last edited by Kingsley Spittle, COT on 07/29/2021  9:17 AM.  ?  ? ?ALLERGIES ?Allergies  ?Allergen Reactions  ? Indapamide Other (See Comments)  ? ?PAST MEDICAL HISTORY ?Past Medical History:  ?Diagnosis Date  ? Atrial arrhythmia   ? atrial tachycardia with variable AV conduction versus atypical aflutter 01/10/19, rate control (02/06/19)  ?  Cataract   ? Dyspnea   ? on exertion  ? ESRD (end stage renal disease) ([place])   ? Hemo- MWF, Polycystic kidney disease  ? Fatigue   ? History of kidney stones   ? removal of stone- cysto  ? Hyperlipidemia   ? Hyperparathyroidism, secondary renal ([place])   ? Hypertension   ? Hypoxemia 12/12/2013  ? Nonischemic cardiomyopathy ([place])   ? Er 25% 2015, 55 % 2013  ? OSA on CPAP   ? no longer using cpap  ? OSA on CPAP 03/24/2014  ? Pneumonia   ? 2015ish  ? Prostate cancer ([place])   ? Ventricular tachycardia//Freq PVCs   ? Wears  glasses   ? ?Past Surgical History:  ?Procedure Laterality Date  ? A-FLUTTER ABLATION N/A 04/11/2019  ? Procedure: A-FLUTTER ABLATION;  Surgeon: Evans Lance, MD;  Location: [facility];  Service: Cardiovascular;  Laterality: N/A;  ? A/V FISTULAGRAM Left 04/27/2017  ? Procedure: A/V FISTULAGRAM;  Surgeon: Conrad [place], MD;  Location: [facility];  Service: Cardiovascular;  Laterality: Left;  lt arm  ? A/V FISTULAGRAM Left 01/10/2019  ? Procedure: A/V FISTULAGRAM;  Surgeon: Marty Heck, MD;  Location: [facility];  Service: Cardiovascular;  Laterality: Left;  ? APPENDECTOMY    ? AV FISTULA PLACEMENT  12/05/2011  ? Procedure: ARTERIOVENOUS (AV) FISTULA CREATION;LLEFT ARM  Surgeon: Conrad Gibbon, MD;  Location: [facility];  Service: Vascular;  Laterality: Left;  RADIO-CEPHALIC  fistula left arm  ? AV FISTULA PLACEMENT  01/11/2012  ? Procedure: ARTERIOVENOUS (AV) FISTULA CREATION;  Surgeon: Conrad [place], MD;  Location: [facility];  Service: Vascular;  Laterality: Left;  Creation of left brachial cephalic arteriovenous fistula  ? AV FISTULA PLACEMENT Right 03/07/2019  ? Procedure: ARTERIOVENOUS (AV) FISTULA CREATION  RIGHT ARM;  Surgeon: Marty Heck, MD;  Location: [facility];  Service: Vascular;  Laterality: Right;  ? BASCILIC VEIN TRANSPOSITION Left 12/27/2016  ? Procedure: BASILIC VEIN TRANSPOSITION LEFT UPPER ARM FIRST STAGE;  Surgeon: Conrad [place], MD;  Location: [facility];  Service: Vascular;  Laterality: Left;  ? BASCILIC VEIN TRANSPOSITION Left 01/31/2017  ? Procedure: LEFT ARM BASILIC VEIN TRANSPOSITION, SECOND STAGE;  Surgeon: Conrad [place], MD;  Location: [facility];  Service: Vascular;  Laterality: Left;  ? CARDIAC CATHETERIZATION  04-05-2010  ? checking for blockage but none-[facility]  ? COLONOSCOPY    ? CYSTOSCOPY W/ STONE MANIPULATION    ? "laser once" (01/22/2013)  ? HEMATOMA EVACUATION Left 05/09/2017  ? Procedure: EVACUATION HEMATOMA LEFT ARM;  Surgeon: Conrad , MD;  Location: [facility];  Service:  Vascular;  Laterality: Left;  ? HERNIA REPAIR    ? INGUINAL HERNIA REPAIR Right 11/06/2015  ? Procedure: OPEN HERNIA REPAIR  RIGHT INGUINAL ADULT;  Surgeon: [name], MD;  [facility];  Service: General;  Laterality: Right;  with MESH  ? INSERTION OF DIALYSIS CATHETER Right 10/05/2016  ? Procedure: INSERTION OF right internal jugular DIALYSIS CATHETER;  Surgeon: [name], MD;  Location: [facility];  Service: Vascular;  Laterality: Right;  ? INSERTION OF MESH  03/20/2012  ? Procedure: INSERTION OF MESH;  UMB Surgeon: [name], MD;  Location: [facility];  Service: General;  Laterality: N/A;  ? INSERTION OF MESH N/A 01/22/2013  ? Procedure: INSERTION OF MESH;  Surgeon: [name], MD;  Location: [facility];  Service: General;  Laterality: N/A;  ? LAPAROTOMY  04/02/2012  ? Procedure: EXPLORATORY  LAPAROTOMY;  Surgeon: [name], MD;  Location: [facility];  Service: General;  Laterality: N/A;  Exploratory Laparotomy with resection of small intestine  ? LEFT HEART CATHETERIZATION WITH CORONARY ANGIOGRAM N/A 05/14/2013  ? Procedure: LEFT HEART CATHETERIZATION WITH CORONARY ANGIOGRAM;  Surgeon: Sinclair Grooms, MD;  Location: [facility] LAB;  Service: Cardiovascular;  Laterality: N/A;  ? LIGATION OF ARTERIOVENOUS  FISTULA Left 12/27/2016  ? Procedure: LIGATION/EXCISION OF LEFT UPPER ARM ARTERIOVENOUS  FISTULA;  EVACUATION OF HEMATOMA;  Surgeon: Conrad Kenton [place], MD;  Location: [facility];  Service: Vascular;  Laterality: Left;  ? LIGATION OF ARTERIOVENOUS  FISTULA Left 03/07/2019  ? Procedure: LIGATION OF ARTERIOVENOUS FISTULA  LEFT UPPER ARM;  Surgeon: Marty Heck, MD;  Location: [facility];  Service: Vascular;  Laterality: Left;  ? REVISON OF ARTERIOVENOUS FISTULA Left 10/05/2016  ? Procedure: REVISON OF left arm ARTERIOVENOUS FISTULA;  Surgeon: [name], MD;  Location: [facility] OR;  Service: Vascular;  Laterality: Left;  ? TONSILLECTOMY    ? UMBILICAL HERNIA REPAIR  03/20/2012  ? Procedure: HERNIA REPAIR UMBILICAL  ADULT;  Surgeon: [name], MD;  Location: [facility];  Service: General;  Laterality: N/A;  ? UMBILICAL HERNIA REPAIR  01/22/2013  ? preperitoneal open procedure due to significant adhesions/notes 01/23/19

## 2021-07-29 ENCOUNTER — Encounter (INDEPENDENT_AMBULATORY_CARE_PROVIDER_SITE_OTHER): Payer: Self-pay | Admitting: Ophthalmology

## 2021-07-29 ENCOUNTER — Ambulatory Visit (INDEPENDENT_AMBULATORY_CARE_PROVIDER_SITE_OTHER): Payer: Medicare Other | Admitting: Ophthalmology

## 2021-07-29 DIAGNOSIS — H25813 Combined forms of age-related cataract, bilateral: Secondary | ICD-10-CM

## 2021-07-29 DIAGNOSIS — N186 End stage renal disease: Secondary | ICD-10-CM | POA: Diagnosis not present

## 2021-07-29 DIAGNOSIS — H35033 Hypertensive retinopathy, bilateral: Secondary | ICD-10-CM | POA: Diagnosis not present

## 2021-07-29 DIAGNOSIS — I10 Essential (primary) hypertension: Secondary | ICD-10-CM

## 2021-07-29 DIAGNOSIS — H34832 Tributary (branch) retinal vein occlusion, left eye, with macular edema: Secondary | ICD-10-CM

## 2021-07-29 DIAGNOSIS — H1820 Unspecified corneal edema: Secondary | ICD-10-CM

## 2021-07-29 DIAGNOSIS — H35412 Lattice degeneration of retina, left eye: Secondary | ICD-10-CM

## 2021-07-29 DIAGNOSIS — H33332 Multiple defects of retina without detachment, left eye: Secondary | ICD-10-CM | POA: Diagnosis not present

## 2021-07-29 DIAGNOSIS — H43813 Vitreous degeneration, bilateral: Secondary | ICD-10-CM

## 2021-07-29 DIAGNOSIS — H4052X2 Glaucoma secondary to other eye disorders, left eye, moderate stage: Secondary | ICD-10-CM

## 2021-07-29 DIAGNOSIS — H209 Unspecified iridocyclitis: Secondary | ICD-10-CM

## 2021-07-29 DIAGNOSIS — H34812 Central retinal vein occlusion, left eye, with macular edema: Secondary | ICD-10-CM | POA: Diagnosis not present

## 2021-07-29 DIAGNOSIS — N2581 Secondary hyperparathyroidism of renal origin: Secondary | ICD-10-CM | POA: Diagnosis not present

## 2021-07-29 DIAGNOSIS — B0052 Herpesviral keratitis: Secondary | ICD-10-CM | POA: Diagnosis not present

## 2021-07-29 DIAGNOSIS — H34831 Tributary (branch) retinal vein occlusion, right eye, with macular edema: Secondary | ICD-10-CM | POA: Diagnosis not present

## 2021-07-29 DIAGNOSIS — Z992 Dependence on renal dialysis: Secondary | ICD-10-CM | POA: Diagnosis not present

## 2021-07-29 DIAGNOSIS — H40052 Ocular hypertension, left eye: Secondary | ICD-10-CM

## 2021-07-29 MED ORDER — BEVACIZUMAB CHEMO INJECTION 1.25MG/0.05ML SYRINGE FOR KALEIDOSCOPE
1.2500 mg | INTRAVITREAL | Status: AC | PRN
  Administered 2021-07-29: 1.25 mg via INTRAVITREAL

## 2021-07-30 ENCOUNTER — Encounter (INDEPENDENT_AMBULATORY_CARE_PROVIDER_SITE_OTHER): Payer: Self-pay | Admitting: Ophthalmology

## 2021-07-30 ENCOUNTER — Ambulatory Visit (INDEPENDENT_AMBULATORY_CARE_PROVIDER_SITE_OTHER): Payer: Medicare Other | Admitting: Ophthalmology

## 2021-07-30 DIAGNOSIS — H34812 Central retinal vein occlusion, left eye, with macular edema: Secondary | ICD-10-CM

## 2021-07-30 DIAGNOSIS — H1820 Unspecified corneal edema: Secondary | ICD-10-CM

## 2021-07-30 DIAGNOSIS — H34831 Tributary (branch) retinal vein occlusion, right eye, with macular edema: Secondary | ICD-10-CM

## 2021-07-30 DIAGNOSIS — H209 Unspecified iridocyclitis: Secondary | ICD-10-CM

## 2021-07-30 DIAGNOSIS — B0052 Herpesviral keratitis: Secondary | ICD-10-CM | POA: Diagnosis not present

## 2021-07-30 DIAGNOSIS — H25813 Combined forms of age-related cataract, bilateral: Secondary | ICD-10-CM | POA: Diagnosis not present

## 2021-07-30 DIAGNOSIS — I10 Essential (primary) hypertension: Secondary | ICD-10-CM

## 2021-07-30 DIAGNOSIS — H4052X2 Glaucoma secondary to other eye disorders, left eye, moderate stage: Secondary | ICD-10-CM

## 2021-07-30 DIAGNOSIS — H33332 Multiple defects of retina without detachment, left eye: Secondary | ICD-10-CM

## 2021-07-30 DIAGNOSIS — H35412 Lattice degeneration of retina, left eye: Secondary | ICD-10-CM | POA: Diagnosis not present

## 2021-07-30 DIAGNOSIS — H43813 Vitreous degeneration, bilateral: Secondary | ICD-10-CM | POA: Diagnosis not present

## 2021-07-30 DIAGNOSIS — H35033 Hypertensive retinopathy, bilateral: Secondary | ICD-10-CM | POA: Diagnosis not present

## 2021-07-30 NOTE — Progress Notes (Signed)
?[facility] Note ? ?07/30/2021 ?  ? ?CHIEF COMPLAINT ?Patient presents for Retina Follow Up ? ? ?HISTORY OF PRESENT ILLNESS: ?Michael Schlarb. is a 66 y.o. male who presents to the clinic today for:  ? ?HPI   ? ? Retina Follow Up   ?Patient presents with  CRVO/BRVO (Elevated IOP OS).  In left eye.  Duration of 1 day.  Since onset it is stable.  I, the attending physician,  performed the HPI with the patient and updated documentation appropriately. ? ?  ?  ? ? Comments   ?Patient states taking all drops as instructed by Dr. Coralyn Pear. On vyzulta qhs OS in addition to all other drops listed on medication list. No eye pain per patient. Vision seems the same OU. ? ?  ?  ?Last edited by [name], MD on 08/02/2021  9:58 PM.  ?  ?pt states he feels like his left eye is getting better, he states he has been using drops as directed ? ?Referring physician: ?[name], MD ?[address] ?[address] ?[place],  [place] [address] ? ?HISTORICAL INFORMATION:  ? ?Selected notes from the [facility] ?Referred by Dr. [name]. Hecker for concern of HRVO OD; ?LEE- 11.30.18 ([name]) {BCVA OD: 20/100-1 OS: 20/20-2] ?Ocular Hx- cataract OU ?PMH- HTN, high chol, kidney disease, sleep apnea, emphysema ?  ? ?CURRENT MEDICATIONS: ?Current Outpatient Medications (Ophthalmic Drugs)  ?Medication Sig  ? atropine 1 % ophthalmic solution Place 1 drop into the left eye in the morning and at bedtime.  ? brimonidine (ALPHAGAN) 0.2 % ophthalmic solution Place 1 drop into the left eye 3 (three) times daily.  ? dorzolamide-timolol (COSOPT) 22.3-6.8 MG/ML ophthalmic solution Place 1 drop into the left eye 3 (three) times daily.  ? Loteprednol Etabonate (LOTEMAX SM) 0.38 % GEL Place 1 drop into the left eye every 2 (two) hours.  ? sodium chloride (MURO 128) 2 % ophthalmic solution Place 1 drop into the left eye 4 (four) times daily.  ? ?No current facility-administered medications for this visit. (Ophthalmic Drugs)   ? ?Current Outpatient Medications (Other)  ?Medication Sig  ? acyclovir (ZOVIRAX) 400 MG tablet Take 400 mg by mouth 2 (two) times daily.  ? apixaban (ELIQUIS) 5 MG TABS tablet Take 1 tablet by mouth twice daily  ? cinacalcet (SENSIPAR) 30 MG tablet Take 30 mg by mouth 3 (three) times a week.  ? clotrimazole-betamethasone (LOTRISONE) cream Apply 1 application topically 2 (two) times daily.  ? losartan (COZAAR) 25 MG tablet Take 0.5 tablets (12.5 mg total) by mouth daily.  ? metoprolol succinate (TOPROL XL) 25 MG 24 hr tablet Take 1 tablet (25 mg total) by mouth at bedtime.  ? nitroGLYCERIN (NITROSTAT) 0.4 MG SL tablet Place 1 tablet (0.4 mg total) under the tongue every 5 (five) minutes x 3 doses as needed for chest pain.  ? rosuvastatin (CRESTOR) 10 MG tablet Take 1 tablet (10 mg total) by mouth daily.  ? sevelamer carbonate (RENVELA) 800 MG tablet Take 800 mg by mouth 3 (three) times daily with meals.  ? ?No current facility-administered medications for this visit. (Other)  ? ?REVIEW OF SYSTEMS: ?ROS   ?Positive for: Genitourinary, Cardiovascular, Eyes, Respiratory ?Negative for: Constitutional, Gastrointestinal, Neurological, Skin, Musculoskeletal, HENT, Endocrine, Psychiatric, Allergic/Imm, Heme/Lymph ?Last edited by Roselee Nova D, COT on 07/30/2021  8:55 AM.  ?  ? ?ALLERGIES ?Allergies  ?Allergen Reactions  ? Indapamide Other (See Comments)  ? ?PAST MEDICAL HISTORY ?  Past Medical History:  ?Diagnosis Date  ? Atrial arrhythmia   ? atrial tachycardia with variable AV conduction versus atypical aflutter 01/10/19, rate control (02/06/19)  ? Cataract   ? Dyspnea   ? on exertion  ? ESRD (end stage renal disease) ([place])   ? Hemo- MWF, Polycystic kidney disease  ? Fatigue   ? History of kidney stones   ? removal of stone- cysto  ? Hyperlipidemia   ? Hyperparathyroidism, secondary renal ([place])   ? Hypertension   ? Hypoxemia 12/12/2013  ? Nonischemic cardiomyopathy ([place])   ? Er 25% 2015, 55 % 2013  ? OSA on CPAP   ? no  longer using cpap  ? OSA on CPAP 03/24/2014  ? Pneumonia   ? 2015ish  ? Prostate cancer ([place])   ? Ventricular tachycardia//Freq PVCs   ? Wears glasses   ? ?Past Surgical History:  ?Procedure Laterality Date  ? A-FLUTTER ABLATION N/A 04/11/2019  ? Procedure: A-FLUTTER ABLATION;  Surgeon: Evans Lance, MD;  Location: [facility];  Service: Cardiovascular;  Laterality: N/A;  ? A/V FISTULAGRAM Left 04/27/2017  ? Procedure: A/V FISTULAGRAM;  Surgeon: Conrad [place], MD;  Location: [facility];  Service: Cardiovascular;  Laterality: Left;  lt arm  ? A/V FISTULAGRAM Left 01/10/2019  ? Procedure: A/V FISTULAGRAM;  Surgeon: Marty Heck, MD;  Location: [facility];  Service: Cardiovascular;  Laterality: Left;  ? APPENDECTOMY    ? AV FISTULA PLACEMENT  12/05/2011  ? Procedure: ARTERIOVENOUS (AV) FISTULA CREATION;LLEFT ARM  Surgeon: Conrad [place], MD;  Location: [facility];  Service: Vascular;  Laterality: Left;  RADIO-CEPHALIC  fistula left arm  ? AV FISTULA PLACEMENT  01/11/2012  ? Procedure: ARTERIOVENOUS (AV) FISTULA CREATION;  Surgeon: Conrad Dacula, MD;  Location: [facility];  Service: Vascular;  Laterality: Left;  Creation of left brachial cephalic arteriovenous fistula  ? AV FISTULA PLACEMENT Right 03/07/2019  ? Procedure: ARTERIOVENOUS (AV) FISTULA CREATION  RIGHT ARM;  Surgeon: Marty Heck, MD;  Location: [facility];  Service: Vascular;  Laterality: Right;  ? BASCILIC VEIN TRANSPOSITION Left 12/27/2016  ? Procedure: BASILIC VEIN TRANSPOSITION LEFT UPPER ARM FIRST STAGE;  Surgeon: Conrad [place], MD;  Location: [facility];  Service: Vascular;  Laterality: Left;  ? BASCILIC VEIN TRANSPOSITION Left 01/31/2017  ? Procedure: LEFT ARM BASILIC VEIN TRANSPOSITION, SECOND STAGE;  Surgeon: Conrad , MD;  Location: [facility];  Service: Vascular;  Laterality: Left;  ? CARDIAC CATHETERIZATION  04-05-2010  ? checking for blockage but none-[facility]  ? COLONOSCOPY    ? CYSTOSCOPY W/ STONE MANIPULATION    ? "laser once"  (01/22/2013)  ? HEMATOMA EVACUATION Left 05/09/2017  ? Procedure: EVACUATION HEMATOMA LEFT ARM;  Surgeon: Conrad , MD;  Location: [facility];  Service: Vascular;  Laterality: Left;  ? HERNIA REPAIR    ? INGUINAL HERNIA REPAIR Right 11/06/2015  ? Procedure: OPEN HERNIA REPAIR  RIGHT INGUINAL ADULT;  Surgeon: [name], MD;  Location: [facility];  Service: General;  Laterality: Right;  with MESH  ? INSERTION OF DIALYSIS CATHETER Right 10/05/2016  ? Procedure: INSERTION OF right internal jugular DIALYSIS CATHETER;  Surgeon: [name], MD;  Location: [facility];  Service: Vascular;  Laterality: Right;  ? INSERTION OF MESH  03/20/2012  ? Procedure: INSERTION OF MESH;  UMB Surgeon: [name], MD;  Location: [facility];  Service: General;  Laterality: N/A;  ? INSERTION OF MESH N/A 01/22/2013  ?  Procedure: INSERTION OF MESH;  Surgeon: [name], MD;  Location: [facility];  Service: General;  Laterality: N/A;  ? LAPAROTOMY  04/02/2012  ? Procedure: EXPLORATORY LAPAROTOMY;  Surgeon: [name], MD;  Location: [facility];  Service: General;  Laterality: N/A;  Exploratory Laparotomy with resection of small intestine  ? LEFT HEART CATHETERIZATION WITH CORONARY ANGIOGRAM N/A 05/14/2013  ? Procedure: LEFT HEART CATHETERIZATION WITH CORONARY ANGIOGRAM;  Surgeon: Sinclair Grooms, MD;  [facility];  Service: Cardiovascular;  Laterality: N/A;  ? LIGATION OF ARTERIOVENOUS  FISTULA Left 12/27/2016  ? Procedure: LIGATION/EXCISION OF LEFT UPPER ARM ARTERIOVENOUS  FISTULA;  EVACUATION OF HEMATOMA;  Surgeon: Conrad [place], MD;  Location: [facility];  Service: Vascular;  Laterality: Left;  ? LIGATION OF ARTERIOVENOUS  FISTULA Left 03/07/2019  ? Procedure: LIGATION OF ARTERIOVENOUS FISTULA  LEFT UPPER ARM;  Surgeon: Marty Heck, MD;  Location: [facility];  Service: Vascular;  Laterality: Left;  ? REVISON OF ARTERIOVENOUS FISTULA Left 10/05/2016  ? Procedure: REVISON OF left arm ARTERIOVENOUS FISTULA;  Surgeon: [name], MD;   Location: [facility];  Service: Vascular;  Laterality: Left;  ? TONSILLECTOMY    ? UMBILICAL HERNIA REPAIR  03/20/2012  ? Procedure: HERNIA REPAIR UMBILICAL ADULT;  Surgeon: [name], MD;  Location: [facility];  Service:

## 2021-07-31 DIAGNOSIS — Z992 Dependence on renal dialysis: Secondary | ICD-10-CM | POA: Diagnosis not present

## 2021-07-31 DIAGNOSIS — D631 Anemia in chronic kidney disease: Secondary | ICD-10-CM | POA: Diagnosis not present

## 2021-07-31 DIAGNOSIS — N2581 Secondary hyperparathyroidism of renal origin: Secondary | ICD-10-CM | POA: Diagnosis not present

## 2021-07-31 DIAGNOSIS — Q612 Polycystic kidney, adult type: Secondary | ICD-10-CM | POA: Diagnosis not present

## 2021-07-31 DIAGNOSIS — N186 End stage renal disease: Secondary | ICD-10-CM | POA: Diagnosis not present

## 2021-08-02 ENCOUNTER — Encounter (INDEPENDENT_AMBULATORY_CARE_PROVIDER_SITE_OTHER): Payer: Self-pay | Admitting: Ophthalmology

## 2021-08-03 DIAGNOSIS — D631 Anemia in chronic kidney disease: Secondary | ICD-10-CM | POA: Diagnosis not present

## 2021-08-03 DIAGNOSIS — Z992 Dependence on renal dialysis: Secondary | ICD-10-CM | POA: Diagnosis not present

## 2021-08-03 DIAGNOSIS — N186 End stage renal disease: Secondary | ICD-10-CM | POA: Diagnosis not present

## 2021-08-03 DIAGNOSIS — N2581 Secondary hyperparathyroidism of renal origin: Secondary | ICD-10-CM | POA: Diagnosis not present

## 2021-08-03 NOTE — Progress Notes (Signed)
?[facility] Note ? ?08/04/2021 ?  ? ?CHIEF COMPLAINT ?Patient presents for Retina Follow Up ? ? ?HISTORY OF PRESENT ILLNESS: ?Michael [name]. is a 66 y.o. male who presents to the clinic today for:  ? ?HPI   ? ? Retina Follow Up   ?Patient presents with  Other.  I, the attending physician,  performed the HPI with the patient and updated documentation appropriately. ? ?  ?  ? ? Comments   ?Patient states vision fluctuates OS. No eye pain. Has not started lotemax. Picking up RX today, had to order lotemax. Using cosopt and brimonidine tid OS, vyzulta qhs OS, atropine bid OS, and muro 128 gtts qid OS.  ? ?  ?  ?Last edited by [name], MD on 08/04/2021  1:15 PM.  ?  ?pt states he ran out of the Lotemax sample about 3 days ago, the pharmacy had to order it and he picks it up when he leaves here, Dr. Lucianne Lei re-started him on acyclovir, which he has been taking as directed ? ?Referring physician: ?[name], MD ?[address] ?[address] ?[place],  [place] [address] ? ?HISTORICAL INFORMATION:  ? ?Selected notes from the [facility] ?Referred by Dr. [name]. Hecker for concern of HRVO OD; ?LEE- 11.30.18 ([name]) {BCVA OD: 20/100-1 OS: 20/20-2] ?Ocular Hx- cataract OU ?PMH- HTN, high chol, kidney disease, sleep apnea, emphysema ?  ? ?CURRENT MEDICATIONS: ?Current Outpatient Medications (Ophthalmic Drugs)  ?Medication Sig  ? atropine 1 % ophthalmic solution Place 1 drop into the left eye in the morning and at bedtime.  ? brimonidine (ALPHAGAN) 0.2 % ophthalmic solution Place 1 drop into the left eye 3 (three) times daily.  ? dorzolamide-timolol (COSOPT) 22.3-6.8 MG/ML ophthalmic solution Place 1 drop into the left eye 3 (three) times daily.  ? sodium chloride (MURO 128) 2 % ophthalmic solution Place 1 drop into the left eye 4 (four) times daily.  ? Loteprednol Etabonate (LOTEMAX SM) 0.38 % GEL Place 1 drop into the left eye every 2 (two) hours. (Patient not taking: Reported on  08/04/2021)  ? ?No current facility-administered medications for this visit. (Ophthalmic Drugs)  ? ?Current Outpatient Medications (Other)  ?Medication Sig  ? acyclovir (ZOVIRAX) 400 MG tablet Take 400 mg by mouth 2 (two) times daily.  ? apixaban (ELIQUIS) 5 MG TABS tablet Take 1 tablet by mouth twice daily  ? cinacalcet (SENSIPAR) 30 MG tablet Take 30 mg by mouth 3 (three) times a week.  ? clotrimazole-betamethasone (LOTRISONE) cream Apply 1 application topically 2 (two) times daily.  ? losartan (COZAAR) 25 MG tablet Take 0.5 tablets (12.5 mg total) by mouth daily.  ? metoprolol succinate (TOPROL XL) 25 MG 24 hr tablet Take 1 tablet (25 mg total) by mouth at bedtime.  ? nitroGLYCERIN (NITROSTAT) 0.4 MG SL tablet Place 1 tablet (0.4 mg total) under the tongue every 5 (five) minutes x 3 doses as needed for chest pain.  ? rosuvastatin (CRESTOR) 10 MG tablet Take 1 tablet (10 mg total) by mouth daily.  ? sevelamer carbonate (RENVELA) 800 MG tablet Take 800 mg by mouth 3 (three) times daily with meals.  ? ?No current facility-administered medications for this visit. (Other)  ? ?REVIEW OF SYSTEMS: ?ROS   ?Positive for: Genitourinary, Cardiovascular, Eyes, Respiratory ?Negative for: Constitutional, Gastrointestinal, Neurological, Skin, Musculoskeletal, HENT, Endocrine, Psychiatric, Allergic/Imm, Heme/Lymph ?Last edited by Roselee Nova D, COT on 08/04/2021  9:13 AM.  ?  ? ? ?ALLERGIES ?  Allergies  ?Allergen Reactions  ? Indapamide Other (See Comments)  ? ?PAST MEDICAL HISTORY ?Past Medical History:  ?Diagnosis Date  ? Atrial arrhythmia   ? atrial tachycardia with variable AV conduction versus atypical aflutter 01/10/19, rate control (02/06/19)  ? Cataract   ? Dyspnea   ? on exertion  ? ESRD (end stage renal disease) ([facility])   ? Hemo- MWF, Polycystic kidney disease  ? Fatigue   ? History of kidney stones   ? removal of stone- cysto  ? Hyperlipidemia   ? Hyperparathyroidism, secondary renal ([facility])   ? Hypertension   ? Hypoxemia  12/12/2013  ? Nonischemic cardiomyopathy ([place])   ? Er 25% 2015, 55 % 2013  ? OSA on CPAP   ? no longer using cpap  ? OSA on CPAP 03/24/2014  ? Pneumonia   ? 2015ish  ? Prostate cancer ([facility] [place])   ? Ventricular tachycardia//Freq PVCs   ? Wears glasses   ? ?Past Surgical History:  ?Procedure Laterality Date  ? A-FLUTTER ABLATION N/A 04/11/2019  ? Procedure: A-FLUTTER ABLATION;  Surgeon: Evans Lance, MD;  Location: [facility];  Service: Cardiovascular;  Laterality: N/A;  ? A/V FISTULAGRAM Left 04/27/2017  ? Procedure: A/V FISTULAGRAM;  Surgeon: Conrad Weinert, MD;  Location: [facility];  Service: Cardiovascular;  Laterality: Left;  lt arm  ? A/V FISTULAGRAM Left 01/10/2019  ? Procedure: A/V FISTULAGRAM;  Surgeon: Marty Heck, MD;  Location: [facility];  Service: Cardiovascular;  Laterality: Left;  ? APPENDECTOMY    ? AV FISTULA PLACEMENT  12/05/2011  ? Procedure: ARTERIOVENOUS (AV) FISTULA CREATION;LLEFT ARM  Surgeon: Conrad [place], MD;  Location: [facility];  Service: Vascular;  Laterality: Left;  RADIO-CEPHALIC  fistula left arm  ? AV FISTULA PLACEMENT  01/11/2012  ? Procedure: ARTERIOVENOUS (AV) FISTULA CREATION;  Surgeon: Conrad [place], MD;  Location: [facility];  Service: Vascular;  Laterality: Left;  Creation of left brachial cephalic arteriovenous fistula  ? AV FISTULA PLACEMENT Right 03/07/2019  ? Procedure: ARTERIOVENOUS (AV) FISTULA CREATION  RIGHT ARM;  Surgeon: Marty Heck, MD;  Location: [facility];  Service: Vascular;  Laterality: Right;  ? BASCILIC VEIN TRANSPOSITION Left 12/27/2016  ? Procedure: BASILIC VEIN TRANSPOSITION LEFT UPPER ARM FIRST STAGE;  Surgeon: Conrad [place]wood, MD;  Location: [facility];  Service: Vascular;  Laterality: Left;  ? BASCILIC VEIN TRANSPOSITION Left 01/31/2017  ? Procedure: LEFT ARM BASILIC VEIN TRANSPOSITION, SECOND STAGE;  Surgeon: Conrad [place], MD;  Location: [facility];  Service: Vascular;  Laterality: Left;  ? CARDIAC CATHETERIZATION  04-05-2010  ? checking for  blockage but none-[facility]  ? COLONOSCOPY    ? CYSTOSCOPY W/ STONE MANIPULATION    ? "laser once" (01/22/2013)  ? HEMATOMA EVACUATION Left 05/09/2017  ? Procedure: EVACUATION HEMATOMA LEFT ARM;  Surgeon: Conrad [place], MD;  Location: [facility];  Service: Vascular;  Laterality: Left;  ? HERNIA REPAIR    ? INGUINAL HERNIA REPAIR Right 11/06/2015  ? Procedure: OPEN HERNIA REPAIR  RIGHT INGUINAL ADULT;  Surgeon: [name], MD;  Location[facility];  Service: General;  Laterality: Right;  with MESH  ? INSERTION OF DIALYSIS CATHETER Right 10/05/2016  ? Procedure: INSERTION OF right internal jugular DIALYSIS CATHETER;  Surgeon: [name], MD;  Location: [facility];  Service: Vascular;  Laterality: Right;  ? INSERTION OF MESH  03/20/2012  ? Procedure: INSERTION OF MESH;  UMB Surgeon: [name], MD;  Location: [facility];  Service: General;  Laterality: N/A;  ? INSERTION OF MESH N/A 01/22/2013  ? Procedure: INSERTION OF MESH;  Surgeon: [name], MD;  Location: [facility];  Service: General;  Laterality: N/A;  ? LAPAROTOMY  04/02/2012  ? Procedure: EXPLORATORY LAPAROTOMY;  Surgeon: [name], MD;  Location: [facility];  Service: General;  Laterality: N/A;  Exploratory Laparotomy with resection of small intestine  ? LEFT HEART CATHETERIZATION WITH CORONARY ANGIOGRAM N/A 05/14/2013  ? Procedure: LEFT HEART CATHETERIZATION WITH CORONARY ANGIOGRAM;  Surgeon: Sinclair Grooms, MD;  Location[facility];  Service: Cardiovascular;  Laterality: N/A;  ? LIGATION OF ARTERIOVENOUS  FISTULA Left 12/27/2016  ? Procedure: LIGATION/EXCISION OF LEFT UPPER ARM ARTERIOVENOUS  FISTULA;  EVACUATION OF HEMATOMA;  Surgeon: Conrad Franquez, MD;  Location: [facility];  Service: Vascular;  Laterality: Left;  ? LIGATION OF ARTERIOVENOUS  FISTULA Left 03/07/2019  ? Procedure: LIGATION OF ARTERIOVENOUS FISTULA  LEFT UPPER ARM;  Surgeon: Marty Heck, MD;  Location: [facility];  Service: Vascular;  Laterality: Left;  ? REVISON OF ARTERIOVENOUS FISTULA Left  10/05/2016  ? Procedure: REVISON OF left arm ARTERIOVENOUS FISTULA;  Surgeon: [name], MD;  Location[facility];  Service: Vascular;  Laterality: Left;  ? TONSILLECTOMY    ? UMBILICAL HERNIA REPAIR  03/20/2012  ? Procedure: HE

## 2021-08-04 ENCOUNTER — Ambulatory Visit (INDEPENDENT_AMBULATORY_CARE_PROVIDER_SITE_OTHER): Payer: Medicare Other | Admitting: Ophthalmology

## 2021-08-04 ENCOUNTER — Encounter (INDEPENDENT_AMBULATORY_CARE_PROVIDER_SITE_OTHER): Payer: Self-pay | Admitting: Ophthalmology

## 2021-08-04 DIAGNOSIS — H35412 Lattice degeneration of retina, left eye: Secondary | ICD-10-CM

## 2021-08-04 DIAGNOSIS — H4052X2 Glaucoma secondary to other eye disorders, left eye, moderate stage: Secondary | ICD-10-CM

## 2021-08-04 DIAGNOSIS — B0052 Herpesviral keratitis: Secondary | ICD-10-CM

## 2021-08-04 DIAGNOSIS — H25813 Combined forms of age-related cataract, bilateral: Secondary | ICD-10-CM

## 2021-08-04 DIAGNOSIS — H35033 Hypertensive retinopathy, bilateral: Secondary | ICD-10-CM | POA: Diagnosis not present

## 2021-08-04 DIAGNOSIS — H34831 Tributary (branch) retinal vein occlusion, right eye, with macular edema: Secondary | ICD-10-CM

## 2021-08-04 DIAGNOSIS — H33332 Multiple defects of retina without detachment, left eye: Secondary | ICD-10-CM | POA: Diagnosis not present

## 2021-08-04 DIAGNOSIS — I10 Essential (primary) hypertension: Secondary | ICD-10-CM

## 2021-08-04 DIAGNOSIS — H34812 Central retinal vein occlusion, left eye, with macular edema: Secondary | ICD-10-CM | POA: Diagnosis not present

## 2021-08-04 DIAGNOSIS — H43813 Vitreous degeneration, bilateral: Secondary | ICD-10-CM

## 2021-08-04 DIAGNOSIS — H209 Unspecified iridocyclitis: Secondary | ICD-10-CM

## 2021-08-05 NOTE — Progress Notes (Signed)
?[facility] Note ? ?08/06/2021 ?  ? ?CHIEF COMPLAINT ?Patient presents for Retina Follow Up ? ? ?HISTORY OF PRESENT ILLNESS: ?Michael [name]. is a 66 y.o. male who presents to the clinic today for:  ? ?HPI   ? ? Retina Follow Up   ?Patient presents with  Other.  In left eye.  Duration of 2 days.  Since onset it is gradually improving.  I, the attending physician,  performed the HPI with the patient and updated documentation appropriately. ? ?  ?  ? ? Comments   ?2 day follow up NVG OS-  Patient thinks vision is a little better, but still blurry.   ?Taking Cosopt TID, Brimonidine TID, Lotemax q2h, Vyzulta qhs, Atropine BID, and muro 128 QID OS. ? ?  ?  ?Last edited by [name], MD on 08/08/2021  2:05 AM.  ?  ? ?Patient states vision is a little better, but still blurry. ? ?Referring physician: ?[name], MD ?[address] ?[address] ?[place],  [place] [address] ? ?HISTORICAL INFORMATION:  ? ?Selected notes from the [facility] ?Referred by Dr. [name]. Hecker for concern of HRVO OD; ?LEE- 11.30.18 ([name]) {BCVA OD: 20/100-1 OS: 20/20-2] ?Ocular Hx- cataract OU ?PMH- HTN, high chol, kidney disease, sleep apnea, emphysema ?  ? ?CURRENT MEDICATIONS: ?Current Outpatient Medications (Ophthalmic Drugs)  ?Medication Sig  ? atropine 1 % ophthalmic solution Place 1 drop into the left eye in the morning and at bedtime.  ? brimonidine (ALPHAGAN) 0.2 % ophthalmic solution Place 1 drop into the left eye 3 (three) times daily.  ? dorzolamide-timolol (COSOPT) 22.3-6.8 MG/ML ophthalmic solution Place 1 drop into the left eye 3 (three) times daily.  ? Loteprednol Etabonate (LOTEMAX SM) 0.38 % GEL Place 1 drop into the left eye every 2 (two) hours. (Patient not taking: Reported on 08/04/2021)  ? sodium chloride (MURO 128) 2 % ophthalmic solution Place 1 drop into the left eye 4 (four) times daily.  ? ?No current facility-administered medications for this visit. (Ophthalmic Drugs)  ? ?Current  Outpatient Medications (Other)  ?Medication Sig  ? acetaZOLAMIDE (DIAMOX) 250 MG tablet Take 1 tablet (250 mg total) by mouth in the morning and at bedtime.  ? acyclovir (ZOVIRAX) 400 MG tablet Take 400 mg by mouth 2 (two) times daily.  ? apixaban (ELIQUIS) 5 MG TABS tablet Take 1 tablet by mouth twice daily  ? cinacalcet (SENSIPAR) 30 MG tablet Take 30 mg by mouth 3 (three) times a week.  ? clotrimazole-betamethasone (LOTRISONE) cream Apply 1 application topically 2 (two) times daily.  ? losartan (COZAAR) 25 MG tablet Take 0.5 tablets (12.5 mg total) by mouth daily.  ? metoprolol succinate (TOPROL XL) 25 MG 24 hr tablet Take 1 tablet (25 mg total) by mouth at bedtime.  ? nitroGLYCERIN (NITROSTAT) 0.4 MG SL tablet Place 1 tablet (0.4 mg total) under the tongue every 5 (five) minutes x 3 doses as needed for chest pain.  ? rosuvastatin (CRESTOR) 10 MG tablet Take 1 tablet (10 mg total) by mouth daily.  ? sevelamer carbonate (RENVELA) 800 MG tablet Take 800 mg by mouth 3 (three) times daily with meals.  ? ?No current facility-administered medications for this visit. (Other)  ? ?REVIEW OF SYSTEMS: ?ROS   ?Positive for: Genitourinary, Cardiovascular, Eyes, Respiratory ?Negative for: Constitutional, Gastrointestinal, Neurological, Skin, Musculoskeletal, HENT, Endocrine, Psychiatric, Allergic/Imm, Heme/Lymph ?Last edited by Leonie Douglas, COA on 08/06/2021  9:36 AM.  ?  ? ?  ALLERGIES ?Allergies  ?Allergen Reactions  ? Indapamide Other (See Comments)  ? ?PAST MEDICAL HISTORY ?Past Medical History:  ?Diagnosis Date  ? Atrial arrhythmia   ? atrial tachycardia with variable AV conduction versus atypical aflutter 01/10/19, rate control (02/06/19)  ? Cataract   ? Dyspnea   ? on exertion  ? ESRD (end stage renal disease) ([place])   ? Hemo- MWF, Polycystic kidney disease  ? Fatigue   ? History of kidney stones   ? removal of stone- cysto  ? Hyperlipidemia   ? Hyperparathyroidism, secondary renal ([place])   ? Hypertension   ? Hypoxemia  12/12/2013  ? Nonischemic cardiomyopathy ([place])   ? Er 25% 2015, 55 % 2013  ? OSA on CPAP   ? no longer using cpap  ? OSA on CPAP 03/24/2014  ? Pneumonia   ? 2015ish  ? Prostate cancer ([place])   ? Ventricular tachycardia//Freq PVCs   ? Wears glasses   ? ?Past Surgical History:  ?Procedure Laterality Date  ? A-FLUTTER ABLATION N/A 04/11/2019  ? Procedure: A-FLUTTER ABLATION;  Surgeon: Evans Lance, MD;  Location: [facility];  Service: Cardiovascular;  Laterality: N/A;  ? A/V FISTULAGRAM Left 04/27/2017  ? Procedure: A/V FISTULAGRAM;  Surgeon: Conrad Dryden, MD;  Location: [facility];  Service: Cardiovascular;  Laterality: Left;  lt arm  ? A/V FISTULAGRAM Left 01/10/2019  ? Procedure: A/V FISTULAGRAM;  Surgeon: Marty Heck, MD;  Location: [facility];  Service: Cardiovascular;  Laterality: Left;  ? APPENDECTOMY    ? AV FISTULA PLACEMENT  12/05/2011  ? Procedure: ARTERIOVENOUS (AV) FISTULA CREATION;LLEFT ARM  Surgeon: Conrad Luxora, MD;  Location: [facility];  Service: Vascular;  Laterality: Left;  RADIO-CEPHALIC  fistula left arm  ? AV FISTULA PLACEMENT  01/11/2012  ? Procedure: ARTERIOVENOUS (AV) FISTULA CREATION;  Surgeon: Conrad Gila, MD;  Location: [facility];  Service: Vascular;  Laterality: Left;  Creation of left brachial cephalic arteriovenous fistula  ? AV FISTULA PLACEMENT Right 03/07/2019  ? Procedure: ARTERIOVENOUS (AV) FISTULA CREATION  RIGHT ARM;  Surgeon: Marty Heck, MD;  Location: [facility];  Service: Vascular;  Laterality: Right;  ? BASCILIC VEIN TRANSPOSITION Left 12/27/2016  ? Procedure: BASILIC VEIN TRANSPOSITION LEFT UPPER ARM FIRST STAGE;  Surgeon: Conrad Kaneohe, MD;  Location: [facility];  Service: Vascular;  Laterality: Left;  ? BASCILIC VEIN TRANSPOSITION Left 01/31/2017  ? Procedure: LEFT ARM BASILIC VEIN TRANSPOSITION, SECOND STAGE;  Surgeon: Conrad Van Dyne, MD;  Location: [facility];  Service: Vascular;  Laterality: Left;  ? CARDIAC CATHETERIZATION  04-05-2010  ? checking for  blockage but none-[facility]  ? COLONOSCOPY    ? CYSTOSCOPY W/ STONE MANIPULATION    ? "laser once" (01/22/2013)  ? HEMATOMA EVACUATION Left 05/09/2017  ? Procedure: EVACUATION HEMATOMA LEFT ARM;  Surgeon: Conrad Dora, MD;  Location: [facility];  Service: Vascular;  Laterality: Left;  ? HERNIA REPAIR    ? INGUINAL HERNIA REPAIR Right 11/06/2015  ? Procedure: OPEN HERNIA REPAIR  RIGHT INGUINAL ADULT;  Surgeon: [name], MD;  [facility];  Service: General;  Laterality: Right;  with MESH  ? INSERTION OF DIALYSIS CATHETER Right 10/05/2016  ? Procedure: INSERTION OF right internal jugular DIALYSIS CATHETER;  Surgeon: [name], MD;  Location: [facility];  Service: Vascular;  Laterality: Right;  ? INSERTION OF MESH  03/20/2012  ? Procedure: INSERTION OF MESH;  UMB Surgeon: [name], MD;  [facility]  OR;  Service: General;  Laterality: N/A;  ? INSERTION OF MESH N/A 01/22/2013  ? Procedure: INSERTION OF MESH;  Surgeon: [name], MD;  Location: [facility];  Service: General;  Laterality: N/A;  ? LAPAROTOMY  04/02/2012  ? Procedure: EXPLORATORY LAPAROTOMY;  Surgeon: [name], MD;  Location: [facility];  Service: General;  Laterality: N/A;  Exploratory Laparotomy with resection of small intestine  ? LEFT HEART CATHETERIZATION WITH CORONARY ANGIOGRAM N/A 05/14/2013  ? Procedure: LEFT HEART CATHETERIZATION WITH CORONARY ANGIOGRAM;  Surgeon: Sinclair Grooms, MD;  Location: [facility] CATH LAB;  Service: Cardiovascular;  Laterality: N/A;  ? LIGATION OF ARTERIOVENOUS  FISTULA Left 12/27/2016  ? Procedure: LIGATION/EXCISION OF LEFT UPPER ARM ARTERIOVENOUS  FISTULA;  EVACUATION OF HEMATOMA;  Surgeon: Conrad Webb, MD;  Location: [facility];  Service: Vascular;  Laterality: Left;  ? LIGATION OF ARTERIOVENOUS  FISTULA Left 03/07/2019  ? Procedure: LIGATION OF ARTERIOVENOUS FISTULA  LEFT UPPER ARM;  Surgeon: Marty Heck, MD;  Location: [facility];  Service: Vascular;  Laterality: Left;  ? REVISON OF ARTERIOVENOUS FISTULA Left  10/05/2016  ? Procedure: REVISON OF left arm ARTERIOVENOUS FISTULA;  Surgeon: [name], MD;  Location: [facility] OR;  Service: Vascular;  Laterality: Left;  ? TONSILLECTOMY    ? UMBILICAL HERNIA REPAIR  03/20/2012  ? [place]

## 2021-08-06 ENCOUNTER — Ambulatory Visit (INDEPENDENT_AMBULATORY_CARE_PROVIDER_SITE_OTHER): Payer: Medicare Other | Admitting: Ophthalmology

## 2021-08-06 DIAGNOSIS — H35412 Lattice degeneration of retina, left eye: Secondary | ICD-10-CM | POA: Diagnosis not present

## 2021-08-06 DIAGNOSIS — H43813 Vitreous degeneration, bilateral: Secondary | ICD-10-CM

## 2021-08-06 DIAGNOSIS — I10 Essential (primary) hypertension: Secondary | ICD-10-CM

## 2021-08-06 DIAGNOSIS — H25813 Combined forms of age-related cataract, bilateral: Secondary | ICD-10-CM

## 2021-08-06 DIAGNOSIS — H33332 Multiple defects of retina without detachment, left eye: Secondary | ICD-10-CM | POA: Diagnosis not present

## 2021-08-06 DIAGNOSIS — H1820 Unspecified corneal edema: Secondary | ICD-10-CM | POA: Diagnosis not present

## 2021-08-06 DIAGNOSIS — H35033 Hypertensive retinopathy, bilateral: Secondary | ICD-10-CM | POA: Diagnosis not present

## 2021-08-06 DIAGNOSIS — H209 Unspecified iridocyclitis: Secondary | ICD-10-CM | POA: Diagnosis not present

## 2021-08-06 DIAGNOSIS — B0052 Herpesviral keratitis: Secondary | ICD-10-CM

## 2021-08-06 DIAGNOSIS — H4052X2 Glaucoma secondary to other eye disorders, left eye, moderate stage: Secondary | ICD-10-CM | POA: Diagnosis not present

## 2021-08-06 DIAGNOSIS — H34812 Central retinal vein occlusion, left eye, with macular edema: Secondary | ICD-10-CM

## 2021-08-06 DIAGNOSIS — H34831 Tributary (branch) retinal vein occlusion, right eye, with macular edema: Secondary | ICD-10-CM

## 2021-08-06 MED ORDER — ACETAZOLAMIDE 250 MG PO TABS: 250.0000 mg | ORAL_TABLET | Freq: Two times a day (BID) | ORAL | 0 refills | Status: DC

## 2021-08-06 NOTE — Progress Notes (Signed)
?[facility] Note ? ?08/09/2021 ?  ? ?CHIEF COMPLAINT ?Patient presents for Retina Follow Up ? ? ?HISTORY OF PRESENT ILLNESS: ?Michael [name]. is a 66 y.o. male who presents to the clinic today for:  ? ?HPI   ? ? Retina Follow Up   ?Patient presents with  CRVO/BRVO.  In left eye.  Severity is moderate.  Duration of 3 days.  I, the attending physician,  performed the HPI with the patient and updated documentation appropriately. ? ?  ?  ? ? Comments   ?Patient states vision the same OU. No eye pain. Using gtts and diamox as instructed.  ? ?  ?  ?Last edited by [name], MD on 08/09/2021 11:03 AM.  ?  ? ?pt states  ? ?Referring physician: ?[name], MD ?[address] ?[address] ?[place],  [place] [address] ? ?HISTORICAL INFORMATION:  ? ?Selected notes from the [facility] ?Referred by Dr. [name]. Hecker for concern of HRVO OD; ?LEE- 11.30.18 ([name]) {BCVA OD: 20/100-1 OS: 20/20-2] ?Ocular Hx- cataract OU ?PMH- HTN, high chol, kidney disease, sleep apnea, emphysema ?  ? ?CURRENT MEDICATIONS: ?Current Outpatient Medications (Ophthalmic Drugs)  ?Medication Sig  ? atropine 1 % ophthalmic solution Place 1 drop into the left eye in the morning and at bedtime.  ? brimonidine (ALPHAGAN) 0.2 % ophthalmic solution Place 1 drop into the left eye 3 (three) times daily.  ? dorzolamide-timolol (COSOPT) 22.3-6.8 MG/ML ophthalmic solution Place 1 drop into the left eye 3 (three) times daily.  ? Loteprednol Etabonate (LOTEMAX SM) 0.38 % GEL Place 1 drop into the left eye every 2 (two) hours.  ? sodium chloride (MURO 128) 2 % ophthalmic solution Place 1 drop into the left eye 4 (four) times daily.  ? ?No current facility-administered medications for this visit. (Ophthalmic Drugs)  ? ?Current Outpatient Medications (Other)  ?Medication Sig  ? acetaZOLAMIDE (DIAMOX) 250 MG tablet Take 1 tablet (250 mg total) by mouth in the morning and at bedtime.  ? acyclovir (ZOVIRAX) 400 MG tablet Take  400 mg by mouth 2 (two) times daily.  ? apixaban (ELIQUIS) 5 MG TABS tablet Take 1 tablet by mouth twice daily  ? cinacalcet (SENSIPAR) 30 MG tablet Take 30 mg by mouth 3 (three) times a week.  ? clotrimazole-betamethasone (LOTRISONE) cream Apply 1 application topically 2 (two) times daily.  ? losartan (COZAAR) 25 MG tablet Take 0.5 tablets (12.5 mg total) by mouth daily.  ? metoprolol succinate (TOPROL XL) 25 MG 24 hr tablet Take 1 tablet (25 mg total) by mouth at bedtime.  ? nitroGLYCERIN (NITROSTAT) 0.4 MG SL tablet Place 1 tablet (0.4 mg total) under the tongue every 5 (five) minutes x 3 doses as needed for chest pain.  ? rosuvastatin (CRESTOR) 10 MG tablet Take 1 tablet (10 mg total) by mouth daily.  ? sevelamer carbonate (RENVELA) 800 MG tablet Take 800 mg by mouth 3 (three) times daily with meals.  ? ?No current facility-administered medications for this visit. (Other)  ? ?REVIEW OF SYSTEMS: ?ROS   ?Positive for: Genitourinary, Cardiovascular, Eyes, Respiratory ?Negative for: Constitutional, Gastrointestinal, Neurological, Skin, Musculoskeletal, HENT, Endocrine, Psychiatric, Allergic/Imm, Heme/Lymph ?Last edited by Roselee Nova D, COT on 08/09/2021  9:31 AM.  ?  ? ?ALLERGIES ?Allergies  ?Allergen Reactions  ? Indapamide Other (See Comments)  ? ?PAST MEDICAL HISTORY ?Past Medical History:  ?Diagnosis Date  ? Atrial arrhythmia   ? atrial tachycardia with variable AV conduction  versus atypical aflutter 01/10/19, rate control (02/06/19)  ? Cataract   ? Dyspnea   ? on exertion  ? ESRD (end stage renal disease) ([place])   ? Hemo- MWF, Polycystic kidney disease  ? Fatigue   ? History of kidney stones   ? removal of stone- cysto  ? Hyperlipidemia   ? Hyperparathyroidism, secondary renal ([place])   ? Hypertension   ? Hypoxemia 12/12/2013  ? Nonischemic cardiomyopathy ([place])   ? Er 25% 2015, 55 % 2013  ? OSA on CPAP   ? no longer using cpap  ? OSA on CPAP 03/24/2014  ? Pneumonia   ? 2015ish  ? Prostate cancer ([place])   ?  Ventricular tachycardia//Freq PVCs   ? Wears glasses   ? ?Past Surgical History:  ?Procedure Laterality Date  ? A-FLUTTER ABLATION N/A 04/11/2019  ? Procedure: A-FLUTTER ABLATION;  Surgeon: Evans Lance, MD;  Location: [facility];  Service: Cardiovascular;  Laterality: N/A;  ? A/V FISTULAGRAM Left 04/27/2017  ? Procedure: A/V FISTULAGRAM;  Surgeon: Conrad Ardmore, MD;  Location: [facility];  Service: Cardiovascular;  Laterality: Left;  lt arm  ? A/V FISTULAGRAM Left 01/10/2019  ? Procedure: A/V FISTULAGRAM;  Surgeon: Marty Heck, MD;  Location: [facility];  Service: Cardiovascular;  Laterality: Left;  ? APPENDECTOMY    ? AV FISTULA PLACEMENT  12/05/2011  ? Procedure: ARTERIOVENOUS (AV) FISTULA CREATION;LLEFT ARM  Surgeon: Conrad [place], MD;  Location: [facility];  Service: Vascular;  Laterality: Left;  RADIO-CEPHALIC  fistula left arm  ? AV FISTULA PLACEMENT  01/11/2012  ? Procedure: ARTERIOVENOUS (AV) FISTULA CREATION;  Surgeon: Conrad Medley, MD;  Location: [facility];  Service: Vascular;  Laterality: Left;  Creation of left brachial cephalic arteriovenous fistula  ? AV FISTULA PLACEMENT Right 03/07/2019  ? Procedure: ARTERIOVENOUS (AV) FISTULA CREATION  RIGHT ARM;  Surgeon: Marty Heck, MD;  Location: [facility];  Service: Vascular;  Laterality: Right;  ? BASCILIC VEIN TRANSPOSITION Left 12/27/2016  ? Procedure: BASILIC VEIN TRANSPOSITION LEFT UPPER ARM FIRST STAGE;  Surgeon: Conrad [place], MD;  Location: [facility];  Service: Vascular;  Laterality: Left;  ? BASCILIC VEIN TRANSPOSITION Left 01/31/2017  ? Procedure: LEFT ARM BASILIC VEIN TRANSPOSITION, SECOND STAGE;  Surgeon: Conrad [place], MD;  Location: [facility];  Service: Vascular;  Laterality: Left;  ? CARDIAC CATHETERIZATION  04-05-2010  ? checking for blockage but none-[facility]  ? COLONOSCOPY    ? CYSTOSCOPY W/ STONE MANIPULATION    ? "laser once" (01/22/2013)  ? HEMATOMA EVACUATION Left 05/09/2017  ? Procedure: EVACUATION HEMATOMA LEFT ARM;  Surgeon:  Conrad , MD;  Location: [facility];  Service: Vascular;  Laterality: Left;  ? HERNIA REPAIR    ? INGUINAL HERNIA REPAIR Right 11/06/2015  ? Procedure: OPEN HERNIA REPAIR  RIGHT INGUINAL ADULT;  Surgeon: [name], MD;  [facility];  Service: General;  Laterality: Right;  with MESH  ? INSERTION OF DIALYSIS CATHETER Right 10/05/2016  ? Procedure: INSERTION OF right internal jugular DIALYSIS CATHETER;  Surgeon: [name], MD;  Location: [facility];  Service: Vascular;  Laterality: Right;  ? INSERTION OF MESH  03/20/2012  ? Procedure: INSERTION OF MESH;  UMB Surgeon: [name], MD;  Location: [facility];  Service: General;  Laterality: N/A;  ? INSERTION OF MESH N/A 01/22/2013  ? Procedure: INSERTION OF MESH;  Surgeon: [name], MD;  Location: [facility];  Service: General;  Laterality: N/A;  ?  LAPAROTOMY  04/02/2012  ? Procedure: EXPLORATORY LAPAROTOMY;  Surgeon: [name], MD;  Location: [facility];  Service: General;  Laterality: N/A;  Exploratory Laparotomy with resection of small intestine  ? LEFT HEART CATHETERIZATION WITH CORONARY ANGIOGRAM N/A 05/14/2013  ? Procedure: LEFT HEART CATHETERIZATION WITH CORONARY ANGIOGRAM;  Surgeon: Sinclair Grooms, MD;  Location[facility];  Service: Cardiovascular;  Laterality: N/A;  ? LIGATION OF ARTERIOVENOUS  FISTULA Left 12/27/2016  ? Procedure: LIGATION/EXCISION OF LEFT UPPER ARM ARTERIOVENOUS  FISTULA;  EVACUATION OF HEMATOMA;  Surgeon: Conrad [place], MD;  Location: [facility];  Service: Vascular;  Laterality: Left;  ? LIGATION OF ARTERIOVENOUS  FISTULA Left 03/07/2019  ? Procedure: LIGATION OF ARTERIOVENOUS FISTULA  LEFT UPPER ARM;  Surgeon: Marty Heck, MD;  Location: [facility];  Service: Vascular;  Laterality: Left;  ? REVISON OF ARTERIOVENOUS FISTULA Left 10/05/2016  ? Procedure: REVISON OF left arm ARTERIOVENOUS FISTULA;  Surgeon: [name], MD;  Location: [facility] OR;  Service: Vascular;  Laterality: Left;  ? TONSILLECTOMY    ? UMBILICAL HERNIA REPAIR   03/20/2012  ? Procedure: HERNIA REPAIR UMBILICAL ADULT;  Surgeon: [name], MD;  Location: [facility];  Service: General;  Laterality: N/A;  ? UMBILICAL HERNIA REPAIR  01/22/2013  ? preperitoneal open procedure due

## 2021-08-07 DIAGNOSIS — Z992 Dependence on renal dialysis: Secondary | ICD-10-CM | POA: Diagnosis not present

## 2021-08-07 DIAGNOSIS — N186 End stage renal disease: Secondary | ICD-10-CM | POA: Diagnosis not present

## 2021-08-07 DIAGNOSIS — D631 Anemia in chronic kidney disease: Secondary | ICD-10-CM | POA: Diagnosis not present

## 2021-08-07 DIAGNOSIS — N2581 Secondary hyperparathyroidism of renal origin: Secondary | ICD-10-CM | POA: Diagnosis not present

## 2021-08-08 ENCOUNTER — Encounter (INDEPENDENT_AMBULATORY_CARE_PROVIDER_SITE_OTHER): Payer: Self-pay | Admitting: Ophthalmology

## 2021-08-09 ENCOUNTER — Ambulatory Visit (INDEPENDENT_AMBULATORY_CARE_PROVIDER_SITE_OTHER): Payer: Medicare Other | Admitting: Ophthalmology

## 2021-08-09 ENCOUNTER — Encounter (INDEPENDENT_AMBULATORY_CARE_PROVIDER_SITE_OTHER): Payer: Self-pay | Admitting: Ophthalmology

## 2021-08-09 DIAGNOSIS — H34812 Central retinal vein occlusion, left eye, with macular edema: Secondary | ICD-10-CM

## 2021-08-09 DIAGNOSIS — H25813 Combined forms of age-related cataract, bilateral: Secondary | ICD-10-CM | POA: Diagnosis not present

## 2021-08-09 DIAGNOSIS — H35412 Lattice degeneration of retina, left eye: Secondary | ICD-10-CM | POA: Diagnosis not present

## 2021-08-09 DIAGNOSIS — H4052X2 Glaucoma secondary to other eye disorders, left eye, moderate stage: Secondary | ICD-10-CM

## 2021-08-09 DIAGNOSIS — H209 Unspecified iridocyclitis: Secondary | ICD-10-CM | POA: Diagnosis not present

## 2021-08-09 DIAGNOSIS — H35033 Hypertensive retinopathy, bilateral: Secondary | ICD-10-CM | POA: Diagnosis not present

## 2021-08-09 DIAGNOSIS — I10 Essential (primary) hypertension: Secondary | ICD-10-CM | POA: Diagnosis not present

## 2021-08-09 DIAGNOSIS — H34831 Tributary (branch) retinal vein occlusion, right eye, with macular edema: Secondary | ICD-10-CM

## 2021-08-09 DIAGNOSIS — B0052 Herpesviral keratitis: Secondary | ICD-10-CM

## 2021-08-09 DIAGNOSIS — H1820 Unspecified corneal edema: Secondary | ICD-10-CM | POA: Diagnosis not present

## 2021-08-09 DIAGNOSIS — H33332 Multiple defects of retina without detachment, left eye: Secondary | ICD-10-CM

## 2021-08-09 DIAGNOSIS — H43813 Vitreous degeneration, bilateral: Secondary | ICD-10-CM | POA: Diagnosis not present

## 2021-08-11 ENCOUNTER — Emergency Department (HOSPITAL_COMMUNITY): Payer: Medicare Other

## 2021-08-11 ENCOUNTER — Inpatient Hospital Stay (HOSPITAL_COMMUNITY)
Admission: EM | Admit: 2021-08-11 | Discharge: 2021-08-16 | DRG: 091 | Disposition: A | Discharge status: Home / Self Care | Payer: Medicare Other | Attending: Internal Medicine | Admitting: Internal Medicine

## 2021-08-11 ENCOUNTER — Encounter (HOSPITAL_COMMUNITY): Payer: Self-pay | Admitting: Emergency Medicine

## 2021-08-11 ENCOUNTER — Encounter (INDEPENDENT_AMBULATORY_CARE_PROVIDER_SITE_OTHER): Payer: Medicare Other | Admitting: Ophthalmology

## 2021-08-11 DIAGNOSIS — Z992 Dependence on renal dialysis: Secondary | ICD-10-CM

## 2021-08-11 DIAGNOSIS — R0602 Shortness of breath: Secondary | ICD-10-CM | POA: Diagnosis not present

## 2021-08-11 DIAGNOSIS — I6782 Cerebral ischemia: Secondary | ICD-10-CM | POA: Diagnosis not present

## 2021-08-11 DIAGNOSIS — E785 Hyperlipidemia, unspecified: Secondary | ICD-10-CM | POA: Diagnosis present

## 2021-08-11 DIAGNOSIS — Z8546 Personal history of malignant neoplasm of prostate: Secondary | ICD-10-CM | POA: Diagnosis not present

## 2021-08-11 DIAGNOSIS — I132 Hypertensive heart and chronic kidney disease with heart failure and with stage 5 chronic kidney disease, or end stage renal disease: Secondary | ICD-10-CM | POA: Diagnosis present

## 2021-08-11 DIAGNOSIS — Z823 Family history of stroke: Secondary | ICD-10-CM

## 2021-08-11 DIAGNOSIS — D6959 Other secondary thrombocytopenia: Secondary | ICD-10-CM | POA: Diagnosis not present

## 2021-08-11 DIAGNOSIS — R4182 Altered mental status, unspecified: Secondary | ICD-10-CM | POA: Diagnosis not present

## 2021-08-11 DIAGNOSIS — E875 Hyperkalemia: Secondary | ICD-10-CM

## 2021-08-11 DIAGNOSIS — I272 Pulmonary hypertension, unspecified: Secondary | ICD-10-CM | POA: Diagnosis present

## 2021-08-11 DIAGNOSIS — B0052 Herpesviral keratitis: Secondary | ICD-10-CM

## 2021-08-11 DIAGNOSIS — G4733 Obstructive sleep apnea (adult) (pediatric): Secondary | ICD-10-CM | POA: Diagnosis present

## 2021-08-11 DIAGNOSIS — I351 Nonrheumatic aortic (valve) insufficiency: Secondary | ICD-10-CM | POA: Diagnosis not present

## 2021-08-11 DIAGNOSIS — I5022 Chronic systolic (congestive) heart failure: Secondary | ICD-10-CM | POA: Diagnosis not present

## 2021-08-11 DIAGNOSIS — Z7901 Long term (current) use of anticoagulants: Secondary | ICD-10-CM | POA: Diagnosis not present

## 2021-08-11 DIAGNOSIS — R5381 Other malaise: Secondary | ICD-10-CM | POA: Diagnosis present

## 2021-08-11 DIAGNOSIS — I10 Essential (primary) hypertension: Secondary | ICD-10-CM | POA: Diagnosis not present

## 2021-08-11 DIAGNOSIS — Z8249 Family history of ischemic heart disease and other diseases of the circulatory system: Secondary | ICD-10-CM | POA: Diagnosis not present

## 2021-08-11 DIAGNOSIS — I428 Other cardiomyopathies: Secondary | ICD-10-CM | POA: Diagnosis present

## 2021-08-11 DIAGNOSIS — Z20822 Contact with and (suspected) exposure to covid-19: Secondary | ICD-10-CM | POA: Diagnosis present

## 2021-08-11 DIAGNOSIS — N186 End stage renal disease: Secondary | ICD-10-CM | POA: Diagnosis present

## 2021-08-11 DIAGNOSIS — Z91158 Patient's noncompliance with renal dialysis for other reason: Secondary | ICD-10-CM

## 2021-08-11 DIAGNOSIS — I953 Hypotension of hemodialysis: Secondary | ICD-10-CM | POA: Diagnosis not present

## 2021-08-11 DIAGNOSIS — G459 Transient cerebral ischemic attack, unspecified: Secondary | ICD-10-CM | POA: Diagnosis not present

## 2021-08-11 DIAGNOSIS — G9341 Metabolic encephalopathy: Secondary | ICD-10-CM | POA: Diagnosis not present

## 2021-08-11 DIAGNOSIS — N250 Renal osteodystrophy: Secondary | ICD-10-CM | POA: Diagnosis not present

## 2021-08-11 DIAGNOSIS — N2581 Secondary hyperparathyroidism of renal origin: Secondary | ICD-10-CM | POA: Diagnosis present

## 2021-08-11 DIAGNOSIS — D638 Anemia in other chronic diseases classified elsewhere: Secondary | ICD-10-CM | POA: Diagnosis not present

## 2021-08-11 DIAGNOSIS — Z515 Encounter for palliative care: Secondary | ICD-10-CM | POA: Diagnosis not present

## 2021-08-11 DIAGNOSIS — Z83438 Family history of other disorder of lipoprotein metabolism and other lipidemia: Secondary | ICD-10-CM

## 2021-08-11 DIAGNOSIS — G928 Other toxic encephalopathy: Principal | ICD-10-CM | POA: Diagnosis present

## 2021-08-11 DIAGNOSIS — R296 Repeated falls: Secondary | ICD-10-CM | POA: Diagnosis present

## 2021-08-11 DIAGNOSIS — I4821 Permanent atrial fibrillation: Secondary | ICD-10-CM | POA: Diagnosis present

## 2021-08-11 DIAGNOSIS — T375X5A Adverse effect of antiviral drugs, initial encounter: Secondary | ICD-10-CM | POA: Diagnosis present

## 2021-08-11 DIAGNOSIS — G319 Degenerative disease of nervous system, unspecified: Secondary | ICD-10-CM | POA: Diagnosis not present

## 2021-08-11 DIAGNOSIS — Z79899 Other long term (current) drug therapy: Secondary | ICD-10-CM

## 2021-08-11 DIAGNOSIS — E8729 Other acidosis: Secondary | ICD-10-CM | POA: Diagnosis present

## 2021-08-11 DIAGNOSIS — Z7189 Other specified counseling: Secondary | ICD-10-CM | POA: Diagnosis not present

## 2021-08-11 DIAGNOSIS — I120 Hypertensive chronic kidney disease with stage 5 chronic kidney disease or end stage renal disease: Secondary | ICD-10-CM | POA: Diagnosis not present

## 2021-08-11 DIAGNOSIS — D631 Anemia in chronic kidney disease: Secondary | ICD-10-CM | POA: Diagnosis present

## 2021-08-11 DIAGNOSIS — I361 Nonrheumatic tricuspid (valve) insufficiency: Secondary | ICD-10-CM | POA: Diagnosis not present

## 2021-08-11 DIAGNOSIS — E162 Hypoglycemia, unspecified: Secondary | ICD-10-CM

## 2021-08-11 LAB — COMPREHENSIVE METABOLIC PANEL
ALT: 6 U/L (ref 0–44)
AST: 17 U/L (ref 15–41)
Albumin: 3 g/dL — ABNORMAL LOW (ref 3.5–5.0)
Alkaline Phosphatase: 26 U/L — ABNORMAL LOW (ref 38–126)
Anion gap: 10 (ref 5–15)
BUN: 44 mg/dL — ABNORMAL HIGH (ref 8–23)
CO2: 28 mmol/L (ref 22–32)
Calcium: 9.2 mg/dL (ref 8.9–10.3)
Chloride: 99 mmol/L (ref 98–111)
Creatinine, Ser: 13.89 mg/dL — ABNORMAL HIGH (ref 0.61–1.24)
GFR, Estimated: 4 mL/min — ABNORMAL LOW (ref >60)
Glucose, Bld: 65 mg/dL — ABNORMAL LOW (ref 70–99)
Potassium: 6.2 mmol/L — ABNORMAL HIGH (ref 3.5–5.1)
Sodium: 137 mmol/L (ref 135–145)
Total Bilirubin: 1.1 mg/dL (ref 0.3–1.2)
Total Protein: 5.9 g/dL — ABNORMAL LOW (ref 6.5–8.1)

## 2021-08-11 LAB — BLOOD GAS, ARTERIAL
Acid-Base Excess: 1.3 mmol/L (ref 0.0–2.0)
Bicarbonate: 27.6 mmol/L (ref 20.0–28.0)
Drawn by: 25770
O2 Saturation: 100 %
Patient temperature: 37
pCO2 arterial: 50 mmHg — ABNORMAL HIGH (ref 32–48)
pH, Arterial: 7.35 (ref 7.35–7.45)
pO2, Arterial: 111 mmHg — ABNORMAL HIGH (ref 83–108)

## 2021-08-11 LAB — IRON AND TIBC
Iron: 79 ug/dL (ref 45–182)
Saturation Ratios: 22 % (ref 17.9–39.5)
TIBC: 367 ug/dL (ref 250–450)
UIBC: 288 ug/dL

## 2021-08-11 LAB — CBC WITH DIFFERENTIAL/PLATELET
Abs Immature Granulocytes: 0.02 10*3/uL (ref 0.00–0.07)
Basophils Absolute: 0 10*3/uL (ref 0.0–0.1)
Basophils Relative: 1 %
Eosinophils Absolute: 0.1 10*3/uL (ref 0.0–0.5)
Eosinophils Relative: 3 %
HCT: 41.5 % (ref 39.0–52.0)
Hemoglobin: 12.6 g/dL — ABNORMAL LOW (ref 13.0–17.0)
Immature Granulocytes: 1 %
Lymphocytes Relative: 7 %
Lymphs Abs: 0.3 10*3/uL — ABNORMAL LOW (ref 0.7–4.0)
MCH: 27.5 pg (ref 26.0–34.0)
MCHC: 30.4 g/dL (ref 30.0–36.0)
MCV: 90.4 fL (ref 80.0–100.0)
Monocytes Absolute: 0.6 10*3/uL (ref 0.1–1.0)
Monocytes Relative: 14 %
Neutro Abs: 3.1 10*3/uL (ref 1.7–7.7)
Neutrophils Relative %: 74 %
Platelets: 108 10*3/uL — ABNORMAL LOW (ref 150–400)
RBC: 4.59 MIL/uL (ref 4.22–5.81)
RDW: 14.3 % (ref 11.5–15.5)
WBC: 4.2 10*3/uL (ref 4.0–10.5)
nRBC: 0 % (ref 0.0–0.2)

## 2021-08-11 LAB — RESP PANEL BY RT-PCR (FLU A&B, COVID) ARPGX2
Influenza A by PCR: NEGATIVE
Influenza B by PCR: NEGATIVE
SARS Coronavirus 2 by RT PCR: NEGATIVE

## 2021-08-11 LAB — TROPONIN I (HIGH SENSITIVITY)
Troponin I (High Sensitivity): 89 ng/L — ABNORMAL HIGH (ref <18)
Troponin I (High Sensitivity): 75 ng/L — ABNORMAL HIGH (ref <18)

## 2021-08-11 LAB — I-STAT CHEM 8, ED
BUN: 42 mg/dL — ABNORMAL HIGH (ref 8–23)
Calcium, Ion: 1.12 mmol/L — ABNORMAL LOW (ref 1.15–1.40)
Chloride: 99 mmol/L (ref 98–111)
Creatinine, Ser: 14.2 mg/dL — ABNORMAL HIGH (ref 0.61–1.24)
Glucose, Bld: 56 mg/dL — ABNORMAL LOW (ref 70–99)
HCT: 38 % — ABNORMAL LOW (ref 39.0–52.0)
Hemoglobin: 12.9 g/dL — ABNORMAL LOW (ref 13.0–17.0)
Potassium: 5.2 mmol/L — ABNORMAL HIGH (ref 3.5–5.1)
Sodium: 136 mmol/L (ref 135–145)
TCO2: 27 mmol/L (ref 22–32)

## 2021-08-11 LAB — CBG MONITORING, ED
Glucose-Capillary: 55 mg/dL — ABNORMAL LOW (ref 70–99)
Glucose-Capillary: 108 mg/dL — ABNORMAL HIGH (ref 70–99)
Glucose-Capillary: 94 mg/dL (ref 70–99)
Glucose-Capillary: 54 mg/dL — ABNORMAL LOW (ref 70–99)
Glucose-Capillary: 85 mg/dL (ref 70–99)

## 2021-08-11 LAB — AMMONIA: Ammonia: 20 umol/L (ref 9–35)

## 2021-08-11 LAB — BRAIN NATRIURETIC PEPTIDE: B Natriuretic Peptide: 2709.7 pg/mL — ABNORMAL HIGH (ref 0.0–100.0)

## 2021-08-11 IMAGING — CT CT HEAD W/O CM
3 series · 15 of 47 positions shown, 18 images · non-contrast
Comparison: CT head [DATE].

CLINICAL DATA: Transient ischemic attack (TIA)



[Series 2: head wo · axial · 0.45mm/px · z∈[-106,+34]mm · 9 of 34 slices shown, 12 images]
[im 3/34  brain]
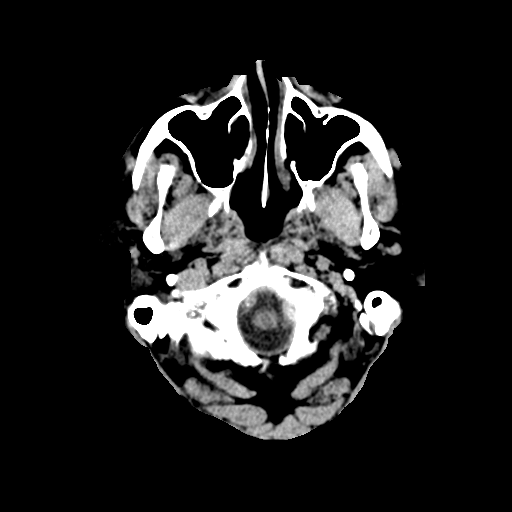
[im 3/34  bone]
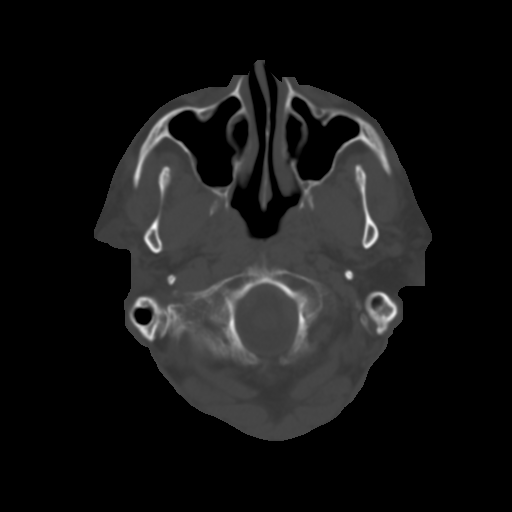
[im 6/34  brain]
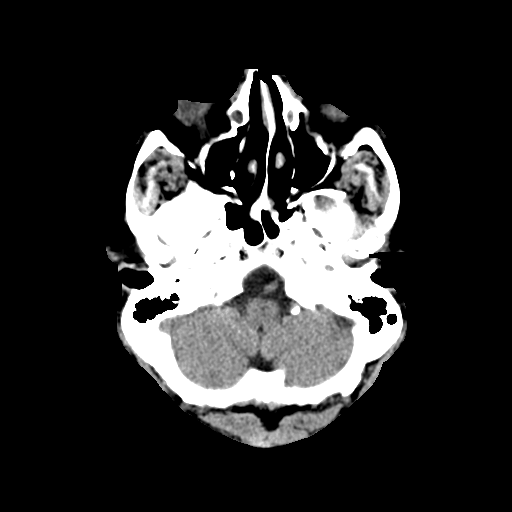
[im 10/34  brain]
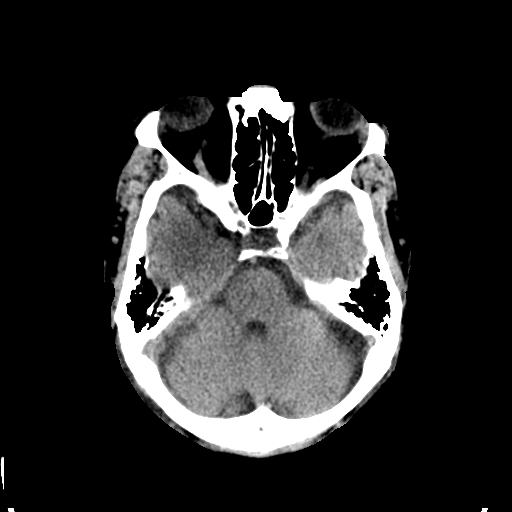
[im 13/34  brain]
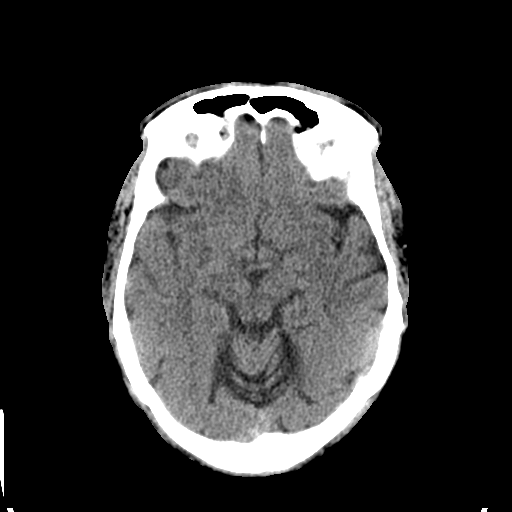
[im 18/34  brain]
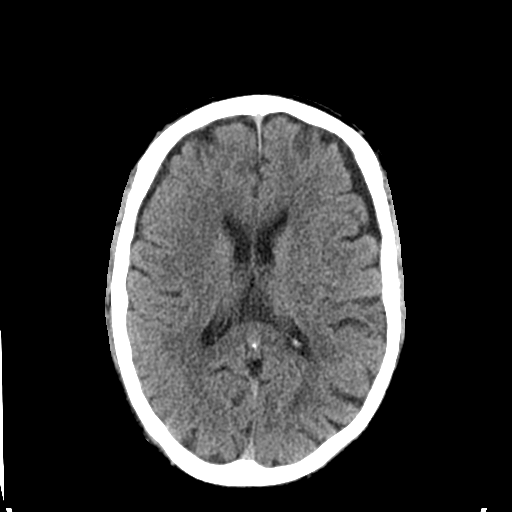
[im 18/34  bone]
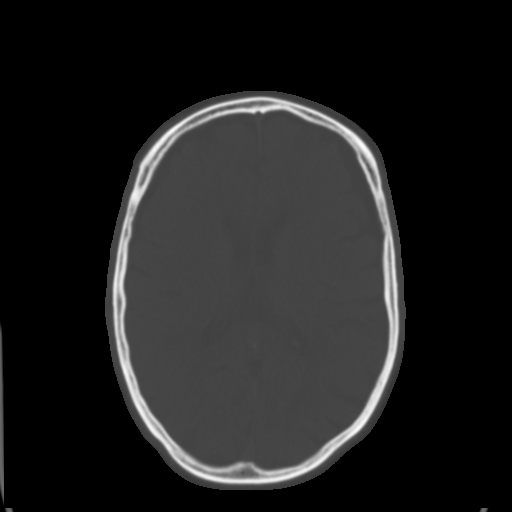
[im 21/34  brain]
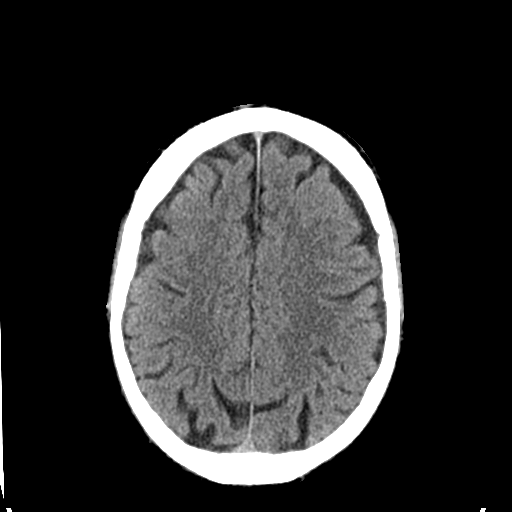
[im 24/34  brain]
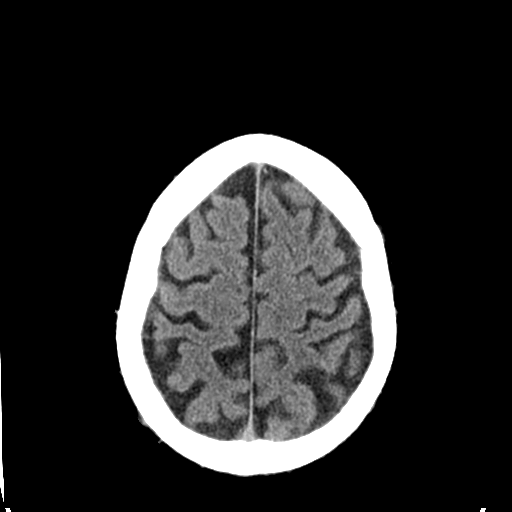
[im 28/34  brain]
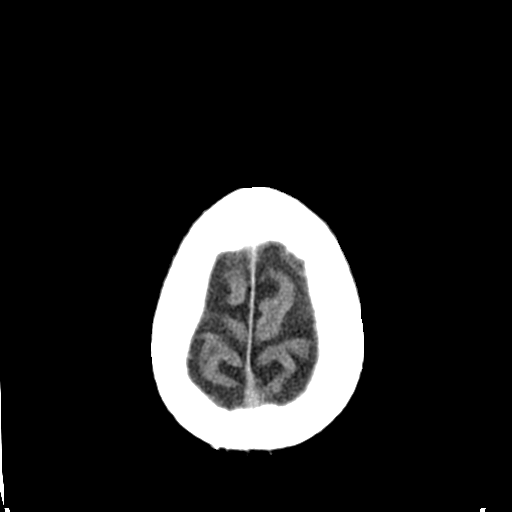
[im 31/34  brain]
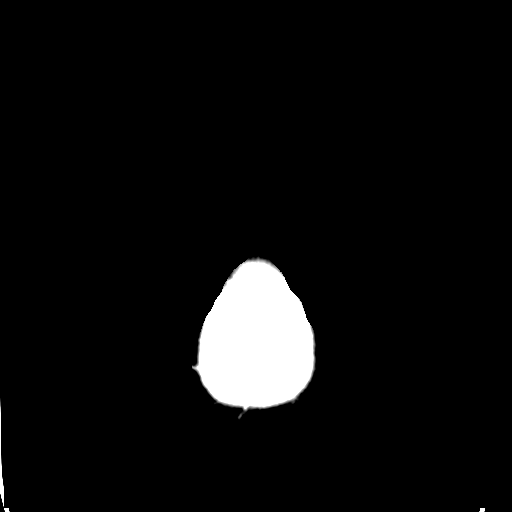
[im 31/34  bone]
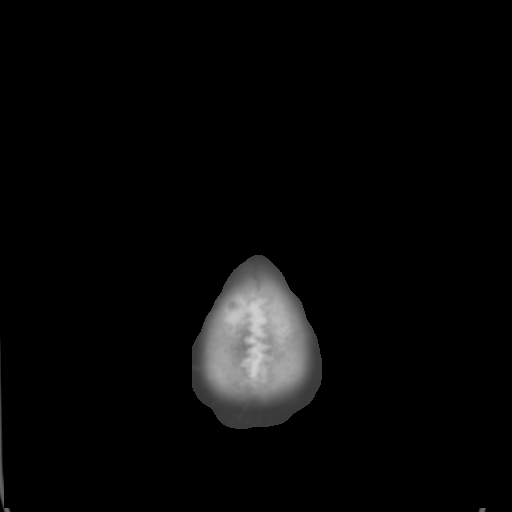

[Series 5: coronal soft tissue · coronal · 0.28mm/px · 3 of 66 slices shown]
[im 22/66  brain]
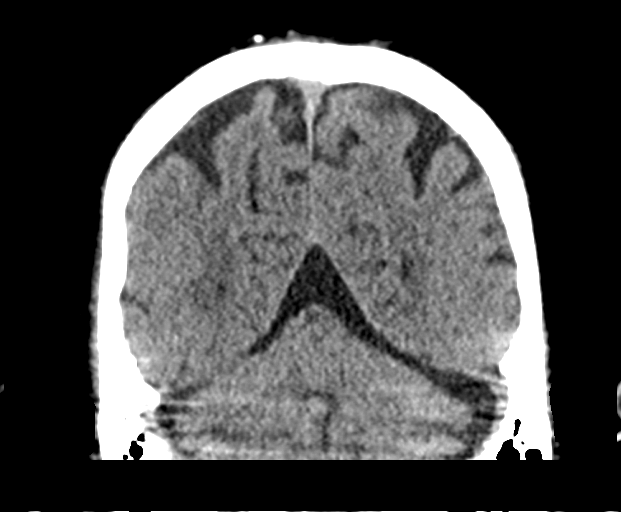
[im 29/66  brain]
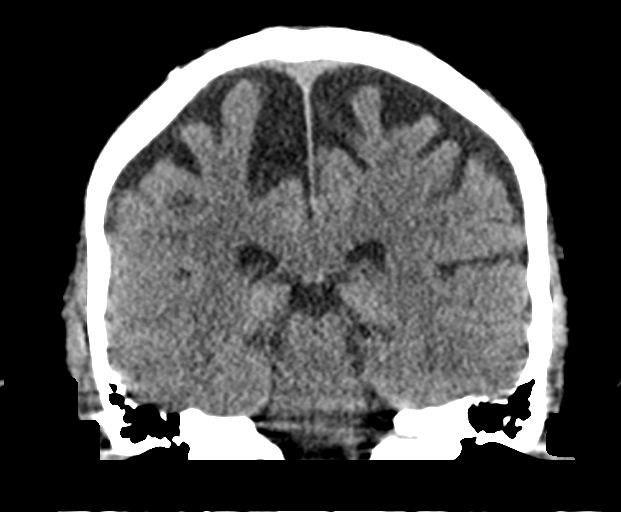
[im 37/66  brain]
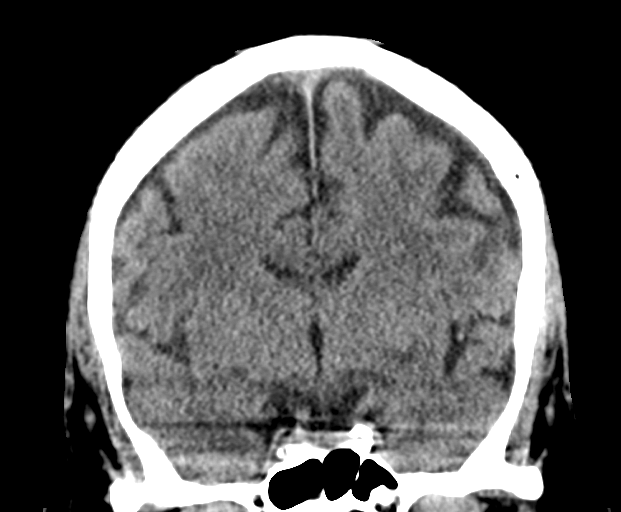

[Series 6: sagittal soft tissue · sagittal · 0.29mm/px · 3 of 59 slices shown]
[im 20/59  brain]
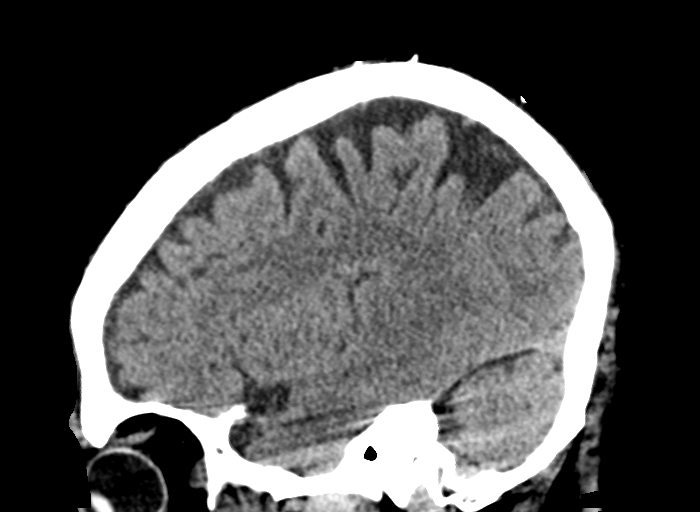
[im 30/59  brain]
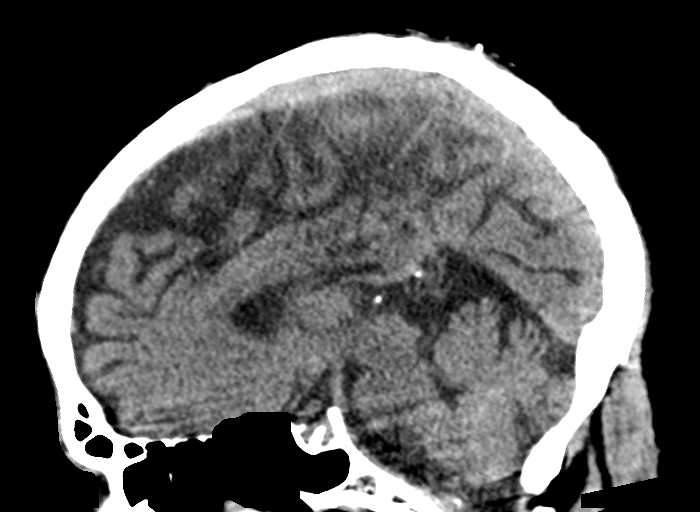
[im 39/59  brain]
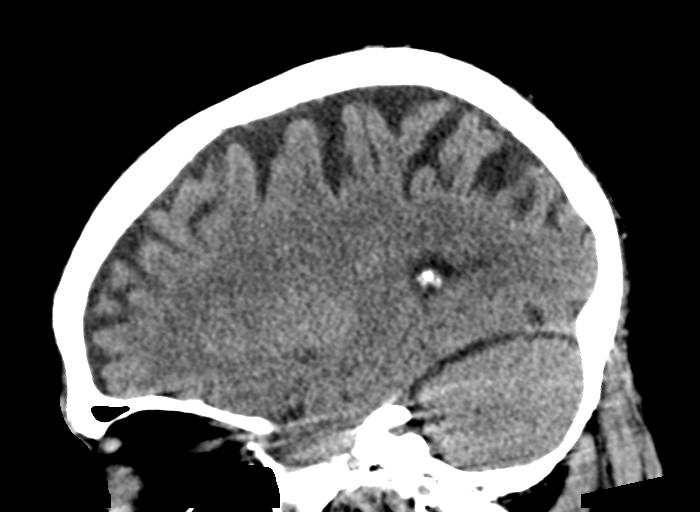

[15 of 47 positions shown; findings below may reference images not displayed]

FINDINGS: Brain: No evidence of acute infarction, hemorrhage, hydrocephalus,
extra-axial collection or mass lesion/mass effect. Similar atrophy
and chronic microvascular ischemic disease.

Vascular: No hyperdense vessel identified.

Skull: No acute fracture.

Sinuses/Orbits: Clear sinuses. No acute orbital findings.

Other: No mastoid effusions.
IMPRESSION: No evidence of acute intracranial abnormality.

## 2021-08-11 IMAGING — DX DG CHEST 1V PORT
2 series · 2 of 2 positions shown · non-contrast
Comparison: [DATE]

CLINICAL DATA: Short of breath.  ESRD on dialysis

EXAM:
PORTABLE CHEST 1 VIEW

[chest ap (1 of 2)]
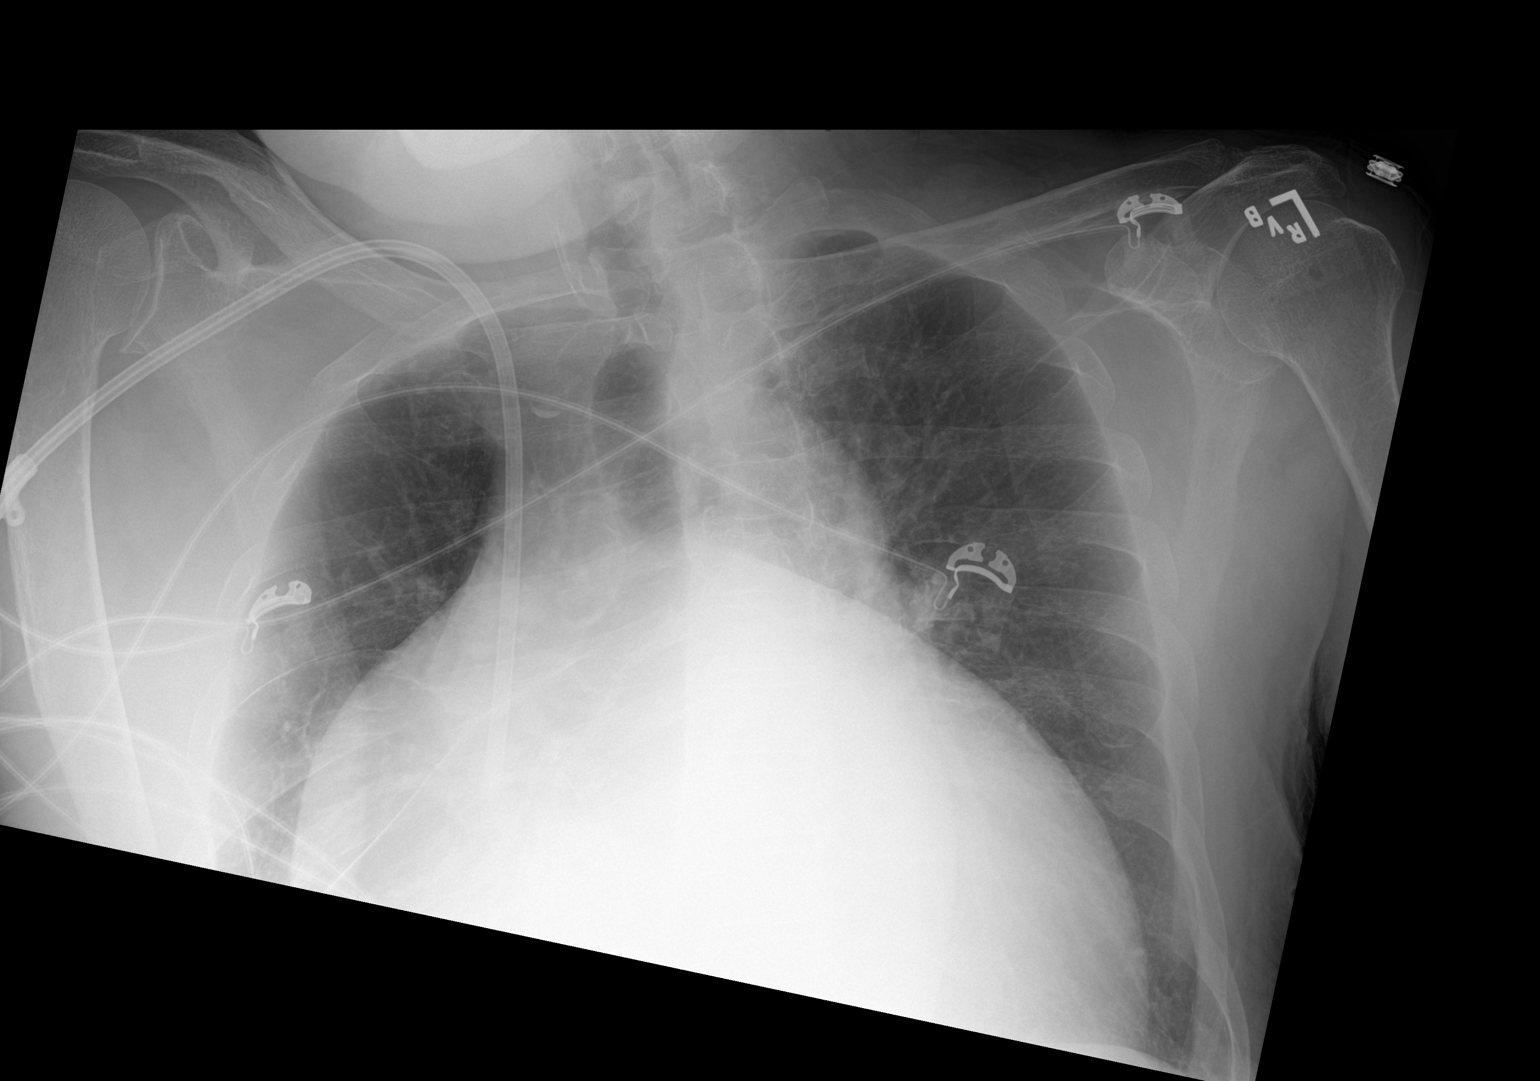

[chest ap (2 of 2)]
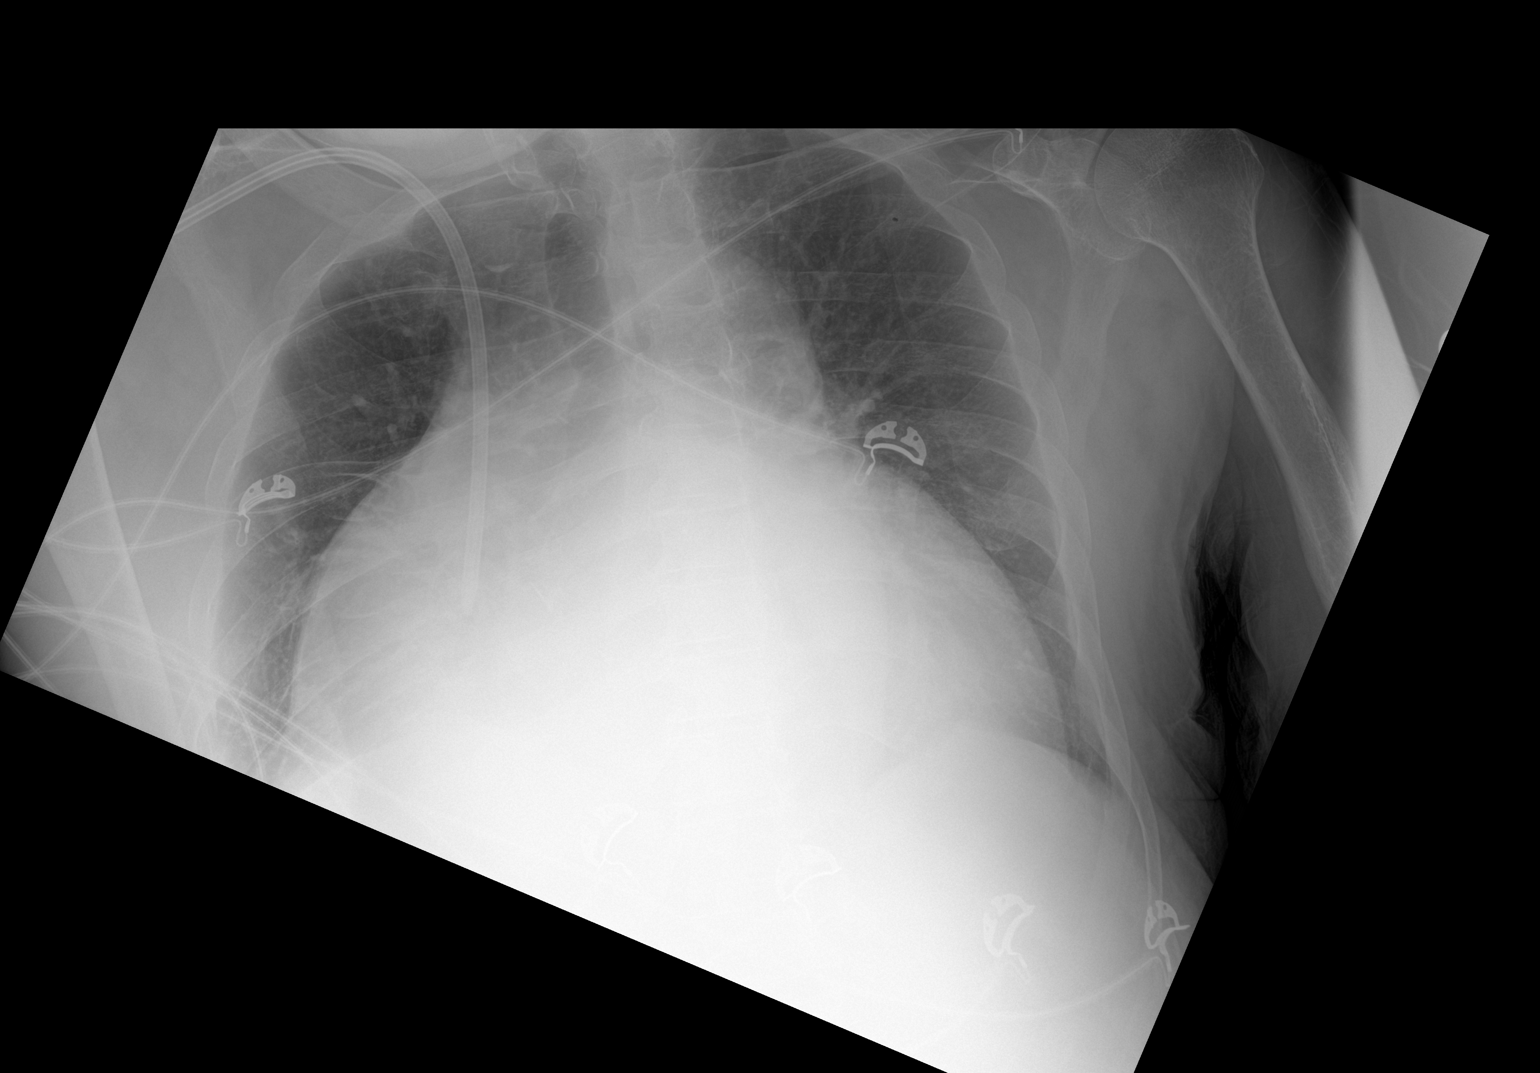

[2 of 2 positions shown; findings below may reference images not displayed]

FINDINGS: Marked cardiac enlargement which appears progressive. Possible
pericardial effusion versus projectional differences. Vascularity
normal. Negative for fluid overload or edema. No pleural effusion.
No focal infiltrate

Right jugular central venous catheter tip at the cavoatrial junction
unchanged.
IMPRESSION: Marked cardiac enlargement.  Question pericardial effusion.

Negative for edema or infiltrate.

## 2021-08-11 IMAGING — MR MR HEAD W/O CM
10 series · 43 of 48 positions shown · non-contrast
Comparison: CT head [DATE].  MRI head [DATE].

CLINICAL DATA: Transient ischemic attack (TIA)

EXAM:
MRI HEAD WITHOUT CONTRAST
TECHNIQUE: Multiplanar, multiecho pulse sequences of the brain and surrounding
structures were obtained without intravenous contrast.

[Series 5: dwi_tracew · axial · 3.0mm · 1.08mm/px · z∈[-63,+87]mm · 8 of 102 slices shown]
[im 1/102]
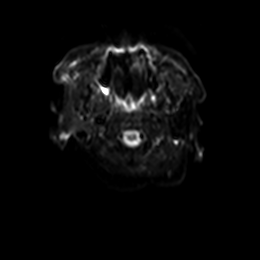
[im 19/102]
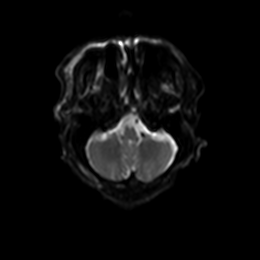
[im 28/102]
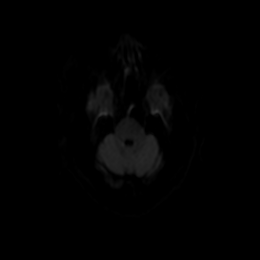
[im 46/102]
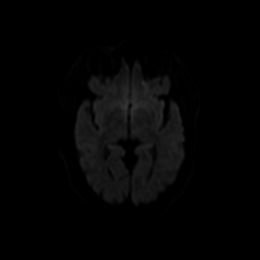
[im 56/102]
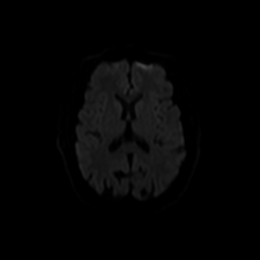
[im 74/102]
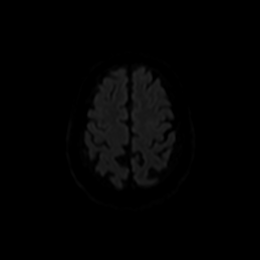
[im 83/102]
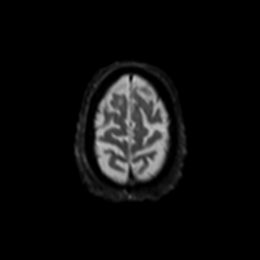
[im 102/102]
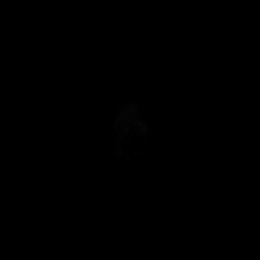

[Series 6: dwi_adc · axial · 3.0mm · 1.08mm/px · z∈[-63,+48]mm · 4 of 51 slices shown]
[im 1/51]
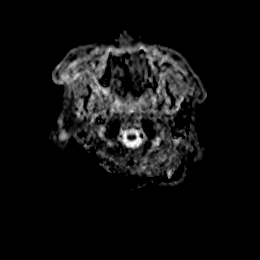
[im 13/51]
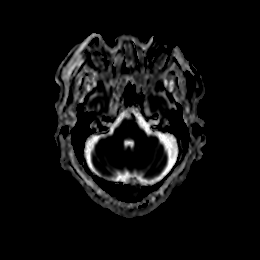
[im 26/51]
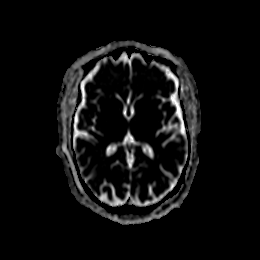
[im 38/51]
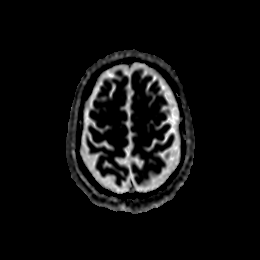

[Series 7: T2 · sagittal · 5.0mm · 0.47mm/px · 2 of 24 slices shown (1 of 3)]
[im 1/24]
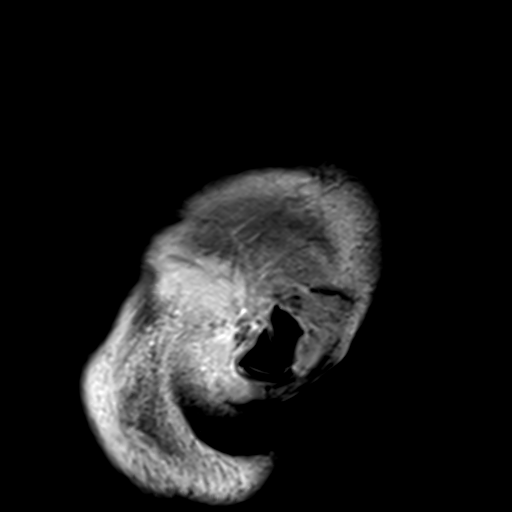
[im 24/24]
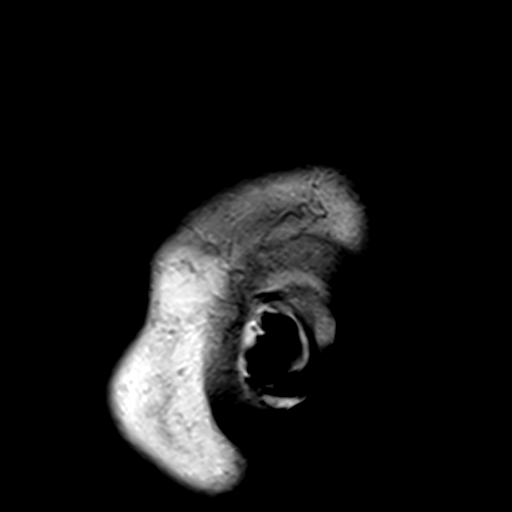

[Series 8: T2 · axial · 5.0mm · 0.45mm/px · z∈[-69,+93]mm · 3 of 26 slices shown (2 of 3)]
[im 1/26]
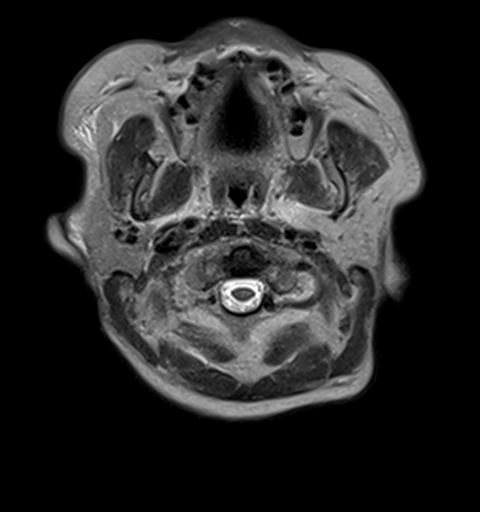
[im 13/26]
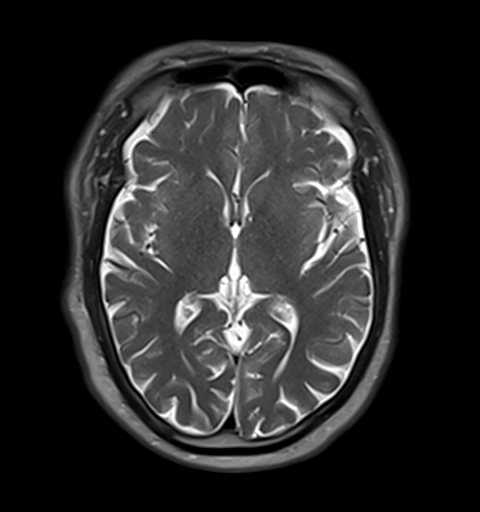
[im 26/26]
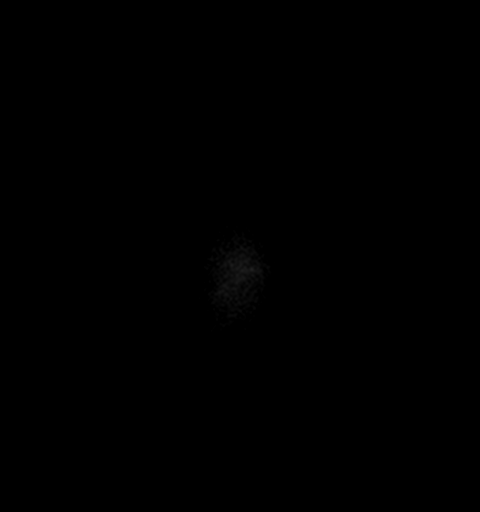

[Series 9: GRE · axial · 3.0mm · 0.45mm/px · z∈[-62,+88]mm · 5 of 51 slices shown]
[im 1/51]
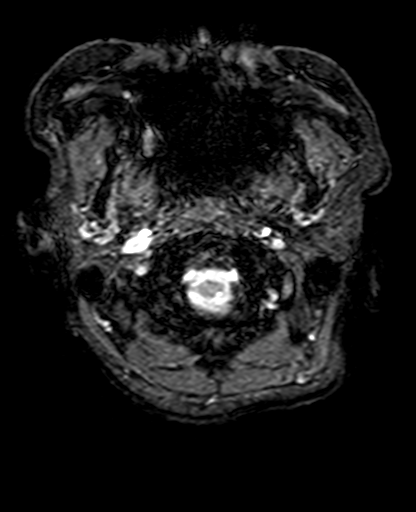
[im 13/51]
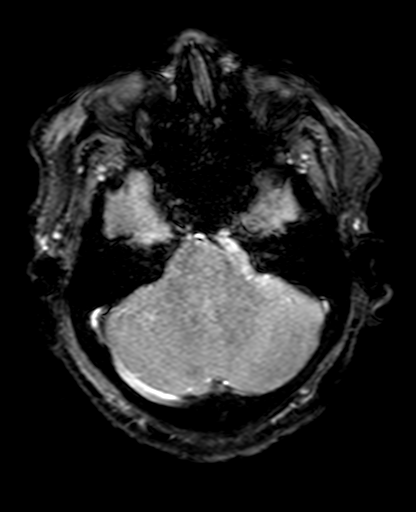
[im 26/51]
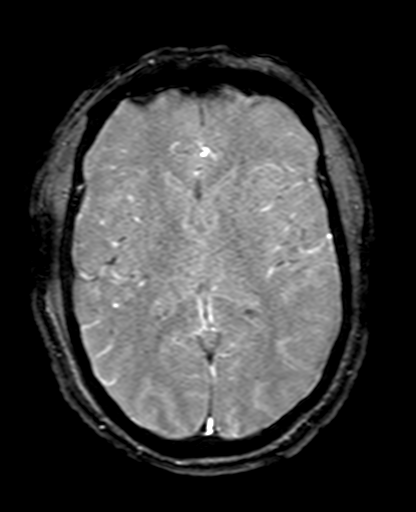
[im 38/51]
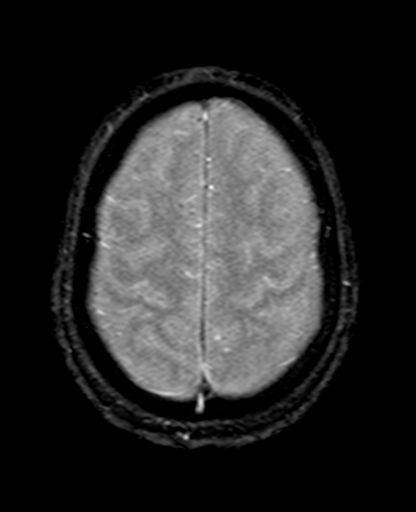
[im 51/51]
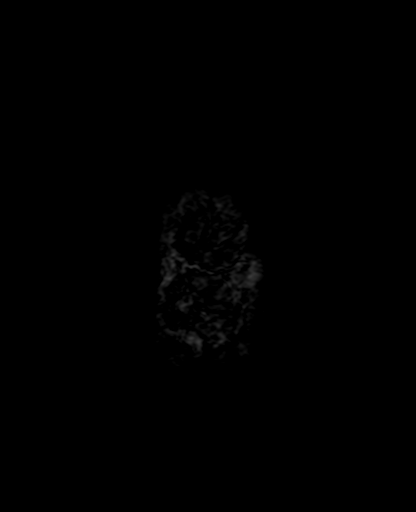

[Series 10: FLAIR · axial · 3.0mm · 0.94mm/px · z∈[-62,+88]mm · 5 of 51 slices shown]
[im 1/51]
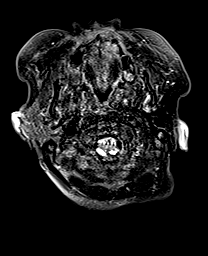
[im 13/51]
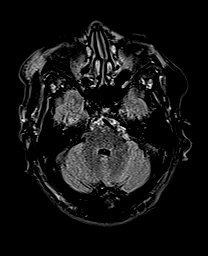
[im 26/51]
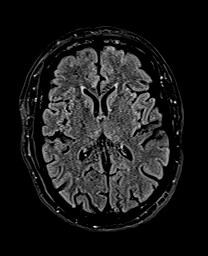
[im 38/51]
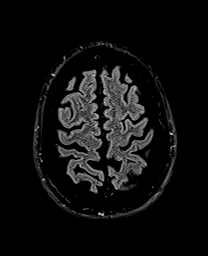
[im 51/51]
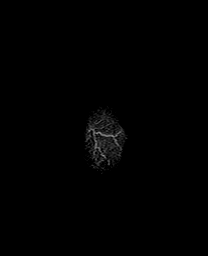

[Series 11: T1 · axial · 3.0mm · 0.45mm/px · z∈[-62,+88]mm · 5 of 51 slices shown]
[im 1/51]
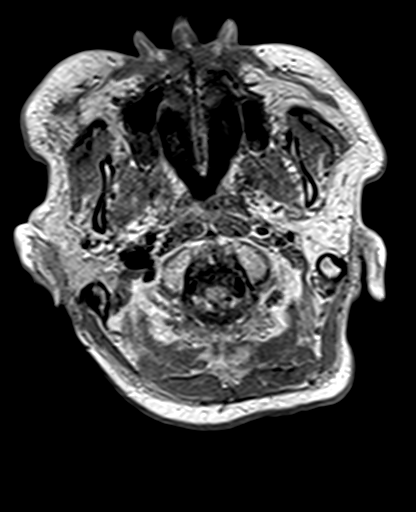
[im 13/51]
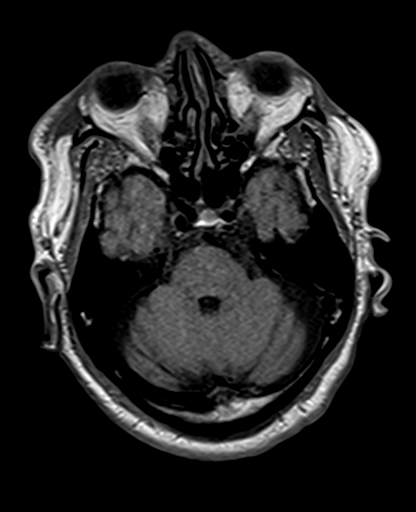
[im 26/51]
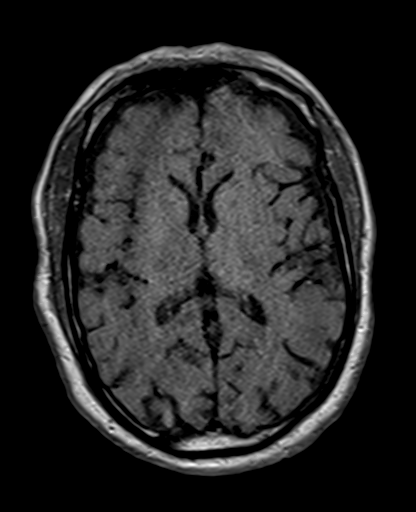
[im 38/51]
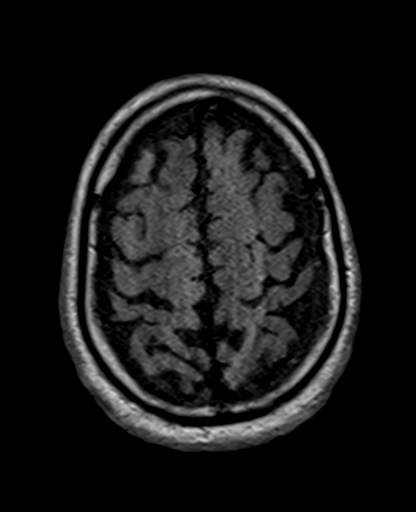
[im 51/51]
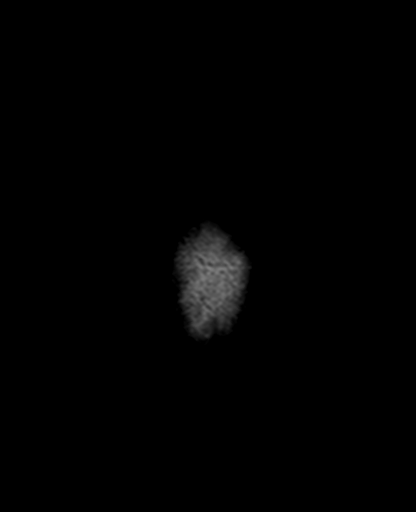

[Series 12: DWI · coronal · 5.0mm · 1.31mm/px · 6 of 56 slices shown (1 of 2)]
[im 1/56]
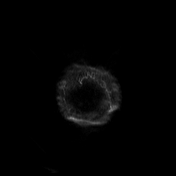
[im 12/56]
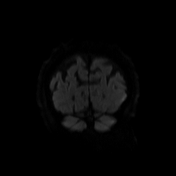
[im 23/56]
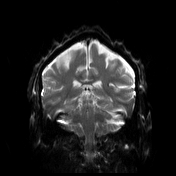
[im 34/56]
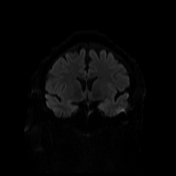
[im 45/56]
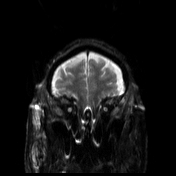
[im 56/56]
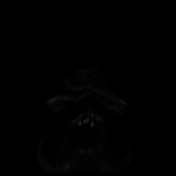

[Series 13: DWI · coronal · 5.0mm · 1.31mm/px · 3 of 28 slices shown (2 of 2)]
[im 1/28]
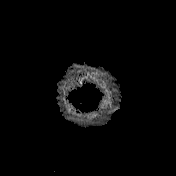
[im 14/28]
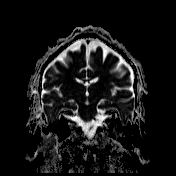
[im 28/28]
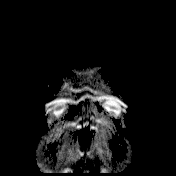

[Series 14: T2 · coronal · 5.0mm · 0.86mm/px · 2 of 24 slices shown (3 of 3)]
[im 1/24]
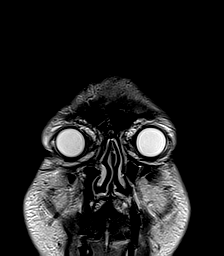
[im 24/24]
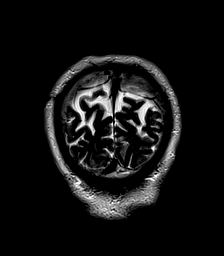

[43 of 48 positions shown; findings below may reference images not displayed]

FINDINGS: Brain: No acute infarction, hemorrhage, hydrocephalus, extra-axial
collection or mass lesion. Subjectively mild cerebral atrophy for
patient age.

Vascular: Major arterial flow voids are maintained the skull base.

Skull and upper cervical spine: Normal marrow signal.

Sinuses/Orbits: Largely clear sinuses.  No acute orbital findings

Other: No mastoid effusions.
IMPRESSION: No evidence of acute intracranial abnormality.

## 2021-08-11 MED ORDER — DEXTROSE-NACL 5-0.9 % IV SOLN: INTRAVENOUS | Status: DC

## 2021-08-11 MED ORDER — SODIUM CHLORIDE 0.9 % IV SOLN: 100.0000 mL | INTRAVENOUS | Status: DC | PRN

## 2021-08-11 MED ORDER — SODIUM ZIRCONIUM CYCLOSILICATE 10 G PO PACK
10.0000 g | PACK | Freq: Once | ORAL | Status: AC
  Administered 2021-08-11: 10 g via ORAL
  Filled 2021-08-11: qty 1

## 2021-08-11 MED ORDER — DEXTROSE 50 % IV SOLN
1.0000 | Freq: Once | INTRAVENOUS | Status: AC
  Administered 2021-08-11: 50 mL via INTRAVENOUS
  Filled 2021-08-11: qty 50

## 2021-08-11 MED ORDER — CHLORHEXIDINE GLUCONATE CLOTH 2 % EX PADS
6.0000 | MEDICATED_PAD | Freq: Every day | CUTANEOUS | Status: DC
  Administered 2021-08-13 – 2021-08-16 (×4): 6 via TOPICAL

## 2021-08-11 MED ORDER — LIDOCAINE HCL (PF) 1 % IJ SOLN: 5.0000 mL | INTRAMUSCULAR | Status: DC | PRN

## 2021-08-11 MED ORDER — ALTEPLASE 2 MG IJ SOLR: 2.0000 mg | Freq: Once | INTRAMUSCULAR | Status: DC | PRN

## 2021-08-11 MED ORDER — PENTAFLUOROPROP-TETRAFLUOROETH EX AERO: 1.0000 "application " | INHALATION_SPRAY | CUTANEOUS | Status: DC | PRN

## 2021-08-11 MED ORDER — HEPARIN SODIUM (PORCINE) 1000 UNIT/ML DIALYSIS
1000.0000 [IU] | INTRAMUSCULAR | Status: DC | PRN
  Administered 2021-08-12: 1000 [IU] via INTRAVENOUS_CENTRAL

## 2021-08-11 MED ORDER — LIDOCAINE-PRILOCAINE 2.5-2.5 % EX CREA: 1.0000 "application " | TOPICAL_CREAM | CUTANEOUS | Status: DC | PRN

## 2021-08-11 MED ORDER — HEPARIN SODIUM (PORCINE) 5000 UNIT/ML IJ SOLN
5000.0000 [IU] | Freq: Three times a day (TID) | INTRAMUSCULAR | Status: DC
  Administered 2021-08-11 – 2021-08-12 (×2): 5000 [IU] via SUBCUTANEOUS
  Filled 2021-08-11 (×2): qty 1

## 2021-08-11 NOTE — Consult Note (Signed)
Reason for Consult: To manage dialysis and dialysis related needs ?Referring Physician: Francia Greaves  ? ?Michael [name]. is an 66 y.o. male with past medical history significant for HTN-  seems severe, OSA, Atrial flutter on eliquis, nonischemic cardiomyopathy and ESRD-  NW on TTS but has not been compliant of late with HD-  history is obtained from family in the room as patient is slow to respond.  They report a lot of falling the last several weeks but over the last couple of days he really seems drugged-  slow to respond-  no new medications reported.  They have had difficulty obtaining labs so no results yet but I suspect only one HD treatment in 8 days is part of the issue.  He has a TDC in place -  exit site is non painful and there is no expressable purulence-  vital signs are stable -  BP if anything is low where it is usually high at the kidney center.  Brain imaging is not remarkable for any acute issue-  has a mild respiratory acidosis   ? ? ?Dialyzes at [facility] TTS but missed last Thurs and this Tuesday  EDW 86 ( but seems like it needs to be lower). 4 hours and 15 minutes-  profile #2 ?HD [place] 2/2, Dialyzer 180, Heparin no. Access [facility]-  refusing other. Calcitriol 2 mcg tiw-  last hgb 12.6 ? ?Past Medical History:  ?Diagnosis Date  ? Atrial arrhythmia   ? atrial tachycardia with variable AV conduction versus atypical aflutter 01/10/19, rate control (02/06/19)  ? Cataract   ? Dyspnea   ? on exertion  ? ESRD (end stage renal disease) ([place])   ? Hemo- MWF, Polycystic kidney disease  ? Fatigue   ? History of kidney stones   ? removal of stone- cysto  ? Hyperlipidemia   ? Hyperparathyroidism, secondary renal ([place])   ? Hypertension   ? Hypoxemia 12/12/2013  ? Nonischemic cardiomyopathy ([place])   ? Er 25% 2015, 55 % 2013  ? OSA on CPAP   ? no longer using cpap  ? OSA on CPAP 03/24/2014  ? Pneumonia   ? 2015ish  ? Prostate cancer ([place])   ? Ventricular tachycardia//Freq PVCs   ? Wears glasses   ? ? ?Past Surgical  History:  ?Procedure Laterality Date  ? A-FLUTTER ABLATION N/A 04/11/2019  ? Procedure: A-FLUTTER ABLATION;  Surgeon: Evans Lance, MD;  Location: [place] [facility];  Service: Cardiovascular;  Laterality: N/A;  ? A/V FISTULAGRAM Left 04/27/2017  ? Procedure: A/V FISTULAGRAM;  Surgeon: Conrad Summerton, MD;  Location: [place] [facility];  Service: Cardiovascular;  Laterality: Left;  lt arm  ? A/V FISTULAGRAM Left 01/10/2019  ? Procedure: A/V FISTULAGRAM;  Surgeon: Marty Heck, MD;  Location: [place] [facility];  Service: Cardiovascular;  Laterality: Left;  ? APPENDECTOMY    ? AV FISTULA PLACEMENT  12/05/2011  ? Procedure: ARTERIOVENOUS (AV) FISTULA CREATION;LLEFT ARM  Surgeon: Conrad [place], MD;  Location: [place];  Service: Vascular;  Laterality: Left;  RADIO-CEPHALIC  fistula left arm  ? AV FISTULA PLACEMENT  01/11/2012  ? Procedure: ARTERIOVENOUS (AV) FISTULA CREATION;  Surgeon: Conrad [place], MD;  Location: [place];  Service: Vascular;  Laterality: Left;  Creation of left brachial cephalic arteriovenous fistula  ? AV FISTULA PLACEMENT Right 03/07/2019  ? Procedure: ARTERIOVENOUS (AV) FISTULA CREATION  RIGHT ARM;  Surgeon: Marty Heck, MD;  Location: [place];  Service: Vascular;  Laterality: Right;  ?  BASCILIC VEIN TRANSPOSITION Left 12/27/2016  ? Procedure: BASILIC VEIN TRANSPOSITION LEFT UPPER ARM FIRST STAGE;  Surgeon: Conrad [place], MD;  Location: [place];  Service: Vascular;  Laterality: Left;  ? BASCILIC VEIN TRANSPOSITION Left 01/31/2017  ? Procedure: LEFT ARM BASILIC VEIN TRANSPOSITION, SECOND STAGE;  Surgeon: Conrad [place], MD;  Location: [place];  Service: Vascular;  Laterality: Left;  ? CARDIAC CATHETERIZATION  04-05-2010  ? checking for blockage but none-[facility]  ? COLONOSCOPY    ? CYSTOSCOPY W/ STONE MANIPULATION    ? "laser once" (01/22/2013)  ? HEMATOMA EVACUATION Left 05/09/2017  ? Procedure: EVACUATION HEMATOMA LEFT ARM;  Surgeon: Conrad [place], MD;  Location: [place];  Service: Vascular;  Laterality:  Left;  ? HERNIA REPAIR    ? INGUINAL HERNIA REPAIR Right 11/06/2015  ? Procedure: OPEN HERNIA REPAIR  RIGHT INGUINAL ADULT;  Surgeon: [name], MD;  Location: [facility];  Service: General;  Laterality: Right;  with MESH  ? INSERTION OF DIALYSIS CATHETER Right 10/05/2016  ? Procedure: INSERTION OF right internal jugular DIALYSIS CATHETER;  Surgeon: [name], MD;  Location: [place];  Service: Vascular;  Laterality: Right;  ? INSERTION OF MESH  03/20/2012  ? Procedure: INSERTION OF MESH;  UMB Surgeon: [name], MD;  Location: [place];  Service: General;  Laterality: N/A;  ? INSERTION OF MESH N/A 01/22/2013  ? Procedure: INSERTION OF MESH;  Surgeon: [name], MD;  Location: [place];  Service: General;  Laterality: N/A;  ? LAPAROTOMY  04/02/2012  ? Procedure: EXPLORATORY LAPAROTOMY;  Surgeon: [name], MD;  Location: [place];  Service: General;  Laterality: N/A;  Exploratory Laparotomy with resection of small intestine  ? LEFT HEART CATHETERIZATION WITH CORONARY ANGIOGRAM N/A 05/14/2013  ? Procedure: LEFT HEART CATHETERIZATION WITH CORONARY ANGIOGRAM;  Surgeon: Sinclair Grooms, MD;  Location: [facility] CATH LAB;  Service: Cardiovascular;  Laterality: N/A;  ? LIGATION OF ARTERIOVENOUS  FISTULA Left 12/27/2016  ? Procedure: LIGATION/EXCISION OF LEFT UPPER ARM ARTERIOVENOUS  FISTULA;  EVACUATION OF HEMATOMA;  Surgeon: Conrad [place], MD;  Location: [place];  Service: Vascular;  Laterality: Left;  ? LIGATION OF ARTERIOVENOUS  FISTULA Left 03/07/2019  ? Procedure: LIGATION OF ARTERIOVENOUS FISTULA  LEFT UPPER ARM;  Surgeon: Marty Heck, MD;  Location: [place];  Service: Vascular;  Laterality: Left;  ? REVISON OF ARTERIOVENOUS FISTULA Left 10/05/2016  ? Procedure: REVISON OF left arm ARTERIOVENOUS FISTULA;  Surgeon: [name], MD;  Location: [facility];  Service: Vascular;  Laterality: Left;  ? TONSILLECTOMY    ? UMBILICAL HERNIA REPAIR  03/20/2012  ? Procedure: HERNIA REPAIR UMBILICAL ADULT;  Surgeon: [name], MD;  Location: [place];  Service: General;  Laterality: N/A;  ? UMBILICAL HERNIA REPAIR  01/22/2013  ? preperitoneal open procedure due to significant adhesions/notes 01/22/2013  ? VENTRAL HERNIA REPAIR N/A 01/22/2013  ? Procedure: ATTEMPTED LAPAROSCOPIC VENTRAL HERNIA CONVERTED TO OPEN;  Surgeon: [name], MD;  Location: [place];  Service: General;  Laterality: N/A;  ? ? ?Family History  ?Problem Relation Age of Onset  ? Heart disease Mother   ? Hyperlipidemia Mother   ? Hypertension Mother   ? Kidney disease Father   ? Stroke Father   ? Kidney disease Brother   ? Amblyopia Neg Hx   ? Blindness Neg Hx   ? Cataracts Neg Hx   ? Diabetes Neg Hx   ? Glaucoma Neg Hx   ? Macular degeneration Neg  Hx   ? Retinal detachment Neg Hx   ? Strabismus Neg Hx   ? Retinitis pigmentosa Neg Hx   ? Breast cancer Neg Hx   ? Prostate cancer Neg Hx   ? Colon cancer Neg Hx   ? Pancreatic cancer Neg Hx   ? ? ?Social History:  reports that he has never smoked. He has never used smokeless tobacco. He reports that he does not drink alcohol and does not use drugs. ? ?Allergies:  ?Allergies  ?Allergen Reactions  ? Indapamide Other (See Comments)  ? ? ?Medications: I have reviewed the patient's current medications. ? ?Results for orders placed or performed during the hospital encounter of 08/11/21 (from the past 48 hour(s))  ?Blood gas, arterial     Status: Abnormal  ? Collection Time: 08/11/21 11:10 AM  ?Result Value Ref Range  ? O2 Content ROOM AIR L/min  ? pH, Arterial 7.35 7.35 - 7.45  ? pCO2 arterial 50 (H) 32 - 48 mmHg  ? pO2, Arterial 111 (H) 83 - 108 mmHg  ? Bicarbonate 27.6 20.0 - 28.0 mmol/L  ? Acid-Base Excess 1.3 0.0 - 2.0 mmol/L  ? O2 Saturation 100 %  ? Patient temperature 37.0   ? Collection site RIGHT RADIAL   ? Drawn by [id number]   ? Allens test (pass/fail) PASS PASS  ?  Comment: Performed at [facility], [place] [address] [place], [place] [address]  ? ? ?CT Head Wo Contrast ? ?Result Date:  08/11/2021 ?CLINICAL DATA:  Transient ischemic attack (TIA) EXAM: CT HEAD WITHOUT CONTRAST TECHNIQUE: Contiguous axial images were obtained from the base of the skull through the vertex without intravenous contrast. RADIAT

## 2021-08-11 NOTE — ED Triage Notes (Signed)
Per wife-states he fell and hit his head back in January-states since then he has had increase falls-states he has had increase in falls that started 3 days ago-off balance-has been seeing opthalmologist for increased pressure behind left eye-wife states he has been sleeping a lot-has not had dialysis since this past Saturday  ?

## 2021-08-11 NOTE — Progress Notes (Signed)
Arrived to patient's room per consult request for PIV placement. Patient currently not in the room. OTF for tests. Secure chat sent to nurse notified nurse, Jarrett Soho RN to reconsult when patient is back in his room and ready for PIV placement. Fran Lowes, RN VAST ?

## 2021-08-11 NOTE — ED Provider Notes (Signed)
?[facility] DEPT ?Provider Note ? ? ?CSN: [id number] ?Arrival date & time: 08/11/21  0931 ? ?  ? ?History ? ?Chief Complaint  ?Patient presents with  ? Fall  ? ? ?Michael Schoch. is a 66 y.o. male. ? ?66 year old male with prior medical history as detailed below presents for evaluation.  He is accompanied by his wife.  32 of history obtained from the patient's wife.  Patient with longstanding history of ESRD currently on dialysis.  He missed dialysis yesterday.  He is due for dialysis on Thursday. ? ?Patient's wife reports that the patient has been more somnolent than his normal for the last 2 to 3 days.  Patient with unsteadiness when ambulating for the last 3 to 4 days.  Patient without recent head injury per report.  Patient without reported fever.  Patient is slow to respond per the wife.  He sometimes will answer questions incorrectly.  This is not his normal. ? ?Patient without complaint at time of evaluation.  He answered questions slowly.  He appears to be having some difficulty processing direct instructions. ? ?The history is provided by the patient and medical records.  ?Illness ?Location:  Confusion, slowness to respond, mild disorientation, unsteady gait ?Severity:  Moderate ?Onset quality:  Gradual ?Duration:  4 days ?Timing:  Constant ?Progression:  Worsening ?Chronicity:  New ? ?  ? ?Home Medications ?Prior to Admission medications   ?Medication Sig Start Date End Date Taking? Authorizing Provider  ?acetaZOLAMIDE (DIAMOX) 250 MG tablet Take 1 tablet (250 mg total) by mouth in the morning and at bedtime. 08/06/21   [name], MD  ?acyclovir (ZOVIRAX) 400 MG tablet Take 400 mg by mouth 2 (two) times daily.    [provider]  ?apixaban (ELIQUIS) 5 MG TABS tablet Take 1 tablet by mouth twice daily 05/31/21   Early Osmond, MD  ?atropine 1 % ophthalmic solution Place 1 drop into the left eye in the morning and at bedtime.    [provider]   ?brimonidine (ALPHAGAN) 0.2 % ophthalmic solution Place 1 drop into the left eye 3 (three) times daily. 07/22/21   [name], MD  ?cinacalcet (SENSIPAR) 30 MG tablet Take 30 mg by mouth 3 (three) times a week. 10/05/20   [provider]  ?clotrimazole-betamethasone (LOTRISONE) cream Apply 1 application topically 2 (two) times daily. 12/28/20   Edrick Kins, DPM  ?dorzolamide-timolol (COSOPT) 22.3-6.8 MG/ML ophthalmic solution Place 1 drop into the left eye 3 (three) times daily. 07/22/21   [name], MD  ?losartan (COZAAR) 25 MG tablet Take 0.5 tablets (12.5 mg total) by mouth daily. 07/07/21   Early Osmond, MD  ?Loteprednol Etabonate (LOTEMAX SM) 0.38 % GEL Place 1 drop into the left eye every 2 (two) hours. 07/26/21   [name], MD  ?metoprolol succinate (TOPROL XL) 25 MG 24 hr tablet Take 1 tablet (25 mg total) by mouth at bedtime. 04/01/21   Early Osmond, MD  ?nitroGLYCERIN (NITROSTAT) 0.4 MG SL tablet Place 1 tablet (0.4 mg total) under the tongue every 5 (five) minutes x 3 doses as needed for chest pain. 05/19/13   Lendon Colonel, NP  ?rosuvastatin (CRESTOR) 10 MG tablet Take 1 tablet (10 mg total) by mouth daily. 04/22/21   Early Osmond, MD  ?sevelamer carbonate (RENVELA) 800 MG tablet Take 800 mg by mouth 3 (three) times daily with meals.    [provider]  ?sodium chloride (MURO 128) 2 % ophthalmic solution  Place 1 drop into the left eye 4 (four) times daily. 07/26/21   [name], MD  ?   ? ?Allergies    ?Indapamide   ? ?Review of Systems   ?Review of Systems  ?All other systems reviewed and are negative. ? ?Physical Exam ?Updated Vital Signs ?BP 123/79 (BP Location: Right Wrist)   Pulse 86   Temp 98.2 ?F (36.8 ?C) (Oral)   Resp (!) 23   Ht '5\' 7"'$  (1.702 m)   Wt 89.4 kg   SpO2 98%   BMI 30.85 kg/m?  ?Physical Exam ?Vitals and nursing note reviewed.  ?Constitutional:   ?   General: He is not in acute distress. ?   Appearance: Normal appearance. He is  well-developed.  ?HENT:  ?   Head: Normocephalic and atraumatic.  ?Eyes:  ?   Conjunctiva/sclera: Conjunctivae normal.  ?   Pupils: Pupils are equal, round, and reactive to light.  ?Cardiovascular:  ?   Rate and Rhythm: Normal rate and regular rhythm.  ?   Heart sounds: Normal heart sounds.  ?Pulmonary:  ?   Effort: Pulmonary effort is normal. No respiratory distress.  ?   Breath sounds: Normal breath sounds.  ?Abdominal:  ?   General: There is no distension.  ?   Palpations: Abdomen is soft.  ?   Tenderness: There is no abdominal tenderness.  ?Musculoskeletal:     ?   General: No deformity. Normal range of motion.  ?   Cervical back: Normal range of motion and neck supple.  ?Skin: ?   General: Skin is warm and dry.  ?Neurological:  ?   General: No focal deficit present.  ?   Mental Status: He is alert.  ?   Comments: Alert, slow to respond, appears to be having difficulty processing direct instructions ? ?No facial droop ? ?Normal speech ? ?LVO screen is negative ?  ? ? ?ED Results / Procedures / Treatments   ?Labs ?(all labs ordered are listed, but only abnormal results are displayed) ?Labs Reviewed  ?RESP PANEL BY RT-PCR (FLU A&B, COVID) [id number]  ?CBC WITH DIFFERENTIAL/PLATELET  ?COMPREHENSIVE METABOLIC PANEL  ?URINALYSIS, ROUTINE W REFLEX MICROSCOPIC  ?AMMONIA  ?RAPID URINE DRUG SCREEN, HOSP PERFORMED  ?BLOOD GAS, ARTERIAL  ?BRAIN NATRIURETIC PEPTIDE  ?I-STAT CHEM 8, ED  ?TROPONIN I (HIGH SENSITIVITY)  ? ? ?EKG ?EKG Interpretation ? ?Date/Time:  Wednesday August 11 2021 10:22:59 EDT ?Ventricular Rate:  86 ?PR Interval:    ?QRS Duration: 144 ?QT Interval:  415 ?QTC Calculation: 497 ?R Axis:   258 ?Text Interpretation: Atrial fibrillation Right bundle branch block Inferior infarct, old Anterior infarct, old Confirmed by Dene Gentry [id number]) on 08/11/2021 10:31:36 AM ? ?Radiology ?CT Head Wo Contrast ? ?Result Date: 08/11/2021 ?CLINICAL DATA:  Transient ischemic attack (TIA) EXAM: CT HEAD WITHOUT CONTRAST TECHNIQUE:  Contiguous axial images were obtained from the base of the skull through the vertex without intravenous contrast. RADIATION DOSE REDUCTION: This exam was performed according to the departmental dose-optimization program which includes automated exposure control, adjustment of the mA and/or kV according to patient size and/or use of iterative reconstruction technique. COMPARISON:  CT head August 19, 2020. FINDINGS: Brain: No evidence of acute infarction, hemorrhage, hydrocephalus, extra-axial collection or mass lesion/mass effect. Similar atrophy and chronic microvascular ischemic disease. Vascular: No hyperdense vessel identified. Skull: No acute fracture. Sinuses/Orbits: Clear sinuses. No acute orbital findings. Other: No mastoid effusions. IMPRESSION: No evidence of acute intracranial abnormality. Electronically Signed   By: Roslynn Amble  Ronnald Ramp M.D.   On: 08/11/2021 10:11   ? ?Procedures ?Procedures  ? ? ?Medications Ordered in ED ?Medications - No data to display ? ?ED Course/ Medical Decision Making/ A&P ?  ?                        ?Medical Decision Making ?Amount and/or Complexity of Data Reviewed ?Labs: ordered. ?Radiology: ordered. ? ?Risk ?Prescription drug management. ?Decision regarding hospitalization. ? ? ? ?Medical Screen Complete ? ?This patient presented to the ED with complaint of AMS. ? ?This complaint involves an extensive number of treatment options. The initial differential diagnosis includes, but is not limited to, missed ESRD, metabolic abnormality, CVA, other intracranial pathology, etc. ? ?This presentation is: Acute, Chronic, Self-Limited, Previously Undiagnosed, Uncertain Prognosis, Complicated, Systemic Symptoms, and Threat to Life/Bodily Function ? ?Patient with longstanding history of ESRD.  Patient's family reports that he has had gradual decline in his mentation over the last several days.  Patient also with unsteady gait reportedly. ? ?Patient is able to answer questions although his  mentation is slow. ? ?There was initial difficulty obtaining IV/labs. ? ?Imaging including CT head and MRI brain are without acute pathology. ? ?Patient has been seen by nephrology.  Patient's presentation is concerning gi

## 2021-08-11 NOTE — ED Notes (Signed)
Pt was sleeping at the during getting vitals done on him and the result of his vital were taken at 1142 was as follows: ?BP:106/47 ?Pulse: 85 ?Heart Rate:83 ?MAP: 58 ?Resp:16 ?O2 Levels:99 ?Also I have Notify the Nurse to get permission to validate. ? ?

## 2021-08-11 NOTE — ED Notes (Signed)
Attempted [place] IV stick in RFA per visitor request, unsuccessfully. Will place IV team consult.  ?

## 2021-08-11 NOTE — H&P (Signed)
?History and Physical  ? ? ?Patient: Michael [name]. LFY:[id number] DOB: 1956-03-12 ?DOA: 08/11/2021 ?DOS: the patient was seen and examined on 08/11/2021 ?PCP: [name], MD  ?Patient coming from: Home ? ?Chief Complaint:  ?Chief Complaint  ?Patient presents with  ? Fall  ? ?HPI: Michael Nay. is a 66 y.o. male with medical history significant of ESRD on HD, HTN, chronic HFrEF, HLD. Presenting with confusion. History from wife. She reports that the patient has been a weak over the last few weeks. He seems more "wobbly" and has had several falls. She reports that it has progressed over the past 3 days where he has become more somnolent and confused. He has not had a good appetite. She reports that he has missed some dialysis session. She reports starting a new medicine with his eye doctor during this time, but she can not tell me what it was. When his symptoms didn't improve today, she decided to bring him to the hospital for evaluation.   ? ?Review of Systems: unable to review all systems due to the inability of the patient to answer questions. ?Past Medical History:  ?Diagnosis Date  ? Atrial arrhythmia   ? atrial tachycardia with variable AV conduction versus atypical aflutter 01/10/19, rate control (02/06/19)  ? Cataract   ? Dyspnea   ? on exertion  ? ESRD (end stage renal disease) ([place])   ? Hemo- MWF, Polycystic kidney disease  ? Fatigue   ? History of kidney stones   ? removal of stone- cysto  ? Hyperlipidemia   ? Hyperparathyroidism, secondary renal ([place])   ? Hypertension   ? Hypoxemia 12/12/2013  ? Nonischemic cardiomyopathy ([place])   ? Er 25% 2015, 55 % 2013  ? OSA on CPAP   ? no longer using cpap  ? OSA on CPAP 03/24/2014  ? Pneumonia   ? 2015ish  ? Prostate cancer ([place])   ? Ventricular tachycardia//Freq PVCs   ? Wears glasses   ? ?Past Surgical History:  ?Procedure Laterality Date  ? A-FLUTTER ABLATION N/A 04/11/2019  ? Procedure: A-FLUTTER ABLATION;  Surgeon: Evans Lance, MD;  Location: [place] [facility];  Service: Cardiovascular;  Laterality: N/A;  ? A/V FISTULAGRAM Left 04/27/2017  ? Procedure: A/V FISTULAGRAM;  Surgeon: Conrad [place], MD;  Location: [place] [facility];  Service: Cardiovascular;  Laterality: Left;  lt arm  ? A/V FISTULAGRAM Left 01/10/2019  ? Procedure: A/V FISTULAGRAM;  Surgeon: Marty Heck, MD;  Location: [place] [facility];  Service: Cardiovascular;  Laterality: Left;  ? APPENDECTOMY    ? AV FISTULA PLACEMENT  12/05/2011  ? Procedure: ARTERIOVENOUS (AV) FISTULA CREATION;LLEFT ARM  Surgeon: Conrad Waianae, MD;  Location: [place];  Service: Vascular;  Laterality: Left;  RADIO-CEPHALIC  fistula left arm  ? AV FISTULA PLACEMENT  01/11/2012  ? Procedure: ARTERIOVENOUS (AV) FISTULA CREATION;  Surgeon: Conrad [place]ville, MD;  Location: [place];  Service: Vascular;  Laterality: Left;  Creation of left brachial cephalic arteriovenous fistula  ? AV FISTULA PLACEMENT Right 03/07/2019  ? Procedure: ARTERIOVENOUS (AV) FISTULA CREATION  RIGHT ARM;  Surgeon: Marty Heck, MD;  Location: [place];  Service: Vascular;  Laterality: Right;  ? BASCILIC VEIN TRANSPOSITION Left 12/27/2016  ? Procedure: BASILIC VEIN TRANSPOSITION LEFT UPPER ARM FIRST STAGE;  Surgeon: Conrad Ronco, MD;  Location: [place];  Service: Vascular;  Laterality: Left;  ? BASCILIC VEIN TRANSPOSITION Left 01/31/2017  ? Procedure: LEFT ARM BASILIC VEIN TRANSPOSITION,  SECOND STAGE;  Surgeon: Conrad Pipestone, MD;  Location: [place];  Service: Vascular;  Laterality: Left;  ? CARDIAC CATHETERIZATION  04-05-2010  ? checking for blockage but none-[facility]  ? COLONOSCOPY    ? CYSTOSCOPY W/ STONE MANIPULATION    ? "laser once" (01/22/2013)  ? HEMATOMA EVACUATION Left 05/09/2017  ? Procedure: EVACUATION HEMATOMA LEFT ARM;  Surgeon: Conrad Bejou, MD;  Location: [place];  Service: Vascular;  Laterality: Left;  ? HERNIA REPAIR    ? INGUINAL HERNIA REPAIR Right 11/06/2015  ? Procedure: OPEN HERNIA REPAIR  RIGHT INGUINAL ADULT;  Surgeon: [name], MD;   Location: [facility];  Service: General;  Laterality: Right;  with MESH  ? INSERTION OF DIALYSIS CATHETER Right 10/05/2016  ? Procedure: INSERTION OF right internal jugular DIALYSIS CATHETER;  Surgeon: [name], MD;  Location: [place];  Service: Vascular;  Laterality: Right;  ? INSERTION OF MESH  03/20/2012  ? Procedure: INSERTION OF MESH;  UMB Surgeon: [name], MD;  Location: [place];  Service: General;  Laterality: N/A;  ? INSERTION OF MESH N/A 01/22/2013  ? Procedure: INSERTION OF MESH;  Surgeon: [name], MD;  Location: [place];  Service: General;  Laterality: N/A;  ? LAPAROTOMY  04/02/2012  ? Procedure: EXPLORATORY LAPAROTOMY;  Surgeon: [name], MD;  Location: [place];  Service: General;  Laterality: N/A;  Exploratory Laparotomy with resection of small intestine  ? LEFT HEART CATHETERIZATION WITH CORONARY ANGIOGRAM N/A 05/14/2013  ? Procedure: LEFT HEART CATHETERIZATION WITH CORONARY ANGIOGRAM;  Surgeon: Sinclair Grooms, MD;  Location: [facility] CATH LAB;  Service: Cardiovascular;  Laterality: N/A;  ? LIGATION OF ARTERIOVENOUS  FISTULA Left 12/27/2016  ? Procedure: LIGATION/EXCISION OF LEFT UPPER ARM ARTERIOVENOUS  FISTULA;  EVACUATION OF HEMATOMA;  Surgeon: Conrad Lopatcong Overlook, MD;  Location: [place];  Service: Vascular;  Laterality: Left;  ? LIGATION OF ARTERIOVENOUS  FISTULA Left 03/07/2019  ? Procedure: LIGATION OF ARTERIOVENOUS FISTULA  LEFT UPPER ARM;  Surgeon: Marty Heck, MD;  Location: [place];  Service: Vascular;  Laterality: Left;  ? REVISON OF ARTERIOVENOUS FISTULA Left 10/05/2016  ? Procedure: REVISON OF left arm ARTERIOVENOUS FISTULA;  Surgeon: [name], MD;  Location[facility];  Service: Vascular;  Laterality: Left;  ? TONSILLECTOMY    ? UMBILICAL HERNIA REPAIR  03/20/2012  ? Procedure: HERNIA REPAIR UMBILICAL ADULT;  Surgeon: [name], MD;  Location: [place];  Service: General;  Laterality: N/A;  ? UMBILICAL HERNIA REPAIR  01/22/2013  ? preperitoneal open procedure due to  significant adhesions/notes 01/22/2013  ? VENTRAL HERNIA REPAIR N/A 01/22/2013  ? Procedure: ATTEMPTED LAPAROSCOPIC VENTRAL HERNIA CONVERTED TO OPEN;  Surgeon: [name], MD;  Location: [place];  Service: General;  Laterality: N/A;  ? ?Social History:  reports that he has never smoked. He has never used smokeless tobacco. He reports that he does not drink alcohol and does not use drugs. ? ?Allergies  ?Allergen Reactions  ? Indapamide Other (See Comments)  ? ? ?Family History  ?Problem Relation Age of Onset  ? Heart disease Mother   ? Hyperlipidemia Mother   ? Hypertension Mother   ? Kidney disease Father   ? Stroke Father   ? Kidney disease Brother   ? Amblyopia Neg Hx   ? Blindness Neg Hx   ? Cataracts Neg Hx   ? Diabetes Neg Hx   ? Glaucoma Neg Hx   ? Macular degeneration Neg Hx   ? Retinal detachment  Neg Hx   ? Strabismus Neg Hx   ? Retinitis pigmentosa Neg Hx   ? Breast cancer Neg Hx   ? Prostate cancer Neg Hx   ? Colon cancer Neg Hx   ? Pancreatic cancer Neg Hx   ? ? ?Prior to Admission medications   ?Medication Sig Start Date End Date Taking? Authorizing Provider  ?acetaZOLAMIDE (DIAMOX) 250 MG tablet Take 1 tablet (250 mg total) by mouth in the morning and at bedtime. 08/06/21   [name], MD  ?acyclovir (ZOVIRAX) 400 MG tablet Take 400 mg by mouth 2 (two) times daily.    [provider]  ?apixaban (ELIQUIS) 5 MG TABS tablet Take 1 tablet by mouth twice daily 05/31/21   Early Osmond, MD  ?atropine 1 % ophthalmic solution Place 1 drop into the left eye in the morning and at bedtime.    [provider]  ?brimonidine (ALPHAGAN) 0.2 % ophthalmic solution Place 1 drop into the left eye 3 (three) times daily. 07/22/21   [name], MD  ?cinacalcet (SENSIPAR) 30 MG tablet Take 30 mg by mouth 3 (three) times a week. 10/05/20   [provider]  ?clotrimazole-betamethasone (LOTRISONE) cream Apply 1 application topically 2 (two) times daily. 12/28/20   Edrick Kins, DPM   ?dorzolamide-timolol (COSOPT) 22.3-6.8 MG/ML ophthalmic solution Place 1 drop into the left eye 3 (three) times daily. 07/22/21   [name], MD  ?losartan (COZAAR) 25 MG tablet Take 0.5 tablets (12.5 mg total) by mouth daily.

## 2021-08-11 NOTE — ED Provider Triage Note (Signed)
Emergency Medicine Provider Triage Evaluation Note ? ?Michael [name]. , a 66 y.o. male  was evaluated in triage.  Pt complains of off balance, needing help with ADLs, gait problems that have been worsening over the past 3 days. He denies any headache, chest pain, or SOB. Dialysis patient on Tu/Th/Sat and skipped Tuesday, last full dialysis was Saturday.  ? ?Review of Systems  ?Positive:  ?Negative:  ? ?Physical Exam  ?BP 124/83 (BP Location: Left Arm)   Pulse 79   Temp 98.5 ?F (36.9 ?C) (Oral)   Resp 15   SpO2 97%  ?Gen:   Awake, no distress   ?Resp:  Normal effort  ?MSK:   Moves extremities without difficulty  ?Other:  Pupillary difference on the left. EOMI not intact. Finger to nose intact.  ? ?Medical Decision Making  ?Medically screening exam initiated at 9:46 AM.  Appropriate orders placed.  Michael [name]. was informed that the remainder of the evaluation will be completed by another provider, this initial triage assessment does not replace that evaluation, and the importance of remaining in the ED until their evaluation is complete. ? ?Will order head CT. ?  ?Sherrell Puller, PA-C ?08/15/21 2149 ? ?

## 2021-08-11 NOTE — ED Notes (Addendum)
Attempted to call main phlebotomy for labs with no answer from either number [phone] and [phone]). Phlebotomy contacted because neither this RN, charge RN, or IV team able to get adequate blood for labs. ?  ?1540: attempted to call main phlebotomy x2 with no answer. ?

## 2021-08-11 NOTE — ED Notes (Signed)
Small amount of blood obtained from IV team. Sent down CBC and metabolic panel. MD aware.  ?

## 2021-08-11 NOTE — ED Notes (Signed)
Patient still unable to produce any urine for a sample at this time. Patient's wife states that he has not been producing very much urine, has not urinated today and that she doesn't remember him urinating yesterday either. Wife also states that he has not been eating/drinking very much. Pt is a dialysis patient. ?

## 2021-08-11 NOTE — Progress Notes (Signed)
Notified lab that ABG being sent for analysis. ?

## 2021-08-12 ENCOUNTER — Inpatient Hospital Stay (HOSPITAL_COMMUNITY): Payer: Medicare Other

## 2021-08-12 DIAGNOSIS — E875 Hyperkalemia: Secondary | ICD-10-CM

## 2021-08-12 DIAGNOSIS — I10 Essential (primary) hypertension: Secondary | ICD-10-CM

## 2021-08-12 DIAGNOSIS — G9341 Metabolic encephalopathy: Secondary | ICD-10-CM | POA: Diagnosis not present

## 2021-08-12 DIAGNOSIS — N186 End stage renal disease: Secondary | ICD-10-CM | POA: Diagnosis not present

## 2021-08-12 DIAGNOSIS — I5022 Chronic systolic (congestive) heart failure: Secondary | ICD-10-CM | POA: Diagnosis not present

## 2021-08-12 DIAGNOSIS — B0052 Herpesviral keratitis: Secondary | ICD-10-CM

## 2021-08-12 LAB — COMPREHENSIVE METABOLIC PANEL
ALT: 6 U/L (ref 0–44)
AST: 12 U/L — ABNORMAL LOW (ref 15–41)
Albumin: 3.3 g/dL — ABNORMAL LOW (ref 3.5–5.0)
Alkaline Phosphatase: 28 U/L — ABNORMAL LOW (ref 38–126)
Anion gap: 14 (ref 5–15)
BUN: 50 mg/dL — ABNORMAL HIGH (ref 8–23)
CO2: 24 mmol/L (ref 22–32)
Calcium: 9.2 mg/dL (ref 8.9–10.3)
Chloride: 100 mmol/L (ref 98–111)
Creatinine, Ser: 12.58 mg/dL — ABNORMAL HIGH (ref 0.61–1.24)
GFR, Estimated: 4 mL/min — ABNORMAL LOW (ref >60)
Glucose, Bld: 84 mg/dL (ref 70–99)
Potassium: 5.4 mmol/L — ABNORMAL HIGH (ref 3.5–5.1)
Sodium: 138 mmol/L (ref 135–145)
Total Bilirubin: 0.9 mg/dL (ref 0.3–1.2)
Total Protein: 6.2 g/dL — ABNORMAL LOW (ref 6.5–8.1)

## 2021-08-12 LAB — CBG MONITORING, ED
Glucose-Capillary: 76 mg/dL (ref 70–99)
Glucose-Capillary: 87 mg/dL (ref 70–99)
Glucose-Capillary: 78 mg/dL (ref 70–99)
Glucose-Capillary: 73 mg/dL (ref 70–99)

## 2021-08-12 LAB — CBC
HCT: 42 % (ref 39.0–52.0)
Hemoglobin: 12.8 g/dL — ABNORMAL LOW (ref 13.0–17.0)
MCH: 27.9 pg (ref 26.0–34.0)
MCHC: 30.5 g/dL (ref 30.0–36.0)
MCV: 91.7 fL (ref 80.0–100.0)
Platelets: 144 10*3/uL — ABNORMAL LOW (ref 150–400)
RBC: 4.58 MIL/uL (ref 4.22–5.81)
RDW: 14.4 % (ref 11.5–15.5)
WBC: 5.6 10*3/uL (ref 4.0–10.5)
nRBC: 0 % (ref 0.0–0.2)

## 2021-08-12 LAB — BLOOD GAS, ARTERIAL
Acid-Base Excess: 1.9 mmol/L (ref 0.0–2.0)
Bicarbonate: 28.2 mmol/L — ABNORMAL HIGH (ref 20.0–28.0)
O2 Saturation: 98 %
Patient temperature: 37.2
pCO2 arterial: 50 mmHg — ABNORMAL HIGH (ref 32–48)
pH, Arterial: 7.36 (ref 7.35–7.45)
pO2, Arterial: 93 mmHg (ref 83–108)

## 2021-08-12 LAB — AMMONIA: Ammonia: 25 umol/L (ref 9–35)

## 2021-08-12 LAB — GLUCOSE, CAPILLARY
Glucose-Capillary: 50 mg/dL — ABNORMAL LOW (ref 70–99)
Glucose-Capillary: 52 mg/dL — ABNORMAL LOW (ref 70–99)
Glucose-Capillary: 98 mg/dL (ref 70–99)
Glucose-Capillary: 123 mg/dL — ABNORMAL HIGH (ref 70–99)
Glucose-Capillary: 106 mg/dL — ABNORMAL HIGH (ref 70–99)
Glucose-Capillary: 112 mg/dL — ABNORMAL HIGH (ref 70–99)

## 2021-08-12 LAB — HEPATITIS B SURFACE ANTIBODY,QUALITATIVE: Hep B S Ab: REACTIVE — AB

## 2021-08-12 LAB — HEPATITIS B SURFACE ANTIGEN: Hepatitis B Surface Ag: NONREACTIVE

## 2021-08-12 LAB — HIV ANTIBODY (ROUTINE TESTING W REFLEX): HIV Screen 4th Generation wRfx: NONREACTIVE

## 2021-08-12 MED ORDER — DEXTROSE 50 % IV SOLN
1.0000 | Freq: Once | INTRAVENOUS | Status: AC
  Administered 2021-08-12: 50 mL via INTRAVENOUS
  Filled 2021-08-12: qty 50

## 2021-08-12 MED ORDER — HEPARIN (PORCINE) 25000 UT/250ML-% IV SOLN: 1250.0000 [IU]/h | INTRAVENOUS | Status: DC

## 2021-08-12 MED ORDER — METOPROLOL TARTRATE 5 MG/5ML IV SOLN: 2.5000 mg | Freq: Three times a day (TID) | INTRAVENOUS | Status: DC | PRN

## 2021-08-12 MED ORDER — DEXTROSE 5 % IV SOLN: INTRAVENOUS | Status: DC

## 2021-08-12 MED ORDER — CALCITRIOL 0.5 MCG PO CAPS
ORAL_CAPSULE | ORAL | Status: DC
Start: 2021-08-12 — End: 2021-08-12
  Filled 2021-08-12: qty 4

## 2021-08-12 MED ORDER — HEPARIN (PORCINE) 25000 UT/250ML-% IV SOLN
1450.0000 [IU]/h | INTRAVENOUS | Status: DC
  Administered 2021-08-12 (×2): 1250 [IU]/h via INTRAVENOUS
  Filled 2021-08-12: qty 250

## 2021-08-12 MED ORDER — CALCITRIOL 0.5 MCG PO CAPS: 2.0000 ug | ORAL_CAPSULE | ORAL | Status: DC

## 2021-08-12 MED ORDER — APIXABAN 5 MG PO TABS: 5.0000 mg | ORAL_TABLET | Freq: Two times a day (BID) | ORAL | Status: DC

## 2021-08-12 MED ORDER — GLUCOSE 40 % PO GEL
1.0000 | Freq: Four times a day (QID) | ORAL | Status: AC
  Administered 2021-08-13: 31 g via ORAL
  Filled 2021-08-12 (×2): qty 1

## 2021-08-12 NOTE — ED Notes (Signed)
Patients family provided with recliner and blanket for comfort.   ?

## 2021-08-12 NOTE — Progress Notes (Signed)
ANTICOAGULATION CONSULT NOTE ? ?Pharmacy Consult for IV  ?Indication: atrial fibrillation ? ?Allergies  ?Allergen Reactions  ? Allopurinol   ?  Other reaction(s): foot pain  ? Atorvastatin Itching  ?  Leg pain ?Other reaction(s): itching  ? Colchicine Other (See Comments)  ?  Other reaction(s): foot pain  ? Indapamide Other (See Comments)  ? Viagra [Sildenafil] Other (See Comments)  ?  Feels weird - Levitra. - headache  ? ? ?Patient Measurements: ?Height: '5\' 7"'$  (170.2 cm) ?Weight: 89.4 kg (197 lb) ?IBW/kg (Calculated) : 66.1 ?Heparin Dosing Weight: 85 kg ? ?Vital Signs: ?BP: 123/84 (04/13 0630) ?Pulse Rate: 81 (04/13 0630) ? ?Labs: ?Recent Labs  ?  08/11/21 ?1300 08/11/21 ?1600 08/11/21 ?1607 08/11/21 ?1840 08/12/21 ?0430  ?HGB 12.6*  --  12.9*  --  12.8*  ?HCT 41.5  --  38.0*  --  42.0  ?PLT 108*  --   --   --  144*  ?CREATININE 13.89*  --  14.20*  --  12.58*  ?TROPONINIHS  --  89*  --  75*  --   ? ? ?Estimated Creatinine Clearance: 6.3 mL/min (A) (by C-G formula based on SCr of 12.58 mg/dL (H)). ? ? ?Medical History: ?Past Medical History:  ?Diagnosis Date  ? Atrial arrhythmia   ? atrial tachycardia with variable AV conduction versus atypical aflutter 01/10/19, rate control (02/06/19)  ? Cataract   ? Dyspnea   ? on exertion  ? ESRD (end stage renal disease) ([place])   ? Hemo- MWF, Polycystic kidney disease  ? Fatigue   ? History of kidney stones   ? removal of stone- cysto  ? Hyperlipidemia   ? Hyperparathyroidism, secondary renal ([place])   ? Hypertension   ? Hypoxemia 12/12/2013  ? Nonischemic cardiomyopathy ([place])   ? Er 25% 2015, 55 % 2013  ? OSA on CPAP   ? no longer using cpap  ? OSA on CPAP 03/24/2014  ? Pneumonia   ? 2015ish  ? Prostate cancer ([place])   ? Ventricular tachycardia//Freq PVCs   ? Wears glasses   ? ? ?Medications:  ?Medications Prior to Admission  ?Medication Sig Dispense Refill Last Dose  ? acyclovir (ZOVIRAX) 400 MG tablet Take 400 mg by mouth 2 (two) times daily.   08/08/2021  ? apixaban (ELIQUIS) 5 MG  TABS tablet Take 1 tablet by mouth twice daily (Patient taking differently: Take 5 mg by mouth daily.) 180 tablet 1 08/08/2021 at unk last dose  ? cinacalcet (SENSIPAR) 30 MG tablet Take 30 mg by mouth daily.   08/08/2021  ? losartan (COZAAR) 25 MG tablet Take 0.5 tablets (12.5 mg total) by mouth daily. (Patient taking differently: Take 25 mg by mouth daily.) 45 tablet 3 08/08/2021  ? metoprolol succinate (TOPROL XL) 25 MG 24 hr tablet Take 1 tablet (25 mg total) by mouth at bedtime. 90 tablet 3 08/08/2021 at 6 pm  ? rosuvastatin (CRESTOR) 10 MG tablet Take 1 tablet (10 mg total) by mouth daily. 90 tablet 3 08/08/2021  ? sevelamer carbonate (RENVELA) 800 MG tablet Take 1,600 mg by mouth 3 (three) times daily with meals.   08/08/2021  ? spironolactone (ALDACTONE) 25 MG tablet Take 25 mg by mouth at bedtime.   08/08/2021  ? acetaZOLAMIDE (DIAMOX) 250 MG tablet Take 1 tablet (250 mg total) by mouth in the morning and at bedtime. 60 tablet 0   ? atropine 1 % ophthalmic solution Place 1 drop into the left eye in the morning and at  bedtime.     ? brimonidine (ALPHAGAN) 0.2 % ophthalmic solution Place 1 drop into the left eye 3 (three) times daily. 10 mL 2   ? clotrimazole-betamethasone (LOTRISONE) cream Apply 1 application topically 2 (two) times daily. 45 g 2   ? dorzolamide-timolol (COSOPT) 22.3-6.8 MG/ML ophthalmic solution Place 1 drop into the left eye 3 (three) times daily. 10 mL 2   ? Loteprednol Etabonate (LOTEMAX SM) 0.38 % GEL Place 1 drop into the left eye every 2 (two) hours. 5 g 3   ? nitroGLYCERIN (NITROSTAT) 0.4 MG SL tablet Place 1 tablet (0.4 mg total) under the tongue every 5 (five) minutes x 3 doses as needed for chest pain. 30 tablet 12   ? sodium chloride (MURO 128) 2 % ophthalmic solution Place 1 drop into the left eye 4 (four) times daily. 15 mL 2   ? ?Scheduled:  ? calcitRIOL  2 mcg Oral Q T,Th,Sa-HD  ? Chlorhexidine Gluconate Cloth  6 each Topical Q0600  ? ? ?Assessment: ?43 yoM with PMH ESRD on HD TTS, HTN,  LVEF < 20%, permanent Afib on Eliquis, admitted 4/12 with AMS, weakness, & falls. Missed last HD session 4/11. Unable to take PO; Pharmacy to dose IV heparin while Eliquis on hold. ? ?Baseline INR, aPTT: not done; baseline heparin level pending ?Prior anticoagulation: Eliquis 5 mg BID, although wife reports pt taking once daily (med rec not fully complete at this time). Last dose thought to be 4/9. ? ?Significant events: ? ?Today, 08/12/2021: ?CBC: Hgb/Plt both slightly low but stable ?No bleeding or infusion issues per nursing ?SCr elevated d/t ESRD - not clinically meaningful ? ?Goal of Therapy: ?Heparin level 0.3-0.7 units/ml ?Monitor platelets by anticoagulation protocol: Yes ? ?Plan: ?Heparin 1250 units/hr IV infusion ?Check aPTT & heparin level 8 hrs after start ?Daily CBC, daily heparin level once stable; aPTT as needed if/while HL elevated from recent DOAC ?Monitor for signs of bleeding or thrombosis ? ?Reuel Boom, PharmD, BCPS ?[phone] ?08/12/2021, 10:14 AM ? ? ? ? ?

## 2021-08-12 NOTE — Progress Notes (Signed)
Subjective:  Finally has been transported to [facility] this AM-  taken directly to HD-  he is less responsive but VS continue to be stable  ?Objective ?Vital signs in last 24 hours: ?Vitals:  ? 08/12/21 0500 08/12/21 0530 08/12/21 0630 08/12/21 0936  ?BP: 128/79 105/81 123/84 96/60  ?Pulse: 77 82 81 79  ?Resp: 16 (!) 23 (!) 21 18  ?Temp:    98.3 ?F (36.8 ?C)  ?TempSrc:    Axillary  ?SpO2: 100% 100% 100%   ?Weight:    86.9 kg  ?Height:      ? ?Weight change:  ?No intake or output data in the 24 hours ending 08/12/21 0944 ? ? ? ?Dialyzes at [facility] TTS but missed last Thurs and this Tuesday  EDW 86 ( but seems like it needs to be lower). 4 hours and 15 minutes-  profile #2 ?HD [place] 2/2, Dialyzer 180, Heparin no. Access [facility]-  refusing other. Calcitriol 2 mcg tiw-  last hgb 12.6 ?  ? ?Assessment/Plan: 66 year old BM with HTN, OSA, cardiomyopathy, atrial flutter and ESRD presenting with decreased MS ?1 decreased MS-  brain imaging unremarkable-  exam not focal-  just globally slow.   I suspect one HD treatment in the last 8 days has something to do with his presentation-  labs not as back as I thought they would be-  labs otherwise unremarkable-  cannot identify any sedating meds however, he is on acycloivr which can build up in ESRD pts -  looks like maybe dose has been increased of late-  holding   ?2 ESRD: normally TTS via [facility]-  has missed 2 of his last 3 treatments-  awaiting labs bur suspect he will need dialysis soon.  TDC does not appear obviously infected but that is in the differential - especially if demonstrates elevated WBC ?3 Hypertension: normally BP is high based on his med list-   now relatively low-  hold BP meds and follow  ?4. Anemia of ESRD:   last hgb at OP center was good above 12-  actually has a history of polycythemia  ?5. Metabolic Bone Disease: calcium OK -  will cont vit D  ?  ? ?Louis Meckel  ? ? ?Labs: ?Basic Metabolic Panel: ?Recent Labs  ?Lab 08/11/21 ?1300 08/11/21 ?1607  08/12/21 ?0430  ?NA 137 136 138  ?K 6.2* 5.2* 5.4*  ?CL 99 99 100  ?CO2 28  --  24  ?GLUCOSE 65* 56* 84  ?BUN 44* 42* 50*  ?CREATININE 13.89* 14.20* 12.58*  ?CALCIUM 9.2  --  9.2  ? ?Liver Function Tests: ?Recent Labs  ?Lab 08/11/21 ?1300 08/12/21 ?0430  ?AST 17 12*  ?ALT 6 6  ?ALKPHOS 26* 28*  ?BILITOT 1.1 0.9  ?PROT 5.9* 6.2*  ?ALBUMIN 3.0* 3.3*  ? ?No results for input(s): LIPASE, AMYLASE in the last 168 hours. ?Recent Labs  ?Lab 08/11/21 ?1600 08/12/21 ?0836  ?AMMONIA 20 25  ? ?CBC: ?Recent Labs  ?Lab 08/11/21 ?1300 08/11/21 ?1607 08/12/21 ?0430  ?WBC 4.2  --  5.6  ?NEUTROABS 3.1  --   --   ?HGB 12.6* 12.9* 12.8*  ?HCT 41.5 38.0* 42.0  ?MCV 90.4  --  91.7  ?PLT 108*  --  144*  ? ?Cardiac Enzymes: ?No results for input(s): CKTOTAL, CKMB, CKMBINDEX, TROPONINI in the last 168 hours. ?CBG: ?Recent Labs  ?Lab 08/11/21 ?2239 08/12/21 ?0141 08/12/21 ?0429 08/12/21 ?0602 08/12/21 ?3016  ?GLUCAP 85 76 87 78 73  ? ? ?  Iron Studies:  ?Recent Labs  ?  08/11/21 ?1914  ?IRON 79  ?TIBC 367  ? ?Studies/Results: ?CT Head Wo Contrast ? ?Result Date: 08/11/2021 ?CLINICAL DATA:  Transient ischemic attack (TIA) EXAM: CT HEAD WITHOUT CONTRAST TECHNIQUE: Contiguous axial images were obtained from the base of the skull through the vertex without intravenous contrast. RADIATION DOSE REDUCTION: This exam was performed according to the departmental dose-optimization program which includes automated exposure control, adjustment of the mA and/or kV according to patient size and/or use of iterative reconstruction technique. COMPARISON:  CT head August 19, 2020. FINDINGS: Brain: No evidence of acute infarction, hemorrhage, hydrocephalus, extra-axial collection or mass lesion/mass effect. Similar atrophy and chronic microvascular ischemic disease. Vascular: No hyperdense vessel identified. Skull: No acute fracture. Sinuses/Orbits: Clear sinuses. No acute orbital findings. Other: No mastoid effusions. IMPRESSION: No evidence of acute intracranial  abnormality. Electronically Signed   By: Margaretha Sheffield M.D.   On: 08/11/2021 10:11  ? ?MR BRAIN WO CONTRAST ? ?Result Date: 08/11/2021 ?CLINICAL DATA:  Transient ischemic attack (TIA) EXAM: MRI HEAD WITHOUT CONTRAST TECHNIQUE: Multiplanar, multiecho pulse sequences of the brain and surrounding structures were obtained without intravenous contrast. COMPARISON:  CT head August 19, 2020.  MRI head June 02, 2019. FINDINGS: Brain: No acute infarction, hemorrhage, hydrocephalus, extra-axial collection or mass lesion. Subjectively mild cerebral atrophy for patient age. Vascular: Major arterial flow voids are maintained the skull base. Skull and upper cervical spine: Normal marrow signal. Sinuses/Orbits: Largely clear sinuses.  No acute orbital findings Other: No mastoid effusions. IMPRESSION: No evidence of acute intracranial abnormality. Electronically Signed   By: Margaretha Sheffield M.D.   On: 08/11/2021 12:24  ? ?DG Chest Port 1 View ? ?Result Date: 08/11/2021 ?CLINICAL DATA:  Short of breath.  ESRD on dialysis EXAM: PORTABLE CHEST 1 VIEW COMPARISON:  11/17/2020 FINDINGS: Marked cardiac enlargement which appears progressive. Possible pericardial effusion versus projectional differences. Vascularity normal. Negative for fluid overload or edema. No pleural effusion. No focal infiltrate Right jugular central venous catheter tip at the cavoatrial junction unchanged. IMPRESSION: Marked cardiac enlargement.  Question pericardial effusion. Negative for edema or infiltrate. Electronically Signed   By: Franchot Gallo M.D.   On: 08/11/2021 11:25   ?Medications: ?Infusions: ? sodium chloride    ? sodium chloride    ? heparin    ? ? ?Scheduled Medications: ? Chlorhexidine Gluconate Cloth  6 each Topical Q0600  ? ? have reviewed scheduled and prn medications. ? ?Physical Exam: ?General:  more somnolent but arousable with enough stimulation ?Heart: RRR ?Lungs: mostly clear ?Abdomen: soft, non tender ?Extremities: a little  edema ?Dialysis Access: [facility]   ? ? ?08/12/2021,9:44 AM ? LOS: 1 day  ? ?  ? ? ? ? ?

## 2021-08-12 NOTE — Procedures (Signed)
Patient was seen on dialysis and the procedure was supervised.  BFR 400  Via TDC BP is  111/77. ? ? Patient appears to be tolerating treatment well ? ?Michael [name] ?08/12/2021 ? ?

## 2021-08-12 NOTE — Progress Notes (Signed)
Pt wife at bedside requested for bed alarms to be disarmed when she is at bedside. Alarms DC at this time. Pt and Pt wife educated on high fall risk status and the dangers of falls. Charge RN notified of situation. ?

## 2021-08-12 NOTE — Consult Note (Signed)
Neurology Consultation ?Reason for Consult: Altered mental status ?Referring Physician: Darrick Meigs, G ? ?CC: Altered mental status ? ?History is obtained from: Chart review ? ?HPI: Michael [name]. is a 66 y.o. male with a history of ESRD who was started on acyclovir as an outpatient for herpes keratitis.  He also missed several dialysis episodes, but before today he had had only one dialysis treatment in the previous days.  His dose was appropriately reduced, however he missed multiple days of dialysis.  His wife reports that the somnolence really started about 4 days ago.  She was unable to get him to wake up, so does not know if he was confused on top of being sleepy.  Today, he was much more awake, speaking relatively normally.  He fell asleep this afternoon, however. ? ?MRI is negative ? ? ?ROS:  Unable to obtain due to altered mental status.  ? ?Past Medical History:  ?Diagnosis Date  ? Atrial arrhythmia   ? atrial tachycardia with variable AV conduction versus atypical aflutter 01/10/19, rate control (02/06/19)  ? Cataract   ? Dyspnea   ? on exertion  ? ESRD (end stage renal disease) ([place])   ? Hemo- MWF, Polycystic kidney disease  ? Fatigue   ? History of kidney stones   ? removal of stone- cysto  ? Hyperlipidemia   ? Hyperparathyroidism, secondary renal ([place])   ? Hypertension   ? Hypoxemia 12/12/2013  ? Nonischemic cardiomyopathy ([place])   ? Er 25% 2015, 55 % 2013  ? OSA on CPAP   ? no longer using cpap  ? OSA on CPAP 03/24/2014  ? Pneumonia   ? 2015ish  ? Prostate cancer ([place])   ? Ventricular tachycardia//Freq PVCs   ? Wears glasses   ? ? ? ?Family History  ?Problem Relation Age of Onset  ? Heart disease Mother   ? Hyperlipidemia Mother   ? Hypertension Mother   ? Kidney disease Father   ? Stroke Father   ? Kidney disease Brother   ? Amblyopia Neg Hx   ? Blindness Neg Hx   ? Cataracts Neg Hx   ? Diabetes Neg Hx   ? Glaucoma Neg Hx   ? Macular degeneration Neg Hx   ? Retinal detachment Neg Hx   ? Strabismus Neg Hx    ? Retinitis pigmentosa Neg Hx   ? Breast cancer Neg Hx   ? Prostate cancer Neg Hx   ? Colon cancer Neg Hx   ? Pancreatic cancer Neg Hx   ? ? ? ?Social History:  reports that he has never smoked. He has never used smokeless tobacco. He reports that he does not drink alcohol and does not use drugs. ? ? ?Exam: ?Current vital signs: ?BP 109/74 (BP Location: Right Arm)   Pulse 87   Temp 98.9 ?F (37.2 ?C) (Oral)   Resp 20   Ht '5\' 7"'$  (1.702 m)   Wt 85.3 kg   SpO2 97%   BMI 29.45 kg/m?  ?Vital signs in last 24 hours: ?Temp:  [97.7 ?F (36.5 ?C)-98.9 ?F (37.2 ?C)] 98.9 ?F (37.2 ?C) (04/13 1940) ?Pulse Rate:  [69-95] 87 (04/13 1940) ?Resp:  [15-30] 20 (04/13 1940) ?BP: (76-128)/(42-86) 109/74 (04/13 1940) ?SpO2:  [96 %-100 %] 97 % (04/13 1940) ?Weight:  [85.3 kg-86.9 kg] 85.3 kg (04/13 1403) ? ? ?Physical Exam  ?Constitutional: Appears well-developed and well-nourished.  ?Psych: Affect appropriate to situation ?Eyes: No scleral injection ?HENT: No OP obstruction ?MSK: no joint deformities.  ?  Cardiovascular: Normal rate and regular rhythm.  ?Respiratory: Effort normal, non-labored breathing ?GI: Soft.  No distension. There is no tenderness.  ?Skin: WDI ? ?Neuro: ?Mental Status: ?Patient is lethargic but awakens to noxious stimulation, but does not follow commands. ?Cranial Nerves: ?II: He fixates and tracks.  He aggressively resists checking pupillary reaction ?III,IV, VI: Initially I had the sense that he had a right gaze preference, but I think it was just because his wife is on his side, following noxious stimulation applied to his left side he does cross midline. ?V: Facial sensation is symmetric to temperature ?VII: Facial movement is symmetric.  ?Motor: ?He is positioned laying on his left side, it is difficult again to extend at the shoulder, but following noxious stimulation he does move his left arm relatively well.  He moves his right side well. ?Sensory: ?He response to noxious stimulation in all four  extremities\ ?Cerebellar: ?Does not perform ? ? ?I have reviewed labs in epic and the results pertinent to this consultation are: ?Ammonia 20 ?ABG from yesterday morning 7.3 5/50/111 ? ?I have reviewed the images obtained: MRI brain-negative ? ?Impression: 66 year old male with encephalopathy of unclear etiology.  I agree with nephrology that this could be acyclovir toxicity given the missed dialysis in the setting of taking this medication systemically.  With his improvement earlier, I think herpes encephalitis is less likely, however I will pause his Eliquis and change him to heparin in case an LP is needed in the future.  I also think, especially given the waxing/waning exam that he needs an EEG. ? ?Recommendations: ?1) EEG ?2) we will continue to hold acyclovir at this time ?3) if he is not improving may need LP and therefore I am holding Eliquis and starting heparin at this time ?4) repeat ABG as intermittent hypercarbia could also be causing his symptoms ?5) neurology will continue to follow ? ? ?Roland Rack, MD ?[facility] ?[phone] ? ?If 7pm- 7am, please page neurology on call as listed in [place]. ? ?

## 2021-08-12 NOTE — Progress Notes (Addendum)
1620-Pt arrived to unit via transport from HD. Report received from [place], [place].  ? ?1630- Pt lethargic, but aroused by voiced. POCT BS 50. No hypoglycemic orders in place and not checked or reported prior to arriving to unit.  ? ?91- Attending MD Marylyn Ishihara not available epic chat or amion.  ? ?1640- Attending Antonieta Pert and Marge Duncans, MD contacted.  With orders to be placed. ? ?

## 2021-08-12 NOTE — Progress Notes (Signed)
removed 1559ms net fluid. unable to remove more due to hypotension and pt complaints of cramping.  pt hard to wake up and he is confused at times.  obtained physician consent due to pt unable to sign upon [place] [facility]. pre bp 96/60 post bp 104/85 pre weight 86.9kg post weight 85.0kg .  catheter ran well packed with heparin clamped and capped. changed dressing to right chest. ?

## 2021-08-12 NOTE — Progress Notes (Addendum)
[facility] ? ?PROGRESS NOTE ? ?[name] XIP:[id number] DOB: [birth year] DOA: 08/11/2021 ?PCP: [name], MD ? ? ?Brief HPI:   ?66 year old male with medical history of ESRD on HD, hypertension, chronic HFrEF, hyperlipidemia was admitted with altered mental status.  Patient has been weak and wobbly for past few days and had several falls.  Patient's weakness progressed over the past 3 days and he became more somnolent and confused. ?Patient missed his hemodialysis on Tuesday.  He was started on acyclovir for herpes keratitis by ophthalmology. ?Nephrology was consulted, plan to transfer to [facility] for hemodialysis. ? ? ?Subjective  ? ?Patient appears confused. ? ? Assessment/Plan:  ? ? ? ?Acute metabolic encephalopathy ?-Unclear etiology, CT head unremarkable, MRI negative for acute intracranial abnormality ?-Creatinine elevated to 12.58, BUN 50; missed hemodialysis on Tuesday ?-?  Acyclovir toxicity; as per nephrology it can build up in ESRD patients ?-We will check serum ammonia level ?-Patient will be transferred to [facility] for hemodialysis ?-He is afebrile with normal WBC count, chest x-ray is unremarkable, vitals are stable-does not seem to be infectious etiology ? ?ESRD ?-Patient on hemodialysis Tuesday Thursday and Saturday via [facility] ?-He missed last 2 of his treatments ?-Plan for hemodialysis at [facility] ?-Awaiting transfer to [facility] ? ?Mild hyperkalemia ?-Potassium is 5.4 ?-We will get hemodialysis today as above ? ?Hypoglycemia ?-Blood glucose has been low, likely from poor p.o. intake ?-Encourage p.o. intake ?-Continue monitoring with CBG every 2 hours ? ?Herpes keratitis ?-Patient has been on acyclovir for chronic dendritic herpes keratitis ?-Acyclovir on hold as per nephrology as it's level can build up in ESRD patients ? ?Chronic systolic CHF ?-Echocardiogram from December 2022 shows less than 20% EF as per echo , right ventricle function is severely reduced ?-Chest x-ray showed  mild cardiac enlargement, question pericardial effusion ?-We will obtain echocardiogram ? ?Atrial fibrillation ?-Patient has permanent atrial fibrillation ?-Heart rate controlled ?-He takes Eliquis at home for anticoagulation ?-He is on metoprolol at home, will start IV metoprolol 2.5 mg every 8 hours as needed for heart rate greater than 100 ?-Eliquis on hold due to altered mental status ?-Start heparin per pharmacy and switch to Eliquis once patient able to take p.o. ? ?Hypertension ?-Blood pressure is stable ?-Antihypertensive medications on hold ? ?Medications ? ?  ? Chlorhexidine Gluconate Cloth  6 each Topical Q0600  ? heparin  5,000 Units Subcutaneous Q8H  ? ? ? Data Reviewed:  ? ?CBG: ? ?Recent Labs  ?Lab 08/11/21 ?2115 08/11/21 ?2239 08/12/21 ?0141 08/12/21 ?0429 08/12/21 ?0602  ?GLUCAP 54* 85 76 87 78  ? ? ?SpO2: 100 %  ? ? ?Vitals:  ? 08/12/21 0445 08/12/21 0500 08/12/21 0530 08/12/21 0630  ?BP:  128/79 105/81 123/84  ?Pulse: 95 77 82 81  ?Resp: 19 16 (!) 23 (!) 21  ?Temp:      ?TempSrc:      ?SpO2: 100% 100% 100% 100%  ?Weight:      ?Height:      ? ? ? ? ?Data Reviewed: ? ?Basic Metabolic Panel: ?Recent Labs  ?Lab 08/11/21 ?1300 08/11/21 ?1607 08/12/21 ?0430  ?NA 137 136 138  ?K 6.2* 5.2* 5.4*  ?CL 99 99 100  ?CO2 28  --  24  ?GLUCOSE 65* 56* 84  ?BUN 44* 42* 50*  ?CREATININE 13.89* 14.20* 12.58*  ?CALCIUM 9.2  --  9.2  ? ? ?CBC: ?Recent Labs  ?Lab 08/11/21 ?1300 08/11/21 ?1607 08/12/21 ?0430  ?WBC 4.2  --  5.6  ?NEUTROABS 3.1  --   --   ?HGB 12.6* 12.9* 12.8*  ?HCT 41.5 38.0* 42.0  ?MCV 90.4  --  91.7  ?PLT 108*  --  144*  ? ? ?LFT ?Recent Labs  ?Lab 08/11/21 ?1300 08/12/21 ?0430  ?AST 17 12*  ?ALT 6 6  ?ALKPHOS 26* 28*  ?BILITOT 1.1 0.9  ?PROT 5.9* 6.2*  ?ALBUMIN 3.0* 3.3*  ? ?  ?Antibiotics: ?Anti-infectives (From admission, onward)  ? ? None  ? ?  ? ? ? ?DVT prophylaxis: Heparin ? ?Code Status: Full code ? ?Family Communication: Discussed with wife at bedside ? ? ?CONSULTS nephrology ? ? ?Objective   ? ? ?Physical Examination: ? ? ?General: Appears lethargic ?Cardiovascular: S1-S2, regular, no murmur auscultated ?Respiratory: Clear to auscultation bilaterally ?Abdomen: Abdomen is soft, nontender, no organomegaly ?Extremities: No edema in the lower extremities ?Neurologic: Alert, not oriented x3, confused ? ? ?Status is: Inpatient: Altered mental status ? ? ? ?  ? ? ? ? ? ?Oswald Hillock ?  ?[facility] ?If 7PM-7AM, please contact night-coverage at [email or web address], ?Office  [phone] ? ? ?08/12/2021, 8:08 AM  LOS: 1 day  ? ? ? ? ? ? ? ? ? ? ?  ?

## 2021-08-12 NOTE — Progress Notes (Signed)
ANTICOAGULATION CONSULT NOTE ? ?Pharmacy Consult for IV  ?Indication: atrial fibrillation ? ?Allergies  ?Allergen Reactions  ? Colchicine Other (See Comments)  ?  Other reaction(s): foot pain  ? Indapamide Other (See Comments)  ?  unknown  ? Lipitor [Atorvastatin] Itching  ?  Leg pain  ? Viagra [Sildenafil] Other (See Comments)  ?  Feels weird - Levitra. - headache  ? Zyloprim [Allopurinol] Other (See Comments)  ?  foot pain  ? ? ?Patient Measurements: ?Height: '5\' 7"'$  (170.2 cm) ?Weight: 85.3 kg (188 lb 0.8 oz) ?IBW/kg (Calculated) : 66.1 ?Heparin Dosing Weight: 85 kg ? ?Vital Signs: ?Temp: 98.9 ?F (37.2 ?C) (04/13 1940) ?Temp Source: Oral (04/13 1940) ?BP: 109/74 (04/13 1940) ?Pulse Rate: 87 (04/13 1940) ? ?Labs: ?Recent Labs  ?  08/11/21 ?1300 08/11/21 ?1600 08/11/21 ?1607 08/11/21 ?1840 08/12/21 ?0430  ?HGB 12.6*  --  12.9*  --  12.8*  ?HCT 41.5  --  38.0*  --  42.0  ?PLT 108*  --   --   --  144*  ?CREATININE 13.89*  --  14.20*  --  12.58*  ?TROPONINIHS  --  89*  --  75*  --   ? ? ? ?Estimated Creatinine Clearance: 6.2 mL/min (A) (by C-G formula based on SCr of 12.58 mg/dL (H)). ? ? ?Medical History: ?Past Medical History:  ?Diagnosis Date  ? Atrial arrhythmia   ? atrial tachycardia with variable AV conduction versus atypical aflutter 01/10/19, rate control (02/06/19)  ? Cataract   ? Dyspnea   ? on exertion  ? ESRD (end stage renal disease) ([place])   ? Hemo- MWF, Polycystic kidney disease  ? Fatigue   ? History of kidney stones   ? removal of stone- cysto  ? Hyperlipidemia   ? Hyperparathyroidism, secondary renal ([place])   ? Hypertension   ? Hypoxemia 12/12/2013  ? Nonischemic cardiomyopathy ([place])   ? Er 25% 2015, 55 % 2013  ? OSA on CPAP   ? no longer using cpap  ? OSA on CPAP 03/24/2014  ? Pneumonia   ? 2015ish  ? Prostate cancer ([place])   ? Ventricular tachycardia//Freq PVCs   ? Wears glasses   ? ? ?Medications:  ?Medications Prior to Admission  ?Medication Sig Dispense Refill Last Dose  ? acyclovir (ZOVIRAX) 400 MG  tablet Take 400 mg by mouth 2 (two) times daily.   Past Week  ? apixaban (ELIQUIS) 5 MG TABS tablet Take 1 tablet by mouth twice daily (Patient taking differently: Take 5 mg by mouth daily.) 180 tablet 1 Past Week at unk last dose  ? atropine 1 % ophthalmic solution Place 1 drop into the left eye in the morning and at bedtime.   Past Week  ? brimonidine (ALPHAGAN) 0.2 % ophthalmic solution Place 1 drop into the left eye 3 (three) times daily. 10 mL 2 Past Week  ? cinacalcet (SENSIPAR) 30 MG tablet Take 30 mg by mouth daily.   Past Week  ? clotrimazole-betamethasone (LOTRISONE) cream Apply 1 application topically 2 (two) times daily. 45 g 2 Past Week  ? dorzolamide-timolol (COSOPT) 22.3-6.8 MG/ML ophthalmic solution Place 1 drop into the left eye 3 (three) times daily. 10 mL 2 Past Week  ? losartan (COZAAR) 25 MG tablet Take 0.5 tablets (12.5 mg total) by mouth daily. (Patient taking differently: Take 25 mg by mouth daily.) 45 tablet 3 Past Week  ? Loteprednol Etabonate (LOTEMAX SM) 0.38 % GEL Place 1 drop into the left eye every 2 (two)  hours. 5 g 3 Past Week  ? metoprolol succinate (TOPROL XL) 25 MG 24 hr tablet Take 1 tablet (25 mg total) by mouth at bedtime. 90 tablet 3 Past Week at 1800  ? nitroGLYCERIN (NITROSTAT) 0.4 MG SL tablet Place 1 tablet (0.4 mg total) under the tongue every 5 (five) minutes x 3 doses as needed for chest pain. 30 tablet 12 unk  ? rosuvastatin (CRESTOR) 10 MG tablet Take 1 tablet (10 mg total) by mouth daily. 90 tablet 3 Past Week  ? sevelamer carbonate (RENVELA) 800 MG tablet Take 1,600 mg by mouth 3 (three) times daily with meals.   Past Week  ? sodium chloride (MURO 128) 2 % ophthalmic solution Place 1 drop into the left eye 4 (four) times daily. 15 mL 2 Past Week  ? spironolactone (ALDACTONE) 25 MG tablet Take 25 mg by mouth at bedtime.   Past Week  ? acetaZOLAMIDE (DIAMOX) 250 MG tablet Take 1 tablet (250 mg total) by mouth in the morning and at bedtime. (Patient not taking:  Reported on 08/12/2021) 60 tablet 0 Not Taking  ? ?Scheduled:  ? Chlorhexidine Gluconate Cloth  6 each Topical Q0600  ? dextrose  1 Tube Oral Q6H  ? ? ?Assessment: ?53 yoM with PMH ESRD on HD TTS, HTN, LVEF < 20%, permanent Afib on Eliquis with last dose thought to be 4/9, admitted 4/12 with AMS, weakness, & falls. Missed last HD session 4/11.  ? ?Heparin was ordered, but never started patient was subsequently transferred to [facility] main and did not have IV access so apixiban was ordered, but not given. Pharmacy has not be consulted to dose heparin while Eliquis on hold given possible need for lumbar puncture.  ? ?CBC stable with platelets on the lower side.  ? ?Goal of Therapy: ?Heparin level 0.3-0.7 units/ml ?Monitor platelets by anticoagulation protocol: Yes ? ?Plan: ?Heparin 1250 units/hr IV infusion ?Check aPTT & heparin level 8 hrs after start ?Daily CBC, daily heparin level once stable; aPTT as needed if/while HL elevated from recent DOAC ?Monitor for signs of bleeding or thrombosis ? ?Cathrine Muster, PharmD ?PGY2 Cardiology Pharmacy Resident ?Phone: [phone] ?08/12/2021  10:28 PM ? ?Please check [email or web address] for unit-specific pharmacy phone numbers. ? ? ? ? ?

## 2021-08-12 NOTE — Progress Notes (Signed)
EEG complete - results pending 

## 2021-08-12 NOTE — Plan of Care (Signed)

## 2021-08-13 ENCOUNTER — Inpatient Hospital Stay (HOSPITAL_COMMUNITY): Payer: Medicare Other

## 2021-08-13 DIAGNOSIS — R4182 Altered mental status, unspecified: Secondary | ICD-10-CM

## 2021-08-13 DIAGNOSIS — Z515 Encounter for palliative care: Secondary | ICD-10-CM

## 2021-08-13 DIAGNOSIS — I351 Nonrheumatic aortic (valve) insufficiency: Secondary | ICD-10-CM | POA: Diagnosis not present

## 2021-08-13 DIAGNOSIS — I361 Nonrheumatic tricuspid (valve) insufficiency: Secondary | ICD-10-CM | POA: Diagnosis not present

## 2021-08-13 DIAGNOSIS — Z7189 Other specified counseling: Secondary | ICD-10-CM

## 2021-08-13 DIAGNOSIS — G9341 Metabolic encephalopathy: Secondary | ICD-10-CM | POA: Diagnosis not present

## 2021-08-13 LAB — GLUCOSE, CAPILLARY
Glucose-Capillary: 97 mg/dL (ref 70–99)
Glucose-Capillary: 121 mg/dL — ABNORMAL HIGH (ref 70–99)
Glucose-Capillary: 112 mg/dL — ABNORMAL HIGH (ref 70–99)
Glucose-Capillary: 96 mg/dL (ref 70–99)
Glucose-Capillary: 101 mg/dL — ABNORMAL HIGH (ref 70–99)
Glucose-Capillary: 106 mg/dL — ABNORMAL HIGH (ref 70–99)
Glucose-Capillary: 68 mg/dL — ABNORMAL LOW (ref 70–99)
Glucose-Capillary: 107 mg/dL — ABNORMAL HIGH (ref 70–99)

## 2021-08-13 LAB — ECHOCARDIOGRAM COMPLETE
AR max vel: 2.39 cm2
AV Area VTI: 2.09 cm2
AV Area mean vel: 2.32 cm2
AV Mean grad: 3 mmHg
AV Peak grad: 5.1 mmHg
Ao pk vel: 1.13 m/s
Area-P 1/2: 4.71 cm2
Height: 67 in
P 1/2 time: 487 msec
S' Lateral: 6.8 cm
Weight: 3008.84 oz

## 2021-08-13 LAB — CBC
HCT: 37.3 % — ABNORMAL LOW (ref 39.0–52.0)
Hemoglobin: 11.6 g/dL — ABNORMAL LOW (ref 13.0–17.0)
MCH: 27.8 pg (ref 26.0–34.0)
MCHC: 31.1 g/dL (ref 30.0–36.0)
MCV: 89.2 fL (ref 80.0–100.0)
Platelets: 106 10*3/uL — ABNORMAL LOW (ref 150–400)
RBC: 4.18 MIL/uL — ABNORMAL LOW (ref 4.22–5.81)
RDW: 14.4 % (ref 11.5–15.5)
WBC: 3.9 10*3/uL — ABNORMAL LOW (ref 4.0–10.5)
nRBC: 0 % (ref 0.0–0.2)

## 2021-08-13 LAB — APTT
aPTT: 47 seconds — ABNORMAL HIGH (ref 24–36)
aPTT: 82 s — ABNORMAL HIGH (ref 24–36)

## 2021-08-13 LAB — HEPATITIS B SURFACE ANTIBODY, QUANTITATIVE: Hep B S AB Quant (Post): 8.7 m[IU]/mL — ABNORMAL LOW (ref >9.9)

## 2021-08-13 LAB — HEMOGLOBIN A1C
Hgb A1c MFr Bld: 4.8 % (ref 4.8–5.6)
Mean Plasma Glucose: 91.06 mg/dL

## 2021-08-13 LAB — HEPARIN LEVEL (UNFRACTIONATED)
Heparin Unfractionated: 0.21 IU/mL — ABNORMAL LOW (ref 0.30–0.70)
Heparin Unfractionated: 0.41 IU/mL (ref 0.30–0.70)

## 2021-08-13 MED ORDER — LOSARTAN POTASSIUM 50 MG PO TABS
25.0000 mg | ORAL_TABLET | Freq: Every day | ORAL | Status: DC
Start: 2021-08-13 — End: 2021-08-14
  Administered 2021-08-13: 25 mg via ORAL
  Filled 2021-08-13: qty 1

## 2021-08-13 MED ORDER — POLYETHYLENE GLYCOL 3350 17 G PO PACK
17.0000 g | PACK | Freq: Every day | ORAL | Status: DC
  Administered 2021-08-13: 17 g via ORAL
  Filled 2021-08-13 (×2): qty 1

## 2021-08-13 MED ORDER — DORZOLAMIDE HCL-TIMOLOL MAL 2-0.5 % OP SOLN
1.0000 [drp] | Freq: Three times a day (TID) | OPHTHALMIC | Status: DC
  Administered 2021-08-13 – 2021-08-16 (×9): 1 [drp] via OPHTHALMIC
  Filled 2021-08-13: qty 10

## 2021-08-13 MED ORDER — METOPROLOL SUCCINATE ER 25 MG PO TB24
25.0000 mg | ORAL_TABLET | Freq: Every day | ORAL | Status: DC
  Filled 2021-08-13: qty 1

## 2021-08-13 MED ORDER — CINACALCET HCL 30 MG PO TABS
30.0000 mg | ORAL_TABLET | Freq: Every day | ORAL | Status: DC
  Administered 2021-08-13 – 2021-08-16 (×4): 30 mg via ORAL
  Filled 2021-08-13 (×4): qty 1

## 2021-08-13 MED ORDER — ROSUVASTATIN CALCIUM 5 MG PO TABS
10.0000 mg | ORAL_TABLET | Freq: Every day | ORAL | Status: DC
  Administered 2021-08-13 – 2021-08-16 (×4): 10 mg via ORAL
  Filled 2021-08-13 (×4): qty 2

## 2021-08-13 MED ORDER — ATROPINE SULFATE 1 % OP SOLN
1.0000 [drp] | Freq: Three times a day (TID) | OPHTHALMIC | Status: DC
  Administered 2021-08-13 – 2021-08-16 (×9): 1 [drp] via OPHTHALMIC
  Filled 2021-08-13: qty 2

## 2021-08-13 MED ORDER — BRIMONIDINE TARTRATE 0.2 % OP SOLN
1.0000 [drp] | Freq: Three times a day (TID) | OPHTHALMIC | Status: DC
  Administered 2021-08-13 – 2021-08-16 (×9): 1 [drp] via OPHTHALMIC
  Filled 2021-08-13: qty 5

## 2021-08-13 MED ORDER — SEVELAMER CARBONATE 800 MG PO TABS
1600.0000 mg | ORAL_TABLET | Freq: Three times a day (TID) | ORAL | Status: DC
  Administered 2021-08-14 – 2021-08-16 (×6): 1600 mg via ORAL
  Filled 2021-08-13 (×6): qty 2

## 2021-08-13 MED ORDER — NITROGLYCERIN 0.4 MG SL SUBL: 0.4000 mg | SUBLINGUAL_TABLET | SUBLINGUAL | Status: DC | PRN

## 2021-08-13 MED ORDER — SENNOSIDES-DOCUSATE SODIUM 8.6-50 MG PO TABS
1.0000 | ORAL_TABLET | Freq: Two times a day (BID) | ORAL | Status: DC
  Administered 2021-08-13 (×2): 1 via ORAL
  Filled 2021-08-13 (×3): qty 1

## 2021-08-13 MED ORDER — APIXABAN 5 MG PO TABS
5.0000 mg | ORAL_TABLET | Freq: Two times a day (BID) | ORAL | Status: DC
  Administered 2021-08-13 – 2021-08-14 (×3): 5 mg via ORAL
  Filled 2021-08-13 (×3): qty 1

## 2021-08-13 NOTE — Consult Note (Signed)
?Palliative Medicine Inpatient Consult Note ? ?Consulting Provider: Deatra James, MD ? ?Reason for consult:   ?Palliative Care Consult Services Symptom Management Consult  ? Palliative Medicine Consult  ?Reason for Consult? To determine goals of care, chronic comorbidities including ESRD, severe CHF ejection fraction 10-20%  ? ?HPI:  ?Per intake H&P --> Michael Deleonis a 66 year old male with medical history of ESRD on HD, hypertension, chronic HFrEF, hyperlipidemia was admitted with altered mental status.  Patient has been weak and wobbly for past few days and had several falls.  Patient's weakness progressed over the past 3 days and he became more somnolent and confused. ?Patient missed his hemodialysis on Tuesday.  He was started on acyclovir for herpes keratitis by ophthalmology. ? ?Palliative care was asked to get involved to further discuss goals of care in the setting of multiple chronic co-morbidities. ? ?Clinical Assessment/Goals of Care: ? ?*Please note that this is a verbal dictation therefore any spelling or grammatical errors are due to the "[place]" system interpretation. ? ?I have reviewed medical records including EPIC notes, labs and imaging, received report from bedside RN, assessed the patient.  ?  ?I met with Michael Michael [name], Michael Michael [name] to further discuss diagnosis prognosis, GOC, EOL wishes, disposition and options. ?  ?I introduced Palliative Medicine as specialized medical care for people living with serious illness. It focuses on providing relief from the symptoms and stress of a serious illness. The goal is to improve quality of life for both the patient and the family. ? ?Medical History Review and Understanding: ? ?I reviewed with Michael Michael [name] medical history of ESRD which he has had for 15 years. Discussed that he has significant diastolic heart failure. Reviewed the various staged of heart failure in relation to the NYHA classification system. I shared that  when a person gets to point where activity is severely compromised by symptoms they are at the final phases of the disease. ? ?Discussed the circumstances surrounding Michael Michael [name]'s hospitalization and is ongoing AMS. Michael Michael [name] expresses that it is a bit improved though he remains to intermittently drift off to sleep. ? ?Social History: ? ?Michael Michael [name] is from [place], [place]. He has been married to his Michael [name] for 12 years. He has three sons and "many" grandchildren. He use to work at [employer].Michael Michael [name] has not worked in the past 15 years as he has been disabled from ESRD. He is someone who enjoys fishing, car shows, and watching television. He is not a man of overt faith. ? ?Functional and Nutritional State: ? ?Michael [name] was fully functional prior to admission and driving himself to HD treatments. ? ?He had a robust appetite leading up to admission.  ? ?Advance Directives: ? ?A detailed discussion was had today regarding advanced directives.  Patient and his Michael [name] have never completed these. A copy was given for review and completion.  ? ?Code Status & Discussion: ? ?Discussed cardiopulmonary resuscitation status. Patients Michael [name] shares that she had already had this conversation today. She expresses that Michael Michael [name] has been adamant about wanting any and all interventions in the past. I brought up the point of being unable to repair his heart failure and that he would likely fair very poorly in a CODE. Patients Michael [name] is assertive in the desire to continue with present interventions.  ? ?She is open to ongoing conversations moving forward, she is open to OP Palliative support. ? ?A MOST, Advance Directives, and Hard Choices for loving people booklet was provided for review.  ? ?  Goals at this time are get Michael Michael [name] back to his baseline level of function, I shared this can often be fraught with challenges in the setting of a complex delirium.  ? ?Discussed the importance of continued conversation with family and their  medical providers regarding  overall plan of care and treatment options, ensuring decisions are within the context of the patients values and GOCs. ? ?Decision Maker: ?Michael Michael [name] (spouse) [phone] ? ?SUMMARY OF RECOMMENDATIONS   ?Full Code/Full Scope of Care --> Reviewed patients severe Heart Failure though Michael [name] is clear about this being their wish for the time being ? ?Patients spouse wants for him to improve and is not at a point whereby she wants to hear anything otherwise ? ?Advance Directives provided for review and completion ? ?Ongoing incremental Palliative Support ? ?Code Status/Advance Care Planning: ?FULL CODE ? ?Palliative Prophylaxis:  ?Aspiration, Bowel Regimen, Delirium Protocol, Frequent Pain Assessment, Oral Care, Palliative Wound Care, and Turn Reposition ? ?Additional Recommendations (Limitations, Scope, Preferences): ?Full Scope Treatment ? ?Psycho-social/Spiritual:  ?Desire for further Chaplaincy support: No ?Additional Recommendations: Education on heart failure severity - what to expect ?  ?Prognosis: Unclear though multiple chronic co-morbidities place him at a high 12 month mortality risk ? ?Discharge Planning: Discharge plan unclear presently.  ? ?Vitals:  ? 08/13/21 0810 08/13/21 1257  ?BP: 113/72 105/75  ?Pulse: 83 63  ?Resp: 19 16  ?Temp: 98.3 ?F (36.8 ?C) 98 ?F (36.7 ?C)  ?SpO2: 100% 97%  ? ? ?Intake/Output Summary (Last 24 hours) at 08/13/2021 1343 ?Last data filed at 08/13/2021 1200 ?Gross per 24 hour  ?Intake 1409.65 ml  ?Output 1600 ml  ?Net -190.35 ml  ? ?Last Weight  Most recent update: 08/12/2021  2:33 PM  ? ? Weight  ?85.3 kg (188 lb 0.8 oz)  ?      ? ?  ? ?Gen:  Older AA M in NAD ?HEENT: moist mucous membranes ?CV: Regular rate and rhythm ?PULM: On 2LPM [place], intermittent coughing ?ABD: soft/nontender ?EXT: No edema ?Neuro: Alert and oriented x2 ? ?PPS: 50% ? ? ?This conversation/these recommendations were discussed with patient primary care team, Dr. Roger Shelter ? ?MDM  High ?______________________________________________________ ?[name] ?[facility] Team ?Team Cell Phone: [phone] ?Please utilize secure chat with additional questions, if there is no response within 30 minutes please call the above phone number ? ?Palliative Medicine Team providers are available by phone from 7am to 7pm daily and can be reached through the team cell phone.  ?Should this patient require assistance outside of these hours, please call the patient's attending physician. ? ? ?

## 2021-08-13 NOTE — Progress Notes (Signed)
?PROGRESS NOTE ? ? ? ?Patient: Michael [name].                            PCP: [name], MD                    ?DOB: [birth year]            DOA: 08/11/2021 ?BSW:[id number]             DOS: 08/13/2021, 12:23 PM ? ? LOS: 2 days  ? ?Date of Service: The patient was seen and examined on 08/13/2021 ? ?Subjective:  ? ?The patient was seen and examined this morning. ?Hemodynamically stable, multiple bowel movements, more awake this a.m., following some commands per wife still not at baseline. ? ?Brief Narrative:  ? ? ?Michael Newman Jr.is a 66 year old male with medical history of ESRD on [name], Michael [name], Michael [name], Michael [name].  Patient has been weak and wobbly for past few days and had several falls.  Patient's weakness progressed over the past 3 days and he became more somnolent and confused. ?Patient missed his hemodialysis on Tuesday.  He was started on acyclovir for herpes keratitis by ophthalmology. ?Nephrology was consulted, transferred to [facility] for hemodialysis. ? ? ? ? ?Assessment & Plan:  ? ?Principal Problem: ?  Acute metabolic encephalopathy ?Active Problems: ?  Anemia of Michael disease ?  Essential Michael [name] ?  End stage renal disease ([place]) ?  Michael systolic heart failure ([place]) ?  Michael ?  Herpes keratitis ?  Hypoglycemia ?  Hyperkalemia ? ? ? ? ?Acute metabolic encephalopathy ?-Unknown etiology, likely multifactorial (acyclovir toxicity, hypoglycemia,? hyperammonemia, hepatic encephalopathy, uremia missed hemodialysis) ?-CT head unremarkable, MRI negative for acute intracranial abnormality ?-Creatinine elevated to 12.58, BUN 50; missed hemodialysis on Tuesday ?-[name] post hemodialysis yesterday 08/12/2021 ?-?  Acyclovir toxicity; as per nephrology it can build up in ESRD patients ?-Ammonia level 25-normal ?- transferred to [facility] for hemodialysis ? ?-No signs of pain, sepsis-he is afebrile with normal WBC count, chest x-ray is  unremarkable, vitals are stable-does not seem to be infectious etiology ? ?ESRD ?-Patient on hemodialysis Tuesday Thursday and Saturday via [facility] ?-He missed last 2 of his treatments ?-S/p hemodialysis 08/12/2021 ? ? ? ?Mild hyperkalemia ?-Potassium is 5.4 >>  ?-We will get hemodialysis today as above ? ?Hypoglycemia ?-Blood glucose has been low, likely from poor p.o. intake ?-Encourage p.o. intake ?--No history of diabetes, CBG has improved, will switch to every 4 hours ? ?Herpes keratitis ?-Patient has been on acyclovir for Michael dendritic herpes keratitis ?-Acyclovir on hold as per nephrology as it's level can build up in ESRD patients ? ?Michael systolic CHF ?-Echocardiogram from December 2022 shows less than 20% EF as per echo , right ventricle function is severely reduced ?-Chest x-ray showed mild cardiac enlargement, question pericardial effusion ?-We will obtain echocardiogram ?-Considering consulting cardiology for evaluation ? ?Atrial fibrillation ?-Stable, ?-Patient has permanent atrial fibrillation ?-Heart rate controlled ?-He takes Eliquis at home for anticoagulation ?-He is on metoprolol at home, will start IV metoprolol 2.5 mg every 8 hours as needed for heart rate greater than 100 ?-Eliquis -restarting ?Eliquis was on hold, was on heparin due to altered mental [name] ? ? ?Michael [name] ?-Blood pressure is stable ?-Antihypertensive medications on hold ? ? ? ?--------------------------------------------------------------------------------------------------------------- ? ?DVT prophylaxis: Scds/heparin drip>> will be switched to p.o. Eliquis ?  ? ? ? ?Code [name]:  Code [name]: Full Code ? ?Family Communication: Wife present at bedside ?The above findings and plan of care has been discussed with patient (and family)  in detail,  ?they expressed understanding and agreement of above. ?-Advance care planning has been discussed.  ? ?Admission [name]:   ?[name] is: Inpatient ?Remains inpatient appropriate  because: Continue needing treatment for encephalopathy, ? ? ? ? ?Procedures:  ? ?No admission procedures for hospital encounter. ? ? ?Antimicrobials:  ?Anti-infectives (From admission, onward)  ? ? None  ? ?  ? ? ? ?Medication:  ? Chlorhexidine Gluconate Cloth  6 each Topical Q0600  ? dextrose  1 Tube Oral Q6H  ? polyethylene glycol  17 g Oral Daily  ? senna-docusate  1 tablet Oral BID  ? ? ?metoprolol tartrate ? ? ?Objective:  ? ?Vitals:  ? 08/12/21 1940 08/12/21 2307 08/13/21 0408 08/13/21 0810  ?BP: 109/74 107/74 109/71 113/72  ?Pulse: 87 82 87 83  ?Resp: '20 20 18 19  '$ ?Temp: 98.9 ?F (37.2 ?C) 98.7 ?F (37.1 ?C) 98.5 ?F (36.9 ?C) 98.3 ?F (36.8 ?C)  ?TempSrc: Oral Oral Oral Oral  ?SpO2: 97% 98% 99% 100%  ?Weight:      ?Height:      ? ? ?Intake/Output Summary (Last 24 hours) at 08/13/2021 1223 ?Last data filed at 08/13/2021 1030 ?Gross per 24 hour  ?Intake 1103.88 ml  ?Output 1600 ml  ?Net -496.12 ml  ? ?Filed Weights  ? 08/11/21 1021 08/12/21 0936 08/12/21 1403  ?Weight: 89.4 kg 86.9 kg 85.3 kg  ? ? ? ?Examination:  ? ?Physical Exam  ?Constitution: More awake, alert -this morning ?Psychiatric:   Normal and stable mood and affect, cognition intact,   ?HEENT:        Normocephalic, PERRL, otherwise with in Normal limits  ?Chest:         Chest symmetric ?Cardio vascular:  S1/S2, RRR, No murmure, No Rubs or Gallops  ?pulmonary: Clear to auscultation bilaterally, respirations unlabored, negative wheezes / crackles ?Abdomen: Soft, non-tender, non-distended, bowel sounds,no masses, no organomegaly ?Muscular skeletal: Limited exam - in bed, able to move all 4 extremities,   ?Neuro: CNII-XII intact. , normal motor and sensation, reflexes intact  ?Extremities: No pitting edema lower extremities, +2 pulses  ?Skin: Dry, warm to touch, negative for any Rashes, No open wounds ?Wounds: per nursing  documentation ? ? ?------------------------------------------------------------------------------------------------------------------------------------------ ?  ? ?LABs:  ? ?  Latest Ref Rng & Units 08/13/2021  ?  2:12 AM 08/12/2021  ?  4:30 AM 08/11/2021  ?  4:07 PM  ?CBC  ?WBC 4.0 - 10.5 K/uL 3.9   5.6     ?Hemoglobin 13.0 - 17.0 g/dL 11.6   12.8   12.9    ?Hematocrit 39.0 - 52.0 % 37.3   42.0   38.0    ?Platelets 150 - 400 K/uL 106   144     ? ? ?  Latest Ref Rng & Units 08/12/2021  ?  4:30 AM 08/11/2021  ?  4:07 PM 08/11/2021  ?  1:00 PM  ?CMP  ?Glucose 70 - 99 mg/dL 84   56   65    ?BUN 8 - 23 mg/dL 50   42   44    ?Creatinine 0.61 - 1.24 mg/dL 12.58   14.20   13.89    ?Sodium 135 - 145 mmol/L 138   136   137    ?Potassium 3.5 - 5.1 mmol/L 5.4   5.2   6.2    ?  Chloride 98 - 111 mmol/L 100   99   99    ?CO2 22 - 32 mmol/L 24    28    ?Calcium 8.9 - 10.3 mg/dL 9.2    9.2    ?Total Protein 6.5 - 8.1 g/dL 6.2    5.9    ?Total Bilirubin 0.3 - 1.2 mg/dL 0.9    1.1    ?Alkaline Phos 38 - 126 U/L 28    26    ?AST 15 - 41 U/L 12    17    ?ALT 0 - 44 U/L 6    6    ? ? ? ? ? ?Micro Results ?Recent Results (from the past 240 hour(s))  ?Resp Panel by RT-PCR (Flu A&B, Covid) Nasopharyngeal Swab     [name]: None  ? Collection Time: 08/11/21  3:45 PM  ? Specimen: Nasopharyngeal Swab; Nasopharyngeal(NP) swabs in vial transport medium  ?Result Value Ref Range [name]  ? SARS Coronavirus 2 by RT PCR NEGATIVE NEGATIVE Final  ?  Comment: (NOTE) ?SARS-CoV-2 target nucleic acids are NOT DETECTED. ? ?The SARS-CoV-2 RNA is generally detectable in upper respiratory ?specimens during the acute phase of infection. The lowest ?concentration of SARS-CoV-2 viral copies this assay can detect is ?138 copies/mL. A negative result does not preclude SARS-Cov-2 ?infection and should not be used as the sole basis for treatment or ?other patient management decisions. A negative result may occur with  ?improper specimen collection/handling, submission of  specimen other ?than nasopharyngeal swab, presence of viral mutation(s) within the ?areas targeted by this assay, and inadequate number of viral ?copies(<138 copies/mL). A negative result must be combined with ?clinical observations, patient history, and epidemiological ?information. The expected result is Negative. ? ?Fact Sheet for Patients:  ?htt

## 2021-08-13 NOTE — Progress Notes (Signed)
ANTICOAGULATION CONSULT NOTE ? ?Pharmacy Consult for IV  ?Indication: atrial fibrillation ? ?Allergies  ?Allergen Reactions  ? Colchicine Other (See Comments)  ?  Other reaction(s): foot pain  ? Indapamide Other (See Comments)  ?  unknown  ? Lipitor [Atorvastatin] Itching  ?  Leg pain  ? Viagra [Sildenafil] Other (See Comments)  ?  Feels weird - Levitra. - headache  ? Zyloprim [Allopurinol] Other (See Comments)  ?  foot pain  ? ? ?Patient Measurements: ?Height: '5\' 7"'$  (170.2 cm) ?Weight: 85.3 kg (188 lb 0.8 oz) ?IBW/kg (Calculated) : 66.1 ?Heparin Dosing Weight: 85 kg ? ?Vital Signs: ?Temp: 98.7 ?F (37.1 ?C) (04/13 2307) ?Temp Source: Oral (04/13 2307) ?BP: 107/74 (04/13 2307) ?Pulse Rate: 82 (04/13 2307) ? ?Labs: ?Recent Labs  ?  08/11/21 ?1300 08/11/21 ?1600 08/11/21 ?1607 08/11/21 ?1840 08/12/21 ?0430 08/13/21 ?9735  ?HGB 12.6*  --  12.9*  --  12.8* 11.6*  ?HCT 41.5  --  38.0*  --  42.0 37.3*  ?PLT 108*  --   --   --  144* PENDING  ?APTT  --   --   --   --   --  47*  ?HEPARINUNFRC  --   --   --   --   --  0.21*  ?CREATININE 13.89*  --  14.20*  --  12.58*  --   ?TROPONINIHS  --  89*  --  75*  --   --   ? ? ? ?Estimated Creatinine Clearance: 6.2 mL/min (A) (by C-G formula based on SCr of 12.58 mg/dL (H)). ? ? ?Medical History: ?Past Medical History:  ?Diagnosis Date  ? Atrial arrhythmia   ? atrial tachycardia with variable AV conduction versus atypical aflutter 01/10/19, rate control (02/06/19)  ? Cataract   ? Dyspnea   ? on exertion  ? ESRD (end stage renal disease) ([place])   ? Hemo- MWF, Polycystic kidney disease  ? Fatigue   ? History of kidney stones   ? removal of stone- cysto  ? Hyperlipidemia   ? Hyperparathyroidism, secondary renal ([place])   ? Hypertension   ? Hypoxemia 12/12/2013  ? Nonischemic cardiomyopathy ([place])   ? Er 25% 2015, 55 % 2013  ? OSA on CPAP   ? no longer using cpap  ? OSA on CPAP 03/24/2014  ? Pneumonia   ? 2015ish  ? Prostate cancer ([place])   ? Ventricular tachycardia//Freq PVCs   ? Wears glasses    ? ? ?Medications:  ?Medications Prior to Admission  ?Medication Sig Dispense Refill Last Dose  ? acyclovir (ZOVIRAX) 400 MG tablet Take 400 mg by mouth 2 (two) times daily.   Past Week  ? apixaban (ELIQUIS) 5 MG TABS tablet Take 1 tablet by mouth twice daily (Patient taking differently: Take 5 mg by mouth daily.) 180 tablet 1 Past Week at unk last dose  ? atropine 1 % ophthalmic solution Place 1 drop into the left eye in the morning and at bedtime.   Past Week  ? brimonidine (ALPHAGAN) 0.2 % ophthalmic solution Place 1 drop into the left eye 3 (three) times daily. 10 mL 2 Past Week  ? cinacalcet (SENSIPAR) 30 MG tablet Take 30 mg by mouth daily.   Past Week  ? clotrimazole-betamethasone (LOTRISONE) cream Apply 1 application topically 2 (two) times daily. 45 g 2 Past Week  ? dorzolamide-timolol (COSOPT) 22.3-6.8 MG/ML ophthalmic solution Place 1 drop into the left eye 3 (three) times daily. 10 mL 2 Past Week  ?  losartan (COZAAR) 25 MG tablet Take 0.5 tablets (12.5 mg total) by mouth daily. (Patient taking differently: Take 25 mg by mouth daily.) 45 tablet 3 Past Week  ? Loteprednol Etabonate (LOTEMAX SM) 0.38 % GEL Place 1 drop into the left eye every 2 (two) hours. 5 g 3 Past Week  ? metoprolol succinate (TOPROL XL) 25 MG 24 hr tablet Take 1 tablet (25 mg total) by mouth at bedtime. 90 tablet 3 Past Week at 1800  ? nitroGLYCERIN (NITROSTAT) 0.4 MG SL tablet Place 1 tablet (0.4 mg total) under the tongue every 5 (five) minutes x 3 doses as needed for chest pain. 30 tablet 12 unk  ? rosuvastatin (CRESTOR) 10 MG tablet Take 1 tablet (10 mg total) by mouth daily. 90 tablet 3 Past Week  ? sevelamer carbonate (RENVELA) 800 MG tablet Take 1,600 mg by mouth 3 (three) times daily with meals.   Past Week  ? sodium chloride (MURO 128) 2 % ophthalmic solution Place 1 drop into the left eye 4 (four) times daily. 15 mL 2 Past Week  ? spironolactone (ALDACTONE) 25 MG tablet Take 25 mg by mouth at bedtime.   Past Week  ?  acetaZOLAMIDE (DIAMOX) 250 MG tablet Take 1 tablet (250 mg total) by mouth in the morning and at bedtime. (Patient not taking: Reported on 08/12/2021) 60 tablet 0 Not Taking  ? ?Scheduled:  ? Chlorhexidine Gluconate Cloth  6 each Topical Q0600  ? dextrose  1 Tube Oral Q6H  ? ? ?Assessment: ?78 yoM with PMH ESRD on HD TTS, HTN, LVEF < 20%, permanent Afib on Eliquis with last dose thought to be 4/9, admitted 4/12 with AMS, weakness, & falls. Missed last HD session 4/11. Pharmacy has not be consulted to dose heparin while Eliquis on hold given possible need for lumbar puncture. CBC stable with platelets on the lower side.  ? ?Heparin level subtherapeutic: 0.21, no infusion issues or s/sx of bleeding reported by RN ? ?Goal of Therapy: ?Heparin level 0.3-0.7 units/ml ?Monitor platelets by anticoagulation protocol: Yes ? ?Plan: ?Increase heparin gtt to 1450 units/hr IV infusion ?Check aPTT & heparin level 8 hrs after start ?Daily CBC, daily heparin level once stable; aPTT as needed if/while HL elevated from recent DOAC ?Monitor for signs of bleeding or thrombosis ? ?Georga Bora, PharmD ?Clinical Pharmacist ?08/13/2021 3:35 AM ?Please check AMION for all [place] numbers ? ? ? ? ? ?

## 2021-08-13 NOTE — Hospital Course (Signed)
?  Michael [name].is a 66 year old male with medical history of ESRD on HD, hypertension, chronic HFrEF, hyperlipidemia was admitted with altered mental status.  Patient has been weak and wobbly for past few days and had several falls.  Patient's weakness progressed over the past 3 days and he became more somnolent and confused. ?Patient missed his hemodialysis on Tuesday.  He was started on acyclovir for herpes keratitis by ophthalmology. ?Nephrology was consulted, transferred to [facility] for hemodialysis. ? ?

## 2021-08-13 NOTE — Progress Notes (Signed)
Subjective: Seen in room with wife present, somewhat lethargic then with further questioning is becoming more alert not quite at baseline.  Hemodialysis yesterday on schedule 2.1 l UF tolerated ? ?Objective ?Vital signs in last 24 hours: ?Vitals:  ? 08/12/21 2307 08/13/21 0408 08/13/21 0810 08/13/21 1257  ?BP: 107/74 109/71 113/72 105/75  ?Pulse: 82 87 83 63  ?Resp: '20 18 19 16  '$ ?Temp: 98.7 ?F (37.1 ?C) 98.5 ?F (36.9 ?C) 98.3 ?F (36.8 ?C) 98 ?F (36.7 ?C)  ?TempSrc: Oral Oral Oral Oral  ?SpO2: 98% 99% 100% 97%  ?Weight:      ?Height:      ? ?Weight change: -2.458 kg ? ?Physical Exam: ?General: Somewhat somnolent elderly male but arousable to voice, NAD ?Heart: RRR no appreciated MRG ?Lungs: CTA anteriorly nonlabored breathing ?Abdomen: NABS, S, NTND ?Extremities: No pedal edema  ?dialysis Access: Right IJ PermCath nontender dressing intact ? ?OP HD= NW ,TTS but missed last Thurs and this Tuesday  EDW 86 ( but seems like it needs to be lower). 4 hours and 15 minutes-  profile #2 ?HD Bath 2/2, Dialyzer 180, Heparin no. Access [facility]-  refusing other. Calcitriol 2 mcg tiw-  last hgb 12.6 ?  ? ?Problem/Plan: ?Acute metabolic encephalopathy= multifactorial= possible acyclovir toxicity, hypoglycemia, uremia missed dialysis x2= neurology consulted and admit work-up, per wife yesterday after dialysis was back to his normal self and then became  lethargic. ?ESRD -HD TTS on schedule next dialysis tomorrow no sign of infection continue to use PermCath.(Refusing other accesses) BUN 50 creatinine 12.6 yesterday K5.4, next dialysis tomorrow on schedule ?Anemia -Hgb 11.6 no ESA needs ?Secondary hyperparathyroidism -calcium stable, vitamin D on dialysis, Renvela binder, Sensipar continue follow-up ca phos labs ?HTN/volume -holding BP meds with slightly low BP no volume excess ? ?Ernest Haber, PA-C ?[facility] ?Beeper [phone] ?08/13/2021,2:39 PM ? LOS: 2 days  ? ?Labs: ?Basic Metabolic Panel: ?Recent Labs  ?Lab  08/11/21 ?1300 08/11/21 ?1607 08/12/21 ?0430  ?NA 137 136 138  ?K 6.2* 5.2* 5.4*  ?CL 99 99 100  ?CO2 28  --  24  ?GLUCOSE 65* 56* 84  ?BUN 44* 42* 50*  ?CREATININE 13.89* 14.20* 12.58*  ?CALCIUM 9.2  --  9.2  ? ?Liver Function Tests: ?Recent Labs  ?Lab 08/11/21 ?1300 08/12/21 ?0430  ?AST 17 12*  ?ALT 6 6  ?ALKPHOS 26* 28*  ?BILITOT 1.1 0.9  ?PROT 5.9* 6.2*  ?ALBUMIN 3.0* 3.3*  ? ?No results for input(s): LIPASE, AMYLASE in the last 168 hours. ?Recent Labs  ?Lab 08/11/21 ?1600 08/12/21 ?0836  ?AMMONIA 20 25  ? ?CBC: ?Recent Labs  ?Lab 08/11/21 ?1300 08/11/21 ?1607 08/12/21 ?0430 08/13/21 ?0962  ?WBC 4.2  --  5.6 3.9*  ?NEUTROABS 3.1  --   --   --   ?HGB 12.6* 12.9* 12.8* 11.6*  ?HCT 41.5 38.0* 42.0 37.3*  ?MCV 90.4  --  91.7 89.2  ?PLT 108*  --  144* 106*  ? ?Cardiac Enzymes: ?No results for input(s): CKTOTAL, CKMB, CKMBINDEX, TROPONINI in the last 168 hours. ?CBG: ?Recent Labs  ?Lab 08/13/21 ?0143 08/13/21 ?0409 08/13/21 ?8366 08/13/21 ?0829 08/13/21 ?1300  ?GLUCAP 97 121* 112* 96 101*  ? ? ?SMedications: ? dextrose 50 mL/hr at 08/13/21 1200  ? ? apixaban  5 mg Oral BID  ? atropine  1 drop Left Eye TID  ? brimonidine  1 drop Left Eye TID  ? Chlorhexidine Gluconate Cloth  6 each Topical Q0600  ? cinacalcet  30 mg  Oral Daily  ? dextrose  1 Tube Oral Q6H  ? dorzolamide-timolol  1 drop Left Eye TID  ? losartan  25 mg Oral Daily  ? metoprolol succinate  25 mg Oral QHS  ? polyethylene glycol  17 g Oral Daily  ? rosuvastatin  10 mg Oral Daily  ? senna-docusate  1 tablet Oral BID  ? sevelamer carbonate  1,600 mg Oral TID WC  ? ? ? ? ?

## 2021-08-13 NOTE — Progress Notes (Signed)
ANTICOAGULATION CONSULT NOTE ? ?Pharmacy Consult :  Transition from IV heparini  back to Apixaban ?Indication: atrial fibrillation ? ?Allergies  ?Allergen Reactions  ? Colchicine Other (See Comments)  ?  Other reaction(s): foot pain  ? Indapamide Other (See Comments)  ?  unknown  ? Lipitor [Atorvastatin] Itching  ?  Leg pain  ? Viagra [Sildenafil] Other (See Comments)  ?  Feels weird - Levitra. - headache  ? Zyloprim [Allopurinol] Other (See Comments)  ?  foot pain  ? ? ?Patient Measurements: ?Height: '5\' 7"'$  (170.2 cm) ?Weight: 85.3 kg (188 lb 0.8 oz) ?IBW/kg (Calculated) : 66.1 ?Heparin Dosing Weight: 85 kg ? ?Vital Signs: ?Temp: 98 ?F (36.7 ?C) (04/14 1257) ?Temp Source: Oral (04/14 1257) ?BP: 105/75 (04/14 1257) ?Pulse Rate: 63 (04/14 1257) ? ?Labs: ?Recent Labs  ?  08/11/21 ?1300 08/11/21 ?1600 08/11/21 ?1607 08/11/21 ?1840 08/12/21 ?0430 08/13/21 ?1638 08/13/21 ?1151  ?HGB 12.6*  --  12.9*  --  12.8* 11.6*  --   ?HCT 41.5  --  38.0*  --  42.0 37.3*  --   ?PLT 108*  --   --   --  144* 106*  --   ?APTT  --   --   --   --   --  47* 82*  ?HEPARINUNFRC  --   --   --   --   --  0.21* 0.41  ?CREATININE 13.89*  --  14.20*  --  12.58*  --   --   ?TROPONINIHS  --  89*  --  75*  --   --   --   ? ? ? ?Estimated Creatinine Clearance: 6.2 mL/min (A) (by C-G formula based on SCr of 12.58 mg/dL (H)). ? ? ?Medical History: ?Past Medical History:  ?Diagnosis Date  ? Atrial arrhythmia   ? atrial tachycardia with variable AV conduction versus atypical aflutter 01/10/19, rate control (02/06/19)  ? Cataract   ? Dyspnea   ? on exertion  ? ESRD (end stage renal disease) ([place])   ? Hemo- MWF, Polycystic kidney disease  ? Fatigue   ? History of kidney stones   ? removal of stone- cysto  ? Hyperlipidemia   ? Hyperparathyroidism, secondary renal ([place])   ? Hypertension   ? Hypoxemia 12/12/2013  ? Nonischemic cardiomyopathy ([place])   ? Er 25% 2015, 55 % 2013  ? OSA on CPAP   ? no longer using cpap  ? OSA on CPAP 03/24/2014  ? Pneumonia   ? 2015ish   ? Prostate cancer ([place])   ? Ventricular tachycardia//Freq PVCs   ? Wears glasses   ? ? ?Medications:  ?Medications Prior to Admission  ?Medication Sig Dispense Refill Last Dose  ? acyclovir (ZOVIRAX) 400 MG tablet Take 400 mg by mouth 2 (two) times daily.   Past Week  ? apixaban (ELIQUIS) 5 MG TABS tablet Take 1 tablet by mouth twice daily (Patient taking differently: Take 5 mg by mouth daily.) 180 tablet 1 Past Week at unk last dose  ? atropine 1 % ophthalmic solution Place 1 drop into the left eye in the morning and at bedtime.   Past Week  ? brimonidine (ALPHAGAN) 0.2 % ophthalmic solution Place 1 drop into the left eye 3 (three) times daily. 10 mL 2 Past Week  ? cinacalcet (SENSIPAR) 30 MG tablet Take 30 mg by mouth daily.   Past Week  ? clotrimazole-betamethasone (LOTRISONE) cream Apply 1 application topically 2 (two) times daily. 45 g 2 Past Week  ?  dorzolamide-timolol (COSOPT) 22.3-6.8 MG/ML ophthalmic solution Place 1 drop into the left eye 3 (three) times daily. 10 mL 2 Past Week  ? losartan (COZAAR) 25 MG tablet Take 0.5 tablets (12.5 mg total) by mouth daily. (Patient taking differently: Take 25 mg by mouth daily.) 45 tablet 3 Past Week  ? Loteprednol Etabonate (LOTEMAX SM) 0.38 % GEL Place 1 drop into the left eye every 2 (two) hours. 5 g 3 Past Week  ? metoprolol succinate (TOPROL XL) 25 MG 24 hr tablet Take 1 tablet (25 mg total) by mouth at bedtime. 90 tablet 3 Past Week at 1800  ? nitroGLYCERIN (NITROSTAT) 0.4 MG SL tablet Place 1 tablet (0.4 mg total) under the tongue every 5 (five) minutes x 3 doses as needed for chest pain. 30 tablet 12 unk  ? rosuvastatin (CRESTOR) 10 MG tablet Take 1 tablet (10 mg total) by mouth daily. 90 tablet 3 Past Week  ? sevelamer carbonate (RENVELA) 800 MG tablet Take 1,600 mg by mouth 3 (three) times daily with meals.   Past Week  ? sodium chloride (MURO 128) 2 % ophthalmic solution Place 1 drop into the left eye 4 (four) times daily. 15 mL 2 Past Week  ?  spironolactone (ALDACTONE) 25 MG tablet Take 25 mg by mouth at bedtime.   Past Week  ? acetaZOLAMIDE (DIAMOX) 250 MG tablet Take 1 tablet (250 mg total) by mouth in the morning and at bedtime. (Patient not taking: Reported on 08/12/2021) 60 tablet 0 Not Taking  ? ?Scheduled:  ? apixaban  5 mg Oral BID  ? atropine  1 drop Left Eye TID  ? brimonidine  1 drop Left Eye TID  ? Chlorhexidine Gluconate Cloth  6 each Topical Q0600  ? cinacalcet  30 mg Oral Daily  ? dextrose  1 Tube Oral Q6H  ? dorzolamide-timolol  1 drop Left Eye TID  ? losartan  25 mg Oral Daily  ? metoprolol succinate  25 mg Oral QHS  ? polyethylene glycol  17 g Oral Daily  ? rosuvastatin  10 mg Oral Daily  ? senna-docusate  1 tablet Oral BID  ? sevelamer carbonate  1,600 mg Oral TID WC  ? ? ?Assessment: ?16 yoM with PMH ESRD on HD TTS, HTN, LVEF < 20%, permanent Afib on Eliquis with last dose thought to be 4/9, admitted 4/12 with AMS, weakness, & falls.  Pharmacy has not be consulted to dose heparin while Eliquis on hold given possible need for lumbar puncture.  ? ?Today 08/13/21: aPTT = 82 , therapeutic on heparin drip 1450 units/hr.  ?Heparin drip now  discontinued by MD and pharmacy consulted to resume Eliquis for h/o nonvalvular Afib.  ?CBC stable with platelets on the lower side, Hgb 11.6.  ? ?Goal of Therapy: ?Heparin level 0.3-0.7 units/ml ?Monitor platelets by anticoagulation protocol: Yes ? ?Plan: ?Heparin discontinued ?Restart Eliquis 5 mg po BID ?Monitor for s/sx of bleeding.  ? ? ?Nicole Cella, RPh ?Clinical Pharmacist ?[phone] ? ?08/13/2021 1:52 PM ?Please check AMION for all [place] numbers ? ? ? ? ? ?

## 2021-08-13 NOTE — Procedures (Signed)
Patient Name: Michael [name].  ?[name]: [id number]  ?Epilepsy Attending: Lora Havens  ?Referring Physician/Provider: Greta Doom, MD ?Date: 08/12/2021 ?Duration: 24.07 mins ? ?Patient history: 66 year old male with encephalopathy of unclear etiology.  EEG to evaluate for seizure. ? ?Level of alertness: Awake ? ?AEDs during EEG study: None ? ?Technical aspects: This EEG study was done with scalp electrodes positioned according to the 10-20 International system of electrode placement. Electrical activity was acquired at a sampling rate of '500Hz'$  and reviewed with a high frequency filter of '70Hz'$  and a low frequency filter of '1Hz'$ . EEG data were recorded continuously and digitally stored.  ? ?Description: No clear posterior dominant rhythm was seen. EEG showed continuous generalized predominantly 5 to 6 Hz theta slowing admixed with intermittent generalized 2 to 3 Hz delta slowing.  Hyperventilation and photic stimulation were not performed.    ? ?ABNORMALITY ?- Continuous slow, generalized ? ?IMPRESSION: ?This study is suggestive of moderate diffuse encephalopathy, nonspecific etiology. No seizures or epileptiform discharges were seen throughout the recording. ? ?Lora Havens  ? ?

## 2021-08-13 NOTE — Progress Notes (Signed)
*  PRELIMINARY RESULTS* ?Echocardiogram ?2D Echocardiogram has been performed. ? ?Arlyss Gandy ?08/13/2021, 9:08 AM ?

## 2021-08-14 DIAGNOSIS — G9341 Metabolic encephalopathy: Secondary | ICD-10-CM | POA: Diagnosis not present

## 2021-08-14 LAB — GLUCOSE, CAPILLARY
Glucose-Capillary: 84 mg/dL (ref 70–99)
Glucose-Capillary: 84 mg/dL (ref 70–99)
Glucose-Capillary: 46 mg/dL — ABNORMAL LOW (ref 70–99)
Glucose-Capillary: 67 mg/dL — ABNORMAL LOW (ref 70–99)
Glucose-Capillary: 126 mg/dL — ABNORMAL HIGH (ref 70–99)
Glucose-Capillary: 97 mg/dL (ref 70–99)
Glucose-Capillary: 63 mg/dL — ABNORMAL LOW (ref 70–99)
Glucose-Capillary: 105 mg/dL — ABNORMAL HIGH (ref 70–99)
Glucose-Capillary: 100 mg/dL — ABNORMAL HIGH (ref 70–99)
Glucose-Capillary: 118 mg/dL — ABNORMAL HIGH (ref 70–99)

## 2021-08-14 LAB — CBC
HCT: 36.6 % — ABNORMAL LOW (ref 39.0–52.0)
Hemoglobin: 11.4 g/dL — ABNORMAL LOW (ref 13.0–17.0)
MCH: 27.7 pg (ref 26.0–34.0)
MCHC: 31.1 g/dL (ref 30.0–36.0)
MCV: 89.1 fL (ref 80.0–100.0)
Platelets: 120 10*3/uL — ABNORMAL LOW (ref 150–400)
RBC: 4.11 MIL/uL — ABNORMAL LOW (ref 4.22–5.81)
RDW: 14.4 % (ref 11.5–15.5)
WBC: 3.7 10*3/uL — ABNORMAL LOW (ref 4.0–10.5)
nRBC: 0 % (ref 0.0–0.2)

## 2021-08-14 LAB — COMPREHENSIVE METABOLIC PANEL
ALT: 6 U/L (ref 0–44)
AST: 13 U/L — ABNORMAL LOW (ref 15–41)
Albumin: 2.5 g/dL — ABNORMAL LOW (ref 3.5–5.0)
Alkaline Phosphatase: 24 U/L — ABNORMAL LOW (ref 38–126)
Anion gap: 7 (ref 5–15)
BUN: 28 mg/dL — ABNORMAL HIGH (ref 8–23)
CO2: 26 mmol/L (ref 22–32)
Calcium: 8.4 mg/dL — ABNORMAL LOW (ref 8.9–10.3)
Chloride: 99 mmol/L (ref 98–111)
Creatinine, Ser: 12.06 mg/dL — ABNORMAL HIGH (ref 0.61–1.24)
GFR, Estimated: 4 mL/min — ABNORMAL LOW (ref >60)
Glucose, Bld: 91 mg/dL (ref 70–99)
Potassium: 4.8 mmol/L (ref 3.5–5.1)
Sodium: 132 mmol/L — ABNORMAL LOW (ref 135–145)
Total Bilirubin: 1.2 mg/dL (ref 0.3–1.2)
Total Protein: 4.9 g/dL — ABNORMAL LOW (ref 6.5–8.1)

## 2021-08-14 MED ORDER — DIPHENHYDRAMINE HCL 50 MG/ML IJ SOLN: 6.2500 mg | Freq: Once | INTRAMUSCULAR | Status: AC

## 2021-08-14 MED ORDER — DEXTROSE 50 % IV SOLN
12.5000 g | Freq: Once | INTRAVENOUS | Status: DC
Start: 2021-08-14 — End: 2021-08-14
  Filled 2021-08-14: qty 50

## 2021-08-14 MED ORDER — APIXABAN 5 MG PO TABS: 5.0000 mg | ORAL_TABLET | Freq: Two times a day (BID) | ORAL | Status: DC

## 2021-08-14 MED ORDER — DEXTROSE 10 % IV SOLN: INTRAVENOUS | Status: DC

## 2021-08-14 MED ORDER — RENA-VITE PO TABS
1.0000 | ORAL_TABLET | Freq: Every day | ORAL | Status: DC
  Administered 2021-08-14 – 2021-08-15 (×2): 1 via ORAL
  Filled 2021-08-14 (×3): qty 1

## 2021-08-14 MED ORDER — DIPHENHYDRAMINE HCL 50 MG/ML IJ SOLN
INTRAMUSCULAR | Status: AC
  Administered 2021-08-14: 6.5 mg via INTRAVENOUS
  Filled 2021-08-14: qty 1

## 2021-08-14 MED ORDER — HEPARIN (PORCINE) 25000 UT/250ML-% IV SOLN
1450.0000 [IU]/h | INTRAVENOUS | Status: DC
  Administered 2021-08-15: 1450 [IU]/h via INTRAVENOUS
  Filled 2021-08-14: qty 250

## 2021-08-14 MED ORDER — DEXTROSE 50 % IV SOLN
25.0000 g | Freq: Once | INTRAVENOUS | Status: AC
  Administered 2021-08-14: 25 g via INTRAVENOUS

## 2021-08-14 NOTE — Progress Notes (Addendum)
?PROGRESS NOTE ? ? ? ?Patient: Michael [name].                            PCP: [name], MD                    ?DOB: 1955-12-03            DOA: 08/11/2021 ?JFH:[id number]             DOS: 08/14/2021, 1:39 PM ? ? LOS: 3 days  ? ?Date of Service: The patient was seen and examined on 08/14/2021 ? ?Subjective:  ? ?The patient was seen and examined... Per nursing his mentation waxes and wanes. ?On my exam he was able to state his name and where he was ?During dialysis he was found confused trying to climb out of the bed stand ? ?Wife confirms that his mentation has been waxing and waning in the past few days ? ? ?Brief Narrative:  ? ? ?Michael [name].is a 66 year old male with medical history of ESRD on HD, hypertension, chronic HFrEF, hyperlipidemia was admitted with altered mental status.  Patient has been weak and wobbly for past few days and had several falls.  Patient's weakness progressed over the past 3 days and he became more somnolent and confused. ?Patient missed his hemodialysis on Tuesday.  He was started on acyclovir for herpes keratitis by ophthalmology. ?Nephrology was consulted, transferred to [facility] for hemodialysis. ? ? ? ? ?Assessment & Plan:  ? ?Principal Problem: ?  Acute metabolic encephalopathy ?Active Problems: ?  Anemia of chronic disease ?  Essential hypertension ?  End stage renal disease ([place]) ?  Chronic systolic heart failure ([place]) ?  Hyperlipidemia ?  Herpes keratitis ?  Hypoglycemia ?  Hyperkalemia ? ? ? ? ?Acute metabolic encephalopathy ?-Mentation waxing weans..  Was confused again during hemodialysis today ? ?-Unknown etiology, likely multifactorial (acyclovir toxicity, hypoglycemia,? hyperammonemia, hepatic encephalopathy, uremia missed hemodialysis) ?-CT head unremarkable, MRI negative for acute intracranial abnormality ?-Creatinine elevated to 12.58, BUN 50; missed hemodialysis on Tuesday ?-Status post hemodialysis yesterday 4/13  , 4/15 today  ?-?  Acyclovir toxicity; as  per nephrology it can build up in ESRD patients ?-Ammonia level 25-normal ? ?-No signs of pain, sepsis-ruled out - he is afebrile with normal WBC count, chest x-ray is unremarkable, vitals are stable-does not seem to be infectious etiology ? ? ?-With this recent history of herpes keratitis, now encephalopathic, hypoglycemia ?Concerning for viral meningitis   ... neurology Dose not think so, Lp canceled  ?(Restarting Eliquis , D/c heprin gtt) ? ? ?ESRD ?-Patient on hemodialysis Tuesday Thursday and Saturday via [facility] ?-He missed last 2 of his treatments ?-S/p hemodialysis 4/13 and today 4/15 ? ?-Status post hemodialysis again today removed 1500 mL fluid- mild hypotensive during dialysis ?-  ?Filed Weights  ? 08/12/21 1403 08/14/21 0736 08/14/21 1154  ?Weight: 85.3 kg 87.4 kg 85.5 kg  ? ? ?Mild hyperkalemia  ?-Potassium is 5.4 >> 4.8 ? ? ?Hypoglycemia ?-Blood glucose has been low, likely from poor p.o. intake ?-CBG 84, 46, 67 ?-Encourage p.o. intake ?--No history of diabetes, CBG has improved, will switch to every 4 hours ?-Started the patient on D10 IVF   ? ?Herpes keratitis ?-Patient has been on Acyclovir for chronic dendritic herpes keratitis ?-Acyclovir on hold as per nephrology as it's level can build up in ESRD patients ?-Concern for viral meningitis pursuing with LP ? ?Chronic systolic  CHF ?-Echocardiogram from December 2022 shows less than 20% EF as per echo , right ventricle function is severely reduced ?-Chest x-ray showed mild cardiac enlargement, question pericardial effusion ?-Echocardiogram :<20%. The left  ?ventricle has severely decreased function. The left ventricle demonstrates  ?global hypokinesis. The left ventricular internal cavity size was severely  ?dilated. Left ventricular  diastolic parameters are consistent with Grade III diastolic dysfunction  (restrictive).  ?--no significant changes compare to past Eecho. ? ? ?Atrial fibrillation ?-Stable, ?-Patient has permanent atrial  fibrillation ?-Heart rate controlled ?-He takes Eliquis at home for anticoagulation ?-He is on metoprolol at home, will start IV metoprolol 2.5 mg every 8 hours as needed for heart rate greater than 100 ?-Eliquis - rstarted ? ? ?Hypertension ?-Blood pressure is stable ?-Antihypertensive medications on hold ? ? ? ?--------------------------------------------------------------------------------------------------------------- ? ?DVT prophylaxis: Scds/heparin drip>> will be switched to p.o. Eliquis ?Place and maintain sequential compression device Start: 08/13/21 1232 Place and maintain sequential compression device Start: 08/13/21 1232 ?apixaban (ELIQUIS) tablet 5 mg  ?apixaban (ELIQUIS) tablet 5 mg  ? ?Code Status:   Code Status: Full Code ? ?Family Communication: Wife present at bedside ?The above findings and plan of care has been discussed with patient (and family)  in detail,  ?they expressed understanding and agreement of above. ?-Advance care planning has been discussed.  ? ?Admission status:   ?Status is: Inpatient ?Remains inpatient appropriate because: Continue needing treatment for encephalopathy, ? ? ? ? ?Procedures:  ? ?No admission procedures for hospital encounter. ? ? ?Antimicrobials:  ?Anti-infectives (From admission, onward)  ? ? None  ? ?  ? ? ? ?Medication:  ? apixaban  5 mg Oral BID  ? atropine  1 drop Left Eye TID  ? brimonidine  1 drop Left Eye TID  ? Chlorhexidine Gluconate Cloth  6 each Topical Q0600  ? cinacalcet  30 mg Oral Daily  ? dextrose  25 g Intravenous Once  ? dorzolamide-timolol  1 drop Left Eye TID  ? metoprolol succinate  25 mg Oral QHS  ? multivitamin  1 tablet Oral QHS  ? polyethylene glycol  17 g Oral Daily  ? rosuvastatin  10 mg Oral Daily  ? senna-docusate  1 tablet Oral BID  ? sevelamer carbonate  1,600 mg Oral TID WC  ? ? ?metoprolol tartrate, nitroGLYCERIN ? ? ?Objective:  ? ?Vitals:  ? 08/14/21 1130 08/14/21 1154 08/14/21 1240 08/14/21 1257  ?BP: 129/74 (!) 89/79 (!) 94/59  103/68  ?Pulse: 82 77 76 77  ?Resp: '19 18 19 20  '$ ?Temp:  98.6 ?F (37 ?C)  98.5 ?F (36.9 ?C)  ?TempSrc:    Oral  ?SpO2:   97% 95%  ?Weight:  85.5 kg    ?Height:      ? ? ?Intake/Output Summary (Last 24 hours) at 08/14/2021 1339 ?Last data filed at 08/14/2021 1154 ?Gross per 24 hour  ?Intake 953.59 ml  ?Output 1500 ml  ?Net -546.41 ml  ? ?Filed Weights  ? 08/12/21 1403 08/14/21 0736 08/14/21 1154  ?Weight: 85.3 kg 87.4 kg 85.5 kg  ? ? ? ?Examination:  ? ?Physical Exam  ?Constitution: More awake, alert -this morning ?Psychiatric:   Normal and stable mood and affect, cognition intact,   ?HEENT:        Normocephalic, PERRL, otherwise with in Normal limits  ?Chest:         Chest symmetric ?Cardio vascular:  S1/S2, RRR, No murmure, No Rubs or Gallops  ?pulmonary: Clear to auscultation bilaterally, respirations unlabored,  negative wheezes / crackles ?Abdomen: Soft, non-tender, non-distended, bowel sounds,no masses, no organomegaly ?Muscular skeletal: Limited exam - in bed, able to move all 4 extremities,   ?Neuro: CNII-XII intact. , normal motor and sensation, reflexes intact  ?Extremities: No pitting edema lower extremities, +2 pulses  ?Skin: Dry, warm to touch, negative for any Rashes, No open wounds ?Wounds: per nursing documentation ? ? ?------------------------------------------------------------------------------------------------------------------------------------------ ?  ? ?LABs:  ? ?  Latest Ref Rng & Units 08/14/2021  ?  1:15 AM 08/13/2021  ?  2:12 AM 08/12/2021  ?  4:30 AM  ?CBC  ?WBC 4.0 - 10.5 K/uL 3.7   3.9   5.6    ?Hemoglobin 13.0 - 17.0 g/dL 11.4   11.6   12.8    ?Hematocrit 39.0 - 52.0 % 36.6   37.3   42.0    ?Platelets 150 - 400 K/uL 120   106   144    ? ? ?  Latest Ref Rng & Units 08/14/2021  ?  7:56 AM 08/12/2021  ?  4:30 AM 08/11/2021  ?  4:07 PM  ?CMP  ?Glucose 70 - 99 mg/dL 91   84   56    ?BUN 8 - 23 mg/dL 28   50   42    ?Creatinine 0.61 - 1.24 mg/dL 12.06   12.58   14.20    ?Sodium 135 - 145 mmol/L 132    138   136    ?Potassium 3.5 - 5.1 mmol/L 4.8   5.4   5.2    ?Chloride 98 - 111 mmol/L 99   100   99    ?CO2 22 - 32 mmol/L 26   24     ?Calcium 8.9 - 10.3 mg/dL 8.4   9.2     ?Total Protein 6.5 - 8.1 g/dL 4.9   6.2

## 2021-08-14 NOTE — Progress Notes (Signed)
Hypoglycemic Event ? ?CBG: 68 ? ?Treatment: 4 oz juice/soda ? ?Symptoms: None ? ?Follow-up CBG: Time:2200 CBG Result:107 ? ?Possible Reasons for Event: Inadequate meal intake and Unknown ? ?Comments/MD notified: DR. Sheran Luz  ? ? ? ?Tad Moore ? ? ?

## 2021-08-14 NOTE — Progress Notes (Signed)
? ?  Palliative Medicine Inpatient Follow Up Note ? ? ?While rounding this morning, noted that Michael [name] had gone to HD this morning.  ? ?PMT will check in later in the day to see if her or is wife have any questions regarding the conversation held yesterday. ? ?No Charge ? ?______________________________________________________________________________________ ?Michael [name] ?[place] Team ?Team Cell Phone: [phone] ?Please utilize secure chat with additional questions, if there is no response within 30 minutes please call the above phone number ? ?Palliative Medicine Team providers are available by phone from 7am to 7pm daily and can be reached through the team cell phone.  ?Should this patient require assistance outside of these hours, please call the patient's attending physician. ? ? ? ? ?

## 2021-08-14 NOTE — Progress Notes (Signed)
Pt has left the floor to be transported to dialysis.  ?

## 2021-08-14 NOTE — Progress Notes (Addendum)
? ?  Palliative Medicine Inpatient Follow Up Note ? ? ?HPI: ?Michael Deleonis a 66 year old male with medical history of ESRD on HD, hypertension, chronic HFrEF, hyperlipidemia was admitted with altered mental status.  Patient has been weak and wobbly for past few days and had several falls.  Patient's weakness progressed over the past 3 days and he became more somnolent and confused. ?Patient missed his hemodialysis on Tuesday.  He was started on acyclovir for herpes keratitis by ophthalmology. ?  ?Palliative care was asked to get involved to further discuss goals of care in the setting of multiple chronic co-morbidities. ? ?Today's Discussion (08/15/2021): ? ?*Please note that this is a verbal dictation therefore any spelling or grammatical errors are due to the "[place]" system interpretation. ? ?Chart reviewed inclusive of vital signs, progress notes, laboratory results, and diagnostic images.  ? ?I met with [name] and his spouse, [name]. [name] shares that after dialysis yesterday, [name] was "back to himself". He is awake and alert. [name] is willing to start moving out of the bed today and realizes the importance of mobility. ? ?We reviewed the importance of completing advance directives.  ? ?Questions and concerns addressed  ? ?Palliative Support Provided ? ?Objective Assessment: ?Vital Signs ?Vitals:  ? 08/14/21 0200 08/14/21 0400  ?BP:  108/69  ?Pulse: 80 70  ?Resp: 20 14  ?Temp:    ?SpO2: 94% 97%  ? ? ?Intake/Output Summary (Last 24 hours) at 08/14/2021 0388 ?Last data filed at 08/14/2021 0400 ?Gross per 24 hour  ?Intake 1322.4 ml  ?Output --  ?Net 1322.4 ml  ? ?Last Weight  Most recent update: 08/12/2021  2:33 PM  ? ? Weight  ?85.3 kg (188 lb 0.8 oz)  ?      ? ?  ? ?Gen:  Older AA M in NAD ?HEENT: moist mucous membranes ?CV: Regular rate and rhythm ?PULM: On 2LPM [place], intermittent coughing ?ABD: soft/nontender ?EXT: No edema ?Neuro: Alert and oriented x3 ?  ?SUMMARY OF RECOMMENDATIONS   ?Full  Code/Full Scope of Care ?  ?Patients spouse hoping for patient to improve back to prior level of function ?  ?Advance Directives provided for review and completion ?  ?Ongoing incremental Palliative Support ?  ?MDM - Moderate ?______________________________________________________________________________________ ?[name] ?[place] Team ?Team Cell Phone: [phone] ?Please utilize secure chat with additional questions, if there is no response within 30 minutes please call the above phone number ? ?Palliative Medicine Team providers are available by phone from 7am to 7pm daily and can be reached through the team cell phone.  ?Should this patient require assistance outside of these hours, please call the patient's attending physician. ? ? ? ? ?

## 2021-08-14 NOTE — Evaluation (Signed)
Physical Therapy Evaluation ?Patient Details ?Name: Michael [name]. ?[name]: [id number] ?DOB: 1955-05-31 ?Today's Date: 08/14/2021 ? ?History of Present Illness ? 66 y/o male presented to ED on 08/11/21 for missed HD, frequent falls and AMS. CT head negative. PMH: ESRD, HTN, chronic HFrEF, prostate cancer, nonischemic cardiomyopathy  ?Clinical Impression ? Patient admitted with the above. Patient presents with impaired cognition, generalized weakness, decreased activity tolerance, and impaired balance. Patient required overall minA for all mobility. Patient sidestepping at [facility] with [place] and [place]. Patient declined further mobility and requesting bedpan despite encouragement to use BSC. Patient oriented to self and place and difficult to reorient to current situation. Patient will benefit from skilled PT services during acute stay to address listed deficits. Recommend HHPT if AMS clears and progresses well. If AMS persists, patient may need SNF at discharge depending on family availability to assist.    ?   ? ?Recommendations for follow up therapy are one component of a multi-disciplinary discharge planning process, led by the attending physician.  Recommendations may be updated based on patient status, additional functional criteria and insurance authorization. ? ?Follow Up Recommendations Home health PT (if AMS clears. If not, may need SNF) ? ?  ?Assistance Recommended at Discharge Frequent or constant Supervision/Assistance  ?Patient can return home with the following ? A little help with walking and/or transfers;A little help with bathing/dressing/bathroom;Assistance with cooking/housework;Direct supervision/assist for medications management;Direct supervision/assist for financial management;Help with stairs or ramp for entrance;Assist for transportation ? ?  ?Equipment Recommendations Other (comment) (TBD)  ?Recommendations for Other Services ?    ?  ?Functional Status Assessment Patient has had a recent decline in  their functional status and demonstrates the ability to make significant improvements in function in a reasonable and predictable amount of time.  ? ?  ?Precautions / Restrictions Precautions ?Precautions: Fall ?Restrictions ?Weight Bearing Restrictions: No  ? ?  ? ?Mobility ? Bed Mobility ?Overal bed mobility: Needs Assistance ?Bed Mobility: Supine to Sit, Sit to Supine ?  ?  ?Supine to sit: Min assist ?Sit to supine: Min assist ?  ?General bed mobility comments: minA for LE management and slight trunk elevation ?  ? ?Transfers ?Overall transfer level: Needs assistance ?Equipment used: 1 person hand held assist ?Transfers: Sit to/from Stand, Bed to chair/wheelchair/BSC ?Sit to Stand: Min assist ?  ?Step pivot transfers: Min assist ?  ?  ?  ?General transfer comment: minA to stand from low bed surface. Able to take side steps towards [facility] with minA. Patient requesting to use bathroom but declining use of BSC despite encouragement. Patient wanting bed pan ?  ? ?Ambulation/Gait ?  ?  ?  ?  ?  ?  ?  ?  ? ?Stairs ?  ?  ?  ?  ?  ? ?Wheelchair Mobility ?  ? ?Modified Rankin (Stroke Patients Only) ?  ? ?  ? ?Balance Overall balance assessment: Needs assistance ?Sitting-balance support: No upper extremity supported, Feet supported ?Sitting balance-Leahy Scale: Fair ?  ?  ?Standing balance support: Single extremity supported, During functional activity ?Standing balance-Leahy Scale: Poor ?Standing balance comment: minA for balance ?  ?  ?  ?  ?  ?  ?  ?  ?  ?  ?  ?   ? ? ? ?Pertinent Vitals/Pain Pain Assessment ?Pain Assessment: No/denies pain  ? ? ?Home Living Family/patient expects to be discharged to:: Private residence ?Living Arrangements: Spouse/significant other ?Available Help at Discharge: Family ?Type of Home: House ?  ?  ?  ?  ?  ?  ?  Additional Comments: unable to provide further information due to confusion  ?  ?Prior Function Prior Level of Function : Independent/Modified Independent ?  ?  ?  ?  ?  ?  ?  ?  ?   ? ? ?Hand Dominance  ?   ? ?  ?Extremity/Trunk Assessment  ? Upper Extremity Assessment ?Upper Extremity Assessment: Defer to OT evaluation ?  ? ?Lower Extremity Assessment ?Lower Extremity Assessment: Generalized weakness ?  ? ?Cervical / Trunk Assessment ?Cervical / Trunk Assessment: Normal  ?Communication  ? Communication: No difficulties  ?Cognition Arousal/Alertness: Lethargic ?Behavior During Therapy: Flat affect ?Overall Cognitive Status: No family/caregiver present to determine baseline cognitive functioning ?Area of Impairment: Orientation, Attention, Following commands, Safety/judgement, Awareness, Problem solving ?  ?  ?  ?  ?  ?  ?  ?  ?Orientation Level: Disoriented to, Time, Situation ?Current Attention Level: Sustained ?  ?Following Commands: Follows one step commands inconsistently ?Safety/Judgement: Decreased awareness of safety, Decreased awareness of deficits ?Awareness: Intellectual ?Problem Solving: Slow processing, Difficulty sequencing, Requires verbal cues ?  ?  ?  ? ?  ?General Comments General comments (skin integrity, edema, etc.): VSS on RA ? ?  ?Exercises    ? ?Assessment/Plan  ?  ?PT Assessment Patient needs continued PT services  ?PT Problem List Decreased strength;Decreased activity tolerance;Decreased balance;Decreased mobility;Decreased cognition;Decreased safety awareness;Decreased knowledge of precautions;Decreased knowledge of use of DME ? ?   ?  ?PT Treatment Interventions DME instruction;Gait training;Functional mobility training;Therapeutic activities;Therapeutic exercise;Stair training;Balance training;Patient/family education   ? ?PT Goals (Current goals can be found in the Care Plan section)  ?Acute Rehab PT Goals ?Patient Stated Goal: did not state ?PT Goal Formulation: Patient unable to participate in goal setting ?Time For Goal Achievement: 08/28/21 ?Potential to Achieve Goals: Fair ? ?  ?Frequency Min 3X/week ?  ? ? ?Co-evaluation   ?  ?  ?  ?  ? ? ?  ?AM-PAC PT "6  Clicks" Mobility  ?Outcome Measure Help needed turning from your back to your side while in a flat bed without using bedrails?: A Little ?Help needed moving from lying on your back to sitting on the side of a flat bed without using bedrails?: A Little ?Help needed moving to and from a bed to a chair (including a wheelchair)?: A Little ?Help needed standing up from a chair using your arms (e.g., wheelchair or bedside chair)?: A Little ?Help needed to walk in hospital room?: A Lot ?Help needed climbing 3-5 steps with a railing? : A Lot ?6 Click Score: 16 ? ?  ?End of Session Equipment Utilized During Treatment: Gait belt ?Activity Tolerance: Patient tolerated treatment well ?Patient left: in bed;with call bell/phone within reach;with bed alarm set ?Nurse Communication: Mobility status;Other (comment) (patient on bedpan) ?PT Visit Diagnosis: Unsteadiness on feet (R26.81);Muscle weakness (generalized) (M62.81);Difficulty in walking, not elsewhere classified (R26.2) ?  ? ?Time: 9480-1655 ?PT Time Calculation (min) (ACUTE ONLY): 14 min ? ? ?Charges:   PT Evaluation ?$PT Eval Moderate Complexity: 1 Mod ?  ?  ?   ? ? ?Corena Tilson A. Gilford Rile, PT, DPT ?Acute Rehabilitation Services ?Pager [phone] ?Office [phone] ? ? ?Carolee Channell A Kody Vigil ?08/14/2021, 4:57 PM ? ?

## 2021-08-14 NOTE — Progress Notes (Signed)
NEUROLOGY CONSULTATION PROGRESS NOTE  ? ?Date of service: August 14, 2021 ?Patient Name: Michael [name]. ?[name]:  [id number] ?DOB:  09/24/1955 ? ?  ?Interval Hx  ? ?Per wife, he is 80% back to his baseline. ? ?Vitals  ? ?Vitals:  ? 08/14/21 1240 08/14/21 1257 08/14/21 1630 08/14/21 1925  ?BP: (!) 94/59 1'03/68 99/69 95/68 '$  ?Pulse: 76 77 66 74  ?Resp: '19 20 17 18  '$ ?Temp:  98.5 ?F (36.9 ?C) 98.1 ?F (36.7 ?C) 98 ?F (36.7 ?C)  ?TempSrc:  Oral Oral Oral  ?SpO2: 97% 95% 98% 95%  ?Weight:      ?Height:      ?  ? ?Body mass index is 29.52 kg/m?. ? ?Physical Exam  ? ?General: Laying comfortably in bed; in no acute distress.  ?HENT: Normal oropharynx and mucosa. Normal external appearance of ears and nose.  ?Neck: Supple, no pain or tenderness  ?CV: No JVD. No peripheral edema.  ?Pulmonary: Symmetric Chest rise. Normal respiratory effort.  ?Abdomen: Soft to touch, non-tender.  ?Ext: No cyanosis, edema, or deformity  ?Skin: No rash. Normal palpation of skin.   ?Musculoskeletal: Normal digits and nails by inspection. No clubbing.  ? ?Neurologic Examination  ?Mental status/Cognition: Alert, oriented to self, place, month and year, good attention.  ?Speech/language: Fluent, comprehension intact, object naming intact, repetition intact.  ?Cranial nerves:  ? CN II Left pupil is large oblong and unreactive, R pupil is large, round and reactive to light.  ? CN III,IV,VI EOM intact, no gaze preference or deviation, no nystagmus   ? CN V normal sensation in V1, V2, and V3 segments bilaterally   ? CN VII no asymmetry, no nasolabial fold flattening   ? CN VIII normal hearing to speech   ? CN IX & X normal palatal elevation, no uvular deviation   ? CN XI 5/5 head turn and 5/5 shoulder shrug bilaterally   ? CN XII midline tongue protrusion   ? ?Motor:  ?Muscle bulk: normal, tone normal ?Mvmt Root Nerve  Muscle Right Left Comments  ?SA C5/6 Ax Deltoid     ?EF C5/6 Mc Biceps 5 5   ?EE C6/7/8 Rad Triceps 5 5   ?WF C6/7 Med FCR     ?WE C7/8 PIN  ECU     ?F Ab C8/T1 U ADM/FDI 5 5   ?HF L1/2/3 Fem Illopsoas 5 5   ?KE L2/3/4 Fem Quad     ?DF L4/5 D Peron Tib Ant     ?PF S1/2 Tibial Grc/Sol     ? ?Sensation: ? Light touch Intact throughout  ? Pin prick   ? Temperature   ? Vibration   ?Proprioception   ? ?Coordination/Complex Motor:  ?- Finger to Nose intact BL ? ?Labs  ? ?Basic Metabolic Panel:  ?Lab Results  ?Component Value Date  ? NA 132 (L) 08/14/2021  ? K 4.8 08/14/2021  ? CO2 26 08/14/2021  ? GLUCOSE 91 08/14/2021  ? BUN 28 (H) 08/14/2021  ? CREATININE 12.06 (H) 08/14/2021  ? CALCIUM 8.4 (L) 08/14/2021  ? GFRNONAA 4 (L) 08/14/2021  ? GFRAA 7 (L) 02/20/2020  ? ?HbA1c:  ?Lab Results  ?Component Value Date  ? HGBA1C 4.8 08/13/2021  ? ?LDL: No results found for: LDLCALC ?Urine Drug Screen:  ?   ?Component Value Date/Time  ? LABOPIA NTD 08/15/2008 0913  ? COCAINSCRNUR NTD 08/15/2008 0913  ? LABBENZ NTD 08/15/2008 0913  ? AMPHETMU NTD 08/15/2008 0913  ? THCU NTD 08/15/2008 0913  ?  LABBARB NTD 08/15/2008 0913  ?  ?Alcohol Level No results found for: ETH ?No results found for: PHENYTOIN, ZONISAMIDE, LAMOTRIGINE, LEVETIRACETA ?No results found for: PHENYTOIN, PHENOBARB, VALPROATE, CBMZ ? ?Imaging and Diagnostic studies  ? ?MRI brain without contrast: ?No acute intracranial abnormality. ? ? ?Routine EEG: Moderate diffuse encephalopathy.  No seizures or epileptiform discharges. ? ?Impression  ? ?Michael [name]. is a 66 year old male with encephalopathy of unclear etiology. Mentation has almost completely cleared up after dialysis and he is almost back to his baseline. Given significant improvement, I do not see a need for LP at this time.  My suspicion is very low for potential herpes encephalitis.  I suspect that his encephalopathy was likely due to acyclovir toxicity in the setting of skipped dialysis. ? ?Recommendations  ?No further inpatient neurological workup recommended at this time. ?- Agree with transitioning to eliquis from Heparin gtt. ?- we will  signoff. Please feel free to contact us with any questions or concerns. ?______________________________________________________________________ ? ? ?Thank you for the opportunity to take part in the care of this patient. If you have any further questions, please contact the neurology consultation attending. ? ?Signed, ? ?Donnetta Simpers ?[facility] ?Pager Number [phone] ? ?

## 2021-08-14 NOTE — Progress Notes (Signed)
FMLA paperwork placed in pt's chart that wife has requested for MD to fill out. MD made aware.  ?

## 2021-08-14 NOTE — Progress Notes (Signed)
Subjective: Seen on dialysis, complains of itching all over, no rash, given 6.25 IV Benadryl feels better, slightly confused thinks he is at kidney center however aware today is dialysis day Saturday  ? ?Objective ?Vital signs in last 24 hours: ?Vitals:  ? 08/14/21 0737 08/14/21 0747 08/14/21 0800 08/14/21 0830  ?BP: 97/73 1'03/63 92/68 90/61 '$  ?Pulse: 78 69 79 80  ?Resp: '18 15 20 '$ (!) 22  ?Temp:      ?TempSrc:      ?SpO2:      ?Weight:      ?Height:      ? ?Weight change:  ?Physical Exam: ?General: alert , pleasantly confused elderly male , NAD ?Heart: RRR no appreciated MRG ?Lungs: CTA anteriorly nonlabored breathing ?Abdomen: NABS, S, NTND ?Extremities: No pedal edema  ?dialysis Access: Right IJ PermCath patent on HD ? ?OP HD= [facility] ,TTS but missed last Thurs and this Tuesday  EDW 86 ( but seems like it needs to be lower). 4 hours and 15 minutes-  profile #2 ?HD Bath 2/2, Dialyzer 180, Heparin no. Access [facility]-  refusing other. Calcitriol 2 mcg tiw-  last hgb 12.6 ?  ?  ?Problem/Plan: ?Acute metabolic encephalopathy= multifactorial= possible acyclovir toxicity, hypoglycemia, uremia missed dialysis x2= neurology consulted and admit work-up, not somnolent today but pleasantly confused when rounding in dialysis ?ESRD -HD TTS on schedule/no sign of infection continue to use PermCath.(Refusing other accesses) 0.8 BUN 28, creatinine 12.06 stable for his ESRD.  Noted he had missed 2 outpatient treatments before admit because of AMS ?Anemia -Hgb 11.4 no ESA needs ?Secondary hyperparathyroidism -calcium stable, vitamin D on dialysis, Renvela binder, Sensipar continue follow-up ca phos labs ?HTN/volume -on losartan yesterday , will hold because of low BP, metoprolol 25 mg at bedtime (with history of A-fib ).  We will set parameters to hold for systolic under 976 /no volume excess on exam ? ? ? ?Ernest Haber, PA-C ?[facility] ?Beeper [phone] ?08/14/2021,9:12 AM ? LOS: 3 days  ? ?Labs: ?Basic Metabolic  Panel: ?Recent Labs  ?Lab 08/11/21 ?1300 08/11/21 ?1607 08/12/21 ?0430 08/14/21 ?0756  ?NA 137 136 138 132*  ?K 6.2* 5.2* 5.4* 4.8  ?CL 99 99 100 99  ?CO2 28  --  24 26  ?GLUCOSE 65* 56* 84 91  ?BUN 44* 42* 50* 28*  ?CREATININE 13.89* 14.20* 12.58* 12.06*  ?CALCIUM 9.2  --  9.2 8.4*  ? ?Liver Function Tests: ?Recent Labs  ?Lab 08/11/21 ?1300 08/12/21 ?0430 08/14/21 ?0756  ?AST 17 12* 13*  ?ALT '6 6 6  '$ ?ALKPHOS 26* 28* 24*  ?BILITOT 1.1 0.9 1.2  ?PROT 5.9* 6.2* 4.9*  ?ALBUMIN 3.0* 3.3* 2.5*  ? ?No results for input(s): LIPASE, AMYLASE in the last 168 hours. ?Recent Labs  ?Lab 08/11/21 ?1600 08/12/21 ?0836  ?AMMONIA 20 25  ? ?CBC: ?Recent Labs  ?Lab 08/11/21 ?1300 08/11/21 ?1607 08/12/21 ?0430 08/13/21 ?9024 08/14/21 ?0115  ?WBC 4.2  --  5.6 3.9* 3.7*  ?NEUTROABS 3.1  --   --   --   --   ?HGB 12.6*   < > 12.8* 11.6* 11.4*  ?HCT 41.5   < > 42.0 37.3* 36.6*  ?MCV 90.4  --  91.7 89.2 89.1  ?PLT 108*  --  144* 106* 120*  ? < > = values in this interval not displayed.  ? ?Cardiac Enzymes: ?No results for input(s): CKTOTAL, CKMB, CKMBINDEX, TROPONINI in the last 168 hours. ?CBG: ?Recent Labs  ?Lab 08/13/21 ?1300 08/13/21 ?1708 08/13/21 ?2032 08/13/21 ?  2200 08/14/21 ?0729  ?GLUCAP 101* 106* 68* 107* 84  ? ? ?Studies/Results: ?EEG adult ? ?Result Date: 08/13/2021 ?Lora Havens, MD     08/13/2021  8:47 AM Patient Name: Michael [name]. [name]: [id number] Epilepsy Attending: Lora Havens Referring Physician/Provider: Greta Doom, MD Date: 08/12/2021 Duration: 24.07 mins Patient history: 66 year old male with encephalopathy of unclear etiology.  EEG to evaluate for seizure. Level of alertness: Awake AEDs during EEG study: None Technical aspects: This EEG study was done with scalp electrodes positioned according to the 10-20 International system of electrode placement. Electrical activity was acquired at a sampling rate of '500Hz'$  and reviewed with a high frequency filter of '70Hz'$  and a low frequency filter of '1Hz'$ . EEG  data were recorded continuously and digitally stored. Description: No clear posterior dominant rhythm was seen. EEG showed continuous generalized predominantly 5 to 6 Hz theta slowing admixed with intermittent generalized 2 to 3 Hz delta slowing.  Hyperventilation and photic stimulation were not performed.   ABNORMALITY - Continuous slow, generalized IMPRESSION: This study is suggestive of moderate diffuse encephalopathy, nonspecific etiology. No seizures or epileptiform discharges were seen throughout the recording. Priyanka Barbra Sarks  ? ?ECHOCARDIOGRAM COMPLETE ? ?Result Date: 08/13/2021 ?   ECHOCARDIOGRAM REPORT   Patient Name:   Michael [name]. Date of Exam: 08/13/2021 Medical Rec #:  [id number]          Height:       67.0 in Accession #:    [id number]         Weight:       188.1 lb Date of Birth:  10-20-1955          BSA:          1.970 m? Patient Age:    43 years           BP:           109/71 mmHg Patient Gender: M                  HR:           87 bpm. Exam Location:  Inpatient Procedure: 2D Echo Indications:    Pericardial effusion  History:        Patient has prior history of Echocardiogram examinations, most                 recent 04/22/2021. Signs/Symptoms:Shortness of Breath; Risk                 Factors:Dyslipidemia and Hypertension.  Sonographer:    Arlyss Gandy Referring Phys: [place]  1. No significant changes from previous echo in Dec. 2022.  2. Left ventricular ejection fraction, by estimation, is <20%. The left ventricle has severely decreased function. The left ventricle demonstrates global hypokinesis. The left ventricular internal cavity size was severely dilated. Left ventricular diastolic parameters are consistent with Grade III diastolic dysfunction (restrictive).  3. Right ventricular systolic function is severely reduced. The right ventricular size is severely enlarged. There is moderately elevated pulmonary artery systolic pressure. The estimated right ventricular  systolic pressure is 29.5 mmHg.  4. Left atrial size was severely dilated.  5. Right atrial size was severely dilated.  6. The mitral valve is normal in structure. Trivial mitral valve regurgitation. No evidence of mitral stenosis.  7. Tricuspid valve regurgitation is mild to moderate.  8. The aortic valve is tricuspid. Aortic valve regurgitation is mild. No aortic stenosis is present. FINDINGS  Left  Ventricle: Left ventricular ejection fraction, by estimation, is <20%. The left ventricle has severely decreased function. The left ventricle demonstrates global hypokinesis. The left ventricular internal cavity size was severely dilated. There is no left ventricular hypertrophy. Left ventricular diastolic parameters are consistent with Grade III diastolic dysfunction (restrictive). Right Ventricle: The right ventricular size is severely enlarged. Right vetricular wall thickness was not well visualized. Right ventricular systolic function is severely reduced. There is moderately elevated pulmonary artery systolic pressure. The tricuspid regurgitant velocity is 3.16 m/s, and with an assumed right atrial pressure of 15 mmHg, the estimated right ventricular systolic pressure is 09.8 mmHg. Left Atrium: Left atrial size was severely dilated. Right Atrium: Right atrial size was severely dilated. Pericardium: There is no evidence of pericardial effusion. Mitral Valve: The mitral valve is normal in structure. Trivial mitral valve regurgitation. No evidence of mitral valve stenosis. Tricuspid Valve: The tricuspid valve is normal in structure. Tricuspid valve regurgitation is mild to moderate. Aortic Valve: The aortic valve is tricuspid. Aortic valve regurgitation is mild. Aortic regurgitation PHT measures 487 msec. No aortic stenosis is present. Aortic valve mean gradient measures 3.0 mmHg. Aortic valve peak gradient measures 5.1 mmHg. Aortic  valve area, by VTI measures 2.09 cm?. Pulmonic Valve: The pulmonic valve was normal in  structure. Pulmonic valve regurgitation is not visualized. Aorta: The aortic root and ascending aorta are structurally normal, with no evidence of dilitation. IAS/Shunts: The atrial septum is grossly normal.  LEFT

## 2021-08-14 NOTE — Progress Notes (Signed)
removed 1576ms net fluid unable to remove more due to hypotension.  pt kept climbing out of bed and standing up during dialysis tx, allowed pt sit on side of bed for second half of treatment had to be reminded frequently where he was and that he was on dialysis.  check BS was 89 gave some apple juice gave iv benadryl 6.25 for itching.  pre tx.  pre bp 97/63 post bp 93/79.  pt took off oxygen and refused to wear it then asked for it to be put back on last 45 minutes of treatment.  pre weight 87.4kg post weight 85.5kg.  catheter packed with heprain clamped and capped. ?

## 2021-08-14 NOTE — Progress Notes (Signed)
ANTICOAGULATION CONSULT NOTE ? ?Pharmacy Consult :  Transition from apixaban back to IV heparin ?Indication: atrial fibrillation ? ?Allergies  ?Allergen Reactions  ? Colchicine Other (See Comments)  ?  Other reaction(s): foot pain  ? Indapamide Other (See Comments)  ?  unknown  ? Lipitor [Atorvastatin] Itching  ?  Leg pain  ? Viagra [Sildenafil] Other (See Comments)  ?  Feels weird - Levitra. - headache  ? Zyloprim [Allopurinol] Other (See Comments)  ?  foot pain  ? ? ?Patient Measurements: ?Height: '5\' 7"'$  (170.2 cm) ?Weight: 85.5 kg (188 lb 7.9 oz) ?IBW/kg (Calculated) : 66.1 ?Heparin Dosing Weight: 85 kg ? ?Vital Signs: ?Temp: 98.5 ?F (36.9 ?C) (04/15 1257) ?Temp Source: Oral (04/15 1257) ?BP: 103/68 (04/15 1257) ?Pulse Rate: 77 (04/15 1257) ? ?Labs: ?Recent Labs  ?  08/11/21 ?1600 08/11/21 ?1607 08/11/21 ?1607 08/11/21 ?1840 08/12/21 ?0430 08/13/21 ?1607 08/13/21 ?1151 08/14/21 ?0115 08/14/21 ?0756  ?HGB  --  12.9*   < >  --  12.8* 11.6*  --  11.4*  --   ?HCT  --  38.0*   < >  --  42.0 37.3*  --  36.6*  --   ?PLT  --   --   --   --  144* 106*  --  120*  --   ?APTT  --   --   --   --   --  47* 82*  --   --   ?HEPARINUNFRC  --   --   --   --   --  0.21* 0.41  --   --   ?CREATININE  --  14.20*  --   --  12.58*  --   --   --  12.06*  ?TROPONINIHS 89*  --   --  75*  --   --   --   --   --   ? < > = values in this interval not displayed.  ? ? ? ?Estimated Creatinine Clearance: 6.5 mL/min (A) (by C-G formula based on SCr of 12.06 mg/dL (H)). ? ? ?Medical History: ?Past Medical History:  ?Diagnosis Date  ? Atrial arrhythmia   ? atrial tachycardia with variable AV conduction versus atypical aflutter 01/10/19, rate control (02/06/19)  ? Cataract   ? Dyspnea   ? on exertion  ? ESRD (end stage renal disease) ([place])   ? Hemo- MWF, Polycystic kidney disease  ? Fatigue   ? History of kidney stones   ? removal of stone- cysto  ? Hyperlipidemia   ? Hyperparathyroidism, secondary renal ([place])   ? Hypertension   ? Hypoxemia 12/12/2013  ?  Nonischemic cardiomyopathy ([place])   ? Er 25% 2015, 55 % 2013  ? OSA on CPAP   ? no longer using cpap  ? OSA on CPAP 03/24/2014  ? Pneumonia   ? 2015ish  ? Prostate cancer ([place])   ? Ventricular tachycardia//Freq PVCs   ? Wears glasses   ? ? ?Medications:  ?Medications Prior to Admission  ?Medication Sig Dispense Refill Last Dose  ? acyclovir (ZOVIRAX) 400 MG tablet Take 400 mg by mouth 2 (two) times daily.   Past Week  ? apixaban (ELIQUIS) 5 MG TABS tablet Take 1 tablet by mouth twice daily (Patient taking differently: Take 5 mg by mouth daily.) 180 tablet 1 Past Week at unk last dose  ? atropine 1 % ophthalmic solution Place 1 drop into the left eye in the morning and at bedtime.   Past Week  ?  brimonidine (ALPHAGAN) 0.2 % ophthalmic solution Place 1 drop into the left eye 3 (three) times daily. 10 mL 2 Past Week  ? cinacalcet (SENSIPAR) 30 MG tablet Take 30 mg by mouth daily.   Past Week  ? clotrimazole-betamethasone (LOTRISONE) cream Apply 1 application topically 2 (two) times daily. 45 g 2 Past Week  ? dorzolamide-timolol (COSOPT) 22.3-6.8 MG/ML ophthalmic solution Place 1 drop into the left eye 3 (three) times daily. 10 mL 2 Past Week  ? losartan (COZAAR) 25 MG tablet Take 0.5 tablets (12.5 mg total) by mouth daily. (Patient taking differently: Take 25 mg by mouth daily.) 45 tablet 3 Past Week  ? Loteprednol Etabonate (LOTEMAX SM) 0.38 % GEL Place 1 drop into the left eye every 2 (two) hours. 5 g 3 Past Week  ? metoprolol succinate (TOPROL XL) 25 MG 24 hr tablet Take 1 tablet (25 mg total) by mouth at bedtime. 90 tablet 3 Past Week at 1800  ? nitroGLYCERIN (NITROSTAT) 0.4 MG SL tablet Place 1 tablet (0.4 mg total) under the tongue every 5 (five) minutes x 3 doses as needed for chest pain. 30 tablet 12 unk  ? rosuvastatin (CRESTOR) 10 MG tablet Take 1 tablet (10 mg total) by mouth daily. 90 tablet 3 Past Week  ? sevelamer carbonate (RENVELA) 800 MG tablet Take 1,600 mg by mouth 3 (three) times daily with meals.    Past Week  ? sodium chloride (MURO 128) 2 % ophthalmic solution Place 1 drop into the left eye 4 (four) times daily. 15 mL 2 Past Week  ? spironolactone (ALDACTONE) 25 MG tablet Take 25 mg by mouth at bedtime.   Past Week  ? acetaZOLAMIDE (DIAMOX) 250 MG tablet Take 1 tablet (250 mg total) by mouth in the morning and at bedtime. (Patient not taking: Reported on 08/12/2021) 60 tablet 0 Not Taking  ? ?Scheduled:  ? [START ON 08/17/2021] apixaban  5 mg Oral BID  ? atropine  1 drop Left Eye TID  ? brimonidine  1 drop Left Eye TID  ? Chlorhexidine Gluconate Cloth  6 each Topical Q0600  ? cinacalcet  30 mg Oral Daily  ? dorzolamide-timolol  1 drop Left Eye TID  ? metoprolol succinate  25 mg Oral QHS  ? multivitamin  1 tablet Oral QHS  ? polyethylene glycol  17 g Oral Daily  ? rosuvastatin  10 mg Oral Daily  ? senna-docusate  1 tablet Oral BID  ? sevelamer carbonate  1,600 mg Oral TID WC  ? ? ?Assessment: ?62 yoM with PMH ESRD on HD TTS, HTN, LVEF < 20%, permanent Afib on Eliquis with last dose thought to be 4/9, admitted 4/12 with AMS, weakness, & falls.  Pharmacy has not be consulted to dose heparin while Eliquis on hold given possible need for lumbar puncture.  ? ?On 08/13/21: aPTT = 82 and was therapeutic on heparin drip 1450 units/hr.  ?Heparin drip was discontinued by MD, resumed Eliquis for h/o nonvalvular Afib, now transitioning back to IV heparin in anticipation of LP on Monday. LD of apixaban 4/15 1241. ?CBC stable with platelets on the lower side, Hgb 11.4.  ? ?Goal of Therapy: ?Heparin level 0.3-0.7 units/ml ?Monitor platelets by anticoagulation protocol: Yes ? ?Plan: ?Discontinue Eliquis 5 mg po BID ?Restart heparin IV 1450 units/hr at 4/16 0000, 12hr since last dose of eliquis ?Check aPTT in 8 hours and daily w/ HL and CBC ?Monitor for s/sx of bleeding.  ? ? ?Varney Daily, PharmD ?PGY1 Pharmacy Resident ? ?  Please check AMION for all [facility] pharmacy phone numbers ?After 10:00 PM call main pharmacy  [phone] ? ? ? ? ? ? ?

## 2021-08-15 DIAGNOSIS — I5022 Chronic systolic (congestive) heart failure: Secondary | ICD-10-CM | POA: Diagnosis not present

## 2021-08-15 DIAGNOSIS — D638 Anemia in other chronic diseases classified elsewhere: Secondary | ICD-10-CM

## 2021-08-15 DIAGNOSIS — G9341 Metabolic encephalopathy: Secondary | ICD-10-CM | POA: Diagnosis not present

## 2021-08-15 DIAGNOSIS — Z515 Encounter for palliative care: Secondary | ICD-10-CM | POA: Diagnosis not present

## 2021-08-15 DIAGNOSIS — N186 End stage renal disease: Secondary | ICD-10-CM | POA: Diagnosis not present

## 2021-08-15 LAB — BASIC METABOLIC PANEL
Anion gap: 9 (ref 5–15)
BUN: 17 mg/dL (ref 8–23)
CO2: 27 mmol/L (ref 22–32)
Calcium: 7.9 mg/dL — ABNORMAL LOW (ref 8.9–10.3)
Chloride: 95 mmol/L — ABNORMAL LOW (ref 98–111)
Creatinine, Ser: 8.9 mg/dL — ABNORMAL HIGH (ref 0.61–1.24)
GFR, Estimated: 6 mL/min — ABNORMAL LOW (ref >60)
Glucose, Bld: 87 mg/dL (ref 70–99)
Potassium: 4.2 mmol/L (ref 3.5–5.1)
Sodium: 131 mmol/L — ABNORMAL LOW (ref 135–145)

## 2021-08-15 LAB — CBC
HCT: 34.7 % — ABNORMAL LOW (ref 39.0–52.0)
Hemoglobin: 10.9 g/dL — ABNORMAL LOW (ref 13.0–17.0)
MCH: 27.9 pg (ref 26.0–34.0)
MCHC: 31.4 g/dL (ref 30.0–36.0)
MCV: 88.7 fL (ref 80.0–100.0)
Platelets: 106 10*3/uL — ABNORMAL LOW (ref 150–400)
RBC: 3.91 MIL/uL — ABNORMAL LOW (ref 4.22–5.81)
RDW: 14.3 % (ref 11.5–15.5)
WBC: 3.4 10*3/uL — ABNORMAL LOW (ref 4.0–10.5)
nRBC: 0 % (ref 0.0–0.2)

## 2021-08-15 LAB — GLUCOSE, CAPILLARY
Glucose-Capillary: 90 mg/dL (ref 70–99)
Glucose-Capillary: 120 mg/dL — ABNORMAL HIGH (ref 70–99)
Glucose-Capillary: 96 mg/dL (ref 70–99)
Glucose-Capillary: 98 mg/dL (ref 70–99)
Glucose-Capillary: 114 mg/dL — ABNORMAL HIGH (ref 70–99)
Glucose-Capillary: 81 mg/dL (ref 70–99)

## 2021-08-15 MED ORDER — HYDROXYZINE HCL 25 MG PO TABS
25.0000 mg | ORAL_TABLET | Freq: Three times a day (TID) | ORAL | Status: DC | PRN
  Administered 2021-08-15 – 2021-08-16 (×2): 25 mg via ORAL
  Filled 2021-08-15 (×2): qty 1

## 2021-08-15 MED ORDER — APIXABAN 5 MG PO TABS
5.0000 mg | ORAL_TABLET | Freq: Two times a day (BID) | ORAL | Status: DC
  Administered 2021-08-15 – 2021-08-16 (×3): 5 mg via ORAL
  Filled 2021-08-15 (×3): qty 1

## 2021-08-15 NOTE — Progress Notes (Signed)
ANTICOAGULATION CONSULT NOTE - Initial Consult ? ?Pharmacy Consult for apixaban ?Indication: atrial fibrillation ? ?Allergies  ?Allergen Reactions  ? Colchicine Other (See Comments)  ?  Other reaction(s): foot pain  ? Indapamide Other (See Comments)  ?  unknown  ? Lipitor [Atorvastatin] Itching  ?  Leg pain  ? Viagra [Sildenafil] Other (See Comments)  ?  Feels weird - Levitra. - headache  ? Zyloprim [Allopurinol] Other (See Comments)  ?  foot pain  ? ? ?Patient Measurements: ?Height: '5\' 7"'$  (170.2 cm) ?Weight: 85.5 kg (188 lb 7.9 oz) ?IBW/kg (Calculated) : 66.1 ? ?Vital Signs: ?Temp: 98 ?F (36.7 ?C) (04/16 4098) ?Temp Source: Oral (04/16 1191) ?BP: 82/52 (04/16 0313) ?Pulse Rate: 60 (04/16 0313) ? ?Labs: ?Recent Labs  ?  08/13/21 ?0212 08/13/21 ?1151 08/14/21 ?0115 08/14/21 ?0756 08/15/21 ?0209  ?HGB 11.6*  --  11.4*  --  10.9*  ?HCT 37.3*  --  36.6*  --  34.7*  ?PLT 106*  --  120*  --  106*  ?APTT 47* 82*  --   --   --   ?HEPARINUNFRC 0.21* 0.41  --   --   --   ?CREATININE  --   --   --  12.06*  --   ? ? ?Estimated Creatinine Clearance: 6.5 mL/min (A) (by C-G formula based on SCr of 12.06 mg/dL (H)). ? ? ?Medical History: ?Past Medical History:  ?Diagnosis Date  ? Atrial arrhythmia   ? atrial tachycardia with variable AV conduction versus atypical aflutter 01/10/19, rate control (02/06/19)  ? Cataract   ? Dyspnea   ? on exertion  ? ESRD (end stage renal disease) ([place])   ? Hemo- MWF, Polycystic kidney disease  ? Fatigue   ? History of kidney stones   ? removal of stone- cysto  ? Hyperlipidemia   ? Hyperparathyroidism, secondary renal ([place])   ? Hypertension   ? Hypoxemia 12/12/2013  ? Nonischemic cardiomyopathy ([place])   ? Er 25% 2015, 55 % 2013  ? OSA on CPAP   ? no longer using cpap  ? OSA on CPAP 03/24/2014  ? Pneumonia   ? 2015ish  ? Prostate cancer ([place])   ? Ventricular tachycardia//Freq PVCs   ? Wears glasses   ? ? ?Medications:  ?Medications Prior to Admission  ?Medication Sig Dispense Refill Last Dose  ? acyclovir  (ZOVIRAX) 400 MG tablet Take 400 mg by mouth 2 (two) times daily.   Past Week  ? apixaban (ELIQUIS) 5 MG TABS tablet Take 1 tablet by mouth twice daily (Patient taking differently: Take 5 mg by mouth daily.) 180 tablet 1 Past Week at unk last dose  ? atropine 1 % ophthalmic solution Place 1 drop into the left eye in the morning and at bedtime.   Past Week  ? brimonidine (ALPHAGAN) 0.2 % ophthalmic solution Place 1 drop into the left eye 3 (three) times daily. 10 mL 2 Past Week  ? cinacalcet (SENSIPAR) 30 MG tablet Take 30 mg by mouth daily.   Past Week  ? clotrimazole-betamethasone (LOTRISONE) cream Apply 1 application topically 2 (two) times daily. 45 g 2 Past Week  ? dorzolamide-timolol (COSOPT) 22.3-6.8 MG/ML ophthalmic solution Place 1 drop into the left eye 3 (three) times daily. 10 mL 2 Past Week  ? losartan (COZAAR) 25 MG tablet Take 0.5 tablets (12.5 mg total) by mouth daily. (Patient taking differently: Take 25 mg by mouth daily.) 45 tablet 3 Past Week  ? Loteprednol Etabonate (LOTEMAX SM) 0.38 %  GEL Place 1 drop into the left eye every 2 (two) hours. 5 g 3 Past Week  ? metoprolol succinate (TOPROL XL) 25 MG 24 hr tablet Take 1 tablet (25 mg total) by mouth at bedtime. 90 tablet 3 Past Week at 1800  ? nitroGLYCERIN (NITROSTAT) 0.4 MG SL tablet Place 1 tablet (0.4 mg total) under the tongue every 5 (five) minutes x 3 doses as needed for chest pain. 30 tablet 12 unk  ? rosuvastatin (CRESTOR) 10 MG tablet Take 1 tablet (10 mg total) by mouth daily. 90 tablet 3 Past Week  ? sevelamer carbonate (RENVELA) 800 MG tablet Take 1,600 mg by mouth 3 (three) times daily with meals.   Past Week  ? sodium chloride (MURO 128) 2 % ophthalmic solution Place 1 drop into the left eye 4 (four) times daily. 15 mL 2 Past Week  ? spironolactone (ALDACTONE) 25 MG tablet Take 25 mg by mouth at bedtime.   Past Week  ? acetaZOLAMIDE (DIAMOX) 250 MG tablet Take 1 tablet (250 mg total) by mouth in the morning and at bedtime. (Patient  not taking: Reported on 08/12/2021) 60 tablet 0 Not Taking  ? ?Scheduled:  ? atropine  1 drop Left Eye TID  ? brimonidine  1 drop Left Eye TID  ? Chlorhexidine Gluconate Cloth  6 each Topical Q0600  ? cinacalcet  30 mg Oral Daily  ? dorzolamide-timolol  1 drop Left Eye TID  ? metoprolol succinate  25 mg Oral QHS  ? multivitamin  1 tablet Oral QHS  ? polyethylene glycol  17 g Oral Daily  ? rosuvastatin  10 mg Oral Daily  ? senna-docusate  1 tablet Oral BID  ? sevelamer carbonate  1,600 mg Oral TID WC  ? ?Infusions:  ? dextrose 30 mL/hr at 08/14/21 1500  ? ? ?Assessment: ?66yo male transitioned from Eliquis to UFH while awaiting procedures, now cleared to transition back to Eliquis. ? ?Plan:  ?Eliquis '5mg'$  PO BID. ? ?Wynona Neat, PharmD, BCPS  ?08/15/2021,5:31 AM ? ? ?

## 2021-08-15 NOTE — Progress Notes (Addendum)
?      ?                 PROGRESS NOTE ? ?      ?PATIENT DETAILS ?Name: Michael [name]. ?Age: 66 y.o. ?Sex: male ?Date of Birth: 1955/09/22 ?Admit Date: 08/11/2021 ?Admitting Physician Jonnie Finner, DO ?YYT:KPTWS, Herbie [place], MD ? ?Brief Summary: ?Patient is a 66 y.o.  male with history of HFrEF, persistent atrial fibrillation, ESRD on HD, HTN, HLD, dendritic herpes keratitis, central retinal vein occlusion-who presented to the hospital with altered mental status.He was thought to have acute toxic/metabolic encephalopathy from missed HD/and accumulation of acyclovir.  He was subsequently admitted to the hospitalist service. ? ?Significant events: ?4/12>> admit to [facility] for confusion. ? ?Significant studies: ?4/12>> CT head: No acute intracranial abnormality ?4/12>> MRI brain: No acute intracranial abnormality ?4/14>> Echo: FK<81%, grade 3 diastolic dysfunction, RV systolic function severely reduced.  RVSP 54.9. ? ?Significant microbiology data: ?4/12>> COVID/influenza PCR: Negative ?4/15>> blood culture: No growth ? ?Procedures: ?None ? ?Consults: ?Nephrology, neurology ? ? ?Subjective: ?Lying comfortably in bed-denies any chest pain or shortness of breath.  Seems to be awake and alert and answering questions appropriately.  Spouse at bedside. ? ?Objective: ?Vitals: ?Blood pressure (!) 82/52, pulse 60, temperature 98 ?F (36.7 ?C), temperature source Oral, resp. rate 19, height '5\' 7"'$  (1.702 m), weight 85.5 kg, SpO2 97 %.  ? ?Exam: ?Gen Exam:Alert awake-not in any distress ?HEENT:atraumatic, normocephalic ?Chest: B/L clear to auscultation anteriorly ?CVS:S1S2 regular ?Abdomen:soft non tender, non distended ?Extremities:no edema ?Neurology: Non focal ?Skin: no rash ? ?Pertinent Labs/Radiology: ? ?  Latest Ref Rng & Units 08/15/2021  ?  2:09 AM 08/14/2021  ?  1:15 AM 08/13/2021  ?  2:12 AM  ?CBC  ?WBC 4.0 - 10.5 K/uL 3.4   3.7   3.9    ?Hemoglobin 13.0 - 17.0 g/dL 10.9   11.4   11.6    ?Hematocrit 39.0 - 52.0 % 34.7   36.6    37.3    ?Platelets 150 - 400 K/uL 106   120   106    ?  ?Lab Results  ?Component Value Date  ? NA 131 (L) 08/15/2021  ? K 4.2 08/15/2021  ? CL 95 (L) 08/15/2021  ? CO2 27 08/15/2021  ?  ? ? ?Assessment/Plan: ?Acute toxic metabolic encephalopathy: Due to a combination of uremia from missed HD and acyclovir toxicity.  Mental status is improved-and close to baseline after resumption of HD and holding acyclovir.  Neurology has signed off-not felt to require LP at this point. ? ?ESRD on HD TTS: Nephrology following and directing HD care. ? ?Normocytic anemia: Due to ESRD-defer Aranesp/iron to nephrology service. ? ?Thrombocytopenia: Chronic issue-mild-stable for further follow-up in the outpatient setting. ? ?Chronic HFrEF/pulmonary hypertension: Relatively compensated-volume removal with HD.  Reviewed last cardiology note on 12/1-not a candidate for ICD due to infection risk.  Continue beta-blocker-BP too soft to tolerate other therapies at this point. ? ?Persistent atrial fibrillation: Continue telemetry monitoring-on Eliquis. ? ?History of herpes keratitis: On maintenance acyclovir-followed by ophthalmology-Acyclovir currently on hold.  Patient/spouse aware that they will need quick outpatient follow-up with ophthalmology. ? ?Debility/deconditioning: Due to acute illness-has not ambulated yet since hospitalization-mental status now much improved-await PT/OT eval. ? ?BMI: ?Estimated body mass index is 29.52 kg/m? as calculated from the following: ?  Height as of this encounter: '5\' 7"'$  (1.702 m). ?  Weight as of this encounter: 85.5 kg.  ? ?Code status: ?  Code Status: Full Code  ? ?DVT Prophylaxis: ?Place and maintain sequential compression device Start: 08/13/21 1232 ?apixaban (ELIQUIS) tablet 5 mg   ? ?Family Communication: Spouse at bedside. ? ? ?Disposition Plan: ?Status is: Inpatient ?Remains inpatient appropriate because: Improving encephalopathy-severe debility-need to mobilize before consideration of  discharge. ?  ?Planned Discharge Destination:Home health ? ? ?Diet: ?Diet Order   ? ?       ?  Diet renal with fluid restriction Fluid restriction: 1200 mL Fluid; Room service appropriate? Yes; Fluid consistency: Thin  Diet effective now       ?  ? ?  ?  ? ?  ?  ? ? ?Antimicrobial agents: ?Anti-infectives (From admission, onward)  ? ? None  ? ?  ? ? ? ?MEDICATIONS: ?Scheduled Meds: ? apixaban  5 mg Oral BID  ? atropine  1 drop Left Eye TID  ? brimonidine  1 drop Left Eye TID  ? Chlorhexidine Gluconate Cloth  6 each Topical Q0600  ? cinacalcet  30 mg Oral Daily  ? dorzolamide-timolol  1 drop Left Eye TID  ? metoprolol succinate  25 mg Oral QHS  ? multivitamin  1 tablet Oral QHS  ? polyethylene glycol  17 g Oral Daily  ? rosuvastatin  10 mg Oral Daily  ? senna-docusate  1 tablet Oral BID  ? sevelamer carbonate  1,600 mg Oral TID WC  ? ?Continuous Infusions: ? dextrose 30 mL/hr at 08/14/21 1500  ? ?PRN Meds:.metoprolol tartrate, nitroGLYCERIN ? ? ?I have personally reviewed following labs and imaging studies ? ?LABORATORY DATA: ?CBC: ?Recent Labs  ?Lab 08/11/21 ?1300 08/11/21 ?1607 08/12/21 ?0430 08/13/21 ?7893 08/14/21 ?0115 08/15/21 ?0209  ?WBC 4.2  --  5.6 3.9* 3.7* 3.4*  ?NEUTROABS 3.1  --   --   --   --   --   ?HGB 12.6* 12.9* 12.8* 11.6* 11.4* 10.9*  ?HCT 41.5 38.0* 42.0 37.3* 36.6* 34.7*  ?MCV 90.4  --  91.7 89.2 89.1 88.7  ?PLT 108*  --  144* 106* 120* 106*  ? ? ?Basic Metabolic Panel: ?Recent Labs  ?Lab 08/11/21 ?1300 08/11/21 ?1607 08/12/21 ?0430 08/14/21 ?8101 08/15/21 ?7510  ?NA 137 136 138 132* 131*  ?K 6.2* 5.2* 5.4* 4.8 4.2  ?CL 99 99 100 99 95*  ?CO2 28  --  '24 26 27  '$ ?GLUCOSE 65* 56* 84 91 87  ?BUN 44* 42* 50* 28* 17  ?CREATININE 13.89* 14.20* 12.58* 12.06* 8.90*  ?CALCIUM 9.2  --  9.2 8.4* 7.9*  ? ? ?GFR: ?Estimated Creatinine Clearance: 8.8 mL/min (A) (by C-G formula based on SCr of 8.9 mg/dL (H)). ? ?Liver Function Tests: ?Recent Labs  ?Lab 08/11/21 ?1300 08/12/21 ?0430 08/14/21 ?0756  ?AST 17 12*  13*  ?ALT '6 6 6  '$ ?ALKPHOS 26* 28* 24*  ?BILITOT 1.1 0.9 1.2  ?PROT 5.9* 6.2* 4.9*  ?ALBUMIN 3.0* 3.3* 2.5*  ? ?No results for input(s): LIPASE, AMYLASE in the last 168 hours. ?Recent Labs  ?Lab 08/11/21 ?1600 08/12/21 ?0836  ?AMMONIA 20 25  ? ? ?Coagulation Profile: ?No results for input(s): INR, PROTIME in the last 168 hours. ? ?Cardiac Enzymes: ?No results for input(s): CKTOTAL, CKMB, CKMBINDEX, TROPONINI in the last 168 hours. ? ?BNP (last 3 results) ?No results for input(s): PROBNP in the last 8760 hours. ? ?Lipid Profile: ?No results for input(s): CHOL, HDL, LDLCALC, TRIG, CHOLHDL, LDLDIRECT in the last 72 hours. ? ?Thyroid Function Tests: ?No results for input(s): TSH, T4TOTAL, FREET4, T3FREE, THYROIDAB in  the last 72 hours. ? ?Anemia Panel: ?No results for input(s): VITAMINB12, FOLATE, FERRITIN, TIBC, IRON, RETICCTPCT in the last 72 hours. ? ?Urine analysis: ?   ?Component Value Date/Time  ? COLORURINE YELLOW 04/01/2012 1311  ? APPEARANCEUR CLOUDY (A) 04/01/2012 1311  ? LABSPEC 1.016 04/01/2012 1311  ? PHURINE 5.0 04/01/2012 1311  ? GLUCOSEU NEGATIVE 04/01/2012 1311  ? HGBUR NEGATIVE 04/01/2012 1311  ? BILIRUBINUR SMALL (A) 04/01/2012 1311  ? KETONESUR 15 (A) 04/01/2012 1311  ? PROTEINUR 100 (A) 04/01/2012 1311  ? UROBILINOGEN 0.2 04/01/2012 1311  ? NITRITE NEGATIVE 04/01/2012 1311  ? LEUKOCYTESUR TRACE (A) 04/01/2012 1311  ? ? ?Sepsis Labs: ?Lactic Acid, Venous ?   ?Component Value Date/Time  ? LATICACIDVEN 1.9 05/29/2019 2002  ? ? ?MICROBIOLOGY: ?Recent Results (from the past 240 hour(s))  ?Resp Panel by RT-PCR (Flu A&B, Covid) Nasopharyngeal Swab     Status: None  ? Collection Time: 08/11/21  3:45 PM  ? Specimen: Nasopharyngeal Swab; Nasopharyngeal(NP) swabs in vial transport medium  ?Result Value Ref Range Status  ? SARS Coronavirus 2 by RT PCR NEGATIVE NEGATIVE Final  ?  Comment: (NOTE) ?SARS-CoV-2 target nucleic acids are NOT DETECTED. ? ?The SARS-CoV-2 RNA is generally detectable in upper  respiratory ?specimens during the acute phase of infection. The lowest ?concentration of SARS-CoV-2 viral copies this assay can detect is ?138 copies/mL. A negative result does not preclude SARS-Cov-2 ?infection and should no

## 2021-08-15 NOTE — Progress Notes (Signed)
Subjective: In room with wife present, confusion/altered mental status now resolved.  HD yesterday on schedule I.5 L UF no current complaints ,SBP in 90s asymptomatic ? ?Objective ?Vital signs in last 24 hours: ?Vitals:  ? 08/14/21 1630 08/14/21 1925 08/14/21 2310 08/15/21 0313  ?BP: '99/69 95/68 96/70 '$ (!) 82/52  ?Pulse: 66 74 69 60  ?Resp: '17 18 18 19  '$ ?Temp: 98.1 ?F (36.7 ?C) 98 ?F (36.7 ?C) 98.3 ?F (36.8 ?C) 98 ?F (36.7 ?C)  ?TempSrc: Oral Oral Oral Oral  ?SpO2: 98% 95% 98% 97%  ?Weight:      ?Height:      ? ?Weight change:  ? ?Physical Exam: ?General: alert , appropriate, pleasant male NAD, O x3 ?Heart: RRR no appreciated MRG ?Lungs: CTA anteriorly nonlabored breathing ?Abdomen: NABS, S, NTND ?Extremities: No pedal edema  ?dialysis Access: Right IJ PermCath nontender dressing dry clear ?  ?OP HD= [facility] ,TTS but missed last Thurs and this Tuesday  EDW 86 ( but seems like it needs to be lower). 4 hours and 15 minutes-  profile #2 ?HD Bath 2/2, Dialyzer 180, Heparin no. Access [facility]-  refusing other. Calcitriol 2 mcg tiw-  last hgb 12.6 ?  ?  ?Problem/Plan: ?Acute metabolic encephalopathy= multifactorial= probable acyclovir toxicity, in the setting of mild uremia missed dialysis x2= now resolved.  Neurology consulted, no further work-up  ?ESRD -HD TTS on schedule/no PermCath.(Refusing other accesses)  Noted he had missed 2 outpatient treatments before admit because of AMS, next dialysis Tuesday 4/18 on schedule ?Anemia -Hgb 10.9 <11.4 no ESA needs ?Secondary hyperparathyroidism -calcium stable, vitamin D on dialysis, Renvela binder, Sensipar continue follow-up ca phos labs ?HTN/volume -now only on metoprolol 25 mg XL HS , off losartan with lowish blood pressures.  No excess volume, yesterday UF 1.5 L. ?Chronic HF R EF/pulmonary hypertension(now is relatively compensated) reportedly cardiology last note 12/01 not a candidate for ICD due to infection risk continue low-dose beta-blocker. noted EF now less than 20% 2D echo  4/14 ? ? ?Ernest Haber, PA-C ?[facility] ?Beeper [phone] ?08/15/2021,12:11 PM ? LOS: 4 days  ? ?Labs: ?Basic Metabolic Panel: ?Recent Labs  ?Lab 08/12/21 ?0430 08/14/21 ?6269 08/15/21 ?4854  ?NA 138 132* 131*  ?K 5.4* 4.8 4.2  ?CL 100 99 95*  ?CO2 '24 26 27  '$ ?GLUCOSE 84 91 87  ?BUN 50* 28* 17  ?CREATININE 12.58* 12.06* 8.90*  ?CALCIUM 9.2 8.4* 7.9*  ? ?Liver Function Tests: ?Recent Labs  ?Lab 08/11/21 ?1300 08/12/21 ?0430 08/14/21 ?0756  ?AST 17 12* 13*  ?ALT '6 6 6  '$ ?ALKPHOS 26* 28* 24*  ?BILITOT 1.1 0.9 1.2  ?PROT 5.9* 6.2* 4.9*  ?ALBUMIN 3.0* 3.3* 2.5*  ? ?No results for input(s): LIPASE, AMYLASE in the last 168 hours. ?Recent Labs  ?Lab 08/11/21 ?1600 08/12/21 ?0836  ?AMMONIA 20 25  ? ?CBC: ?Recent Labs  ?Lab 08/11/21 ?1300 08/11/21 ?1607 08/12/21 ?0430 08/13/21 ?6270 08/14/21 ?0115 08/15/21 ?0209  ?WBC 4.2  --  5.6 3.9* 3.7* 3.4*  ?NEUTROABS 3.1  --   --   --   --   --   ?HGB 12.6*   < > 12.8* 11.6* 11.4* 10.9*  ?HCT 41.5   < > 42.0 37.3* 36.6* 34.7*  ?MCV 90.4  --  91.7 89.2 89.1 88.7  ?PLT 108*  --  144* 106* 120* 106*  ? < > = values in this interval not displayed.  ? ?Cardiac Enzymes: ?No results for input(s): CKTOTAL, CKMB, CKMBINDEX, TROPONINI in the last  168 hours. ?CBG: ?Recent Labs  ?Lab 08/14/21 ?1805 08/14/21 ?1928 08/14/21 ?2309 08/15/21 ?3474 08/15/21 ?2595  ?GLUCAP 105* 100* 118* 90 120*  ? ? ?Studies/Results: ?No results found. ?Medications: ? dextrose 30 mL/hr at 08/14/21 1500  ? ? apixaban  5 mg Oral BID  ? atropine  1 drop Left Eye TID  ? brimonidine  1 drop Left Eye TID  ? Chlorhexidine Gluconate Cloth  6 each Topical Q0600  ? cinacalcet  30 mg Oral Daily  ? dorzolamide-timolol  1 drop Left Eye TID  ? metoprolol succinate  25 mg Oral QHS  ? multivitamin  1 tablet Oral QHS  ? polyethylene glycol  17 g Oral Daily  ? rosuvastatin  10 mg Oral Daily  ? senna-docusate  1 tablet Oral BID  ? sevelamer carbonate  1,600 mg Oral TID WC  ? ? ? ? ?

## 2021-08-15 NOTE — TOC Initial Note (Signed)
Transition of Care (TOC) - Initial/Assessment Note  ? ? ?Patient Details  ?Name: Michael [name]. ?[name]: [id number] ?Date of Birth: 1955-11-18 ? ?Transition of Care (TOC) CM/SW Contact:    ?Carles Collet, RN ?Phone Number: ?08/15/2021, 4:28 PM ? ?Clinical Narrative:              Spoke w patient's wife and she declines [facility] at this time, wanting to wait and see how the patient does over the next day or so expecting that as he clears mentally that he will continue to improve physically and not need any HH services. Patient has a cane at home and they do not expect to need any DME.    ? ? ?Expected Discharge Plan: Home/Self Care ?Barriers to Discharge: Continued Medical Work up ? ? ?Patient Goals and CMS Choice ?  ?  ?  ? ?Expected Discharge Plan and Services ?Expected Discharge Plan: Home/Self Care ?  ?Discharge Planning Services: CM Consult ?  ?Living arrangements for the past 2 months: [place] ?                ?  ?  ?  ?  ?  ?  ?  ?  ?  ?  ? ?Prior Living Arrangements/Services ?Living arrangements for the past 2 months: [place] ?Lives with:: Spouse ?  ?Do you feel safe going back to the place where you live?: Yes      ?  ?  ?  ?  ? ?Activities of Daily Living ?Home Assistive Devices/Equipment: Eyeglasses ?ADL Screening (condition at time of admission) ?Patient's cognitive ability adequate to safely complete daily activities?: No ?Is the patient deaf or have difficulty hearing?: No ?Does the patient have difficulty seeing, even when wearing glasses/contacts?: Yes (trouble seeing out of left eye - new problem) ?Does the patient have difficulty concentrating, remembering, or making decisions?: Yes ?Patient able to express need for assistance with ADLs?: No ?Does the patient have difficulty dressing or bathing?: Yes ?Independently performs ADLs?: No ?Communication: Independent ?Dressing (OT): Needs assistance ?Is this a change from baseline?: Change from baseline, expected to last >3 days ?Grooming:  Needs assistance ?Is this a change from baseline?: Change from baseline, expected to last >3 days ?Feeding: Needs assistance ?Is this a change from baseline?: Change from baseline, expected to last >3 days ?Bathing: Needs assistance ?Is this a change from baseline?: Change from baseline, expected to last >3 days ?Toileting: Needs assistance ?Is this a change from baseline?: Change from baseline, expected to last >3days ?In/Out Bed: Needs assistance ?Is this a change from baseline?: Change from baseline, expected to last >3 days ?Walks in Home: Needs assistance ?Is this a change from baseline?: Change from baseline, expected to last >3 days ?Does the patient have difficulty walking or climbing stairs?: Yes ?Weakness of Legs: Both ?Weakness of Arms/Hands: Both ? ?Permission Sought/Granted ?  ?  ?   ?   ?   ?   ? ?Emotional Assessment ?  ?  ?  ?  ?  ?  ? ?Admission diagnosis:  ESRD (end stage renal disease) ([place]) [N18.6] ?Altered mental status, unspecified altered mental status type [R41.82] ?Acute metabolic encephalopathy [W09.81] ?Patient Active Problem List  ? Diagnosis Date Noted  ? Acute metabolic encephalopathy 19/14/7829  ? Herpes keratitis 08/11/2021  ? Hypoglycemia 08/11/2021  ? Hyperkalemia 08/11/2021  ? Chronic systolic heart failure ([place]) 04/22/2021  ? Hyperlipidemia 04/22/2021  ? TIA (transient ischemic attack) 08/19/2020  ? Malignant neoplasm of  prostate ([place]) 03/25/2020  ? Leukopenia   ? Thrombocytopenia ([place])   ? Anemia of chronic disease   ? History of ventricular tachycardia   ? Anoxic brain injury ([place]) 06/04/2019  ? Status post creation of arteriovenous fistula 05/29/2019  ? Cardiac arrest ([place]) 05/29/2019  ? Unspecified atrial flutter ([place]) 05/21/2019  ? Atrial tachycardia ([place]) 02/06/2019  ? Pseudoaneurysm of AV hemodialysis fistula, initial encounter ([place]) 04/14/2017  ? Status post right inguinal hernia repair-July 2017 11/06/2015  ? Hypersomnia with sleep apnea 09/15/2015  ? Bradycardia, drug  induced 09/15/2015  ? OSA on CPAP 03/24/2014  ? Hypoxemia 12/12/2013  ? Hypersomnia with sleep apnea, unspecified 12/12/2013  ? Nonischemic cardiomyopathy ([place])   ? VT (ventricular tachycardia) ([place]) 05/13/2013  ? PVC's (premature ventricular contractions) 05/24/2012  ? Autosomal dominant polycystic kidney disease 02/27/2012  ? Pre-transplant evaluation for chronic kidney disease 02/27/2012  ? S/P appendectomy 02/27/2012  ? End stage renal disease ([place]) 02/27/2012  ? Essential hypertension 04/06/2011  ? ?PCP:  [name], MD ?Pharmacy:   ?[facility] - [place], [address] ?[place] [address] ?[place] [address] ?Phone: [phone] Fax: [phone] ? ?[place] [address] ?Phone: [phone] Fax: [phone] ? ?[facility] - [place], [place] - [address] ?[address] ?[address], [address] ?[place] [address] ?Phone: [phone] Fax: [phone] ? ? ? ? ?Social Determinants of Health (SDOH) Interventions ?  ? ?Readmission Risk Interventions ?   ? View : No data to display.  ?  ?  ?  ? ? ? ?

## 2021-08-15 NOTE — Evaluation (Signed)
Occupational Therapy Evaluation ?Patient Details ?Name: Michael [name]. ?[name]: [id number] ?DOB: 10-10-1955 ?Today's Date: 08/15/2021 ? ? ?History of Present Illness 66 y/o male presented to ED on 08/11/21 for missed HD, frequent falls and AMS. CT head negative. PMH: ESRD, HTN, chronic HFrEF, prostate cancer, nonischemic cardiomyopathy  ? ?Clinical Impression ?  ?Pt admitted for concerns listed above. PTA pt reported that he was independent with all ADL's and IADL's, usingno DME. At this time, pt is improving cognitively and physically. He is alert and oriented x4 and follows all simple commands. He is requiring min A for all ADL's  and functional mobility, using  no DME. Pt's balance overall is fair and strength WFL. Recommending HHOT at this time, pt may progress to not needing any follow up. OT will continue to follow.  ?   ? ?Recommendations for follow up therapy are one component of a multi-disciplinary discharge planning process, led by the attending physician.  Recommendations may be updated based on patient status, additional functional criteria and insurance authorization.  ? ?Follow Up Recommendations ? Home health OT  ?  ?Assistance Recommended at Discharge Intermittent Supervision/Assistance  ?Patient can return home with the following A little help with walking and/or transfers;A little help with bathing/dressing/bathroom;Assistance with cooking/housework;Direct supervision/assist for medications management;Direct supervision/assist for financial management ? ?  ?Functional Status Assessment ? Patient has had a recent decline in their functional status and demonstrates the ability to make significant improvements in function in a reasonable and predictable amount of time.  ?Equipment Recommendations ? Tub/shower seat  ?  ?Recommendations for Other Services   ? ? ?  ?Precautions / Restrictions Precautions ?Precautions: Fall ?Restrictions ?Weight Bearing Restrictions: No  ? ?  ? ?Mobility Bed  Mobility ?Overal bed mobility: Modified Independent ?  ?  ?  ?  ?  ?  ?General bed mobility comments: no difficulties ?  ? ?Transfers ?Overall transfer level: Needs assistance ?Equipment used: 1 person hand held assist ?Transfers: Sit to/from Stand ?Sit to Stand: Min assist ?  ?  ?  ?  ?  ?General transfer comment: Min A to steady ?  ? ?  ?Balance Overall balance assessment: Needs assistance ?Sitting-balance support: No upper extremity supported, Feet supported ?Sitting balance-Leahy Scale: Good ?  ?  ?Standing balance support: Single extremity supported, During functional activity ?Standing balance-Leahy Scale: Fair ?  ?  ?  ?  ?  ?  ?  ?  ?  ?  ?  ?  ?   ? ?ADL either performed or assessed with clinical judgement  ? ?ADL Overall ADL's : Needs assistance/impaired ?Eating/Feeding: Set up;Sitting ?  ?Grooming: Set up;Sitting ?  ?Upper Body Bathing: Min guard;Sitting ?  ?Lower Body Bathing: Minimal assistance;Sitting/lateral leans;Sit to/from stand ?  ?Upper Body Dressing : Set up;Sitting ?  ?Lower Body Dressing: Minimal assistance;Sitting/lateral leans;Sit to/from stand ?  ?Toilet Transfer: Minimal assistance;Ambulation ?  ?Toileting- Clothing Manipulation and Hygiene: Minimal assistance;Sitting/lateral lean;Sit to/from stand ?  ?  ?  ?Functional mobility during ADLs: Min guard;Minimal assistance ?General ADL Comments: Pt needs min assist for balance deficits and weakness  ? ? ? ?Vision Baseline Vision/History: 0 No visual deficits ?Ability to See in Adequate Light: 0 Adequate ?Patient Visual Report: No change from baseline ?Vision Assessment?: No apparent visual deficits  ?   ?Perception   ?  ?Praxis   ?  ? ?Pertinent Vitals/Pain Pain Assessment ?Pain Assessment: No/denies pain  ? ? ? ?Hand Dominance Right ?  ?Extremity/Trunk Assessment Upper  Extremity Assessment ?Upper Extremity Assessment: Overall WFL for tasks assessed ?  ?Lower Extremity Assessment ?Lower Extremity Assessment: Defer to PT evaluation ?  ?Cervical  / Trunk Assessment ?Cervical / Trunk Assessment: Normal ?  ?Communication Communication ?Communication: No difficulties ?  ?Cognition Arousal/Alertness: Awake/alert ?Behavior During Therapy: Flat affect ?Overall Cognitive Status: Within Functional Limits for tasks assessed ?  ?  ?  ?  ?  ?  ?  ?  ?  ?  ?  ?  ?  ?  ?  ?  ?General Comments: Pt following all commands, oriented, mildly slow response/process rate ?  ?  ?General Comments  VSS on RA ? ?  ?Exercises   ?  ?Shoulder Instructions    ? ? ?Home Living Family/patient expects to be discharged to:: Private residence ?Living Arrangements: Spouse/significant other ?Available Help at Discharge: Family ?Type of Home: House ?Home Access: Level entry ?  ?  ?Home Layout: One level ?  ?  ?Bathroom Shower/Tub: Walk-in shower ?  ?Bathroom Toilet: Handicapped height ?  ?  ?Home Equipment: None ?  ?  ?  ? ?  ?Prior Functioning/Environment Prior Level of Function : Independent/Modified Independent ?  ?  ?  ?  ?  ?  ?  ?  ?  ? ?  ?  ?OT Problem List: Decreased strength;Decreased activity tolerance;Impaired balance (sitting and/or standing);Decreased coordination;Decreased safety awareness ?  ?   ?OT Treatment/Interventions: Self-care/ADL training;Therapeutic exercise;Energy conservation;DME and/or AE instruction;Therapeutic activities;Patient/family education;Balance training  ?  ?OT Goals(Current goals can be found in the care plan section) Acute Rehab OT Goals ?Patient Stated Goal: To go home ?OT Goal Formulation: With patient ?Time For Goal Achievement: 08/29/21 ?Potential to Achieve Goals: Good ?ADL Goals ?Additional ADL Goal #1: Pt will follow 3-4 step commands/directional activities, independent. ?Additional ADL Goal #2: Pt will complete medication management task with no errors. ?Additional ADL Goal #3: Pt will verbalize and recognize 3 fall prevention techniques.  ?OT Frequency: Min 2X/week ?  ? ?Co-evaluation   ?  ?  ?  ?  ? ?  ?AM-PAC OT "6 Clicks" Daily Activity      ?Outcome Measure Help from another person eating meals?: None ?Help from another person taking care of personal grooming?: A Little ?Help from another person toileting, which includes using toliet, bedpan, or urinal?: A Little ?Help from another person bathing (including washing, rinsing, drying)?: A Little ?Help from another person to put on and taking off regular upper body clothing?: A Little ?Help from another person to put on and taking off regular lower body clothing?: A Little ?6 Click Score: 19 ?  ?End of Session Equipment Utilized During Treatment: Gait belt ?Nurse Communication: Mobility status ? ?Activity Tolerance: Patient tolerated treatment well ?Patient left: in chair;with call bell/phone within reach;with family/visitor present ? ?OT Visit Diagnosis: Unsteadiness on feet (R26.81);Other abnormalities of gait and mobility (R26.89);Muscle weakness (generalized) (M62.81)  ?              ?Time: 6962-9528 ?OT Time Calculation (min): 28 min ?Charges:  OT General Charges ?$OT Visit: 1 Visit ?OT Evaluation ?$OT Eval Moderate Complexity: 1 Mod ?OT Treatments ?$Therapeutic Activity: 8-22 mins ? ?Zakyah Yanes H., OTR/L ?Acute Rehabilitation ? ?Sulema Braid Elane Yolanda Bonine ?08/15/2021, 5:31 PM ?

## 2021-08-16 DIAGNOSIS — D638 Anemia in other chronic diseases classified elsewhere: Secondary | ICD-10-CM | POA: Diagnosis not present

## 2021-08-16 DIAGNOSIS — G9341 Metabolic encephalopathy: Secondary | ICD-10-CM | POA: Diagnosis not present

## 2021-08-16 DIAGNOSIS — I5022 Chronic systolic (congestive) heart failure: Secondary | ICD-10-CM | POA: Diagnosis not present

## 2021-08-16 DIAGNOSIS — N186 End stage renal disease: Secondary | ICD-10-CM | POA: Diagnosis not present

## 2021-08-16 LAB — RENAL FUNCTION PANEL
Albumin: 3 g/dL — ABNORMAL LOW (ref 3.5–5.0)
Anion gap: 11 (ref 5–15)
BUN: 22 mg/dL (ref 8–23)
CO2: 25 mmol/L (ref 22–32)
Calcium: 8.7 mg/dL — ABNORMAL LOW (ref 8.9–10.3)
Chloride: 94 mmol/L — ABNORMAL LOW (ref 98–111)
Creatinine, Ser: 10.1 mg/dL — ABNORMAL HIGH (ref 0.61–1.24)
GFR, Estimated: 5 mL/min — ABNORMAL LOW (ref >60)
Glucose, Bld: 83 mg/dL (ref 70–99)
Phosphorus: 6 mg/dL — ABNORMAL HIGH (ref 2.5–4.6)
Potassium: 4.2 mmol/L (ref 3.5–5.1)
Sodium: 130 mmol/L — ABNORMAL LOW (ref 135–145)

## 2021-08-16 LAB — CBC
HCT: 38.8 % — ABNORMAL LOW (ref 39.0–52.0)
Hemoglobin: 12.5 g/dL — ABNORMAL LOW (ref 13.0–17.0)
MCH: 27.9 pg (ref 26.0–34.0)
MCHC: 32.2 g/dL (ref 30.0–36.0)
MCV: 86.6 fL (ref 80.0–100.0)
Platelets: 150 10*3/uL (ref 150–400)
RBC: 4.48 MIL/uL (ref 4.22–5.81)
RDW: 14.6 % (ref 11.5–15.5)
WBC: 3.9 10*3/uL — ABNORMAL LOW (ref 4.0–10.5)
nRBC: 0 % (ref 0.0–0.2)

## 2021-08-16 LAB — GLUCOSE, CAPILLARY
Glucose-Capillary: 90 mg/dL (ref 70–99)
Glucose-Capillary: 83 mg/dL (ref 70–99)
Glucose-Capillary: 49 mg/dL — ABNORMAL LOW (ref 70–99)
Glucose-Capillary: 40 mg/dL — CL (ref 70–99)
Glucose-Capillary: 44 mg/dL — CL (ref 70–99)
Glucose-Capillary: 82 mg/dL (ref 70–99)

## 2021-08-16 NOTE — Progress Notes (Signed)
Patient given discharge instructions and stated understanding.  Patient refused HH with the case worker.  Patients wife is on the way with his clothing. ?

## 2021-08-16 NOTE — Progress Notes (Signed)
?[facility] ?Progress Note  ? ?Subjective:  Seen in room - feeling better. No CP/dyspnea. For d/c today, per notes. ? ?Objective ?Vitals:  ? 08/16/21 0000 08/16/21 0320 08/16/21 0800 08/16/21 0822  ?BP: 101/68 99/72  106/72  ?Pulse: 60 64  60  ?Resp: '19 16 20 19  '$ ?Temp:  98.6 ?F (37 ?C)  (!) 97.4 ?F (36.3 ?C)  ?TempSrc:  Oral  Oral  ?SpO2: 98% 97% 95%   ?Weight:      ?Height:      ? ?Physical Exam ?General: Well appearing man, NAD ?Heart: RRR; no murmur ?Lungs: CTAB ?Extremities: 2+ BLE edema ?Dialysis Access: [facility] ? ?Additional Objective ?Labs: ?Basic Metabolic Panel: ?Recent Labs  ?Lab 08/14/21 ?0756 08/15/21 ?4403 08/16/21 ?0715  ?NA 132* 131* 130*  ?K 4.8 4.2 4.2  ?CL 99 95* 94*  ?CO2 '26 27 25  '$ ?GLUCOSE 91 87 83  ?BUN 28* 17 22  ?CREATININE 12.06* 8.90* 10.10*  ?CALCIUM 8.4* 7.9* 8.7*  ?PHOS  --   --  6.0*  ? ?Liver Function Tests: ?Recent Labs  ?Lab 08/11/21 ?1300 08/12/21 ?0430 08/14/21 ?0756 08/16/21 ?0715  ?AST 17 12* 13*  --   ?ALT '6 6 6  '$ --   ?ALKPHOS 26* 28* 24*  --   ?BILITOT 1.1 0.9 1.2  --   ?PROT 5.9* 6.2* 4.9*  --   ?ALBUMIN 3.0* 3.3* 2.5* 3.0*  ? ?CBC: ?Recent Labs  ?Lab 08/11/21 ?1300 08/11/21 ?1607 08/12/21 ?0430 08/13/21 ?4742 08/14/21 ?0115 08/15/21 ?5956 08/16/21 ?0715  ?WBC 4.2  --  5.6 3.9* 3.7* 3.4* 3.9*  ?NEUTROABS 3.1  --   --   --   --   --   --   ?HGB 12.6*   < > 12.8* 11.6* 11.4* 10.9* 12.5*  ?HCT 41.5   < > 42.0 37.3* 36.6* 34.7* 38.8*  ?MCV 90.4  --  91.7 89.2 89.1 88.7 86.6  ?PLT 108*  --  144* 106* 120* 106* 150  ? < > = values in this interval not displayed.  ? ?BCBG: ?Recent Labs  ?Lab 08/15/21 ?1626 08/15/21 ?1941 08/15/21 ?2316 08/16/21 ?0405 08/16/21 ?3875  ?GLUCAP 98 114* 81 90 83  ? ?Medications: ? dextrose 30 mL/hr at 08/16/21 1000  ? ? apixaban  5 mg Oral BID  ? atropine  1 drop Left Eye TID  ? brimonidine  1 drop Left Eye TID  ? Chlorhexidine Gluconate Cloth  6 each Topical Q0600  ? cinacalcet  30 mg Oral Daily  ? dorzolamide-timolol  1 drop Left Eye TID  ?  metoprolol succinate  25 mg Oral QHS  ? multivitamin  1 tablet Oral QHS  ? polyethylene glycol  17 g Oral Daily  ? rosuvastatin  10 mg Oral Daily  ? senna-docusate  1 tablet Oral BID  ? sevelamer carbonate  1,600 mg Oral TID WC  ? ? ?Dialysis Orders: ?TTS at [facility] ?4:15hr, EDW 86kg, UFP #2, 2K/2Ca, TDC, no heparin ?- Calcitriol 68mg PO q HD ?- No ESA ? ?Assessment/Plan: ?Acute metabolic encephalopathy: Multifactorial - likely acyclovir toxicity + uremia. Neuro consulted, no further work-up rec. Improved.  ?ESRD: Continue HD on TTS schedule - next tomorrow, as outpatient. ?Anemia: Hgb > 12, no ESA for now. ?Secondary hyperparathyroidism -calcium stable, vitamin D on dialysis, Renvela binder, Sensipar continue follow-up ca phos labs ?HTN/volume -now only on metoprolol 25 mg XL HS , off losartan with lowish blood pressures.  No excess volume, yesterday UF 1.5 L. ?Chronic HFrEF/pulmonary hypertension:  Per cardiology last note 12/01, not a candidate for ICD due to infection risk continue low-dose beta-blocker. EF now less than 20% 2D echo 4/14 ? ?Veneta Penton, PA-C ?08/16/2021, 12:01 PM  ?[facility] ? ? ? ?

## 2021-08-16 NOTE — Plan of Care (Signed)

## 2021-08-16 NOTE — TOC Transition Note (Signed)
Transition of Care (TOC) - CM/SW Discharge Note ? ? ?Patient Details  ?Name: Michael [name]. ?[name]: [id number] ?Date of Birth: [birth year] ? ?Transition of Care (TOC) CM/SW Contact:  ?Cyndi Bender, RN ?Phone Number: ?08/16/2021, 11:15 AM ? ? ?Clinical Narrative:    ?Patient stable for discharge. Orders for Home health. Patient declines home health. Wife able to transport patient home ? ?Final next level of care: Home/Self Care ?Barriers to Discharge: Barriers Resolved ? ? ?Patient Goals and CMS Choice ?Patient states their goals for this hospitalization and ongoing recovery are:: retuen home ?  ?  ? ?Discharge Placement ?  ?           ?  ? home ?  ?  ? ?Discharge Plan and Services ?  ?Discharge Planning Services: CM Consult ?           ?  ?  ?  ?  ?  ?  ?  ?  ?  ?  ? ?Social Determinants of Health (SDOH) Interventions ?  ? ? ?Readmission Risk Interventions ? ?  08/16/2021  ? 11:14 AM  ?Readmission Risk Prevention Plan  ?Transportation Screening Complete  ?PCP or Specialist Appt within 3-5 Days Complete  ?[place] or Home Care Consult Complete  ?Social Work Consult for [place] Planning/Counseling Complete  ?Palliative Care Screening Not Applicable  ?Medication Review Press photographer) Complete  ? ? ? ? ? ?

## 2021-08-16 NOTE — Discharge Summary (Signed)
? ?PATIENT DETAILS ?Name: Michael [name]. ?Age: 66 y.o. ?Sex: male ?Date of Birth: 1956/04/01 ?[name]: [id number]. ?Admitting Physician: Jonnie Finner, DO ?QHU:TMLYY, Herbie [place], MD ? ?Admit Date: 08/11/2021 ?Discharge date: 08/16/2021 ? ?Recommendations for Outpatient Follow-up:  ?Follow up with PCP in 1-2 weeks ?Please obtain CMP/CBC in one week ?Acyclovir on hold (see below) ?Please ensure follow-up with ophthalmology ?Please ensure follow-up nephrology/dialysis clinic. ?Please ensure follow-up with cardiology ? ?Admitted From:  ?Home ? ?Disposition: ?Home health ?  ?Discharge Condition: ?good ? ?CODE STATUS: ?  Code Status: Full Code  ? ?Diet recommendation:  ?Diet Order   ? ?       ?  Diet - low sodium heart healthy       ?  ?  Diet renal with fluid restriction Fluid restriction: 1200 mL Fluid; Room service appropriate? Yes; Fluid consistency: Thin  Diet effective now       ?  ? ?  ?  ? ?  ?  ? ?Brief Summary: ?Patient is a 66 y.o.  male with history of HFrEF, persistent atrial fibrillation, ESRD on HD, HTN, HLD, dendritic herpes keratitis, central retinal vein occlusion-who presented to the hospital with altered mental status.He was thought to have acute toxic/metabolic encephalopathy from missed HD/and accumulation of acyclovir.  He was subsequently admitted to the hospitalist service. ?  ?Significant events: ?4/12>> admit to [facility] for confusion. ?  ?Significant studies: ?4/12>> CT head: No acute intracranial abnormality ?4/12>> MRI brain: No acute intracranial abnormality ?4/14>> Echo: TK<35%, grade 3 diastolic dysfunction, RV systolic function severely reduced.  RVSP 54.9. ?  ?Significant microbiology data: ?4/12>> COVID/influenza PCR: Negative ?4/15>> blood culture: No growth ?  ?Procedures: ?None ?  ?Consults: ?Nephrology, neurology ? ? ?Brief Hospital Course: ?Acute toxic metabolic encephalopathy: Due to a combination of uremia from missed HD and acyclovir toxicity.  Encephalopathy has resolved-mental status is  back to baseline after holding acyclovir and initiating HD.  Neurology briefly followed-lumbar puncture was initially contemplated but given rapid clinical improvement-they have signed off-not felt to require lumbar puncture anymore.   ?  ?ESRD on HD TTS: Nephrology followed closely and directed HD care ? ?Normocytic anemia: Due to ESRD-defer Aranesp/iron to nephrology service. ?  ?Thrombocytopenia: Chronic issue-mild-stable for further follow-up in the outpatient setting. ?  ?Chronic HFrEF/pulmonary hypertension: Relatively compensated-volume removal with HD.  Reviewed last cardiology note on 12/1-not a candidate for ICD due to infection risk.  Continue beta-blocker-losartan-blood pressure soft and will need to be watched closely. ?  ?Persistent atrial fibrillation: Continue telemetry monitoring-on Eliquis. ?  ?History of herpes keratitis: On maintenance acyclovir-followed by ophthalmology-Acyclovir currently on hold.  Discussed with patient-he is not keen on resuming acyclovir-he feels that it "messes him up".  I have asked him/spouse to ensure he has quick outpatient follow-up with ophthalmology posthospital discharge for further continued care. ?  ?Debility/deconditioning: Due to acute illness-has not ambulated yet since hospitalization-mental status now much improved-evaluated by PT/OT-recommendations are for home health. ?  ?BMI: ?Estimated body mass index is 29.52 kg/m? as calculated from the following: ?  Height as of this encounter: '5\' 7"'$  (1.702 m). ?  Weight as of this encounter: 85.5 kg.  ?  ? ?Discharge Diagnoses:  ?Principal Problem: ?  Acute metabolic encephalopathy ?Active Problems: ?  Anemia of chronic disease ?  Essential hypertension ?  End stage renal disease ([place]) ?  Chronic systolic heart failure ([place]) ?  Hyperlipidemia ?  Herpes keratitis ?  Hypoglycemia ?  Hyperkalemia ? ? ?Discharge  Instructions: ? ?Activity:  ?As tolerated ? ?Discharge Instructions   ? ? (HEART FAILURE PATIENTS) Call MD:   Anytime you have any of the following symptoms: 1) 3 pound weight gain in 24 hours or 5 pounds in 1 week 2) shortness of breath, with or without a dry hacking cough 3) swelling in the hands, feet or stomach 4) if you have to sleep on extra pillows at night in order to breathe.   Complete by: As directed ?  ? Call MD for:  difficulty breathing, headache or visual disturbances   Complete by: As directed ?  ? Diet - low sodium heart healthy   Complete by: As directed ?  ? Increase activity slowly   Complete by: As directed ?  ? ?  ? ?Allergies as of 08/16/2021   ? ?   Reactions  ? Colchicine Other (See Comments)  ? Other reaction(s): foot pain  ? Indapamide Other (See Comments)  ? unknown  ? Lipitor [atorvastatin] Itching  ? Leg pain  ? Viagra [sildenafil] Other (See Comments)  ? Feels weird - Levitra. - headache  ? Zyloprim [allopurinol] Other (See Comments)  ? foot pain  ? ?  ? ?  ?Medication List  ?  ? ?STOP taking these medications   ? ?acetaZOLAMIDE 250 MG tablet ?Commonly known as: DIAMOX ?  ?acyclovir 400 MG tablet ?Commonly known as: ZOVIRAX ?  ?spironolactone 25 MG tablet ?Commonly known as: ALDACTONE ?  ? ?  ? ?TAKE these medications   ? ?atropine 1 % ophthalmic solution ?Place 1 drop into the left eye in the morning and at bedtime. ?  ?brimonidine 0.2 % ophthalmic solution ?Commonly known as: ALPHAGAN ?Place 1 drop into the left eye 3 (three) times daily. ?  ?cinacalcet 30 MG tablet ?Commonly known as: SENSIPAR ?Take 30 mg by mouth daily. ?  ?clotrimazole-betamethasone cream ?Commonly known as: Lotrisone ?Apply 1 application topically 2 (two) times daily. ?  ?dorzolamide-timolol 22.3-6.8 MG/ML ophthalmic solution ?Commonly known as: COSOPT ?Place 1 drop into the left eye 3 (three) times daily. ?  ?Eliquis 5 MG Tabs tablet ?Generic drug: apixaban ?Take 1 tablet by mouth twice daily ?What changed:  ?how much to take ?when to take this ?  ?losartan 25 MG tablet ?Commonly known as: COZAAR ?Take 0.5 tablets (12.5  mg total) by mouth daily. ?What changed: how much to take ?  ?Lotemax SM 0.38 % Gel ?Generic drug: Loteprednol Etabonate ?Place 1 drop into the left eye every 2 (two) hours. ?  ?metoprolol succinate 25 MG 24 hr tablet ?Commonly known as: Toprol XL ?Take 1 tablet (25 mg total) by mouth at bedtime. ?  ?Muro 128 2 % ophthalmic solution ?Generic drug: sodium chloride ?Place 1 drop into the left eye 4 (four) times daily. ?  ?nitroGLYCERIN 0.4 MG SL tablet ?Commonly known as: NITROSTAT ?Place 1 tablet (0.4 mg total) under the tongue every 5 (five) minutes x 3 doses as needed for chest pain. ?  ?rosuvastatin 10 MG tablet ?Commonly known as: CRESTOR ?Take 1 tablet (10 mg total) by mouth daily. ?  ?sevelamer carbonate 800 MG tablet ?Commonly known as: RENVELA ?Take 1,600 mg by mouth 3 (three) times daily with meals. ?  ? ?  ? ? Follow-up Information   ? ? [name], MD. Schedule an appointment as soon as possible for a visit in 1 week(s).   ?Specialty: Family Medicine ?Contact information: ?[address] ?[address] ?[place] [address] ?[phone] ? ? ?  ?  ? ?  Evans Lance, MD Follow up in 1 month(s).   ?Specialty: Cardiology ?Contact information: ?[address] ?[address] ?[place] [address] ?[phone] ? ? ?  ?  ? ? Early Osmond, MD. Schedule an appointment as soon as possible for a visit in 2 week(s).   ?Specialty: Cardiology ?Contact information: ?[address] ?[place] [address] ?[phone] ? ? ?  ?  ? ?  ?  ? ?  ? ?Allergies  ?Allergen Reactions  ? Colchicine Other (See Comments)  ?  Other reaction(s): foot pain  ? Indapamide Other (See Comments)  ?  unknown  ? Lipitor [Atorvastatin] Itching  ?  Leg pain  ? Viagra [Sildenafil] Other (See Comments)  ?  Feels weird - Levitra. - headache  ? Zyloprim [Allopurinol] Other (See Comments)  ?  foot pain  ? ? ? ?Other Procedures/Studies: ?CT Head Wo Contrast ? ?Result Date: 08/11/2021 ?CLINICAL DATA:  Transient ischemic attack (TIA)  EXAM: CT HEAD WITHOUT CONTRAST TECHNIQUE: Contiguous axial images were obtained from the base of the skull through the vertex without intravenous contrast. RADIATION DOSE REDUCTION: This exam was performe

## 2021-08-16 NOTE — Progress Notes (Signed)
This chaplain responded to PMT consult for creating/updating the Pt. Advance Directive. The chaplain checked in with the Pt. RN before the visit.  ? ?The Pt. is standing at the sink preparing to "clean up" at the time of the visit. The chaplain understands the Pt. does not have an interest in completing an Advance Directive at this time. The Pt. shares with the chaplain his wife-[name] is the Pt. surrogate decision maker. ? ?This chaplain is available for F/U spiritual care as needed. ? ?Chaplain Sallyanne Kuster ?[phone] ?

## 2021-08-16 NOTE — Plan of Care (Signed)
?  Problem: Education: ?Goal: Knowledge of General Education information will improve ?Description: Including pain rating scale, medication(s)/side effects and non-pharmacologic comfort measures ?08/16/2021 1104 by Ginnie Smart, RN ?Outcome: Adequate for Discharge ?08/16/2021 1103 by Ginnie Smart, RN ?Outcome: Progressing ?  ?Problem: Health Behavior/Discharge Planning: ?Goal: Ability to manage health-related needs will improve ?08/16/2021 1104 by Ginnie Smart, RN ?Outcome: Adequate for Discharge ?08/16/2021 1103 by Ginnie Smart, RN ?Outcome: Progressing ?  ?Problem: Clinical Measurements: ?Goal: Ability to maintain clinical measurements within normal limits will improve ?08/16/2021 1104 by Ginnie Smart, RN ?Outcome: Adequate for Discharge ?08/16/2021 1103 by Ginnie Smart, RN ?Outcome: Progressing ?Goal: Will remain free from infection ?08/16/2021 1104 by Ginnie Smart, RN ?Outcome: Adequate for Discharge ?08/16/2021 1103 by Ginnie Smart, RN ?Outcome: Progressing ?Goal: Diagnostic test results will improve ?08/16/2021 1104 by Ginnie Smart, RN ?Outcome: Adequate for Discharge ?08/16/2021 1103 by Ginnie Smart, RN ?Outcome: Progressing ?Goal: Respiratory complications will improve ?08/16/2021 1104 by Ginnie Smart, RN ?Outcome: Adequate for Discharge ?08/16/2021 1103 by Ginnie Smart, RN ?Outcome: Progressing ?Goal: Cardiovascular complication will be avoided ?08/16/2021 1104 by Ginnie Smart, RN ?Outcome: Adequate for Discharge ?08/16/2021 1103 by Ginnie Smart, RN ?Outcome: Progressing ?  ?Problem: Activity: ?Goal: Risk for activity intolerance will decrease ?08/16/2021 1104 by Ginnie Smart, RN ?Outcome: Adequate for Discharge ?08/16/2021 1103 by Ginnie Smart, RN ?Outcome: Progressing ?  ?Problem: Nutrition: ?Goal: Adequate nutrition will be maintained ?08/16/2021 1104 by Ginnie Smart, RN ?Outcome: Adequate for Discharge ?08/16/2021 1103 by Ginnie Smart, RN ?Outcome: Progressing ?   ?Problem: Coping: ?Goal: Level of anxiety will decrease ?08/16/2021 1104 by Ginnie Smart, RN ?Outcome: Adequate for Discharge ?08/16/2021 1103 by Ginnie Smart, RN ?Outcome: Progressing ?  ?Problem: Elimination: ?Goal: Will not experience complications related to bowel motility ?08/16/2021 1104 by Ginnie Smart, RN ?Outcome: Adequate for Discharge ?08/16/2021 1103 by Ginnie Smart, RN ?Outcome: Progressing ?Goal: Will not experience complications related to urinary retention ?08/16/2021 1104 by Ginnie Smart, RN ?Outcome: Adequate for Discharge ?08/16/2021 1103 by Ginnie Smart, RN ?Outcome: Progressing ?  ?Problem: Pain Managment: ?Goal: General experience of comfort will improve ?08/16/2021 1104 by Ginnie Smart, RN ?Outcome: Adequate for Discharge ?08/16/2021 1103 by Ginnie Smart, RN ?Outcome: Progressing ?  ?Problem: Safety: ?Goal: Ability to remain free from injury will improve ?08/16/2021 1104 by Ginnie Smart, RN ?Outcome: Adequate for Discharge ?08/16/2021 1103 by Ginnie Smart, RN ?Outcome: Progressing ?  ?Problem: Skin Integrity: ?Goal: Risk for impaired skin integrity will decrease ?08/16/2021 1104 by Ginnie Smart, RN ?Outcome: Adequate for Discharge ?08/16/2021 1103 by Ginnie Smart, RN ?Outcome: Progressing ?  ?Problem: Acute Rehab PT Goals(only PT should resolve) ?Goal: Pt Will Go Supine/Side To Sit ?Outcome: Adequate for Discharge ?Goal: Patient Will Transfer Sit To/From Stand ?Outcome: Adequate for Discharge ?Goal: Pt Will Ambulate ?Outcome: Adequate for Discharge ?  ?Problem: Acute Rehab OT Goals (only OT should resolve) ?Goal: OT Additional ADL Goal #1 ?Outcome: Adequate for Discharge ?Goal: OT Additional ADL Goal #2 ?Outcome: Adequate for Discharge ?Goal: OT Additional ADL Goal #3 ?Outcome: Adequate for Discharge ?  ?

## 2021-08-16 NOTE — Progress Notes (Signed)
D/C order noted. Contacted [facility] and spoke to [place]. Clinic advised pt will d/c today and will resume care tomorrow.  ? ?Melven Sartorius ?Renal Navigator ?[phone] ?

## 2021-08-16 NOTE — Care Management Important Message (Signed)
Important Message ? ?Patient Details  ?Name: Michael [name]. ?[name]: [id number] ?Date of Birth: 24-Sep-1955 ? ? ?Medicare Important Message Given:  Yes ? ? ? ? ?Justus Duerr ?08/16/2021, 3:48 PM ?

## 2021-08-17 ENCOUNTER — Ambulatory Visit (INDEPENDENT_AMBULATORY_CARE_PROVIDER_SITE_OTHER): Payer: Medicare Other | Admitting: Ophthalmology

## 2021-08-17 ENCOUNTER — Encounter (INDEPENDENT_AMBULATORY_CARE_PROVIDER_SITE_OTHER): Payer: Self-pay | Admitting: Ophthalmology

## 2021-08-17 DIAGNOSIS — N2581 Secondary hyperparathyroidism of renal origin: Secondary | ICD-10-CM | POA: Diagnosis not present

## 2021-08-17 DIAGNOSIS — H34831 Tributary (branch) retinal vein occlusion, right eye, with macular edema: Secondary | ICD-10-CM

## 2021-08-17 DIAGNOSIS — H35412 Lattice degeneration of retina, left eye: Secondary | ICD-10-CM | POA: Diagnosis not present

## 2021-08-17 DIAGNOSIS — H34812 Central retinal vein occlusion, left eye, with macular edema: Secondary | ICD-10-CM

## 2021-08-17 DIAGNOSIS — H33332 Multiple defects of retina without detachment, left eye: Secondary | ICD-10-CM

## 2021-08-17 DIAGNOSIS — H25813 Combined forms of age-related cataract, bilateral: Secondary | ICD-10-CM | POA: Diagnosis not present

## 2021-08-17 DIAGNOSIS — H4052X2 Glaucoma secondary to other eye disorders, left eye, moderate stage: Secondary | ICD-10-CM

## 2021-08-17 DIAGNOSIS — D631 Anemia in chronic kidney disease: Secondary | ICD-10-CM | POA: Diagnosis not present

## 2021-08-17 DIAGNOSIS — I10 Essential (primary) hypertension: Secondary | ICD-10-CM

## 2021-08-17 DIAGNOSIS — N186 End stage renal disease: Secondary | ICD-10-CM | POA: Diagnosis not present

## 2021-08-17 DIAGNOSIS — H35033 Hypertensive retinopathy, bilateral: Secondary | ICD-10-CM | POA: Diagnosis not present

## 2021-08-17 DIAGNOSIS — B0052 Herpesviral keratitis: Secondary | ICD-10-CM

## 2021-08-17 DIAGNOSIS — H1820 Unspecified corneal edema: Secondary | ICD-10-CM

## 2021-08-17 DIAGNOSIS — Z992 Dependence on renal dialysis: Secondary | ICD-10-CM | POA: Diagnosis not present

## 2021-08-17 DIAGNOSIS — H43813 Vitreous degeneration, bilateral: Secondary | ICD-10-CM | POA: Diagnosis not present

## 2021-08-17 DIAGNOSIS — H209 Unspecified iridocyclitis: Secondary | ICD-10-CM | POA: Diagnosis not present

## 2021-08-17 NOTE — Progress Notes (Signed)
?[facility] - Clinic Note ? ?08/17/2021 ?  ? ?CHIEF COMPLAINT ?Patient presents for Retina Follow Up ? ? ?HISTORY OF PRESENT ILLNESS: ?Michael Borgerding Jr. is a 66 y.o. male who presents to the clinic today for:  ? ?HPI   ? ? Retina Follow Up   ?Patient presents with  CRVO/BRVO.  In left eye.  Severity is moderate.  Duration of 8 days.  Since onset it is stable.  I, the attending physician,  performed the HPI with the patient and updated documentation appropriately. ? ?  ?  ? ? Comments   ?Pt here for 8 day ret f/u for K check OS. Pt states VA is about the same. Pt just got discharged from the hospital due to acute metabolic encephalopathy. Pt was here on 4/10 then symptoms began 4/11. Pt was sleeping a lot and had an altered mental status, was speaking gibberish. Was admitted to [facility] 4/12 then discharged 4/17. Pt is on the mend now but was taken off of the Diamox prescription. He reports receiving drops while in the hospital but did not take any this AM.  ? ?  ?  ?Last edited by Zamora, Brian, MD on 08/18/2021 12:08 AM.  ?  ?Pt was hospitalized on April 11 with altered mental status, he was released yesterday, he was taken off diamox and acyclovir, given drops in hospital ? ?Referring physician: ?Reade, Robert, MD ?[address] ?[address] ?[place],  [place] [address] ? ?HISTORICAL INFORMATION:  ? ?Selected notes from the medical record:  ?Referred by Dr. [name] for concern of HRVO OD; ?LEE- 11.30.18 ([name]) {BCVA OD: 20/100-1 OS: 20/20-2] ?Ocular Hx- cataract OU ?PMH- HTN, high chol, kidney disease, sleep apnea, emphysema ?  ? ?CURRENT MEDICATIONS: ?Current Outpatient Medications (Ophthalmic Drugs)  ?Medication Sig  ? atropine 1 % ophthalmic solution Place 1 drop into the left eye in the morning and at bedtime.  ? brimonidine (ALPHAGAN) 0.2 % ophthalmic solution Place 1 drop into the left eye 3 (three) times daily.  ? dorzolamide-timolol (COSOPT) 22.3-6.8 MG/ML ophthalmic solution Place  1 drop into the left eye 3 (three) times daily.  ? Loteprednol Etabonate (LOTEMAX SM) 0.38 % GEL Place 1 drop into the left eye every 2 (two) hours.  ? sodium chloride (MURO 128) 2 % ophthalmic solution Place 1 drop into the left eye 4 (four) times daily.  ? ?No current facility-administered medications for this visit. (Ophthalmic Drugs)  ? ?Current Outpatient Medications (Other)  ?Medication Sig  ? apixaban (ELIQUIS) 5 MG TABS tablet Take 1 tablet by mouth twice daily (Patient taking differently: Take 5 mg by mouth daily.)  ? cinacalcet (SENSIPAR) 30 MG tablet Take 30 mg by mouth daily.  ? clotrimazole-betamethasone (LOTRISONE) cream Apply 1 application topically 2 (two) times daily.  ? losartan (COZAAR) 25 MG tablet Take 0.5 tablets (12.5 mg total) by mouth daily. (Patient taking differently: Take 25 mg by mouth daily.)  ? metoprolol succinate (TOPROL XL) 25 MG 24 hr tablet Take 1 tablet (25 mg total) by mouth at bedtime.  ? nitroGLYCERIN (NITROSTAT) 0.4 MG SL tablet Place 1 tablet (0.4 mg total) under the tongue every 5 (five) minutes x 3 doses as needed for chest pain.  ? rosuvastatin (CRESTOR) 10 MG tablet Take 1 tablet (10 mg total) by mouth daily.  ? sevelamer carbonate (RENVELA) 800 MG tablet Take 1,600 mg by mouth 3 (three) times daily with meals.  ? ?No current facility-administered medications for this   visit. (Other)  ? ?REVIEW OF SYSTEMS: ?ROS   ?Positive for: Genitourinary, Cardiovascular, Eyes, Respiratory ?Negative for: Constitutional, Gastrointestinal, Neurological, Skin, Musculoskeletal, HENT, Endocrine, Psychiatric, Allergic/Imm, Heme/Lymph ?Last edited by Kingsley Spittle, COT on 08/17/2021  8:53 AM.  ?  ? ?ALLERGIES ?Allergies  ?Allergen Reactions  ? Colchicine Other (See Comments)  ?  Other reaction(s): foot pain  ? Indapamide Other (See Comments)  ?  unknown  ? Lipitor [Atorvastatin] Itching  ?  Leg pain  ? Viagra [Sildenafil] Other (See Comments)  ?  Feels weird - Levitra. - headache  ?  Zyloprim [Allopurinol] Other (See Comments)  ?  foot pain  ? ?PAST MEDICAL HISTORY ?Past Medical History:  ?Diagnosis Date  ? Atrial arrhythmia   ? atrial tachycardia with variable AV conduction versus atypical aflutter 01/10/19, rate control (02/06/19)  ? Cataract   ? Dyspnea   ? on exertion  ? ESRD (end stage renal disease) ([place])   ? Hemo- MWF, Polycystic kidney disease  ? Fatigue   ? History of kidney stones   ? removal of stone- cysto  ? Hyperlipidemia   ? Hyperparathyroidism, secondary renal ([place])   ? Hypertension   ? Hypoxemia 12/12/2013  ? Nonischemic cardiomyopathy ([place])   ? Er 25% 2015, 55 % 2013  ? OSA on CPAP   ? no longer using cpap  ? OSA on CPAP 03/24/2014  ? Pneumonia   ? 2015ish  ? Prostate cancer ([place])   ? Ventricular tachycardia//Freq PVCs   ? Wears glasses   ? ?Past Surgical History:  ?Procedure Laterality Date  ? A-FLUTTER ABLATION N/A 04/11/2019  ? Procedure: A-FLUTTER ABLATION;  Surgeon: Evans Lance, MD;  Location: [facility];  Service: Cardiovascular;  Laterality: N/A;  ? A/V FISTULAGRAM Left 04/27/2017  ? Procedure: A/V FISTULAGRAM;  Surgeon: Conrad [place], MD;  Location: [facility];  Service: Cardiovascular;  Laterality: Left;  lt arm  ? A/V FISTULAGRAM Left 01/10/2019  ? Procedure: A/V FISTULAGRAM;  Surgeon: Marty Heck, MD;  Location: [facility];  Service: Cardiovascular;  Laterality: Left;  ? APPENDECTOMY    ? AV FISTULA PLACEMENT  12/05/2011  ? Procedure: ARTERIOVENOUS (AV) FISTULA CREATION;LLEFT ARM  Surgeon: Conrad Claiborne, MD;  Location: [facility];  Service: Vascular;  Laterality: Left;  RADIO-CEPHALIC  fistula left arm  ? AV FISTULA PLACEMENT  01/11/2012  ? Procedure: ARTERIOVENOUS (AV) FISTULA CREATION;  Surgeon: Conrad Dawson, MD;  Location: [facility];  Service: Vascular;  Laterality: Left;  Creation of left brachial cephalic arteriovenous fistula  ? AV FISTULA PLACEMENT Right 03/07/2019  ? Procedure: ARTERIOVENOUS (AV) FISTULA CREATION  RIGHT ARM;  Surgeon:  Marty Heck, MD;  Location: [facility];  Service: Vascular;  Laterality: Right;  ? BASCILIC VEIN TRANSPOSITION Left 12/27/2016  ? Procedure: BASILIC VEIN TRANSPOSITION LEFT UPPER ARM FIRST STAGE;  Surgeon: Conrad Dawson, MD;  Location: [facility];  Service: Vascular;  Laterality: Left;  ? BASCILIC VEIN TRANSPOSITION Left 01/31/2017  ? Procedure: LEFT ARM BASILIC VEIN TRANSPOSITION, SECOND STAGE;  Surgeon: Conrad Rockdale, MD;  Location: [facility];  Service: Vascular;  Laterality: Left;  ? CARDIAC CATHETERIZATION  04-05-2010  ? checking for blockage but none-[facility]  ? COLONOSCOPY    ? CYSTOSCOPY W/ STONE MANIPULATION    ? "laser once" (01/22/2013)  ? HEMATOMA EVACUATION Left 05/09/2017  ? Procedure: EVACUATION HEMATOMA LEFT ARM;  Surgeon: Conrad Leighton, MD;  Location: [facility];  Service: Vascular;  Laterality: Left;  ? HERNIA REPAIR    ? INGUINAL HERNIA REPAIR Right 11/06/2015  ? Procedure: OPEN HERNIA REPAIR  RIGHT INGUINAL ADULT;  Surgeon: Matthew [name], MD;  [facility];  Service: General;  Laterality: Right;  with MESH  ? INSERTION OF DIALYSIS CATHETER Right 10/05/2016  ? Procedure: INSERTION OF right internal jugular DIALYSIS CATHETER;  Surgeon: Early, Todd F, MD;  [facility];  Service: Vascular;  Laterality: Right;  ? INSERTION OF MESH  03/20/2012  ? Procedure: INSERTION OF MESH;  UMB Surgeon: Matthew Wakefield, MD;  [facility];  Service: General;  Laterality: N/A;  ? INSERTION OF MESH N/A 01/22/2013  ? Procedure: INSERTION OF MESH;  Surgeon: Matthew Wakefield, MD;  [facility];  Service: General;  Laterality: N/A;  ? LAPAROTOMY  04/02/2012  ? Procedure: EXPLORATORY LAPAROTOMY;  Surgeon: Matthew Wakefield, MD;  [facility];  Service: General;  Laterality: N/A;  Exploratory Laparotomy with resection of small intestine  ? LEFT HEART CATHETERIZATION WITH CORONARY ANGIOGRAM N/A 05/14/2013  ? Procedure: LEFT HEART CATHETERIZATION WITH CORONARY ANGIOGRAM;  Surgeon: Henry W Smith III, MD;  [facility];   Service: Cardiovascular;  Laterality: N/A;  ? LIGATION OF ARTERIOVENOUS  FISTULA Left 12/27/2016  ? Procedure: LIGATION/EXCISION OF LEFT UPPER ARM ARTERIOVENOUS  FISTULA;  EVACUATION OF HEMATOMA;  Surgeon: Chen, B

## 2021-08-18 ENCOUNTER — Encounter (INDEPENDENT_AMBULATORY_CARE_PROVIDER_SITE_OTHER): Payer: Self-pay | Admitting: Ophthalmology

## 2021-08-19 ENCOUNTER — Encounter (INDEPENDENT_AMBULATORY_CARE_PROVIDER_SITE_OTHER): Payer: Self-pay | Admitting: Ophthalmology

## 2021-08-19 ENCOUNTER — Ambulatory Visit (INDEPENDENT_AMBULATORY_CARE_PROVIDER_SITE_OTHER): Payer: Medicare Other | Admitting: Ophthalmology

## 2021-08-19 DIAGNOSIS — Z992 Dependence on renal dialysis: Secondary | ICD-10-CM | POA: Diagnosis not present

## 2021-08-19 DIAGNOSIS — H1820 Unspecified corneal edema: Secondary | ICD-10-CM | POA: Diagnosis not present

## 2021-08-19 DIAGNOSIS — H34812 Central retinal vein occlusion, left eye, with macular edema: Secondary | ICD-10-CM | POA: Diagnosis not present

## 2021-08-19 DIAGNOSIS — N2581 Secondary hyperparathyroidism of renal origin: Secondary | ICD-10-CM | POA: Diagnosis not present

## 2021-08-19 DIAGNOSIS — H4052X2 Glaucoma secondary to other eye disorders, left eye, moderate stage: Secondary | ICD-10-CM

## 2021-08-19 DIAGNOSIS — N186 End stage renal disease: Secondary | ICD-10-CM | POA: Diagnosis not present

## 2021-08-19 DIAGNOSIS — H25813 Combined forms of age-related cataract, bilateral: Secondary | ICD-10-CM | POA: Diagnosis not present

## 2021-08-19 DIAGNOSIS — H33332 Multiple defects of retina without detachment, left eye: Secondary | ICD-10-CM

## 2021-08-19 DIAGNOSIS — H209 Unspecified iridocyclitis: Secondary | ICD-10-CM

## 2021-08-19 DIAGNOSIS — H35033 Hypertensive retinopathy, bilateral: Secondary | ICD-10-CM | POA: Diagnosis not present

## 2021-08-19 DIAGNOSIS — R4182 Altered mental status, unspecified: Secondary | ICD-10-CM | POA: Diagnosis not present

## 2021-08-19 DIAGNOSIS — H43813 Vitreous degeneration, bilateral: Secondary | ICD-10-CM | POA: Diagnosis not present

## 2021-08-19 DIAGNOSIS — H35412 Lattice degeneration of retina, left eye: Secondary | ICD-10-CM

## 2021-08-19 DIAGNOSIS — B0052 Herpesviral keratitis: Secondary | ICD-10-CM | POA: Diagnosis not present

## 2021-08-19 DIAGNOSIS — I10 Essential (primary) hypertension: Secondary | ICD-10-CM | POA: Diagnosis not present

## 2021-08-19 DIAGNOSIS — D631 Anemia in chronic kidney disease: Secondary | ICD-10-CM | POA: Diagnosis not present

## 2021-08-19 DIAGNOSIS — H34831 Tributary (branch) retinal vein occlusion, right eye, with macular edema: Secondary | ICD-10-CM | POA: Diagnosis not present

## 2021-08-19 LAB — CULTURE, BLOOD (ROUTINE X 2)
Culture: NO GROWTH
Culture: NO GROWTH
Special Requests: ADEQUATE

## 2021-08-19 NOTE — Progress Notes (Signed)
?[facility] Note ? ?08/19/2021 ?  ? ?CHIEF COMPLAINT ?Patient presents for Retina Follow Up ? ? ?HISTORY OF PRESENT ILLNESS: ?Michael [name]. is a 66 y.o. male who presents to the clinic today for:  ? ?HPI   ? ? Retina Follow Up   ?Patient presents with  CRVO/BRVO.  In left eye.  Severity is moderate.  Duration of 2 days.  Since onset it is stable.  I, the attending physician,  performed the HPI with the patient and updated documentation appropriately. ? ?  ?  ? ? Comments   ?Patient states that he is using the Pred Forte OD QID, Lotemax OS Q2H, Vyzulta OS QHS, Atropine OS BID, and Muro OS QID. ?Patient feels that the vision in the right eye is getting clearer.  ? ?  ?  ?Last edited by [name], MD on 08/19/2021 12:08 PM.  ?  ?Pt states his left eye is not bothering him except for the vision, he has no eye pain ? ?Referring physician: ?[name], MD ?[address] ?[address] ?[place],   [id number] ? ?HISTORICAL INFORMATION:  ? ?Selected notes from the [facility] ?Referred by Dr. [name]. Hecker for concern of HRVO OD; ?LEE- 11.30.18 ([name]) {BCVA OD: 20/100-1 OS: 20/20-2] ?Ocular Hx- cataract OU ?PMH- HTN, high chol, kidney disease, sleep apnea, emphysema ?  ? ?CURRENT MEDICATIONS: ?Current Outpatient Medications (Ophthalmic Drugs)  ?Medication Sig  ? atropine 1 % ophthalmic solution Place 1 drop into the left eye in the morning and at bedtime.  ? brimonidine (ALPHAGAN) 0.2 % ophthalmic solution Place 1 drop into the left eye 3 (three) times daily.  ? dorzolamide-timolol (COSOPT) 22.3-6.8 MG/ML ophthalmic solution Place 1 drop into the left eye 3 (three) times daily.  ? Loteprednol Etabonate (LOTEMAX SM) 0.38 % GEL Place 1 drop into the left eye every 2 (two) hours.  ? sodium chloride (MURO 128) 2 % ophthalmic solution Place 1 drop into the left eye 4 (four) times daily.  ? ?No current facility-administered medications for this visit. (Ophthalmic Drugs)   ? ?Current Outpatient Medications (Other)  ?Medication Sig  ? apixaban (ELIQUIS) 5 MG TABS tablet Take 1 tablet by mouth twice daily (Patient taking differently: Take 5 mg by mouth daily.)  ? cinacalcet (SENSIPAR) 30 MG tablet Take 30 mg by mouth daily.  ? clotrimazole-betamethasone (LOTRISONE) cream Apply 1 application topically 2 (two) times daily.  ? losartan (COZAAR) 25 MG tablet Take 0.5 tablets (12.5 mg total) by mouth daily. (Patient taking differently: Take 25 mg by mouth daily.)  ? metoprolol succinate (TOPROL XL) 25 MG 24 hr tablet Take 1 tablet (25 mg total) by mouth at bedtime.  ? nitroGLYCERIN (NITROSTAT) 0.4 MG SL tablet Place 1 tablet (0.4 mg total) under the tongue every 5 (five) minutes x 3 doses as needed for chest pain.  ? rosuvastatin (CRESTOR) 10 MG tablet Take 1 tablet (10 mg total) by mouth daily.  ? sevelamer carbonate (RENVELA) 800 MG tablet Take 1,600 mg by mouth 3 (three) times daily with meals.  ? ?No current facility-administered medications for this visit. (Other)  ? ?REVIEW OF SYSTEMS: ?ROS   ?Positive for: Genitourinary, Cardiovascular, Eyes, Respiratory ?Negative for: Constitutional, Gastrointestinal, Neurological, Skin, Musculoskeletal, HENT, Endocrine, Psychiatric, Allergic/Imm, Heme/Lymph ?Last edited by Annie Paras, COT on 08/19/2021  8:49 AM.  ?  ? ?ALLERGIES ?Allergies  ?Allergen Reactions  ? Colchicine Other (See Comments)  ?  Other reaction(s): foot  pain  ? Indapamide Other (See Comments)  ?  unknown  ? Lipitor [Atorvastatin] Itching  ?  Leg pain  ? Viagra [Sildenafil] Other (See Comments)  ?  Feels weird - Levitra. - headache  ? Zyloprim [Allopurinol] Other (See Comments)  ?  foot pain  ? ?PAST MEDICAL HISTORY ?Past Medical History:  ?Diagnosis Date  ? Atrial arrhythmia   ? atrial tachycardia with variable AV conduction versus atypical aflutter 01/10/19, rate control (02/06/19)  ? Cataract   ? Dyspnea   ? on exertion  ? ESRD (end stage renal disease) ([place])   ? Hemo-  MWF, Polycystic kidney disease  ? Fatigue   ? History of kidney stones   ? removal of stone- cysto  ? Hyperlipidemia   ? Hyperparathyroidism, secondary renal ([place])   ? Hypertension   ? Hypoxemia 12/12/2013  ? Nonischemic cardiomyopathy ([place])   ? Er 25% 2015, 55 % 2013  ? OSA on CPAP   ? no longer using cpap  ? OSA on CPAP 03/24/2014  ? Pneumonia   ? 2015ish  ? Prostate cancer ([place])   ? Ventricular tachycardia//Freq PVCs   ? Wears glasses   ? ?Past Surgical History:  ?Procedure Laterality Date  ? A-FLUTTER ABLATION N/A 04/11/2019  ? Procedure: A-FLUTTER ABLATION;  Surgeon: Evans Lance, MD;  Location: [facility];  Service: Cardiovascular;  Laterality: N/A;  ? A/V FISTULAGRAM Left 04/27/2017  ? Procedure: A/V FISTULAGRAM;  Surgeon: Conrad [place], MD;  Location: [facility];  Service: Cardiovascular;  Laterality: Left;  lt arm  ? A/V FISTULAGRAM Left 01/10/2019  ? Procedure: A/V FISTULAGRAM;  Surgeon: Marty Heck, MD;  Location: [facility];  Service: Cardiovascular;  Laterality: Left;  ? APPENDECTOMY    ? AV FISTULA PLACEMENT  12/05/2011  ? Procedure: ARTERIOVENOUS (AV) FISTULA CREATION;LLEFT ARM  Surgeon: Conrad [place], MD;  Location: [facility];  Service: Vascular;  Laterality: Left;  RADIO-CEPHALIC  fistula left arm  ? AV FISTULA PLACEMENT  01/11/2012  ? Procedure: ARTERIOVENOUS (AV) FISTULA CREATION;  Surgeon: Conrad [place], MD;  Location: [facility];  Service: Vascular;  Laterality: Left;  Creation of left brachial cephalic arteriovenous fistula  ? AV FISTULA PLACEMENT Right 03/07/2019  ? Procedure: ARTERIOVENOUS (AV) FISTULA CREATION  RIGHT ARM;  Surgeon: Marty Heck, MD;  Location: [facility];  Service: Vascular;  Laterality: Right;  ? BASCILIC VEIN TRANSPOSITION Left 12/27/2016  ? Procedure: BASILIC VEIN TRANSPOSITION LEFT UPPER ARM FIRST STAGE;  Surgeon: Conrad [place], MD;  Location: [facility];  Service: Vascular;  Laterality: Left;  ? BASCILIC VEIN TRANSPOSITION Left 01/31/2017  ? Procedure:  LEFT ARM BASILIC VEIN TRANSPOSITION, SECOND STAGE;  Surgeon: Conrad Argos, MD;  Location: [facility];  Service: Vascular;  Laterality: Left;  ? CARDIAC CATHETERIZATION  04-05-2010  ? checking for blockage but none-[facility]  ? COLONOSCOPY    ? CYSTOSCOPY W/ STONE MANIPULATION    ? "laser once" (01/22/2013)  ? HEMATOMA EVACUATION Left 05/09/2017  ? Procedure: EVACUATION HEMATOMA LEFT ARM;  Surgeon: Conrad , MD;  Location: [facility];  Service: Vascular;  Laterality: Left;  ? HERNIA REPAIR    ? INGUINAL HERNIA REPAIR Right 11/06/2015  ? Procedure: OPEN HERNIA REPAIR  RIGHT INGUINAL ADULT;  Surgeon: [name], MD;  [facility];  Service: General;  Laterality: Right;  with MESH  ? INSERTION OF DIALYSIS CATHETER Right 10/05/2016  ? Procedure: INSERTION OF right internal jugular DIALYSIS CATHETER;  Surgeon: [name], MD;  Location: [facility];  Service: Vascular;  Laterality: Right;  ? INSERTION OF MESH  03/20/2012  ? Procedure: INSERTION OF MESH;  UMB Surgeon: [name], MD;  Location: [facility];  Service: General;  Laterality: N/A;  ? INSERTION OF MESH N/A 01/22/2013  ? Procedure: INSERTION OF MESH;  Surgeon: [name], MD;  Location: [facility];  Service: General;  Laterality: N/A;  ? LAPAROTOMY  04/02/2012  ? Procedure: EXPLORATORY LAPAROTOMY;  Surgeon: [name], MD;  Location: [facility];  Service: General;  Laterality: N/A;  Exploratory Laparotomy with resection of small intestine  ? LEFT HEART CATHETERIZATION WITH CORONARY ANGIOGRAM N/A 05/14/2013  ? Procedure: LEFT HEART CATHETERIZATION WITH CORONARY ANGIOGRAM;  Surgeon: Sinclair Grooms, MD;  Location: [facility];  Service: Cardiovascular;  Laterality: N/A;  ? LIGATION OF ARTERIOVENOUS  FISTULA Left 12/27/2016  ? Procedure: LIGATION/EXCISION OF LEFT UPPER ARM ARTERIOVENOUS  FISTULA;  EVACUATION OF HEMATOMA;  Surgeon: Conrad [place], MD;  Location: [facility];  Service: Vascular;  Laterality: Left;  ? LIGATION OF ARTERIOVENOUS  FISTULA Left 03/07/2019  ? Procedure:  LIGATION OF ARTERIOVENOUS FISTULA  LEFT UPPER ARM;  Surgeon: Marty Heck, MD;  Location: [facility];  Service: Vascular;  Laterality: Left;  ? REVISON OF ARTERIOVENOUS FISTULA Left 10/05/2016  ? Procedure: REVI

## 2021-08-21 DIAGNOSIS — N2581 Secondary hyperparathyroidism of renal origin: Secondary | ICD-10-CM | POA: Diagnosis not present

## 2021-08-21 DIAGNOSIS — D631 Anemia in chronic kidney disease: Secondary | ICD-10-CM | POA: Diagnosis not present

## 2021-08-21 DIAGNOSIS — N186 End stage renal disease: Secondary | ICD-10-CM | POA: Diagnosis not present

## 2021-08-21 DIAGNOSIS — Z992 Dependence on renal dialysis: Secondary | ICD-10-CM | POA: Diagnosis not present

## 2021-08-23 NOTE — Progress Notes (Signed)
?[facility] Note ? ?08/26/2021 ?  ? ?CHIEF COMPLAINT ?Patient presents for Retina Follow Up ? ? ?HISTORY OF PRESENT ILLNESS: ?Michael [name]. is a 66 y.o. male who presents to the clinic today for:  ? ?HPI   ? ? Retina Follow Up   ?Patient presents with  CRVO/BRVO.  In left eye.  This started 1 week ago.  I, the attending physician,  performed the HPI with the patient and updated documentation appropriately. ? ?  ?  ? ? Comments   ?Patient here for 1 weeks retina follow up for  IOP/K check OS. Patient states vision about the same. No eye pain.  ? ?  ?  ?Last edited by [name], MD on 08/26/2021 11:56 AM.  ?  ? ?Pt states he can see "a little bit" out of his left eye today ? ?Referring physician: ?[name], MD ?[address] ?[address] ?[place],  [place] [address] ? ?HISTORICAL INFORMATION:  ? ?Selected notes from the [facility] ?Referred by Dr. [name]. Hecker for concern of HRVO OD; ?LEE- 11.30.18 ([name]) {BCVA OD: 20/100-1 OS: 20/20-2] ?Ocular Hx- cataract OU ?PMH- HTN, high chol, kidney disease, sleep apnea, emphysema ?  ? ?CURRENT MEDICATIONS: ?Current Outpatient Medications (Ophthalmic Drugs)  ?Medication Sig  ? atropine 1 % ophthalmic solution Place 1 drop into the left eye in the morning and at bedtime.  ? brimonidine (ALPHAGAN) 0.2 % ophthalmic solution Place 1 drop into the left eye 3 (three) times daily.  ? dorzolamide-timolol (COSOPT) 22.3-6.8 MG/ML ophthalmic solution Place 1 drop into the left eye 3 (three) times daily.  ? Loteprednol Etabonate (LOTEMAX SM) 0.38 % GEL Place 1 drop into the left eye every 2 (two) hours.  ? sodium chloride (MURO 128) 2 % ophthalmic solution Place 1 drop into the left eye 4 (four) times daily.  ? ?No current facility-administered medications for this visit. (Ophthalmic Drugs)  ? ?Current Outpatient Medications (Other)  ?Medication Sig  ? apixaban (ELIQUIS) 5 MG TABS tablet Take 1 tablet by mouth twice daily (Patient taking  differently: Take 5 mg by mouth daily.)  ? cinacalcet (SENSIPAR) 30 MG tablet Take 30 mg by mouth daily.  ? clotrimazole-betamethasone (LOTRISONE) cream Apply 1 application topically 2 (two) times daily.  ? losartan (COZAAR) 25 MG tablet Take 0.5 tablets (12.5 mg total) by mouth daily. (Patient taking differently: Take 25 mg by mouth daily.)  ? metoprolol succinate (TOPROL XL) 25 MG 24 hr tablet Take 1 tablet (25 mg total) by mouth at bedtime.  ? nitroGLYCERIN (NITROSTAT) 0.4 MG SL tablet Place 1 tablet (0.4 mg total) under the tongue every 5 (five) minutes x 3 doses as needed for chest pain.  ? rosuvastatin (CRESTOR) 10 MG tablet Take 1 tablet (10 mg total) by mouth daily.  ? sevelamer carbonate (RENVELA) 800 MG tablet Take 1,600 mg by mouth 3 (three) times daily with meals.  ? ?No current facility-administered medications for this visit. (Other)  ? ?REVIEW OF SYSTEMS: ?ROS   ?Positive for: Genitourinary, Cardiovascular, Eyes, Respiratory ?Negative for: Constitutional, Gastrointestinal, Neurological, Skin, Musculoskeletal, HENT, Endocrine, Psychiatric, Allergic/Imm, Heme/Lymph ?Last edited by Theodore Demark, COA on 08/26/2021  8:39 AM.  ?  ? ? ?ALLERGIES ?Allergies  ?Allergen Reactions  ? Colchicine Other (See Comments)  ?  Other reaction(s): foot pain  ? Indapamide Other (See Comments)  ?  unknown  ? Lipitor [Atorvastatin] Itching  ?  Leg pain  ? Viagra [Sildenafil]  Other (See Comments)  ?  Feels weird - Levitra. - headache  ? Zyloprim [Allopurinol] Other (See Comments)  ?  foot pain  ? ?PAST MEDICAL HISTORY ?Past Medical History:  ?Diagnosis Date  ? Atrial arrhythmia   ? atrial tachycardia with variable AV conduction versus atypical aflutter 01/10/19, rate control (02/06/19)  ? Cataract   ? Dyspnea   ? on exertion  ? ESRD (end stage renal disease) ([place])   ? Hemo- MWF, Polycystic kidney disease  ? Fatigue   ? History of kidney stones   ? removal of stone- cysto  ? Hyperlipidemia   ? Hyperparathyroidism, secondary  renal ([place])   ? Hypertension   ? Hypoxemia 12/12/2013  ? Nonischemic cardiomyopathy ([place])   ? Er 25% 2015, 55 % 2013  ? OSA on CPAP   ? no longer using cpap  ? OSA on CPAP 03/24/2014  ? Pneumonia   ? 2015ish  ? Prostate cancer ([place])   ? Ventricular tachycardia//Freq PVCs   ? Wears glasses   ? ?Past Surgical History:  ?Procedure Laterality Date  ? A-FLUTTER ABLATION N/A 04/11/2019  ? Procedure: A-FLUTTER ABLATION;  Surgeon: Evans Lance, MD;  Location: [facility];  Service: Cardiovascular;  Laterality: N/A;  ? A/V FISTULAGRAM Left 04/27/2017  ? Procedure: A/V FISTULAGRAM;  Surgeon: Conrad Jansen, MD;  Location: [facility];  Service: Cardiovascular;  Laterality: Left;  lt arm  ? A/V FISTULAGRAM Left 01/10/2019  ? Procedure: A/V FISTULAGRAM;  Surgeon: Marty Heck, MD;  Location: [facility];  Service: Cardiovascular;  Laterality: Left;  ? APPENDECTOMY    ? AV FISTULA PLACEMENT  12/05/2011  ? Procedure: ARTERIOVENOUS (AV) FISTULA CREATION;LLEFT ARM  Surgeon: Conrad North Warren, MD;  Location: [facility];  Service: Vascular;  Laterality: Left;  RADIO-CEPHALIC  fistula left arm  ? AV FISTULA PLACEMENT  01/11/2012  ? Procedure: ARTERIOVENOUS (AV) FISTULA CREATION;  Surgeon: Conrad Woodbury, MD;  Location: [facility];  Service: Vascular;  Laterality: Left;  Creation of left brachial cephalic arteriovenous fistula  ? AV FISTULA PLACEMENT Right 03/07/2019  ? Procedure: ARTERIOVENOUS (AV) FISTULA CREATION  RIGHT ARM;  Surgeon: Marty Heck, MD;  Location: [facility];  Service: Vascular;  Laterality: Right;  ? BASCILIC VEIN TRANSPOSITION Left 12/27/2016  ? Procedure: BASILIC VEIN TRANSPOSITION LEFT UPPER ARM FIRST STAGE;  Surgeon: Conrad Gustavus, MD;  Location: [facility];  Service: Vascular;  Laterality: Left;  ? BASCILIC VEIN TRANSPOSITION Left 01/31/2017  ? Procedure: LEFT ARM BASILIC VEIN TRANSPOSITION, SECOND STAGE;  Surgeon: Conrad Edon, MD;  Location: [facility];  Service: Vascular;  Laterality: Left;  ? CARDIAC  CATHETERIZATION  04-05-2010  ? checking for blockage but none-[facility]  ? COLONOSCOPY    ? CYSTOSCOPY W/ STONE MANIPULATION    ? "laser once" (01/22/2013)  ? HEMATOMA EVACUATION Left 05/09/2017  ? Procedure: EVACUATION HEMATOMA LEFT ARM;  Surgeon: Conrad [name], MD;  Location: [facility];  Service: Vascular;  Laterality: Left;  ? HERNIA REPAIR    ? INGUINAL HERNIA REPAIR Right 11/06/2015  ? Procedure: OPEN HERNIA REPAIR  RIGHT INGUINAL ADULT;  Surgeon: [name], MD;  [facility];  Service: General;  Laterality: Right;  with MESH  ? INSERTION OF DIALYSIS CATHETER Right 10/05/2016  ? Procedure: INSERTION OF right internal jugular DIALYSIS CATHETER;  Surgeon: [name], MD;  Location: [facility];  Service: Vascular;  Laterality: Right;  ? INSERTION OF MESH  03/20/2012  ?  Procedure: INSERTION OF MESH;  UMB Surgeon: [name], MD;  Location: [facility];  Service: General;  Laterality: N/A;  ? INSERTION OF MESH N/A 01/22/2013  ? Procedure: INSERTION OF MESH;  Surgeon: [name], MD;  Location: [facility];  Service: General;  Laterality: N/A;  ? LAPAROTOMY  04/02/2012  ? Procedure: EXPLORATORY LAPAROTOMY;  Surgeon: [name], MD;  Location: [facility];  Service: General;  Laterality: N/A;  Exploratory Laparotomy with resection of small intestine  ? LEFT HEART CATHETERIZATION WITH CORONARY ANGIOGRAM N/A 05/14/2013  ? Procedure: LEFT HEART CATHETERIZATION WITH CORONARY ANGIOGRAM;  Surgeon: Sinclair Grooms, MD;  Location: [facility] C[facility] LAB;  Service: Cardiovascular;  Laterality: N/A;  ? LIGATION OF ARTERIOVENOUS  FISTULA Left 12/27/2016  ? Procedure: LIGATION/EXCISION OF LEFT UPPER ARM ARTERIOVENOUS  FISTULA;  EVACUATION OF HEMATOMA;  Surgeon: Conrad Hazlehurst, MD;  Location: [facility];  Service: Vascular;  Laterality: Left;  ? LIGATION OF ARTERIOVENOUS  FISTULA Left 03/07/2019  ? Procedure: LIGATION OF ARTERIOVENOUS FISTULA  LEFT UPPER ARM;  Surgeon: Marty Heck, MD;  Location: [facility];  Service: Vascular;  Laterality: Left;  ?  REVISON OF ARTERIOVENOUS FISTULA Left 10/05/2016  ? Procedure: REVISON OF left arm ARTERIOVENOUS FISTULA;  Surgeon: [name], MD;  Location: [facility];  Service: Vascular;  Laterality: Left;  ? TONSILLECTOMY

## 2021-08-24 DIAGNOSIS — N186 End stage renal disease: Secondary | ICD-10-CM | POA: Diagnosis not present

## 2021-08-24 DIAGNOSIS — Z992 Dependence on renal dialysis: Secondary | ICD-10-CM | POA: Diagnosis not present

## 2021-08-24 DIAGNOSIS — D631 Anemia in chronic kidney disease: Secondary | ICD-10-CM | POA: Diagnosis not present

## 2021-08-24 DIAGNOSIS — N2581 Secondary hyperparathyroidism of renal origin: Secondary | ICD-10-CM | POA: Diagnosis not present

## 2021-08-25 ENCOUNTER — Other Ambulatory Visit: Payer: Self-pay

## 2021-08-25 ENCOUNTER — Inpatient Hospital Stay (HOSPITAL_BASED_OUTPATIENT_CLINIC_OR_DEPARTMENT_OTHER): Payer: Medicare Other | Admitting: Oncology

## 2021-08-25 ENCOUNTER — Inpatient Hospital Stay: Payer: Medicare Other | Attending: Family Medicine

## 2021-08-25 VITALS — BP 101/61 | HR 74 | Temp 97.9°F | Resp 16 | Ht 67.0 in | Wt 192.3 lb

## 2021-08-25 DIAGNOSIS — C61 Malignant neoplasm of prostate: Secondary | ICD-10-CM

## 2021-08-25 DIAGNOSIS — Z79899 Other long term (current) drug therapy: Secondary | ICD-10-CM | POA: Insufficient documentation

## 2021-08-25 DIAGNOSIS — Z8546 Personal history of malignant neoplasm of prostate: Secondary | ICD-10-CM | POA: Diagnosis not present

## 2021-08-25 DIAGNOSIS — D751 Secondary polycythemia: Secondary | ICD-10-CM | POA: Insufficient documentation

## 2021-08-25 DIAGNOSIS — D45 Polycythemia vera: Secondary | ICD-10-CM

## 2021-08-25 DIAGNOSIS — D696 Thrombocytopenia, unspecified: Secondary | ICD-10-CM | POA: Insufficient documentation

## 2021-08-25 LAB — CBC WITH DIFFERENTIAL (CANCER CENTER ONLY)
Abs Immature Granulocytes: 0.01 10*3/uL (ref 0.00–0.07)
Basophils Absolute: 0 10*3/uL (ref 0.0–0.1)
Basophils Relative: 1 %
Eosinophils Absolute: 0.1 10*3/uL (ref 0.0–0.5)
Eosinophils Relative: 3 %
HCT: 36.9 % — ABNORMAL LOW (ref 39.0–52.0)
Hemoglobin: 11.3 g/dL — ABNORMAL LOW (ref 13.0–17.0)
Immature Granulocytes: 0 %
Lymphocytes Relative: 6 %
Lymphs Abs: 0.2 10*3/uL — ABNORMAL LOW (ref 0.7–4.0)
MCH: 27.5 pg (ref 26.0–34.0)
MCHC: 30.6 g/dL (ref 30.0–36.0)
MCV: 89.8 fL (ref 80.0–100.0)
Monocytes Absolute: 0.4 10*3/uL (ref 0.1–1.0)
Monocytes Relative: 14 %
Neutro Abs: 2.3 10*3/uL (ref 1.7–7.7)
Neutrophils Relative %: 76 %
Platelet Count: 115 10*3/uL — ABNORMAL LOW (ref 150–400)
RBC: 4.11 MIL/uL — ABNORMAL LOW (ref 4.22–5.81)
RDW: 15.4 % (ref 11.5–15.5)
WBC Count: 3 10*3/uL — ABNORMAL LOW (ref 4.0–10.5)
nRBC: 0 % (ref 0.0–0.2)

## 2021-08-25 NOTE — Progress Notes (Signed)
Hematology and Oncology Follow Up Visit ? ?Michael [name]. ?[id number] ?[birth year] 66 y.o. ?08/25/2021 11:53 AM ?Michael Panning, MD  ? ?Principle Diagnosis: 66 year old with polycythemia noted in April 2022.  His hemoglobin ranged between 16 and 17.  His work-up did not show a myeloproliferative disorder. ? ? ? ? ?Current therapy: Active surveillance. ? ?Interim History: Michael [name] returns today for a follow-up visit.  Since last visit, he was hospitalized in early part of April 2023 due to altered mental status and hypoglycemia.  His episodes have resolved and he was discharged on August 16, 2021.  Since his discharge, he reports feeling well without any complaints at this time.  He denies any nausea, vomiting or abdominal pain.  He denies any thrombosis or bleeding complications.  His performance status quality of life remains unchanged. ? ? ? ? ?Medications: I have reviewed the patient's current medications.  ?Current Outpatient Medications  ?Medication Sig Dispense Refill  ? apixaban (ELIQUIS) 5 MG TABS tablet Take 1 tablet by mouth twice daily (Patient taking differently: Take 5 mg by mouth daily.) 180 tablet 1  ? atropine 1 % ophthalmic solution Place 1 drop into the left eye in the morning and at bedtime.    ? brimonidine (ALPHAGAN) 0.2 % ophthalmic solution Place 1 drop into the left eye 3 (three) times daily. 10 mL 2  ? cinacalcet (SENSIPAR) 30 MG tablet Take 30 mg by mouth daily.    ? clotrimazole-betamethasone (LOTRISONE) cream Apply 1 application topically 2 (two) times daily. 45 g 2  ? dorzolamide-timolol (COSOPT) 22.3-6.8 MG/ML ophthalmic solution Place 1 drop into the left eye 3 (three) times daily. 10 mL 2  ? losartan (COZAAR) 25 MG tablet Take 0.5 tablets (12.5 mg total) by mouth daily. (Patient taking differently: Take 25 mg by mouth daily.) 45 tablet 3  ? Loteprednol Etabonate (LOTEMAX SM) 0.38 % GEL Place 1 drop into the left eye every 2 (two) hours. 5 g 3  ? metoprolol succinate  (TOPROL XL) 25 MG 24 hr tablet Take 1 tablet (25 mg total) by mouth at bedtime. 90 tablet 3  ? nitroGLYCERIN (NITROSTAT) 0.4 MG SL tablet Place 1 tablet (0.4 mg total) under the tongue every 5 (five) minutes x 3 doses as needed for chest pain. 30 tablet 12  ? rosuvastatin (CRESTOR) 10 MG tablet Take 1 tablet (10 mg total) by mouth daily. 90 tablet 3  ? sevelamer carbonate (RENVELA) 800 MG tablet Take 1,600 mg by mouth 3 (three) times daily with meals.    ? sodium chloride (MURO 128) 2 % ophthalmic solution Place 1 drop into the left eye 4 (four) times daily. 15 mL 2  ? ?No current facility-administered medications for this visit.  ? ? ? ?Allergies:  ?Allergies  ?Allergen Reactions  ? Colchicine Other (See Comments)  ?  Other reaction(s): foot pain  ? Indapamide Other (See Comments)  ?  unknown  ? Lipitor [Atorvastatin] Itching  ?  Leg pain  ? Viagra [Sildenafil] Other (See Comments)  ?  Feels weird - Levitra. - headache  ? Zyloprim [Allopurinol] Other (See Comments)  ?  foot pain  ? ? ?Past Medical History, Surgical history, Social history, and Family History were reviewed and updated. ? ? ?Physical Exam: ?Blood pressure 101/61, pulse 74, temperature 97.9 ?F (36.6 ?C), resp. rate 16, height '5\' 7"'$  (1.702 m), weight 192 lb 4.8 oz (87.2 kg), SpO2 98 %. ?ECOG: 1 ?General appearance: alert and cooperative appeared without distress. ?Head: Normocephalic,  without obvious abnormality ?Oropharynx: No oral thrush or ulcers. ?Eyes: No scleral icterus.  Pupils are equal and round reactive to light. ?Lymph nodes: Cervical, supraclavicular, and axillary nodes normal. ?Heart:regular rate and rhythm, S1, S2 normal, no murmur, click, rub or gallop ?Lung:chest clear, no wheezing, rales, normal symmetric air entry ?Abdomin: soft, non-tender, without masses or organomegaly. ?Neurological: No motor, sensory deficits.  Intact deep tendon reflexes. ?Skin: No rashes or lesions.  No ecchymosis or petechiae. ?Musculoskeletal: No joint  deformity or effusion. ?Psychiatric: Mood and affect are appropriate. ? ? ? ?Lab Results: ?Lab Results  ?Component Value Date  ? WBC 3.0 (L) 08/25/2021  ? HGB 11.3 (L) 08/25/2021  ? HCT 36.9 (L) 08/25/2021  ? MCV 89.8 08/25/2021  ? PLT 115 (L) 08/25/2021  ? ?  Chemistry   ?   ?Component Value Date/Time  ? NA 130 (L) 08/16/2021 0715  ? NA 140 04/22/2021 1435  ? K 4.2 08/16/2021 0715  ? CL 94 (L) 08/16/2021 0715  ? CO2 25 08/16/2021 0715  ? BUN 22 08/16/2021 0715  ? BUN 23 04/22/2021 1435  ? CREATININE 10.10 (H) 08/16/2021 0715  ?    ?Component Value Date/Time  ? CALCIUM 8.7 (L) 08/16/2021 0715  ? ALKPHOS 24 (L) 08/14/2021 0756  ? AST 13 (L) 08/14/2021 0756  ? ALT 6 08/14/2021 0756  ? BILITOT 1.2 08/14/2021 0756  ?  ? ? ? ? ? ?Impression and Plan: ? ?66 year old with: ? ?1.  Secondary polycythemia diagnosed in 2022.  His work-up did not reveal a myeloproliferative disorder including polycythemia vera.  Molecular testing did not show any genetic mutation to suggest that. ?  ?Laboratory data from today showed a hemoglobin of 11.3 which had fluctuated between 11 and 12 in the last few months.  No additional testing or work-up is needed at this time. ?  ?2.  Thrombocytopenia: Appears to be fluctuating and chronically mild in nature.  I do not anticipate that this is part of a hematological disorder. ?  ?3. T1c Gleason score 7 prostate cancer diagnosed in 2022.  He received definitive therapy completed in March 2022.  He is following with Dr. [name] without any relapsed disease.  We will check his PSA today based on his request. ? ?4.  Follow-up: Happy to see her in the future as needed. ?  ?30  minutes were spent on this encounter.  The time was dedicated to reviewing laboratory data, disease status update and future plan of care discussion. ?  ? ? ?Michael Button, MD ?4/26/202311:53 AM ? ?

## 2021-08-26 ENCOUNTER — Telehealth: Payer: Self-pay | Admitting: *Deleted

## 2021-08-26 ENCOUNTER — Ambulatory Visit (INDEPENDENT_AMBULATORY_CARE_PROVIDER_SITE_OTHER): Payer: Medicare Other | Admitting: Ophthalmology

## 2021-08-26 ENCOUNTER — Encounter (INDEPENDENT_AMBULATORY_CARE_PROVIDER_SITE_OTHER): Payer: Self-pay | Admitting: Ophthalmology

## 2021-08-26 DIAGNOSIS — I10 Essential (primary) hypertension: Secondary | ICD-10-CM | POA: Diagnosis not present

## 2021-08-26 DIAGNOSIS — H1820 Unspecified corneal edema: Secondary | ICD-10-CM | POA: Diagnosis not present

## 2021-08-26 DIAGNOSIS — Z992 Dependence on renal dialysis: Secondary | ICD-10-CM | POA: Diagnosis not present

## 2021-08-26 DIAGNOSIS — H35033 Hypertensive retinopathy, bilateral: Secondary | ICD-10-CM

## 2021-08-26 DIAGNOSIS — H35412 Lattice degeneration of retina, left eye: Secondary | ICD-10-CM | POA: Diagnosis not present

## 2021-08-26 DIAGNOSIS — H43813 Vitreous degeneration, bilateral: Secondary | ICD-10-CM

## 2021-08-26 DIAGNOSIS — H209 Unspecified iridocyclitis: Secondary | ICD-10-CM

## 2021-08-26 DIAGNOSIS — H34831 Tributary (branch) retinal vein occlusion, right eye, with macular edema: Secondary | ICD-10-CM | POA: Diagnosis not present

## 2021-08-26 DIAGNOSIS — B0052 Herpesviral keratitis: Secondary | ICD-10-CM

## 2021-08-26 DIAGNOSIS — H25813 Combined forms of age-related cataract, bilateral: Secondary | ICD-10-CM

## 2021-08-26 DIAGNOSIS — H4052X2 Glaucoma secondary to other eye disorders, left eye, moderate stage: Secondary | ICD-10-CM | POA: Diagnosis not present

## 2021-08-26 DIAGNOSIS — D631 Anemia in chronic kidney disease: Secondary | ICD-10-CM | POA: Diagnosis not present

## 2021-08-26 DIAGNOSIS — H34812 Central retinal vein occlusion, left eye, with macular edema: Secondary | ICD-10-CM | POA: Diagnosis not present

## 2021-08-26 DIAGNOSIS — N2581 Secondary hyperparathyroidism of renal origin: Secondary | ICD-10-CM | POA: Diagnosis not present

## 2021-08-26 DIAGNOSIS — N186 End stage renal disease: Secondary | ICD-10-CM | POA: Diagnosis not present

## 2021-08-26 DIAGNOSIS — H33332 Multiple defects of retina without detachment, left eye: Secondary | ICD-10-CM | POA: Diagnosis not present

## 2021-08-26 LAB — PROSTATE-SPECIFIC AG, SERUM (LABCORP): Prostate Specific Ag, Serum: 0.3 ng/mL (ref 0.0–4.0)

## 2021-08-26 NOTE — Telephone Encounter (Signed)
Notified of message below

## 2021-08-26 NOTE — Telephone Encounter (Signed)
-----   Message from Wyatt Portela, MD sent at 08/26/2021  8:53 AM EDT ----- ?Please let him know his PSA is low ?

## 2021-08-27 ENCOUNTER — Ambulatory Visit (INDEPENDENT_AMBULATORY_CARE_PROVIDER_SITE_OTHER): Payer: Medicare Other | Admitting: Ophthalmology

## 2021-08-27 ENCOUNTER — Encounter (INDEPENDENT_AMBULATORY_CARE_PROVIDER_SITE_OTHER): Payer: Self-pay | Admitting: Ophthalmology

## 2021-08-27 DIAGNOSIS — H35412 Lattice degeneration of retina, left eye: Secondary | ICD-10-CM

## 2021-08-27 DIAGNOSIS — I10 Essential (primary) hypertension: Secondary | ICD-10-CM

## 2021-08-27 DIAGNOSIS — H35033 Hypertensive retinopathy, bilateral: Secondary | ICD-10-CM

## 2021-08-27 DIAGNOSIS — H34812 Central retinal vein occlusion, left eye, with macular edema: Secondary | ICD-10-CM | POA: Diagnosis not present

## 2021-08-27 DIAGNOSIS — H33332 Multiple defects of retina without detachment, left eye: Secondary | ICD-10-CM

## 2021-08-27 DIAGNOSIS — H209 Unspecified iridocyclitis: Secondary | ICD-10-CM

## 2021-08-27 DIAGNOSIS — H1820 Unspecified corneal edema: Secondary | ICD-10-CM

## 2021-08-27 DIAGNOSIS — H25813 Combined forms of age-related cataract, bilateral: Secondary | ICD-10-CM

## 2021-08-27 DIAGNOSIS — H4052X2 Glaucoma secondary to other eye disorders, left eye, moderate stage: Secondary | ICD-10-CM

## 2021-08-27 DIAGNOSIS — B0052 Herpesviral keratitis: Secondary | ICD-10-CM

## 2021-08-27 DIAGNOSIS — H34831 Tributary (branch) retinal vein occlusion, right eye, with macular edema: Secondary | ICD-10-CM

## 2021-08-27 DIAGNOSIS — H43813 Vitreous degeneration, bilateral: Secondary | ICD-10-CM

## 2021-08-27 NOTE — Progress Notes (Signed)
?[facility] Note ? ?08/27/2021 ?  ? ?CHIEF COMPLAINT ?Patient presents for Retina Follow Up ? ? ?HISTORY OF PRESENT ILLNESS: ?Michael [name]. is a 66 y.o. male who presents to the clinic today for:  ? ?HPI   ? ? Retina Follow Up   ?Patient presents with  CRVO/BRVO.  In left eye.  This started months ago.  Duration of 1 day.  I, the attending physician,  performed the HPI with the patient and updated documentation appropriately. ? ?  ?  ? ? Comments   ?Patient states that he is here for the injection in the left eye today.  ? ?  ?  ?Last edited by Annie Paras, COT on 08/27/2021  9:02 AM.  ?  ? ?Pt states he can see "a little bit" out of his left eye today ? ?Referring physician: ?[name], MD ?[address] ?[address] ?[place],  [place] [address] ? ?HISTORICAL INFORMATION:  ? ?Selected notes from the [facility] ?Referred by Dr. [name]. Hecker for concern of HRVO OD; ?LEE- 11.30.18 ([name]) {BCVA OD: 20/100-1 OS: 20/20-2] ?Ocular Hx- cataract OU ?PMH- HTN, high chol, kidney disease, sleep apnea, emphysema ?  ? ?CURRENT MEDICATIONS: ?Current Outpatient Medications (Ophthalmic Drugs)  ?Medication Sig  ? atropine 1 % ophthalmic solution Place 1 drop into the left eye in the morning and at bedtime.  ? brimonidine (ALPHAGAN) 0.2 % ophthalmic solution Place 1 drop into the left eye 3 (three) times daily.  ? dorzolamide-timolol (COSOPT) 22.3-6.8 MG/ML ophthalmic solution Place 1 drop into the left eye 3 (three) times daily.  ? Loteprednol Etabonate (LOTEMAX SM) 0.38 % GEL Place 1 drop into the left eye every 2 (two) hours.  ? sodium chloride (MURO 128) 2 % ophthalmic solution Place 1 drop into the left eye 4 (four) times daily.  ? ?No current facility-administered medications for this visit. (Ophthalmic Drugs)  ? ?Current Outpatient Medications (Other)  ?Medication Sig  ? apixaban (ELIQUIS) 5 MG TABS tablet Take 1 tablet by mouth twice daily (Patient taking differently:  Take 5 mg by mouth daily.)  ? cinacalcet (SENSIPAR) 30 MG tablet Take 30 mg by mouth daily.  ? clotrimazole-betamethasone (LOTRISONE) cream Apply 1 application topically 2 (two) times daily.  ? losartan (COZAAR) 25 MG tablet Take 0.5 tablets (12.5 mg total) by mouth daily. (Patient taking differently: Take 25 mg by mouth daily.)  ? metoprolol succinate (TOPROL XL) 25 MG 24 hr tablet Take 1 tablet (25 mg total) by mouth at bedtime.  ? nitroGLYCERIN (NITROSTAT) 0.4 MG SL tablet Place 1 tablet (0.4 mg total) under the tongue every 5 (five) minutes x 3 doses as needed for chest pain.  ? rosuvastatin (CRESTOR) 10 MG tablet Take 1 tablet (10 mg total) by mouth daily.  ? sevelamer carbonate (RENVELA) 800 MG tablet Take 1,600 mg by mouth 3 (three) times daily with meals.  ? ?No current facility-administered medications for this visit. (Other)  ? ?REVIEW OF SYSTEMS: ?ROS   ?Positive for: Genitourinary, Cardiovascular, Eyes, Respiratory ?Negative for: Constitutional, Gastrointestinal, Neurological, Skin, Musculoskeletal, HENT, Endocrine, Psychiatric, Allergic/Imm, Heme/Lymph ?Last edited by Annie Paras, COT on 08/27/2021  9:02 AM.  ?  ? ? ?ALLERGIES ?Allergies  ?Allergen Reactions  ? Colchicine Other (See Comments)  ?  Other reaction(s): foot pain  ? Indapamide Other (See Comments)  ?  unknown  ? Lipitor [Atorvastatin] Itching  ?  Leg pain  ? Viagra [Sildenafil] Other (See  Comments)  ?  Feels weird - Levitra. - headache  ? Zyloprim [Allopurinol] Other (See Comments)  ?  foot pain  ? ?PAST MEDICAL HISTORY ?Past Medical History:  ?Diagnosis Date  ? Atrial arrhythmia   ? atrial tachycardia with variable AV conduction versus atypical aflutter 01/10/19, rate control (02/06/19)  ? Cataract   ? Dyspnea   ? on exertion  ? ESRD (end stage renal disease) ([place])   ? Hemo- MWF, Polycystic kidney disease  ? Fatigue   ? History of kidney stones   ? removal of stone- cysto  ? Hyperlipidemia   ? Hyperparathyroidism, secondary renal  ([place])   ? Hypertension   ? Hypoxemia 12/12/2013  ? Nonischemic cardiomyopathy ([place])   ? Er 25% 2015, 55 % 2013  ? OSA on CPAP   ? no longer using cpap  ? OSA on CPAP 03/24/2014  ? Pneumonia   ? 2015ish  ? Prostate cancer ([place])   ? Ventricular tachycardia//Freq PVCs   ? Wears glasses   ? ?Past Surgical History:  ?Procedure Laterality Date  ? A-FLUTTER ABLATION N/A 04/11/2019  ? Procedure: A-FLUTTER ABLATION;  Surgeon: Evans Lance, MD;  Location: [facility];  Service: Cardiovascular;  Laterality: N/A;  ? A/V FISTULAGRAM Left 04/27/2017  ? Procedure: A/V FISTULAGRAM;  Surgeon: Conrad Graham, MD;  Location: [facility];  Service: Cardiovascular;  Laterality: Left;  lt arm  ? A/V FISTULAGRAM Left 01/10/2019  ? Procedure: A/V FISTULAGRAM;  Surgeon: Marty Heck, MD;  Location: [facility];  Service: Cardiovascular;  Laterality: Left;  ? APPENDECTOMY    ? AV FISTULA PLACEMENT  12/05/2011  ? Procedure: ARTERIOVENOUS (AV) FISTULA CREATION;LLEFT ARM  Surgeon: Conrad Pomaria, MD;  Location: [facility];  Service: Vascular;  Laterality: Left;  RADIO-CEPHALIC  fistula left arm  ? AV FISTULA PLACEMENT  01/11/2012  ? Procedure: ARTERIOVENOUS (AV) FISTULA CREATION;  Surgeon: Conrad Bystrom, MD;  Location: [facility];  Service: Vascular;  Laterality: Left;  Creation of left brachial cephalic arteriovenous fistula  ? AV FISTULA PLACEMENT Right 03/07/2019  ? Procedure: ARTERIOVENOUS (AV) FISTULA CREATION  RIGHT ARM;  Surgeon: Marty Heck, MD;  Location: [facility];  Service: Vascular;  Laterality: Right;  ? BASCILIC VEIN TRANSPOSITION Left 12/27/2016  ? Procedure: BASILIC VEIN TRANSPOSITION LEFT UPPER ARM FIRST STAGE;  Surgeon: Conrad Catarina, MD;  Location: [facility];  Service: Vascular;  Laterality: Left;  ? BASCILIC VEIN TRANSPOSITION Left 01/31/2017  ? Procedure: LEFT ARM BASILIC VEIN TRANSPOSITION, SECOND STAGE;  Surgeon: Conrad Kaltag, MD;  Location: [facility];  Service: Vascular;  Laterality: Left;  ? CARDIAC  CATHETERIZATION  04-05-2010  ? checking for blockage but none-[facility]  ? COLONOSCOPY    ? CYSTOSCOPY W/ STONE MANIPULATION    ? "laser once" (01/22/2013)  ? HEMATOMA EVACUATION Left 05/09/2017  ? Procedure: EVACUATION HEMATOMA LEFT ARM;  Surgeon: Conrad Osceola, MD;  Location: [facility];  Service: Vascular;  Laterality: Left;  ? HERNIA REPAIR    ? INGUINAL HERNIA REPAIR Right 11/06/2015  ? Procedure: OPEN HERNIA REPAIR  RIGHT INGUINAL ADULT;  Surgeon: [name], MD;  [facility];  Service: General;  Laterality: Right;  with MESH  ? INSERTION OF DIALYSIS CATHETER Right 10/05/2016  ? Procedure: INSERTION OF right internal jugular DIALYSIS CATHETER;  Surgeon: [name], MD;  Location: [facility];  Service: Vascular;  Laterality: Right;  ? INSERTION OF MESH  03/20/2012  ? Procedure: INSERTION  OF MESH;  UMB Surgeon: [name], MD;  Location: [facility];  Service: General;  Laterality: N/A;  ? INSERTION OF MESH N/A 01/22/2013  ? Procedure: INSERTION OF MESH;  Surgeon: [name], MD;  Location: [facility];  Service: General;  Laterality: N/A;  ? LAPAROTOMY  04/02/2012  ? Procedure: EXPLORATORY LAPAROTOMY;  Surgeon: [name], MD;  Location: [facility];  Service: General;  Laterality: N/A;  Exploratory Laparotomy with resection of small intestine  ? LEFT HEART CATHETERIZATION WITH CORONARY ANGIOGRAM N/A 05/14/2013  ? Procedure: LEFT HEART CATHETERIZATION WITH CORONARY ANGIOGRAM;  Surgeon: Sinclair Grooms, MD;  Location: [facility] C[facility];  Service: Cardiovascular;  Laterality: N/A;  ? LIGATION OF ARTERIOVENOUS  FISTULA Left 12/27/2016  ? Procedure: LIGATION/EXCISION OF LEFT UPPER ARM ARTERIOVENOUS  FISTULA;  EVACUATION OF HEMATOMA;  Surgeon: Conrad [place], MD;  Location: [facility];  Service: Vascular;  Laterality: Left;  ? LIGATION OF ARTERIOVENOUS  FISTULA Left 03/07/2019  ? Procedure: LIGATION OF ARTERIOVENOUS FISTULA  LEFT UPPER ARM;  Surgeon: Marty Heck, MD;  Location: [facility];  Service: Vascular;  Laterality: Left;  ?  REVISON OF ARTERIOVENOUS FISTULA Left 10/05/2016  ? Procedure: REVISON OF left arm ARTERIOVENOUS FISTULA;  Surgeon: [name], MD;  Location[facility];  Service: Vascular;  Laterality: Left;  ? TONSILLECTOMY    ? UMBILI

## 2021-08-30 DIAGNOSIS — D631 Anemia in chronic kidney disease: Secondary | ICD-10-CM | POA: Diagnosis not present

## 2021-08-30 DIAGNOSIS — N186 End stage renal disease: Secondary | ICD-10-CM | POA: Diagnosis not present

## 2021-08-30 DIAGNOSIS — Q612 Polycystic kidney, adult type: Secondary | ICD-10-CM | POA: Diagnosis not present

## 2021-08-30 DIAGNOSIS — N2581 Secondary hyperparathyroidism of renal origin: Secondary | ICD-10-CM | POA: Diagnosis not present

## 2021-08-30 DIAGNOSIS — Z992 Dependence on renal dialysis: Secondary | ICD-10-CM | POA: Diagnosis not present

## 2021-08-31 DIAGNOSIS — N186 End stage renal disease: Secondary | ICD-10-CM | POA: Diagnosis not present

## 2021-08-31 DIAGNOSIS — D631 Anemia in chronic kidney disease: Secondary | ICD-10-CM | POA: Diagnosis not present

## 2021-08-31 DIAGNOSIS — Z992 Dependence on renal dialysis: Secondary | ICD-10-CM | POA: Diagnosis not present

## 2021-08-31 DIAGNOSIS — N2581 Secondary hyperparathyroidism of renal origin: Secondary | ICD-10-CM | POA: Diagnosis not present

## 2021-09-02 ENCOUNTER — Encounter (INDEPENDENT_AMBULATORY_CARE_PROVIDER_SITE_OTHER): Payer: Self-pay | Admitting: Ophthalmology

## 2021-09-02 DIAGNOSIS — Z992 Dependence on renal dialysis: Secondary | ICD-10-CM | POA: Diagnosis not present

## 2021-09-02 DIAGNOSIS — N186 End stage renal disease: Secondary | ICD-10-CM | POA: Diagnosis not present

## 2021-09-02 DIAGNOSIS — D631 Anemia in chronic kidney disease: Secondary | ICD-10-CM | POA: Diagnosis not present

## 2021-09-02 DIAGNOSIS — N2581 Secondary hyperparathyroidism of renal origin: Secondary | ICD-10-CM | POA: Diagnosis not present

## 2021-09-02 MED ORDER — BEVACIZUMAB CHEMO INJECTION 1.25MG/0.05ML SYRINGE FOR KALEIDOSCOPE
1.2500 mg | INTRAVITREAL | Status: AC | PRN
  Administered 2021-09-02: 1.25 mg via INTRAVITREAL

## 2021-09-03 DIAGNOSIS — H4052X4 Glaucoma secondary to other eye disorders, left eye, indeterminate stage: Secondary | ICD-10-CM | POA: Diagnosis not present

## 2021-09-04 DIAGNOSIS — N2581 Secondary hyperparathyroidism of renal origin: Secondary | ICD-10-CM | POA: Diagnosis not present

## 2021-09-04 DIAGNOSIS — N186 End stage renal disease: Secondary | ICD-10-CM | POA: Diagnosis not present

## 2021-09-04 DIAGNOSIS — Z992 Dependence on renal dialysis: Secondary | ICD-10-CM | POA: Diagnosis not present

## 2021-09-04 DIAGNOSIS — D631 Anemia in chronic kidney disease: Secondary | ICD-10-CM | POA: Diagnosis not present

## 2021-09-06 ENCOUNTER — Telehealth: Payer: Self-pay | Admitting: Internal Medicine

## 2021-09-06 DIAGNOSIS — D631 Anemia in chronic kidney disease: Secondary | ICD-10-CM | POA: Diagnosis not present

## 2021-09-06 DIAGNOSIS — N186 End stage renal disease: Secondary | ICD-10-CM | POA: Diagnosis not present

## 2021-09-06 DIAGNOSIS — Z992 Dependence on renal dialysis: Secondary | ICD-10-CM | POA: Diagnosis not present

## 2021-09-06 DIAGNOSIS — N2581 Secondary hyperparathyroidism of renal origin: Secondary | ICD-10-CM | POA: Diagnosis not present

## 2021-09-06 NOTE — Telephone Encounter (Signed)
? ?  Pre-operative Risk Assessment  ?  ?Patient Name: Michael [name].  ?DOB: 03-16-1956 ?[name]: [id number]  ? ?  ? ?Request for Surgical Clearance   ? ?Procedure:  glaucoma tube shunt  ? ?Date of Surgery:  Clearance TBD                              ?   ?Surgeon:  Dr. Lissa Hoard ?Surgeon's Group or Practice Name:  [facility] ?Phone number:  [phone], email: [email or web address] ?Fax number:  not sure ?  ?Type of Clearance Requested:   ?- Medical  ?- Pharmacy:  Hold Apixaban (Eliquis) 2 days prior, if patient is on any other blood thinners they will want to be held up to 5 days ?  ?Type of Anesthesia:  MAC ?  ?Additional requests/questions:   ? ?Signed, ?Selena Zobro   ?09/06/2021, 8:16 AM   ?

## 2021-09-07 NOTE — Telephone Encounter (Signed)
Patient with diagnosis of afib on Eliquis for anticoagulation.   ? ?Procedure: glaucoma tube shunt  ?Date of procedure: TBD ? ?CHA2DS2-VASc Score = 4  ?This indicates a 4.8% annual risk of stroke. ?The patient's score is based upon: ?CHF History: 1 ?HTN History: 1 ?Diabetes History: 0 ?Stroke History: 2 ?Vascular Disease History: 0 ?Age Score: 0 ?Gender Score: 0 ?  ?Pt on dialysis ?Platelet count 115K ? ?Per office protocol, patient can hold Eliquis for 2 days prior to procedure as requested. He should resume as soon as safely possible after due to elevated CV risk. ?

## 2021-09-08 DIAGNOSIS — N186 End stage renal disease: Secondary | ICD-10-CM | POA: Diagnosis not present

## 2021-09-08 DIAGNOSIS — D631 Anemia in chronic kidney disease: Secondary | ICD-10-CM | POA: Diagnosis not present

## 2021-09-08 DIAGNOSIS — Z992 Dependence on renal dialysis: Secondary | ICD-10-CM | POA: Diagnosis not present

## 2021-09-08 DIAGNOSIS — N2581 Secondary hyperparathyroidism of renal origin: Secondary | ICD-10-CM | POA: Diagnosis not present

## 2021-09-08 NOTE — Telephone Encounter (Signed)
? ?  Name: Michael [name].  ?DOB: [birth year]  ?[name]: [id number]  ? ?Primary Cardiologist: Early Osmond, MD ? ?Chart reviewed as part of pre-operative protocol coverage. Patient was contacted 09/08/2021 in reference to pre-operative risk assessment for pending surgery as outlined below.  Michael [name]. was last seen on 04/01/2021 by Dr. Ali Lowe.  Since that day, Michael [name]. has done well without exertional chest pain or worsening shortness of breath.  He has a history of nonischemic cardiomyopathy and persistent atrial fibrillation. ? ?Therefore, based on ACC/AHA guidelines, the patient would be at acceptable risk for the planned procedure without further cardiovascular testing.  ? ?Our clinical pharmacist recommended 2-day hold of Eliquis, unfortunately, patient decided to come off of Eliquis since 09/03/2021.  His procedure is tomorrow, therefore I did not try to restart Eliquis today.  He will need to restart Eliquis as soon as possible afterward at the surgeon's discretion. ? ?I will route this recommendation to the requesting party via Epic fax function and remove from pre-op pool. Please call with questions. ? ?Almyra Deforest, [place] ?09/08/2021, 1:54 PM ? ?

## 2021-09-09 DIAGNOSIS — I082 Rheumatic disorders of both aortic and tricuspid valves: Secondary | ICD-10-CM | POA: Diagnosis not present

## 2021-09-09 DIAGNOSIS — D631 Anemia in chronic kidney disease: Secondary | ICD-10-CM | POA: Diagnosis not present

## 2021-09-09 DIAGNOSIS — H4052X4 Glaucoma secondary to other eye disorders, left eye, indeterminate stage: Secondary | ICD-10-CM | POA: Diagnosis not present

## 2021-09-09 DIAGNOSIS — G4730 Sleep apnea, unspecified: Secondary | ICD-10-CM | POA: Diagnosis not present

## 2021-09-09 DIAGNOSIS — H4089 Other specified glaucoma: Secondary | ICD-10-CM | POA: Diagnosis not present

## 2021-09-09 DIAGNOSIS — I509 Heart failure, unspecified: Secondary | ICD-10-CM | POA: Diagnosis not present

## 2021-09-09 DIAGNOSIS — N186 End stage renal disease: Secondary | ICD-10-CM | POA: Diagnosis not present

## 2021-09-09 DIAGNOSIS — Q613 Polycystic kidney, unspecified: Secondary | ICD-10-CM | POA: Diagnosis not present

## 2021-09-09 DIAGNOSIS — I739 Peripheral vascular disease, unspecified: Secondary | ICD-10-CM | POA: Diagnosis not present

## 2021-09-09 DIAGNOSIS — Z992 Dependence on renal dialysis: Secondary | ICD-10-CM | POA: Diagnosis not present

## 2021-09-09 DIAGNOSIS — I4891 Unspecified atrial fibrillation: Secondary | ICD-10-CM | POA: Diagnosis not present

## 2021-09-11 DIAGNOSIS — N186 End stage renal disease: Secondary | ICD-10-CM | POA: Diagnosis not present

## 2021-09-11 DIAGNOSIS — D631 Anemia in chronic kidney disease: Secondary | ICD-10-CM | POA: Diagnosis not present

## 2021-09-11 DIAGNOSIS — Z992 Dependence on renal dialysis: Secondary | ICD-10-CM | POA: Diagnosis not present

## 2021-09-11 DIAGNOSIS — N2581 Secondary hyperparathyroidism of renal origin: Secondary | ICD-10-CM | POA: Diagnosis not present

## 2021-09-13 NOTE — Progress Notes (Shared)
?[facility] Note ? ?09/17/2021 ?  ? ?CHIEF COMPLAINT ?Patient presents for No chief complaint on file. ? ? ?HISTORY OF PRESENT ILLNESS: ?Michael [name]. is a 66 y.o. male who presents to the clinic today for:  ? ? ? ?Pt states he can see "a little bit" out of his left eye today ? ?Referring physician: ?[name], MD ?[place] ?[address] ?[place],  [place] [address] ? ?HISTORICAL INFORMATION:  ? ?Selected notes from the [facility] ?Referred by Dr. [name]. Hecker for concern of HRVO OD; ?LEE- 11.30.18 ([name]) {BCVA OD: 20/100-1 OS: 20/20-2] ?Ocular Hx- cataract OU ?PMH- HTN, high chol, kidney disease, sleep apnea, emphysema ?  ? ?CURRENT MEDICATIONS: ?Current Outpatient Medications (Ophthalmic Drugs)  ?Medication Sig  ? atropine 1 % ophthalmic solution Place 1 drop into the left eye in the morning and at bedtime.  ? brimonidine (ALPHAGAN) 0.2 % ophthalmic solution Place 1 drop into the left eye 3 (three) times daily.  ? dorzolamide-timolol (COSOPT) 22.3-6.8 MG/ML ophthalmic solution Place 1 drop into the left eye 3 (three) times daily.  ? Loteprednol Etabonate (LOTEMAX SM) 0.38 % GEL Place 1 drop into the left eye every 2 (two) hours.  ? sodium chloride (MURO 128) 2 % ophthalmic solution Place 1 drop into the left eye 4 (four) times daily.  ? ?No current facility-administered medications for this visit. (Ophthalmic Drugs)  ? ?Current Outpatient Medications (Other)  ?Medication Sig  ? apixaban (ELIQUIS) 5 MG TABS tablet Take 1 tablet by mouth twice daily (Patient taking differently: Take 5 mg by mouth daily.)  ? cinacalcet (SENSIPAR) 30 MG tablet Take 30 mg by mouth daily.  ? clotrimazole-betamethasone (LOTRISONE) cream Apply 1 application topically 2 (two) times daily.  ? losartan (COZAAR) 25 MG tablet Take 0.5 tablets (12.5 mg total) by mouth daily. (Patient taking differently: Take 25 mg by mouth daily.)  ? metoprolol succinate (TOPROL XL) 25 MG 24 hr tablet Take 1  tablet (25 mg total) by mouth at bedtime.  ? nitroGLYCERIN (NITROSTAT) 0.4 MG SL tablet Place 1 tablet (0.4 mg total) under the tongue every 5 (five) minutes x 3 doses as needed for chest pain.  ? rosuvastatin (CRESTOR) 10 MG tablet Take 1 tablet (10 mg total) by mouth daily.  ? sevelamer carbonate (RENVELA) 800 MG tablet Take 1,600 mg by mouth 3 (three) times daily with meals.  ? ?No current facility-administered medications for this visit. (Other)  ? ?REVIEW OF SYSTEMS: ? ? ? ?ALLERGIES ?Allergies  ?Allergen Reactions  ? Colchicine Other (See Comments)  ?  Other reaction(s): foot pain  ? Indapamide Other (See Comments)  ?  unknown  ? Lipitor [Atorvastatin] Itching  ?  Leg pain  ? Viagra [Sildenafil] Other (See Comments)  ?  Feels weird - Levitra. - headache  ? Zyloprim [Allopurinol] Other (See Comments)  ?  foot pain  ? ?PAST MEDICAL HISTORY ?Past Medical History:  ?Diagnosis Date  ? Atrial arrhythmia   ? atrial tachycardia with variable AV conduction versus atypical aflutter 01/10/19, rate control (02/06/19)  ? Cataract   ? Dyspnea   ? on exertion  ? ESRD (end stage renal disease) ([place])   ? Hemo- MWF, Polycystic kidney disease  ? Fatigue   ? History of kidney stones   ? removal of stone- cysto  ? Hyperlipidemia   ? Hyperparathyroidism, secondary renal ([place])   ? Hypertension   ? Hypoxemia 12/12/2013  ? Nonischemic cardiomyopathy ([place])   ?  Er 25% 2015, 55 % 2013  ? OSA on CPAP   ? no longer using cpap  ? OSA on CPAP 03/24/2014  ? Pneumonia   ? 2015ish  ? Prostate cancer ([place])   ? Ventricular tachycardia//Freq PVCs   ? Wears glasses   ? ?Past Surgical History:  ?Procedure Laterality Date  ? A-FLUTTER ABLATION N/A 04/11/2019  ? Procedure: A-FLUTTER ABLATION;  Surgeon: Evans Lance, MD;  Location: [facility];  Service: Cardiovascular;  Laterality: N/A;  ? A/V FISTULAGRAM Left 04/27/2017  ? Procedure: A/V FISTULAGRAM;  Surgeon: Conrad Playita, MD;  Location: [facility];  Service: Cardiovascular;   Laterality: Left;  lt arm  ? A/V FISTULAGRAM Left 01/10/2019  ? Procedure: A/V FISTULAGRAM;  Surgeon: Marty Heck, MD;  Location: [facility];  Service: Cardiovascular;  Laterality: Left;  ? APPENDECTOMY    ? AV FISTULA PLACEMENT  12/05/2011  ? Procedure: ARTERIOVENOUS (AV) FISTULA CREATION;LLEFT ARM  Surgeon: Conrad Shonto, MD;  Location: [facility];  Service: Vascular;  Laterality: Left;  RADIO-CEPHALIC  fistula left arm  ? AV FISTULA PLACEMENT  01/11/2012  ? Procedure: ARTERIOVENOUS (AV) FISTULA CREATION;  Surgeon: Conrad [place], MD;  Location: [facility];  Service: Vascular;  Laterality: Left;  Creation of left brachial cephalic arteriovenous fistula  ? AV FISTULA PLACEMENT Right 03/07/2019  ? Procedure: ARTERIOVENOUS (AV) FISTULA CREATION  RIGHT ARM;  Surgeon: Marty Heck, MD;  Location: [facility];  Service: Vascular;  Laterality: Right;  ? BASCILIC VEIN TRANSPOSITION Left 12/27/2016  ? Procedure: BASILIC VEIN TRANSPOSITION LEFT UPPER ARM FIRST STAGE;  Surgeon: Conrad [place], MD;  Location: [facility];  Service: Vascular;  Laterality: Left;  ? BASCILIC VEIN TRANSPOSITION Left 01/31/2017  ? Procedure: LEFT ARM BASILIC VEIN TRANSPOSITION, SECOND STAGE;  Surgeon: Conrad Larwill, MD;  Location: [facility];  Service: Vascular;  Laterality: Left;  ? CARDIAC CATHETERIZATION  04-05-2010  ? checking for blockage but none-[facility]  ? COLONOSCOPY    ? CYSTOSCOPY W/ STONE MANIPULATION    ? "laser once" (01/22/2013)  ? HEMATOMA EVACUATION Left 05/09/2017  ? Procedure: EVACUATION HEMATOMA LEFT ARM;  Surgeon: Conrad Brusly, MD;  Location: [facility];  Service: Vascular;  Laterality: Left;  ? HERNIA REPAIR    ? INGUINAL HERNIA REPAIR Right 11/06/2015  ? Procedure: OPEN HERNIA REPAIR  RIGHT INGUINAL ADULT;  Surgeon: [name], MD;  Location: [facility];  Service: General;  Laterality: Right;  with MESH  ? INSERTION OF DIALYSIS CATHETER Right 10/05/2016  ? Procedure: INSERTION OF right internal jugular DIALYSIS CATHETER;  Surgeon: [name], MD;   Location: [facility];  Service: Vascular;  Laterality: Right;  ? INSERTION OF MESH  03/20/2012  ? Procedure: INSERTION OF MESH;  UMB Surgeon: [name], MD;  Location: [facility];  Service: General;  Laterality: N/A;  ? INSERTION OF MESH N/A 01/22/2013  ? Procedure: INSERTION OF MESH;  Surgeon: [name], MD;  Location: [facility];  Service: General;  Laterality: N/A;  ? LAPAROTOMY  04/02/2012  ? Procedure: EXPLORATORY LAPAROTOMY;  Surgeon: [name], MD;  Location: [facility];  Service: General;  Laterality: N/A;  Exploratory Laparotomy with resection of small intestine  ? LEFT HEART CATHETERIZATION WITH CORONARY ANGIOGRAM N/A 05/14/2013  ? Procedure: LEFT HEART CATHETERIZATION WITH CORONARY ANGIOGRAM;  Surgeon: Sinclair Grooms, MD;  [facility]: [facility];  Service: Cardiovascular;  Laterality: N/A;  ? LIGATION OF ARTERIOVENOUS  FISTULA Left 12/27/2016  ?  Procedure: LIGATION/EXCISION OF LEFT UPPER ARM ARTERIOVENOUS  FISTULA;  EVACUATION OF HEMATOMA;  Surgeon: Conrad [place], MD;  Location: [facility];  Service: Vascular;  Laterality: Left;  ? LIGATION OF ARTERIOVENOUS  FISTULA Left 03/07/2019  ? Procedure: LIGATION OF ARTERIOVENOUS FISTULA  LEFT UPPER ARM;  Surgeon: Marty Heck, MD;  Location: [facility];  Service: Vascular;  Laterality: Left;  ? REVISON OF ARTERIOVENOUS FISTULA Left 10/05/2016  ? Procedure: REVISON OF left arm ARTERIOVENOUS FISTULA;  Surgeon: [name], MD;  Location: [facility];  Service: Vascular;  Laterality: Left;  ? TONSILLECTOMY    ? UMBILICAL HERNIA REPAIR  03/20/2012  ? Procedure: HERNIA REPAIR UMBILICAL ADULT;  Surgeon: [name], MD;  Location: [facility];  Service: General;  Laterality: N/A;  ? UMBILICAL HERNIA REPAIR  01/22/2013  ? preperitoneal open procedure due to significant adhesions/notes 01/22/2013  ? VENTRAL HERNIA REPAIR N/A 01/22/2013  ? Procedure: ATTEMPTED LAPAROSCOPIC VENTRAL HERNIA CONVERTED TO OPEN;  Surgeon: [name], MD;  Location: [facility];  Service: General;   Laterality: N/A;  ? ?FAMILY HISTORY ?Family History  ?Problem Relation Age of Onset  ? Heart disease Mother   ? Hyperlipidemia Mother   ? Hypertension Mother   ? Kidney disease Father   ? Stroke Father   ? Kidney d

## 2021-09-14 DIAGNOSIS — D631 Anemia in chronic kidney disease: Secondary | ICD-10-CM | POA: Diagnosis not present

## 2021-09-14 DIAGNOSIS — Z992 Dependence on renal dialysis: Secondary | ICD-10-CM | POA: Diagnosis not present

## 2021-09-14 DIAGNOSIS — N186 End stage renal disease: Secondary | ICD-10-CM | POA: Diagnosis not present

## 2021-09-14 DIAGNOSIS — N2581 Secondary hyperparathyroidism of renal origin: Secondary | ICD-10-CM | POA: Diagnosis not present

## 2021-09-16 DIAGNOSIS — N2581 Secondary hyperparathyroidism of renal origin: Secondary | ICD-10-CM | POA: Diagnosis not present

## 2021-09-16 DIAGNOSIS — N186 End stage renal disease: Secondary | ICD-10-CM | POA: Diagnosis not present

## 2021-09-16 DIAGNOSIS — Z992 Dependence on renal dialysis: Secondary | ICD-10-CM | POA: Diagnosis not present

## 2021-09-16 DIAGNOSIS — D631 Anemia in chronic kidney disease: Secondary | ICD-10-CM | POA: Diagnosis not present

## 2021-09-17 ENCOUNTER — Encounter (INDEPENDENT_AMBULATORY_CARE_PROVIDER_SITE_OTHER): Payer: Medicare Other | Admitting: Ophthalmology

## 2021-09-17 DIAGNOSIS — H43813 Vitreous degeneration, bilateral: Secondary | ICD-10-CM

## 2021-09-17 DIAGNOSIS — B0052 Herpesviral keratitis: Secondary | ICD-10-CM

## 2021-09-17 DIAGNOSIS — H1820 Unspecified corneal edema: Secondary | ICD-10-CM

## 2021-09-17 DIAGNOSIS — H35412 Lattice degeneration of retina, left eye: Secondary | ICD-10-CM

## 2021-09-17 DIAGNOSIS — H209 Unspecified iridocyclitis: Secondary | ICD-10-CM

## 2021-09-17 DIAGNOSIS — H34812 Central retinal vein occlusion, left eye, with macular edema: Secondary | ICD-10-CM

## 2021-09-17 DIAGNOSIS — H35033 Hypertensive retinopathy, bilateral: Secondary | ICD-10-CM

## 2021-09-17 DIAGNOSIS — H4052X2 Glaucoma secondary to other eye disorders, left eye, moderate stage: Secondary | ICD-10-CM

## 2021-09-17 DIAGNOSIS — I10 Essential (primary) hypertension: Secondary | ICD-10-CM

## 2021-09-17 DIAGNOSIS — H34831 Tributary (branch) retinal vein occlusion, right eye, with macular edema: Secondary | ICD-10-CM

## 2021-09-17 DIAGNOSIS — H25813 Combined forms of age-related cataract, bilateral: Secondary | ICD-10-CM

## 2021-09-17 DIAGNOSIS — H33332 Multiple defects of retina without detachment, left eye: Secondary | ICD-10-CM

## 2021-09-18 DIAGNOSIS — N2581 Secondary hyperparathyroidism of renal origin: Secondary | ICD-10-CM | POA: Diagnosis not present

## 2021-09-18 DIAGNOSIS — Z992 Dependence on renal dialysis: Secondary | ICD-10-CM | POA: Diagnosis not present

## 2021-09-18 DIAGNOSIS — N186 End stage renal disease: Secondary | ICD-10-CM | POA: Diagnosis not present

## 2021-09-18 DIAGNOSIS — D631 Anemia in chronic kidney disease: Secondary | ICD-10-CM | POA: Diagnosis not present

## 2021-09-21 DIAGNOSIS — Z992 Dependence on renal dialysis: Secondary | ICD-10-CM | POA: Diagnosis not present

## 2021-09-21 DIAGNOSIS — N186 End stage renal disease: Secondary | ICD-10-CM | POA: Diagnosis not present

## 2021-09-21 DIAGNOSIS — D631 Anemia in chronic kidney disease: Secondary | ICD-10-CM | POA: Diagnosis not present

## 2021-09-21 DIAGNOSIS — N2581 Secondary hyperparathyroidism of renal origin: Secondary | ICD-10-CM | POA: Diagnosis not present

## 2021-09-23 DIAGNOSIS — D631 Anemia in chronic kidney disease: Secondary | ICD-10-CM | POA: Diagnosis not present

## 2021-09-23 DIAGNOSIS — Z992 Dependence on renal dialysis: Secondary | ICD-10-CM | POA: Diagnosis not present

## 2021-09-23 DIAGNOSIS — N2581 Secondary hyperparathyroidism of renal origin: Secondary | ICD-10-CM | POA: Diagnosis not present

## 2021-09-23 DIAGNOSIS — N186 End stage renal disease: Secondary | ICD-10-CM | POA: Diagnosis not present

## 2021-09-25 DIAGNOSIS — N2581 Secondary hyperparathyroidism of renal origin: Secondary | ICD-10-CM | POA: Diagnosis not present

## 2021-09-25 DIAGNOSIS — N186 End stage renal disease: Secondary | ICD-10-CM | POA: Diagnosis not present

## 2021-09-25 DIAGNOSIS — Z992 Dependence on renal dialysis: Secondary | ICD-10-CM | POA: Diagnosis not present

## 2021-09-25 DIAGNOSIS — D631 Anemia in chronic kidney disease: Secondary | ICD-10-CM | POA: Diagnosis not present

## 2021-09-27 DIAGNOSIS — Z992 Dependence on renal dialysis: Secondary | ICD-10-CM | POA: Diagnosis not present

## 2021-09-27 DIAGNOSIS — D631 Anemia in chronic kidney disease: Secondary | ICD-10-CM | POA: Diagnosis not present

## 2021-09-27 DIAGNOSIS — N2581 Secondary hyperparathyroidism of renal origin: Secondary | ICD-10-CM | POA: Diagnosis not present

## 2021-09-27 DIAGNOSIS — N186 End stage renal disease: Secondary | ICD-10-CM | POA: Diagnosis not present

## 2021-09-30 DIAGNOSIS — N2581 Secondary hyperparathyroidism of renal origin: Secondary | ICD-10-CM | POA: Diagnosis not present

## 2021-09-30 DIAGNOSIS — Z992 Dependence on renal dialysis: Secondary | ICD-10-CM | POA: Diagnosis not present

## 2021-09-30 DIAGNOSIS — N186 End stage renal disease: Secondary | ICD-10-CM | POA: Diagnosis not present

## 2021-09-30 DIAGNOSIS — D631 Anemia in chronic kidney disease: Secondary | ICD-10-CM | POA: Diagnosis not present

## 2021-09-30 DIAGNOSIS — Q612 Polycystic kidney, adult type: Secondary | ICD-10-CM | POA: Diagnosis not present

## 2021-10-01 ENCOUNTER — Encounter (INDEPENDENT_AMBULATORY_CARE_PROVIDER_SITE_OTHER): Payer: Medicare Other | Admitting: Ophthalmology

## 2021-10-01 NOTE — Progress Notes (Signed)
[facility] Note  10/04/2021    CHIEF COMPLAINT Patient presents for Retina Follow Up   HISTORY OF PRESENT ILLNESS: Michael [name]. is a 66 y.o. male who presents to the clinic today for:   HPI     Retina Follow Up   Patient presents with  CRVO/BRVO (IVA OS #6 (04.28.23)).  In left eye.  This started months ago.  Duration of 5.5 weeks.  I, the attending physician,  performed the HPI with the patient and updated documentation appropriately.        Comments   Patient states that he had Glaucoma surgery on the left eye last month. He feels like he is getting some of the vision back in the left eye. He using Cosopt OS BID and the Lotemax OS five times a day.       Last edited by [name], MD on 10/06/2021 11:24 PM.    Pt had glaucoma sx at [facility] on 05.19.23, he states he has to go back in 2 weeks  Referring physician: [name], MD [place],  [place] [id number]  HISTORICAL INFORMATION:   Selected notes from the [facility] Referred by Dr. [name]. Hecker for concern of HRVO OD; LEE- 11.30.18 ([name]) {BCVA OD: 20/100-1 OS: 20/20-2] Ocular Hx- cataract OU PMH- HTN, high chol, kidney disease, sleep apnea, emphysema    CURRENT MEDICATIONS: Current Outpatient Medications (Ophthalmic Drugs)  Medication Sig   dorzolamide-timolol (COSOPT) 22.3-6.8 MG/ML ophthalmic solution Place 1 drop into the left eye 3 (three) times daily.   Loteprednol Etabonate (LOTEMAX SM) 0.38 % GEL Place 1 drop into the left eye every 2 (two) hours.   sodium chloride (MURO 128) 2 % ophthalmic solution Place 1 drop into the left eye 4 (four) times daily.   atropine 1 % ophthalmic solution Place 1 drop into the left eye in the morning and at bedtime. (Patient not taking: Reported on 10/04/2021)   brimonidine (ALPHAGAN) 0.2 % ophthalmic solution Place 1 drop into the left eye 3 (three) times daily. (Patient not taking: Reported on 10/04/2021)    No current facility-administered medications for this visit. (Ophthalmic Drugs)   Current Outpatient Medications (Other)  Medication Sig   apixaban (ELIQUIS) 5 MG TABS tablet Take 1 tablet by mouth twice daily (Patient taking differently: Take 5 mg by mouth daily.)   cinacalcet (SENSIPAR) 30 MG tablet Take 30 mg by mouth daily.   clotrimazole-betamethasone (LOTRISONE) cream Apply 1 application topically 2 (two) times daily.   losartan (COZAAR) 25 MG tablet Take 0.5 tablets (12.5 mg total) by mouth daily. (Patient taking differently: Take 25 mg by mouth daily.)   metoprolol succinate (TOPROL XL) 25 MG 24 hr tablet Take 1 tablet (25 mg total) by mouth at bedtime.   nitroGLYCERIN (NITROSTAT) 0.4 MG SL tablet Place 1 tablet (0.4 mg total) under the tongue every 5 (five) minutes x 3 doses as needed for chest pain.   rosuvastatin (CRESTOR) 10 MG tablet Take 1 tablet (10 mg total) by mouth daily.   sevelamer carbonate (RENVELA) 800 MG tablet Take 1,600 mg by mouth 3 (three) times daily with meals.   No current facility-administered medications for this visit. (Other)   REVIEW OF SYSTEMS: ROS   Positive for: Genitourinary, Cardiovascular, Eyes, Respiratory Negative for: Constitutional, Gastrointestinal, Neurological, Skin, Musculoskeletal, HENT, Endocrine, Psychiatric, Allergic/Imm, Heme/Lymph Last edited by Annie Paras, COT on 10/04/2021 10:00 AM.     ALLERGIES Allergies  Allergen Reactions   Colchicine Other (See Comments)    Other reaction(s): foot pain   Indapamide Other (See Comments)    unknown   Lipitor [Atorvastatin] Itching    Leg pain   Viagra [Sildenafil] Other (See Comments)    Feels weird - Levitra. - headache   Zyloprim [Allopurinol] Other (See Comments)    foot pain   PAST MEDICAL HISTORY Past Medical History:  Diagnosis Date   Atrial arrhythmia    atrial tachycardia with variable AV conduction versus atypical aflutter 01/10/19, rate control (02/06/19)    Cataract    Dyspnea    on exertion   ESRD (end stage renal disease) ([place])    Hemo- MWF, Polycystic kidney disease   Fatigue    History of kidney stones    removal of stone- cysto   Hyperlipidemia    Hyperparathyroidism, secondary renal ([place])    Hypertension    Hypoxemia 12/12/2013   Nonischemic cardiomyopathy ([place])    Er 25% 2015, 55 % 2013   OSA on CPAP    no longer using cpap   OSA on CPAP 03/24/2014   Pneumonia    2015ish   Prostate cancer ([place])    Ventricular tachycardia//Freq PVCs    Wears glasses    Past Surgical History:  Procedure Laterality Date   A-FLUTTER ABLATION N/A 04/11/2019   Procedure: A-FLUTTER ABLATION;  Surgeon: Evans Lance, MD;  Location: [facility] [facility];  Service: Cardiovascular;  Laterality: N/A;   A/V FISTULAGRAM Left 04/27/2017   Procedure: A/V FISTULAGRAM;  Surgeon: Conrad Frankford, MD;  Location: [place] [facility];  Service: Cardiovascular;  Laterality: Left;  lt arm   A/V FISTULAGRAM Left 01/10/2019   Procedure: A/V FISTULAGRAM;  Surgeon: Marty Heck, MD;  Location: [place] [facility];  Service: Cardiovascular;  Laterality: Left;   APPENDECTOMY     AV FISTULA PLACEMENT  12/05/2011   Procedure: ARTERIOVENOUS (AV) FISTULA CREATION;LLEFT ARM  Surgeon: Conrad [place], MD;  Location: [facility];  Service: Vascular;  Laterality: Left;  RADIO-CEPHALIC  fistula left arm   AV FISTULA PLACEMENT  01/11/2012   Procedure: ARTERIOVENOUS (AV) FISTULA CREATION;  Surgeon: Conrad Windsor, MD;  Location: [facility] [place];  Service: Vascular;  Laterality: Left;  Creation of left brachial cephalic arteriovenous fistula   AV FISTULA PLACEMENT Right 03/07/2019   Procedure: ARTERIOVENOUS (AV) FISTULA CREATION  RIGHT ARM;  Surgeon: Marty Heck, MD;  Location: [place];  Service: Vascular;  Laterality: Right;   [place] Left 12/27/2016   Procedure: BASILIC VEIN TRANSPOSITION LEFT UPPER ARM FIRST STAGE;  Surgeon: Conrad Alpha, MD;  Location: [facility];  Service:  Vascular;  Laterality: Left;   [place] Left 01/31/2017   Procedure: LEFT ARM BASILIC VEIN TRANSPOSITION, SECOND STAGE;  Surgeon: Conrad Beemer, MD;  Location: [place] [facility];  Service: Vascular;  Laterality: Left;   CARDIAC CATHETERIZATION  04-05-2010   checking for blockage but none-[facility]   COLONOSCOPY     CYSTOSCOPY W/ STONE MANIPULATION     "laser once" (01/22/2013)   HEMATOMA EVACUATION Left 05/09/2017   Procedure: EVACUATION HEMATOMA LEFT ARM;  Surgeon: Conrad McNeil, MD;  Location: [facility];  Service: Vascular;  Laterality: Left;   HERNIA REPAIR     INGUINAL HERNIA REPAIR Right 11/06/2015   Procedure: OPEN HERNIA REPAIR  RIGHT INGUINAL ADULT;  Surgeon: [name], MD;  Location: [facility];  Service: General;  Laterality: Right;  with MESH   INSERTION OF  DIALYSIS CATHETER Right 10/05/2016   Procedure: INSERTION OF right internal jugular DIALYSIS CATHETER;  Surgeon: [name], MD;  Location: [facility];  Service: Vascular;  Laterality: Right;   INSERTION OF MESH  03/20/2012   Procedure: INSERTION OF MESH;  Danella Maiers Surgeon: [name], MD;  Location: [facility];  Service: General;  Laterality: N/A;   INSERTION OF MESH N/A 01/22/2013   Procedure: INSERTION OF MESH;  Surgeon: [name], MD;  Location: [place] [facility];  Service: General;  Laterality: N/A;   LAPAROTOMY  04/02/2012   Procedure: EXPLORATORY LAPAROTOMY;  Surgeon: [name], MD;  Location: [place] [place];  Service: General;  Laterality: N/A;  Exploratory Laparotomy with resection of small intestine   LEFT HEART CATHETERIZATION WITH CORONARY ANGIOGRAM N/A 05/14/2013   Procedure: LEFT HEART CATHETERIZATION WITH CORONARY ANGIOGRAM;  Surgeon: Sinclair Grooms, MD;  [facility];  Service: Cardiovascular;  Laterality: N/A;   LIGATION OF ARTERIOVENOUS  FISTULA Left 12/27/2016   Procedure: LIGATION/EXCISION OF LEFT UPPER ARM ARTERIOVENOUS  FISTULA;  EVACUATION OF HEMATOMA;  Surgeon: Conrad Knierim, MD;  Location: [facility];  Service:  Vascular;  Laterality: Left;   LIGATION OF ARTERIOVENOUS  FISTULA Left 03/07/2019   Procedure: LIGATION OF ARTERIOVENOUS FISTULA  LEFT UPPER ARM;  Surgeon: Marty Heck, MD;  Location: [facility];  Service: Vascular;  Laterality: Left;   REVISON OF ARTERIOVENOUS FISTULA Left 10/05/2016   Procedure: REVISON OF left arm ARTERIOVENOUS FISTULA;  Surgeon: [name], MD;  Location: [place];  Service: Vascular;  Laterality: Left;   TONSILLECTOMY     UMBILICAL HERNIA REPAIR  03/20/2012   Procedure: HERNIA REPAIR UMBILICAL ADULT;  Surgeon: [name], MD;  Location: [place];  Service: General;  Laterality: N/A;   UMBILICAL HERNIA REPAIR  01/22/2013   preperitoneal open procedure due to significant adhesions/notes 01/22/2013   VENTRAL HERNIA REPAIR N/A 01/22/2013   Procedure: ATTEMPTED LAPAROSCOPIC VENTRAL HERNIA CONVERTED TO OPEN;  Surgeon: [name], MD;  Location: [facility];  Service: General;  Laterality: N/A;   FAMILY HISTORY Family History  Problem Relation Age of Onset   Heart disease Mother    Hyperlipidemia Mother    Hypertension Mother    Kidney disease Father    Stroke Father    Kidney disease Brother    Amblyopia Neg Hx    Blindness Neg Hx    Cataracts Neg Hx    Diabetes Neg Hx    Glaucoma Neg Hx    Macular degeneration Neg Hx    Retinal detachment Neg Hx    Strabismus Neg Hx    Retinitis pigmentosa Neg Hx    Breast cancer Neg Hx    Prostate cancer Neg Hx    Colon cancer Neg Hx    Pancreatic cancer Neg Hx    SOCIAL HISTORY Social History   Tobacco Use   Smoking status: Never   Smokeless tobacco: Never  Vaping Use   Vaping Use: Never used  Substance Use Topics   Alcohol use: No    Alcohol/week: 0.0 standard drinks of alcohol   Drug use: No       OPHTHALMIC EXAM:  Base Eye Exam     Visual Acuity (Snellen - Linear)       Right Left   Dist cc 20/30 CF at 3'   Dist ph cc NI NI    Correction: Glasses         Tonometry (Tonopen, 10:07 AM)  Right Left   Pressure 20 8         Pupils       Dark Light Shape React APD   Right 6 7 Round Minimal None   Left 6 7 Round Minimal None         Visual Fields       Left Right     Full   Restrictions Partial outer superior temporal, inferior temporal, superior nasal, inferior nasal deficiencies          Extraocular Movement       Right Left    Full, Ortho Full, Ortho         Neuro/Psych     Oriented x3: Yes   Mood/Affect: Normal         Dilation     Both eyes: 1.0% Mydriacyl, 2.5% Phenylephrine @ 10:05 AM           Slit Lamp and Fundus Exam     Slit Lamp Exam       Right Left   Lids/Lashes Dermatochalasis - upper lid Dermatochalasis - upper lid   Conjunctiva/Sclera Mild Melanosis, Nasal Pinguecula, trace Injection Mild Melanosis, ahmed valve ST quad, patch graft in good position, vicryl sutures intact, nasal and temporal Pinguecula   Cornea Arcus, trace Punctate epithelial erosions, mild corneal haze arcus, 1+ Descemet's folds, fine endo pigment   Anterior Chamber Deep and quiet Deep, narrow angles, tube at 0100   Iris Round and dilated, No NVI Round and dilated, +NVI IN pupil margin -- stably regressed   Lens 2-3+ Nuclear sclerosis, 2-3+ Cortical cataract 3+ Nuclear sclerosis, 3+ Cortical cataract, mild pigment deposition on anterior capsule   Anterior Vitreous Vitreous syneresis, +Posterior vitreous detachment, trace cell No view         Fundus Exam       Right Left   Disc mild Pallor, Sharp rim mild Pallor, +heme from 0730-0900, attenuated vessels   C/D Ratio 0.5 0.5   Macula Flat, Blunted foveal reflex, Drusen, RPE mottling, No heme or edema hazy view, Blunted foveal reflex, blot heme superior mac   Vessels attenuated, Copper wiring, Tortuous, peripheral vascular attenuation attenuated, Tortuous, +CRVO   Periphery Attached, pigmented Chorioretinal scar at 1030, scattered DBH inferior and temporal quads Attached, pigmented lattice with 3  atrophic holes at 1030 ora - good laser surrounding, 360 DBH           Refraction     Wearing Rx       Sphere Cylinder Axis Add   Right +0.50 +0.50 168 +2.75   Left +0.50 +0.50 102 +2.75    Type: Bifocal           IMAGING AND PROCEDURES  Imaging and Procedures for 08/31/17  OCT, Retina - OU - Both Eyes       Right Eye Quality was good. Central Foveal Thickness: 233. Progression has been stable. Findings include normal foveal contour, no SRF, no IRF, vitreomacular adhesion , inner retinal atrophy (Inner retinal atrophy IT quad caught on widefield - stable).   Left Eye Quality was poor. Central Foveal Thickness: 266. Progression has improved. Findings include no IRF, no SRF, outer retinal atrophy, abnormal foveal contour, intraretinal hyper-reflective material, subretinal hyper-reflective material (Focal SRHM with surrounding shallow SRF ST macula seen best on widefield, central ellipsoid loss).   Notes *Images captured and stored on drive  Diagnosis / Impression:  OD: NFP, No IRF/SRF; Inner retinal atrophy IT quad caught on widefield - stable OS: Focal SRHM  with surrounding shallow SRF ST macula seen best on widefield, central ellipsoid loss  Clinical management:  See below  Abbreviations: NFP - Normal foveal profile. CME - cystoid macular edema. PED - pigment epithelial detachment. IRF - intraretinal fluid. SRF - subretinal fluid. EZ - ellipsoid zone. ERM - epiretinal membrane. ORA - outer retinal atrophy. ORT - outer retinal tubulation. SRHM - subretinal hyper-reflective material               ASSESSMENT/PLAN:    ICD-10-CM   1. Central retinal vein occlusion with macular edema of left eye  H34.8120 OCT, Retina - OU - Both Eyes    2. Essential hypertension  I10     3. Hypertensive retinopathy of both eyes  H35.033     4. Herpes keratitis  B00.52     5. Anterior uveitis  H20.9     6. Branch retinal vein occlusion of right eye with macular edema   H34.8310     7. Lattice degeneration of left retina  H35.412     8. Atrophic retinal break, multiple, left eye  H33.332     9. Posterior vitreous detachment of both eyes  H43.813     10. Combined forms of age-related cataract of both eyes  H25.813     11. Neovascular glaucoma of left eye, moderate stage  H40.52X2     12. Corneal edema of left eye  H18.20       1. CRVO converted from Inferior HRVO OS  - pt lost to follow up from 08.03.21 to 03.21.23 -- undergoing treatments for prostate cancer  - just prior to 9.24.20 visit, found to have a clot in fistula, left arm  - s/p IVA OS #1 (09.24.20), #2 (10.22.20), #3 (11.23.20), #4 (1.14.21), #5 (03.30.23), #6 (04.28.23)  - BCVA CF3' OS -- mostly due to corneal edema  - OCT OS: Focal SRHM with surrounding shallow SRF ST macula seen best on widefield, central ellipsoid loss  - exam shows +NVI stably regressed post IVA OS on 3.30.23, persistent DBH 360  - IOP improved post Ahmed valve w/ Dr. Lyndel Safe [facility])  - will hold off on IVA OS for now - Avastin informed consent obtained and re-signed, 03.30.23 - F/U 2-3 wks -  DFE/OCT  2,3. Hypertensive retinopathy OU - discussed importance of tight BP control - monitor  4,5. 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 Lucianne Lei (400 mg BID)  - acyclovir stopped during recent hospitalization due to possible toxicity  6. History of BRVO w/ CME  OD - s/p IVA OD #1 (12.21.18), #2 (01.18.19), #3 (02.14.19), #4 (04.02.19), #5 (05.02.19)  - OCT today with no IRF/SRF despite no antiVEGF therapy since May 2019 -- stably resolved  - will continue to hold injection  7,8. Lattice degeneration with atrophic holes OS-   - peripheral holes superiorly at 1030 without SRF or RD  - S/P laser retinopexy OS (  01.04.19)  - good laser in place--stable  9. PVD / vitreous syneresis OU  - Discussed findings and prognosis  - No RT or RD on 360 scleral depressed exam  - Reviewed s/s of RT/RD  - strict return precautions for any such RT/RD symptoms  10. Combined forms age-related cataract OU-   - The symptoms of cataract, surgical options, and treatments and risks were discussed with patient.  - discussed diagnosis and progression  - approaching visual significance  11,12. Neovascular glaucoma w/ corneal edema OS  **pt hospitalized on 04.11.23 for altered mental state**  - s/p ahmed valve OS (05.19.23, Dr. Lyndel Safe)  - corneal edema improved  - using Cosopt BID OU and Lotemax 5x/day OS  - off Muro-128 QID OS  - off diamox  - app[facility] in [place] 06.13.23  - f/u 3-4 weeks DFE, OCT  Ophthalmic Meds Ordered this visit:  No orders of the defined types were placed in this encounter.    Return for f/u 3-4 weeks, CRVO OS, DFE, OCT.  There are no Patient Instructions on file for this visit.  This document serves as a record of services personally performed by Gardiner Sleeper, MD, PhD. It was created on their behalf by Roselee Nova, COMT. The creation of this record is the provider's dictation and/or activities during the visit.  Electronically signed by: Roselee Nova, COMT 10/07/21 1:47 PM  This document serves as a record of services personally performed by Gardiner Sleeper, MD, PhD. It was created on their behalf by San Jetty. Owens Shark, OA an ophthalmic technician. The creation of this record is the provider's dictation and/or activities  during the visit.    Electronically signed by: San Jetty. Owens Shark, [place] 06.05.2023 1:47 PM  Gardiner Sleeper, M.D., Ph.D. Diseases & Surgery of the Retina and Vitreous [facility]  I have reviewed the above documentation for accuracy and completeness, and I agree with the above. Gardiner Sleeper, M.D., Ph.D. 10/07/21 1:47 PM  Abbreviations: M myopia (nearsighted); A astigmatism; H hyperopia (farsighted); P presbyopia; Mrx spectacle prescription;  CTL contact lenses; OD right eye; OS left eye; OU both eyes  XT exotropia; ET esotropia; PEK punctate epithelial keratitis; PEE punctate epithelial erosions; DES dry eye syndrome; MGD meibomian gland dysfunction; ATs artificial tears; PFAT's preservative free artificial tears; Wise nuclear sclerotic cataract; PSC posterior subcapsular cataract; ERM epi-retinal membrane; PVD posterior vitreous detachment; RD retinal detachment; DM diabetes mellitus; DR diabetic retinopathy; NPDR non-proliferative diabetic retinopathy; PDR proliferative diabetic retinopathy; CSME clinically significant macular edema; DME diabetic macular edema; dbh dot blot hemorrhages; CWS cotton wool spot; POAG primary open angle glaucoma; C/D cup-to-disc ratio; HVF humphrey visual field; GVF goldmann visual field; OCT optical coherence tomography; IOP intraocular pressure; BRVO Branch retinal vein occlusion; CRVO central retinal vein occlusion; CRAO central retinal artery occlusion; BRAO branch retinal artery occlusion; RT retinal tear; SB scleral buckle; PPV pars plana vitrectomy; VH Vitreous hemorrhage; PRP panretinal laser photocoagulation; IVK intravitreal kenalog; VMT vitreomacular traction; MH Macular hole;  NVD neovascularization of the disc; NVE neovascularization elsewhere; AREDS age related eye disease study; ARMD age related macular degeneration; POAG primary open angle glaucoma; EBMD epithelial/anterior basement membrane dystrophy; ACIOL anterior chamber intraocular  lens; IOL intraocular lens; PCIOL posterior chamber intraocular lens; Phaco/IOL phacoemulsification with intraocular lens placement; [place] photorefractive keratectomy; LASIK laser assisted in situ keratomileusis; HTN hypertension; DM diabetes mellitus; COPD chronic obstructive pulmonary disease

## 2021-10-02 DIAGNOSIS — D631 Anemia in chronic kidney disease: Secondary | ICD-10-CM | POA: Diagnosis not present

## 2021-10-02 DIAGNOSIS — N2581 Secondary hyperparathyroidism of renal origin: Secondary | ICD-10-CM | POA: Diagnosis not present

## 2021-10-02 DIAGNOSIS — N186 End stage renal disease: Secondary | ICD-10-CM | POA: Diagnosis not present

## 2021-10-02 DIAGNOSIS — Z992 Dependence on renal dialysis: Secondary | ICD-10-CM | POA: Diagnosis not present

## 2021-10-04 ENCOUNTER — Encounter (INDEPENDENT_AMBULATORY_CARE_PROVIDER_SITE_OTHER): Payer: Self-pay | Admitting: Ophthalmology

## 2021-10-04 ENCOUNTER — Ambulatory Visit (INDEPENDENT_AMBULATORY_CARE_PROVIDER_SITE_OTHER): Payer: Medicare Other | Admitting: Ophthalmology

## 2021-10-04 DIAGNOSIS — H34831 Tributary (branch) retinal vein occlusion, right eye, with macular edema: Secondary | ICD-10-CM

## 2021-10-04 DIAGNOSIS — H4052X2 Glaucoma secondary to other eye disorders, left eye, moderate stage: Secondary | ICD-10-CM | POA: Diagnosis not present

## 2021-10-04 DIAGNOSIS — H33332 Multiple defects of retina without detachment, left eye: Secondary | ICD-10-CM

## 2021-10-04 DIAGNOSIS — H34812 Central retinal vein occlusion, left eye, with macular edema: Secondary | ICD-10-CM | POA: Diagnosis not present

## 2021-10-04 DIAGNOSIS — H35412 Lattice degeneration of retina, left eye: Secondary | ICD-10-CM

## 2021-10-04 DIAGNOSIS — H43813 Vitreous degeneration, bilateral: Secondary | ICD-10-CM

## 2021-10-04 DIAGNOSIS — H1820 Unspecified corneal edema: Secondary | ICD-10-CM

## 2021-10-04 DIAGNOSIS — H209 Unspecified iridocyclitis: Secondary | ICD-10-CM | POA: Diagnosis not present

## 2021-10-04 DIAGNOSIS — H25813 Combined forms of age-related cataract, bilateral: Secondary | ICD-10-CM

## 2021-10-04 DIAGNOSIS — I10 Essential (primary) hypertension: Secondary | ICD-10-CM

## 2021-10-04 DIAGNOSIS — H35033 Hypertensive retinopathy, bilateral: Secondary | ICD-10-CM

## 2021-10-04 DIAGNOSIS — B0052 Herpesviral keratitis: Secondary | ICD-10-CM

## 2021-10-05 DIAGNOSIS — N2581 Secondary hyperparathyroidism of renal origin: Secondary | ICD-10-CM | POA: Diagnosis not present

## 2021-10-05 DIAGNOSIS — Z992 Dependence on renal dialysis: Secondary | ICD-10-CM | POA: Diagnosis not present

## 2021-10-05 DIAGNOSIS — N186 End stage renal disease: Secondary | ICD-10-CM | POA: Diagnosis not present

## 2021-10-05 DIAGNOSIS — D631 Anemia in chronic kidney disease: Secondary | ICD-10-CM | POA: Diagnosis not present

## 2021-10-06 ENCOUNTER — Encounter (INDEPENDENT_AMBULATORY_CARE_PROVIDER_SITE_OTHER): Payer: Self-pay | Admitting: Ophthalmology

## 2021-10-07 DIAGNOSIS — Z992 Dependence on renal dialysis: Secondary | ICD-10-CM | POA: Diagnosis not present

## 2021-10-07 DIAGNOSIS — N2581 Secondary hyperparathyroidism of renal origin: Secondary | ICD-10-CM | POA: Diagnosis not present

## 2021-10-07 DIAGNOSIS — N186 End stage renal disease: Secondary | ICD-10-CM | POA: Diagnosis not present

## 2021-10-07 DIAGNOSIS — D631 Anemia in chronic kidney disease: Secondary | ICD-10-CM | POA: Diagnosis not present

## 2021-10-11 DIAGNOSIS — D631 Anemia in chronic kidney disease: Secondary | ICD-10-CM | POA: Diagnosis not present

## 2021-10-11 DIAGNOSIS — N186 End stage renal disease: Secondary | ICD-10-CM | POA: Diagnosis not present

## 2021-10-11 DIAGNOSIS — Z992 Dependence on renal dialysis: Secondary | ICD-10-CM | POA: Diagnosis not present

## 2021-10-11 DIAGNOSIS — N2581 Secondary hyperparathyroidism of renal origin: Secondary | ICD-10-CM | POA: Diagnosis not present

## 2021-10-14 DIAGNOSIS — D631 Anemia in chronic kidney disease: Secondary | ICD-10-CM | POA: Diagnosis not present

## 2021-10-14 DIAGNOSIS — Z992 Dependence on renal dialysis: Secondary | ICD-10-CM | POA: Diagnosis not present

## 2021-10-14 DIAGNOSIS — N2581 Secondary hyperparathyroidism of renal origin: Secondary | ICD-10-CM | POA: Diagnosis not present

## 2021-10-14 DIAGNOSIS — N186 End stage renal disease: Secondary | ICD-10-CM | POA: Diagnosis not present

## 2021-10-16 DIAGNOSIS — D631 Anemia in chronic kidney disease: Secondary | ICD-10-CM | POA: Diagnosis not present

## 2021-10-16 DIAGNOSIS — N2581 Secondary hyperparathyroidism of renal origin: Secondary | ICD-10-CM | POA: Diagnosis not present

## 2021-10-16 DIAGNOSIS — N186 End stage renal disease: Secondary | ICD-10-CM | POA: Diagnosis not present

## 2021-10-16 DIAGNOSIS — Z992 Dependence on renal dialysis: Secondary | ICD-10-CM | POA: Diagnosis not present

## 2021-10-19 DIAGNOSIS — Z992 Dependence on renal dialysis: Secondary | ICD-10-CM | POA: Diagnosis not present

## 2021-10-19 DIAGNOSIS — N2581 Secondary hyperparathyroidism of renal origin: Secondary | ICD-10-CM | POA: Diagnosis not present

## 2021-10-19 DIAGNOSIS — N186 End stage renal disease: Secondary | ICD-10-CM | POA: Diagnosis not present

## 2021-10-19 DIAGNOSIS — D631 Anemia in chronic kidney disease: Secondary | ICD-10-CM | POA: Diagnosis not present

## 2021-10-19 NOTE — Progress Notes (Signed)
Cardiology Office Note:    Date:  07/15/2022   ID:  Michael Humphrey., DOB [birth year], [name] [id number]  PCP:  [name], MD (Inactive)   [facility] Providers Cardiologist:  Lenna Sciara, MD Referring MD: [name], MD   Chief Complaint/Reason for Referral: Cardiology follow-up  PATIENT RESCHEDULED   ASSESSMENT:    1. NICM (nonischemic cardiomyopathy) ([place])   2. Hypertension, unspecified type   3. Mixed hyperlipidemia     PLAN:    In order of problems listed above: 1.  Nonischemic cardiomyopathy: We are unable to uptitrate his medical therapy due to low prior blood pressures during dialysis.  So for now we will continue losartan 12.5 mg at bedtime and Toprol XL 25 mg at bedtime.  He could not tolerate spironolactone due to hyperkalemia.  He is not a candidate for an SGLT2 inhibitor.  I do not think Delene Loll would be a good medication as he can hardly tolerate losartan 12.5 mg at bedtime.  Given his history of end-stage renal disease I do not think he would be a candidate for heart transplantation.  He is relatively young patient with severe biventricular dysfunction so I am quite concerned about his prognosis.  I will refer the patient to advanced heart failure for further recommendations. 2.  Hypertension: 3.  Hyperlipidemia: Continue Crestor; this is being followed by the patient's PCP.             History of Present Illness:    FOCUSED PROBLEM LIST:   1.  Nonischemic cardiomyopathy with ejection fraction of 20%; not a candidate for an ICD due to infection risk from indwelling hemodialysis catheter 2.  End-stage renal disease on hemodialysis 3.  Atrial flutter status post ablation; persistent atrial fibrillation on anticoagulation 4.  Sustained ventricular tachycardia 2015 coronary angiography demonstrated no coronary artery disease 5.  Hypertension  The patient is a 66 y.o. male with the indicated medical history here for follow-up.  December 2022: The  patient was started on losartan 25 mg at night, spironolactone 25 mg at night, Toprol 25 mg at night, and no SGLT2 inhibitor was pursued due to end-stage renal disease.  Aspirin was stopped as the patient was on Eliquis.  Today: In the interim since the last appointment his losartan was decreased to 12.5 mg and the spironolactone was stopped due to low blood pressures during dialysis.  He was admitted in April due to metabolic encephalopathy thought to be due to uremia.      Current Medications: No outpatient medications have been marked as taking for the 10/22/21 encounter (Office Visit) with Early Osmond, MD.     Allergies:    Colchicine, Indapamide, Lipitor [atorvastatin], Viagra [sildenafil], and Zyloprim [allopurinol]   Social History:   Social History   Tobacco Use   Smoking status: Never   Smokeless tobacco: Never  Vaping Use   Vaping Use: Never used  Substance Use Topics   Alcohol use: No    Alcohol/week: 0.0 standard drinks of alcohol   Drug use: No     Family Hx: Family History  Problem Relation Age of Onset   Heart disease Mother    Hyperlipidemia Mother    Hypertension Mother    Kidney disease Father    Stroke Father    Kidney disease Brother    Amblyopia Neg Hx    Blindness Neg Hx    Cataracts Neg Hx    Diabetes Neg Hx    Glaucoma Neg Hx    Macular degeneration  Neg Hx    Retinal detachment Neg Hx    Strabismus Neg Hx    Retinitis pigmentosa Neg Hx    Breast cancer Neg Hx    Prostate cancer Neg Hx    Colon cancer Neg Hx    Pancreatic cancer Neg Hx      Review of Systems:   Please see the history of present illness.    All other systems reviewed and are negative.     EKGs/Labs/Other Test Reviewed:    EKG:    Prior CV studies:  TTE 2023 demonstrates ejection fraction less than 20% with severe LV dilatation severe RV dysfunction with an estimated right ventricular systolic pressure of 54 mmHg  Event monitor 2021 with normal sinus rhythm  with sinus bradycardia, paroxysmal atrial fibrillation without pulmonary masses  Coronary angiography 2015 with widely patent coronary arteries  Other studies Reviewed: Review of the additional studies/records demonstrates: None relevant  Recent Labs: 08/11/2021: B Natriuretic Peptide 2,709.7 08/14/2021: ALT 6 08/16/2021: BUN 22; Creatinine, Ser 10.10; Potassium 4.2; Sodium 130 08/25/2021: Hemoglobin 11.3; Platelet Count 115   Recent Lipid Panel Lab Results  Component Value Date/Time   TRIG 105 06/01/2019 04:18 AM    Risk Assessment/Calculations:          Physical Exam:      Signed, Early Osmond, MD  07/15/2022 9:00 AM    [facility] [place], [place], [place]  [address] Phone: [phone]; Fax: [phone]   Note:  This document was prepared using Dragon voice recognition software and may include unintentional dictation errors.

## 2021-10-21 DIAGNOSIS — Z992 Dependence on renal dialysis: Secondary | ICD-10-CM | POA: Diagnosis not present

## 2021-10-21 DIAGNOSIS — N186 End stage renal disease: Secondary | ICD-10-CM | POA: Diagnosis not present

## 2021-10-21 DIAGNOSIS — D631 Anemia in chronic kidney disease: Secondary | ICD-10-CM | POA: Diagnosis not present

## 2021-10-21 DIAGNOSIS — N2581 Secondary hyperparathyroidism of renal origin: Secondary | ICD-10-CM | POA: Diagnosis not present

## 2021-10-22 ENCOUNTER — Ambulatory Visit (INDEPENDENT_AMBULATORY_CARE_PROVIDER_SITE_OTHER): Payer: Medicare Other | Admitting: Internal Medicine

## 2021-10-22 DIAGNOSIS — I428 Other cardiomyopathies: Secondary | ICD-10-CM

## 2021-10-22 DIAGNOSIS — I10 Essential (primary) hypertension: Secondary | ICD-10-CM

## 2021-10-22 DIAGNOSIS — E782 Mixed hyperlipidemia: Secondary | ICD-10-CM

## 2021-10-22 NOTE — Progress Notes (Addendum)
Cardiology Office Note:    Date:  10/25/2021   ID:  Michael Passe., DOB Jun 14, 1955, [name] [id number]  PCP:  Elias Else, MD   [facility] Providers Cardiologist:  Alverda Skeans, MD Referring MD: Elias Else, MD   Chief Complaint/Reason for Referral:  Cardiology follow up  ASSESSMENT:    1. NICM (nonischemic cardiomyopathy) ([facility])   2. Hypertension, unspecified type   3. Mixed hyperlipidemia   4. Atrial fibrillation, unspecified type ([facility])   5. End stage renal disease ([facility])     PLAN:    In order of problems listed above: 1.  Nonischemic cardiomyopathy: The patient is doing very well.  He is on losartan, Toprol, and spironolactone and is well afterload reduced.  He is tolerating these medications.  We could consider changing to [facility] in the future but he did have issues with low blood pressures in the past.  We will have him follow-up in 9 months or earlier if needed.  He is not a candidate for an SGLT2 inhibitor.   2.  Hypertension: His blood pressure is under very good control.  Continue current medications. 3.  Hyperlipidemia: Continue Crestor; this is being followed by the patient's PCP. 4.  Atrial fibrillation: Continue Toprol and Eliquis. 5.  End-stage renal disease: He is not a candidate for an SGLT2 inhibitor.    Dispo:  Return in about 9 months (around 07/26/2022).      Medication Adjustments/Labs and Tests Ordered: Current medicines are reviewed at length with the patient today.  Concerns regarding medicines are outlined above.  The following changes have been made:  no change   Labs/tests ordered: No orders of the defined types were placed in this encounter.   Medication Changes: No orders of the defined types were placed in this encounter.    Current medicines are reviewed at length with the patient today.  The patient does not have concerns regarding medicines.   History of Present Illness:    FOCUSED PROBLEM LIST:   1.  Nonischemic  cardiomyopathy with ejection fraction of 20%; not a candidate for an ICD due to infection risk from indwelling hemodialysis catheter 2.  End-stage renal disease on hemodialysis ([facility]) 3.  Atrial flutter status post ablation; persistent atrial fibrillation on anticoagulation CV2 score 4 4.  Sustained ventricular tachycardia 2015; coronary angiography demonstrated no coronary artery disease 5.  Hypertension  The patient is a 66 y.o. male with the indicated medical history here for follow-up.   December 2022: The patient was started on losartan 25 mg at night, spironolactone 25 mg at night, Toprol 25 mg at night, and no SGLT2 inhibitor was pursued due to end-stage renal disease.  Aspirin was stopped as the patient was on Eliquis.   Today: He was admitted in April due to metabolic encephalopathy thought to be due to uremia.  He is doing fairly well.  He notes that he is somewhat short of breath on the days that he is supposed to get dialysis for example on Tuesday morning.  After dialysis he feels very well.  He denies any exertional angina, exertional dyspnea, presyncope, or syncope.  He has had no severe bleeding or bruising while on anticoagulation.  He is otherwise well and without significant complaints today.    Current Medications: Current Meds  Medication Sig   apixaban (ELIQUIS) 5 MG TABS tablet Take 1 tablet by mouth twice daily (Patient taking differently: Take 5 mg by mouth daily.)   atropine 1 % ophthalmic solution Place 1 drop  into the left eye in the morning and at bedtime.   brimonidine (ALPHAGAN) 0.2 % ophthalmic solution Place 1 drop into the left eye 3 (three) times daily.   cinacalcet (SENSIPAR) 30 MG tablet Take 30 mg by mouth daily.   clotrimazole-betamethasone (LOTRISONE) cream Apply 1 application topically 2 (two) times daily.   dorzolamide-timolol (COSOPT) 22.3-6.8 MG/ML ophthalmic solution Place 1 drop into the left eye 3 (three) times daily.   losartan (COZAAR) 25 MG  tablet Take 25 mg by mouth daily.   Loteprednol Etabonate (LOTEMAX SM) 0.38 % GEL Place 1 drop into the left eye every 2 (two) hours.   metoprolol succinate (TOPROL XL) 25 MG 24 hr tablet Take 1 tablet (25 mg total) by mouth at bedtime.   nitroGLYCERIN (NITROSTAT) 0.4 MG SL tablet Place 1 tablet (0.4 mg total) under the tongue every 5 (five) minutes x 3 doses as needed for chest pain.   rosuvastatin (CRESTOR) 20 MG tablet Take 20 mg by mouth daily.   sevelamer carbonate (RENVELA) 800 MG tablet Take 1,600 mg by mouth 3 (three) times daily with meals.   sodium chloride (MURO 128) 2 % ophthalmic solution Place 1 drop into the left eye 4 (four) times daily.   spironolactone (ALDACTONE) 25 MG tablet Take 25 mg by mouth daily.   [DISCONTINUED] losartan (COZAAR) 25 MG tablet Take 0.5 tablets (12.5 mg total) by mouth daily. (Patient taking differently: Take 25 mg by mouth daily.)     Allergies:    Colchicine, Indapamide, Lipitor [atorvastatin], Viagra [sildenafil], and Zyloprim [allopurinol]   Social History:   Social History   Tobacco Use   Smoking status: Never   Smokeless tobacco: Never  Vaping Use   Vaping Use: Never used  Substance Use Topics   Alcohol use: No    Alcohol/week: 0.0 standard drinks of alcohol   Drug use: No     Family Hx: Family History  Problem Relation Age of Onset   Heart disease Mother    Hyperlipidemia Mother    Hypertension Mother    Kidney disease Father    Stroke Father    Kidney disease Brother    Amblyopia Neg Hx    Blindness Neg Hx    Cataracts Neg Hx    Diabetes Neg Hx    Glaucoma Neg Hx    Macular degeneration Neg Hx    Retinal detachment Neg Hx    Strabismus Neg Hx    Retinitis pigmentosa Neg Hx    Breast cancer Neg Hx    Prostate cancer Neg Hx    Colon cancer Neg Hx    Pancreatic cancer Neg Hx      Review of Systems:   Please see the history of present illness.    All other systems reviewed and are negative.     EKGs/Labs/Other Test  Reviewed:    EKG:  EKG performed April 2023 that I personally reviewed demonstrates atrial fibrillation with right bundle branch block.  Prior CV studies: TTE 2023 demonstrates ejection fraction less than 20% with severe LV dilatation severe RV dysfunction with an estimated right ventricular systolic pressure of 54 mmHg   Event monitor 2021 with normal sinus rhythm with sinus bradycardia, paroxysmal atrial fibrillation without pulmonary masses   Coronary angiography 2015 with widely patent coronary arteries   Other studies Reviewed: Review of the additional studies/records demonstrates: None relevant  Recent Labs: 08/11/2021: B Natriuretic Peptide 2,709.7 08/14/2021: ALT 6 08/16/2021: BUN 22; Creatinine, Ser 10.10; Potassium 4.2; Sodium 130 08/25/2021:  Hemoglobin 11.3; Platelet Count 115   Recent Lipid Panel Lab Results  Component Value Date/Time   TRIG 105 06/01/2019 04:18 AM    Risk Assessment/Calculations:     CHA2DS2-VASc Score = 4   This indicates a 4.8% annual risk of stroke. The patient's score is based upon: CHF History: 1 HTN History: 1 Diabetes History: 0 Stroke History: 2 Vascular Disease History: 0 Age Score: 0 Gender Score: 0         Physical Exam:    VS:  BP 110/70   Pulse (!) 59   Ht 5\' 7"  (1.702 m)   Wt 189 lb 9.6 oz (86 kg)   SpO2 98%   BMI 29.70 kg/m    Wt Readings from Last 3 Encounters:  10/25/21 189 lb 9.6 oz (86 kg)  08/25/21 192 lb 4.8 oz (87.2 kg)  08/14/21 188 lb 7.9 oz (85.5 kg)    GENERAL:  No apparent distress, AOx3 HEENT:  No carotid bruits, +2 carotid impulses, no scleral icterus CAR: RRR no murmurs, gallops, rubs, or thrills; hemodialysis catheter in right upper chest RES:  Clear to auscultation bilaterally ABD:  Soft, nontender, nondistended, positive bowel sounds x 4 VASC:  +2 radial pulses, +2 carotid pulses, palpable pedal pulses NEURO:  CN 2-12 grossly intact; motor and sensory grossly intact PSYCH:  No active depression  or anxiety EXT:  +1 edema without ecchymosis or cyanosis  Signed, Orbie Pyo, MD  10/25/2021 8:49 AM    [facility] [address] [place], [place], [place]  [address] Phone: [phone]; Fax: [phone]   Note:  This document was prepared using Dragon voice recognition software and may include unintentional dictation errors.

## 2021-10-23 DIAGNOSIS — N2581 Secondary hyperparathyroidism of renal origin: Secondary | ICD-10-CM | POA: Diagnosis not present

## 2021-10-23 DIAGNOSIS — D631 Anemia in chronic kidney disease: Secondary | ICD-10-CM | POA: Diagnosis not present

## 2021-10-23 DIAGNOSIS — N186 End stage renal disease: Secondary | ICD-10-CM | POA: Diagnosis not present

## 2021-10-23 DIAGNOSIS — Z992 Dependence on renal dialysis: Secondary | ICD-10-CM | POA: Diagnosis not present

## 2021-10-25 ENCOUNTER — Encounter: Payer: Self-pay | Admitting: Internal Medicine

## 2021-10-25 ENCOUNTER — Ambulatory Visit (INDEPENDENT_AMBULATORY_CARE_PROVIDER_SITE_OTHER): Payer: Medicare Other | Admitting: Internal Medicine

## 2021-10-25 VITALS — BP 110/70 | HR 59 | Ht 67.0 in | Wt 189.6 lb

## 2021-10-25 DIAGNOSIS — I4891 Unspecified atrial fibrillation: Secondary | ICD-10-CM | POA: Diagnosis not present

## 2021-10-25 DIAGNOSIS — E782 Mixed hyperlipidemia: Secondary | ICD-10-CM | POA: Diagnosis not present

## 2021-10-25 DIAGNOSIS — N186 End stage renal disease: Secondary | ICD-10-CM | POA: Diagnosis not present

## 2021-10-25 DIAGNOSIS — I10 Essential (primary) hypertension: Secondary | ICD-10-CM | POA: Diagnosis not present

## 2021-10-25 DIAGNOSIS — I428 Other cardiomyopathies: Secondary | ICD-10-CM

## 2021-10-26 DIAGNOSIS — Z992 Dependence on renal dialysis: Secondary | ICD-10-CM | POA: Diagnosis not present

## 2021-10-26 DIAGNOSIS — N186 End stage renal disease: Secondary | ICD-10-CM | POA: Diagnosis not present

## 2021-10-26 DIAGNOSIS — N2581 Secondary hyperparathyroidism of renal origin: Secondary | ICD-10-CM | POA: Diagnosis not present

## 2021-10-26 DIAGNOSIS — D631 Anemia in chronic kidney disease: Secondary | ICD-10-CM | POA: Diagnosis not present

## 2021-10-28 DIAGNOSIS — N186 End stage renal disease: Secondary | ICD-10-CM | POA: Diagnosis not present

## 2021-10-28 DIAGNOSIS — D631 Anemia in chronic kidney disease: Secondary | ICD-10-CM | POA: Diagnosis not present

## 2021-10-28 DIAGNOSIS — Z992 Dependence on renal dialysis: Secondary | ICD-10-CM | POA: Diagnosis not present

## 2021-10-28 DIAGNOSIS — N2581 Secondary hyperparathyroidism of renal origin: Secondary | ICD-10-CM | POA: Diagnosis not present

## 2021-10-28 NOTE — Progress Notes (Signed)
[facility] Note  11/01/2021    CHIEF COMPLAINT Patient presents for Retina Follow Up   HISTORY OF PRESENT ILLNESS: Michael Klinger. is a 67 y.o. male who presents to the clinic today for:   HPI     Retina Follow Up   Patient presents with  CRVO/BRVO.  In left eye.  This started 4 weeks ago.  I, the attending physician,  performed the HPI with the patient and updated documentation appropriately.        Comments   Patient here for 4 weeks retina follow up for CRVO OS. Patient states vision getting better and better. OS had glaucoma sx. Using drops blue top BID OS and Pink top TID OS.       Last edited by [name], MD on 11/03/2021 12:53 PM.    Patient will return to [facility] 11/11/2021.  Referring physician: Merdis Delay, MD [place],  [place] [phone]  HISTORICAL INFORMATION:   Selected notes from the MEDICAL RECORD NUMBER Referred by Dr. [name]. Hecker for concern of HRVO OD; LEE- 11.30.18 ([name]) {BCVA OD: 20/100-1 OS: 20/20-2] Ocular Hx- cataract OU PMH- HTN, high chol, kidney disease, sleep apnea, emphysema    CURRENT MEDICATIONS: Current Outpatient Medications (Ophthalmic Drugs)  Medication Sig   atropine 1 % ophthalmic solution Place 1 drop into the left eye in the morning and at bedtime.   brimonidine (ALPHAGAN) 0.2 % ophthalmic solution Place 1 drop into the left eye 3 (three) times daily.   dorzolamide-timolol (COSOPT) 22.3-6.8 MG/ML ophthalmic solution Place 1 drop into the left eye 3 (three) times daily.   Loteprednol Etabonate (LOTEMAX SM) 0.38 % GEL Place 1 drop into the left eye every 2 (two) hours.   sodium chloride (MURO 128) 2 % ophthalmic solution Place 1 drop into the left eye 4 (four) times daily.   No current facility-administered medications for this visit. (Ophthalmic Drugs)   Current Outpatient Medications (Other)  Medication Sig   apixaban (ELIQUIS) 5 MG TABS tablet Take 1 tablet by mouth twice daily  (Patient taking differently: Take 5 mg by mouth daily.)   cinacalcet (SENSIPAR) 30 MG tablet Take 30 mg by mouth daily.   clotrimazole-betamethasone (LOTRISONE) cream Apply 1 application topically 2 (two) times daily.   losartan (COZAAR) 25 MG tablet Take 25 mg by mouth daily.   metoprolol succinate (TOPROL XL) 25 MG 24 hr tablet Take 1 tablet (25 mg total) by mouth at bedtime.   nitroGLYCERIN (NITROSTAT) 0.4 MG SL tablet Place 1 tablet (0.4 mg total) under the tongue every 5 (five) minutes x 3 doses as needed for chest pain.   rosuvastatin (CRESTOR) 20 MG tablet Take 20 mg by mouth daily.   sevelamer carbonate (RENVELA) 800 MG tablet Take 1,600 mg by mouth 3 (three) times daily with meals.   spironolactone (ALDACTONE) 25 MG tablet Take 25 mg by mouth daily.   No current facility-administered medications for this visit. (Other)   REVIEW OF SYSTEMS: ROS   Positive for: Genitourinary, Cardiovascular, Eyes, Respiratory Negative for: Constitutional, Gastrointestinal, Neurological, Skin, Musculoskeletal, HENT, Endocrine, Psychiatric, Allergic/Imm, Heme/Lymph Last edited by Theodore Demark, COA on 11/01/2021  8:53 AM.      ALLERGIES Allergies  Allergen Reactions   Colchicine Other (See Comments)    Other reaction(s): foot pain   Indapamide Other (See Comments)    unknown   Lipitor [Atorvastatin] Itching    Leg pain   Viagra [Sildenafil] Other (See Comments)  Feels weird - Levitra. - headache   Zyloprim [Allopurinol] Other (See Comments)    foot pain   PAST MEDICAL HISTORY Past Medical History:  Diagnosis Date   Atrial arrhythmia    atrial tachycardia with variable AV conduction versus atypical aflutter 01/10/19, rate control (02/06/19)   Cataract    Dyspnea    on exertion   ESRD (end stage renal disease) ([place])    Hemo- MWF, Polycystic kidney disease   Fatigue    History of kidney stones    removal of stone- cysto   Hyperlipidemia    Hyperparathyroidism, secondary renal ([place])     Hypertension    Hypoxemia 12/12/2013   Nonischemic cardiomyopathy ([place])    Er 25% 2015, 55 % 2013   OSA on CPAP    no longer using cpap   OSA on CPAP 03/24/2014   Pneumonia    2015ish   Prostate cancer ([place])    Ventricular tachycardia//Freq PVCs    Wears glasses    Past Surgical History:  Procedure Laterality Date   A-FLUTTER ABLATION N/A 04/11/2019   Procedure: A-FLUTTER ABLATION;  Surgeon: Evans Lance, MD;  Location: [place] [facility];  Service: Cardiovascular;  Laterality: N/A;   A/V FISTULAGRAM Left 04/27/2017   Procedure: A/V FISTULAGRAM;  Surgeon: Conrad Hayfield, MD;  Location: [place] [facility];  Service: Cardiovascular;  Laterality: Left;  lt arm   A/V FISTULAGRAM Left 01/10/2019   Procedure: A/V FISTULAGRAM;  Surgeon: Marty Heck, MD;  Location: [place] [facility];  Service: Cardiovascular;  Laterality: Left;   APPENDECTOMY     AV FISTULA PLACEMENT  12/05/2011   Procedure: ARTERIOVENOUS (AV) FISTULA CREATION;LLEFT ARM  Surgeon: Conrad Tolani Lake, MD;  Location: [place];  Service: Vascular;  Laterality: Left;  RADIO-CEPHALIC  fistula left arm   AV FISTULA PLACEMENT  01/11/2012   Procedure: ARTERIOVENOUS (AV) FISTULA CREATION;  Surgeon: Conrad Marlette, MD;  Location: [place];  Service: Vascular;  Laterality: Left;  Creation of left brachial cephalic arteriovenous fistula   AV FISTULA PLACEMENT Right 03/07/2019   Procedure: ARTERIOVENOUS (AV) FISTULA CREATION  RIGHT ARM;  Surgeon: Marty Heck, MD;  Location: [place];  Service: Vascular;  Laterality: Right;   [place] Left 12/27/2016   Procedure: BASILIC VEIN TRANSPOSITION LEFT UPPER ARM FIRST STAGE;  Surgeon: Conrad Prague, MD;  Location: [place];  Service: Vascular;  Laterality: Left;   [place] Left 01/31/2017   Procedure: LEFT ARM BASILIC VEIN TRANSPOSITION, SECOND STAGE;  Surgeon: Conrad Boyd, MD;  Location: [place];  Service: Vascular;  Laterality: Left;   CARDIAC CATHETERIZATION   04-05-2010   checking for blockage but none-[facility]   COLONOSCOPY     CYSTOSCOPY W/ STONE MANIPULATION     "laser once" (01/22/2013)   HEMATOMA EVACUATION Left 05/09/2017   Procedure: EVACUATION HEMATOMA LEFT ARM;  Surgeon: Conrad Hull, MD;  Location: [place];  Service: Vascular;  Laterality: Left;   HERNIA REPAIR     INGUINAL HERNIA REPAIR Right 11/06/2015   Procedure: OPEN HERNIA REPAIR  RIGHT INGUINAL ADULT;  Surgeon: [name], MD;  Location: [facility];  Service: General;  Laterality: Right;  with MESH   INSERTION OF DIALYSIS CATHETER Right 10/05/2016   Procedure: INSERTION OF right internal jugular DIALYSIS CATHETER;  Surgeon: [name], MD;  Location: [place];  Service: Vascular;  Laterality: Right;   INSERTION OF MESH  03/20/2012   Procedure: INSERTION OF MESH;  Danella Maiers  Surgeon: [name], MD;  Location: [place];  Service: General;  Laterality: N/A;   INSERTION OF MESH N/A 01/22/2013   Procedure: INSERTION OF MESH;  Surgeon: [name], MD;  Location: [place];  Service: General;  Laterality: N/A;   LAPAROTOMY  04/02/2012   Procedure: EXPLORATORY LAPAROTOMY;  Surgeon: [name], MD;  Location: [place];  Service: General;  Laterality: N/A;  Exploratory Laparotomy with resection of small intestine   LEFT HEART CATHETERIZATION WITH CORONARY ANGIOGRAM N/A 05/14/2013   Procedure: LEFT HEART CATHETERIZATION WITH CORONARY ANGIOGRAM;  Surgeon: Sinclair Grooms, MD;  [facility];  Service: Cardiovascular;  Laterality: N/A;   LIGATION OF ARTERIOVENOUS  FISTULA Left 12/27/2016   Procedure: LIGATION/EXCISION OF LEFT UPPER ARM ARTERIOVENOUS  FISTULA;  EVACUATION OF HEMATOMA;  Surgeon: Conrad Oskaloosa, MD;  Location: [place];  Service: Vascular;  Laterality: Left;   LIGATION OF ARTERIOVENOUS  FISTULA Left 03/07/2019   Procedure: LIGATION OF ARTERIOVENOUS FISTULA  LEFT UPPER ARM;  Surgeon: Marty Heck, MD;  Location: [place];  Service: Vascular;  Laterality: Left;   REVISON OF  ARTERIOVENOUS FISTULA Left 10/05/2016   Procedure: REVISON OF left arm ARTERIOVENOUS FISTULA;  Surgeon: [name], MD;  Location: [place];  Service: Vascular;  Laterality: Left;   TONSILLECTOMY     UMBILICAL HERNIA REPAIR  03/20/2012   Procedure: HERNIA REPAIR UMBILICAL ADULT;  Surgeon: [name], MD;  Location: [place];  Service: General;  Laterality: N/A;   UMBILICAL HERNIA REPAIR  01/22/2013   preperitoneal open procedure due to significant adhesions/notes 01/22/2013   VENTRAL HERNIA REPAIR N/A 01/22/2013   Procedure: ATTEMPTED LAPAROSCOPIC VENTRAL HERNIA CONVERTED TO OPEN;  Surgeon: [name], MD;  [facility];  Service: General;  Laterality: N/A;   FAMILY HISTORY Family History  Problem Relation Age of Onset   Heart disease Mother    Hyperlipidemia Mother    Hypertension Mother    Kidney disease Father    Stroke Father    Kidney disease Brother    Amblyopia Neg Hx    Blindness Neg Hx    Cataracts Neg Hx    Diabetes Neg Hx    Glaucoma Neg Hx    Macular degeneration Neg Hx    Retinal detachment Neg Hx    Strabismus Neg Hx    Retinitis pigmentosa Neg Hx    Breast cancer Neg Hx    Prostate cancer Neg Hx    Colon cancer Neg Hx    Pancreatic cancer Neg Hx    SOCIAL HISTORY Social History   Tobacco Use   Smoking status: Never   Smokeless tobacco: Never  Vaping Use   Vaping Use: Never used  Substance Use Topics   Alcohol use: No    Alcohol/week: 0.0 standard drinks of alcohol   Drug use: No       OPHTHALMIC EXAM:  Base Eye Exam     Visual Acuity (Snellen - Linear)       Right Left   Dist cc 20/25 -2 20/200   Dist ph cc NI NI    Correction: Glasses         Tonometry (Tonopen, 8:49 AM)       Right Left   Pressure 22 19         Pupils       Dark Light Shape React APD   Right 3 2 Round Minimal None   Left 4 3 Round Minimal None  Visual Fields (Counting fingers)       Left Right    Full Full         Extraocular  Movement       Right Left    Full, Ortho Full, Ortho         Neuro/Psych     Oriented x3: Yes   Mood/Affect: Normal         Dilation     Both eyes: 1.0% Mydriacyl, 2.5% Phenylephrine @ 8:49 AM           Slit Lamp and Fundus Exam     Slit Lamp Exam       Right Left   Lids/Lashes Dermatochalasis - upper lid Dermatochalasis - upper lid   Conjunctiva/Sclera Mild Melanosis, Nasal Pinguecula, trace Injection Mild Melanosis, ahmed valve ST quad with mild bleb overlying, patch graft in good position, nasal and temporal Pinguecula   Cornea Arcus, trace Punctate epithelial erosions, mild corneal haze arcus, fine endo pigment, 1+ fine PEE   Anterior Chamber Deep and quiet Deep, narrow angles, tube at 1230   Iris Round and dilated, No NVI Round and dilated, +NVI IN pupil margin -- stably regressed, ?mild PS superiorly   Lens 2-3+ Nuclear sclerosis, 2-3+ Cortical cataract 3+ Nuclear sclerosis, 3+ Cortical cataract, mild pigment deposition on anterior capsule   Anterior Vitreous Vitreous syneresis, +Posterior vitreous detachment, trace cell syneresis         Fundus Exam       Right Left   Disc mild Pallor, Sharp rim mild Pallor, +heme from 0730-0900 -- improved, attenuated vessels, mild vascular loops IN quad   C/D Ratio 0.5 0.5   Macula Flat, Blunted foveal reflex, Drusen, RPE mottling, No heme or edema hazy view -- improved, good foveal reflex, cluster of DBH ST mac -- improving   Vessels attenuated, Copper wiring, Tortuous, peripheral vascular attenuation attenuated, Tortuous, +CRVO   Periphery Attached, pigmented Chorioretinal scar at 1030, scattered DBH inferior and temporal quads Attached, pigmented lattice with 3 atrophic holes at 1030 ora - good laser surrounding, 360 DBH           Refraction     Wearing Rx       Sphere Cylinder Axis Add   Right +0.50 +0.50 168 +2.75   Left +0.50 +0.50 102 +2.75    Type: Bifocal           IMAGING AND PROCEDURES   Imaging and Procedures for 08/31/17  OCT, Retina - OU - Both Eyes       Right Eye Quality was good. Central Foveal Thickness: 236. Progression has been stable. Findings include normal foveal contour, no IRF, no SRF, inner retinal atrophy, vitreomacular adhesion (Inner retinal atrophy IT quad caught on widefield - stable).   Left Eye Quality was good. Central Foveal Thickness: 240. Progression has improved. Findings include normal foveal contour, no IRF, no SRF, subretinal hyper-reflective material, intraretinal hyper-reflective material, inner retinal atrophy, outer retinal atrophy (Focal SRHM with surrounding shallow SRF ST macula seen best on widefield -- improved, central ellipsoid loss).   Notes *Images captured and stored on drive  Diagnosis / Impression:  OD: NFP, No IRF/SRF; Inner retinal atrophy IT quad caught on widefield - stable OS: Focal SRHM with surrounding shallow SRF ST macula seen best on widefield -- improved, central ellipsoid loss  Clinical management:  See below  Abbreviations: NFP - Normal foveal profile. CME - cystoid macular edema. PED - pigment epithelial detachment. IRF - intraretinal fluid. SRF -  subretinal fluid. EZ - ellipsoid zone. ERM - epiretinal membrane. ORA - outer retinal atrophy. ORT - outer retinal tubulation. SRHM - subretinal hyper-reflective material                ASSESSMENT/PLAN:    ICD-10-CM   1. Central retinal vein occlusion with macular edema of left eye  H34.8120 OCT, Retina - OU - Both Eyes    2. Essential hypertension  I10     3. Hypertensive retinopathy of both eyes  H35.033     4. Herpes keratitis  B00.52     5. Anterior uveitis  H20.9     6. Branch retinal vein occlusion of right eye with macular edema  H34.8310     7. Lattice degeneration of left retina  H35.412     8. Atrophic retinal break, multiple, left eye  H33.332     9. Posterior vitreous detachment of both eyes  H43.813     10. Combined forms of  age-related cataract of both eyes  H25.813     11. Neovascular glaucoma of left eye, moderate stage  H40.52X2     12. Corneal edema of left eye  H18.20      1. CRVO converted from Inferior HRVO OS  - pt lost to follow up from 08.03.21 to 03.21.23 -- undergoing treatments for prostate cancer  - just prior to 9.24.20 visit, found to have a clot in fistula, left arm  - s/p IVA OS #1 (09.24.20), #2 (10.22.20), #3 (11.23.20), #4 (1.14.21), #5 (03.30.23), #6 (04.28.23)  - BCVA 200 OS -- improved from CF  - OCT OS: Focal SRHM with surrounding shallow SRF ST macula seen best on widefield -- improved, central ellipsoid loss  - exam shows +NVI stably regressed post IVA OS on 3.30.23, persistent DBH 360  - IOP improved post Ahmed valve w/ Dr. Lyndel Safe [facility])  - will hold off on IVA OS for now - Avastin informed consent obtained and re-signed, 03.30.23 - F/U 4 wks -  DFE/OCT  2,3. Hypertensive retinopathy OU - discussed importance of tight BP control - monitor  4,5. 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 Lucianne Lei (400 mg BID)  - acyclovir stopped during recent hospitalization due to possible toxicity  6. History of BRVO w/ CME OD - s/p IVA OD #1 (12.21.18), #2 (01.18.19), #3 (02.14.19), #4 (04.02.19), #5 (05.02.19)  - OCT today with no IRF/SRF despite no antiVEGF therapy since May 2019 -- stably resolved  - will continue to hold  injection  7,8. Lattice degeneration with atrophic holes OS-   - peripheral holes superiorly at 1030 without SRF or RD  - S/P laser retinopexy OS (01.04.19)  - good laser in place--stable  9. PVD / vitreous syneresis OU  - Discussed findings and prognosis  - No RT or RD on 360 scleral depressed exam  - Reviewed s/s of RT/RD  - strict return precautions for  any such RT/RD symptoms  10. Combined forms age-related cataract OU-   - The symptoms of cataract, surgical options, and treatments and risks were discussed with patient.  - discussed diagnosis and progression  - approaching visual significance  11,12. Neovascular glaucoma w/ corneal edema OS  **pt hospitalized on 04.11.23 for altered mental state**  - s/p ahmed valve OS (05.19.23, Dr. Lyndel Safe)  - corneal edema improved  - using Cosopt BID OU and Prednisolone QID OS  - off Muro-128 QID OS  - off diamox  - IOP 19 OS  - appt at [facility] in [place] 07.13.23  - f/u 4 weeks DFE, OCT  Ophthalmic Meds Ordered this visit:  No orders of the defined types were placed in this encounter.    Return in about 4 weeks (around 11/29/2021) for DFE, OCT.  There are no Patient Instructions on file for this visit.  This document serves as a record of services personally performed by Gardiner Sleeper, MD, PhD. It was created on their behalf by Roselee Nova, COMT. The creation of this record is the provider's dictation and/or activities during the visit.  Electronically signed by: Roselee Nova, COMT 11/03/21 12:54 PM  This document serves as a record of services personally performed by Gardiner Sleeper, MD, PhD. It was created on their behalf by Leonie Douglas, an ophthalmic technician. The creation of this record is the provider's dictation and/or activities during the visit.    Electronically signed by: Leonie Douglas COA, 11/03/21  12:54 PM   Gardiner Sleeper, M.D., Ph.D. Diseases & Surgery of the Retina and Vitreous [facility]  I have reviewed the above documentation for accuracy and completeness, and I agree with the above. Gardiner Sleeper, M.D., Ph.D. 11/03/21 12:56 PM   Abbreviations: M myopia (nearsighted); A astigmatism; H hyperopia (farsighted); P presbyopia; Mrx spectacle prescription;  CTL contact lenses; OD right eye; OS left eye; OU both eyes  XT exotropia; ET esotropia; PEK punctate epithelial keratitis; PEE punctate epithelial erosions; DES dry eye syndrome; MGD meibomian gland dysfunction; ATs artificial tears; PFAT's preservative free artificial tears; [place] nuclear sclerotic cataract; PSC posterior subcapsular cataract; ERM epi-retinal membrane; PVD posterior vitreous detachment; RD retinal detachment; DM diabetes mellitus; DR diabetic retinopathy; NPDR non-proliferative diabetic retinopathy; PDR proliferative diabetic retinopathy; CSME clinically significant macular edema; DME diabetic macular edema; dbh dot blot hemorrhages; CWS cotton wool spot; POAG primary open angle glaucoma; C/D cup-to-disc ratio; HVF humphrey visual field; GVF goldmann visual field; OCT optical coherence tomography; IOP intraocular pressure; BRVO Branch retinal vein occlusion; CRVO central retinal vein occlusion; CRAO central retinal artery occlusion; BRAO branch retinal artery occlusion; RT retinal tear; SB scleral buckle; PPV pars plana vitrectomy; VH Vitreous hemorrhage; PRP panretinal laser photocoagulation; IVK intravitreal kenalog; VMT vitreomacular traction; MH Macular hole;  NVD neovascularization of the disc; NVE neovascularization elsewhere; AREDS age related eye disease study; ARMD age related macular degeneration; POAG primary open angle glaucoma; EBMD epithelial/anterior basement membrane dystrophy; ACIOL anterior chamber intraocular lens; IOL intraocular lens; PCIOL posterior chamber intraocular lens; Phaco/IOL phacoemulsification with intraocular lens placement; [place] photorefractive keratectomy; LASIK laser  assisted in situ keratomileusis; HTN hypertension; DM diabetes mellitus; COPD chronic obstructive pulmonary disease

## 2021-10-30 DIAGNOSIS — Q612 Polycystic kidney, adult type: Secondary | ICD-10-CM | POA: Diagnosis not present

## 2021-10-30 DIAGNOSIS — N186 End stage renal disease: Secondary | ICD-10-CM | POA: Diagnosis not present

## 2021-10-30 DIAGNOSIS — Z992 Dependence on renal dialysis: Secondary | ICD-10-CM | POA: Diagnosis not present

## 2021-11-01 ENCOUNTER — Encounter (INDEPENDENT_AMBULATORY_CARE_PROVIDER_SITE_OTHER): Payer: Self-pay | Admitting: Ophthalmology

## 2021-11-01 ENCOUNTER — Ambulatory Visit (INDEPENDENT_AMBULATORY_CARE_PROVIDER_SITE_OTHER): Payer: Medicare Other | Admitting: Ophthalmology

## 2021-11-01 DIAGNOSIS — N186 End stage renal disease: Secondary | ICD-10-CM | POA: Diagnosis not present

## 2021-11-01 DIAGNOSIS — H33332 Multiple defects of retina without detachment, left eye: Secondary | ICD-10-CM

## 2021-11-01 DIAGNOSIS — D631 Anemia in chronic kidney disease: Secondary | ICD-10-CM | POA: Diagnosis not present

## 2021-11-01 DIAGNOSIS — H34812 Central retinal vein occlusion, left eye, with macular edema: Secondary | ICD-10-CM | POA: Diagnosis not present

## 2021-11-01 DIAGNOSIS — H43813 Vitreous degeneration, bilateral: Secondary | ICD-10-CM | POA: Diagnosis not present

## 2021-11-01 DIAGNOSIS — I10 Essential (primary) hypertension: Secondary | ICD-10-CM

## 2021-11-01 DIAGNOSIS — H1820 Unspecified corneal edema: Secondary | ICD-10-CM | POA: Diagnosis not present

## 2021-11-01 DIAGNOSIS — H4052X2 Glaucoma secondary to other eye disorders, left eye, moderate stage: Secondary | ICD-10-CM | POA: Diagnosis not present

## 2021-11-01 DIAGNOSIS — B0052 Herpesviral keratitis: Secondary | ICD-10-CM | POA: Diagnosis not present

## 2021-11-01 DIAGNOSIS — H209 Unspecified iridocyclitis: Secondary | ICD-10-CM

## 2021-11-01 DIAGNOSIS — H35033 Hypertensive retinopathy, bilateral: Secondary | ICD-10-CM | POA: Diagnosis not present

## 2021-11-01 DIAGNOSIS — H25813 Combined forms of age-related cataract, bilateral: Secondary | ICD-10-CM

## 2021-11-01 DIAGNOSIS — Z992 Dependence on renal dialysis: Secondary | ICD-10-CM | POA: Diagnosis not present

## 2021-11-01 DIAGNOSIS — H34831 Tributary (branch) retinal vein occlusion, right eye, with macular edema: Secondary | ICD-10-CM | POA: Diagnosis not present

## 2021-11-01 DIAGNOSIS — H35412 Lattice degeneration of retina, left eye: Secondary | ICD-10-CM | POA: Diagnosis not present

## 2021-11-01 DIAGNOSIS — N2581 Secondary hyperparathyroidism of renal origin: Secondary | ICD-10-CM | POA: Diagnosis not present

## 2021-11-03 ENCOUNTER — Encounter (INDEPENDENT_AMBULATORY_CARE_PROVIDER_SITE_OTHER): Payer: Self-pay | Admitting: Ophthalmology

## 2021-11-04 ENCOUNTER — Telehealth: Payer: Self-pay | Admitting: Internal Medicine

## 2021-11-04 DIAGNOSIS — D631 Anemia in chronic kidney disease: Secondary | ICD-10-CM | POA: Diagnosis not present

## 2021-11-04 DIAGNOSIS — N186 End stage renal disease: Secondary | ICD-10-CM | POA: Diagnosis not present

## 2021-11-04 DIAGNOSIS — N2581 Secondary hyperparathyroidism of renal origin: Secondary | ICD-10-CM | POA: Diagnosis not present

## 2021-11-04 DIAGNOSIS — Z992 Dependence on renal dialysis: Secondary | ICD-10-CM | POA: Diagnosis not present

## 2021-11-04 NOTE — Telephone Encounter (Signed)
[name] called back to address questions regarding heart and kidney transplant procedures.  [name] was told to reach out to nephrology, but upon further investigating discovered that is son was not in [place], but was hospitalized in [place].   Follow up with the hospital in [place] was recommended.  This was understood and [name].

## 2021-11-04 NOTE — Telephone Encounter (Signed)
Pt is calling to get advice about a heart and kidney transplant from his son who just passed away. He is wanting to know about the process of obtaining his organs due to him being on the 40% list. He is requesting a call back.

## 2021-11-09 DIAGNOSIS — N186 End stage renal disease: Secondary | ICD-10-CM | POA: Diagnosis not present

## 2021-11-09 DIAGNOSIS — Z992 Dependence on renal dialysis: Secondary | ICD-10-CM | POA: Diagnosis not present

## 2021-11-09 DIAGNOSIS — D631 Anemia in chronic kidney disease: Secondary | ICD-10-CM | POA: Diagnosis not present

## 2021-11-09 DIAGNOSIS — N2581 Secondary hyperparathyroidism of renal origin: Secondary | ICD-10-CM | POA: Diagnosis not present

## 2021-11-11 DIAGNOSIS — D631 Anemia in chronic kidney disease: Secondary | ICD-10-CM | POA: Diagnosis not present

## 2021-11-11 DIAGNOSIS — N2581 Secondary hyperparathyroidism of renal origin: Secondary | ICD-10-CM | POA: Diagnosis not present

## 2021-11-11 DIAGNOSIS — Z992 Dependence on renal dialysis: Secondary | ICD-10-CM | POA: Diagnosis not present

## 2021-11-11 DIAGNOSIS — N186 End stage renal disease: Secondary | ICD-10-CM | POA: Diagnosis not present

## 2021-11-16 DIAGNOSIS — Z992 Dependence on renal dialysis: Secondary | ICD-10-CM | POA: Diagnosis not present

## 2021-11-16 DIAGNOSIS — D631 Anemia in chronic kidney disease: Secondary | ICD-10-CM | POA: Diagnosis not present

## 2021-11-16 DIAGNOSIS — N2581 Secondary hyperparathyroidism of renal origin: Secondary | ICD-10-CM | POA: Diagnosis not present

## 2021-11-16 DIAGNOSIS — N186 End stage renal disease: Secondary | ICD-10-CM | POA: Diagnosis not present

## 2021-11-18 DIAGNOSIS — D631 Anemia in chronic kidney disease: Secondary | ICD-10-CM | POA: Diagnosis not present

## 2021-11-18 DIAGNOSIS — N2581 Secondary hyperparathyroidism of renal origin: Secondary | ICD-10-CM | POA: Diagnosis not present

## 2021-11-18 DIAGNOSIS — Z992 Dependence on renal dialysis: Secondary | ICD-10-CM | POA: Diagnosis not present

## 2021-11-18 DIAGNOSIS — N186 End stage renal disease: Secondary | ICD-10-CM | POA: Diagnosis not present

## 2021-11-20 DIAGNOSIS — D631 Anemia in chronic kidney disease: Secondary | ICD-10-CM | POA: Diagnosis not present

## 2021-11-20 DIAGNOSIS — Z992 Dependence on renal dialysis: Secondary | ICD-10-CM | POA: Diagnosis not present

## 2021-11-20 DIAGNOSIS — N186 End stage renal disease: Secondary | ICD-10-CM | POA: Diagnosis not present

## 2021-11-20 DIAGNOSIS — N2581 Secondary hyperparathyroidism of renal origin: Secondary | ICD-10-CM | POA: Diagnosis not present

## 2021-11-22 DIAGNOSIS — D631 Anemia in chronic kidney disease: Secondary | ICD-10-CM | POA: Diagnosis not present

## 2021-11-22 DIAGNOSIS — Z992 Dependence on renal dialysis: Secondary | ICD-10-CM | POA: Diagnosis not present

## 2021-11-22 DIAGNOSIS — N2581 Secondary hyperparathyroidism of renal origin: Secondary | ICD-10-CM | POA: Diagnosis not present

## 2021-11-22 DIAGNOSIS — N186 End stage renal disease: Secondary | ICD-10-CM | POA: Diagnosis not present

## 2021-11-23 DIAGNOSIS — H4052X4 Glaucoma secondary to other eye disorders, left eye, indeterminate stage: Secondary | ICD-10-CM | POA: Diagnosis not present

## 2021-11-25 DIAGNOSIS — D631 Anemia in chronic kidney disease: Secondary | ICD-10-CM | POA: Diagnosis not present

## 2021-11-25 DIAGNOSIS — N186 End stage renal disease: Secondary | ICD-10-CM | POA: Diagnosis not present

## 2021-11-25 DIAGNOSIS — N2581 Secondary hyperparathyroidism of renal origin: Secondary | ICD-10-CM | POA: Diagnosis not present

## 2021-11-25 DIAGNOSIS — Z992 Dependence on renal dialysis: Secondary | ICD-10-CM | POA: Diagnosis not present

## 2021-11-27 DIAGNOSIS — N2581 Secondary hyperparathyroidism of renal origin: Secondary | ICD-10-CM | POA: Diagnosis not present

## 2021-11-27 DIAGNOSIS — Z992 Dependence on renal dialysis: Secondary | ICD-10-CM | POA: Diagnosis not present

## 2021-11-27 DIAGNOSIS — N186 End stage renal disease: Secondary | ICD-10-CM | POA: Diagnosis not present

## 2021-11-27 DIAGNOSIS — D631 Anemia in chronic kidney disease: Secondary | ICD-10-CM | POA: Diagnosis not present

## 2021-11-29 ENCOUNTER — Encounter (INDEPENDENT_AMBULATORY_CARE_PROVIDER_SITE_OTHER): Payer: Medicare Other | Admitting: Ophthalmology

## 2021-11-29 DIAGNOSIS — I10 Essential (primary) hypertension: Secondary | ICD-10-CM

## 2021-11-29 DIAGNOSIS — B0052 Herpesviral keratitis: Secondary | ICD-10-CM

## 2021-11-29 DIAGNOSIS — H35033 Hypertensive retinopathy, bilateral: Secondary | ICD-10-CM

## 2021-11-29 DIAGNOSIS — H34831 Tributary (branch) retinal vein occlusion, right eye, with macular edema: Secondary | ICD-10-CM

## 2021-11-29 DIAGNOSIS — H1820 Unspecified corneal edema: Secondary | ICD-10-CM

## 2021-11-29 DIAGNOSIS — H25813 Combined forms of age-related cataract, bilateral: Secondary | ICD-10-CM

## 2021-11-29 DIAGNOSIS — H34812 Central retinal vein occlusion, left eye, with macular edema: Secondary | ICD-10-CM

## 2021-11-29 DIAGNOSIS — H43813 Vitreous degeneration, bilateral: Secondary | ICD-10-CM

## 2021-11-29 DIAGNOSIS — H209 Unspecified iridocyclitis: Secondary | ICD-10-CM

## 2021-11-29 DIAGNOSIS — H4052X2 Glaucoma secondary to other eye disorders, left eye, moderate stage: Secondary | ICD-10-CM

## 2021-11-29 DIAGNOSIS — H35412 Lattice degeneration of retina, left eye: Secondary | ICD-10-CM

## 2021-11-29 DIAGNOSIS — H33332 Multiple defects of retina without detachment, left eye: Secondary | ICD-10-CM

## 2021-11-30 DIAGNOSIS — D631 Anemia in chronic kidney disease: Secondary | ICD-10-CM | POA: Diagnosis not present

## 2021-11-30 DIAGNOSIS — Q612 Polycystic kidney, adult type: Secondary | ICD-10-CM | POA: Diagnosis not present

## 2021-11-30 DIAGNOSIS — N186 End stage renal disease: Secondary | ICD-10-CM | POA: Diagnosis not present

## 2021-11-30 DIAGNOSIS — Z992 Dependence on renal dialysis: Secondary | ICD-10-CM | POA: Diagnosis not present

## 2021-11-30 DIAGNOSIS — N2581 Secondary hyperparathyroidism of renal origin: Secondary | ICD-10-CM | POA: Diagnosis not present

## 2021-11-30 DIAGNOSIS — D509 Iron deficiency anemia, unspecified: Secondary | ICD-10-CM | POA: Diagnosis not present

## 2021-12-02 DIAGNOSIS — N2581 Secondary hyperparathyroidism of renal origin: Secondary | ICD-10-CM | POA: Diagnosis not present

## 2021-12-02 DIAGNOSIS — N186 End stage renal disease: Secondary | ICD-10-CM | POA: Diagnosis not present

## 2021-12-02 DIAGNOSIS — Z992 Dependence on renal dialysis: Secondary | ICD-10-CM | POA: Diagnosis not present

## 2021-12-02 DIAGNOSIS — D631 Anemia in chronic kidney disease: Secondary | ICD-10-CM | POA: Diagnosis not present

## 2021-12-02 DIAGNOSIS — D509 Iron deficiency anemia, unspecified: Secondary | ICD-10-CM | POA: Diagnosis not present

## 2021-12-03 ENCOUNTER — Encounter: Payer: Self-pay | Admitting: Internal Medicine

## 2021-12-03 ENCOUNTER — Ambulatory Visit (INDEPENDENT_AMBULATORY_CARE_PROVIDER_SITE_OTHER): Payer: Medicare Other | Admitting: Internal Medicine

## 2021-12-03 VITALS — HR 60 | Ht 67.0 in | Wt 188.0 lb

## 2021-12-03 DIAGNOSIS — I5022 Chronic systolic (congestive) heart failure: Secondary | ICD-10-CM | POA: Diagnosis not present

## 2021-12-03 DIAGNOSIS — I472 Ventricular tachycardia, unspecified: Secondary | ICD-10-CM

## 2021-12-03 NOTE — Patient Instructions (Addendum)
Medication Instructions:  Your physician recommends that you continue on your current medications as directed. Please refer to the Current Medication list given to you today.  *If you need a refill on your cardiac medications before your next appointment, please call your pharmacy* ( Continue taking your medications as prescribed. )  Lab Work: None ordered.  If you have labs (blood work) drawn today and your tests are completely normal, you will receive your results only by: [place] (if you have MyChart) OR A paper copy in the mail If you have any lab test that is abnormal or we need to change your treatment, we will call you to review the results.  Testing/Procedures: None ordered.  Follow-Up: At [facility], you and your health needs are our priority.  As part of our continuing mission to provide you with exceptional heart care, we have created designated Provider Care Teams.  These Care Teams include your primary Cardiologist (physician) and Advanced Practice Providers (APPs -  Physician Assistants and Nurse Practitioners) who all work together to provide you with the care you need, when you need it.  We recommend signing up for the patient portal called "MyChart".  Sign up information is provided on this After Visit Summary.  MyChart is used to connect with patients for Virtual Visits (Telemedicine).  Patients are able to view lab/test results, encounter notes, upcoming appointments, etc.  Non-urgent messages can be sent to your provider as well.   To learn more about what you can do with MyChart, go to [email or web address].    Your next appointment:   1 year(s)  The format for your next appointment:   In Person  Provider:   Cristopher Peru, MD{or one of the following Advanced Practice Providers on your designated Care Team:   Tommye Standard, [place] Legrand Como "Jonni Sanger" Chalmers Cater, [place]    Important Information About Sugar

## 2021-12-03 NOTE — Progress Notes (Signed)
HPI  Mr. Chismar returns today for followup. He is a pleasant 66 yo man with ESRD on HD, prior VT, with indwelling HD catheter and chronic systolic heart failure with an EF of 20-25%. He has class 2A symptoms despite his LV dysfunction. He has been walking regularly. He has not had anginal symptoms. His HD has gone well. He does not have sob before HD and no chest pain after HD. No edema. No syncope. He is not interested in pursuing a fistula. He has been saddened by the loss of his son who died of an aneursym a few weeks ago.    Current Outpatient Medications  Medication Sig Dispense Refill   apixaban (ELIQUIS) 5 MG TABS tablet Take 1 tablet by mouth twice daily (Patient taking differently: Take 5 mg by mouth daily.) 180 tablet 1   cinacalcet (SENSIPAR) 30 MG tablet Take 30 mg by mouth daily.     losartan (COZAAR) 25 MG tablet Take 25 mg by mouth daily.     metoprolol succinate (TOPROL XL) 25 MG 24 hr tablet Take 1 tablet (25 mg total) by mouth at bedtime. 90 tablet 3   nitroGLYCERIN (NITROSTAT) 0.4 MG SL tablet Place 1 tablet (0.4 mg total) under the tongue every 5 (five) minutes x 3 doses as needed for chest pain. 30 tablet 12   rosuvastatin (CRESTOR) 20 MG tablet Take 20 mg by mouth daily.     sevelamer carbonate (RENVELA) 800 MG tablet Take 1,600 mg by mouth 3 (three) times daily with meals.     spironolactone (ALDACTONE) 25 MG tablet Take 25 mg by mouth daily.     No current facility-administered medications for this visit.     Past Medical History:  Diagnosis Date   Atrial arrhythmia    atrial tachycardia with variable AV conduction versus atypical aflutter 01/10/19, rate control (02/06/19)   Cataract    Dyspnea    on exertion   ESRD (end stage renal disease) ([place])    Hemo- MWF, Polycystic kidney disease   Fatigue    History of kidney stones    removal of stone- cysto   Hyperlipidemia    Hyperparathyroidism, secondary renal ([place])    Hypertension    Hypoxemia  12/12/2013   Nonischemic cardiomyopathy ([place])    Er 25% 2015, 55 % 2013   OSA on CPAP    no longer using cpap   OSA on CPAP 03/24/2014   Pneumonia    2015ish   Prostate cancer ([place])    Ventricular tachycardia//Freq PVCs    Wears glasses     ROS:   All systems reviewed and negative except as noted in the HPI.   Past Surgical History:  Procedure Laterality Date   A-FLUTTER ABLATION N/A 04/11/2019   Procedure: A-FLUTTER ABLATION;  Surgeon: Evans Lance, MD;  Location: [facility];  Service: Cardiovascular;  Laterality: N/A;   A/V FISTULAGRAM Left 04/27/2017   Procedure: A/V FISTULAGRAM;  Surgeon: Conrad Stokes, MD;  Location: [facility];  Service: Cardiovascular;  Laterality: Left;  lt arm   A/V FISTULAGRAM Left 01/10/2019   Procedure: A/V FISTULAGRAM;  Surgeon: Marty Heck, MD;  Location: [facility];  Service: Cardiovascular;  Laterality: Left;   APPENDECTOMY     AV FISTULA PLACEMENT  12/05/2011   Procedure: ARTERIOVENOUS (AV) FISTULA CREATION;LLEFT ARM  Surgeon: Conrad Montpelier, MD;  Location: [facility];  Service: Vascular;  Laterality: Left;  RADIO-CEPHALIC  fistula left  arm   AV FISTULA PLACEMENT  01/11/2012   Procedure: ARTERIOVENOUS (AV) FISTULA CREATION;  Surgeon: Conrad Sussex, MD;  Location: [facility];  Service: Vascular;  Laterality: Left;  Creation of left brachial cephalic arteriovenous fistula   AV FISTULA PLACEMENT Right 03/07/2019   Procedure: ARTERIOVENOUS (AV) FISTULA CREATION  RIGHT ARM;  Surgeon: Marty Heck, MD;  Location: [facility];  Service: Vascular;  Laterality: Right;   [place] Left 12/27/2016   Procedure: BASILIC VEIN TRANSPOSITION LEFT UPPER ARM FIRST STAGE;  Surgeon: Conrad Fayette, MD;  Location: [facility];  Service: Vascular;  Laterality: Left;   [place] Left 01/31/2017   Procedure: LEFT ARM BASILIC VEIN TRANSPOSITION, SECOND STAGE;  Surgeon: Conrad Anderson, MD;  Location: [facility];  Service: Vascular;   Laterality: Left;   CARDIAC CATHETERIZATION  04-05-2010   checking for blockage but none-[facility]   COLONOSCOPY     CYSTOSCOPY W/ STONE MANIPULATION     "laser once" (01/22/2013)   HEMATOMA EVACUATION Left 05/09/2017   Procedure: EVACUATION HEMATOMA LEFT ARM;  Surgeon: Conrad Chesapeake, MD;  Location: [facility];  Service: Vascular;  Laterality: Left;   HERNIA REPAIR     INGUINAL HERNIA REPAIR Right 11/06/2015   Procedure: OPEN HERNIA REPAIR  RIGHT INGUINAL ADULT;  Surgeon: [name], MD;  Location: [facility];  Service: General;  Laterality: Right;  with MESH   INSERTION OF DIALYSIS CATHETER Right 10/05/2016   Procedure: INSERTION OF right internal jugular DIALYSIS CATHETER;  Surgeon: [name], MD;  Location: [facility];  Service: Vascular;  Laterality: Right;   INSERTION OF MESH  03/20/2012   Procedure: INSERTION OF MESH;  Danella Maiers Surgeon: [name], MD;  Location: [facility];  Service: General;  Laterality: N/A;   INSERTION OF MESH N/A 01/22/2013   Procedure: INSERTION OF MESH;  Surgeon: [name], MD;  Location: [facility];  Service: General;  Laterality: N/A;   LAPAROTOMY  04/02/2012   Procedure: EXPLORATORY LAPAROTOMY;  Surgeon: [name], MD;  Location: [facility];  Service: General;  Laterality: N/A;  Exploratory Laparotomy with resection of small intestine   LEFT HEART CATHETERIZATION WITH CORONARY ANGIOGRAM N/A 05/14/2013   Procedure: LEFT HEART CATHETERIZATION WITH CORONARY ANGIOGRAM;  Surgeon: Sinclair Grooms, MD;  Location: [facility];  Service: Cardiovascular;  Laterality: N/A;   LIGATION OF ARTERIOVENOUS  FISTULA Left 12/27/2016   Procedure: LIGATION/EXCISION OF LEFT UPPER ARM ARTERIOVENOUS  FISTULA;  EVACUATION OF HEMATOMA;  Surgeon: Conrad Kearny, MD;  Location: [facility];  Service: Vascular;  Laterality: Left;   LIGATION OF ARTERIOVENOUS  FISTULA Left 03/07/2019   Procedure: LIGATION OF ARTERIOVENOUS FISTULA  LEFT UPPER ARM;  Surgeon: Marty Heck, MD;  Location: [facility];  Service:  Vascular;  Laterality: Left;   REVISON OF ARTERIOVENOUS FISTULA Left 10/05/2016   Procedure: REVISON OF left arm ARTERIOVENOUS FISTULA;  Surgeon: [name], MD;  Location: [facility];  Service: Vascular;  Laterality: Left;   TONSILLECTOMY     UMBILICAL HERNIA REPAIR  03/20/2012   Procedure: HERNIA REPAIR UMBILICAL ADULT;  Surgeon: [name], MD;  Location: [facility];  Service: General;  Laterality: N/A;   UMBILICAL HERNIA REPAIR  01/22/2013   preperitoneal open procedure due to significant adhesions/notes 01/22/2013   VENTRAL HERNIA REPAIR N/A 01/22/2013   Procedure: ATTEMPTED LAPAROSCOPIC VENTRAL HERNIA CONVERTED TO OPEN;  Surgeon: [name], MD;  Location: [facility];  Service: General;  Laterality: N/A;     Family  History  Problem Relation Age of Onset   Heart disease Mother    Hyperlipidemia Mother    Hypertension Mother    Kidney disease Father    Stroke Father    Kidney disease Brother    Amblyopia Neg Hx    Blindness Neg Hx    Cataracts Neg Hx    Diabetes Neg Hx    Glaucoma Neg Hx    Macular degeneration Neg Hx    Retinal detachment Neg Hx    Strabismus Neg Hx    Retinitis pigmentosa Neg Hx    Breast cancer Neg Hx    Prostate cancer Neg Hx    Colon cancer Neg Hx    Pancreatic cancer Neg Hx      Social History   Socioeconomic History   Marital status: Married    Spouse name: [name]   Number of children: 3   Years of education: 12   Highest education level: Not on file  Occupational History    Comment: disabled  Tobacco Use   Smoking status: Never   Smokeless tobacco: Never  Vaping Use   Vaping Use: Never used  Substance and Sexual Activity   Alcohol use: No    Alcohol/week: 0.0 standard drinks of alcohol   Drug use: No   Sexual activity: Yes  Other Topics Concern   Not on file  Social History Narrative   Patient is married (Henrietta) and lives at home with his wife and children.   Patient has three children.   Patient is in disability.   Patient has a  high school education.   Patient is right-handed   Patient drinks very little soda.            Social Determinants of Health   Financial Resource Strain: Not on file  Food Insecurity: Not on file  Transportation Needs: Not on file  Physical Activity: Not on file  Stress: Not on file  Social Connections: Not on file  Intimate Partner Violence: Not on file     Pulse 60   Ht '5\' 7"'$  (1.702 m)   Wt 188 lb (85.3 kg)   SpO2 96%   BMI 29.44 kg/m   Physical Exam:  Well appearing NAD HEENT: Unremarkable Neck:  No JVD, no thyromegally Lymphatics:  No adenopathy Back:  No CVA tenderness Lungs:  Clear with no wheezes HEART:  Regular rate rhythm, no murmurs, no rubs, no clicks Abd:  soft, positive bowel sounds, no organomegally, no rebound, no guarding Ext:  2 plus pulses, no edema, no cyanosis, no clubbing Skin:  No rashes no nodules Neuro:  CN II through XII intact, motor grossly intact  Assess/Plan:  1. Chronic systolic heart failure - with HD, and medical therapy, his symptoms are well compensated. His severe LV dysfunction is worrisome but he appears to be tolerating physical activity which I have encouraged him to continue 2. VT - he is well controlled. He had been on amio in the past. He is not a candidate for ICD insertion with his indwelling HD catheter 3. HTN - his bp is well controlled. He will maintain a low sodium diet.   Carleene Overlie Zareena Willis,MD

## 2021-12-04 DIAGNOSIS — N186 End stage renal disease: Secondary | ICD-10-CM | POA: Diagnosis not present

## 2021-12-04 DIAGNOSIS — Z992 Dependence on renal dialysis: Secondary | ICD-10-CM | POA: Diagnosis not present

## 2021-12-04 DIAGNOSIS — N2581 Secondary hyperparathyroidism of renal origin: Secondary | ICD-10-CM | POA: Diagnosis not present

## 2021-12-04 DIAGNOSIS — D509 Iron deficiency anemia, unspecified: Secondary | ICD-10-CM | POA: Diagnosis not present

## 2021-12-04 DIAGNOSIS — D631 Anemia in chronic kidney disease: Secondary | ICD-10-CM | POA: Diagnosis not present

## 2021-12-07 DIAGNOSIS — N186 End stage renal disease: Secondary | ICD-10-CM | POA: Diagnosis not present

## 2021-12-07 DIAGNOSIS — N2581 Secondary hyperparathyroidism of renal origin: Secondary | ICD-10-CM | POA: Diagnosis not present

## 2021-12-07 DIAGNOSIS — D631 Anemia in chronic kidney disease: Secondary | ICD-10-CM | POA: Diagnosis not present

## 2021-12-07 DIAGNOSIS — D509 Iron deficiency anemia, unspecified: Secondary | ICD-10-CM | POA: Diagnosis not present

## 2021-12-07 DIAGNOSIS — Z992 Dependence on renal dialysis: Secondary | ICD-10-CM | POA: Diagnosis not present

## 2021-12-09 DIAGNOSIS — N2581 Secondary hyperparathyroidism of renal origin: Secondary | ICD-10-CM | POA: Diagnosis not present

## 2021-12-09 DIAGNOSIS — D509 Iron deficiency anemia, unspecified: Secondary | ICD-10-CM | POA: Diagnosis not present

## 2021-12-09 DIAGNOSIS — Z992 Dependence on renal dialysis: Secondary | ICD-10-CM | POA: Diagnosis not present

## 2021-12-09 DIAGNOSIS — N186 End stage renal disease: Secondary | ICD-10-CM | POA: Diagnosis not present

## 2021-12-09 DIAGNOSIS — D631 Anemia in chronic kidney disease: Secondary | ICD-10-CM | POA: Diagnosis not present

## 2021-12-11 DIAGNOSIS — D631 Anemia in chronic kidney disease: Secondary | ICD-10-CM | POA: Diagnosis not present

## 2021-12-11 DIAGNOSIS — N2581 Secondary hyperparathyroidism of renal origin: Secondary | ICD-10-CM | POA: Diagnosis not present

## 2021-12-11 DIAGNOSIS — Z992 Dependence on renal dialysis: Secondary | ICD-10-CM | POA: Diagnosis not present

## 2021-12-11 DIAGNOSIS — D509 Iron deficiency anemia, unspecified: Secondary | ICD-10-CM | POA: Diagnosis not present

## 2021-12-11 DIAGNOSIS — N186 End stage renal disease: Secondary | ICD-10-CM | POA: Diagnosis not present

## 2021-12-14 DIAGNOSIS — N2581 Secondary hyperparathyroidism of renal origin: Secondary | ICD-10-CM | POA: Diagnosis not present

## 2021-12-14 DIAGNOSIS — N186 End stage renal disease: Secondary | ICD-10-CM | POA: Diagnosis not present

## 2021-12-14 DIAGNOSIS — D509 Iron deficiency anemia, unspecified: Secondary | ICD-10-CM | POA: Diagnosis not present

## 2021-12-14 DIAGNOSIS — Z992 Dependence on renal dialysis: Secondary | ICD-10-CM | POA: Diagnosis not present

## 2021-12-14 DIAGNOSIS — D631 Anemia in chronic kidney disease: Secondary | ICD-10-CM | POA: Diagnosis not present

## 2021-12-16 DIAGNOSIS — D631 Anemia in chronic kidney disease: Secondary | ICD-10-CM | POA: Diagnosis not present

## 2021-12-16 DIAGNOSIS — Z992 Dependence on renal dialysis: Secondary | ICD-10-CM | POA: Diagnosis not present

## 2021-12-16 DIAGNOSIS — N2581 Secondary hyperparathyroidism of renal origin: Secondary | ICD-10-CM | POA: Diagnosis not present

## 2021-12-16 DIAGNOSIS — D509 Iron deficiency anemia, unspecified: Secondary | ICD-10-CM | POA: Diagnosis not present

## 2021-12-16 DIAGNOSIS — N186 End stage renal disease: Secondary | ICD-10-CM | POA: Diagnosis not present

## 2021-12-21 DIAGNOSIS — N186 End stage renal disease: Secondary | ICD-10-CM | POA: Diagnosis not present

## 2021-12-21 DIAGNOSIS — N2581 Secondary hyperparathyroidism of renal origin: Secondary | ICD-10-CM | POA: Diagnosis not present

## 2021-12-21 DIAGNOSIS — D631 Anemia in chronic kidney disease: Secondary | ICD-10-CM | POA: Diagnosis not present

## 2021-12-21 DIAGNOSIS — Z992 Dependence on renal dialysis: Secondary | ICD-10-CM | POA: Diagnosis not present

## 2021-12-21 DIAGNOSIS — D509 Iron deficiency anemia, unspecified: Secondary | ICD-10-CM | POA: Diagnosis not present

## 2021-12-23 DIAGNOSIS — Z992 Dependence on renal dialysis: Secondary | ICD-10-CM | POA: Diagnosis not present

## 2021-12-23 DIAGNOSIS — N2581 Secondary hyperparathyroidism of renal origin: Secondary | ICD-10-CM | POA: Diagnosis not present

## 2021-12-23 DIAGNOSIS — N186 End stage renal disease: Secondary | ICD-10-CM | POA: Diagnosis not present

## 2021-12-23 DIAGNOSIS — D631 Anemia in chronic kidney disease: Secondary | ICD-10-CM | POA: Diagnosis not present

## 2021-12-23 DIAGNOSIS — D509 Iron deficiency anemia, unspecified: Secondary | ICD-10-CM | POA: Diagnosis not present

## 2021-12-25 DIAGNOSIS — N2581 Secondary hyperparathyroidism of renal origin: Secondary | ICD-10-CM | POA: Diagnosis not present

## 2021-12-25 DIAGNOSIS — D631 Anemia in chronic kidney disease: Secondary | ICD-10-CM | POA: Diagnosis not present

## 2021-12-25 DIAGNOSIS — Z992 Dependence on renal dialysis: Secondary | ICD-10-CM | POA: Diagnosis not present

## 2021-12-25 DIAGNOSIS — D509 Iron deficiency anemia, unspecified: Secondary | ICD-10-CM | POA: Diagnosis not present

## 2021-12-25 DIAGNOSIS — N186 End stage renal disease: Secondary | ICD-10-CM | POA: Diagnosis not present

## 2021-12-28 DIAGNOSIS — Z992 Dependence on renal dialysis: Secondary | ICD-10-CM | POA: Diagnosis not present

## 2021-12-28 DIAGNOSIS — D509 Iron deficiency anemia, unspecified: Secondary | ICD-10-CM | POA: Diagnosis not present

## 2021-12-28 DIAGNOSIS — D631 Anemia in chronic kidney disease: Secondary | ICD-10-CM | POA: Diagnosis not present

## 2021-12-28 DIAGNOSIS — N2581 Secondary hyperparathyroidism of renal origin: Secondary | ICD-10-CM | POA: Diagnosis not present

## 2021-12-28 DIAGNOSIS — N186 End stage renal disease: Secondary | ICD-10-CM | POA: Diagnosis not present

## 2021-12-30 DIAGNOSIS — N186 End stage renal disease: Secondary | ICD-10-CM | POA: Diagnosis not present

## 2021-12-30 DIAGNOSIS — N2581 Secondary hyperparathyroidism of renal origin: Secondary | ICD-10-CM | POA: Diagnosis not present

## 2021-12-30 DIAGNOSIS — D631 Anemia in chronic kidney disease: Secondary | ICD-10-CM | POA: Diagnosis not present

## 2021-12-30 DIAGNOSIS — D509 Iron deficiency anemia, unspecified: Secondary | ICD-10-CM | POA: Diagnosis not present

## 2021-12-30 DIAGNOSIS — Z992 Dependence on renal dialysis: Secondary | ICD-10-CM | POA: Diagnosis not present

## 2021-12-31 DIAGNOSIS — Z992 Dependence on renal dialysis: Secondary | ICD-10-CM | POA: Diagnosis not present

## 2021-12-31 DIAGNOSIS — Q612 Polycystic kidney, adult type: Secondary | ICD-10-CM | POA: Diagnosis not present

## 2021-12-31 DIAGNOSIS — N186 End stage renal disease: Secondary | ICD-10-CM | POA: Diagnosis not present

## 2022-01-01 DIAGNOSIS — N186 End stage renal disease: Secondary | ICD-10-CM | POA: Diagnosis not present

## 2022-01-01 DIAGNOSIS — D509 Iron deficiency anemia, unspecified: Secondary | ICD-10-CM | POA: Diagnosis not present

## 2022-01-01 DIAGNOSIS — N2581 Secondary hyperparathyroidism of renal origin: Secondary | ICD-10-CM | POA: Diagnosis not present

## 2022-01-01 DIAGNOSIS — Z992 Dependence on renal dialysis: Secondary | ICD-10-CM | POA: Diagnosis not present

## 2022-01-01 DIAGNOSIS — D631 Anemia in chronic kidney disease: Secondary | ICD-10-CM | POA: Diagnosis not present

## 2022-01-04 DIAGNOSIS — D631 Anemia in chronic kidney disease: Secondary | ICD-10-CM | POA: Diagnosis not present

## 2022-01-04 DIAGNOSIS — D509 Iron deficiency anemia, unspecified: Secondary | ICD-10-CM | POA: Diagnosis not present

## 2022-01-04 DIAGNOSIS — N2581 Secondary hyperparathyroidism of renal origin: Secondary | ICD-10-CM | POA: Diagnosis not present

## 2022-01-04 DIAGNOSIS — Z992 Dependence on renal dialysis: Secondary | ICD-10-CM | POA: Diagnosis not present

## 2022-01-04 DIAGNOSIS — N186 End stage renal disease: Secondary | ICD-10-CM | POA: Diagnosis not present

## 2022-01-06 DIAGNOSIS — D631 Anemia in chronic kidney disease: Secondary | ICD-10-CM | POA: Diagnosis not present

## 2022-01-06 DIAGNOSIS — N2581 Secondary hyperparathyroidism of renal origin: Secondary | ICD-10-CM | POA: Diagnosis not present

## 2022-01-06 DIAGNOSIS — Z992 Dependence on renal dialysis: Secondary | ICD-10-CM | POA: Diagnosis not present

## 2022-01-06 DIAGNOSIS — N186 End stage renal disease: Secondary | ICD-10-CM | POA: Diagnosis not present

## 2022-01-06 DIAGNOSIS — D509 Iron deficiency anemia, unspecified: Secondary | ICD-10-CM | POA: Diagnosis not present

## 2022-01-08 DIAGNOSIS — D509 Iron deficiency anemia, unspecified: Secondary | ICD-10-CM | POA: Diagnosis not present

## 2022-01-08 DIAGNOSIS — Z992 Dependence on renal dialysis: Secondary | ICD-10-CM | POA: Diagnosis not present

## 2022-01-08 DIAGNOSIS — N2581 Secondary hyperparathyroidism of renal origin: Secondary | ICD-10-CM | POA: Diagnosis not present

## 2022-01-08 DIAGNOSIS — D631 Anemia in chronic kidney disease: Secondary | ICD-10-CM | POA: Diagnosis not present

## 2022-01-08 DIAGNOSIS — N186 End stage renal disease: Secondary | ICD-10-CM | POA: Diagnosis not present

## 2022-01-11 DIAGNOSIS — N186 End stage renal disease: Secondary | ICD-10-CM | POA: Diagnosis not present

## 2022-01-11 DIAGNOSIS — D631 Anemia in chronic kidney disease: Secondary | ICD-10-CM | POA: Diagnosis not present

## 2022-01-11 DIAGNOSIS — D509 Iron deficiency anemia, unspecified: Secondary | ICD-10-CM | POA: Diagnosis not present

## 2022-01-11 DIAGNOSIS — N2581 Secondary hyperparathyroidism of renal origin: Secondary | ICD-10-CM | POA: Diagnosis not present

## 2022-01-11 DIAGNOSIS — Z992 Dependence on renal dialysis: Secondary | ICD-10-CM | POA: Diagnosis not present

## 2022-01-13 DIAGNOSIS — N2581 Secondary hyperparathyroidism of renal origin: Secondary | ICD-10-CM | POA: Diagnosis not present

## 2022-01-13 DIAGNOSIS — N186 End stage renal disease: Secondary | ICD-10-CM | POA: Diagnosis not present

## 2022-01-13 DIAGNOSIS — D509 Iron deficiency anemia, unspecified: Secondary | ICD-10-CM | POA: Diagnosis not present

## 2022-01-13 DIAGNOSIS — Z992 Dependence on renal dialysis: Secondary | ICD-10-CM | POA: Diagnosis not present

## 2022-01-13 DIAGNOSIS — D631 Anemia in chronic kidney disease: Secondary | ICD-10-CM | POA: Diagnosis not present

## 2022-01-15 DIAGNOSIS — N2581 Secondary hyperparathyroidism of renal origin: Secondary | ICD-10-CM | POA: Diagnosis not present

## 2022-01-15 DIAGNOSIS — N186 End stage renal disease: Secondary | ICD-10-CM | POA: Diagnosis not present

## 2022-01-15 DIAGNOSIS — Z992 Dependence on renal dialysis: Secondary | ICD-10-CM | POA: Diagnosis not present

## 2022-01-15 DIAGNOSIS — D631 Anemia in chronic kidney disease: Secondary | ICD-10-CM | POA: Diagnosis not present

## 2022-01-15 DIAGNOSIS — D509 Iron deficiency anemia, unspecified: Secondary | ICD-10-CM | POA: Diagnosis not present

## 2022-01-18 DIAGNOSIS — N2581 Secondary hyperparathyroidism of renal origin: Secondary | ICD-10-CM | POA: Diagnosis not present

## 2022-01-18 DIAGNOSIS — N186 End stage renal disease: Secondary | ICD-10-CM | POA: Diagnosis not present

## 2022-01-18 DIAGNOSIS — Z992 Dependence on renal dialysis: Secondary | ICD-10-CM | POA: Diagnosis not present

## 2022-01-18 DIAGNOSIS — D509 Iron deficiency anemia, unspecified: Secondary | ICD-10-CM | POA: Diagnosis not present

## 2022-01-18 DIAGNOSIS — D631 Anemia in chronic kidney disease: Secondary | ICD-10-CM | POA: Diagnosis not present

## 2022-01-20 DIAGNOSIS — D509 Iron deficiency anemia, unspecified: Secondary | ICD-10-CM | POA: Diagnosis not present

## 2022-01-20 DIAGNOSIS — Z992 Dependence on renal dialysis: Secondary | ICD-10-CM | POA: Diagnosis not present

## 2022-01-20 DIAGNOSIS — N186 End stage renal disease: Secondary | ICD-10-CM | POA: Diagnosis not present

## 2022-01-20 DIAGNOSIS — N2581 Secondary hyperparathyroidism of renal origin: Secondary | ICD-10-CM | POA: Diagnosis not present

## 2022-01-20 DIAGNOSIS — D631 Anemia in chronic kidney disease: Secondary | ICD-10-CM | POA: Diagnosis not present

## 2022-01-25 DIAGNOSIS — N186 End stage renal disease: Secondary | ICD-10-CM | POA: Diagnosis not present

## 2022-01-25 DIAGNOSIS — D631 Anemia in chronic kidney disease: Secondary | ICD-10-CM | POA: Diagnosis not present

## 2022-01-25 DIAGNOSIS — Z992 Dependence on renal dialysis: Secondary | ICD-10-CM | POA: Diagnosis not present

## 2022-01-25 DIAGNOSIS — N2581 Secondary hyperparathyroidism of renal origin: Secondary | ICD-10-CM | POA: Diagnosis not present

## 2022-01-25 DIAGNOSIS — D509 Iron deficiency anemia, unspecified: Secondary | ICD-10-CM | POA: Diagnosis not present

## 2022-01-27 DIAGNOSIS — D631 Anemia in chronic kidney disease: Secondary | ICD-10-CM | POA: Diagnosis not present

## 2022-01-27 DIAGNOSIS — D509 Iron deficiency anemia, unspecified: Secondary | ICD-10-CM | POA: Diagnosis not present

## 2022-01-27 DIAGNOSIS — N2581 Secondary hyperparathyroidism of renal origin: Secondary | ICD-10-CM | POA: Diagnosis not present

## 2022-01-27 DIAGNOSIS — Z992 Dependence on renal dialysis: Secondary | ICD-10-CM | POA: Diagnosis not present

## 2022-01-27 DIAGNOSIS — N186 End stage renal disease: Secondary | ICD-10-CM | POA: Diagnosis not present

## 2022-01-29 DIAGNOSIS — N186 End stage renal disease: Secondary | ICD-10-CM | POA: Diagnosis not present

## 2022-01-29 DIAGNOSIS — N2581 Secondary hyperparathyroidism of renal origin: Secondary | ICD-10-CM | POA: Diagnosis not present

## 2022-01-29 DIAGNOSIS — Z992 Dependence on renal dialysis: Secondary | ICD-10-CM | POA: Diagnosis not present

## 2022-01-29 DIAGNOSIS — D631 Anemia in chronic kidney disease: Secondary | ICD-10-CM | POA: Diagnosis not present

## 2022-01-29 DIAGNOSIS — D509 Iron deficiency anemia, unspecified: Secondary | ICD-10-CM | POA: Diagnosis not present

## 2022-01-30 DIAGNOSIS — Q612 Polycystic kidney, adult type: Secondary | ICD-10-CM | POA: Diagnosis not present

## 2022-01-30 DIAGNOSIS — Z992 Dependence on renal dialysis: Secondary | ICD-10-CM | POA: Diagnosis not present

## 2022-01-30 DIAGNOSIS — N186 End stage renal disease: Secondary | ICD-10-CM | POA: Diagnosis not present

## 2022-01-30 DIAGNOSIS — Z23 Encounter for immunization: Secondary | ICD-10-CM | POA: Diagnosis not present

## 2022-02-01 DIAGNOSIS — N186 End stage renal disease: Secondary | ICD-10-CM | POA: Diagnosis not present

## 2022-02-01 DIAGNOSIS — N2581 Secondary hyperparathyroidism of renal origin: Secondary | ICD-10-CM | POA: Diagnosis not present

## 2022-02-01 DIAGNOSIS — D631 Anemia in chronic kidney disease: Secondary | ICD-10-CM | POA: Diagnosis not present

## 2022-02-01 DIAGNOSIS — Z992 Dependence on renal dialysis: Secondary | ICD-10-CM | POA: Diagnosis not present

## 2022-02-03 DIAGNOSIS — D631 Anemia in chronic kidney disease: Secondary | ICD-10-CM | POA: Diagnosis not present

## 2022-02-03 DIAGNOSIS — Z992 Dependence on renal dialysis: Secondary | ICD-10-CM | POA: Diagnosis not present

## 2022-02-03 DIAGNOSIS — N2581 Secondary hyperparathyroidism of renal origin: Secondary | ICD-10-CM | POA: Diagnosis not present

## 2022-02-03 DIAGNOSIS — N186 End stage renal disease: Secondary | ICD-10-CM | POA: Diagnosis not present

## 2022-02-05 DIAGNOSIS — N2581 Secondary hyperparathyroidism of renal origin: Secondary | ICD-10-CM | POA: Diagnosis not present

## 2022-02-05 DIAGNOSIS — Z992 Dependence on renal dialysis: Secondary | ICD-10-CM | POA: Diagnosis not present

## 2022-02-05 DIAGNOSIS — D631 Anemia in chronic kidney disease: Secondary | ICD-10-CM | POA: Diagnosis not present

## 2022-02-05 DIAGNOSIS — N186 End stage renal disease: Secondary | ICD-10-CM | POA: Diagnosis not present

## 2022-02-07 ENCOUNTER — Encounter: Payer: Self-pay | Admitting: *Deleted

## 2022-02-07 ENCOUNTER — Telehealth: Payer: Self-pay | Admitting: *Deleted

## 2022-02-07 NOTE — Telephone Encounter (Signed)
Letter has been sent to patient informing them that their sleep study has expired. Patient will need to call and schedule an office visit to re-evaluate the need for a sleep study.    

## 2022-02-08 DIAGNOSIS — D631 Anemia in chronic kidney disease: Secondary | ICD-10-CM | POA: Diagnosis not present

## 2022-02-08 DIAGNOSIS — N186 End stage renal disease: Secondary | ICD-10-CM | POA: Diagnosis not present

## 2022-02-08 DIAGNOSIS — Z992 Dependence on renal dialysis: Secondary | ICD-10-CM | POA: Diagnosis not present

## 2022-02-08 DIAGNOSIS — N2581 Secondary hyperparathyroidism of renal origin: Secondary | ICD-10-CM | POA: Diagnosis not present

## 2022-02-10 DIAGNOSIS — N2581 Secondary hyperparathyroidism of renal origin: Secondary | ICD-10-CM | POA: Diagnosis not present

## 2022-02-10 DIAGNOSIS — N186 End stage renal disease: Secondary | ICD-10-CM | POA: Diagnosis not present

## 2022-02-10 DIAGNOSIS — D631 Anemia in chronic kidney disease: Secondary | ICD-10-CM | POA: Diagnosis not present

## 2022-02-10 DIAGNOSIS — Z992 Dependence on renal dialysis: Secondary | ICD-10-CM | POA: Diagnosis not present

## 2022-02-12 DIAGNOSIS — D631 Anemia in chronic kidney disease: Secondary | ICD-10-CM | POA: Diagnosis not present

## 2022-02-12 DIAGNOSIS — N2581 Secondary hyperparathyroidism of renal origin: Secondary | ICD-10-CM | POA: Diagnosis not present

## 2022-02-12 DIAGNOSIS — Z992 Dependence on renal dialysis: Secondary | ICD-10-CM | POA: Diagnosis not present

## 2022-02-12 DIAGNOSIS — N186 End stage renal disease: Secondary | ICD-10-CM | POA: Diagnosis not present

## 2022-02-15 DIAGNOSIS — N2581 Secondary hyperparathyroidism of renal origin: Secondary | ICD-10-CM | POA: Diagnosis not present

## 2022-02-15 DIAGNOSIS — Z992 Dependence on renal dialysis: Secondary | ICD-10-CM | POA: Diagnosis not present

## 2022-02-15 DIAGNOSIS — D631 Anemia in chronic kidney disease: Secondary | ICD-10-CM | POA: Diagnosis not present

## 2022-02-15 DIAGNOSIS — N186 End stage renal disease: Secondary | ICD-10-CM | POA: Diagnosis not present

## 2022-02-17 DIAGNOSIS — N186 End stage renal disease: Secondary | ICD-10-CM | POA: Diagnosis not present

## 2022-02-17 DIAGNOSIS — N2581 Secondary hyperparathyroidism of renal origin: Secondary | ICD-10-CM | POA: Diagnosis not present

## 2022-02-17 DIAGNOSIS — Z992 Dependence on renal dialysis: Secondary | ICD-10-CM | POA: Diagnosis not present

## 2022-02-17 DIAGNOSIS — D631 Anemia in chronic kidney disease: Secondary | ICD-10-CM | POA: Diagnosis not present

## 2022-02-17 DIAGNOSIS — T82898A Other specified complication of vascular prosthetic devices, implants and grafts, initial encounter: Secondary | ICD-10-CM | POA: Diagnosis not present

## 2022-02-19 DIAGNOSIS — N2581 Secondary hyperparathyroidism of renal origin: Secondary | ICD-10-CM | POA: Diagnosis not present

## 2022-02-19 DIAGNOSIS — D631 Anemia in chronic kidney disease: Secondary | ICD-10-CM | POA: Diagnosis not present

## 2022-02-19 DIAGNOSIS — Z992 Dependence on renal dialysis: Secondary | ICD-10-CM | POA: Diagnosis not present

## 2022-02-19 DIAGNOSIS — N186 End stage renal disease: Secondary | ICD-10-CM | POA: Diagnosis not present

## 2022-02-21 DIAGNOSIS — C61 Malignant neoplasm of prostate: Secondary | ICD-10-CM | POA: Diagnosis not present

## 2022-02-22 DIAGNOSIS — N2581 Secondary hyperparathyroidism of renal origin: Secondary | ICD-10-CM | POA: Diagnosis not present

## 2022-02-22 DIAGNOSIS — N186 End stage renal disease: Secondary | ICD-10-CM | POA: Diagnosis not present

## 2022-02-22 DIAGNOSIS — D631 Anemia in chronic kidney disease: Secondary | ICD-10-CM | POA: Diagnosis not present

## 2022-02-22 DIAGNOSIS — Z992 Dependence on renal dialysis: Secondary | ICD-10-CM | POA: Diagnosis not present

## 2022-02-26 DIAGNOSIS — N2581 Secondary hyperparathyroidism of renal origin: Secondary | ICD-10-CM | POA: Diagnosis not present

## 2022-02-26 DIAGNOSIS — D631 Anemia in chronic kidney disease: Secondary | ICD-10-CM | POA: Diagnosis not present

## 2022-02-26 DIAGNOSIS — Z992 Dependence on renal dialysis: Secondary | ICD-10-CM | POA: Diagnosis not present

## 2022-02-26 DIAGNOSIS — N186 End stage renal disease: Secondary | ICD-10-CM | POA: Diagnosis not present

## 2022-02-28 DIAGNOSIS — C61 Malignant neoplasm of prostate: Secondary | ICD-10-CM | POA: Diagnosis not present

## 2022-03-01 DIAGNOSIS — D631 Anemia in chronic kidney disease: Secondary | ICD-10-CM | POA: Diagnosis not present

## 2022-03-01 DIAGNOSIS — Z992 Dependence on renal dialysis: Secondary | ICD-10-CM | POA: Diagnosis not present

## 2022-03-01 DIAGNOSIS — N186 End stage renal disease: Secondary | ICD-10-CM | POA: Diagnosis not present

## 2022-03-01 DIAGNOSIS — N2581 Secondary hyperparathyroidism of renal origin: Secondary | ICD-10-CM | POA: Diagnosis not present

## 2022-03-02 DIAGNOSIS — N186 End stage renal disease: Secondary | ICD-10-CM | POA: Diagnosis not present

## 2022-03-02 DIAGNOSIS — Q612 Polycystic kidney, adult type: Secondary | ICD-10-CM | POA: Diagnosis not present

## 2022-03-02 DIAGNOSIS — Z992 Dependence on renal dialysis: Secondary | ICD-10-CM | POA: Diagnosis not present

## 2022-03-03 DIAGNOSIS — D631 Anemia in chronic kidney disease: Secondary | ICD-10-CM | POA: Diagnosis not present

## 2022-03-03 DIAGNOSIS — N186 End stage renal disease: Secondary | ICD-10-CM | POA: Diagnosis not present

## 2022-03-03 DIAGNOSIS — N2581 Secondary hyperparathyroidism of renal origin: Secondary | ICD-10-CM | POA: Diagnosis not present

## 2022-03-03 DIAGNOSIS — Z992 Dependence on renal dialysis: Secondary | ICD-10-CM | POA: Diagnosis not present

## 2022-03-08 DIAGNOSIS — N186 End stage renal disease: Secondary | ICD-10-CM | POA: Diagnosis not present

## 2022-03-08 DIAGNOSIS — D631 Anemia in chronic kidney disease: Secondary | ICD-10-CM | POA: Diagnosis not present

## 2022-03-08 DIAGNOSIS — Z992 Dependence on renal dialysis: Secondary | ICD-10-CM | POA: Diagnosis not present

## 2022-03-08 DIAGNOSIS — N2581 Secondary hyperparathyroidism of renal origin: Secondary | ICD-10-CM | POA: Diagnosis not present

## 2022-03-10 DIAGNOSIS — N186 End stage renal disease: Secondary | ICD-10-CM | POA: Diagnosis not present

## 2022-03-10 DIAGNOSIS — N2581 Secondary hyperparathyroidism of renal origin: Secondary | ICD-10-CM | POA: Diagnosis not present

## 2022-03-10 DIAGNOSIS — Z992 Dependence on renal dialysis: Secondary | ICD-10-CM | POA: Diagnosis not present

## 2022-03-10 DIAGNOSIS — D631 Anemia in chronic kidney disease: Secondary | ICD-10-CM | POA: Diagnosis not present

## 2022-03-12 DIAGNOSIS — N2581 Secondary hyperparathyroidism of renal origin: Secondary | ICD-10-CM | POA: Diagnosis not present

## 2022-03-12 DIAGNOSIS — Z992 Dependence on renal dialysis: Secondary | ICD-10-CM | POA: Diagnosis not present

## 2022-03-12 DIAGNOSIS — N186 End stage renal disease: Secondary | ICD-10-CM | POA: Diagnosis not present

## 2022-03-12 DIAGNOSIS — D631 Anemia in chronic kidney disease: Secondary | ICD-10-CM | POA: Diagnosis not present

## 2022-03-15 DIAGNOSIS — N2581 Secondary hyperparathyroidism of renal origin: Secondary | ICD-10-CM | POA: Diagnosis not present

## 2022-03-15 DIAGNOSIS — N186 End stage renal disease: Secondary | ICD-10-CM | POA: Diagnosis not present

## 2022-03-15 DIAGNOSIS — Z992 Dependence on renal dialysis: Secondary | ICD-10-CM | POA: Diagnosis not present

## 2022-03-15 DIAGNOSIS — D631 Anemia in chronic kidney disease: Secondary | ICD-10-CM | POA: Diagnosis not present

## 2022-03-17 DIAGNOSIS — N186 End stage renal disease: Secondary | ICD-10-CM | POA: Diagnosis not present

## 2022-03-17 DIAGNOSIS — Z992 Dependence on renal dialysis: Secondary | ICD-10-CM | POA: Diagnosis not present

## 2022-03-17 DIAGNOSIS — N2581 Secondary hyperparathyroidism of renal origin: Secondary | ICD-10-CM | POA: Diagnosis not present

## 2022-03-17 DIAGNOSIS — D631 Anemia in chronic kidney disease: Secondary | ICD-10-CM | POA: Diagnosis not present

## 2022-03-19 DIAGNOSIS — D631 Anemia in chronic kidney disease: Secondary | ICD-10-CM | POA: Diagnosis not present

## 2022-03-19 DIAGNOSIS — N186 End stage renal disease: Secondary | ICD-10-CM | POA: Diagnosis not present

## 2022-03-19 DIAGNOSIS — Z992 Dependence on renal dialysis: Secondary | ICD-10-CM | POA: Diagnosis not present

## 2022-03-19 DIAGNOSIS — N2581 Secondary hyperparathyroidism of renal origin: Secondary | ICD-10-CM | POA: Diagnosis not present

## 2022-03-21 DIAGNOSIS — Z992 Dependence on renal dialysis: Secondary | ICD-10-CM | POA: Diagnosis not present

## 2022-03-21 DIAGNOSIS — D631 Anemia in chronic kidney disease: Secondary | ICD-10-CM | POA: Diagnosis not present

## 2022-03-21 DIAGNOSIS — N186 End stage renal disease: Secondary | ICD-10-CM | POA: Diagnosis not present

## 2022-03-21 DIAGNOSIS — N2581 Secondary hyperparathyroidism of renal origin: Secondary | ICD-10-CM | POA: Diagnosis not present

## 2022-03-23 DIAGNOSIS — D631 Anemia in chronic kidney disease: Secondary | ICD-10-CM | POA: Diagnosis not present

## 2022-03-23 DIAGNOSIS — Z992 Dependence on renal dialysis: Secondary | ICD-10-CM | POA: Diagnosis not present

## 2022-03-23 DIAGNOSIS — N186 End stage renal disease: Secondary | ICD-10-CM | POA: Diagnosis not present

## 2022-03-23 DIAGNOSIS — N2581 Secondary hyperparathyroidism of renal origin: Secondary | ICD-10-CM | POA: Diagnosis not present

## 2022-03-25 ENCOUNTER — Other Ambulatory Visit: Payer: Self-pay | Admitting: Internal Medicine

## 2022-03-26 DIAGNOSIS — D631 Anemia in chronic kidney disease: Secondary | ICD-10-CM | POA: Diagnosis not present

## 2022-03-26 DIAGNOSIS — Z992 Dependence on renal dialysis: Secondary | ICD-10-CM | POA: Diagnosis not present

## 2022-03-26 DIAGNOSIS — N2581 Secondary hyperparathyroidism of renal origin: Secondary | ICD-10-CM | POA: Diagnosis not present

## 2022-03-26 DIAGNOSIS — N186 End stage renal disease: Secondary | ICD-10-CM | POA: Diagnosis not present

## 2022-03-29 DIAGNOSIS — Z992 Dependence on renal dialysis: Secondary | ICD-10-CM | POA: Diagnosis not present

## 2022-03-29 DIAGNOSIS — N2581 Secondary hyperparathyroidism of renal origin: Secondary | ICD-10-CM | POA: Diagnosis not present

## 2022-03-29 DIAGNOSIS — N186 End stage renal disease: Secondary | ICD-10-CM | POA: Diagnosis not present

## 2022-03-29 DIAGNOSIS — D631 Anemia in chronic kidney disease: Secondary | ICD-10-CM | POA: Diagnosis not present

## 2022-03-31 DIAGNOSIS — Z992 Dependence on renal dialysis: Secondary | ICD-10-CM | POA: Diagnosis not present

## 2022-03-31 DIAGNOSIS — D631 Anemia in chronic kidney disease: Secondary | ICD-10-CM | POA: Diagnosis not present

## 2022-03-31 DIAGNOSIS — N186 End stage renal disease: Secondary | ICD-10-CM | POA: Diagnosis not present

## 2022-03-31 DIAGNOSIS — N2581 Secondary hyperparathyroidism of renal origin: Secondary | ICD-10-CM | POA: Diagnosis not present

## 2022-04-01 DIAGNOSIS — Z992 Dependence on renal dialysis: Secondary | ICD-10-CM | POA: Diagnosis not present

## 2022-04-01 DIAGNOSIS — N186 End stage renal disease: Secondary | ICD-10-CM | POA: Diagnosis not present

## 2022-04-01 DIAGNOSIS — Q612 Polycystic kidney, adult type: Secondary | ICD-10-CM | POA: Diagnosis not present

## 2022-04-01 DEATH — deceased

## 2022-04-02 DIAGNOSIS — N2581 Secondary hyperparathyroidism of renal origin: Secondary | ICD-10-CM | POA: Diagnosis not present

## 2022-04-02 DIAGNOSIS — N186 End stage renal disease: Secondary | ICD-10-CM | POA: Diagnosis not present

## 2022-04-02 DIAGNOSIS — Z992 Dependence on renal dialysis: Secondary | ICD-10-CM | POA: Diagnosis not present

## 2022-04-05 DIAGNOSIS — N2581 Secondary hyperparathyroidism of renal origin: Secondary | ICD-10-CM | POA: Diagnosis not present

## 2022-04-05 DIAGNOSIS — Z992 Dependence on renal dialysis: Secondary | ICD-10-CM | POA: Diagnosis not present

## 2022-04-05 DIAGNOSIS — N186 End stage renal disease: Secondary | ICD-10-CM | POA: Diagnosis not present

## 2022-04-07 DIAGNOSIS — Z992 Dependence on renal dialysis: Secondary | ICD-10-CM | POA: Diagnosis not present

## 2022-04-07 DIAGNOSIS — N2581 Secondary hyperparathyroidism of renal origin: Secondary | ICD-10-CM | POA: Diagnosis not present

## 2022-04-07 DIAGNOSIS — N186 End stage renal disease: Secondary | ICD-10-CM | POA: Diagnosis not present

## 2022-04-09 DIAGNOSIS — N186 End stage renal disease: Secondary | ICD-10-CM | POA: Diagnosis not present

## 2022-04-09 DIAGNOSIS — Z992 Dependence on renal dialysis: Secondary | ICD-10-CM | POA: Diagnosis not present

## 2022-04-09 DIAGNOSIS — N2581 Secondary hyperparathyroidism of renal origin: Secondary | ICD-10-CM | POA: Diagnosis not present

## 2022-04-12 DIAGNOSIS — N2581 Secondary hyperparathyroidism of renal origin: Secondary | ICD-10-CM | POA: Diagnosis not present

## 2022-04-12 DIAGNOSIS — N186 End stage renal disease: Secondary | ICD-10-CM | POA: Diagnosis not present

## 2022-04-12 DIAGNOSIS — Z992 Dependence on renal dialysis: Secondary | ICD-10-CM | POA: Diagnosis not present

## 2022-04-14 DIAGNOSIS — N2581 Secondary hyperparathyroidism of renal origin: Secondary | ICD-10-CM | POA: Diagnosis not present

## 2022-04-14 DIAGNOSIS — N186 End stage renal disease: Secondary | ICD-10-CM | POA: Diagnosis not present

## 2022-04-14 DIAGNOSIS — Z992 Dependence on renal dialysis: Secondary | ICD-10-CM | POA: Diagnosis not present

## 2022-04-16 DIAGNOSIS — N186 End stage renal disease: Secondary | ICD-10-CM | POA: Diagnosis not present

## 2022-04-16 DIAGNOSIS — Z992 Dependence on renal dialysis: Secondary | ICD-10-CM | POA: Diagnosis not present

## 2022-04-16 DIAGNOSIS — N2581 Secondary hyperparathyroidism of renal origin: Secondary | ICD-10-CM | POA: Diagnosis not present

## 2022-04-19 DIAGNOSIS — Z992 Dependence on renal dialysis: Secondary | ICD-10-CM | POA: Diagnosis not present

## 2022-04-19 DIAGNOSIS — N186 End stage renal disease: Secondary | ICD-10-CM | POA: Diagnosis not present

## 2022-04-19 DIAGNOSIS — N2581 Secondary hyperparathyroidism of renal origin: Secondary | ICD-10-CM | POA: Diagnosis not present

## 2022-04-21 DIAGNOSIS — N186 End stage renal disease: Secondary | ICD-10-CM | POA: Diagnosis not present

## 2022-04-21 DIAGNOSIS — N2581 Secondary hyperparathyroidism of renal origin: Secondary | ICD-10-CM | POA: Diagnosis not present

## 2022-04-21 DIAGNOSIS — Z992 Dependence on renal dialysis: Secondary | ICD-10-CM | POA: Diagnosis not present

## 2022-04-23 DIAGNOSIS — Z992 Dependence on renal dialysis: Secondary | ICD-10-CM | POA: Diagnosis not present

## 2022-04-23 DIAGNOSIS — N186 End stage renal disease: Secondary | ICD-10-CM | POA: Diagnosis not present

## 2022-04-23 DIAGNOSIS — N2581 Secondary hyperparathyroidism of renal origin: Secondary | ICD-10-CM | POA: Diagnosis not present

## 2022-04-26 DIAGNOSIS — Z992 Dependence on renal dialysis: Secondary | ICD-10-CM | POA: Diagnosis not present

## 2022-04-26 DIAGNOSIS — N186 End stage renal disease: Secondary | ICD-10-CM | POA: Diagnosis not present

## 2022-04-26 DIAGNOSIS — N2581 Secondary hyperparathyroidism of renal origin: Secondary | ICD-10-CM | POA: Diagnosis not present

## 2022-04-28 DIAGNOSIS — N2581 Secondary hyperparathyroidism of renal origin: Secondary | ICD-10-CM | POA: Diagnosis not present

## 2022-04-28 DIAGNOSIS — Z992 Dependence on renal dialysis: Secondary | ICD-10-CM | POA: Diagnosis not present

## 2022-04-28 DIAGNOSIS — N186 End stage renal disease: Secondary | ICD-10-CM | POA: Diagnosis not present

## 2022-04-30 DIAGNOSIS — N2581 Secondary hyperparathyroidism of renal origin: Secondary | ICD-10-CM | POA: Diagnosis not present

## 2022-04-30 DIAGNOSIS — Z992 Dependence on renal dialysis: Secondary | ICD-10-CM | POA: Diagnosis not present

## 2022-04-30 DIAGNOSIS — N186 End stage renal disease: Secondary | ICD-10-CM | POA: Diagnosis not present

## 2022-05-02 DIAGNOSIS — Z992 Dependence on renal dialysis: Secondary | ICD-10-CM | POA: Diagnosis not present

## 2022-05-02 DIAGNOSIS — Q612 Polycystic kidney, adult type: Secondary | ICD-10-CM | POA: Diagnosis not present

## 2022-05-02 DIAGNOSIS — N186 End stage renal disease: Secondary | ICD-10-CM | POA: Diagnosis not present

## 2022-05-05 DIAGNOSIS — Z992 Dependence on renal dialysis: Secondary | ICD-10-CM | POA: Diagnosis not present

## 2022-05-05 DIAGNOSIS — D631 Anemia in chronic kidney disease: Secondary | ICD-10-CM | POA: Diagnosis not present

## 2022-05-05 DIAGNOSIS — N186 End stage renal disease: Secondary | ICD-10-CM | POA: Diagnosis not present

## 2022-05-05 DIAGNOSIS — N2581 Secondary hyperparathyroidism of renal origin: Secondary | ICD-10-CM | POA: Diagnosis not present

## 2022-05-07 DIAGNOSIS — N186 End stage renal disease: Secondary | ICD-10-CM | POA: Diagnosis not present

## 2022-05-07 DIAGNOSIS — D631 Anemia in chronic kidney disease: Secondary | ICD-10-CM | POA: Diagnosis not present

## 2022-05-07 DIAGNOSIS — Z992 Dependence on renal dialysis: Secondary | ICD-10-CM | POA: Diagnosis not present

## 2022-05-07 DIAGNOSIS — N2581 Secondary hyperparathyroidism of renal origin: Secondary | ICD-10-CM | POA: Diagnosis not present

## 2022-05-10 DIAGNOSIS — N2581 Secondary hyperparathyroidism of renal origin: Secondary | ICD-10-CM | POA: Diagnosis not present

## 2022-05-10 DIAGNOSIS — Z992 Dependence on renal dialysis: Secondary | ICD-10-CM | POA: Diagnosis not present

## 2022-05-10 DIAGNOSIS — D631 Anemia in chronic kidney disease: Secondary | ICD-10-CM | POA: Diagnosis not present

## 2022-05-10 DIAGNOSIS — N186 End stage renal disease: Secondary | ICD-10-CM | POA: Diagnosis not present

## 2022-05-12 DIAGNOSIS — N186 End stage renal disease: Secondary | ICD-10-CM | POA: Diagnosis not present

## 2022-05-12 DIAGNOSIS — N2581 Secondary hyperparathyroidism of renal origin: Secondary | ICD-10-CM | POA: Diagnosis not present

## 2022-05-12 DIAGNOSIS — D631 Anemia in chronic kidney disease: Secondary | ICD-10-CM | POA: Diagnosis not present

## 2022-05-12 DIAGNOSIS — Z992 Dependence on renal dialysis: Secondary | ICD-10-CM | POA: Diagnosis not present

## 2022-05-14 DIAGNOSIS — Z992 Dependence on renal dialysis: Secondary | ICD-10-CM | POA: Diagnosis not present

## 2022-05-14 DIAGNOSIS — D631 Anemia in chronic kidney disease: Secondary | ICD-10-CM | POA: Diagnosis not present

## 2022-05-14 DIAGNOSIS — N186 End stage renal disease: Secondary | ICD-10-CM | POA: Diagnosis not present

## 2022-05-14 DIAGNOSIS — N2581 Secondary hyperparathyroidism of renal origin: Secondary | ICD-10-CM | POA: Diagnosis not present

## 2022-05-17 DIAGNOSIS — N186 End stage renal disease: Secondary | ICD-10-CM | POA: Diagnosis not present

## 2022-05-17 DIAGNOSIS — N2581 Secondary hyperparathyroidism of renal origin: Secondary | ICD-10-CM | POA: Diagnosis not present

## 2022-05-17 DIAGNOSIS — Z992 Dependence on renal dialysis: Secondary | ICD-10-CM | POA: Diagnosis not present

## 2022-05-17 DIAGNOSIS — D631 Anemia in chronic kidney disease: Secondary | ICD-10-CM | POA: Diagnosis not present

## 2022-05-20 DIAGNOSIS — N2581 Secondary hyperparathyroidism of renal origin: Secondary | ICD-10-CM | POA: Diagnosis not present

## 2022-05-20 DIAGNOSIS — N186 End stage renal disease: Secondary | ICD-10-CM | POA: Diagnosis not present

## 2022-05-20 DIAGNOSIS — Z992 Dependence on renal dialysis: Secondary | ICD-10-CM | POA: Diagnosis not present

## 2022-05-20 DIAGNOSIS — D631 Anemia in chronic kidney disease: Secondary | ICD-10-CM | POA: Diagnosis not present

## 2022-05-21 DIAGNOSIS — N186 End stage renal disease: Secondary | ICD-10-CM | POA: Diagnosis not present

## 2022-05-21 DIAGNOSIS — N2581 Secondary hyperparathyroidism of renal origin: Secondary | ICD-10-CM | POA: Diagnosis not present

## 2022-05-21 DIAGNOSIS — D631 Anemia in chronic kidney disease: Secondary | ICD-10-CM | POA: Diagnosis not present

## 2022-05-21 DIAGNOSIS — Z992 Dependence on renal dialysis: Secondary | ICD-10-CM | POA: Diagnosis not present

## 2022-05-24 DIAGNOSIS — Z992 Dependence on renal dialysis: Secondary | ICD-10-CM | POA: Diagnosis not present

## 2022-05-24 DIAGNOSIS — N186 End stage renal disease: Secondary | ICD-10-CM | POA: Diagnosis not present

## 2022-05-24 DIAGNOSIS — N2581 Secondary hyperparathyroidism of renal origin: Secondary | ICD-10-CM | POA: Diagnosis not present

## 2022-05-24 DIAGNOSIS — D631 Anemia in chronic kidney disease: Secondary | ICD-10-CM | POA: Diagnosis not present

## 2022-05-26 DIAGNOSIS — N2581 Secondary hyperparathyroidism of renal origin: Secondary | ICD-10-CM | POA: Diagnosis not present

## 2022-05-26 DIAGNOSIS — D631 Anemia in chronic kidney disease: Secondary | ICD-10-CM | POA: Diagnosis not present

## 2022-05-26 DIAGNOSIS — N186 End stage renal disease: Secondary | ICD-10-CM | POA: Diagnosis not present

## 2022-05-26 DIAGNOSIS — Z992 Dependence on renal dialysis: Secondary | ICD-10-CM | POA: Diagnosis not present

## 2022-05-28 DIAGNOSIS — N2581 Secondary hyperparathyroidism of renal origin: Secondary | ICD-10-CM | POA: Diagnosis not present

## 2022-05-28 DIAGNOSIS — N186 End stage renal disease: Secondary | ICD-10-CM | POA: Diagnosis not present

## 2022-05-28 DIAGNOSIS — Z992 Dependence on renal dialysis: Secondary | ICD-10-CM | POA: Diagnosis not present

## 2022-05-28 DIAGNOSIS — D631 Anemia in chronic kidney disease: Secondary | ICD-10-CM | POA: Diagnosis not present

## 2022-05-31 DIAGNOSIS — H40021 Open angle with borderline findings, high risk, right eye: Secondary | ICD-10-CM | POA: Diagnosis not present

## 2022-05-31 DIAGNOSIS — N186 End stage renal disease: Secondary | ICD-10-CM | POA: Diagnosis not present

## 2022-05-31 DIAGNOSIS — N2581 Secondary hyperparathyroidism of renal origin: Secondary | ICD-10-CM | POA: Diagnosis not present

## 2022-05-31 DIAGNOSIS — H4052X4 Glaucoma secondary to other eye disorders, left eye, indeterminate stage: Secondary | ICD-10-CM | POA: Diagnosis not present

## 2022-05-31 DIAGNOSIS — D631 Anemia in chronic kidney disease: Secondary | ICD-10-CM | POA: Diagnosis not present

## 2022-05-31 DIAGNOSIS — Z992 Dependence on renal dialysis: Secondary | ICD-10-CM | POA: Diagnosis not present

## 2022-05-31 DIAGNOSIS — H25813 Combined forms of age-related cataract, bilateral: Secondary | ICD-10-CM | POA: Diagnosis not present

## 2022-06-02 DIAGNOSIS — D631 Anemia in chronic kidney disease: Secondary | ICD-10-CM | POA: Diagnosis not present

## 2022-06-02 DIAGNOSIS — Q612 Polycystic kidney, adult type: Secondary | ICD-10-CM | POA: Diagnosis not present

## 2022-06-02 DIAGNOSIS — N2581 Secondary hyperparathyroidism of renal origin: Secondary | ICD-10-CM | POA: Diagnosis not present

## 2022-06-02 DIAGNOSIS — Z992 Dependence on renal dialysis: Secondary | ICD-10-CM | POA: Diagnosis not present

## 2022-06-02 DIAGNOSIS — N186 End stage renal disease: Secondary | ICD-10-CM | POA: Diagnosis not present

## 2022-06-04 DIAGNOSIS — N186 End stage renal disease: Secondary | ICD-10-CM | POA: Diagnosis not present

## 2022-06-04 DIAGNOSIS — N2581 Secondary hyperparathyroidism of renal origin: Secondary | ICD-10-CM | POA: Diagnosis not present

## 2022-06-04 DIAGNOSIS — D631 Anemia in chronic kidney disease: Secondary | ICD-10-CM | POA: Diagnosis not present

## 2022-06-04 DIAGNOSIS — Z992 Dependence on renal dialysis: Secondary | ICD-10-CM | POA: Diagnosis not present

## 2022-06-07 DIAGNOSIS — N2581 Secondary hyperparathyroidism of renal origin: Secondary | ICD-10-CM | POA: Diagnosis not present

## 2022-06-07 DIAGNOSIS — Z992 Dependence on renal dialysis: Secondary | ICD-10-CM | POA: Diagnosis not present

## 2022-06-07 DIAGNOSIS — D631 Anemia in chronic kidney disease: Secondary | ICD-10-CM | POA: Diagnosis not present

## 2022-06-07 DIAGNOSIS — N186 End stage renal disease: Secondary | ICD-10-CM | POA: Diagnosis not present

## 2022-06-09 DIAGNOSIS — N186 End stage renal disease: Secondary | ICD-10-CM | POA: Diagnosis not present

## 2022-06-09 DIAGNOSIS — N2581 Secondary hyperparathyroidism of renal origin: Secondary | ICD-10-CM | POA: Diagnosis not present

## 2022-06-09 DIAGNOSIS — Z992 Dependence on renal dialysis: Secondary | ICD-10-CM | POA: Diagnosis not present

## 2022-06-09 DIAGNOSIS — D631 Anemia in chronic kidney disease: Secondary | ICD-10-CM | POA: Diagnosis not present

## 2022-06-13 DIAGNOSIS — Z992 Dependence on renal dialysis: Secondary | ICD-10-CM | POA: Diagnosis not present

## 2022-06-13 DIAGNOSIS — D631 Anemia in chronic kidney disease: Secondary | ICD-10-CM | POA: Diagnosis not present

## 2022-06-13 DIAGNOSIS — N186 End stage renal disease: Secondary | ICD-10-CM | POA: Diagnosis not present

## 2022-06-13 DIAGNOSIS — N2581 Secondary hyperparathyroidism of renal origin: Secondary | ICD-10-CM | POA: Diagnosis not present

## 2022-06-14 DIAGNOSIS — Z992 Dependence on renal dialysis: Secondary | ICD-10-CM | POA: Diagnosis not present

## 2022-06-14 DIAGNOSIS — D631 Anemia in chronic kidney disease: Secondary | ICD-10-CM | POA: Diagnosis not present

## 2022-06-14 DIAGNOSIS — N186 End stage renal disease: Secondary | ICD-10-CM | POA: Diagnosis not present

## 2022-06-14 DIAGNOSIS — N2581 Secondary hyperparathyroidism of renal origin: Secondary | ICD-10-CM | POA: Diagnosis not present

## 2022-06-16 DIAGNOSIS — N2581 Secondary hyperparathyroidism of renal origin: Secondary | ICD-10-CM | POA: Diagnosis not present

## 2022-06-16 DIAGNOSIS — D631 Anemia in chronic kidney disease: Secondary | ICD-10-CM | POA: Diagnosis not present

## 2022-06-16 DIAGNOSIS — Z992 Dependence on renal dialysis: Secondary | ICD-10-CM | POA: Diagnosis not present

## 2022-06-16 DIAGNOSIS — N186 End stage renal disease: Secondary | ICD-10-CM | POA: Diagnosis not present

## 2022-06-18 DIAGNOSIS — Z992 Dependence on renal dialysis: Secondary | ICD-10-CM | POA: Diagnosis not present

## 2022-06-18 DIAGNOSIS — D631 Anemia in chronic kidney disease: Secondary | ICD-10-CM | POA: Diagnosis not present

## 2022-06-18 DIAGNOSIS — N186 End stage renal disease: Secondary | ICD-10-CM | POA: Diagnosis not present

## 2022-06-18 DIAGNOSIS — N2581 Secondary hyperparathyroidism of renal origin: Secondary | ICD-10-CM | POA: Diagnosis not present

## 2022-06-21 DIAGNOSIS — H268 Other specified cataract: Secondary | ICD-10-CM | POA: Diagnosis not present

## 2022-06-21 DIAGNOSIS — H35033 Hypertensive retinopathy, bilateral: Secondary | ICD-10-CM | POA: Diagnosis not present

## 2022-06-21 DIAGNOSIS — H348122 Central retinal vein occlusion, left eye, stable: Secondary | ICD-10-CM | POA: Diagnosis not present

## 2022-06-21 DIAGNOSIS — H348312 Tributary (branch) retinal vein occlusion, right eye, stable: Secondary | ICD-10-CM | POA: Diagnosis not present

## 2022-06-21 DIAGNOSIS — H4089 Other specified glaucoma: Secondary | ICD-10-CM | POA: Diagnosis not present

## 2022-06-21 DIAGNOSIS — H211X2 Other vascular disorders of iris and ciliary body, left eye: Secondary | ICD-10-CM | POA: Diagnosis not present

## 2022-06-21 DIAGNOSIS — H25813 Combined forms of age-related cataract, bilateral: Secondary | ICD-10-CM | POA: Diagnosis not present

## 2022-06-21 DIAGNOSIS — H4052X4 Glaucoma secondary to other eye disorders, left eye, indeterminate stage: Secondary | ICD-10-CM | POA: Diagnosis not present

## 2022-06-21 DIAGNOSIS — H40021 Open angle with borderline findings, high risk, right eye: Secondary | ICD-10-CM | POA: Diagnosis not present

## 2022-06-21 DIAGNOSIS — H34831 Tributary (branch) retinal vein occlusion, right eye, with macular edema: Secondary | ICD-10-CM | POA: Diagnosis not present

## 2022-06-21 DIAGNOSIS — H348121 Central retinal vein occlusion, left eye, with retinal neovascularization: Secondary | ICD-10-CM | POA: Diagnosis not present

## 2022-06-22 ENCOUNTER — Other Ambulatory Visit (INDEPENDENT_AMBULATORY_CARE_PROVIDER_SITE_OTHER): Payer: Self-pay | Admitting: Ophthalmology

## 2022-06-22 DIAGNOSIS — N2581 Secondary hyperparathyroidism of renal origin: Secondary | ICD-10-CM | POA: Diagnosis not present

## 2022-06-22 DIAGNOSIS — N186 End stage renal disease: Secondary | ICD-10-CM | POA: Diagnosis not present

## 2022-06-22 DIAGNOSIS — D631 Anemia in chronic kidney disease: Secondary | ICD-10-CM | POA: Diagnosis not present

## 2022-06-22 DIAGNOSIS — Z992 Dependence on renal dialysis: Secondary | ICD-10-CM | POA: Diagnosis not present

## 2022-06-23 DIAGNOSIS — Z992 Dependence on renal dialysis: Secondary | ICD-10-CM | POA: Diagnosis not present

## 2022-06-23 DIAGNOSIS — N2581 Secondary hyperparathyroidism of renal origin: Secondary | ICD-10-CM | POA: Diagnosis not present

## 2022-06-23 DIAGNOSIS — D631 Anemia in chronic kidney disease: Secondary | ICD-10-CM | POA: Diagnosis not present

## 2022-06-23 DIAGNOSIS — N186 End stage renal disease: Secondary | ICD-10-CM | POA: Diagnosis not present

## 2022-06-25 DIAGNOSIS — N2581 Secondary hyperparathyroidism of renal origin: Secondary | ICD-10-CM | POA: Diagnosis not present

## 2022-06-25 DIAGNOSIS — N186 End stage renal disease: Secondary | ICD-10-CM | POA: Diagnosis not present

## 2022-06-25 DIAGNOSIS — Z992 Dependence on renal dialysis: Secondary | ICD-10-CM | POA: Diagnosis not present

## 2022-06-25 DIAGNOSIS — D631 Anemia in chronic kidney disease: Secondary | ICD-10-CM | POA: Diagnosis not present

## 2022-06-26 ENCOUNTER — Other Ambulatory Visit: Payer: Self-pay | Admitting: Internal Medicine

## 2022-06-26 DIAGNOSIS — I4891 Unspecified atrial fibrillation: Secondary | ICD-10-CM

## 2022-06-27 NOTE — Telephone Encounter (Signed)
Prescription refill request for Eliquis received. Indication: Afib  Last office visit: 12/03/21 Lovena Le)  Scr: 10.10 (08/16/21)  Age: 67 Weight: 85.3kg  Appropriate dose. Refill sent.

## 2022-06-28 DIAGNOSIS — N186 End stage renal disease: Secondary | ICD-10-CM | POA: Diagnosis not present

## 2022-06-28 DIAGNOSIS — D631 Anemia in chronic kidney disease: Secondary | ICD-10-CM | POA: Diagnosis not present

## 2022-06-28 DIAGNOSIS — Z992 Dependence on renal dialysis: Secondary | ICD-10-CM | POA: Diagnosis not present

## 2022-06-28 DIAGNOSIS — N2581 Secondary hyperparathyroidism of renal origin: Secondary | ICD-10-CM | POA: Diagnosis not present

## 2022-06-30 DIAGNOSIS — N186 End stage renal disease: Secondary | ICD-10-CM | POA: Diagnosis not present

## 2022-06-30 DIAGNOSIS — N2581 Secondary hyperparathyroidism of renal origin: Secondary | ICD-10-CM | POA: Diagnosis not present

## 2022-06-30 DIAGNOSIS — Z992 Dependence on renal dialysis: Secondary | ICD-10-CM | POA: Diagnosis not present

## 2022-06-30 DIAGNOSIS — D631 Anemia in chronic kidney disease: Secondary | ICD-10-CM | POA: Diagnosis not present

## 2022-07-01 DIAGNOSIS — Z992 Dependence on renal dialysis: Secondary | ICD-10-CM | POA: Diagnosis not present

## 2022-07-01 DIAGNOSIS — Q612 Polycystic kidney, adult type: Secondary | ICD-10-CM | POA: Diagnosis not present

## 2022-07-01 DIAGNOSIS — N186 End stage renal disease: Secondary | ICD-10-CM | POA: Diagnosis not present

## 2022-07-02 DIAGNOSIS — N2581 Secondary hyperparathyroidism of renal origin: Secondary | ICD-10-CM | POA: Diagnosis not present

## 2022-07-02 DIAGNOSIS — N186 End stage renal disease: Secondary | ICD-10-CM | POA: Diagnosis not present

## 2022-07-02 DIAGNOSIS — Z992 Dependence on renal dialysis: Secondary | ICD-10-CM | POA: Diagnosis not present

## 2022-07-02 DIAGNOSIS — D631 Anemia in chronic kidney disease: Secondary | ICD-10-CM | POA: Diagnosis not present

## 2022-07-05 DIAGNOSIS — Z992 Dependence on renal dialysis: Secondary | ICD-10-CM | POA: Diagnosis not present

## 2022-07-05 DIAGNOSIS — D631 Anemia in chronic kidney disease: Secondary | ICD-10-CM | POA: Diagnosis not present

## 2022-07-05 DIAGNOSIS — N2581 Secondary hyperparathyroidism of renal origin: Secondary | ICD-10-CM | POA: Diagnosis not present

## 2022-07-05 DIAGNOSIS — N186 End stage renal disease: Secondary | ICD-10-CM | POA: Diagnosis not present

## 2022-07-07 DIAGNOSIS — D631 Anemia in chronic kidney disease: Secondary | ICD-10-CM | POA: Diagnosis not present

## 2022-07-07 DIAGNOSIS — N186 End stage renal disease: Secondary | ICD-10-CM | POA: Diagnosis not present

## 2022-07-07 DIAGNOSIS — N2581 Secondary hyperparathyroidism of renal origin: Secondary | ICD-10-CM | POA: Diagnosis not present

## 2022-07-07 DIAGNOSIS — Z992 Dependence on renal dialysis: Secondary | ICD-10-CM | POA: Diagnosis not present

## 2022-07-08 ENCOUNTER — Other Ambulatory Visit (INDEPENDENT_AMBULATORY_CARE_PROVIDER_SITE_OTHER): Payer: Self-pay

## 2022-07-08 MED ORDER — DORZOLAMIDE HCL-TIMOLOL MAL 2-0.5 % OP SOLN: 1.0000 [drp] | Freq: Two times a day (BID) | OPHTHALMIC | 1 refills | Status: AC

## 2022-07-09 DIAGNOSIS — D631 Anemia in chronic kidney disease: Secondary | ICD-10-CM | POA: Diagnosis not present

## 2022-07-09 DIAGNOSIS — N2581 Secondary hyperparathyroidism of renal origin: Secondary | ICD-10-CM | POA: Diagnosis not present

## 2022-07-09 DIAGNOSIS — Z992 Dependence on renal dialysis: Secondary | ICD-10-CM | POA: Diagnosis not present

## 2022-07-09 DIAGNOSIS — N186 End stage renal disease: Secondary | ICD-10-CM | POA: Diagnosis not present

## 2022-07-12 DIAGNOSIS — N186 End stage renal disease: Secondary | ICD-10-CM | POA: Diagnosis not present

## 2022-07-12 DIAGNOSIS — Z992 Dependence on renal dialysis: Secondary | ICD-10-CM | POA: Diagnosis not present

## 2022-07-12 DIAGNOSIS — N2581 Secondary hyperparathyroidism of renal origin: Secondary | ICD-10-CM | POA: Diagnosis not present

## 2022-07-12 DIAGNOSIS — D631 Anemia in chronic kidney disease: Secondary | ICD-10-CM | POA: Diagnosis not present

## 2022-07-14 DIAGNOSIS — N2581 Secondary hyperparathyroidism of renal origin: Secondary | ICD-10-CM | POA: Diagnosis not present

## 2022-07-14 DIAGNOSIS — D631 Anemia in chronic kidney disease: Secondary | ICD-10-CM | POA: Diagnosis not present

## 2022-07-14 DIAGNOSIS — N186 End stage renal disease: Secondary | ICD-10-CM | POA: Diagnosis not present

## 2022-07-14 DIAGNOSIS — Z992 Dependence on renal dialysis: Secondary | ICD-10-CM | POA: Diagnosis not present

## 2022-07-16 DIAGNOSIS — D631 Anemia in chronic kidney disease: Secondary | ICD-10-CM | POA: Diagnosis not present

## 2022-07-16 DIAGNOSIS — Z992 Dependence on renal dialysis: Secondary | ICD-10-CM | POA: Diagnosis not present

## 2022-07-16 DIAGNOSIS — N2581 Secondary hyperparathyroidism of renal origin: Secondary | ICD-10-CM | POA: Diagnosis not present

## 2022-07-16 DIAGNOSIS — N186 End stage renal disease: Secondary | ICD-10-CM | POA: Diagnosis not present

## 2022-07-19 DIAGNOSIS — N186 End stage renal disease: Secondary | ICD-10-CM | POA: Diagnosis not present

## 2022-07-19 DIAGNOSIS — Z992 Dependence on renal dialysis: Secondary | ICD-10-CM | POA: Diagnosis not present

## 2022-07-19 DIAGNOSIS — D631 Anemia in chronic kidney disease: Secondary | ICD-10-CM | POA: Diagnosis not present

## 2022-07-19 DIAGNOSIS — N2581 Secondary hyperparathyroidism of renal origin: Secondary | ICD-10-CM | POA: Diagnosis not present

## 2022-07-20 NOTE — Progress Notes (Deleted)
Cardiology Office Note:    Date:  07/20/2022   ID:  Michael Humphrey., DOB [birth year], [name] [id number]  PCP:  [name], MD (Inactive)  [facility] Providers Cardiologist:  Early Osmond, MD Electrophysiologist:  Cristopher Peru, MD { Click to update primary MD,subspecialty MD or APP then REFRESH:1}  *** Referring MD: No ref. provider found   Chief Complaint:  No chief complaint on file. {Click here for Visit Info    :1}    History of Present Illness:   Michael Mcnemar. is a 67 y.o. male with  history of NICM, HTN, HLD, PAF, VT on amio, ESRD on HD. Last saw Dr. Ali Lowe 06/2021 and doing well. Saw Dr. Lovena Le 01/2022 and stable.      Echo 07/2021 no change LVEF <20%. See below.        Past Medical History:  Diagnosis Date   Atrial arrhythmia    atrial tachycardia with variable AV conduction versus atypical aflutter 01/10/19, rate control (02/06/19)   Cataract    Dyspnea    on exertion   ESRD (end stage renal disease) ([place])    Hemo- MWF, Polycystic kidney disease   Fatigue    History of kidney stones    removal of stone- cysto   Hyperlipidemia    Hyperparathyroidism, secondary renal ([place])    Hypertension    Hypoxemia 12/12/2013   Nonischemic cardiomyopathy ([place])    Er 25% 2015, 55 % 2013   OSA on CPAP    no longer using cpap   OSA on CPAP 03/24/2014   Pneumonia    2015ish   Prostate cancer [facility])    Ventricular tachycardia//Freq PVCs    Wears glasses    Current Medications: No outpatient medications have been marked as taking for the 07/27/22 encounter (Appointment) with Imogene Burn, PA-C.    Allergies:   Colchicine, Indapamide, Lipitor [atorvastatin], Viagra [sildenafil], and Zyloprim [allopurinol]   Social History   Tobacco Use   Smoking status: Never   Smokeless tobacco: Never  Vaping Use   Vaping Use: Never used  Substance Use Topics   Alcohol use: No    Alcohol/week: 0.0 standard drinks of alcohol   Drug use: No    Family Hx: The  patient's family history includes Heart disease in his mother; Hyperlipidemia in his mother; Hypertension in his mother; Kidney disease in his brother and father; Stroke in his father. There is no history of Amblyopia, Blindness, Cataracts, Diabetes, Glaucoma, Macular degeneration, Retinal detachment, Strabismus, Retinitis pigmentosa, Breast cancer, Prostate cancer, Colon cancer, or Pancreatic cancer.  ROS     Physical Exam:    VS:  There were no vitals taken for this visit.    Wt Readings from Last 3 Encounters:  12/03/21 188 lb (85.3 kg)  10/25/21 189 lb 9.6 oz (86 kg)  08/25/21 192 lb 4.8 oz (87.2 kg)    Physical Exam  GEN: Well nourished, well developed, in no acute distress  HEENT: normal  Neck: no JVD, carotid bruits, or masses Cardiac:RRR; no murmurs, rubs, or gallops  Respiratory:  clear to auscultation bilaterally, normal work of breathing GI: soft, nontender, nondistended, + BS Ext: without cyanosis, clubbing, or edema, Good distal pulses bilaterally MS: no deformity or atrophy  Skin: warm and dry, no rash Neuro:  Alert and Oriented x 3, Strength and sensation are intact Psych: euthymic mood, full affect        EKGs/Labs/Other Test Reviewed:    EKG:  EKG is *** ordered today.  The ekg ordered today demonstrates ***  Recent Labs: 08/11/2021: B Natriuretic Peptide 2,709.7 08/14/2021: ALT 6 08/16/2021: BUN 22; Creatinine, Ser 10.10; Potassium 4.2; Sodium 130 08/25/2021: Hemoglobin 11.3; Platelet Count 115   Recent Lipid Panel No results for input(s): "CHOL", "TRIG", "HDL", "VLDL", "LDLCALC", "LDLDIRECT" in the last 8760 hours.   Prior CV Studies: {Select studies to display:26339}    TTE 09-Aug-2021 demonstrates ejection fraction less than 20% with severe LV dilatation severe RV dysfunction with an estimated right ventricular systolic pressure of 54 mmHg IMPRESSIONS     1. No significant changes from previous echo in Dec. 2022.   2. Left ventricular ejection fraction,  by estimation, is <20%. The left  ventricle has severely decreased function. The left ventricle demonstrates  global hypokinesis. The left ventricular internal cavity size was severely  dilated. Left ventricular  diastolic parameters are consistent with Grade III diastolic dysfunction  (restrictive).   3. Right ventricular systolic function is severely reduced. The right  ventricular size is severely enlarged. There is moderately elevated  pulmonary artery systolic pressure. The estimated right ventricular  systolic pressure is 123456 mmHg.   4. Left atrial size was severely dilated.   5. Right atrial size was severely dilated.   6. The mitral valve is normal in structure. Trivial mitral valve  regurgitation. No evidence of mitral stenosis.   7. Tricuspid valve regurgitation is mild to moderate.   8. The aortic valve is tricuspid. Aortic valve regurgitation is mild. No  aortic stenosis is present.     Event monitor 2019/08/10 with normal sinus rhythm with sinus bradycardia, paroxysmal atrial fibrillation without pulmonary masses   Coronary angiography 2013-08-09 with widely patent coronary arteries  Risk Assessment/Calculations/Metrics:   {Does this patient have ATRIAL FIBRILLATION?:[phone]}     No BP recorded.  {Refresh Note OR Click here to enter BP  :1}***    ASSESSMENT & PLAN:   No problem-specific Assessment & Plan notes found for this encounter.   Nonischemic cardiomyopathy: The patient is doing very well.  He is on losartan, Toprol, and spironolactone and is well afterload reduced.  He is tolerating these medications.  We could consider changing to [facility] in the future but he did have issues with low blood pressures in the past.  We will have him follow-up in 9 months or earlier if needed.  He is not a candidate for an SGLT2 inhibitor.    Hypertension: His blood pressure is under very good control.  Continue current medications.    Hyperlipidemia: Continue Crestor; this is being  followed by the patient's PCP.    Atrial fibrillation: Continue Toprol and Eliquis.  VT controlled with amio followed by Dr. Lovena Le  End-stage renal disease: He is not a candidate for an SGLT2 inhibitor.        {Are you ordering a CV Procedure (e.g. stress test, cath, DCCV, TEE, etc)?   Press F2        :[id number]   Dispo:  No follow-ups on file.   Medication Adjustments/Labs and Tests Ordered: Current medicines are reviewed at length with the patient today.  Concerns regarding medicines are outlined above.  Tests Ordered: No orders of the defined types were placed in this encounter.  Medication Changes: No orders of the defined types were placed in this encounter.  Sumner Boast, PA-C  07/20/2022 8:57 AM    [facility] [address], [place], [place]  [address] Phone: [phone]; Fax: [phone]

## 2022-07-21 DIAGNOSIS — Z992 Dependence on renal dialysis: Secondary | ICD-10-CM | POA: Diagnosis not present

## 2022-07-21 DIAGNOSIS — N186 End stage renal disease: Secondary | ICD-10-CM | POA: Diagnosis not present

## 2022-07-21 DIAGNOSIS — D631 Anemia in chronic kidney disease: Secondary | ICD-10-CM | POA: Diagnosis not present

## 2022-07-21 DIAGNOSIS — N2581 Secondary hyperparathyroidism of renal origin: Secondary | ICD-10-CM | POA: Diagnosis not present

## 2022-07-23 DIAGNOSIS — D631 Anemia in chronic kidney disease: Secondary | ICD-10-CM | POA: Diagnosis not present

## 2022-07-23 DIAGNOSIS — N186 End stage renal disease: Secondary | ICD-10-CM | POA: Diagnosis not present

## 2022-07-23 DIAGNOSIS — Z992 Dependence on renal dialysis: Secondary | ICD-10-CM | POA: Diagnosis not present

## 2022-07-23 DIAGNOSIS — N2581 Secondary hyperparathyroidism of renal origin: Secondary | ICD-10-CM | POA: Diagnosis not present

## 2022-07-26 DIAGNOSIS — Z992 Dependence on renal dialysis: Secondary | ICD-10-CM | POA: Diagnosis not present

## 2022-07-26 DIAGNOSIS — D631 Anemia in chronic kidney disease: Secondary | ICD-10-CM | POA: Diagnosis not present

## 2022-07-26 DIAGNOSIS — N2581 Secondary hyperparathyroidism of renal origin: Secondary | ICD-10-CM | POA: Diagnosis not present

## 2022-07-26 DIAGNOSIS — N186 End stage renal disease: Secondary | ICD-10-CM | POA: Diagnosis not present

## 2022-07-27 ENCOUNTER — Ambulatory Visit: Payer: Medicare Other | Admitting: Physician Assistant

## 2022-07-30 DIAGNOSIS — N186 End stage renal disease: Secondary | ICD-10-CM | POA: Diagnosis not present

## 2022-07-30 DIAGNOSIS — Z992 Dependence on renal dialysis: Secondary | ICD-10-CM | POA: Diagnosis not present

## 2022-07-30 DIAGNOSIS — N2581 Secondary hyperparathyroidism of renal origin: Secondary | ICD-10-CM | POA: Diagnosis not present

## 2022-07-30 DIAGNOSIS — D631 Anemia in chronic kidney disease: Secondary | ICD-10-CM | POA: Diagnosis not present

## 2022-08-01 DIAGNOSIS — N186 End stage renal disease: Secondary | ICD-10-CM | POA: Diagnosis not present

## 2022-08-01 DIAGNOSIS — Q612 Polycystic kidney, adult type: Secondary | ICD-10-CM | POA: Diagnosis not present

## 2022-08-01 DIAGNOSIS — Z992 Dependence on renal dialysis: Secondary | ICD-10-CM | POA: Diagnosis not present

## 2022-08-02 DIAGNOSIS — D631 Anemia in chronic kidney disease: Secondary | ICD-10-CM | POA: Diagnosis not present

## 2022-08-02 DIAGNOSIS — N186 End stage renal disease: Secondary | ICD-10-CM | POA: Diagnosis not present

## 2022-08-02 DIAGNOSIS — Z992 Dependence on renal dialysis: Secondary | ICD-10-CM | POA: Diagnosis not present

## 2022-08-02 DIAGNOSIS — N2581 Secondary hyperparathyroidism of renal origin: Secondary | ICD-10-CM | POA: Diagnosis not present

## 2022-08-04 DIAGNOSIS — D631 Anemia in chronic kidney disease: Secondary | ICD-10-CM | POA: Diagnosis not present

## 2022-08-04 DIAGNOSIS — Z992 Dependence on renal dialysis: Secondary | ICD-10-CM | POA: Diagnosis not present

## 2022-08-04 DIAGNOSIS — N186 End stage renal disease: Secondary | ICD-10-CM | POA: Diagnosis not present

## 2022-08-04 DIAGNOSIS — N2581 Secondary hyperparathyroidism of renal origin: Secondary | ICD-10-CM | POA: Diagnosis not present

## 2022-08-06 DIAGNOSIS — Z992 Dependence on renal dialysis: Secondary | ICD-10-CM | POA: Diagnosis not present

## 2022-08-06 DIAGNOSIS — N186 End stage renal disease: Secondary | ICD-10-CM | POA: Diagnosis not present

## 2022-08-06 DIAGNOSIS — N2581 Secondary hyperparathyroidism of renal origin: Secondary | ICD-10-CM | POA: Diagnosis not present

## 2022-08-06 DIAGNOSIS — D631 Anemia in chronic kidney disease: Secondary | ICD-10-CM | POA: Diagnosis not present

## 2022-08-09 DIAGNOSIS — N2581 Secondary hyperparathyroidism of renal origin: Secondary | ICD-10-CM | POA: Diagnosis not present

## 2022-08-09 DIAGNOSIS — N186 End stage renal disease: Secondary | ICD-10-CM | POA: Diagnosis not present

## 2022-08-09 DIAGNOSIS — D631 Anemia in chronic kidney disease: Secondary | ICD-10-CM | POA: Diagnosis not present

## 2022-08-09 DIAGNOSIS — Z992 Dependence on renal dialysis: Secondary | ICD-10-CM | POA: Diagnosis not present

## 2022-08-11 DIAGNOSIS — N186 End stage renal disease: Secondary | ICD-10-CM | POA: Diagnosis not present

## 2022-08-11 DIAGNOSIS — D631 Anemia in chronic kidney disease: Secondary | ICD-10-CM | POA: Diagnosis not present

## 2022-08-11 DIAGNOSIS — N2581 Secondary hyperparathyroidism of renal origin: Secondary | ICD-10-CM | POA: Diagnosis not present

## 2022-08-11 DIAGNOSIS — Z992 Dependence on renal dialysis: Secondary | ICD-10-CM | POA: Diagnosis not present

## 2022-08-13 DIAGNOSIS — D631 Anemia in chronic kidney disease: Secondary | ICD-10-CM | POA: Diagnosis not present

## 2022-08-13 DIAGNOSIS — N186 End stage renal disease: Secondary | ICD-10-CM | POA: Diagnosis not present

## 2022-08-13 DIAGNOSIS — Z992 Dependence on renal dialysis: Secondary | ICD-10-CM | POA: Diagnosis not present

## 2022-08-13 DIAGNOSIS — N2581 Secondary hyperparathyroidism of renal origin: Secondary | ICD-10-CM | POA: Diagnosis not present

## 2022-08-15 DIAGNOSIS — C61 Malignant neoplasm of prostate: Secondary | ICD-10-CM | POA: Diagnosis not present

## 2022-08-16 DIAGNOSIS — D631 Anemia in chronic kidney disease: Secondary | ICD-10-CM | POA: Diagnosis not present

## 2022-08-16 DIAGNOSIS — H4052X4 Glaucoma secondary to other eye disorders, left eye, indeterminate stage: Secondary | ICD-10-CM | POA: Diagnosis not present

## 2022-08-16 DIAGNOSIS — H25813 Combined forms of age-related cataract, bilateral: Secondary | ICD-10-CM | POA: Diagnosis not present

## 2022-08-16 DIAGNOSIS — N186 End stage renal disease: Secondary | ICD-10-CM | POA: Diagnosis not present

## 2022-08-16 DIAGNOSIS — H40021 Open angle with borderline findings, high risk, right eye: Secondary | ICD-10-CM | POA: Diagnosis not present

## 2022-08-16 DIAGNOSIS — Z992 Dependence on renal dialysis: Secondary | ICD-10-CM | POA: Diagnosis not present

## 2022-08-16 DIAGNOSIS — H348121 Central retinal vein occlusion, left eye, with retinal neovascularization: Secondary | ICD-10-CM | POA: Diagnosis not present

## 2022-08-16 DIAGNOSIS — N2581 Secondary hyperparathyroidism of renal origin: Secondary | ICD-10-CM | POA: Diagnosis not present

## 2022-08-16 DIAGNOSIS — H34831 Tributary (branch) retinal vein occlusion, right eye, with macular edema: Secondary | ICD-10-CM | POA: Diagnosis not present

## 2022-08-18 DIAGNOSIS — N186 End stage renal disease: Secondary | ICD-10-CM | POA: Diagnosis not present

## 2022-08-18 DIAGNOSIS — Z992 Dependence on renal dialysis: Secondary | ICD-10-CM | POA: Diagnosis not present

## 2022-08-18 DIAGNOSIS — D631 Anemia in chronic kidney disease: Secondary | ICD-10-CM | POA: Diagnosis not present

## 2022-08-18 DIAGNOSIS — N2581 Secondary hyperparathyroidism of renal origin: Secondary | ICD-10-CM | POA: Diagnosis not present

## 2022-08-20 DIAGNOSIS — N2581 Secondary hyperparathyroidism of renal origin: Secondary | ICD-10-CM | POA: Diagnosis not present

## 2022-08-20 DIAGNOSIS — D631 Anemia in chronic kidney disease: Secondary | ICD-10-CM | POA: Diagnosis not present

## 2022-08-20 DIAGNOSIS — N186 End stage renal disease: Secondary | ICD-10-CM | POA: Diagnosis not present

## 2022-08-20 DIAGNOSIS — Z992 Dependence on renal dialysis: Secondary | ICD-10-CM | POA: Diagnosis not present

## 2022-08-23 DIAGNOSIS — D631 Anemia in chronic kidney disease: Secondary | ICD-10-CM | POA: Diagnosis not present

## 2022-08-23 DIAGNOSIS — Z992 Dependence on renal dialysis: Secondary | ICD-10-CM | POA: Diagnosis not present

## 2022-08-23 DIAGNOSIS — N2581 Secondary hyperparathyroidism of renal origin: Secondary | ICD-10-CM | POA: Diagnosis not present

## 2022-08-23 DIAGNOSIS — N186 End stage renal disease: Secondary | ICD-10-CM | POA: Diagnosis not present

## 2022-08-25 DIAGNOSIS — Z992 Dependence on renal dialysis: Secondary | ICD-10-CM | POA: Diagnosis not present

## 2022-08-25 DIAGNOSIS — N186 End stage renal disease: Secondary | ICD-10-CM | POA: Diagnosis not present

## 2022-08-25 DIAGNOSIS — D631 Anemia in chronic kidney disease: Secondary | ICD-10-CM | POA: Diagnosis not present

## 2022-08-25 DIAGNOSIS — N2581 Secondary hyperparathyroidism of renal origin: Secondary | ICD-10-CM | POA: Diagnosis not present

## 2022-08-30 DIAGNOSIS — N186 End stage renal disease: Secondary | ICD-10-CM | POA: Diagnosis not present

## 2022-08-30 DIAGNOSIS — N2581 Secondary hyperparathyroidism of renal origin: Secondary | ICD-10-CM | POA: Diagnosis not present

## 2022-08-30 DIAGNOSIS — D631 Anemia in chronic kidney disease: Secondary | ICD-10-CM | POA: Diagnosis not present

## 2022-08-30 DIAGNOSIS — Z992 Dependence on renal dialysis: Secondary | ICD-10-CM | POA: Diagnosis not present

## 2022-08-31 DIAGNOSIS — N186 End stage renal disease: Secondary | ICD-10-CM | POA: Diagnosis not present

## 2022-08-31 DIAGNOSIS — Q612 Polycystic kidney, adult type: Secondary | ICD-10-CM | POA: Diagnosis not present

## 2022-08-31 DIAGNOSIS — Z992 Dependence on renal dialysis: Secondary | ICD-10-CM | POA: Diagnosis not present

## 2022-09-01 DIAGNOSIS — D631 Anemia in chronic kidney disease: Secondary | ICD-10-CM | POA: Diagnosis not present

## 2022-09-01 DIAGNOSIS — Z992 Dependence on renal dialysis: Secondary | ICD-10-CM | POA: Diagnosis not present

## 2022-09-01 DIAGNOSIS — N2581 Secondary hyperparathyroidism of renal origin: Secondary | ICD-10-CM | POA: Diagnosis not present

## 2022-09-01 DIAGNOSIS — N186 End stage renal disease: Secondary | ICD-10-CM | POA: Diagnosis not present

## 2022-09-03 DIAGNOSIS — N186 End stage renal disease: Secondary | ICD-10-CM | POA: Diagnosis not present

## 2022-09-03 DIAGNOSIS — D631 Anemia in chronic kidney disease: Secondary | ICD-10-CM | POA: Diagnosis not present

## 2022-09-03 DIAGNOSIS — N2581 Secondary hyperparathyroidism of renal origin: Secondary | ICD-10-CM | POA: Diagnosis not present

## 2022-09-03 DIAGNOSIS — Z992 Dependence on renal dialysis: Secondary | ICD-10-CM | POA: Diagnosis not present

## 2022-09-06 DIAGNOSIS — Z992 Dependence on renal dialysis: Secondary | ICD-10-CM | POA: Diagnosis not present

## 2022-09-06 DIAGNOSIS — N186 End stage renal disease: Secondary | ICD-10-CM | POA: Diagnosis not present

## 2022-09-06 DIAGNOSIS — D631 Anemia in chronic kidney disease: Secondary | ICD-10-CM | POA: Diagnosis not present

## 2022-09-06 DIAGNOSIS — N2581 Secondary hyperparathyroidism of renal origin: Secondary | ICD-10-CM | POA: Diagnosis not present

## 2022-09-07 DIAGNOSIS — I428 Other cardiomyopathies: Secondary | ICD-10-CM | POA: Diagnosis not present

## 2022-09-07 DIAGNOSIS — I5042 Chronic combined systolic (congestive) and diastolic (congestive) heart failure: Secondary | ICD-10-CM | POA: Diagnosis not present

## 2022-09-07 DIAGNOSIS — Z23 Encounter for immunization: Secondary | ICD-10-CM | POA: Diagnosis not present

## 2022-09-07 DIAGNOSIS — Z1331 Encounter for screening for depression: Secondary | ICD-10-CM | POA: Diagnosis not present

## 2022-09-07 DIAGNOSIS — N2581 Secondary hyperparathyroidism of renal origin: Secondary | ICD-10-CM | POA: Diagnosis not present

## 2022-09-07 DIAGNOSIS — N186 End stage renal disease: Secondary | ICD-10-CM | POA: Diagnosis not present

## 2022-09-07 DIAGNOSIS — D696 Thrombocytopenia, unspecified: Secondary | ICD-10-CM | POA: Diagnosis not present

## 2022-09-07 DIAGNOSIS — Z992 Dependence on renal dialysis: Secondary | ICD-10-CM | POA: Diagnosis not present

## 2022-09-07 DIAGNOSIS — Z0000 Encounter for general adult medical examination without abnormal findings: Secondary | ICD-10-CM | POA: Diagnosis not present

## 2022-09-07 DIAGNOSIS — C61 Malignant neoplasm of prostate: Secondary | ICD-10-CM | POA: Diagnosis not present

## 2022-09-07 DIAGNOSIS — Q613 Polycystic kidney, unspecified: Secondary | ICD-10-CM | POA: Diagnosis not present

## 2022-09-07 DIAGNOSIS — E782 Mixed hyperlipidemia: Secondary | ICD-10-CM | POA: Diagnosis not present

## 2022-09-08 DIAGNOSIS — N2581 Secondary hyperparathyroidism of renal origin: Secondary | ICD-10-CM | POA: Diagnosis not present

## 2022-09-08 DIAGNOSIS — N186 End stage renal disease: Secondary | ICD-10-CM | POA: Diagnosis not present

## 2022-09-08 DIAGNOSIS — D631 Anemia in chronic kidney disease: Secondary | ICD-10-CM | POA: Diagnosis not present

## 2022-09-08 DIAGNOSIS — Z992 Dependence on renal dialysis: Secondary | ICD-10-CM | POA: Diagnosis not present

## 2022-09-10 DIAGNOSIS — Z992 Dependence on renal dialysis: Secondary | ICD-10-CM | POA: Diagnosis not present

## 2022-09-10 DIAGNOSIS — D631 Anemia in chronic kidney disease: Secondary | ICD-10-CM | POA: Diagnosis not present

## 2022-09-10 DIAGNOSIS — N186 End stage renal disease: Secondary | ICD-10-CM | POA: Diagnosis not present

## 2022-09-10 DIAGNOSIS — N2581 Secondary hyperparathyroidism of renal origin: Secondary | ICD-10-CM | POA: Diagnosis not present

## 2022-09-12 DIAGNOSIS — I5022 Chronic systolic (congestive) heart failure: Secondary | ICD-10-CM | POA: Diagnosis not present

## 2022-09-12 DIAGNOSIS — H25812 Combined forms of age-related cataract, left eye: Secondary | ICD-10-CM | POA: Diagnosis not present

## 2022-09-12 DIAGNOSIS — N186 End stage renal disease: Secondary | ICD-10-CM | POA: Diagnosis not present

## 2022-09-12 DIAGNOSIS — Z7901 Long term (current) use of anticoagulants: Secondary | ICD-10-CM | POA: Diagnosis not present

## 2022-09-12 DIAGNOSIS — I252 Old myocardial infarction: Secondary | ICD-10-CM | POA: Diagnosis not present

## 2022-09-12 DIAGNOSIS — H4052X4 Glaucoma secondary to other eye disorders, left eye, indeterminate stage: Secondary | ICD-10-CM | POA: Diagnosis not present

## 2022-09-12 DIAGNOSIS — H4052X3 Glaucoma secondary to other eye disorders, left eye, severe stage: Secondary | ICD-10-CM | POA: Diagnosis not present

## 2022-09-12 DIAGNOSIS — I110 Hypertensive heart disease with heart failure: Secondary | ICD-10-CM | POA: Diagnosis not present

## 2022-09-12 DIAGNOSIS — Z992 Dependence on renal dialysis: Secondary | ICD-10-CM | POA: Diagnosis not present

## 2022-09-12 DIAGNOSIS — Q612 Polycystic kidney, adult type: Secondary | ICD-10-CM | POA: Diagnosis not present

## 2022-09-12 DIAGNOSIS — Z79899 Other long term (current) drug therapy: Secondary | ICD-10-CM | POA: Diagnosis not present

## 2022-09-12 DIAGNOSIS — H211X2 Other vascular disorders of iris and ciliary body, left eye: Secondary | ICD-10-CM | POA: Diagnosis not present

## 2022-09-15 DIAGNOSIS — N2581 Secondary hyperparathyroidism of renal origin: Secondary | ICD-10-CM | POA: Diagnosis not present

## 2022-09-15 DIAGNOSIS — N186 End stage renal disease: Secondary | ICD-10-CM | POA: Diagnosis not present

## 2022-09-15 DIAGNOSIS — Z992 Dependence on renal dialysis: Secondary | ICD-10-CM | POA: Diagnosis not present

## 2022-09-15 DIAGNOSIS — D631 Anemia in chronic kidney disease: Secondary | ICD-10-CM | POA: Diagnosis not present

## 2022-09-17 DIAGNOSIS — N2581 Secondary hyperparathyroidism of renal origin: Secondary | ICD-10-CM | POA: Diagnosis not present

## 2022-09-17 DIAGNOSIS — N186 End stage renal disease: Secondary | ICD-10-CM | POA: Diagnosis not present

## 2022-09-17 DIAGNOSIS — Z992 Dependence on renal dialysis: Secondary | ICD-10-CM | POA: Diagnosis not present

## 2022-09-17 DIAGNOSIS — D631 Anemia in chronic kidney disease: Secondary | ICD-10-CM | POA: Diagnosis not present

## 2022-09-20 DIAGNOSIS — Z992 Dependence on renal dialysis: Secondary | ICD-10-CM | POA: Diagnosis not present

## 2022-09-20 DIAGNOSIS — H4089 Other specified glaucoma: Secondary | ICD-10-CM | POA: Diagnosis not present

## 2022-09-20 DIAGNOSIS — D631 Anemia in chronic kidney disease: Secondary | ICD-10-CM | POA: Diagnosis not present

## 2022-09-20 DIAGNOSIS — Z9842 Cataract extraction status, left eye: Secondary | ICD-10-CM | POA: Diagnosis not present

## 2022-09-20 DIAGNOSIS — H4052X4 Glaucoma secondary to other eye disorders, left eye, indeterminate stage: Secondary | ICD-10-CM | POA: Diagnosis not present

## 2022-09-20 DIAGNOSIS — H269 Unspecified cataract: Secondary | ICD-10-CM | POA: Diagnosis not present

## 2022-09-20 DIAGNOSIS — H348312 Tributary (branch) retinal vein occlusion, right eye, stable: Secondary | ICD-10-CM | POA: Diagnosis not present

## 2022-09-20 DIAGNOSIS — H348121 Central retinal vein occlusion, left eye, with retinal neovascularization: Secondary | ICD-10-CM | POA: Diagnosis not present

## 2022-09-20 DIAGNOSIS — H35033 Hypertensive retinopathy, bilateral: Secondary | ICD-10-CM | POA: Diagnosis not present

## 2022-09-20 DIAGNOSIS — N186 End stage renal disease: Secondary | ICD-10-CM | POA: Diagnosis not present

## 2022-09-20 DIAGNOSIS — N2581 Secondary hyperparathyroidism of renal origin: Secondary | ICD-10-CM | POA: Diagnosis not present

## 2022-09-22 DIAGNOSIS — N2581 Secondary hyperparathyroidism of renal origin: Secondary | ICD-10-CM | POA: Diagnosis not present

## 2022-09-22 DIAGNOSIS — Z992 Dependence on renal dialysis: Secondary | ICD-10-CM | POA: Diagnosis not present

## 2022-09-22 DIAGNOSIS — D631 Anemia in chronic kidney disease: Secondary | ICD-10-CM | POA: Diagnosis not present

## 2022-09-22 DIAGNOSIS — N186 End stage renal disease: Secondary | ICD-10-CM | POA: Diagnosis not present

## 2022-09-24 DIAGNOSIS — Z992 Dependence on renal dialysis: Secondary | ICD-10-CM | POA: Diagnosis not present

## 2022-09-24 DIAGNOSIS — N2581 Secondary hyperparathyroidism of renal origin: Secondary | ICD-10-CM | POA: Diagnosis not present

## 2022-09-24 DIAGNOSIS — D631 Anemia in chronic kidney disease: Secondary | ICD-10-CM | POA: Diagnosis not present

## 2022-09-24 DIAGNOSIS — N186 End stage renal disease: Secondary | ICD-10-CM | POA: Diagnosis not present

## 2022-09-27 DIAGNOSIS — N2581 Secondary hyperparathyroidism of renal origin: Secondary | ICD-10-CM | POA: Diagnosis not present

## 2022-09-27 DIAGNOSIS — D631 Anemia in chronic kidney disease: Secondary | ICD-10-CM | POA: Diagnosis not present

## 2022-09-27 DIAGNOSIS — N186 End stage renal disease: Secondary | ICD-10-CM | POA: Diagnosis not present

## 2022-09-27 DIAGNOSIS — Z992 Dependence on renal dialysis: Secondary | ICD-10-CM | POA: Diagnosis not present

## 2022-09-29 DIAGNOSIS — N186 End stage renal disease: Secondary | ICD-10-CM | POA: Diagnosis not present

## 2022-09-29 DIAGNOSIS — D631 Anemia in chronic kidney disease: Secondary | ICD-10-CM | POA: Diagnosis not present

## 2022-09-29 DIAGNOSIS — N2581 Secondary hyperparathyroidism of renal origin: Secondary | ICD-10-CM | POA: Diagnosis not present

## 2022-09-29 DIAGNOSIS — Z992 Dependence on renal dialysis: Secondary | ICD-10-CM | POA: Diagnosis not present

## 2022-09-30 DIAGNOSIS — Z992 Dependence on renal dialysis: Secondary | ICD-10-CM | POA: Diagnosis not present

## 2022-09-30 DIAGNOSIS — N186 End stage renal disease: Secondary | ICD-10-CM | POA: Diagnosis not present

## 2022-09-30 DIAGNOSIS — I871 Compression of vein: Secondary | ICD-10-CM | POA: Diagnosis not present

## 2022-09-30 DIAGNOSIS — T82898A Other specified complication of vascular prosthetic devices, implants and grafts, initial encounter: Secondary | ICD-10-CM | POA: Diagnosis not present

## 2022-10-01 DIAGNOSIS — Q612 Polycystic kidney, adult type: Secondary | ICD-10-CM | POA: Diagnosis not present

## 2022-10-01 DIAGNOSIS — N186 End stage renal disease: Secondary | ICD-10-CM | POA: Diagnosis not present

## 2022-10-01 DIAGNOSIS — N2581 Secondary hyperparathyroidism of renal origin: Secondary | ICD-10-CM | POA: Diagnosis not present

## 2022-10-01 DIAGNOSIS — Z992 Dependence on renal dialysis: Secondary | ICD-10-CM | POA: Diagnosis not present

## 2022-10-01 DIAGNOSIS — D631 Anemia in chronic kidney disease: Secondary | ICD-10-CM | POA: Diagnosis not present

## 2022-10-04 DIAGNOSIS — H348121 Central retinal vein occlusion, left eye, with retinal neovascularization: Secondary | ICD-10-CM | POA: Diagnosis not present

## 2022-10-06 DIAGNOSIS — D631 Anemia in chronic kidney disease: Secondary | ICD-10-CM | POA: Diagnosis not present

## 2022-10-06 DIAGNOSIS — Z992 Dependence on renal dialysis: Secondary | ICD-10-CM | POA: Diagnosis not present

## 2022-10-06 DIAGNOSIS — N2581 Secondary hyperparathyroidism of renal origin: Secondary | ICD-10-CM | POA: Diagnosis not present

## 2022-10-06 DIAGNOSIS — N186 End stage renal disease: Secondary | ICD-10-CM | POA: Diagnosis not present

## 2022-10-08 DIAGNOSIS — D631 Anemia in chronic kidney disease: Secondary | ICD-10-CM | POA: Diagnosis not present

## 2022-10-08 DIAGNOSIS — N186 End stage renal disease: Secondary | ICD-10-CM | POA: Diagnosis not present

## 2022-10-08 DIAGNOSIS — Z992 Dependence on renal dialysis: Secondary | ICD-10-CM | POA: Diagnosis not present

## 2022-10-08 DIAGNOSIS — N2581 Secondary hyperparathyroidism of renal origin: Secondary | ICD-10-CM | POA: Diagnosis not present

## 2022-10-11 DIAGNOSIS — N186 End stage renal disease: Secondary | ICD-10-CM | POA: Diagnosis not present

## 2022-10-11 DIAGNOSIS — D631 Anemia in chronic kidney disease: Secondary | ICD-10-CM | POA: Diagnosis not present

## 2022-10-11 DIAGNOSIS — N2581 Secondary hyperparathyroidism of renal origin: Secondary | ICD-10-CM | POA: Diagnosis not present

## 2022-10-11 DIAGNOSIS — Z992 Dependence on renal dialysis: Secondary | ICD-10-CM | POA: Diagnosis not present

## 2022-10-13 DIAGNOSIS — N186 End stage renal disease: Secondary | ICD-10-CM | POA: Diagnosis not present

## 2022-10-13 DIAGNOSIS — N2581 Secondary hyperparathyroidism of renal origin: Secondary | ICD-10-CM | POA: Diagnosis not present

## 2022-10-13 DIAGNOSIS — Z992 Dependence on renal dialysis: Secondary | ICD-10-CM | POA: Diagnosis not present

## 2022-10-13 DIAGNOSIS — D631 Anemia in chronic kidney disease: Secondary | ICD-10-CM | POA: Diagnosis not present

## 2022-10-15 DIAGNOSIS — N2581 Secondary hyperparathyroidism of renal origin: Secondary | ICD-10-CM | POA: Diagnosis not present

## 2022-10-15 DIAGNOSIS — D631 Anemia in chronic kidney disease: Secondary | ICD-10-CM | POA: Diagnosis not present

## 2022-10-15 DIAGNOSIS — Z992 Dependence on renal dialysis: Secondary | ICD-10-CM | POA: Diagnosis not present

## 2022-10-15 DIAGNOSIS — N186 End stage renal disease: Secondary | ICD-10-CM | POA: Diagnosis not present

## 2022-10-18 DIAGNOSIS — N2581 Secondary hyperparathyroidism of renal origin: Secondary | ICD-10-CM | POA: Diagnosis not present

## 2022-10-18 DIAGNOSIS — Z992 Dependence on renal dialysis: Secondary | ICD-10-CM | POA: Diagnosis not present

## 2022-10-18 DIAGNOSIS — D631 Anemia in chronic kidney disease: Secondary | ICD-10-CM | POA: Diagnosis not present

## 2022-10-18 DIAGNOSIS — N186 End stage renal disease: Secondary | ICD-10-CM | POA: Diagnosis not present

## 2022-10-20 DIAGNOSIS — D631 Anemia in chronic kidney disease: Secondary | ICD-10-CM | POA: Diagnosis not present

## 2022-10-20 DIAGNOSIS — N186 End stage renal disease: Secondary | ICD-10-CM | POA: Diagnosis not present

## 2022-10-20 DIAGNOSIS — Z992 Dependence on renal dialysis: Secondary | ICD-10-CM | POA: Diagnosis not present

## 2022-10-20 DIAGNOSIS — N2581 Secondary hyperparathyroidism of renal origin: Secondary | ICD-10-CM | POA: Diagnosis not present

## 2022-10-22 DIAGNOSIS — N2581 Secondary hyperparathyroidism of renal origin: Secondary | ICD-10-CM | POA: Diagnosis not present

## 2022-10-22 DIAGNOSIS — Z992 Dependence on renal dialysis: Secondary | ICD-10-CM | POA: Diagnosis not present

## 2022-10-22 DIAGNOSIS — N186 End stage renal disease: Secondary | ICD-10-CM | POA: Diagnosis not present

## 2022-10-22 DIAGNOSIS — D631 Anemia in chronic kidney disease: Secondary | ICD-10-CM | POA: Diagnosis not present

## 2022-10-23 ENCOUNTER — Other Ambulatory Visit (INDEPENDENT_AMBULATORY_CARE_PROVIDER_SITE_OTHER): Payer: Self-pay | Admitting: Ophthalmology

## 2022-10-25 DIAGNOSIS — Z992 Dependence on renal dialysis: Secondary | ICD-10-CM | POA: Diagnosis not present

## 2022-10-25 DIAGNOSIS — N186 End stage renal disease: Secondary | ICD-10-CM | POA: Diagnosis not present

## 2022-10-25 DIAGNOSIS — D631 Anemia in chronic kidney disease: Secondary | ICD-10-CM | POA: Diagnosis not present

## 2022-10-25 DIAGNOSIS — N2581 Secondary hyperparathyroidism of renal origin: Secondary | ICD-10-CM | POA: Diagnosis not present

## 2022-10-29 DIAGNOSIS — Z992 Dependence on renal dialysis: Secondary | ICD-10-CM | POA: Diagnosis not present

## 2022-10-29 DIAGNOSIS — D631 Anemia in chronic kidney disease: Secondary | ICD-10-CM | POA: Diagnosis not present

## 2022-10-29 DIAGNOSIS — N2581 Secondary hyperparathyroidism of renal origin: Secondary | ICD-10-CM | POA: Diagnosis not present

## 2022-10-29 DIAGNOSIS — N186 End stage renal disease: Secondary | ICD-10-CM | POA: Diagnosis not present

## 2022-10-31 DIAGNOSIS — Z992 Dependence on renal dialysis: Secondary | ICD-10-CM | POA: Diagnosis not present

## 2022-10-31 DIAGNOSIS — Q612 Polycystic kidney, adult type: Secondary | ICD-10-CM | POA: Diagnosis not present

## 2022-10-31 DIAGNOSIS — N186 End stage renal disease: Secondary | ICD-10-CM | POA: Diagnosis not present

## 2022-11-01 DIAGNOSIS — Z992 Dependence on renal dialysis: Secondary | ICD-10-CM | POA: Diagnosis not present

## 2022-11-01 DIAGNOSIS — N2581 Secondary hyperparathyroidism of renal origin: Secondary | ICD-10-CM | POA: Diagnosis not present

## 2022-11-01 DIAGNOSIS — N186 End stage renal disease: Secondary | ICD-10-CM | POA: Diagnosis not present

## 2022-11-01 DIAGNOSIS — D631 Anemia in chronic kidney disease: Secondary | ICD-10-CM | POA: Diagnosis not present

## 2022-11-03 DIAGNOSIS — N2581 Secondary hyperparathyroidism of renal origin: Secondary | ICD-10-CM | POA: Diagnosis not present

## 2022-11-03 DIAGNOSIS — D631 Anemia in chronic kidney disease: Secondary | ICD-10-CM | POA: Diagnosis not present

## 2022-11-03 DIAGNOSIS — N186 End stage renal disease: Secondary | ICD-10-CM | POA: Diagnosis not present

## 2022-11-03 DIAGNOSIS — Z992 Dependence on renal dialysis: Secondary | ICD-10-CM | POA: Diagnosis not present

## 2022-11-08 DIAGNOSIS — N2581 Secondary hyperparathyroidism of renal origin: Secondary | ICD-10-CM | POA: Diagnosis not present

## 2022-11-08 DIAGNOSIS — N186 End stage renal disease: Secondary | ICD-10-CM | POA: Diagnosis not present

## 2022-11-08 DIAGNOSIS — Z992 Dependence on renal dialysis: Secondary | ICD-10-CM | POA: Diagnosis not present

## 2022-11-08 DIAGNOSIS — D631 Anemia in chronic kidney disease: Secondary | ICD-10-CM | POA: Diagnosis not present

## 2022-11-10 DIAGNOSIS — D631 Anemia in chronic kidney disease: Secondary | ICD-10-CM | POA: Diagnosis not present

## 2022-11-10 DIAGNOSIS — N186 End stage renal disease: Secondary | ICD-10-CM | POA: Diagnosis not present

## 2022-11-10 DIAGNOSIS — N2581 Secondary hyperparathyroidism of renal origin: Secondary | ICD-10-CM | POA: Diagnosis not present

## 2022-11-10 DIAGNOSIS — Z992 Dependence on renal dialysis: Secondary | ICD-10-CM | POA: Diagnosis not present

## 2022-11-12 DIAGNOSIS — Z992 Dependence on renal dialysis: Secondary | ICD-10-CM | POA: Diagnosis not present

## 2022-11-12 DIAGNOSIS — N2581 Secondary hyperparathyroidism of renal origin: Secondary | ICD-10-CM | POA: Diagnosis not present

## 2022-11-12 DIAGNOSIS — N186 End stage renal disease: Secondary | ICD-10-CM | POA: Diagnosis not present

## 2022-11-12 DIAGNOSIS — D631 Anemia in chronic kidney disease: Secondary | ICD-10-CM | POA: Diagnosis not present

## 2022-11-15 DIAGNOSIS — Z992 Dependence on renal dialysis: Secondary | ICD-10-CM | POA: Diagnosis not present

## 2022-11-15 DIAGNOSIS — N2581 Secondary hyperparathyroidism of renal origin: Secondary | ICD-10-CM | POA: Diagnosis not present

## 2022-11-15 DIAGNOSIS — D631 Anemia in chronic kidney disease: Secondary | ICD-10-CM | POA: Diagnosis not present

## 2022-11-15 DIAGNOSIS — N186 End stage renal disease: Secondary | ICD-10-CM | POA: Diagnosis not present

## 2022-11-17 DIAGNOSIS — Z992 Dependence on renal dialysis: Secondary | ICD-10-CM | POA: Diagnosis not present

## 2022-11-17 DIAGNOSIS — N186 End stage renal disease: Secondary | ICD-10-CM | POA: Diagnosis not present

## 2022-11-17 DIAGNOSIS — D631 Anemia in chronic kidney disease: Secondary | ICD-10-CM | POA: Diagnosis not present

## 2022-11-17 DIAGNOSIS — N2581 Secondary hyperparathyroidism of renal origin: Secondary | ICD-10-CM | POA: Diagnosis not present

## 2022-11-19 DIAGNOSIS — N186 End stage renal disease: Secondary | ICD-10-CM | POA: Diagnosis not present

## 2022-11-19 DIAGNOSIS — N2581 Secondary hyperparathyroidism of renal origin: Secondary | ICD-10-CM | POA: Diagnosis not present

## 2022-11-19 DIAGNOSIS — Z992 Dependence on renal dialysis: Secondary | ICD-10-CM | POA: Diagnosis not present

## 2022-11-19 DIAGNOSIS — D631 Anemia in chronic kidney disease: Secondary | ICD-10-CM | POA: Diagnosis not present

## 2022-11-22 DIAGNOSIS — Z992 Dependence on renal dialysis: Secondary | ICD-10-CM | POA: Diagnosis not present

## 2022-11-22 DIAGNOSIS — D631 Anemia in chronic kidney disease: Secondary | ICD-10-CM | POA: Diagnosis not present

## 2022-11-22 DIAGNOSIS — N186 End stage renal disease: Secondary | ICD-10-CM | POA: Diagnosis not present

## 2022-11-22 DIAGNOSIS — N2581 Secondary hyperparathyroidism of renal origin: Secondary | ICD-10-CM | POA: Diagnosis not present

## 2022-11-24 DIAGNOSIS — N2581 Secondary hyperparathyroidism of renal origin: Secondary | ICD-10-CM | POA: Diagnosis not present

## 2022-11-24 DIAGNOSIS — N186 End stage renal disease: Secondary | ICD-10-CM | POA: Diagnosis not present

## 2022-11-24 DIAGNOSIS — D631 Anemia in chronic kidney disease: Secondary | ICD-10-CM | POA: Diagnosis not present

## 2022-11-24 DIAGNOSIS — Z992 Dependence on renal dialysis: Secondary | ICD-10-CM | POA: Diagnosis not present

## 2022-11-26 DIAGNOSIS — Z992 Dependence on renal dialysis: Secondary | ICD-10-CM | POA: Diagnosis not present

## 2022-11-26 DIAGNOSIS — D631 Anemia in chronic kidney disease: Secondary | ICD-10-CM | POA: Diagnosis not present

## 2022-11-26 DIAGNOSIS — N2581 Secondary hyperparathyroidism of renal origin: Secondary | ICD-10-CM | POA: Diagnosis not present

## 2022-11-26 DIAGNOSIS — N186 End stage renal disease: Secondary | ICD-10-CM | POA: Diagnosis not present

## 2022-11-29 DIAGNOSIS — N186 End stage renal disease: Secondary | ICD-10-CM | POA: Diagnosis not present

## 2022-11-29 DIAGNOSIS — Z992 Dependence on renal dialysis: Secondary | ICD-10-CM | POA: Diagnosis not present

## 2022-11-29 DIAGNOSIS — D631 Anemia in chronic kidney disease: Secondary | ICD-10-CM | POA: Diagnosis not present

## 2022-11-29 DIAGNOSIS — N2581 Secondary hyperparathyroidism of renal origin: Secondary | ICD-10-CM | POA: Diagnosis not present

## 2022-12-01 DIAGNOSIS — N186 End stage renal disease: Secondary | ICD-10-CM | POA: Diagnosis not present

## 2022-12-01 DIAGNOSIS — Q612 Polycystic kidney, adult type: Secondary | ICD-10-CM | POA: Diagnosis not present

## 2022-12-01 DIAGNOSIS — Z992 Dependence on renal dialysis: Secondary | ICD-10-CM | POA: Diagnosis not present

## 2022-12-01 DIAGNOSIS — D631 Anemia in chronic kidney disease: Secondary | ICD-10-CM | POA: Diagnosis not present

## 2022-12-01 DIAGNOSIS — N2581 Secondary hyperparathyroidism of renal origin: Secondary | ICD-10-CM | POA: Diagnosis not present

## 2022-12-03 DIAGNOSIS — N2581 Secondary hyperparathyroidism of renal origin: Secondary | ICD-10-CM | POA: Diagnosis not present

## 2022-12-03 DIAGNOSIS — D631 Anemia in chronic kidney disease: Secondary | ICD-10-CM | POA: Diagnosis not present

## 2022-12-03 DIAGNOSIS — N186 End stage renal disease: Secondary | ICD-10-CM | POA: Diagnosis not present

## 2022-12-03 DIAGNOSIS — Z992 Dependence on renal dialysis: Secondary | ICD-10-CM | POA: Diagnosis not present

## 2022-12-06 DIAGNOSIS — N2581 Secondary hyperparathyroidism of renal origin: Secondary | ICD-10-CM | POA: Diagnosis not present

## 2022-12-06 DIAGNOSIS — Z992 Dependence on renal dialysis: Secondary | ICD-10-CM | POA: Diagnosis not present

## 2022-12-06 DIAGNOSIS — N186 End stage renal disease: Secondary | ICD-10-CM | POA: Diagnosis not present

## 2022-12-06 DIAGNOSIS — D631 Anemia in chronic kidney disease: Secondary | ICD-10-CM | POA: Diagnosis not present

## 2022-12-09 DIAGNOSIS — D631 Anemia in chronic kidney disease: Secondary | ICD-10-CM | POA: Diagnosis not present

## 2022-12-09 DIAGNOSIS — Z992 Dependence on renal dialysis: Secondary | ICD-10-CM | POA: Diagnosis not present

## 2022-12-09 DIAGNOSIS — N2581 Secondary hyperparathyroidism of renal origin: Secondary | ICD-10-CM | POA: Diagnosis not present

## 2022-12-09 DIAGNOSIS — N186 End stage renal disease: Secondary | ICD-10-CM | POA: Diagnosis not present

## 2022-12-10 DIAGNOSIS — D631 Anemia in chronic kidney disease: Secondary | ICD-10-CM | POA: Diagnosis not present

## 2022-12-10 DIAGNOSIS — N2581 Secondary hyperparathyroidism of renal origin: Secondary | ICD-10-CM | POA: Diagnosis not present

## 2022-12-10 DIAGNOSIS — Z992 Dependence on renal dialysis: Secondary | ICD-10-CM | POA: Diagnosis not present

## 2022-12-10 DIAGNOSIS — N186 End stage renal disease: Secondary | ICD-10-CM | POA: Diagnosis not present

## 2022-12-11 NOTE — Progress Notes (Deleted)
Cardiology Office Note Date:  12/11/2022  Patient ID:  Michael Bruster., DOB 08/31/1955, [name] [id number] PCP:  Michael Else, MD (Inactive)  Cardiologist:  Dr. Delton [name] >> Dr. Lynnette [name] Electrophysiologist: Dr. Ladona [name] Nephrology: Michael [name]    Chief Complaint:  *** annual visit   History of Present Illness: Michael Beri. is a 67 y.o. male with history of NICM, chronic CHF, VT/cardiac arrest, AFib, PCKD > ESRF on HD, HTN, HLD, OSA w/CPAP  Michael [name] [facility] saw [facility] in [facility] after an admission for symptomatic, sustained VT. Echo at that time showed EF 20 to 25%.  LHC performed which showed normal coronary arteries. He was started on amiodarone with improvement and had no further VT. At follow-up repeat echo showed EF of 25 to 30%.  Nitrates were started. Planned to discuss ICD but patient was lost to follow-up for quite some time.  He was once again [name] by Dr. Ladona [name] 01/2019 for evaluation of a flutter/tachycardia prior to vascular procedure. He ultimately underwent AFlutter ablation 04/2019 with post procedural sinus node dysfunction therefore his BB was held with improvement in his rates by follow up 05/21/2019.    More recently, he was scheduled to have right AV fistula by vascular surgery on 05/29/2019 however prior to incision he had a cardiac arrest in the OR. He became hypotensive and hypoxic. He received 4 minutes of CPR with ROSC.    He was then seen by Dr. Ladona [name] 08/07/19 who discussed ICD implantation due to severe cardiomyopathy however this has been  deferred until fistula placed for HD as cathter HD felt to be at higher risk of infection.   I saw him 03/04/21 He thinks he is doing pretty well. Finished up his XRT and happy about that He still has tunneled HD cath R chest, says he is not interested in AV fistula surgery given events last time. He recalls Dr. Ladona [name] discussing that we can not put ICD with the catheter in place and understands, but also thinks his heart may  be better, he is feeling better. He tolerates HD well Infrequently he has some mild DOE, this is rare. No near syncope or syncope No CP, palpitations No bleeding or signs of bleeding He plans on starting to walk for exercise He is hoping that he can get back in transplant list (2 sisters are matches apparently) Planned to update his echo, felt he may benefit from gen cards following as well and referred to Dr. Lynnette [name] Had PVCs/couplet on his EKG, noted his BB previously stopped 2/2 bradycardia  He last saw Dr. Lynnette [name] 10/25/21, tolerating Toprol, , not a candidate for SGLT2i, perhaps consider change to [facility] though low BPs historically may be limiting Planned for a 38mo visit  He saw Dr. Ladona [name] 12/03/21, walking regularly, class II sx. His son recently passed away 2/2 aneurysm. C/w tunnelled HD catheter with no plans for AV fistula, again discussed this make him not a candidate for ICD.   *** symptoms *** volume with HF *** syncope?, VT? *** eliquis, dose, labs, bleeding    VT Hx ER visit with VT Jan 2015 >> started on amiodarone, ? Stopped 2017 unknown why Feb 2021, arrest inter-op (suspect to have been PEA and primarily a respiratory event)  AFib  Atrial tachycardia, AFlutter Oct 2020  CTI ablation Dec 2020  Past Medical History:  Diagnosis Date   Atrial arrhythmia    atrial tachycardia with variable AV conduction versus atypical aflutter 01/10/19, rate control (02/06/19)   Cataract  Dyspnea    on exertion   ESRD (end stage renal disease) (HCC)    Hemo- MWF, Polycystic [name] disease   Fatigue    History of [name] stones    removal of stone- cysto   Hyperlipidemia    Hyperparathyroidism, secondary renal (HCC)    Hypertension    Hypoxemia 12/12/2013   Nonischemic cardiomyopathy (HCC)    Er 25% 2015, 55 % 2013   OSA on CPAP    no longer using cpap   OSA on CPAP 03/24/2014   Pneumonia    2015ish   Prostate cancer (HCC)    Ventricular tachycardia//Freq PVCs     Wears glasses     Past Surgical History:  Procedure Laterality Date   A-FLUTTER ABLATION N/A 04/11/2019   Procedure: A-FLUTTER ABLATION;  Surgeon: Marinus Maw, MD;  Location: [facility] INVASIVE CV LAB;  Service: Cardiovascular;  Laterality: N/A;   A/V FISTULAGRAM Left 04/27/2017   Procedure: A/V FISTULAGRAM;  Surgeon: Fransisco Hertz, MD;  Location: [facility] INVASIVE CV LAB;  Service: Cardiovascular;  Laterality: Left;  lt arm   A/V FISTULAGRAM Left 01/10/2019   Procedure: A/V FISTULAGRAM;  Surgeon: Cephus Shelling, MD;  Location: [facility] INVASIVE CV LAB;  Service: Cardiovascular;  Laterality: Left;   APPENDECTOMY     AV FISTULA PLACEMENT  12/05/2011   Procedure: ARTERIOVENOUS (AV) FISTULA CREATION;LLEFT ARM  Surgeon: Fransisco Hertz, MD;  Location: [facility] OR;  Service: Vascular;  Laterality: Left;  RADIO-CEPHALIC  fistula left arm   AV FISTULA PLACEMENT  01/11/2012   Procedure: ARTERIOVENOUS (AV) FISTULA CREATION;  Surgeon: Fransisco Hertz, MD;  Location[facility] OR;  Service: Vascular;  Laterality: Left;  Creation of left brachial cephalic arteriovenous fistula   AV FISTULA PLACEMENT Right 03/07/2019   Procedure: ARTERIOVENOUS (AV) FISTULA CREATION  RIGHT ARM;  Surgeon: Cephus Shelling, MD;  Location: [facility] OR;  Service: Vascular;  Laterality: Right;   BASCILIC VEIN TRANSPOSITION Left 12/27/2016   Procedure: BASILIC VEIN TRANSPOSITION LEFT UPPER ARM FIRST STAGE;  Surgeon: Fransisco Hertz, MD;  Location: [facility] OR;  Service: Vascular;  Laterality: Left;   BASCILIC VEIN TRANSPOSITION Left 01/31/2017   Procedure: LEFT ARM BASILIC VEIN TRANSPOSITION, SECOND STAGE;  Surgeon: Fransisco Hertz, MD;  Location: [facility] OR;  Service: Vascular;  Laterality: Left;   CARDIAC CATHETERIZATION  04-05-2010   checking for blockage but none-[facility]   COLONOSCOPY     CYSTOSCOPY W/ STONE MANIPULATION     "laser once" (01/22/2013)   HEMATOMA EVACUATION Left 05/09/2017   Procedure: EVACUATION HEMATOMA LEFT ARM;  Surgeon: Fransisco Hertz, MD;  Location: [facility] OR;   Service: Vascular;  Laterality: Left;   HERNIA REPAIR     INGUINAL HERNIA REPAIR Right 11/06/2015   Procedure: OPEN HERNIA REPAIR  RIGHT INGUINAL ADULT;  Surgeon: Luretha Murphy, MD;  Location: [facility] ORS;  Service: General;  Laterality: Right;  with MESH   INSERTION OF DIALYSIS CATHETER Right 10/05/2016   Procedure: INSERTION OF right internal jugular DIALYSIS CATHETER;  Surgeon: Larina Earthly, MD;  Location: [facility] OR;  Service: Vascular;  Laterality: Right;   INSERTION OF MESH  03/20/2012   Procedure: INSERTION OF MESH;  Mirian Mo Surgeon: Emelia Loron, MD;  Location: [facility] OR;  Service: General;  Laterality: N/A;   INSERTION OF MESH N/A 01/22/2013   Procedure: INSERTION OF MESH;  Surgeon: Emelia Loron, MD;  Location: [facility] OR;  Service: General;  Laterality: N/A;   LAPAROTOMY  04/02/2012   Procedure: EXPLORATORY LAPAROTOMY;  Surgeon:  Emelia Loron, MD;  Location: [facility] OR;  Service: General;  Laterality: N/A;  Exploratory Laparotomy with resection of small intestine   LEFT HEART CATHETERIZATION WITH CORONARY ANGIOGRAM N/A 05/14/2013   Procedure: LEFT HEART CATHETERIZATION WITH CORONARY ANGIOGRAM;  Surgeon: Lesleigh Noe, MD;  Location: [facility] CATH LAB;  Service: Cardiovascular;  Laterality: N/A;   LIGATION OF ARTERIOVENOUS  FISTULA Left 12/27/2016   Procedure: LIGATION/EXCISION OF LEFT UPPER ARM ARTERIOVENOUS  FISTULA;  EVACUATION OF HEMATOMA;  Surgeon: Fransisco Hertz, MD;  Location: [facility] OR;  Service: Vascular;  Laterality: Left;   LIGATION OF ARTERIOVENOUS  FISTULA Left 03/07/2019   Procedure: LIGATION OF ARTERIOVENOUS FISTULA  LEFT UPPER ARM;  Surgeon: Cephus Shelling, MD;  Location: [facility] OR;  Service: Vascular;  Laterality: Left;   REVISON OF ARTERIOVENOUS FISTULA Left 10/05/2016   Procedure: REVISON OF left arm ARTERIOVENOUS FISTULA;  Surgeon: Larina Earthly, MD;  Location: [facility] OR;  Service: Vascular;  Laterality: Left;   TONSILLECTOMY     UMBILICAL HERNIA REPAIR  03/20/2012   Procedure: HERNIA REPAIR  UMBILICAL ADULT;  Surgeon: Emelia Loron, MD;  Location: [facility] OR;  Service: General;  Laterality: N/A;   UMBILICAL HERNIA REPAIR  01/22/2013   preperitoneal open procedure due to significant adhesions/notes 01/22/2013   VENTRAL HERNIA REPAIR N/A 01/22/2013   Procedure: ATTEMPTED LAPAROSCOPIC VENTRAL HERNIA CONVERTED TO OPEN;  Surgeon: Emelia Loron, MD;  Location: [facility] OR;  Service: General;  Laterality: N/A;    Current Outpatient Medications  Medication Sig Dispense Refill   apixaban (ELIQUIS) 5 MG TABS tablet Take 1 tablet by mouth twice daily 180 tablet 1   cinacalcet (SENSIPAR) 30 MG tablet Take 30 mg by mouth daily.     dorzolamide-timolol (COSOPT) 2-0.5 % ophthalmic solution Place 1 drop into the left eye 2 (two) times daily. 10 mL 1   losartan (COZAAR) 25 MG tablet TAKE 1 TABLET BY MOUTH AT BEDTIME 90 tablet 2   metoprolol succinate (TOPROL-XL) 25 MG 24 hr tablet TAKE 1 TABLET BY MOUTH AT BEDTIME 90 tablet 2   nitroGLYCERIN (NITROSTAT) 0.4 MG SL tablet Place 1 tablet (0.4 mg total) under the tongue every 5 (five) minutes x 3 doses as needed for chest pain. 30 tablet 12   rosuvastatin (CRESTOR) 20 MG tablet Take 20 mg by mouth daily.     sevelamer carbonate (RENVELA) 800 MG tablet Take 1,600 mg by mouth 3 (three) times daily with meals.     spironolactone (ALDACTONE) 25 MG tablet Take 25 mg by mouth daily.     No current facility-administered medications for this visit.    Allergies:   Colchicine, Indapamide, Lipitor [atorvastatin], Viagra [sildenafil], and Zyloprim [allopurinol]   Social History:  The patient  reports that he has never smoked. He has never used smokeless tobacco. He reports that he does not drink alcohol and does not use drugs.   Family History:  The patient's family history includes Heart disease in his mother; Hyperlipidemia in his mother; Hypertension in his mother; [name] disease in his brother and father; Stroke in his father.  ROS:  Please [name] the history of  present illness.    All other systems are reviewed and otherwise negative.   PHYSICAL EXAM:  VS:  There were no vitals taken for this visit. BMI: There is no height or weight on file to calculate BMI. Well nourished, well developed, in no acute distress HEENT: normocephalic, atraumatic Neck: no JVD, carotid bruits or masses Cardiac: *** RRR; no significant  murmurs, no rubs, or gallops Lungs: *** CTA b/l, no wheezing, rhonchi or rales Abd: soft, nontender MS: no deformity or atrophy Ext: ***1+ edema to mid shin Skin: warm and dry, no rash Neuro:  No gross deficits appreciated Psych: euthymic mood, full affect    EKG:  Done 11/17/20 and reviewed by myself shows  ***     Event Monitor Summary 05/23/2019  1. NSR with sinus bradycardia 2. PAF with RVR and CVR 3. NSVT 4. No prolonged pauses  Michael [name],M.D.   Echocardiogram 06/04/2019 1. Left ventricular ejection fraction, by visual estimation, is <20%. The left ventricle has severely decreased function. There is no left ventricular hypertrophy. 2. Severely dilated left ventricular internal cavity size. 3. The left ventricle demonstrates global hypokinesis. 4. Left ventricular diastolic parameters are consistent with Grade II diastolic dysfunction (pseudonormalization). 5. Global right ventricle has normal systolicnction.The right ventricular size is normal. No increase in right ventricular wall thickness. 6. Left atrial size was severely dilated. 7. Right atrial size was severely dilated. 8. The mitral valve is normal in structure. Trivial mitral valve regurgitation. No evidence of mitral stenosis. 9. The tricuspid valve is normal in structure. Tricuspid valve regurgitation is mild. 10. The aortic valve is tricuspid. Aortic valve regurgitation is mild. Moderate aortic valve sclerosis/calcification without any evidence of aortic stenosis. 11. The pulmonic valve was normal in structure. Pulmonic valve regurgitation is not  visualized. 12. There is mild dilatation of the aortic root measuring 41 mm. 13. Normal pulmonary artery systolic pressure. 14. The inferior vena cava is dilated in size with >50% respiratory variability, suggesting right atrial pressure of 8 mmHg. 15. The tricuspid regurgitant velocity is 2.04 m/s, and with an assumed right atrial pressure of , the estimated right ventricular systolic pressure is normal at 24.6 mmHg. 16. Small pericardial effusion. 17. The pericardial effusion is posterior to the left ventricle    Atrial flutter catheter ablation 04/11/2019 CONCLUSIONS:  1. Isthmus-dependent right atrial flutter upon presentation.  2. Successful radiofrequency ablation of atrial flutter along the cavotricuspid isthmus with complete bidirectional isthmus block achieved.  3. No inducible arrhythmias following ablation.  4. No early apparent complications.   Michael Bunting, MD    Echo 07/23/13:  - Left ventricle: The cavity size was mildly dilated. Wall thickness was normal. Systolic function was severely reduced. The estimated ejection fraction was in the range of 25% to 30%. There is akinesis of the posterior myocardium. There is severe hypokinesis of the lateral myocardium. There is hypokinesis of the anterior myocardium. Doppler parameters are consistent with abnormal left ventricular relaxation (grade 1 diastolic dysfunction). - Aortic valve: Mild regurgitation. - Aortic root: The aortic root was mildly dilated. - Left atrium: The atrium was mildly dilated.   Cardiac cath 05/14/13:  1. Nonischemic cardiomyopathy with reduced EF less than 25% and severely elevated left ventricular end-diastolic pressures 2. Widely patent coronary arteries     Recent Labs: No results found for requested labs within last 365 days.  No results found for requested labs within last 365 days.   CrCl cannot be calculated (Patient's most recent lab result is older than the maximum 21 days allowed.).    Wt Readings from Last 3 Encounters:  12/03/21 188 lb (85.3 kg)  10/25/21 189 lb 9.6 oz (86 kg)  08/25/21 192 lb 4.8 oz (87.2 kg)     Other studies reviewed: Additional studies/records reviewed today include: summarized above  ASSESSMENT AND PLAN:  Persistent AFib Last SR EKG I find is ***  Feb 2021 CHA2DS2Vasc is 2, on Eliquis, *** appropriately dosed for age/weight *** No symptoms    NICM Chronic CHF (systolic) Volume with HD ***  Hx of VT *** No symptoms or syncope *** BB stopped historically 2/2 bradycardia >> tolerating current No AAD currently  HTN *** Looks good  6.  Secondary hypercoagulable state    Disposition: ***  Current medicines are reviewed at length with the patient today.  The patient did not have any concerns regarding medicines.  Norma Fredrickson, PA-C 12/11/2022 4:47 PM     [facility] [address] [place] [address] [phone] (office)  [phone] (fax)

## 2022-12-13 DIAGNOSIS — Z992 Dependence on renal dialysis: Secondary | ICD-10-CM | POA: Diagnosis not present

## 2022-12-13 DIAGNOSIS — N186 End stage renal disease: Secondary | ICD-10-CM | POA: Diagnosis not present

## 2022-12-13 DIAGNOSIS — N2581 Secondary hyperparathyroidism of renal origin: Secondary | ICD-10-CM | POA: Diagnosis not present

## 2022-12-13 DIAGNOSIS — D631 Anemia in chronic kidney disease: Secondary | ICD-10-CM | POA: Diagnosis not present

## 2022-12-14 ENCOUNTER — Ambulatory Visit: Payer: Medicare Other | Attending: Physician Assistant | Admitting: Physician Assistant

## 2022-12-15 DIAGNOSIS — N186 End stage renal disease: Secondary | ICD-10-CM | POA: Diagnosis not present

## 2022-12-15 DIAGNOSIS — N2581 Secondary hyperparathyroidism of renal origin: Secondary | ICD-10-CM | POA: Diagnosis not present

## 2022-12-15 DIAGNOSIS — D631 Anemia in chronic kidney disease: Secondary | ICD-10-CM | POA: Diagnosis not present

## 2022-12-15 DIAGNOSIS — Z992 Dependence on renal dialysis: Secondary | ICD-10-CM | POA: Diagnosis not present

## 2022-12-16 DIAGNOSIS — T8249XA Other complication of vascular dialysis catheter, initial encounter: Secondary | ICD-10-CM | POA: Diagnosis not present

## 2022-12-16 DIAGNOSIS — N186 End stage renal disease: Secondary | ICD-10-CM | POA: Diagnosis not present

## 2022-12-16 DIAGNOSIS — Z992 Dependence on renal dialysis: Secondary | ICD-10-CM | POA: Diagnosis not present

## 2022-12-17 DIAGNOSIS — N2581 Secondary hyperparathyroidism of renal origin: Secondary | ICD-10-CM | POA: Diagnosis not present

## 2022-12-17 DIAGNOSIS — N186 End stage renal disease: Secondary | ICD-10-CM | POA: Diagnosis not present

## 2022-12-17 DIAGNOSIS — D631 Anemia in chronic kidney disease: Secondary | ICD-10-CM | POA: Diagnosis not present

## 2022-12-17 DIAGNOSIS — Z992 Dependence on renal dialysis: Secondary | ICD-10-CM | POA: Diagnosis not present

## 2022-12-20 DIAGNOSIS — N2581 Secondary hyperparathyroidism of renal origin: Secondary | ICD-10-CM | POA: Diagnosis not present

## 2022-12-20 DIAGNOSIS — H348121 Central retinal vein occlusion, left eye, with retinal neovascularization: Secondary | ICD-10-CM | POA: Diagnosis not present

## 2022-12-20 DIAGNOSIS — N186 End stage renal disease: Secondary | ICD-10-CM | POA: Diagnosis not present

## 2022-12-20 DIAGNOSIS — Z992 Dependence on renal dialysis: Secondary | ICD-10-CM | POA: Diagnosis not present

## 2022-12-20 DIAGNOSIS — D631 Anemia in chronic kidney disease: Secondary | ICD-10-CM | POA: Diagnosis not present

## 2022-12-22 DIAGNOSIS — N2581 Secondary hyperparathyroidism of renal origin: Secondary | ICD-10-CM | POA: Diagnosis not present

## 2022-12-22 DIAGNOSIS — D631 Anemia in chronic kidney disease: Secondary | ICD-10-CM | POA: Diagnosis not present

## 2022-12-22 DIAGNOSIS — Z992 Dependence on renal dialysis: Secondary | ICD-10-CM | POA: Diagnosis not present

## 2022-12-22 DIAGNOSIS — N186 End stage renal disease: Secondary | ICD-10-CM | POA: Diagnosis not present

## 2022-12-24 DIAGNOSIS — D631 Anemia in chronic kidney disease: Secondary | ICD-10-CM | POA: Diagnosis not present

## 2022-12-24 DIAGNOSIS — N186 End stage renal disease: Secondary | ICD-10-CM | POA: Diagnosis not present

## 2022-12-24 DIAGNOSIS — Z992 Dependence on renal dialysis: Secondary | ICD-10-CM | POA: Diagnosis not present

## 2022-12-24 DIAGNOSIS — N2581 Secondary hyperparathyroidism of renal origin: Secondary | ICD-10-CM | POA: Diagnosis not present

## 2022-12-27 DIAGNOSIS — N186 End stage renal disease: Secondary | ICD-10-CM | POA: Diagnosis not present

## 2022-12-27 DIAGNOSIS — D631 Anemia in chronic kidney disease: Secondary | ICD-10-CM | POA: Diagnosis not present

## 2022-12-27 DIAGNOSIS — N2581 Secondary hyperparathyroidism of renal origin: Secondary | ICD-10-CM | POA: Diagnosis not present

## 2022-12-27 DIAGNOSIS — Z992 Dependence on renal dialysis: Secondary | ICD-10-CM | POA: Diagnosis not present

## 2022-12-29 DIAGNOSIS — N2581 Secondary hyperparathyroidism of renal origin: Secondary | ICD-10-CM | POA: Diagnosis not present

## 2022-12-29 DIAGNOSIS — D631 Anemia in chronic kidney disease: Secondary | ICD-10-CM | POA: Diagnosis not present

## 2022-12-29 DIAGNOSIS — Z992 Dependence on renal dialysis: Secondary | ICD-10-CM | POA: Diagnosis not present

## 2022-12-29 DIAGNOSIS — N186 End stage renal disease: Secondary | ICD-10-CM | POA: Diagnosis not present

## 2022-12-31 DIAGNOSIS — N2581 Secondary hyperparathyroidism of renal origin: Secondary | ICD-10-CM | POA: Diagnosis not present

## 2022-12-31 DIAGNOSIS — N186 End stage renal disease: Secondary | ICD-10-CM | POA: Diagnosis not present

## 2022-12-31 DIAGNOSIS — D631 Anemia in chronic kidney disease: Secondary | ICD-10-CM | POA: Diagnosis not present

## 2022-12-31 DIAGNOSIS — Z992 Dependence on renal dialysis: Secondary | ICD-10-CM | POA: Diagnosis not present

## 2023-01-01 DIAGNOSIS — Z992 Dependence on renal dialysis: Secondary | ICD-10-CM | POA: Diagnosis not present

## 2023-01-01 DIAGNOSIS — N186 End stage renal disease: Secondary | ICD-10-CM | POA: Diagnosis not present

## 2023-01-01 DIAGNOSIS — Q612 Polycystic kidney, adult type: Secondary | ICD-10-CM | POA: Diagnosis not present

## 2023-01-03 DIAGNOSIS — N186 End stage renal disease: Secondary | ICD-10-CM | POA: Diagnosis not present

## 2023-01-03 DIAGNOSIS — D631 Anemia in chronic kidney disease: Secondary | ICD-10-CM | POA: Diagnosis not present

## 2023-01-03 DIAGNOSIS — N2581 Secondary hyperparathyroidism of renal origin: Secondary | ICD-10-CM | POA: Diagnosis not present

## 2023-01-03 DIAGNOSIS — Z992 Dependence on renal dialysis: Secondary | ICD-10-CM | POA: Diagnosis not present

## 2023-01-05 DIAGNOSIS — D631 Anemia in chronic kidney disease: Secondary | ICD-10-CM | POA: Diagnosis not present

## 2023-01-05 DIAGNOSIS — N2581 Secondary hyperparathyroidism of renal origin: Secondary | ICD-10-CM | POA: Diagnosis not present

## 2023-01-05 DIAGNOSIS — N186 End stage renal disease: Secondary | ICD-10-CM | POA: Diagnosis not present

## 2023-01-05 DIAGNOSIS — Z992 Dependence on renal dialysis: Secondary | ICD-10-CM | POA: Diagnosis not present

## 2023-01-05 NOTE — Progress Notes (Signed)
Cardiology Office Note:   Date:  01/09/2023  ID:  Michael [name]., DOB [birth year], [name] [id number] PCP:  Elias Else, MD (Inactive)  [facility] Providers Cardiologist:  Alverda Skeans, MD Referring MD: No ref. provider found   Chief Complaint/Reason for Referral:  Cardiology follow up ASSESSMENT:    1. Nonischemic cardiomyopathy (HCC)   2. Primary hypertension   3. Dyslipidemia   4. Persistent atrial fibrillation (HCC)   5. Secondary hypercoagulable state (HCC)   6. End stage renal disease (HCC)   7. Unilateral inguinal hernia without obstruction or gangrene, recurrence not specified     PLAN:   In order of problems listed above: 1.  Nonischemic cardiomyopathy: Continue Toprol, losartan, and spironolactone; not an SGLT2 inhibitor candidate due to end-stage renal disease.  Obtain TTE to evaluate EF. 2.  Hypertension: Blood pressure is well-controlled today. 3.  Dyslipidemia: Will check lipid panel, LFTs, and LP(a) today. 4.  Persistent atrial fibrillation: Continue Toprol and Eliquis. 5.  Secondary hypercoagulable state: Continue Eliquis. 6.  End-stage renal disease: Continue current therapy. 7.  Inguinal/ventral hernia:  Per patient request, will refer to general surgery.      Dispo:  No follow-ups on file.    Medication Adjustments/Labs and Tests Ordered: Current medicines are reviewed at length with the patient today.  Concerns regarding medicines are outlined above.  The following changes have been made:  no change   Labs/tests ordered: Orders Placed This Encounter  Procedures   EKG 12-Lead    Medication Changes: No orders of the defined types were placed in this encounter.   Current medicines are reviewed at length with the patient today.  The patient does not have concerns regarding medicines.  History of Present Illness:   FOCUSED PROBLEM LIST:   Nonischemic cardiomyopathy with ejection fraction of 20%; not a candidate for an ICD due to infection risk  from indwelling hemodialysis catheter End-stage renal disease on hemodialysis (TuThSat) Atrial flutter status post ablation; persistent atrial fibrillation on anticoagulation; CV2 score 3 Sustained ventricular tachycardia 2015; coronary angiography demonstrated no coronary artery disease Hypertension   December 2022: The patient was started on losartan 25 mg at night, spironolactone 25 mg at night, Toprol 25 mg at night, and no SGLT2 inhibitor was pursued due to end-stage renal disease.  Aspirin was stopped as the patient was on Eliquis.   June 2023: He was admitted in April due to metabolic encephalopathy thought to be due to uremia.  He is doing fairly well.  He notes that he is somewhat short of breath on the days that he is supposed to get dialysis for example on Tuesday morning.  After dialysis he feels very well.  He denies any exertional angina, exertional dyspnea, presyncope, or syncope.  He has had no severe bleeding or bruising while on anticoagulation.  He is otherwise well and without significant complaints today.  Plan: Continue medical therapy.  September 2024: In the interim the patient was seen by EP in August of last year and was doing well.  He is encouraged by recent news about an awake kidney transplantation procedure that was done over the summer.  He is interested in pursuing this.  He has been tolerating dialysis well without any issues.  He denies any dyspnea, exertional angina, presyncope, or syncope.  Has had no palpitations.  He has some chronic lower extremity edema.  He has required no emergency room visits or hospitalizations.  He denies any severe bleeding or bruising episodes while on anticoagulation.  He is a longstanding left inguinal hernia and ventral hernia.  He underwent repair of a right inguinal hernia some years ago.  He is interested in perhaps getting this repaired if possible.        Current Medications: Current Meds  Medication Sig   apixaban (ELIQUIS) 5  MG TABS tablet Take 1 tablet by mouth twice daily   cinacalcet (SENSIPAR) 30 MG tablet Take 30 mg by mouth daily.   dorzolamide-timolol (COSOPT) 2-0.5 % ophthalmic solution Place 1 drop into the left eye 2 (two) times daily.   losartan (COZAAR) 25 MG tablet TAKE 1 TABLET BY MOUTH AT BEDTIME   metoprolol succinate (TOPROL-XL) 25 MG 24 hr tablet TAKE 1 TABLET BY MOUTH AT BEDTIME   nitroGLYCERIN (NITROSTAT) 0.4 MG SL tablet Place 1 tablet (0.4 mg total) under the tongue every 5 (five) minutes x 3 doses as needed for chest pain.   rosuvastatin (CRESTOR) 20 MG tablet Take 20 mg by mouth daily.   sevelamer carbonate (RENVELA) 800 MG tablet Take 1,600 mg by mouth 3 (three) times daily with meals.   spironolactone (ALDACTONE) 25 MG tablet Take 25 mg by mouth daily.     Allergies:    Colchicine, Indapamide, Lipitor [atorvastatin], Viagra [sildenafil], and Zyloprim [allopurinol]   Social History:   Social History   Tobacco Use   Smoking status: Never   Smokeless tobacco: Never  Vaping Use   Vaping status: Never Used  Substance Use Topics   Alcohol use: No    Alcohol/week: 0.0 standard drinks of alcohol   Drug use: No     Family Hx: Family History  Problem Relation Age of Onset   Heart disease Mother    Hyperlipidemia Mother    Hypertension Mother    Kidney disease Father    Stroke Father    Kidney disease Brother    Amblyopia Neg Hx    Blindness Neg Hx    Cataracts Neg Hx    Diabetes Neg Hx    Glaucoma Neg Hx    Macular degeneration Neg Hx    Retinal detachment Neg Hx    Strabismus Neg Hx    Retinitis pigmentosa Neg Hx    Breast cancer Neg Hx    Prostate cancer Neg Hx    Colon cancer Neg Hx    Pancreatic cancer Neg Hx      Review of Systems:   Please see the history of present illness.    All other systems reviewed and are negative.     EKGs/Labs/Other Test Reviewed:   EKG: EKG done April 2023 that I reviewed demonstrates atrial fibrillation with a right bundle branch  block  EKG Interpretation Date/Time:  Monday January 09 2023 09:51:06 EDT Ventricular Rate:  61 PR Interval:    QRS Duration:  162 QT Interval:  518 QTC Calculation: 521 R Axis:   270  Text Interpretation: Atrial fibrillation with premature ventricular or aberrantly conducted complexes Right bundle branch block Anteroseptal infarct , age undetermined When compared with ECG of 11-Aug-2021 10:22, PREVIOUS ECG IS PRESENT Confirmed by Alverda Skeans ([id number]) on 01/09/2023 10:11:18 AM        Prior CV studies reviewed: Cardiac Studies & Procedures     STRESS TESTS  NM MYOCAR MULTI W/SPECT W 05/18/2013  Narrative CLINICAL DATA:  Myocardial viability study  EXAM: MYOCARDIAL IMAGING WITH SPECT (REST) - THALLIUM  TECHNIQUE: Standard myocardial SPECT imaging was performed after resting intravenous injection of 3.5 mCi 201 Thallium. Delayed imaging was performed at  24 hr after additional administration of 1.0 mCi 201 Thallium.  COMPARISON:  None.  FINDINGS: On immediate imaging, there may be mild thinning involving the lateral apex. Distribution is otherwise physiologic.  On 24 hr delayed imaging, there is possible mild redistribution of radiotracer in the lateral apex.  Overall, this redistribution is not considered significant.  IMPRESSION: Essentially normal Thallium viability study. Very mild apical thinning with apical redistribution as possible.   Electronically Signed By: Charline Bills M.D. On: 05/18/2013 17:58   ECHOCARDIOGRAM  ECHOCARDIOGRAM COMPLETE 08/13/2021  Narrative ECHOCARDIOGRAM REPORT    Patient Name:   Ashland Vongphakdy. Date of Exam: 08/13/2021 Medical Rec #:  [id number]          Height:       67.0 in Accession #:    [id number]         Weight:       188.1 lb Date of Birth:  12/24/1955          BSA:          1.970 m Patient Age:    66 years           BP:           109/71 mmHg Patient Gender: M                  HR:           87 bpm. Exam  Location:  Inpatient  Procedure: 2D Echo  Indications:    Pericardial effusion  History:        Patient has prior history of Echocardiogram examinations, most recent 04/22/2021. Signs/Symptoms:Shortness of Breath; Risk Factors:Dyslipidemia and Hypertension.  Sonographer:    Devonne Doughty Referring Phys: Gomez Cleverly LAMA  IMPRESSIONS   1. No significant changes from previous echo in Dec. 2022. 2. Left ventricular ejection fraction, by estimation, is <20%. The left ventricle has severely decreased function. The left ventricle demonstrates global hypokinesis. The left ventricular internal cavity size was severely dilated. Left ventricular diastolic parameters are consistent with Grade III diastolic dysfunction (restrictive). 3. Right ventricular systolic function is severely reduced. The right ventricular size is severely enlarged. There is moderately elevated pulmonary artery systolic pressure. The estimated right ventricular systolic pressure is 54.9 mmHg. 4. Left atrial size was severely dilated. 5. Right atrial size was severely dilated. 6. The mitral valve is normal in structure. Trivial mitral valve regurgitation. No evidence of mitral stenosis. 7. Tricuspid valve regurgitation is mild to moderate. 8. The aortic valve is tricuspid. Aortic valve regurgitation is mild. No aortic stenosis is present.  FINDINGS Left Ventricle: Left ventricular ejection fraction, by estimation, is <20%. The left ventricle has severely decreased function. The left ventricle demonstrates global hypokinesis. The left ventricular internal cavity size was severely dilated. There is no left ventricular hypertrophy. Left ventricular diastolic parameters are consistent with Grade III diastolic dysfunction (restrictive).  Right Ventricle: The right ventricular size is severely enlarged. Right vetricular wall thickness was not well visualized. Right ventricular systolic function is severely reduced. There is  moderately elevated pulmonary artery systolic pressure. The tricuspid regurgitant velocity is 3.16 m/s, and with an assumed right atrial pressure of 15 mmHg, the estimated right ventricular systolic pressure is 54.9 mmHg.  Left Atrium: Left atrial size was severely dilated.  Right Atrium: Right atrial size was severely dilated.  Pericardium: There is no evidence of pericardial effusion.  Mitral Valve: The mitral valve is normal in structure. Trivial mitral valve regurgitation. No evidence of  mitral valve stenosis.  Tricuspid Valve: The tricuspid valve is normal in structure. Tricuspid valve regurgitation is mild to moderate.  Aortic Valve: The aortic valve is tricuspid. Aortic valve regurgitation is mild. Aortic regurgitation PHT measures 487 msec. No aortic stenosis is present. Aortic valve mean gradient measures 3.0 mmHg. Aortic valve peak gradient measures 5.1 mmHg. Aortic valve area, by VTI measures 2.09 cm.  Pulmonic Valve: The pulmonic valve was normal in structure. Pulmonic valve regurgitation is not visualized.  Aorta: The aortic root and ascending aorta are structurally normal, with no evidence of dilitation.  IAS/Shunts: The atrial septum is grossly normal.   LEFT VENTRICLE PLAX 2D LVIDd:         7.20 cm   Diastology LVIDs:         6.80 cm   LV e' lateral:   8.49 cm/s LV PW:         0.90 cm   LV E/e' lateral: 14.0 LV IVS:        0.90 cm LVOT diam:     2.10 cm LV SV:         45 LV SV Index:   23 LVOT Area:     3.46 cm   RIGHT VENTRICLE            IVC RV Basal diam:  5.40 cm    IVC diam: 2.30 cm RV Mid diam:    5.00 cm RV S prime:     7.51 cm/s TAPSE (M-mode): 0.8 cm  LEFT ATRIUM              Index        RIGHT ATRIUM           Index LA diam:        4.50 cm  2.28 cm/m   RA Area:     31.60 cm LA Vol (A2C):   133.0 ml 67.51 ml/m  RA Volume:   109.00 ml 55.33 ml/m LA Vol (A4C):   102.0 ml 51.78 ml/m LA Biplane Vol: 119.0 ml 60.41 ml/m AORTIC VALVE                     PULMONIC VALVE AV Area (Vmax):    2.39 cm     PV Vmax:          0.76 m/s AV Area (Vmean):   2.32 cm     PV Peak grad:     2.3 mmHg AV Area (VTI):     2.09 cm     PR End Diast Vel: 6.76 msec AV Vmax:           113.00 cm/s AV Vmean:          72.400 cm/s AV VTI:            0.214 m AV Peak Grad:      5.1 mmHg AV Mean Grad:      3.0 mmHg LVOT Vmax:         77.95 cm/s LVOT Vmean:        48.500 cm/s LVOT VTI:          0.129 m LVOT/AV VTI ratio: 0.60 AI PHT:            487 msec  AORTA Ao Root diam: 4.20 cm Ao Asc diam:  3.40 cm  MITRAL VALVE                TRICUSPID VALVE MV Area (PHT): 4.71 cm  TR Peak grad:   39.9 mmHg MV Decel Time: 161 msec     TR Vmax:        316.00 cm/s MV E velocity: 119.00 cm/s SHUNTS Systemic VTI:  0.13 m Systemic Diam: 2.10 cm  Kristeen Miss MD Electronically signed by Kristeen Miss MD Signature Date/Time: 08/13/2021/12:57:04 PM    Final    MONITORS  LONG TERM MONITOR (3-14 DAYS) 06/07/2019  Narrative 1. nsr with sinus bradycardia 2. PAF with RVR and CVR 3. NSVT 4. No prolonged pauses  Gregg Taylor,M.D.           Recent Labs: No results found for requested labs within last 365 days.   Lipid Panel    Component Value Date/Time   TRIG 105 06/01/2019 0418    Risk Assessment/Calculations:    CHA2DS2-VASc Score = 3   This indicates a 3.2% annual risk of stroke. The patient's score is based upon: CHF History: 1 HTN History: 1 Diabetes History: 0 Stroke History: 0 Vascular Disease History: 0 Age Score: 1 Gender Score: 0         Physical Exam:   VS:  BP 114/80 (BP Location: Right Arm, Patient Position: Sitting, Cuff Size: Large)   Pulse 61   Ht 5\' 7"  (1.702 m)   Wt 193 lb 12.8 oz (87.9 kg)   SpO2 98%   BMI 30.35 kg/m        Wt Readings from Last 3 Encounters:  01/09/23 193 lb 12.8 oz (87.9 kg)  12/03/21 188 lb (85.3 kg)  10/25/21 189 lb 9.6 oz (86 kg)      GENERAL:  No apparent distress, AOx3 HEENT:  No  carotid bruits, +2 carotid impulses, no scleral icterus CAR: Irregular RR no murmurs, gallops, rubs, or thrills RES:  Clear to auscultation bilaterally ABD:  Soft, nontender, nondistended, positive bowel sounds x 4; L inguinal hernia VASC:  +2 radial pulses, +2 carotid pulses NEURO:  CN 2-12 grossly intact; motor and sensory grossly intact PSYCH:  No active depression or anxiety EXT:  +1 edema, without ecchymosis, or cyanosis  Signed, Orbie Pyo, MD  01/09/2023 10:22 AM    [facility] [address] [place], [place], [place]  [address] Phone: [phone]; Fax: [phone]   Note:  This document was prepared using Dragon voice recognition software and may include unintentional dictation errors.

## 2023-01-07 DIAGNOSIS — N2581 Secondary hyperparathyroidism of renal origin: Secondary | ICD-10-CM | POA: Diagnosis not present

## 2023-01-07 DIAGNOSIS — Z992 Dependence on renal dialysis: Secondary | ICD-10-CM | POA: Diagnosis not present

## 2023-01-07 DIAGNOSIS — N186 End stage renal disease: Secondary | ICD-10-CM | POA: Diagnosis not present

## 2023-01-07 DIAGNOSIS — D631 Anemia in chronic kidney disease: Secondary | ICD-10-CM | POA: Diagnosis not present

## 2023-01-09 ENCOUNTER — Encounter: Payer: Self-pay | Admitting: Internal Medicine

## 2023-01-09 ENCOUNTER — Ambulatory Visit: Payer: BC Managed Care – PPO | Attending: Internal Medicine | Admitting: Internal Medicine

## 2023-01-09 VITALS — BP 114/80 | HR 61 | Ht 67.0 in | Wt 193.8 lb

## 2023-01-09 DIAGNOSIS — N186 End stage renal disease: Secondary | ICD-10-CM | POA: Diagnosis not present

## 2023-01-09 DIAGNOSIS — D6869 Other thrombophilia: Secondary | ICD-10-CM | POA: Diagnosis not present

## 2023-01-09 DIAGNOSIS — E785 Hyperlipidemia, unspecified: Secondary | ICD-10-CM | POA: Diagnosis not present

## 2023-01-09 DIAGNOSIS — K4090 Unilateral inguinal hernia, without obstruction or gangrene, not specified as recurrent: Secondary | ICD-10-CM | POA: Insufficient documentation

## 2023-01-09 DIAGNOSIS — I428 Other cardiomyopathies: Secondary | ICD-10-CM | POA: Diagnosis not present

## 2023-01-09 DIAGNOSIS — I10 Essential (primary) hypertension: Secondary | ICD-10-CM | POA: Diagnosis not present

## 2023-01-09 DIAGNOSIS — I4819 Other persistent atrial fibrillation: Secondary | ICD-10-CM | POA: Diagnosis not present

## 2023-01-09 NOTE — Addendum Note (Signed)
Addended by: Franchot Gallo on: 01/09/2023 10:30 AM   Modules accepted: Orders

## 2023-01-09 NOTE — Patient Instructions (Signed)
Medication Instructions:  Your physician recommends that you continue on your current medications as directed. Please refer to the Current Medication list given to you today.  *If you need a refill on your cardiac medications before your next appointment, please call your pharmacy*  Lab Work: TODAY: Lipids, Lipoprotein A, LFTs If you have labs (blood work) drawn today and your tests are completely normal, you will receive your results only by: MyChart Message (if you have MyChart) OR A paper copy in the mail If you have any lab test that is abnormal or we need to change your treatment, we will call you to review the results.  Testing/Procedures: Your physician has requested that you have an echocardiogram. Echocardiography is a painless test that uses sound waves to create images of your heart. It provides your doctor with information about the size and shape of your heart and how well your heart's chambers and valves are working. This procedure takes approximately one hour. There are no restrictions for this procedure. Please do NOT wear cologne, perfume, aftershave, or lotions (deodorant is allowed). Please arrive 15 minutes prior to your appointment time.  Follow-Up: At [facility], you and your health needs are our priority.  As part of our continuing mission to provide you with exceptional heart care, we have created designated Provider Care Teams.  These Care Teams include your primary Cardiologist (physician) and Advanced Practice Providers (APPs -  Physician Assistants and Nurse Practitioners) who all work together to provide you with the care you need, when you need it.  Your next appointment:   6 month(s)  The format for your next appointment:   In Person  Provider:   Jari Favre, PA-C, Robin Searing, NP, or Tereso Newcomer, PA-C   Other Instructions You have been referred to a general surgeon in [place]. A scheduler will call you to schedule an appointment to meet with a  surgeon to discuss evaluation and treatment of your inguinal hernia. Please allow 2 weeks, if you have not heard anything by this time please call our office at [phone].

## 2023-01-10 DIAGNOSIS — D631 Anemia in chronic kidney disease: Secondary | ICD-10-CM | POA: Diagnosis not present

## 2023-01-10 DIAGNOSIS — N186 End stage renal disease: Secondary | ICD-10-CM | POA: Diagnosis not present

## 2023-01-10 DIAGNOSIS — Z992 Dependence on renal dialysis: Secondary | ICD-10-CM | POA: Diagnosis not present

## 2023-01-10 DIAGNOSIS — N2581 Secondary hyperparathyroidism of renal origin: Secondary | ICD-10-CM | POA: Diagnosis not present

## 2023-01-10 LAB — LIPID PANEL
Chol/HDL Ratio: 2.1 ratio (ref 0.0–5.0)
Cholesterol, Total: 151 mg/dL (ref 100–199)
HDL: 73 mg/dL (ref >39)
LDL Chol Calc (NIH): 68 mg/dL (ref 0–99)
Triglycerides: 46 mg/dL (ref 0–149)
VLDL Cholesterol Cal: 10 mg/dL (ref 5–40)

## 2023-01-10 LAB — HEPATIC FUNCTION PANEL
ALT: 6 IU/L (ref 0–44)
AST: 15 IU/L (ref 0–40)
Albumin: 3.2 g/dL — ABNORMAL LOW (ref 3.9–4.9)
Alkaline Phosphatase: 35 IU/L — ABNORMAL LOW (ref 44–121)
Bilirubin Total: 1 mg/dL (ref 0.0–1.2)
Bilirubin, Direct: 0.39 mg/dL (ref 0.00–0.40)
Total Protein: 5.2 g/dL — ABNORMAL LOW (ref 6.0–8.5)

## 2023-01-10 LAB — LIPOPROTEIN A (LPA): Lipoprotein (a): 253.5 nmol/L — ABNORMAL HIGH (ref <75.0)

## 2023-01-12 DIAGNOSIS — Z992 Dependence on renal dialysis: Secondary | ICD-10-CM | POA: Diagnosis not present

## 2023-01-12 DIAGNOSIS — N2581 Secondary hyperparathyroidism of renal origin: Secondary | ICD-10-CM | POA: Diagnosis not present

## 2023-01-12 DIAGNOSIS — D631 Anemia in chronic kidney disease: Secondary | ICD-10-CM | POA: Diagnosis not present

## 2023-01-12 DIAGNOSIS — N186 End stage renal disease: Secondary | ICD-10-CM | POA: Diagnosis not present

## 2023-01-16 ENCOUNTER — Telehealth: Payer: Self-pay | Admitting: *Deleted

## 2023-01-16 DIAGNOSIS — E785 Hyperlipidemia, unspecified: Secondary | ICD-10-CM

## 2023-01-16 MED ORDER — ROSUVASTATIN CALCIUM 40 MG PO TABS: 40.0000 mg | ORAL_TABLET | Freq: Every day | ORAL | 3 refills | Status: DC

## 2023-01-16 NOTE — Telephone Encounter (Signed)
Spoke w patient and reviewed results and recommendations.  Pt in agreement w plan.  Prescription sent for Crestor 40 mg and lab appointment made.

## 2023-01-16 NOTE — Telephone Encounter (Signed)
-----   Message from Orbie Pyo sent at 01/11/2023  8:55 AM EDT ----- Given your lipoprotein a levels it would be best if LDL was less than 55, lets increase Crestor to 40 mg recheck lipid panel and LFTs in 2 months.

## 2023-01-17 DIAGNOSIS — D631 Anemia in chronic kidney disease: Secondary | ICD-10-CM | POA: Diagnosis not present

## 2023-01-17 DIAGNOSIS — N2581 Secondary hyperparathyroidism of renal origin: Secondary | ICD-10-CM | POA: Diagnosis not present

## 2023-01-17 DIAGNOSIS — N186 End stage renal disease: Secondary | ICD-10-CM | POA: Diagnosis not present

## 2023-01-17 DIAGNOSIS — Z992 Dependence on renal dialysis: Secondary | ICD-10-CM | POA: Diagnosis not present

## 2023-01-17 MED ORDER — ROSUVASTATIN CALCIUM 10 MG PO TABS: 10.0000 mg | ORAL_TABLET | Freq: Every day | ORAL | 3 refills | Status: DC

## 2023-01-17 NOTE — Telephone Encounter (Signed)
Per PharmD - Crestor max dose should be 10 mg w ESRD/on dialysis.  Called the [facility] and cancelled the 40 mg dose.  Sent in 10 mg Crestor and called the patient and let him know we placed referral for PharmD to help manage lipids and that his new dose is now only 10 mg.

## 2023-01-18 ENCOUNTER — Telehealth: Payer: Self-pay | Admitting: Internal Medicine

## 2023-01-18 NOTE — Telephone Encounter (Signed)
Pt stated he'd like a callback regarding some changes in his medications. Please advise

## 2023-01-18 NOTE — Telephone Encounter (Signed)
Returned call to patient.  He was at dialysis yesterday when we spoke so he just wanted to confirm he is to decrease Crestor to 10 mg daily.  While on phone we scheduled his PharmD visit.  Pt grateful for assistance.

## 2023-01-19 DIAGNOSIS — N186 End stage renal disease: Secondary | ICD-10-CM | POA: Diagnosis not present

## 2023-01-19 DIAGNOSIS — Z992 Dependence on renal dialysis: Secondary | ICD-10-CM | POA: Diagnosis not present

## 2023-01-19 DIAGNOSIS — N2581 Secondary hyperparathyroidism of renal origin: Secondary | ICD-10-CM | POA: Diagnosis not present

## 2023-01-19 DIAGNOSIS — D631 Anemia in chronic kidney disease: Secondary | ICD-10-CM | POA: Diagnosis not present

## 2023-01-21 DIAGNOSIS — N2581 Secondary hyperparathyroidism of renal origin: Secondary | ICD-10-CM | POA: Diagnosis not present

## 2023-01-21 DIAGNOSIS — D631 Anemia in chronic kidney disease: Secondary | ICD-10-CM | POA: Diagnosis not present

## 2023-01-21 DIAGNOSIS — Z992 Dependence on renal dialysis: Secondary | ICD-10-CM | POA: Diagnosis not present

## 2023-01-21 DIAGNOSIS — N186 End stage renal disease: Secondary | ICD-10-CM | POA: Diagnosis not present

## 2023-01-24 DIAGNOSIS — N2581 Secondary hyperparathyroidism of renal origin: Secondary | ICD-10-CM | POA: Diagnosis not present

## 2023-01-24 DIAGNOSIS — Z992 Dependence on renal dialysis: Secondary | ICD-10-CM | POA: Diagnosis not present

## 2023-01-24 DIAGNOSIS — D631 Anemia in chronic kidney disease: Secondary | ICD-10-CM | POA: Diagnosis not present

## 2023-01-24 DIAGNOSIS — N186 End stage renal disease: Secondary | ICD-10-CM | POA: Diagnosis not present

## 2023-01-26 DIAGNOSIS — Z992 Dependence on renal dialysis: Secondary | ICD-10-CM | POA: Diagnosis not present

## 2023-01-26 DIAGNOSIS — N2581 Secondary hyperparathyroidism of renal origin: Secondary | ICD-10-CM | POA: Diagnosis not present

## 2023-01-26 DIAGNOSIS — D631 Anemia in chronic kidney disease: Secondary | ICD-10-CM | POA: Diagnosis not present

## 2023-01-26 DIAGNOSIS — N186 End stage renal disease: Secondary | ICD-10-CM | POA: Diagnosis not present

## 2023-01-27 ENCOUNTER — Ambulatory Visit (HOSPITAL_COMMUNITY): Payer: Medicare Other

## 2023-01-27 ENCOUNTER — Ambulatory Visit: Payer: Medicare Other

## 2023-01-27 NOTE — Progress Notes (Deleted)
Patient ID: [name].                 DOB: [birth year]                    [name]: [id number]     HPI: Michael [name]. is a 67 y.o. male patient referred to lipid clinic by Dr Lynnette Caffey. PMH is significant for nonischemic cardiomyopathy with EF < 20% 07/2021, HTN, HLD, PAF, ESRD on dialysis TueThuSat.  Current Medications: rosuvastatin 10mg  daily Intolerances:  Risk Factors:  LDL goal:   Diet:   Exercise:   Family History: Mother with HTN, HLD, and heart disease. Father with kidney disease and stroke. Brother with kidney disease.  Social History: No tobacco, alcohol or drug use.  Labs:  Past Medical History:  Diagnosis Date   Atrial arrhythmia    atrial tachycardia with variable AV conduction versus atypical aflutter 01/10/19, rate control (02/06/19)   Cataract    Dyspnea    on exertion   ESRD (end stage renal disease) (HCC)    Hemo- MWF, Polycystic kidney disease   Fatigue    History of kidney stones    removal of stone- cysto   Hyperlipidemia    Hyperparathyroidism, secondary renal (HCC)    Hypertension    Hypoxemia 12/12/2013   Nonischemic cardiomyopathy (HCC)    Er 25% 2015, 55 % 2013   OSA on CPAP    no longer using cpap   OSA on CPAP 03/24/2014   Pneumonia    2015ish   Prostate cancer (HCC)    Ventricular tachycardia//Freq PVCs    Wears glasses     Current Outpatient Medications on File Prior to Visit  Medication Sig Dispense Refill   apixaban (ELIQUIS) 5 MG TABS tablet Take 1 tablet by mouth twice daily 180 tablet 1   cinacalcet (SENSIPAR) 30 MG tablet Take 30 mg by mouth daily.     dorzolamide-timolol (COSOPT) 2-0.5 % ophthalmic solution Place 1 drop into the left eye 2 (two) times daily. 10 mL 1   losartan (COZAAR) 25 MG tablet TAKE 1 TABLET BY MOUTH AT BEDTIME 90 tablet 2   metoprolol succinate (TOPROL-XL) 25 MG 24 hr tablet TAKE 1 TABLET BY MOUTH AT BEDTIME 90 tablet 2   nitroGLYCERIN (NITROSTAT) 0.4 MG SL tablet Place 1 tablet (0.4 mg total) under  the tongue every 5 (five) minutes x 3 doses as needed for chest pain. 30 tablet 12   rosuvastatin (CRESTOR) 10 MG tablet Take 1 tablet (10 mg total) by mouth daily. 90 tablet 3   sevelamer carbonate (RENVELA) 800 MG tablet Take 1,600 mg by mouth 3 (three) times daily with meals.     spironolactone (ALDACTONE) 25 MG tablet Take 25 mg by mouth daily.     No current facility-administered medications on file prior to visit.    Allergies  Allergen Reactions   Colchicine Other (See Comments)    Other reaction(s): foot pain   Indapamide Other (See Comments)    unknown   Lipitor [Atorvastatin] Itching    Leg pain   Viagra [Sildenafil] Other (See Comments)    Feels weird - Levitra. - headache   Zyloprim [Allopurinol] Other (See Comments)    foot pain    Assessment/Plan:  1. Hyperlipidemia -  Reviewed options for lowering LDL cholesterol, including statins, ezetimibe, PCSK9 inhibitors, Nexletol/Nexlizet, and Leqvio. Discussed efficacy, dosing, side effects, and copay information.

## 2023-01-28 DIAGNOSIS — N186 End stage renal disease: Secondary | ICD-10-CM | POA: Diagnosis not present

## 2023-01-28 DIAGNOSIS — N2581 Secondary hyperparathyroidism of renal origin: Secondary | ICD-10-CM | POA: Diagnosis not present

## 2023-01-28 DIAGNOSIS — Z992 Dependence on renal dialysis: Secondary | ICD-10-CM | POA: Diagnosis not present

## 2023-01-28 DIAGNOSIS — D631 Anemia in chronic kidney disease: Secondary | ICD-10-CM | POA: Diagnosis not present

## 2023-01-31 DIAGNOSIS — Z992 Dependence on renal dialysis: Secondary | ICD-10-CM | POA: Diagnosis not present

## 2023-01-31 DIAGNOSIS — Q612 Polycystic kidney, adult type: Secondary | ICD-10-CM | POA: Diagnosis not present

## 2023-01-31 DIAGNOSIS — D631 Anemia in chronic kidney disease: Secondary | ICD-10-CM | POA: Diagnosis not present

## 2023-01-31 DIAGNOSIS — N186 End stage renal disease: Secondary | ICD-10-CM | POA: Diagnosis not present

## 2023-01-31 DIAGNOSIS — N2581 Secondary hyperparathyroidism of renal origin: Secondary | ICD-10-CM | POA: Diagnosis not present

## 2023-02-02 DIAGNOSIS — N186 End stage renal disease: Secondary | ICD-10-CM | POA: Diagnosis not present

## 2023-02-02 DIAGNOSIS — N2581 Secondary hyperparathyroidism of renal origin: Secondary | ICD-10-CM | POA: Diagnosis not present

## 2023-02-02 DIAGNOSIS — Z992 Dependence on renal dialysis: Secondary | ICD-10-CM | POA: Diagnosis not present

## 2023-02-02 DIAGNOSIS — D631 Anemia in chronic kidney disease: Secondary | ICD-10-CM | POA: Diagnosis not present

## 2023-02-04 DIAGNOSIS — N2581 Secondary hyperparathyroidism of renal origin: Secondary | ICD-10-CM | POA: Diagnosis not present

## 2023-02-04 DIAGNOSIS — D631 Anemia in chronic kidney disease: Secondary | ICD-10-CM | POA: Diagnosis not present

## 2023-02-04 DIAGNOSIS — Z992 Dependence on renal dialysis: Secondary | ICD-10-CM | POA: Diagnosis not present

## 2023-02-04 DIAGNOSIS — N186 End stage renal disease: Secondary | ICD-10-CM | POA: Diagnosis not present

## 2023-02-07 ENCOUNTER — Ambulatory Visit: Payer: Medicare Other | Admitting: Surgery

## 2023-02-07 DIAGNOSIS — H4089 Other specified glaucoma: Secondary | ICD-10-CM | POA: Diagnosis not present

## 2023-02-07 DIAGNOSIS — N2581 Secondary hyperparathyroidism of renal origin: Secondary | ICD-10-CM | POA: Diagnosis not present

## 2023-02-07 DIAGNOSIS — H4052X4 Glaucoma secondary to other eye disorders, left eye, indeterminate stage: Secondary | ICD-10-CM | POA: Diagnosis not present

## 2023-02-07 DIAGNOSIS — N186 End stage renal disease: Secondary | ICD-10-CM | POA: Diagnosis not present

## 2023-02-07 DIAGNOSIS — Z992 Dependence on renal dialysis: Secondary | ICD-10-CM | POA: Diagnosis not present

## 2023-02-07 DIAGNOSIS — D631 Anemia in chronic kidney disease: Secondary | ICD-10-CM | POA: Diagnosis not present

## 2023-02-09 ENCOUNTER — Ambulatory Visit: Payer: Medicare Other | Admitting: Surgery

## 2023-02-09 DIAGNOSIS — N2581 Secondary hyperparathyroidism of renal origin: Secondary | ICD-10-CM | POA: Diagnosis not present

## 2023-02-09 DIAGNOSIS — N186 End stage renal disease: Secondary | ICD-10-CM | POA: Diagnosis not present

## 2023-02-09 DIAGNOSIS — D631 Anemia in chronic kidney disease: Secondary | ICD-10-CM | POA: Diagnosis not present

## 2023-02-09 DIAGNOSIS — Z992 Dependence on renal dialysis: Secondary | ICD-10-CM | POA: Diagnosis not present

## 2023-02-14 DIAGNOSIS — D631 Anemia in chronic kidney disease: Secondary | ICD-10-CM | POA: Diagnosis not present

## 2023-02-14 DIAGNOSIS — N186 End stage renal disease: Secondary | ICD-10-CM | POA: Diagnosis not present

## 2023-02-14 DIAGNOSIS — N2581 Secondary hyperparathyroidism of renal origin: Secondary | ICD-10-CM | POA: Diagnosis not present

## 2023-02-14 DIAGNOSIS — Z992 Dependence on renal dialysis: Secondary | ICD-10-CM | POA: Diagnosis not present

## 2023-02-15 ENCOUNTER — Ambulatory Visit: Payer: Medicare Other | Attending: Cardiovascular Disease | Admitting: Student

## 2023-02-15 ENCOUNTER — Telehealth: Payer: Self-pay | Admitting: Pharmacy Technician

## 2023-02-15 ENCOUNTER — Encounter: Payer: Self-pay | Admitting: Student

## 2023-02-15 ENCOUNTER — Other Ambulatory Visit (HOSPITAL_COMMUNITY): Payer: Self-pay

## 2023-02-15 ENCOUNTER — Telehealth: Payer: Self-pay | Admitting: Pharmacist

## 2023-02-15 DIAGNOSIS — E785 Hyperlipidemia, unspecified: Secondary | ICD-10-CM

## 2023-02-15 NOTE — Assessment & Plan Note (Signed)
Assessment:  LDL goal: < 55 mg/dl last LDLc 68 mg/dl, Lp(a) 811.9; goal <14 mg/dl  Tolerates moderate/high intensity statins well without any side effects unable to titrate dose due to ESRD  Other risk factors includes CAD, HLD, elevated Lp(a),hx of TIA, family hx of ASCVD in both mother and father  Discussed next potential options (Zetia, PCSK-9 inhibitors, and inclisiran); cost, dosing efficacy, side effects  Patient follows low salt diet and eats home cooked healthy meals, walks 20 min every other day (busy schedule with doctor's visits)   Plan: Continue taking current medications Crestor 10 mg daily  Reasonable to consider PCSK9i over Zetia given the risk factors and elevated Lp(a) Will apply for PA for PCSK9i; will inform patient upon approval Lipid lab due in 2-3 months after starting [facility]

## 2023-02-15 NOTE — Telephone Encounter (Signed)
Patient made aware of PA approval pt says he does not have any deductible so will call insurance to confirm and will get back to me on Friday Oct 18,2024

## 2023-02-15 NOTE — Patient Instructions (Signed)
Your Results:             Your most recent labs Goal  Total Cholesterol 151 < 200  Triglycerides 46 < 150  HDL (happy/good cholesterol) 73 > 40  LDL (lousy/bad cholesterol 68 < 55   Medication changes: We will start the process to get PCSK9i (Repatha or Praluent)  covered by your insurance.  Once the prior authorization is complete, we will call you to let you know and confirm pharmacy information.     Praluent is a cholesterol medication that improved your body's ability to get rid of "bad cholesterol" known as LDL. It can lower your LDL up to 60%. It is an injection that is given under the skin every 2 weeks. The most common side effects of Praluent include runny nose, symptoms of the common cold, rarely flu or flu-like symptoms, back/muscle pain in about 3-4% of the patients, and redness, pain, or bruising at the injection site.    Repatha is a cholesterol medication that improved your body's ability to get rid of "bad cholesterol" known as LDL. It can lower your LDL up to 60%! It is an injection that is given under the skin every 2 weeks. The most common side effects of Repatha include runny nose, symptoms of the common cold, rarely flu or flu-like symptoms, back/muscle pain in about 3-4% of the patients, and redness, pain, or bruising at the injection site.   Lab orders: We want to repeat labs after 2-3 months.  We will send you a lab order to remind you once we get closer to that time.

## 2023-02-15 NOTE — Telephone Encounter (Signed)
PA for PCSK9i requested

## 2023-02-15 NOTE — Progress Notes (Signed)
Patient ID: Michael [name].                 DOB: 06-18-1955                    [name]: [id number]     HPI: Michael [name]. is a 67 y.o. male patient referred to lipid clinic by Dr Lynnette Caffey. PMH is significant for nonischemic cardiomyopathy with EF < 20% 07/2021, HTN, HLD, PAF, ESRD on dialysis TueThuSat. Elevated Lp(a)  Patient presented today for lipid clinic. Patient is on dialysis for 13 years now and started taking Crestor 10 mg almost a year ago. Tolerates it well. He watches salt in his diet for sodium intake due to his kidney problem. He does not get much time to do exercise due to multiple appointments but he walks for 20 min every other day.  Current Medications: rosuvastatin 10mg  daily Intolerances: none  Risk Factors: CAD, HLD,ESRD, elevated Lp(a),hx of TIA family hx of ASCVD in both mother and father  LDL goal: <55 mg/dl Last lab Lp(a) 782.9, LDLc 68, TG 46, TC 151, HDL 73  Diet: watch what he eats, low sodium diet Once a month eats out   Exercise: walking 20 min every other day   Family History: Mother with HTN, HLD, and heart disease. Father with kidney disease and stroke. Brother with kidney disease.  Social History: No tobacco, alcohol or drug use.  Labs:  Past Medical History:  Diagnosis Date   Atrial arrhythmia    atrial tachycardia with variable AV conduction versus atypical aflutter 01/10/19, rate control (02/06/19)   Cataract    Dyspnea    on exertion   ESRD (end stage renal disease) (HCC)    Hemo- MWF, Polycystic kidney disease   Fatigue    History of kidney stones    removal of stone- cysto   Hyperlipidemia    Hyperparathyroidism, secondary renal (HCC)    Hypertension    Hypoxemia 12/12/2013   Nonischemic cardiomyopathy (HCC)    Er 25% 2015, 55 % 2013   OSA on CPAP    no longer using cpap   OSA on CPAP 03/24/2014   Pneumonia    2015ish   Prostate cancer (HCC)    Ventricular tachycardia//Freq PVCs    Wears glasses     Current Outpatient  Medications on File Prior to Visit  Medication Sig Dispense Refill   apixaban (ELIQUIS) 5 MG TABS tablet Take 1 tablet by mouth twice daily 180 tablet 1   cinacalcet (SENSIPAR) 30 MG tablet Take 30 mg by mouth daily.     dorzolamide-timolol (COSOPT) 2-0.5 % ophthalmic solution Place 1 drop into the left eye 2 (two) times daily. 10 mL 1   losartan (COZAAR) 25 MG tablet TAKE 1 TABLET BY MOUTH AT BEDTIME 90 tablet 2   metoprolol succinate (TOPROL-XL) 25 MG 24 hr tablet TAKE 1 TABLET BY MOUTH AT BEDTIME 90 tablet 2   nitroGLYCERIN (NITROSTAT) 0.4 MG SL tablet Place 1 tablet (0.4 mg total) under the tongue every 5 (five) minutes x 3 doses as needed for chest pain. 30 tablet 12   rosuvastatin (CRESTOR) 10 MG tablet Take 1 tablet (10 mg total) by mouth daily. 90 tablet 3   sevelamer carbonate (RENVELA) 800 MG tablet Take 1,600 mg by mouth 3 (three) times daily with meals.     spironolactone (ALDACTONE) 25 MG tablet Take 25 mg by mouth daily.     No current facility-administered medications on file prior to  visit.    Allergies  Allergen Reactions   Colchicine Other (See Comments)    Other reaction(s): foot pain   Indapamide Other (See Comments)    unknown   Lipitor [Atorvastatin] Itching    Leg pain   Viagra [Sildenafil] Other (See Comments)    Feels weird - Levitra. - headache   Zyloprim [Allopurinol] Other (See Comments)    foot pain    Assessment/Plan:  1. Hyperlipidemia -   Reviewed options for lowering LDL cholesterol, including statins, ezetimibe, PCSK9 inhibitors, Nexletol/Nexlizet, and Leqvio. Discussed efficacy, dosing, side effects, and copay information.

## 2023-02-15 NOTE — Telephone Encounter (Signed)
Pharmacy Patient Advocate Encounter  Received notification from [facility] that Prior Authorization for repatha has been APPROVED from 02/15/23 to 02/15/24. Ran test claim, Copay is $367.75- one month- deductible  . This test claim was processed through [facility]- copay amounts may vary at other pharmacies due to pharmacy/plan contracts, or as the patient moves through the different stages of their insurance plan.   PA #/Case ID/Reference #: [id number]

## 2023-02-16 DIAGNOSIS — N186 End stage renal disease: Secondary | ICD-10-CM | POA: Diagnosis not present

## 2023-02-16 DIAGNOSIS — Z992 Dependence on renal dialysis: Secondary | ICD-10-CM | POA: Diagnosis not present

## 2023-02-16 DIAGNOSIS — D631 Anemia in chronic kidney disease: Secondary | ICD-10-CM | POA: Diagnosis not present

## 2023-02-16 DIAGNOSIS — N2581 Secondary hyperparathyroidism of renal origin: Secondary | ICD-10-CM | POA: Diagnosis not present

## 2023-02-17 ENCOUNTER — Ambulatory Visit (HOSPITAL_COMMUNITY): Payer: BC Managed Care – PPO | Attending: Internal Medicine

## 2023-02-17 ENCOUNTER — Telehealth: Payer: Self-pay | Admitting: *Deleted

## 2023-02-17 DIAGNOSIS — I428 Other cardiomyopathies: Secondary | ICD-10-CM

## 2023-02-17 LAB — ECHOCARDIOGRAM COMPLETE
Est EF: <20
P 1/2 time: 830 ms
S' Lateral: 7 cm

## 2023-02-17 MED ORDER — REPATHA SURECLICK 140 MG/ML ~~LOC~~ SOAJ: 140.0000 mg | SUBCUTANEOUS | 2 refills | Status: DC

## 2023-02-17 NOTE — Telephone Encounter (Signed)
Called patient to inform Dr. Lynnette Caffey reviewed echo and recommends referral to HF clinic, referral placed.  He said he has a lot going on right now between dialysis and a lot of other appointments, and "my heart's been like this a long time now".   Adv they will reach out to schedule but it will probably be a few weeks before he is able to get in.  Pt voices understanding and agreement.

## 2023-02-17 NOTE — Telephone Encounter (Signed)
Patient requested to send prescription to [facility] pharmacy.

## 2023-02-17 NOTE — Telephone Encounter (Addendum)
-----   Message from Orbie Pyo sent at 02/15/2023  9:55 AM EDT ----- Thanks.  Alyxander Kollmann, can you refer him to AHF to help monitor him? ----- Message ----- From: Tylene Fantasia, [facility] Sent: 02/15/2023   9:34 AM EDT To: Christell Constant, MD; # _______________________________________________________  Referral placed to [facility] to help manage/monitor. Dx: nonischemic cardiomyopathy with EF < 20% 07/2021, HTN, HLD, PAF, ESRD on dialysis TueThuSat.

## 2023-02-17 NOTE — Addendum Note (Signed)
Addended by: Tylene Fantasia on: 02/17/2023 09:04 AM   Modules accepted: Orders

## 2023-02-18 DIAGNOSIS — Z992 Dependence on renal dialysis: Secondary | ICD-10-CM | POA: Diagnosis not present

## 2023-02-18 DIAGNOSIS — N186 End stage renal disease: Secondary | ICD-10-CM | POA: Diagnosis not present

## 2023-02-18 DIAGNOSIS — N2581 Secondary hyperparathyroidism of renal origin: Secondary | ICD-10-CM | POA: Diagnosis not present

## 2023-02-18 DIAGNOSIS — D631 Anemia in chronic kidney disease: Secondary | ICD-10-CM | POA: Diagnosis not present

## 2023-02-21 DIAGNOSIS — N186 End stage renal disease: Secondary | ICD-10-CM | POA: Diagnosis not present

## 2023-02-21 DIAGNOSIS — D631 Anemia in chronic kidney disease: Secondary | ICD-10-CM | POA: Diagnosis not present

## 2023-02-21 DIAGNOSIS — Z992 Dependence on renal dialysis: Secondary | ICD-10-CM | POA: Diagnosis not present

## 2023-02-21 DIAGNOSIS — N2581 Secondary hyperparathyroidism of renal origin: Secondary | ICD-10-CM | POA: Diagnosis not present

## 2023-02-22 ENCOUNTER — Other Ambulatory Visit: Payer: Self-pay | Admitting: Internal Medicine

## 2023-02-23 DIAGNOSIS — N2581 Secondary hyperparathyroidism of renal origin: Secondary | ICD-10-CM | POA: Diagnosis not present

## 2023-02-23 DIAGNOSIS — D631 Anemia in chronic kidney disease: Secondary | ICD-10-CM | POA: Diagnosis not present

## 2023-02-23 DIAGNOSIS — N186 End stage renal disease: Secondary | ICD-10-CM | POA: Diagnosis not present

## 2023-02-23 DIAGNOSIS — Z992 Dependence on renal dialysis: Secondary | ICD-10-CM | POA: Diagnosis not present

## 2023-02-25 DIAGNOSIS — Z992 Dependence on renal dialysis: Secondary | ICD-10-CM | POA: Diagnosis not present

## 2023-02-25 DIAGNOSIS — D631 Anemia in chronic kidney disease: Secondary | ICD-10-CM | POA: Diagnosis not present

## 2023-02-25 DIAGNOSIS — N2581 Secondary hyperparathyroidism of renal origin: Secondary | ICD-10-CM | POA: Diagnosis not present

## 2023-02-25 DIAGNOSIS — N186 End stage renal disease: Secondary | ICD-10-CM | POA: Diagnosis not present

## 2023-02-28 DIAGNOSIS — N2581 Secondary hyperparathyroidism of renal origin: Secondary | ICD-10-CM | POA: Diagnosis not present

## 2023-02-28 DIAGNOSIS — D631 Anemia in chronic kidney disease: Secondary | ICD-10-CM | POA: Diagnosis not present

## 2023-02-28 DIAGNOSIS — Z992 Dependence on renal dialysis: Secondary | ICD-10-CM | POA: Diagnosis not present

## 2023-02-28 DIAGNOSIS — N186 End stage renal disease: Secondary | ICD-10-CM | POA: Diagnosis not present

## 2023-03-02 ENCOUNTER — Ambulatory Visit (INDEPENDENT_AMBULATORY_CARE_PROVIDER_SITE_OTHER): Payer: Medicare Other | Admitting: Surgery

## 2023-03-02 ENCOUNTER — Encounter: Payer: Self-pay | Admitting: Surgery

## 2023-03-02 VITALS — BP 101/66 | HR 59 | Temp 97.6°F | Resp 12 | Ht 67.0 in | Wt 189.0 lb

## 2023-03-02 DIAGNOSIS — Z992 Dependence on renal dialysis: Secondary | ICD-10-CM | POA: Diagnosis not present

## 2023-03-02 DIAGNOSIS — K4090 Unilateral inguinal hernia, without obstruction or gangrene, not specified as recurrent: Secondary | ICD-10-CM | POA: Diagnosis not present

## 2023-03-02 DIAGNOSIS — I5022 Chronic systolic (congestive) heart failure: Secondary | ICD-10-CM | POA: Diagnosis not present

## 2023-03-02 DIAGNOSIS — N186 End stage renal disease: Secondary | ICD-10-CM | POA: Diagnosis not present

## 2023-03-02 DIAGNOSIS — N2581 Secondary hyperparathyroidism of renal origin: Secondary | ICD-10-CM | POA: Diagnosis not present

## 2023-03-02 DIAGNOSIS — I428 Other cardiomyopathies: Secondary | ICD-10-CM | POA: Diagnosis not present

## 2023-03-02 DIAGNOSIS — D631 Anemia in chronic kidney disease: Secondary | ICD-10-CM | POA: Diagnosis not present

## 2023-03-03 DIAGNOSIS — N186 End stage renal disease: Secondary | ICD-10-CM | POA: Diagnosis not present

## 2023-03-03 DIAGNOSIS — Z992 Dependence on renal dialysis: Secondary | ICD-10-CM | POA: Diagnosis not present

## 2023-03-03 DIAGNOSIS — Q612 Polycystic kidney, adult type: Secondary | ICD-10-CM | POA: Diagnosis not present

## 2023-03-03 NOTE — Progress Notes (Signed)
[facility] History and Physical  Reason for Referral: Left inguinal hernia Referring Physician: Dr. Lynnette Caffey  Chief Complaint   New Patient (Initial Visit)     Michael [name]. is a 67 y.o. male.  HPI: Patient presents for evaluation of his left inguinal hernia.  Michael [name] believes the hernia has been present for about 1 year.  His biggest complaint is that the area has resulted in a significant bulge.  Michael [name] denies any pain associated with the hernia.  Michael [name] has never tried to reduce the hernia.  Michael [name] denies nausea and vomiting, and is moving his bowels without issue.  His past medical history is significant for end-stage renal disease on hemodialysis Tuesday, Thursday, Saturday, and Sunday, ischemic cardiomyopathy with most recent EF of less than 20%, hypertension, hyperlipidemia, and atrial tachycardia versus a flutter.  Michael [name] is currently taking Eliquis daily.  Michael [name] receives dialysis through a right chest tunneled dialysis catheter.  His surgical history is significant for an umbilical hernia repair, right inguinal hernia repair, and multiple surgeries related to vascular access for hemodialysis.  Michael [name] states that the last time Michael [name] underwent surgery for a left upper extremity AV fistula, Michael [name] had a heart attack on the table associated with his anesthesia requiring CPR and was intubated for 3 days afterwards.  Michael [name] denies use of tobacco products, alcohol, and illicit drugs.  Past Medical History:  Diagnosis Date   Atrial arrhythmia    atrial tachycardia with variable AV conduction versus atypical aflutter 01/10/19, rate control (02/06/19)   Cataract    Dyspnea    on exertion   ESRD (end stage renal disease) (HCC)    Hemo- MWF, Polycystic kidney disease   Fatigue    History of kidney stones    removal of stone- cysto   Hyperlipidemia    Hyperparathyroidism, secondary renal (HCC)    Hypertension    Hypoxemia 12/12/2013   Nonischemic cardiomyopathy (HCC)    Er 25% 2015, 55 % 2013   OSA on CPAP     no longer using cpap   OSA on CPAP 03/24/2014   Pneumonia    2015ish   Prostate cancer (HCC)    Ventricular tachycardia//Freq PVCs    Wears glasses     Past Surgical History:  Procedure Laterality Date   A-FLUTTER ABLATION N/A 04/11/2019   Procedure: A-FLUTTER ABLATION;  Surgeon: Marinus Maw, MD;  Location: [facility] INVASIVE CV LAB;  Service: Cardiovascular;  Laterality: N/A;   A/V FISTULAGRAM Left 04/27/2017   Procedure: A/V FISTULAGRAM;  Surgeon: Fransisco Hertz, MD;  Location: [facility] INVASIVE CV LAB;  Service: Cardiovascular;  Laterality: Left;  lt arm   A/V FISTULAGRAM Left 01/10/2019   Procedure: A/V FISTULAGRAM;  Surgeon: Cephus Shelling, MD;  Location: [facility] INVASIVE CV LAB;  Service: Cardiovascular;  Laterality: Left;   APPENDECTOMY     AV FISTULA PLACEMENT  12/05/2011   Procedure: ARTERIOVENOUS (AV) FISTULA CREATION;LLEFT ARM  Surgeon: Fransisco Hertz, MD;  Location: [facility] OR;  Service: Vascular;  Laterality: Left;  RADIO-CEPHALIC  fistula left arm   AV FISTULA PLACEMENT  01/11/2012   Procedure: ARTERIOVENOUS (AV) FISTULA CREATION;  Surgeon: Fransisco Hertz, MD;  Location: [facility] OR;  Service: Vascular;  Laterality: Left;  Creation of left brachial cephalic arteriovenous fistula   AV FISTULA PLACEMENT Right 03/07/2019   Procedure: ARTERIOVENOUS (AV) FISTULA CREATION  RIGHT ARM;  Surgeon: Cephus Shelling, MD;  Location: [facility] OR;  Service: Vascular;  Laterality: Right;   BASCILIC VEIN TRANSPOSITION Left  12/27/2016   Procedure: BASILIC VEIN TRANSPOSITION LEFT UPPER ARM FIRST STAGE;  Surgeon: Fransisco Hertz, MD;  Location: [facility] OR;  Service: Vascular;  Laterality: Left;   BASCILIC VEIN TRANSPOSITION Left 01/31/2017   Procedure: LEFT ARM BASILIC VEIN TRANSPOSITION, SECOND STAGE;  Surgeon: Fransisco Hertz, MD;  Location: [facility] OR;  Service: Vascular;  Laterality: Left;   CARDIAC CATHETERIZATION  04-05-2010   checking for blockage but none-[facility]   COLONOSCOPY     CYSTOSCOPY W/ STONE MANIPULATION     "laser  once" (01/22/2013)   HEMATOMA EVACUATION Left 05/09/2017   Procedure: EVACUATION HEMATOMA LEFT ARM;  Surgeon: Fransisco Hertz, MD;  Location: [facility] OR;  Service: Vascular;  Laterality: Left;   HERNIA REPAIR     INGUINAL HERNIA REPAIR Right 11/06/2015   Procedure: OPEN HERNIA REPAIR  RIGHT INGUINAL ADULT;  Surgeon: Luretha Murphy, MD;  Location: [facility] ORS;  Service: General;  Laterality: Right;  with MESH   INSERTION OF DIALYSIS CATHETER Right 10/05/2016   Procedure: INSERTION OF right internal jugular DIALYSIS CATHETER;  Surgeon: Larina Earthly, MD;  Location: [facility] OR;  Service: Vascular;  Laterality: Right;   INSERTION OF MESH  03/20/2012   Procedure: INSERTION OF MESH;  Mirian Mo Surgeon: Emelia Loron, MD;  Location: [facility] OR;  Service: General;  Laterality: N/A;   INSERTION OF MESH N/A 01/22/2013   Procedure: INSERTION OF MESH;  Surgeon: Emelia Loron, MD;  Location: [facility] OR;  Service: General;  Laterality: N/A;   LAPAROTOMY  04/02/2012   Procedure: EXPLORATORY LAPAROTOMY;  Surgeon: Emelia Loron, MD;  Location: [facility] OR;  Service: General;  Laterality: N/A;  Exploratory Laparotomy with resection of small intestine   LEFT HEART CATHETERIZATION WITH CORONARY ANGIOGRAM N/A 05/14/2013   Procedure: LEFT HEART CATHETERIZATION WITH CORONARY ANGIOGRAM;  Surgeon: Lesleigh Noe, MD;  Location: [facility] CATH LAB;  Service: Cardiovascular;  Laterality: N/A;   LIGATION OF ARTERIOVENOUS  FISTULA Left 12/27/2016   Procedure: LIGATION/EXCISION OF LEFT UPPER ARM ARTERIOVENOUS  FISTULA;  EVACUATION OF HEMATOMA;  Surgeon: Fransisco Hertz, MD;  Location: [facility] OR;  Service: Vascular;  Laterality: Left;   LIGATION OF ARTERIOVENOUS  FISTULA Left 03/07/2019   Procedure: LIGATION OF ARTERIOVENOUS FISTULA  LEFT UPPER ARM;  Surgeon: Cephus Shelling, MD;  Location: [facility] OR;  Service: Vascular;  Laterality: Left;   REVISON OF ARTERIOVENOUS FISTULA Left 10/05/2016   Procedure: REVISON OF left arm ARTERIOVENOUS FISTULA;  Surgeon: Larina Earthly, MD;   Location: [facility] OR;  Service: Vascular;  Laterality: Left;   TONSILLECTOMY     UMBILICAL HERNIA REPAIR  03/20/2012   Procedure: HERNIA REPAIR UMBILICAL ADULT;  Surgeon: Emelia Loron, MD;  Location: [facility] OR;  Service: General;  Laterality: N/A;   UMBILICAL HERNIA REPAIR  01/22/2013   preperitoneal open procedure due to significant adhesions/notes 01/22/2013   VENTRAL HERNIA REPAIR N/A 01/22/2013   Procedure: ATTEMPTED LAPAROSCOPIC VENTRAL HERNIA CONVERTED TO OPEN;  Surgeon: Emelia Loron, MD;  Location: [facility] OR;  Service: General;  Laterality: N/A;    Family History  Problem Relation Age of Onset   Heart disease Mother    Hyperlipidemia Mother    Hypertension Mother    Kidney disease Father    Stroke Father    Kidney disease Brother    Amblyopia Neg Hx    Blindness Neg Hx    Cataracts Neg Hx    Diabetes Neg Hx    Glaucoma Neg Hx    Macular degeneration Neg Hx  Retinal detachment Neg Hx    Strabismus Neg Hx    Retinitis pigmentosa Neg Hx    Breast cancer Neg Hx    Prostate cancer Neg Hx    Colon cancer Neg Hx    Pancreatic cancer Neg Hx     Social History   Tobacco Use   Smoking status: Never   Smokeless tobacco: Never  Vaping Use   Vaping status: Never Used  Substance Use Topics   Alcohol use: No    Alcohol/week: 0.0 standard drinks of alcohol   Drug use: No    Medications: I have reviewed the patient's current medications. Allergies as of 03/02/2023       Reactions   Colchicine Other (See Comments)   Other reaction(s): foot pain   Indapamide Other (See Comments)   unknown   Lipitor [atorvastatin] Itching   Leg pain   Viagra [sildenafil] Other (See Comments)   Feels weird - Levitra. - headache   Zyloprim [allopurinol] Other (See Comments)   foot pain        Medication List        Accurate as of March 02, 2023 11:59 PM. If you have any questions, ask your nurse or doctor.          cinacalcet 30 MG tablet Commonly known as: SENSIPAR Take 30  mg by mouth daily.   dorzolamide-timolol 2-0.5 % ophthalmic solution Commonly known as: COSOPT Place 1 drop into the left eye 2 (two) times daily.   Eliquis 5 MG Tabs tablet Generic drug: apixaban Take 1 tablet by mouth twice daily   losartan 25 MG tablet Commonly known as: COZAAR TAKE 1 TABLET BY MOUTH AT BEDTIME   metoprolol succinate 25 MG 24 hr tablet Commonly known as: TOPROL-XL TAKE 1 TABLET BY MOUTH AT BEDTIME   nitroGLYCERIN 0.4 MG SL tablet Commonly known as: NITROSTAT Place 1 tablet (0.4 mg total) under the tongue every 5 (five) minutes x 3 doses as needed for chest pain.   Repatha SureClick 140 MG/ML Soaj Generic drug: Evolocumab Inject 140 mg into the skin every 14 (fourteen) days.   rosuvastatin 10 MG tablet Commonly known as: CRESTOR Take 1 tablet (10 mg total) by mouth daily.   sevelamer carbonate 800 MG tablet Commonly known as: RENVELA Take 1,600 mg by mouth 3 (three) times daily with meals.   spironolactone 25 MG tablet Commonly known as: ALDACTONE Take 25 mg by mouth daily.         ROS:  Constitutional: negative for chills, fatigue, and fevers Eyes: negative for visual disturbance and pain Ears, nose, mouth, throat, and face: negative for ear drainage, sore throat, and sinus problems Respiratory: positive for shortness of breath, negative for cough and wheezing Cardiovascular: negative for chest pain and palpitations Gastrointestinal: negative for abdominal pain, nausea, reflux symptoms, and vomiting Genitourinary:negative for dysuria and frequency Integument/breast: positive for dryness, negative for rash Hematologic/lymphatic: negative for bleeding and lymphadenopathy Musculoskeletal:negative for back pain and neck pain Neurological: negative for dizziness and tremors Endocrine: negative for temperature intolerance  Blood pressure 101/66, pulse (!) 59, temperature 97.6 F (36.4 C), temperature source Other (Comment), resp. rate 12, height  5\' 7"  (1.702 m), weight 189 lb (85.7 kg), SpO2 90%. Physical Exam Vitals reviewed.  Constitutional:      Appearance: Normal appearance.  HENT:     Head: Normocephalic and atraumatic.  Eyes:     Extraocular Movements: Extraocular movements intact.     Pupils: Pupils are equal, round, and reactive to  light.  Cardiovascular:     Rate and Rhythm: Normal rate and regular rhythm.  Pulmonary:     Effort: Pulmonary effort is normal.     Breath sounds: Normal breath sounds.  Abdominal:     Comments: Abdomen soft, nondistended, no percussion tenderness, nontender to palpation; no rigidity, guarding, rebound tenderness; very large left inguinal hernia, soft and reducible but immediately herniates after releasing pressure  Musculoskeletal:        General: Normal range of motion.     Cervical back: Normal range of motion.  Skin:    General: Skin is warm and dry.  Neurological:     General: No focal deficit present.     Mental Status: Michael [name] is alert and oriented to person, place, and time.  Psychiatric:        Mood and Affect: Mood normal.        Behavior: Behavior normal.     Results: No results found for this or any previous visit (from the past 48 hour(s)).  No results found.   Assessment & Plan:  Michael [name]. is a 67 y.o. male who presents for evaluation of a left inguinal hernia.  -We discussed the pathophysiology of hernias, and why we recommend surgical repair -I further discussed with the patient that Michael [name] recently underwent an echocardiogram on 02/17/2023, at which time his EF was noted to be <20% with left ventricle demonstrating severely decreased function and global hypokinesis.  Michael [name] was referred to the advanced heart failure clinic.  Given his significantly decreased EF, the patient is not a candidate for elective surgery, as Michael [name] would be high risk for cardiac complications.  Michael [name] would also not be a candidate for surgery at [facility].  Per the patient, Michael [name] also had a heart  attack requiring CPR during his last surgery -Since the patient's hernia is not causing him significant pain, I would continue to monitor the area.  If Michael [name] begins to have significant pain at his hernia site with associated nausea, vomiting, and obstipation, Michael [name] needs to present to the emergency department for evaluation  All questions were answered to the satisfaction of the patient.  Theophilus Kinds, DO [facility] [address] [place], [place] [address] [phone] (office)

## 2023-03-04 DIAGNOSIS — N186 End stage renal disease: Secondary | ICD-10-CM | POA: Diagnosis not present

## 2023-03-04 DIAGNOSIS — D631 Anemia in chronic kidney disease: Secondary | ICD-10-CM | POA: Diagnosis not present

## 2023-03-04 DIAGNOSIS — Z992 Dependence on renal dialysis: Secondary | ICD-10-CM | POA: Diagnosis not present

## 2023-03-04 DIAGNOSIS — N2581 Secondary hyperparathyroidism of renal origin: Secondary | ICD-10-CM | POA: Diagnosis not present

## 2023-03-07 DIAGNOSIS — D631 Anemia in chronic kidney disease: Secondary | ICD-10-CM | POA: Diagnosis not present

## 2023-03-07 DIAGNOSIS — N186 End stage renal disease: Secondary | ICD-10-CM | POA: Diagnosis not present

## 2023-03-07 DIAGNOSIS — N2581 Secondary hyperparathyroidism of renal origin: Secondary | ICD-10-CM | POA: Diagnosis not present

## 2023-03-07 DIAGNOSIS — Z992 Dependence on renal dialysis: Secondary | ICD-10-CM | POA: Diagnosis not present

## 2023-03-09 DIAGNOSIS — N2581 Secondary hyperparathyroidism of renal origin: Secondary | ICD-10-CM | POA: Diagnosis not present

## 2023-03-09 DIAGNOSIS — N186 End stage renal disease: Secondary | ICD-10-CM | POA: Diagnosis not present

## 2023-03-09 DIAGNOSIS — D631 Anemia in chronic kidney disease: Secondary | ICD-10-CM | POA: Diagnosis not present

## 2023-03-09 DIAGNOSIS — Z992 Dependence on renal dialysis: Secondary | ICD-10-CM | POA: Diagnosis not present

## 2023-03-13 ENCOUNTER — Ambulatory Visit: Payer: Medicare Other

## 2023-03-13 DIAGNOSIS — E785 Hyperlipidemia, unspecified: Secondary | ICD-10-CM

## 2023-03-14 ENCOUNTER — Telehealth (HOSPITAL_COMMUNITY): Payer: Self-pay | Admitting: Cardiology

## 2023-03-14 DIAGNOSIS — Z992 Dependence on renal dialysis: Secondary | ICD-10-CM | POA: Diagnosis not present

## 2023-03-14 DIAGNOSIS — D631 Anemia in chronic kidney disease: Secondary | ICD-10-CM | POA: Diagnosis not present

## 2023-03-14 DIAGNOSIS — N2581 Secondary hyperparathyroidism of renal origin: Secondary | ICD-10-CM | POA: Diagnosis not present

## 2023-03-14 DIAGNOSIS — H348121 Central retinal vein occlusion, left eye, with retinal neovascularization: Secondary | ICD-10-CM | POA: Diagnosis not present

## 2023-03-14 DIAGNOSIS — N186 End stage renal disease: Secondary | ICD-10-CM | POA: Diagnosis not present

## 2023-03-14 DIAGNOSIS — H4052X4 Glaucoma secondary to other eye disorders, left eye, indeterminate stage: Secondary | ICD-10-CM | POA: Diagnosis not present

## 2023-03-14 NOTE — Telephone Encounter (Signed)
Pt returned call to the AHF clinic Declined to make appt Reports he has enough MD's and does not need another

## 2023-03-16 DIAGNOSIS — D631 Anemia in chronic kidney disease: Secondary | ICD-10-CM | POA: Diagnosis not present

## 2023-03-16 DIAGNOSIS — N2581 Secondary hyperparathyroidism of renal origin: Secondary | ICD-10-CM | POA: Diagnosis not present

## 2023-03-16 DIAGNOSIS — N186 End stage renal disease: Secondary | ICD-10-CM | POA: Diagnosis not present

## 2023-03-16 DIAGNOSIS — Z992 Dependence on renal dialysis: Secondary | ICD-10-CM | POA: Diagnosis not present

## 2023-03-18 DIAGNOSIS — N2581 Secondary hyperparathyroidism of renal origin: Secondary | ICD-10-CM | POA: Diagnosis not present

## 2023-03-18 DIAGNOSIS — Z992 Dependence on renal dialysis: Secondary | ICD-10-CM | POA: Diagnosis not present

## 2023-03-18 DIAGNOSIS — D631 Anemia in chronic kidney disease: Secondary | ICD-10-CM | POA: Diagnosis not present

## 2023-03-18 DIAGNOSIS — N186 End stage renal disease: Secondary | ICD-10-CM | POA: Diagnosis not present

## 2023-03-21 DIAGNOSIS — D631 Anemia in chronic kidney disease: Secondary | ICD-10-CM | POA: Diagnosis not present

## 2023-03-21 DIAGNOSIS — Z992 Dependence on renal dialysis: Secondary | ICD-10-CM | POA: Diagnosis not present

## 2023-03-21 DIAGNOSIS — N2581 Secondary hyperparathyroidism of renal origin: Secondary | ICD-10-CM | POA: Diagnosis not present

## 2023-03-21 DIAGNOSIS — N186 End stage renal disease: Secondary | ICD-10-CM | POA: Diagnosis not present

## 2023-03-23 DIAGNOSIS — Z992 Dependence on renal dialysis: Secondary | ICD-10-CM | POA: Diagnosis not present

## 2023-03-23 DIAGNOSIS — N186 End stage renal disease: Secondary | ICD-10-CM | POA: Diagnosis not present

## 2023-03-23 DIAGNOSIS — N2581 Secondary hyperparathyroidism of renal origin: Secondary | ICD-10-CM | POA: Diagnosis not present

## 2023-03-23 DIAGNOSIS — D631 Anemia in chronic kidney disease: Secondary | ICD-10-CM | POA: Diagnosis not present

## 2023-03-25 DIAGNOSIS — N186 End stage renal disease: Secondary | ICD-10-CM | POA: Diagnosis not present

## 2023-03-25 DIAGNOSIS — N2581 Secondary hyperparathyroidism of renal origin: Secondary | ICD-10-CM | POA: Diagnosis not present

## 2023-03-25 DIAGNOSIS — D631 Anemia in chronic kidney disease: Secondary | ICD-10-CM | POA: Diagnosis not present

## 2023-03-25 DIAGNOSIS — Z992 Dependence on renal dialysis: Secondary | ICD-10-CM | POA: Diagnosis not present

## 2023-03-27 DIAGNOSIS — D631 Anemia in chronic kidney disease: Secondary | ICD-10-CM | POA: Diagnosis not present

## 2023-03-27 DIAGNOSIS — Z992 Dependence on renal dialysis: Secondary | ICD-10-CM | POA: Diagnosis not present

## 2023-03-27 DIAGNOSIS — N186 End stage renal disease: Secondary | ICD-10-CM | POA: Diagnosis not present

## 2023-03-27 DIAGNOSIS — N2581 Secondary hyperparathyroidism of renal origin: Secondary | ICD-10-CM | POA: Diagnosis not present

## 2023-03-29 DIAGNOSIS — D631 Anemia in chronic kidney disease: Secondary | ICD-10-CM | POA: Diagnosis not present

## 2023-03-29 DIAGNOSIS — N2581 Secondary hyperparathyroidism of renal origin: Secondary | ICD-10-CM | POA: Diagnosis not present

## 2023-03-29 DIAGNOSIS — N186 End stage renal disease: Secondary | ICD-10-CM | POA: Diagnosis not present

## 2023-03-29 DIAGNOSIS — Z992 Dependence on renal dialysis: Secondary | ICD-10-CM | POA: Diagnosis not present

## 2023-04-01 DIAGNOSIS — Z992 Dependence on renal dialysis: Secondary | ICD-10-CM | POA: Diagnosis not present

## 2023-04-01 DIAGNOSIS — N2581 Secondary hyperparathyroidism of renal origin: Secondary | ICD-10-CM | POA: Diagnosis not present

## 2023-04-01 DIAGNOSIS — D631 Anemia in chronic kidney disease: Secondary | ICD-10-CM | POA: Diagnosis not present

## 2023-04-01 DIAGNOSIS — N186 End stage renal disease: Secondary | ICD-10-CM | POA: Diagnosis not present

## 2023-04-02 DEATH — deceased

## 2023-04-04 DIAGNOSIS — Z992 Dependence on renal dialysis: Secondary | ICD-10-CM | POA: Diagnosis not present

## 2023-04-04 DIAGNOSIS — N2581 Secondary hyperparathyroidism of renal origin: Secondary | ICD-10-CM | POA: Diagnosis not present

## 2023-04-04 DIAGNOSIS — D631 Anemia in chronic kidney disease: Secondary | ICD-10-CM | POA: Diagnosis not present

## 2023-04-04 DIAGNOSIS — N186 End stage renal disease: Secondary | ICD-10-CM | POA: Diagnosis not present

## 2023-04-06 DIAGNOSIS — D631 Anemia in chronic kidney disease: Secondary | ICD-10-CM | POA: Diagnosis not present

## 2023-04-06 DIAGNOSIS — Z992 Dependence on renal dialysis: Secondary | ICD-10-CM | POA: Diagnosis not present

## 2023-04-06 DIAGNOSIS — N2581 Secondary hyperparathyroidism of renal origin: Secondary | ICD-10-CM | POA: Diagnosis not present

## 2023-04-06 DIAGNOSIS — N186 End stage renal disease: Secondary | ICD-10-CM | POA: Diagnosis not present

## 2023-04-08 DIAGNOSIS — N186 End stage renal disease: Secondary | ICD-10-CM | POA: Diagnosis not present

## 2023-04-08 DIAGNOSIS — N2581 Secondary hyperparathyroidism of renal origin: Secondary | ICD-10-CM | POA: Diagnosis not present

## 2023-04-08 DIAGNOSIS — Z992 Dependence on renal dialysis: Secondary | ICD-10-CM | POA: Diagnosis not present

## 2023-04-08 DIAGNOSIS — D631 Anemia in chronic kidney disease: Secondary | ICD-10-CM | POA: Diagnosis not present

## 2023-04-11 DIAGNOSIS — D631 Anemia in chronic kidney disease: Secondary | ICD-10-CM | POA: Diagnosis not present

## 2023-04-11 DIAGNOSIS — Z992 Dependence on renal dialysis: Secondary | ICD-10-CM | POA: Diagnosis not present

## 2023-04-11 DIAGNOSIS — N186 End stage renal disease: Secondary | ICD-10-CM | POA: Diagnosis not present

## 2023-04-11 DIAGNOSIS — N2581 Secondary hyperparathyroidism of renal origin: Secondary | ICD-10-CM | POA: Diagnosis not present

## 2023-04-15 DIAGNOSIS — N186 End stage renal disease: Secondary | ICD-10-CM | POA: Diagnosis not present

## 2023-04-15 DIAGNOSIS — Z992 Dependence on renal dialysis: Secondary | ICD-10-CM | POA: Diagnosis not present

## 2023-04-15 DIAGNOSIS — D631 Anemia in chronic kidney disease: Secondary | ICD-10-CM | POA: Diagnosis not present

## 2023-04-15 DIAGNOSIS — N2581 Secondary hyperparathyroidism of renal origin: Secondary | ICD-10-CM | POA: Diagnosis not present

## 2023-04-18 DIAGNOSIS — D631 Anemia in chronic kidney disease: Secondary | ICD-10-CM | POA: Diagnosis not present

## 2023-04-18 DIAGNOSIS — N2581 Secondary hyperparathyroidism of renal origin: Secondary | ICD-10-CM | POA: Diagnosis not present

## 2023-04-18 DIAGNOSIS — Z992 Dependence on renal dialysis: Secondary | ICD-10-CM | POA: Diagnosis not present

## 2023-04-18 DIAGNOSIS — N186 End stage renal disease: Secondary | ICD-10-CM | POA: Diagnosis not present

## 2023-04-20 DIAGNOSIS — N186 End stage renal disease: Secondary | ICD-10-CM | POA: Diagnosis not present

## 2023-04-20 DIAGNOSIS — Z992 Dependence on renal dialysis: Secondary | ICD-10-CM | POA: Diagnosis not present

## 2023-04-20 DIAGNOSIS — N2581 Secondary hyperparathyroidism of renal origin: Secondary | ICD-10-CM | POA: Diagnosis not present

## 2023-04-20 DIAGNOSIS — D631 Anemia in chronic kidney disease: Secondary | ICD-10-CM | POA: Diagnosis not present

## 2023-04-22 DIAGNOSIS — N186 End stage renal disease: Secondary | ICD-10-CM | POA: Diagnosis not present

## 2023-04-22 DIAGNOSIS — Z992 Dependence on renal dialysis: Secondary | ICD-10-CM | POA: Diagnosis not present

## 2023-04-22 DIAGNOSIS — D631 Anemia in chronic kidney disease: Secondary | ICD-10-CM | POA: Diagnosis not present

## 2023-04-22 DIAGNOSIS — N2581 Secondary hyperparathyroidism of renal origin: Secondary | ICD-10-CM | POA: Diagnosis not present

## 2023-04-24 DIAGNOSIS — N2581 Secondary hyperparathyroidism of renal origin: Secondary | ICD-10-CM | POA: Diagnosis not present

## 2023-04-24 DIAGNOSIS — N186 End stage renal disease: Secondary | ICD-10-CM | POA: Diagnosis not present

## 2023-04-24 DIAGNOSIS — Z992 Dependence on renal dialysis: Secondary | ICD-10-CM | POA: Diagnosis not present

## 2023-04-24 DIAGNOSIS — D631 Anemia in chronic kidney disease: Secondary | ICD-10-CM | POA: Diagnosis not present

## 2023-04-27 DIAGNOSIS — D631 Anemia in chronic kidney disease: Secondary | ICD-10-CM | POA: Diagnosis not present

## 2023-04-27 DIAGNOSIS — N2581 Secondary hyperparathyroidism of renal origin: Secondary | ICD-10-CM | POA: Diagnosis not present

## 2023-04-27 DIAGNOSIS — Z992 Dependence on renal dialysis: Secondary | ICD-10-CM | POA: Diagnosis not present

## 2023-04-27 DIAGNOSIS — N186 End stage renal disease: Secondary | ICD-10-CM | POA: Diagnosis not present

## 2023-04-29 DIAGNOSIS — Z992 Dependence on renal dialysis: Secondary | ICD-10-CM | POA: Diagnosis not present

## 2023-04-29 DIAGNOSIS — N186 End stage renal disease: Secondary | ICD-10-CM | POA: Diagnosis not present

## 2023-04-29 DIAGNOSIS — N2581 Secondary hyperparathyroidism of renal origin: Secondary | ICD-10-CM | POA: Diagnosis not present

## 2023-04-29 DIAGNOSIS — D631 Anemia in chronic kidney disease: Secondary | ICD-10-CM | POA: Diagnosis not present

## 2023-05-02 DIAGNOSIS — Z992 Dependence on renal dialysis: Secondary | ICD-10-CM | POA: Diagnosis not present

## 2023-05-02 DIAGNOSIS — Q612 Polycystic kidney, adult type: Secondary | ICD-10-CM | POA: Diagnosis not present

## 2023-05-02 DIAGNOSIS — N186 End stage renal disease: Secondary | ICD-10-CM | POA: Diagnosis not present

## 2023-05-04 DIAGNOSIS — Z992 Dependence on renal dialysis: Secondary | ICD-10-CM | POA: Diagnosis not present

## 2023-05-04 DIAGNOSIS — N2581 Secondary hyperparathyroidism of renal origin: Secondary | ICD-10-CM | POA: Diagnosis not present

## 2023-05-04 DIAGNOSIS — N186 End stage renal disease: Secondary | ICD-10-CM | POA: Diagnosis not present

## 2023-05-04 DIAGNOSIS — D631 Anemia in chronic kidney disease: Secondary | ICD-10-CM | POA: Diagnosis not present

## 2023-05-06 DIAGNOSIS — N186 End stage renal disease: Secondary | ICD-10-CM | POA: Diagnosis not present

## 2023-05-06 DIAGNOSIS — Z992 Dependence on renal dialysis: Secondary | ICD-10-CM | POA: Diagnosis not present

## 2023-05-06 DIAGNOSIS — N2581 Secondary hyperparathyroidism of renal origin: Secondary | ICD-10-CM | POA: Diagnosis not present

## 2023-05-06 DIAGNOSIS — D631 Anemia in chronic kidney disease: Secondary | ICD-10-CM | POA: Diagnosis not present

## 2023-05-08 ENCOUNTER — Ambulatory Visit (HOSPITAL_COMMUNITY)
Admission: RE | Admit: 2023-05-08 | Discharge: 2023-05-08 | Disposition: A | Discharge status: Home / Self Care | Payer: BC Managed Care – PPO | Attending: Internal Medicine | Admitting: Internal Medicine

## 2023-05-08 ENCOUNTER — Other Ambulatory Visit: Payer: Self-pay

## 2023-05-08 ENCOUNTER — Encounter (HOSPITAL_COMMUNITY)
Admission: RE | Disposition: A | Discharge status: Home / Self Care | Payer: Self-pay | Source: Home / Self Care | Attending: Internal Medicine

## 2023-05-08 DIAGNOSIS — Z992 Dependence on renal dialysis: Secondary | ICD-10-CM | POA: Insufficient documentation

## 2023-05-08 DIAGNOSIS — I428 Other cardiomyopathies: Secondary | ICD-10-CM | POA: Diagnosis not present

## 2023-05-08 DIAGNOSIS — I120 Hypertensive chronic kidney disease with stage 5 chronic kidney disease or end stage renal disease: Secondary | ICD-10-CM | POA: Insufficient documentation

## 2023-05-08 DIAGNOSIS — T8249XA Other complication of vascular dialysis catheter, initial encounter: Secondary | ICD-10-CM | POA: Diagnosis not present

## 2023-05-08 DIAGNOSIS — N186 End stage renal disease: Secondary | ICD-10-CM | POA: Diagnosis not present

## 2023-05-08 DIAGNOSIS — Y839 Surgical procedure, unspecified as the cause of abnormal reaction of the patient, or of later complication, without mention of misadventure at the time of the procedure: Secondary | ICD-10-CM | POA: Insufficient documentation

## 2023-05-08 DIAGNOSIS — G4733 Obstructive sleep apnea (adult) (pediatric): Secondary | ICD-10-CM | POA: Insufficient documentation

## 2023-05-08 DIAGNOSIS — E785 Hyperlipidemia, unspecified: Secondary | ICD-10-CM | POA: Insufficient documentation

## 2023-05-08 HISTORY — PX: DIALYSIS/PERMA CATHETER INSERTION: CATH118288

## 2023-05-08 HISTORY — PX: PERIPHERAL VASCULAR BALLOON ANGIOPLASTY: CATH118281

## 2023-05-08 HISTORY — PX: DIALYSIS/PERMA CATHETER REMOVAL: CATH118289

## 2023-05-08 SURGERY — DIALYSIS/PERMA CATHETER REMOVAL
Anesthesia: LOCAL

## 2023-05-08 MED ORDER — IODIXANOL 320 MG/ML IV SOLN
INTRAVENOUS | Status: DC | PRN
  Administered 2023-05-08: 4 mL

## 2023-05-08 MED ORDER — LIDOCAINE HCL (PF) 1 % IJ SOLN
INTRAMUSCULAR | Status: DC | PRN
  Administered 2023-05-08: 10 mL

## 2023-05-08 MED ORDER — HEPARIN (PORCINE) IN NACL 1000-0.9 UT/500ML-% IV SOLN
INTRAVENOUS | Status: DC | PRN
  Administered 2023-05-08: 500 mL

## 2023-05-08 MED ORDER — ACETAMINOPHEN 325 MG PO TABS: 650.0000 mg | ORAL_TABLET | ORAL | Status: DC | PRN

## 2023-05-08 MED ORDER — ONDANSETRON HCL 4 MG/2ML IJ SOLN: 4.0000 mg | Freq: Four times a day (QID) | INTRAMUSCULAR | Status: DC | PRN

## 2023-05-08 MED ORDER — SODIUM CHLORIDE 0.9 % IV SOLN: INTRAVENOUS | Status: DC

## 2023-05-08 MED ORDER — LIDOCAINE HCL (PF) 1 % IJ SOLN
INTRAMUSCULAR | Status: AC
  Filled 2023-05-08: qty 30

## 2023-05-08 MED ORDER — HEPARIN SODIUM (PORCINE) 1000 UNIT/ML IJ SOLN
INTRAMUSCULAR | Status: DC | PRN
  Administered 2023-05-08 (×2): 1900 [IU] via INTRAVENOUS

## 2023-05-08 SURGICAL SUPPLY — 7 items
BALLN MUSTANG 8X80X75 (BALLOONS) ×2 IMPLANT
BALLOON MUSTANG 8X80X75 (BALLOONS) IMPLANT
CATH PALINDROME-P 23 W/VT (CATHETERS) IMPLANT
COVER DOME SNAP 22 D (MISCELLANEOUS) IMPLANT
GUIDEWIRE ANGLED .035X150CM (WIRE) IMPLANT
SYR MEDALLION 10ML (SYRINGE) IMPLANT
TRAY PV CATH (CUSTOM PROCEDURE TRAY) ×3 IMPLANT

## 2023-05-08 NOTE — Discharge Instructions (Signed)
 General care instructions: - You should be able to eat, drink, and resume your normal medications. - Avoid any strenuous activity for the remainder of the day. Potential complications: - You are bleeding at the exit or venotomy site and it will not stop with direct pressure; if there is a slow ooze apply pressure over the venotomy site neck region where the catheter can be felt for 5 minutes.  - You have a fever, swelling, see redness or feel heat over the tunnel or exit site. 3.  Medication instructions: - Continue routine medications unless otherwise instructed. 4. Please have your sutures removed at the hub site in 3 weeks at dialysis.

## 2023-05-08 NOTE — H&P (Signed)
 [facility]  Dialysis Access Intervention H&P  Reason for Consultation: dialysis access malfunction Requesting Provider: Dr. Rayburn  HPI: Michael [name]. is an 68 y.o. male with ESRD on MWF, HTN, HL, NICM, h/o OSA but not on CPAP who presents for dialysis catheter dysfunction.    Pt states TDC was placed at [facility] months ago and recently has been having poor flows despite tPA.  He's had no recent bacteremia or exit site issues.   PMH: Past Medical History:  Diagnosis Date   Atrial arrhythmia    atrial tachycardia with variable AV conduction versus atypical aflutter 01/10/19, rate control (02/06/19)   Cataract    Dyspnea    on exertion   ESRD (end stage renal disease) (HCC)    Hemo- MWF, Polycystic kidney disease   Fatigue    History of kidney stones    removal of stone- cysto   Hyperlipidemia    Hyperparathyroidism, secondary renal (HCC)    Hypertension    Hypoxemia 12/12/2013   Nonischemic cardiomyopathy (HCC)    Er 25% 2015, 55 % 2013   OSA on CPAP    no longer using cpap   OSA on CPAP 03/24/2014   Pneumonia    2015ish   Prostate cancer (HCC)    Ventricular tachycardia//Freq PVCs    Wears glasses    PSH: Past Surgical History:  Procedure Laterality Date   A-FLUTTER ABLATION N/A 04/11/2019   Procedure: A-FLUTTER ABLATION;  Surgeon: Waddell Danelle ORN, MD;  Location: [facility] INVASIVE CV LAB;  Service: Cardiovascular;  Laterality: N/A;   A/V FISTULAGRAM Left 04/27/2017   Procedure: A/V FISTULAGRAM;  Surgeon: Laurence Redell CROME, MD;  Location: [facility] INVASIVE CV LAB;  Service: Cardiovascular;  Laterality: Left;  lt arm   A/V FISTULAGRAM Left 01/10/2019   Procedure: A/V FISTULAGRAM;  Surgeon: Gretta Lonni PARAS, MD;  Location: [facility] INVASIVE CV LAB;  Service: Cardiovascular;  Laterality: Left;   APPENDECTOMY     AV FISTULA PLACEMENT  12/05/2011   Procedure: ARTERIOVENOUS (AV) FISTULA CREATION;LLEFT ARM  Surgeon: Redell CROME Laurence, MD;  Location: [facility] OR;  Service: Vascular;   Laterality: Left;  RADIO-CEPHALIC  fistula left arm   AV FISTULA PLACEMENT  01/11/2012   Procedure: ARTERIOVENOUS (AV) FISTULA CREATION;  Surgeon: Redell CROME Laurence, MD;  Location: [facility] OR;  Service: Vascular;  Laterality: Left;  Creation of left brachial cephalic arteriovenous fistula   AV FISTULA PLACEMENT Right 03/07/2019   Procedure: ARTERIOVENOUS (AV) FISTULA CREATION  RIGHT ARM;  Surgeon: Gretta Lonni PARAS, MD;  Location: [facility] OR;  Service: Vascular;  Laterality: Right;   BASCILIC VEIN TRANSPOSITION Left 12/27/2016   Procedure: BASILIC VEIN TRANSPOSITION LEFT UPPER ARM FIRST STAGE;  Surgeon: Laurence Redell CROME, MD;  Location: [facility] OR;  Service: Vascular;  Laterality: Left;   BASCILIC VEIN TRANSPOSITION Left 01/31/2017   Procedure: LEFT ARM BASILIC VEIN TRANSPOSITION, SECOND STAGE;  Surgeon: Laurence Redell CROME, MD;  Location: [facility] OR;  Service: Vascular;  Laterality: Left;   CARDIAC CATHETERIZATION  04-05-2010   checking for blockage but none-[facility]   COLONOSCOPY     CYSTOSCOPY W/ STONE MANIPULATION     laser once (01/22/2013)   HEMATOMA EVACUATION Left 05/09/2017   Procedure: EVACUATION HEMATOMA LEFT ARM;  Surgeon: Laurence Redell CROME, MD;  Location: [facility] OR;  Service: Vascular;  Laterality: Left;   HERNIA REPAIR     INGUINAL HERNIA REPAIR Right 11/06/2015   Procedure: OPEN HERNIA REPAIR  RIGHT INGUINAL ADULT;  Surgeon: Donnice Lunger, MD;  Location: [facility];  Service: General;  Laterality: Right;  with MESH   INSERTION OF DIALYSIS CATHETER Right 10/05/2016   Procedure: INSERTION OF right internal jugular DIALYSIS CATHETER;  Surgeon: Oris Krystal FALCON, MD;  Location: [facility] OR;  Service: Vascular;  Laterality: Right;   INSERTION OF MESH  03/20/2012   Procedure: INSERTION OF MESH;  JADE Surgeon: Donnice Bury, MD;  Location: [facility] OR;  Service: General;  Laterality: N/A;   INSERTION OF MESH N/A 01/22/2013   Procedure: INSERTION OF MESH;  Surgeon: Donnice Bury, MD;  Location: [facility] OR;  Service: General;  Laterality: N/A;    LAPAROTOMY  04/02/2012   Procedure: EXPLORATORY LAPAROTOMY;  Surgeon: Donnice Bury, MD;  Location: [facility] OR;  Service: General;  Laterality: N/A;  Exploratory Laparotomy with resection of small intestine   LEFT HEART CATHETERIZATION WITH CORONARY ANGIOGRAM N/A 05/14/2013   Procedure: LEFT HEART CATHETERIZATION WITH CORONARY ANGIOGRAM;  Surgeon: Victory LELON Claudene DOUGLAS, MD;  Location: [facility] CATH LAB;  Service: Cardiovascular;  Laterality: N/A;   LIGATION OF ARTERIOVENOUS  FISTULA Left 12/27/2016   Procedure: LIGATION/EXCISION OF LEFT UPPER ARM ARTERIOVENOUS  FISTULA;  EVACUATION OF HEMATOMA;  Surgeon: Laurence Redell CROME, MD;  Location: [facility] OR;  Service: Vascular;  Laterality: Left;   LIGATION OF ARTERIOVENOUS  FISTULA Left 03/07/2019   Procedure: LIGATION OF ARTERIOVENOUS FISTULA  LEFT UPPER ARM;  Surgeon: Gretta Lonni PARAS, MD;  Location: [facility] OR;  Service: Vascular;  Laterality: Left;   REVISON OF ARTERIOVENOUS FISTULA Left 10/05/2016   Procedure: REVISON OF left arm ARTERIOVENOUS FISTULA;  Surgeon: Oris Krystal FALCON, MD;  Location: [facility] OR;  Service: Vascular;  Laterality: Left;   TONSILLECTOMY     UMBILICAL HERNIA REPAIR  03/20/2012   Procedure: HERNIA REPAIR UMBILICAL ADULT;  Surgeon: Donnice Bury, MD;  Location: [facility] OR;  Service: General;  Laterality: N/A;   UMBILICAL HERNIA REPAIR  01/22/2013   preperitoneal open procedure due to significant adhesions/notes 01/22/2013   VENTRAL HERNIA REPAIR N/A 01/22/2013   Procedure: ATTEMPTED LAPAROSCOPIC VENTRAL HERNIA CONVERTED TO OPEN;  Surgeon: Donnice Bury, MD;  Location: [facility] OR;  Service: General;  Laterality: N/A;   Past Medical History:  Diagnosis Date   Atrial arrhythmia    atrial tachycardia with variable AV conduction versus atypical aflutter 01/10/19, rate control (02/06/19)   Cataract    Dyspnea    on exertion   ESRD (end stage renal disease) (HCC)    Hemo- MWF, Polycystic kidney disease   Fatigue    History of kidney stones    removal of stone- cysto    Hyperlipidemia    Hyperparathyroidism, secondary renal (HCC)    Hypertension    Hypoxemia 12/12/2013   Nonischemic cardiomyopathy (HCC)    Er 25% 2015, 55 % 2013   OSA on CPAP    no longer using cpap   OSA on CPAP 03/24/2014   Pneumonia    2015ish   Prostate cancer [facility])    Ventricular tachycardia//Freq PVCs    Wears glasses     Medications:  I have reviewed the patient's current medications.  Medications Prior to Admission  Medication Sig Dispense Refill   apixaban  (ELIQUIS ) 5 MG TABS tablet Take 1 tablet by mouth twice daily 180 tablet 1   cinacalcet  (SENSIPAR ) 30 MG tablet Take 30 mg by mouth daily.     dorzolamide -timolol  (COSOPT ) 2-0.5 % ophthalmic solution Place 1 drop into the left eye 2 (two) times daily. 10 mL 1   Evolocumab  (REPATHA  SURECLICK) 140 MG/ML SOAJ Inject 140  mg into the skin every 14 (fourteen) days. 2 mL 2   losartan  (COZAAR ) 25 MG tablet TAKE 1 TABLET BY MOUTH AT BEDTIME 90 tablet 3   metoprolol  succinate (TOPROL -XL) 25 MG 24 hr tablet TAKE 1 TABLET BY MOUTH AT BEDTIME 90 tablet 3   nitroGLYCERIN  (NITROSTAT ) 0.4 MG SL tablet Place 1 tablet (0.4 mg total) under the tongue every 5 (five) minutes x 3 doses as needed for chest pain. 30 tablet 12   rosuvastatin  (CRESTOR ) 10 MG tablet Take 1 tablet (10 mg total) by mouth daily. 90 tablet 3   sevelamer  carbonate (RENVELA ) 800 MG tablet Take 1,600 mg by mouth 3 (three) times daily with meals.     spironolactone  (ALDACTONE ) 25 MG tablet Take 25 mg by mouth daily.      ALLERGIES:   Allergies  Allergen Reactions   Colchicine Other (See Comments)    Other reaction(s): foot pain   Indapamide Other (See Comments)    unknown   Lipitor [Atorvastatin ] Itching    Leg pain   Viagra [Sildenafil] Other (See Comments)    Feels weird - Levitra. - headache   Zyloprim [Allopurinol] Other (See Comments)    foot pain    FAM HX: Family History  Problem Relation Age of Onset   Heart disease Mother    Hyperlipidemia  Mother    Hypertension Mother    Kidney disease Father    Stroke Father    Kidney disease Brother    Amblyopia Neg Hx    Blindness Neg Hx    Cataracts Neg Hx    Diabetes Neg Hx    Glaucoma Neg Hx    Macular degeneration Neg Hx    Retinal detachment Neg Hx    Strabismus Neg Hx    Retinitis pigmentosa Neg Hx    Breast cancer Neg Hx    Prostate cancer Neg Hx    Colon cancer Neg Hx    Pancreatic cancer Neg Hx     Social History:   reports that he has never smoked. He has never used smokeless tobacco. He reports that he does not drink alcohol  and does not use drugs.  ROS: 12 system ROS neg except per HPI  Blood pressure 116/79, pulse (!) 58, resp. rate 16, height 5' 7 (1.702 m), weight 81.6 kg, SpO2 100%. PHYSICAL EXAM: Gen: well appearing  Eyes: glasses ENT: MMM Neck: RIJ TDC c/d/i CV: RRR, BBB noted on monitor Lungs: O2 sat normal on RA Extr: no edema Neuro: conversant    No results found for this or any previous visit (from the past 48 hours).  No results found.  Assessment/Plan Tavoris Brisk. is an 68 y.o. male with ESRD on MWF, HTN, HL, NICM, h/o OSA but not on CPAP who presents for dialysis catheter dysfunction.    **ESRD with dialysis access  malfunction: TDC poorly functioning at dialysis, pt consented for exchange today with central venogram and angioplasty if needed.  He is on eliquis  and understands risk for bruising/bleeding.  He has a history of severe reaction to anesthesia and requests only local lidocaine  today which we will do.    Manuelita DELENA Barters 05/08/2023, 1:45 PM

## 2023-05-08 NOTE — Op Note (Signed)
 Patient presents with poor flows in his right IJ tunneled hemodialysis catheter (23 cm cuff to tip- Palindrome) that was placed here 2 months ago. On examination, aspiration from both ports is good however flushing from the arterial port is sluggish (alteplase  instilled yesterday). Chest x-ray confirms the catheter tip is appropriately positioned in the RA. The catheter cuff is 1/2cm deep to the exit site.    Summary:  1) The patient had a successful 23 cm CTT Palindrome hemodialysis catheter exchange in the right internal jugular vein. 2) No sheath noted through either port. 3) Okay to use catheter immediately.  Description of procedure: The right neck, chest and the catheter were prepped and draped in the usual sterile fashion. The exit site and adjacent tunnel tract were anesthetized with lidocaine  1% with epinephrine . The cuff was dissected free with a curved Kelly and manual traction. The catheter was withdrawn and venogram through each of the ports was performed.  There was evidence of an 80% central venous restriction at the innominate/SVC junction.  This was likely the etiology of the dysfunction.    A hydrophilic wire was manipulated and advanced through the arterial port and the tip was parked in the IVC.  The catheter was completely removed and noted to be entirely intact. Next an 8x73mm mustang angioplasty balloon was passed over the wire to the site of the central venous restriction.  Angioplasty was performed to full effacement at 16 ATM.  The balloon was withdrawn.   A new 23 cm cuff to tip Palindrome catheter was inserted over the guidewire and the tip parked in the right atrium and at that point the cuff was approximately 1 cm deep to the exit site.   Aspiration and flushing of both limbs of the catheter confirmed excellent flow. No kinks were visible on fluoroscopic imaging. Both limbs of the catheter were locked with heparin  and sterile caps were placed.   The hub was anchored on to  the chest wall with 2-0 nylon wing sutures. A chromic gut was used to add a purse string suture secure the exit site.   Sterile dressings were placed, and the patient returned to recovery in stable condition.  Sedation: none Sedation time: n/a  Contrast: 4 mL  Monitoring: Because of the patient's comorbid conditions and sedation during the procedure, continuous EKG monitoring and O2 saturation monitoring was performed throughout the procedure by the RN. There were no abnormal arrhythmias encountered.  Complications: None.  Diagnoses:   T82.49XA Other complication of vascular dialysis catheter (Poor flows) N18.6 End stage renal disease  Z99.2 Dialysis dependence  Procedures Coding:  36581 Tunneled catheter exchange 37248 Central venous angioplasty  77001  Fluoroscopy guidance for catheter exchange. V0037 Contrast  Recommendations: Remove the suture in 3 weeks. 2.   Report any blood flow problems to [facility].  Discharge: The patient was discharged home in stable condition. The patient was given education regarding the care of the catheter and specific instructions in case of any problems.

## 2023-05-09 ENCOUNTER — Encounter (HOSPITAL_COMMUNITY): Payer: Self-pay | Admitting: Internal Medicine

## 2023-05-09 DIAGNOSIS — N186 End stage renal disease: Secondary | ICD-10-CM | POA: Diagnosis not present

## 2023-05-09 DIAGNOSIS — H40021 Open angle with borderline findings, high risk, right eye: Secondary | ICD-10-CM | POA: Diagnosis not present

## 2023-05-09 DIAGNOSIS — H4052X4 Glaucoma secondary to other eye disorders, left eye, indeterminate stage: Secondary | ICD-10-CM | POA: Diagnosis not present

## 2023-05-09 DIAGNOSIS — H348121 Central retinal vein occlusion, left eye, with retinal neovascularization: Secondary | ICD-10-CM | POA: Diagnosis not present

## 2023-05-09 DIAGNOSIS — D631 Anemia in chronic kidney disease: Secondary | ICD-10-CM | POA: Diagnosis not present

## 2023-05-09 DIAGNOSIS — Z992 Dependence on renal dialysis: Secondary | ICD-10-CM | POA: Diagnosis not present

## 2023-05-09 DIAGNOSIS — N2581 Secondary hyperparathyroidism of renal origin: Secondary | ICD-10-CM | POA: Diagnosis not present

## 2023-05-09 DIAGNOSIS — H25811 Combined forms of age-related cataract, right eye: Secondary | ICD-10-CM | POA: Diagnosis not present

## 2023-05-11 DIAGNOSIS — I480 Paroxysmal atrial fibrillation: Secondary | ICD-10-CM | POA: Diagnosis not present

## 2023-05-11 DIAGNOSIS — Z961 Presence of intraocular lens: Secondary | ICD-10-CM | POA: Diagnosis not present

## 2023-05-11 DIAGNOSIS — N186 End stage renal disease: Secondary | ICD-10-CM | POA: Diagnosis not present

## 2023-05-11 DIAGNOSIS — H25811 Combined forms of age-related cataract, right eye: Secondary | ICD-10-CM | POA: Diagnosis not present

## 2023-05-11 DIAGNOSIS — I5020 Unspecified systolic (congestive) heart failure: Secondary | ICD-10-CM | POA: Diagnosis not present

## 2023-05-11 DIAGNOSIS — Z7901 Long term (current) use of anticoagulants: Secondary | ICD-10-CM | POA: Diagnosis not present

## 2023-05-11 DIAGNOSIS — Z9842 Cataract extraction status, left eye: Secondary | ICD-10-CM | POA: Diagnosis not present

## 2023-05-11 DIAGNOSIS — I132 Hypertensive heart and chronic kidney disease with heart failure and with stage 5 chronic kidney disease, or end stage renal disease: Secondary | ICD-10-CM | POA: Diagnosis not present

## 2023-05-11 DIAGNOSIS — Z992 Dependence on renal dialysis: Secondary | ICD-10-CM | POA: Diagnosis not present

## 2023-05-12 DIAGNOSIS — D631 Anemia in chronic kidney disease: Secondary | ICD-10-CM | POA: Diagnosis not present

## 2023-05-12 DIAGNOSIS — N2581 Secondary hyperparathyroidism of renal origin: Secondary | ICD-10-CM | POA: Diagnosis not present

## 2023-05-12 DIAGNOSIS — N186 End stage renal disease: Secondary | ICD-10-CM | POA: Diagnosis not present

## 2023-05-12 DIAGNOSIS — Z992 Dependence on renal dialysis: Secondary | ICD-10-CM | POA: Diagnosis not present

## 2023-05-16 DIAGNOSIS — D631 Anemia in chronic kidney disease: Secondary | ICD-10-CM | POA: Diagnosis not present

## 2023-05-16 DIAGNOSIS — N186 End stage renal disease: Secondary | ICD-10-CM | POA: Diagnosis not present

## 2023-05-16 DIAGNOSIS — N2581 Secondary hyperparathyroidism of renal origin: Secondary | ICD-10-CM | POA: Diagnosis not present

## 2023-05-16 DIAGNOSIS — Z992 Dependence on renal dialysis: Secondary | ICD-10-CM | POA: Diagnosis not present

## 2023-05-18 DIAGNOSIS — Z992 Dependence on renal dialysis: Secondary | ICD-10-CM | POA: Diagnosis not present

## 2023-05-18 DIAGNOSIS — D631 Anemia in chronic kidney disease: Secondary | ICD-10-CM | POA: Diagnosis not present

## 2023-05-18 DIAGNOSIS — N2581 Secondary hyperparathyroidism of renal origin: Secondary | ICD-10-CM | POA: Diagnosis not present

## 2023-05-18 DIAGNOSIS — N186 End stage renal disease: Secondary | ICD-10-CM | POA: Diagnosis not present

## 2023-05-20 DIAGNOSIS — Z992 Dependence on renal dialysis: Secondary | ICD-10-CM | POA: Diagnosis not present

## 2023-05-20 DIAGNOSIS — N186 End stage renal disease: Secondary | ICD-10-CM | POA: Diagnosis not present

## 2023-05-20 DIAGNOSIS — D631 Anemia in chronic kidney disease: Secondary | ICD-10-CM | POA: Diagnosis not present

## 2023-05-20 DIAGNOSIS — N2581 Secondary hyperparathyroidism of renal origin: Secondary | ICD-10-CM | POA: Diagnosis not present

## 2023-05-23 DIAGNOSIS — Z992 Dependence on renal dialysis: Secondary | ICD-10-CM | POA: Diagnosis not present

## 2023-05-23 DIAGNOSIS — N2581 Secondary hyperparathyroidism of renal origin: Secondary | ICD-10-CM | POA: Diagnosis not present

## 2023-05-23 DIAGNOSIS — N186 End stage renal disease: Secondary | ICD-10-CM | POA: Diagnosis not present

## 2023-05-23 DIAGNOSIS — D631 Anemia in chronic kidney disease: Secondary | ICD-10-CM | POA: Diagnosis not present

## 2023-05-25 DIAGNOSIS — N2581 Secondary hyperparathyroidism of renal origin: Secondary | ICD-10-CM | POA: Diagnosis not present

## 2023-05-25 DIAGNOSIS — Z992 Dependence on renal dialysis: Secondary | ICD-10-CM | POA: Diagnosis not present

## 2023-05-25 DIAGNOSIS — N186 End stage renal disease: Secondary | ICD-10-CM | POA: Diagnosis not present

## 2023-05-25 DIAGNOSIS — D631 Anemia in chronic kidney disease: Secondary | ICD-10-CM | POA: Diagnosis not present

## 2023-05-27 DIAGNOSIS — N186 End stage renal disease: Secondary | ICD-10-CM | POA: Diagnosis not present

## 2023-05-27 DIAGNOSIS — Z992 Dependence on renal dialysis: Secondary | ICD-10-CM | POA: Diagnosis not present

## 2023-05-27 DIAGNOSIS — N2581 Secondary hyperparathyroidism of renal origin: Secondary | ICD-10-CM | POA: Diagnosis not present

## 2023-05-27 DIAGNOSIS — D631 Anemia in chronic kidney disease: Secondary | ICD-10-CM | POA: Diagnosis not present

## 2023-05-30 DIAGNOSIS — Z992 Dependence on renal dialysis: Secondary | ICD-10-CM | POA: Diagnosis not present

## 2023-05-30 DIAGNOSIS — N186 End stage renal disease: Secondary | ICD-10-CM | POA: Diagnosis not present

## 2023-05-30 DIAGNOSIS — D631 Anemia in chronic kidney disease: Secondary | ICD-10-CM | POA: Diagnosis not present

## 2023-05-30 DIAGNOSIS — N2581 Secondary hyperparathyroidism of renal origin: Secondary | ICD-10-CM | POA: Diagnosis not present

## 2023-06-01 DIAGNOSIS — N186 End stage renal disease: Secondary | ICD-10-CM | POA: Diagnosis not present

## 2023-06-01 DIAGNOSIS — Z992 Dependence on renal dialysis: Secondary | ICD-10-CM | POA: Diagnosis not present

## 2023-06-01 DIAGNOSIS — N2581 Secondary hyperparathyroidism of renal origin: Secondary | ICD-10-CM | POA: Diagnosis not present

## 2023-06-01 DIAGNOSIS — D631 Anemia in chronic kidney disease: Secondary | ICD-10-CM | POA: Diagnosis not present

## 2023-06-05 ENCOUNTER — Encounter (HOSPITAL_COMMUNITY): Payer: Self-pay | Admitting: Emergency Medicine

## 2023-06-05 ENCOUNTER — Observation Stay (HOSPITAL_COMMUNITY)
Admission: EM | Admit: 2023-06-05 | Discharge: 2023-06-07 | Disposition: A | Discharge status: Home / Self Care | Payer: BC Managed Care – PPO | Attending: Internal Medicine | Admitting: Internal Medicine

## 2023-06-05 ENCOUNTER — Other Ambulatory Visit: Payer: Self-pay

## 2023-06-05 ENCOUNTER — Emergency Department (HOSPITAL_COMMUNITY): Payer: Medicare Other

## 2023-06-05 DIAGNOSIS — D61818 Other pancytopenia: Secondary | ICD-10-CM | POA: Diagnosis not present

## 2023-06-05 DIAGNOSIS — I5022 Chronic systolic (congestive) heart failure: Secondary | ICD-10-CM | POA: Diagnosis present

## 2023-06-05 DIAGNOSIS — Q613 Polycystic kidney, unspecified: Secondary | ICD-10-CM | POA: Insufficient documentation

## 2023-06-05 DIAGNOSIS — N186 End stage renal disease: Principal | ICD-10-CM | POA: Diagnosis present

## 2023-06-05 DIAGNOSIS — Z992 Dependence on renal dialysis: Secondary | ICD-10-CM | POA: Insufficient documentation

## 2023-06-05 DIAGNOSIS — N2581 Secondary hyperparathyroidism of renal origin: Secondary | ICD-10-CM | POA: Diagnosis not present

## 2023-06-05 DIAGNOSIS — I132 Hypertensive heart and chronic kidney disease with heart failure and with stage 5 chronic kidney disease, or end stage renal disease: Secondary | ICD-10-CM | POA: Insufficient documentation

## 2023-06-05 DIAGNOSIS — T8242XA Displacement of vascular dialysis catheter, initial encounter: Secondary | ICD-10-CM | POA: Diagnosis not present

## 2023-06-05 DIAGNOSIS — I3139 Other pericardial effusion (noninflammatory): Secondary | ICD-10-CM | POA: Diagnosis present

## 2023-06-05 DIAGNOSIS — Z8546 Personal history of malignant neoplasm of prostate: Secondary | ICD-10-CM | POA: Insufficient documentation

## 2023-06-05 DIAGNOSIS — I4892 Unspecified atrial flutter: Secondary | ICD-10-CM | POA: Diagnosis not present

## 2023-06-05 DIAGNOSIS — T82898A Other specified complication of vascular prosthetic devices, implants and grafts, initial encounter: Secondary | ICD-10-CM | POA: Diagnosis present

## 2023-06-05 DIAGNOSIS — Z79899 Other long term (current) drug therapy: Secondary | ICD-10-CM | POA: Diagnosis not present

## 2023-06-05 DIAGNOSIS — I700 Atherosclerosis of aorta: Secondary | ICD-10-CM | POA: Insufficient documentation

## 2023-06-05 DIAGNOSIS — Z7901 Long term (current) use of anticoagulants: Secondary | ICD-10-CM | POA: Diagnosis not present

## 2023-06-05 DIAGNOSIS — Y839 Surgical procedure, unspecified as the cause of abnormal reaction of the patient, or of later complication, without mention of misadventure at the time of the procedure: Secondary | ICD-10-CM | POA: Insufficient documentation

## 2023-06-05 DIAGNOSIS — D696 Thrombocytopenia, unspecified: Secondary | ICD-10-CM | POA: Diagnosis present

## 2023-06-05 DIAGNOSIS — I4891 Unspecified atrial fibrillation: Secondary | ICD-10-CM | POA: Diagnosis not present

## 2023-06-05 DIAGNOSIS — I428 Other cardiomyopathies: Secondary | ICD-10-CM | POA: Diagnosis not present

## 2023-06-05 DIAGNOSIS — Q446 Cystic disease of liver: Secondary | ICD-10-CM | POA: Insufficient documentation

## 2023-06-05 NOTE — ED Triage Notes (Signed)
Patient report dialysis catheter fell out today. Patient report he fell out his bed last Saturday that might displace catheter. Patient dialysis site bleeding controlled.

## 2023-06-05 NOTE — ED Provider Triage Note (Signed)
 Emergency Medicine Provider Triage Evaluation Note  Jelan Batterton. , a 68 y.o. male  was evaluated in triage.  Pt complains of dialysis catheter problem. Patient presents to the ED with concerns of a broken/pulled CV catheter used for dialysis. This happened earlier today when the catheter fell out, but states this was caused by a fall on Saturday when he hit this area. He states that he had significant bleeding prior to arrival.  Review of Systems  Positive: As above Negative: As above  Physical Exam  BP 95/64   Pulse (!) 56   Temp 98 F (36.7 C)   Resp 16   Ht 5\' 7"  (1.702 m)   Wt 81.6 kg   SpO2 99%   BMI 28.18 kg/m  Gen:   Awake, no distress   Resp:  Normal effort  MSK:   Moves extremities without difficulty Other:    Medical Decision Making  Medically screening exam initiated at 5:59 PM.  Appropriate orders placed.  [name]. was informed that the remainder of the evaluation will be completed by another provider, this initial triage assessment does not replace that evaluation, and the importance of remaining in the ED until their evaluation is complete.     Smitty Knudsen, PA-C 06/05/23 1801

## 2023-06-06 ENCOUNTER — Inpatient Hospital Stay (HOSPITAL_COMMUNITY): Payer: BC Managed Care – PPO

## 2023-06-06 ENCOUNTER — Emergency Department (HOSPITAL_COMMUNITY): Payer: BC Managed Care – PPO

## 2023-06-06 ENCOUNTER — Encounter (HOSPITAL_COMMUNITY): Payer: Self-pay | Admitting: Family Medicine

## 2023-06-06 DIAGNOSIS — D631 Anemia in chronic kidney disease: Secondary | ICD-10-CM | POA: Diagnosis not present

## 2023-06-06 DIAGNOSIS — Q446 Cystic disease of liver: Secondary | ICD-10-CM | POA: Diagnosis not present

## 2023-06-06 DIAGNOSIS — D61818 Other pancytopenia: Secondary | ICD-10-CM | POA: Diagnosis not present

## 2023-06-06 DIAGNOSIS — I4892 Unspecified atrial flutter: Secondary | ICD-10-CM | POA: Diagnosis not present

## 2023-06-06 DIAGNOSIS — I4891 Unspecified atrial fibrillation: Secondary | ICD-10-CM | POA: Diagnosis not present

## 2023-06-06 DIAGNOSIS — T82898A Other specified complication of vascular prosthetic devices, implants and grafts, initial encounter: Secondary | ICD-10-CM | POA: Diagnosis present

## 2023-06-06 DIAGNOSIS — Z79899 Other long term (current) drug therapy: Secondary | ICD-10-CM | POA: Diagnosis not present

## 2023-06-06 DIAGNOSIS — I5022 Chronic systolic (congestive) heart failure: Secondary | ICD-10-CM | POA: Diagnosis not present

## 2023-06-06 DIAGNOSIS — N186 End stage renal disease: Secondary | ICD-10-CM | POA: Diagnosis not present

## 2023-06-06 DIAGNOSIS — I132 Hypertensive heart and chronic kidney disease with heart failure and with stage 5 chronic kidney disease, or end stage renal disease: Secondary | ICD-10-CM | POA: Diagnosis not present

## 2023-06-06 DIAGNOSIS — Z992 Dependence on renal dialysis: Secondary | ICD-10-CM | POA: Diagnosis not present

## 2023-06-06 DIAGNOSIS — N2581 Secondary hyperparathyroidism of renal origin: Secondary | ICD-10-CM | POA: Diagnosis not present

## 2023-06-06 DIAGNOSIS — I5020 Unspecified systolic (congestive) heart failure: Secondary | ICD-10-CM | POA: Diagnosis not present

## 2023-06-06 DIAGNOSIS — T8242XA Displacement of vascular dialysis catheter, initial encounter: Secondary | ICD-10-CM | POA: Diagnosis present

## 2023-06-06 DIAGNOSIS — D696 Thrombocytopenia, unspecified: Secondary | ICD-10-CM

## 2023-06-06 DIAGNOSIS — Z7901 Long term (current) use of anticoagulants: Secondary | ICD-10-CM | POA: Diagnosis not present

## 2023-06-06 DIAGNOSIS — Y839 Surgical procedure, unspecified as the cause of abnormal reaction of the patient, or of later complication, without mention of misadventure at the time of the procedure: Secondary | ICD-10-CM | POA: Diagnosis not present

## 2023-06-06 DIAGNOSIS — I3139 Other pericardial effusion (noninflammatory): Secondary | ICD-10-CM | POA: Diagnosis not present

## 2023-06-06 DIAGNOSIS — E8779 Other fluid overload: Secondary | ICD-10-CM | POA: Diagnosis not present

## 2023-06-06 DIAGNOSIS — I700 Atherosclerosis of aorta: Secondary | ICD-10-CM | POA: Diagnosis not present

## 2023-06-06 DIAGNOSIS — Q613 Polycystic kidney, unspecified: Secondary | ICD-10-CM | POA: Diagnosis not present

## 2023-06-06 DIAGNOSIS — I428 Other cardiomyopathies: Secondary | ICD-10-CM | POA: Diagnosis not present

## 2023-06-06 DIAGNOSIS — Z8546 Personal history of malignant neoplasm of prostate: Secondary | ICD-10-CM | POA: Diagnosis not present

## 2023-06-06 HISTORY — PX: IR FLUORO GUIDE CV LINE RIGHT: IMG2283

## 2023-06-06 HISTORY — PX: IR US GUIDE VASC ACCESS RIGHT: IMG2390

## 2023-06-06 LAB — BASIC METABOLIC PANEL
Anion gap: 14 (ref 5–15)
BUN: 45 mg/dL — ABNORMAL HIGH (ref 8–23)
CO2: 27 mmol/L (ref 22–32)
Calcium: 9.1 mg/dL (ref 8.9–10.3)
Chloride: 99 mmol/L (ref 98–111)
Creatinine, Ser: 10.29 mg/dL — ABNORMAL HIGH (ref 0.61–1.24)
GFR, Estimated: 5 mL/min — ABNORMAL LOW (ref >60)
Glucose, Bld: 72 mg/dL (ref 70–99)
Potassium: 4.4 mmol/L (ref 3.5–5.1)
Sodium: 140 mmol/L (ref 135–145)

## 2023-06-06 LAB — CBC WITH DIFFERENTIAL/PLATELET
Abs Immature Granulocytes: 0.01 10*3/uL (ref 0.00–0.07)
Basophils Absolute: 0.1 10*3/uL (ref 0.0–0.1)
Basophils Relative: 1 %
Eosinophils Absolute: 0.5 10*3/uL (ref 0.0–0.5)
Eosinophils Relative: 13 %
HCT: 41 % (ref 39.0–52.0)
Hemoglobin: 12.2 g/dL — ABNORMAL LOW (ref 13.0–17.0)
Immature Granulocytes: 0 %
Lymphocytes Relative: 6 %
Lymphs Abs: 0.2 10*3/uL — ABNORMAL LOW (ref 0.7–4.0)
MCH: 25.7 pg — ABNORMAL LOW (ref 26.0–34.0)
MCHC: 29.8 g/dL — ABNORMAL LOW (ref 30.0–36.0)
MCV: 86.3 fL (ref 80.0–100.0)
Monocytes Absolute: 0.4 10*3/uL (ref 0.1–1.0)
Monocytes Relative: 12 %
Neutro Abs: 2.3 10*3/uL (ref 1.7–7.7)
Neutrophils Relative %: 68 %
Platelets: 119 10*3/uL — ABNORMAL LOW (ref 150–400)
RBC: 4.75 MIL/uL (ref 4.22–5.81)
RDW: 15.1 % (ref 11.5–15.5)
WBC: 3.5 10*3/uL — ABNORMAL LOW (ref 4.0–10.5)
nRBC: 0 % (ref 0.0–0.2)

## 2023-06-06 LAB — HEPATITIS B SURFACE ANTIGEN: Hepatitis B Surface Ag: NONREACTIVE

## 2023-06-06 LAB — MRSA NEXT GEN BY PCR, NASAL: MRSA by PCR Next Gen: NOT DETECTED

## 2023-06-06 MED ORDER — HEPARIN SODIUM (PORCINE) 1000 UNIT/ML DIALYSIS: 1000.0000 [IU] | INTRAMUSCULAR | Status: DC | PRN

## 2023-06-06 MED ORDER — ACETAMINOPHEN 325 MG PO TABS: 650.0000 mg | ORAL_TABLET | Freq: Four times a day (QID) | ORAL | Status: DC | PRN

## 2023-06-06 MED ORDER — FENTANYL CITRATE (PF) 100 MCG/2ML IJ SOLN
INTRAMUSCULAR | Status: AC
  Filled 2023-06-06: qty 2

## 2023-06-06 MED ORDER — SODIUM CHLORIDE 0.9% FLUSH
3.0000 mL | Freq: Two times a day (BID) | INTRAVENOUS | Status: DC
  Administered 2023-06-06 – 2023-06-07 (×2): 3 mL via INTRAVENOUS

## 2023-06-06 MED ORDER — DIPHENHYDRAMINE-ZINC ACETATE 2-0.1 % EX CREA: TOPICAL_CREAM | Freq: Three times a day (TID) | CUTANEOUS | Status: DC | PRN

## 2023-06-06 MED ORDER — CALCITRIOL 0.5 MCG PO CAPS
1.5000 ug | ORAL_CAPSULE | ORAL | Status: DC
  Administered 2023-06-06: 1.5 ug via ORAL
  Filled 2023-06-06: qty 3

## 2023-06-06 MED ORDER — HEPARIN SODIUM (PORCINE) 1000 UNIT/ML DIALYSIS
3000.0000 [IU] | Freq: Once | INTRAMUSCULAR | Status: DC
  Filled 2023-06-06 (×2): qty 3

## 2023-06-06 MED ORDER — LOSARTAN POTASSIUM 25 MG PO TABS
25.0000 mg | ORAL_TABLET | Freq: Every day | ORAL | Status: DC
  Administered 2023-06-06: 25 mg via ORAL
  Filled 2023-06-06: qty 1

## 2023-06-06 MED ORDER — MIDAZOLAM HCL 2 MG/2ML IJ SOLN
INTRAMUSCULAR | Status: AC | PRN
  Administered 2023-06-06: 1 mg via INTRAVENOUS

## 2023-06-06 MED ORDER — MIDAZOLAM HCL 2 MG/2ML IJ SOLN
INTRAMUSCULAR | Status: AC
Start: 2023-06-06 — End: ?
  Filled 2023-06-06: qty 2

## 2023-06-06 MED ORDER — LIDOCAINE-PRILOCAINE 2.5-2.5 % EX CREA: 1.0000 | TOPICAL_CREAM | CUTANEOUS | Status: DC | PRN

## 2023-06-06 MED ORDER — LIDOCAINE-EPINEPHRINE 1 %-1:100000 IJ SOLN
INTRAMUSCULAR | Status: AC
  Filled 2023-06-06: qty 1

## 2023-06-06 MED ORDER — DORZOLAMIDE HCL-TIMOLOL MAL 2-0.5 % OP SOLN
1.0000 [drp] | Freq: Two times a day (BID) | OPHTHALMIC | Status: DC
  Administered 2023-06-06 – 2023-06-07 (×2): 1 [drp] via OPHTHALMIC
  Filled 2023-06-06: qty 10

## 2023-06-06 MED ORDER — PROCHLORPERAZINE EDISYLATE 10 MG/2ML IJ SOLN: 5.0000 mg | Freq: Four times a day (QID) | INTRAMUSCULAR | Status: DC | PRN

## 2023-06-06 MED ORDER — ANTICOAGULANT SODIUM CITRATE 4% (200MG/5ML) IV SOLN: 5.0000 mL | Status: DC | PRN

## 2023-06-06 MED ORDER — CEFAZOLIN SODIUM-DEXTROSE 2-4 GM/100ML-% IV SOLN
INTRAVENOUS | Status: AC
  Filled 2023-06-06: qty 100

## 2023-06-06 MED ORDER — NEPRO/CARBSTEADY PO LIQD: 237.0000 mL | ORAL | Status: DC | PRN

## 2023-06-06 MED ORDER — CHLORHEXIDINE GLUCONATE CLOTH 2 % EX PADS
6.0000 | MEDICATED_PAD | Freq: Every day | CUTANEOUS | Status: DC
Start: 2023-06-07 — End: 2023-06-07
  Administered 2023-06-07: 6 via TOPICAL

## 2023-06-06 MED ORDER — FENTANYL CITRATE (PF) 100 MCG/2ML IJ SOLN
INTRAMUSCULAR | Status: AC | PRN
  Administered 2023-06-06: 25 ug via INTRAVENOUS

## 2023-06-06 MED ORDER — METOPROLOL SUCCINATE ER 25 MG PO TB24
25.0000 mg | ORAL_TABLET | Freq: Every day | ORAL | Status: DC
  Administered 2023-06-06: 25 mg via ORAL
  Filled 2023-06-06: qty 1

## 2023-06-06 MED ORDER — SEVELAMER CARBONATE 800 MG PO TABS
800.0000 mg | ORAL_TABLET | Freq: Three times a day (TID) | ORAL | Status: DC
  Administered 2023-06-07: 800 mg via ORAL
  Filled 2023-06-06: qty 1

## 2023-06-06 MED ORDER — CINACALCET HCL 30 MG PO TABS
30.0000 mg | ORAL_TABLET | Freq: Every day | ORAL | Status: DC
  Administered 2023-06-06 – 2023-06-07 (×2): 30 mg via ORAL
  Filled 2023-06-06 (×2): qty 1

## 2023-06-06 MED ORDER — HEPARIN SODIUM (PORCINE) 1000 UNIT/ML IJ SOLN
INTRAMUSCULAR | Status: AC
Start: 2023-06-06 — End: ?
  Filled 2023-06-06: qty 10

## 2023-06-06 MED ORDER — PENTAFLUOROPROP-TETRAFLUOROETH EX AERO: 1.0000 | INHALATION_SPRAY | CUTANEOUS | Status: DC | PRN

## 2023-06-06 MED ORDER — ACETAMINOPHEN 650 MG RE SUPP: 650.0000 mg | Freq: Four times a day (QID) | RECTAL | Status: DC | PRN

## 2023-06-06 MED ORDER — ALTEPLASE 2 MG IJ SOLR: 2.0000 mg | Freq: Once | INTRAMUSCULAR | Status: DC | PRN

## 2023-06-06 MED ORDER — CEFAZOLIN SODIUM-DEXTROSE 2-4 GM/100ML-% IV SOLN
INTRAVENOUS | Status: AC | PRN
  Administered 2023-06-06: 2 g via INTRAVENOUS

## 2023-06-06 MED ORDER — SENNA 8.6 MG PO TABS: 1.0000 | ORAL_TABLET | Freq: Every day | ORAL | Status: DC | PRN

## 2023-06-06 MED ORDER — ROSUVASTATIN CALCIUM 5 MG PO TABS
10.0000 mg | ORAL_TABLET | Freq: Every day | ORAL | Status: DC
Start: 2023-06-06 — End: 2023-06-07
  Administered 2023-06-06 – 2023-06-07 (×2): 10 mg via ORAL
  Filled 2023-06-06: qty 1
  Filled 2023-06-06: qty 2

## 2023-06-06 MED ORDER — LIDOCAINE HCL (PF) 1 % IJ SOLN: 5.0000 mL | INTRAMUSCULAR | Status: DC | PRN

## 2023-06-06 NOTE — Consult Note (Signed)
 Renal Service Consult Note [facility]  Ival Pacer. 06/06/2023 Lamar JONETTA Fret, MD Requesting Physician: Dr Bryn  Reason for Consult: ESRD pt w/ dislodged HD catheter HPI: The patient is a 68 y.o. year-old w/ PMH as below who presented to ED last night reporting that his HD catheter came out. He fell out of bed a few nights earlier and they think it might have displaced the catheter that night; then last night when he rolled over the catheter had come out completely. His last HD was Thursday 1/30. Pt admitted and IR consulted. We are asked to see for dialysis.   Pt seen in room.  Family at bedside. Hx as above. No SOB, abd pain or CP as this time. 15 years on dialysis. Has had many AVF's.   ROS - denies CP, no joint pain, no HA, no blurry vision, no rash, no diarrhea, no nausea/ vomiting, no dysuria, no difficulty voiding   Past Medical History  Past Medical History:  Diagnosis Date   Atrial arrhythmia    atrial tachycardia with variable AV conduction versus atypical aflutter 01/10/19, rate control (02/06/19)   Cataract    Dyspnea    on exertion   ESRD (end stage renal disease) (HCC)    Hemo- MWF, Polycystic kidney disease   Fatigue    History of kidney stones    removal of stone- cysto   Hyperlipidemia    Hyperparathyroidism, secondary renal (HCC)    Hypertension    Hypoxemia 12/12/2013   Nonischemic cardiomyopathy (HCC)    Er 25% 2015, 55 % 2013   OSA on CPAP    no longer using cpap   OSA on CPAP 03/24/2014   Pneumonia    2015ish   Prostate cancer (HCC)    Ventricular tachycardia//Freq PVCs    Wears glasses    Past Surgical History  Past Surgical History:  Procedure Laterality Date   A-FLUTTER ABLATION N/A 04/11/2019   Procedure: A-FLUTTER ABLATION;  Surgeon: Waddell Danelle ORN, MD;  Location: [facility] INVASIVE CV LAB;  Service: Cardiovascular;  Laterality: N/A;   A/V FISTULAGRAM Left 04/27/2017   Procedure: A/V FISTULAGRAM;  Surgeon: Laurence Redell CROME, MD;   Location: [facility] INVASIVE CV LAB;  Service: Cardiovascular;  Laterality: Left;  lt arm   A/V FISTULAGRAM Left 01/10/2019   Procedure: A/V FISTULAGRAM;  Surgeon: Gretta Lonni PARAS, MD;  [facility] INVASIVE CV LAB;  Service: Cardiovascular;  Laterality: Left;   APPENDECTOMY     AV FISTULA PLACEMENT  12/05/2011   Procedure: ARTERIOVENOUS (AV) FISTULA CREATION;LLEFT ARM  Surgeon: Redell CROME Laurence, MD;  Location: [facility] OR;  Service: Vascular;  Laterality: Left;  RADIO-CEPHALIC  fistula left arm   AV FISTULA PLACEMENT  01/11/2012   Procedure: ARTERIOVENOUS (AV) FISTULA CREATION;  Surgeon: Redell CROME Laurence, MD;  Location: [facility] OR;  Service: Vascular;  Laterality: Left;  Creation of left brachial cephalic arteriovenous fistula   AV FISTULA PLACEMENT Right 03/07/2019   Procedure: ARTERIOVENOUS (AV) FISTULA CREATION  RIGHT ARM;  Surgeon: Gretta Lonni PARAS, MD;  Location: [facility] OR;  Service: Vascular;  Laterality: Right;   BASCILIC VEIN TRANSPOSITION Left 12/27/2016   Procedure: BASILIC VEIN TRANSPOSITION LEFT UPPER ARM FIRST STAGE;  Surgeon: Laurence Redell CROME, MD;  Location: [facility] OR;  Service: Vascular;  Laterality: Left;   BASCILIC VEIN TRANSPOSITION Left 01/31/2017   Procedure: LEFT ARM BASILIC VEIN TRANSPOSITION, SECOND STAGE;  Surgeon: Laurence Redell CROME, MD;  Location: [facility] OR;  Service: Vascular;  Laterality: Left;  CARDIAC CATHETERIZATION  04-05-2010   checking for blockage but none-[facility]   COLONOSCOPY     CYSTOSCOPY W/ STONE MANIPULATION     laser once (01/22/2013)   DIALYSIS/PERMA CATHETER INSERTION N/A 05/08/2023   Procedure: DIALYSIS/PERMA CATHETER INSERTION;  Surgeon: Norine Manuelita LABOR, MD;  Location: [facility] INVASIVE CV LAB;  Service: Cardiovascular;  Laterality: N/A;   DIALYSIS/PERMA CATHETER REMOVAL N/A 05/08/2023   Procedure: DIALYSIS/PERMA CATHETER REMOVAL;  Surgeon: Norine Manuelita LABOR, MD;  Location: [facility] INVASIVE CV LAB;  Service: Cardiovascular;  Laterality: N/A;   HEMATOMA EVACUATION Left 05/09/2017   Procedure: EVACUATION  HEMATOMA LEFT ARM;  Surgeon: Laurence Redell CROME, MD;  Location: [facility] OR;  Service: Vascular;  Laterality: Left;   HERNIA REPAIR     INGUINAL HERNIA REPAIR Right 11/06/2015   Procedure: OPEN HERNIA REPAIR  RIGHT INGUINAL ADULT;  Surgeon: Donnice Lunger, MD;  Location: [facility] ORS;  Service: General;  Laterality: Right;  with MESH   INSERTION OF DIALYSIS CATHETER Right 10/05/2016   Procedure: INSERTION OF right internal jugular DIALYSIS CATHETER;  Surgeon: Oris Krystal FALCON, MD;  Location: [facility] OR;  Service: Vascular;  Laterality: Right;   INSERTION OF MESH  03/20/2012   Procedure: INSERTION OF MESH;  JADE Surgeon: Donnice Bury, MD;  Location: [facility] OR;  Service: General;  Laterality: N/A;   INSERTION OF MESH N/A 01/22/2013   Procedure: INSERTION OF MESH;  Surgeon: Donnice Bury, MD;  Location: [facility] OR;  Service: General;  Laterality: N/A;   LAPAROTOMY  04/02/2012   Procedure: EXPLORATORY LAPAROTOMY;  Surgeon: Donnice Bury, MD;  Location: [facility] OR;  Service: General;  Laterality: N/A;  Exploratory Laparotomy with resection of small intestine   LEFT HEART CATHETERIZATION WITH CORONARY ANGIOGRAM N/A 05/14/2013   Procedure: LEFT HEART CATHETERIZATION WITH CORONARY ANGIOGRAM;  Surgeon: Victory LELON Claudene DOUGLAS, MD;  Location: [facility] CATH LAB;  Service: Cardiovascular;  Laterality: N/A;   LIGATION OF ARTERIOVENOUS  FISTULA Left 12/27/2016   Procedure: LIGATION/EXCISION OF LEFT UPPER ARM ARTERIOVENOUS  FISTULA;  EVACUATION OF HEMATOMA;  Surgeon: Laurence Redell CROME, MD;  Location: [facility] OR;  Service: Vascular;  Laterality: Left;   LIGATION OF ARTERIOVENOUS  FISTULA Left 03/07/2019   Procedure: LIGATION OF ARTERIOVENOUS FISTULA  LEFT UPPER ARM;  Surgeon: Gretta Lonni PARAS, MD;  Location: [facility] OR;  Service: Vascular;  Laterality: Left;   PERIPHERAL VASCULAR BALLOON ANGIOPLASTY  05/08/2023   Procedure: PERIPHERAL VASCULAR BALLOON ANGIOPLASTY;  Surgeon: Norine Manuelita LABOR, MD;  Location: [facility] INVASIVE CV LAB;  Service: Cardiovascular;;  innominate/ SVC    REVISON OF ARTERIOVENOUS FISTULA Left 10/05/2016   Procedure: REVISON OF left arm ARTERIOVENOUS FISTULA;  Surgeon: Oris Krystal FALCON, MD;  Location: [facility] OR;  Service: Vascular;  Laterality: Left;   TONSILLECTOMY     UMBILICAL HERNIA REPAIR  03/20/2012   Procedure: HERNIA REPAIR UMBILICAL ADULT;  Surgeon: Donnice Bury, MD;  Location: [facility] OR;  Service: General;  Laterality: N/A;   UMBILICAL HERNIA REPAIR  01/22/2013   preperitoneal open procedure due to significant adhesions/notes 01/22/2013   VENTRAL HERNIA REPAIR N/A 01/22/2013   Procedure: ATTEMPTED LAPAROSCOPIC VENTRAL HERNIA CONVERTED TO OPEN;  Surgeon: Donnice Bury, MD;  Location: [facility] OR;  Service: General;  Laterality: N/A;   Family History  Family History  Problem Relation Age of Onset   Heart disease Mother    Hyperlipidemia Mother    Hypertension Mother    Kidney disease Father    Stroke Father    Kidney disease Brother    Amblyopia Neg Hx  Blindness Neg Hx    Cataracts Neg Hx    Diabetes Neg Hx    Glaucoma Neg Hx    Macular degeneration Neg Hx    Retinal detachment Neg Hx    Strabismus Neg Hx    Retinitis pigmentosa Neg Hx    Breast cancer Neg Hx    Prostate cancer Neg Hx    Colon cancer Neg Hx    Pancreatic cancer Neg Hx    Social History  reports that he has never smoked. He has never used smokeless tobacco. He reports that he does not drink alcohol  and does not use drugs. Allergies  Allergies  Allergen Reactions   Colchicine Other (See Comments)    Other reaction(s): foot pain   Indapamide Other (See Comments)    unknown   Lipitor [Atorvastatin ] Itching    Leg pain   Viagra [Sildenafil] Other (See Comments)    Feels weird - Levitra. - headache   Zyloprim [Allopurinol] Other (See Comments)    foot pain   Home medications Prior to Admission medications   Medication Sig Start Date End Date Taking? Authorizing Provider  apixaban  (ELIQUIS ) 5 MG TABS tablet Take 1 tablet by mouth twice daily 06/27/22  Yes  Waddell Danelle ORN, MD  brimonidine  (ALPHAGAN ) 0.2 % ophthalmic solution Place 1 drop into both eyes 3 (three) times daily. 03/20/23  Yes [provider]  cinacalcet  (SENSIPAR ) 30 MG tablet Take 30 mg by mouth daily. 10/05/20  Yes [provider]  dorzolamide -timolol  (COSOPT ) 2-0.5 % ophthalmic solution Place 1 drop into the left eye 2 (two) times daily. 07/08/22 07/08/23 Yes Valdemar Rogue, MD  losartan  (COZAAR ) 25 MG tablet TAKE 1 TABLET BY MOUTH AT BEDTIME 02/22/23  Yes Thukkani, Arun K, MD  metoprolol  succinate (TOPROL -XL) 25 MG 24 hr tablet TAKE 1 TABLET BY MOUTH AT BEDTIME 02/22/23  Yes Thukkani, Arun K, MD  nitroGLYCERIN  (NITROSTAT ) 0.4 MG SL tablet Place 1 tablet (0.4 mg total) under the tongue every 5 (five) minutes x 3 doses as needed for chest pain. 05/19/13  Yes Jerilynn Lamarr HERO, NP  rosuvastatin  (CRESTOR ) 10 MG tablet Take 1 tablet (10 mg total) by mouth daily. 01/17/23  Yes Thukkani, Arun K, MD  sevelamer  carbonate (RENVELA ) 800 MG tablet Take 800 mg by mouth 3 (three) times daily with meals.   Yes [provider]  Evolocumab  (REPATHA  SURECLICK) 140 MG/ML SOAJ Inject 140 mg into the skin every 14 (fourteen) days. Patient not taking: Reported on 06/06/2023 02/17/23   Waddell Danelle ORN, MD     Vitals:   06/06/23 0300 06/06/23 0728 06/06/23 0923 06/06/23 1200  BP: 113/71  112/71 111/69  Pulse: 72  65 64  Resp: 16  18 18   Temp: 98 F (36.7 C) 97.6 F (36.4 C) 97.6 F (36.4 C)   TempSrc: Oral Oral Oral   SpO2: 99%  100% 96%  Weight:      Height:       Exam Gen alert, no distress No rash, cyanosis or gangrene Sclera anicteric, throat clear  No jvd or bruits Chest clear bilat to bases, no rales/ wheezing RRR no MRG Abd soft ntnd no mass or ascites +bs GU nl male MS no joint effusions or deformity Ext 2+ bilat lower leg pitting edema, no other edema Neuro is alert, Ox 3 , nf   TDC pulled out - no access at this time    Renal-related home meds: - renvela   800 ac tid - sensipar  30 every day -  losartan  25 hs - toprol  xl 25 hs - others: statin, repatha , sl ntg, eye drops    OP HD: TTS NW  4h  B400   85kg  2/2 bath  RIJ TDC   Heparin  4000 - last HD 1/30, post wt 84.8kg - rocaltrol  1.5 mcg po three times per week - no esa, last Hb 11.7   Assessment/ Plan: Dislodged HD catheter - placed in jan 2025 by Nephrology outpt service. Seen here by IR, appreciate assistance.  ESRD - on HD TTS. Last HD Thursday last week. Plan HD after [facility] replaced today/ tonight or tomorrow.  HTN - BP's are a bit low. Have added hold thresholds to losartan  and toprol  xl.  Volume - sig LE edema, lungs appear to be clear. Lower vol w/ HD as tol.  Anemia of esrd - Hb12 here. Not on esa. Follow.  Secondary hyperparathyroidism - Ca in range. Add on phos/ alb. Cont binder w/ meals. Cont sensipar  and po vdra.  Afib/ flutter/ HFrEF - EF < 20% in oct 2024.       Myer Fret  MD [facility] 06/06/2023, 1:06 PM  Recent Labs  Lab 06/06/23 0347  HGB 12.2*  CALCIUM  9.1  CREATININE 10.29*  K 4.4   Inpatient medications:  calcitRIOL   1.5 mcg Oral Q T,Th,Sat-1800   cinacalcet   30 mg Oral Q breakfast   dorzolamide -timolol   1 drop Left Eye BID   losartan   25 mg Oral QHS   metoprolol  succinate  25 mg Oral QHS   rosuvastatin   10 mg Oral Daily   sevelamer  carbonate  800 mg Oral TID WC   sodium chloride  flush  3 mL Intravenous Q12H    acetaminophen  **OR** acetaminophen , diphenhydrAMINE -zinc  acetate, prochlorperazine , senna

## 2023-06-06 NOTE — ED Provider Notes (Signed)
 [place] EMERGENCY DEPARTMENT AT [facility] Provider Note   CSN: [id number] Arrival date & time: 06/05/23  1649     History  Chief Complaint  Patient presents with   Displaced Dialysis Catheter    Michael [name]. is a 68 y.o. male.  The history is provided by the patient.  He has history of hypertension, hyperlipidemia, systolic heart failure, end-stage renal disease on hemodialysis, atrial flutter anticoagulated on apixaban  and states that his central line dialysis access catheter came out when he fell 2 days ago.  He is scheduled for dialysis in the morning.  He denies complaints.   Home Medications Prior to Admission medications   Medication Sig Start Date End Date Taking? Authorizing Provider  apixaban  (ELIQUIS ) 5 MG TABS tablet Take 1 tablet by mouth twice daily 06/27/22   Waddell Danelle ORN, MD  cinacalcet  (SENSIPAR ) 30 MG tablet Take 30 mg by mouth daily. 10/05/20   [provider]  dorzolamide -timolol  (COSOPT ) 2-0.5 % ophthalmic solution Place 1 drop into the left eye 2 (two) times daily. 07/08/22 07/08/23  Valdemar Rogue, MD  Evolocumab  (REPATHA  SURECLICK) 140 MG/ML SOAJ Inject 140 mg into the skin every 14 (fourteen) days. 02/17/23   Waddell Danelle ORN, MD  losartan  (COZAAR ) 25 MG tablet TAKE 1 TABLET BY MOUTH AT BEDTIME 02/22/23   Thukkani, Arun K, MD  metoprolol  succinate (TOPROL -XL) 25 MG 24 hr tablet TAKE 1 TABLET BY MOUTH AT BEDTIME 02/22/23   Thukkani, Arun K, MD  nitroGLYCERIN  (NITROSTAT ) 0.4 MG SL tablet Place 1 tablet (0.4 mg total) under the tongue every 5 (five) minutes x 3 doses as needed for chest pain. 05/19/13   Jerilynn Lamarr HERO, NP  rosuvastatin  (CRESTOR ) 10 MG tablet Take 1 tablet (10 mg total) by mouth daily. 01/17/23   Thukkani, Arun K, MD  sevelamer  carbonate (RENVELA ) 800 MG tablet Take 1,600 mg by mouth 3 (three) times daily with meals.    [provider]  spironolactone  (ALDACTONE ) 25 MG tablet Take 25 mg by mouth daily.     [provider]      Allergies    Colchicine, Indapamide, Lipitor [atorvastatin ], Viagra [sildenafil], and Zyloprim [allopurinol]    Review of Systems   Review of Systems  All other systems reviewed and are negative.   Physical Exam Updated Vital Signs BP 102/70   Pulse 61   Temp 98.6 F (37 C) (Oral)   Resp 16   Ht 5' 7 (1.702 m)   Wt 81.6 kg   SpO2 98%   BMI 28.18 kg/m  Physical Exam Vitals and nursing note reviewed.   68 year old male, resting comfortably and in no acute distress. Vital signs are normal. Oxygen saturation is 98%, which is normal. Head is normocephalic and atraumatic. PERRLA, EOMI. Oropharynx is clear. Neck is nontender and supple without adenopathy. Lungs are clear without rales, wheezes, or rhonchi. Chest is nontender.  Insertion site for dialysis access catheter is present without active bleeding. Heart has regular rate and rhythm without murmur. Abdomen is soft, flat, nontender. Extremities have no cyanosis or edema. Skin is warm and dry without rash. Neurologic: Mental status is normal, cranial nerves are intact, moves all extremities equally.  ED Results / Procedures / Treatments   Labs (all labs ordered are listed, but only abnormal results are displayed) Labs Reviewed  CBC WITH DIFFERENTIAL/PLATELET  BASIC METABOLIC PANEL    EKG None  Radiology CT Chest Wo Contrast Result Date: 06/06/2023 CLINICAL DATA:  Possible intracardiac  foreign body. EXAM: CT CHEST WITHOUT CONTRAST TECHNIQUE: Multidetector CT imaging of the chest was performed following the standard protocol without IV contrast. RADIATION DOSE REDUCTION: This exam was performed according to the departmental dose-optimization program which includes automated exposure control, adjustment of the mA and/or kV according to patient size and/or use of iterative reconstruction technique. COMPARISON:  None Available. FINDINGS: Cardiovascular: Massive cardiomegaly with pericardial  effusion measuring up to 2.7 cm in thickness. There are pericardial calcifications adjacent to the right atrium the correspond to the abnormality seen on the chest radiograph. Calcific aortic atherosclerosis. Coronary artery calcifications. Mediastinum/Nodes: No enlarged mediastinal or axillary lymph nodes. Thyroid gland, trachea, and esophagus demonstrate no significant findings. Lungs/Pleura: Small right pleural effusion.  Bibasilar atelectasis. Upper Abdomen: Polycystic disease of the liver and kidneys. Musculoskeletal: No chest wall mass or suspicious bone lesions identified. IMPRESSION: 1. Massive cardiomegaly with pericardial effusion measuring up to 2.7 cm in thickness. 2. Pericardial calcifications adjacent to the right atrium correspond to the abnormality seen on the chest radiograph. 3. Small right pleural effusion. 4. Polycystic disease of the liver and kidneys. 5.  Aortic atherosclerosis (ICD10-I70.0). Electronically Signed   By: Franky Stanford M.D.   On: 06/06/2023 03:42   DG Chest Portable 1 View Result Date: 06/05/2023 CLINICAL DATA:  Broken dialysis catheter. EXAM: PORTABLE CHEST 1 VIEW COMPARISON:  08/11/2021 FINDINGS: Curvilinear density projecting over the inferior right apex measuring 4.5 cm in length would be consistent with a catheter fragment. No indwelling catheters are demonstrated. Prominent enlargement of the cardiac silhouette may represent cardiac enlargement and/or effusion. Probable small bilateral pleural effusions. Lungs are clear. No pneumothorax. Calcification of the aorta. IMPRESSION: 4.5 cm curvilinear density projecting over the right atrium would be consistent with a catheter fragment. Lungs are clear. Electronically Signed   By: Elsie Gravely M.D.   On: 06/05/2023 19:17    Procedures Procedures    Medications Ordered in ED Medications - No data to display  ED Course/ Medical Decision Making/ A&P                                 Medical Decision Making Amount  and/or Complexity of Data Reviewed Labs: ordered. Radiology: ordered.  Risk Decision regarding hospitalization.   Dialysis access catheter medically removed from fall at home.  He brought the catheter in and it does appear to have been torn.  X-ray ordered at triage showed a curvilinear density projecting over the inferior right apex measuring 4.5 cm in length which appears to be the other end of his central line.  I have independently viewed the image, and agree with the radiologist's interpretation.  I am placing a consultatian request with cardiothoracic surgery.  I have discussed case with Dr. Lucas of cardiothoracic surgery service.  He requests CT of chest to be obtained to clarify position of the catheter fragment.  Patient will need to be admitted to hospitalist's service and transferred to [facility] where he we will see the patient in consultation, also recommends consultation with interventional radiology.  CT chest shows cardiomegaly with pericardial effusion and pericardial calcifications that apparently are what were seen on the chest x-ray do not actually represent his catheter.  I have independently viewed the images, and agree with the radiologist's interpretation.  I have also question whether there might be piece of catheter that is visible in the internal jugular vein.  He will still need evaluation by interventional  radiology and will need to have a new dialysis catheter placed.  I have discussed the case with Dr. Charlton of [facility] hospital[facility], who agrees to admit the patient.  He will need to be admitted to [facility], I am sending him to the [facility] emergency department so that interventional radiology can see him when they arrive in the morning.  Final Clinical Impression(s) / ED Diagnoses Final diagnoses:  End-stage renal disease on hemodialysis (HCC)  Pancytopenia (HCC)    Rx / DC Orders ED Discharge Orders     None         Raford Lenis,  MD 06/06/23 0500

## 2023-06-06 NOTE — Procedures (Signed)
 Interventional Radiology Procedure Note  Procedure: Tunneled hemodialysis catheter placement   Findings: Please refer to procedural dictation for full description. 23 cm right internal jugular.  Complications: None immediate  Estimated Blood Loss: < 5 mL  Recommendations: Catheter ready for immediate use.   Creasie Doctor, MD

## 2023-06-06 NOTE — Plan of Care (Signed)
  Problem: Education: Goal: Knowledge of disease and its progression will improve Outcome: Completed/Met

## 2023-06-06 NOTE — Plan of Care (Signed)

## 2023-06-06 NOTE — H&P (Signed)
 Chief Complaint: Patient was seen in consultation today for  Chief Complaint  Patient presents with   Displaced Dialysis Catheter     Referring Physician(s): Dr. Geralynn Dr. Bryn  Supervising Physician: Luverne Aran  Patient Status: [facility] - In-pt  History of Present Illness: Michael [name]. is a 68 y.o. male with ESRD on HD. Has had multiple HD catheters over the years. Most recently placed on 1/6 by Nephrology outpt service. States he fell a few days ago and thinks catheter may have come loose then, but came all the way out yesterday. Has come to [facility] for replacement. Has also missed a couple HD sessions so will need HD after new catheter placed.  IR contacted for HD cath placement. PMHx, meds, labs, imaging, allergies reviewed. Feels well, no recent fevers, chills, illness. Has been NPO today. Family at bedside.   Past Medical History:  Diagnosis Date   Atrial arrhythmia    atrial tachycardia with variable AV conduction versus atypical aflutter 01/10/19, rate control (02/06/19)   Cataract    Dyspnea    on exertion   ESRD (end stage renal disease) (HCC)    Hemo- MWF, Polycystic kidney disease   Fatigue    History of kidney stones    removal of stone- cysto   Hyperlipidemia    Hyperparathyroidism, secondary renal (HCC)    Hypertension    Hypoxemia 12/12/2013   Nonischemic cardiomyopathy (HCC)    Er 25% 2015, 55 % 2013   OSA on CPAP    no longer using cpap   OSA on CPAP 03/24/2014   Pneumonia    2015ish   Prostate cancer (HCC)    Ventricular tachycardia//Freq PVCs    Wears glasses     Past Surgical History:  Procedure Laterality Date   A-FLUTTER ABLATION N/A 04/11/2019   Procedure: A-FLUTTER ABLATION;  Surgeon: Waddell Danelle ORN, MD;  Location: [facility] INVASIVE CV LAB;  Service: Cardiovascular;  Laterality: N/A;   A/V FISTULAGRAM Left 04/27/2017   Procedure: A/V FISTULAGRAM;  Surgeon: Laurence Redell CROME, MD;  Location: [facility] INVASIVE CV LAB;  Service:  Cardiovascular;  Laterality: Left;  lt arm   A/V FISTULAGRAM Left 01/10/2019   Procedure: A/V FISTULAGRAM;  Surgeon: Gretta Lonni PARAS, MD;  Location: [facility] INVASIVE CV LAB;  Service: Cardiovascular;  Laterality: Left;   APPENDECTOMY     AV FISTULA PLACEMENT  12/05/2011   Procedure: ARTERIOVENOUS (AV) FISTULA CREATION;LLEFT ARM  Surgeon: Redell CROME Laurence, MD;  Location: [facility] OR;  Service: Vascular;  Laterality: Left;  RADIO-CEPHALIC  fistula left arm   AV FISTULA PLACEMENT  01/11/2012   Procedure: ARTERIOVENOUS (AV) FISTULA CREATION;  Surgeon: Redell CROME Laurence, MD;  Location: [facility] OR;  Service: Vascular;  Laterality: Left;  Creation of left brachial cephalic arteriovenous fistula   AV FISTULA PLACEMENT Right 03/07/2019   Procedure: ARTERIOVENOUS (AV) FISTULA CREATION  RIGHT ARM;  Surgeon: Gretta Lonni PARAS, MD;  Location: [facility] OR;  Service: Vascular;  Laterality: Right;   BASCILIC VEIN TRANSPOSITION Left 12/27/2016   Procedure: BASILIC VEIN TRANSPOSITION LEFT UPPER ARM FIRST STAGE;  Surgeon: Laurence Redell CROME, MD;  Location: [facility] OR;  Service: Vascular;  Laterality: Left;   BASCILIC VEIN TRANSPOSITION Left 01/31/2017   Procedure: LEFT ARM BASILIC VEIN TRANSPOSITION, SECOND STAGE;  Surgeon: Laurence Redell CROME, MD;  Location: [facility] OR;  Service: Vascular;  Laterality: Left;   CARDIAC CATHETERIZATION  04-05-2010   checking for blockage but none-[facility]   COLONOSCOPY     CYSTOSCOPY W/ STONE  MANIPULATION     laser once (01/22/2013)   DIALYSIS/PERMA CATHETER INSERTION N/A 05/08/2023   Procedure: DIALYSIS/PERMA CATHETER INSERTION;  Surgeon: Norine Manuelita LABOR, MD;  Location: [facility] INVASIVE CV LAB;  Service: Cardiovascular;  Laterality: N/A;   DIALYSIS/PERMA CATHETER REMOVAL N/A 05/08/2023   Procedure: DIALYSIS/PERMA CATHETER REMOVAL;  Surgeon: Norine Manuelita LABOR, MD;  Location: [facility] INVASIVE CV LAB;  Service: Cardiovascular;  Laterality: N/A;   HEMATOMA EVACUATION Left 05/09/2017   Procedure: EVACUATION HEMATOMA LEFT ARM;  Surgeon: Laurence Redell CROME, MD;  Location: [facility] OR;  Service: Vascular;  Laterality: Left;   HERNIA REPAIR     INGUINAL HERNIA REPAIR Right 11/06/2015   Procedure: OPEN HERNIA REPAIR  RIGHT INGUINAL ADULT;  Surgeon: Donnice Lunger, MD;  Location: [facility] ORS;  Service: General;  Laterality: Right;  with MESH   INSERTION OF DIALYSIS CATHETER Right 10/05/2016   Procedure: INSERTION OF right internal jugular DIALYSIS CATHETER;  Surgeon: Oris Krystal FALCON, MD;  Location: [facility] OR;  Service: Vascular;  Laterality: Right;   INSERTION OF MESH  03/20/2012   Procedure: INSERTION OF MESH;  JADE Surgeon: Donnice Bury, MD;  Location: [facility] OR;  Service: General;  Laterality: N/A;   INSERTION OF MESH N/A 01/22/2013   Procedure: INSERTION OF MESH;  Surgeon: Donnice Bury, MD;  Location: [facility] OR;  Service: General;  Laterality: N/A;   LAPAROTOMY  04/02/2012   Procedure: EXPLORATORY LAPAROTOMY;  Surgeon: Donnice Bury, MD;  Location: [facility] OR;  Service: General;  Laterality: N/A;  Exploratory Laparotomy with resection of small intestine   LEFT HEART CATHETERIZATION WITH CORONARY ANGIOGRAM N/A 05/14/2013   Procedure: LEFT HEART CATHETERIZATION WITH CORONARY ANGIOGRAM;  Surgeon: Victory LELON Claudene DOUGLAS, MD;  Location: [facility] CATH LAB;  Service: Cardiovascular;  Laterality: N/A;   LIGATION OF ARTERIOVENOUS  FISTULA Left 12/27/2016   Procedure: LIGATION/EXCISION OF LEFT UPPER ARM ARTERIOVENOUS  FISTULA;  EVACUATION OF HEMATOMA;  Surgeon: Laurence Redell CROME, MD;  Location: [facility] OR;  Service: Vascular;  Laterality: Left;   LIGATION OF ARTERIOVENOUS  FISTULA Left 03/07/2019   Procedure: LIGATION OF ARTERIOVENOUS FISTULA  LEFT UPPER ARM;  Surgeon: Gretta Lonni PARAS, MD;  Location: [facility] OR;  Service: Vascular;  Laterality: Left;   PERIPHERAL VASCULAR BALLOON ANGIOPLASTY  05/08/2023   Procedure: PERIPHERAL VASCULAR BALLOON ANGIOPLASTY;  Surgeon: Norine Manuelita LABOR, MD;  [facility]: [facility] INVASIVE CV LAB;  Service: Cardiovascular;;  innominate/ SVC   REVISON OF ARTERIOVENOUS FISTULA Left  10/05/2016   Procedure: REVISON OF left arm ARTERIOVENOUS FISTULA;  Surgeon: Oris Krystal FALCON, MD;  Location: [facility] OR;  Service: Vascular;  Laterality: Left;   TONSILLECTOMY     UMBILICAL HERNIA REPAIR  03/20/2012   Procedure: HERNIA REPAIR UMBILICAL ADULT;  Surgeon: Donnice Bury, MD;  Location: [facility] OR;  Service: General;  Laterality: N/A;   UMBILICAL HERNIA REPAIR  01/22/2013   preperitoneal open procedure due to significant adhesions/notes 01/22/2013   VENTRAL HERNIA REPAIR N/A 01/22/2013   Procedure: ATTEMPTED LAPAROSCOPIC VENTRAL HERNIA CONVERTED TO OPEN;  Surgeon: Donnice Bury, MD;  Location: [facility] OR;  Service: General;  Laterality: N/A;    Allergies: Colchicine, Indapamide, Lipitor [atorvastatin ], Viagra [sildenafil], and Zyloprim [allopurinol]  Medications: Prior to Admission medications   Medication Sig Start Date End Date Taking? Authorizing Provider  apixaban  (ELIQUIS ) 5 MG TABS tablet Take 1 tablet by mouth twice daily 06/27/22  Yes Waddell Danelle LELON, MD  brimonidine  (ALPHAGAN ) 0.2 % ophthalmic solution Place 1 drop into both eyes 3 (three) times daily. 03/20/23  Yes [provider]  cinacalcet  (SENSIPAR ) 30 MG tablet Take 30 mg by mouth daily. 10/05/20  Yes [provider]  dorzolamide -timolol  (COSOPT ) 2-0.5 % ophthalmic solution Place 1 drop into the left eye 2 (two) times daily. 07/08/22 07/08/23 Yes Valdemar Rogue, MD  losartan  (COZAAR ) 25 MG tablet TAKE 1 TABLET BY MOUTH AT BEDTIME 02/22/23  Yes Thukkani, Arun K, MD  metoprolol  succinate (TOPROL -XL) 25 MG 24 hr tablet TAKE 1 TABLET BY MOUTH AT BEDTIME 02/22/23  Yes Thukkani, Arun K, MD  nitroGLYCERIN  (NITROSTAT ) 0.4 MG SL tablet Place 1 tablet (0.4 mg total) under the tongue every 5 (five) minutes x 3 doses as needed for chest pain. 05/19/13  Yes Jerilynn Lamarr HERO, NP  rosuvastatin  (CRESTOR ) 10 MG tablet Take 1 tablet (10 mg total) by mouth daily. 01/17/23  Yes Thukkani, Arun K, MD  sevelamer  carbonate (RENVELA ) 800 MG  tablet Take 800 mg by mouth 3 (three) times daily with meals.   Yes [provider]  Evolocumab  (REPATHA  SURECLICK) 140 MG/ML SOAJ Inject 140 mg into the skin every 14 (fourteen) days. Patient not taking: Reported on 06/06/2023 02/17/23   Waddell Danelle ORN, MD     Family History  Problem Relation Age of Onset   Heart disease Mother    Hyperlipidemia Mother    Hypertension Mother    Kidney disease Father    Stroke Father    Kidney disease Brother    Amblyopia Neg Hx    Blindness Neg Hx    Cataracts Neg Hx    Diabetes Neg Hx    Glaucoma Neg Hx    Macular degeneration Neg Hx    Retinal detachment Neg Hx    Strabismus Neg Hx    Retinitis pigmentosa Neg Hx    Breast cancer Neg Hx    Prostate cancer Neg Hx    Colon cancer Neg Hx    Pancreatic cancer Neg Hx     Social History   Socioeconomic History   Marital status: Married    Spouse name: [name]   Number of children: 3   Years of education: 12   Highest education level: Not on file  Occupational History    Comment: disabled  Tobacco Use   Smoking status: Never   Smokeless tobacco: Never  Vaping Use   Vaping status: Never Used  Substance and Sexual Activity   Alcohol  use: No    Alcohol /week: 0.0 standard drinks of alcohol    Drug use: No   Sexual activity: Yes  Other Topics Concern   Not on file  Social History Narrative   Patient is married ([name]) and lives at home with his wife and children.   Patient has three children.   Patient is in disability.   Patient has a high school education.   Patient is right-handed   Patient drinks very little soda.            Social Drivers of Corporate Investment Banker Strain: Not on file  Food Insecurity: Not on file  Transportation Needs: Not on file  Physical Activity: Not on file  Stress: Not on file  Social Connections: Not on file    Review of Systems: A 12 point ROS discussed and pertinent positives are indicated in the HPI above.  All other systems are  negative.  Review of Systems  Vital Signs: BP 112/71 (BP Location: Left Arm)   Pulse 65   Temp 97.6 F (36.4 C) (Oral)   Resp 18   Ht 5'  7 (1.702 m)   Wt 179 lb 14.3 oz (81.6 kg)   SpO2 100%   BMI 28.18 kg/m   Physical Exam Constitutional:      Appearance: He is not ill-appearing.  HENT:     Mouth/Throat:     Mouth: Mucous membranes are moist.     Pharynx: Oropharynx is clear.  Cardiovascular:     Rate and Rhythm: Normal rate and regular rhythm.     Heart sounds: Normal heart sounds.  Pulmonary:     Effort: Pulmonary effort is normal. No respiratory distress.     Breath sounds: Normal breath sounds.  Neurological:     General: No focal deficit present.     Mental Status: He is alert and oriented to person, place, and time.  Psychiatric:        Mood and Affect: Mood normal.        Thought Content: Thought content normal.     Imaging: CT Chest Wo Contrast Result Date: 06/06/2023 CLINICAL DATA:  Possible intracardiac foreign body. EXAM: CT CHEST WITHOUT CONTRAST TECHNIQUE: Multidetector CT imaging of the chest was performed following the standard protocol without IV contrast. RADIATION DOSE REDUCTION: This exam was performed according to the departmental dose-optimization program which includes automated exposure control, adjustment of the mA and/or kV according to patient size and/or use of iterative reconstruction technique. COMPARISON:  None Available. FINDINGS: Cardiovascular: Massive cardiomegaly with pericardial effusion measuring up to 2.7 cm in thickness. There are pericardial calcifications adjacent to the right atrium the correspond to the abnormality seen on the chest radiograph. Calcific aortic atherosclerosis. Coronary artery calcifications. Mediastinum/Nodes: No enlarged mediastinal or axillary lymph nodes. Thyroid gland, trachea, and esophagus demonstrate no significant findings. Lungs/Pleura: Small right pleural effusion.  Bibasilar atelectasis. Upper Abdomen:  Polycystic disease of the liver and kidneys. Musculoskeletal: No chest wall mass or suspicious bone lesions identified. IMPRESSION: 1. Massive cardiomegaly with pericardial effusion measuring up to 2.7 cm in thickness. 2. Pericardial calcifications adjacent to the right atrium correspond to the abnormality seen on the chest radiograph. 3. Small right pleural effusion. 4. Polycystic disease of the liver and kidneys. 5.  Aortic atherosclerosis (ICD10-I70.0). Electronically Signed   By: Franky Stanford M.D.   On: 06/06/2023 03:42   DG Chest Portable 1 View Result Date: 06/05/2023 CLINICAL DATA:  Broken dialysis catheter. EXAM: PORTABLE CHEST 1 VIEW COMPARISON:  08/11/2021 FINDINGS: Curvilinear density projecting over the inferior right apex measuring 4.5 cm in length would be consistent with a catheter fragment. No indwelling catheters are demonstrated. Prominent enlargement of the cardiac silhouette may represent cardiac enlargement and/or effusion. Probable small bilateral pleural effusions. Lungs are clear. No pneumothorax. Calcification of the aorta. IMPRESSION: 4.5 cm curvilinear density projecting over the right atrium would be consistent with a catheter fragment. Lungs are clear. Electronically Signed   By: Elsie Gravely M.D.   On: 06/05/2023 19:17   PERIPHERAL VASCULAR CATHETERIZATION Result Date: 05/08/2023   Resume usual medications - no changes to current anticoagulation regimen.    Labs:  CBC: Recent Labs    06/06/23 0347  WBC 3.5*  HGB 12.2*  HCT 41.0  PLT 119*    COAGS: No results for input(s): INR, APTT in the last 8760 hours.  BMP: Recent Labs    06/06/23 0347  NA 140  K 4.4  CL 99  CO2 27  GLUCOSE 72  BUN 45*  CALCIUM  9.1  CREATININE 10.29*  GFRNONAA 5*    LIVER FUNCTION TESTS: Recent Labs  01/09/23 1038  BILITOT 1.0  AST 15  ALT 6  ALKPHOS 35*  PROT 5.2*  ALBUMIN 3.2*     Assessment and Plan: ESRD on HD Displaced tunneled HD cath Needs new  placement. Unfortunately [facility] IR dept does not have tunneled HD caths in stock. Pt to be transferred to [facility] for catheter placement as well as HD after. Risks and benefits of image guided tunneled hemodialysis catheter placement was discussed with the patient including, but not limited to bleeding, infection, pneumothorax, or fibrin sheath development and need for additional procedures.  All of the patient's questions were answered, patient is agreeable to proceed. Consent signed and in chart.    Electronically Signed: Franky Rusk, PA-C 06/06/2023, 10:56 AM   I spent a total of 20 minutes in face to face in clinical consultation, greater than 50% of which was counseling/coordinating care for [facility] placement

## 2023-06-06 NOTE — H&P (Signed)
 History and Physical    Michael [name]. FMW:[id number] DOB: 1955-09-04 DOA: 06/05/2023  PCP: Gib Charleston, MD   Patient coming from: Home   Chief Complaint: Dialysis catheter came out   HPI: Michael [name]. is a 68 y.o. male with medical history significant for nonischemic cardiomyopathy with EF less than 20%, prostate cancer, and ESRD on hemodialysis who presents after his dialysis catheter came out.  Patient reports that he completed HD without incident on 06/05/2023.  His wife at the bedside states that he has not been to dialysis since 06/03/2023.  In either case, the patient reports that he fell out of bed the night of 06/05/2023 and the catheter in his right chest came out.  There was some bleeding initially but that has stopped.  He denies any chest pain, change in his chronic dyspnea, fever, or chills.  ED Course: Upon arrival to the ED, patient is found to be afebrile and saturating well on room air with normal RR and stable blood pressure.  Labs are most notable for BUN 45, normal potassium, normal bicarbonate, WBC 3500, and platelets 119,000.  Chest x-ray was concerning for possible dialysis catheter fragment in the right atrium.  ED physician discussed the situation with cardiothoracic surgery (Dr. Lucas) who recommended chest CT.  CT demonstrates massive cardiomegaly and pericardial effusion, and reveals pericardial calcifications corresponding to the opacity seen on chest x-ray.  Review of Systems:  All other systems reviewed and apart from HPI, are negative.  Past Medical History:  Diagnosis Date   Atrial arrhythmia    atrial tachycardia with variable AV conduction versus atypical aflutter 01/10/19, rate control (02/06/19)   Cataract    Dyspnea    on exertion   ESRD (end stage renal disease) (HCC)    Hemo- MWF, Polycystic kidney disease   Fatigue    History of kidney stones    removal of stone- cysto   Hyperlipidemia    Hyperparathyroidism, secondary renal (HCC)     Hypertension    Hypoxemia 12/12/2013   Nonischemic cardiomyopathy (HCC)    Er 25% 2015, 55 % 2013   OSA on CPAP    no longer using cpap   OSA on CPAP 03/24/2014   Pneumonia    2015ish   Prostate cancer (HCC)    Ventricular tachycardia//Freq PVCs    Wears glasses     Past Surgical History:  Procedure Laterality Date   A-FLUTTER ABLATION N/A 04/11/2019   Procedure: A-FLUTTER ABLATION;  Surgeon: Waddell Danelle ORN, MD;  Location: [facility] INVASIVE CV LAB;  Service: Cardiovascular;  Laterality: N/A;   A/V FISTULAGRAM Left 04/27/2017   Procedure: A/V FISTULAGRAM;  Surgeon: Laurence Redell CROME, MD;  Location: [facility] INVASIVE CV LAB;  Service: Cardiovascular;  Laterality: Left;  lt arm   A/V FISTULAGRAM Left 01/10/2019   Procedure: A/V FISTULAGRAM;  Surgeon: Gretta Lonni PARAS, MD;  Location: [facility] INVASIVE CV LAB;  Service: Cardiovascular;  Laterality: Left;   APPENDECTOMY     AV FISTULA PLACEMENT  12/05/2011   Procedure: ARTERIOVENOUS (AV) FISTULA CREATION;LLEFT ARM  Surgeon: Redell CROME Laurence, MD;  Location: [facility] OR;  Service: Vascular;  Laterality: Left;  RADIO-CEPHALIC  fistula left arm   AV FISTULA PLACEMENT  01/11/2012   Procedure: ARTERIOVENOUS (AV) FISTULA CREATION;  Surgeon: Redell CROME Laurence, MD;  Location: [facility] OR;  Service: Vascular;  Laterality: Left;  Creation of left brachial cephalic arteriovenous fistula   AV FISTULA PLACEMENT Right 03/07/2019   Procedure: ARTERIOVENOUS (AV) FISTULA CREATION  RIGHT ARM;  Surgeon: Gretta Lonni PARAS, MD;  Location: [facility] OR;  Service: Vascular;  Laterality: Right;   BASCILIC VEIN TRANSPOSITION Left 12/27/2016   Procedure: BASILIC VEIN TRANSPOSITION LEFT UPPER ARM FIRST STAGE;  Surgeon: Laurence Redell CROME, MD;  Location: [facility] OR;  Service: Vascular;  Laterality: Left;   BASCILIC VEIN TRANSPOSITION Left 01/31/2017   Procedure: LEFT ARM BASILIC VEIN TRANSPOSITION, SECOND STAGE;  Surgeon: Laurence Redell CROME, MD;  Location: [facility] OR;  Service: Vascular;  Laterality: Left;   CARDIAC CATHETERIZATION   04-05-2010   checking for blockage but none-[facility]   COLONOSCOPY     CYSTOSCOPY W/ STONE MANIPULATION     laser once (01/22/2013)   DIALYSIS/PERMA CATHETER INSERTION N/A 05/08/2023   Procedure: DIALYSIS/PERMA CATHETER INSERTION;  Surgeon: Norine Manuelita LABOR, MD;  Location: [facility] INVASIVE CV LAB;  Service: Cardiovascular;  Laterality: N/A;   DIALYSIS/PERMA CATHETER REMOVAL N/A 05/08/2023   Procedure: DIALYSIS/PERMA CATHETER REMOVAL;  Surgeon: Norine Manuelita LABOR, MD;  Location: [facility] INVASIVE CV LAB;  Service: Cardiovascular;  Laterality: N/A;   HEMATOMA EVACUATION Left 05/09/2017   Procedure: EVACUATION HEMATOMA LEFT ARM;  Surgeon: Laurence Redell CROME, MD;  Location: [facility] OR;  Service: Vascular;  Laterality: Left;   HERNIA REPAIR     INGUINAL HERNIA REPAIR Right 11/06/2015   Procedure: OPEN HERNIA REPAIR  RIGHT INGUINAL ADULT;  Surgeon: Donnice Lunger, MD;  Location: [facility] ORS;  Service: General;  Laterality: Right;  with MESH   INSERTION OF DIALYSIS CATHETER Right 10/05/2016   Procedure: INSERTION OF right internal jugular DIALYSIS CATHETER;  Surgeon: Oris Krystal FALCON, MD;  Location: [facility] OR;  Service: Vascular;  Laterality: Right;   INSERTION OF MESH  03/20/2012   Procedure: INSERTION OF MESH;  JADE Surgeon: Donnice Bury, MD;  Location: [facility] OR;  Service: General;  Laterality: N/A;   INSERTION OF MESH N/A 01/22/2013   Procedure: INSERTION OF MESH;  Surgeon: Donnice Bury, MD;  Location: [facility] OR;  Service: General;  Laterality: N/A;   LAPAROTOMY  04/02/2012   Procedure: EXPLORATORY LAPAROTOMY;  Surgeon: Donnice Bury, MD;  Location: [facility] OR;  Service: General;  Laterality: N/A;  Exploratory Laparotomy with resection of small intestine   LEFT HEART CATHETERIZATION WITH CORONARY ANGIOGRAM N/A 05/14/2013   Procedure: LEFT HEART CATHETERIZATION WITH CORONARY ANGIOGRAM;  Surgeon: Victory LELON Claudene DOUGLAS, MD;  Location: [facility] CATH LAB;  Service: Cardiovascular;  Laterality: N/A;   LIGATION OF ARTERIOVENOUS  FISTULA Left 12/27/2016    Procedure: LIGATION/EXCISION OF LEFT UPPER ARM ARTERIOVENOUS  FISTULA;  EVACUATION OF HEMATOMA;  Surgeon: Laurence Redell CROME, MD;  Location: [facility] OR;  Service: Vascular;  Laterality: Left;   LIGATION OF ARTERIOVENOUS  FISTULA Left 03/07/2019   Procedure: LIGATION OF ARTERIOVENOUS FISTULA  LEFT UPPER ARM;  Surgeon: Gretta Lonni PARAS, MD;  Location: [facility] OR;  Service: Vascular;  Laterality: Left;   PERIPHERAL VASCULAR BALLOON ANGIOPLASTY  05/08/2023   Procedure: PERIPHERAL VASCULAR BALLOON ANGIOPLASTY;  Surgeon: Norine Manuelita LABOR, MD;  Location: [facility] INVASIVE CV LAB;  Service: Cardiovascular;;  innominate/ SVC   REVISON OF ARTERIOVENOUS FISTULA Left 10/05/2016   Procedure: REVISON OF left arm ARTERIOVENOUS FISTULA;  Surgeon: Oris Krystal FALCON, MD;  Location: [facility] OR;  Service: Vascular;  Laterality: Left;   TONSILLECTOMY     UMBILICAL HERNIA REPAIR  03/20/2012   Procedure: HERNIA REPAIR UMBILICAL ADULT;  Surgeon: Donnice Bury, MD;  Location: [facility] OR;  Service: General;  Laterality: N/A;   UMBILICAL HERNIA REPAIR  01/22/2013   preperitoneal open procedure due to significant adhesions/notes  01/22/2013   VENTRAL HERNIA REPAIR N/A 01/22/2013   Procedure: ATTEMPTED LAPAROSCOPIC VENTRAL HERNIA CONVERTED TO OPEN;  Surgeon: Donnice Bury, MD;  Location: [facility] OR;  Service: General;  Laterality: N/A;    Social History:   reports that he has never smoked. He has never used smokeless tobacco. He reports that he does not drink alcohol  and does not use drugs.  Allergies  Allergen Reactions   Colchicine Other (See Comments)    Other reaction(s): foot pain   Indapamide Other (See Comments)    unknown   Lipitor [Atorvastatin ] Itching    Leg pain   Viagra [Sildenafil] Other (See Comments)    Feels weird - Levitra. - headache   Zyloprim [Allopurinol] Other (See Comments)    foot pain    Family History  Problem Relation Age of Onset   Heart disease Mother    Hyperlipidemia Mother    Hypertension Mother    Kidney  disease Father    Stroke Father    Kidney disease Brother    Amblyopia Neg Hx    Blindness Neg Hx    Cataracts Neg Hx    Diabetes Neg Hx    Glaucoma Neg Hx    Macular degeneration Neg Hx    Retinal detachment Neg Hx    Strabismus Neg Hx    Retinitis pigmentosa Neg Hx    Breast cancer Neg Hx    Prostate cancer Neg Hx    Colon cancer Neg Hx    Pancreatic cancer Neg Hx      Prior to Admission medications   Medication Sig Start Date End Date Taking? Authorizing Provider  apixaban  (ELIQUIS ) 5 MG TABS tablet Take 1 tablet by mouth twice daily 06/27/22   Waddell Danelle ORN, MD  cinacalcet  (SENSIPAR ) 30 MG tablet Take 30 mg by mouth daily. 10/05/20   [provider]  dorzolamide -timolol  (COSOPT ) 2-0.5 % ophthalmic solution Place 1 drop into the left eye 2 (two) times daily. 07/08/22 07/08/23  Valdemar Rogue, MD  Evolocumab  (REPATHA  SURECLICK) 140 MG/ML SOAJ Inject 140 mg into the skin every 14 (fourteen) days. 02/17/23   Waddell Danelle ORN, MD  losartan  (COZAAR ) 25 MG tablet TAKE 1 TABLET BY MOUTH AT BEDTIME 02/22/23   Thukkani, Arun K, MD  metoprolol  succinate (TOPROL -XL) 25 MG 24 hr tablet TAKE 1 TABLET BY MOUTH AT BEDTIME 02/22/23   Thukkani, Arun K, MD  nitroGLYCERIN  (NITROSTAT ) 0.4 MG SL tablet Place 1 tablet (0.4 mg total) under the tongue every 5 (five) minutes x 3 doses as needed for chest pain. 05/19/13   Jerilynn Lamarr HERO, NP  rosuvastatin  (CRESTOR ) 10 MG tablet Take 1 tablet (10 mg total) by mouth daily. 01/17/23   Thukkani, Arun K, MD  sevelamer  carbonate (RENVELA ) 800 MG tablet Take 1,600 mg by mouth 3 (three) times daily with meals.    [provider]  spironolactone  (ALDACTONE ) 25 MG tablet Take 25 mg by mouth daily.    [provider]    Physical Exam: Vitals:   06/05/23 1737 06/05/23 1937 06/05/23 2330 06/06/23 0300  BP: 95/64 101/65 102/70 113/71  Pulse: (!) 56 (!) 53 61 72  Resp: 16 18 16 16   Temp: 98 F (36.7 C) 98.4 F (36.9 C) 98.6 F (37 C) 98 F  (36.7 C)  TempSrc:  Oral Oral Oral  SpO2: 99% 98% 98% 99%  Weight:      Height:        Constitutional: NAD, no pallor or diaphoresis   Eyes:  PERTLA, lids and conjunctivae normal ENMT: Mucous membranes are moist. Posterior pharynx clear of any exudate or lesions.   Neck: supple, no masses  Respiratory: no wheezing, no crackles. No accessory muscle use.  Cardiovascular: S1 & S2 heard, regular rate and rhythm. Pretibial pitting edema bilaterally.     Abdomen: No distension, no tenderness, soft. Bowel sounds active.  Musculoskeletal: no clubbing / cyanosis. No joint deformity upper and lower extremities.   Skin: no significant rashes, lesions, ulcers. Warm, dry, well-perfused. Neurologic: CN 2-12 grossly intact. Moving all extremities. Alert and oriented.  Psychiatric: Calm. Cooperative.    Labs and Imaging on Admission: I have personally reviewed following labs and imaging studies  CBC: Recent Labs  Lab 06/06/23 0347  WBC 3.5*  NEUTROABS 2.3  HGB 12.2*  HCT 41.0  MCV 86.3  PLT 119*   Basic Metabolic Panel: Recent Labs  Lab 06/06/23 0347  NA 140  K 4.4  CL 99  CO2 27  GLUCOSE 72  BUN 45*  CREATININE 10.29*  CALCIUM  9.1   GFR: Estimated Creatinine Clearance: 7.3 mL/min (A) (by C-G formula based on SCr of 10.29 mg/dL (H)). Liver Function Tests: No results for input(s): AST, ALT, ALKPHOS, BILITOT, PROT, ALBUMIN in the last 168 hours. No results for input(s): LIPASE, AMYLASE in the last 168 hours. No results for input(s): AMMONIA in the last 168 hours. Coagulation Profile: No results for input(s): INR, PROTIME in the last 168 hours. Cardiac Enzymes: No results for input(s): CKTOTAL, CKMB, CKMBINDEX, TROPONINI in the last 168 hours. BNP (last 3 results) No results for input(s): PROBNP in the last 8760 hours. HbA1C: No results for input(s): HGBA1C in the last 72 hours. CBG: No results for input(s): GLUCAP in the last 168  hours. Lipid Profile: No results for input(s): CHOL, HDL, LDLCALC, TRIG, CHOLHDL, LDLDIRECT in the last 72 hours. Thyroid Function Tests: No results for input(s): TSH, T4TOTAL, FREET4, T3FREE, THYROIDAB in the last 72 hours. Anemia Panel: No results for input(s): VITAMINB12, FOLATE, FERRITIN, TIBC, IRON, RETICCTPCT in the last 72 hours. Urine analysis:    Component Value Date/Time   COLORURINE YELLOW 04/01/2012 1311   APPEARANCEUR CLOUDY (A) 04/01/2012 1311   LABSPEC 1.016 04/01/2012 1311   PHURINE 5.0 04/01/2012 1311   GLUCOSEU NEGATIVE 04/01/2012 1311   HGBUR NEGATIVE 04/01/2012 1311   BILIRUBINUR SMALL (A) 04/01/2012 1311   KETONESUR 15 (A) 04/01/2012 1311   PROTEINUR 100 (A) 04/01/2012 1311   UROBILINOGEN 0.2 04/01/2012 1311   NITRITE NEGATIVE 04/01/2012 1311   LEUKOCYTESUR TRACE (A) 04/01/2012 1311   Sepsis Labs: @LABRCNTIP (procalcitonin:4,lacticidven:4) )No results found for this or any previous visit (from the past 240 hours).   Radiological Exams on Admission: CT Chest Wo Contrast Result Date: 06/06/2023 CLINICAL DATA:  Possible intracardiac foreign body. EXAM: CT CHEST WITHOUT CONTRAST TECHNIQUE: Multidetector CT imaging of the chest was performed following the standard protocol without IV contrast. RADIATION DOSE REDUCTION: This exam was performed according to the departmental dose-optimization program which includes automated exposure control, adjustment of the mA and/or kV according to patient size and/or use of iterative reconstruction technique. COMPARISON:  None Available. FINDINGS: Cardiovascular: Massive cardiomegaly with pericardial effusion measuring up to 2.7 cm in thickness. There are pericardial calcifications adjacent to the right atrium the correspond to the abnormality seen on the chest radiograph. Calcific aortic atherosclerosis. Coronary artery calcifications. Mediastinum/Nodes: No enlarged mediastinal or axillary lymph nodes.  Thyroid gland, trachea, and esophagus demonstrate no significant findings. Lungs/Pleura: Small right pleural effusion.  Bibasilar atelectasis.  Upper Abdomen: Polycystic disease of the liver and kidneys. Musculoskeletal: No chest wall mass or suspicious bone lesions identified. IMPRESSION: 1. Massive cardiomegaly with pericardial effusion measuring up to 2.7 cm in thickness. 2. Pericardial calcifications adjacent to the right atrium correspond to the abnormality seen on the chest radiograph. 3. Small right pleural effusion. 4. Polycystic disease of the liver and kidneys. 5.  Aortic atherosclerosis (ICD10-I70.0). Electronically Signed   By: Franky Stanford M.D.   On: 06/06/2023 03:42   DG Chest Portable 1 View Result Date: 06/05/2023 CLINICAL DATA:  Broken dialysis catheter. EXAM: PORTABLE CHEST 1 VIEW COMPARISON:  08/11/2021 FINDINGS: Curvilinear density projecting over the inferior right apex measuring 4.5 cm in length would be consistent with a catheter fragment. No indwelling catheters are demonstrated. Prominent enlargement of the cardiac silhouette may represent cardiac enlargement and/or effusion. Probable small bilateral pleural effusions. Lungs are clear. No pneumothorax. Calcification of the aorta. IMPRESSION: 4.5 cm curvilinear density projecting over the right atrium would be consistent with a catheter fragment. Lungs are clear. Electronically Signed   By: Elsie Gravely M.D.   On: 06/05/2023 19:17    Assessment/Plan   1. Dialysis access problem; ESRD  - IR consulted for placement of dialysis catheter and will then need nephrology consultation for inpatient dialysis  - SLIV, restrict fluids, keep NPO and hold Eliquis  for now    2. Atrial fibrillation/flutter  - Hold Eliquis , continue metoprolol     3. Chronic HFrEF  - EF <20% on TTE from October 2024  - Appears hypervolemic but currently speaking full sentences on room air  - SLIV, continue beta-blocker and ARB, volume to be managed with  dialysis   4. Thrombocytopenia  - Appears stable, no bleeding   5. Pericardial effusion  - Noted on CT in ED; moderate pericardial effusion also seen on TTE from October 2024  - No tamponade clinically    DVT prophylaxis: SCDs  Code Status: Full  Level of Care: Level of care: Telemetry Medical Family Communication: Wife at bedside  Disposition Plan:  Patient is from: home  Anticipated d/c is to: Home  Anticipated d/c date is: 06/08/23  Patient currently: Pending IR eval for dialysis catheter; will likely need HD prior to discharge  Consults called: IR consulted via Epic order  Admission status: Inpatient     Evalene GORMAN Sprinkles, MD [facility]  06/06/2023, 4:41 AM

## 2023-06-06 NOTE — Progress Notes (Signed)
 [facility] PROGRESS NOTE  Michael [name]. (DOB: August 15, 1955) FMW:[id number] PCP: Gib Charleston, MD  Brief Narrative: Michael [name]. is a 68 y.o. male with a history of NICM, LVEF < 20%, ESRD on HD, prostate CA who presented to the ED on 06/05/2023 after dialysis catheter was dislodged when falling out of bed. He was admitted this morning to [facility] with IR consult to replace dialysis catheter and reinitiate dialysis.   Subjective: His hands and legs are swelling up, he's hungry. Not short of breath.   Objective: BP 113/71   Pulse 72   Temp 97.6 F (36.4 C) (Oral)   Resp 16   Ht 5' 7 (1.702 m)   Wt 81.6 kg   SpO2 99%   BMI 28.18 kg/m   Gen: Nontoxic, standing at bedside Pulm: Clear, nonlabored, SpO2 99% on room air  CV: RRR, no MRG. No JVD when upright. Pitting edema involving all extremities. GI: Soft, NT, ND, +BS Neuro: Alert and oriented. No new focal deficits.   Assessment & Plan: ESRD:  - D/w IR, Michael Rusk, PA, here at [facility] who states there are no tunneled HD catheters available here. The patient will be admitted to [facility] regardless to reinitiate dialysis, but will have to have the catheter placed there. The IR consult will carry over and they will be notified when patient arrives. Will keep him NPO and continue holding eliquis  (for atrial fibrillation/flutter) pending procedure.  - Discussed with Dr. Geralynn for nephrology consultation. He is hypervolemic but without pulmonary edema. No encephalopathy. Has evidence of pericardial effusion though no rub and this was known from Oct 2024. No tachycardia/hypotension. Metabolic profile looks ok. He makes very little urine. Patient will, of course, require dialysis, but no emergent needs at this time.   Otherwise, plan will continue per H&P this morning by Dr. Charlton.  [name], MD [facility] [email or web address] 06/06/2023, 8:32 AM

## 2023-06-06 NOTE — Progress Notes (Signed)
 New Admission Note:   Arrival Method: stretcher Mental Orientation: aa+ox4 Telemetry: box 3 Assessment: Completed Skin: C/D/I IV: SL Pain: denies Tubes: n/a Safety Measures: Safety Fall Prevention Plan has been given, discussed and signed Admission: Completed [facility] Orientation: Patient has been orientated to the room, unit and staff.  Family: not present  Orders have been reviewed and implemented. Will continue to monitor the patient. Call light has been placed within reach and bed alarm has been activated.   Doyal Sias, RN

## 2023-06-06 NOTE — Progress Notes (Signed)
   06/06/23 2307  Vitals  Pulse Rate (!) 56  Resp 16  BP 100/66  SpO2 100 %  O2 Device Room Air  Post Treatment  Dialyzer Clearance Lightly streaked  Hemodialysis Intake (mL) 0 mL  Liters Processed 83.5  Fluid Removed (mL) 3500 mL  Tolerated HD Treatment Yes   Received patient in bed to unit.  Alert and oriented.  Informed consent signed and in chart.   TX duration:3.5hrs  Patient tolerated well.  Transported back to the room  Alert, without acute distress.  Hand-off given to patient's nurse.   Access used: [facility]  Access issues: none  Total UF removed: 3.5L Medication(s) given: none    Na'Shaminy T [facility]

## 2023-06-07 DIAGNOSIS — N2581 Secondary hyperparathyroidism of renal origin: Secondary | ICD-10-CM | POA: Diagnosis not present

## 2023-06-07 DIAGNOSIS — Z992 Dependence on renal dialysis: Secondary | ICD-10-CM | POA: Diagnosis not present

## 2023-06-07 DIAGNOSIS — I132 Hypertensive heart and chronic kidney disease with heart failure and with stage 5 chronic kidney disease, or end stage renal disease: Secondary | ICD-10-CM | POA: Diagnosis not present

## 2023-06-07 DIAGNOSIS — E8779 Other fluid overload: Secondary | ICD-10-CM | POA: Diagnosis not present

## 2023-06-07 DIAGNOSIS — T82898A Other specified complication of vascular prosthetic devices, implants and grafts, initial encounter: Secondary | ICD-10-CM | POA: Diagnosis not present

## 2023-06-07 DIAGNOSIS — I5022 Chronic systolic (congestive) heart failure: Secondary | ICD-10-CM | POA: Diagnosis not present

## 2023-06-07 DIAGNOSIS — T82898D Other specified complication of vascular prosthetic devices, implants and grafts, subsequent encounter: Secondary | ICD-10-CM

## 2023-06-07 DIAGNOSIS — D631 Anemia in chronic kidney disease: Secondary | ICD-10-CM | POA: Diagnosis not present

## 2023-06-07 DIAGNOSIS — T8242XA Displacement of vascular dialysis catheter, initial encounter: Secondary | ICD-10-CM | POA: Diagnosis not present

## 2023-06-07 DIAGNOSIS — N186 End stage renal disease: Secondary | ICD-10-CM | POA: Diagnosis not present

## 2023-06-07 DIAGNOSIS — I5020 Unspecified systolic (congestive) heart failure: Secondary | ICD-10-CM | POA: Diagnosis not present

## 2023-06-07 LAB — HEPATITIS B SURFACE ANTIBODY, QUANTITATIVE: Hep B S AB Quant (Post): 7.7 m[IU]/mL — ABNORMAL LOW

## 2023-06-07 MED ORDER — CALCITRIOL 0.5 MCG PO CAPS: 1.5000 ug | ORAL_CAPSULE | ORAL | Status: DC

## 2023-06-07 MED ORDER — APIXABAN 5 MG PO TABS
5.0000 mg | ORAL_TABLET | Freq: Two times a day (BID) | ORAL | Status: DC
  Administered 2023-06-07: 5 mg via ORAL
  Filled 2023-06-07: qty 1

## 2023-06-07 MED ORDER — CALCITRIOL 0.5 MCG PO CAPS: 1.5000 ug | ORAL_CAPSULE | ORAL | 2 refills | Status: DC

## 2023-06-07 MED ORDER — ORAL CARE MOUTH RINSE: 15.0000 mL | OROMUCOSAL | Status: DC | PRN

## 2023-06-07 NOTE — TOC Transition Note (Signed)
 Transition of Care [facility]) - Discharge Note   Patient Details  Name: Michael [name]. [name]: [id number] Date of Birth: 06-01-1955  [facility] CM/SW Contact:  Tom-Hara, Harvest Muskrat, RN Phone Number: 06/07/2023, 11:14 AM   Clinical Narrative:     Patient is scheduled for discharge today.  Readmission Risk Assessment done. Hospital f/u and discharge instructions on AVS. No TOC needs or recommendations noted. Wife, [name] to transport at discharge.  No further TOC needs noted.        Final next level of care: Home/Self Care Barriers to Discharge: Barriers Resolved   Patient Goals and CMS Choice Patient states their goals for this hospitalization and ongoing recovery are:: To return home CMS Medicare.gov Compare Post Acute Care list provided to:: Patient Choice offered to / list presented to : NA      Discharge Placement                Patient to be transferred to facility by: Wife Name of family member notified: [name]    Discharge Plan and Services Additional resources added to the After Visit Summary for                  DME Arranged: N/A DME Agency: NA       HH Arranged: NA HH Agency: NA        Social Drivers of Health (SDOH) Interventions SDOH Screenings   Food Insecurity: No Food Insecurity (06/06/2023)  Housing: Low Risk  (06/06/2023)  Transportation Needs: No Transportation Needs (06/06/2023)  Utilities: Not At Risk (06/06/2023)  Social Connections: Socially Integrated (06/06/2023)  Tobacco Use: Low Risk  (06/06/2023)     Readmission Risk Interventions    06/07/2023   11:05 AM 08/16/2021   11:14 AM  Readmission Risk Prevention Plan  Transportation Screening Complete Complete  PCP or Specialist Appt within 3-5 Days Complete Complete  HRI or Home Care Consult Complete Complete  Social Work Consult for Recovery Care Planning/Counseling Complete Complete  Palliative Care Screening Not Applicable Not Applicable  Medication Review Special Educational Needs Teacher) Referral to Pharmacy Complete

## 2023-06-07 NOTE — Discharge Summary (Signed)
 Physician Discharge Summary   Patient: Michael [name]. [name]: [id number] DOB: Sep 17, 1955  Admit date:     06/05/2023  Discharge date: 06/07/23  Discharge Physician: Michael [name]   PCP: Michael Charleston, MD   Recommendations at discharge:  Please follow up with nephrology as recommended.   Discharge Diagnoses: Principal Problem:   Problem with dialysis access Michael [name]) Active Problems:   Unspecified atrial flutter (HCC)   Thrombocytopenia (HCC)   End stage renal disease (HCC)   Chronic HFrEF (heart failure with reduced ejection fraction) (HCC)   Pericardial effusion  Hospital Course: Michael [name]. is a 68 y.o. male with a history of NICM, LVEF < 20%, ESRD on HD, prostate CA who presented to the ED on 06/05/2023 after dialysis catheter was dislodged when falling out of bed. He was admitted this morning to [name] with IR consult to replace dialysis catheter and reinitiate dialysis.  Discussed with IR, he was transferred to [facility] and he underwent  new [name] HD catheter placement. Nephrology consulted and he underwent HD without any issues.  He was discharged home and recommended to follow upwith PCP and nephrology outpatient.         Consultants: IR, nephrology  Procedures performed: [facility] catheter placement.   Disposition: Home Diet recommendation:  Discharge Diet Orders (From admission, onward)     Start     Ordered   06/07/23 0000  Diet - low sodium heart healthy        06/07/23 0939           Renal diet DISCHARGE MEDICATION: Allergies as of 06/07/2023       Reactions   Colchicine Other (See Comments)   Other reaction(s): foot pain   Indapamide Other (See Comments)   unknown   Lipitor [atorvastatin ] Itching   Leg pain   Viagra [sildenafil] Other (See Comments)   Feels weird - Levitra. - headache   Zyloprim [allopurinol] Other (See Comments)   foot pain        Medication List     STOP taking these medications    losartan  25 MG tablet Commonly known as: COZAAR     Repatha  SureClick 140 MG/ML Soaj Generic drug: Evolocumab        TAKE these medications    brimonidine  0.2 % ophthalmic solution Commonly known as: ALPHAGAN  Place 1 drop into both eyes 3 (three) times daily.   calcitRIOL  0.5 MCG capsule Commonly known as: ROCALTROL  Take 3 capsules (1.5 mcg total) by mouth every Tuesday, Thursday, and Saturday at 6 PM. Start taking on: June 08, 2023   cinacalcet  30 MG tablet Commonly known as: SENSIPAR  Take 30 mg by mouth daily.   dorzolamide -timolol  2-0.5 % ophthalmic solution Commonly known as: COSOPT  Place 1 drop into the left eye 2 (two) times daily.   Eliquis  5 MG Tabs tablet Generic drug: apixaban  Take 1 tablet by mouth twice daily   metoprolol  succinate 25 MG 24 hr tablet Commonly known as: TOPROL -XL TAKE 1 TABLET BY MOUTH AT BEDTIME   nitroGLYCERIN  0.4 MG SL tablet Commonly known as: NITROSTAT  Place 1 tablet (0.4 mg total) under the tongue every 5 (five) minutes x 3 doses as needed for chest pain.   rosuvastatin  10 MG tablet Commonly known as: CRESTOR  Take 1 tablet (10 mg total) by mouth daily.   sevelamer  carbonate 800 MG tablet Commonly known as: RENVELA  Take 800 mg by mouth 3 (three) times daily with meals.        Follow-up Information     Michael [name],  Lamar, MD. Schedule an appointment as soon as possible for a visit in 1 week(s).   Specialty: Family Medicine Contact information: [phone] [place] [address] [phone]                Discharge Exam: Michael [name]   06/05/23 1736 06/06/23 1408  Weight: 81.6 kg 86.9 kg   General exam: Appears calm and comfortable  Respiratory system: Clear to auscultation. Respiratory effort normal. Cardiovascular system: S1 & S2 heard, RRR. Gastrointestinal system: Abdomen is nondistended, soft and nontender.  Central nervous system: Alert and oriented.  Extremities: Symmetric 5 x 5 power. Skin: No rashes, Psychiatry: Mood & affect  appropriate.    Condition at discharge: fair  The results of significant diagnostics from this hospitalization (including imaging, microbiology, ancillary and laboratory) are listed below for reference.   Imaging Studies: IR Fluoro Guide CV Line Right Result Date: 06/06/2023 INDICATION: 68 year old male with history of end-stage renal disease presenting after accidental tunneled hemodialysis catheter removal. EXAM: TUNNELED CENTRAL VENOUS HEMODIALYSIS CATHETER PLACEMENT WITH ULTRASOUND AND FLUOROSCOPIC GUIDANCE MEDICATIONS: Ancef  2 gm IV . The antibiotic was given in an appropriate time interval prior to skin puncture. ANESTHESIA/SEDATION: Moderate (conscious) sedation was employed during this procedure. A total of Versed  1 mg and Fentanyl  25 mcg was administered intravenously. Moderate Sedation Time: 10 minutes. The patient's level of consciousness and vital signs were monitored continuously by radiology nursing throughout the procedure under my direct supervision. FLUOROSCOPY TIME:  Six mGy a still a benign.  There are COMPLICATIONS: None immediate. PROCEDURE: Informed written consent was obtained from the patient after a discussion of the risks, benefits, and alternatives to treatment. Questions regarding the procedure were encouraged and answered. The right neck and chest were prepped with chlorhexidine  in a sterile fashion, and a sterile drape was applied covering the operative field. Maximum barrier sterile technique with sterile gowns and gloves were used for the procedure. A timeout was performed prior to the initiation of the procedure. After creating a small venotomy incision, a 21 gauge micropuncture kit was utilized to access the internal jugular vein. Real-time ultrasound guidance was utilized for vascular access including the acquisition of a permanent ultrasound image documenting patency of the accessed vessel. A Rosen wire was advanced to the level of the IVC and the micropuncture sheath was  exchanged for an 8 Fr dilator. A 14.5 French tunneled hemodialysis catheter measuring 23 cm from tip to cuff was tunneled in a retrograde fashion from the anterior chest wall to the venotomy incision. Serial dilation was then performed an a peel-away sheath was placed. The catheter was then placed through the peel-away sheath with the catheter tip ultimately positioned within the right atrium. Final catheter positioning was confirmed and documented with a spot radiographic image. The catheter aspirates and flushes normally. The catheter was flushed with appropriate volume heparin  dwells. The catheter exit site was secured with a 0-Silk retention suture. The venotomy incision was closed with Dermabond. Sterile dressings were applied. The patient tolerated the procedure well without immediate post procedural complication. IMPRESSION: Successful placement of 23 cm tip to cuff tunneled hemodialysis catheter via the right internal jugular vein with catheter tip terminating within the right atrium. The catheter is ready for immediate use. Ester Sides, MD [name] Electronically Signed   By: Ester Sides M.D.   On: 06/06/2023 16:02   IR US  Guide Vasc Access Right Result Date: 06/06/2023 INDICATION: 68 year old male with history of  end-stage renal disease presenting after accidental tunneled hemodialysis catheter removal. EXAM: TUNNELED CENTRAL VENOUS HEMODIALYSIS CATHETER PLACEMENT WITH ULTRASOUND AND FLUOROSCOPIC GUIDANCE MEDICATIONS: Ancef  2 gm IV . The antibiotic was given in an appropriate time interval prior to skin puncture. ANESTHESIA/SEDATION: Moderate (conscious) sedation was employed during this procedure. A total of Versed  1 mg and Fentanyl  25 mcg was administered intravenously. Moderate Sedation Time: 10 minutes. The patient's level of consciousness and vital signs were monitored continuously by radiology nursing throughout the procedure under my  direct supervision. FLUOROSCOPY TIME:  Six mGy a still a benign.  There are COMPLICATIONS: None immediate. PROCEDURE: Informed written consent was obtained from the patient after a discussion of the risks, benefits, and alternatives to treatment. Questions regarding the procedure were encouraged and answered. The right neck and chest were prepped with chlorhexidine  in a sterile fashion, and a sterile drape was applied covering the operative field. Maximum barrier sterile technique with sterile gowns and gloves were used for the procedure. A timeout was performed prior to the initiation of the procedure. After creating a small venotomy incision, a 21 gauge micropuncture kit was utilized to access the internal jugular vein. Real-time ultrasound guidance was utilized for vascular access including the acquisition of a permanent ultrasound image documenting patency of the accessed vessel. A Rosen wire was advanced to the level of the IVC and the micropuncture sheath was exchanged for an 8 Fr dilator. A 14.5 French tunneled hemodialysis catheter measuring 23 cm from tip to cuff was tunneled in a retrograde fashion from the anterior chest wall to the venotomy incision. Serial dilation was then performed an a peel-away sheath was placed. The catheter was then placed through the peel-away sheath with the catheter tip ultimately positioned within the right atrium. Final catheter positioning was confirmed and documented with a spot radiographic image. The catheter aspirates and flushes normally. The catheter was flushed with appropriate volume heparin  dwells. The catheter exit site was secured with a 0-Silk retention suture. The venotomy incision was closed with Dermabond. Sterile dressings were applied. The patient tolerated the procedure well without immediate post procedural complication. IMPRESSION: Successful placement of 23 cm tip to cuff tunneled hemodialysis catheter via the right internal jugular vein with catheter  tip terminating within the right atrium. The catheter is ready for immediate use. Ester Sides, MD [facility] Electronically Signed   By: Ester Sides M.D.   On: 06/06/2023 16:02   CT Chest Wo Contrast Result Date: 06/06/2023 CLINICAL DATA:  Possible intracardiac foreign body. EXAM: CT CHEST WITHOUT CONTRAST TECHNIQUE: Multidetector CT imaging of the chest was performed following the standard protocol without IV contrast. RADIATION DOSE REDUCTION: This exam was performed according to the departmental dose-optimization program which includes automated exposure control, adjustment of the mA and/or kV according to patient size and/or use of iterative reconstruction technique. COMPARISON:  None Available. FINDINGS: Cardiovascular: Massive cardiomegaly with pericardial effusion measuring up to 2.7 cm in thickness. There are pericardial calcifications adjacent to the right atrium the correspond to the abnormality seen on the chest radiograph. Calcific aortic atherosclerosis. Coronary artery calcifications. Mediastinum/Nodes: No enlarged mediastinal or axillary lymph nodes. Thyroid gland, trachea, and esophagus demonstrate no significant findings. Lungs/Pleura: Small right pleural effusion.  Bibasilar atelectasis. Upper Abdomen: Polycystic disease of the liver and kidneys. Musculoskeletal: No chest wall mass or suspicious bone lesions identified. IMPRESSION: 1. Massive cardiomegaly with pericardial effusion measuring up to 2.7 cm in thickness. 2. Pericardial calcifications adjacent to the right atrium  correspond to the abnormality seen on the chest radiograph. 3. Small right pleural effusion. 4. Polycystic disease of the liver and kidneys. 5.  Aortic atherosclerosis (ICD10-I70.0). Electronically Signed   By: Franky Stanford M.D.   On: 06/06/2023 03:42   DG Chest Portable 1 View Result Date: 06/05/2023 CLINICAL DATA:  Broken dialysis catheter. EXAM: PORTABLE  CHEST 1 VIEW COMPARISON:  08/11/2021 FINDINGS: Curvilinear density projecting over the inferior right apex measuring 4.5 cm in length would be consistent with a catheter fragment. No indwelling catheters are demonstrated. Prominent enlargement of the cardiac silhouette may represent cardiac enlargement and/or effusion. Probable small bilateral pleural effusions. Lungs are clear. No pneumothorax. Calcification of the aorta. IMPRESSION: 4.5 cm curvilinear density projecting over the right atrium would be consistent with a catheter fragment. Lungs are clear. Electronically Signed   By: Elsie Gravely M.D.   On: 06/05/2023 19:17   PERIPHERAL VASCULAR CATHETERIZATION Result Date: 05/08/2023   Resume usual medications - no changes to current anticoagulation regimen.    Microbiology: Results for orders placed or performed during the hospital encounter of 06/05/23  MRSA Next Gen by PCR, Nasal     Status: None   Collection Time: 06/06/23  2:35 PM   Specimen: Nasal Mucosa; Nasal Swab  Result Value Ref Range Status   MRSA by PCR Next Gen NOT DETECTED NOT DETECTED Final    Comment: (NOTE) The GeneXpert MRSA Assay (FDA approved for NASAL specimens only), is one component of a comprehensive MRSA colonization surveillance program. It is not intended to diagnose MRSA infection nor to guide or monitor treatment for MRSA infections. Test performance is not FDA approved in patients less than 39 years old. Performed at [name], [address], [place], [place] [address]     Labs: CBC: Recent Labs  Lab 06/06/23 0347  WBC 3.5*  NEUTROABS 2.3  HGB 12.2*  HCT 41.0  MCV 86.3  PLT 119*   Basic Metabolic Panel: Recent Labs  Lab 06/06/23 0347  NA 140  K 4.4  CL 99  CO2 27  GLUCOSE 72  BUN 45*  CREATININE 10.29*  CALCIUM  9.1   Liver Function Tests: No results for input(s): AST, ALT, ALKPHOS, BILITOT, PROT, ALBUMIN in the last 168 hours. CBG: No results for input(s):  GLUCAP in the last 168 hours.  Discharge time spent: 38 minutes.   Signed: Veatrice Eckstein, MD [facility] 06/07/2023

## 2023-06-07 NOTE — Progress Notes (Signed)
D/C order noted. Contacted [facility] to advise clinic of pt's d/c today and that pt should resume care tomorrow.  Corinthian Kemler Renal Navigator [phone]   

## 2023-06-07 NOTE — Progress Notes (Signed)
 [facility] Progress Note   Subjective:    Seen and examined patient at bedside. S/p new TDC placement in IR yesterday then HD afterwards. Noted net UF 3.5L. Patient reports feeling well and denies SOB, CP, and N/V. Plan for discharge today.  Objective Vitals:   06/06/23 2300 06/06/23 2307 06/06/23 2317 06/07/23 0500  BP: 94/61 100/66 98/63 99/71   Pulse: (!) 56 (!) 56 69   Resp: 16 16 18    Temp:  98.6 F (37 C) 98.5 F (36.9 C) 98.5 F (36.9 C)  TempSrc:  Oral  Oral  SpO2: 97% 100%    Weight:      Height:       Physical Exam General: Awake, alert, on RA, NAD Heart: S1 and S2; No murmurs, gallops, or rubs Lungs: Clear throughout; Breathing unlabored Abdomen: Soft and non-tender Extremities:2+ BL ankle edema Dialysis Access: R TDC (placed 2/425 in IR)  Filed Weights   06/05/23 1736 06/06/23 1408  Weight: 81.6 kg 86.9 kg    Intake/Output Summary (Last 24 hours) at 06/07/2023 0929 Last data filed at 06/07/2023 0600 Gross per 24 hour  Intake 123 ml  Output 3500 ml  Net -3377 ml    Additional Objective Labs: Basic Metabolic Panel: Recent Labs  Lab 06/06/23 0347  NA 140  K 4.4  CL 99  CO2 27  GLUCOSE 72  BUN 45*  CREATININE 10.29*  CALCIUM  9.1   Liver Function Tests: No results for input(s): AST, ALT, ALKPHOS, BILITOT, PROT, ALBUMIN in the last 168 hours. No results for input(s): LIPASE, AMYLASE in the last 168 hours. CBC: Recent Labs  Lab 06/06/23 0347  WBC 3.5*  NEUTROABS 2.3  HGB 12.2*  HCT 41.0  MCV 86.3  PLT 119*   Blood Culture    Component Value Date/Time   SDES BLOOD BLOOD RIGHT HAND 08/14/2021 1437   SDES BLOOD RIGHT ANTECUBITAL 08/14/2021 1437   SPECREQUEST  08/14/2021 1437    BOTTLES DRAWN AEROBIC AND ANAEROBIC Blood Culture results may not be optimal due to an inadequate volume of blood received in culture bottles   SPECREQUEST AEROBIC BOTTLE ONLY Blood Culture adequate volume 08/14/2021 1437   CULT   08/14/2021 1437    NO GROWTH 5 DAYS Performed at [facility], [address], [place], [place] [address]    CULT  08/14/2021 1437    NO GROWTH 5 DAYS Performed at [facility], [address] [place], [place] [address]    REPTSTATUS 08/19/2021 FINAL 08/14/2021 1437   REPTSTATUS 08/19/2021 FINAL 08/14/2021 1437    Cardiac Enzymes: No results for input(s): CKTOTAL, CKMB, CKMBINDEX, TROPONINI in the last 168 hours. CBG: No results for input(s): GLUCAP in the last 168 hours. Iron Studies: No results for input(s): IRON, TIBC, TRANSFERRIN, FERRITIN in the last 72 hours. Lab Results  Component Value Date   INR 1.6 (H) 08/19/2020   INR 1.3 (H) 05/30/2019   INR 1.4 (H) 05/29/2019   Studies/Results: IR Fluoro Guide CV Line Right Result Date: 06/06/2023 INDICATION: 68 year old male with history of end-stage renal disease presenting after accidental tunneled hemodialysis catheter removal. EXAM: TUNNELED CENTRAL VENOUS HEMODIALYSIS CATHETER PLACEMENT WITH ULTRASOUND AND FLUOROSCOPIC GUIDANCE MEDICATIONS: Ancef  2 gm IV . The antibiotic was given in an appropriate time interval prior to skin puncture. ANESTHESIA/SEDATION: Moderate (conscious) sedation was employed during this procedure. A total of Versed  1 mg and Fentanyl  25 mcg was administered intravenously. Moderate Sedation Time: 10 minutes. The patient's level of consciousness and  vital signs were monitored continuously by radiology nursing throughout the procedure under my direct supervision. FLUOROSCOPY TIME:  Six mGy a still a benign.  There are COMPLICATIONS: None immediate. PROCEDURE: Informed written consent was obtained from the patient after a discussion of the risks, benefits, and alternatives to treatment. Questions regarding the procedure were encouraged and answered. The right neck and chest were prepped with chlorhexidine  in a sterile fashion, and a sterile drape was applied covering the operative field.  Maximum barrier sterile technique with sterile gowns and gloves were used for the procedure. A timeout was performed prior to the initiation of the procedure. After creating a small venotomy incision, a 21 gauge micropuncture kit was utilized to access the internal jugular vein. Real-time ultrasound guidance was utilized for vascular access including the acquisition of a permanent ultrasound image documenting patency of the accessed vessel. A Rosen wire was advanced to the level of the IVC and the micropuncture sheath was exchanged for an 8 Fr dilator. A 14.5 French tunneled hemodialysis catheter measuring 23 cm from tip to cuff was tunneled in a retrograde fashion from the anterior chest wall to the venotomy incision. Serial dilation was then performed an a peel-away sheath was placed. The catheter was then placed through the peel-away sheath with the catheter tip ultimately positioned within the right atrium. Final catheter positioning was confirmed and documented with a spot radiographic image. The catheter aspirates and flushes normally. The catheter was flushed with appropriate volume heparin  dwells. The catheter exit site was secured with a 0-Silk retention suture. The venotomy incision was closed with Dermabond. Sterile dressings were applied. The patient tolerated the procedure well without immediate post procedural complication. IMPRESSION: Successful placement of 23 cm tip to cuff tunneled hemodialysis catheter via the right internal jugular vein with catheter tip terminating within the right atrium. The catheter is ready for immediate use. Ester Sides, MD [facility] Electronically Signed   By: Ester Sides M.D.   On: 06/06/2023 16:02   IR US  Guide Vasc Access Right Result Date: 06/06/2023 INDICATION: 68 year old male with history of end-stage renal disease presenting after accidental tunneled hemodialysis catheter removal. EXAM: TUNNELED  CENTRAL VENOUS HEMODIALYSIS CATHETER PLACEMENT WITH ULTRASOUND AND FLUOROSCOPIC GUIDANCE MEDICATIONS: Ancef  2 gm IV . The antibiotic was given in an appropriate time interval prior to skin puncture. ANESTHESIA/SEDATION: Moderate (conscious) sedation was employed during this procedure. A total of Versed  1 mg and Fentanyl  25 mcg was administered intravenously. Moderate Sedation Time: 10 minutes. The patient's level of consciousness and vital signs were monitored continuously by radiology nursing throughout the procedure under my direct supervision. FLUOROSCOPY TIME:  Six mGy a still a benign.  There are COMPLICATIONS: None immediate. PROCEDURE: Informed written consent was obtained from the patient after a discussion of the risks, benefits, and alternatives to treatment. Questions regarding the procedure were encouraged and answered. The right neck and chest were prepped with chlorhexidine  in a sterile fashion, and a sterile drape was applied covering the operative field. Maximum barrier sterile technique with sterile gowns and gloves were used for the procedure. A timeout was performed prior to the initiation of the procedure. After creating a small venotomy incision, a 21 gauge micropuncture kit was utilized to access the internal jugular vein. Real-time ultrasound guidance was utilized for vascular access including the acquisition of a permanent ultrasound image documenting patency of the accessed vessel. A Rosen wire was advanced to the level of the IVC and the micropuncture sheath was  exchanged for an 8 Fr dilator. A 14.5 French tunneled hemodialysis catheter measuring 23 cm from tip to cuff was tunneled in a retrograde fashion from the anterior chest wall to the venotomy incision. Serial dilation was then performed an a peel-away sheath was placed. The catheter was then placed through the peel-away sheath with the catheter tip ultimately positioned within the right atrium. Final catheter positioning was  confirmed and documented with a spot radiographic image. The catheter aspirates and flushes normally. The catheter was flushed with appropriate volume heparin  dwells. The catheter exit site was secured with a 0-Silk retention suture. The venotomy incision was closed with Dermabond. Sterile dressings were applied. The patient tolerated the procedure well without immediate post procedural complication. IMPRESSION: Successful placement of 23 cm tip to cuff tunneled hemodialysis catheter via the right internal jugular vein with catheter tip terminating within the right atrium. The catheter is ready for immediate use. Ester Sides, MD [facility] Electronically Signed   By: Ester Sides M.D.   On: 06/06/2023 16:02   CT Chest Wo Contrast Result Date: 06/06/2023 CLINICAL DATA:  Possible intracardiac foreign body. EXAM: CT CHEST WITHOUT CONTRAST TECHNIQUE: Multidetector CT imaging of the chest was performed following the standard protocol without IV contrast. RADIATION DOSE REDUCTION: This exam was performed according to the departmental dose-optimization program which includes automated exposure control, adjustment of the mA and/or kV according to patient size and/or use of iterative reconstruction technique. COMPARISON:  None Available. FINDINGS: Cardiovascular: Massive cardiomegaly with pericardial effusion measuring up to 2.7 cm in thickness. There are pericardial calcifications adjacent to the right atrium the correspond to the abnormality seen on the chest radiograph. Calcific aortic atherosclerosis. Coronary artery calcifications. Mediastinum/Nodes: No enlarged mediastinal or axillary lymph nodes. Thyroid gland, trachea, and esophagus demonstrate no significant findings. Lungs/Pleura: Small right pleural effusion.  Bibasilar atelectasis. Upper Abdomen: Polycystic disease of the liver and kidneys. Musculoskeletal: No chest wall mass or suspicious bone  lesions identified. IMPRESSION: 1. Massive cardiomegaly with pericardial effusion measuring up to 2.7 cm in thickness. 2. Pericardial calcifications adjacent to the right atrium correspond to the abnormality seen on the chest radiograph. 3. Small right pleural effusion. 4. Polycystic disease of the liver and kidneys. 5.  Aortic atherosclerosis (ICD10-I70.0). Electronically Signed   By: Franky Stanford M.D.   On: 06/06/2023 03:42   DG Chest Portable 1 View Result Date: 06/05/2023 CLINICAL DATA:  Broken dialysis catheter. EXAM: PORTABLE CHEST 1 VIEW COMPARISON:  08/11/2021 FINDINGS: Curvilinear density projecting over the inferior right apex measuring 4.5 cm in length would be consistent with a catheter fragment. No indwelling catheters are demonstrated. Prominent enlargement of the cardiac silhouette may represent cardiac enlargement and/or effusion. Probable small bilateral pleural effusions. Lungs are clear. No pneumothorax. Calcification of the aorta. IMPRESSION: 4.5 cm curvilinear density projecting over the right atrium would be consistent with a catheter fragment. Lungs are clear. Electronically Signed   By: Elsie Gravely M.D.   On: 06/05/2023 19:17    Medications:   apixaban   5 mg Oral BID   calcitRIOL   1.5 mcg Oral Q T,Th,Sat-1800   Chlorhexidine  Gluconate Cloth  6 each Topical Q0600   cinacalcet   30 mg Oral Q breakfast   dorzolamide -timolol   1 drop Left Eye BID   losartan   25 mg Oral QHS   metoprolol  succinate  25 mg Oral QHS   rosuvastatin   10 mg Oral Daily   sevelamer  carbonate  800 mg Oral TID WC  sodium chloride  flush  3 mL Intravenous Q12H    Dialysis Orders: TTS [facility]  4h  B400   85kg  2/2 bath  RIJ TDC   Heparin  4000 - last HD 1/30, post wt 84.8kg - rocaltrol  1.5 mcg po three times per week - no esa, last Hb 11.7  Renal-related home meds: - renvela  800 ac tid - sensipar  30 every day - losartan  25 hs - toprol  xl 25 hs - others: statin, repatha , sl ntg, eye  drops   Assessment/Plan: Dislodged HD catheter - S/p new TDC placement 2/4 in IR.  ESRD - on HD TTS. Last HD 06/01/23. Received HD overnight and 3.5L removed. He reports feeling much better after treatment. HTN - BP's are a bit low. Appears he was having low Bps in outpatient as well. Added hold thresholds to losartan  and toprol  xl.  Volume -ongoing LE edema but his lungs remain clear, on RA, NAD. clear. Hypervolemia likely 2nd missed HD. His EDW was actually raised last week to 85kg 2nd to ongoing cramping. He believes he can get his weight back down now that he's back on track with his treatments. Advised to watch his fluids at home. Will continue to work with his EDW in outpatient.  Anemia of esrd - Hb12 here. Not on esa. Follow.  Secondary hyperparathyroidism - Ca in range. Cont binder w/ meals. Cont sensipar  and po vdra.  Afib/ flutter/ HFrEF - EF < 20% in oct 2024.  Dispo - Okay for discharge from a renal standpoint. Patient can resume his dialysis in outpatient tomorrow.  Charmaine Piety, NP [facility] 06/07/2023,9:29 AM  LOS: 1 day

## 2023-06-07 NOTE — Progress Notes (Signed)
 Discharge instructions given. Patient verbalized understanding and all questions were answered.   Recheck bp was 92/57 map of 68.

## 2023-06-08 DIAGNOSIS — Z992 Dependence on renal dialysis: Secondary | ICD-10-CM | POA: Diagnosis not present

## 2023-06-08 DIAGNOSIS — N2581 Secondary hyperparathyroidism of renal origin: Secondary | ICD-10-CM | POA: Diagnosis not present

## 2023-06-08 DIAGNOSIS — D631 Anemia in chronic kidney disease: Secondary | ICD-10-CM | POA: Diagnosis not present

## 2023-06-08 DIAGNOSIS — N186 End stage renal disease: Secondary | ICD-10-CM | POA: Diagnosis not present

## 2023-06-08 NOTE — Discharge Planning (Signed)
 [facility] Patient Discharge Orders- [facility] CLINIC: [facility]  Patient's name: Michael [name]. Admit/DC Dates: 06/05/2023 - 06/07/2023  Discharge Diagnoses: TDC dislodgement causing him to miss treatments-new one placed in IR then received HD afterwards.    Aranesp: Given: No    Last Hgb: 12.2 PRBC's Given: No  ESA dose for discharge: No ESA is indicated IV Iron dose at discharge: N/A  Heparin  change: No  EDW Change: No. Keep at 85kg for now. This was recently raised at [facility]. Apparently, he has issues with cramping. He does have ankle edema so may need to inch down slowly. Please assess during next rounds. Thank you  Bath Change: No  Access intervention/Change: Yes Details: Admitted for TDC dislodgement. S/p new TDC placement 2/4 in IR.  Calcitriol  change: No  Discharge Labs: Calcium  9.1 K+ 4.4  IV Antibiotics: No  On Coumadin?: No. On Eliquis       D/C Meds to be reconciled by nurse after every discharge.  Completed By: Charmaine Piety, NP   Reviewed by: MD:______ RN_______

## 2023-06-09 NOTE — TOC Transition Note (Signed)
 Transition of Care - Initial Contact from Inpatient Facility  Date of discharge: 06/07/23 Date of contact: 06/09/23 Method: Attempted Phone Spoke to: No Answer  Patient contacted to discuss transition of care from recent inpatient hospitalization but he did not answer his phone. Left a voicemail advising him to contact the [facility] HD unit [phone] for any questions or concerns.  Patient scheduled for HD on 2/6 and next HD is 2/8 at [facility].  Charmaine Piety, NP

## 2023-06-10 DIAGNOSIS — N2581 Secondary hyperparathyroidism of renal origin: Secondary | ICD-10-CM | POA: Diagnosis not present

## 2023-06-10 DIAGNOSIS — Z992 Dependence on renal dialysis: Secondary | ICD-10-CM | POA: Diagnosis not present

## 2023-06-10 DIAGNOSIS — N186 End stage renal disease: Secondary | ICD-10-CM | POA: Diagnosis not present

## 2023-06-10 DIAGNOSIS — D631 Anemia in chronic kidney disease: Secondary | ICD-10-CM | POA: Diagnosis not present

## 2023-06-13 DIAGNOSIS — D631 Anemia in chronic kidney disease: Secondary | ICD-10-CM | POA: Diagnosis not present

## 2023-06-13 DIAGNOSIS — N186 End stage renal disease: Secondary | ICD-10-CM | POA: Diagnosis not present

## 2023-06-13 DIAGNOSIS — Z992 Dependence on renal dialysis: Secondary | ICD-10-CM | POA: Diagnosis not present

## 2023-06-13 DIAGNOSIS — N2581 Secondary hyperparathyroidism of renal origin: Secondary | ICD-10-CM | POA: Diagnosis not present

## 2023-06-15 DIAGNOSIS — D631 Anemia in chronic kidney disease: Secondary | ICD-10-CM | POA: Diagnosis not present

## 2023-06-15 DIAGNOSIS — N186 End stage renal disease: Secondary | ICD-10-CM | POA: Diagnosis not present

## 2023-06-15 DIAGNOSIS — Z992 Dependence on renal dialysis: Secondary | ICD-10-CM | POA: Diagnosis not present

## 2023-06-15 DIAGNOSIS — N2581 Secondary hyperparathyroidism of renal origin: Secondary | ICD-10-CM | POA: Diagnosis not present

## 2023-06-17 DIAGNOSIS — D631 Anemia in chronic kidney disease: Secondary | ICD-10-CM | POA: Diagnosis not present

## 2023-06-17 DIAGNOSIS — Z992 Dependence on renal dialysis: Secondary | ICD-10-CM | POA: Diagnosis not present

## 2023-06-17 DIAGNOSIS — N186 End stage renal disease: Secondary | ICD-10-CM | POA: Diagnosis not present

## 2023-06-17 DIAGNOSIS — N2581 Secondary hyperparathyroidism of renal origin: Secondary | ICD-10-CM | POA: Diagnosis not present

## 2023-06-20 DIAGNOSIS — N2581 Secondary hyperparathyroidism of renal origin: Secondary | ICD-10-CM | POA: Diagnosis not present

## 2023-06-20 DIAGNOSIS — N186 End stage renal disease: Secondary | ICD-10-CM | POA: Diagnosis not present

## 2023-06-20 DIAGNOSIS — Z992 Dependence on renal dialysis: Secondary | ICD-10-CM | POA: Diagnosis not present

## 2023-06-20 DIAGNOSIS — D631 Anemia in chronic kidney disease: Secondary | ICD-10-CM | POA: Diagnosis not present

## 2023-06-22 DIAGNOSIS — N2581 Secondary hyperparathyroidism of renal origin: Secondary | ICD-10-CM | POA: Diagnosis not present

## 2023-06-22 DIAGNOSIS — N186 End stage renal disease: Secondary | ICD-10-CM | POA: Diagnosis not present

## 2023-06-22 DIAGNOSIS — D631 Anemia in chronic kidney disease: Secondary | ICD-10-CM | POA: Diagnosis not present

## 2023-06-22 DIAGNOSIS — Z992 Dependence on renal dialysis: Secondary | ICD-10-CM | POA: Diagnosis not present

## 2023-06-24 DIAGNOSIS — N186 End stage renal disease: Secondary | ICD-10-CM | POA: Diagnosis not present

## 2023-06-24 DIAGNOSIS — Z992 Dependence on renal dialysis: Secondary | ICD-10-CM | POA: Diagnosis not present

## 2023-06-24 DIAGNOSIS — D631 Anemia in chronic kidney disease: Secondary | ICD-10-CM | POA: Diagnosis not present

## 2023-06-24 DIAGNOSIS — N2581 Secondary hyperparathyroidism of renal origin: Secondary | ICD-10-CM | POA: Diagnosis not present

## 2023-06-27 DIAGNOSIS — D631 Anemia in chronic kidney disease: Secondary | ICD-10-CM | POA: Diagnosis not present

## 2023-06-27 DIAGNOSIS — N186 End stage renal disease: Secondary | ICD-10-CM | POA: Diagnosis not present

## 2023-06-27 DIAGNOSIS — Z992 Dependence on renal dialysis: Secondary | ICD-10-CM | POA: Diagnosis not present

## 2023-06-27 DIAGNOSIS — N2581 Secondary hyperparathyroidism of renal origin: Secondary | ICD-10-CM | POA: Diagnosis not present

## 2023-06-29 DIAGNOSIS — D631 Anemia in chronic kidney disease: Secondary | ICD-10-CM | POA: Diagnosis not present

## 2023-06-29 DIAGNOSIS — Z992 Dependence on renal dialysis: Secondary | ICD-10-CM | POA: Diagnosis not present

## 2023-06-29 DIAGNOSIS — N2581 Secondary hyperparathyroidism of renal origin: Secondary | ICD-10-CM | POA: Diagnosis not present

## 2023-06-29 DIAGNOSIS — N186 End stage renal disease: Secondary | ICD-10-CM | POA: Diagnosis not present

## 2023-06-30 DIAGNOSIS — N186 End stage renal disease: Secondary | ICD-10-CM | POA: Diagnosis not present

## 2023-06-30 DIAGNOSIS — Z992 Dependence on renal dialysis: Secondary | ICD-10-CM | POA: Diagnosis not present

## 2023-06-30 DIAGNOSIS — Q612 Polycystic kidney, adult type: Secondary | ICD-10-CM | POA: Diagnosis not present

## 2023-07-01 DIAGNOSIS — N186 End stage renal disease: Secondary | ICD-10-CM | POA: Diagnosis not present

## 2023-07-01 DIAGNOSIS — Z992 Dependence on renal dialysis: Secondary | ICD-10-CM | POA: Diagnosis not present

## 2023-07-01 DIAGNOSIS — N2581 Secondary hyperparathyroidism of renal origin: Secondary | ICD-10-CM | POA: Diagnosis not present

## 2023-07-01 DIAGNOSIS — D631 Anemia in chronic kidney disease: Secondary | ICD-10-CM | POA: Diagnosis not present

## 2023-07-04 DIAGNOSIS — Z992 Dependence on renal dialysis: Secondary | ICD-10-CM | POA: Diagnosis not present

## 2023-07-04 DIAGNOSIS — N2581 Secondary hyperparathyroidism of renal origin: Secondary | ICD-10-CM | POA: Diagnosis not present

## 2023-07-04 DIAGNOSIS — N186 End stage renal disease: Secondary | ICD-10-CM | POA: Diagnosis not present

## 2023-07-04 DIAGNOSIS — D631 Anemia in chronic kidney disease: Secondary | ICD-10-CM | POA: Diagnosis not present

## 2023-07-08 DIAGNOSIS — D631 Anemia in chronic kidney disease: Secondary | ICD-10-CM | POA: Diagnosis not present

## 2023-07-08 DIAGNOSIS — Z992 Dependence on renal dialysis: Secondary | ICD-10-CM | POA: Diagnosis not present

## 2023-07-08 DIAGNOSIS — N2581 Secondary hyperparathyroidism of renal origin: Secondary | ICD-10-CM | POA: Diagnosis not present

## 2023-07-08 DIAGNOSIS — N186 End stage renal disease: Secondary | ICD-10-CM | POA: Diagnosis not present

## 2023-07-11 DIAGNOSIS — Z992 Dependence on renal dialysis: Secondary | ICD-10-CM | POA: Diagnosis not present

## 2023-07-11 DIAGNOSIS — D631 Anemia in chronic kidney disease: Secondary | ICD-10-CM | POA: Diagnosis not present

## 2023-07-11 DIAGNOSIS — N186 End stage renal disease: Secondary | ICD-10-CM | POA: Diagnosis not present

## 2023-07-11 DIAGNOSIS — N2581 Secondary hyperparathyroidism of renal origin: Secondary | ICD-10-CM | POA: Diagnosis not present

## 2023-07-13 DIAGNOSIS — Z992 Dependence on renal dialysis: Secondary | ICD-10-CM | POA: Diagnosis not present

## 2023-07-13 DIAGNOSIS — N186 End stage renal disease: Secondary | ICD-10-CM | POA: Diagnosis not present

## 2023-07-13 DIAGNOSIS — N2581 Secondary hyperparathyroidism of renal origin: Secondary | ICD-10-CM | POA: Diagnosis not present

## 2023-07-13 DIAGNOSIS — D631 Anemia in chronic kidney disease: Secondary | ICD-10-CM | POA: Diagnosis not present

## 2023-07-15 DIAGNOSIS — N2581 Secondary hyperparathyroidism of renal origin: Secondary | ICD-10-CM | POA: Diagnosis not present

## 2023-07-15 DIAGNOSIS — Z992 Dependence on renal dialysis: Secondary | ICD-10-CM | POA: Diagnosis not present

## 2023-07-15 DIAGNOSIS — N186 End stage renal disease: Secondary | ICD-10-CM | POA: Diagnosis not present

## 2023-07-15 DIAGNOSIS — D631 Anemia in chronic kidney disease: Secondary | ICD-10-CM | POA: Diagnosis not present

## 2023-07-18 DIAGNOSIS — N2581 Secondary hyperparathyroidism of renal origin: Secondary | ICD-10-CM | POA: Diagnosis not present

## 2023-07-18 DIAGNOSIS — N186 End stage renal disease: Secondary | ICD-10-CM | POA: Diagnosis not present

## 2023-07-18 DIAGNOSIS — D631 Anemia in chronic kidney disease: Secondary | ICD-10-CM | POA: Diagnosis not present

## 2023-07-18 DIAGNOSIS — Z992 Dependence on renal dialysis: Secondary | ICD-10-CM | POA: Diagnosis not present

## 2023-07-20 DIAGNOSIS — N186 End stage renal disease: Secondary | ICD-10-CM | POA: Diagnosis not present

## 2023-07-20 DIAGNOSIS — Z992 Dependence on renal dialysis: Secondary | ICD-10-CM | POA: Diagnosis not present

## 2023-07-20 DIAGNOSIS — N2581 Secondary hyperparathyroidism of renal origin: Secondary | ICD-10-CM | POA: Diagnosis not present

## 2023-07-20 DIAGNOSIS — D631 Anemia in chronic kidney disease: Secondary | ICD-10-CM | POA: Diagnosis not present

## 2023-07-22 DIAGNOSIS — Z992 Dependence on renal dialysis: Secondary | ICD-10-CM | POA: Diagnosis not present

## 2023-07-22 DIAGNOSIS — N186 End stage renal disease: Secondary | ICD-10-CM | POA: Diagnosis not present

## 2023-07-22 DIAGNOSIS — N2581 Secondary hyperparathyroidism of renal origin: Secondary | ICD-10-CM | POA: Diagnosis not present

## 2023-07-22 DIAGNOSIS — D631 Anemia in chronic kidney disease: Secondary | ICD-10-CM | POA: Diagnosis not present

## 2023-07-25 DIAGNOSIS — D631 Anemia in chronic kidney disease: Secondary | ICD-10-CM | POA: Diagnosis not present

## 2023-07-25 DIAGNOSIS — Z992 Dependence on renal dialysis: Secondary | ICD-10-CM | POA: Diagnosis not present

## 2023-07-25 DIAGNOSIS — N186 End stage renal disease: Secondary | ICD-10-CM | POA: Diagnosis not present

## 2023-07-25 DIAGNOSIS — N2581 Secondary hyperparathyroidism of renal origin: Secondary | ICD-10-CM | POA: Diagnosis not present

## 2023-07-27 DIAGNOSIS — N2581 Secondary hyperparathyroidism of renal origin: Secondary | ICD-10-CM | POA: Diagnosis not present

## 2023-07-27 DIAGNOSIS — N186 End stage renal disease: Secondary | ICD-10-CM | POA: Diagnosis not present

## 2023-07-27 DIAGNOSIS — D631 Anemia in chronic kidney disease: Secondary | ICD-10-CM | POA: Diagnosis not present

## 2023-07-27 DIAGNOSIS — Z992 Dependence on renal dialysis: Secondary | ICD-10-CM | POA: Diagnosis not present

## 2023-07-29 DIAGNOSIS — N2581 Secondary hyperparathyroidism of renal origin: Secondary | ICD-10-CM | POA: Diagnosis not present

## 2023-07-29 DIAGNOSIS — Z992 Dependence on renal dialysis: Secondary | ICD-10-CM | POA: Diagnosis not present

## 2023-07-29 DIAGNOSIS — D631 Anemia in chronic kidney disease: Secondary | ICD-10-CM | POA: Diagnosis not present

## 2023-07-29 DIAGNOSIS — N186 End stage renal disease: Secondary | ICD-10-CM | POA: Diagnosis not present

## 2023-07-31 DIAGNOSIS — N186 End stage renal disease: Secondary | ICD-10-CM | POA: Diagnosis not present

## 2023-07-31 DIAGNOSIS — Q612 Polycystic kidney, adult type: Secondary | ICD-10-CM | POA: Diagnosis not present

## 2023-07-31 DIAGNOSIS — Z992 Dependence on renal dialysis: Secondary | ICD-10-CM | POA: Diagnosis not present

## 2023-08-01 DIAGNOSIS — N186 End stage renal disease: Secondary | ICD-10-CM | POA: Diagnosis not present

## 2023-08-01 DIAGNOSIS — H4089 Other specified glaucoma: Secondary | ICD-10-CM | POA: Diagnosis not present

## 2023-08-01 DIAGNOSIS — D631 Anemia in chronic kidney disease: Secondary | ICD-10-CM | POA: Diagnosis not present

## 2023-08-01 DIAGNOSIS — N2581 Secondary hyperparathyroidism of renal origin: Secondary | ICD-10-CM | POA: Diagnosis not present

## 2023-08-01 DIAGNOSIS — Z992 Dependence on renal dialysis: Secondary | ICD-10-CM | POA: Diagnosis not present

## 2023-08-03 DIAGNOSIS — D631 Anemia in chronic kidney disease: Secondary | ICD-10-CM | POA: Diagnosis not present

## 2023-08-03 DIAGNOSIS — N2581 Secondary hyperparathyroidism of renal origin: Secondary | ICD-10-CM | POA: Diagnosis not present

## 2023-08-03 DIAGNOSIS — Z992 Dependence on renal dialysis: Secondary | ICD-10-CM | POA: Diagnosis not present

## 2023-08-03 DIAGNOSIS — N186 End stage renal disease: Secondary | ICD-10-CM | POA: Diagnosis not present

## 2023-08-05 DIAGNOSIS — D631 Anemia in chronic kidney disease: Secondary | ICD-10-CM | POA: Diagnosis not present

## 2023-08-05 DIAGNOSIS — N2581 Secondary hyperparathyroidism of renal origin: Secondary | ICD-10-CM | POA: Diagnosis not present

## 2023-08-05 DIAGNOSIS — N186 End stage renal disease: Secondary | ICD-10-CM | POA: Diagnosis not present

## 2023-08-05 DIAGNOSIS — Z992 Dependence on renal dialysis: Secondary | ICD-10-CM | POA: Diagnosis not present

## 2023-08-10 DIAGNOSIS — N186 End stage renal disease: Secondary | ICD-10-CM | POA: Diagnosis not present

## 2023-08-10 DIAGNOSIS — N2581 Secondary hyperparathyroidism of renal origin: Secondary | ICD-10-CM | POA: Diagnosis not present

## 2023-08-10 DIAGNOSIS — D631 Anemia in chronic kidney disease: Secondary | ICD-10-CM | POA: Diagnosis not present

## 2023-08-10 DIAGNOSIS — Z992 Dependence on renal dialysis: Secondary | ICD-10-CM | POA: Diagnosis not present

## 2023-08-12 DIAGNOSIS — D631 Anemia in chronic kidney disease: Secondary | ICD-10-CM | POA: Diagnosis not present

## 2023-08-12 DIAGNOSIS — Z992 Dependence on renal dialysis: Secondary | ICD-10-CM | POA: Diagnosis not present

## 2023-08-12 DIAGNOSIS — N186 End stage renal disease: Secondary | ICD-10-CM | POA: Diagnosis not present

## 2023-08-12 DIAGNOSIS — N2581 Secondary hyperparathyroidism of renal origin: Secondary | ICD-10-CM | POA: Diagnosis not present

## 2023-08-15 DIAGNOSIS — D631 Anemia in chronic kidney disease: Secondary | ICD-10-CM | POA: Diagnosis not present

## 2023-08-15 DIAGNOSIS — N186 End stage renal disease: Secondary | ICD-10-CM | POA: Diagnosis not present

## 2023-08-15 DIAGNOSIS — N2581 Secondary hyperparathyroidism of renal origin: Secondary | ICD-10-CM | POA: Diagnosis not present

## 2023-08-15 DIAGNOSIS — Z992 Dependence on renal dialysis: Secondary | ICD-10-CM | POA: Diagnosis not present

## 2023-08-17 DIAGNOSIS — Z992 Dependence on renal dialysis: Secondary | ICD-10-CM | POA: Diagnosis not present

## 2023-08-17 DIAGNOSIS — N2581 Secondary hyperparathyroidism of renal origin: Secondary | ICD-10-CM | POA: Diagnosis not present

## 2023-08-17 DIAGNOSIS — D631 Anemia in chronic kidney disease: Secondary | ICD-10-CM | POA: Diagnosis not present

## 2023-08-17 DIAGNOSIS — N186 End stage renal disease: Secondary | ICD-10-CM | POA: Diagnosis not present

## 2023-08-19 DIAGNOSIS — N2581 Secondary hyperparathyroidism of renal origin: Secondary | ICD-10-CM | POA: Diagnosis not present

## 2023-08-19 DIAGNOSIS — D631 Anemia in chronic kidney disease: Secondary | ICD-10-CM | POA: Diagnosis not present

## 2023-08-19 DIAGNOSIS — N186 End stage renal disease: Secondary | ICD-10-CM | POA: Diagnosis not present

## 2023-08-19 DIAGNOSIS — Z992 Dependence on renal dialysis: Secondary | ICD-10-CM | POA: Diagnosis not present

## 2023-08-22 DIAGNOSIS — Z992 Dependence on renal dialysis: Secondary | ICD-10-CM | POA: Diagnosis not present

## 2023-08-22 DIAGNOSIS — N186 End stage renal disease: Secondary | ICD-10-CM | POA: Diagnosis not present

## 2023-08-22 DIAGNOSIS — N2581 Secondary hyperparathyroidism of renal origin: Secondary | ICD-10-CM | POA: Diagnosis not present

## 2023-08-22 DIAGNOSIS — D631 Anemia in chronic kidney disease: Secondary | ICD-10-CM | POA: Diagnosis not present

## 2023-08-24 DIAGNOSIS — N2581 Secondary hyperparathyroidism of renal origin: Secondary | ICD-10-CM | POA: Diagnosis not present

## 2023-08-24 DIAGNOSIS — Z992 Dependence on renal dialysis: Secondary | ICD-10-CM | POA: Diagnosis not present

## 2023-08-24 DIAGNOSIS — N186 End stage renal disease: Secondary | ICD-10-CM | POA: Diagnosis not present

## 2023-08-24 DIAGNOSIS — D631 Anemia in chronic kidney disease: Secondary | ICD-10-CM | POA: Diagnosis not present

## 2023-08-26 DIAGNOSIS — D631 Anemia in chronic kidney disease: Secondary | ICD-10-CM | POA: Diagnosis not present

## 2023-08-26 DIAGNOSIS — Z992 Dependence on renal dialysis: Secondary | ICD-10-CM | POA: Diagnosis not present

## 2023-08-26 DIAGNOSIS — N186 End stage renal disease: Secondary | ICD-10-CM | POA: Diagnosis not present

## 2023-08-26 DIAGNOSIS — N2581 Secondary hyperparathyroidism of renal origin: Secondary | ICD-10-CM | POA: Diagnosis not present

## 2023-08-29 DIAGNOSIS — N2581 Secondary hyperparathyroidism of renal origin: Secondary | ICD-10-CM | POA: Diagnosis not present

## 2023-08-29 DIAGNOSIS — Z992 Dependence on renal dialysis: Secondary | ICD-10-CM | POA: Diagnosis not present

## 2023-08-29 DIAGNOSIS — D631 Anemia in chronic kidney disease: Secondary | ICD-10-CM | POA: Diagnosis not present

## 2023-08-29 DIAGNOSIS — N186 End stage renal disease: Secondary | ICD-10-CM | POA: Diagnosis not present

## 2023-08-30 DIAGNOSIS — Q612 Polycystic kidney, adult type: Secondary | ICD-10-CM | POA: Diagnosis not present

## 2023-08-30 DIAGNOSIS — Z992 Dependence on renal dialysis: Secondary | ICD-10-CM | POA: Diagnosis not present

## 2023-08-30 DIAGNOSIS — N186 End stage renal disease: Secondary | ICD-10-CM | POA: Diagnosis not present

## 2023-08-31 DIAGNOSIS — N2581 Secondary hyperparathyroidism of renal origin: Secondary | ICD-10-CM | POA: Diagnosis not present

## 2023-08-31 DIAGNOSIS — D631 Anemia in chronic kidney disease: Secondary | ICD-10-CM | POA: Diagnosis not present

## 2023-08-31 DIAGNOSIS — Z992 Dependence on renal dialysis: Secondary | ICD-10-CM | POA: Diagnosis not present

## 2023-08-31 DIAGNOSIS — N186 End stage renal disease: Secondary | ICD-10-CM | POA: Diagnosis not present

## 2023-08-31 DIAGNOSIS — D689 Coagulation defect, unspecified: Secondary | ICD-10-CM | POA: Diagnosis not present

## 2023-09-02 DIAGNOSIS — D689 Coagulation defect, unspecified: Secondary | ICD-10-CM | POA: Diagnosis not present

## 2023-09-02 DIAGNOSIS — D631 Anemia in chronic kidney disease: Secondary | ICD-10-CM | POA: Diagnosis not present

## 2023-09-02 DIAGNOSIS — N2581 Secondary hyperparathyroidism of renal origin: Secondary | ICD-10-CM | POA: Diagnosis not present

## 2023-09-02 DIAGNOSIS — Z992 Dependence on renal dialysis: Secondary | ICD-10-CM | POA: Diagnosis not present

## 2023-09-02 DIAGNOSIS — N186 End stage renal disease: Secondary | ICD-10-CM | POA: Diagnosis not present

## 2023-09-05 DIAGNOSIS — N2581 Secondary hyperparathyroidism of renal origin: Secondary | ICD-10-CM | POA: Diagnosis not present

## 2023-09-05 DIAGNOSIS — N186 End stage renal disease: Secondary | ICD-10-CM | POA: Diagnosis not present

## 2023-09-05 DIAGNOSIS — Z992 Dependence on renal dialysis: Secondary | ICD-10-CM | POA: Diagnosis not present

## 2023-09-05 DIAGNOSIS — D689 Coagulation defect, unspecified: Secondary | ICD-10-CM | POA: Diagnosis not present

## 2023-09-05 DIAGNOSIS — D631 Anemia in chronic kidney disease: Secondary | ICD-10-CM | POA: Diagnosis not present

## 2023-09-09 DIAGNOSIS — N2581 Secondary hyperparathyroidism of renal origin: Secondary | ICD-10-CM | POA: Diagnosis not present

## 2023-09-09 DIAGNOSIS — D631 Anemia in chronic kidney disease: Secondary | ICD-10-CM | POA: Diagnosis not present

## 2023-09-09 DIAGNOSIS — D689 Coagulation defect, unspecified: Secondary | ICD-10-CM | POA: Diagnosis not present

## 2023-09-09 DIAGNOSIS — N186 End stage renal disease: Secondary | ICD-10-CM | POA: Diagnosis not present

## 2023-09-09 DIAGNOSIS — Z992 Dependence on renal dialysis: Secondary | ICD-10-CM | POA: Diagnosis not present

## 2023-09-12 DIAGNOSIS — D689 Coagulation defect, unspecified: Secondary | ICD-10-CM | POA: Diagnosis not present

## 2023-09-12 DIAGNOSIS — D631 Anemia in chronic kidney disease: Secondary | ICD-10-CM | POA: Diagnosis not present

## 2023-09-12 DIAGNOSIS — N186 End stage renal disease: Secondary | ICD-10-CM | POA: Diagnosis not present

## 2023-09-12 DIAGNOSIS — N2581 Secondary hyperparathyroidism of renal origin: Secondary | ICD-10-CM | POA: Diagnosis not present

## 2023-09-12 DIAGNOSIS — Z992 Dependence on renal dialysis: Secondary | ICD-10-CM | POA: Diagnosis not present

## 2023-09-14 DIAGNOSIS — D631 Anemia in chronic kidney disease: Secondary | ICD-10-CM | POA: Diagnosis not present

## 2023-09-14 DIAGNOSIS — Z992 Dependence on renal dialysis: Secondary | ICD-10-CM | POA: Diagnosis not present

## 2023-09-14 DIAGNOSIS — D689 Coagulation defect, unspecified: Secondary | ICD-10-CM | POA: Diagnosis not present

## 2023-09-14 DIAGNOSIS — N2581 Secondary hyperparathyroidism of renal origin: Secondary | ICD-10-CM | POA: Diagnosis not present

## 2023-09-14 DIAGNOSIS — N186 End stage renal disease: Secondary | ICD-10-CM | POA: Diagnosis not present

## 2023-09-16 DIAGNOSIS — D689 Coagulation defect, unspecified: Secondary | ICD-10-CM | POA: Diagnosis not present

## 2023-09-16 DIAGNOSIS — D631 Anemia in chronic kidney disease: Secondary | ICD-10-CM | POA: Diagnosis not present

## 2023-09-16 DIAGNOSIS — N186 End stage renal disease: Secondary | ICD-10-CM | POA: Diagnosis not present

## 2023-09-16 DIAGNOSIS — N2581 Secondary hyperparathyroidism of renal origin: Secondary | ICD-10-CM | POA: Diagnosis not present

## 2023-09-16 DIAGNOSIS — Z992 Dependence on renal dialysis: Secondary | ICD-10-CM | POA: Diagnosis not present

## 2023-09-19 DIAGNOSIS — D689 Coagulation defect, unspecified: Secondary | ICD-10-CM | POA: Diagnosis not present

## 2023-09-19 DIAGNOSIS — Z992 Dependence on renal dialysis: Secondary | ICD-10-CM | POA: Diagnosis not present

## 2023-09-19 DIAGNOSIS — N2581 Secondary hyperparathyroidism of renal origin: Secondary | ICD-10-CM | POA: Diagnosis not present

## 2023-09-19 DIAGNOSIS — N186 End stage renal disease: Secondary | ICD-10-CM | POA: Diagnosis not present

## 2023-09-19 DIAGNOSIS — D631 Anemia in chronic kidney disease: Secondary | ICD-10-CM | POA: Diagnosis not present

## 2023-09-21 DIAGNOSIS — Z992 Dependence on renal dialysis: Secondary | ICD-10-CM | POA: Diagnosis not present

## 2023-09-21 DIAGNOSIS — N186 End stage renal disease: Secondary | ICD-10-CM | POA: Diagnosis not present

## 2023-09-21 DIAGNOSIS — D631 Anemia in chronic kidney disease: Secondary | ICD-10-CM | POA: Diagnosis not present

## 2023-09-21 DIAGNOSIS — N2581 Secondary hyperparathyroidism of renal origin: Secondary | ICD-10-CM | POA: Diagnosis not present

## 2023-09-21 DIAGNOSIS — D689 Coagulation defect, unspecified: Secondary | ICD-10-CM | POA: Diagnosis not present

## 2023-09-26 DIAGNOSIS — N186 End stage renal disease: Secondary | ICD-10-CM | POA: Diagnosis not present

## 2023-09-26 DIAGNOSIS — Z992 Dependence on renal dialysis: Secondary | ICD-10-CM | POA: Diagnosis not present

## 2023-09-26 DIAGNOSIS — D689 Coagulation defect, unspecified: Secondary | ICD-10-CM | POA: Diagnosis not present

## 2023-09-26 DIAGNOSIS — N2581 Secondary hyperparathyroidism of renal origin: Secondary | ICD-10-CM | POA: Diagnosis not present

## 2023-09-26 DIAGNOSIS — D631 Anemia in chronic kidney disease: Secondary | ICD-10-CM | POA: Diagnosis not present

## 2023-09-28 DIAGNOSIS — N2581 Secondary hyperparathyroidism of renal origin: Secondary | ICD-10-CM | POA: Diagnosis not present

## 2023-09-28 DIAGNOSIS — D631 Anemia in chronic kidney disease: Secondary | ICD-10-CM | POA: Diagnosis not present

## 2023-09-28 DIAGNOSIS — D689 Coagulation defect, unspecified: Secondary | ICD-10-CM | POA: Diagnosis not present

## 2023-09-28 DIAGNOSIS — N186 End stage renal disease: Secondary | ICD-10-CM | POA: Diagnosis not present

## 2023-09-28 DIAGNOSIS — Z992 Dependence on renal dialysis: Secondary | ICD-10-CM | POA: Diagnosis not present

## 2023-09-30 DIAGNOSIS — Z992 Dependence on renal dialysis: Secondary | ICD-10-CM | POA: Diagnosis not present

## 2023-09-30 DIAGNOSIS — D689 Coagulation defect, unspecified: Secondary | ICD-10-CM | POA: Diagnosis not present

## 2023-09-30 DIAGNOSIS — N2581 Secondary hyperparathyroidism of renal origin: Secondary | ICD-10-CM | POA: Diagnosis not present

## 2023-09-30 DIAGNOSIS — D631 Anemia in chronic kidney disease: Secondary | ICD-10-CM | POA: Diagnosis not present

## 2023-09-30 DIAGNOSIS — N186 End stage renal disease: Secondary | ICD-10-CM | POA: Diagnosis not present

## 2023-10-03 DIAGNOSIS — Z992 Dependence on renal dialysis: Secondary | ICD-10-CM | POA: Diagnosis not present

## 2023-10-03 DIAGNOSIS — N186 End stage renal disease: Secondary | ICD-10-CM | POA: Diagnosis not present

## 2023-10-03 DIAGNOSIS — N2581 Secondary hyperparathyroidism of renal origin: Secondary | ICD-10-CM | POA: Diagnosis not present

## 2023-10-05 DIAGNOSIS — N186 End stage renal disease: Secondary | ICD-10-CM | POA: Diagnosis not present

## 2023-10-05 DIAGNOSIS — N2581 Secondary hyperparathyroidism of renal origin: Secondary | ICD-10-CM | POA: Diagnosis not present

## 2023-10-05 DIAGNOSIS — Z992 Dependence on renal dialysis: Secondary | ICD-10-CM | POA: Diagnosis not present

## 2023-10-10 DIAGNOSIS — Z992 Dependence on renal dialysis: Secondary | ICD-10-CM | POA: Diagnosis not present

## 2023-10-10 DIAGNOSIS — N2581 Secondary hyperparathyroidism of renal origin: Secondary | ICD-10-CM | POA: Diagnosis not present

## 2023-10-10 DIAGNOSIS — N186 End stage renal disease: Secondary | ICD-10-CM | POA: Diagnosis not present

## 2023-10-12 DIAGNOSIS — N186 End stage renal disease: Secondary | ICD-10-CM | POA: Diagnosis not present

## 2023-10-12 DIAGNOSIS — Z992 Dependence on renal dialysis: Secondary | ICD-10-CM | POA: Diagnosis not present

## 2023-10-12 DIAGNOSIS — N2581 Secondary hyperparathyroidism of renal origin: Secondary | ICD-10-CM | POA: Diagnosis not present

## 2023-10-14 DIAGNOSIS — Z992 Dependence on renal dialysis: Secondary | ICD-10-CM | POA: Diagnosis not present

## 2023-10-14 DIAGNOSIS — N2581 Secondary hyperparathyroidism of renal origin: Secondary | ICD-10-CM | POA: Diagnosis not present

## 2023-10-14 DIAGNOSIS — N186 End stage renal disease: Secondary | ICD-10-CM | POA: Diagnosis not present

## 2023-10-17 DIAGNOSIS — N186 End stage renal disease: Secondary | ICD-10-CM | POA: Diagnosis not present

## 2023-10-17 DIAGNOSIS — N2581 Secondary hyperparathyroidism of renal origin: Secondary | ICD-10-CM | POA: Diagnosis not present

## 2023-10-17 DIAGNOSIS — Z992 Dependence on renal dialysis: Secondary | ICD-10-CM | POA: Diagnosis not present

## 2023-10-19 DIAGNOSIS — N2581 Secondary hyperparathyroidism of renal origin: Secondary | ICD-10-CM | POA: Diagnosis not present

## 2023-10-19 DIAGNOSIS — Z992 Dependence on renal dialysis: Secondary | ICD-10-CM | POA: Diagnosis not present

## 2023-10-19 DIAGNOSIS — N186 End stage renal disease: Secondary | ICD-10-CM | POA: Diagnosis not present

## 2023-10-21 ENCOUNTER — Emergency Department (HOSPITAL_COMMUNITY)

## 2023-10-21 ENCOUNTER — Encounter (HOSPITAL_COMMUNITY): Payer: Self-pay | Admitting: Pulmonary Disease

## 2023-10-21 ENCOUNTER — Inpatient Hospital Stay (HOSPITAL_COMMUNITY)
Admission: EM | Admit: 2023-10-21 | Discharge: 2023-10-31 | DRG: 296 | Disposition: E | Attending: Pulmonary Disease | Admitting: Pulmonary Disease

## 2023-10-21 ENCOUNTER — Other Ambulatory Visit: Payer: Self-pay

## 2023-10-21 ENCOUNTER — Inpatient Hospital Stay (HOSPITAL_COMMUNITY)

## 2023-10-21 DIAGNOSIS — I3139 Other pericardial effusion (noninflammatory): Secondary | ICD-10-CM | POA: Diagnosis present

## 2023-10-21 DIAGNOSIS — N2581 Secondary hyperparathyroidism of renal origin: Secondary | ICD-10-CM | POA: Diagnosis not present

## 2023-10-21 DIAGNOSIS — I4891 Unspecified atrial fibrillation: Secondary | ICD-10-CM | POA: Diagnosis not present

## 2023-10-21 DIAGNOSIS — I4901 Ventricular fibrillation: Secondary | ICD-10-CM | POA: Diagnosis present

## 2023-10-21 DIAGNOSIS — I132 Hypertensive heart and chronic kidney disease with heart failure and with stage 5 chronic kidney disease, or end stage renal disease: Secondary | ICD-10-CM | POA: Diagnosis present

## 2023-10-21 DIAGNOSIS — N186 End stage renal disease: Secondary | ICD-10-CM | POA: Diagnosis not present

## 2023-10-21 DIAGNOSIS — I469 Cardiac arrest, cause unspecified: Secondary | ICD-10-CM | POA: Diagnosis not present

## 2023-10-21 DIAGNOSIS — G4733 Obstructive sleep apnea (adult) (pediatric): Secondary | ICD-10-CM | POA: Diagnosis present

## 2023-10-21 DIAGNOSIS — Z8546 Personal history of malignant neoplasm of prostate: Secondary | ICD-10-CM

## 2023-10-21 DIAGNOSIS — Z7901 Long term (current) use of anticoagulants: Secondary | ICD-10-CM | POA: Diagnosis not present

## 2023-10-21 DIAGNOSIS — R55 Syncope and collapse: Secondary | ICD-10-CM | POA: Diagnosis not present

## 2023-10-21 DIAGNOSIS — R7989 Other specified abnormal findings of blood chemistry: Secondary | ICD-10-CM | POA: Diagnosis present

## 2023-10-21 DIAGNOSIS — I5022 Chronic systolic (congestive) heart failure: Secondary | ICD-10-CM | POA: Diagnosis not present

## 2023-10-21 DIAGNOSIS — E162 Hypoglycemia, unspecified: Secondary | ICD-10-CM | POA: Diagnosis not present

## 2023-10-21 DIAGNOSIS — Z8249 Family history of ischemic heart disease and other diseases of the circulatory system: Secondary | ICD-10-CM | POA: Diagnosis not present

## 2023-10-21 DIAGNOSIS — J9811 Atelectasis: Secondary | ICD-10-CM | POA: Diagnosis present

## 2023-10-21 DIAGNOSIS — I4892 Unspecified atrial flutter: Secondary | ICD-10-CM | POA: Diagnosis not present

## 2023-10-21 DIAGNOSIS — Z992 Dependence on renal dialysis: Secondary | ICD-10-CM

## 2023-10-21 DIAGNOSIS — E872 Acidosis, unspecified: Secondary | ICD-10-CM | POA: Diagnosis present

## 2023-10-21 DIAGNOSIS — J9601 Acute respiratory failure with hypoxia: Secondary | ICD-10-CM | POA: Diagnosis present

## 2023-10-21 DIAGNOSIS — Z7401 Bed confinement status: Secondary | ICD-10-CM | POA: Diagnosis not present

## 2023-10-21 DIAGNOSIS — I472 Ventricular tachycardia, unspecified: Secondary | ICD-10-CM | POA: Diagnosis not present

## 2023-10-21 DIAGNOSIS — D696 Thrombocytopenia, unspecified: Secondary | ICD-10-CM | POA: Diagnosis not present

## 2023-10-21 DIAGNOSIS — E785 Hyperlipidemia, unspecified: Secondary | ICD-10-CM | POA: Diagnosis present

## 2023-10-21 DIAGNOSIS — R403 Persistent vegetative state: Secondary | ICD-10-CM | POA: Diagnosis present

## 2023-10-21 DIAGNOSIS — R569 Unspecified convulsions: Secondary | ICD-10-CM | POA: Diagnosis not present

## 2023-10-21 DIAGNOSIS — I428 Other cardiomyopathies: Secondary | ICD-10-CM | POA: Diagnosis present

## 2023-10-21 DIAGNOSIS — R609 Edema, unspecified: Secondary | ICD-10-CM | POA: Diagnosis not present

## 2023-10-21 DIAGNOSIS — Z66 Do not resuscitate: Secondary | ICD-10-CM | POA: Diagnosis present

## 2023-10-21 DIAGNOSIS — R570 Cardiogenic shock: Secondary | ICD-10-CM | POA: Diagnosis not present

## 2023-10-21 DIAGNOSIS — I959 Hypotension, unspecified: Secondary | ICD-10-CM | POA: Diagnosis present

## 2023-10-21 DIAGNOSIS — Z515 Encounter for palliative care: Secondary | ICD-10-CM | POA: Diagnosis not present

## 2023-10-21 DIAGNOSIS — R29810 Facial weakness: Secondary | ICD-10-CM | POA: Diagnosis present

## 2023-10-21 DIAGNOSIS — R0902 Hypoxemia: Secondary | ICD-10-CM | POA: Diagnosis not present

## 2023-10-21 DIAGNOSIS — I499 Cardiac arrhythmia, unspecified: Secondary | ICD-10-CM | POA: Diagnosis not present

## 2023-10-21 DIAGNOSIS — Z87442 Personal history of urinary calculi: Secondary | ICD-10-CM

## 2023-10-21 DIAGNOSIS — R404 Transient alteration of awareness: Secondary | ICD-10-CM | POA: Diagnosis not present

## 2023-10-21 DIAGNOSIS — Z79899 Other long term (current) drug therapy: Secondary | ICD-10-CM

## 2023-10-21 DIAGNOSIS — Z823 Family history of stroke: Secondary | ICD-10-CM

## 2023-10-21 DIAGNOSIS — R579 Shock, unspecified: Secondary | ICD-10-CM | POA: Insufficient documentation

## 2023-10-21 DIAGNOSIS — G931 Anoxic brain damage, not elsewhere classified: Secondary | ICD-10-CM | POA: Diagnosis present

## 2023-10-21 DIAGNOSIS — I10 Essential (primary) hypertension: Secondary | ICD-10-CM | POA: Diagnosis present

## 2023-10-21 LAB — POCT I-STAT 7, (LYTES, BLD GAS, ICA,H+H)
Acid-base deficit: 5 mmol/L — ABNORMAL HIGH (ref 0.0–2.0)
Bicarbonate: 20.6 mmol/L (ref 20.0–28.0)
Calcium, Ion: 1.13 mmol/L — ABNORMAL LOW (ref 1.15–1.40)
HCT: 42 % (ref 39.0–52.0)
Hemoglobin: 14.3 g/dL (ref 13.0–17.0)
O2 Saturation: 98 %
Patient temperature: 99.8
Potassium: 3.9 mmol/L (ref 3.5–5.1)
Sodium: 136 mmol/L (ref 135–145)
TCO2: 22 mmol/L (ref 22–32)
pCO2 arterial: 39.8 mmHg (ref 32–48)
pH, Arterial: 7.325 — ABNORMAL LOW (ref 7.35–7.45)
pO2, Arterial: 122 mmHg — ABNORMAL HIGH (ref 83–108)

## 2023-10-21 LAB — COMPREHENSIVE METABOLIC PANEL WITH GFR
ALT: 21 U/L (ref 0–44)
AST: 71 U/L — ABNORMAL HIGH (ref 15–41)
Albumin: 2.2 g/dL — ABNORMAL LOW (ref 3.5–5.0)
Alkaline Phosphatase: 29 U/L — ABNORMAL LOW (ref 38–126)
Anion gap: 18 — ABNORMAL HIGH (ref 5–15)
BUN: 32 mg/dL — ABNORMAL HIGH (ref 8–23)
CO2: 22 mmol/L (ref 22–32)
Calcium: 8.6 mg/dL — ABNORMAL LOW (ref 8.9–10.3)
Chloride: 99 mmol/L (ref 98–111)
Creatinine, Ser: 7.76 mg/dL — ABNORMAL HIGH (ref 0.61–1.24)
GFR, Estimated: 7 mL/min — ABNORMAL LOW (ref >60)
Glucose, Bld: 284 mg/dL — ABNORMAL HIGH (ref 70–99)
Potassium: 4.1 mmol/L (ref 3.5–5.1)
Sodium: 139 mmol/L (ref 135–145)
Total Bilirubin: 1.2 mg/dL (ref 0.0–1.2)
Total Protein: 4.5 g/dL — ABNORMAL LOW (ref 6.5–8.1)

## 2023-10-21 LAB — CBC WITH DIFFERENTIAL/PLATELET
Abs Immature Granulocytes: 0.7 10*3/uL — ABNORMAL HIGH (ref 0.00–0.07)
Basophils Absolute: 0.1 10*3/uL (ref 0.0–0.1)
Basophils Relative: 1 %
Eosinophils Absolute: 0.1 10*3/uL (ref 0.0–0.5)
Eosinophils Relative: 2 %
HCT: 45.7 % (ref 39.0–52.0)
Hemoglobin: 13 g/dL (ref 13.0–17.0)
Immature Granulocytes: 11 %
Lymphocytes Relative: 29 %
Lymphs Abs: 2 10*3/uL (ref 0.7–4.0)
MCH: 27.1 pg (ref 26.0–34.0)
MCHC: 28.4 g/dL — ABNORMAL LOW (ref 30.0–36.0)
MCV: 95.2 fL (ref 80.0–100.0)
Monocytes Absolute: 0.6 10*3/uL (ref 0.1–1.0)
Monocytes Relative: 9 %
Neutro Abs: 3.2 10*3/uL (ref 1.7–7.7)
Neutrophils Relative %: 48 %
Platelets: 113 10*3/uL — ABNORMAL LOW (ref 150–400)
RBC: 4.8 MIL/uL (ref 4.22–5.81)
RDW: 15.1 % (ref 11.5–15.5)
WBC: 6.7 10*3/uL (ref 4.0–10.5)
nRBC: 1.3 % — ABNORMAL HIGH (ref 0.0–0.2)

## 2023-10-21 LAB — GLUCOSE, CAPILLARY
Glucose-Capillary: 104 mg/dL — ABNORMAL HIGH (ref 70–99)
Glucose-Capillary: 153 mg/dL — ABNORMAL HIGH (ref 70–99)
Glucose-Capillary: 162 mg/dL — ABNORMAL HIGH (ref 70–99)
Glucose-Capillary: 174 mg/dL — ABNORMAL HIGH (ref 70–99)
Glucose-Capillary: 31 mg/dL — CL (ref 70–99)
Glucose-Capillary: 62 mg/dL — ABNORMAL LOW (ref 70–99)
Glucose-Capillary: 64 mg/dL — ABNORMAL LOW (ref 70–99)
Glucose-Capillary: 93 mg/dL (ref 70–99)

## 2023-10-21 LAB — CBC
HCT: 41.2 % (ref 39.0–52.0)
Hemoglobin: 12.4 g/dL — ABNORMAL LOW (ref 13.0–17.0)
MCH: 27 pg (ref 26.0–34.0)
MCHC: 30.1 g/dL (ref 30.0–36.0)
MCV: 89.6 fL (ref 80.0–100.0)
Platelets: 163 10*3/uL (ref 150–400)
RBC: 4.6 MIL/uL (ref 4.22–5.81)
RDW: 15.3 % (ref 11.5–15.5)
WBC: 13.8 10*3/uL — ABNORMAL HIGH (ref 4.0–10.5)
nRBC: 0.1 % (ref 0.0–0.2)

## 2023-10-21 LAB — TROPONIN I (HIGH SENSITIVITY)
Troponin I (High Sensitivity): 152 ng/L (ref <18)
Troponin I (High Sensitivity): 2249 ng/L (ref <18)

## 2023-10-21 LAB — BLOOD GAS, ARTERIAL
Acid-base deficit: 11.6 mmol/L — ABNORMAL HIGH (ref 0.0–2.0)
Bicarbonate: 16.4 mmol/L — ABNORMAL LOW (ref 20.0–28.0)
O2 Saturation: 99.6 %
Patient temperature: 37
pCO2 arterial: 44 mmHg (ref 32–48)
pH, Arterial: 7.18 — CL (ref 7.35–7.45)
pO2, Arterial: 368 mmHg — ABNORMAL HIGH (ref 83–108)

## 2023-10-21 LAB — BASIC METABOLIC PANEL WITH GFR
Anion gap: 16 — ABNORMAL HIGH (ref 5–15)
BUN: 25 mg/dL — ABNORMAL HIGH (ref 8–23)
CO2: 23 mmol/L (ref 22–32)
Calcium: 8.7 mg/dL — ABNORMAL LOW (ref 8.9–10.3)
Chloride: 100 mmol/L (ref 98–111)
Creatinine, Ser: 7.32 mg/dL — ABNORMAL HIGH (ref 0.61–1.24)
GFR, Estimated: 8 mL/min — ABNORMAL LOW (ref >60)
Glucose, Bld: 116 mg/dL — ABNORMAL HIGH (ref 70–99)
Potassium: 3.8 mmol/L (ref 3.5–5.1)
Sodium: 139 mmol/L (ref 135–145)

## 2023-10-21 LAB — CBG MONITORING, ED: Glucose-Capillary: 88 mg/dL (ref 70–99)

## 2023-10-21 LAB — HEPARIN LEVEL (UNFRACTIONATED): Heparin Unfractionated: 0.98 [IU]/mL — ABNORMAL HIGH (ref 0.30–0.70)

## 2023-10-21 LAB — MRSA NEXT GEN BY PCR, NASAL: MRSA by PCR Next Gen: NOT DETECTED

## 2023-10-21 LAB — MAGNESIUM
Magnesium: 2.4 mg/dL (ref 1.7–2.4)
Magnesium: 2.4 mg/dL (ref 1.7–2.4)

## 2023-10-21 LAB — APTT: aPTT: 36 s (ref 24–36)

## 2023-10-21 LAB — LACTIC ACID, PLASMA: Lactic Acid, Venous: 3.8 mmol/L (ref 0.5–1.9)

## 2023-10-21 MED ORDER — NOREPINEPHRINE 4 MG/250ML-% IV SOLN
INTRAVENOUS | Status: AC
  Administered 2023-10-21: 10 mg
  Filled 2023-10-21: qty 250

## 2023-10-21 MED ORDER — POLYETHYLENE GLYCOL 3350 17 G PO PACK
17.0000 g | PACK | Freq: Every day | ORAL | Status: DC
  Administered 2023-10-22: 17 g
  Filled 2023-10-21: qty 1

## 2023-10-21 MED ORDER — ORAL CARE MOUTH RINSE
15.0000 mL | OROMUCOSAL | Status: DC
  Administered 2023-10-21 – 2023-10-23 (×22): 15 mL via OROMUCOSAL

## 2023-10-21 MED ORDER — DEXTROSE 50 % IV SOLN: 12.5000 g | INTRAVENOUS | Status: AC

## 2023-10-21 MED ORDER — HEPARIN (PORCINE) 25000 UT/250ML-% IV SOLN
1800.0000 [IU]/h | INTRAVENOUS | Status: DC
  Administered 2023-10-21: 1300 [IU]/h via INTRAVENOUS
  Administered 2023-10-22: 1800 [IU]/h via INTRAVENOUS
  Filled 2023-10-21 (×3): qty 250

## 2023-10-21 MED ORDER — SODIUM CHLORIDE 0.9 % IV BOLUS
500.0000 mL | Freq: Once | INTRAVENOUS | Status: AC
  Administered 2023-10-21: 500 mL via INTRAVENOUS

## 2023-10-21 MED ORDER — ORAL CARE MOUTH RINSE: 15.0000 mL | OROMUCOSAL | Status: DC | PRN

## 2023-10-21 MED ORDER — MIDAZOLAM HCL 5 MG/5ML IJ SOLN
5.0000 mg | Freq: Once | INTRAMUSCULAR | Status: AC
  Administered 2023-10-21: 5 mg via INTRAVENOUS
  Filled 2023-10-21: qty 5

## 2023-10-21 MED ORDER — CHLORHEXIDINE GLUCONATE CLOTH 2 % EX PADS
6.0000 | MEDICATED_PAD | Freq: Every day | CUTANEOUS | Status: DC
  Administered 2023-10-21 – 2023-10-22 (×2): 6 via TOPICAL

## 2023-10-21 MED ORDER — EPINEPHRINE HCL 5 MG/250ML IV SOLN IN NS
INTRAVENOUS | Status: AC
  Administered 2023-10-21: 20 ug/min via INTRAVENOUS
  Filled 2023-10-21: qty 250

## 2023-10-21 MED ORDER — NOREPINEPHRINE 16 MG/250ML-% IV SOLN
0.0000 ug/min | INTRAVENOUS | Status: DC
  Administered 2023-10-21: 30 ug/min via INTRAVENOUS
  Administered 2023-10-21: 14 ug/min via INTRAVENOUS
  Administered 2023-10-22: 38 ug/min via INTRAVENOUS
  Administered 2023-10-22: 31 ug/min via INTRAVENOUS
  Administered 2023-10-22: 39 ug/min via INTRAVENOUS
  Administered 2023-10-23: 35 ug/min via INTRAVENOUS
  Administered 2023-10-23: 40 ug/min via INTRAVENOUS
  Filled 2023-10-21 (×7): qty 250

## 2023-10-21 MED ORDER — FAMOTIDINE 20 MG PO TABS
10.0000 mg | ORAL_TABLET | Freq: Every day | ORAL | Status: DC
  Administered 2023-10-22: 10 mg
  Filled 2023-10-21: qty 1

## 2023-10-21 MED ORDER — VASOPRESSIN 20 UNITS/100 ML INFUSION FOR SHOCK
0.0000 [IU]/min | INTRAVENOUS | Status: DC
  Administered 2023-10-21 – 2023-10-23 (×6): 0.03 [IU]/min via INTRAVENOUS
  Filled 2023-10-21 (×5): qty 100

## 2023-10-21 MED ORDER — FENTANYL 2500MCG IN NS 250ML (10MCG/ML) PREMIX INFUSION
0.0000 ug/h | INTRAVENOUS | Status: DC
  Administered 2023-10-21: 25 ug/h via INTRAVENOUS
  Filled 2023-10-21: qty 250

## 2023-10-21 MED ORDER — DEXTROSE 50 % IV SOLN
12.5000 g | INTRAVENOUS | Status: AC
  Administered 2023-10-21: 12.5 g via INTRAVENOUS

## 2023-10-21 MED ORDER — DOCUSATE SODIUM 50 MG/5ML PO LIQD
100.0000 mg | Freq: Two times a day (BID) | ORAL | Status: DC
  Administered 2023-10-21 – 2023-10-22 (×3): 100 mg
  Filled 2023-10-21 (×3): qty 10

## 2023-10-21 MED ORDER — DEXTROSE 50 % IV SOLN
INTRAVENOUS | Status: AC
  Administered 2023-10-21: 25 g via INTRAVENOUS
  Filled 2023-10-21: qty 50

## 2023-10-21 MED ORDER — DEXTROSE 50 % IV SOLN: 25.0000 g | INTRAVENOUS | Status: AC

## 2023-10-21 MED ORDER — HEPARIN BOLUS VIA INFUSION
2000.0000 [IU] | Freq: Once | INTRAVENOUS | Status: AC
  Administered 2023-10-21: 2000 [IU] via INTRAVENOUS
  Filled 2023-10-21: qty 2000

## 2023-10-21 MED ORDER — VASOPRESSIN 20 UNITS/100 ML INFUSION FOR SHOCK
INTRAVENOUS | Status: AC
  Filled 2023-10-21: qty 100

## 2023-10-21 MED ORDER — MIDAZOLAM-SODIUM CHLORIDE 100-0.9 MG/100ML-% IV SOLN
0.5000 mg/h | INTRAVENOUS | Status: DC
  Administered 2023-10-21: 0.5 mg/h via INTRAVENOUS
  Filled 2023-10-21: qty 100

## 2023-10-21 MED ORDER — DEXTROSE 50 % IV SOLN
INTRAVENOUS | Status: AC
  Administered 2023-10-21: 12.5 g via INTRAVENOUS
  Filled 2023-10-21: qty 50

## 2023-10-21 MED ORDER — EPINEPHRINE HCL 5 MG/250ML IV SOLN IN NS
0.5000 ug/min | INTRAVENOUS | Status: DC
  Administered 2023-10-21 (×2): 20 ug/min via INTRAVENOUS
  Administered 2023-10-21: 15 ug/min via INTRAVENOUS
  Administered 2023-10-23: 20 ug/min via INTRAVENOUS
  Administered 2023-10-23 (×2): 40 ug/min via INTRAVENOUS
  Filled 2023-10-21 (×7): qty 250

## 2023-10-21 MED ORDER — DEXTROSE 10 % IV SOLN: INTRAVENOUS | Status: DC

## 2023-10-21 NOTE — Progress Notes (Signed)
 Transported patient to and from CT without incident.  Had to placed bite block earlier due to patient clamping down on tube.

## 2023-10-21 NOTE — Plan of Care (Signed)

## 2023-10-21 NOTE — ED Notes (Signed)
 Bair hugger turned off- temp 98.7

## 2023-10-21 NOTE — H&P (Addendum)
 NAME:  Michael [name]., [name]:  [id number], DOB:  Aug 31, 1955, LOS: 0 ADMISSION DATE:  10/21/2023, CONSULTATION DATE:  10/21/2023 REFERRING MD:  EDP at [facility], CHIEF COMPLAINT:  Cardiac Arrest   History of Present Illness:  This is a 68 year old gentleman with a history of end-stage renal disease on dialysis who presents from out-of-hospital cardiac arrest.  History is obtained from chart review and discussion with bedside providers.  Patient went unresponsive witnessed by wife at home.  She started CPR within about 10 minutes.  He has had over an hour of prolonged resuscitative efforts.  Rhythms have varied from PEA to V. tach to V-fib.  He is also anticoagulated on apixaban .  His blood sugars are low refractory to dextrose  administration.  He was intubated in the ED at [facility] and transferred to [facility], ICU for further care.  He is now on norepinephrine , epinephrine  and vasopressin  infusions.  He is sedated with fentanyl  and Versed .  Pertinent  Medical History  ESRD on HD Atrial fibs/flutter on Eliquis  Chronic HFrEF EF less than 20%   Significant Hospital Events: Including procedures, antibiotic start and stop dates in addition to other pertinent events     Interim History / Subjective:    Objective    Blood pressure (!) 83/68, pulse 69, temperature 98.7 F (37.1 C), resp. rate 17, height 5' 7 (1.702 m), SpO2 91%.    Vent Mode: PRVC FiO2 (%):  [45 %-100 %] 60 % Set Rate:  [24 bmp] 24 bmp Vt Set:  [530 mL] 530 mL PEEP:  [5 cmH20] 5 cmH20 Plateau Pressure:  [29 cmH20-36 cmH20] 29 cmH20  No intake or output data in the 24 hours ending 10/21/23 1052 There were no vitals filed for this visit.  Examination: Gen:      Intubated, sedated, acutely ill appearing HEENT:  ETT to vent Lungs:    sounds of mechanical ventilation auscultated no wheeze CV:         Currently regular Abd:      + bowel sounds; soft, non-tender; no palpable masses, no distension Ext:    Bilateral  lower extremity pitting edema GU:       Scrotal enlargement with edema, nonreducible inguinal hernia Skin:      Warm and dry; no rashes Neuro:   Sedated, not responsive, pupils miotic, positive cough and gag  Labs reviewed, troponin elevated consistent with cardiac arrest EKG reviewed shows an irregular rhythm with irregular rate.  Differential includes slow A-fib with aberrancy, junctional rhythm.   Resolved problem list   Assessment and Plan   Out-of-hospital cardiac arrest with prolonged gestation greater than 1 hour Cardiogenic shock Chronic HFrEF EF<20% - Unclear etiology for the arrest but he is clearly very chronically ill - Monitor electrolytes goal K over 4 mag over 2 - EKG obtained was irregular but he is now regular on telemetry. - Currently on 3 vasopressor support for goal systolic over 90  Concern for Anoxic Brain Injury - hold all sedation, monitor response - consider EEG, MRI in future 72 hours after arrest if he is still alive  Refractory hypoglycemia - Start dextrose  infusion  End stage renal disease on HD -Unable to tolerate HD or CRRT at this point given his multi vasopressor shock -Monitor Bmet - nephrology consultation  Atrial fib/flutter - hold eliquis  - start heparin  gtt   Best Practice (right click and Reselect all SmartList Selections daily)   Diet/type: NPO DVT prophylaxis systemic heparin  Pressure ulcer(s): pressure ulcer assessment  deferred  GI prophylaxis: H2B Lines: Central line Foley:  Yes, and it is still needed Code Status:  full code Last date of multidisciplinary goals of care discussion maurene Ona called 1:41 pm, no answer, left message for callback.]  Labs   CBC: Recent Labs  Lab 10/21/23 0213  WBC 6.7  NEUTROABS 3.2  HGB 13.0  HCT 45.7  MCV 95.2  PLT 113*    Basic Metabolic Panel: Recent Labs  Lab 10/21/23 0213  NA 139  K 3.8  CL 100  CO2 23  GLUCOSE 116*  BUN 25*  CREATININE 7.32*  CALCIUM  8.7*  MG 2.4    GFR: CrCl cannot be calculated (Unknown ideal weight.). Recent Labs  Lab 10/21/23 0213  WBC 6.7    Liver Function Tests: No results for input(s): AST, ALT, ALKPHOS, BILITOT, PROT, ALBUMIN in the last 168 hours. No results for input(s): LIPASE, AMYLASE in the last 168 hours. No results for input(s): AMMONIA in the last 168 hours.  ABG    Component Value Date/Time   PHART 7.18 (LL) 10/21/2023 0254   PCO2ART 44 10/21/2023 0254   PO2ART 368 (H) 10/21/2023 0254   HCO3 16.4 (L) 10/21/2023 0254   TCO2 27 08/11/2021 1607   ACIDBASEDEF 11.6 (H) 10/21/2023 0254   O2SAT 99.6 10/21/2023 0254     Coagulation Profile: No results for input(s): INR, PROTIME in the last 168 hours.  Cardiac Enzymes: No results for input(s): CKTOTAL, CKMB, CKMBINDEX, TROPONINI in the last 168 hours.  HbA1C: Hgb A1c MFr Bld  Date/Time Value Ref Range Status  08/13/2021 02:12 AM 4.8 4.8 - 5.6 % Final    Comment:    (NOTE) Pre diabetes:          5.7%-6.4%  Diabetes:              >6.4%  Glycemic control for   <7.0% adults with diabetes     CBG: Recent Labs  Lab 10/21/23 0201 10/21/23 1011 10/21/23 1030 10/21/23 1046  GLUCAP 88 31* 62* 64*    Review of Systems:   Unable to obtain due to patient condition  Past Medical History:  He,  has a past medical history of Atrial arrhythmia, Cataract, Dyspnea, ESRD (end stage renal disease) (HCC), Fatigue, History of kidney stones, Hyperlipidemia, Hyperparathyroidism, secondary renal (HCC), Hypertension, Hypoxemia (12/12/2013), Nonischemic cardiomyopathy (HCC), OSA on CPAP, OSA on CPAP (03/24/2014), Pneumonia, Prostate cancer (HCC), Ventricular tachycardia//Freq PVCs, and Wears glasses.   Surgical History:   Past Surgical History:  Procedure Laterality Date   A-FLUTTER ABLATION N/A 04/11/2019   Procedure: A-FLUTTER ABLATION;  Surgeon: Waddell Danelle ORN, MD;  Location: [facility] INVASIVE CV LAB;  Service: Cardiovascular;   Laterality: N/A;   A/V FISTULAGRAM Left 04/27/2017   Procedure: A/V FISTULAGRAM;  Surgeon: Laurence Redell CROME, MD;  Location: [facility] INVASIVE CV LAB;  Service: Cardiovascular;  Laterality: Left;  lt arm   A/V FISTULAGRAM Left 01/10/2019   Procedure: A/V FISTULAGRAM;  Surgeon: Gretta Lonni PARAS, MD;  Location: [facility] INVASIVE CV LAB;  Service: Cardiovascular;  Laterality: Left;   APPENDECTOMY     AV FISTULA PLACEMENT  12/05/2011   Procedure: ARTERIOVENOUS (AV) FISTULA CREATION;LLEFT ARM  Surgeon: Redell CROME Laurence, MD;  Location[facility] OR;  Service: Vascular;  Laterality: Left;  RADIO-CEPHALIC  fistula left arm   AV FISTULA PLACEMENT  01/11/2012   Procedure: ARTERIOVENOUS (AV) FISTULA CREATION;  Surgeon: Redell CROME Laurence, MD;  Location: [facility] OR;  Service: Vascular;  Laterality: Left;  Creation of left brachial  cephalic arteriovenous fistula   AV FISTULA PLACEMENT Right 03/07/2019   Procedure: ARTERIOVENOUS (AV) FISTULA CREATION  RIGHT ARM;  Surgeon: Gretta Lonni PARAS, MD;  Location: [facility] OR;  Service: Vascular;  Laterality: Right;   BASCILIC VEIN TRANSPOSITION Left 12/27/2016   Procedure: BASILIC VEIN TRANSPOSITION LEFT UPPER ARM FIRST STAGE;  Surgeon: Laurence Redell CROME, MD;  Location: [facility] OR;  Service: Vascular;  Laterality: Left;   BASCILIC VEIN TRANSPOSITION Left 01/31/2017   Procedure: LEFT ARM BASILIC VEIN TRANSPOSITION, SECOND STAGE;  Surgeon: Laurence Redell CROME, MD;  Location: [facility] OR;  Service: Vascular;  Laterality: Left;   CARDIAC CATHETERIZATION  04-05-2010   checking for blockage but none-[facility]   COLONOSCOPY     CYSTOSCOPY W/ STONE MANIPULATION     laser once (01/22/2013)   DIALYSIS/PERMA CATHETER INSERTION N/A 05/08/2023   Procedure: DIALYSIS/PERMA CATHETER INSERTION;  Surgeon: Norine Manuelita LABOR, MD;  Location: [facility] INVASIVE CV LAB;  Service: Cardiovascular;  Laterality: N/A;   DIALYSIS/PERMA CATHETER REMOVAL N/A 05/08/2023   Procedure: DIALYSIS/PERMA CATHETER REMOVAL;  Surgeon: Norine Manuelita LABOR, MD;  Location: [facility] INVASIVE CV  LAB;  Service: Cardiovascular;  Laterality: N/A;   HEMATOMA EVACUATION Left 05/09/2017   Procedure: EVACUATION HEMATOMA LEFT ARM;  Surgeon: Laurence Redell CROME, MD;  Location: [facility] OR;  Service: Vascular;  Laterality: Left;   HERNIA REPAIR     INGUINAL HERNIA REPAIR Right 11/06/2015   Procedure: OPEN HERNIA REPAIR  RIGHT INGUINAL ADULT;  Surgeon: Donnice Lunger, MD;  Location: [facility] ORS;  Service: General;  Laterality: Right;  with MESH   INSERTION OF DIALYSIS CATHETER Right 10/05/2016   Procedure: INSERTION OF right internal jugular DIALYSIS CATHETER;  Surgeon: Oris Krystal FALCON, MD;  Location: [facility] OR;  Service: Vascular;  Laterality: Right;   INSERTION OF MESH  03/20/2012   Procedure: INSERTION OF MESH;  JADE Surgeon: Donnice Bury, MD;  Location: [facility] OR;  Service: General;  Laterality: N/A;   INSERTION OF MESH N/A 01/22/2013   Procedure: INSERTION OF MESH;  Surgeon: Donnice Bury, MD;  Location: [facility] OR;  Service: General;  Laterality: N/A;   IR FLUORO GUIDE CV LINE RIGHT  06/06/2023   IR US  GUIDE VASC ACCESS RIGHT  06/06/2023   LAPAROTOMY  04/02/2012   Procedure: EXPLORATORY LAPAROTOMY;  Surgeon: Donnice Bury, MD;  Location: [facility] OR;  Service: General;  Laterality: N/A;  Exploratory Laparotomy with resection of small intestine   LEFT HEART CATHETERIZATION WITH CORONARY ANGIOGRAM N/A 05/14/2013   Procedure: LEFT HEART CATHETERIZATION WITH CORONARY ANGIOGRAM;  Surgeon: Victory LELON Claudene DOUGLAS, MD;  Location: [facility] CATH LAB;  Service: Cardiovascular;  Laterality: N/A;   LIGATION OF ARTERIOVENOUS  FISTULA Left 12/27/2016   Procedure: LIGATION/EXCISION OF LEFT UPPER ARM ARTERIOVENOUS  FISTULA;  EVACUATION OF HEMATOMA;  Surgeon: Laurence Redell CROME, MD;  Location: [facility] OR;  Service: Vascular;  Laterality: Left;   LIGATION OF ARTERIOVENOUS  FISTULA Left 03/07/2019   Procedure: LIGATION OF ARTERIOVENOUS FISTULA  LEFT UPPER ARM;  Surgeon: Gretta Lonni PARAS, MD;  Location: [facility] OR;  Service: Vascular;  Laterality: Left;   PERIPHERAL  VASCULAR BALLOON ANGIOPLASTY  05/08/2023   Procedure: PERIPHERAL VASCULAR BALLOON ANGIOPLASTY;  Surgeon: Norine Manuelita LABOR, MD;  Location: [facility] INVASIVE CV LAB;  Service: Cardiovascular;;  innominate/ SVC   REVISON OF ARTERIOVENOUS FISTULA Left 10/05/2016   Procedure: REVISON OF left arm ARTERIOVENOUS FISTULA;  Surgeon: Oris Krystal FALCON, MD;  Location: [facility] OR;  Service: Vascular;  Laterality: Left;   TONSILLECTOMY     UMBILICAL HERNIA  REPAIR  03/20/2012   Procedure: HERNIA REPAIR UMBILICAL ADULT;  Surgeon: Donnice Bury, MD;  Location: [facility] OR;  Service: General;  Laterality: N/A;   UMBILICAL HERNIA REPAIR  01/22/2013   preperitoneal open procedure due to significant adhesions/notes 01/22/2013   VENTRAL HERNIA REPAIR N/A 01/22/2013   Procedure: ATTEMPTED LAPAROSCOPIC VENTRAL HERNIA CONVERTED TO OPEN;  Surgeon: Donnice Bury, MD;  Location: [facility] OR;  Service: General;  Laterality: N/A;     Social History:   reports that he has never smoked. He has never used smokeless tobacco. He reports that he does not drink alcohol and does not use drugs.   Family History:  His family history includes Heart disease in his mother; Hyperlipidemia in his mother; Hypertension in his mother; Kidney disease in his brother and father; Stroke in his father. There is no history of Amblyopia, Blindness, Cataracts, Diabetes, Glaucoma, Macular degeneration, Retinal detachment, Strabismus, Retinitis pigmentosa, Breast cancer, Prostate cancer, Colon cancer, or Pancreatic cancer.   Allergies Allergies  Allergen Reactions   Colchicine Other (See Comments)    Other reaction(s): foot pain   Indapamide Other (See Comments)    unknown   Lipitor [Atorvastatin ] Itching    Leg pain   Viagra [Sildenafil] Other (See Comments)    Feels weird - Levitra. - headache   Zyloprim [Allopurinol] Other (See Comments)    foot pain     Home Medications  Prior to Admission medications   Medication Sig Start Date End Date Taking? Authorizing  Provider  apixaban  (ELIQUIS ) 5 MG TABS tablet Take 1 tablet by mouth twice daily 06/27/22   Waddell Danelle ORN, MD  brimonidine  (ALPHAGAN ) 0.2 % ophthalmic solution Place 1 drop into both eyes 3 (three) times daily. 03/20/23   [provider]  calcitRIOL  (ROCALTROL ) 0.5 MCG capsule Take 3 capsules (1.5 mcg total) by mouth every Tuesday, Thursday, and Saturday at 6 PM. 06/08/23   Cherlyn Labella, MD  cinacalcet  (SENSIPAR ) 30 MG tablet Take 30 mg by mouth daily. 10/05/20   [provider]  metoprolol  succinate (TOPROL -XL) 25 MG 24 hr tablet TAKE 1 TABLET BY MOUTH AT BEDTIME 02/22/23   Thukkani, Arun K, MD  nitroGLYCERIN  (NITROSTAT ) 0.4 MG SL tablet Place 1 tablet (0.4 mg total) under the tongue every 5 (five) minutes x 3 doses as needed for chest pain. 05/19/13   Jerilynn Lamarr HERO, NP  rosuvastatin  (CRESTOR ) 10 MG tablet Take 1 tablet (10 mg total) by mouth daily. 01/17/23   Thukkani, Arun K, MD  sevelamer  carbonate (RENVELA ) 800 MG tablet Take 800 mg by mouth 3 (three) times daily with meals.    [provider]     Critical care time:     The patient is critically ill due to shock, cardiac arrest.  Critical care was necessary to treat or prevent imminent or life-threatening deterioration.  Critical care was time spent personally by me on the following activities: development of treatment plan with patient and/or surrogate as well as nursing, discussions with consultants, evaluation of patient's response to treatment, examination of patient, obtaining history from patient or surrogate, ordering and performing treatments and interventions, ordering and review of laboratory studies, ordering and review of radiographic studies, pulse oximetry, re-evaluation of patient's condition and participation in multidisciplinary rounds.   Critical Care Time devoted to patient care services described in this note is 65 minutes. This time reflects time of care of this signee Cederick Broadnax S Mariajose Mow . This  critical care time does not reflect separately billable procedures or procedure  time, teaching time or supervisory time of PA/NP/Med student/Med Resident etc but could involve care discussion time.       [name] [facility] 10/21/2023 11:01 AM  Pager: see AMION  If no response to pager , please call critical care on call (see AMION) until 7pm After 7:00 pm call Elink

## 2023-10-21 NOTE — ED Provider Notes (Signed)
 Patient currently on 20 mcg/min of epinephrine .  Notified by the nurses that he is still hypotensive.  Starting vasopressin  at this time.  Will also give him 500 cc bolus of fluids since he does have esrd on dialysis.  Unfortunately blood pressure did not improve.  Required Levophed  to be started.  Left IJ central line was placed.  Confirmed by ultrasound and is ready for use  Central Line  Date/Time: 10/21/2023 9:53 AM  Performed by: Yolande Lamar BROCKS, MD Authorized by: Yolande Lamar BROCKS, MD   Consent:    Consent obtained:  Emergent situation Universal protocol:    Patient identity confirmed:  Arm band Pre-procedure details:    Indication(s): central venous access and insufficient peripheral access     Hand hygiene: Hand hygiene performed prior to insertion     Sterile barrier technique: All elements of maximal sterile technique followed     Skin preparation:  Chlorhexidine  Sedation:    Sedation type:  Deep Procedure details:    Location:  L internal jugular   Patient position:  Reverse Trendelenburg   Procedural supplies:  Triple lumen   Catheter size:  7 Fr   Ultrasound guidance: yes     Ultrasound guidance timing: real time     Sterile ultrasound techniques: Sterile gel and sterile probe covers were used     Number of attempts:  1   Successful placement: yes   Post-procedure details:    Post-procedure:  Dressing applied and line sutured   Assessment:  Blood return through all ports, no pneumothorax on x-ray, free fluid flow and placement verified by x-ray   Procedure completion:  Tolerated well, no immediate complications  CRITICAL CARE Performed by: Lamar BROCKS Yolande   Total critical care time: 30 minutes  Critical care time was exclusive of separately billable procedures and treating other patients.  Critical care was necessary to treat or prevent imminent or life-threatening deterioration.  Critical care was time spent personally by me on the following activities:  development of treatment plan with patient and/or surrogate as well as nursing, discussions with consultants, evaluation of patient's response to treatment, examination of patient, obtaining history from patient or surrogate, ordering and performing treatments and interventions, ordering and review of laboratory studies, ordering and review of radiographic studies, pulse oximetry and re-evaluation of patient's condition.    Yolande Lamar BROCKS, MD 10/21/23 [phone]

## 2023-10-21 NOTE — IPAL (Signed)
  Interdisciplinary Goals of Care Family Meeting   Date carried out: 10/21/2023  Location of the meeting: Bedside  Member's involved: Physician, Bedside Registered Nurse, and Family Member or next of kin  Durable Power of Attorney or acting medical decision maker: Michael [name], wife, stepdaughter present at bedside.     Discussion: We discussed goals of care for Michael [name]. .  Reviewed his prolonged resuscitation. She expresses concern for lack of oxygen to his brain. Does not want him to undergo chest compressions. Most of the patient's family is in [place]  including the patient's sister who is a Engineer, civil (consulting).  She just wants a little time to process everything, and does not want him to suffer. Discussed having family do virtual visits with EICU, and facetime. Discussed potential for family conference tomorrow over the phone if desired, and potential plans to transition to comfort measures tomorrow. Would not escalate care overnight and if clinical decline would notify family and transition to comfort measures.   Code status:   Code Status: Do not attempt resuscitation (DNR) PRE-ARREST INTERVENTIONS DESIRED   Disposition: Continue current acute care  Time spent for the meeting: 15 minutes.     [name], MD  10/21/2023, 5:07 PM

## 2023-10-21 NOTE — ED Notes (Signed)
 Vasopressin  titrated above ordered dose to .04 by [facility] RN via verbal order by Lamar Gander, MD

## 2023-10-21 NOTE — ED Notes (Signed)
Bair Hugger placed.

## 2023-10-21 NOTE — ED Notes (Signed)
 Patient had large BM. Linens changed and patient cleaned up.

## 2023-10-21 NOTE — ED Notes (Signed)
 Confirmed use approved of central line and OG tube by Sherran Barks, PA. [facility] notified.

## 2023-10-21 NOTE — Progress Notes (Signed)
 PHARMACY - ANTICOAGULATION CONSULT NOTE  Pharmacy Consult for heparin  Indication: atrial fibrillation  Allergies  Allergen Reactions   Colchicine Other (See Comments)    Other reaction(s): foot pain   Indapamide Other (See Comments)    unknown   Lipitor [Atorvastatin ] Itching    Leg pain   Viagra [Sildenafil] Other (See Comments)    Feels weird - Levitra. - headache   Zyloprim [Allopurinol] Other (See Comments)    foot pain    Patient Measurements: Height: 5' 7 (170.2 cm) Weight: 89.3 kg (196 lb 13.9 oz) IBW/kg (Calculated) : 66.1 HEPARIN  DW (KG): 84.6  Vital Signs: Temp: 98.8 F (37.1 C) (06/21 1100) Temp Source: Axillary (06/21 1100) BP: 83/68 (06/21 0900) Pulse Rate: 69 (06/21 0900)  Labs: Recent Labs    10/21/23 0213 10/21/23 0442  HGB 13.0  --   HCT 45.7  --   PLT 113*  --   CREATININE 7.32*  --   TROPONINIHS 152* 2,249*    Estimated Creatinine Clearance: 10.6 mL/min (A) (by C-G formula based on SCr of 7.32 mg/dL (H)).   Medical History: Past Medical History:  Diagnosis Date   Atrial arrhythmia    atrial tachycardia with variable AV conduction versus atypical aflutter 01/10/19, rate control (02/06/19)   Cataract    Dyspnea    on exertion   ESRD (end stage renal disease) (HCC)    Hemo- MWF, Polycystic kidney disease   Fatigue    History of kidney stones    removal of stone- cysto   Hyperlipidemia    Hyperparathyroidism, secondary renal (HCC)    Hypertension    Hypoxemia 12/12/2013   Nonischemic cardiomyopathy (HCC)    Er 25% 2015, 55 % 2013   OSA on CPAP    no longer using cpap   OSA on CPAP 03/24/2014   Pneumonia    2015ish   Prostate cancer [facility])    Ventricular tachycardia//Freq PVCs    Wears glasses     Assessment: Michael [name] presenting for OOH arrest (PEA > VT > VF), transfer in from [facility]. History of aflutter on Eliquis  prior to admission and ESRD on chronic HD. Pharmacy consulted for initiation of heparin  infusion while critically ill.  Unknown last dose of Eliquis  timing. CBC okay, no known bleeding at this time.  Goal of Therapy:  Heparin  level 0.3-0.7 units/ml aPTT 66-102 seconds Monitor platelets by anticoagulation protocol: Yes   Plan:  Start heparin  infusion at 1300 units/hr Check anti-Xa and aPTT level in 8 hours and daily while on heparin  HL only once correlating Continue to monitor H&H and platelets  Randall Call, PharmD, BCPS 10/21/23 11:22 AM

## 2023-10-21 NOTE — ED Notes (Signed)
 Patient transported to CT

## 2023-10-21 NOTE — ED Notes (Signed)
 Family at bedside.

## 2023-10-21 NOTE — Progress Notes (Signed)
 PHARMACY - ANTICOAGULATION CONSULT NOTE  Pharmacy Consult for heparin  Indication: atrial fibrillation  Allergies  Allergen Reactions   Colchicine Other (See Comments)    Other reaction(s): foot pain   Lipitor [Atorvastatin ] Itching and Other (See Comments)    Myalgia   Viagra [Sildenafil] Other (See Comments)    Headache, fatigue, dizziness   Zyloprim [Allopurinol] Other (See Comments)    Other reaction(s): foot pain   Indapamide Other (See Comments)    Reaction type/severity unknown    Patient Measurements: Height: 5' 7 (170.2 cm) Weight: 89.3 kg (196 lb 13.9 oz) IBW/kg (Calculated) : 66.1 HEPARIN  DW (KG): 84.6  Vital Signs: Temp: 99 F (37.2 C) (06/21 1954) Temp Source: Axillary (06/21 1954) BP: 100/71 (06/21 2000) Pulse Rate: 86 (06/21 2005)  Labs: Recent Labs    10/21/23 0213 10/21/23 0442 10/21/23 1105 10/21/23 1538 10/21/23 2014  HGB 13.0  --  12.4* 14.3  --   HCT 45.7  --  41.2 42.0  --   PLT 113*  --  163  --   --   APTT  --   --   --   --  36  HEPARINUNFRC  --   --   --   --  0.98*  CREATININE 7.32*  --  7.76*  --   --   TROPONINIHS 152* 2,249*  --   --   --     Estimated Creatinine Clearance: 10 mL/min (A) (by C-G formula based on SCr of 7.76 mg/dL (H)).   Medical History: Past Medical History:  Diagnosis Date   Atrial arrhythmia    atrial tachycardia with variable AV conduction versus atypical aflutter 01/10/19, rate control (02/06/19)   Cataract    Dyspnea    on exertion   ESRD (end stage renal disease) (HCC)    Hemo- MWF, Polycystic kidney disease   Fatigue    History of kidney stones    removal of stone- cysto   Hyperlipidemia    Hyperparathyroidism, secondary renal (HCC)    Hypertension    Hypoxemia 12/12/2013   Nonischemic cardiomyopathy (HCC)    Er 25% 2015, 55 % 2013   OSA on CPAP    no longer using cpap   OSA on CPAP 03/24/2014   Pneumonia    2015ish   Prostate cancer [facility])    Ventricular tachycardia//Freq PVCs    Wears  glasses     Assessment: 68yoM presenting for OOH arrest (PEA > VT > VF), transfer in from [facility]. History of aflutter on Eliquis  prior to admission and ESRD on chronic HD. Pharmacy consulted for initiation of heparin  infusion while critically ill. Unknown last dose of Eliquis  timing. CBC okay, no known bleeding at this time.  PM: aPTT subtherapeutic at 36 sec; HL elevated 0.98 with DOAC use. No issues with the heparin  drip running continuously, no pauses/interruptions per RN. No overt bleeding reported.  Goal of Therapy:  Heparin  level 0.3-0.7 units/ml aPTT 66-102 seconds Monitor platelets by anticoagulation protocol: Yes   Plan:  Heparin  2000 units IV bolus Increase heparin  infusion to 1500 units/hr Check anti-Xa and aPTT level in 8 hours and daily while on heparin  HL only once correlating Continue to monitor H&H and platelets  Rocky Slade, PharmD, BCPS 10/21/23 9:43 PM  Please check AMION for all [facility] phone numbers After 10:00 PM, call Main Pharmacy [phone]

## 2023-10-21 NOTE — ED Notes (Signed)
 Unable to get OG after multiple attempts.

## 2023-10-21 NOTE — ED Notes (Signed)
 Wife has patient wedding ring. No other belongings with patient - boxers and t shirt was cut off soon after arrival.

## 2023-10-21 NOTE — ED Triage Notes (Signed)
 Pt bib [facility] after they were initially called out to pt's house for possible stroke. EMS reports that upon their arrival pt had no pulse and wife was administering CPR. EMS reports that CPR was administered in the field for 1 hr and 10 mins and that pt had shockable rhythm (V-fib) 3 times with ROSC x 3. EMS also reports pt was given 8 EPI's, 1 Sodium Bicarb, and 1 Calcium  Chloride, as well as started on an Epinephrine  drip pta to ER. Upon arrival to ER, pt was found to have no pulses and CPR was restarted via LUCAS. Epinephrine  drip remained infusing through I/O access that pt came in with. King airway was removed and pt was intubated at 0200. 1 amp of Epi was given and cbg was checked revealing blood glucose of 88. Pulse was checked again at 0210 and pt was found to have a pulse. Pt was administered 1gm Calcium  Chloride at 0213. At 0216, pt was noted to be in [place]ors[place]. 2gms of  Magnesium were ordered and while this was being prepared, 1 amp of Epinephrine  was administered. At 0220, 2 gms of Magnesium was given. Pt placed on ventilator by RT at 0229.

## 2023-10-21 NOTE — ED Notes (Signed)
 Unable to get accurate pulse ox readings. Multiple probes, wires, body locations tried.  As well as attempts form multiple staff members.

## 2023-10-21 NOTE — Procedures (Signed)
 Arterial Catheter Insertion Procedure Note  Augie Vane  [id number]  November 06, 1955  Date:10/21/23  Time:3:34 PM    Provider Performing: [name]    Procedure: Insertion of Arterial Line (63379) with US  guidance (23062)   Indication(s) Blood pressure monitoring and/or need for frequent ABGs  Consent Unable to obtain consent due to emergent nature of procedure.  Anesthesia None   Time Out Verified patient identification, verified procedure, site/side was marked, verified correct patient position, special equipment/implants available, medications/allergies/relevant history reviewed, required imaging and test results available.   Sterile Technique Maximal sterile technique including full sterile barrier drape, hand hygiene, sterile gown, sterile gloves, mask, hair covering, sterile ultrasound probe cover (if used).   Procedure Description Area of catheter insertion was cleaned with chlorhexidine  and draped in sterile fashion. With real-time ultrasound guidance an arterial catheter was placed into the right femoral artery.  Appropriate arterial tracings confirmed on monitor.     Complications/Tolerance None; patient tolerated the procedure well. The left femoral artery was stuck but was unable to cannulate. Pressure held.   EBL Minimal   Specimen(s) None

## 2023-10-21 NOTE — ED Notes (Signed)
 OG tube placed at bedside. 68 at lip

## 2023-10-21 NOTE — ED Provider Notes (Signed)
 [facility] EMERGENCY DEPARTMENT AT [facility] Provider Note   CSN: [id number] Arrival date & time: 10/21/23  9841     Patient presents with: No chief complaint on file.   Michael [name]. is a 68 y.o. male.   The history is provided by the EMS personnel. The history is limited by the condition of the patient (Patient unresponsive).  He has history of hypertension, hyperlipidemia, nonischemic cardiomyopathy, end-stage renal disease on renal dialysis and was brought in because of cardiac arrest.  EMS reports that they were called to the home for possible stroke because patient was reported to have had his eyes roll up and had a shaking episode was felt to have a facial droop.  Wife reportedly started CPR within 10 minutes.  EMS did not establish an intraosseous line and inserted a King airway.  En route, he had 1 hour and 10 minutes of resuscitation.  Rhythms have gone from pulseless electrical activity to ventricular tachycardia to ventricular fibrillation.  He has received multiple doses of epinephrine  and is on an epinephrine  drip.  He has also received calcium  and sodium bicarbonate.  On 3 separate occasions, he had return of spontaneous circulation which was then lost.  He arrived with CPR in progress.  Of note, dialysis Tuesday-Thursday-Saturday, he is due for dialysis later today.  I also note that he is anticoagulated on apixaban .   Prior to Admission medications   Medication Sig Start Date End Date Taking? Authorizing Provider  apixaban  (ELIQUIS ) 5 MG TABS tablet Take 1 tablet by mouth twice daily 06/27/22   Waddell Danelle ORN, MD  brimonidine  (ALPHAGAN ) 0.2 % ophthalmic solution Place 1 drop into both eyes 3 (three) times daily. 03/20/23   [provider]  calcitRIOL  (ROCALTROL ) 0.5 MCG capsule Take 3 capsules (1.5 mcg total) by mouth every Tuesday, Thursday, and Saturday at 6 PM. 06/08/23   Cherlyn Labella, MD  cinacalcet  (SENSIPAR ) 30 MG tablet Take 30 mg by mouth daily.  10/05/20   [provider]  metoprolol  succinate (TOPROL -XL) 25 MG 24 hr tablet TAKE 1 TABLET BY MOUTH AT BEDTIME 02/22/23   Thukkani, Arun K, MD  nitroGLYCERIN  (NITROSTAT ) 0.4 MG SL tablet Place 1 tablet (0.4 mg total) under the tongue every 5 (five) minutes x 3 doses as needed for chest pain. 05/19/13   Jerilynn Lamarr HERO, NP  rosuvastatin  (CRESTOR ) 10 MG tablet Take 1 tablet (10 mg total) by mouth daily. 01/17/23   Thukkani, Arun K, MD  sevelamer  carbonate (RENVELA ) 800 MG tablet Take 800 mg by mouth 3 (three) times daily with meals.    [provider]    Allergies: Colchicine, Indapamide, Lipitor [atorvastatin ], Viagra [sildenafil], and Zyloprim [allopurinol]    Review of Systems  Unable to perform ROS: Patient unresponsive    Updated Vital Signs Pulse (!) 115   Resp (!) 25   Ht 5' 7 (1.702 m)   SpO2 100%   BMI 30.01 kg/m   Physical Exam Vitals and nursing note reviewed.   68 year old male, with CPR in progress. Head is normocephalic and atraumatic.  Pupils are midposition and fixed, King airway is in place. Lungs have symmetric air movement. Heart tones are not appreciated. Extremities: Intraosseous line is present on the left leg. Neurologic: Unresponsive, no spontaneous movement or respirations.  GCS=3.  (all labs ordered are listed, but only abnormal results are displayed) Labs Reviewed  CBC WITH DIFFERENTIAL/PLATELET - Abnormal; Notable for the following components:      Result  Value   MCHC 28.4 (*)    Platelets 113 (*)    nRBC 1.3 (*)    Abs Immature Granulocytes 0.70 (*)    All other components within normal limits  BASIC METABOLIC PANEL WITH GFR - Abnormal; Notable for the following components:   Glucose, Bld 116 (*)    BUN 25 (*)    Creatinine, Ser 7.32 (*)    Calcium  8.7 (*)    GFR, Estimated 8 (*)    Anion gap 16 (*)    All other components within normal limits  BLOOD GAS, ARTERIAL - Abnormal; Notable for the following components:   pH,  Arterial 7.18 (*)    pO2, Arterial 368 (*)    Bicarbonate 16.4 (*)    Acid-base deficit 11.6 (*)    All other components within normal limits  TROPONIN I (HIGH SENSITIVITY) - Abnormal; Notable for the following components:   Troponin I (High Sensitivity) 152 (*)    All other components within normal limits  MAGNESIUM  CBG MONITORING, ED  TROPONIN I (HIGH SENSITIVITY)    EKG: ED ECG REPORT   Date: 10/21/2023  Rate: 98  Rhythm: Accelerated junctional rhythm  QRS Axis: right  Intervals: QT prolonged  ST/T Wave abnormalities: ST depressions inferiorly  Conduction Disutrbances:nonspecific intraventricular conduction delay  Narrative Interpretation: Accelerated junctional rhythm with nonspecific intraventricular conduction delay.  When compared with ECG of 01/09/2023, QRS duration has increased dramatically concerning for hyperkalemia  Old EKG Reviewed: changes noted  I have personally reviewed the EKG tracing and agree with the computerized printout as noted.  Radiology: CT Head Wo Contrast Result Date: 10/21/2023 EXAM: CT HEAD WITHOUT CONTRAST 10/21/2023 04:21:00 AM TECHNIQUE: CT of the head was performed without the administration of intravenous contrast. Automated exposure control, iterative reconstruction, and/or weight based adjustment of the mA/kV was utilized to reduce the radiation dose to as low as reasonably achievable. COMPARISON: 08/11/2021 CLINICAL HISTORY: Mental status change, unknown cause. Post CPR. FINDINGS: BRAIN AND VENTRICLES: No acute hemorrhage. Gray-white differentiation is preserved. No hydrocephalus. No extra-axial collection. No mass effect or midline shift. No hyperdense vessel is present. ORBITS: Bilateral lens replacements are noted. The globes and orbits are otherwise within normal limits. SINUSES: No acute abnormality. Fluid in the nasopharynx is likely secondary to intubation. SOFT TISSUES AND SKULL: No acute soft tissue abnormality. No skull fracture.  Atherosclerotic calcifications are present in the cavernous carotid arteries bilaterally and at the dural margin of both vertebral arteries. IMPRESSION: 1. No acute intracranial abnormality related to the clinical history of mental status change and post CPR. Electronically signed by: Lonni Necessary MD 10/21/2023 04:26 AM EDT RP Workstation: [id number]   DG Chest Portable 1 View Result Date: 10/21/2023 CLINICAL DATA:  Intubated EXAM: PORTABLE CHEST 1 VIEW COMPARISON:  06/05/2023, 06/06/2023 FINDINGS: Supine frontal view of the chest was obtained, with extensive artifact from the backboard. Endotracheal tube overlies tracheal air column, tip 1.4 cm above carina. Right internal jugular central venous catheter tip overlies superior vena cava at the atriocaval junction. The cardiac silhouette remains enlarged. Stable small right pleural effusion. No acute airspace disease or pneumothorax. IMPRESSION: 1. Endotracheal tube tip 1.4 cm above carina. 2. Continued enlargement of the cardiac silhouette, compatible with cardiomegaly and pericardial effusion seen on prior imaging studies. 3. Small right pleural effusion. Electronically Signed   By: Ozell Daring M.D.   On: 10/21/2023 03:00     Date/Time: 10/21/2023 2:35 AM  Performed by: Raford Lenis, MDLaryngoscope Size: Glidescope and 4 Grade  View: Grade I Tube size: 7.5 mm Number of attempts: 1 Airway Equipment and Method: Rigid stylet and Video-laryngoscopy Placement Confirmation: ETT inserted through vocal cords under direct vision, CO2 detector and Breath sounds checked- equal and bilateral Secured at: 25 cm Tube secured with: ETT holder Dental Injury: Teeth and Oropharynx as per pre-operative assessment       Cardiac monitor has shown a variety of rhythms from accelerated junctional rhythm to supraventricular tachycardia to torsade de pointes, per my interpretation.  Cardiopulmonary Resuscitation (CPR) Procedure Note Directed/Performed by:  Alm Lias I personally directed ancillary staff and/or performed CPR in an effort to regain return of spontaneous circulation and to maintain cardiac, neuro and systemic perfusion.   CRITICAL CARE Performed by: Alm Lias Total critical care time: 155 minutes Critical care time was exclusive of separately billable procedures and treating other patients. Critical care was necessary to treat or prevent imminent or life-threatening deterioration. Critical care was time spent personally by me on the following activities: development of treatment plan with patient and/or surrogate as well as nursing, discussions with consultants, evaluation of patient's response to treatment, examination of patient, obtaining history from patient or surrogate, ordering and performing treatments and interventions, ordering and review of laboratory studies, ordering and review of radiographic studies, pulse oximetry and re-evaluation of patient's condition. Medications Ordered in the ED  EPINEPHrine  (ADRENALIN ) 5 mg in NS 250 mL (0.02 mg/mL) premix infusion (20 mcg/min Intravenous Rate/Dose Change 10/21/23 0251)  fentaNYL  in NS (29mcg/ml) infusion-PREMIX (150 mcg/hr Intravenous Rate/Dose Change 10/21/23 0449)  midazolam  (VERSED ) 100 mg/100 mL (1 mg/mL) premix infusion (4 mg/hr Intravenous Rate/Dose Change 10/21/23 0445)  midazolam  (VERSED ) 5 MG/5ML injection 5 mg (5 mg Intravenous Given 10/21/23 9660)                                    Medical Decision Making Amount and/or Complexity of Data Reviewed Labs: ordered. Radiology: ordered.  Risk Prescription drug management. Decision regarding hospitalization.   Patient arrived with CPR in progress.  No pulses were felt.  I ordered additional epinephrine , and following this, I was able to palpate pulses with good blood pressure.  I removed his Myrna airway and inserted an endotracheal tube.  He was maintained on epinephrine  drip, but blood pressure did  drop down and I ordered additional epinephrine .  Observed rhythm changed from accelerated junctional rhythm to supraventricular tachycardia, possible episodes of ventricular tachycardia, episodes of torsade de pointes.  I reviewed his electrocardiogram obtained at the time when he was in a stable rhythm and my interpretation is accelerated junctional rhythm with marked QRS widening concerning for hyperkalemia.  I ordered additional calcium  and I ordered magnesium because of the torsade de pointes.  Later, labs had come back and my interpretation is normal potassium and normal magnesium, also normal CBC.  Several times his blood pressure dropped and pulses became thready or which responded to additional epinephrine .  Heart rate has stabilized on epinephrine  infusion.  He is starting to have attempts to breathe and is actually requiring sedation.  I am sedating him with both fentanyl  and midazolam .  I have discussed case with Dr. Volanda of pulmonary critical care service who requests CT of head be obtained and would except the patient following that.  I have reviewed his laboratory test, my interpretation is metabolic acidosis consistent with prolonged CPR, elevated BUN and creatinine consistent with known end-stage renal disease,  elevated random glucose level, elevated troponin which will need to be trended but is not unexpected in the setting of prolonged CPR, thrombocytopenia which is stable.  Chest x-ray showed adequate positioning of endotracheal tube.  CT of head shows no acute intracranial abnormality.  I have independently viewed all of these images, and agree with the radiologist's interpretation.  Arrangements are made to admit to [facility] intensive care unit.  Final diagnoses:  Cardiac arrest [facility])  End-stage renal disease on hemodialysis (HCC)  Thrombocytopenia [facility])    ED Discharge Orders     None          Raford Lenis, MD 10/21/23 [phone]

## 2023-10-21 NOTE — ED Notes (Signed)
 [facility], RN, and EDP at bedside. EDP to place central line. Attempt to place OG tube at this time as well.

## 2023-10-21 NOTE — ED Notes (Signed)
 MD and EDP notified of pt BP.

## 2023-10-21 NOTE — ED Notes (Signed)
 1 epi push given at 0240

## 2023-10-21 NOTE — Progress Notes (Signed)
 Called Lead RT to let them know patient would be coming to them, but not sure when.  Gave report to RT at that time.

## 2023-10-21 NOTE — Progress Notes (Signed)
 RT x 2 at bedside for aline placement. However RT was unsuccessful  with Aline placement. CCM MD aware, RN aware.

## 2023-10-22 ENCOUNTER — Inpatient Hospital Stay (HOSPITAL_COMMUNITY)

## 2023-10-22 DIAGNOSIS — E162 Hypoglycemia, unspecified: Secondary | ICD-10-CM | POA: Diagnosis not present

## 2023-10-22 DIAGNOSIS — R570 Cardiogenic shock: Secondary | ICD-10-CM | POA: Diagnosis not present

## 2023-10-22 DIAGNOSIS — I5022 Chronic systolic (congestive) heart failure: Secondary | ICD-10-CM | POA: Diagnosis not present

## 2023-10-22 DIAGNOSIS — I469 Cardiac arrest, cause unspecified: Secondary | ICD-10-CM | POA: Diagnosis not present

## 2023-10-22 LAB — POCT I-STAT 7, (LYTES, BLD GAS, ICA,H+H)
Acid-base deficit: 3 mmol/L — ABNORMAL HIGH (ref 0.0–2.0)
Bicarbonate: 18.9 mmol/L — ABNORMAL LOW (ref 20.0–28.0)
Calcium, Ion: 1.09 mmol/L — ABNORMAL LOW (ref 1.15–1.40)
HCT: 39 % (ref 39.0–52.0)
Hemoglobin: 13.3 g/dL (ref 13.0–17.0)
O2 Saturation: 100 %
Patient temperature: 99.6
Potassium: 4.4 mmol/L (ref 3.5–5.1)
Sodium: 133 mmol/L — ABNORMAL LOW (ref 135–145)
TCO2: 20 mmol/L — ABNORMAL LOW (ref 22–32)
pCO2 arterial: 26 mmHg — ABNORMAL LOW (ref 32–48)
pH, Arterial: 7.472 — ABNORMAL HIGH (ref 7.35–7.45)
pO2, Arterial: 231 mmHg — ABNORMAL HIGH (ref 83–108)

## 2023-10-22 LAB — GLUCOSE, CAPILLARY
Glucose-Capillary: 145 mg/dL — ABNORMAL HIGH (ref 70–99)
Glucose-Capillary: 86 mg/dL (ref 70–99)
Glucose-Capillary: 83 mg/dL (ref 70–99)
Glucose-Capillary: 80 mg/dL (ref 70–99)
Glucose-Capillary: 89 mg/dL (ref 70–99)
Glucose-Capillary: 104 mg/dL — ABNORMAL HIGH (ref 70–99)

## 2023-10-22 LAB — CBC
HCT: 43.1 % (ref 39.0–52.0)
Hemoglobin: 13.4 g/dL (ref 13.0–17.0)
MCH: 26.5 pg (ref 26.0–34.0)
MCHC: 31.1 g/dL (ref 30.0–36.0)
MCV: 85.2 fL (ref 80.0–100.0)
Platelets: 153 10*3/uL (ref 150–400)
RBC: 5.06 MIL/uL (ref 4.22–5.81)
RDW: 15.2 % (ref 11.5–15.5)
WBC: 8.7 10*3/uL (ref 4.0–10.5)
nRBC: 0 % (ref 0.0–0.2)

## 2023-10-22 LAB — COMPREHENSIVE METABOLIC PANEL WITH GFR
ALT: 24 U/L (ref 0–44)
AST: 62 U/L — ABNORMAL HIGH (ref 15–41)
Albumin: 2.3 g/dL — ABNORMAL LOW (ref 3.5–5.0)
Alkaline Phosphatase: 34 U/L — ABNORMAL LOW (ref 38–126)
Anion gap: 10 (ref 5–15)
BUN: 37 mg/dL — ABNORMAL HIGH (ref 8–23)
CO2: 21 mmol/L — ABNORMAL LOW (ref 22–32)
Calcium: 8.2 mg/dL — ABNORMAL LOW (ref 8.9–10.3)
Chloride: 101 mmol/L (ref 98–111)
Creatinine, Ser: 8.27 mg/dL — ABNORMAL HIGH (ref 0.61–1.24)
GFR, Estimated: 7 mL/min — ABNORMAL LOW (ref >60)
Glucose, Bld: 136 mg/dL — ABNORMAL HIGH (ref 70–99)
Potassium: 4.3 mmol/L (ref 3.5–5.1)
Sodium: 132 mmol/L — ABNORMAL LOW (ref 135–145)
Total Bilirubin: 1.7 mg/dL — ABNORMAL HIGH (ref 0.0–1.2)
Total Protein: 4.9 g/dL — ABNORMAL LOW (ref 6.5–8.1)

## 2023-10-22 LAB — HEPARIN LEVEL (UNFRACTIONATED): Heparin Unfractionated: 0.9 [IU]/mL — ABNORMAL HIGH (ref 0.30–0.70)

## 2023-10-22 LAB — APTT
aPTT: 33 s (ref 24–36)
aPTT: 32 s (ref 24–36)

## 2023-10-22 MED ORDER — HEPARIN BOLUS VIA INFUSION
2000.0000 [IU] | Freq: Once | INTRAVENOUS | Status: AC
  Administered 2023-10-22: 2000 [IU] via INTRAVENOUS
  Filled 2023-10-22: qty 2000

## 2023-10-22 MED ORDER — POLYVINYL ALCOHOL 1.4 % OP SOLN
1.0000 [drp] | OPHTHALMIC | Status: DC | PRN
  Administered 2023-10-22 (×2): 1 [drp] via OPHTHALMIC
  Filled 2023-10-22: qty 15

## 2023-10-22 NOTE — Progress Notes (Signed)
 Pt's versed  and fentanyl  gtts pulled from [facility] pyxis, unable to waste via 86M pyxis. Gtts wasted with Brigida Eastern, RN as witness.   1600 mcg Fentanyl  wasted.  75mg  versed  wasted.

## 2023-10-22 NOTE — Progress Notes (Signed)
 Called to  bedside for hypoxemia. On my arrival patient being bagged with sats int he 54s. Easy to bag per RRT and NP. Breath sounds diminished left lung field. Bedside ultrasound showed left lung sliding. I had them move the ETT out 1-2 cm to make sure no RMS intubation, no improvement in breath sounds or sats. Chest xray shows left sided atelectasis likely mucus plugging. Sats subsequetly improved into the 80s. Placed back on vent. Discussed saline lavage with RRT.   Additional cc time 30 minutes.   Verdon Gore, MD Pulmonary and Critical Care Medicine [facility] 10/22/2023 12:33 PM Pager: see AMION  If no response to pager, please call critical care on call (see AMION) until 7pm After 7:00 pm call Elink

## 2023-10-22 NOTE — Plan of Care (Signed)
   Problem: Clinical Measurements: Goal: Cardiovascular complication will be avoided Outcome: Progressing   Problem: Pain Managment: Goal: General experience of comfort will improve and/or be controlled Outcome: Progressing

## 2023-10-22 NOTE — Progress Notes (Signed)
 PHARMACY - ANTICOAGULATION CONSULT NOTE  Pharmacy Consult for heparin  Indication: atrial fibrillation  Labs: Recent Labs    10/21/23 0213 10/21/23 0442 10/21/23 1105 10/21/23 1538 10/21/23 2014 10/22/23 0523  HGB 13.0  --  12.4* 14.3  --  13.4  HCT 45.7  --  41.2 42.0  --  43.1  PLT 113*  --  163  --   --  153  APTT  --   --   --   --  36 33  HEPARINUNFRC  --   --   --   --  0.98* 0.90*  CREATININE 7.32*  --  7.76*  --   --  8.27*  TROPONINIHS 152* 2,249*  --   --   --   --    Assessment: 68yo male remains subtherapeutic on heparin  after rate change; no infusion issues or signs of bleeding per RN.  Goal of Therapy:  aPTT 66-102 seconds   Plan:  Increase heparin  infusion by 3-4 units/kg/hr to 1800 units/hr. Check PTT in 8 hours.   Marvetta Dauphin, PharmD, BCPS 10/22/2023 6:44 AM

## 2023-10-22 NOTE — Progress Notes (Signed)
 NAME:  Michael Mollett., [name]:  [id number], DOB:  [birth year], LOS: 1 ADMISSION DATE:  10/21/2023, CONSULTATION DATE:  10/22/2023 REFERRING MD:  [facility]P at [facility], CHIEF COMPLAINT:  Cardiac Arrest   History of Present Illness:  This is a 68 year old gentleman with a history of end-stage renal disease on dialysis who presents from out-of-hospital cardiac arrest.  History is obtained from chart review and discussion with bedside providers.  Patient went unresponsive witnessed by wife at home.  She started CPR within about 10 minutes.  He has had over an hour of prolonged resuscitative efforts.  Rhythms have varied from PEA to V. tach to V-fib.  He is also anticoagulated on apixaban .  His blood sugars are low refractory to dextrose  administration.  He was intubated in the ED at [facility] and transferred to [facility], ICU for further care.  He is now on norepinephrine , epinephrine  and vasopressin  infusions.  He is sedated with fentanyl  and Versed .  Pertinent  Medical History  ESRD on HD Atrial fibs/flutter on Eliquis  Chronic HFrEF EF less than 20%   Significant Hospital Events: Including procedures, antibiotic start and stop dates in addition to other pertinent events     Interim History / Subjective:   No overnight issues. Off sedation. Still on norepinephrine  and vasopressin . Off the epi. Not responsive.  Objective    Blood pressure (!) 85/59, pulse 85, temperature 99.8 F (37.7 C), temperature source Axillary, resp. rate 14, height 5' 7 (1.702 m), weight 89.3 kg, SpO2 (!) 88%.    Vent Mode: PRVC FiO2 (%):  [60 %-100 %] 100 % Set Rate:  [24 bmp] 24 bmp Vt Set:  [530 mL] 530 mL PEEP:  [5 cmH20] 5 cmH20 Plateau Pressure:  [18 cmH20-22 cmH20] 18 cmH20   Intake/Output Summary (Last 24 hours) at 10/22/2023 1233 Last data filed at 10/22/2023 0900 Gross per 24 hour  Intake 3045.58 ml  Output --  Net 3045.58 ml   Filed Weights   10/21/23 1100  Weight: 89.3 kg    Examination: Gen:       Intubated, sedated, acutely ill appearing HEENT:  ETT to vent, pupils sluggish, minimally reactive, anisocoria L>R Lungs:    sounds of mechanical ventilation auscultated no wheeze CV:         RRR Abd:      + bowel sounds; soft, non-tender; no palpable masses, no distension Ext:    No edema Skin:      Warm and dry; no rashes Neuro:   not sedated, RASS -5, does not have a cough or gag, no corneal reflex, no withdrawal from noxious stimuli  Na 133 K 4.4 Cr 8.27 BUN 37 WBC 8.7 Hgb 13.4    Resolved problem list   Assessment and Plan   Out-of-hospital cardiac arrest with prolonged gestation greater than 1 hour Cardiogenic shock Chronic HFrEF EF<20% - Unclear etiology for the arrest but he is clearly very chronically ill - Monitor electrolytes goal K over 4 mag over 2 - EKG obtained was irregular but he is now regular on telemetry. - Currently on 3 vasopressor support for goal systolic over 90  Concern for Anoxic Brain Injury - wife concerned for EEG. She would like to rule out seizures for any treatable source for initial insult or encephalopathy.  - hold all sedation, monitor response - stat EEG  Refractory hypoglycemia - continue dextrose  infusion  End stage renal disease on HD -Unable to tolerate HD or CRRT at this point given his multi vasopressor  shock -Monitor Bmet - nephrology consultation  Atrial fib/flutter - hold eliquis  - heparin  gtt   Best Practice (right click and Reselect all SmartList Selections daily)   Diet/type: NPO DVT prophylaxis systemic heparin  Pressure ulcer(s): pressure ulcer assessment deferred  GI prophylaxis: H2B Lines: Central line Foley:  Yes, and it is still needed Code Status:  full code Last date of multidisciplinary goals of care discussion maurene Doffing and sister updated over the phone 6/22 am]  Goals of care - if seizures have been ruled out, Doffing would like to proceed with comfort measures as she says Sam never would have  wanted to be in a vegetative state. She does not want him to suffer. Just wants time to process. Anticipate transition to comfort measures later today.  The patient is critically ill due to cardiac arrest.  Critical care was necessary to treat or prevent imminent or life-threatening deterioration.  Critical care was time spent personally by me on the following activities: development of treatment plan with patient and/or surrogate as well as nursing, discussions with consultants, evaluation of patient's response to treatment, examination of patient, obtaining history from patient or surrogate, ordering and performing treatments and interventions, ordering and review of laboratory studies, ordering and review of radiographic studies, pulse oximetry, re-evaluation of patient's condition and participation in multidisciplinary rounds.   Critical Care Time devoted to patient care services described in this note is 45 minutes. This time reflects time of care of this signee Ravenne Wayment S Mabry Tift . This critical care time does not reflect separately billable procedures or procedure time, teaching time or supervisory time of PA/NP/Med student/Med Resident etc but could involve care discussion time.       Verdon GORMAN [facility] 10/22/2023 12:34 PM  Pager: see AMION  If no response to pager , please call critical care on call (see AMION) until 7pm After 7:00 pm call Elink

## 2023-10-22 NOTE — IPAL (Signed)
  Interdisciplinary Goals of Care Family Meeting   Date carried out: 10/22/2023  Location of the meeting: Bedside  Member's involved: Physician, Bedside Registered Nurse, and Family Member or next of kin  Durable Power of Attorney or acting medical decision maker: Wife, [name]. Sister Elenor on the phone is an ED nurse.   Discussion: We discussed goals of care for Michael [name]. .  Reviewed results of EEG which are negative for seizures and overall show very diminished brain wave activity. Discussed plan for comfort care. [name] wants some time for family to video in and speak to patient. She wants to make some arrangements. Plan is for comfort care Monday at noon. If clinical decline overnight recommend sooner transition to comfort measures.   Code status:   Code Status: Do not attempt resuscitation (DNR) PRE-ARREST INTERVENTIONS DESIRED   Disposition: Continue current acute care  Time spent for the meeting: 15 minutes    [name], MD  10/22/2023, 2:55 PM

## 2023-10-22 NOTE — Plan of Care (Signed)
  Problem: Clinical Measurements: Goal: Will remain free from infection Outcome: Progressing Goal: Diagnostic test results will improve Outcome: Progressing Goal: Cardiovascular complication will be avoided Outcome: Progressing   Problem: Coping: Goal: Level of anxiety will decrease Outcome: Progressing   Problem: Elimination: Goal: Will not experience complications related to bowel motility Outcome: Progressing Goal: Will not experience complications related to urinary retention Outcome: Progressing   Problem: Pain Managment: Goal: General experience of comfort will improve and/or be controlled Outcome: Progressing   Problem: Safety: Goal: Ability to remain free from injury will improve Outcome: Progressing   Problem: Skin Integrity: Goal: Risk for impaired skin integrity will decrease Outcome: Progressing    Problem: Education: Goal: Knowledge of General Education information will improve Description: Including pain rating scale, medication(s)/side effects and non-pharmacologic comfort measures Outcome: Not Progressing   Problem: Health Behavior/Discharge Planning: Goal: Ability to manage health-related needs will improve Outcome: Not Progressing   Problem: Clinical Measurements: Goal: Ability to maintain clinical measurements within normal limits will improve Outcome: Not Progressing Goal: Respiratory complications will improve Outcome: Not Progressing   Problem: Activity: Goal: Risk for activity intolerance will decrease Outcome: Not Progressing   Problem: Nutrition: Goal: Adequate nutrition will be maintained Outcome: Not Progressing

## 2023-10-22 NOTE — Progress Notes (Signed)
Stat  EEG complete - results pending.  

## 2023-10-23 DIAGNOSIS — R570 Cardiogenic shock: Secondary | ICD-10-CM | POA: Diagnosis not present

## 2023-10-23 DIAGNOSIS — R569 Unspecified convulsions: Secondary | ICD-10-CM

## 2023-10-23 DIAGNOSIS — I469 Cardiac arrest, cause unspecified: Secondary | ICD-10-CM | POA: Diagnosis not present

## 2023-10-23 DIAGNOSIS — J9601 Acute respiratory failure with hypoxia: Secondary | ICD-10-CM | POA: Diagnosis not present

## 2023-10-23 DIAGNOSIS — I5022 Chronic systolic (congestive) heart failure: Secondary | ICD-10-CM | POA: Diagnosis not present

## 2023-10-23 LAB — CBC
HCT: 38.3 % — ABNORMAL LOW (ref 39.0–52.0)
Hemoglobin: 12.2 g/dL — ABNORMAL LOW (ref 13.0–17.0)
MCH: 26.8 pg (ref 26.0–34.0)
MCHC: 31.9 g/dL (ref 30.0–36.0)
MCV: 84 fL (ref 80.0–100.0)
Platelets: 155 10*3/uL (ref 150–400)
RBC: 4.56 MIL/uL (ref 4.22–5.81)
RDW: 15.4 % (ref 11.5–15.5)
WBC: 8 10*3/uL (ref 4.0–10.5)
nRBC: 0 % (ref 0.0–0.2)

## 2023-10-23 LAB — GLUCOSE, CAPILLARY
Glucose-Capillary: 99 mg/dL (ref 70–99)
Glucose-Capillary: 119 mg/dL — ABNORMAL HIGH (ref 70–99)
Glucose-Capillary: 79 mg/dL (ref 70–99)

## 2023-10-23 LAB — APTT: aPTT: >200 s (ref 24–36)

## 2023-10-23 MED ORDER — ACETAMINOPHEN 325 MG PO TABS: 650.0000 mg | ORAL_TABLET | Freq: Four times a day (QID) | ORAL | Status: DC | PRN

## 2023-10-23 MED ORDER — MORPHINE 100MG IN NS 100ML (1MG/ML) PREMIX INFUSION
0.0000 mg/h | INTRAVENOUS | Status: DC
  Administered 2023-10-23: 5 mg/h via INTRAVENOUS
  Filled 2023-10-23: qty 100

## 2023-10-23 MED ORDER — HEPARIN (PORCINE) 25000 UT/250ML-% IV SOLN
1500.0000 [IU]/h | INTRAVENOUS | Status: DC
  Administered 2023-10-23: 1500 [IU]/h via INTRAVENOUS
  Filled 2023-10-23: qty 250

## 2023-10-23 MED ORDER — SODIUM BICARBONATE 8.4 % IV SOLN
INTRAVENOUS | Status: AC
  Administered 2023-10-23: 50 meq via INTRAVENOUS
  Filled 2023-10-23: qty 50

## 2023-10-23 MED ORDER — FENTANYL CITRATE PF 50 MCG/ML IJ SOSY
50.0000 ug | PREFILLED_SYRINGE | INTRAMUSCULAR | Status: DC | PRN
  Administered 2023-10-23: 50 ug via INTRAVENOUS
  Filled 2023-10-23: qty 1

## 2023-10-23 MED ORDER — POLYVINYL ALCOHOL 1.4 % OP SOLN: 1.0000 [drp] | Freq: Four times a day (QID) | OPHTHALMIC | Status: DC | PRN

## 2023-10-23 MED ORDER — SODIUM BICARBONATE 8.4 % IV SOLN: 50.0000 meq | Freq: Once | INTRAVENOUS | Status: AC

## 2023-10-23 MED ORDER — PHENYLEPHRINE HCL-NACL 20-0.9 MG/250ML-% IV SOLN
0.0000 ug/min | INTRAVENOUS | Status: DC
  Administered 2023-10-23: 100 ug/min via INTRAVENOUS
  Administered 2023-10-23: 200 ug/min via INTRAVENOUS
  Administered 2023-10-23: 180 ug/min via INTRAVENOUS
  Administered 2023-10-23: 200 ug/min via INTRAVENOUS
  Filled 2023-10-23 (×3): qty 250

## 2023-10-23 MED ORDER — FENTANYL CITRATE PF 50 MCG/ML IJ SOSY
PREFILLED_SYRINGE | INTRAMUSCULAR | Status: AC
  Administered 2023-10-23: 50 ug via INTRAVENOUS
  Filled 2023-10-23: qty 1

## 2023-10-23 MED ORDER — SODIUM CHLORIDE 0.9% FLUSH: 3.0000 mL | Freq: Two times a day (BID) | INTRAVENOUS | Status: DC

## 2023-10-23 MED ORDER — SODIUM CHLORIDE 0.9% FLUSH: 3.0000 mL | INTRAVENOUS | Status: DC | PRN

## 2023-10-23 MED ORDER — MORPHINE BOLUS VIA INFUSION
5.0000 mg | INTRAVENOUS | Status: DC | PRN
  Administered 2023-10-23 (×4): 5 mg via INTRAVENOUS

## 2023-10-23 MED ORDER — GLYCOPYRROLATE 0.2 MG/ML IJ SOLN
0.2000 mg | INTRAMUSCULAR | Status: DC | PRN
  Administered 2023-10-23: 0.2 mg via INTRAVENOUS
  Filled 2023-10-23: qty 1

## 2023-10-23 MED ORDER — ONDANSETRON HCL 4 MG/2ML IJ SOLN: 4.0000 mg | Freq: Four times a day (QID) | INTRAMUSCULAR | Status: DC | PRN

## 2023-10-23 MED ORDER — ACETAMINOPHEN 650 MG RE SUPP: 650.0000 mg | Freq: Four times a day (QID) | RECTAL | Status: DC | PRN

## 2023-10-23 MED ORDER — ONDANSETRON 4 MG PO TBDP: 4.0000 mg | ORAL_TABLET | Freq: Four times a day (QID) | ORAL | Status: DC | PRN

## 2023-10-23 MED ORDER — GLYCOPYRROLATE 1 MG PO TABS
1.0000 mg | ORAL_TABLET | ORAL | Status: DC | PRN
Start: 2023-10-23 — End: 2023-10-24

## 2023-10-23 MED ORDER — GLYCOPYRROLATE 0.2 MG/ML IJ SOLN: 0.2000 mg | INTRAMUSCULAR | Status: DC | PRN

## 2023-10-24 DIAGNOSIS — Z515 Encounter for palliative care: Secondary | ICD-10-CM

## 2023-10-24 DIAGNOSIS — R579 Shock, unspecified: Secondary | ICD-10-CM | POA: Insufficient documentation

## 2023-10-24 DIAGNOSIS — Z66 Do not resuscitate: Secondary | ICD-10-CM | POA: Insufficient documentation

## 2023-10-31 NOTE — Death Summary Note (Signed)
 DEATH SUMMARY   Patient Details  Name: Michael [name]. [name]: [id number] DOB: 1955/12/06  Admission/Discharge Information   Admit Date:  2023/10/29  Date of Death: Date of Death: October 31, 2023  Time of Death: Time of Death: Nov 07, 1647  Length of Stay: 2  Referring Physician: Gib Charleston, MD   Reason(s) for Hospitalization  Cardiac arrest  Diagnoses  Preliminary cause of death: anoxic brain injury Secondary Diagnoses (including complications and co-morbidities):  Principal Problem:   Anoxic brain injury [facility]) Active Problems:   Nonischemic cardiomyopathy (HCC)   Cardiac arrest (HCC)   Essential hypertension   End stage renal disease (HCC)   Chronic HFrEF (heart failure with reduced ejection fraction) (HCC)   Pericardial effusion   DNR (do not resuscitate)   Shock (HCC)   Comfort measures only status   Brief Hospital Course (including significant findings, care, treatment, and services provided and events leading to death)  Jabir Dahlem. is a 68 y.o. year old male who Has significant history of heart failure as well as ESRD on HD who had a prolonged cardiac arrest for reportedly greater than 1 hour admitted to the ICU subsequently on the ventilator and in profound shock.  Progressive shock leading to multiple pressors.  EEG without seizures but significant encephalopathy.  He was on no sedation.  His neurologic exam was very poor, essentially persistent vegetative state.  Goals of care discussions were had.  He was made a DNR. Ssubsequent goals of care yielded comfort measures.  He was terminally extubated.  Medicines for his comfort were made available.  Vasopressors were stopped.  He subsequently died peacefully.    Pertinent Labs and Studies  Significant Diagnostic Studies EEG adult Result Date: 10-31-23 Shelton Arlin KIDD, MD     October 31, 2023  7:59 AM Patient Name: Michael [name]. [name]: [id number] Epilepsy Attending: Arlin KIDD Shelton Referring Physician/Provider: Meade Verdon RAMAN, MD Date: 10/22/2023 Duration: 22.26 mins Patient history: 68yo M with ams. EEG to evaluate for seizure Level of alertness: comatose AEDs during EEG study: None Technical aspects: This EEG study was done with scalp electrodes positioned according to the 10-20 International system of electrode placement. Electrical activity was reviewed with band pass filter of 1-70Hz , sensitivity of 7 uV/mm, display speed of 64mm/sec with a 60Hz  notched filter applied as appropriate. EEG data were recorded continuously and digitally stored.  Video monitoring was available and reviewed as appropriate. Description: EEG showed continuous generalized background suppression. Hyperventilation and photic stimulation were not performed.   ABNORMALITY - Background suppression, generalized IMPRESSION: This study is suggestive of profound diffuse encephalopathy. No seizures or epileptiform discharges were seen throughout the recording. Priyanka KIDD Shelton   DG CHEST PORT 1 VIEW Result Date: 10/22/2023 CLINICAL DATA:  Respiration abnormal. EXAM: PORTABLE CHEST 1 VIEW COMPARISON:  Oct 29, 2023 FINDINGS: There is a right chest wall dialysis catheter with tips in the right atrium. Enteric tube courses below the level of the GE junction. Left IJ catheter tip is in the projection of the SVC. ET tube tip is 4.9 cm above the carina. There is been interval complete opacification of the left hemithorax. The cardiac silhouette is obscured by the left lung opacification.Unchanged small right pleural effusion. IMPRESSION: 1. Interval complete opacification of the left hemithorax compatible with atelectasis due to underlying mucous plug and/or pleural effusion. 2. Unchanged small right pleural effusion. 3. Support apparatus as above. These results will be called to the ordering clinician or representative by the Radiologist Assistant, and communication documented in the PACS or  [facility] Dashboard. Electronically Signed   By: Waddell Calk M.D.   On:  10/22/2023 12:10   DG Abd Portable 1V Result Date: 10/21/2023 CLINICAL DATA:  Evaluate OG tube placement. EXAM: PORTABLE ABDOMEN - 1 VIEW COMPARISON:  10/21/2023 FINDINGS: The enteric 2 tip and side port are below the level of the GE junction. Paucity of bowel gas is again noted within the imaged portions of the upper abdomen. Cardiac enlargement. The right internal jugular venous catheter tip projects over the right atrium. Bilateral pleural effusions are again noted. IMPRESSION: Enteric tube tip and side port are below the level of the GE junction. Electronically Signed   By: Waddell Calk M.D.   On: 10/21/2023 11:40   DG Abd Portable 1 View Result Date: 10/21/2023 CLINICAL DATA:  Status post central line placement EXAM: PORTABLE ABDOMEN - 1 VIEW; PORTABLE CHEST - 1 VIEW COMPARISON:  Earlier same day chest radiograph at 2:41 a.m., CT abdomen dated 09/04/2012 FINDINGS: Lines/tubes: Endotracheal tube tip projects 2.5 cm above the carina. Gastric/enteric tube tip projects over the stomach. Right internal jugular venous catheter tip projects over the right atrium. Central venous catheter tip projects over the SVC. Chest: Mild diffuse interstitial opacities. Small bilateral pleural effusions. No pneumothorax. Similar markedly enlarged cardiomediastinal silhouette. Abdomen: Paucity of bowel gas in the partially imaged upper abdomen. Bones: No acute osseous abnormality. IMPRESSION: 1. Endotracheal tube tip projects 2.5 cm above the carina. 2. Right internal jugular venous catheter tip projects over the right atrium. No pneumothorax. 3. Mild diffuse interstitial opacities, likely pulmonary edema. 4. Small bilateral pleural effusions. 5. Paucity of bowel gas in the partially imaged upper abdomen. Electronically Signed   By: Limin  Xu M.D.   On: 10/21/2023 09:13   DG Chest Portable 1 View Result Date: 10/21/2023 CLINICAL DATA:  Status post central line placement EXAM: PORTABLE ABDOMEN - 1 VIEW; PORTABLE CHEST - 1  VIEW COMPARISON:  Earlier same day chest radiograph at 2:41 a.m., CT abdomen dated 09/04/2012 FINDINGS: Lines/tubes: Endotracheal tube tip projects 2.5 cm above the carina. Gastric/enteric tube tip projects over the stomach. Right internal jugular venous catheter tip projects over the right atrium. Central venous catheter tip projects over the SVC. Chest: Mild diffuse interstitial opacities. Small bilateral pleural effusions. No pneumothorax. Similar markedly enlarged cardiomediastinal silhouette. Abdomen: Paucity of bowel gas in the partially imaged upper abdomen. Bones: No acute osseous abnormality. IMPRESSION: 1. Endotracheal tube tip projects 2.5 cm above the carina. 2. Right internal jugular venous catheter tip projects over the right atrium. No pneumothorax. 3. Mild diffuse interstitial opacities, likely pulmonary edema. 4. Small bilateral pleural effusions. 5. Paucity of bowel gas in the partially imaged upper abdomen. Electronically Signed   By: Limin  Xu M.D.   On: 10/21/2023 09:13   CT Head Wo Contrast Result Date: 10/21/2023 EXAM: CT HEAD WITHOUT CONTRAST 10/21/2023 04:21:00 AM TECHNIQUE: CT of the head was performed without the administration of intravenous contrast. Automated exposure control, iterative reconstruction, and/or weight based adjustment of the mA/kV was utilized to reduce the radiation dose to as low as reasonably achievable. COMPARISON: 08/11/2021 CLINICAL HISTORY: Mental status change, unknown cause. Post CPR. FINDINGS: BRAIN AND VENTRICLES: No acute hemorrhage. Gray-white differentiation is preserved. No hydrocephalus. No extra-axial collection. No mass effect or midline shift. No hyperdense vessel is present. ORBITS: Bilateral lens replacements are noted. The globes and orbits are otherwise within normal limits. SINUSES: No acute abnormality. Fluid in the nasopharynx is likely secondary to intubation. SOFT TISSUES AND SKULL: No acute  soft tissue abnormality. No skull fracture.  Atherosclerotic calcifications are present in the cavernous carotid arteries bilaterally and at the dural margin of both vertebral arteries. IMPRESSION: 1. No acute intracranial abnormality related to the clinical history of mental status change and post CPR. Electronically signed by: Lonni Necessary MD 10/21/2023 04:26 AM EDT RP Workstation: [id number]   DG Chest Portable 1 View Result Date: 10/21/2023 CLINICAL DATA:  Intubated EXAM: PORTABLE CHEST 1 VIEW COMPARISON:  06/05/2023, 06/06/2023 FINDINGS: Supine frontal view of the chest was obtained, with extensive artifact from the backboard. Endotracheal tube overlies tracheal air column, tip 1.4 cm above carina. Right internal jugular central venous catheter tip overlies superior vena cava at the atriocaval junction. The cardiac silhouette remains enlarged. Stable small right pleural effusion. No acute airspace disease or pneumothorax. IMPRESSION: 1. Endotracheal tube tip 1.4 cm above carina. 2. Continued enlargement of the cardiac silhouette, compatible with cardiomegaly and pericardial effusion seen on prior imaging studies. 3. Small right pleural effusion. Electronically Signed   By: Ozell Daring M.D.   On: 10/21/2023 03:00    Microbiology Recent Results (from the past 240 hours)  MRSA Next Gen by PCR, Nasal     Status: None   Collection Time: 10/21/23 10:23 AM   Specimen: Nasal Mucosa; Nasal Swab  Result Value Ref Range Status   MRSA by PCR Next Gen NOT DETECTED NOT DETECTED Final    Comment: (NOTE) The GeneXpert MRSA Assay (FDA approved for NASAL specimens only), is one component of a comprehensive MRSA colonization surveillance program. It is not intended to diagnose MRSA infection nor to guide or monitor treatment for MRSA infections. Test performance is not FDA approved in patients less than 61 years old. Performed at [facility], [address], [place], [place] [address]     Lab Basic Metabolic Panel: Recent Labs   Lab 10/21/23 [phone] 10/21/23 1105 10/21/23 1538 10/22/23 0523 10/22/23 1154  NA 139 139 136 132* 133*  K 3.8 4.1 3.9 4.3 4.4  CL 100 99  --  101  --   CO2 23 22  --  21*  --   GLUCOSE 116* 284*  --  136*  --   BUN 25* 32*  --  37*  --   CREATININE 7.32* 7.76*  --  8.27*  --   CALCIUM  8.7* 8.6*  --  8.2*  --   MG 2.4 2.4  --   --   --    Liver Function Tests: Recent Labs  Lab 10/21/23 1105 10/22/23 0523  AST 71* 62*  ALT 21 24  ALKPHOS 29* 34*  BILITOT 1.2 1.7*  PROT 4.5* 4.9*  ALBUMIN 2.2* 2.3*   No results for input(s): LIPASE, AMYLASE in the last 168 hours. No results for input(s): AMMONIA in the last 168 hours. CBC: Recent Labs  Lab 10/21/23 0213 10/21/23 1105 10/21/23 1538 10/22/23 0523 10/22/23 1154  0317  WBC 6.7 13.8*  --  8.7  --  8.0  NEUTROABS 3.2  --   --   --   --   --   HGB 13.0 12.4* 14.3 13.4 13.3 12.2*  HCT 45.7 41.2 42.0 43.1 39.0 38.3*  MCV 95.2 89.6  --  85.2  --  84.0  PLT 113* 163  --  153  --  155   Cardiac Enzymes: No results for input(s): CKTOTAL, CKMB, CKMBINDEX, TROPONINI in the last 168 hours. Sepsis Labs: Recent Labs  Lab 10/21/23 9786 10/21/23 1048 10/21/23 1105 10/22/23  9476  0317  WBC 6.7  --  13.8* 8.7 8.0  LATICACIDVEN  --  3.8*  --   --   --     Procedures/Operations  As per EMR   Donnice SAUNDERS Naela Nodal 10/24/2023, 4:13 PM

## 2023-10-31 NOTE — Plan of Care (Signed)
  Problem: Pain Managment: Goal: General experience of comfort will improve and/or be controlled Outcome: Progressing   Problem: Safety: Goal: Ability to remain free from injury will improve Outcome: Progressing   Problem: Clinical Measurements: Goal: Respiratory complications will improve Outcome: Not Progressing Goal: Cardiovascular complication will be avoided Outcome: Not Progressing   Problem: Activity: Goal: Risk for activity intolerance will decrease Outcome: Not Progressing   Problem: Nutrition: Goal: Adequate nutrition will be maintained Outcome: Not Progressing   Problem: Elimination: Goal: Will not experience complications related to bowel motility Outcome: Not Progressing

## 2023-10-31 NOTE — Progress Notes (Signed)
 NAME:  Michael Sontag., [name]:  [id number], DOB:  [birth year], LOS: 2 ADMISSION DATE:  10/21/2023, CONSULTATION DATE:   REFERRING MD:  [facility]P at [facility], CHIEF COMPLAINT:  Cardiac Arrest   History of Present Illness:  This is a 68 year old gentleman with a history of end-stage renal disease on dialysis who presents from out-of-hospital cardiac arrest.  History is obtained from chart review and discussion with bedside providers.  Patient went unresponsive witnessed by wife at home.  She started CPR within about 10 minutes.  He has had over an hour of prolonged resuscitative efforts.  Rhythms have varied from PEA to V. tach to V-fib.  He is also anticoagulated on apixaban .  His blood sugars are low refractory to dextrose  administration.  He was intubated in the ED at [facility] and transferred to [facility], ICU for further care.  He is now on norepinephrine , epinephrine  and vasopressin  infusions.  He is sedated with fentanyl  and Versed .  Pertinent  Medical History  ESRD on HD Atrial fibs/flutter on Eliquis  Chronic HFrEF EF less than 20%   Significant Hospital Events: Including procedures, antibiotic start and stop dates in addition to other pertinent events     Interim History / Subjective:   O2 requirements rising.  Off sedation. Still on norepinephrine  and vasopressin . Epi on and off. Neurologically devastated.  Objective    Blood pressure (!) 111/59, pulse (!) 118, temperature 98.8 F (37.1 C), temperature source Axillary, resp. rate (!) 30, height 5' 7 (1.702 m), weight 89.3 kg, SpO2 99%.    Vent Mode: PRVC FiO2 (%):  [70 %-100 %] 100 % Set Rate:  [24 bmp] 24 bmp Vt Set:  [530 mL] 530 mL PEEP:  [5 cmH20] 5 cmH20 Plateau Pressure:  [18 cmH20-23 cmH20] 20 cmH20   Intake/Output Summary (Last 24 hours) at  1038 Last data filed at  1000 Gross per 24 hour  Intake 2630 ml  Output --  Net 2630 ml   Filed Weights   10/21/23 1100  Weight: 89.3 kg     Examination: Gen:      Intubated, sedated, acutely ill appearing HEENT:  ETT to vent, pupils sluggish, minimally reactive, anisocoria L>R Lungs:    sounds of mechanical ventilation auscultated no wheeze CV:         RRR Abd:      + bowel sounds; soft, non-tender; no palpable masses, no distension Ext:    No edema Skin:      Warm and dry; no rashes Neuro:   not sedated, RASS -5, does not have a cough or gag, no corneal reflex, no withdrawal from noxious stimuli, breathes over vent  Labs and images reviewed    Resolved problem list   Assessment and Plan   Out-of-hospital cardiac arrest with prolonged duration greater than 1 hour Cardiogenic shock Chronic HFrEF EF<20% - Unclear etiology for the arrest but he is clearly very chronically ill - Monitor electrolytes goal K over 4 mag over 2 - EKG obtained was irregular but he is now regular on telemetry. - Currently on 3 vasopressor support for goal systolic over 90  Concern for Anoxic Brain Injury - EEG negative 6/22 - hold all sedation, monitor response - Appears to be in vegetative state  Acute Hypoxemic respiratory failure:: In the setting of encephalopathy following cardiac arrest as well as worsening hypoxemia, mucous plugging, no cough unable to clear secretions -- PRVC, FiO2 100%, increase PEEP to 10 -- Not a candidate for PSV given high oxygen  requirement -- Ongoing goals of care plan for terminal extubation, comfort care later today per most recent documentation  Refractory hypoglycemia - continue dextrose  infusion  End stage renal disease on HD -Unable to tolerate HD or CRRT at this point given his multi vasopressor shock  Atrial fib/flutter - hold eliquis  - heparin  gtt   Best Practice (right click and Reselect all SmartList Selections daily)   Diet/type: NPO DVT prophylaxis systemic heparin  Pressure ulcer(s): pressure ulcer assessment deferred  GI prophylaxis: H2B Lines: Central line Foley:  Yes, and  it is still needed Code Status:  full code Last date of multidisciplinary goals of care discussion maurene Doffing and sister updated over the phone 6/22 am - Goals of care - if seizures have been ruled out, Doffing would like to proceed with comfort measures as she says Sam never would have wanted to be in a vegetative state. She does not want him to suffer. Just wants time to process. Anticipate transition to comfort measures noon 6/23]  CRITICAL CARE Performed by: Donnice SAUNDERS Lenor Provencher   Total critical care time: 32 minutes  Critical care time was exclusive of separately billable procedures and treating other patients.  Critical care was necessary to treat or prevent imminent or life-threatening deterioration.  Critical care was time spent personally by me on the following activities: development of treatment plan with patient and/or surrogate as well as nursing, discussions with consultants, evaluation of patient's response to treatment, examination of patient, obtaining history from patient or surrogate, ordering and performing treatments and interventions, ordering and review of laboratory studies, ordering and review of radiographic studies, pulse oximetry and re-evaluation of patient's condition.      Donnice SAUNDERS Shequita Peplinski [facility]  10:38 AM  See AMION  If no response to pager , please call critical care on call (see AMION) until 7pm After 7:00 pm call Elink

## 2023-10-31 NOTE — Progress Notes (Signed)
Pt extubated per order to comfort care measures.

## 2023-10-31 NOTE — Procedures (Signed)
 Patient Name: Michael [name].  [name]: [id number]  Epilepsy Attending: Arlin MALVA Krebs  Referring Physician/Provider: Meade Verdon RAMAN, MD  Date: 10/22/2023  Duration: 22.26 mins  Patient history: 68yo M with ams. EEG to evaluate for seizure  Level of alertness: comatose  AEDs during EEG study: None  Technical aspects: This EEG study was done with scalp electrodes positioned according to the 10-20 International system of electrode placement. Electrical activity was reviewed with band pass filter of 1-70Hz , sensitivity of 7 uV/mm, display speed of 80mm/sec with a 60Hz  notched filter applied as appropriate. EEG data were recorded continuously and digitally stored.  Video monitoring was available and reviewed as appropriate.  Description: EEG showed continuous generalized background suppression. Hyperventilation and photic stimulation were not performed.     ABNORMALITY - Background suppression, generalized  IMPRESSION: This study is suggestive of profound diffuse encephalopathy. No seizures or epileptiform discharges were seen throughout the recording.  Michael [name]

## 2023-10-31 NOTE — Progress Notes (Signed)
 eLink Physician-Brief Progress Note Patient Name: Michael [name]. DOB: Mar 05, 1956 [name]: [id number]   Date of Service    HPI/Events of Note  Pt becoming asynchronous w/ vent, transitioning to comfort care @ noon today. BSRN asking for PRN Fent to help w/ the asynch.  Camera: Vent setting reviewed.   eICU Interventions  DNR.  Fentanyl  50 q2 hr prn rdered  Discussed with RN     Intervention Category Minor Interventions: Agitation / anxiety - evaluation and management  Jodelle ONEIDA Hutching , 3:04 AM

## 2023-10-31 NOTE — Progress Notes (Signed)
 Approximately 25ml of Morphine  gtt wasted by Josette SQUIBB, RN, and this RN

## 2023-10-31 NOTE — Progress Notes (Signed)
 Pt. w/ comfort care orders, expired  @ 1649. Family and MD aware. Death pronounced by Russell Pattee, RN + this RN.

## 2023-10-31 NOTE — Progress Notes (Signed)
 PHARMACY - ANTICOAGULATION CONSULT NOTE  Pharmacy Consult for heparin  Indication: atrial fibrillation  Patient Measurements: Height: 5' 7 (170.2 cm) Weight: 89.3 kg (196 lb 13.9 oz) IBW/kg (Calculated) : 66.1 HEPARIN  DW (KG): 84.6  Vital Signs: Temp: 101.3 F (38.5 C) (06/23 0353) Temp Source: Axillary (06/23 0353) BP: 107/52 (06/22 1927) Pulse Rate: 86 (06/23 0400)  Labs: Recent Labs    10/21/23 0213 10/21/23 0442 10/21/23 1105 10/21/23 1538 10/21/23 2014 10/22/23 0523 10/22/23 1154 10/22/23 1559  0317  HGB 13.0  --  12.4*   < >  --  13.4 13.3  --  12.2*  HCT 45.7  --  41.2   < >  --  43.1 39.0  --  38.3*  PLT 113*  --  163  --   --  153  --   --  155  APTT  --   --   --    < > 36 33  --  32 >200*  HEPARINUNFRC  --   --   --   --  0.98* 0.90*  --   --   --   CREATININE 7.32*  --  7.76*  --   --  8.27*  --   --   --   TROPONINIHS 152* 2,249*  --   --   --   --   --   --   --    < > = values in this interval not displayed.    Estimated Creatinine Clearance: 9.4 mL/min (A) (by C-G formula based on SCr of 8.27 mg/dL (H)).   Medical History: Past Medical History:  Diagnosis Date   Atrial arrhythmia    atrial tachycardia with variable AV conduction versus atypical aflutter 01/10/19, rate control (02/06/19)   Cataract    Dyspnea    on exertion   ESRD (end stage renal disease) (HCC)    Hemo- MWF, Polycystic kidney disease   Fatigue    History of kidney stones    removal of stone- cysto   Hyperlipidemia    Hyperparathyroidism, secondary renal (HCC)    Hypertension    Hypoxemia 12/12/2013   Nonischemic cardiomyopathy (HCC)    Er 25% 2015, 55 % 2013   OSA on CPAP    no longer using cpap   OSA on CPAP 03/24/2014   Pneumonia    2015ish   Prostate cancer [facility])    Ventricular tachycardia//Freq PVCs    Wears glasses     Assessment: 68yoM presenting for OOH arrest (PEA > VT > VF), transfer in from [facility]. History of aflutter on Eliquis  prior to admission  and ESRD on chronic HD. Pharmacy consulted for initiation of heparin  infusion while critically ill. Unknown last dose of Eliquis  timing. CBC okay, no known bleeding at this time.  PM: aPTT supra-therapeutic at >200 seconds after heparin  moved to central line given possible infiltration with PIV. No issues with the heparin  drip running continuously, no pauses/interruptions per RN. No overt bleeding reported. Level drawn appropriately  Goal of Therapy:  Heparin  level 0.3-0.7 units/ml aPTT 66-102 seconds Monitor platelets by anticoagulation protocol: Yes   Plan:  Hold heparin  for 1 hour Decrease heparin  infusion to 1500 units/hr Continue to monitor H&H and platelets  Lynwood Poplar, PharmD, BCPS Clinical Pharmacist  4:20 AM

## 2023-10-31 DEATH — deceased

## 2024-01-03 ENCOUNTER — Other Ambulatory Visit: Payer: Self-pay | Admitting: Internal Medicine
